# Patient Record
Sex: Female | Born: 1955 | Race: White | Hispanic: No | Marital: Married | Smoking: Former smoker
Health system: Southern US, Community
[De-identification: ages and names within clinical notes are randomized; demographics above are authoritative.]

## PROBLEM LIST (undated history)

## (undated) DIAGNOSIS — I639 Cerebral infarction, unspecified: Secondary | ICD-10-CM

## (undated) DIAGNOSIS — I10 Essential (primary) hypertension: Secondary | ICD-10-CM

## (undated) DIAGNOSIS — M1990 Unspecified osteoarthritis, unspecified site: Secondary | ICD-10-CM

## (undated) DIAGNOSIS — R768 Other specified abnormal immunological findings in serum: Secondary | ICD-10-CM

## (undated) DIAGNOSIS — D735 Infarction of spleen: Secondary | ICD-10-CM

## (undated) DIAGNOSIS — I5032 Chronic diastolic (congestive) heart failure: Secondary | ICD-10-CM

## (undated) DIAGNOSIS — J449 Chronic obstructive pulmonary disease, unspecified: Secondary | ICD-10-CM

## (undated) DIAGNOSIS — Z9889 Other specified postprocedural states: Secondary | ICD-10-CM

## (undated) DIAGNOSIS — D649 Anemia, unspecified: Secondary | ICD-10-CM

## (undated) DIAGNOSIS — J189 Pneumonia, unspecified organism: Secondary | ICD-10-CM

## (undated) DIAGNOSIS — E785 Hyperlipidemia, unspecified: Secondary | ICD-10-CM

## (undated) DIAGNOSIS — E119 Type 2 diabetes mellitus without complications: Secondary | ICD-10-CM

## (undated) DIAGNOSIS — R011 Cardiac murmur, unspecified: Secondary | ICD-10-CM

## (undated) DIAGNOSIS — I330 Acute and subacute infective endocarditis: Secondary | ICD-10-CM

## (undated) DIAGNOSIS — I2729 Other secondary pulmonary hypertension: Secondary | ICD-10-CM

## (undated) DIAGNOSIS — F410 Panic disorder [episodic paroxysmal anxiety] without agoraphobia: Secondary | ICD-10-CM

## (undated) DIAGNOSIS — G43909 Migraine, unspecified, not intractable, without status migrainosus: Secondary | ICD-10-CM

## (undated) DIAGNOSIS — Z8719 Personal history of other diseases of the digestive system: Secondary | ICD-10-CM

## (undated) DIAGNOSIS — I609 Nontraumatic subarachnoid hemorrhage, unspecified: Secondary | ICD-10-CM

---
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
## 1989-07-24 HISTORY — PX: DILATION AND CURETTAGE OF UTERUS: SHX78

## 1999-09-23 ENCOUNTER — Emergency Department (HOSPITAL_COMMUNITY): Admission: EM | Admit: 1999-09-23 | Discharge: 1999-09-23 | Payer: Self-pay | Admitting: Emergency Medicine

## 2000-07-22 ENCOUNTER — Emergency Department (HOSPITAL_COMMUNITY): Admission: EM | Admit: 2000-07-22 | Discharge: 2000-07-22 | Payer: Self-pay | Admitting: Emergency Medicine

## 2004-06-16 ENCOUNTER — Ambulatory Visit (HOSPITAL_COMMUNITY): Admission: RE | Admit: 2004-06-16 | Discharge: 2004-06-16 | Payer: Self-pay | Admitting: *Deleted

## 2004-06-16 ENCOUNTER — Encounter (INDEPENDENT_AMBULATORY_CARE_PROVIDER_SITE_OTHER): Payer: Self-pay | Admitting: Specialist

## 2005-09-24 ENCOUNTER — Ambulatory Visit: Payer: Self-pay | Admitting: Family Medicine

## 2005-09-24 ENCOUNTER — Inpatient Hospital Stay (HOSPITAL_COMMUNITY): Admission: EM | Admit: 2005-09-24 | Discharge: 2005-09-26 | Payer: Self-pay | Admitting: Emergency Medicine

## 2005-09-25 ENCOUNTER — Ambulatory Visit: Payer: Self-pay | Admitting: Internal Medicine

## 2009-11-22 ENCOUNTER — Emergency Department (HOSPITAL_COMMUNITY): Admission: EM | Admit: 2009-11-22 | Discharge: 2009-11-22 | Payer: Self-pay | Admitting: Emergency Medicine

## 2009-11-30 ENCOUNTER — Emergency Department (HOSPITAL_COMMUNITY): Admission: EM | Admit: 2009-11-30 | Discharge: 2009-11-30 | Payer: Self-pay | Admitting: Emergency Medicine

## 2010-04-17 ENCOUNTER — Emergency Department (HOSPITAL_COMMUNITY): Admission: EM | Admit: 2010-04-17 | Discharge: 2010-04-17 | Payer: Self-pay | Admitting: Emergency Medicine

## 2010-06-25 ENCOUNTER — Observation Stay (HOSPITAL_COMMUNITY): Admission: EM | Admit: 2010-06-25 | Discharge: 2010-06-25 | Payer: Self-pay | Admitting: Emergency Medicine

## 2010-08-09 ENCOUNTER — Emergency Department (HOSPITAL_COMMUNITY): Admission: EM | Admit: 2010-08-09 | Discharge: 2010-08-09 | Payer: Self-pay | Admitting: Emergency Medicine

## 2011-02-06 LAB — POCT I-STAT, CHEM 8
BUN: 11 mg/dL (ref 6–23)
Calcium, Ion: 1.2 mmol/L (ref 1.12–1.32)
Chloride: 103 mEq/L (ref 96–112)
Creatinine, Ser: 0.8 mg/dL (ref 0.4–1.2)
Glucose, Bld: 119 mg/dL — ABNORMAL HIGH (ref 70–99)
HCT: 50 % — ABNORMAL HIGH (ref 36.0–46.0)
Hemoglobin: 17 g/dL — ABNORMAL HIGH (ref 12.0–15.0)
Potassium: 3.6 mEq/L (ref 3.5–5.1)
Sodium: 139 mEq/L (ref 135–145)
TCO2: 28 mmol/L (ref 0–100)

## 2011-02-23 LAB — BASIC METABOLIC PANEL
BUN: 9 mg/dL (ref 6–23)
CO2: 28 mEq/L (ref 19–32)
Calcium: 9.5 mg/dL (ref 8.4–10.5)
Chloride: 102 mEq/L (ref 96–112)
Creatinine, Ser: 0.78 mg/dL (ref 0.4–1.2)
GFR calc Af Amer: >60 mL/min (ref >60)
GFR calc non Af Amer: >60 mL/min (ref >60)
Glucose, Bld: 103 mg/dL — ABNORMAL HIGH (ref 70–99)
Potassium: 4.2 mEq/L (ref 3.5–5.1)
Sodium: 137 mEq/L (ref 135–145)

## 2011-02-23 LAB — DIFFERENTIAL
Basophils Absolute: 0 10*3/uL (ref 0.0–0.1)
Basophils Relative: 0 % (ref 0–1)
Eosinophils Absolute: 0.1 10*3/uL (ref 0.0–0.7)
Eosinophils Relative: 1 % (ref 0–5)
Lymphocytes Relative: 8 % — ABNORMAL LOW (ref 12–46)
Lymphs Abs: 0.7 10*3/uL (ref 0.7–4.0)
Monocytes Absolute: 0.4 10*3/uL (ref 0.1–1.0)
Monocytes Relative: 4 % (ref 3–12)
Neutro Abs: 8.6 10*3/uL — ABNORMAL HIGH (ref 1.7–7.7)
Neutrophils Relative %: 87 % — ABNORMAL HIGH (ref 43–77)

## 2011-02-23 LAB — CBC
HCT: 47.4 % — ABNORMAL HIGH (ref 36.0–46.0)
Hemoglobin: 15.7 g/dL — ABNORMAL HIGH (ref 12.0–15.0)
MCHC: 33.1 g/dL (ref 30.0–36.0)
MCV: 84 fL (ref 78.0–100.0)
Platelets: 267 10*3/uL (ref 150–400)
RBC: 5.64 MIL/uL — ABNORMAL HIGH (ref 3.87–5.11)
RDW: 16.5 % — ABNORMAL HIGH (ref 11.5–15.5)
WBC: 9.9 10*3/uL (ref 4.0–10.5)

## 2011-04-10 NOTE — Op Note (Signed)
NAME:  Margaret [name], Margaret [name]                    ACCOUNT NO.:  [id number]   MEDICAL RECORD NO.:  [id number]                   PATIENT TYPE:  AMB   LOCATION:  SDC                                  FACILITY:  [facility]   PHYSICIAN:  Kendall West B. Earlene Plater, M.D.               DATE OF BIRTH:  1955-12-10   DATE OF PROCEDURE:  06/16/2004  DATE OF DISCHARGE:                                 OPERATIVE REPORT   PREOPERATIVE DIAGNOSIS:  Severe dysplasia with positive endocervical  curettage.   POSTOPERATIVE DIAGNOSIS:  Severe dysplasia with positive endocervical  curettage.   OPERATION PERFORMED:  Cold knife conization of the cervix.   SURGEON:  Chester Holstein. Earlene Plater, M.D.   ANESTHESIA:  LMA with 20 mL of 1% lidocaine with epinephrine paracervical  block.   SPECIMENS:  Cervical cone submitted in two pieces anterior portion labeled  12 o'clock, posterior portion labeled 6 o'clock.   ESTIMATED BLOOD LOSS:  50 mL.   COMPLICATIONS:  None.   INDICATIONS FOR PROCEDURE:  Patient with a recent abnormal Pap smear in the  office.  Found to have biopsy proven severe dysplasia and a positive  endocervical curettage.  Presents for cold knife conization given her age  and the positive ECC.   DESCRIPTION OF PROCEDURE:  The patient was taken to the operating room and  general laryngeal mask anesthesia obtained.  She was prepped in standard  fashion and the vagina was only gently prepped to not denude the cervix.  The bladder was emptied with a red rubber catheter.  Speculum inserted and  the paracervical block placed in standard fashion.  Stay sutures placed at 6  and 9 o'clock.  The uterus was sounded and found to be midposition and  normal size.  No gross lesions were noted.  The cervix was stained with  Lugol and a rim of abnormal staining noted at the transition zone.   The abnormally stained portion plus a margin of normal-appearing tissue was  excised with the knife.  There were two large nabothian cysts at the  cervix  which were entered during the cone and this made the cone specimen divide in  two separate pieces.  Each was excised sharply with the scissors and labeled  appropriately.   The cone bed was made hemostatic with ball cautery and a Gelfoam soaked in  Monsel's placed in the cone bed.  The cervix was hemostatic.  The patient  tolerated the procedure well.  There were no complications.  She was taken  to the recovery room, awake, alert and in stable condition.                                              Gerri Spore B. Earlene Plater, M.D.   WBD/MEDQ  D:  06/16/2004  T:  06/16/2004  Job:  [id number]

## 2011-04-10 NOTE — H&P (Signed)
[name], [name]          ACCOUNT NO.:  [id number]   MEDICAL RECORD NO.:  [id number]          PATIENT TYPE:  INP   LOCATION:  3709                         FACILITY:  [facility]   PHYSICIAN:  Asencion Partridge, M.D.     DATE OF BIRTH:  [birth year]   DATE OF ADMISSION:  09/24/2005  DATE OF DISCHARGE:                                HISTORY & PHYSICAL   CHIEF COMPLAINT:  Chest pain, shortness of breath, dyspnea on exertion.   HISTORY OF PRESENT ILLNESS:  The patient is a 55 year old female with the  medical history outlined below with increased shortness of breath over the  past few days. She had a 15-minute episode of chest pain that was  substernal. It radiated to her back and was associated with nausea. This  occurred last p.m. She has had an inability to lie flat at night. This has  been a recent change for her. She has had decreased exercise tolerance. She  went to Urgent Care and diagnosed with a new murmur and she came to [facility] for evaluation and treatment. After receiving Lasix  60 mg in the ED she had significant decrease in her shortness of breath. At  the time I evaluated her she was feeling significantly better.   PAST MEDICAL HISTORY:  1.  Hypertension.  2.  Hyperlipidemia.  3.  Obesity.  4.  Heartburn.  5.  History of anxiety attack.   SOCIAL HISTORY:  Tobacco:  One pack per day x9 years. No alcohol, no drugs.  She works as a Automotive engineer. She lives with her husband and daughter.   FAMILY HISTORY:  Her father died of a CVA. Her mother had a heart attack at  age 57 and died at age 46 of an MI. There is also a family history of  diabetes.   MEDICATIONS:  None.   ALLERGIES:  CODEINE causes nausea and itching.   REVIEW OF SYSTEMS:  No rash, no fevers, no chills, no blood in stool. She  does have heavy periods with cramping with menses. No vomiting. She does  have chest pain and shortness of breath as above. She has had no changes in  urination. She has had also a 2- to 3-year history of lower extremity edema.   PHYSICAL EXAMINATION:  VITAL SIGNS:  Noted at temperature 97.2, heart rate  80, respirations 21, blood pressure 165/94, O2 saturations 98% on 2 L. When  I examined her she was off oxygen.  GENERAL:  No apparent distress.  HEENT:  Normocephalic, atraumatic. Pupils equally round and reactive to  light. Mucous membranes are moist.  NECK:  No lymphadenopathy, no thyromegaly appreciated.  CARDIOVASCULAR:  Regular rate and rhythm. She does have a murmur at the left  sternal border over the mitral and aortic valve areas.  PULMONARY:  She has decreased breath sounds at the bases bilaterally. She  has no wheezes. She has coarse breath sounds.  ABDOMEN:  Soft, nontender, nondistended, positive bowel sounds, no rebound.  EXTREMITIES:  Show 2+ pulses with 1+ pedal edema.  SKIN:  No rash.   LABORATORY VALUES:  Initial cardiac  markers are negative. Hemoglobin is  noted at 9.3 with MCV of 65.3. INR is 1, PTT is 26. D-dimer is 1.24. BNP is  127. An i-STAT creatinine is 0.9. Other laboratory values are pending. EKG  shows normal sinus rhythm with no ST changes. CT shows no PE. She has  bilateral pleural effusions with mild atelectasis and mild pulmonary edema.  Chest x-ray shows borderline cardiomegaly.   ASSESSMENT AND PLAN:  The patient is a 55 year old female with the following  problems:  1.  Chest pain/shortness of breath. This of high concern given her history      for cardiac disease. She is obese, she has hypertension, she has a      positive family history for MI and CVA. She also has hyperlipidemia and      she smokes. Therefore, will admit her and rule out an MI with enzymes      and serial EKGs. Shortness of breath likely due to the pleural effusions      which is likely due to the cardiac issue. I will treat this with Lasix.      Will evaluate her lipids and A1c and start aspirin. Will admit her to a       telemetry bed.  2.  Hypertension. Will start hydrochlorothiazide. Depending on A1c, she may      be a candidate for an ACE. Of note, her creatinine is okay on the i-STAT      listed above.  3.  New murmur. Will check an echocardiogram and follow this. She does not      appear to be actively decompensating. It is stable for this not to be      emergently worked up; this can wait until tomorrow.  4.  Electrolytes. Will follow when available.  5.  Deep venous thrombosis prophylaxis with Lovenox.  6.  Fluids, electrolytes, and nutrition. Begin a diabetic diet.  7.  Tobacco. Will replace her nicotine deficit with patch and encourage      cessation.  8.  Disposition. Pending #1.      Margaret [name], M.D.    ______________________________  Asencion Partridge, M.D.    GSD/MEDQ  D:  09/24/2005  T:  09/25/2005  Job:  [id number]   cc:   Urgent Care at [facility]

## 2011-04-10 NOTE — Discharge Summary (Signed)
NAME:  Margaret [name], Margaret [name]             ACCOUNT NO.:  [facility]   MEDICAL RECORD NO.:  [id number]          PATIENT TYPE:  INP   LOCATION:  3709                         FACILITY:  [facility]   PHYSICIAN:  Asencion Partridge, M.D.     DATE OF BIRTH:  December 28, 1955   DATE OF ADMISSION:  09/24/2005  DATE OF DISCHARGE:  09/26/2005                                 DISCHARGE SUMMARY   PRIMARY CARE PHYSICIAN:  Jonita Albee, M.D.   DISCHARGE DIAGNOSES:  1.  Chest pain. Rule out myocardial infarction.  2.  Hypertension.  3.  Hyperlipidemia.  4.  Tobacco abuse.  5.  Iron-deficiency anemia.   PROCEDURE:  1.  Chest x-ray dated September 24, 2005, showed borderline cardiomegaly with      diffuse interstitial and bibasilar alveolar opacity and small bilateral      pleural effusions. Also shows a fullness in the right hilum. Adenopathy      is not excluded. Recommend follow-up imaging in the future after the      resolution of her acute symptoms.  2.  CT angiogram of the chest, dated September 24, 2005, showed bilateral      pleural effusions with bibasilar atelectasis and probable edema. There      is no evidence for pulmonary emboli. There is borderline mediastinal and      hilar lymph nodes.   LABORATORY DATA:  Notable laboratories are as follows: Hemoglobin of 10.5,  MCV 65.3, and an RDW of 18.4. Reticulocyte percentage of 2.5 with an  absolute reticulocyte count of 122.8. Iron studies are as follows: Total  iron is 17 and low, total iron binding capacity 459, percent saturation is  4% and low. Ferritin is 13.   Fasting lipid panel shows triglycerides of 119, HDL 35, LDL 113.   Hemoglobin A1C is 5.7. BNP on admission was 127.8 and mildly elevated.   Point of care enzymes were negative times in the emergency department and  then regular cardiac enzymes were as follows: Creatine kinase was 163, 126,  132; CK 2.5, 2.7, 1.7; troponin 0.01, 0.02, 0.02 and stable.   The patient had normal liver function tests in  the hospital and a creatinine  of 0.8 at discharge.   D-dimer in the hospital was 1.24 on admission and this was evaluated with a  CT angiogram of the chest which was negative.   HISTORY AND PHYSICAL:  Please see a copy of the dictated history and  physical on the chart for details, but in short the patient is a 55 year old  Caucasian female with a past medical history for hypertension,  hyperlipidemia, obesity, tobacco abuse, and also a family history of a  mother with a heart attack at age 71 who died of a myocardial infarction at  age 91, who presented to the emergency department  with atypical chest pain  and shortness of breath. Workup included CT angiogram to rule out pulmonary  emboli and a chest x-ray which showed bilateral pleural effusions. The  patient was admitted to the hospital to complete rule out for myocardial  infarction and also to pursue a 2-D  echo of the chest to evaluate ejection  fraction given the fact that she had signs of pulmonary edema and bilateral  pleural effusion.   HOSPITAL COURSE:   PROBLEM #1:  Chest pain. Rule out myocardial infarction. The patient was  admitted to the hospital and had serial EKGs and cardiac enzymes q.8h. times  three. Cardiac enzymes were negative times three and EKG showed no acute  changes. An acute myocardial infarction was therefore ruled out. The patient  was pain free throughout the hospitalization and was pain free on the day of  discharge and asking to home. Given the patient's significant risk factors,  it is felt that we would appreciate Dr. Perrin Maltese, the [facility], would schedule the patient for an outpatient cardiac workup  such as a stress Cardiolite. I have advised the patient to take a baby  aspirin every day and to also counsel her as to smoking cessation.   PROBLEM #2:  Pulmonary edema. The patient was diuresed effectively with  Lasix 40 mg daily. This was continued throughout the  hospitalization and the  patient maintained a stable body weight and was asymptomatic on the 40 mg of  Lasix throughout the hospitalization. This will be continued at the time of  discharge. Would appreciate the physicians at urgent care will follow up her  BNP at her follow-up appointment to assess her potassium and also her  creatinine. I would also appreciate if they would determine whether or not  the patient needs to be continued on the Lasix long-term. I would also  appreciate if the physicians at [facility] would also follow up the  results of the 2-D echocardiogram of the heart. The patient on admission was  noted to have a 3/6 systolic ejection murmur heard best over the base. The  patient states she had not been informed of this murmur in the past. An  echocardiogram was obtained to: (1) Evaluate her ejection fraction given her  bilateral pleural effusions and pulmonary edema, and (2) to evaluate the  etiology of this murmur. It is our suspicion that the murmur is likely  related to her anemia as she was found to have a microcytic anemia on  admission. Depending on the echocardiogram the patient may also benefit from  an ACE inhibitor in the future, but will leave that to the discretion of Dr.  Perrin Maltese at his outpatient follow-up.   PROBLEM #3:  Tobacco abuse. The patient was counseled as to tobacco  cessation and will start on a nicotine which she was given for a  prescription for at discharge.   PROBLEM #4:  Iron-deficiency anemia. The patient had a microcytic anemia  with iron studies consistent with iron-deficiency anemia. She was discharged  home on iron sulfate 325 mg one p.o. b.i.d. and instructed to follow up with  her regular doctor.   PROBLEM #5:  Hypertension. The patient was continued on her  hydrochlorothiazide if discharged. Her blood pressures were in the range of  122 to 136 over 84 to 85. May consider adding an ACE inhibitor at the outpatient followup  depending on the results of the 2-D echocardiogram of  the heart.   PROBLEM #6:  Hyperlipidemia. The patient was discharged home on Zocor 20 mg  one p.o. q.h.s. her goal LDL will be less than 130 given her family history  of coronary artery disease and also her multiple risk factors. Currently her  LDL is 113 and at goal. Her triglycerides  are also at goal. Her HDL of 35  could stand improvement. Consider adding niacin to the statin as an  outpatient and will leave that to the discretion of Dr. Perrin Maltese.   DISCHARGE MEDICATIONS:  1.  Aspirin 81 mg p.o. daily.  2.  Hydrochlorothiazide 25 mg one p.o. daily.  3.  Nicotine patch 14 mg apply one patch daily.  4.  Lasix 40 mg one p.o. daily.  5.  Iron sulfate 325 mg one p.o. b.i.d.  6.  Zocor 20 mg one p.o. q.h.s.   FOLLOWUP INSTRUCTIONS:  The patient is instructed to call [facility]  and to arrange a follow-up appointment on Tuesday or Wednesday, November 8th  or 9th to discuss her findings with Dr. Katrinka Blazing. Would appreciate at that time  Dr. Perrin Maltese or Dr. Katrinka Blazing follow up the results of the 2-D echocardiogram,  schedule the patient for an outpatient stress Cardiolite if they deem  appropriate, and also determine whether or not the patient needs to maintain  Lasix long-term.      Broadus John T. Pamalee Leyden, MD    ______________________________  Asencion Partridge, M.D.    WTP/MEDQ  D:  09/26/2005  T:  09/26/2005  Job:  [id number]   cc:   Jonita Albee, M.D.  Fax: [phone]

## 2012-04-20 ENCOUNTER — Encounter (HOSPITAL_COMMUNITY): Payer: Self-pay | Admitting: Emergency Medicine

## 2012-04-20 ENCOUNTER — Emergency Department (HOSPITAL_COMMUNITY): Payer: Self-pay

## 2012-04-20 ENCOUNTER — Inpatient Hospital Stay (HOSPITAL_COMMUNITY)
Admission: EM | Admit: 2012-04-20 | Discharge: 2012-04-22 | DRG: 191 | Disposition: A | Discharge status: Home / Self Care | Payer: Self-pay | Source: Ambulatory Visit | Attending: Internal Medicine | Admitting: Internal Medicine

## 2012-04-20 DIAGNOSIS — F410 Panic disorder [episodic paroxysmal anxiety] without agoraphobia: Secondary | ICD-10-CM

## 2012-04-20 DIAGNOSIS — I10 Essential (primary) hypertension: Secondary | ICD-10-CM

## 2012-04-20 DIAGNOSIS — E119 Type 2 diabetes mellitus without complications: Secondary | ICD-10-CM | POA: Diagnosis present

## 2012-04-20 DIAGNOSIS — E785 Hyperlipidemia, unspecified: Secondary | ICD-10-CM | POA: Diagnosis present

## 2012-04-20 DIAGNOSIS — I509 Heart failure, unspecified: Secondary | ICD-10-CM

## 2012-04-20 DIAGNOSIS — E669 Obesity, unspecified: Secondary | ICD-10-CM | POA: Diagnosis present

## 2012-04-20 DIAGNOSIS — Z6841 Body Mass Index (BMI) 40.0 and over, adult: Secondary | ICD-10-CM

## 2012-04-20 DIAGNOSIS — J441 Chronic obstructive pulmonary disease with (acute) exacerbation: Principal | ICD-10-CM | POA: Diagnosis present

## 2012-04-20 DIAGNOSIS — F17200 Nicotine dependence, unspecified, uncomplicated: Secondary | ICD-10-CM | POA: Diagnosis present

## 2012-04-20 DIAGNOSIS — Z7982 Long term (current) use of aspirin: Secondary | ICD-10-CM

## 2012-04-20 DIAGNOSIS — R0902 Hypoxemia: Secondary | ICD-10-CM

## 2012-04-20 DIAGNOSIS — J438 Other emphysema: Secondary | ICD-10-CM

## 2012-04-20 DIAGNOSIS — Z79899 Other long term (current) drug therapy: Secondary | ICD-10-CM

## 2012-04-20 DIAGNOSIS — R062 Wheezing: Secondary | ICD-10-CM

## 2012-04-20 DIAGNOSIS — J449 Chronic obstructive pulmonary disease, unspecified: Secondary | ICD-10-CM

## 2012-04-20 DIAGNOSIS — R0602 Shortness of breath: Secondary | ICD-10-CM

## 2012-04-20 DIAGNOSIS — R609 Edema, unspecified: Secondary | ICD-10-CM

## 2012-04-20 HISTORY — DX: Hyperlipidemia, unspecified: E78.5

## 2012-04-20 HISTORY — DX: Panic disorder (episodic paroxysmal anxiety): F41.0

## 2012-04-20 HISTORY — DX: Anemia, unspecified: D64.9

## 2012-04-20 HISTORY — DX: Migraine, unspecified, not intractable, without status migrainosus: G43.909

## 2012-04-20 HISTORY — DX: Type 2 diabetes mellitus without complications: E11.9

## 2012-04-20 HISTORY — DX: Cardiac murmur, unspecified: R01.1

## 2012-04-20 HISTORY — DX: Chronic obstructive pulmonary disease, unspecified: J44.9

## 2012-04-20 HISTORY — DX: Personal history of other diseases of the digestive system: Z87.19

## 2012-04-20 HISTORY — DX: Essential (primary) hypertension: I10

## 2012-04-20 LAB — CBC
HCT: 49.6 % — ABNORMAL HIGH (ref 36.0–46.0)
Hemoglobin: 16.7 g/dL — ABNORMAL HIGH (ref 12.0–15.0)
MCH: 31.7 pg (ref 26.0–34.0)
MCHC: 33.7 g/dL (ref 30.0–36.0)
MCV: 94.1 fL (ref 78.0–100.0)
Platelets: 219 10*3/uL (ref 150–400)
RBC: 5.27 MIL/uL — ABNORMAL HIGH (ref 3.87–5.11)
RDW: 14.9 % (ref 11.5–15.5)

## 2012-04-20 LAB — BASIC METABOLIC PANEL
BUN: 12 mg/dL (ref 6–23)
CO2: 26 mEq/L (ref 19–32)
Calcium: 9.3 mg/dL (ref 8.4–10.5)
Chloride: 101 mEq/L (ref 96–112)
Creatinine, Ser: 0.81 mg/dL (ref 0.50–1.10)
GFR calc Af Amer: >90 mL/min (ref >90)
GFR calc non Af Amer: 80 mL/min — ABNORMAL LOW (ref >90)
Glucose, Bld: 127 mg/dL — ABNORMAL HIGH (ref 70–99)
Sodium: 136 mEq/L (ref 135–145)

## 2012-04-20 LAB — DIFFERENTIAL
Basophils Absolute: 0 10*3/uL (ref 0.0–0.1)
Basophils Relative: 0 % (ref 0–1)
Eosinophils Absolute: 0.2 10*3/uL (ref 0.0–0.7)
Eosinophils Relative: 2 % (ref 0–5)
Lymphocytes Relative: 10 % — ABNORMAL LOW (ref 12–46)
Lymphs Abs: 0.9 10*3/uL (ref 0.7–4.0)
Monocytes Absolute: 0.3 10*3/uL (ref 0.1–1.0)
Monocytes Relative: 4 % (ref 3–12)
Neutro Abs: 8 10*3/uL — ABNORMAL HIGH (ref 1.7–7.7)
Neutrophils Relative %: 85 % — ABNORMAL HIGH (ref 43–77)

## 2012-04-20 LAB — PRO B NATRIURETIC PEPTIDE: Pro B Natriuretic peptide (BNP): 416.2 pg/mL — ABNORMAL HIGH (ref 0–125)

## 2012-04-20 MED ORDER — IPRATROPIUM BROMIDE 0.02 % IN SOLN
0.5000 mg | Freq: Once | RESPIRATORY_TRACT | Status: AC
  Administered 2012-04-20: 0.5 mg via RESPIRATORY_TRACT

## 2012-04-20 MED ORDER — ASPIRIN 81 MG PO CHEW
81.0000 mg | CHEWABLE_TABLET | Freq: Every day | ORAL | Status: DC
  Administered 2012-04-20 – 2012-04-22 (×3): 81 mg via ORAL
  Filled 2012-04-20 (×3): qty 1

## 2012-04-20 MED ORDER — IPRATROPIUM BROMIDE 0.02 % IN SOLN
0.5000 mg | Freq: Once | RESPIRATORY_TRACT | Status: AC
  Administered 2012-04-20: 0.5 mg via RESPIRATORY_TRACT
  Filled 2012-04-20: qty 2.5

## 2012-04-20 MED ORDER — FUROSEMIDE 10 MG/ML IJ SOLN
40.0000 mg | Freq: Once | INTRAMUSCULAR | Status: AC
  Administered 2012-04-20: 40 mg via INTRAVENOUS
  Filled 2012-04-20: qty 4

## 2012-04-20 MED ORDER — PREDNISONE 20 MG PO TABS
60.0000 mg | ORAL_TABLET | Freq: Once | ORAL | Status: AC
  Administered 2012-04-20: 60 mg via ORAL
  Filled 2012-04-20: qty 3

## 2012-04-20 MED ORDER — IPRATROPIUM BROMIDE 0.02 % IN SOLN
RESPIRATORY_TRACT | Status: AC
  Administered 2012-04-20: 0.5 mg via RESPIRATORY_TRACT
  Filled 2012-04-20: qty 2.5

## 2012-04-20 MED ORDER — GUAIFENESIN-DM 100-10 MG/5ML PO SYRP
5.0000 mL | ORAL_SOLUTION | ORAL | Status: DC | PRN
  Administered 2012-04-21: 5 mL via ORAL
  Filled 2012-04-20 (×2): qty 5

## 2012-04-20 MED ORDER — ALBUTEROL SULFATE (5 MG/ML) 0.5% IN NEBU
5.0000 mg | INHALATION_SOLUTION | Freq: Once | RESPIRATORY_TRACT | Status: AC
  Administered 2012-04-20: 5 mg via RESPIRATORY_TRACT

## 2012-04-20 MED ORDER — FUROSEMIDE 40 MG PO TABS
40.0000 mg | ORAL_TABLET | Freq: Every day | ORAL | Status: DC
  Administered 2012-04-21 – 2012-04-22 (×2): 40 mg via ORAL
  Filled 2012-04-20 (×3): qty 1

## 2012-04-20 MED ORDER — ALBUTEROL SULFATE (5 MG/ML) 0.5% IN NEBU
10.0000 mg | INHALATION_SOLUTION | Freq: Once | RESPIRATORY_TRACT | Status: AC
  Administered 2012-04-20: 10 mg via RESPIRATORY_TRACT
  Filled 2012-04-20: qty 0.5

## 2012-04-20 MED ORDER — FUROSEMIDE 20 MG PO TABS
20.0000 mg | ORAL_TABLET | Freq: Every day | ORAL | Status: DC
  Administered 2012-04-20 – 2012-04-22 (×3): 20 mg via ORAL
  Filled 2012-04-20 (×3): qty 1

## 2012-04-20 MED ORDER — OMEGA-3-ACID ETHYL ESTERS 1 G PO CAPS
1.0000 g | ORAL_CAPSULE | Freq: Every day | ORAL | Status: DC
  Administered 2012-04-20 – 2012-04-22 (×3): 1 g via ORAL
  Filled 2012-04-20 (×3): qty 1

## 2012-04-20 MED ORDER — ALBUTEROL SULFATE (5 MG/ML) 0.5% IN NEBU
5.0000 mg | INHALATION_SOLUTION | RESPIRATORY_TRACT | Status: DC | PRN
  Administered 2012-04-20: 5 mg via RESPIRATORY_TRACT
  Filled 2012-04-20: qty 1

## 2012-04-20 MED ORDER — OMEGA-3 FATTY ACIDS 1000 MG PO CAPS: 1.0000 g | ORAL_CAPSULE | Freq: Every day | ORAL | Status: DC

## 2012-04-20 MED ORDER — ALBUTEROL SULFATE (5 MG/ML) 0.5% IN NEBU
2.5000 mg | INHALATION_SOLUTION | Freq: Four times a day (QID) | RESPIRATORY_TRACT | Status: DC
  Administered 2012-04-20 – 2012-04-22 (×7): 2.5 mg via RESPIRATORY_TRACT
  Filled 2012-04-20 (×7): qty 0.5

## 2012-04-20 MED ORDER — FUROSEMIDE 20 MG PO TABS: 20.0000 mg | ORAL_TABLET | Freq: Two times a day (BID) | ORAL | Status: DC

## 2012-04-20 MED ORDER — ATENOLOL 50 MG PO TABS
50.0000 mg | ORAL_TABLET | Freq: Every day | ORAL | Status: DC
  Administered 2012-04-20: 50 mg via ORAL
  Filled 2012-04-20 (×2): qty 1

## 2012-04-20 MED ORDER — AZITHROMYCIN 500 MG PO TABS
500.0000 mg | ORAL_TABLET | Freq: Every day | ORAL | Status: DC
  Administered 2012-04-20 – 2012-04-22 (×3): 500 mg via ORAL
  Filled 2012-04-20: qty 2
  Filled 2012-04-20 (×2): qty 1

## 2012-04-20 MED ORDER — TIOTROPIUM BROMIDE MONOHYDRATE 18 MCG IN CAPS
18.0000 ug | ORAL_CAPSULE | Freq: Every day | RESPIRATORY_TRACT | Status: DC
  Administered 2012-04-21 – 2012-04-22 (×2): 18 ug via RESPIRATORY_TRACT
  Filled 2012-04-20: qty 5

## 2012-04-20 MED ORDER — CLONIDINE HCL 0.1 MG PO TABS: 0.1000 mg | ORAL_TABLET | Freq: Every day | ORAL | Status: DC

## 2012-04-20 MED ORDER — ACETAMINOPHEN 325 MG PO TABS: 650.0000 mg | ORAL_TABLET | ORAL | Status: DC | PRN

## 2012-04-20 MED ORDER — ALBUTEROL SULFATE (5 MG/ML) 0.5% IN NEBU
5.0000 mg | INHALATION_SOLUTION | Freq: Once | RESPIRATORY_TRACT | Status: AC
  Administered 2012-04-20: 5 mg via RESPIRATORY_TRACT
  Filled 2012-04-20: qty 1

## 2012-04-20 MED ORDER — ALBUTEROL SULFATE (5 MG/ML) 0.5% IN NEBU
INHALATION_SOLUTION | RESPIRATORY_TRACT | Status: AC
  Administered 2012-04-20: 5 mg via RESPIRATORY_TRACT
  Filled 2012-04-20: qty 1.5

## 2012-04-20 MED ORDER — PREDNISONE 50 MG PO TABS
60.0000 mg | ORAL_TABLET | Freq: Every day | ORAL | Status: DC
  Administered 2012-04-20: 60 mg via ORAL
  Filled 2012-04-20: qty 3
  Filled 2012-04-20 (×2): qty 1

## 2012-04-20 NOTE — ED Notes (Signed)
Pt stated that earlier today she begin having a difficult time breathing, She felt like she could not catch her breath. She stated that she was having midsternal CP and bilateral rib pain during this time. EMS came out the first time and they gave her breathing treatment. She stated that at that time she did not go to the ED. A couple of hours later, she had another attack with the same signs and symptoms. She called EMS  And they brought her to the ED.  No radiation of pain. No n/v. Diaphoresis present. Currently pt states that she feels better. No respiratory distress. No CP or rib pain. Will continue to monitor.

## 2012-04-20 NOTE — ED Notes (Signed)
Respirations even & unlabored, no distress after some rest. Denies SOB. Increase in POX to 95%

## 2012-04-20 NOTE — ED Provider Notes (Signed)
Medical screening examination/treatment/procedure(s) were performed by non-physician practitioner and as supervising physician I was immediately available for consultation/collaboration.  Cheri Guppy, MD 04/20/12 [phone]

## 2012-04-20 NOTE — H&P (Signed)
. History and Physical  Margaret [name] RUE:[id number] DOB: 03-30-1956 DOA: 04/20/2012  Referring physician: PCP: Quentin Mulling, MD, MD   Chief Complaint: SOB  HPI:  56 yr old female was well till /29 afternoon and had window open.  She thinks the pollen caused there to start having cough/sneeze.  Took an allergy medicine and became sob with laboured breathing and felt exhausted and went to Dr. Charlane Ferretti office for evaluation and as he was backed up, she went HOme without being seen and then awoke 21:00 last pmand couldn;t breath.  It was laboured, she was gasping for air and this giot worse and worse and her husband called EMS and she was given O2 and had a panic attack apparently from this and she calmed a little and stopped hyperventilating and felt better.   EMS left and about 3 hours later she had increasing SOB once again and EMS came out-She was given breathing Rx on way to Hospital and also given Oxygen and another breathing Rx and had 2 more here and still SOB with some desats so referred for admit.  Has been told she has asthma-had this as an adult-she has never had Pulmonology eval Has some seasonal allergies   Has rib soreness, no CP.Was assosc with = some heaviness in chest earlier which relieved itself with breathing Rx-no radiation to arm or neck, no h/o HA, Has had some LE swelling which is constant-not mor ethna usual-has gained some water weight , has h/o heart murmu.  No fever or chills that she can defintively mention-feels subjectively warm, +headache today,no blurred vision or double cvision, no dyuria, no dark stool or tyarry stool, no abd pain, no rash, Has a h/o fellow employees who have been sick-no sick children in house hold  Chart Review:  See above  H/o Pulm edema-had an echo 2006 admit but no report in Epic  H/o tobacco abuse  H/o iron def anemia  HLD  HTN stage 2  Review of Systems:  See above  Past Medical History  Diagnosis Date  .  Hypertension   . Diabetes mellitus     History reviewed. No pertinent past surgical history.  Social History:  reports that she has been smoking.  She does not have any smokeless tobacco history on file. She reports that she does not drink alcohol or use illicit drugs.  Allergies  Allergen Reactions  . Codeine Hives    History reviewed. No pertinent family history.   Prior to Admission medications   Medication Sig Start Date End Date Taking? Authorizing Provider  albuterol (PROVENTIL HFA;VENTOLIN HFA) 108 (90 BASE) MCG/ACT inhaler Inhale into the lungs every 6 (six) hours as needed. For wheezing and shortness of breathe   Yes Historical Provider, MD  aspirin 81 MG chewable tablet Chew 81 mg by mouth daily.   Yes Historical Provider, MD  atenolol (TENORMIN) 50 MG tablet Take 50 mg by mouth daily.   Yes Historical Provider, MD  cloNIDine (CATAPRES) 0.1 MG tablet Take 0.1 mg by mouth daily.   Yes Historical Provider, MD  fish oil-omega-3 fatty acids 1000 MG capsule Take 1 g by mouth daily.   Yes Historical Provider, MD  furosemide (LASIX) 40 MG tablet Take 20-40 mg by mouth 2 (two) times daily. Take one tablet in the morning and one-half a tablet in the evening   Yes Historical Provider, MD   Physical Exam: Filed Vitals:   04/20/12 1147 04/20/12 1300 04/20/12 1417 04/20/12 1418  BP:  147/65  Pulse:  78    Temp:      TempSrc:      Resp:  22    SpO2: 96% 96% 92% 83%     General:  Alert pleasant CF obese, no pallor/ict  Eyes: no ict/pallor  ENT: no lymphad, ear TM's neg, no turbinate swelling  Neck: soft, supple, no JVD no bruit  Cardiovascular: s1 s2 no m/r/g-tele NSR  Respiratory: Exp wheezes heard, no TVR/TVF  Abdomen: obwese, nt/nd  Skin: no rash, grade 2 LE edema  Musculoskeletal: rom normal  Psychiatric: euthymic  Neurologic: intact   Labs on Admission:  Basic Metabolic Panel:  Lab 04/20/12 4098  NA 136  K 3.9  CL 101  CO2 26  GLUCOSE 127*  BUN 12   CREATININE 0.81  CALCIUM 9.3  MG --  PHOS --    Liver Function Tests: No results found for this basename: AST:5,ALT:5,ALKPHOS:5,BILITOT:5,PROT:5,ALBUMIN:5 in the last 168 hours No results found for this basename: LIPASE:5,AMYLASE:5 in the last 168 hours No results found for this basename: AMMONIA:5 in the last 168 hours  CBC:  Lab 04/20/12 0344  WBC 9.5  NEUTROABS 8.0*  HGB 16.7*  HCT 49.6*  MCV 94.1  PLT 219    Cardiac Enzymes: No results found for this basename: CKTOTAL:5,CKMB:5,CKMBINDEX:5,TROPONINI:5 in the last 168 hours  Troponin (Point of Care Test) No results found for this basename: TROPIPOC in the last 72 hours  BNP (last 3 results)  Basename 04/20/12 0358  PROBNP 416.2*    CBG: No results found for this basename: GLUCAP:5 in the last 168 hours   Radiological Exams on Admission: Dg Chest Port 1 View  04/20/2012  *RADIOLOGY REPORT*  Clinical Data: Shortness of breath, cough and chest tightness.  PORTABLE CHEST - 1 VIEW  Comparison: 06/24/2010  Findings: Cardiac enlargement with pulmonary vascular congestion and interstitial changes suggesting interstitial edema.  No blunting of costophrenic angles.  No focal consolidation.  No pneumothorax.  IMPRESSION: Cardiac enlargement with pulmonary vascular congestion and interstitial edema.  Original Report Authenticated By: Marlon Pel, M.D.    EKG: Independently reviewed. NSR with no sig change from prior   Active Problems:  * No active hospital problems. *     Assessment/Plan 1. LIkely COPD exacerbation> CHF exacerbation-continue Albuterol, Tiotropium and add Azithromycin 5000 qd x 5 days.  Maiotain sats over 90.  Will need outpatient ECHO to delineate EF-agree CXR suggestive of some mild CHF, but not in acute extrmis and will wean attempt to wean O2 in am 2. DM-diet controlled-expect this to worsen.  Add insulin coverage if CBG >200 3. Htn-Continue atenolol and Clonidine  Code Status: Full Family  Communication: none at bedside Disposition Plan: OBs team 5, Deke Tilghman  Pleas Koch, MD  [facility] Pager [phone]  If 8PM-8AM, please contact floor/night-coverage at [email or web address], password [facility] 04/20/2012, 2:44 PM

## 2012-04-20 NOTE — ED Notes (Signed)
Report given to Monica RN

## 2012-04-20 NOTE — ED Provider Notes (Signed)
Patient with a hx sig for HTN, DM, &COPD was placed in CDU on wheezing protocol by Dr. Bebe Shaggy. Patient care resumed from [facility] .  Patient is here for breathing treatments and has received 2 treatments and PO prednisone. Patient re-evaluated and is resting comfortable, VSS, with no new complaints or concerns at this time. Plan per previous provider is to observe pts breathing and asses O2 sat after a 2-4 hours in the CDU to decide on disposition. On exam: hemodynamically stable, NAD, heart w/ RRR, lungs w expiratory wheezing & crackles bilaterally, pt in mild respiratory distress w open mouth breathing, able to speak full scentences, not at baseline , Chest & abd non-tender, no peripheral edema or calf tenderness. Pt is to get albuterol tx q 2 hours per previous providers orders. Xray reviewed and there is evidence of mild vascular congestion. Discussed giving lasix w Bebe Shaggy who believes SOB to be associated more with COPD/asthma exacerbations.   9:00 AM Pt still mouth breathing after steroids and nebs x2. She states she does not want to be admitted to hospital and asks to re-asses her again in some time.   10:45 AM No improvement of lungs on ausculation. Pt still against admit. Will order hour long treatment and re-asses.   12:00 PM Hour long started.   1:25 PM  Pt sitting eating lunch comfortably. Lungs greatly improved with only mild wheezing after hour long treatment. Pt to be ambulated to check for hypoxia. Goal pending disposition is for patient to keep O2 sat >90 on RA while ambulating   2:14 PM  Pulse ox dropped to low/mid 80s on ambulation. Paged unassigned to admit. Pt states she does not have a PCP. 40 Lasix given  2:38 PM  [facility] to see in CDU and admit   Jaci Carrel, PA-C 04/20/12 1438

## 2012-04-20 NOTE — Progress Notes (Signed)
Pt admitted to unit 5500 after presenting to the ED with shortness of breath and cough.  Pt is alert and oriented x4 and is from home with husband.  Pt oriented to the unit and call bell system.  Fall safety plan explained to pt.  Pt has dyspnea with exertion and is on 1L O2 by nasal canula.  Skin is intact.

## 2012-04-20 NOTE — ED Notes (Signed)
Admitting MD at bedside.

## 2012-04-20 NOTE — ED Provider Notes (Signed)
History     CSN: [id number]  Arrival date & time 04/20/12  0243   First MD Initiated Contact with Patient 04/20/12 0354      Chief Complaint  Patient presents with  . Shortness of Breath     Patient is a 56 y.o. female presenting with shortness of breath. The history is provided by the patient.  Shortness of Breath  The current episode started yesterday. The onset was gradual. The problem occurs frequently. The problem has been gradually worsening. The problem is moderate. The symptoms are relieved by beta-agonist inhalers. The symptoms are aggravated by nothing. Associated symptoms include chest pressure, cough, shortness of breath and wheezing. Pertinent negatives include no fever. Recently, medical care has been given by EMS. Services received include medications given.  Pt reports two separate episodes of SOB tonight She reports initial episode at 10pm, she felt SOB, had cough and wheeze.  EMS responded and provided nebs/oxygen and she felt improved.  She stayed at home, and later in the evening she felt SOB/cough again.  She reports mild chest pressure as well.  She reports similar episodes in the past.   She is a smoker She had experienced cough/wheeze recently and was trying to see her PCP for this She is not on home oxygen She denies h/o intubation  Past Medical History  Diagnosis Date  . Hypertension   . Diabetes mellitus     History reviewed. No pertinent past surgical history.  Family history - positive for asthma   History  Substance Use Topics  . Smoking status: Current Everyday Smoker  . Smokeless tobacco: Not on file  . Alcohol Use: No    OB History    Grav Para Term Preterm Abortions TAB SAB Ect Mult Living                  Review of Systems  Constitutional: Negative for fever.  Respiratory: Positive for cough, shortness of breath and wheezing.   Gastrointestinal: Negative for vomiting.  Psychiatric/Behavioral: The patient is nervous/anxious.   All  other systems reviewed and are negative.    Allergies  Codeine  Home Medications   Current Outpatient Rx  Name Route Sig Dispense Refill  . ALBUTEROL SULFATE HFA 108 (90 BASE) MCG/ACT IN AERS Inhalation Inhale into the lungs every 6 (six) hours as needed. For wheezing and shortness of breathe    . ASPIRIN 81 MG PO CHEW Oral Chew 81 mg by mouth daily.    . ATENOLOL 50 MG PO TABS Oral Take 50 mg by mouth daily.    Marland Kitchen CLONIDINE HCL 0.1 MG PO TABS Oral Take 0.1 mg by mouth daily.    . OMEGA-3 FATTY ACIDS 1000 MG PO CAPS Oral Take 1 g by mouth daily.    . FUROSEMIDE 40 MG PO TABS Oral Take 20-40 mg by mouth 2 (two) times daily. Take one tablet in the morning and one-half a tablet in the evening      BP 134/62  Temp(Src) 98.4 F (36.9 C) (Oral)  Resp 16  SpO2 97% BP 108/54  Pulse 72  Temp(Src) 98.4 F (36.9 C) (Oral)  Resp 26  SpO2 96%   Physical Exam CONSTITUTIONAL: Well developed/well nourished HEAD AND FACE: Normocephalic/atraumatic EYES: EOMI/PERRL ENMT: Mucous membranes moist NECK: supple no meningeal signs SPINE:entire spine nontender CV: S1/S2 noted, no murmurs/rubs/gallops noted LUNGS: coarse wheeze noted bilaterally with mild tachypnea, no rales noted ABDOMEN: soft, nontender, no rebound or guarding, obese GU:no cva tenderness NEURO: Pt  is awake/alert, moves all extremitiesx4 EXTREMITIES: pulses normal, full ROM, symmetric pitting edema noted to bilateral LE (pt reports chronic) SKIN: warm, color normal PSYCH: no abnormalities of mood noted  ED Course  Procedures   Labs Reviewed  CBC - Abnormal; Notable for the following:    RBC 5.27 (*)    Hemoglobin 16.7 (*)    HCT 49.6 (*)    All other components within normal limits  DIFFERENTIAL - Abnormal; Notable for the following:    Neutrophils Relative 85 (*)    Neutro Abs 8.0 (*)    Lymphocytes Relative 10 (*)    All other components within normal limits  BASIC METABOLIC PANEL  PRO B NATRIURETIC PEPTIDE    Dg Chest Port 1 View  04/20/2012  *RADIOLOGY REPORT*  Clinical Data: Shortness of breath, cough and chest tightness.  PORTABLE CHEST - 1 VIEW  Comparison: 06/24/2010  Findings: Cardiac enlargement with pulmonary vascular congestion and interstitial changes suggesting interstitial edema.  No blunting of costophrenic angles.  No focal consolidation.  No pneumothorax.  IMPRESSION: Cardiac enlargement with pulmonary vascular congestion and interstitial edema.  Original Report Authenticated By: Marlon Pel, M.D.  4:49 AM Pt with episodes of cough/wheeze that have responded to nebs Will continue to treat with nebs given recurrence of wheeze 5:39 AM Pt improved but still with coarse wheeze noted bilaterally She is able to speak to me clearly Given that she had two EMS visits while at home and was in distress on initial EMS arrival, will place in observation for continued monitoring/nebs.  She is improved and speaking comfortably but feel she would benefit from observation Also, I doubt this is acute/decompensate CHF at this time after further discussion with patient She reports h/o these symptoms previously She reports chest wall pain mostly from cough I doubt ACS/PE at this time       MDM  Nursing notes reviewed and considered in documentation All labs/vitals reviewed and considered xrays reviewed and considered Previous records reviewed and considered        Date: 04/20/2012  Rate: 70  Rhythm: normal sinus rhythm  QRS Axis: normal  Intervals: normal  ST/T Wave abnormalities: nonspecific ST changes  Conduction Disutrbances:none  Narrative Interpretation:   Old EKG Reviewed: unchanged    Joya Gaskins, MD 04/20/12 [phone]

## 2012-04-20 NOTE — ED Notes (Signed)
Heart Healthy Diet Ordered  

## 2012-04-20 NOTE — ED Notes (Signed)
Assumed care, received pt from major side via w/c after finishing breakfast

## 2012-04-21 DIAGNOSIS — I10 Essential (primary) hypertension: Secondary | ICD-10-CM

## 2012-04-21 DIAGNOSIS — I509 Heart failure, unspecified: Secondary | ICD-10-CM

## 2012-04-21 DIAGNOSIS — J438 Other emphysema: Secondary | ICD-10-CM

## 2012-04-21 LAB — COMPREHENSIVE METABOLIC PANEL
ALT: 21 U/L (ref 0–35)
AST: 19 U/L (ref 0–37)
Alkaline Phosphatase: 97 U/L (ref 39–117)
CO2: 32 mEq/L (ref 19–32)
Chloride: 99 mEq/L (ref 96–112)
Creatinine, Ser: 0.76 mg/dL (ref 0.50–1.10)
GFR calc non Af Amer: >90 mL/min (ref >90)
Sodium: 139 mEq/L (ref 135–145)
Total Bilirubin: 0.4 mg/dL (ref 0.3–1.2)

## 2012-04-21 LAB — CBC
MCH: 31 pg (ref 26.0–34.0)
MCHC: 32.5 g/dL (ref 30.0–36.0)
Platelets: 251 10*3/uL (ref 150–400)
RDW: 15 % (ref 11.5–15.5)

## 2012-04-21 LAB — HEMOGLOBIN A1C: Hgb A1c MFr Bld: 6.8 % — ABNORMAL HIGH (ref <5.7)

## 2012-04-21 MED ORDER — METHYLPREDNISOLONE SODIUM SUCC 125 MG IJ SOLR
80.0000 mg | Freq: Three times a day (TID) | INTRAMUSCULAR | Status: AC
Start: 2012-04-21 — End: 2012-04-21
  Administered 2012-04-21 (×2): 80 mg via INTRAVENOUS
  Filled 2012-04-21 (×2): qty 2

## 2012-04-21 MED ORDER — SODIUM CHLORIDE 0.9 % IV SOLN
INTRAVENOUS | Status: DC
  Administered 2012-04-21 (×2): via INTRAVENOUS

## 2012-04-21 MED ORDER — PREDNISONE 50 MG PO TABS
50.0000 mg | ORAL_TABLET | Freq: Every day | ORAL | Status: DC
  Administered 2012-04-22: 50 mg via ORAL
  Filled 2012-04-21 (×2): qty 1

## 2012-04-21 MED ORDER — ATENOLOL 25 MG PO TABS
25.0000 mg | ORAL_TABLET | Freq: Every day | ORAL | Status: DC
  Administered 2012-04-21 – 2012-04-22 (×2): 25 mg via ORAL
  Filled 2012-04-21 (×2): qty 1

## 2012-04-21 NOTE — Clinical Social Work Psychosocial (Signed)
     Clinical Social Work Department BRIEF PSYCHOSOCIAL ASSESSMENT 04/21/2012  Patient:  Margaret [name], Margaret [name]     Account Number:  [id number]     Admit date:  04/20/2012  Clinical Social Worker:  Jacelyn Grip  Date/Time:  04/21/2012 01:50 PM  Referred by:  RN  Date Referred:  04/21/2012 Referred for  Advanced Directives   Other Referral:   Interview type:  Patient Other interview type:    PSYCHOSOCIAL DATA Living Status:  HUSBAND Admitted from facility:   Level of care:   Primary support name:  Chrissie Noa Umphlett/[phone] Primary support relationship to patient:  SPOUSE Degree of support available:   unknown    CURRENT CONCERNS Current Concerns  Post-Acute Placement   Other Concerns:    SOCIAL WORK ASSESSMENT / PLAN CSW met with pt to discuss Advanced Directives. CSW provided packet and discussed process of completing advanced directives packet. CSW clarified pt questions and encouraged pt to discuss with family. CSW educated pt on how to obtain notary either while here in the hospital or in the community after discharge. CSW to continue to follow to provide assistance with arranging notary if needed.   Assessment/plan status:  Psychosocial Support/Ongoing Assessment of Needs Other assessment/ plan:   Information/referral to community resources:   Biochemist, clinical    PATIENTS/FAMILYS RESPONSE TO PLAN OF CARE: Pt alert and oriented x 4. Pt appreciative of CSW visit and information about Advanced Directives. Pt stated that she would notify this CSW with any questions or for the need of a notary.

## 2012-04-21 NOTE — Progress Notes (Signed)
[facility] PROGRESS NOTE  Margaret [name] ZOX:[id number] DOB: 02/24/1956 DOA: 04/20/2012   Assessment/Plan: Patient Active Hospital Problem List: COPD (chronic obstructive pulmonary disease) (04/20/2012) -change steroids to IV, continue inhalers. I agree with her azithromycin. Change medications to by mouth. -PFTs as an outpatient.  HTN (hypertension) (04/20/2012) -blood pressure control, hold atenolol as she is bradycardic. Resume atenolol at lower dose.  DM (diabetes mellitus) (04/20/2012) - good control on steroids. Continue to monitor -Check a hemoglobin A1c, at home she is on no diabetes medication  Code Status: Full code Family Communication: Spouse [phone] Disposition Plan: To be determined  Lambert Keto, MD  [facility] Pager [phone]  If 7PM-7AM, please contact night-coverage [email or web address] Password [email or web address] 04/21/2012, 10:32 AM   LOS: 1 day   Procedures:  None  Antibiotics:  Azithromycin may 30th 2012  Interim History:   Subjective: She relates her shortness of breath is better. She relates she is able to ambulate now without any difficulties.  Objective: Filed Vitals:   04/20/12 2116 04/21/12 0308 04/21/12 0550 04/21/12 0820  BP: 128/76  143/90   Pulse: 61  52   Temp: 98.5 F (36.9 C)  97.5 F (36.4 C)   TempSrc: Oral  Oral   Resp: 22  23   Height:      Weight:      SpO2: 95% 96% 96% 95%    Intake/Output Summary (Last 24 hours) at 04/21/12 1032 Last data filed at 04/20/12 1935  Gross per 24 hour  Intake    120 ml  Output    600 ml  Net   -480 ml   Weight change:   Exam:  General: Alert, awake, oriented x3, in no acute distress.  HEENT: No bruits, no goiter.  Heart: Regular rate and rhythm, without murmurs, rubs, gallops.  Lungs: Good air movement and expansion, has bilateral wheezing.  Abdomen: Soft, nontender, nondistended, positive bowel sounds.  Neuro: Grossly intact, nonfocal.   Data  Reviewed: Basic Metabolic Panel:  Lab 04/21/12 6578 04/20/12 0344  NA 139 136  K 4.5 3.9  CL 99 101  CO2 32 26  GLUCOSE 128* 127*  BUN 14 12  CREATININE 0.76 0.81  CALCIUM 9.8 9.3  MG -- --  PHOS -- --   Liver Function Tests:  Lab 04/21/12 0545  AST 19  ALT 21  ALKPHOS 97  BILITOT 0.4  PROT 7.1  ALBUMIN 3.4*   No results found for this basename: LIPASE:5,AMYLASE:5 in the last 168 hours No results found for this basename: AMMONIA:5 in the last 168 hours CBC:  Lab 04/21/12 0545 04/20/12 0344  WBC 9.9 9.5  NEUTROABS -- 8.0*  HGB 16.4* 16.7*  HCT 50.4* 49.6*  MCV 95.3 94.1  PLT 251 219   Cardiac Enzymes: No results found for this basename: CKTOTAL:5,CKMB:5,CKMBINDEX:5,TROPONINI:5 in the last 168 hours BNP: No components found with this basename: POCBNP:5 CBG: No results found for this basename: GLUCAP:5 in the last 168 hours  No results found for this or any previous visit (from the past 240 hour(s)).   Studies: Dg Chest Port 1 View  04/20/2012  *RADIOLOGY REPORT*  Clinical Data: Shortness of breath, cough and chest tightness.  PORTABLE CHEST - 1 VIEW  Comparison: 06/24/2010  Findings: Cardiac enlargement with pulmonary vascular congestion and interstitial changes suggesting interstitial edema.  No blunting of costophrenic angles.  No focal consolidation.  No pneumothorax.  IMPRESSION: Cardiac enlargement with pulmonary vascular congestion and interstitial edema.  Original Report Authenticated By: Chrissie Noa  R. STEVENS, M.D.    Scheduled Meds:   . albuterol  10 mg Nebulization Once  . albuterol  2.5 mg Nebulization Q6H  . aspirin  81 mg Oral Daily  . atenolol  50 mg Oral Daily  . azithromycin  500 mg Oral Daily  . furosemide  40 mg Intravenous Once  . furosemide  20 mg Oral QPC supper  . furosemide  40 mg Oral QAC breakfast  . methylPREDNISolone (SOLU-MEDROL) injection  80 mg Intravenous Q8H  . omega-3 acid ethyl esters  1 g Oral Daily  . predniSONE  50 mg Oral  Q breakfast  . tiotropium  18 mcg Inhalation Daily  . DISCONTD: fish oil-omega-3 fatty acids  1 g Oral Daily  . DISCONTD: furosemide  20-40 mg Oral BID  . DISCONTD: predniSONE  60 mg Oral QAC breakfast   Continuous Infusions:   . sodium chloride 75 mL/hr at 04/21/12 0745

## 2012-04-21 NOTE — Progress Notes (Signed)
Clinical Child psychotherapist received referral for [facility]. Clinical Social Worker attempted to assess pt for Advanced Directives x 2, but pt on personal phone call during both attempts. Clinical Social Worker to follow up to assess pt for Advanced Directives.   Jacklynn Lewis, MSW, LCSWA  Clinical Social Work [phone]

## 2012-04-22 DIAGNOSIS — I509 Heart failure, unspecified: Secondary | ICD-10-CM

## 2012-04-22 DIAGNOSIS — J438 Other emphysema: Secondary | ICD-10-CM

## 2012-04-22 DIAGNOSIS — I10 Essential (primary) hypertension: Secondary | ICD-10-CM

## 2012-04-22 MED ORDER — PNEUMOCOCCAL VAC POLYVALENT 25 MCG/0.5ML IJ INJ
0.5000 mL | INJECTION | Freq: Once | INTRAMUSCULAR | Status: AC
  Administered 2012-04-22: 0.5 mL via INTRAMUSCULAR
  Filled 2012-04-22: qty 0.5

## 2012-04-22 MED ORDER — AZITHROMYCIN 500 MG PO TABS: 500.0000 mg | ORAL_TABLET | Freq: Every day | ORAL | Status: AC

## 2012-04-22 MED ORDER — TIOTROPIUM BROMIDE MONOHYDRATE 18 MCG IN CAPS: 18.0000 ug | ORAL_CAPSULE | Freq: Every day | RESPIRATORY_TRACT | Status: DC

## 2012-04-22 MED ORDER — IPRATROPIUM-ALBUTEROL 18-103 MCG/ACT IN AERO: 2.0000 | INHALATION_SPRAY | Freq: Four times a day (QID) | RESPIRATORY_TRACT | Status: DC

## 2012-04-22 MED ORDER — ALBUTEROL SULFATE HFA 108 (90 BASE) MCG/ACT IN AERS: 2.0000 | INHALATION_SPRAY | Freq: Four times a day (QID) | RESPIRATORY_TRACT | Status: DC | PRN

## 2012-04-22 MED ORDER — PREDNISONE 10 MG PO TABS: ORAL_TABLET | ORAL | Status: DC

## 2012-04-22 NOTE — Progress Notes (Signed)
Patient Sao2 at room air or at rest is 88%,while on ambulation 85%,Md notified,home O2 recommended,Supply given to pt. upon discharge.

## 2012-04-22 NOTE — Care Management Note (Signed)
    Page 1 of 1   04/22/2012     3:26:42 PM   CARE MANAGEMENT NOTE 04/22/2012  Patient:  CHASTIN, GARLITZ   Account Number:  [id number]  Date Initiated:  04/22/2012  Documentation initiated by:  Letha Cape  Subjective/Objective Assessment:   dx copd  admit- live with spouse.  pta independent.     Action/Plan:   Anticipated DC Date:  04/22/2012   Anticipated DC Plan:  HOME/SELF CARE      DC Planning Services  CM consult      Choice offered to / List presented to:     DME arranged  [facility]      DME agency  [facility]        Status of service:  Completed, signed off Medicare Important Message given?   (If response is "NO", the following Medicare IM given date fields will be blank) Date Medicare IM given:   Date Additional Medicare IM given:    Discharge Disposition:  HOME/SELF CARE  Per UR Regulation:  Reviewed for med. necessity/level of care/duration of stay  If discussed at Long Length of Stay Meetings, dates discussed:    Comments:  04/22/12 15:24 Letha Cape RN, BSN [phone] patient lives with spouse, pta independent.  Patient will need home oxygen for 2 weeks.  Referral  made to [facility] , [place] notified.  Patient is eligible for medication ast if needed.  Atrovent inh is $52 at [facility] patient states she can afford that, patient still works.  Patient has transportation home as well.

## 2012-04-22 NOTE — Discharge Summary (Addendum)
Physician Discharge Summary  Margaret [name] NWG:[id number] DOB: Oct 18, 1956 DOA: 04/20/2012  PCP: Quentin Mulling, MD, MD  Admit date: 04/20/2012 Discharge date: 04/22/2012  Discharge Diagnoses:  Principal Problem:  *COPD (chronic obstructive pulmonary disease) Active Problems:  HTN (hypertension)  DM (diabetes mellitus)   Discharge Condition: Stable  Disposition:  Follow-up Information    Follow up with HOOPER,JEFFREY C, MD in 2 weeks. [facility] followup)    Contact information:   [address] [place] [address] [phone]          History of present illness:  56 yr old female was well till /29 afternoon and had window open. She thinks the pollen caused there to start having cough/sneeze. Took an allergy medicine and became sob with laboured breathing and felt exhausted and went to Dr. Charlane Ferretti office for evaluation and as he was backed up, she went HOme without being seen and then awoke 21:00 last pmand couldn;t breath. It was laboured, she was gasping for air and this giot worse and worse and her husband called EMS and she was given O2 and had a panic attack apparently from this and she calmed a little and stopped hyperventilating and felt better.  EMS left and about 3 hours later she had increasing SOB once again and EMS came out-She was given breathing Rx on way to Hospital and also given Oxygen and another breathing Rx and had 2 more here and still SOB with some desats so referred for admit.   Hospital Course:   *COPD (chronic obstructive pulmonary disease): She was admitted to the hospital start her on IV Solu-Medrol, antibiotics and inhalers. By the next day she was much improved. She ambulated with out oxygen and she desaturated. So she will go home on 2 L of oxygen for the next 2 weeks and be reevaluated by her primary care Dr. Her hemoglobin is 16. This and she has what seems to be chronic obstructive pulmonary disease. We'll have to be reevaluated  for permanent oxygen use as an outpatient.  Active Problems:  HTN (hypertension): Stable no changes were made  DM (diabetes mellitus)  Discharge Exam: Filed Vitals:   04/22/12 0847  BP: 155/74  Pulse: 62  Temp:   Resp:    Filed Vitals:   04/22/12 0816 04/22/12 0847 04/22/12 1030 04/22/12 1110  BP:  155/74    Pulse:  62    Temp:      TempSrc:      Resp:      Height:      Weight:      SpO2: 96%  88% 85%   General: Alert, awake, oriented x3, in no acute distress.  HEENT: No bruits, no goiter.  Heart: Regular rate and rhythm, without murmurs, rubs, gallops.  Lungs: Good air movement and expansion, has bilateral wheezing.  Abdomen: Soft, nontender, nondistended, positive bowel sounds.  Neuro: Grossly intact, nonfocal.  Discharge Instructions  Discharge Orders    Future Orders Please Complete By Expires   Diet - low sodium heart healthy      Increase activity slowly        Medication List  As of 04/22/2012 12:53 PM   STOP taking these medications         albuterol 108 (90 BASE) MCG/ACT inhaler         TAKE these medications         albuterol-ipratropium 18-103 MCG/ACT inhaler   Commonly known as: COMBIVENT   Inhale 2 puffs into the lungs 4 (  four) times daily.      aspirin 81 MG chewable tablet   Chew 81 mg by mouth daily.      atenolol 50 MG tablet   Commonly known as: TENORMIN   Take 50 mg by mouth daily.      azithromycin 500 MG tablet   Commonly known as: ZITHROMAX   Take 1 tablet (500 mg total) by mouth daily.      cloNIDine 0.1 MG tablet   Commonly known as: CATAPRES   Take 0.1 mg by mouth daily.      fish oil-omega-3 fatty acids 1000 MG capsule   Take 1 g by mouth daily.      furosemide 40 MG tablet   Commonly known as: LASIX   Take 20-40 mg by mouth 2 (two) times daily. Take one tablet in the morning and one-half a tablet in the evening      predniSONE 10 MG tablet   Commonly known as: DELTASONE   Takes 6 tablets for 1 days, then 5 tablets  for 1 days, then 4 tablets for 1 days, then 3 tablets for 1 days, then 2 tabs for 1 days, then 1 tab for 1 days, and then stop.              The results of significant diagnostics from this hospitalization (including imaging, microbiology, ancillary and laboratory) are listed below for reference.    Significant Diagnostic Studies: Dg Chest Port 1 View  04/20/2012  *RADIOLOGY REPORT*  Clinical Data: Shortness of breath, cough and chest tightness.  PORTABLE CHEST - 1 VIEW  Comparison: 06/24/2010  Findings: Cardiac enlargement with pulmonary vascular congestion and interstitial changes suggesting interstitial edema.  No blunting of costophrenic angles.  No focal consolidation.  No pneumothorax.  IMPRESSION: Cardiac enlargement with pulmonary vascular congestion and interstitial edema.  Original Report Authenticated By: Marlon Pel, M.D.    Microbiology: No results found for this or any previous visit (from the past 240 hour(s)).   Labs: Basic Metabolic Panel:  Lab 04/21/12 1610 04/20/12 0344  NA 139 136  K 4.5 3.9  CL 99 101  CO2 32 26  GLUCOSE 128* 127*  BUN 14 12  CREATININE 0.76 0.81  CALCIUM 9.8 9.3  MG -- --  PHOS -- --   Liver Function Tests:  Lab 04/21/12 0545  AST 19  ALT 21  ALKPHOS 97  BILITOT 0.4  PROT 7.1  ALBUMIN 3.4*   No results found for this basename: LIPASE:5,AMYLASE:5 in the last 168 hours No results found for this basename: AMMONIA:5 in the last 168 hours CBC:  Lab 04/21/12 0545 04/20/12 0344  WBC 9.9 9.5  NEUTROABS -- 8.0*  HGB 16.4* 16.7*  HCT 50.4* 49.6*  MCV 95.3 94.1  PLT 251 219   Cardiac Enzymes: No results found for this basename: CKTOTAL:5,CKMB:5,CKMBINDEX:5,TROPONINI:5 in the last 168 hours BNP: No components found with this basename: POCBNP:5 CBG: No results found for this basename: GLUCAP:5 in the last 168 hours  Time coordinating discharge: Greater than 30 minutes  Signed:  Marinda Elk  [facility] 04/22/2012, 12:53 PM

## 2012-04-22 NOTE — Discharge Instructions (Signed)
Margaret [name] was admitted to the Hospital on 04/20/2012 and Discharged on Discharge Date 04/22/2012 and should be excused from work/school   for 4 day  days starting 04/20/2012 , may return to work/school without any restrictions.  Call Margaret Keto MD, [facility] [phone] with questions.  Margaret [name] M.D on 04/22/2012,at 10:14 AM  [facility] Office  [phone]

## 2012-04-22 NOTE — Progress Notes (Signed)
Clinical Social Worker met with pt at bedside to follow up in regard to Advanced Directives packet. Pt stated that she did not have any questions at this time and did not need notary at this time. Pt discussed that she is aware of where to follow up in the community in order to get document notarized if she chooses to do so. Per MD, pt medically stable for discharge today. No further social work needs identified at this time. Clinical Social Worker signing off.  Jacklynn Lewis, MSW, LCSWA  Clinical Social Work [phone]

## 2012-04-22 NOTE — Progress Notes (Signed)
Pt. discharge to floor,verbalized understanding of discharged instruction,medication,restriction,diet and follow up appointment.Baseline Vitals sign stable,Pt comfortable,no sign and symptom of distress. 

## 2012-04-23 DIAGNOSIS — J189 Pneumonia, unspecified organism: Secondary | ICD-10-CM

## 2012-04-23 HISTORY — DX: Pneumonia, unspecified organism: J18.9

## 2012-04-27 ENCOUNTER — Inpatient Hospital Stay (HOSPITAL_COMMUNITY)
Admission: EM | Admit: 2012-04-27 | Discharge: 2012-04-30 | DRG: 193 | Disposition: A | Discharge status: Home / Self Care | Payer: MEDICAID | Source: Ambulatory Visit | Attending: Internal Medicine | Admitting: Internal Medicine

## 2012-04-27 ENCOUNTER — Emergency Department (HOSPITAL_COMMUNITY): Payer: Self-pay

## 2012-04-27 ENCOUNTER — Encounter (HOSPITAL_COMMUNITY): Payer: Self-pay | Admitting: Physical Medicine and Rehabilitation

## 2012-04-27 DIAGNOSIS — I10 Essential (primary) hypertension: Secondary | ICD-10-CM

## 2012-04-27 DIAGNOSIS — J441 Chronic obstructive pulmonary disease with (acute) exacerbation: Secondary | ICD-10-CM | POA: Diagnosis present

## 2012-04-27 DIAGNOSIS — I739 Peripheral vascular disease, unspecified: Secondary | ICD-10-CM | POA: Diagnosis present

## 2012-04-27 DIAGNOSIS — Z79899 Other long term (current) drug therapy: Secondary | ICD-10-CM

## 2012-04-27 DIAGNOSIS — F17200 Nicotine dependence, unspecified, uncomplicated: Secondary | ICD-10-CM

## 2012-04-27 DIAGNOSIS — I5032 Chronic diastolic (congestive) heart failure: Secondary | ICD-10-CM

## 2012-04-27 DIAGNOSIS — Z6841 Body Mass Index (BMI) 40.0 and over, adult: Secondary | ICD-10-CM

## 2012-04-27 DIAGNOSIS — I509 Heart failure, unspecified: Secondary | ICD-10-CM

## 2012-04-27 DIAGNOSIS — J189 Pneumonia, unspecified organism: Secondary | ICD-10-CM

## 2012-04-27 DIAGNOSIS — E669 Obesity, unspecified: Secondary | ICD-10-CM

## 2012-04-27 DIAGNOSIS — R0609 Other forms of dyspnea: Secondary | ICD-10-CM | POA: Diagnosis present

## 2012-04-27 DIAGNOSIS — G4733 Obstructive sleep apnea (adult) (pediatric): Secondary | ICD-10-CM | POA: Diagnosis present

## 2012-04-27 DIAGNOSIS — E119 Type 2 diabetes mellitus without complications: Secondary | ICD-10-CM | POA: Diagnosis present

## 2012-04-27 DIAGNOSIS — E871 Hypo-osmolality and hyponatremia: Secondary | ICD-10-CM | POA: Diagnosis present

## 2012-04-27 DIAGNOSIS — R042 Hemoptysis: Secondary | ICD-10-CM | POA: Diagnosis present

## 2012-04-27 DIAGNOSIS — Z7982 Long term (current) use of aspirin: Secondary | ICD-10-CM

## 2012-04-27 DIAGNOSIS — J449 Chronic obstructive pulmonary disease, unspecified: Secondary | ICD-10-CM | POA: Diagnosis present

## 2012-04-27 DIAGNOSIS — E785 Hyperlipidemia, unspecified: Secondary | ICD-10-CM | POA: Diagnosis present

## 2012-04-27 DIAGNOSIS — J9600 Acute respiratory failure, unspecified whether with hypoxia or hypercapnia: Secondary | ICD-10-CM | POA: Diagnosis present

## 2012-04-27 DIAGNOSIS — R0902 Hypoxemia: Secondary | ICD-10-CM | POA: Diagnosis present

## 2012-04-27 HISTORY — DX: Pneumonia, unspecified organism: J18.9

## 2012-04-27 HISTORY — DX: Chronic diastolic (congestive) heart failure: I50.32

## 2012-04-27 LAB — URINALYSIS, ROUTINE W REFLEX MICROSCOPIC
Glucose, UA: NEGATIVE mg/dL
Protein, ur: NEGATIVE mg/dL
Specific Gravity, Urine: 1.012 (ref 1.005–1.030)
pH: 5.5 (ref 5.0–8.0)

## 2012-04-27 LAB — URINE MICROSCOPIC-ADD ON

## 2012-04-27 LAB — DIFFERENTIAL
Basophils Absolute: 0 10*3/uL (ref 0.0–0.1)
Basophils Relative: 0 % (ref 0–1)
Eosinophils Absolute: 0 10*3/uL (ref 0.0–0.7)
Eosinophils Absolute: 0 10*3/uL (ref 0.0–0.7)
Eosinophils Relative: 0 % (ref 0–5)
Eosinophils Relative: 0 % (ref 0–5)
Lymphocytes Relative: 6 % — ABNORMAL LOW (ref 12–46)
Lymphocytes Relative: 6 % — ABNORMAL LOW (ref 12–46)
Lymphs Abs: 1.1 10*3/uL (ref 0.7–4.0)
Lymphs Abs: 1.3 10*3/uL (ref 0.7–4.0)
Monocytes Absolute: 1.9 10*3/uL — ABNORMAL HIGH (ref 0.1–1.0)
Monocytes Absolute: 2 10*3/uL — ABNORMAL HIGH (ref 0.1–1.0)
Monocytes Relative: 9 % (ref 3–12)
Neutro Abs: 16.8 10*3/uL — ABNORMAL HIGH (ref 1.7–7.7)
Neutrophils Relative %: 85 % — ABNORMAL HIGH (ref 43–77)

## 2012-04-27 LAB — BASIC METABOLIC PANEL
BUN: 9 mg/dL (ref 6–23)
CO2: 28 mEq/L (ref 19–32)
Calcium: 9.7 mg/dL (ref 8.4–10.5)
Chloride: 98 mEq/L (ref 96–112)
Creatinine, Ser: 0.71 mg/dL (ref 0.50–1.10)
GFR calc Af Amer: >90 mL/min (ref >90)
GFR calc non Af Amer: >90 mL/min (ref >90)
Glucose, Bld: 148 mg/dL — ABNORMAL HIGH (ref 70–99)
Potassium: 4.1 mEq/L (ref 3.5–5.1)
Sodium: 134 mEq/L — ABNORMAL LOW (ref 135–145)

## 2012-04-27 LAB — GLUCOSE, CAPILLARY: Glucose-Capillary: 171 mg/dL — ABNORMAL HIGH (ref 70–99)

## 2012-04-27 LAB — CBC
HCT: 49.5 % — ABNORMAL HIGH (ref 36.0–46.0)
HCT: 47.3 % — ABNORMAL HIGH (ref 36.0–46.0)
Hemoglobin: 16.5 g/dL — ABNORMAL HIGH (ref 12.0–15.0)
MCH: 31 pg (ref 26.0–34.0)
MCHC: 33.3 g/dL (ref 30.0–36.0)
MCV: 92.9 fL (ref 78.0–100.0)
Platelets: 292 10*3/uL (ref 150–400)
Platelets: 257 10*3/uL (ref 150–400)
RBC: 5.33 MIL/uL — ABNORMAL HIGH (ref 3.87–5.11)
RBC: 5.04 MIL/uL (ref 3.87–5.11)
RDW: 14.6 % (ref 11.5–15.5)
RDW: 14.7 % (ref 11.5–15.5)
WBC: 19.8 10*3/uL — ABNORMAL HIGH (ref 4.0–10.5)
WBC: 20.7 10*3/uL — ABNORMAL HIGH (ref 4.0–10.5)

## 2012-04-27 LAB — CREATININE, SERUM
GFR calc Af Amer: >90 mL/min (ref >90)
GFR calc non Af Amer: >90 mL/min (ref >90)

## 2012-04-27 LAB — PRO B NATRIURETIC PEPTIDE: Pro B Natriuretic peptide (BNP): 896.1 pg/mL — ABNORMAL HIGH (ref 0–125)

## 2012-04-27 MED ORDER — FUROSEMIDE 10 MG/ML IJ SOLN
40.0000 mg | Freq: Two times a day (BID) | INTRAMUSCULAR | Status: DC
  Administered 2012-04-27 – 2012-04-28 (×2): 40 mg via INTRAVENOUS
  Filled 2012-04-27 (×4): qty 4

## 2012-04-27 MED ORDER — INSULIN ASPART 100 UNIT/ML ~~LOC~~ SOLN
0.0000 [IU] | Freq: Every day | SUBCUTANEOUS | Status: DC
  Administered 2012-04-27: 2 [IU] via SUBCUTANEOUS

## 2012-04-27 MED ORDER — BIOTENE DRY MOUTH MT LIQD
15.0000 mL | Freq: Two times a day (BID) | OROMUCOSAL | Status: DC
  Administered 2012-04-27 – 2012-04-30 (×6): 15 mL via OROMUCOSAL

## 2012-04-27 MED ORDER — INSULIN ASPART 100 UNIT/ML ~~LOC~~ SOLN
0.0000 [IU] | Freq: Three times a day (TID) | SUBCUTANEOUS | Status: DC
  Administered 2012-04-27 – 2012-04-28 (×3): 4 [IU] via SUBCUTANEOUS
  Administered 2012-04-28: 7 [IU] via SUBCUTANEOUS
  Administered 2012-04-29 (×2): 4 [IU] via SUBCUTANEOUS
  Administered 2012-04-29: 7 [IU] via SUBCUTANEOUS
  Administered 2012-04-30: 4 [IU] via SUBCUTANEOUS
  Administered 2012-04-30: 3 [IU] via SUBCUTANEOUS

## 2012-04-27 MED ORDER — METHYLPREDNISOLONE SODIUM SUCC 40 MG IJ SOLR
40.0000 mg | Freq: Four times a day (QID) | INTRAMUSCULAR | Status: DC
  Administered 2012-04-27 – 2012-04-29 (×7): 40 mg via INTRAVENOUS
  Filled 2012-04-27 (×11): qty 1

## 2012-04-27 MED ORDER — LEVOFLOXACIN IN D5W 750 MG/150ML IV SOLN
750.0000 mg | INTRAVENOUS | Status: AC
  Administered 2012-04-28 – 2012-04-29 (×2): 750 mg via INTRAVENOUS
  Filled 2012-04-27 (×3): qty 150

## 2012-04-27 MED ORDER — ALBUTEROL SULFATE (5 MG/ML) 0.5% IN NEBU
2.5000 mg | INHALATION_SOLUTION | RESPIRATORY_TRACT | Status: DC
  Administered 2012-04-27 – 2012-04-29 (×13): 2.5 mg via RESPIRATORY_TRACT
  Filled 2012-04-27 (×13): qty 0.5

## 2012-04-27 MED ORDER — ALBUTEROL SULFATE HFA 108 (90 BASE) MCG/ACT IN AERS
2.0000 | INHALATION_SPRAY | Freq: Four times a day (QID) | RESPIRATORY_TRACT | Status: DC | PRN
  Filled 2012-04-27: qty 6.7

## 2012-04-27 MED ORDER — ASPIRIN 81 MG PO CHEW
81.0000 mg | CHEWABLE_TABLET | Freq: Every day | ORAL | Status: DC
  Administered 2012-04-28 – 2012-04-30 (×3): 81 mg via ORAL
  Filled 2012-04-27 (×3): qty 1

## 2012-04-27 MED ORDER — IPRATROPIUM BROMIDE 0.02 % IN SOLN
0.5000 mg | Freq: Once | RESPIRATORY_TRACT | Status: AC
  Administered 2012-04-27: 0.5 mg via RESPIRATORY_TRACT
  Filled 2012-04-27: qty 2.5

## 2012-04-27 MED ORDER — DEXTROSE 5 % IV SOLN
1.0000 g | Freq: Three times a day (TID) | INTRAVENOUS | Status: DC
  Administered 2012-04-27 – 2012-04-29 (×5): 1 g via INTRAVENOUS
  Filled 2012-04-27 (×11): qty 1

## 2012-04-27 MED ORDER — SODIUM CHLORIDE 0.9 % IV SOLN: 250.0000 mL | INTRAVENOUS | Status: DC | PRN

## 2012-04-27 MED ORDER — VANCOMYCIN HCL 1000 MG IV SOLR
2500.0000 mg | Freq: Once | INTRAVENOUS | Status: DC
  Filled 2012-04-27: qty 2500

## 2012-04-27 MED ORDER — ATENOLOL 50 MG PO TABS
50.0000 mg | ORAL_TABLET | Freq: Every day | ORAL | Status: DC
  Administered 2012-04-28 – 2012-04-30 (×3): 50 mg via ORAL
  Filled 2012-04-27 (×3): qty 1

## 2012-04-27 MED ORDER — CLONIDINE HCL 0.1 MG PO TABS
0.1000 mg | ORAL_TABLET | Freq: Every day | ORAL | Status: DC
  Administered 2012-04-28 – 2012-04-30 (×3): 0.1 mg via ORAL
  Filled 2012-04-27 (×3): qty 1

## 2012-04-27 MED ORDER — DEXTROSE 5 % IV SOLN
1.0000 g | Freq: Once | INTRAVENOUS | Status: DC
  Filled 2012-04-27: qty 1

## 2012-04-27 MED ORDER — IPRATROPIUM BROMIDE 0.02 % IN SOLN
0.5000 mg | RESPIRATORY_TRACT | Status: DC
  Administered 2012-04-27 – 2012-04-29 (×13): 0.5 mg via RESPIRATORY_TRACT
  Filled 2012-04-27 (×13): qty 2.5

## 2012-04-27 MED ORDER — VANCOMYCIN HCL 1000 MG IV SOLR
1250.0000 mg | Freq: Two times a day (BID) | INTRAVENOUS | Status: DC
  Administered 2012-04-27 – 2012-04-28 (×4): 1250 mg via INTRAVENOUS
  Filled 2012-04-27 (×5): qty 1250

## 2012-04-27 MED ORDER — SODIUM CHLORIDE 0.9 % IJ SOLN: 3.0000 mL | INTRAMUSCULAR | Status: DC | PRN

## 2012-04-27 MED ORDER — HEPARIN SODIUM (PORCINE) 5000 UNIT/ML IJ SOLN
5000.0000 [IU] | Freq: Three times a day (TID) | INTRAMUSCULAR | Status: DC
  Administered 2012-04-27 – 2012-04-30 (×9): 5000 [IU] via SUBCUTANEOUS
  Filled 2012-04-27 (×11): qty 1

## 2012-04-27 MED ORDER — SODIUM CHLORIDE 0.9 % IJ SOLN
3.0000 mL | Freq: Two times a day (BID) | INTRAMUSCULAR | Status: DC
  Administered 2012-04-28: 3 mL via INTRAVENOUS

## 2012-04-27 MED ORDER — POTASSIUM CHLORIDE CRYS ER 20 MEQ PO TBCR
30.0000 meq | EXTENDED_RELEASE_TABLET | Freq: Two times a day (BID) | ORAL | Status: DC
  Administered 2012-04-27 – 2012-04-30 (×6): 30 meq via ORAL
  Filled 2012-04-27 (×7): qty 1

## 2012-04-27 MED ORDER — CLONIDINE HCL 0.1 MG PO TABS: 0.1000 mg | ORAL_TABLET | Freq: Two times a day (BID) | ORAL | Status: DC

## 2012-04-27 MED ORDER — LEVOFLOXACIN IN D5W 750 MG/150ML IV SOLN
750.0000 mg | Freq: Once | INTRAVENOUS | Status: AC
  Administered 2012-04-27: 750 mg via INTRAVENOUS
  Filled 2012-04-27: qty 150

## 2012-04-27 MED ORDER — ALBUTEROL SULFATE (5 MG/ML) 0.5% IN NEBU
5.0000 mg | INHALATION_SOLUTION | Freq: Once | RESPIRATORY_TRACT | Status: AC
  Administered 2012-04-27: 5 mg via RESPIRATORY_TRACT
  Filled 2012-04-27: qty 1

## 2012-04-27 MED ORDER — VANCOMYCIN HCL 1000 MG IV SOLR
1250.0000 mg | Freq: Once | INTRAVENOUS | Status: AC
  Administered 2012-04-27: 1250 mg via INTRAVENOUS
  Filled 2012-04-27: qty 1250

## 2012-04-27 NOTE — H&P (Signed)
History and Physical Examination  Date: 04/27/2012  Patient name: Margaret [name] Medical record number: [id number] Date of birth: 04-Mar-1956 Age: 56 y.o. Gender: female PCP: Quentin Mulling, MD, MD  Chief Complaint:  Chief Complaint  Patient presents with  . Shortness of Breath  . Hematemesis     History of Present Illness: Margaret [name] is an 56 y.o. female morbidly obese smoker with COPD who was recently discharged from the hospital with an acute exacerbation of COPD on home oxygen presented to the emergency department today reporting progressive shortness of breath cough and wheezing.  The patient was concerned because she started coughing up small amounts of blood.  The patient reports that she completed azithromycin after being discharge.  She reports that she began to develop progressive shortness of breath and cough.  She reports that only small distances exacerbated the shortness of breath.  The patient denies chills but reports low-grade fever.  Patient denies chest pain.  The patient has no history of coughing up blood in the past.  She does have acid reflux, continues to smoke cigarettes.  She was seen in the emergency department and a chest x-ray was positive for left lower lobe pneumonia.  She was mildly hypoxic and had significant cough and wheezing.  She was given a nebulizer treatment which significantly helped to improve her symptoms.  She was started on a protocol for hospital associated pneumonia.  Hospital admission was requested for further evaluation and management.  Past Medical History Past Medical History  Diagnosis Date  . Hypertension   . Multiple allergies   . Hyperlipidemia   . Peripheral vascular disease   . Heart murmur   . Angina   . COPD (chronic obstructive pulmonary disease) 04/20/12    "just dx'd with this today"  . Asthma   . Bronchitis 04/20/12    "just got over 3rd bout this winter ~ 6-8 wk ago"  . Shortness of breath 04/20/12    "all the  time"  . Type II diabetes mellitus     "borderline"  . Anemia   . H/O hiatal hernia   . Migraines   . Arthritis     "my knees"  . Panic attacks     Past Surgical History Past Surgical History  Procedure Date  . Laceration repair 1966    "right upper arm"  . Dilation and curettage of uterus 1990's  . Finger reattachment 1973    "right ring finger"    Home Meds: Prior to Admission medications   Medication Sig Start Date End Date Taking? Authorizing Provider  albuterol (PROVENTIL HFA;VENTOLIN HFA) 108 (90 BASE) MCG/ACT inhaler Inhale 2 puffs into the lungs every 6 (six) hours as needed. For shortness of breath.   Yes Historical Provider, MD  aspirin 81 MG chewable tablet Chew 81 mg by mouth daily.   Yes Historical Provider, MD  atenolol (TENORMIN) 50 MG tablet Take 50 mg by mouth daily.   Yes Historical Provider, MD  cloNIDine (CATAPRES) 0.1 MG tablet Take 0.1 mg by mouth daily.   Yes Historical Provider, MD  fish oil-omega-3 fatty acids 1000 MG capsule Take 1 g by mouth daily.   Yes Historical Provider, MD  furosemide (LASIX) 40 MG tablet Take 20-40 mg by mouth 2 (two) times daily. Take 1 tablet in the morning and take one-half of a tablet every night at bedtime.   Yes Historical Provider, MD  predniSONE (STERAPRED UNI-PAK) 10 MG tablet Take 10-60 mg by mouth daily. Take  for 6 days. Take 6 tablets on Day 1, take 5 tablets on Day 2, take 4 tablets on Day 3, take 3 tablets on Day 4, take 2 tablets on Day 5, and take 1 tablet on Day 6.   Yes Historical Provider, MD  azithromycin (ZITHROMAX) 500 MG tablet Take 1 tablet (500 mg total) by mouth daily. 04/22/12 04/27/12  Marinda Elk, MD    Allergies: Codeine and Latex  Social History:  History   Social History  . Marital Status: Married    Spouse Name: N/A    Number of Children: N/A  . Years of Education: N/A   Occupational History  . Not on file.   Social History Main Topics  . Smoking status: Current Some Day Smoker --  0.2 packs/day for 40 years    Types: Cigarettes  . Smokeless tobacco: Never Used   Comment: 04/20/12 "a pack of cigarettes will last me a minimum of 2 wk"  . Alcohol Use: No  . Drug Use: No  . Sexually Active: Not Currently   Other Topics Concern  . Not on file   Social History Narrative  . No narrative on file   Family History:  Family History  Problem Relation Age of Onset  . Allergies    . Hypertension      Review of Systems: Pertinent items are noted in HPI. All other systems reviewed and reported as negative.   Physical Exam: Blood pressure 119/63, pulse 82, temperature 99.3 F (37.4 C), temperature source Oral, resp. rate 20, SpO2 95.00%. General appearance: alert, cooperative, appears stated age, no distress and morbidly obese Head: Normocephalic, without obvious abnormality, atraumatic Eyes: negative, conjunctivae/corneas clear. PERRL, EOM's intact. Fundi benign. Nose: Nares normal. Septum midline. Mucosa normal. No drainage or sinus tenderness., no discharge Throat: Dry mucous membranes, poor dentition, gingivitis Neck: no adenopathy, no carotid bruit, no JVD, supple, symmetrical, trachea midline and thyroid not enlarged, symmetric, no tenderness/mass/nodules Lungs: Diminished breath sounds at left base heard posteriorly, otherwise no wheezing heard Heart: regular rate and rhythm, S1, S2 normal, no murmur, click, rub or gallop Abdomen: soft, non-tender; bowel sounds normal; no masses,  no organomegaly and Morbidly obese  Extremities: edema 1+ edema bilateral lower extremities - symmetric, no calf pain or tenderness Skin: Skin color, texture, turgor normal. No rashes or lesions Neurologic: Alert and oriented X 3, normal strength and tone. Normal symmetric reflexes. Normal coordination and gait  Lab  And Imaging results:  Results for orders placed during the hospital encounter of 04/27/12 (from the past 24 hour(s))  CBC     Status: Abnormal   Collection Time    04/27/12  9:37 AM      Component Value Range   WBC 19.8 (*) 4.0 - 10.5 (K/uL)   RBC 5.33 (*) 3.87 - 5.11 (MIL/uL)   Hemoglobin 16.5 (*) 12.0 - 15.0 (g/dL)   HCT 40.9 (*) 81.1 - 46.0 (%)   MCV 92.9  78.0 - 100.0 (fL)   MCH 31.0  26.0 - 34.0 (pg)   MCHC 33.3  30.0 - 36.0 (g/dL)   RDW 91.4  78.2 - 95.6 (%)   Platelets 292  150 - 400 (K/uL)  DIFFERENTIAL     Status: Abnormal   Collection Time   04/27/12  9:37 AM      Component Value Range   Neutrophils Relative 85 (*) 43 - 77 (%)   Neutro Abs 16.8 (*) 1.7 - 7.7 (K/uL)   Lymphocytes Relative 6 (*) 12 -  46 (%)   Lymphs Abs 1.1  0.7 - 4.0 (K/uL)   Monocytes Relative 9  3 - 12 (%)   Monocytes Absolute 1.9 (*) 0.1 - 1.0 (K/uL)   Eosinophils Relative 0  0 - 5 (%)   Eosinophils Absolute 0.0  0.0 - 0.7 (K/uL)   Basophils Relative 0  0 - 1 (%)   Basophils Absolute 0.0  0.0 - 0.1 (K/uL)  BASIC METABOLIC PANEL     Status: Abnormal   Collection Time   04/27/12  9:37 AM      Component Value Range   Sodium 134 (*) 135 - 145 (mEq/L)   Potassium 4.1  3.5 - 5.1 (mEq/L)   Chloride 98  96 - 112 (mEq/L)   CO2 28  19 - 32 (mEq/L)   Glucose, Bld 148 (*) 70 - 99 (mg/dL)   BUN 9  6 - 23 (mg/dL)   Creatinine, Ser 4.09  0.50 - 1.10 (mg/dL)   Calcium 9.7  8.4 - 81.1 (mg/dL)   GFR calc non Af Amer >90  >90 (mL/min)   GFR calc Af Amer >90  >90 (mL/min)     Impression   *Healthcare-associated pneumonia  HTN (hypertension)  DM (diabetes mellitus) type 2   COPD (chronic obstructive pulmonary disease)  Hemoptysis  CHF (congestive heart failure)  Obesity  Hypoxia  Dyspnea on exertion  Smoker  Leukocytosis  Hyponatremia  Plan  Admit to telemetry, cycle cardiac enzymes to rule out myocardial ischemia, supplemental oxygen, treat for HCAP per protocol, nebs every 4 hours, check BNP, echocardiogram, smoking cessation consultation,  Monitor blood glucose, provide supplemental insulin, IV steroids, initially and then switch to po for taper, Pt appears to be  high risk for sleep apnea and may need outpatient sleep study arranged.  Please see orders and follow hospital course.    Standley Dakins MD [facility] [place], [place] [phone] 04/27/2012, 2:59 PM

## 2012-04-27 NOTE — ED Notes (Addendum)
Floor nurse unable to take report at the time. Nurse to call back. 

## 2012-04-27 NOTE — ED Provider Notes (Signed)
History     CSN: [id number]  Arrival date & time 04/27/12  0826   First MD Initiated Contact with Patient 04/27/12 [phone]      Chief Complaint  Patient presents with  . Shortness of Breath  . Hematemesis    (Consider location/radiation/quality/duration/timing/severity/associated sxs/prior treatment) HPI Patient presents emergency department with shortness of breath, cough, and hemoptysis.  Patient, states that she started having these symptoms 3 days ago.  She states that her coughing of blood started last night.  Patient denies fevers, chest pain, abdominal pain, nausea, vomiting, diarrhea, dizziness, syncope, or nasal congestion.  Patient was seen by her primary care doctor on Monday and no other medicines were started.  Patient states that she was recently discharged from the hospital last week. Past Medical History  Diagnosis Date  . Hypertension   . Multiple allergies   . Hyperlipidemia   . Peripheral vascular disease   . Heart murmur   . Angina   . COPD (chronic obstructive pulmonary disease) 04/20/12    "just dx'd with this today"  . Asthma   . Bronchitis 04/20/12    "just got over 3rd bout this winter ~ 6-8 wk ago"  . Shortness of breath 04/20/12    "all the time"  . Type II diabetes mellitus     "borderline"  . Anemia   . H/O hiatal hernia   . Migraines   . Arthritis     "my knees"  . Panic attacks     Past Surgical History  Procedure Date  . Laceration repair 1966    "right upper arm"  . Dilation and curettage of uterus 1990's  . Finger reattachment 1973    "right ring finger"    Family History  Problem Relation Age of Onset  . Allergies    . Hypertension      History  Substance Use Topics  . Smoking status: Current Some Day Smoker -- 0.2 packs/day for 40 years    Types: Cigarettes  . Smokeless tobacco: Never Used   Comment: 04/20/12 "a pack of cigarettes will last me a minimum of 2 wk"  . Alcohol Use: No    OB History    Grav Para Term Preterm  Abortions TAB SAB Ect Mult Living                  Review of Systems All other systems negative except as documented in the HPI. All pertinent positives and negatives as reviewed in the HPI.  Allergies  Codeine and Latex  Home Medications   Current Outpatient Rx  Name Route Sig Dispense Refill  . ALBUTEROL SULFATE HFA 108 (90 BASE) MCG/ACT IN AERS Inhalation Inhale 2 puffs into the lungs every 6 (six) hours as needed. For shortness of breath.    . ASPIRIN 81 MG PO CHEW Oral Chew 81 mg by mouth daily.    . ATENOLOL 50 MG PO TABS Oral Take 50 mg by mouth daily.    Marland Kitchen CLONIDINE HCL 0.1 MG PO TABS Oral Take 0.1 mg by mouth daily.    . OMEGA-3 FATTY ACIDS 1000 MG PO CAPS Oral Take 1 g by mouth daily.    . FUROSEMIDE 40 MG PO TABS Oral Take 20-40 mg by mouth 2 (two) times daily. Take 1 tablet in the morning and take one-half of a tablet every night at bedtime.    Marland Kitchen PREDNISONE (PAK) 10 MG PO TABS Oral Take 10-60 mg by mouth daily. Take for 6 days. Take  6 tablets on Day 1, take 5 tablets on Day 2, take 4 tablets on Day 3, take 3 tablets on Day 4, take 2 tablets on Day 5, and take 1 tablet on Day 6.    . AZITHROMYCIN 500 MG PO TABS Oral Take 1 tablet (500 mg total) by mouth daily. 3 tablet 0    BP 105/48  Pulse 61  Temp(Src) 99.7 F (37.6 C) (Oral)  Resp 18  SpO2 93%  Physical Exam  Nursing note and vitals reviewed. Constitutional: She is oriented to person, place, and time. She appears well-developed and well-nourished. No distress.  HENT:  Head: Normocephalic and atraumatic.  Mouth/Throat: Oropharynx is clear and moist.  Eyes: Pupils are equal, round, and reactive to light.  Cardiovascular: Normal rate and regular rhythm.  Exam reveals no gallop and no friction rub.   No murmur heard. Pulmonary/Chest: She has wheezes. She has no rales.  Neurological: She is alert and oriented to person, place, and time.  Skin: Skin is warm and dry. No rash noted.    ED Course  Procedures  (including critical care time)  Labs Reviewed  CBC - Abnormal; Notable for the following:    WBC 19.8 (*)    RBC 5.33 (*)    Hemoglobin 16.5 (*)    HCT 49.5 (*)    All other components within normal limits  DIFFERENTIAL - Abnormal; Notable for the following:    Neutrophils Relative 85 (*)    Neutro Abs 16.8 (*)    Lymphocytes Relative 6 (*)    Monocytes Absolute 1.9 (*)    All other components within normal limits  BASIC METABOLIC PANEL - Abnormal; Notable for the following:    Sodium 134 (*)    Glucose, Bld 148 (*)    All other components within normal limits  URINALYSIS, ROUTINE W REFLEX MICROSCOPIC   Dg Chest 2 View  04/27/2012  *RADIOLOGY REPORT*  Clinical Data: Cough.  History of asthma and hypertension.  CHEST - 2 VIEW  Comparison: Chest radiographs 09/24/2005 and 04/20/2012.  Chest CT 09/24/2005.  Findings: The heart is enlarged.  There is chronic vascular congestion.  There appears to be increased scarring at the left apex.  In addition, there is new retrocardiac opacity which is best seen on the lateral view overlapping the mid thoracic spine.  No significant pleural effusion is seen.  There are diffuse osteophytes of the thoracic spine.  IMPRESSION:  1.  New patchy left lower lobe air space disease suspicious for pneumonia.  This should be followed radiographic clearing. 2.  Stable cardiomegaly and chronic vascular congestion.  Original Report Authenticated By: Gerrianne Scale, M.D.   The patient will be admitted for HAP. The patient is stable here in the ER as well.      MDM    Date: 04/27/2012  Rate: 61  Rhythm: normal sinus rhythm  QRS Axis: normal  Intervals: normal  ST/T Wave abnormalities: normal  Conduction Disutrbances:none  Narrative Interpretation:   Old EKG Reviewed: unchanged   MDM Reviewed: nursing note, vitals and previous chart Reviewed previous: labs, ECG and x-ray Interpretation: labs, ECG and x-ray Consults: admitting  MD          Carlyle Dolly, PA-C 04/27/12 1336

## 2012-04-27 NOTE — ED Notes (Signed)
Patient undressed and in a gown. Cardiac monitor, pulse oximetry, and blood pressure cuff on. 

## 2012-04-27 NOTE — Progress Notes (Signed)
Patient From home with husband, Full code  Admitted with health care associated PNA. Patient on 2L O2 dependent. Patient is full code and skin intact. Patient oriented to room and is aware she is in camera room

## 2012-04-27 NOTE — ED Notes (Signed)
Pt presents to department for evaluation of SOB and coughing up blood. States recent diagnosis of COPD. States shortness of breath has become progressively worse over the past several days. Wears 2L O2 at home. Chest soreness from coughing. Expiratory wheezing noted upon arrival to exam room. She is alert and oriented x4.

## 2012-04-27 NOTE — ED Notes (Signed)
Pt transported to medical bed. Floor nurse had no further questions.

## 2012-04-27 NOTE — ED Notes (Signed)
Meal tray ordered. Pt and family updated on poc.

## 2012-04-27 NOTE — Progress Notes (Signed)
ANTIBIOTIC CONSULT NOTE - INITIAL  Pharmacy Consult for Vancomycin/Levaquin/Cefepime Indication: pneumonia  Allergies  Allergen Reactions  . Codeine Hives, Itching and Other (See Comments)    "breathing problems"  . Latex Itching    Patient Measurements: Wt = 132 kg  Vital Signs: Temp: 99.7 F (37.6 C) (06/05 0840) Temp src: Oral (06/05 0840) BP: 120/54 mmHg (06/05 0900) Pulse Rate: 57  (06/05 1030)    Labs:  [facility] 04/27/12 0937  WBC 19.8*  HGB 16.5*  PLT 292  LABCREA --  CREATININE 0.71    Medical History: Past Medical History  Diagnosis Date  . Hypertension   . Multiple allergies   . Hyperlipidemia   . Peripheral vascular disease   . Heart murmur   . Angina   . COPD (chronic obstructive pulmonary disease) 04/20/12    "just dx'd with this today"  . Asthma   . Bronchitis 04/20/12    "just got over 3rd bout this winter ~ 6-8 wk ago"  . Shortness of breath 04/20/12    "all the time"  . Type II diabetes mellitus     "borderline"  . Anemia   . H/O hiatal hernia   . Migraines   . Arthritis     "my knees"  . Panic attacks    Assessment: 56 year old presents to the ED with SOB and coughing up blood.  Beginning empiric antibiotics for pneumonia.  Scr stable  Goal of Therapy:  Appropriate Levaquin / Cefepime dosing Vancomycin trough level 15 to 20 mcg / dl  Plan:  1) Vancomycin 0981 mg IV x 1 dose now then Vancomycin 1250 mg IV Q 12 hours 2) Continue Levaquin 750 mg IV Q 24 hours 3) Continue Cefepime 1 Gram IV Q 8 hours 4) Follow plan, Scr, cultures  Thank you. Okey Regal, PharmD Clinical Pharmacist [phone]  04/27/2012,11:00 AM

## 2012-04-28 ENCOUNTER — Inpatient Hospital Stay (HOSPITAL_COMMUNITY): Payer: Self-pay

## 2012-04-28 ENCOUNTER — Encounter (HOSPITAL_COMMUNITY): Payer: Self-pay | Admitting: Radiology

## 2012-04-28 DIAGNOSIS — J189 Pneumonia, unspecified organism: Secondary | ICD-10-CM

## 2012-04-28 DIAGNOSIS — J441 Chronic obstructive pulmonary disease with (acute) exacerbation: Secondary | ICD-10-CM

## 2012-04-28 DIAGNOSIS — F17200 Nicotine dependence, unspecified, uncomplicated: Secondary | ICD-10-CM

## 2012-04-28 DIAGNOSIS — I10 Essential (primary) hypertension: Secondary | ICD-10-CM

## 2012-04-28 LAB — COMPREHENSIVE METABOLIC PANEL
Albumin: 2.7 g/dL — ABNORMAL LOW (ref 3.5–5.2)
Alkaline Phosphatase: 91 U/L (ref 39–117)
BUN: 13 mg/dL (ref 6–23)
CO2: 23 mEq/L (ref 19–32)
Chloride: 98 mEq/L (ref 96–112)
GFR calc Af Amer: >90 mL/min (ref >90)
GFR calc non Af Amer: >90 mL/min (ref >90)
Glucose, Bld: 203 mg/dL — ABNORMAL HIGH (ref 70–99)
Potassium: 3.9 mEq/L (ref 3.5–5.1)
Total Bilirubin: 0.5 mg/dL (ref 0.3–1.2)

## 2012-04-28 LAB — GLUCOSE, CAPILLARY
Glucose-Capillary: 224 mg/dL — ABNORMAL HIGH (ref 70–99)
Glucose-Capillary: 161 mg/dL — ABNORMAL HIGH (ref 70–99)

## 2012-04-28 LAB — STREP PNEUMONIAE URINARY ANTIGEN: Strep Pneumo Urinary Antigen: NEGATIVE

## 2012-04-28 LAB — PRO B NATRIURETIC PEPTIDE: Pro B Natriuretic peptide (BNP): 9.3 pg/mL (ref 0–125)

## 2012-04-28 LAB — HIV ANTIBODY (ROUTINE TESTING W REFLEX): HIV: NONREACTIVE

## 2012-04-28 LAB — LEGIONELLA ANTIGEN, URINE: Legionella Antigen, Urine: NEGATIVE

## 2012-04-28 MED ORDER — SODIUM CHLORIDE 0.9 % IJ SOLN
10.0000 mL | INTRAMUSCULAR | Status: DC | PRN
  Administered 2012-04-29 – 2012-04-30 (×2): 10 mL

## 2012-04-28 MED ORDER — IOHEXOL 350 MG/ML SOLN
100.0000 mL | Freq: Once | INTRAVENOUS | Status: AC | PRN
  Administered 2012-04-28: 100 mL via INTRAVENOUS

## 2012-04-28 NOTE — Evaluation (Signed)
Physical Therapy Evaluation Patient Details Name: Margaret [name] MRN: [id number] DOB: 09-12-1956 Today's Date: 04/28/2012 Time: 8295-6213 PT Time Calculation (min): 22 min  PT Assessment / Plan / Recommendation Clinical Impression  pt adm with progressive SOB due to PNA.  Pt improving, but still dyspneic with sats in the low 90's on RA.  Pt is significantly deconditioned, mildly weak with little tolerance for activity.  Rec HHPT    PT Assessment  Patient needs continued PT services    Follow Up Recommendations  Home health PT    Barriers to Discharge Decreased caregiver support (husband works, but could stay out of work short time to [employer])      lEquipment Recommendations  None recommended by PT    Recommendations for Other Services     Frequency Min 3X/week    Precautions / Restrictions     Pertinent Vitals/Pain 90% at rest on RA/HR 57;  92% with amb. On RA/ HR 75      Mobility  Bed Mobility Bed Mobility: Not assessed Transfers Transfers: Sit to Stand;Stand to Sit Sit to Stand: 5: Supervision Stand to Sit: 5: Supervision Details for Transfer Assistance: no cuing needed, S only for safety Ambulation/Gait Ambulation/Gait Assistance: 5: Supervision Ambulation Distance (Feet): 180 Feet Assistive device: None Ambulation/Gait Assistance Details: generally steady gait, no deviations, but dyspnea 2+/4. AND SATS 94% ra Gait Pattern: Within Functional Limits (excessive lat w/shift) Stairs: No    Exercises     PT Diagnosis: Generalized weakness (decr. activity tolerance)  PT Problem List: Decreased strength;Decreased activity tolerance;Decreased mobility;Cardiopulmonary status limiting activity PT Treatment Interventions: Gait training;Stair training;Functional mobility training;Therapeutic activities;Patient/family education   PT Goals Acute Rehab PT Goals PT Goal Formulation: With patient Time For Goal Achievement: 05/05/12 Potential to Achieve Goals: Good Pt will  go Supine/Side to Sit: Independently PT Goal: Supine/Side to Sit - Progress: Goal set today Pt will Transfer Bed to Chair/Chair to Bed: Independently PT Transfer Goal: Bed to Chair/Chair to Bed - Progress: Goal set today Pt will Ambulate: >150 feet;Independently PT Goal: Ambulate - Progress: Goal set today Pt will Go Up / Down Stairs: 3-5 stairs;with modified independence;with rail(s) PT Goal: Up/Down Stairs - Progress: Goal set today  Visit Information  Last PT Received On: 10/28/12 Assistance Needed: +1    Subjective Data  Subjective: I had no intention of ending up here, but her I am Patient Stated Goal: I and back to work at the [employer]   Prior Functioning  Home Living Lives With: Spouse Available Help at Discharge: Family Type of Home: House Home Access: Stairs to enter Secretary/administrator of Steps: 3 Entrance Stairs-Rails: None Home Layout: One level Bathroom Shower/Tub: Forensic scientist: Standard Prior Function Level of Independence: Independent Able to Take Stairs?: Yes Driving: Yes Vocation: Full time employment Communication Communication: No difficulties    Cognition  Overall Cognitive Status: Appears within functional limits for tasks assessed/performed Arousal/Alertness: Awake/alert Orientation Level: Appears intact for tasks assessed Behavior During Session: [facility] for tasks performed    Extremity/Trunk Assessment Right Upper Extremity Assessment RUE ROM/Strength/Tone: Within functional levels Left Upper Extremity Assessment LUE ROM/Strength/Tone: Within functional levels Right Lower Extremity Assessment RLE ROM/Strength/Tone: Within functional levels Left Lower Extremity Assessment LLE ROM/Strength/Tone: Within functional levels (bil weak hip flexors and general weakness/deconditioning)   Balance Balance Balance Assessed: Yes Static Sitting Balance Static Sitting - Balance Support: No upper extremity supported;Feet  supported Static Sitting - Level of Assistance: 7: Independent Static Standing Balance Static Standing - Balance  Support: No upper extremity supported;During functional activity Static Standing - Level of Assistance: 7: Independent Dynamic Standing Balance Dynamic Standing - Balance Support: During functional activity (with Supervision)  End of Session PT - End of Session Activity Tolerance: Patient limited by fatigue;Patient tolerated treatment well Patient left: in chair;with call bell/phone within reach;with family/visitor present Nurse Communication: Mobility status   Mariyana Fulop, Eliseo Gum 04/28/2012, 4:34 PM  04/28/2012  [place] Bing, PT [phone] (pager)

## 2012-04-28 NOTE — Progress Notes (Signed)
PT Cancellation Note     Treatment cancelled today due to pt in a sterile procedure, will try to get back as able.  04/28/2012  [place]renton Bing, PT [phone] (pager)

## 2012-04-28 NOTE — Progress Notes (Signed)
Peripherally Inserted Central Catheter/Midline Placement  The IV Nurse has discussed with the patient and/or persons authorized to consent for the patient, the purpose of this procedure and the potential benefits and risks involved with this procedure.  The benefits include less needle sticks, lab draws from the catheter and patient may be discharged home with the catheter.  Risks include, but not limited to, infection, bleeding, blood clot (thrombus formation), and puncture of an artery; nerve damage and irregular heat beat.  Alternatives to this procedure were also discussed.  PICC/Midline Placement Documentation        Vevelyn Pat 04/28/2012, 10:32 AM

## 2012-04-28 NOTE — Progress Notes (Signed)
OT Cancellation Note  Treatment cancelled today due to patient receiving procedure or test.  Arwyn Besaw, OTR/L Pager [phone] 04/28/2012, 10:59 AM

## 2012-04-28 NOTE — Care Management Note (Unsigned)
    Page 1 of 1   04/28/2012     3:26:47 PM   CARE MANAGEMENT NOTE 04/28/2012  Patient:  Margaret [name], Margaret [name]   Account Number:  [id number]  Date Initiated:  04/28/2012  Documentation initiated by:  Letha Cape  Subjective/Objective Assessment:   dx pna  admit - lives with spouse, pta independent. Has home oxygen with [facility].     Action/Plan:   Anticipated DC Date:  05/01/2012   Anticipated DC Plan:  HOME/SELF CARE      DC Planning Services  CM consult      Choice offered to / List presented to:             Status of service:  In process, will continue to follow Medicare Important Message given?   (If response is "NO", the following Medicare IM given date fields will be blank) Date Medicare IM given:   Date Additional Medicare IM given:    Discharge Disposition:    Per UR Regulation:  Reviewed for med. necessity/level of care/duration of stay  If discussed at Long Length of Stay Meetings, dates discussed:    Comments:  04/28/12 15:25 Letha Cape RN, BSN [phone] patient lives with spouse, pta independent, has home oxygen with [facility], patient has transportation, patient was able to afford her medications for previous admission, but she is eligible for med ast if needed.  NCM will continue to follow for  d/c needs.

## 2012-04-28 NOTE — Progress Notes (Signed)
Inpatient Diabetes Program Recommendations  AACE/ADA: New Consensus Statement on Inpatient Glycemic Control (2009)  Target Ranges:  Prepandial:   less than 140 mg/dL      Peak postprandial:   less than 180 mg/dL (1-2 hours)      Critically ill patients:  140 - 180 mg/dL   Results for AUDRIELLE, VANKUREN (MRN [id number]) as of 04/28/2012 11:35  Ref. Range 04/27/2012 21:39 04/28/2012 07:58  Glucose-Capillary Latest Range: 70-99 mg/dL 782 (H) 956 (H)    Inpatient Diabetes Program Recommendations Correction (SSI): Consider Q 4 coverage during steroid therapy  Thank you  Piedad Climes [facility] Inpatient Diabetes Coordinator [phone]

## 2012-04-28 NOTE — Progress Notes (Signed)
PATIENT DETAILS Name: Margaret [name] Age: 57 y.o. Sex: female Date of Birth: 1956-01-11 Admit Date: 04/27/2012 ZOX:[id number] C, MD, MD  Subjective: Admitted with a hemoptysis and shortness of breath on 6/5 Today -6/6- now has just rusty colored sputum, SOB significantly better  Objective: Vital signs in last 24 hours: Filed Vitals:   04/28/12 0521 04/28/12 0833 04/28/12 1129 04/28/12 1445  BP: 143/75  149/72 111/65  Pulse: 57  61 50  Temp: 98.2 F (36.8 C)   97.6 F (36.4 C)  TempSrc: Oral   Oral  Resp: 22   20  Height:      Weight:      SpO2: 98% 98%  97%    Weight change:   Body mass index is 49.95 kg/(m^2).  Intake/Output from previous day:  Intake/Output Summary (Last 24 hours) at 04/28/12 1542 Last data filed at 04/28/12 1451  Gross per 24 hour  Intake    960 ml  Output   2550 ml  Net  -1590 ml    PHYSICAL EXAM: Gen Exam: Awake and alert with clear speech.   Neck: Supple, No JVD.   Chest: good air entry bilaterally, bibasilar rales and scattered rhonchi CVS: S1S2 Regular, no murmurs.  Abdomen: soft, BS +, non tender, non distended.  Extremities: no edema, 2 + edema lower extremities, warm to touch. Neurologic: Non Focal.   Skin: No Rash.   Wounds: N/A.    CONSULTS:  None  LAB RESULTS: CBC  Lab 04/27/12 1554 04/27/12 0937  WBC 20.7* 19.8*  HGB 13.4 16.5*  HCT 47.3* 49.5*  PLT 257 292  MCV 93.8 92.9  MCH 26.6 31.0  MCHC 28.3* 33.3  RDW 14.7 14.6  LYMPHSABS 1.3 1.1  MONOABS 2.0* 1.9*  EOSABS 0.0 0.0  BASOSABS 0.0 0.0  BANDABS -- --    Chemistries   Lab 04/28/12 0605 04/27/12 1554 04/27/12 0937  NA 136 -- 134*  K 3.9 -- 4.1  CL 98 -- 98  CO2 23 -- 28  GLUCOSE 203* -- 148*  BUN 13 -- 9  CREATININE 0.68 0.68 0.71  CALCIUM 10.1 -- 9.7  MG -- 1.7 --    CBG:  Lab 04/28/12 1216 04/28/12 0758 04/27/12 2139 04/27/12 1701  GLUCAP 172* 224* 273* 171*    GFR Estimated Creatinine Clearance: 106.1 ml/min (by C-G formula  based on Cr of 0.68).  Coagulation profile No results found for this basename: INR:5,PROTIME:5 in the last 168 hours  Cardiac Enzymes No results found for this basename: CK:3,CKMB:3,TROPONINI:3,MYOGLOBIN:3 in the last 168 hours  No components found with this basename: POCBNP:3 No results found for this basename: DDIMER:2 in the last 72 hours  Basename 04/27/12 1554  HGBA1C 6.7*   No results found for this basename: CHOL:2,HDL:2,LDLCALC:2,TRIG:2,CHOLHDL:2,LDLDIRECT:2 in the last 72 hours  Basename 04/27/12 1554  TSH 2.190  T4TOTAL --  T3FREE --  THYROIDAB --   No results found for this basename: VITAMINB12:2,FOLATE:2,FERRITIN:2,TIBC:2,IRON:2,RETICCTPCT:2 in the last 72 hours No results found for this basename: LIPASE:2,AMYLASE:2 in the last 72 hours  Urine Studies No results found for this basename: UACOL:2,UAPR:2,USPG:2,UPH:2,UTP:2,UGL:2,UKET:2,UBIL:2,UHGB:2,UNIT:2,UROB:2,ULEU:2,UEPI:2,UWBC:2,URBC:2,UBAC:2,CAST:2,CRYS:2,UCOM:2,BILUA:2 in the last 72 hours  MICROBIOLOGY: No results found for this or any previous visit (from the past 240 hour(s)).  RADIOLOGY STUDIES/RESULTS: Dg Chest 2 View  04/28/2012  *RADIOLOGY REPORT*  Clinical Data: Shortness of breath, cough and chest soreness.  CHEST - 2 VIEW  Comparison: 04/27/2012.  Findings: Trachea is midline.  Heart is mildly enlarged.  There is diffuse interstitial prominence and indistinctness,  similar to yesterday.  Consolidation is seen in the left lower lobe.  No pleural fluid.  IMPRESSION:  1.  Persistent left lower lobe air space consolidation, worrisome for pneumonia. Follow-up to clearing is recommended. 2.  Diffuse interstitial prominence and indistinctness is suggestive of edema.  Original Report Authenticated By: Reyes Ivan, M.D.   Dg Chest 2 View  04/27/2012  *RADIOLOGY REPORT*  Clinical Data: Cough.  History of asthma and hypertension.  CHEST - 2 VIEW  Comparison: Chest radiographs 09/24/2005 and 04/20/2012.  Chest CT  09/24/2005.  Findings: The heart is enlarged.  There is chronic vascular congestion.  There appears to be increased scarring at the left apex.  In addition, there is new retrocardiac opacity which is best seen on the lateral view overlapping the mid thoracic spine.  No significant pleural effusion is seen.  There are diffuse osteophytes of the thoracic spine.  IMPRESSION:  1.  New patchy left lower lobe air space disease suspicious for pneumonia.  This should be followed radiographic clearing. 2.  Stable cardiomegaly and chronic vascular congestion.  Original Report Authenticated By: Gerrianne Scale, M.D.   Dg Chest Port 1 View  04/20/2012  *RADIOLOGY REPORT*  Clinical Data: Shortness of breath, cough and chest tightness.  PORTABLE CHEST - 1 VIEW  Comparison: 06/24/2010  Findings: Cardiac enlargement with pulmonary vascular congestion and interstitial changes suggesting interstitial edema.  No blunting of costophrenic angles.  No focal consolidation.  No pneumothorax.  IMPRESSION: Cardiac enlargement with pulmonary vascular congestion and interstitial edema.  Original Report Authenticated By: Marlon Pel, M.D.    MEDICATIONS: Scheduled Meds:   . ipratropium  0.5 mg Nebulization Q4H   And  . albuterol  2.5 mg Nebulization Q4H  . antiseptic oral rinse  15 mL Mouth Rinse BID  . aspirin  81 mg Oral Daily  . atenolol  50 mg Oral Daily  . ceFEPime (MAXIPIME) IV  1 g Intravenous Q8H  . cloNIDine  0.1 mg Oral Daily  . heparin  5,000 Units Subcutaneous Q8H  . insulin aspart  0-20 Units Subcutaneous TID WC  . insulin aspart  0-5 Units Subcutaneous QHS  . levofloxacin (LEVAQUIN) IV  750 mg Intravenous Q24H  . methylPREDNISolone (SOLU-MEDROL) injection  40 mg Intravenous Q6H  . potassium chloride  30 mEq Oral BID  . sodium chloride  3 mL Intravenous Q12H  . vancomycin  1,250 mg Intravenous Q12H  . DISCONTD: furosemide  40 mg Intravenous Q12H   Continuous Infusions:  PRN Meds:.sodium  chloride, albuterol, iohexol, sodium chloride, sodium chloride  Antibiotics: Anti-infectives     Start     Dose/Rate Route Frequency Ordered Stop   04/27/12 2330   vancomycin (VANCOCIN) 1,250 mg in sodium chloride 0.9 % 250 mL IVPB        1,250 mg 166.7 mL/hr over 90 Minutes Intravenous Every 12 hours 04/27/12 1106     04/27/12 1400   ceFEPIme (MAXIPIME) 1 g in dextrose 5 % 50 mL IVPB        1 g 100 mL/hr over 30 Minutes Intravenous 3 times per day 04/27/12 1052 05/05/12 1359   04/27/12 1400   vancomycin (VANCOCIN) 1,250 mg in sodium chloride 0.9 % 250 mL IVPB        1,250 mg 166.7 mL/hr over 90 Minutes Intravenous  Once 04/27/12 1345 04/27/12 1528   04/27/12 1115   levofloxacin (LEVAQUIN) IVPB 750 mg        750 mg 100 mL/hr over 90  Minutes Intravenous  Once 04/27/12 1106 04/27/12 1426   04/27/12 1115   vancomycin (VANCOCIN) 2,500 mg in sodium chloride 0.9 % 500 mL IVPB  Status:  Discontinued        2,500 mg 250 mL/hr over 120 Minutes Intravenous  Once 04/27/12 1106 04/27/12 1344   04/27/12 1115   ceFEPIme (MAXIPIME) 1 g in dextrose 5 % 50 mL IVPB  Status:  Discontinued        1 g 100 mL/hr over 30 Minutes Intravenous  Once 04/27/12 1106 04/27/12 1519   04/27/12 1100   levofloxacin (LEVAQUIN) IVPB 750 mg        750 mg 100 mL/hr over 90 Minutes Intravenous Every 24 hours 04/27/12 1052 04/30/12 1059          Assessment/Plan: Principal Problem:  *Healthcare-associated pneumonia -continue with empiric antibiotics -is afebrile and with no leukocytosis -given hemoptysis-smoking history/recently diagnosed ?COPD-will get a CT chest to better evaluate  Active Problems: Hemoptysis -likely 2/2 to above, now with only "rusty" sputum -given long h/o smoking/persistant SOB/new oxygen requirement( from last week started on home O2)-will get a CT Chest, although unlikely to be PE-will get CT Angio and get that ruled out as well-as it can cause hemoptysis  Acute Hypoxic Resp  Failure -suspect multifactorial etiology-COPD/OSA/PNA -O2 dependent since last admit-which was just last week -still wheezing somewhat-long h/o smoking-so COPD remains in the differential -however does have leg edema-but only minimally elevated BNP-therefore will get a 2 D Echo to evaluate further to see if patient has any element of CHF -Doubt PE-but awaiting a CT Angio of the chest as well. Awaiting 2 D Echo -in interim continue with Antibiotics, Steroids, Scheduled Nebs   HTN (hypertension) -controlled with atenolol, Clonidine   DM (diabetes mellitus) -CBG's moderately well controlled -continue with SSI -last A1C-6.7-not on any meds at home, given obesity-will benefit from Metformin on discharge   COPD (chronic obstructive pulmonary disease) with exacerbation -some improvement per patient -continue with Steroids/nebs/antibiotics-will change from solumedrol to prednisone in am  Suspected OSA -Sleep study as outpatient   Morbid Obesity -have counseled extensively regarding importance of weight loss  Tobacco Abuse -have extensively counseled patient to quit   Disposition: -remain inpatient  DVT Prophylaxis: -Subcutaneous Heparin  Code Status: -Full Code  Jeoffrey Massed, MD  [facility] Pager [phone]  If 7PM-7AM, please contact night-coverage [email or web address] Password [email or web address] 04/28/2012, 3:42 PM   LOS: 1 day

## 2012-04-29 DIAGNOSIS — I509 Heart failure, unspecified: Secondary | ICD-10-CM

## 2012-04-29 DIAGNOSIS — J441 Chronic obstructive pulmonary disease with (acute) exacerbation: Secondary | ICD-10-CM

## 2012-04-29 DIAGNOSIS — I10 Essential (primary) hypertension: Secondary | ICD-10-CM

## 2012-04-29 DIAGNOSIS — F17200 Nicotine dependence, unspecified, uncomplicated: Secondary | ICD-10-CM

## 2012-04-29 DIAGNOSIS — J189 Pneumonia, unspecified organism: Secondary | ICD-10-CM

## 2012-04-29 LAB — CBC
HCT: 47.6 % — ABNORMAL HIGH (ref 36.0–46.0)
MCHC: 32.1 g/dL (ref 30.0–36.0)
MCV: 94.8 fL (ref 78.0–100.0)
Platelets: 314 10*3/uL (ref 150–400)
RDW: 14.6 % (ref 11.5–15.5)
WBC: 18.9 10*3/uL — ABNORMAL HIGH (ref 4.0–10.5)

## 2012-04-29 LAB — BASIC METABOLIC PANEL
BUN: 18 mg/dL (ref 6–23)
Chloride: 100 mEq/L (ref 96–112)
Creatinine, Ser: 0.68 mg/dL (ref 0.50–1.10)
GFR calc Af Amer: >90 mL/min (ref >90)
GFR calc non Af Amer: >90 mL/min (ref >90)

## 2012-04-29 LAB — GLUCOSE, CAPILLARY: Glucose-Capillary: 200 mg/dL — ABNORMAL HIGH (ref 70–99)

## 2012-04-29 MED ORDER — ALBUTEROL SULFATE (5 MG/ML) 0.5% IN NEBU
2.5000 mg | INHALATION_SOLUTION | Freq: Four times a day (QID) | RESPIRATORY_TRACT | Status: DC
  Administered 2012-04-29 – 2012-04-30 (×3): 2.5 mg via RESPIRATORY_TRACT
  Filled 2012-04-29 (×3): qty 0.5

## 2012-04-29 MED ORDER — IPRATROPIUM BROMIDE 0.02 % IN SOLN
0.5000 mg | Freq: Four times a day (QID) | RESPIRATORY_TRACT | Status: DC
  Administered 2012-04-29 – 2012-04-30 (×3): 0.5 mg via RESPIRATORY_TRACT
  Filled 2012-04-29 (×3): qty 2.5

## 2012-04-29 MED ORDER — FUROSEMIDE 20 MG PO TABS: 20.0000 mg | ORAL_TABLET | Freq: Two times a day (BID) | ORAL | Status: DC

## 2012-04-29 MED ORDER — FUROSEMIDE 40 MG PO TABS
40.0000 mg | ORAL_TABLET | Freq: Every day | ORAL | Status: DC
  Administered 2012-04-29 – 2012-04-30 (×2): 40 mg via ORAL
  Filled 2012-04-29 (×2): qty 1

## 2012-04-29 MED ORDER — PREDNISONE 10 MG PO TABS
60.0000 mg | ORAL_TABLET | Freq: Every day | ORAL | Status: DC
  Administered 2012-04-29 – 2012-04-30 (×2): 60 mg via ORAL
  Filled 2012-04-29 (×4): qty 1

## 2012-04-29 NOTE — Evaluation (Signed)
Occupational Therapy Evaluation Patient Details Name: Margaret [name] MRN: [id number] DOB: 07-25-1956 Today's Date: 04/29/2012 Time: 4098-1191 OT Time Calculation (min): 11 min  OT Assessment / Plan / Recommendation Clinical Impression  Pt. presents with PNA and with overall deconditioning. Pt. will benefit from skilled OT to increase functional independence to Mod I level at D/C home.    OT Assessment  Patient needs continued OT Services    Follow Up Recommendations  No OT follow up;Supervision - Intermittent    Barriers to Discharge None    Equipment Recommendations  None recommended by OT       Frequency  Min 2X/week    Precautions / Restrictions Precautions Precautions: Fall Restrictions Weight Bearing Restrictions: No       ADL  Eating/Feeding: Performed;Independent Where Assessed - Eating/Feeding: Edge of bed Grooming: Performed;Wash/dry hands;Wash/dry face;Set up Where Assessed - Grooming: Unsupported sitting Upper Body Bathing: Simulated;Set up Where Assessed - Upper Body Bathing: Unsupported sitting Lower Body Bathing: Simulated;Minimal assistance Where Assessed - Lower Body Bathing: Unsupported sit to stand Upper Body Dressing: Performed;Set up (don gown) Where Assessed - Upper Body Dressing: Unsupported sitting Lower Body Dressing: Simulated;Minimal assistance (assist to reach left sock) Where Assessed - Lower Body Dressing: Unsupported sitting Toilet Transfer: Simulated;Supervision/safety Toilet Transfer Method: Other (comment) (wheelchair ambulation) Toilet Transfer Equipment: Other (comment) (wheelchair) Toileting - Clothing Manipulation and Hygiene: Simulated;Set up Where Assessed - Toileting Clothing Manipulation and Hygiene: Sit to stand from 3-in-1 or toilet Tub/Shower Transfer Method: Not assessed Transfers/Ambulation Related to ADLs: Pt. provided with close supervision ~8'    OT Diagnosis: Generalized weakness  OT Problem List: Decreased  strength;Decreased activity tolerance;Impaired balance (sitting and/or standing);Decreased knowledge of use of DME or AE;Obesity OT Treatment Interventions: Self-care/ADL training;DME and/or AE instruction;Therapeutic activities;Patient/family education;Balance training   OT Goals Acute Rehab OT Goals OT Goal Formulation: With patient Time For Goal Achievement: 05/06/12 Potential to Achieve Goals: Good ADL Goals Pt Will Perform Grooming: with modified independence;Standing at sink ADL Goal: Grooming - Progress: Goal set today Pt Will Perform Lower Body Dressing: with modified independence;Sit to stand from bed ADL Goal: Lower Body Dressing - Progress: Goal set today Pt Will Transfer to Toilet: with modified independence;with DME;Ambulation;3-in-1 ADL Goal: Toilet Transfer - Progress: Goal set today Pt Will Perform Tub/Shower Transfer: Tub transfer;with modified independence;Transfer tub bench;with DME ADL Goal: Tub/Shower Transfer - Progress: Goal set today Additional ADL Goal #1: Pt. will recall and use 3 energy conservation techniques with above ADL tasks ADL Goal: Additional Goal #1 - Progress: Goal set today  Visit Information  Last OT Received On: 04/29/12 Assistance Needed: +1    Subjective Data  Subjective: "I need your help" Patient Stated Goal: "Go home"   Prior Functioning  Home Living Lives With: Spouse Available Help at Discharge: Family Type of Home: House Home Access: Stairs to enter Secretary/administrator of Steps: 3 Entrance Stairs-Rails: None Home Layout: One level Bathroom Shower/Tub: Forensic scientist: Standard Prior Function Level of Independence: Independent Able to Take Stairs?: Yes Driving: Yes Vocation: Full time employment Communication Communication: No difficulties       Extremity/Trunk Assessment Right Upper Extremity Assessment RUE ROM/Strength/Tone: Within functional levels RUE Sensation: WFL - Light Touch RUE  Coordination: WFL - gross/fine motor Left Upper Extremity Assessment LUE ROM/Strength/Tone: Within functional levels LUE Sensation: WFL - Light Touch LUE Coordination: WFL - gross/fine motor   Mobility Bed Mobility Bed Mobility: Supine to Sit Supine to Sit: 6: Modified independent (Device/Increase time);With rails Details for  Bed Mobility Assistance: Increased time Transfers Transfers: Sit to Stand;Stand to Sit Sit to Stand: 5: Supervision Stand to Sit: 5: Supervision Details for Transfer Assistance: no cuing needed, S only for safety         End of Session OT - End of Session Equipment Utilized During Treatment: Gait belt Activity Tolerance: Patient tolerated treatment well;Other (comment) (Transport present to take pt. for test) Patient left: Other (comment) (in wheelchair with transporter) Nurse Communication: Mobility status   Latori Beggs, OTR/L Pager [phone] 04/29/2012, 10:40 AM

## 2012-04-29 NOTE — ED Provider Notes (Signed)
Medical screening examination/treatment/procedure(s) were performed by non-physician practitioner and as supervising physician I was immediately available for consultation/collaboration.   Laray Anger, DO 04/29/12 1253

## 2012-04-29 NOTE — Progress Notes (Signed)
PATIENT DETAILS Name: Margaret [name] Age: 56 y.o. Sex: female Date of Birth: 05/29/1956 Admit Date: 04/27/2012 WUJ:[id number] C, MD, MD POA:   CONSULTS: None  PROCEDURES: none  Interim history:  CT angio done 6/6 confirming LLL pna. No PE. ? Of mild pulmonary edema No events overnight  Subjective: Feeling much better today. No frank blood in sputum. SOB improved. Able to ambulate around room this am without worsening sob.  Objective: Vital signs in last 24 hours: Temp:  [97.6 F (36.4 C)-98.2 F (36.8 C)] 98.2 F (36.8 C) (06/07 0511) Pulse Rate:  [50-52] 52  (06/07 0511) Resp:  [18-20] 20  (06/07 0511) BP: (111-141)/(65-80) 141/80 mmHg (06/07 0511) SpO2:  [93 %-98 %] 96 % (06/07 1215) Weight:  [134.6 kg (296 lb 11.8 oz)] 134.6 kg (296 lb 11.8 oz) (06/07 0511) Weight change: 2.966 kg (6 lb 8.6 oz) Last BM Date: 04/26/12  Intake/Output from previous day:  Intake/Output Summary (Last 24 hours) at 04/29/12 1238 Last data filed at 04/29/12 0800  Gross per 24 hour  Intake   1400 ml  Output   1500 ml  Net   -100 ml     Physical Exam:  Gen:   Awake, alert sitting upright in bed in NAD Cardiovascular:  S1S2 RRR, no m/r/g Respiratory: scattered rhonchi, no wheeze, no increased wob Gastrointestinal: abdomen obese, soft, NT/ND, BS+ Extremities: chronic bilateral LE edema 1+   Lab Results:  Lab 04/29/12 0532 04/27/12 1554 04/27/12 0937  HGB 15.3* 13.4 16.5*  HCT 47.6* 47.3* 49.5*  WBC 18.9* 20.7* 19.8*  PLT 314 257 292     Lab 04/29/12 0532 04/28/12 0605 04/27/12 1554 04/27/12 0937  NA 137 136 -- 134*  K 4.8 3.9 -- --  CL 100 98 -- 98  CO2 28 23 -- 28  GLUCOSE 190* 203* -- 148*  BUN 18 13 -- 9  CREATININE 0.68 0.68 0.68 0.71  CALCIUM 11.0* 10.1 -- 9.7  MG -- -- 1.7 --  PHOS -- -- 2.1* --    Studies/Results: Dg Chest 2 View  04/28/2012  *RADIOLOGY REPORT*  Clinical Data: Shortness of breath, cough and chest soreness.  CHEST - 2 VIEW   Comparison: 04/27/2012.  Findings: Trachea is midline.  Heart is mildly enlarged.  There is diffuse interstitial prominence and indistinctness, similar to yesterday.  Consolidation is seen in the left lower lobe.  No pleural fluid.  IMPRESSION:  1.  Persistent left lower lobe air space consolidation, worrisome for pneumonia. Follow-up to clearing is recommended. 2.  Diffuse interstitial prominence and indistinctness is suggestive of edema.  Original Report Authenticated By: Reyes Ivan, M.D.   Ct Angio Chest W/cm &/or Wo Cm  04/28/2012  *RADIOLOGY REPORT*  Clinical Data: Shortness of breath, cough, pneumonia.  Evaluate for pulmonary embolus.  CT ANGIOGRAPHY CHEST  Technique:  Multidetector CT imaging of the chest using the standard protocol during bolus administration of intravenous contrast. Multiplanar reconstructed images including MIPs were obtained and reviewed to evaluate the vascular anatomy.  Contrast: OMNIPAQUE IOHEXOL 350 MG/ML SOLN  Comparison: Chest radiograph 04/28/2012 and CT chest 09/24/2005.  Findings: No pulmonary embolus.  Mediastinal and hilar lymph nodes may be slightly more prominent than on 09/24/2005.  Index prevascular lymph node measures 11 mm (previously 8 mm).  Pulmonary arteries are enlarged, as is the heart.  No pericardial effusion. No axillary adenopathy.  Airspace consolidation and surrounding ground-glass are seen in the left lower lobe.  Added density throughout both lungs is  due, at least in part, to expiratory phase imaging.  Question mild septal thickening.  Airway is unremarkable.  Incidental imaging of the upper abdomen shows slight nodularity of the medial limb left adrenal gland.  Finding is unchanged from 09/24/2005.  Low attenuation lesion in the left kidney measures 2 cm and is incompletely imaged.  Liver is slightly decreased diffusely in attenuation.  No worrisome lytic or sclerotic lesions. Degenerative changes are seen in the spine.  IMPRESSION:  1.  No  pulmonary embolus. 2.  Left lower lobe pneumonia.  Follow-up to clearing is recommended. 3.  Slight interval increase in prominence of mediastinal and bihilar lymph nodes, which may be reactive in etiology. 4.  Difficult to exclude mild superimposed pulmonary edema. 5.  Hepatic steatosis.  Original Report Authenticated By: Reyes Ivan, M.D.    Medications: Scheduled Meds:   . ipratropium  0.5 mg Nebulization Q4H   And  . albuterol  2.5 mg Nebulization Q4H  . antiseptic oral rinse  15 mL Mouth Rinse BID  . aspirin  81 mg Oral Daily  . atenolol  50 mg Oral Daily  . cloNIDine  0.1 mg Oral Daily  . furosemide  40 mg Oral Daily  . heparin  5,000 Units Subcutaneous Q8H  . insulin aspart  0-20 Units Subcutaneous TID WC  . insulin aspart  0-5 Units Subcutaneous QHS  . levofloxacin (LEVAQUIN) IV  750 mg Intravenous Q24H  . potassium chloride  30 mEq Oral BID  . predniSONE  60 mg Oral Q breakfast  . sodium chloride  3 mL Intravenous Q12H  . DISCONTD: ceFEPime (MAXIPIME) IV  1 g Intravenous Q8H  . DISCONTD: furosemide  40 mg Intravenous Q12H  . DISCONTD: furosemide  20-40 mg Oral BID  . DISCONTD: methylPREDNISolone (SOLU-MEDROL) injection  40 mg Intravenous Q6H  . DISCONTD: vancomycin  1,250 mg Intravenous Q12H   Continuous Infusions:  PRN Meds:.sodium chloride, albuterol, iohexol, sodium chloride, sodium chloride Antibiotics: Anti-infectives     Start     Dose/Rate Route Frequency Ordered Stop   04/27/12 2330   vancomycin (VANCOCIN) 1,250 mg in sodium chloride 0.9 % 250 mL IVPB  Status:  Discontinued        1,250 mg 166.7 mL/hr over 90 Minutes Intravenous Every 12 hours 04/27/12 1106 04/29/12 0741   04/27/12 1400   ceFEPIme (MAXIPIME) 1 g in dextrose 5 % 50 mL IVPB  Status:  Discontinued        1 g 100 mL/hr over 30 Minutes Intravenous 3 times per day 04/27/12 1052 04/29/12 0943   04/27/12 1400   vancomycin (VANCOCIN) 1,250 mg in sodium chloride 0.9 % 250 mL IVPB        1,250  mg 166.7 mL/hr over 90 Minutes Intravenous  Once 04/27/12 1345 04/27/12 1528   04/27/12 1115   levofloxacin (LEVAQUIN) IVPB 750 mg        750 mg 100 mL/hr over 90 Minutes Intravenous  Once 04/27/12 1106 04/27/12 1426   04/27/12 1115   vancomycin (VANCOCIN) 2,500 mg in sodium chloride 0.9 % 500 mL IVPB  Status:  Discontinued        2,500 mg 250 mL/hr over 120 Minutes Intravenous  Once 04/27/12 1106 04/27/12 1344   04/27/12 1115   ceFEPIme (MAXIPIME) 1 g in dextrose 5 % 50 mL IVPB  Status:  Discontinued        1 g 100 mL/hr over 30 Minutes Intravenous  Once 04/27/12 1106 04/27/12 1519   04/27/12  1100   levofloxacin (LEVAQUIN) IVPB 750 mg        750 mg 100 mL/hr over 90 Minutes Intravenous Every 24 hours 04/27/12 1052 04/30/12 1059           Assessment/Plan:  Principal Problem:  *Healthcare-associated pneumonia Active Problems:  HTN (hypertension)  DM (diabetes mellitus)  COPD (chronic obstructive pulmonary disease)  Hemoptysis  CHF (congestive heart failure)  Obesity  Hypoxia  Dyspnea on exertion  Smoker  1. Healthcare-associated pneumonia  -Verified by CT, but with surrounding 'ground-glass' appearance. Follow-up imaging recommended when symptoms clear. -clinically improving with decreasing WBC, afebrile -Will narrow antibiotic coverage to Levaquin only (d/c Vancomycin and Cefepime)  Hemoptysis  -likely 2/2 to above, now with only "rusty" sputum  -no evidence of malignancy or PE on CT angio  Acute Hypoxic Resp Failure  -suspect multifactorial etiology-COPD/OSA/PNA  -O2 dependent since last admit-which was just last week  -wheezing has improved-long h/o smoking-so COPD remains in the differential  -however does have leg edema-but only minimally elevated BNP-2Decho results are pending to rule out any underlying CHF -in interim continue with Antibiotics, Steroids, Scheduled Nebs   HTN (hypertension)  -controlled with atenolol, Clonidine   DM (diabetes mellitus)   -CBG's moderately well controlled on steroids -continue with SSI  -last A1C-6.7 (6/13) not on any meds at home -given obesity-will benefit from Metformin on discharge   COPD (chronic obstructive pulmonary disease) with exacerbation  -some improvement per patient  -continue with nebs/antibiotics -Change to po prednisone today  Suspected OSA  -Sleep study as outpatient   Morbid Obesity  -have counseled extensively regarding importance of weight loss   Tobacco Abuse  -have extensively counseled patient to quit   Disposition:  -remain inpatient   DVT Prophylaxis:  Subcutaneous Heparin   Code Status:  Full Code   Cordelia Pen, NP-C [facility]  pgr [phone]    LOS: 2 days    04/29/2012, 12:38 PM   Attending  I have seen and examined the patient, I agree with the assessment and plan as outlined above  Dr Windell Norfolk

## 2012-04-29 NOTE — Progress Notes (Signed)
  Echocardiogram 2D Echocardiogram has been performed.  Cathie Beams Deneen 04/29/2012, 10:26 AM

## 2012-04-29 NOTE — Progress Notes (Signed)
Occupational Therapy Treatment Patient Details Name: Margaret [name] MRN: [id number] DOB: Mar 22, 1956 Today's Date: 04/29/2012 Time: 4098-1191 OT Time Calculation (min): 19 min  OT Assessment / Plan / Recommendation Comments on Treatment Session Pt. educated on energy conservation techniques to increase activity tolerance with ADLs    Follow Up Recommendations  No OT follow up;Supervision - Intermittent       Equipment Recommendations  None recommended by OT       Frequency Min 2X/week   Plan Discharge plan remains appropriate    Precautions / Restrictions Precautions Precautions: Fall       ADL  Grooming: Performed;Brushing hair;Set up;Supervision/safety Where Assessed - Grooming: Unsupported standing ADL Comments: Pt. educated on energy conservation techniques to use with ADLs to improve activity tolerance. Discussed techniques for completing tub transfer with use of tub seat at home. Pt. completed washing hair standing at sink ~45mins then needed seated rest break due to fatigue.      OT Goals Acute Rehab OT Goals OT Goal Formulation: With patient Time For Goal Achievement: 05/06/12 Potential to Achieve Goals: Good ADL Goals Pt Will Perform Grooming: with modified independence;Standing at sink ADL Goal: Grooming - Progress: Progressing toward goals Pt Will Perform Tub/Shower Transfer: Tub transfer;with modified independence;Transfer tub bench;with DME ADL Goal: Tub/Shower Transfer - Progress: Progressing toward goals Additional ADL Goal #1: Pt. will recall and use 3 energy conservation techniques with above ADL tasks ADL Goal: Additional Goal #1 - Progress: Progressing toward goals  Visit Information  Last OT Received On: 04/29/12 Assistance Needed: +1          Cognition  Overall Cognitive Status: Appears within functional limits for tasks assessed/performed Arousal/Alertness: Awake/alert Orientation Level: Appears intact for tasks assessed Behavior During  Session: [facility] for tasks performed    Mobility Bed Mobility Bed Mobility: Supine to Sit Supine to Sit: 6: Modified independent (Device/Increase time);With rails Details for Bed Mobility Assistance: Increased time Transfers Transfers: Not assessed         End of Session OT - End of Session Equipment Utilized During Treatment: Gait belt Activity Tolerance: Patient tolerated treatment well;Other (comment) Patient left: in bed;with call bell/phone within reach Nurse Communication: Mobility status   Anthem Frazer, OTR/L Pager [phone] 04/29/2012, 3:17 PM

## 2012-04-30 DIAGNOSIS — J441 Chronic obstructive pulmonary disease with (acute) exacerbation: Secondary | ICD-10-CM

## 2012-04-30 DIAGNOSIS — J189 Pneumonia, unspecified organism: Secondary | ICD-10-CM

## 2012-04-30 DIAGNOSIS — F17200 Nicotine dependence, unspecified, uncomplicated: Secondary | ICD-10-CM

## 2012-04-30 DIAGNOSIS — I10 Essential (primary) hypertension: Secondary | ICD-10-CM

## 2012-04-30 LAB — BASIC METABOLIC PANEL
BUN: 22 mg/dL (ref 6–23)
CO2: 29 mEq/L (ref 19–32)
Chloride: 100 mEq/L (ref 96–112)
Creatinine, Ser: 0.78 mg/dL (ref 0.50–1.10)
GFR calc Af Amer: >90 mL/min (ref >90)
Potassium: 4.4 mEq/L (ref 3.5–5.1)

## 2012-04-30 LAB — CBC
HCT: 47.6 % — ABNORMAL HIGH (ref 36.0–46.0)
Hemoglobin: 15.3 g/dL — ABNORMAL HIGH (ref 12.0–15.0)
MCH: 30.5 pg (ref 26.0–34.0)
MCHC: 32.1 g/dL (ref 30.0–36.0)
MCV: 94.8 fL (ref 78.0–100.0)
Platelets: 327 10*3/uL (ref 150–400)
RBC: 5.02 MIL/uL (ref 3.87–5.11)
RDW: 14.5 % (ref 11.5–15.5)
WBC: 19.7 10*3/uL — ABNORMAL HIGH (ref 4.0–10.5)

## 2012-04-30 LAB — GLUCOSE, CAPILLARY
Glucose-Capillary: 118 mg/dL — ABNORMAL HIGH (ref 70–99)
Glucose-Capillary: 174 mg/dL — ABNORMAL HIGH (ref 70–99)
Glucose-Capillary: 139 mg/dL — ABNORMAL HIGH (ref 70–99)

## 2012-04-30 MED ORDER — METFORMIN HCL 500 MG PO TABS: 500.0000 mg | ORAL_TABLET | Freq: Two times a day (BID) | ORAL | Status: DC

## 2012-04-30 MED ORDER — LEVOFLOXACIN 750 MG PO TABS
750.0000 mg | ORAL_TABLET | Freq: Every day | ORAL | Status: DC
  Administered 2012-04-30: 750 mg via ORAL
  Filled 2012-04-30: qty 1

## 2012-04-30 MED ORDER — TIOTROPIUM BROMIDE MONOHYDRATE 18 MCG IN CAPS
18.0000 ug | ORAL_CAPSULE | Freq: Every day | RESPIRATORY_TRACT | Status: DC
  Administered 2012-04-30: 18 ug via RESPIRATORY_TRACT
  Filled 2012-04-30: qty 5

## 2012-04-30 MED ORDER — TIOTROPIUM BROMIDE MONOHYDRATE 18 MCG IN CAPS: 18.0000 ug | ORAL_CAPSULE | Freq: Every day | RESPIRATORY_TRACT | Status: DC

## 2012-04-30 MED ORDER — IPRATROPIUM BROMIDE 0.02 % IN SOLN: 0.5000 mg | RESPIRATORY_TRACT | Status: DC | PRN

## 2012-04-30 MED ORDER — PREDNISONE 10 MG PO TABS: ORAL_TABLET | ORAL | Status: DC

## 2012-04-30 MED ORDER — ALBUTEROL SULFATE HFA 108 (90 BASE) MCG/ACT IN AERS: 2.0000 | INHALATION_SPRAY | RESPIRATORY_TRACT | Status: DC | PRN

## 2012-04-30 MED ORDER — PREDNISONE 20 MG PO TABS
40.0000 mg | ORAL_TABLET | Freq: Every day | ORAL | Status: DC
  Filled 2012-04-30: qty 2

## 2012-04-30 MED ORDER — FLUTICASONE-SALMETEROL 250-50 MCG/DOSE IN AEPB
1.0000 | INHALATION_SPRAY | Freq: Two times a day (BID) | RESPIRATORY_TRACT | Status: DC
  Administered 2012-04-30: 1 via RESPIRATORY_TRACT
  Filled 2012-04-30: qty 14

## 2012-04-30 MED ORDER — ALBUTEROL SULFATE (2.5 MG/3ML) 0.083% IN NEBU: 2.5000 mg | INHALATION_SOLUTION | Freq: Four times a day (QID) | RESPIRATORY_TRACT | Status: DC | PRN

## 2012-04-30 MED ORDER — ALBUTEROL SULFATE (5 MG/ML) 0.5% IN NEBU
2.5000 mg | INHALATION_SOLUTION | Freq: Four times a day (QID) | RESPIRATORY_TRACT | Status: DC
  Administered 2012-04-30: 2.5 mg via RESPIRATORY_TRACT
  Filled 2012-04-30: qty 0.5

## 2012-04-30 MED ORDER — LEVOFLOXACIN 750 MG PO TABS: 750.0000 mg | ORAL_TABLET | Freq: Every day | ORAL | Status: AC

## 2012-04-30 MED ORDER — FLUTICASONE-SALMETEROL 250-50 MCG/DOSE IN AEPB: 1.0000 | INHALATION_SPRAY | Freq: Two times a day (BID) | RESPIRATORY_TRACT | Status: DC

## 2012-04-30 NOTE — Discharge Summary (Signed)
PATIENT DETAILS Name: Margaret [name] Age: 56 y.o. Sex: female Date of Birth: 1956-06-28 MRN: [id number]. Admit Date: 04/27/2012 Admitting Physician: Cleora Fleet, MD JYN:WGNFAO,ZHYQMVH C, MD, MD  PRIMARY DISCHARGE DIAGNOSIS:  Principal Problem:  *Healthcare-associated pneumonia Active Problems:  HTN (hypertension)  DM (diabetes mellitus)  COPD (chronic obstructive pulmonary disease) with exacerbation  Hemoptysis  CHF (congestive heart failure)  Obesity  Hypoxia  Dyspnea on exertion  Smoker      PAST MEDICAL HISTORY: Past Medical History  Diagnosis Date  . Hypertension   . Multiple allergies   . Hyperlipidemia   . Peripheral vascular disease   . Heart murmur   . Angina   . COPD (chronic obstructive pulmonary disease) 04/20/12    "just dx'd with this today"  . Asthma   . Bronchitis 04/20/12    "just got over 3rd bout this winter ~ 6-8 wk ago"  . Type II diabetes mellitus     "borderline"  . Anemia   . H/O hiatal hernia   . Arthritis     "my knees"  . Pneumonia 04/2012    "first time ever"  . Shortness of breath 04/27/12    "all the time"  . Migraines     "last one was maybe 2010 or 2011"  . Panic attacks 04/20/12    "had breathing problem; turned into full fledged panic attack"  . CHF (congestive heart failure)     DISCHARGE MEDICATIONS: Medication List  As of 04/30/2012  2:51 PM   STOP taking these medications         azithromycin 500 MG tablet         TAKE these medications         albuterol 108 (90 BASE) MCG/ACT inhaler   Commonly known as: PROVENTIL HFA;VENTOLIN HFA   Inhale 2 puffs into the lungs every 4 (four) hours as needed for wheezing or shortness of breath. For shortness of breath.      albuterol (2.5 MG/3ML) 0.083% nebulizer solution   Commonly known as: PROVENTIL   Take 3 mLs (2.5 mg total) by nebulization every 6 (six) hours as needed for wheezing.      aspirin 81 MG chewable tablet   Chew 81 mg by mouth daily.      atenolol 50  MG tablet   Commonly known as: TENORMIN   Take 50 mg by mouth daily.      cloNIDine 0.1 MG tablet   Commonly known as: CATAPRES   Take 0.1 mg by mouth daily.      fish oil-omega-3 fatty acids 1000 MG capsule   Take 1 g by mouth daily.      Fluticasone-Salmeterol 250-50 MCG/DOSE Aepb   Commonly known as: ADVAIR   Inhale 1 puff into the lungs 2 (two) times daily.      furosemide 40 MG tablet   Commonly known as: LASIX   Take 20-40 mg by mouth 2 (two) times daily. Take 1 tablet in the morning and take one-half of a tablet every night at bedtime.      levofloxacin 750 MG tablet   Commonly known as: LEVAQUIN   Take 1 tablet (750 mg total) by mouth daily.      metFORMIN 500 MG tablet   Commonly known as: GLUCOPHAGE   Take 1 tablet (500 mg total) by mouth 2 (two) times daily with a meal.      predniSONE 10 MG tablet   Commonly known as: DELTASONE   Take 4 tablets  for 4 days, then  Take 3 tablets for 4 days, then  Take 2 tablets for 4 days, then  Take 1 tablet for 2 days and then discontinue      tiotropium 18 MCG inhalation capsule   Commonly known as: SPIRIVA   Place 1 capsule (18 mcg total) into inhaler and inhale daily.           BRIEF HPI:  See H&P, Labs, Consult and Test reports for all details in brief, Margaret [name] is an 56 y.o. female morbidly obese smoker with COPD who was recently discharged from the hospital with an acute exacerbation of COPD on home oxygen presented to the emergency department today reporting progressive shortness of breath cough and wheezing.The patient was concerned because she started coughing up small amounts of blood. The patient reports that she completed azithromycin after being discharge. She reports that she began to develop progressive shortness of breath and cough. She reports that only small distances exacerbated the shortness of breath.   CONSULTATIONS:   None  PERTINENT RADIOLOGIC STUDIES: Dg Chest 2 View  04/28/2012   *RADIOLOGY REPORT*  Clinical Data: Shortness of breath, cough and chest soreness.  CHEST - 2 VIEW  Comparison: 04/27/2012.  Findings: Trachea is midline.  Heart is mildly enlarged.  There is diffuse interstitial prominence and indistinctness, similar to yesterday.  Consolidation is seen in the left lower lobe.  No pleural fluid.  IMPRESSION:  1.  Persistent left lower lobe air space consolidation, worrisome for pneumonia. Follow-up to clearing is recommended. 2.  Diffuse interstitial prominence and indistinctness is suggestive of edema.  Original Report Authenticated By: Reyes Ivan, M.D.   Dg Chest 2 View  04/27/2012  *RADIOLOGY REPORT*  Clinical Data: Cough.  History of asthma and hypertension.  CHEST - 2 VIEW  Comparison: Chest radiographs 09/24/2005 and 04/20/2012.  Chest CT 09/24/2005.  Findings: The heart is enlarged.  There is chronic vascular congestion.  There appears to be increased scarring at the left apex.  In addition, there is new retrocardiac opacity which is best seen on the lateral view overlapping the mid thoracic spine.  No significant pleural effusion is seen.  There are diffuse osteophytes of the thoracic spine.  IMPRESSION:  1.  New patchy left lower lobe air space disease suspicious for pneumonia.  This should be followed radiographic clearing. 2.  Stable cardiomegaly and chronic vascular congestion.  Original Report Authenticated By: Gerrianne Scale, M.D.   Ct Angio Chest W/cm &/or Wo Cm  04/28/2012  *RADIOLOGY REPORT*  Clinical Data: Shortness of breath, cough, pneumonia.  Evaluate for pulmonary embolus.  CT ANGIOGRAPHY CHEST  Technique:  Multidetector CT imaging of the chest using the standard protocol during bolus administration of intravenous contrast. Multiplanar reconstructed images including MIPs were obtained and reviewed to evaluate the vascular anatomy.  Contrast: OMNIPAQUE IOHEXOL 350 MG/ML SOLN  Comparison: Chest radiograph 04/28/2012 and CT chest 09/24/2005.   Findings: No pulmonary embolus.  Mediastinal and hilar lymph nodes may be slightly more prominent than on 09/24/2005.  Index prevascular lymph node measures 11 mm (previously 8 mm).  Pulmonary arteries are enlarged, as is the heart.  No pericardial effusion. No axillary adenopathy.  Airspace consolidation and surrounding ground-glass are seen in the left lower lobe.  Added density throughout both lungs is due, at least in part, to expiratory phase imaging.  Question mild septal thickening.  Airway is unremarkable.  Incidental imaging of the upper abdomen shows slight nodularity of the  medial limb left adrenal gland.  Finding is unchanged from 09/24/2005.  Low attenuation lesion in the left kidney measures 2 cm and is incompletely imaged.  Liver is slightly decreased diffusely in attenuation.  No worrisome lytic or sclerotic lesions. Degenerative changes are seen in the spine.  IMPRESSION:  1.  No pulmonary embolus. 2.  Left lower lobe pneumonia.  Follow-up to clearing is recommended. 3.  Slight interval increase in prominence of mediastinal and bihilar lymph nodes, which may be reactive in etiology. 4.  Difficult to exclude mild superimposed pulmonary edema. 5.  Hepatic steatosis.  Original Report Authenticated By: Reyes Ivan, M.D.   Dg Chest Port 1 View  04/20/2012  *RADIOLOGY REPORT*  Clinical Data: Shortness of breath, cough and chest tightness.  PORTABLE CHEST - 1 VIEW  Comparison: 06/24/2010  Findings: Cardiac enlargement with pulmonary vascular congestion and interstitial changes suggesting interstitial edema.  No blunting of costophrenic angles.  No focal consolidation.  No pneumothorax.  IMPRESSION: Cardiac enlargement with pulmonary vascular congestion and interstitial edema.  Original Report Authenticated By: Marlon Pel, M.D.    PERTINENT LAB RESULTS: CBC:  Basename 04/30/12 0535 04/29/12 0532  WBC 19.7* 18.9*  HGB 15.3* 15.3*  HCT 47.6* 47.6*  PLT 327 314   CMET CMP       Component Value Date/Time   NA 138 04/30/2012 0535   K 4.4 04/30/2012 0535   CL 100 04/30/2012 0535   CO2 29 04/30/2012 0535   GLUCOSE 154* 04/30/2012 0535   BUN 22 04/30/2012 0535   CREATININE 0.78 04/30/2012 0535   CALCIUM 11.0* 04/30/2012 0535   PROT 6.6 04/28/2012 0605   ALBUMIN 2.7* 04/28/2012 0605   AST 11 04/28/2012 0605   ALT 23 04/28/2012 0605   ALKPHOS 91 04/28/2012 0605   BILITOT 0.5 04/28/2012 0605   GFRNONAA >90 04/30/2012 0535   GFRAA >90 04/30/2012 0535    GFR Estimated Creatinine Clearance: 105.7 ml/min (by C-G formula based on Cr of 0.78). No results found for this basename: LIPASE:2,AMYLASE:2 in the last 72 hours No results found for this basename: CKTOTAL:3,CKMB:3,CKMBINDEX:3,TROPONINI:3 in the last 72 hours No components found with this basename: POCBNP:3 No results found for this basename: DDIMER:2 in the last 72 hours  Basename 04/27/12 1554  HGBA1C 6.7*   No results found for this basename: CHOL:2,HDL:2,LDLCALC:2,TRIG:2,CHOLHDL:2,LDLDIRECT:2 in the last 72 hours  Basename 04/27/12 1554  TSH 2.190  T4TOTAL --  T3FREE --  THYROIDAB --   No results found for this basename: VITAMINB12:2,FOLATE:2,FERRITIN:2,TIBC:2,IRON:2,RETICCTPCT:2 in the last 72 hours Coags: No results found for this basename: PT:2,INR:2 in the last 72 hours Microbiology: Recent Results (from the past 240 hour(s))  CULTURE, BLOOD (ROUTINE X 2)     Status: Normal (Preliminary result)   Collection Time   04/27/12  3:55 PM      Component Value Range Status Comment   Specimen Description BLOOD HAND LEFT   Final    Special Requests BOTTLES DRAWN AEROBIC AND ANAEROBIC 10CC   Final    Culture  Setup Time [id number]   Final    Culture     Final    Value:        BLOOD CULTURE RECEIVED NO GROWTH TO DATE CULTURE WILL BE HELD FOR 5 DAYS BEFORE ISSUING A FINAL NEGATIVE REPORT   Report Status PENDING   Incomplete   CULTURE, BLOOD (ROUTINE X 2)     Status: Normal (Preliminary result)   Collection Time   04/27/12  4:02 PM  Component Value Range Status Comment   Specimen Description BLOOD ARM RIGHT   Final    Special Requests BOTTLES DRAWN AEROBIC AND ANAEROBIC 10CC   Final    Culture  Setup Time [id number]   Final    Culture     Final    Value:        BLOOD CULTURE RECEIVED NO GROWTH TO DATE CULTURE WILL BE HELD FOR 5 DAYS BEFORE ISSUING A FINAL NEGATIVE REPORT   Report Status PENDING   Incomplete      BRIEF HOSPITAL COURSE:  Healthcare-associated pneumonia  -Verified by CT, but with surrounding 'ground-glass' appearance.  Follow-up imaging recommended when symptoms clear-in 6-8 weeks -clinically improving,WBC elevated 2/2 to steroids, afebrile -On admission was empirically covered with Vanco/Cefepime and Levaquin, upon clinical improvement,  antibiotic coverage  Narrowed to Levaquin only-which patient will continue for 4 more days on discharge -She did have minimal hemoptysis on admission, now resolved with rusty colored sputum at times. -CTA chest-negative for PE-will need re-imaging of her chest to document clearance/resolution of PNA-given her long history of smoking. I have instructed the patient to convey this to her PCP, she claims understanding  Hemoptysis  -resolved -likely 2/2 to above, now with only "rusty" sputum  -no evidence of malignancy or PE on CT angio  Acute Hypoxic Resp Failure  -suspect multifactorial etiology-COPD exac/OSA/PNA  -O2 dependent since last admit-which was just last week  -significantly better, no wheezing currently -continue with O2 at discharge  COPD (chronic obstructive pulmonary disease) with exacerbation  -significantly better-lungs clear today -start Spiriva/Advair -as needed albuterol inhaler/nebs -second admission in matter of weeks-have referred patient to [facility]-office will call her with a appointment  HTN (hypertension)  -controlled with atenolol, Clonidine   DM (diabetes mellitus)  -CBG's moderately well controlled on steroids   -continue with SSI  -last A1C-6.7 (6/13) not on any meds at home  -given obesity-will benefit from Metformin on discharge   Suspected OSA  -Sleep study as outpatient-will defer to PCP/Pulmonary  Morbid Obesity  -have counseled extensively regarding importance of weight loss   Tobacco Abuse  -have extensively counseled patient to quit   TODAY-DAY OF DISCHARGE:  Subjective:   Lodema Pilot today has no headache,no chest abdominal pain,no new weakness tingling or numbness, feels much better wants to go home today.   Objective:   Blood pressure 129/89, pulse 83, temperature 98 F (36.7 C), temperature source Oral, resp. rate 18, height 5\' 4"  (1.626 m), weight 131.1 kg (289 lb 0.4 oz), SpO2 98.00%.  Intake/Output Summary (Last 24 hours) at 04/30/12 1451 Last data filed at 04/30/12 1300  Gross per 24 hour  Intake    720 ml  Output   1000 ml  Net   -280 ml    Exam Awake Alert, Oriented *3, No new F.N deficits, Normal affect [place].AT,PERRAL Supple Neck,No JVD, No cervical lymphadenopathy appriciated.  Symmetrical Chest wall movement, Good air movement bilaterally, CTAB RRR,No Gallops,Rubs or new Murmurs, No Parasternal Heave +ve B.Sounds, Abd Soft, Non tender, No organomegaly appriciated, No rebound -guarding or rigidity. No Cyanosis, Clubbing or edema, No new Rash or bruise  DISPOSITION: Home  DISCHARGE INSTRUCTIONS:    Follow-up Information    Follow up with Waymon Budge, MD. (Office will call with appointment)    Contact information:   [address] [employer] [place] [address] [phone]       Follow up with HOOPER,JEFFREY C, MD. Schedule an appointment as  soon as possible for a visit in 1 week.   Contact information:   [address] [place] [address] [phone]         Total Time spent on discharge equals 45 minutes.  SignedJeoffrey Massed 04/30/2012 2:51 PM

## 2012-04-30 NOTE — Progress Notes (Signed)
   CARE MANAGEMENT NOTE 04/30/2012  Patient:  Margaret [name], Margaret [name]   Account Number:  [id number]  Date Initiated:  04/28/2012  Documentation initiated by:  Letha Cape  Subjective/Objective Assessment:   dx pna  admit - lives with spouse, pta independent. Has home oxygen with [facility].     Action/Plan:   Anticipated DC Date:  05/01/2012   Anticipated DC Plan:  HOME/SELF CARE      DC Planning Services  CM consult  Medication Assistance      Choice offered to / List presented to:             Status of service:  Completed, signed off Medicare Important Message given?   (If response is "NO", the following Medicare IM given date fields will be blank) Date Medicare IM given:   Date Additional Medicare IM given:    Discharge Disposition:    Per UR Regulation:  Reviewed for med. necessity/level of care/duration of stay  If discussed at Long Length of Stay Meetings, dates discussed:    Comments:  04/30/2012 1430 Filled meds with [employer] fund. Contacted [facility] for nebulizer machine for home. Explained to pt that [employer] meds assistance fund can be used once per year. Isidoro Donning RN CCM Case Mgmt phone [phone]  04/28/12 15:25 Letha Cape RN, BSN [phone] patient lives with spouse, pta independent, has home oxygen with [facility], patient has transportation, patient was able to afford her medications for previous admission, but she is eligible for med ast if needed.  NCM will continue to follow for  d/c needs.

## 2012-04-30 NOTE — Progress Notes (Signed)
Patient to be discharged home today .PICC to be removed from Left upper arm by IV team. Discharge instructions reviewed with patient. Harless Litten.RN.

## 2012-04-30 NOTE — Plan of Care (Signed)
Problem: Discharge Progression Outcomes Goal: Barriers To Progression Addressed/Resolved Outcome: Not Applicable Date Met:  04/30/12 Patient did not have any barriers Goal: O2 sats at patient's baseline Outcome: Completed/Met Date Met:  04/30/12 Wears O2 at home and saturation baseline is a 93 Goal: Pain controlled with appropriate interventions Outcome: Not Applicable Date Met:  04/30/12 Patient has had no pain

## 2012-04-30 NOTE — Discharge Instructions (Signed)
Please ask you Primary Care Practitioner to repeat CT Scan or Chest Xray in 6-8 weeks time to see if pneumonia has resolved

## 2012-04-30 NOTE — Progress Notes (Signed)
Occupational Therapy Treatment Patient Details Name: Margaret [name] MRN: [id number] DOB: 11-09-1956 Today's Date: 04/30/2012 Time: 4098-1191 OT Time Calculation (min): 13 min  OT Assessment / Plan / Recommendation Comments on Treatment Session Pt. moving well and anticipates D/C possibly home today.    Follow Up Recommendations  No OT follow up;Supervision - Intermittent       Equipment Recommendations  None recommended by OT       Frequency Min 2X/week   Plan Discharge plan remains appropriate    Precautions / Restrictions Precautions Precautions: Fall Restrictions Weight Bearing Restrictions: No       ADL  Grooming: Performed;Modified independent;Wash/dry hands Where Assessed - Grooming: Unsupported standing Toilet Transfer: Performed;Modified independent Toilet Transfer Method: Sit to stand;Other (comment) (ambulation) Toilet Transfer Equipment: Regular height toilet Toileting - Clothing Manipulation and Hygiene: Performed;Independent Where Assessed - Toileting Clothing Manipulation and Hygiene: Sit on 3-in-1 or toilet Tub/Shower Transfer Method: Not assessed Transfers/Ambulation Related to ADLs: Pt. mod I ~15' ADL Comments: Pt. educated on breathing techniques to complete with activity to keep SpO2 up due to decreasing to 90% with mobility to bathroom on RA and after ~30 sec's increased to 95%. Pt. able to recall energy conservation techniques and encouraged pt. to continue mobility and ambulate around the unit with supervision to improve activity tolerance.      OT Goals Acute Rehab OT Goals OT Goal Formulation: With patient Time For Goal Achievement: 05/06/12 Potential to Achieve Goals: Good ADL Goals Pt Will Perform Grooming: with modified independence;Standing at sink ADL Goal: Grooming - Progress: Met Pt Will Transfer to Toilet: with modified independence;with DME;Ambulation;3-in-1 ADL Goal: Toilet Transfer - Progress: Met Additional ADL Goal #1: Pt.  will recall and use 3 energy conservation techniques with above ADL tasks ADL Goal: Additional Goal #1 - Progress: Met  Visit Information  Last OT Received On: 04/30/12 Assistance Needed: +1          Cognition  Overall Cognitive Status: Appears within functional limits for tasks assessed/performed Arousal/Alertness: Awake/alert Orientation Level: Appears intact for tasks assessed Behavior During Session: [facility] for tasks performed    Mobility Bed Mobility Bed Mobility: Supine to Sit Supine to Sit: 6: Modified independent (Device/Increase time);With rails Transfers Transfers: Not assessed Sit to Stand: 6: Modified independent (Device/Increase time) Stand to Sit: 6: Modified independent (Device/Increase time)         End of Session OT - End of Session Equipment Utilized During Treatment: Gait belt Activity Tolerance: Patient tolerated treatment well;Other (comment) Patient left: in chair;with call bell/phone within reach Nurse Communication: Mobility status   Cipriana Biller, OTR/L Pager [phone] 04/30/2012, 1:19 PM

## 2012-05-04 LAB — CULTURE, BLOOD (ROUTINE X 2): Culture  Setup Time: 201306060141

## 2012-05-17 ENCOUNTER — Ambulatory Visit (INDEPENDENT_AMBULATORY_CARE_PROVIDER_SITE_OTHER)
Admission: RE | Admit: 2012-05-17 | Discharge: 2012-05-17 | Disposition: A | Discharge status: Home / Self Care | Payer: Self-pay | Source: Ambulatory Visit | Attending: Internal Medicine | Admitting: Internal Medicine

## 2012-05-17 ENCOUNTER — Encounter: Payer: Self-pay | Admitting: Internal Medicine

## 2012-05-17 ENCOUNTER — Ambulatory Visit (INDEPENDENT_AMBULATORY_CARE_PROVIDER_SITE_OTHER): Payer: Self-pay | Admitting: Internal Medicine

## 2012-05-17 VITALS — BP 148/76 | HR 72 | Ht 65.0 in | Wt 288.4 lb

## 2012-05-17 DIAGNOSIS — R042 Hemoptysis: Secondary | ICD-10-CM

## 2012-05-17 DIAGNOSIS — F17200 Nicotine dependence, unspecified, uncomplicated: Secondary | ICD-10-CM

## 2012-05-17 DIAGNOSIS — J449 Chronic obstructive pulmonary disease, unspecified: Secondary | ICD-10-CM

## 2012-05-17 DIAGNOSIS — J189 Pneumonia, unspecified organism: Secondary | ICD-10-CM

## 2012-05-17 NOTE — Patient Instructions (Addendum)
Order- Schedule PFT and 6 MWT on room air       Dx COPD  Order- CXR   Dx COPD  Order- [facility]- referral to Pulmonary Rehab    Dx COPD   Sample-   Spiriva, 1 daily                  Advair 250 1 puff then rinse mouth, twice daily

## 2012-05-17 NOTE — Progress Notes (Signed)
05/17/12- 66 yoF former smoker seen for a post-hospital visit to establish pulmonary followup. PCP Dr Arrie Aran She was hospitalized twice this summer, most recently June 5 through June 8 at [facility], for healthcare associated pneumonia and COPD with exacerbation including some hemoptysis, congestive heart failure, hypertension, diabetes. She had had several episodes of bronchitis during the past winter. She had smoked for 40 years, up to one and a half packs per day, before tapering down and quitting one month ago. She was discharged on home oxygen 2 L/Advanced. She denies prior history of pneumonia. She was given pneumonia vaccine in hospital and has had flu vaccine. Since discharge she has finished a prednisone taper. She can't afford either Spiriva or Advair but says Dr. Jearl Klinefelter office is working for  [employer]. 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 In the past she hasn't recognized triggers to include lawnmowing, trucks with hay. His close sneezing, cough, watering eyes and chest tightness. She reports history of hypertension and congestive heart failure, angina, irregular heart rhythm but not myocardial infarction.  Prior to Admission medications   Medication Sig Start Date End Date Taking? Authorizing Provider  albuterol (PROVENTIL HFA;VENTOLIN HFA) 108 (90 BASE) MCG/ACT inhaler Inhale 2 puffs into the lungs every 4 (four) hours as needed for wheezing or shortness of breath. For shortness of breath. 04/30/12   Shanker Levora Dredge, MD  albuterol (PROVENTIL) (2.5 MG/3ML) 0.083% nebulizer solution Take 3 mLs (2.5 mg total) by nebulization every 6 (six) hours as needed for wheezing. 04/30/12 04/30/13  Shanker Levora Dredge, MD  aspirin 81 MG chewable tablet Chew 81 mg by mouth daily.    Historical Provider, MD  atenolol (TENORMIN) 50 MG tablet Take 50 mg by mouth daily.    Historical Provider, MD  cloNIDine  (CATAPRES) 0.1 MG tablet Take 0.1 mg by mouth daily.    Historical Provider, MD  fish oil-omega-3 fatty acids 1000 MG capsule Take 1 g by mouth daily.    Historical Provider, MD  Fluticasone-Salmeterol (ADVAIR) 250-50 MCG/DOSE AEPB Inhale 1 puff into the lungs 2 (two) times daily. 04/30/12   Shanker Levora Dredge, MD  furosemide (LASIX) 40 MG tablet Take 20-40 mg by mouth 2 (two) times daily. Take 1 tablet in the morning and take one-half of a tablet every night at bedtime.    Historical Provider, MD  metFORMIN (GLUCOPHAGE) 500 MG tablet Take 1 tablet (500 mg total) by mouth 2 (two) times daily with a meal. 04/30/12 04/30/13  Shanker Levora Dredge, MD  predniSONE (DELTASONE) 10 MG tablet Take 4 tablets for 4 days, then Take 3 tablets for 4 days, then Take 2 tablets for 4 days, then Take 1 tablet for 2 days and then discontinue 04/30/12   Maretta Bees, MD  tiotropium (SPIRIVA) 18 MCG inhalation capsule Place 1 capsule (18 mcg total) into inhaler and inhale daily. 04/30/12 04/30/13  Shanker Levora Dredge, MD   Family History  Problem Relation Age of Onset  . Allergies    . Hypertension    . Coronary artery disease Mother   . Stroke Father   . COPD Father    Past Medical History  Diagnosis Date  . Hypertension   . Multiple allergies   . Hyperlipidemia   . Peripheral vascular disease   . Heart murmur   . Angina   . COPD (chronic obstructive pulmonary disease) 04/20/12    "  just dx'd with this today"  . Asthma   . Bronchitis 04/20/12    "just got over 3rd bout this winter ~ 6-8 wk ago"  . Type II diabetes mellitus     "borderline"  . Anemia   . H/O hiatal hernia   . Arthritis     "my knees"  . Pneumonia 04/2012    "first time ever"  . Shortness of breath 04/27/12    "all the time"  . Migraines     "last one was maybe 2010 or 2011"  . Panic attacks 04/20/12    "had breathing problem; turned into full fledged panic attack"  . CHF (congestive heart failure)    Past Surgical History  Procedure Date  .  Laceration repair 1966    "right upper arm"  . Dilation and curettage of uterus 1990's  . Finger reattachment 1973    "right ring finger"   History   Social History  . Marital Status: Married    Spouse Name: N/A    Number of Children: N/A  . Years of Education: N/A   Occupational History  . Not on file.   Social History Main Topics  . Smoking status: Former Smoker -- 0.2 packs/day for 40 years    Types: Cigarettes    Quit date: 04/20/2012  . Smokeless tobacco: Never Used   Comment: 6/5//13 "a pack of cigarettes would last me 1 - 1 1/2 wk"  . Alcohol Use: No  . Drug Use: No  . Sexually Active: Not Currently   Other Topics Concern  . Not on file   Social History Narrative  . No narrative on file   ROS-see HPI Constitutional:   No-   weight loss, night sweats, fevers, chills, fatigue, lassitude. HEENT:   No-  headaches, difficulty swallowing, tooth/dental problems, sore throat,       No-  sneezing, itching, ear ache, nasal congestion, post nasal drip,  CV:  No-   chest pain, orthopnea, PND, +swelling in lower extremities, No-anasarca,  dizziness, palpitations Resp: +  shortness of breath with exertion or at rest.              No-   productive cough,  N+ non-productive cough,  No- coughing up of blood.              No-   change in color of mucus.  No- wheezing.   Skin: No-   rash or lesions. GI:  No-   heartburn, indigestion, abdominal pain, nausea, vomiting, diarrhea,                 change in bowel habits, loss of appetite GU: No-   dysuria, change in color of urine, no urgency or frequency.  No- flank pain. MS:  No-   joint pain or swelling.  No- decreased range of motion.  No- back pain. Neuro-     nothing unusual Psych:  No- change in mood or affect. No depression or anxiety.  No memory loss.  OBJ- Physical Exam BP 148/76  Pulse 72  Ht 5\' 5"  (1.651 m)  Wt 288 lb 6.4 oz (130.817 kg)  BMI 47.99 kg/m2  SpO2 98%  General- Alert, Oriented, Affect-appropriate,  Distress- none acute, obese, 2 L O2 Skin- rash-none, lesions- none, excoriation- none Lymphadenopathy- none Head- atraumatic            Eyes- Gross vision intact, PERRLA, conjunctivae and secretions clear  Ears- Hearing, canals-normal            Nose- Clear, no-Septal dev, mucus, polyps, erosion, perforation             Throat- Mallampati II , mucosa clear , drainage- none, tonsils- atrophic Neck- flexible , trachea midline, no stridor , thyroid nl, carotid no bruit Chest - symmetrical excursion , unlabored           Heart/CV- RRR , no murmur , no gallop  , no rub, nl s1 s2                           - JVD- none , edema 1-2+, +stasis changes, varices- none           Lung- distant but clear to P&A, wheeze- none, cough- none , dullness-none, rub- none           Chest wall-  Abd-  Br/ Gen/ Rectal- Not done, not indicated Extrem- cyanosis- none, clubbing, none, atrophy- none, strength- nl. Negative Homan's Neuro- grossly intact to observation

## 2012-05-19 NOTE — Progress Notes (Signed)
Quick Note:  LMTCB ______ 

## 2012-05-22 ENCOUNTER — Encounter: Payer: Self-pay | Admitting: Internal Medicine

## 2012-05-22 NOTE — Assessment & Plan Note (Signed)
No recurrence since treatment for pneumonia June, 2013. Plan-chest x-ray

## 2012-05-22 NOTE — Assessment & Plan Note (Signed)
Left lower lobe, hospitalized June 5-8, 2013. Clinically this has resolved. Plan-chest x-ray

## 2012-05-22 NOTE — Assessment & Plan Note (Addendum)
She feels significantly improved since her hospital stay and controlled now using her nebulizer machine and rescue inhaler 2-3 times daily. She feels she cannot afford Advair or Spiriva without assistance which is being applied for through her primary office. Plan-samples of Spiriva and Advair 250. Chest x-ray, pulmonary rehabilitation referral, schedule PFT.

## 2012-05-23 NOTE — Progress Notes (Signed)
Quick Note:  Pt aware of results. ______ 

## 2012-06-15 ENCOUNTER — Emergency Department (HOSPITAL_COMMUNITY): Payer: Self-pay

## 2012-06-15 ENCOUNTER — Inpatient Hospital Stay (HOSPITAL_COMMUNITY)
Admission: EM | Admit: 2012-06-15 | Discharge: 2012-06-20 | DRG: 292 | Disposition: A | Discharge status: Home / Self Care | Payer: MEDICAID | Attending: Interventional Cardiology | Admitting: Interventional Cardiology

## 2012-06-15 ENCOUNTER — Encounter (HOSPITAL_COMMUNITY): Payer: Self-pay

## 2012-06-15 DIAGNOSIS — E785 Hyperlipidemia, unspecified: Secondary | ICD-10-CM | POA: Diagnosis present

## 2012-06-15 DIAGNOSIS — E119 Type 2 diabetes mellitus without complications: Secondary | ICD-10-CM | POA: Diagnosis present

## 2012-06-15 DIAGNOSIS — Z87891 Personal history of nicotine dependence: Secondary | ICD-10-CM

## 2012-06-15 DIAGNOSIS — R0902 Hypoxemia: Secondary | ICD-10-CM

## 2012-06-15 DIAGNOSIS — F17200 Nicotine dependence, unspecified, uncomplicated: Secondary | ICD-10-CM

## 2012-06-15 DIAGNOSIS — M1710 Unilateral primary osteoarthritis, unspecified knee: Secondary | ICD-10-CM | POA: Diagnosis present

## 2012-06-15 DIAGNOSIS — E669 Obesity, unspecified: Secondary | ICD-10-CM

## 2012-06-15 DIAGNOSIS — I739 Peripheral vascular disease, unspecified: Secondary | ICD-10-CM | POA: Diagnosis present

## 2012-06-15 DIAGNOSIS — I509 Heart failure, unspecified: Secondary | ICD-10-CM | POA: Diagnosis present

## 2012-06-15 DIAGNOSIS — J449 Chronic obstructive pulmonary disease, unspecified: Secondary | ICD-10-CM | POA: Diagnosis present

## 2012-06-15 DIAGNOSIS — J40 Bronchitis, not specified as acute or chronic: Secondary | ICD-10-CM | POA: Diagnosis present

## 2012-06-15 DIAGNOSIS — Z79899 Other long term (current) drug therapy: Secondary | ICD-10-CM

## 2012-06-15 DIAGNOSIS — I200 Unstable angina: Secondary | ICD-10-CM

## 2012-06-15 DIAGNOSIS — I10 Essential (primary) hypertension: Secondary | ICD-10-CM | POA: Diagnosis present

## 2012-06-15 DIAGNOSIS — R0602 Shortness of breath: Secondary | ICD-10-CM

## 2012-06-15 DIAGNOSIS — J4489 Other specified chronic obstructive pulmonary disease: Secondary | ICD-10-CM | POA: Diagnosis present

## 2012-06-15 DIAGNOSIS — Z7982 Long term (current) use of aspirin: Secondary | ICD-10-CM

## 2012-06-15 DIAGNOSIS — G4733 Obstructive sleep apnea (adult) (pediatric): Secondary | ICD-10-CM | POA: Diagnosis present

## 2012-06-15 DIAGNOSIS — Z6841 Body Mass Index (BMI) 40.0 and over, adult: Secondary | ICD-10-CM

## 2012-06-15 DIAGNOSIS — I5033 Acute on chronic diastolic (congestive) heart failure: Principal | ICD-10-CM | POA: Diagnosis present

## 2012-06-15 DIAGNOSIS — Z9981 Dependence on supplemental oxygen: Secondary | ICD-10-CM

## 2012-06-15 HISTORY — DX: Unspecified osteoarthritis, unspecified site: M19.90

## 2012-06-15 HISTORY — DX: Morbid (severe) obesity due to excess calories: E66.01

## 2012-06-15 LAB — GLUCOSE, CAPILLARY: Glucose-Capillary: 363 mg/dL — ABNORMAL HIGH (ref 70–99)

## 2012-06-15 LAB — PROTIME-INR: Prothrombin Time: 12.4 seconds (ref 11.6–15.2)

## 2012-06-15 LAB — BASIC METABOLIC PANEL
Calcium: 9.9 mg/dL (ref 8.4–10.5)
GFR calc Af Amer: >90 mL/min (ref >90)
GFR calc non Af Amer: >90 mL/min (ref >90)
Potassium: 3.9 mEq/L (ref 3.5–5.1)
Sodium: 139 mEq/L (ref 135–145)

## 2012-06-15 LAB — CBC
MCH: 30.4 pg (ref 26.0–34.0)
MCHC: 32.2 g/dL (ref 30.0–36.0)
Platelets: 239 10*3/uL (ref 150–400)

## 2012-06-15 LAB — TROPONIN I
Troponin I: 0.87 ng/mL (ref <0.30)
Troponin I: 0.67 ng/mL (ref <0.30)

## 2012-06-15 LAB — PRO B NATRIURETIC PEPTIDE: Pro B Natriuretic peptide (BNP): 280 pg/mL — ABNORMAL HIGH (ref 0–125)

## 2012-06-15 LAB — CK TOTAL AND CKMB (NOT AT ARMC): Total CK: 140 U/L (ref 7–177)

## 2012-06-15 MED ORDER — ALBUTEROL SULFATE (5 MG/ML) 0.5% IN NEBU
2.5000 mg | INHALATION_SOLUTION | Freq: Four times a day (QID) | RESPIRATORY_TRACT | Status: DC
  Administered 2012-06-16 – 2012-06-20 (×17): 2.5 mg via RESPIRATORY_TRACT
  Filled 2012-06-15 (×17): qty 0.5

## 2012-06-15 MED ORDER — FUROSEMIDE 10 MG/ML IJ SOLN
40.0000 mg | Freq: Two times a day (BID) | INTRAMUSCULAR | Status: DC
  Administered 2012-06-16 (×2): 40 mg via INTRAVENOUS
  Filled 2012-06-15 (×5): qty 4

## 2012-06-15 MED ORDER — HEPARIN BOLUS VIA INFUSION
4000.0000 [IU] | Freq: Once | INTRAVENOUS | Status: AC
  Administered 2012-06-15: 4000 [IU] via INTRAVENOUS

## 2012-06-15 MED ORDER — HEPARIN (PORCINE) IN NACL 100-0.45 UNIT/ML-% IJ SOLN
1850.0000 [IU]/h | INTRAMUSCULAR | Status: DC
  Administered 2012-06-15: 1000 [IU]/h via INTRAVENOUS
  Administered 2012-06-16: 1850 [IU]/h via INTRAVENOUS
  Administered 2012-06-16: 1500 [IU]/h via INTRAVENOUS
  Filled 2012-06-15 (×2): qty 250

## 2012-06-15 MED ORDER — IPRATROPIUM BROMIDE 0.02 % IN SOLN
0.5000 mg | Freq: Once | RESPIRATORY_TRACT | Status: AC
  Administered 2012-06-15: 0.5 mg via RESPIRATORY_TRACT
  Filled 2012-06-15: qty 2.5

## 2012-06-15 MED ORDER — NITROGLYCERIN IN D5W 200-5 MCG/ML-% IV SOLN
2.0000 ug/min | Freq: Once | INTRAVENOUS | Status: AC
  Administered 2012-06-15: 5 ug/min via INTRAVENOUS
  Filled 2012-06-15: qty 250

## 2012-06-15 MED ORDER — IPRATROPIUM BROMIDE 0.02 % IN SOLN
0.5000 mg | Freq: Four times a day (QID) | RESPIRATORY_TRACT | Status: DC
  Administered 2012-06-16 – 2012-06-20 (×17): 0.5 mg via RESPIRATORY_TRACT
  Filled 2012-06-15 (×17): qty 2.5

## 2012-06-15 MED ORDER — ACETAMINOPHEN 325 MG PO TABS: 650.0000 mg | ORAL_TABLET | ORAL | Status: DC | PRN

## 2012-06-15 MED ORDER — METHYLPREDNISOLONE SODIUM SUCC 125 MG IJ SOLR
125.0000 mg | Freq: Once | INTRAMUSCULAR | Status: AC
  Administered 2012-06-15: 125 mg via INTRAVENOUS
  Filled 2012-06-15: qty 2

## 2012-06-15 MED ORDER — NITROGLYCERIN 0.4 MG SL SUBL: 0.4000 mg | SUBLINGUAL_TABLET | SUBLINGUAL | Status: DC | PRN

## 2012-06-15 MED ORDER — ONDANSETRON HCL 4 MG/2ML IJ SOLN: 4.0000 mg | Freq: Four times a day (QID) | INTRAMUSCULAR | Status: DC | PRN

## 2012-06-15 MED ORDER — ALBUTEROL SULFATE (5 MG/ML) 0.5% IN NEBU
5.0000 mg | INHALATION_SOLUTION | Freq: Once | RESPIRATORY_TRACT | Status: AC
  Administered 2012-06-15: 5 mg via RESPIRATORY_TRACT
  Filled 2012-06-15: qty 40

## 2012-06-15 MED ORDER — ASPIRIN EC 325 MG PO TBEC
325.0000 mg | DELAYED_RELEASE_TABLET | Freq: Every day | ORAL | Status: DC
  Administered 2012-06-16 – 2012-06-20 (×5): 325 mg via ORAL
  Filled 2012-06-15 (×5): qty 1

## 2012-06-15 MED ORDER — ASPIRIN 81 MG PO CHEW
324.0000 mg | CHEWABLE_TABLET | ORAL | Status: AC
  Administered 2012-06-16: 324 mg via ORAL
  Filled 2012-06-15: qty 4

## 2012-06-15 MED ORDER — ASPIRIN 300 MG RE SUPP
300.0000 mg | RECTAL | Status: AC
  Filled 2012-06-15: qty 1

## 2012-06-15 MED ORDER — FUROSEMIDE 10 MG/ML IJ SOLN
INTRAMUSCULAR | Status: AC
  Filled 2012-06-15: qty 4

## 2012-06-15 MED ORDER — FUROSEMIDE 10 MG/ML IJ SOLN
40.0000 mg | Freq: Once | INTRAMUSCULAR | Status: AC
  Administered 2012-06-15: 40 mg via INTRAVENOUS

## 2012-06-15 MED ORDER — ATENOLOL 50 MG PO TABS
50.0000 mg | ORAL_TABLET | Freq: Every day | ORAL | Status: DC
  Administered 2012-06-16 – 2012-06-17 (×2): 50 mg via ORAL
  Filled 2012-06-15 (×2): qty 1

## 2012-06-15 MED ORDER — INSULIN ASPART 100 UNIT/ML ~~LOC~~ SOLN
0.0000 [IU] | Freq: Three times a day (TID) | SUBCUTANEOUS | Status: DC
  Administered 2012-06-16: 8 [IU] via SUBCUTANEOUS
  Administered 2012-06-16: 3 [IU] via SUBCUTANEOUS
  Administered 2012-06-17 (×2): 2 [IU] via SUBCUTANEOUS
  Administered 2012-06-18: 5 [IU] via SUBCUTANEOUS
  Administered 2012-06-18: 09:00:00 via SUBCUTANEOUS
  Administered 2012-06-19 – 2012-06-20 (×3): 2 [IU] via SUBCUTANEOUS

## 2012-06-15 MED ORDER — ASPIRIN 325 MG PO TABS
325.0000 mg | ORAL_TABLET | Freq: Once | ORAL | Status: AC
  Administered 2012-06-15: 325 mg via ORAL
  Filled 2012-06-15: qty 1

## 2012-06-15 MED ORDER — OMEGA-3-ACID ETHYL ESTERS 1 G PO CAPS
1.0000 g | ORAL_CAPSULE | Freq: Every day | ORAL | Status: DC
  Administered 2012-06-16 – 2012-06-20 (×5): 1 g via ORAL
  Filled 2012-06-15 (×5): qty 1

## 2012-06-15 NOTE — H&P (Signed)
Cardiology H&P  Primary Care Povider: Quentin Mulling, MD Primary Cardiologist: none   HPI: Margaret [name] is a 56 y.o.female with recently diagnosed COPD who presents to the ED with SOB.  She was recently in the hospital and treated for pneumonia in June and recovered without any problem.  She was just seen by Pulmonary and was set up to undergo PFT's.   Today, while at work, she ran out of her oxygen and became severely short of breath while trying to get her other bottle.  She was out for about 15 minutes.  She felt extreme air hunger and became panicked.  EMS was called and she responded well to oxygen and breathing treatments.  She denies having had chest pain.  She does report that she had noticed some increasing SOB over the last few days prior to this as well as increased orthopnea and PND.  She also has unchanged lower ext edema and increased fullness in her abdomen.  She has never been diagnosed with OSA.  She is not currently on prednisone and has not had any fevers, chills, or productive cough.  She had a recent echocardiogram showing normal LV and RV systolic function, mild MR. She did have mild LVH   Past Medical History  Diagnosis Date  . Hypertension   . Multiple allergies   . Hyperlipidemia   . Peripheral vascular disease   . Heart murmur   . Angina   . COPD (chronic obstructive pulmonary disease) 04/20/12  . Asthma   . Bronchitis 04/20/12    "just got over 3rd bout this winter ~ 6-8 wk ago"  . Type II diabetes mellitus     "borderline"  . Anemia   . H/O hiatal hernia   . Arthritis     "my knees"  . Pneumonia 04/2012    "first time ever"  . Shortness of breath 04/27/12    "all the time"  . Migraines     "last one was maybe 2010 or 2011"  . Panic attacks 04/20/12    "had breathing problem; turned into full fledged panic attack"  . CHF (congestive heart failure)     Past Surgical History  Procedure Date  . Laceration repair 1966    "right upper arm"  . Dilation  and curettage of uterus 1990's  . Finger reattachment 1973    "right ring finger"    Family History  Problem Relation Age of Onset  . Allergies    . Hypertension    . Coronary artery disease Mother 62  . Stroke Father   . COPD Father     Social History:  reports that she quit smoking about 8 weeks ago. Her smoking use included Cigarettes. She has a 8 pack-year smoking history. She has never used smokeless tobacco. She reports that she does not drink alcohol or use illicit drugs.  Allergies:  Allergies  Allergen Reactions  . Codeine Hives, Itching and Other (See Comments)    "breathing problems"  . Latex Itching    Current Facility-Administered Medications  Medication Dose Route Frequency Provider Last Rate Last Dose  . acetaminophen (TYLENOL) tablet 650 mg  650 mg Oral Q4H PRN Meridee Score, MD      . ipratropium (ATROVENT) nebulizer solution 0.5 mg  0.5 mg Nebulization QID Meridee Score, MD       And  . albuterol (PROVENTIL) (5 MG/ML) 0.5% nebulizer solution 2.5 mg  2.5 mg Nebulization QID Meridee Score, MD      . albuterol (  PROVENTIL) (5 MG/ML) 0.5% nebulizer solution 5 mg  5 mg Nebulization Once Lyanne Co, MD   5 mg at 06/15/12 1734  . aspirin chewable tablet 324 mg  324 mg Oral NOW Meridee Score, MD       Or  . aspirin suppository 300 mg  300 mg Rectal NOW Meridee Score, MD      . aspirin EC tablet 325 mg  325 mg Oral Daily Meridee Score, MD      . aspirin tablet 325 mg  325 mg Oral Once Lyanne Co, MD   325 mg at 06/15/12 1902  . atenolol (TENORMIN) tablet 50 mg  50 mg Oral Daily Meridee Score, MD      . furosemide (LASIX) injection 40 mg  40 mg Intravenous Once Meridee Score, MD   40 mg at 06/15/12 2224  . furosemide (LASIX) injection 40 mg  40 mg Intravenous BID Meridee Score, MD      . heparin ADULT infusion 100 units/mL (25000 units/250 mL)  1,000 Units/hr Intravenous Continuous Lyanne Co, MD 10 mL/hr at 06/15/12 2036 1,000 Units/hr at 06/15/12  2036  . heparin bolus via infusion 4,000 Units  4,000 Units Intravenous Once Lyanne Co, MD   4,000 Units at 06/15/12 2036  . ipratropium (ATROVENT) nebulizer solution 0.5 mg  0.5 mg Nebulization Once Lyanne Co, MD   0.5 mg at 06/15/12 1734  . methylPREDNISolone sodium succinate (SOLU-MEDROL) 125 mg/2 mL injection 125 mg  125 mg Intravenous Once Lyanne Co, MD   125 mg at 06/15/12 1700  . nitroGLYCERIN (NITROSTAT) SL tablet 0.4 mg  0.4 mg Sublingual Q5 Min x 3 PRN Meridee Score, MD      . nitroGLYCERIN 0.2 mg/mL in dextrose 5 % infusion  2-200 mcg/min Intravenous Once Lyanne Co, MD   5 mcg/min at 06/15/12 1935  . omega-3 acid ethyl esters (LOVAZA) capsule 1 g  1 g Oral Daily Meridee Score, MD      . ondansetron [facility]) injection 4 mg  4 mg Intravenous Q6H PRN Meridee Score, MD        ROS: A full review of systems is obtained and is negative except as noted in the HPI.  Physical Exam: Blood pressure 157/71, pulse 86, temperature 98.8 F (37.1 C), temperature source Oral, resp. rate 17, height 5\' 5"  (1.651 m), weight 136.397 kg (300 lb 11.2 oz), SpO2 96.00%.  GENERAL: mild resp distress, morbidly obese EYES: Extra ocular movements are intact. There is no lid lag. Sclera is anicteric.  ENT: Oropharynx is clear. Dentition is within normal limits.  NECK: Supple. The thyroid is not enlarged.  LYMPH: There are no masses or lymphadenopathy present.  HEART: Regular rate and rhythm with early systolic flow murmur at the RUSB, JVP elevated LUNGS: Clear to auscultation. Mild exp wheezing ABDOMEN: Soft, non-tender, and non-distended . obese  EXTREMITIES: brawny edema bilatearlly PULSES: Carotids were +2 and equal bilaterally with no bruits.  DP/PT pulses were +2 and equal bilaterally.  SKIN: Warm, dry, and intact.  NEUROLOGIC: The patient was oriented to person, place, and time. No overt neurologic deficits were detected.  PSYCH: Normal judgment and insight, mood is appropriate.     Results: Results for orders placed during the hospital encounter of 06/15/12 (from the past 24 hour(s))  CBC     Status: Abnormal   Collection Time   06/15/12  3:52 PM      Component Value Range   WBC 9.8  4.0 - 10.5 K/uL   RBC 4.74  3.87 - 5.11 MIL/uL   Hemoglobin 14.4  12.0 - 15.0 g/dL   HCT 16.1  09.6 - 04.5 %   MCV 94.3  78.0 - 100.0 fL   MCH 30.4  26.0 - 34.0 pg   MCHC 32.2  30.0 - 36.0 g/dL   RDW 40.9 (*) 81.1 - 91.4 %   Platelets 239  150 - 400 K/uL  BASIC METABOLIC PANEL     Status: Abnormal   Collection Time   06/15/12  3:52 PM      Component Value Range   Sodium 139  135 - 145 mEq/L   Potassium 3.9  3.5 - 5.1 mEq/L   Chloride 101  96 - 112 mEq/L   CO2 28  19 - 32 mEq/L   Glucose, Bld 216 (*) 70 - 99 mg/dL   BUN 10  6 - 23 mg/dL   Creatinine, Ser 7.82  0.50 - 1.10 mg/dL   Calcium 9.9  8.4 - 95.6 mg/dL   GFR calc non Af Amer >90  >90 mL/min   GFR calc Af Amer >90  >90 mL/min  PRO B NATRIURETIC PEPTIDE     Status: Abnormal   Collection Time   06/15/12  6:10 PM      Component Value Range   Pro B Natriuretic peptide (BNP) 280.0 (*) 0 - 125 pg/mL  TROPONIN I     Status: Abnormal   Collection Time   06/15/12  6:10 PM      Component Value Range   Troponin I 0.87 (*) <0.30 ng/mL  CK TOTAL AND CKMB     Status: Abnormal   Collection Time   06/15/12  7:55 PM      Component Value Range   Total CK 140  7 - 177 U/L   CK, MB 5.2 (*) 0.3 - 4.0 ng/mL   Relative Index 3.7 (*) 0.0 - 2.5  TROPONIN I     Status: Abnormal   Collection Time   06/15/12  8:27 PM      Component Value Range   Troponin I 0.67 (*) <0.30 ng/mL    EKG: NSR without STT changes CXR: mild pulm edema  Assessment/Plan: 56 yo WF with morbid obesity and COPD here with episode of severe respiratory distress (hypoxia) and mildly elevated cardiac markers.   1. SOB: I suspect the etiology is multifactorial due to lung disease and probably obesity hypoventilation syndrome.  She also appears volume expanded  which is likely contributing.  Her LV systolic function is normal by echo last month.  She is not currently wheezing - needs sleep study ASAP - A/A nebs - diurese with lasix 40mg  IV BID 2. Elevated biomarkers: I suspect this is secondary to her episode of respiratory distress she had earlier rather than an acute coronary event.  She has not had symptoms concerning for ACS; however, she does have risk factors for CAD (HTN, FH, obesity) and will need to be evaluated at some point for obstructive dz. - follow cardiac markers - heparin ggt and ASA 325 daily for now until markers are back - if any significant elevation, will need to r/o CAD during this admission 3. DM:  - hold metformin and start SSI - check Ha1c 4. HTN:  - continue atenolol  Marvelle Caudill 06/15/2012, 11:29 PM

## 2012-06-15 NOTE — ED Notes (Signed)
RT at bedside.

## 2012-06-15 NOTE — ED Notes (Signed)
Lab and xray at bedside.

## 2012-06-15 NOTE — ED Notes (Signed)
MD at bedside. 

## 2012-06-15 NOTE — ED Provider Notes (Addendum)
History     CSN: [id number]  Arrival date & time 06/15/12  1534   First MD Initiated Contact with Patient 06/15/12 1537      Chief Complaint  Patient presents with  . Asthma     HPI Patient reports 3 days of worsening intermittent shortness of breath and exertional shortness of breath.  She's had new orthopnea without PND.  She reports persistent lower extremity edema at baseline.  She has no known history of coronary artery disease.  She has a mother who had her first MI at age 69.  She has diabetes, hypertension, prior significant smoking history.  The patient has never had heart catheterization.  She reports some improvement in her breathing with her albuterol however today she developed severe shortness of breath with tightness in her chest.  There is no radiation of the tightness.  EMS gave her treatments in route with some improvement in her symptoms.  She had pneumonia about 6 weeks ago and finished a complete course of antibiotics.   Past Medical History  Diagnosis Date  . Hypertension   . Multiple allergies   . Hyperlipidemia   . Peripheral vascular disease   . Heart murmur   . Angina   . COPD (chronic obstructive pulmonary disease) 04/20/12    "just dx'd with this today"  . Asthma   . Bronchitis 04/20/12    "just got over 3rd bout this winter ~ 6-8 wk ago"  . Type II diabetes mellitus     "borderline"  . Anemia   . H/O hiatal hernia   . Arthritis     "my knees"  . Pneumonia 04/2012    "first time ever"  . Shortness of breath 04/27/12    "all the time"  . Migraines     "last one was maybe 2010 or 2011"  . Panic attacks 04/20/12    "had breathing problem; turned into full fledged panic attack"  . CHF (congestive heart failure)     Past Surgical History  Procedure Date  . Laceration repair 1966    "right upper arm"  . Dilation and curettage of uterus 1990's  . Finger reattachment 1973    "right ring finger"    Family History  Problem Relation Age of Onset    . Allergies    . Hypertension    . Coronary artery disease Mother   . Stroke Father   . COPD Father     History  Substance Use Topics  . Smoking status: Former Smoker -- 0.2 packs/day for 40 years    Types: Cigarettes    Quit date: 04/20/2012  . Smokeless tobacco: Never Used   Comment: 6/5//13 "a pack of cigarettes would last me 1 - 1 1/2 wk"  . Alcohol Use: No    OB History    Grav Para Term Preterm Abortions TAB SAB Ect Mult Living                  Review of Systems  All other systems reviewed and are negative.    Allergies  Codeine and Latex  Home Medications   Current Outpatient Rx  Name Route Sig Dispense Refill  . ALBUTEROL SULFATE HFA 108 (90 BASE) MCG/ACT IN AERS Inhalation Inhale 2 puffs into the lungs every 4 (four) hours as needed. For shortness of breath.    . ALBUTEROL SULFATE (2.5 MG/3ML) 0.083% IN NEBU Nebulization Take 2.5 mg by nebulization every 6 (six) hours as needed. For shortness of breath    .  ASPIRIN 81 MG PO CHEW Oral Chew 81 mg by mouth daily.    . ATENOLOL 50 MG PO TABS Oral Take 50 mg by mouth daily.    . OMEGA-3 FATTY ACIDS 1000 MG PO CAPS Oral Take 1 g by mouth daily.    . FUROSEMIDE 40 MG PO TABS Oral Take 20-40 mg by mouth 2 (two) times daily. Take 1 tablet in the morning and take one-half of a tablet every night at bedtime.    Marland Kitchen METFORMIN HCL 500 MG PO TABS Oral Take 500 mg by mouth 2 (two) times daily with a meal.      BP 151/82  Pulse 90  Temp 98.2 F (36.8 C) (Oral)  Resp 21  Ht 5' 4.96" (1.65 m)  Wt 288 lb 5.8 oz (130.8 kg)  BMI 48.04 kg/m2  SpO2 98%  Physical Exam  Nursing note and vitals reviewed. Constitutional: She is oriented to person, place, and time. She appears well-developed and well-nourished. No distress.  HENT:  Head: Normocephalic and atraumatic.  Eyes: EOM are normal.  Neck: Normal range of motion.  Cardiovascular: Normal rate, regular rhythm and normal heart sounds.   Pulmonary/Chest: Effort normal  and breath sounds normal.  Abdominal: Soft. She exhibits no distension. There is no tenderness.  Musculoskeletal: Normal range of motion.  Neurological: She is alert and oriented to person, place, and time.  Skin: Skin is warm and dry.  Psychiatric: She has a normal mood and affect. Judgment normal.    ED Course  Procedures (including critical care time)  CRITICAL CARE Performed by: Lyanne Co Total critical care time: 30 Critical care time was exclusive of separately billable procedures and treating other patients. Critical care was necessary to treat or prevent imminent or life-threatening deterioration. Critical care was time spent personally by me on the following activities: development of treatment plan with patient and/or surrogate as well as nursing, discussions with consultants, evaluation of patient's response to treatment, examination of patient, obtaining history from patient or surrogate, ordering and performing treatments and interventions, ordering and review of laboratory studies, ordering and review of radiographic studies, pulse oximetry and re-evaluation of patient's condition.   Labs Reviewed  CBC - Abnormal; Notable for the following:    RDW 16.0 (*)     All other components within normal limits  BASIC METABOLIC PANEL - Abnormal; Notable for the following:    Glucose, Bld 216 (*)     All other components within normal limits  PRO B NATRIURETIC PEPTIDE - Abnormal; Notable for the following:    Pro B Natriuretic peptide (BNP) 280.0 (*)     All other components within normal limits  TROPONIN I - Abnormal; Notable for the following:    Troponin I 0.87 (*)     All other components within normal limits  CK TOTAL AND CKMB - Abnormal; Notable for the following:    CK, MB 5.2 (*)     Relative Index 3.7 (*)     All other components within normal limits  HEPARIN LEVEL (UNFRACTIONATED)  TROPONIN I   Dg Chest Portable 1 View  06/15/2012  *RADIOLOGY REPORT*   Clinical Data: Shortness of breath, history asthma, COPD, smoking  PORTABLE CHEST - 1 VIEW  Comparison: Portable exam 1559 hours compared to 05/17/2012  Findings: Enlargement of cardiac silhouette with pulmonary vascular congestion. Questionable acute versus chronic interstitial infiltrates in the perihilar regions, greater on the right, potentially representing pulmonary edema. Suboptimal visualization of left base due to underpenetration. No  definite pleural effusion or pneumothorax on limited assessment. Endplate spur formation thoracic spine.  IMPRESSION: Enlargement of cardiac silhouette with pulmonary vascular congestion. Questionable perihilar infiltrate/edema.  Original Report Authenticated By: Lollie Marrow, M.D.    I personally reviewed the imaging tests through PACS system  I reviewed available ER/hospitalization records thought the EMR   Date: 06/16/2012  Rate: 80  Rhythm: normal sinus rhythm  QRS Axis: normal  Intervals: normal  ST/T Wave abnormalities: normal  Conduction Disutrbances: none  Narrative Interpretation:   Old EKG Reviewed: No significant changes noted     1. Unstable angina       MDM  The patient has symptoms that sound suggestive of COPD exacerbation however she has new edema on her chest x-ray.  At this time a BNP and troponin were added.  Her troponin is 0.87.  Her EKG demonstrates normal sinus rhythm without changes in her ST segments.  Repeat evaluation and discussion with the patient demonstrate significant risk factors for coronary artery disease including a mom who had her first heart attack at age 22.  Similar symptoms may represent unstable angina given her elevation in her troponin should likely benefit from heart catheterization.  I discussed the case with the on-call cardiologist who will admitthe patient the hospital  [facility] Cards- Dr Theodoro Doing, MD 06/16/12 1610  Lyanne Co, MD 07/04/12 [phone]

## 2012-06-15 NOTE — ED Notes (Signed)
RT called for breathing trt

## 2012-06-15 NOTE — Progress Notes (Signed)
ANTICOAGULATION CONSULT NOTE - Initial Consult  Pharmacy Consult for UFH Indication: r/o NSTEMI  Allergies  Allergen Reactions  . Codeine Hives, Itching and Other (See Comments)    "breathing problems"  . Latex Itching    Patient Measurements: Height: 5' 4.96" (165 cm) Weight: 288 lb 5.8 oz (130.8 kg) IBW/kg (Calculated) : 56.91  Heparin Dosing Weight: 88kg  Vital Signs: Temp: 98.2 F (36.8 C) (07/24 1553) Temp src: Oral (07/24 1553) BP: 115/68 mmHg (07/24 1845) Pulse Rate: 84  (07/24 1845)  Labs:  Basename 06/15/12 1810 06/15/12 1552  HGB -- 14.4  HCT -- 44.7  PLT -- 239  APTT -- --  LABPROT -- --  INR -- --  HEPARINUNFRC -- --  CREATININE -- 0.65  CKTOTAL -- --  CKMB -- --  TROPONINI 0.87* --    Estimated Creatinine Clearance: 107.2 ml/min (by C-G formula based on Cr of 0.65).   Medical History: Past Medical History  Diagnosis Date  . Hypertension   . Multiple allergies   . Hyperlipidemia   . Peripheral vascular disease   . Heart murmur   . Angina   . COPD (chronic obstructive pulmonary disease) 04/20/12    "just dx'd with this today"  . Asthma   . Bronchitis 04/20/12    "just got over 3rd bout this winter ~ 6-8 wk ago"  . Type II diabetes mellitus     "borderline"  . Anemia   . H/O hiatal hernia   . Arthritis     "my knees"  . Pneumonia 04/2012    "first time ever"  . Shortness of breath 04/27/12    "all the time"  . Migraines     "last one was maybe 2010 or 2011"  . Panic attacks 04/20/12    "had breathing problem; turned into full fledged panic attack"  . CHF (congestive heart failure)     Medications:   (Not in a hospital admission)  Assessment: 56 y/o female patient admitted with shortness of breath, found to have positive cardiac enzymes requiring anticoagulation for r/o NSTEMI.  Goal of Therapy:  Heparin level 0.3-0.7 units/ml Monitor platelets by anticoagulation protocol: Yes   Plan:  Heparin 4000 unit IV bolus followed by  infusion at 1000 unit/hr. Check 6 hour heparin level with daily cbc and heparin level.  Verlene Mayer, PharmD, BCPS Pager [phone] 06/15/2012,7:30 PM

## 2012-06-15 NOTE — ED Notes (Signed)
MD at bedside.  Notified MD Caprossi of positive Troponin.

## 2012-06-15 NOTE — ED Notes (Signed)
Assisted pt to and from RR.  Pt very SOB with ambulation.  RT at bedside for breathing trt.

## 2012-06-15 NOTE — ED Notes (Signed)
Pt having asthma attack, was at work, started having wheezing, could not get neb hooked up.  EMS called.  COPD Hx.  EMS gave neb trt (albuterol 5mg ).  Pt feeling anxious, walking making wheezing worse.  Pt had recent dx of PNA, just finished ABX 6 weeks ago.  Pt states feeling same as past PNA

## 2012-06-15 NOTE — ED Notes (Signed)
Lab at bedside

## 2012-06-16 ENCOUNTER — Encounter (HOSPITAL_COMMUNITY): Payer: Self-pay | Admitting: *Deleted

## 2012-06-16 LAB — BASIC METABOLIC PANEL
CO2: 27 mEq/L (ref 19–32)
Calcium: 10.2 mg/dL (ref 8.4–10.5)
Chloride: 99 mEq/L (ref 96–112)
GFR calc Af Amer: >90 mL/min (ref >90)
Sodium: 139 mEq/L (ref 135–145)

## 2012-06-16 LAB — GLUCOSE, CAPILLARY
Glucose-Capillary: 274 mg/dL — ABNORMAL HIGH (ref 70–99)
Glucose-Capillary: 263 mg/dL — ABNORMAL HIGH (ref 70–99)
Glucose-Capillary: 200 mg/dL — ABNORMAL HIGH (ref 70–99)
Glucose-Capillary: 156 mg/dL — ABNORMAL HIGH (ref 70–99)

## 2012-06-16 LAB — CBC
Platelets: 305 10*3/uL (ref 150–400)
RBC: 4.83 MIL/uL (ref 3.87–5.11)
RDW: 15.8 % — ABNORMAL HIGH (ref 11.5–15.5)
WBC: 12.2 10*3/uL — ABNORMAL HIGH (ref 4.0–10.5)

## 2012-06-16 LAB — CARDIAC PANEL(CRET KIN+CKTOT+MB+TROPI)
CK, MB: 6 ng/mL — ABNORMAL HIGH (ref 0.3–4.0)
CK, MB: 6.5 ng/mL (ref 0.3–4.0)
Relative Index: 5.1 — ABNORMAL HIGH (ref 0.0–2.5)
Total CK: 140 U/L (ref 7–177)
Total CK: 128 U/L (ref 7–177)
Troponin I: 0.83 ng/mL (ref <0.30)

## 2012-06-16 LAB — LIPID PANEL
Total CHOL/HDL Ratio: 4.9 RATIO
VLDL: 25 mg/dL (ref 0–40)

## 2012-06-16 LAB — HEPARIN LEVEL (UNFRACTIONATED): Heparin Unfractionated: 0.12 IU/mL — ABNORMAL LOW (ref 0.30–0.70)

## 2012-06-16 MED ORDER — HEPARIN BOLUS VIA INFUSION
3000.0000 [IU] | Freq: Once | INTRAVENOUS | Status: AC
  Administered 2012-06-16: 3000 [IU] via INTRAVENOUS
  Filled 2012-06-16: qty 3000

## 2012-06-16 MED ORDER — HEPARIN (PORCINE) IN NACL 100-0.45 UNIT/ML-% IJ SOLN
2450.0000 [IU]/h | INTRAMUSCULAR | Status: DC
  Administered 2012-06-16 – 2012-06-17 (×2): 2150 [IU]/h via INTRAVENOUS
  Administered 2012-06-17 (×2): 2450 [IU]/h via INTRAVENOUS
  Filled 2012-06-16 (×6): qty 250

## 2012-06-16 NOTE — Progress Notes (Signed)
ANTICOAGULATION CONSULT NOTE - Follow Up Consult  Pharmacy Consult for heparin Indication: chest pain/ACS  Allergies  Allergen Reactions  . Codeine Hives, Itching and Other (See Comments)    "breathing problems"  . Latex Itching    Patient Measurements: Height: 5\' 5"  (165.1 cm) (stated) Weight: 299 lb 1.6 oz (135.671 kg) (c scale) IBW/kg (Calculated) : 57  Heparin Dosing Weight: 90.6 kg  Vital Signs: Temp: 97.2 F (36.2 C) (07/25 1406) Temp src: Oral (07/25 1406) BP: 116/58 mmHg (07/25 1406) Pulse Rate: 58  (07/25 1406)  Labs:  Basename 06/16/12 1950 06/16/12 1230 06/16/12 1051 06/16/12 0614 06/16/12 0257 06/15/12 2322 06/15/12 2320 06/15/12 1552  HGB -- -- -- -- 14.6 -- -- 14.4  HCT -- -- -- -- 45.1 -- -- 44.7  PLT -- -- -- -- 305 -- -- 239  APTT -- -- -- -- -- -- -- --  LABPROT -- -- -- -- -- 12.4 -- --  INR -- -- -- -- -- 0.91 -- --  HEPARINUNFRC 0.12* <0.10* -- -- <0.10* -- -- --  CREATININE -- -- -- -- 0.69 -- -- 0.65  CKTOTAL -- -- 128 136 -- -- 140 --  CKMB -- -- 6.5* 6.6* -- -- 6.0* --  TROPONINI -- -- 0.83* 0.80* -- -- 1.00* --    Estimated Creatinine Clearance: 109.7 ml/min (by C-G formula based on Cr of 0.69).   Medications:  Heparin at 1850 units/hr  Assessment: 56 yo female with ACS is currently on subtherapeutic heparin.  Heparin level was 0.12.    Goal of Therapy:  Heparin level 0.3-0.7 units/ml Monitor platelets by anticoagulation protocol: Yes   Plan:  1) Heparin 3000 units iv bolus x1, then increase drip to 2150 units/hr. 2) Check a 6hr heparin level.  Aella Ronda, [name] 06/16/2012,8:40 PM

## 2012-06-16 NOTE — Progress Notes (Signed)
ANTICOAGULATION CONSULT NOTE - Follow Up Consult  Pharmacy Consult for heparin Indication: chest pain/ACS  Labs:  Basename 06/16/12 0257 06/15/12 2322 06/15/12 2320 06/15/12 2027 06/15/12 1955 06/15/12 1810 06/15/12 1552  HGB 14.6 -- -- -- -- -- 14.4  HCT 45.1 -- -- -- -- -- 44.7  PLT 305 -- -- -- -- -- 239  APTT -- -- -- -- -- -- --  LABPROT -- 12.4 -- -- -- -- --  INR -- 0.91 -- -- -- -- --  HEPARINUNFRC <0.10* -- -- -- -- -- --  CREATININE 0.69 -- -- -- -- -- 0.65  CKTOTAL -- -- 140 -- 140 -- --  CKMB -- -- 6.0* -- 5.2* -- --  TROPONINI -- -- 1.00* 0.67* -- 0.87* --    Assessment: 56yo female undetectable on heparin with initial dosing for possible ACS given elevated troponin though suspected may be due to respiratory distress.  Goal of Therapy:  Heparin level 0.3-0.7 units/ml   Plan:  Will give heparin bolus of 3000 units and increase heparin gtt by 4 units/kg/hr to 1500 units/hr and check level in 6hr.  Colleen Can PharmD BCPS 06/16/2012,6:40 AM

## 2012-06-16 NOTE — Progress Notes (Signed)
   CARE MANAGEMENT NOTE 06/16/2012  Patient:  Margaret [name], Margaret [name]   Account Number:  [id number]  Date Initiated:  06/16/2012  Documentation initiated by:  Tera Mater  Subjective/Objective Assessment:   56yo female admitted with SOB.  Pt. lives at home w/spouse. Has oxygen at home with [facility]     Action/Plan:   Discharge planning and medication assitance   Anticipated DC Date:  06/18/2012   Anticipated DC Plan:  HOME/SELF CARE      DC Planning Services  CM consult  Medication Assistance      Choice offered to / List presented to:             Status of service:  In process, will continue to follow Medicare Important Message given?  NO (If response is "NO", the following Medicare IM given date fields will be blank) Date Medicare IM given:   Date Additional Medicare IM given:    Discharge Disposition:    Per UR Regulation:  Reviewed for med. necessity/level of care/duration of stay  If discussed at Long Length of Stay Meetings, dates discussed:    Comments:  06/15/12 1027 Tera Mater, RN, BSN NCM [phone] Pt. needs assistance with medications.  Last month pt. was admitted with PNA and used the indigent fund for medication assistance.  TC to [place], with Financial assistance, to help pt. obtain Medicaid.  Will speak with pt. regarding medication assistance as well.

## 2012-06-16 NOTE — Progress Notes (Signed)
Pt says she has discussed the heart cath option with her husband.  Pt says she has decided that she wants to have the heart cath done.  Notified Dr. Katrinka Blazing of pt decision.

## 2012-06-16 NOTE — Progress Notes (Signed)
I cosign for Margaret [name]'s assessment, med administration, notes, I/O, and care plan education. 

## 2012-06-16 NOTE — Progress Notes (Signed)
Pt. With panic CKMB lab value of 6.6 this am. Attempt to notify MD made via answering service. Oncoming RN made aware. Will continue to monitor pt. Daniella Dewberry, Cheryll Dessert

## 2012-06-16 NOTE — Progress Notes (Signed)
ANTICOAGULATION CONSULT NOTE - Follow Up Consult  Pharmacy Consult for Heparin Indication: ACS  Allergies  Allergen Reactions  . Codeine Hives, Itching and Other (See Comments)    "breathing problems"  . Latex Itching    Patient Measurements: Height: 5\' 5"  (165.1 cm) (stated) Weight: 299 lb 1.6 oz (135.671 kg) (c scale) IBW/kg (Calculated) : 57  Heparin Dosing Weight: 90.6kg  Vital Signs: Temp: 98.4 F (36.9 C) (07/25 0457) Temp src: Oral (07/25 0457) BP: 138/78 mmHg (07/25 1021) Pulse Rate: 78  (07/25 1021)  Labs:  Basename 06/16/12 1230 06/16/12 1051 06/16/12 0614 06/16/12 0257 06/15/12 2322 06/15/12 2320 06/15/12 1552  HGB -- -- -- 14.6 -- -- 14.4  HCT -- -- -- 45.1 -- -- 44.7  PLT -- -- -- 305 -- -- 239  APTT -- -- -- -- -- -- --  LABPROT -- -- -- -- 12.4 -- --  INR -- -- -- -- 0.91 -- --  HEPARINUNFRC <0.10* -- -- <0.10* -- -- --  CREATININE -- -- -- 0.69 -- -- 0.65  CKTOTAL -- 128 136 -- -- 140 --  CKMB -- 6.5* 6.6* -- -- 6.0* --  TROPONINI -- 0.83* 0.80* -- -- 1.00* --    Estimated Creatinine Clearance: 109.7 ml/min (by C-G formula based on Cr of 0.69).   Medications:  Heparin 1500 units/hr   Assessment: 56yof on heparin for possible ACS with elevated cardiac enzymes. Heparin level (<0.1) remains subtherapeutic despite bolus and rate increase. RN reports no problems with line or infusion.  - H/H and Plts wnl - No significant bleeding reported  Goal of Therapy:  Heparin level 0.3-0.7 units/ml Monitor platelets by anticoagulation protocol: Yes   Plan:  1. Heparin IV bolus 3000 units x 1 2. Increase heparin drip to 1850 units/hr (18.5 ml/hr) 3. Check heparin level 6 hours after rate increase 4. Continue daily heparin levels and CBC  Cleon Dew [phone] 06/16/2012,1:40 PM

## 2012-06-16 NOTE — Progress Notes (Signed)
Patient Name: Margaret [name] Date of Encounter: 06/16/2012    SUBJECTIVE: Her primary physician is Dr. Feliciana Rossetti. She was recently hospitalized with pneumonia, obesity hypoventilation syndrome, and probable emphysema. She was admitted by the fellow last night after suddenly becoming short of breath. This occurred after running out of her chronic oxygen. Upon arrival in the emergency room proved to point-of-care troponin I cardiac markers were elevated. Because of this she was admitted to cardiology. She has not had chest pain or ischemic symptoms. Dyspnea has been the only complaint. The dyspnea improved with bronchodilator therapy. Her EKG is normal to  TELEMETRY:  Normal sinus rhythm. Filed Vitals:   06/15/12 2145 06/15/12 2300 06/16/12 0457 06/16/12 1021  BP: 130/86 157/71 160/72 138/78  Pulse: 90 86 81 78  Temp:  98.8 F (37.1 C) 98.4 F (36.9 C)   TempSrc:  Oral Oral   Resp: 17  21   Height:  5\' 5"  (1.651 m)    Weight:  136.397 kg (300 lb 11.2 oz) 135.671 kg (299 lb 1.6 oz)   SpO2: 96% 96% 96%     Intake/Output Summary (Last 24 hours) at 06/16/12 1114 Last data filed at 06/16/12 0958  Gross per 24 hour  Intake     94 ml  Output   3500 ml  Net  -3406 ml    LABS: Basic Metabolic Panel:  Basename 06/16/12 0257 06/15/12 1552  NA 139 139  K 4.4 3.9  CL 99 101  CO2 27 28  GLUCOSE 348* 216*  BUN 14 10  CREATININE 0.69 0.65  CALCIUM 10.2 9.9  MG -- --  PHOS -- --   CBC:  Basename 06/16/12 0257 06/15/12 1552  WBC 12.2* 9.8  NEUTROABS -- --  HGB 14.6 14.4  HCT 45.1 44.7  MCV 93.4 94.3  PLT 305 239   Cardiac Enzymes:  Basename 06/16/12 0614 06/15/12 2320 06/15/12 2027 06/15/12 1955  CKTOTAL 136 140 -- 140  CKMB 6.6* 6.0* -- 5.2*  CKMBINDEX -- -- -- --  TROPONINI 0.80* 1.00* 0.67* --   BNP (last 3 results)  Basename 06/15/12 1810 04/28/12 0605 04/27/12 1602  PROBNP 280.0* 9.3 896.1*    Fasting Lipid Panel:  Basename 06/16/12 0614  CHOL 228*    HDL 47  LDLCALC 156*  TRIG 127  CHOLHDL 4.9  LDLDIRECT --    Radiology/Studies:   *RADIOLOGY REPORT*  Clinical Data: Shortness of breath, history asthma, COPD, smoking  PORTABLE CHEST - 1 VIEW  Comparison: Portable exam 1559 hours compared to 05/17/2012  Findings:  Enlargement of cardiac silhouette with pulmonary vascular  congestion.  Questionable acute versus chronic interstitial infiltrates in the  perihilar regions, greater on the right, potentially representing  pulmonary edema.  Suboptimal visualization of left base due to underpenetration.  No definite pleural effusion or pneumothorax on limited assessment.  Endplate spur formation thoracic spine.  IMPRESSION:  Enlargement of cardiac silhouette with pulmonary vascular  congestion.  Questionable perihilar infiltrate/edema.  Original Report Authenticated By: Lollie Marrow, M.D.  Physical Exam: Blood pressure 138/78, pulse 78, temperature 98.4 F (36.9 C), temperature source Oral, resp. rate 21, height 5\' 5"  (1.651 m), weight 135.671 kg (299 lb 1.6 oz), SpO2 96.00%. Weight change:    Morbid obesity.  No shortness of breath while sitting in chair at her bedside.  No wheezing, rhonchi, or rales are heard on auscultation of the lungs posteriorly.  Cardiac exam reveals no gallop, rub, click, or murmur.  2+ bilateral lower extremity  edema from ankles to the knees  Neurologically intact  ASSESSMENT:  1. Elevated troponin of no questionable clinical significance and likely related to demand ischemia in the setting of obesity, hypoxia, and pulmonary hypertension.  2. Probable obstructive sleep apnea  3. Hypertension  4. COPD/chronic bronchitis with prior smoking history, recently discontinuing the habit in June 2013.  5. Risk factors for coronary disease including diabetes, obesity, age, family history, and smoking.  Plan:  1. Consider pulmonary consultation.  2. She and I discussed the need for exclusion of  coronary disease. Nuclear scintigraphy will be fraught with poor image quality and coronary angiography (favorable approach) has risk. The patient is concerned about cost. She has no insurance. At this time she like to discuss with her family before making any further commitments. I did reassure her that our main concern is to make sure we have identified her medical problems and providing adequate and appropriate care. therapy.  Selinda Eon 06/16/2012, 11:14 AM

## 2012-06-17 DIAGNOSIS — J449 Chronic obstructive pulmonary disease, unspecified: Secondary | ICD-10-CM

## 2012-06-17 DIAGNOSIS — R0902 Hypoxemia: Secondary | ICD-10-CM

## 2012-06-17 DIAGNOSIS — I509 Heart failure, unspecified: Secondary | ICD-10-CM

## 2012-06-17 DIAGNOSIS — E669 Obesity, unspecified: Secondary | ICD-10-CM

## 2012-06-17 DIAGNOSIS — F17200 Nicotine dependence, unspecified, uncomplicated: Secondary | ICD-10-CM

## 2012-06-17 LAB — GLUCOSE, CAPILLARY: Glucose-Capillary: 141 mg/dL — ABNORMAL HIGH (ref 70–99)

## 2012-06-17 LAB — CBC
MCV: 94.4 fL (ref 78.0–100.0)
Platelets: 277 10*3/uL (ref 150–400)
RBC: 4.62 MIL/uL (ref 3.87–5.11)
RDW: 16.5 % — ABNORMAL HIGH (ref 11.5–15.5)
WBC: 14 10*3/uL — ABNORMAL HIGH (ref 4.0–10.5)

## 2012-06-17 LAB — BASIC METABOLIC PANEL
BUN: 18 mg/dL (ref 6–23)
CO2: 33 mEq/L — ABNORMAL HIGH (ref 19–32)
Chloride: 99 mEq/L (ref 96–112)
Creatinine, Ser: 0.81 mg/dL (ref 0.50–1.10)
Potassium: 3.6 mEq/L (ref 3.5–5.1)

## 2012-06-17 MED ORDER — ATORVASTATIN CALCIUM 20 MG PO TABS
20.0000 mg | ORAL_TABLET | Freq: Every day | ORAL | Status: DC
  Administered 2012-06-17 – 2012-06-19 (×3): 20 mg via ORAL
  Filled 2012-06-17 (×4): qty 1

## 2012-06-17 MED ORDER — HEPARIN (PORCINE) IN NACL 100-0.45 UNIT/ML-% IJ SOLN
2600.0000 [IU]/h | INTRAMUSCULAR | Status: DC
  Administered 2012-06-18 (×3): 2600 [IU]/h via INTRAVENOUS
  Filled 2012-06-17 (×7): qty 250

## 2012-06-17 MED ORDER — BECLOMETHASONE DIPROPIONATE 40 MCG/ACT IN AERS
2.0000 | INHALATION_SPRAY | Freq: Two times a day (BID) | RESPIRATORY_TRACT | Status: DC
  Administered 2012-06-17 – 2012-06-20 (×6): 2 via RESPIRATORY_TRACT
  Filled 2012-06-17: qty 8.7

## 2012-06-17 MED ORDER — FUROSEMIDE 40 MG PO TABS
40.0000 mg | ORAL_TABLET | Freq: Every day | ORAL | Status: DC
  Administered 2012-06-17 – 2012-06-20 (×4): 40 mg via ORAL
  Filled 2012-06-17 (×4): qty 1

## 2012-06-17 MED ORDER — ALBUTEROL SULFATE (5 MG/ML) 0.5% IN NEBU: 2.5000 mg | INHALATION_SOLUTION | RESPIRATORY_TRACT | Status: DC | PRN

## 2012-06-17 MED ORDER — ATENOLOL 25 MG PO TABS
25.0000 mg | ORAL_TABLET | Freq: Every day | ORAL | Status: DC
  Administered 2012-06-18 – 2012-06-20 (×3): 25 mg via ORAL
  Filled 2012-06-17 (×3): qty 1

## 2012-06-17 MED ORDER — SPIRONOLACTONE 25 MG PO TABS
25.0000 mg | ORAL_TABLET | Freq: Every day | ORAL | Status: DC
  Administered 2012-06-17 – 2012-06-20 (×4): 25 mg via ORAL
  Filled 2012-06-17 (×4): qty 1

## 2012-06-17 MED ORDER — LIVING WELL WITH DIABETES BOOK
Freq: Once | Status: AC
  Administered 2012-06-17: 15:00:00
  Filled 2012-06-17: qty 1

## 2012-06-17 NOTE — Progress Notes (Signed)
Dr. Mayford Knife called back . With med changed atenolol 50 mgs to  Atenolol 25 mgs satarting in am

## 2012-06-17 NOTE — Progress Notes (Signed)
Patient Name: Margaret [name] Date of Encounter: 06/17/2012    SUBJECTIVE: The patient has had no chest pain but now complaining of wheezing and dyspnea. No cough chills or wheezes.  TELEMETRY:  NSR: Filed Vitals:   06/16/12 1406 06/16/12 2036 06/16/12 2132 06/17/12 0455  BP: 116/58  135/63 121/58  Pulse: 58  56 66  Temp: 97.2 F (36.2 C)  97.8 F (36.6 C) 97.9 F (36.6 C)  TempSrc: Oral  Oral Oral  Resp: 20  20 18   Height:      Weight:    134.083 kg (295 lb 9.6 oz)  SpO2: 97% 99% 100% 93%    Intake/Output Summary (Last 24 hours) at 06/17/12 0853 Last data filed at 06/17/12 6578  Gross per 24 hour  Intake 1216.91 ml  Output   3950 ml  Net -2733.09 ml   NET since admission = -5400 cc  LABS: Basic Metabolic Panel:  Basename 06/16/12 0257 06/15/12 1552  NA 139 139  K 4.4 3.9  CL 99 101  CO2 27 28  GLUCOSE 348* 216*  BUN 14 10  CREATININE 0.69 0.65  CALCIUM 10.2 9.9  MG -- --  PHOS -- --   CBC:  Basename 06/17/12 0639 06/16/12 0257  WBC 14.0* 12.2*  NEUTROABS -- --  HGB 14.0 14.6  HCT 43.6 45.1  MCV 94.4 93.4  PLT 277 305   Cardiac Enzymes:  Basename 06/16/12 1051 06/16/12 0614 06/15/12 2320  CKTOTAL 128 136 140  CKMB 6.5* 6.6* 6.0*  CKMBINDEX -- -- --  TROPONINI 0.83* 0.80* 1.00*   BNP: BNP    Component Value Date/Time   PROBNP 280.0* 06/15/2012 1810   Hemoglobin A1C:  Basename 06/15/12 2322  HGBA1C 6.8*   Fasting Lipid Panel:  Basename 06/16/12 0614  CHOL 228*  HDL 47  LDLCALC 156*  TRIG 127  CHOLHDL 4.9  LDLDIRECT --   ECHO:  ------------------------------------------------------------ LV EF: 60% - 65%  ------------------------------------------------------------ Indications: Previous study 09/25/2005. CHF - 428.0.  ------------------------------------------------------------ History: PMH: Pneumonia. Obesity. Hypoxia. Dyspnea on exertion. Chronic obstructive pulmonary disease. Risk factors: Current tobacco use.  Hypertension. Diabetes mellitus.  ------------------------------------------------------------ Study Conclusions  - Left ventricle: The cavity size was normal. Wall thickness was increased in a pattern of mild LVH. Systolic function was normal. The estimated ejection fraction was in the range of 60% to 65%. - Mitral valve: Mild regurgitation   Radiology/Studies:  No new  Physical Exam: Blood pressure 121/58, pulse 66, temperature 97.9 F (36.6 C), temperature source Oral, resp. rate 18, height 5\' 5"  (1.651 m), weight 134.083 kg (295 lb 9.6 oz), SpO2 93.00%. Weight change: 3.283 kg (7 lb 3.8 oz)   Rhonchi and wheezes  No gallop, rub or murmur  2 + edema  ASSESSMENT:  1. Mildly elevated markers are multifactorial and not related to ACS. May have underlying CAD but should be evaluated after the pulmonary status is stable, probably as OP with cath or Nuclear(less favored).  2. Respiratory failure with recurrent wheezing this AM despite 5.4 Liter diuresis. Therefore, current wheezing related to intrinsic lung disease and not CHF. I also suspect OSA.  3. Hyperlipidemia  4. Morbid obesity  5. Acute on chronic diastolic heart failure improved with diuresis   Plan:  1. Start statin 2. Pulmonary consult 3. Check d-dimer 4. Will need sleep study 5. Change lasix to oral therapy  Signed, Lesleigh Noe 06/17/2012, 8:53 AM

## 2012-06-17 NOTE — Plan of Care (Signed)
Problem: Food- and Nutrition-Related Knowledge Deficit (NB-1.1) Goal: Nutrition education Formal process to instruct or train a patient/client in a skill or to impart knowledge to help patients/clients voluntarily manage or modify food choices and eating behavior to maintain or improve health.  Outcome: Completed/Met Date Met:  06/17/12  RD consulted for nutrition education regarding diabetes.     Lab Results  Component Value Date    HGBA1C 6.8* 06/15/2012    RD provided "Carbohydrate Counting for People with Diabetes" handout from the Academy of [employer]. Discussed different food groups and their effects on blood sugar, emphasizing carbohydrate-containing foods. Provided list of carbohydrates and recommended serving sizes of common foods.  Discussed importance of controlled and consistent carbohydrate intake throughout the day. Provided examples of ways to balance meals/snacks and encouraged intake of high-fiber, whole grain complex carbohydrates.  Expect good compliance. Pt reports she has been trying to learn more about how to eat but was very confused. Making good changes to diet and wants to continue.   Body mass index is 49.19 kg/(m^2). Pt meets criteria for morbid obesity based on current BMI.  Current diet order is Carb Mod Medium, patient is consuming approximately 100% of meals at this time. Labs and medications reviewed. No further nutrition interventions warranted at this time. If additional nutrition issues arise, please re-consult RD.  Clarene Duke RD, LDN Pager [phone] After Hours pager [phone]

## 2012-06-17 NOTE — Consult Note (Signed)
Name: Margaret [name] MRN: [id number] DOB: 1956-02-01    LOS: 2 Consulting MD Katrinka Blazing Reason for consult: COPD  Cultures:   Antibiotics:  Tests / Events: 6/25: EF 60-65%mild LVH  History of Present Illness:  56 y.o.female with recently diagnosed COPD (F/B CY, last seen 6/25) who presents to the ED with SOB on 7/24. On 7/24 while at work, she ran out of her oxygen and became severely short of breath while trying to get her other bottle. She was out for about 15 minutes. She felt extreme air hunger and became panicked. EMS was called, she responded well to oxygen and breathing treatments. She denied having had chest pain, but did report that she had noticed some increasing SOB over the last few days prior to this event, as well as increased orthopnea and PND. She also reported lower ext edema and increased fullness in her abdomen. Denied  any fevers, chills, or productive cough. She was admitted to cardiologies service, evaluation demonstrated: mildly elevated CEs, and CXR w/ pulmonary edema. She was treated w/ IV lasix and had 5.4 liters of diuresis as of 7/26. As 7/25 she felt like she was heading in right direction, then  In the am reported she was  wheezing more w/ yellow colored sputum production w. Cough. On pulm eval she still feels like her dyspnea is at baseline and she notes substantial  Improvement  after Bronchodilator administration. Because of her on-going  wheeze in spite of what appears to be favorable diuresis Pulmonary was asked to evaluate given concern that her underlying obstructive lung disease may now be her barrier to improvement. Of note she has not been able to take her spiriva and advair due to financial restraints, and that she has been managing on oxygen and scheduled SABA only. Also the PM of 7/25 into the am hours before she developed the progressive wheezing she had been sleeping without her oxygen.    Review of Systems  Constitutional: No weight loss,  gain, night sweats, Fevers, chills, fatigue .  HEENT: No headaches, visual changes, Difficulty swallowing, Tooth/dental problems, or Sore throat,  No sneezing, itching, ear ache, nasal congestion, post nasal drip, no visual complaints CV: No chest pain,+  Orthopnea,+ PND, + swelling in lower extremities, - dizziness,- palpitations,- syncope.  GI No heartburn, indigestion, abdominal pain, nausea, vomiting, diarrhea, change in bowel habits, loss of appetite, bloody stools.  Resp: No cough, No coughing up of blood. Wheezing and cough as per HPI Skin: no rash or itching or icterus GU: no dysuria, change in color of urine, no urgency or frequency. No flank pain, no hematuria  MS: No joint pain or swelling. No decreased range of motion  Psych: No change in mood or affect. No depression or anxiety.  Neuro: no difficulty with speech, weakness, numbness, ataxia     Past Medical History  Diagnosis Date  . Hypertension   . Hyperlipidemia   . COPD (chronic obstructive pulmonary disease) 04/20/12  . Asthma   . Type II diabetes mellitus   . Anemia   . H/O hiatal hernia   . Osteoarthritis   . Pneumonia 04/2012  . Migraines   . Panic attacks 04/20/12  . Morbid obesity    Past Surgical History  Procedure Date  . Laceration repair 1966    "right upper arm"  . Dilation and curettage of uterus 1990's  . Finger reattachment 1973    "right ring finger"   Prior to Admission medications  Medication Sig Start Date End Date Taking? Authorizing Provider  albuterol (PROVENTIL HFA;VENTOLIN HFA) 108 (90 BASE) MCG/ACT inhaler Inhale 2 puffs into the lungs every 4 (four) hours as needed. For shortness of breath. 04/30/12  Yes Shanker Levora Dredge, MD  albuterol (PROVENTIL) (2.5 MG/3ML) 0.083% nebulizer solution Take 2.5 mg by nebulization every 6 (six) hours as needed. For shortness of breath   Yes Historical Provider, MD  aspirin 81 MG chewable tablet Chew 81 mg by mouth daily.   Yes Historical Provider, MD    atenolol (TENORMIN) 50 MG tablet Take 50 mg by mouth daily.   Yes Historical Provider, MD  fish oil-omega-3 fatty acids 1000 MG capsule Take 1 g by mouth daily.   Yes Historical Provider, MD  furosemide (LASIX) 40 MG tablet Take 20-40 mg by mouth 2 (two) times daily. Take 1 tablet in the morning and take one-half of a tablet every night at bedtime.   Yes Historical Provider, MD  metFORMIN (GLUCOPHAGE) 500 MG tablet Take 500 mg by mouth 2 (two) times daily with a meal. 04/30/12 04/30/13 Yes Shanker Levora Dredge, MD   Allergies Allergies  Allergen Reactions  . Codeine Hives, Itching and Other (See Comments)    "breathing problems"  . Latex Itching    Family History Family History  Problem Relation Age of Onset  . Allergies    . Hypertension    . Coronary artery disease Mother 83  . Stroke Father   . COPD Father     Social History  reports that she quit smoking about 8 weeks ago. Her smoking use included Cigarettes. She has a 8 pack-year smoking history. She has never used smokeless tobacco. She reports that she does not drink alcohol or use illicit drugs.   Vital Signs: BP 132/77  Pulse 61  Temp 97.9 F (36.6 C) (Oral)  Resp 18  Ht 5\' 5"  (1.651 m)  Wt 134.083 kg (295 lb 9.6 oz)  BMI 49.19 kg/m2  SpO2 98%       . heparin 2,150 Units/hr (06/17/12 0428)  . DISCONTD: heparin 1,850 Units/hr (06/16/12 1351)     Intake/Output Summary (Last 24 hours) at 06/17/12 1106 Last data filed at 06/17/12 1022  Gross per 24 hour  Intake 1416.91 ml  Output   3200 ml  Net -1783.09 ml    Physical Examination: General:  Obese white female, not currently in acute distress.  Neuro:  Alert and oriented w/out any focal def HEENT:  [place]. No JVD, no upper airway wheeze Cardiovascular:  rrr Lungs:  Clear and decreased in bases Abdomen:  Obese, non-tender Musculoskeletal:  intact Skin:  LE swelling   Ventilator settings:    Labs and Imaging:   Lab 06/17/12 0930 06/16/12 0257 06/15/12 1552   NA 141 139 139  K 3.6 4.4 3.9  CL 99 99 101  CO2 33* 27 28  BUN 18 14 10   CREATININE 0.81 0.69 0.65  GLUCOSE 144* 348* 216*    Lab 06/17/12 0639 06/16/12 0257 06/15/12 1552  HGB 14.0 14.6 14.4  HCT 43.6 45.1 44.7  WBC 14.0* 12.2* 9.8  PLT 277 305 239    Assessment and Plan: Acute on Chronic Multifactorial dyspnea. Agree that this initially was due to decompensated diastolic heart failure w/ volume overload in setting what is probably underlying decompensated OHS/OSA, but complicated by baseline COPD +/- mild exacerbation vs just sub-optimal treatment regimen. She is not currently having any wheeze, but recently had a breathing treatment. Have called [facility]  and verified the generic med list and both ipratropium and albuterol nebululized are on the $4 list. The Cheapest inhaled steroid is QVar at $135 for monthly supply. Have discussed this to pt and she is agreeable to this plan for short time. We will try to support her via our office (she has an appointment on Aug 6th) with samples as well (there are no generic $4 dollar or inexpensive options for Inhaled steroids).  Recommendation  1) change BD regimen to Albuterol and Ipratropium QID w/PRN albuterol 2) add Qvar (be sure to send her home with this from hospital) 3) continue oxygen 24/7 4) we will see her on Aug 6th, she will have PFTs, 6 minute walk test and appointment w/ Dr Maple Hudson.  5) will defer further evaluation of what is likely OSA to Dr Maple Hudson in follow-up appointments.  6) no need for pulse steroid or abx   Abnormal CEs, HL  Plan: Per cards.    BABCOCK,PETE 06/17/2012, 11:06 AM  Patient likely has undertreated obstructive lung disease.  Adjusted bronchodilators as above, will need f/u with Dr. Maple Hudson for sleep study.  No indication of steroids at this point, will need to go home with home O2 if not already set up.  PCCM will see again on Monday.  Patient seen and examined, agree with above note.  I dictated the care and  orders written for this patient under my direction.  Koren Bound, M.D. [phone]

## 2012-06-17 NOTE — Progress Notes (Signed)
Spoke with patient about her diabetes.  This is a fairly new diagnosis.  Sees Dr. Feliciana Rossetti as her PCP.  Has been on Metformin twice a day at home for a few weeks.  HgbA1C is 6.8%.  Does not have insurance, but has been seen by case management in regards to this situation.  Needs a glucose meter, but states cannot afford the strips.  States that this hospitalization has been a wake up call!  Staff nurses to have patient watch DM videos, ordered Living Well with Diabetes booklet, give [place] notes on diabetes to patient, and have a dietician to talk with patient.  Will continue to follow while in hospital.

## 2012-06-17 NOTE — Progress Notes (Signed)
ANTICOAGULATION CONSULT NOTE - Follow Up Consult  Pharmacy Consult for heparin Indication: chest pain/ACS  Allergies  Allergen Reactions  . Codeine Hives, Itching and Other (See Comments)    "breathing problems"  . Latex Itching    Patient Measurements: Height: 5\' 5"  (165.1 cm) (stated) Weight: 295 lb 9.6 oz (134.083 kg) (scale C) IBW/kg (Calculated) : 57  Heparin Dosing Weight: 91 kg  Vital Signs: Temp: 97.9 F (36.6 C) (07/26 1449) Temp src: Oral (07/26 1449) BP: 115/56 mmHg (07/26 1449) Pulse Rate: 50  (07/26 1449)  Labs:  Basename 06/17/12 1834 06/17/12 0930 06/17/12 0639 06/16/12 1950 06/16/12 1051 06/16/12 0614 06/16/12 0257 06/15/12 2322 06/15/12 2320 06/15/12 1552  HGB -- -- 14.0 -- -- -- 14.6 -- -- --  HCT -- -- 43.6 -- -- -- 45.1 -- -- 44.7  PLT -- -- 277 -- -- -- 305 -- -- 239  APTT -- -- -- -- -- -- -- -- -- --  LABPROT -- -- -- -- -- -- -- 12.4 -- --  INR -- -- -- -- -- -- -- 0.91 -- --  HEPARINUNFRC 0.33 -- <0.10* 0.12* -- -- -- -- -- --  CREATININE -- 0.81 -- -- -- -- 0.69 -- -- 0.65  CKTOTAL -- -- -- -- 128 136 -- -- 140 --  CKMB -- -- -- -- 6.5* 6.6* -- -- 6.0* --  TROPONINI -- -- -- -- 0.83* 0.80* -- -- 1.00* --    Estimated Creatinine Clearance: 107.5 ml/min (by C-G formula based on Cr of 0.81).      Assessment: 80 YOF with ACS currently on IV heparin. Heparin level now therapeutic but on the lower side. Will adjust rate slightly.   Goal of Therapy:  Heparin level 0.3-0.7 units/ml Monitor platelets by anticoagulation protocol: Yes    Plan:  - Increase heparin to 2600 units/hr - F/u with AM heparin level

## 2012-06-17 NOTE — Progress Notes (Signed)
1512 placed a call to Dr. Katrinka Blazing . DR. Mayford Knife is covering re: SB

## 2012-06-17 NOTE — Progress Notes (Signed)
1442 cardiac monitor strip showed SB 46 . Pt resting comfortably in chair denied dizziness , cp or shortness of breath . Continue monitoring done

## 2012-06-17 NOTE — Progress Notes (Signed)
ANTICOAGULATION CONSULT NOTE - Follow Up Consult  Pharmacy Consult for heparin Indication: chest pain/ACS  Allergies  Allergen Reactions  . Codeine Hives, Itching and Other (See Comments)    "breathing problems"  . Latex Itching    Patient Measurements: Height: 5\' 5"  (165.1 cm) (stated) Weight: 295 lb 9.6 oz (134.083 kg) (scale C) IBW/kg (Calculated) : 57  Heparin Dosing Weight: 91 kg  Vital Signs: Temp: 97.9 F (36.6 C) (07/26 0455) Temp src: Oral (07/26 0455) BP: 121/58 mmHg (07/26 0455) Pulse Rate: 66  (07/26 0455)  Labs:  Basename 06/17/12 0639 06/16/12 1950 06/16/12 1230 06/16/12 1051 06/16/12 0614 06/16/12 0257 06/15/12 2322 06/15/12 2320 06/15/12 1552  HGB 14.0 -- -- -- -- 14.6 -- -- --  HCT 43.6 -- -- -- -- 45.1 -- -- 44.7  PLT 277 -- -- -- -- 305 -- -- 239  APTT -- -- -- -- -- -- -- -- --  LABPROT -- -- -- -- -- -- 12.4 -- --  INR -- -- -- -- -- -- 0.91 -- --  HEPARINUNFRC <0.10* 0.12* <0.10* -- -- -- -- -- --  CREATININE -- -- -- -- -- 0.69 -- -- 0.65  CKTOTAL -- -- -- 128 136 -- -- 140 --  CKMB -- -- -- 6.5* 6.6* -- -- 6.0* --  TROPONINI -- -- -- 0.83* 0.80* -- -- 1.00* --    Estimated Creatinine Clearance: 108.8 ml/min (by C-G formula based on Cr of 0.69).      Assessment: 50 YOF with ACS currently on IV heparin with subtherapeutic heparin level.  No interruption with infusion and IV site intact per RN.  Patient denied bleeding.  Awaiting cath.   Goal of Therapy:  Heparin level 0.3-0.7 units/ml Monitor platelets by anticoagulation protocol: Yes    Plan:  - Increase heparin gtt to 2450 units/hr - Check 6 hr HL if not in cath - F/U daily      Adelbert Gaspard D. Laney Potash, PharmD, BCPS Pager:  [phone] 06/17/2012, 8:12 AM

## 2012-06-18 ENCOUNTER — Inpatient Hospital Stay (HOSPITAL_COMMUNITY): Payer: Self-pay

## 2012-06-18 LAB — CBC
HCT: 42.4 % (ref 36.0–46.0)
Hemoglobin: 13.9 g/dL (ref 12.0–15.0)
MCH: 31.1 pg (ref 26.0–34.0)
MCHC: 32.8 g/dL (ref 30.0–36.0)
RBC: 4.47 MIL/uL (ref 3.87–5.11)

## 2012-06-18 LAB — BASIC METABOLIC PANEL
CO2: 32 mEq/L (ref 19–32)
Calcium: 9.5 mg/dL (ref 8.4–10.5)
Chloride: 101 mEq/L (ref 96–112)
Glucose, Bld: 137 mg/dL — ABNORMAL HIGH (ref 70–99)
Sodium: 138 mEq/L (ref 135–145)

## 2012-06-18 LAB — GLUCOSE, CAPILLARY

## 2012-06-18 MED ORDER — ALPRAZOLAM 0.25 MG PO TABS
0.2500 mg | ORAL_TABLET | Freq: Once | ORAL | Status: AC
  Administered 2012-06-18: 0.25 mg via ORAL
  Filled 2012-06-18: qty 1

## 2012-06-18 MED ORDER — IOHEXOL 350 MG/ML SOLN
100.0000 mL | Freq: Once | INTRAVENOUS | Status: AC | PRN
  Administered 2012-06-18: 100 mL via INTRAVENOUS

## 2012-06-18 NOTE — Progress Notes (Signed)
SUBJECTIVE:  Still SOB when ambulating but better.  She just had a breathing treatment a little while ago and is currently wheezing.    OBJECTIVE:   Vitals:   Filed Vitals:   06/17/12 2204 06/18/12 0525 06/18/12 0819 06/18/12 0820  BP: 112/55 132/58    Pulse: 57 57    Temp: 98.3 F (36.8 C) 97.6 F (36.4 C)    TempSrc: Oral Oral    Resp: 20 20    Height:      Weight:  135.807 kg (299 lb 6.4 oz)    SpO2: 97% 98% 94% 94%   I&O's:   Intake/Output Summary (Last 24 hours) at 06/18/12 0902 Last data filed at 06/18/12 0825  Gross per 24 hour  Intake   1380 ml  Output   1350 ml  Net     30 ml   TELEMETRY: Reviewed telemetry pt in NSR    PHYSICAL EXAM General: Well developed, well nourished, in no acute distress Lungs:   Scattered wheezes throughout Heart:   HRRR S1 S2 Pulses are 2+ & equal. Abdomen: Bowel sounds are positive, abdomen soft and non-tender without masses Extremities:   No clubbing, cyanosis or edema.  DP +1 Neuro: Alert and oriented X 3. Psych:  Good affect, responds appropriately   LABS: Basic Metabolic Panel:  Basename 06/18/12 0549 06/17/12 0930  NA 138 141  K 4.4 3.6  CL 101 99  CO2 32 33*  GLUCOSE 137* 144*  BUN 20 18  CREATININE 0.76 0.81  CALCIUM 9.5 10.8*  MG -- --  PHOS -- --   CBC:  Basename 06/18/12 0549 06/17/12 0639  WBC 11.9* 14.0*  NEUTROABS -- --  HGB 13.9 14.0  HCT 42.4 43.6  MCV 94.9 94.4  PLT 328 277   Cardiac Enzymes:  Basename 06/16/12 1051 06/16/12 0614 06/15/12 2320  CKTOTAL 128 136 140  CKMB 6.5* 6.6* 6.0*  CKMBINDEX -- -- --  TROPONINI 0.83* 0.80* 1.00*   BNP: No components found with this basename: POCBNP:3 D-Dimer: No results found for this basename: DDIMER:2 in the last 72 hours Hemoglobin A1C:  Basename 06/15/12 2322  HGBA1C 6.8*   Fasting Lipid Panel:  Basename 06/16/12 0614  CHOL 228*  HDL 47  LDLCALC 156*  TRIG 127  CHOLHDL 4.9  LDLDIRECT --   Coag Panel:   Lab Results  Component Value  Date   INR 0.91 06/15/2012    RADIOLOGY: Dg Chest Portable 1 View  06/15/2012  *RADIOLOGY REPORT*  Clinical Data: Shortness of breath, history asthma, COPD, smoking  PORTABLE CHEST - 1 VIEW  Comparison: Portable exam 1559 hours compared to 05/17/2012  Findings: Enlargement of cardiac silhouette with pulmonary vascular congestion. Questionable acute versus chronic interstitial infiltrates in the perihilar regions, greater on the right, potentially representing pulmonary edema. Suboptimal visualization of left base due to underpenetration. No definite pleural effusion or pneumothorax on limited assessment. Endplate spur formation thoracic spine.  IMPRESSION: Enlargement of cardiac silhouette with pulmonary vascular congestion. Questionable perihilar infiltrate/edema.  Original Report Authenticated By: Lollie Marrow, M.D.      ASSESSMENT:  1. Mildly elevated markers are multifactorial and not related to ACS. May have underlying CAD but should be evaluated after the pulmonary status is stable, probably as OP with cath or Nuclear(less favored).  2. Respiratory failure with recurrent wheezing.  Therefore, current wheezing related to intrinsic lung disease and not CHF. Also suspect OSA. She is still wheezing despite a recent nebulizer treatment. 3. Hyperlipidemia  4.  Morbid obesity  5. Acute on chronic diastolic heart failure improved with diuresis   PLAN:   1.  Check D-Dimer - ordered yesterday but not done 2.  Continue Lasix 3.  Outpatient sleep study 4.  Appreciate Pulmonary input - continue Albuterol and Ipratropium/Qvair/O2 5.  Ambulate in hall  Quintella Reichert, MD  06/18/2012  9:02 AM

## 2012-06-18 NOTE — Progress Notes (Signed)
DDimmer Results resulted notified Dr. Mayford Knife, orders received for CT Angio.Osvaldo Human

## 2012-06-18 NOTE — Progress Notes (Signed)
ANTICOAGULATION CONSULT NOTE - Follow Up Consult  Pharmacy Consult for Heparin Indication: chest pain/ACS  Allergies  Allergen Reactions  . Codeine Hives, Itching and Other (See Comments)    "breathing problems"  . Latex Itching    Patient Measurements: Height: 5\' 5"  (165.1 cm) (stated) Weight: 299 lb 6.4 oz (135.807 kg) (scale C) IBW/kg (Calculated) : 57  Heparin Dosing Weight: 91kg  Vital Signs: Temp: 97.6 F (36.4 C) (07/27 0525) Temp src: Oral (07/27 0525) BP: 113/66 mmHg (07/27 0933) Pulse Rate: 62  (07/27 0933)  Labs:  Alvira Philips 06/18/12 0549 06/17/12 1834 06/17/12 0930 06/17/12 0639 06/16/12 1051 06/16/12 0614 06/16/12 0257 06/15/12 2322 06/15/12 2320  HGB 13.9 -- -- 14.0 -- -- -- -- --  HCT 42.4 -- -- 43.6 -- -- 45.1 -- --  PLT 328 -- -- 277 -- -- 305 -- --  APTT -- -- -- -- -- -- -- -- --  LABPROT -- -- -- -- -- -- -- 12.4 --  INR -- -- -- -- -- -- -- 0.91 --  HEPARINUNFRC 0.59 0.33 -- <0.10* -- -- -- -- --  CREATININE 0.76 -- 0.81 -- -- -- 0.69 -- --  CKTOTAL -- -- -- -- 128 136 -- -- 140  CKMB -- -- -- -- 6.5* 6.6* -- -- 6.0*  TROPONINI -- -- -- -- 0.83* 0.80* -- -- 1.00*    Estimated Creatinine Clearance: 109.7 ml/min (by C-G formula based on Cr of 0.76).   Medications:  Heparin 2600 units/hr   Assessment: 56yof on heparin for ACS with positive cardiac enzymes. Heparin level (0.59) is therapeutic x 2. Noted D-dimer and order for CTA - will follow-up results. - H/H and Plts stable - No significant bleeding reported  Goal of Therapy:  Heparin level 0.3-0.7 units/ml Monitor platelets by anticoagulation protocol: Yes   Plan:  1. Continue heparin 2600 units/hr (26 ml/hr) 2. Follow-up AM heparin level and CTA results  Cleon Dew [phone] 06/18/2012,12:00 PM

## 2012-06-19 LAB — CBC
HCT: 42.4 % (ref 36.0–46.0)
Hemoglobin: 13.7 g/dL (ref 12.0–15.0)
MCV: 95.3 fL (ref 78.0–100.0)
RDW: 16.4 % — ABNORMAL HIGH (ref 11.5–15.5)
WBC: 10.4 10*3/uL (ref 4.0–10.5)

## 2012-06-19 LAB — BASIC METABOLIC PANEL
BUN: 12 mg/dL (ref 6–23)
CO2: 27 mEq/L (ref 19–32)
GFR calc non Af Amer: >90 mL/min (ref >90)
Glucose, Bld: 141 mg/dL — ABNORMAL HIGH (ref 70–99)
Potassium: 4.5 mEq/L (ref 3.5–5.1)

## 2012-06-19 LAB — GLUCOSE, CAPILLARY
Glucose-Capillary: 82 mg/dL (ref 70–99)
Glucose-Capillary: 130 mg/dL — ABNORMAL HIGH (ref 70–99)
Glucose-Capillary: 108 mg/dL — ABNORMAL HIGH (ref 70–99)

## 2012-06-19 MED ORDER — HEPARIN (PORCINE) IN NACL 100-0.45 UNIT/ML-% IJ SOLN
2400.0000 [IU]/h | INTRAMUSCULAR | Status: DC
  Administered 2012-06-19: 2400 [IU]/h via INTRAVENOUS
  Filled 2012-06-19 (×2): qty 250

## 2012-06-19 MED ORDER — MOXIFLOXACIN HCL 400 MG PO TABS
400.0000 mg | ORAL_TABLET | Freq: Every day | ORAL | Status: DC
  Administered 2012-06-19: 400 mg via ORAL
  Filled 2012-06-19 (×3): qty 1

## 2012-06-19 MED ORDER — HEPARIN SODIUM (PORCINE) 5000 UNIT/ML IJ SOLN
5000.0000 [IU] | Freq: Three times a day (TID) | INTRAMUSCULAR | Status: DC
  Administered 2012-06-19 – 2012-06-20 (×3): 5000 [IU] via SUBCUTANEOUS
  Filled 2012-06-19 (×6): qty 1

## 2012-06-19 NOTE — Progress Notes (Signed)
SUBJECTIVE:  Complains of cough productive of green sputum  OBJECTIVE:   Vitals:   Filed Vitals:   06/18/12 2159 06/19/12 0651 06/19/12 0756 06/19/12 0759  BP: 114/48 137/69    Pulse: 59 66    Temp: 98.4 F (36.9 C) 98.2 F (36.8 C)    TempSrc: Oral Oral    Resp: 18 18    Height:      Weight:  136.8 kg (301 lb 9.4 oz)    SpO2: 99% 100% 99% 99%   I&O's:   Intake/Output Summary (Last 24 hours) at 06/19/12 1042 Last data filed at 06/19/12 1610  Gross per 24 hour  Intake 1726.8 ml  Output   3450 ml  Net -1723.2 ml   TELEMETRY: Reviewed telemetry pt in NSR     PHYSICAL EXAM General: Well developed, well nourished, in no acute distress Head: Eyes PERRLA, No xanthomas.   Normal cephalic and atramatic  Lungs:   Clear bilaterally to auscultation and percussion. Heart:   HRRR S1 S2 Pulses are 2+ & equal. Abdomen: Bowel sounds are positive, abdomen soft and non-tender without masses  Extremities:   No clubbing, cyanosis or edema.  DP +1 Neuro: Alert and oriented X 3. Psych:  Good affect, responds appropriately   LABS: Basic Metabolic Panel:  Basename 06/19/12 0759 06/18/12 0549  NA 138 138  K 4.5 4.4  CL 102 101  CO2 27 32  GLUCOSE 141* 137*  BUN 12 20  CREATININE 0.67 0.76  CALCIUM 9.6 9.5  MG -- --  PHOS -- --   Liver Function Tests: No results found for this basename: AST:2,ALT:2,ALKPHOS:2,BILITOT:2,PROT:2,ALBUMIN:2 in the last 72 hours No results found for this basename: LIPASE:2,AMYLASE:2 in the last 72 hours CBC:  Basename 06/19/12 0759 06/18/12 0549  WBC 10.4 11.9*  NEUTROABS -- --  HGB 13.7 13.9  HCT 42.4 42.4  MCV 95.3 94.9  PLT 231 328   Cardiac Enzymes:  Basename 06/16/12 1051  CKTOTAL 128  CKMB 6.5*  CKMBINDEX --  TROPONINI 0.83*   BNP: No components found with this basename: POCBNP:3 D-Dimer:  Basename 06/18/12 0924  DDIMER 2.30*   Coag Panel:   Lab Results  Component Value Date   INR 0.91 06/15/2012    RADIOLOGY: Ct Angio  Chest W/cm &/or Wo Cm  06/18/2012  *RADIOLOGY REPORT*  Clinical Data: Shortness of breath and elevated D-dimer.  CT ANGIOGRAPHY CHEST  Technique:  Multidetector CT imaging of the chest using the standard protocol during bolus administration of intravenous contrast. Multiplanar reconstructed images including MIPs were obtained and reviewed to evaluate the vascular anatomy.  Contrast: OMNIPAQUE IOHEXOL 350 MG/ML SOLN  Comparison: Chest CT 04/28/2012.  Findings: The chest wall is unremarkable and stable.  No breast masses, supraclavicular or axillary lymphadenopathy.  The bony thorax is intact.  The heart is borderline enlarged but stable.  No pericardial effusion.  Stable mediastinal and hilar adenopathy since 2006.  The esophagus is grossly normal.  The aorta is normal in caliber.  No dissection.  The pulmonary arteries are enlarged suggesting pulmonary hypertension.  They are well opacified and no filling defects are seen to suggest pulmonary emboli.  Examination of the lung parenchyma demonstrates stable interstitial lung disease cannot exclude superimposed acute alveolitis.  No focal airspace consolidation to suggest pneumonia.  No pleural effusion.  The upper abdomen is unremarkable.  Diffuse fatty infiltration of the liver is again demonstrated along with scattered borderline upper abdominal lymph nodes.  IMPRESSION:  1.  No CT  findings for pulmonary embolism. 2.  Normal thoracic aorta. 3.  Mild pulmonary artery enlargement suggesting pulmonary hypertension. 4.  Stable interstitial lung disease with possible superimposed alveolitis. 5.  Stable mediastinal and hilar lymphadenopathy.  Question history of sarcoidosis.  Original Report Authenticated By: P. Loralie Champagne, M.D.   Dg Chest Portable 1 View  06/15/2012  *RADIOLOGY REPORT*  Clinical Data: Shortness of breath, history asthma, COPD, smoking  PORTABLE CHEST - 1 VIEW  Comparison: Portable exam 1559 hours compared to 05/17/2012  Findings:  Enlargement of cardiac silhouette with pulmonary vascular congestion. Questionable acute versus chronic interstitial infiltrates in the perihilar regions, greater on the right, potentially representing pulmonary edema. Suboptimal visualization of left base due to underpenetration. No definite pleural effusion or pneumothorax on limited assessment. Endplate spur formation thoracic spine.  IMPRESSION: Enlargement of cardiac silhouette with pulmonary vascular congestion. Questionable perihilar infiltrate/edema.  Original Report Authenticated By: Lollie Marrow, M.D.      ASSESSMENT:  1. Mildly elevated markers are multifactorial and not related to ACS. May have underlying CAD but should be evaluated after the pulmonary status is stable, probably as OP with cath or Nuclear(less favored).  2. Respiratory failure with recurrent wheezing. Therefore, current wheezing related to intrinsic lung disease and not CHF. Also suspect OSA. Now coughing up green phlegm.  Chest CT with no PE but did show ? Alveolitis. 3. Hyperlipidemia  4. Morbid obesity  5. Acute on chronic diastolic heart failure improved with diuresis    PLAN:   1.  Will discuss with pulmonary patient's green sputum and findings on chest CT to make sure no other therapy needed at this time. 2.  Continue PO Lasix 3.  D/C IV Heparin gtt   Quintella Reichert, MD  06/19/2012  10:42 AM

## 2012-06-19 NOTE — Progress Notes (Signed)
ANTICOAGULATION CONSULT NOTE - Follow Up Consult  Pharmacy Consult for heparin Indication: chest pain/ACS  Allergies  Allergen Reactions  . Codeine Hives, Itching and Other (See Comments)    "breathing problems"  . Latex Itching    Patient Measurements: Height: 5\' 5"  (165.1 cm) (stated) Weight: 301 lb 9.4 oz (136.8 kg) IBW/kg (Calculated) : 57  Heparin Dosing Weight: 91 kg  Vital Signs: Temp: 98.2 F (36.8 C) (07/28 0651) Temp src: Oral (07/28 0651) BP: 137/69 mmHg (07/28 0651) Pulse Rate: 66  (07/28 0651)  Labs:  Basename 06/19/12 0759 06/18/12 0549 06/17/12 1834 06/17/12 0930 06/17/12 0639 06/16/12 1051  HGB 13.7 13.9 -- -- -- --  HCT 42.4 42.4 -- -- 43.6 --  PLT 231 328 -- -- 277 --  APTT -- -- -- -- -- --  LABPROT -- -- -- -- -- --  INR -- -- -- -- -- --  HEPARINUNFRC 0.91* 0.59 0.33 -- -- --  CREATININE 0.67 0.76 -- 0.81 -- --  CKTOTAL -- -- -- -- -- 128  CKMB -- -- -- -- -- 6.5*  TROPONINI -- -- -- -- -- 0.83*    Estimated Creatinine Clearance: 110.2 ml/min (by C-G formula based on Cr of 0.67).   Medications:  Scheduled:    . ipratropium  0.5 mg Nebulization QID   And  . albuterol  2.5 mg Nebulization QID  . ALPRAZolam  0.25 mg Oral Once  . aspirin EC  325 mg Oral Daily  . atenolol  25 mg Oral Daily  . atorvastatin  20 mg Oral q1800  . beclomethasone  2 puff Inhalation BID  . furosemide  40 mg Oral Daily  . insulin aspart  0-15 Units Subcutaneous TID WC  . omega-3 acid ethyl esters  1 g Oral Daily  . spironolactone  25 mg Oral Daily   Infusions:    . heparin 2,400 Units/hr (06/19/12 0946)  . DISCONTD: heparin 2,600 Units/hr (06/18/12 2313)    Assessment: 56yof on heparin for ACS with positive cardiac enzymes. HL at 07:59 SUPRA-therapeutic at 0.91.  Goal of Therapy:  Heparin level 0.3-0.7 units/ml Monitor platelets by anticoagulation protocol: Yes   Plan:  Decrease heparin gtt to 2400 units/hr F/u 6hr heparin level F/u CBC, s/sx  bleeding   Thank you for the consult.  Tomi Bamberger, PharmD Clinical Pharmacist Pager: [phone] Pharmacy: [phone] 06/19/2012 10:17 AM

## 2012-06-19 NOTE — Progress Notes (Signed)
Name: Margaret [name] MRN: [id number] DOB: June 11, 1956    LOS: 4 Consulting MD Katrinka Blazing Reason for consult: COPD  Cultures:   Antibiotics:  Tests / Events: 6/25: EF 60-65%mild LVH  History of Present Illness:  56 y.o.female with recently diagnosed COPD (F/B CY, last seen 6/25) who presents to the ED with SOB on 7/24. On 7/24 while at work, she ran out of her oxygen and became severely short of breath while trying to get her other bottle. She was out for about 15 minutes. She felt extreme air hunger and became panicked. EMS was called, she responded well to oxygen and breathing treatments. She denied having had chest pain, but did report that she had noticed some increasing SOB over the last few days prior to this event, as well as increased orthopnea and PND. She also reported lower ext edema and increased fullness in her abdomen. Denied  any fevers, chills, or productive cough. She was admitted to cardiologies service, evaluation demonstrated: mildly elevated CEs, and CXR w/ pulmonary edema. She was treated w/ IV lasix and had 5.4 liters of diuresis as of 7/26. As 7/25 she felt like she was heading in right direction, then  In the am reported she was  wheezing more w/ yellow colored sputum production w. Cough. On pulm eval she still feels like her dyspnea is at baseline and she notes substantial  Improvement  after Bronchodilator administration. Because of her on-going  wheeze in spite of what appears to be favorable diuresis Pulmonary was asked to evaluate given concern that her underlying obstructive lung disease may now be her barrier to improvement. Of note she has not been able to take her spiriva and advair due to financial restraints, and that she has been managing on oxygen and scheduled SABA only. Also the PM of 7/25 into the am hours before she developed the progressive wheezing she had been sleeping without her oxygen.  Events-  06/19/12-Called back by Dr Mayford Knife because of  parenchymal changes noted on CT, also new green sputum. Onset today of scant green, non-bloody. No fever. May have had sore throat yesterday. CT reviewed, showing interstitial prominence and adenopathy- possible sarcoid vs scarring.  Review of Systems  Constitutional: No weight loss, gain, night sweats, Fevers, chills, fatigue .  HEENT: No headaches, visual changes, Difficulty swallowing, Tooth/dental problems, or Sore throat,  No sneezing, itching, ear ache, nasal congestion, post nasal drip, no visual complaints CV: No chest pain,+  orthopnea,+ PND, + swelling in lower extremities, - dizziness,- palpitations,- syncope.  GI No heartburn, indigestion, abdominal pain, nausea, vomiting,   Resp: green sputum, mild cough Skin: no rash or itching or icterus GU:  MS: No joint pain or swelling.   Psych: No change in mood or affect. No depression or anxiety.  Neuro: no issues    Past Medical History  Diagnosis Date  . Hypertension   . Hyperlipidemia   . COPD (chronic obstructive pulmonary disease) 04/20/12  . Asthma   . Type II diabetes mellitus   . Anemia   . H/O hiatal hernia   . Osteoarthritis   . Pneumonia 04/2012  . Migraines   . Panic attacks 04/20/12  . Morbid obesity    Past Surgical History  Procedure Date  . Laceration repair 1966    "right upper arm"  . Dilation and curettage of uterus 1990's  . Finger reattachment 1973    "right ring finger"   Prior to Admission medications   Medication  Sig Start Date End Date Taking? Authorizing Provider  albuterol (PROVENTIL HFA;VENTOLIN HFA) 108 (90 BASE) MCG/ACT inhaler Inhale 2 puffs into the lungs every 4 (four) hours as needed. For shortness of breath. 04/30/12  Yes Shanker Levora Dredge, MD  albuterol (PROVENTIL) (2.5 MG/3ML) 0.083% nebulizer solution Take 2.5 mg by nebulization every 6 (six) hours as needed. For shortness of breath   Yes Historical Provider, MD  aspirin 81 MG chewable tablet Chew 81 mg by mouth daily.   Yes Historical  Provider, MD  atenolol (TENORMIN) 50 MG tablet Take 50 mg by mouth daily.   Yes Historical Provider, MD  fish oil-omega-3 fatty acids 1000 MG capsule Take 1 g by mouth daily.   Yes Historical Provider, MD  furosemide (LASIX) 40 MG tablet Take 20-40 mg by mouth 2 (two) times daily. Take 1 tablet in the morning and take one-half of a tablet every night at bedtime.   Yes Historical Provider, MD  metFORMIN (GLUCOPHAGE) 500 MG tablet Take 500 mg by mouth 2 (two) times daily with a meal. 04/30/12 04/30/13 Yes Shanker Levora Dredge, MD   Allergies Allergies  Allergen Reactions  . Codeine Hives, Itching and Other (See Comments)    "breathing problems"  . Latex Itching    Family History Family History  Problem Relation Age of Onset  . Allergies    . Hypertension    . Coronary artery disease Mother 52  . Stroke Father   . COPD Father     Social History  reports that she quit smoking about 1 months ago. Her smoking use included Cigarettes. She has a 8 pack-year smoking history. She has never used smokeless tobacco. She reports that she does not drink alcohol or use illicit drugs.   Vital Signs: BP 137/69  Pulse 66  Temp 98.2 F (36.8 C) (Oral)  Resp 18  Ht 5\' 5"  (1.651 m)  Wt 136.8 kg (301 lb 9.4 oz)  BMI 50.19 kg/m2  SpO2 99%       . DISCONTD: heparin 2,600 Units/hr (06/18/12 2313)  . DISCONTD: heparin Stopped (06/19/12 1100)     Intake/Output Summary (Last 24 hours) at 06/19/12 1237 Last data filed at 06/19/12 1151  Gross per 24 hour  Intake 1506.8 ml  Output   3550 ml  Net -2043.2 ml    Physical Examination: General:  Obese white female, not currently in acute distress. Up in chair, conversational Neuro:  Alert and oriented w/out any focal def HEENT:  [place]. No JVD, no upper airway wheeze Cardiovascular:  rrr Lungs: decreased in bases, unlabored Abdomen:  Obese, non-tender Musculoskeletal:  intact Skin:  LE swelling   Ventilator settings:    Labs and Imaging:   Lab  06/19/12 0759 06/18/12 0549 06/17/12 0930  NA 138 138 141  K 4.5 4.4 3.6  CL 102 101 99  CO2 27 32 33*  BUN 12 20 18   CREATININE 0.67 0.76 0.81  GLUCOSE 141* 137* 144*    Lab 06/19/12 0759 06/18/12 0549 06/17/12 0639  HGB 13.7 13.9 14.0  HCT 42.4 42.4 43.6  WBC 10.4 11.9* 14.0*  PLT 231 328 277    Assessment and Plan: Acute on Chronic Multifactorial dyspnea. Agree that this initially was due to decompensated diastolic heart failure w/ volume overload in setting what is probably underlying decompensated OHS/OSA, but complicated by baseline COPD +/- mild exacerbation vs just sub-optimal treatment regimen. She is not currently having any wheeze, but recently had a breathing treatment. Have called [facility] and  verified the generic med list and both ipratropium and albuterol nebululized are on the $4 list. The Cheapest inhaled steroid is QVar at $135 for monthly supply. Have discussed this to pt and she is agreeable to this plan for short time. We will try to support her via our office (she has an appointment on Aug 6th) with samples as well (there are no generic $4 dollar or inexpensive options for Inhaled steroids).  Recommendation  1) change BD regimen to Albuterol and Ipratropium QID w/PRN albuterol 2) add Qvar (be sure to send her home with this from hospital) 3) continue oxygen 24/7 4) we will see her on Aug 6th, she will have PFTs, 6 minute walk test and appointment w/ Dr Maple Hudson.  5) will defer further evaluation of what is likely OSA to Dr Maple Hudson in follow-up appointments.  6- For Acute bronchitic exacerbation of COPD will add Avelox- Suggest 5 days total. I will see to f/u parenchymal lung disease in office.  Abnormal CEs, HL  Plan: Per cards.    Waymon Budge 06/19/2012, 12:37 PM  Patient likely has undertreated obstructive lung disease.  Adjusted bronchodilators as above, will need f/u with Dr. Maple Hudson for sleep study.  No indication of steroids at this point, will need to go home  with home O2 if not already set up.  PCCM will see again on Monday.  Marland Kitchen  Waymon Budge, M.D. [phone]

## 2012-06-19 NOTE — Progress Notes (Signed)
  ANTICOAGULATION CONSULT NOTE - Follow Up Consult  Pharmacy Consult for Heparin Indication: VTE prophylaxis  Allergies  Allergen Reactions  . Codeine Hives, Itching and Other (See Comments)    "breathing problems"  . Latex Itching    Patient Measurements: Height: 5\' 5"  (165.1 cm) (stated) Weight: 301 lb 9.4 oz (136.8 kg) IBW/kg (Calculated) : 57  Heparin Dosing Weight:   Vital Signs: Temp: 98.2 F (36.8 C) (07/28 0651) Temp src: Oral (07/28 0651) BP: 137/69 mmHg (07/28 0651) Pulse Rate: 66  (07/28 0651)  Labs:  Basename 06/19/12 0759 06/18/12 0549 06/17/12 1834 06/17/12 0930 06/17/12 0639  HGB 13.7 13.9 -- -- --  HCT 42.4 42.4 -- -- 43.6  PLT 231 328 -- -- 277  APTT -- -- -- -- --  LABPROT -- -- -- -- --  INR -- -- -- -- --  HEPARINUNFRC 0.91* 0.59 0.33 -- --  CREATININE 0.67 0.76 -- 0.81 --  CKTOTAL -- -- -- -- --  CKMB -- -- -- -- --  TROPONINI -- -- -- -- --    Estimated Creatinine Clearance: 110.2 ml/min (by C-G formula based on Cr of 0.67).   Medications:  Heparin 2600 units/hr  Assessment: 56yof on heparin drip for positive cardiac enzymes but found not related to ACS. CTA performed 7/27 - no findings for PE. Cardiology has consulted pharmacy to change heparin from treatment to VTE prophylaxis dosing. Heparin drip was discontinued @ 1100. - H/H stable, Plts trending down (near admit level) - No significant bleeding reported   Plan:  1. Heparin 5000 units SQ q8h 2. Discontinue heparin levels 3. Pharmacy will sign off - please reconsult if additional assistance is needed.  Cleon Dew [phone] 06/19/2012,11:49 AM

## 2012-06-20 DIAGNOSIS — I200 Unstable angina: Secondary | ICD-10-CM

## 2012-06-20 LAB — CBC
HCT: 45.2 % (ref 36.0–46.0)
Hemoglobin: 14.1 g/dL (ref 12.0–15.0)
MCH: 30.1 pg (ref 26.0–34.0)
MCHC: 31.2 g/dL (ref 30.0–36.0)
MCV: 96.6 fL (ref 78.0–100.0)
RDW: 16.4 % — ABNORMAL HIGH (ref 11.5–15.5)

## 2012-06-20 LAB — BASIC METABOLIC PANEL
BUN: 11 mg/dL (ref 6–23)
Calcium: 10 mg/dL (ref 8.4–10.5)
Creatinine, Ser: 0.65 mg/dL (ref 0.50–1.10)
GFR calc Af Amer: >90 mL/min (ref >90)
GFR calc non Af Amer: >90 mL/min (ref >90)
Glucose, Bld: 144 mg/dL — ABNORMAL HIGH (ref 70–99)

## 2012-06-20 LAB — GLUCOSE, CAPILLARY: Glucose-Capillary: 135 mg/dL — ABNORMAL HIGH (ref 70–99)

## 2012-06-20 MED ORDER — BECLOMETHASONE DIPROPIONATE 40 MCG/ACT IN AERS: 2.0000 | INHALATION_SPRAY | Freq: Two times a day (BID) | RESPIRATORY_TRACT | Status: DC

## 2012-06-20 MED ORDER — NITROGLYCERIN 0.4 MG SL SUBL: 0.4000 mg | SUBLINGUAL_TABLET | SUBLINGUAL | Status: DC | PRN

## 2012-06-20 MED ORDER — ATORVASTATIN CALCIUM 20 MG PO TABS: 20.0000 mg | ORAL_TABLET | Freq: Every day | ORAL | Status: DC

## 2012-06-20 MED ORDER — MOXIFLOXACIN HCL 400 MG PO TABS: 400.0000 mg | ORAL_TABLET | Freq: Every day | ORAL | Status: DC

## 2012-06-20 MED ORDER — SPIRONOLACTONE 25 MG PO TABS: 25.0000 mg | ORAL_TABLET | Freq: Every day | ORAL | Status: DC

## 2012-06-20 NOTE — Progress Notes (Signed)
Name: Margaret [name] MRN: [id number] DOB: 1956/10/26    LOS: 5 Consulting MD Katrinka Blazing Reason for consult: COPD  Cultures:   Antibiotics:  Tests / Events: 6/25: EF 60-65%mild LVH  History of Present Illness:  56 y.o.female with recently diagnosed COPD (F/B CY, last seen 6/25) who presents to the ED with SOB on 7/24. On 7/24 while at work, she ran out of her oxygen and became severely short of breath while trying to get her other bottle. She was out for about 15 minutes. She felt extreme air hunger and became panicked. EMS was called, she responded well to oxygen and breathing treatments. She denied having had chest pain, but did report that she had noticed some increasing SOB over the last few days prior to this event, as well as increased orthopnea and PND. She also reported lower ext edema and increased fullness in her abdomen. Denied  any fevers, chills, or productive cough. She was admitted to cardiologies service, evaluation demonstrated: mildly elevated CEs, and CXR w/ pulmonary edema. She was treated w/ IV lasix and had 5.4 liters of diuresis as of 7/26. As 7/25 she felt like she was heading in right direction, then  In the am reported she was  wheezing more w/ yellow colored sputum production w. Cough. On pulm eval she still feels like her dyspnea is at baseline and she notes substantial  Improvement  after Bronchodilator administration. Because of her on-going  wheeze in spite of what appears to be favorable diuresis Pulmonary was asked to evaluate given concern that her underlying obstructive lung disease may now be her barrier to improvement. Of note she has not been able to take her spiriva and advair due to financial restraints, and that she has been managing on oxygen and scheduled SABA only. Also the PM of 7/25 into the am hours before she developed the progressive wheezing she had been sleeping without her oxygen.  Events-  06/19/12-Called back by Dr Mayford Knife because of  parenchymal changes noted on CT, also new green sputum. Onset today of scant green, non-bloody. No fever. May have had sore throat yesterday. CT reviewed, showing interstitial prominence and adenopathy- possible sarcoid vs scarring.  Review of Systems  Constitutional: No weight loss, gain, night sweats, Fevers, chills, fatigue .  HEENT: No headaches, visual changes, Difficulty swallowing, Tooth/dental problems, or Sore throat,  No sneezing, itching, ear ache, nasal congestion, post nasal drip, no visual complaints CV: No chest pain,+  orthopnea,+ PND, + swelling in lower extremities, - dizziness,- palpitations,- syncope.  GI No heartburn, indigestion, abdominal pain, nausea, vomiting,   Resp: green sputum, mild cough Skin: no rash or itching or icterus GU:  MS: No joint pain or swelling.   Psych: No change in mood or affect. No depression or anxiety.  Neuro: no issues    Past Medical History  Diagnosis Date  . Hypertension   . Hyperlipidemia   . COPD (chronic obstructive pulmonary disease) 04/20/12  . Asthma   . Type II diabetes mellitus   . Anemia   . H/O hiatal hernia   . Osteoarthritis   . Pneumonia 04/2012  . Migraines   . Panic attacks 04/20/12  . Morbid obesity    Past Surgical History  Procedure Date  . Laceration repair 1966    "right upper arm"  . Dilation and curettage of uterus 1990's  . Finger reattachment 1973    "right ring finger"   Prior to Admission medications   Medication  Sig Start Date End Date Taking? Authorizing Provider  albuterol (PROVENTIL HFA;VENTOLIN HFA) 108 (90 BASE) MCG/ACT inhaler Inhale 2 puffs into the lungs every 4 (four) hours as needed. For shortness of breath. 04/30/12  Yes Shanker Levora Dredge, MD  albuterol (PROVENTIL) (2.5 MG/3ML) 0.083% nebulizer solution Take 2.5 mg by nebulization every 6 (six) hours as needed. For shortness of breath   Yes Historical Provider, MD  aspirin 81 MG chewable tablet Chew 81 mg by mouth daily.   Yes Historical  Provider, MD  atenolol (TENORMIN) 50 MG tablet Take 50 mg by mouth daily.   Yes Historical Provider, MD  fish oil-omega-3 fatty acids 1000 MG capsule Take 1 g by mouth daily.   Yes Historical Provider, MD  furosemide (LASIX) 40 MG tablet Take 20-40 mg by mouth 2 (two) times daily. Take 1 tablet in the morning and take one-half of a tablet every night at bedtime.   Yes Historical Provider, MD  metFORMIN (GLUCOPHAGE) 500 MG tablet Take 500 mg by mouth 2 (two) times daily with a meal. 04/30/12 04/30/13 Yes Shanker Levora Dredge, MD   Allergies Allergies  Allergen Reactions  . Codeine Hives, Itching and Other (See Comments)    "breathing problems"  . Latex Itching    Family History Family History  Problem Relation Age of Onset  . Allergies    . Hypertension    . Coronary artery disease Mother 26  . Stroke Father   . COPD Father     Social History  reports that she quit smoking about 2 months ago. Her smoking use included Cigarettes. She has a 8 pack-year smoking history. She has never used smokeless tobacco. She reports that she does not drink alcohol or use illicit drugs.   Vital Signs: BP 128/68  Pulse 70  Temp 98 F (36.7 C) (Oral)  Resp 24  Ht 5\' 5"  (1.651 m)  Wt 135.7 kg (299 lb 2.6 oz)  BMI 49.78 kg/m2  SpO2 90%       . DISCONTD: heparin Stopped (06/19/12 1100)     Intake/Output Summary (Last 24 hours) at 06/20/12 0959 Last data filed at 06/20/12 0744  Gross per 24 hour  Intake    600 ml  Output   2900 ml  Net  -2300 ml    Physical Examination: General:  Obese white female, not currently in acute distress. Up in chair, conversational Neuro:  Alert and oriented w/out any focal def HEENT:  [place]. No JVD, no upper airway wheeze Cardiovascular:  rrr Lungs: decreased in bases, unlabored Abdomen:  Obese, non-tender Musculoskeletal:  intact Skin:  LE swelling   Ventilator settings:    Labs and Imaging:   Lab 06/20/12 0635 06/19/12 0759 06/18/12 0549  NA 138 138  138  K 4.4 4.5 4.4  CL 101 102 101  CO2 28 27 32  BUN 11 12 20   CREATININE 0.65 0.67 0.76  GLUCOSE 144* 141* 137*    Lab 06/20/12 0635 06/19/12 0759 06/18/12 0549  HGB 14.1 13.7 13.9  HCT 45.2 42.4 42.4  WBC 7.4 10.4 11.9*  PLT 248 231 328    Assessment and Plan: Acute on Chronic Multifactorial dyspnea. Agree that this initially was due to decompensated diastolic heart failure w/ volume overload in setting what is probably underlying decompensated OHS/OSA, but complicated by baseline COPD +/- mild exacerbation vs just sub-optimal treatment regimen. She is not currently having any wheeze, but recently had a breathing treatment. Have called [facility] and verified the generic med  list and both ipratropium and albuterol nebululized are on the $4 list. The Cheapest inhaled steroid is QVar at $135 for monthly supply. Have discussed this to pt and she is agreeable to this plan for short time. We will try to support her via our office (she has an appointment on Aug 6th) with samples as well (there are no generic $4 dollar or inexpensive options for Inhaled steroids).  Recommendation  1) change BD regimen to Albuterol and Ipratropium QID w/PRN albuterol 2) add Qvar (be sure to send her home with this from hospital) 3) continue oxygen 24/7 4) we will see her on Aug 6th, she will have PFTs, 6 minute walk test and appointment w/ Dr Maple Hudson.  5) will defer further evaluation of what is likely OSA to Dr Maple Hudson in follow-up appointments.  6- For Acute bronchitic exacerbation of COPD will add Avelox- Suggest 5 days total. Will see to f/u parenchymal lung disease in office by Dr. Maple Hudson.  Abnormal CEs, HL  Plan: Per cards.   Koren Bound, M.D. [phone]

## 2012-06-20 NOTE — Discharge Summary (Signed)
Patient ID: Margaret [name] MRN: [id number] DOB/AGE: 1956-01-10 56 y.o.  Admit date: 06/15/2012 Discharge date: 06/20/2012  Primary Discharge Diagnosis Diastolic heart failure Secondary Discharge Diagnosis: Bronchitis, morbid obesity, fluid overload; sleep apnea,  hyperlipidemia  Significant Diagnostic Studies: PCXR: vascular congestion  Consults: pulmonary/intensive care  Hospital Course: The patient was admitted with severe shortness of breath.  She was found to be fluid overloaded.  She was treated with diuretics and underwent a significant diuresis.  Her shortness of breath improved but she continued to have wheezing and some shortness of breath.  A pulmonary consult was obtained.  It was thought that she required more outpatient inhalers.  She was started on a steroid inhaler.  Cost was an issue for her to stay on this medicine.  There were no generic steroid inhalers.  Pulmonary felt that they could give her sample inhalers from their office.  She was treated with additional diuretics including spironolactone as well.  She had a mild troponin leak which was thought to be due to hypoxia as she had not used her oxygen at home.  She states that now, she does have oxygen at home.  She uses this based on her activity level.  She feels much better today on the day of discharge.  She was started on Avelox yesterday to treat bronchitis and has felt a significant improvement.  She still has some mild lower extremity swelling.  She has suspected sleep apnea and likely some pulmonary hypertension which will be evaluated as an outpatient.  She was also started on atorvastatin for her hyperlipidemia.  The plan was for her to have an outpatient ischemia evaluation by either nuclear stress test or cardiac catheterization, once she has recovered from a pulmonary standpoint.   Discharge Exam: Blood pressure 128/68, pulse 70, temperature 98 F (36.7 C), temperature source Oral, resp. rate 24, height 5\' 5"   (1.651 m), weight 135.7 kg (299 lb 2.6 oz), SpO2 90.00%.  Oran/AT RRR, S1 S2 Scant wheezing bilaterally Obese 1+ bilateral pitting lower extremity edema Labs:   Lab Results  Component Value Date   WBC 7.4 06/20/2012   HGB 14.1 06/20/2012   HCT 45.2 06/20/2012   MCV 96.6 06/20/2012   PLT 248 06/20/2012    Lab 06/20/12 0635  NA 138  K 4.4  CL 101  CO2 28  BUN 11  CREATININE 0.65  CALCIUM 10.0  PROT --  BILITOT --  ALKPHOS --  ALT --  AST --  GLUCOSE 144*   Lab Results  Component Value Date   CKTOTAL 128 06/16/2012   CKMB 6.5* 06/16/2012   TROPONINI 0.83* 06/16/2012    Lab Results  Component Value Date   CHOL 228* 06/16/2012   Lab Results  Component Value Date   HDL 47 06/16/2012   Lab Results  Component Value Date   LDLCALC 156* 06/16/2012   Lab Results  Component Value Date   TRIG 127 06/16/2012   Lab Results  Component Value Date   CHOLHDL 4.9 06/16/2012   No results found for this basename: LDLDIRECT      Radiology: As above EKG: Normal sinus rhythm, right atrial enlargement, nonspecific ST, T-wave changes  Followup with Dr. Verdis Prime in one week  FOLLOW UP PLANS AND APPOINTMENTS Discharge Orders    Future Appointments: Provider: Department: Dept Phone: Center:   06/28/2012 11:30 AM [facility] 6 Minute Walk [facility] [phone] None   06/28/2012 12:00 PM [facility] Pft Room [facility] [phone] None   06/28/2012  1:30 PM Waymon Budge, MD [facility] [phone] None     Medication List  As of 06/20/2012 11:26 AM   STOP taking these medications         albuterol (2.5 MG/3ML) 0.083% nebulizer solution         TAKE these medications         albuterol 108 (90 BASE) MCG/ACT inhaler   Commonly known as: PROVENTIL HFA;VENTOLIN HFA   Inhale 2 puffs into the lungs every 4 (four) hours as needed. For shortness of breath.      aspirin 81 MG chewable tablet   Chew 81 mg by mouth daily.      atenolol 50 MG tablet   Commonly known  as: TENORMIN   Take 50 mg by mouth daily.      atorvastatin 20 MG tablet   Commonly known as: LIPITOR   Take 1 tablet (20 mg total) by mouth daily at 6 PM.      beclomethasone 40 MCG/ACT inhaler   Commonly known as: QVAR   Inhale 2 puffs into the lungs 2 (two) times daily.      fish oil-omega-3 fatty acids 1000 MG capsule   Take 1 g by mouth daily.      furosemide 40 MG tablet   Commonly known as: LASIX   Take 20-40 mg by mouth 2 (two) times daily. Take 1 tablet in the morning and take one-half of a tablet every night at bedtime.      metFORMIN 500 MG tablet   Commonly known as: GLUCOPHAGE   Take 500 mg by mouth 2 (two) times daily with a meal.      moxifloxacin 400 MG tablet   Commonly known as: AVELOX   Take 1 tablet (400 mg total) by mouth daily at 8 pm.      nitroGLYCERIN 0.4 MG SL tablet   Commonly known as: NITROSTAT   Place 1 tablet (0.4 mg total) under the tongue every 5 (five) minutes x 3 doses as needed for chest pain.      spironolactone 25 MG tablet   Commonly known as: ALDACTONE   Take 1 tablet (25 mg total) by mouth daily.           Follow-up Information    Follow up with Waymon Budge, MD on 06/28/2012. ([facility])    Contact information:   [address] [employer] [place] [address] [phone]          BRING ALL MEDICATIONS WITH YOU TO FOLLOW UP APPOINTMENTS  Time spent with patient to include physician time: 25 minutes Signed: Tanecia Mccay S. 06/20/2012, 11:26 AM

## 2012-06-20 NOTE — Progress Notes (Signed)
Nursing Note: Pt to be discharged per MD's order. IV and telemetry discontinued. Pt has received all discharge information and verbalized understanding. Will escort pt to car safely upon discharge. Jaylie Neaves Scientist, clinical (histocompatibility and immunogenetics).

## 2012-06-20 NOTE — Progress Notes (Signed)
Pt given all discharge instructions including home meds, follow up appointments, and when to call MD. Pt discharge per wheelchair by RN, accompanied by family member.

## 2012-06-21 ENCOUNTER — Telehealth: Payer: Self-pay | Admitting: Internal Medicine

## 2012-06-21 NOTE — Telephone Encounter (Signed)
Pt wanted to clarify that the medication that the pharmacy had was the same on her list. She states on her discharge instruct she is to pick-up moxifloxacin, but the pharmacy has Avelox. I advised this is the same medication. Pt states understanding. Carron Curie, CMA

## 2012-06-28 ENCOUNTER — Other Ambulatory Visit: Payer: Self-pay

## 2012-06-28 ENCOUNTER — Ambulatory Visit (INDEPENDENT_AMBULATORY_CARE_PROVIDER_SITE_OTHER): Payer: Self-pay | Admitting: Internal Medicine

## 2012-06-28 ENCOUNTER — Encounter: Payer: Self-pay | Admitting: Internal Medicine

## 2012-06-28 VITALS — BP 132/60 | HR 70 | Ht 65.0 in | Wt 296.6 lb

## 2012-06-28 DIAGNOSIS — D869 Sarcoidosis, unspecified: Secondary | ICD-10-CM

## 2012-06-28 DIAGNOSIS — J449 Chronic obstructive pulmonary disease, unspecified: Secondary | ICD-10-CM

## 2012-06-28 DIAGNOSIS — F17200 Nicotine dependence, unspecified, uncomplicated: Secondary | ICD-10-CM

## 2012-06-28 LAB — PULMONARY FUNCTION TEST

## 2012-06-28 MED ORDER — ALBUTEROL SULFATE (2.5 MG/3ML) 0.083% IN NEBU: 2.5000 mg | INHALATION_SOLUTION | Freq: Four times a day (QID) | RESPIRATORY_TRACT | Status: DC | PRN

## 2012-06-28 NOTE — Progress Notes (Signed)
05/17/12- 74 yoF former smoker seen for a post-hospital visit to establish pulmonary followup. PCP Dr Arrie Aran She was hospitalized twice this summer, most recently June 5 through June 8 at [facility], for healthcare associated pneumonia and COPD with exacerbation including some hemoptysis, congestive heart failure, hypertension, diabetes. She had had several episodes of bronchitis during the past winter. She had smoked for 40 years, up to one and a half packs per day, before tapering down and quitting one month ago. She was discharged on home oxygen 2 L/Advanced. She denies prior history of pneumonia. She was given pneumonia vaccine in hospital and has had flu vaccine. Since discharge she has finished a prednisone taper. She can't afford either Spiriva or Advair but says Dr. Jearl Klinefelter office is working for  [employer]. 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 In the past she hasn't recognized triggers to include lawnmowing, trucks with hay- sneezing, cough, watering eyes and chest tightness. She reports history of hypertension and congestive heart failure, angina, irregular heart rhythm but not myocardial infarction.  06/28/12- 48 yoF former smoker followed for COPD. PCP Dr Arrie Aran Not using O2 unless needed; review and PFT results with patient. Hosp F/U- 06/12/2012- 06/20/2012/Dr.Varanasi/diastolic heart failure, fluid overload, bronchitis, sleep apnea, hyperlipidemia. She was discharged with the expectation she could get sample inhalers from this office, but those are in very limited supply. Today she says she is "fine", using oxygen for sleep and as needed at 2 L/Advanced. Uses her nebulizer 3 times daily with rare use of her rescue inhaler. Can't afford steroid inhaler or Spiriva. CTchest 06/18/12- IMPRESSION:  1. No CT findings for pulmonary embolism.  2. Normal thoracic aorta.  3. Mild pulmonary  artery enlargement suggesting pulmonary  hypertension.  4. Stable interstitial lung disease with possible superimposed  alveolitis.  5. Stable mediastinal and hilar lymphadenopathy. Question history  of sarcoidosis.  Original Report Authenticated By: P. Loralie Champagne, M.D.  6 Minute Walk Test- 06/28/12- on room air-90%, 89%, 97%, 264 m. Slow pace and labored breathing by end of test. PFT -06/28/2012-mild obstructive airways disease with response to bronchodilator, mild restriction, diffusion moderately reduced. FEV1/FVC 70%, TLC 71%, and DLCO 46%. COPD assessment test (CAT) 10/40  ROS-see HPI Constitutional:   No-   weight loss, night sweats, fevers, chills, fatigue, lassitude. HEENT:   No-  headaches, difficulty swallowing, tooth/dental problems, sore throat,       No-  sneezing, itching, ear ache, nasal congestion, post nasal drip,  CV:  No-   chest pain, orthopnea, PND, +swelling in lower extremities, No-anasarca,  dizziness, palpitations Resp: +  shortness of breath with exertion or at rest.              No-   productive cough,  + non-productive cough,  No- coughing up of blood.              No-   change in color of mucus.  No- wheezing.   Skin: No-   rash or lesions. GI:  No-   heartburn, indigestion, abdominal pain, nausea, vomiting,  GU:  MS:  No-   joint pain or swelling.  . Neuro-     nothing unusual Psych:  No- change in mood or affect. No depression or anxiety.  No memory loss.  OBJ- Physical Exam BP 132/60  Pulse 70  Ht 5\' 5"  (1.651 m)  Wt 296 lb  9.6 oz (134.537 kg)  BMI 49.36 kg/m2  SpO2 93%  General- Alert, Oriented, Affect-appropriate, Distress- none acute, obese, 2 L O2 Skin- rash-none, lesions- none, excoriation- none Lymphadenopathy- none Head- atraumatic            Eyes- Gross vision intact, PERRLA, conjunctivae and secretions clear            Ears- Hearing, canals-normal            Nose- Clear, no-Septal dev, mucus, polyps, erosion, perforation              Throat- Mallampati II , mucosa clear , drainage- none, tonsils- atrophic Neck- flexible , trachea midline, no stridor , thyroid nl, carotid no bruit Chest - symmetrical excursion , unlabored           Heart/CV- RRR , no murmur , no gallop  , no rub, nl s1 s2                           - JVD- none , edema-none, +stasis changes, varices- none           Lung- distant but clear to P&A, wheeze- none, cough- none , dullness-none, rub- none           Chest wall-  Abd-  Br/ Gen/ Rectal- Not done, not indicated Extrem- cyanosis- none, clubbing, none, atrophy- none, strength- nl. Negative Homan's Neuro- grossly intact to observation

## 2012-06-28 NOTE — Patient Instructions (Addendum)
Sample Qvar 40  Order- lab- ACE level  Dx sarcoid(?)

## 2012-06-28 NOTE — Progress Notes (Signed)
PFT done today. 

## 2012-06-29 LAB — ANGIOTENSIN CONVERTING ENZYME: Angiotensin-Converting Enzyme: 40 U/L (ref 8–52)

## 2012-06-30 ENCOUNTER — Telehealth: Payer: Self-pay | Admitting: Internal Medicine

## 2012-06-30 NOTE — Progress Notes (Signed)
Quick Note:  LMTCB ______ 

## 2012-06-30 NOTE — Telephone Encounter (Signed)
Spoke with patient aware of lab results

## 2012-06-30 NOTE — Progress Notes (Signed)
Quick Note:  Pt aware of results. ______ 

## 2012-07-03 ENCOUNTER — Encounter: Payer: Self-pay | Admitting: Internal Medicine

## 2012-07-03 NOTE — Progress Notes (Signed)
Documentation for 6 minute walk test 

## 2012-07-03 NOTE — Assessment & Plan Note (Signed)
PFTs indicate less obstructive airways disease than expected. She does desaturate with exertion and does need her oxygen. Diastolic heart failure, obesity hypoventilation and deconditioning also contribute substantially to her exercise limitation. She has some potential for response to bronchodilator but is significantly limited financially. There was suggestion of interstitial disease on imaging at the hospital. We will check ACE level to exclude sarcoid.

## 2012-07-03 NOTE — Assessment & Plan Note (Signed)
She is encouraged to stay off completely. 

## 2012-07-28 ENCOUNTER — Encounter (HOSPITAL_COMMUNITY)
Admission: RE | Admit: 2012-07-28 | Discharge: 2012-07-28 | Disposition: A | Discharge status: Home / Self Care | Payer: Self-pay | Source: Ambulatory Visit | Attending: Internal Medicine | Admitting: Internal Medicine

## 2012-07-28 DIAGNOSIS — F17200 Nicotine dependence, unspecified, uncomplicated: Secondary | ICD-10-CM | POA: Insufficient documentation

## 2012-07-28 DIAGNOSIS — J4489 Other specified chronic obstructive pulmonary disease: Secondary | ICD-10-CM | POA: Insufficient documentation

## 2012-07-28 DIAGNOSIS — J449 Chronic obstructive pulmonary disease, unspecified: Secondary | ICD-10-CM | POA: Insufficient documentation

## 2012-07-28 DIAGNOSIS — I5033 Acute on chronic diastolic (congestive) heart failure: Secondary | ICD-10-CM | POA: Insufficient documentation

## 2012-07-28 DIAGNOSIS — Z5189 Encounter for other specified aftercare: Secondary | ICD-10-CM | POA: Insufficient documentation

## 2012-07-28 NOTE — Progress Notes (Signed)
First day of exercise in Pulmonary Rehab Maintenance for Margaret [name].  Orientation to equipment use and safety, RPE and Dyspnea scale, hand hygiene and safety in the department.  A 6 min walk test was doe.  Oxygen saturation ranged from 88% on RA to 93%.  We will try exercise with rest breaks and PLB before starting exercise to hopefully maintain sats at a higher level.  She attended class today on PLB and diaphragmatic breathing.  She is very anxious to get started and make lifestyle changes.

## 2012-07-29 ENCOUNTER — Telehealth: Payer: Self-pay | Admitting: Internal Medicine

## 2012-07-29 NOTE — Telephone Encounter (Signed)
Called and spoke with pt and she stated that her advair came in today from patient assistance and she wanted to know if she needs to do the qvar and the advair, or should she finish the qvar and then start the advair.  CY please advise. Thanks  Allergies  Allergen Reactions  . Codeine Hives, Itching and Other (See Comments)    "breathing problems"  . Latex Itching

## 2012-07-29 NOTE — Telephone Encounter (Signed)
lmomtcb  

## 2012-07-29 NOTE — Telephone Encounter (Signed)
Called and spoke with pt and she is aware of CY recs to start on the advair and stop the qvar.  Nothing further is needed.

## 2012-07-29 NOTE — Telephone Encounter (Signed)
Patient returning call--pls call [phone] and ask for Margaret [name] (This is the name she goes by)

## 2012-07-29 NOTE — Telephone Encounter (Signed)
Per CY-just switch now from QVAR to ADVAIR.

## 2012-07-29 NOTE — Telephone Encounter (Signed)
Patient returning call.

## 2012-07-29 NOTE — Telephone Encounter (Signed)
lmomtcb x1--don't see advair on pt med list--was this PA medication? Was given a sampe from her ov 06/28/12 of QVAR

## 2012-08-02 ENCOUNTER — Encounter (HOSPITAL_COMMUNITY)
Admission: RE | Admit: 2012-08-02 | Discharge: 2012-08-02 | Disposition: A | Discharge status: Home / Self Care | Payer: Self-pay | Source: Ambulatory Visit | Attending: Internal Medicine | Admitting: Internal Medicine

## 2012-08-04 ENCOUNTER — Encounter (HOSPITAL_COMMUNITY): Payer: Self-pay

## 2012-08-09 ENCOUNTER — Encounter (HOSPITAL_COMMUNITY)
Admission: RE | Admit: 2012-08-09 | Discharge: 2012-08-09 | Disposition: A | Discharge status: Home / Self Care | Payer: Self-pay | Source: Ambulatory Visit | Attending: Internal Medicine | Admitting: Internal Medicine

## 2012-08-11 ENCOUNTER — Encounter (HOSPITAL_COMMUNITY)
Admission: RE | Admit: 2012-08-11 | Discharge: 2012-08-11 | Disposition: A | Discharge status: Home / Self Care | Payer: Self-pay | Source: Ambulatory Visit | Attending: Internal Medicine | Admitting: Internal Medicine

## 2012-08-16 ENCOUNTER — Encounter (HOSPITAL_COMMUNITY)
Admission: RE | Admit: 2012-08-16 | Discharge: 2012-08-16 | Disposition: A | Discharge status: Home / Self Care | Payer: Self-pay | Source: Ambulatory Visit | Attending: Internal Medicine | Admitting: Internal Medicine

## 2012-08-16 NOTE — Progress Notes (Signed)
Margaret [name] 56 y.o. female             Pulmonary Rehab Nutrition Screen  Please answer the following questions:             YES  NO    Do you live in a nursing home?  X   Do you eat out more than 3 times per week?   X  If yes, how many times per week do you eat out? 3-5  Do you have food allergies?   X If yes, what are you allergic to?  Have you gained or lost more than 10 lbs without trying?               X If yes, how much weight have you  lost or gained?  lbs over  weeks/mo   Do you want to lose weight?    X  If yes, what is a goal weight or amount of weight you would like to lose? 150 lbs  Do you eat alone most of the time?  X    Do you eat less than 2 meals/day?  X If yes, how many meals do you eat?  Do you use canned and convenience food? X    Do you use a salt shaker? X    Do you drink more than 3 alcoholic drinks/day?  X If yes, how many drinks per day?  Are you having trouble with constipation? *  X If yes, what are you doing to help relieve constipation?  Do you have financial difficulties with buying food? * X    Do you usually need help with grocery shopping or with cooking? *  X   Do you have a poor appetite? *                                       X   Do you have trouble chewing/ swallowing? *   X   Do you take vitamin and mineral or herbal supplements? *  X If yes, what kind of supplements do you currently take?    Ht: 65" Ht Readings from Last 1 Encounters:  06/28/12 5\' 5"  (1.651 m)    Wt:   295.7 lb (134.4 kg) Wt Readings from Last 3 Encounters:  06/28/12 296 lb 9.6 oz (134.537 kg)  06/20/12 299 lb 2.6 oz (135.7 kg)  05/17/12 288 lb 6.4 oz (130.817 kg)    BMI: 49.3  56.6%body fat                       Rate Your Plate Score: 44  Diagnosis: COPD Past Medical History  Diagnosis Date  . Hypertension   . Hyperlipidemia   . COPD (chronic obstructive pulmonary disease) 04/20/12  . Asthma   . Type II diabetes mellitus   . Anemia   . H/O hiatal hernia   .  Osteoarthritis   . Pneumonia 04/2012  . Migraines   . Panic attacks 04/20/12  . Morbid obesity      Meds reviewed: Metformin, Fish oil    Wt goal: 272-284 lb ( 123.5-128.9 kg) Current tobacco use? No     Pt quit tobacco use on 04/20/12 Food/Drug Interaction? No Labs:  Lipid Panel     Component Value Date/Time   CHOL 228* 06/16/2012 0614   TRIG 127 06/16/2012 0614   HDL 47 06/16/2012  1610   CHOLHDL 4.9 06/16/2012 0614   VLDL 25 06/16/2012 0614   LDLCALC 156* 06/16/2012 0614    Lab Results  Component Value Date   HGBA1C 6.8* 06/15/2012    Estimated Daily Nutrition Needs for: ? wt loss  1700-2200 Kcal, 110-130 gm protein, sodium less than 1500 mg, Grams of CHO 175-250  Nutrition Risk Level: Moderate

## 2012-08-18 ENCOUNTER — Encounter (HOSPITAL_COMMUNITY)
Admission: RE | Admit: 2012-08-18 | Discharge: 2012-08-18 | Disposition: A | Discharge status: Home / Self Care | Payer: Self-pay | Source: Ambulatory Visit | Attending: Internal Medicine | Admitting: Internal Medicine

## 2012-08-23 ENCOUNTER — Encounter (HOSPITAL_COMMUNITY)
Admission: RE | Admit: 2012-08-23 | Discharge: 2012-08-23 | Disposition: A | Discharge status: Home / Self Care | Payer: Self-pay | Source: Ambulatory Visit | Attending: Internal Medicine | Admitting: Internal Medicine

## 2012-08-23 DIAGNOSIS — Z5189 Encounter for other specified aftercare: Secondary | ICD-10-CM | POA: Insufficient documentation

## 2012-08-23 DIAGNOSIS — J4489 Other specified chronic obstructive pulmonary disease: Secondary | ICD-10-CM | POA: Insufficient documentation

## 2012-08-23 DIAGNOSIS — I5033 Acute on chronic diastolic (congestive) heart failure: Secondary | ICD-10-CM | POA: Insufficient documentation

## 2012-08-23 DIAGNOSIS — F17200 Nicotine dependence, unspecified, uncomplicated: Secondary | ICD-10-CM | POA: Insufficient documentation

## 2012-08-23 DIAGNOSIS — J449 Chronic obstructive pulmonary disease, unspecified: Secondary | ICD-10-CM | POA: Insufficient documentation

## 2012-08-25 ENCOUNTER — Encounter (HOSPITAL_COMMUNITY): Payer: Self-pay

## 2012-08-30 ENCOUNTER — Encounter (HOSPITAL_COMMUNITY)
Admission: RE | Admit: 2012-08-30 | Discharge: 2012-08-30 | Disposition: A | Discharge status: Home / Self Care | Payer: Self-pay | Source: Ambulatory Visit | Attending: Internal Medicine | Admitting: Internal Medicine

## 2012-09-01 ENCOUNTER — Encounter (HOSPITAL_COMMUNITY)
Admission: RE | Admit: 2012-09-01 | Discharge: 2012-09-01 | Disposition: A | Discharge status: Home / Self Care | Payer: Self-pay | Source: Ambulatory Visit | Attending: Internal Medicine | Admitting: Internal Medicine

## 2012-09-06 ENCOUNTER — Encounter (HOSPITAL_COMMUNITY)
Admission: RE | Admit: 2012-09-06 | Discharge: 2012-09-06 | Disposition: A | Discharge status: Home / Self Care | Payer: Self-pay | Source: Ambulatory Visit | Attending: Internal Medicine | Admitting: Internal Medicine

## 2012-09-08 ENCOUNTER — Encounter (HOSPITAL_COMMUNITY)
Admission: RE | Admit: 2012-09-08 | Discharge: 2012-09-08 | Disposition: A | Discharge status: Home / Self Care | Payer: Self-pay | Source: Ambulatory Visit | Attending: Internal Medicine | Admitting: Internal Medicine

## 2012-09-13 ENCOUNTER — Encounter (HOSPITAL_COMMUNITY)
Admission: RE | Admit: 2012-09-13 | Discharge: 2012-09-13 | Disposition: A | Discharge status: Home / Self Care | Payer: Self-pay | Source: Ambulatory Visit | Attending: Internal Medicine | Admitting: Internal Medicine

## 2012-09-13 NOTE — Progress Notes (Signed)
Exit heart rate noted at 50 today.  Patient asymptomatic.  Blood pressure 108/62.  Margaret [name] says felt fatigued over the weekend.  Exit blood pressure 108/62. Patient placed on the Zoll.  ECG tracing showed sinus brady 50-53.Will fax exercise flow sheets to Dr. Michaelle Copas office for review.

## 2012-09-15 ENCOUNTER — Encounter (HOSPITAL_COMMUNITY): Payer: Self-pay

## 2012-09-20 ENCOUNTER — Encounter (HOSPITAL_COMMUNITY)
Admission: RE | Admit: 2012-09-20 | Discharge: 2012-09-20 | Disposition: A | Discharge status: Home / Self Care | Payer: Self-pay | Source: Ambulatory Visit | Attending: Internal Medicine | Admitting: Internal Medicine

## 2012-09-20 NOTE — Progress Notes (Addendum)
Margaret [name] 56 y.o. female  Nutrition Education Consult for DM education. Spoke with pt. Pt recently dx with DM. Last A1c indicates blood glucose well controlled. Per discussion with pt, pt did not know "much about DM." Pt educated re: DM basics (e.g. sources of carbohydrates and how carbs affect CBG's). Pt did not regularly check her CBG's until she started the Pulmonary Maintenance class. Pt has started checking her CBG's "at all different times of the day." normal fasting and post-prandial CBG ranges reviewed. The role Metformin plays in controlling CBG's discussed. Pt's Rate Your Plate results reviewed. Pt educated on ways to gradually change her diet to make healthier choices. Pt expressed understanding of the information reviewed. Continue client-centered nutrition education by RD as part of interdisciplinary care.    Nutrition Diagnosis   Food-and nutrition-related knowledge deficit related to lack of exposure to information as related to diagnosis of: ? COPD ? DM (A1c 6.8)    Obesity related to excessive energy intake as evidenced by a BMI of 49.3  Nutrition RX/ Estimated Daily Nutrition Needs for: wt loss  1700-2200 Kcal, 110-120 gm protein, <1500 mg sodium, 250 gm CHO   Nutrition Intervention   Pt's individual nutrition plan reviewed with pt.   Benefits of adopting healthier eating habits discussed when pt Rate Your Plate reviewed.   Pt to attend the  ? Diabetes Blitz class   Pt given handouts for: ? DM ? 5-day, 1800 kcal menu ideas   Continue client-centered nutrition education by RD, as part of interdisciplinary care. Goal(s)   Pt able to name foods that affect blood glucose    Pt to identify food quantities necessary to achieve: ? wt loss to a goal wt of 272-290 lb (123.6-131.8 kg) at graduation from cardiac rehab.    Pt to describe the benefit of including fruits, vegetables, whole grains, and low-fat dairy products in a healthy meal plan. Monitor and Evaluate progress  toward nutrition goal with team. Nutrition Risk: Moderate

## 2012-09-22 ENCOUNTER — Encounter (HOSPITAL_COMMUNITY): Payer: Self-pay

## 2012-09-27 ENCOUNTER — Encounter (HOSPITAL_COMMUNITY): Payer: Self-pay

## 2012-09-27 DIAGNOSIS — Z5189 Encounter for other specified aftercare: Secondary | ICD-10-CM | POA: Insufficient documentation

## 2012-09-27 DIAGNOSIS — I5033 Acute on chronic diastolic (congestive) heart failure: Secondary | ICD-10-CM | POA: Insufficient documentation

## 2012-09-27 DIAGNOSIS — J4489 Other specified chronic obstructive pulmonary disease: Secondary | ICD-10-CM | POA: Insufficient documentation

## 2012-09-27 DIAGNOSIS — J449 Chronic obstructive pulmonary disease, unspecified: Secondary | ICD-10-CM | POA: Insufficient documentation

## 2012-09-27 DIAGNOSIS — F17200 Nicotine dependence, unspecified, uncomplicated: Secondary | ICD-10-CM | POA: Insufficient documentation

## 2012-09-29 ENCOUNTER — Encounter (HOSPITAL_COMMUNITY): Payer: Self-pay

## 2012-10-04 ENCOUNTER — Encounter (HOSPITAL_COMMUNITY): Payer: Self-pay

## 2012-10-06 ENCOUNTER — Encounter (HOSPITAL_COMMUNITY): Payer: Self-pay

## 2012-10-07 ENCOUNTER — Ambulatory Visit: Payer: Self-pay | Admitting: Internal Medicine

## 2012-10-11 ENCOUNTER — Encounter (HOSPITAL_COMMUNITY): Payer: Self-pay

## 2012-10-13 ENCOUNTER — Encounter (HOSPITAL_COMMUNITY): Admission: RE | Admit: 2012-10-13 | Payer: Self-pay | Source: Ambulatory Visit

## 2012-10-16 IMAGING — CR DG CHEST 2V
2 series · 2 of 2 positions shown · non-contrast
Comparison: CT of 04/28/2012.  Plain film of 04/28/2012

CLINICAL DATA: Shortness of breath and hypertension.  Ex-smoker.
COPD.

CHEST - 2 VIEW

[view not recorded (1 of 2)]
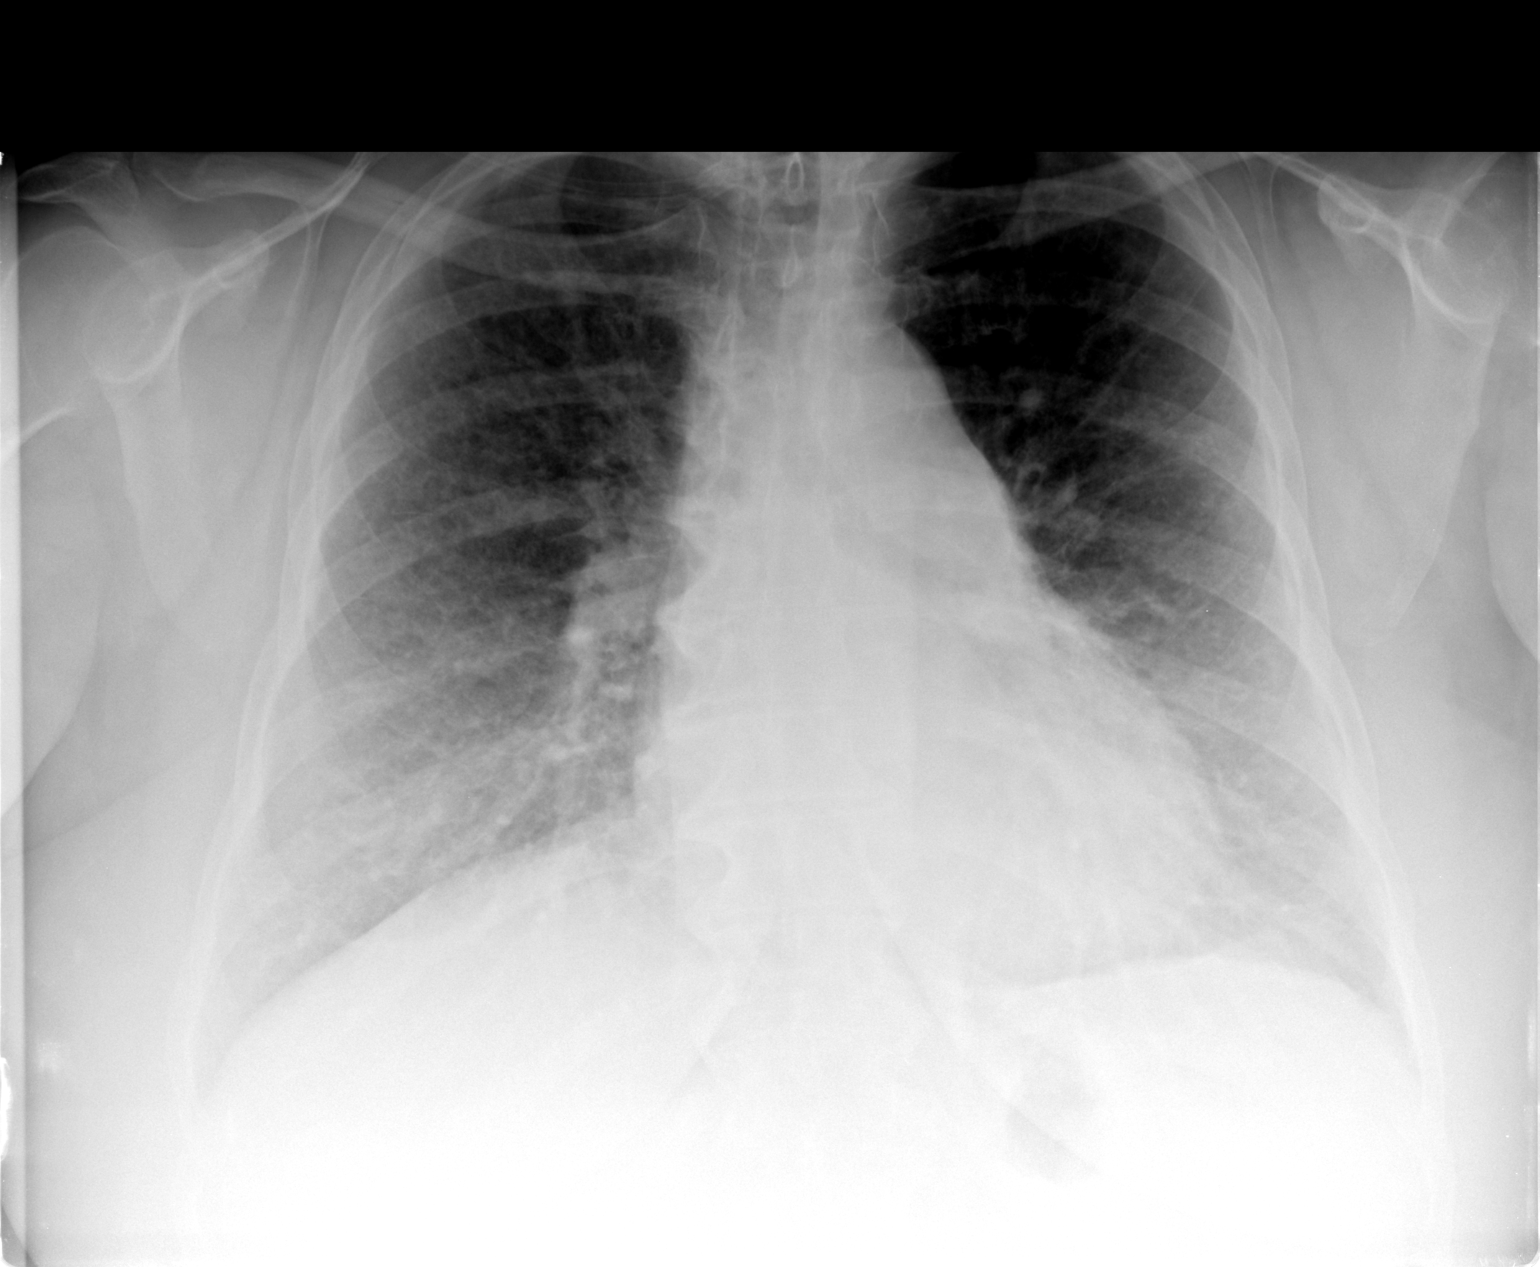

[view not recorded (2 of 2)]
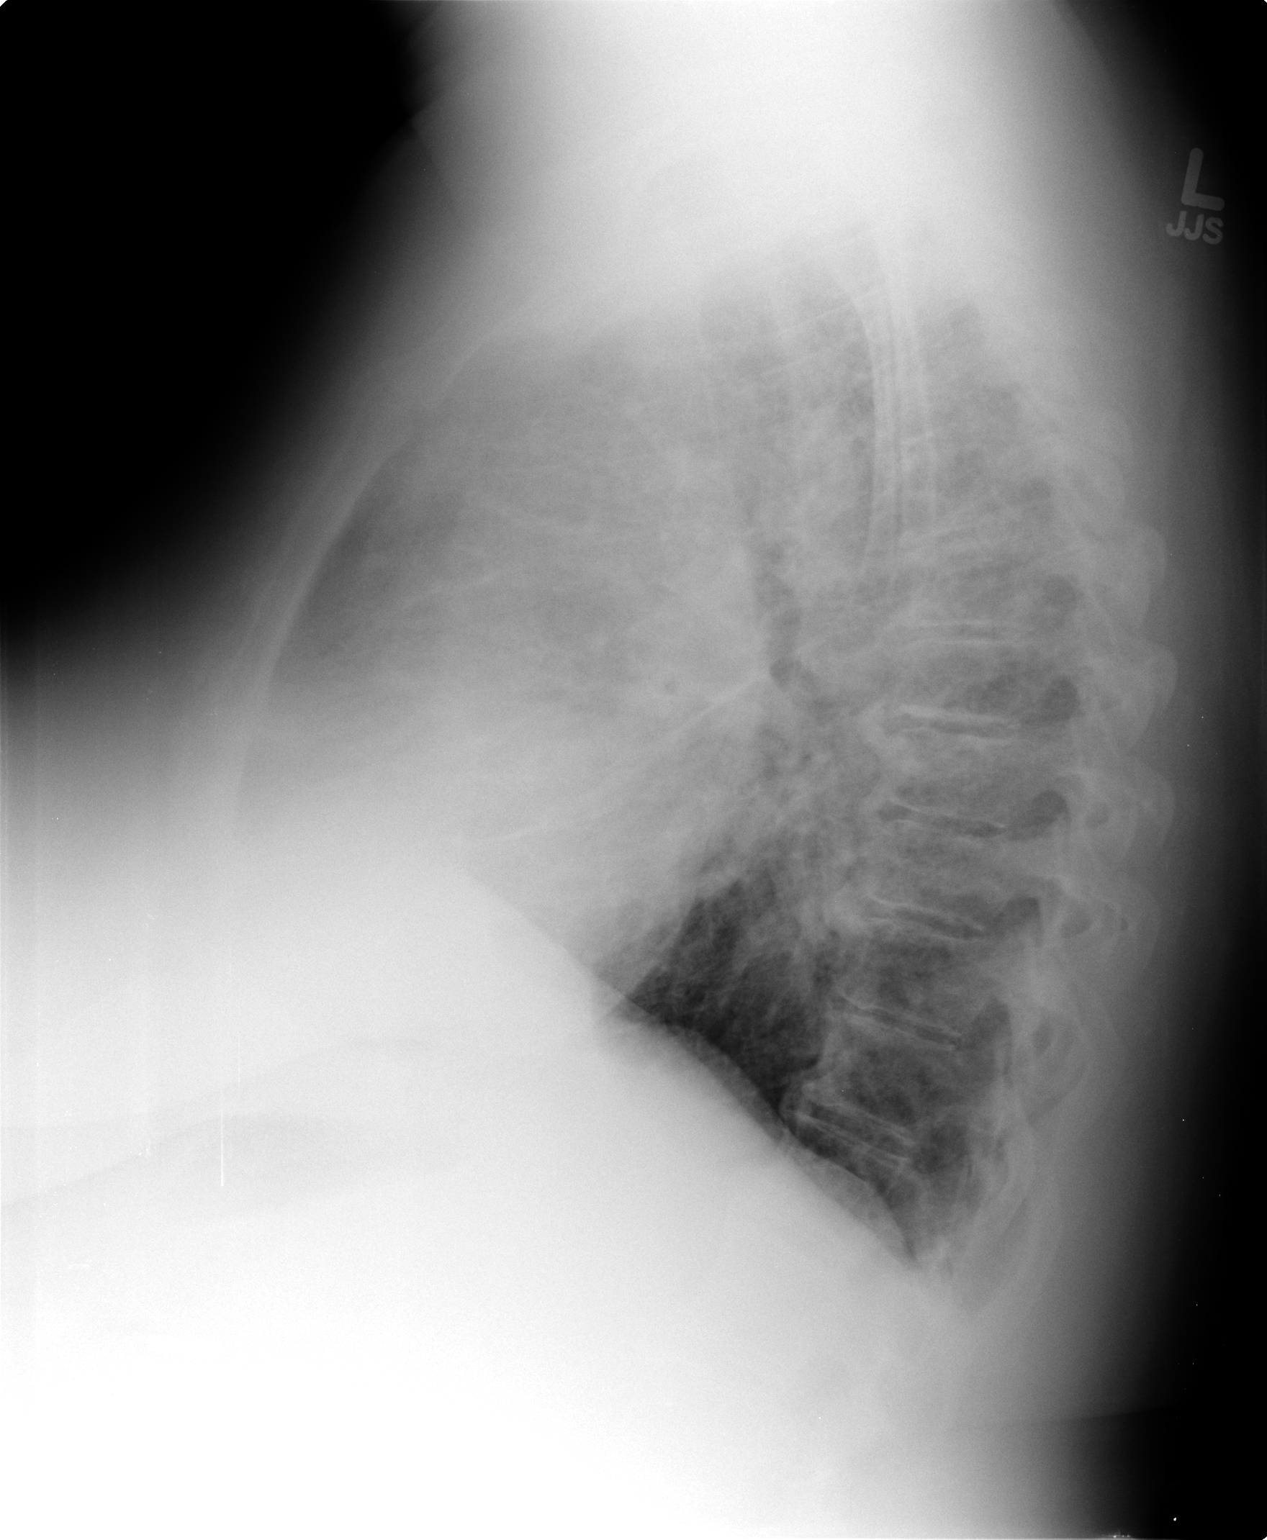

[2 of 2 positions shown; findings below may reference images not displayed]

FINDINGS: Midline trachea.  Mild cardiomegaly. Mediastinal contours
otherwise within normal limits.  No pleural effusion or
pneumothorax.  Super segment left lower lobe airspace disease is
persistent and slightly less confluent on the lateral view.
Similar pulmonary interstitial prominence and indistinctness.
Right lung remains clear.
IMPRESSION: 1.  Persistent but slightly improved left lower lobe airspace
disease; likely pneumonia.
2.  Interstitial prominence suggests pulmonary venous congestion.

## 2012-10-18 ENCOUNTER — Encounter (HOSPITAL_COMMUNITY)
Admission: RE | Admit: 2012-10-18 | Discharge: 2012-10-18 | Disposition: A | Discharge status: Home / Self Care | Payer: Self-pay | Source: Ambulatory Visit | Attending: Internal Medicine | Admitting: Internal Medicine

## 2012-10-18 NOTE — Progress Notes (Signed)
Margaret [name] came in today to inform staff she will be discontinuing Pulmonary rehab due to financial issues.  She has been having difficulty with her knees as well.  We reviewed home exercise and gave encouragement and support.  She did well with exercise and was excited about feeling better and doing something positive about her health. We will be happy to readmit when she is ready to return.   Cathie Olden RN

## 2012-10-20 ENCOUNTER — Encounter (HOSPITAL_COMMUNITY): Payer: Self-pay

## 2012-10-25 ENCOUNTER — Encounter (HOSPITAL_COMMUNITY): Payer: Self-pay | Attending: Internal Medicine

## 2012-10-25 DIAGNOSIS — I5033 Acute on chronic diastolic (congestive) heart failure: Secondary | ICD-10-CM | POA: Insufficient documentation

## 2012-10-25 DIAGNOSIS — J4489 Other specified chronic obstructive pulmonary disease: Secondary | ICD-10-CM | POA: Insufficient documentation

## 2012-10-25 DIAGNOSIS — Z5189 Encounter for other specified aftercare: Secondary | ICD-10-CM | POA: Insufficient documentation

## 2012-10-25 DIAGNOSIS — F17200 Nicotine dependence, unspecified, uncomplicated: Secondary | ICD-10-CM | POA: Insufficient documentation

## 2012-10-25 DIAGNOSIS — J449 Chronic obstructive pulmonary disease, unspecified: Secondary | ICD-10-CM | POA: Insufficient documentation

## 2012-10-27 ENCOUNTER — Encounter (HOSPITAL_COMMUNITY): Payer: Self-pay

## 2012-11-01 ENCOUNTER — Encounter (HOSPITAL_COMMUNITY): Payer: Self-pay

## 2012-11-03 ENCOUNTER — Encounter (HOSPITAL_COMMUNITY): Payer: Self-pay

## 2012-11-08 ENCOUNTER — Encounter (HOSPITAL_COMMUNITY): Payer: Self-pay

## 2012-11-10 ENCOUNTER — Encounter (HOSPITAL_COMMUNITY): Payer: Self-pay

## 2012-11-15 ENCOUNTER — Encounter (HOSPITAL_COMMUNITY): Payer: Self-pay

## 2012-11-17 ENCOUNTER — Encounter (HOSPITAL_COMMUNITY): Payer: Self-pay

## 2012-11-22 ENCOUNTER — Encounter (HOSPITAL_COMMUNITY): Payer: Self-pay

## 2012-11-24 ENCOUNTER — Encounter (HOSPITAL_COMMUNITY): Payer: Self-pay

## 2012-11-29 ENCOUNTER — Encounter (HOSPITAL_COMMUNITY): Payer: Self-pay

## 2012-12-01 ENCOUNTER — Encounter (HOSPITAL_COMMUNITY): Payer: Self-pay

## 2012-12-06 ENCOUNTER — Encounter (HOSPITAL_COMMUNITY): Payer: Self-pay

## 2012-12-08 ENCOUNTER — Encounter (HOSPITAL_COMMUNITY): Payer: Self-pay

## 2012-12-13 ENCOUNTER — Encounter (HOSPITAL_COMMUNITY): Payer: Self-pay

## 2012-12-15 ENCOUNTER — Encounter (HOSPITAL_COMMUNITY): Payer: Self-pay

## 2012-12-20 ENCOUNTER — Encounter (HOSPITAL_COMMUNITY): Payer: Self-pay

## 2012-12-22 ENCOUNTER — Encounter (HOSPITAL_COMMUNITY): Payer: Self-pay

## 2012-12-27 ENCOUNTER — Encounter (HOSPITAL_COMMUNITY): Payer: Self-pay

## 2012-12-29 ENCOUNTER — Encounter (HOSPITAL_COMMUNITY): Payer: Self-pay

## 2013-01-03 ENCOUNTER — Encounter (HOSPITAL_COMMUNITY): Payer: Self-pay

## 2013-01-05 ENCOUNTER — Encounter (HOSPITAL_COMMUNITY): Payer: Self-pay

## 2013-01-10 ENCOUNTER — Encounter (HOSPITAL_COMMUNITY): Payer: Self-pay

## 2013-01-12 ENCOUNTER — Encounter (HOSPITAL_COMMUNITY): Payer: Self-pay

## 2013-01-17 ENCOUNTER — Encounter (HOSPITAL_COMMUNITY): Payer: Self-pay

## 2013-01-19 ENCOUNTER — Encounter (HOSPITAL_COMMUNITY): Payer: Self-pay

## 2013-01-24 ENCOUNTER — Encounter (HOSPITAL_COMMUNITY): Payer: Self-pay

## 2013-01-26 ENCOUNTER — Encounter (HOSPITAL_COMMUNITY): Payer: Self-pay

## 2013-01-31 ENCOUNTER — Encounter (HOSPITAL_COMMUNITY): Payer: Self-pay

## 2013-02-02 ENCOUNTER — Encounter (HOSPITAL_COMMUNITY): Payer: Self-pay

## 2013-02-07 ENCOUNTER — Encounter (HOSPITAL_COMMUNITY): Payer: Self-pay

## 2013-02-09 ENCOUNTER — Encounter (HOSPITAL_COMMUNITY): Payer: Self-pay

## 2013-02-14 ENCOUNTER — Encounter (HOSPITAL_COMMUNITY): Payer: Self-pay

## 2013-02-16 ENCOUNTER — Encounter (HOSPITAL_COMMUNITY): Payer: Self-pay

## 2013-02-21 ENCOUNTER — Encounter (HOSPITAL_COMMUNITY): Payer: Self-pay

## 2013-06-16 ENCOUNTER — Other Ambulatory Visit: Payer: Self-pay | Admitting: Internal Medicine

## 2013-06-16 DIAGNOSIS — R0602 Shortness of breath: Secondary | ICD-10-CM

## 2013-06-19 NOTE — Telephone Encounter (Signed)
Medication refill

## 2013-10-09 ENCOUNTER — Emergency Department (HOSPITAL_COMMUNITY): Payer: Self-pay

## 2013-10-09 ENCOUNTER — Emergency Department (HOSPITAL_COMMUNITY)
Admission: EM | Admit: 2013-10-09 | Discharge: 2013-10-09 | Disposition: A | Discharge status: Home / Self Care | Payer: Self-pay | Attending: Emergency Medicine | Admitting: Emergency Medicine

## 2013-10-09 ENCOUNTER — Encounter (HOSPITAL_COMMUNITY): Payer: Self-pay | Admitting: Emergency Medicine

## 2013-10-09 DIAGNOSIS — Z8659 Personal history of other mental and behavioral disorders: Secondary | ICD-10-CM | POA: Insufficient documentation

## 2013-10-09 DIAGNOSIS — Z862 Personal history of diseases of the blood and blood-forming organs and certain disorders involving the immune mechanism: Secondary | ICD-10-CM | POA: Insufficient documentation

## 2013-10-09 DIAGNOSIS — Z8701 Personal history of pneumonia (recurrent): Secondary | ICD-10-CM | POA: Insufficient documentation

## 2013-10-09 DIAGNOSIS — E119 Type 2 diabetes mellitus without complications: Secondary | ICD-10-CM | POA: Insufficient documentation

## 2013-10-09 DIAGNOSIS — M1990 Unspecified osteoarthritis, unspecified site: Secondary | ICD-10-CM | POA: Insufficient documentation

## 2013-10-09 DIAGNOSIS — G43909 Migraine, unspecified, not intractable, without status migrainosus: Secondary | ICD-10-CM | POA: Insufficient documentation

## 2013-10-09 DIAGNOSIS — J441 Chronic obstructive pulmonary disease with (acute) exacerbation: Secondary | ICD-10-CM | POA: Insufficient documentation

## 2013-10-09 DIAGNOSIS — Z7982 Long term (current) use of aspirin: Secondary | ICD-10-CM | POA: Insufficient documentation

## 2013-10-09 DIAGNOSIS — Z87891 Personal history of nicotine dependence: Secondary | ICD-10-CM | POA: Insufficient documentation

## 2013-10-09 DIAGNOSIS — Z91040 Latex allergy status: Secondary | ICD-10-CM | POA: Insufficient documentation

## 2013-10-09 DIAGNOSIS — E785 Hyperlipidemia, unspecified: Secondary | ICD-10-CM | POA: Insufficient documentation

## 2013-10-09 DIAGNOSIS — Z79899 Other long term (current) drug therapy: Secondary | ICD-10-CM | POA: Insufficient documentation

## 2013-10-09 DIAGNOSIS — Z8719 Personal history of other diseases of the digestive system: Secondary | ICD-10-CM | POA: Insufficient documentation

## 2013-10-09 DIAGNOSIS — R51 Headache: Secondary | ICD-10-CM | POA: Insufficient documentation

## 2013-10-09 DIAGNOSIS — Z794 Long term (current) use of insulin: Secondary | ICD-10-CM | POA: Insufficient documentation

## 2013-10-09 DIAGNOSIS — I10 Essential (primary) hypertension: Secondary | ICD-10-CM | POA: Insufficient documentation

## 2013-10-09 LAB — BASIC METABOLIC PANEL
Calcium: 10 mg/dL (ref 8.4–10.5)
GFR calc Af Amer: >90 mL/min (ref >90)
GFR calc non Af Amer: >90 mL/min (ref >90)
Potassium: 4.1 mEq/L (ref 3.5–5.1)
Sodium: 136 mEq/L (ref 135–145)

## 2013-10-09 LAB — TROPONIN I: Troponin I: <0.3 ng/mL (ref <0.30)

## 2013-10-09 LAB — CBC
Hemoglobin: 14.5 g/dL (ref 12.0–15.0)
MCH: 28.5 pg (ref 26.0–34.0)
Platelets: 266 10*3/uL (ref 150–400)
RBC: 5.09 MIL/uL (ref 3.87–5.11)
WBC: 8.6 10*3/uL (ref 4.0–10.5)

## 2013-10-09 LAB — PRO B NATRIURETIC PEPTIDE: Pro B Natriuretic peptide (BNP): 330.5 pg/mL — ABNORMAL HIGH (ref 0–125)

## 2013-10-09 LAB — GLUCOSE, CAPILLARY: Glucose-Capillary: 146 mg/dL — ABNORMAL HIGH (ref 70–99)

## 2013-10-09 MED ORDER — DIPHENHYDRAMINE HCL 50 MG/ML IJ SOLN
25.0000 mg | Freq: Once | INTRAMUSCULAR | Status: AC
  Administered 2013-10-09: 25 mg via INTRAVENOUS
  Filled 2013-10-09: qty 1

## 2013-10-09 MED ORDER — METOCLOPRAMIDE HCL 5 MG/ML IJ SOLN
10.0000 mg | Freq: Once | INTRAMUSCULAR | Status: AC
  Administered 2013-10-09: 10 mg via INTRAVENOUS
  Filled 2013-10-09: qty 2

## 2013-10-09 MED ORDER — FUROSEMIDE 10 MG/ML IJ SOLN
40.0000 mg | Freq: Once | INTRAMUSCULAR | Status: AC
  Administered 2013-10-09: 40 mg via INTRAVENOUS
  Filled 2013-10-09: qty 4

## 2013-10-09 MED ORDER — IPRATROPIUM BROMIDE 0.02 % IN SOLN
0.5000 mg | Freq: Once | RESPIRATORY_TRACT | Status: AC
  Administered 2013-10-09: 0.5 mg via RESPIRATORY_TRACT
  Filled 2013-10-09: qty 2.5

## 2013-10-09 MED ORDER — HYDROCODONE-ACETAMINOPHEN 5-325 MG PO TABS: 1.0000 | ORAL_TABLET | Freq: Four times a day (QID) | ORAL | Status: DC | PRN

## 2013-10-09 MED ORDER — KETOROLAC TROMETHAMINE 30 MG/ML IJ SOLN
30.0000 mg | Freq: Once | INTRAMUSCULAR | Status: AC
  Administered 2013-10-09: 30 mg via INTRAVENOUS
  Filled 2013-10-09: qty 1

## 2013-10-09 MED ORDER — ALBUTEROL SULFATE (5 MG/ML) 0.5% IN NEBU
5.0000 mg | INHALATION_SOLUTION | Freq: Once | RESPIRATORY_TRACT | Status: AC
  Administered 2013-10-09: 5 mg via RESPIRATORY_TRACT
  Filled 2013-10-09: qty 1

## 2013-10-09 NOTE — ED Notes (Signed)
Pt is here with migraine, cold, nauseated, and hard to breath.  Pt has diabetes and has not checked sugar this am.

## 2013-10-09 NOTE — ED Notes (Signed)
Patient transported to CT 

## 2013-10-09 NOTE — ED Provider Notes (Signed)
CSN: [id number]     Arrival date & time 10/09/13  1042 History   First MD Initiated Contact with Patient 10/09/13 1127     Chief Complaint  Patient presents with  . Shortness of Breath  . Headache   (Consider location/radiation/quality/duration/timing/severity/associated sxs/prior Treatment) Patient is a 57 y.o. female presenting with headaches. The history is provided by the patient (pt complains of  a headache).  Headache Pain location:  Generalized Quality:  Dull Radiates to:  Does not radiate Severity currently:  6/10 Severity at highest:  9/10 Onset quality:  Gradual Timing:  Constant Progression:  Unchanged Associated symptoms: no abdominal pain, no back pain, no congestion, no cough, no diarrhea, no fatigue, no seizures and no sinus pressure     Past Medical History  Diagnosis Date  . Hypertension   . Hyperlipidemia   . COPD (chronic obstructive pulmonary disease) 04/20/12  . Asthma   . Type II diabetes mellitus   . Anemia   . H/O hiatal hernia   . Osteoarthritis   . Pneumonia 04/2012  . Migraines   . Panic attacks 04/20/12  . Morbid obesity    Past Surgical History  Procedure Laterality Date  . Laceration repair  1966    "right upper arm"  . Dilation and curettage of uterus  1990's  . Finger reattachment  1973    "right ring finger"   Family History  Problem Relation Age of Onset  . Allergies    . Hypertension    . Coronary artery disease Mother 16  . Stroke Father   . COPD Father    History  Substance Use Topics  . Smoking status: Former Smoker -- 0.20 packs/day for 40 years    Types: Cigarettes    Quit date: 04/20/2012  . Smokeless tobacco: Never Used     Comment: 6/5//13 "a pack of cigarettes would last me 1 - 1 1/2 wk"  . Alcohol Use: No   OB History   Grav Para Term Preterm Abortions TAB SAB Ect Mult Living                 Review of Systems  Constitutional: Negative for appetite change and fatigue.  HENT: Negative for congestion, ear  discharge and sinus pressure.   Eyes: Negative for discharge.  Respiratory: Negative for cough.   Cardiovascular: Negative for chest pain.  Gastrointestinal: Negative for abdominal pain and diarrhea.  Genitourinary: Negative for frequency and hematuria.  Musculoskeletal: Negative for back pain.  Skin: Negative for rash.  Neurological: Positive for headaches. Negative for seizures.  Psychiatric/Behavioral: Negative for hallucinations.    Allergies  Codeine and Latex  Home Medications   Current Outpatient Rx  Name  Route  Sig  Dispense  Refill  . albuterol (PROVENTIL HFA;VENTOLIN HFA) 108 (90 BASE) MCG/ACT inhaler   Inhalation   Inhale 2 puffs into the lungs every 4 (four) hours as needed. For shortness of breath.         Marland Kitchen albuterol (PROVENTIL) (2.5 MG/3ML) 0.083% nebulizer solution   Nebulization   Take 2.5 mg by nebulization every 6 (six) hours as needed for wheezing or shortness of breath.         Marland Kitchen aspirin 81 MG chewable tablet   Oral   Chew 81 mg by mouth daily.         Marland Kitchen atorvastatin (LIPITOR) 20 MG tablet   Oral   Take 20 mg by mouth at bedtime.         Marland Kitchen  Fluticasone-Salmeterol (ADVAIR) 250-50 MCG/DOSE AEPB   Inhalation   Inhale 1 puff into the lungs 2 (two) times daily.         . furosemide (LASIX) 40 MG tablet   Oral   Take 40 mg by mouth daily.          Marland Kitchen ibuprofen (ADVIL,MOTRIN) 200 MG tablet   Oral   Take 600 mg by mouth every 6 (six) hours as needed for headache or mild pain.         Marland Kitchen insulin NPH-regular (NOVOLIN 70/30) (70-30) 100 UNIT/ML injection   Subcutaneous   Inject 6-16 Units into the skin 2 (two) times daily with a meal.         . loperamide (IMODIUM) 2 MG capsule   Oral   Take 2 mg by mouth daily as needed for diarrhea or loose stools.         . metFORMIN (GLUCOPHAGE) 1000 MG tablet   Oral   Take 1,000 mg by mouth 2 (two) times daily with a meal.         . spironolactone (ALDACTONE) 25 MG tablet   Oral   Take 25  mg by mouth daily.         Marland Kitchen atenolol (TENORMIN) 50 MG tablet   Oral   Take 50 mg by mouth daily.         Marland Kitchen HYDROcodone-acetaminophen (NORCO/VICODIN) 5-325 MG per tablet   Oral   Take 1 tablet by mouth every 6 (six) hours as needed for moderate pain.   12 tablet   0   . EXPIRED: nitroGLYCERIN (NITROSTAT) 0.4 MG SL tablet   Sublingual   Place 1 tablet (0.4 mg total) under the tongue every 5 (five) minutes x 3 doses as needed for chest pain.   25 tablet   6    BP 142/54  Pulse 64  Temp(Src) 98.3 F (36.8 C) (Oral)  Resp 22  Ht 5\' 6"  (1.676 m)  Wt 293 lb 1.6 oz (132.949 kg)  BMI 47.33 kg/m2  SpO2 97% Physical Exam  Constitutional: She is oriented to person, place, and time. She appears well-developed.  HENT:  Head: Normocephalic.  Eyes: Conjunctivae and EOM are normal. No scleral icterus.  Neck: Neck supple. No thyromegaly present.  Cardiovascular: Normal rate and regular rhythm.  Exam reveals no gallop and no friction rub.   No murmur heard. Pulmonary/Chest: No stridor. She has no wheezes. She has no rales. She exhibits no tenderness.  Abdominal: She exhibits no distension. There is no tenderness. There is no rebound.  Musculoskeletal: Normal range of motion. She exhibits no edema.  Lymphadenopathy:    She has no cervical adenopathy.  Neurological: She is oriented to person, place, and time. She exhibits normal muscle tone. Coordination normal.  Skin: No rash noted. No erythema.  Psychiatric: She has a normal mood and affect. Her behavior is normal.    ED Course  Procedures (including critical care time) Labs Review Labs Reviewed  BASIC METABOLIC PANEL - Abnormal; Notable for the following:    Glucose, Bld 175 (*)    All other components within normal limits  GLUCOSE, CAPILLARY - Abnormal; Notable for the following:    Glucose-Capillary 146 (*)    All other components within normal limits  PRO B NATRIURETIC PEPTIDE - Abnormal; Notable for the following:     Pro B Natriuretic peptide (BNP) 330.5 (*)    All other components within normal limits  CBC  TROPONIN I  Imaging Review Ct Head Wo Contrast  10/09/2013   CLINICAL DATA:  Shortness of breath, headache.  EXAM: CT HEAD WITHOUT CONTRAST  TECHNIQUE: Contiguous axial images were obtained from the base of the skull through the vertex without intravenous contrast.  COMPARISON:  04/17/2010.  FINDINGS: No evidence of an acute infarct, acute hemorrhage, mass lesion, mass effect or hydrocephalus. There may be a remote lacunar infarct in the left basal ganglia. No air-fluid levels in the paranasal sinuses or mastoid air cells.  IMPRESSION: No acute intracranial abnormality.   Electronically Signed   By: Leanna Battles M.D.   On: 10/09/2013 12:50   Dg Chest Port 1 View  10/09/2013   CLINICAL DATA:  Shortness of breath, chest pain and nausea.  EXAM: PORTABLE CHEST - 1 VIEW  COMPARISON:  CT CHEST 06/18/2012 and chest radiograph 06/15/2012.  FINDINGS: Trachea is midline. Heart is enlarged. Mild diffuse interstitial prominence and indistinctness. No definite pleural fluid.  IMPRESSION: Favor mild pulmonary edema.   Electronically Signed   By: Leanna Battles M.D.   On: 10/09/2013 11:53    EKG Interpretation    Date/Time:  Monday October 09 2013 10:59:57 EST Ventricular Rate:  61 PR Interval:  144 QRS Duration: 86 QT Interval:  438 QTC Calculation: 440 R Axis:   59 Text Interpretation:  Normal sinus rhythm Normal ECG            MDM  Pt with copd.  Improved with tx.  Headache inproved with tx 1. Headache        Benny Lennert, MD 10/09/13 [phone]

## 2013-10-09 NOTE — ED Notes (Signed)
Pt c/o migraine headache since yesterday with shortness of breath and nausea. Pt sensitive to light. Pt moaning in pain on arrival. Hx of migraines, but never this bad.

## 2016-01-14 ENCOUNTER — Inpatient Hospital Stay (HOSPITAL_COMMUNITY): Payer: Medicaid Other

## 2016-01-14 ENCOUNTER — Emergency Department (HOSPITAL_COMMUNITY): Payer: Medicaid Other

## 2016-01-14 ENCOUNTER — Encounter (HOSPITAL_COMMUNITY): Payer: Self-pay | Admitting: Emergency Medicine

## 2016-01-14 ENCOUNTER — Inpatient Hospital Stay (HOSPITAL_COMMUNITY)
Admission: EM | Admit: 2016-01-14 | Discharge: 2016-01-16 | DRG: 815 | Disposition: A | Discharge status: Home / Self Care | Payer: Medicaid Other | Attending: Internal Medicine | Admitting: Internal Medicine

## 2016-01-14 ENCOUNTER — Inpatient Hospital Stay (HOSPITAL_COMMUNITY)
Admit: 2016-01-14 | Discharge: 2016-01-14 | Disposition: A | Discharge status: Home / Self Care | Payer: Medicaid Other | Attending: Family Medicine | Admitting: Family Medicine

## 2016-01-14 DIAGNOSIS — K7460 Unspecified cirrhosis of liver: Secondary | ICD-10-CM | POA: Diagnosis present

## 2016-01-14 DIAGNOSIS — IMO0001 Reserved for inherently not codable concepts without codable children: Secondary | ICD-10-CM

## 2016-01-14 DIAGNOSIS — E109 Type 1 diabetes mellitus without complications: Secondary | ICD-10-CM | POA: Diagnosis present

## 2016-01-14 DIAGNOSIS — R1012 Left upper quadrant pain: Secondary | ICD-10-CM | POA: Diagnosis present

## 2016-01-14 DIAGNOSIS — Z794 Long term (current) use of insulin: Secondary | ICD-10-CM

## 2016-01-14 DIAGNOSIS — K760 Fatty (change of) liver, not elsewhere classified: Secondary | ICD-10-CM | POA: Diagnosis present

## 2016-01-14 DIAGNOSIS — I5032 Chronic diastolic (congestive) heart failure: Secondary | ICD-10-CM | POA: Diagnosis present

## 2016-01-14 DIAGNOSIS — Z7982 Long term (current) use of aspirin: Secondary | ICD-10-CM | POA: Diagnosis not present

## 2016-01-14 DIAGNOSIS — I110 Hypertensive heart disease with heart failure: Secondary | ICD-10-CM | POA: Diagnosis present

## 2016-01-14 DIAGNOSIS — R0600 Dyspnea, unspecified: Secondary | ICD-10-CM

## 2016-01-14 DIAGNOSIS — Z6841 Body Mass Index (BMI) 40.0 and over, adult: Secondary | ICD-10-CM

## 2016-01-14 DIAGNOSIS — D735 Infarction of spleen: Secondary | ICD-10-CM

## 2016-01-14 DIAGNOSIS — E119 Type 2 diabetes mellitus without complications: Secondary | ICD-10-CM

## 2016-01-14 DIAGNOSIS — E785 Hyperlipidemia, unspecified: Secondary | ICD-10-CM | POA: Diagnosis present

## 2016-01-14 DIAGNOSIS — M1990 Unspecified osteoarthritis, unspecified site: Secondary | ICD-10-CM | POA: Diagnosis present

## 2016-01-14 DIAGNOSIS — R112 Nausea with vomiting, unspecified: Secondary | ICD-10-CM

## 2016-01-14 DIAGNOSIS — R1010 Upper abdominal pain, unspecified: Secondary | ICD-10-CM | POA: Insufficient documentation

## 2016-01-14 DIAGNOSIS — J449 Chronic obstructive pulmonary disease, unspecified: Secondary | ICD-10-CM | POA: Diagnosis present

## 2016-01-14 DIAGNOSIS — I10 Essential (primary) hypertension: Secondary | ICD-10-CM

## 2016-01-14 DIAGNOSIS — J45909 Unspecified asthma, uncomplicated: Secondary | ICD-10-CM | POA: Diagnosis present

## 2016-01-14 DIAGNOSIS — M7989 Other specified soft tissue disorders: Secondary | ICD-10-CM

## 2016-01-14 DIAGNOSIS — Z87891 Personal history of nicotine dependence: Secondary | ICD-10-CM

## 2016-01-14 DIAGNOSIS — Z23 Encounter for immunization: Secondary | ICD-10-CM

## 2016-01-14 DIAGNOSIS — I771 Stricture of artery: Secondary | ICD-10-CM

## 2016-01-14 HISTORY — DX: Infarction of spleen: D73.5

## 2016-01-14 LAB — COMPREHENSIVE METABOLIC PANEL
ALT: 16 U/L (ref 14–54)
ANION GAP: 13 (ref 5–15)
AST: 17 U/L (ref 15–41)
Albumin: 3.3 g/dL — ABNORMAL LOW (ref 3.5–5.0)
Alkaline Phosphatase: 78 U/L (ref 38–126)
BUN: 18 mg/dL (ref 6–20)
CHLORIDE: 97 mmol/L — AB (ref 101–111)
CO2: 22 mmol/L (ref 22–32)
Calcium: 10.6 mg/dL — ABNORMAL HIGH (ref 8.9–10.3)
Creatinine, Ser: 0.94 mg/dL (ref 0.44–1.00)
GFR calc non Af Amer: >60 mL/min (ref >60)
Glucose, Bld: 269 mg/dL — ABNORMAL HIGH (ref 65–99)
Potassium: 4.5 mmol/L (ref 3.5–5.1)
SODIUM: 132 mmol/L — AB (ref 135–145)
Total Bilirubin: 0.5 mg/dL (ref 0.3–1.2)
Total Protein: 7.3 g/dL (ref 6.5–8.1)

## 2016-01-14 LAB — URINE MICROSCOPIC-ADD ON

## 2016-01-14 LAB — DIFFERENTIAL
Basophils Absolute: 0 10*3/uL (ref 0.0–0.1)
Basophils Relative: 0 %
EOS PCT: 0 %
Eosinophils Absolute: 0 10*3/uL (ref 0.0–0.7)
LYMPHS ABS: 1 10*3/uL (ref 0.7–4.0)
Lymphocytes Relative: 6 %
MONO ABS: 0.8 10*3/uL (ref 0.1–1.0)
Monocytes Relative: 4 %
NEUTROS PCT: 90 %
Neutro Abs: 15.5 10*3/uL — ABNORMAL HIGH (ref 1.7–7.7)

## 2016-01-14 LAB — CBC
HCT: 40.5 % (ref 36.0–46.0)
HEMOGLOBIN: 13.1 g/dL (ref 12.0–15.0)
MCH: 28.2 pg (ref 26.0–34.0)
MCHC: 32.3 g/dL (ref 30.0–36.0)
MCV: 87.1 fL (ref 78.0–100.0)
Platelets: 288 10*3/uL (ref 150–400)
RBC: 4.65 MIL/uL (ref 3.87–5.11)
RDW: 15.2 % (ref 11.5–15.5)
WBC: 17.5 10*3/uL — ABNORMAL HIGH (ref 4.0–10.5)

## 2016-01-14 LAB — URINALYSIS, ROUTINE W REFLEX MICROSCOPIC
Bilirubin Urine: NEGATIVE
GLUCOSE, UA: 250 mg/dL — AB
Ketones, ur: NEGATIVE mg/dL
Leukocytes, UA: NEGATIVE
Nitrite: NEGATIVE
Protein, ur: NEGATIVE mg/dL
pH: 5.5 (ref 5.0–8.0)

## 2016-01-14 LAB — LACTIC ACID, PLASMA
Lactic Acid, Venous: 1 mmol/L (ref 0.5–2.0)
Lactic Acid, Venous: 1.5 mmol/L (ref 0.5–2.0)

## 2016-01-14 LAB — CBG MONITORING, ED
GLUCOSE-CAPILLARY: 254 mg/dL — AB (ref 65–99)
GLUCOSE-CAPILLARY: 251 mg/dL — AB (ref 65–99)

## 2016-01-14 LAB — GLUCOSE, CAPILLARY
Glucose-Capillary: 210 mg/dL — ABNORMAL HIGH (ref 65–99)
Glucose-Capillary: 131 mg/dL — ABNORMAL HIGH (ref 65–99)

## 2016-01-14 LAB — LIPASE, BLOOD: LIPASE: 25 U/L (ref 11–51)

## 2016-01-14 LAB — HEPARIN LEVEL (UNFRACTIONATED): HEPARIN UNFRACTIONATED: 0.23 [IU]/mL — AB (ref 0.30–0.70)

## 2016-01-14 LAB — ANTITHROMBIN III: ANTITHROMB III FUNC: 79 % (ref 75–120)

## 2016-01-14 MED ORDER — ONDANSETRON HCL 4 MG/2ML IJ SOLN
4.0000 mg | Freq: Once | INTRAMUSCULAR | Status: AC
  Administered 2016-01-14: 4 mg via INTRAVENOUS
  Filled 2016-01-14: qty 2

## 2016-01-14 MED ORDER — SPIRONOLACTONE 25 MG PO TABS
50.0000 mg | ORAL_TABLET | Freq: Every day | ORAL | Status: DC
  Administered 2016-01-14 – 2016-01-16 (×3): 50 mg via ORAL
  Filled 2016-01-14: qty 1
  Filled 2016-01-14 (×4): qty 2

## 2016-01-14 MED ORDER — MORPHINE SULFATE (PF) 2 MG/ML IV SOLN
2.0000 mg | INTRAVENOUS | Status: DC | PRN
  Administered 2016-01-14 – 2016-01-15 (×2): 2 mg via INTRAVENOUS
  Filled 2016-01-14 (×2): qty 1

## 2016-01-14 MED ORDER — ACETAMINOPHEN 650 MG RE SUPP
650.0000 mg | Freq: Four times a day (QID) | RECTAL | Status: DC | PRN
Start: 2016-01-14 — End: 2016-01-16

## 2016-01-14 MED ORDER — HYDROMORPHONE HCL 1 MG/ML IJ SOLN
1.0000 mg | Freq: Once | INTRAMUSCULAR | Status: AC
  Administered 2016-01-14: 1 mg via INTRAVENOUS
  Filled 2016-01-14: qty 1

## 2016-01-14 MED ORDER — IOHEXOL 300 MG/ML  SOLN
100.0000 mL | Freq: Once | INTRAMUSCULAR | Status: AC | PRN
  Administered 2016-01-14: 100 mL via INTRAVENOUS

## 2016-01-14 MED ORDER — FUROSEMIDE 40 MG PO TABS
40.0000 mg | ORAL_TABLET | Freq: Every day | ORAL | Status: DC
  Administered 2016-01-14 – 2016-01-16 (×3): 40 mg via ORAL
  Filled 2016-01-14 (×2): qty 1
  Filled 2016-01-14: qty 2

## 2016-01-14 MED ORDER — PRAVASTATIN SODIUM 20 MG PO TABS
20.0000 mg | ORAL_TABLET | Freq: Every day | ORAL | Status: DC
  Administered 2016-01-14 – 2016-01-15 (×2): 20 mg via ORAL
  Filled 2016-01-14 (×2): qty 1

## 2016-01-14 MED ORDER — SODIUM CHLORIDE 0.9 % IV SOLN
1000.0000 mL | INTRAVENOUS | Status: DC
  Administered 2016-01-14: 1000 mL via INTRAVENOUS

## 2016-01-14 MED ORDER — ONDANSETRON HCL 4 MG/2ML IJ SOLN
4.0000 mg | Freq: Four times a day (QID) | INTRAMUSCULAR | Status: DC | PRN
  Filled 2016-01-14: qty 2

## 2016-01-14 MED ORDER — SODIUM CHLORIDE 0.9 % IV SOLN
INTRAVENOUS | Status: DC
  Administered 2016-01-14 – 2016-01-15 (×4): via INTRAVENOUS

## 2016-01-14 MED ORDER — SODIUM CHLORIDE 0.9% FLUSH
3.0000 mL | Freq: Two times a day (BID) | INTRAVENOUS | Status: DC
  Administered 2016-01-15 – 2016-01-16 (×2): 3 mL via INTRAVENOUS

## 2016-01-14 MED ORDER — HEPARIN BOLUS VIA INFUSION
5000.0000 [IU] | Freq: Once | INTRAVENOUS | Status: AC
  Administered 2016-01-14: 5000 [IU] via INTRAVENOUS
  Filled 2016-01-14: qty 5000

## 2016-01-14 MED ORDER — ATENOLOL 25 MG PO TABS
25.0000 mg | ORAL_TABLET | Freq: Two times a day (BID) | ORAL | Status: DC
  Administered 2016-01-14 – 2016-01-16 (×5): 25 mg via ORAL
  Filled 2016-01-14 (×7): qty 1

## 2016-01-14 MED ORDER — OXYCODONE HCL 5 MG PO TABS
5.0000 mg | ORAL_TABLET | ORAL | Status: DC | PRN
  Administered 2016-01-14 – 2016-01-16 (×4): 5 mg via ORAL
  Filled 2016-01-14 (×4): qty 1

## 2016-01-14 MED ORDER — ALBUTEROL SULFATE (2.5 MG/3ML) 0.083% IN NEBU: 2.5000 mg | INHALATION_SOLUTION | RESPIRATORY_TRACT | Status: DC | PRN

## 2016-01-14 MED ORDER — INSULIN ASPART 100 UNIT/ML ~~LOC~~ SOLN
0.0000 [IU] | Freq: Three times a day (TID) | SUBCUTANEOUS | Status: DC
  Administered 2016-01-14 – 2016-01-15 (×4): 7 [IU] via SUBCUTANEOUS
  Administered 2016-01-15 – 2016-01-16 (×3): 4 [IU] via SUBCUTANEOUS
  Filled 2016-01-14: qty 1

## 2016-01-14 MED ORDER — ACETAMINOPHEN 325 MG PO TABS: 650.0000 mg | ORAL_TABLET | Freq: Four times a day (QID) | ORAL | Status: DC | PRN

## 2016-01-14 MED ORDER — ASPIRIN 81 MG PO CHEW
81.0000 mg | CHEWABLE_TABLET | Freq: Every day | ORAL | Status: DC
  Administered 2016-01-14 – 2016-01-16 (×3): 81 mg via ORAL
  Filled 2016-01-14 (×3): qty 1

## 2016-01-14 MED ORDER — CIPROFLOXACIN IN D5W 400 MG/200ML IV SOLN
400.0000 mg | Freq: Two times a day (BID) | INTRAVENOUS | Status: DC
  Administered 2016-01-14 – 2016-01-16 (×4): 400 mg via INTRAVENOUS
  Filled 2016-01-14 (×4): qty 200

## 2016-01-14 MED ORDER — ONDANSETRON HCL 4 MG PO TABS: 4.0000 mg | ORAL_TABLET | Freq: Four times a day (QID) | ORAL | Status: DC | PRN

## 2016-01-14 MED ORDER — SODIUM CHLORIDE 0.9 % IV SOLN
1000.0000 mL | Freq: Once | INTRAVENOUS | Status: AC
  Administered 2016-01-14: 1000 mL via INTRAVENOUS

## 2016-01-14 MED ORDER — POTASSIUM 99 MG PO TABS: 99.0000 mg | ORAL_TABLET | ORAL | Status: DC

## 2016-01-14 MED ORDER — METRONIDAZOLE IN NACL 5-0.79 MG/ML-% IV SOLN
500.0000 mg | Freq: Three times a day (TID) | INTRAVENOUS | Status: DC
  Administered 2016-01-14 – 2016-01-16 (×6): 500 mg via INTRAVENOUS
  Filled 2016-01-14 (×9): qty 100

## 2016-01-14 MED ORDER — PANTOPRAZOLE SODIUM 40 MG IV SOLR
40.0000 mg | Freq: Once | INTRAVENOUS | Status: AC
  Administered 2016-01-14: 40 mg via INTRAVENOUS
  Filled 2016-01-14: qty 40

## 2016-01-14 MED ORDER — INSULIN ASPART 100 UNIT/ML ~~LOC~~ SOLN: 0.0000 [IU] | Freq: Every day | SUBCUTANEOUS | Status: DC

## 2016-01-14 MED ORDER — IOHEXOL 350 MG/ML SOLN: 100.0000 mL | Freq: Once | INTRAVENOUS | Status: DC | PRN

## 2016-01-14 MED ORDER — HEPARIN (PORCINE) IN NACL 100-0.45 UNIT/ML-% IJ SOLN
2150.0000 [IU]/h | INTRAMUSCULAR | Status: DC
  Administered 2016-01-14: 1800 [IU]/h via INTRAVENOUS
  Administered 2016-01-14: 1950 [IU]/h via INTRAVENOUS
  Administered 2016-01-15: 2150 [IU]/h via INTRAVENOUS
  Filled 2016-01-14 (×3): qty 250

## 2016-01-14 MED ORDER — INSULIN ASPART PROT & ASPART (70-30 MIX) 100 UNIT/ML ~~LOC~~ SUSP
5.0000 [IU] | Freq: Two times a day (BID) | SUBCUTANEOUS | Status: DC
  Administered 2016-01-14 – 2016-01-16 (×4): 5 [IU] via SUBCUTANEOUS
  Filled 2016-01-14: qty 10

## 2016-01-14 NOTE — ED Notes (Signed)
Pt transporting to CT  

## 2016-01-14 NOTE — Consult Note (Signed)
[facility] NOTE   MRN : [id number]  Reason for Consult: splenic artery stnosis,  Referring Physician: ED  History of Present Illness: 14 ny/o female with < 12 hour history of N/V left UQ pain that radiates to the low back.   The pain has gotten better with pain medication, but she is still nauseous and vomiting.   She reports no history of DVT or emboli.  Past medical history includes hypertention managed with Atenolol, DM type I managed with insulin, and hyperlipidemia managed with Lipitor.     Current Facility-Administered Medications  Medication Dose Route Frequency Provider Last Rate Last Dose  . 0.9 %  sodium chloride infusion  1,000 mL Intravenous Continuous Delora Fuel, MD 0000000 mL/hr at 01/14/16 0730 1,000 mL at 01/14/16 0730   Current Outpatient Prescriptions  Medication Sig Dispense Refill  . albuterol (PROVENTIL HFA;VENTOLIN HFA) 108 (90 BASE) MCG/ACT inhaler Inhale 2 puffs into the lungs every 4 (four) hours as needed. For shortness of breath.    Marland Kitchen albuterol (PROVENTIL) (2.5 MG/3ML) 0.083% nebulizer solution Take 2.5 mg by nebulization at bedtime.     . Alogliptin-Metformin HCl 12.03-999 MG TABS Take 1 tablet by mouth 2 (two) times daily.    Marland Kitchen aspirin 81 MG chewable tablet Chew 81 mg by mouth daily.    Marland Kitchen atenolol (TENORMIN) 50 MG tablet Take 25 mg by mouth 2 (two) times daily.     . cholecalciferol (VITAMIN D) 1000 units tablet Take 1,000 Units by mouth 2 (two) times daily.    . furosemide (LASIX) 40 MG tablet Take 40 mg by mouth daily.     Marland Kitchen ibuprofen (ADVIL,MOTRIN) 200 MG tablet Take 600 mg by mouth every 6 (six) hours as needed for headache or mild pain.    Marland Kitchen insulin NPH-regular (NOVOLIN 70/30) (70-30) 100 UNIT/ML injection Inject 6-16 Units into the skin 2 (two) times daily with a meal.    . loperamide (IMODIUM) 2 MG capsule Take 2 mg by mouth daily as needed for diarrhea or loose stools.    . lovastatin (MEVACOR) 20 MG tablet Take 10  mg by mouth at bedtime.    . naproxen sodium (ANAPROX) 220 MG tablet Take 220 mg by mouth 2 (two) times daily as needed (pain).    . nitroGLYCERIN (NITROSTAT) 0.4 MG SL tablet Place 1 tablet (0.4 mg total) under the tongue every 5 (five) minutes x 3 doses as needed for chest pain. 25 tablet 6  . Potassium 99 MG TABS Take 99 mg by mouth every morning.    Marland Kitchen spironolactone (ALDACTONE) 25 MG tablet Take 50 mg by mouth daily.       Pt meds include: Statin :Yes Betablocker: Yes ASA: Yes Other anticoagulants/antiplatelets: none  Past Medical History  Diagnosis Date  . Hypertension   . Hyperlipidemia   . COPD (chronic obstructive pulmonary disease) ([place]) 04/20/12  . Asthma   . Type II diabetes mellitus ([place])   . Anemia   . H/O hiatal hernia   . Osteoarthritis   . Pneumonia 04/2012  . Migraines   . Panic attacks 04/20/12  . Morbid obesity [facility])     Past Surgical History  Procedure Laterality Date  . Laceration repair  1966    "right upper arm"  . Dilation and curettage of uterus  1990's  . Finger reattachment  1973    "right ring finger"    Social History Social History  Substance Use Topics  . Smoking status: Former  Smoker -- 0.20 packs/day for 40 years    Types: Cigarettes    Quit date: 04/20/2012  . Smokeless tobacco: Never Used     Comment: 6/5//13 "a pack of cigarettes would last me 1 - 1 1/2 wk"  . Alcohol Use: No    Family History Family History  Problem Relation Age of Onset  . Allergies    . Hypertension    . Coronary artery disease Mother 2  . Stroke Father   . COPD Father     Allergies  Allergen Reactions  . Codeine Hives, Itching and Other (See Comments)    "breathing problems"  . Latex Itching     REVIEW OF SYSTEMS  General: [ ]  Weight loss, [ ]  Fever, [ ]  chills Neurologic: [ ]  Dizziness, [ ]  Blackouts, [ ]  Seizure [ ]  Stroke, [ ]  "Mini stroke", [ ]  Slurred speech, [ ]  Temporary blindness; [ ]  weakness in arms or legs, [ ]  Hoarseness [ ]   Dysphagia Cardiac: [ ]  Chest pain/pressure, [ ]  Shortness of breath at rest [x ] Shortness of breath with exertion, [ ]  Atrial fibrillation or irregular heartbeat  Vascular: [ ]  Pain in legs with walking, [ ]  Pain in legs at rest, [ ]  Pain in legs at night,  [ ]  Non-healing ulcer, [ ]  Blood clot in vein/DVT,   Pulmonary: [ ]  Home oxygen, [ ]  Productive cough, [ ]  Coughing up blood, [ x] Asthma,  [ ]  Wheezing [x ] COPD Musculoskeletal:  [ ]  Arthritis, [ ]  Low back pain, [ ]  Joint pain Hematologic: [ ]  Easy Bruising, [ ]  Anemia; [ ]  Hepatitis Gastrointestinal: [ ]  Blood in stool, [ ]  Gastroesophageal Reflux/heartburn, [x]  N/V Urinary: [ ]  chronic Kidney disease, [ ]  on HD - [ ]  MWF or [ ]  TTHS, [ ]  Burning with urination, [ ]  Difficulty urinating Skin: [ ]  Rashes, [ ]  Wounds Psychological: [ ]  Anxiety, [ ]  Depression  Physical Examination Filed Vitals:   01/14/16 0525 01/14/16 0526 01/14/16 0830 01/14/16 0845  BP: 153/91  159/75 157/75  Pulse: 78  78 70  Temp:      TempSrc:      Resp:      Height:      Weight:      SpO2: 97% 97% 98% 99%   Body mass index is 48.26 kg/(m^2).  General:  WDWN in NAD, obese Gait: Normal HENT: WNL Pulmonary: normal non-labored breathing , without Rales, rhonchi,  wheezing Cardiac: RRR, without  Murmurs, rubs or gallops; No carotid bruits Abdomen: soft, slight LUQ tenderness, no masses Skin: no rashes, ulcers noted;  no Gangrene , no cellulitis; no open wounds;   Vascular Exam/Pulses:Palpable radial and DP pulses bilaterally.  Brawny left ankle skin changes.   Musculoskeletal: no muscle wasting or atrophy; no edema  Neurologic: A&O X 3; Appropriate Affect ;  SENSATION: normal; MOTOR FUNCTION: 5/5 Symmetric Speech is fluent/normal   Significant Diagnostic Studies: CBC Lab Results  Component Value Date   WBC 17.5* 01/14/2016   HGB 13.1 01/14/2016   HCT 40.5 01/14/2016   MCV 87.1 01/14/2016   PLT 288 01/14/2016    BMET    Component  Value Date/Time   NA 132* 01/14/2016 0505   K 4.5 01/14/2016 0505   CL 97* 01/14/2016 0505   CO2 22 01/14/2016 0505   GLUCOSE 269* 01/14/2016 0505   BUN 18 01/14/2016 0505   CREATININE 0.94 01/14/2016 0505   CALCIUM 10.6* 01/14/2016 0505   GFRNONAA >  60 01/14/2016 0505   GFRAA >60 01/14/2016 0505   Estimated Creatinine Clearance: 88.3 mL/min (by C-G formula based on Cr of 0.94).  COAG Lab Results  Component Value Date   INR 0.91 06/15/2012     Non-Invasive Vascular Imaging:  CT abd/pelvis IMPRESSION: 1. 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 While there is underlying atherosclerotic vascular disease, there is no irregular atherosclerotic plaque or wall adherent mural thrombus to suggesting an embolic source.       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  Pain is managed with medication, N/V with Zofran.  Dr. Trula Slade has agreed with starting heparin.     Pending further work up for source of the thrombus.   Laurence Slate [facility] 01/14/2016 10:25 AM   I agree with the above.  I have seen and examined the patient.  She has left sided abdominal pain secondary to splenic infarct.  Etiology of infarct is unknown.  CTA of the chest abdomen and pelvis were unremarkable for source.  Consider cardiac etiology. Heparin therapy has been initiated.  She may benefit from vaccines given 1/2 of her spleen has infarcted.  Continue work up for source.  Annamarie Major

## 2016-01-14 NOTE — ED Notes (Signed)
Patient with abdominal pain with nausea and vomiting that started last night at 11pm.  Patient states she has been taking tums last night with some relief, but now she has no relief.  Patient is pale and having hot flashes.

## 2016-01-14 NOTE — Progress Notes (Signed)
Per Dr. Earleen Newport ok to inject 137mL omni 350 for CTA exam. Patient had 126mL omni 300 earlier in the day for previous CT. Dr. Marily Memos will have patient hydrated after scan.

## 2016-01-14 NOTE — Progress Notes (Signed)
Pharmacy Antibiotic Note  Margaret [name] is a 60 y.o. female admitted on 01/14/2016 with intra-abdominal infxn.  Pharmacy has been consulted for cipro dosing. PT is afebrile but WBC is elevated at 17.5. Scr is WNL.   Plan: - Cipro 400mg  IV Q12H - F/u renal fxn, C&S, clinical status   Height: 5\' 5"  (165.1 cm) Weight: 290 lb (131.543 kg) IBW/kg (Calculated) : 57  Temp (24hrs), Avg:97.8 F (36.6 C), Min:97.8 F (36.6 C), Max:97.8 F (36.6 C)   Recent Labs Lab 01/14/16 0505  WBC 17.5*  CREATININE 0.94    Estimated Creatinine Clearance: 88.3 mL/min (by C-G formula based on Cr of 0.94).    Allergies  Allergen Reactions  . Codeine Hives, Itching and Other (See Comments)    "breathing problems"  . Latex Itching    Antimicrobials this admission: Cipro 2/21>> Flagyl 2/21>>  Dose adjustments this admission: N/A  Microbiology results: Pending  Thank you for allowing pharmacy to be a part of this patient's care.  Jaidan Stachnik, Rande Lawman 01/14/2016 11:47 AM

## 2016-01-14 NOTE — H&P (Signed)
[facility] History and Physical  Margaret [name] JXB:[id number] DOB: [birth year] DOA: 01/14/2016  Referring physician: Dr Dalene Seltzer -  [facility] PCP: Dr Clemmie Krill  - unknown practice  Chief Complaint: NV abd pain  HPI: Margaret [name] is a 60 y.o. female  Left upper quadrant abdominal pain. Started approximately 12 hours ago. Sudden onset. Constant with waxing and waning nature. Associated with nausea and vomiting. Worse and patient tries to eat. Improves after emesis. Patient never had these symptoms before. No change with body movement or bowel movement. Denies any dysuria frequency, fever, neck stiffness, rash. No recent antibiotics.  Patient does not drink alcohol at all.    Review of Systems:  Constitutional:  No weight loss, night sweats, Fevers, chills, fatigue.  HEENT:  No headaches, Difficulty swallowing,Tooth/dental problems,Sore throat, Cardio-vascular:  No chest pain, Orthopnea, PND, swelling in lower extremities, anasarca, dizziness, palpitations  GI: {Per HPi Resp:   No shortness of breath with exertion or at rest. No excess mucus, no productive cough, No non-productive cough, No coughing up of blood.No change in color of mucus.No wheezing.No chest wall deformity  Skin:  no rash or lesions.  GU:  no dysuria, change in color of urine, no urgency or frequency. No flank pain.  Musculoskeletal:   No joint pain or swelling. No decreased range of motion. No back pain.  Psych:  No change in mood or affect. No depression or anxiety. No memory loss.  Neuro:  No change in sensation, unilateral strength, or cognitive abilities  All other systems were reviewed and are negative.  Past Medical History  Diagnosis Date  . Hypertension   . Hyperlipidemia   . COPD (chronic obstructive pulmonary disease) (HCC) 04/20/12  . Asthma   . Type II diabetes mellitus (HCC)   . Anemia   . H/O hiatal hernia   . Osteoarthritis   . Pneumonia 04/2012  . Migraines   .  Panic attacks 04/20/12  . Morbid obesity [facility])    Past Surgical History  Procedure Laterality Date  . Laceration repair  1966    "right upper arm"  . Dilation and curettage of uterus  1990's  . Finger reattachment  1973    "right ring finger"   Social History:  reports that she quit smoking about 3 years ago. Her smoking use included Cigarettes. She has a 8 pack-year smoking history. She has never used smokeless tobacco. She reports that she does not drink alcohol or use illicit drugs.  Allergies  Allergen Reactions  . Codeine Hives, Itching and Other (See Comments)    "breathing problems"  . Latex Itching    Family History  Problem Relation Age of Onset  . Allergies    . Hypertension    . Coronary artery disease Mother 38  . Stroke Father   . COPD Father      Prior to Admission medications   Medication Sig Start Date End Date Taking? Authorizing Provider  albuterol (PROVENTIL HFA;VENTOLIN HFA) 108 (90 BASE) MCG/ACT inhaler Inhale 2 puffs into the lungs every 4 (four) hours as needed. For shortness of breath. 04/30/12  Yes Shanker Levora Dredge, MD  albuterol (PROVENTIL) (2.5 MG/3ML) 0.083% nebulizer solution Take 2.5 mg by nebulization at bedtime.    Yes Historical Provider, MD  Alogliptin-Metformin HCl 12.03-999 MG TABS Take 1 tablet by mouth 2 (two) times daily.   Yes Historical Provider, MD  aspirin 81 MG chewable tablet Chew 81 mg by mouth daily.   Yes Historical Provider, MD  atenolol (TENORMIN) 50 MG tablet Take 25 mg by mouth 2 (two) times daily.    Yes Historical Provider, MD  cholecalciferol (VITAMIN D) 1000 units tablet Take 1,000 Units by mouth 2 (two) times daily.   Yes Historical Provider, MD  furosemide (LASIX) 40 MG tablet Take 40 mg by mouth daily.    Yes Historical Provider, MD  ibuprofen (ADVIL,MOTRIN) 200 MG tablet Take 600 mg by mouth every 6 (six) hours as needed for headache or mild pain.   Yes Historical Provider, MD  insulin NPH-regular (NOVOLIN 70/30) (70-30)  100 UNIT/ML injection Inject 6-16 Units into the skin 2 (two) times daily with a meal.   Yes Historical Provider, MD  loperamide (IMODIUM) 2 MG capsule Take 2 mg by mouth daily as needed for diarrhea or loose stools.   Yes Historical Provider, MD  lovastatin (MEVACOR) 20 MG tablet Take 10 mg by mouth at bedtime.   Yes Historical Provider, MD  naproxen sodium (ANAPROX) 220 MG tablet Take 220 mg by mouth 2 (two) times daily as needed (pain).   Yes Historical Provider, MD  nitroGLYCERIN (NITROSTAT) 0.4 MG SL tablet Place 1 tablet (0.4 mg total) under the tongue every 5 (five) minutes x 3 doses as needed for chest pain. 06/20/12 01/14/16 Yes Corky Crafts, MD  Potassium 99 MG TABS Take 99 mg by mouth every morning.   Yes Historical Provider, MD  spironolactone (ALDACTONE) 25 MG tablet Take 50 mg by mouth daily.    Yes Historical Provider, MD   Physical Exam: Filed Vitals:   01/14/16 0915 01/14/16 0930 01/14/16 0945 01/14/16 1030  BP: 137/57 153/66 148/69 152/70  Pulse: 74 82 70 75  Temp:      TempSrc:      Resp:    20  Height:      Weight:      SpO2: 99% 90% 96% 94%    Wt Readings from Last 3 Encounters:  01/14/16 131.543 kg (290 lb)  10/09/13 132.949 kg (293 lb 1.6 oz)  06/28/12 134.537 kg (296 lb 9.6 oz)    General:  Appears calm and comfortable Eyes:  PERRL, EOMI, normal lids, iris ENT:  grossly normal hearing, lips & tongue Neck:  no LAD, masses or thyromegaly Cardiovascular:  RRR, III-IV/VI holosystolic murmur. Trace LE edema.  Respiratory:  CTA bilaterally, no w/r/r. Normal respiratory effort. Abdomen:  soft, ntnd, hypoactive BS Skin:  no rash or induration seen on limited exam Musculoskeletal:  grossly normal tone BUE/BLE Psychiatric:  grossly normal mood and affect, speech fluent and appropriate Neurologic:  CN 2-12 grossly intact, moves all extremities in coordinated fashion.          Labs on Admission:  Basic Metabolic Panel:  Recent Labs Lab 01/14/16 0505  NA  132*  K 4.5  CL 97*  CO2 22  GLUCOSE 269*  BUN 18  CREATININE 0.94  CALCIUM 10.6*   Liver Function Tests:  Recent Labs Lab 01/14/16 0505  AST 17  ALT 16  ALKPHOS 78  BILITOT 0.5  PROT 7.3  ALBUMIN 3.3*    Recent Labs Lab 01/14/16 0505  LIPASE 25   No results for input(s): AMMONIA in the last 168 hours. CBC:  Recent Labs Lab 01/14/16 0505  WBC 17.5*  NEUTROABS 15.5*  HGB 13.1  HCT 40.5  MCV 87.1  PLT 288   Cardiac Enzymes: No results for input(s): CKTOTAL, CKMB, CKMBINDEX, TROPONINI in the last 168 hours.  BNP (last 3 results) No results for input(s):  BNP in the last 8760 hours.  ProBNP (last 3 results) No results for input(s): PROBNP in the last 8760 hours.   CREATININE: 0.94 (01/14/16 0505) Estimated creatinine clearance - 88.3 mL/min  CBG: No results for input(s): GLUCAP in the last 168 hours.  Radiological Exams on Admission: Ct Abdomen Pelvis W Contrast  01/14/2016  CLINICAL DATA:  60 year old female with left-sided abdominal pain, nausea, vomiting and constipation EXAM: CT ABDOMEN AND PELVIS WITH CONTRAST TECHNIQUE: Multidetector CT imaging of the abdomen and pelvis was performed using the standard protocol following bolus administration of intravenous contrast. CONTRAST:  OMNIPAQUE IOHEXOL 300 MG/ML  SOLN COMPARISON:  Prior CT scan of the chest including the lung bases 06/18/2012 FINDINGS: Lower Chest: Respiratory motion limits evaluation for small pulmonary nodules. Mild mosaic attenuation in the lower lobes may represent areas of subsegmental atelectasis or air trapping. Cardiomegaly. Suggestion of contrast material within the inter atrial septum. Evaluation is limited by non gated technique but there could be a patent foramen ovale. Trace calcification of the mitral valve annulus. No evidence of pericardial effusion. Unremarkable visualized esophagus. Abdomen: Unremarkable CT appearance of the stomach, duodenum, adrenal glands and pancreas.  There is significantly decreased attenuation in the upper pole of the spleen with a sharp geographic cut off in the mid aspect of the spleen. Overall, the upper 50% of the splenic parenchyma is involved. These imaging findings are incompletely imaged but appear to persist on delayed imaging. This is concerning for splenic infarct. Mildly abnormal hepatic contour with blunting of the tip of the lateral segment. Additionally, the liver is diffusely low in attenuation consistent with hepatic steatosis. No discrete lesion is identified. The portal veins remain patent. Mildly prominent upper abdominal (gastrohepatic ligament, hepatic duodenum ligament) lymph nodes measuring up to 11 mm (image 26 series 2). Portacaval lymph node measures up to 17 mm in short axis on image 36 of series 2. Unremarkable appearance of the bilateral kidneys. No focal solid lesion, hydronephrosis or nephrolithiasis. Whole 0.9 cm simple cyst in the interpolar left kidney. 1.8 cm simple cyst in the lower pole of the left kidney. There are a few scattered colonic diverticula but no evidence of active diverticulitis. The terminal ileum is unremarkable. The appendix is not visualized and is presumed surgically absent. There is no inflammatory change in the right lower quadrant. No focal bowel wall thickening or evidence of obstruction. No free fluid. Pelvis: 1.8 cm low-attenuation cystic lesion in the left ovary. The uterus is unremarkable. The right ovary is unremarkable. Normal appearance of the bladder. No free fluid or suspicious adenopathy. Musculoskeletal: Omental fat containing periumbilical hernia. No acute fracture or aggressive appearing lytic or blastic osseous lesion. Chronic L5 pars defects bilaterally. Grade 1 anterolisthesis of L5 on S1 measuring approximately 4 mm. Vascular: Atherosclerotic vascular disease without significant stenosis or aneurysmal dilatation. IMPRESSION: 1. 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 While  there is underlying atherosclerotic vascular disease, there is no irregular atherosclerotic plaque or wall adherent mural thrombus to suggesting an embolic source. Does the patient have a history of atrial fibrillation? Additionally, there is a suggestion of a possible ASD or patent foramen ovale although this could easily be artifactual given the non cardiac gated nature of the study. Regardless, further evaluation with an echocardiographic bubble study to assess for right to left shunting is recommended. 2. Cardiomegaly. 3. Advanced hepatic steatosis with early morphologic cirrhotic change. Recommend correlation with serum LFTs. If there is evidence of transaminitis, recommend non emergent outpatient  referral to hepatology/gastroenterology. 4. Prominent upper abdominal lymph nodes are favored to be reactive and related to the underlying liver disease. 5. Incidentally noted 18 mm simple appearing cystic lesion in the left ovary. This is almost certainly benign, but follow up ultrasound is recommended in 1 year according to the Society of Radiologists in Ultrasound2010 Consensus Conference Statement (D Lenis Noon et al. Management of Asymptomatic Ovarian and Other Adnexal Cysts Imaged at [place]: Society of Radiologists in Ultrasound Consensus Conference Statement 2010. Radiology 256 (Sept 2010): 943-954.). 6. Omental fat containing periumbilical hernia. 7. Chronic bilateral L5 pars defects with grade 1 anterolisthesis of L5 on S1. These results were called by telephone at the time of interpretation on 01/14/2016 at 8:42 am to Dr. Dalene Seltzer, who verbally acknowledged these results. Electronically Signed   By: Malachy Moan M.D.   On: 01/14/2016 08:43      Assessment/Plan Active Problems:   COPD (chronic obstructive pulmonary disease) (HCC)   Splenic infarction   Diabetes mellitus with complication (HCC)   Essential hypertension   Hepatic cirrhosis (HCC)   Nausea & vomiting  N/V/abd pain: Likely secondary to  acute splenic infarction. As noted on CT this encompasses approximately 50% of the spleen, upper half. No direct evidence of clot though some atherosclerotic plaques noted. CT scan also notes possible PFO versus ASD. No history of clotting. WBC 17.5 w/ L shift.  - Telemetry - Heparin drip - CTA chest abdomen pelvis - Echo complete with bubble study - Lower extremity Dopplers - Hypercoagulable workup prior to starting heparin if possible - f/u Vascular recommendations - Cipro/Flagyl - zofran, IVF - Lactic acid  Hepatic cirrhosis: CT showing advanced Hepatic Steatosis and early Cirrhosis. Pt denies any ETOH use or drug use. Suspect obesity/HLD/hyperglycemia related. Pt is on spironolactone and Lasix which she states is purely for swelling in her legs. Denies ever being told that she has any liver disease. - continue spironolactone and lasix and atenolol - Hepatitis panel - +/- GI consult  DM: Glucose 269 on admission - Hold orals - A1c - continue 70/30 - SSI  HTN: - continue atenolol  COPD: no home O2. At baseline. Former smoker - continue albuterol PRN     Code Status: FULL  DVT Prophylaxis: Heparin drip Family Communication: Husband - Chrissie Noa Disposition Plan: Pending Improvement    Cybele Maule Shela Commons, MD Family Medicine [facility] [email or web address] Password [email or web address]

## 2016-01-14 NOTE — Progress Notes (Signed)
ANTICOAGULATION CONSULT NOTE - f/u Consult  Pharmacy Consult for heparin Indication: splenic artery infarct  Allergies  Allergen Reactions  . Codeine Hives, Itching and Other (See Comments)    "breathing problems"  . Latex Itching    Patient Measurements: Height: 5\' 5"  (165.1 cm) Weight: 285 lb 15 oz (129.7 kg) IBW/kg (Calculated) : 57 Heparin Dosing Weight: 89.3kg  Vital Signs: Temp: 98 F (36.7 C) (02/21 2003) Temp Source: Oral (02/21 2003) BP: 117/64 mmHg (02/21 2003) Pulse Rate: 58 (02/21 2003)  Labs:  Recent Labs  01/14/16 0505 01/14/16 1838  HGB 13.1  --   HCT 40.5  --   PLT 288  --   HEPARINUNFRC  --  0.23*  CREATININE 0.94  --     Estimated Creatinine Clearance: 87.6 mL/min (by C-G formula based on Cr of 0.94).   Medical History: Past Medical History  Diagnosis Date  . Hypertension   . Hyperlipidemia   . COPD (chronic obstructive pulmonary disease) ([place]) 04/20/12  . Asthma   . Type II diabetes mellitus ([place])   . Anemia   . H/O hiatal hernia   . Osteoarthritis   . Pneumonia 04/2012  . Migraines   . Panic attacks 04/20/12  . Morbid obesity ([place])   . Heart murmur     Medications:  Infusions:  . sodium chloride Stopped (01/14/16 1232)  . sodium chloride 75 mL/hr at 01/14/16 1600  . heparin 1,800 Units/hr (01/14/16 1157)    Assessment: 59 yof presented to the ED with nausea, vomiting and abdominal pain. 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 Hypercoagulable panel to be drawn prior to starting heparin.  Baseline CBC is WNL and she is not on anticoagulation PTA. In the past she has required high dose heparin.   Heparin level 0.23 slightly < goal.  Goal of Therapy:  Heparin level 0.3-0.7 units/ml Monitor platelets by anticoagulation protocol: Yes   Plan:  Increase IV heparin to 1950 units/hr Heparin level and CBC daily   Bradley Handyside S. Alford Highland, PharmD, BCPS Clinical Staff Pharmacist Pager  [phone]  Eilene Ghazi Stillinger 01/14/2016,8:55 PM

## 2016-01-14 NOTE — Progress Notes (Signed)
Preliminary results by tech - Venous Duplex Lower Ext Competed. Negative for deep and superficial vein thrombosis in both legs.  Robinette Esters, BS, RDMS, RVT  

## 2016-01-14 NOTE — ED Notes (Signed)
Patient transported to CT 

## 2016-01-14 NOTE — Progress Notes (Signed)
ANTICOAGULATION CONSULT NOTE - Initial Consult  Pharmacy Consult for heparin Indication: splenic artery infarct  Allergies  Allergen Reactions  . Codeine Hives, Itching and Other (See Comments)    "breathing problems"  . Latex Itching    Patient Measurements: Height: 5\' 5"  (165.1 cm) Weight: 290 lb (131.543 kg) IBW/kg (Calculated) : 57 Heparin Dosing Weight: 89.3kg  Vital Signs: Temp: 97.8 F (36.6 C) (02/21 0457) Temp Source: Oral (02/21 0457) BP: 152/70 mmHg (02/21 1030) Pulse Rate: 75 (02/21 1030)  Labs:  Recent Labs  01/14/16 0505  HGB 13.1  HCT 40.5  PLT 288  CREATININE 0.94    Estimated Creatinine Clearance: 88.3 mL/min (by C-G formula based on Cr of 0.94).   Medical History: Past Medical History  Diagnosis Date  . Hypertension   . Hyperlipidemia   . COPD (chronic obstructive pulmonary disease) ([place]) 04/20/12  . Asthma   . Type II diabetes mellitus ([place])   . Anemia   . H/O hiatal hernia   . Osteoarthritis   . Pneumonia 04/2012  . Migraines   . Panic attacks 04/20/12  . Morbid obesity ([place])     Medications:  Infusions:  . sodium chloride 1,000 mL (01/14/16 0730)  . sodium chloride    . ciprofloxacin    . heparin    . metronidazole      Assessment: 5 yof presented to the ED with nausea, vomiting and abdominal pain. 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 Hypercoagulable panel to be drawn prior to starting heparin.  Baseline CBC is WNL and she is not on anticoagulation PTA. In the past she has required high dose heparin.   Goal of Therapy:  Heparin level 0.3-0.7 units/ml Monitor platelets by anticoagulation protocol: Yes   Plan:  - Heparin bolus 5000 units IV x 1 - Heparin gtt 1800 units/hr - Check a 6 hour heparin level - Daily heparin level and CBC  Davieon Stockham, Rande Lawman 01/14/2016,11:49 AM

## 2016-01-14 NOTE — Progress Notes (Signed)
  Echocardiogram 2D Echocardiogram with Bubble Study has been performed.  Darlina Sicilian M 01/14/2016, 12:38 PM

## 2016-01-14 NOTE — ED Provider Notes (Signed)
CSN: [id number]     Arrival date & time 01/14/16  0447 History   First MD Initiated Contact with Patient 01/14/16 0515     Chief Complaint  Patient presents with  . Abdominal Pain     (Consider location/radiation/quality/duration/timing/severity/associated sxs/prior Treatment) Patient is a 60 y.o. female presenting with abdominal pain. The history is provided by the patient.  Abdominal Pain She had onset about midnight of nausea and vomiting. This was followed by left mid and upper abdominal pain with radiation of the back. Pain is severe and she rates it at 10/10. She has continued to have nausea and vomiting. Pain does subside briefly after vomiting. She thought she had to have a bowel movement and she did force a small bowel movement with some slight relief of pain. However, pain recurred quickly. She's never had pain like this before. It does not seem to matter what position she is in.  Past Medical History  Diagnosis Date  . Hypertension   . Hyperlipidemia   . COPD (chronic obstructive pulmonary disease) ([place]) 04/20/12  . Asthma   . Type II diabetes mellitus ([place])   . Anemia   . H/O hiatal hernia   . Osteoarthritis   . Pneumonia 04/2012  . Migraines   . Panic attacks 04/20/12  . Morbid obesity [facility])    Past Surgical History  Procedure Laterality Date  . Laceration repair  1966    "right upper arm"  . Dilation and curettage of uterus  1990's  . Finger reattachment  1973    "right ring finger"   Family History  Problem Relation Age of Onset  . Allergies    . Hypertension    . Coronary artery disease Mother 108  . Stroke Father   . COPD Father    Social History  Substance Use Topics  . Smoking status: Former Smoker -- 0.20 packs/day for 40 years    Types: Cigarettes    Quit date: 04/20/2012  . Smokeless tobacco: Never Used     Comment: 6/5//13 "a pack of cigarettes would last me 1 - 1 1/2 wk"  . Alcohol Use: No   OB History    No data available     Review of  Systems  Gastrointestinal: Positive for abdominal pain.  All other systems reviewed and are negative.     Allergies  Codeine and Latex  Home Medications   Prior to Admission medications   Medication Sig Start Date End Date Taking? Authorizing Provider  albuterol (PROVENTIL HFA;VENTOLIN HFA) 108 (90 BASE) MCG/ACT inhaler Inhale 2 puffs into the lungs every 4 (four) hours as needed. For shortness of breath. 04/30/12   Shanker Kristeen Mans, MD  albuterol (PROVENTIL) (2.5 MG/3ML) 0.083% nebulizer solution Take 2.5 mg by nebulization every 6 (six) hours as needed for wheezing or shortness of breath.    Historical Provider, MD  aspirin 81 MG chewable tablet Chew 81 mg by mouth daily.    Historical Provider, MD  atenolol (TENORMIN) 50 MG tablet Take 50 mg by mouth daily.    Historical Provider, MD  atorvastatin (LIPITOR) 20 MG tablet Take 20 mg by mouth at bedtime.    Historical Provider, MD  Fluticasone-Salmeterol (ADVAIR) 250-50 MCG/DOSE AEPB Inhale 1 puff into the lungs 2 (two) times daily.    Historical Provider, MD  furosemide (LASIX) 40 MG tablet Take 40 mg by mouth daily.     Historical Provider, MD  HYDROcodone-acetaminophen (NORCO/VICODIN) 5-325 MG per tablet Take 1 tablet by mouth every  6 (six) hours as needed for moderate pain. 10/09/13   Milton Ferguson, MD  ibuprofen (ADVIL,MOTRIN) 200 MG tablet Take 600 mg by mouth every 6 (six) hours as needed for headache or mild pain.    Historical Provider, MD  insulin NPH-regular (NOVOLIN 70/30) (70-30) 100 UNIT/ML injection Inject 6-16 Units into the skin 2 (two) times daily with a meal.    Historical Provider, MD  loperamide (IMODIUM) 2 MG capsule Take 2 mg by mouth daily as needed for diarrhea or loose stools.    Historical Provider, MD  metFORMIN (GLUCOPHAGE) 1000 MG tablet Take 1,000 mg by mouth 2 (two) times daily with a meal.    Historical Provider, MD  nitroGLYCERIN (NITROSTAT) 0.4 MG SL tablet Place 1 tablet (0.4 mg total) under the tongue  every 5 (five) minutes x 3 doses as needed for chest pain. 06/20/12 06/20/13  Jettie Booze, MD  spironolactone (ALDACTONE) 25 MG tablet Take 25 mg by mouth daily.    Historical Provider, MD   BP 133/105 mmHg  Pulse 82  Temp(Src) 97.8 F (36.6 C) (Oral)  Resp 17  Ht 5\' 5"  (1.651 m)  Wt 290 lb (131.543 kg)  BMI 48.26 kg/m2  SpO2 92% Physical Exam  Nursing note and vitals reviewed.  Obese 60 year old female, in obvious pain, but in no acute distress. Vital signs are significant for hypertension. Oxygen saturation is 92%, which is normal. Head is normocephalic and atraumatic. PERRLA, EOMI. Oropharynx is clear. Neck is nontender and supple without adenopathy or JVD. Back is nontender and there is no CVA tenderness. Lungs are clear without rales, wheezes, or rhonchi. Chest is nontender. Heart has regular rate and rhythm without murmur. Abdomen is soft, flat, with mild right upper quadrant and epigastric tenderness and moderate to severe left upper quadrant tenderness. There is no rebound or guarding. There are no masses or hepatosplenomegaly and peristalsis is hypoactive. Extremities have 1+ edema with moderate venous stasis changes, full range of motion is present. Skin is warm and dry without rash. Neurologic: Mental status is normal, cranial nerves are intact, there are no motor or sensory deficits.  ED Course  Procedures (including critical care time) Labs Review Results for orders placed or performed during the hospital encounter of 01/14/16  Lipase, blood  Result Value Ref Range   Lipase 25 11 - 51 U/L  Comprehensive metabolic panel  Result Value Ref Range   Sodium 132 (L) 135 - 145 mmol/L   Potassium 4.5 3.5 - 5.1 mmol/L   Chloride 97 (L) 101 - 111 mmol/L   CO2 22 22 - 32 mmol/L   Glucose, Bld 269 (H) 65 - 99 mg/dL   BUN 18 6 - 20 mg/dL   Creatinine, Ser 0.94 0.44 - 1.00 mg/dL   Calcium 10.6 (H) 8.9 - 10.3 mg/dL   Total Protein 7.3 6.5 - 8.1 g/dL   Albumin 3.3 (L)  3.5 - 5.0 g/dL   AST 17 15 - 41 U/L   ALT 16 14 - 54 U/L   Alkaline Phosphatase 78 38 - 126 U/L   Total Bilirubin 0.5 0.3 - 1.2 mg/dL   GFR calc non Af Amer >60 >60 mL/min   GFR calc Af Amer >60 >60 mL/min   Anion gap 13 5 - 15  CBC  Result Value Ref Range   WBC 17.5 (H) 4.0 - 10.5 K/uL   RBC 4.65 3.87 - 5.11 MIL/uL   Hemoglobin 13.1 12.0 - 15.0 g/dL   HCT 40.5  36.0 - 46.0 %   MCV 87.1 78.0 - 100.0 fL   MCH 28.2 26.0 - 34.0 pg   MCHC 32.3 30.0 - 36.0 g/dL   RDW 15.2 11.5 - 15.5 %   Platelets 288 150 - 400 K/uL  Differential  Result Value Ref Range   Neutrophils Relative % 90 %   Neutro Abs 15.5 (H) 1.7 - 7.7 K/uL   Lymphocytes Relative 6 %   Lymphs Abs 1.0 0.7 - 4.0 K/uL   Monocytes Relative 4 %   Monocytes Absolute 0.8 0.1 - 1.0 K/uL   Eosinophils Relative 0 %   Eosinophils Absolute 0.0 0.0 - 0.7 K/uL   Basophils Relative 0 %   Basophils Absolute 0.0 0.0 - 0.1 K/uL   I have personally reviewed and evaluated these images and lab results as part of my medical decision-making.    MDM   Final diagnoses:  Upper abdominal pain    Abdominal pain of uncertain cause. Epigastric tenderness does take me wonder about possible peptic ulcer disease or GERD. Consider diverticulitis. She will be sent for CT of abdomen and pelvis. Old records are reviewed and she has no relevant past visits.  Laboratory workup shows leukocytosis with left shift, normal liver enzymes and lipase. CT is still pending. Case is signed out to Dr. Billy Fischer.  Delora Fuel, [place] [id number]

## 2016-01-15 DIAGNOSIS — K7460 Unspecified cirrhosis of liver: Secondary | ICD-10-CM

## 2016-01-15 DIAGNOSIS — D735 Infarction of spleen: Principal | ICD-10-CM

## 2016-01-15 DIAGNOSIS — J42 Unspecified chronic bronchitis: Secondary | ICD-10-CM

## 2016-01-15 DIAGNOSIS — I10 Essential (primary) hypertension: Secondary | ICD-10-CM

## 2016-01-15 DIAGNOSIS — R112 Nausea with vomiting, unspecified: Secondary | ICD-10-CM

## 2016-01-15 DIAGNOSIS — E118 Type 2 diabetes mellitus with unspecified complications: Secondary | ICD-10-CM

## 2016-01-15 LAB — GLUCOSE, CAPILLARY
GLUCOSE-CAPILLARY: 128 mg/dL — AB (ref 65–99)
Glucose-Capillary: 239 mg/dL — ABNORMAL HIGH (ref 65–99)
Glucose-Capillary: 184 mg/dL — ABNORMAL HIGH (ref 65–99)
Glucose-Capillary: 208 mg/dL — ABNORMAL HIGH (ref 65–99)

## 2016-01-15 LAB — CBC
HCT: 39.1 % (ref 36.0–46.0)
Hemoglobin: 12.1 g/dL (ref 12.0–15.0)
MCH: 28.5 pg (ref 26.0–34.0)
MCHC: 30.9 g/dL (ref 30.0–36.0)
MCV: 92 fL (ref 78.0–100.0)
PLATELETS: 264 10*3/uL (ref 150–400)
RBC: 4.25 MIL/uL (ref 3.87–5.11)
RDW: 15.6 % — AB (ref 11.5–15.5)
WBC: 16.8 10*3/uL — AB (ref 4.0–10.5)

## 2016-01-15 LAB — COMPREHENSIVE METABOLIC PANEL
ALT: 15 U/L (ref 14–54)
AST: 16 U/L (ref 15–41)
Albumin: 2.7 g/dL — ABNORMAL LOW (ref 3.5–5.0)
Alkaline Phosphatase: 78 U/L (ref 38–126)
Anion gap: 6 (ref 5–15)
BUN: 10 mg/dL (ref 6–20)
CHLORIDE: 101 mmol/L (ref 101–111)
CO2: 27 mmol/L (ref 22–32)
CREATININE: 0.88 mg/dL (ref 0.44–1.00)
Calcium: 9.3 mg/dL (ref 8.9–10.3)
Glucose, Bld: 209 mg/dL — ABNORMAL HIGH (ref 65–99)
Potassium: 5 mmol/L (ref 3.5–5.1)
Sodium: 134 mmol/L — ABNORMAL LOW (ref 135–145)
Total Bilirubin: 0.6 mg/dL (ref 0.3–1.2)
Total Protein: 6.5 g/dL (ref 6.5–8.1)

## 2016-01-15 LAB — HEMOGLOBIN A1C
HEMOGLOBIN A1C: 9.1 % — AB (ref 4.8–5.6)
Mean Plasma Glucose: 214 mg/dL

## 2016-01-15 LAB — HEPARIN LEVEL (UNFRACTIONATED): HEPARIN UNFRACTIONATED: 0.21 [IU]/mL — AB (ref 0.30–0.70)

## 2016-01-15 LAB — HEPATITIS PANEL, ACUTE
HCV Ab: <0.1 s/co ratio (ref 0.0–0.9)
HEP A IGM: NEGATIVE
HEP B C IGM: NEGATIVE
Hepatitis B Surface Ag: NEGATIVE

## 2016-01-15 MED ORDER — HAEMOPHILUS B POLYSAC CONJ VAC IM SOLR
0.5000 mL | Freq: Once | INTRAMUSCULAR | Status: AC
  Administered 2016-01-15: 0.5 mL via INTRAMUSCULAR
  Filled 2016-01-15: qty 0.5

## 2016-01-15 MED ORDER — MENINGOCOCCAL VAC A,C,Y,W-135 ~~LOC~~ INJ: 0.5000 mL | INJECTION | Freq: Once | SUBCUTANEOUS | Status: DC

## 2016-01-15 MED ORDER — MENINGOCOCCAL VAC A,C,Y,W-135 ~~LOC~~ INJ
0.5000 mL | INJECTION | Freq: Once | SUBCUTANEOUS | Status: AC
  Administered 2016-01-15: 0.5 mL via SUBCUTANEOUS
  Filled 2016-01-15: qty 0.5

## 2016-01-15 MED ORDER — PNEUMOCOCCAL 13-VAL CONJ VACC IM SUSP
0.5000 mL | Freq: Once | INTRAMUSCULAR | Status: AC
  Administered 2016-01-15: 0.5 mL via INTRAMUSCULAR
  Filled 2016-01-15: qty 0.5

## 2016-01-15 MED ORDER — DOCUSATE SODIUM 100 MG PO CAPS
100.0000 mg | ORAL_CAPSULE | Freq: Two times a day (BID) | ORAL | Status: DC
  Administered 2016-01-15 – 2016-01-16 (×3): 100 mg via ORAL
  Filled 2016-01-15 (×3): qty 1

## 2016-01-15 MED ORDER — RIVAROXABAN 15 MG PO TABS
15.0000 mg | ORAL_TABLET | Freq: Two times a day (BID) | ORAL | Status: DC
  Administered 2016-01-15 – 2016-01-16 (×2): 15 mg via ORAL
  Filled 2016-01-15 (×2): qty 1

## 2016-01-15 NOTE — Progress Notes (Signed)
Patient states she feels like she can eat food and is requesting graham crackers. Notified on-call hospitalist if we can advance diet to carb/Modified. Awaiting orders and will continue to monitor patient to end of shift.

## 2016-01-15 NOTE — Progress Notes (Signed)
ANTICOAGULATION CONSULT NOTE - f/u Consult  Pharmacy Consult for heparin Indication: splenic artery infarct  Allergies  Allergen Reactions  . Codeine Hives, Itching and Other (See Comments)    "breathing problems"  . Latex Itching    Patient Measurements: Height: 5\' 5"  (165.1 cm) Weight: 288 lb (130.636 kg) IBW/kg (Calculated) : 57 Heparin Dosing Weight: 89.3kg  Vital Signs: Temp: 99.2 F (37.3 C) (02/22 0650) Temp Source: Oral (02/22 0650) BP: 125/54 mmHg (02/22 0650) Pulse Rate: 83 (02/22 0650)  Labs:  Recent Labs  01/14/16 0505 01/14/16 1838 01/15/16 0543  HGB 13.1  --  12.1  HCT 40.5  --  39.1  PLT 288  --  264  HEPARINUNFRC  --  0.23* 0.21*  CREATININE 0.94  --   --     Estimated Creatinine Clearance: 87.9 mL/min (by C-G formula based on Cr of 0.94).   Medical History: Past Medical History  Diagnosis Date  . Hypertension   . Hyperlipidemia   . COPD (chronic obstructive pulmonary disease) ([place]) 04/20/12  . Asthma   . Type II diabetes mellitus ([place])   . Anemia   . H/O hiatal hernia   . Osteoarthritis   . Pneumonia 04/2012  . Migraines   . Panic attacks 04/20/12  . Morbid obesity ([place])   . Heart murmur     Medications:  Infusions:  . sodium chloride Stopped (01/14/16 1232)  . sodium chloride 75 mL/hr at 01/15/16 0316  . heparin 1,950 Units/hr (01/14/16 2329)    Assessment: 66 yof presented to the ED with nausea, vomiting and abdominal pain. 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 Hypercoagulable panel to be drawn prior to starting heparin.  Baseline CBC is WNL and she is not on anticoagulation PTA. In the past she has required high dose heparin.   AM heparin level remains subtherapeutic at 0.21 on heparin 1950 units/hr. Nurse reports no issues with infusion or bleeding.  Goal of Therapy:  Heparin level 0.3-0.7 units/ml Monitor platelets by anticoagulation protocol: Yes   Plan:  Increase heparin to 2150  units/hr 6h HL Daily HL Monitor s/sx of bleeding  Andrey Cota. Diona Foley, PharmD, BCPS Clinical Pharmacist Pager [phone] 01/15/2016,6:55 AM

## 2016-01-15 NOTE — Progress Notes (Addendum)
[facility]                                                                              Patient Demographics  Margaret [name], is a 60 y.o. female, DOB - 1955-12-10, ION:[id number]  Admit date - 01/14/2016   Admitting Physician Ozella Rocks, MD  Outpatient Primary MD for the patient is Quentin Mulling, MD  LOS - 1   Chief Complaint  Patient presents with  . Abdominal Pain      HPI on 01/14/2016 by Dr. Shelly Flatten Margaret [name] is a 60 y.o. female  Left upper quadrant abdominal pain. Started approximately 12 hours ago. Sudden onset. Constant with waxing and waning nature. Associated with nausea and vomiting. Worse and patient tries to eat. Improves after emesis. Patient never had these symptoms before. No change with body movement or bowel movement. Denies any dysuria frequency, fever, neck stiffness, rash. No recent antibiotics. Patient does not drink alcohol at all.  Assessment & Plan   Nausea, vomiting, abdominal pain likely secondary to acute splenic infarction -Noted on CT, encompasses approximately 50% of the spleen, upper half. No direct evidence of clot though some atherosclerotic plaques noted on the CT scan. -Patient denies a history of clotting -Lower extremity Doppler negative for DVT -Echocardiogram: EF of 60-65%, grade 2 diastolic dysfunction, no cardiac source of emboli was identified, no ASD or PFO -Vascular surgery consulted and appreciate, recommended cardiac workup -Currently on heparin drip -Continue Cipro/Flagyl -Will order TEE to further evaluate for clot -Hypercoagulable workup pending  Hepatic cirrhosis -CT abdomen showing advanced hepatic steatosis and early cirrhosis -Patient denied alcohol use -Possibly secondary to obesity, hyperlipidemia, hyperglycemia -Continue his prolactin, Lasix, atenolol -Hepatitis panel unremarkable  Diabetes mellitus, type II -Hemoglobin A1c 9.1 -Continue 7030, insulin sliding scale, CBG  monitoring  Essential hypertension -Continue atenolol  COPD -Currently at baseline, former smoker -Continue albuterol as needed  Chronic diastolic heart failure -Currently compensated -Echo as above -Continue diuretics -Monitor daily weights, intake and output  Code Status: Full  Family Communication: None at bedside  Disposition Plan: Admitted  Time Spent in minutes   30 minutes  Procedures  Echocardiogram with bubble study LE doppler  Consults   Vascular surgery Cardiology  DVT Prophylaxis  heparin  Lab Results  Component Value Date   PLT 264 01/15/2016    Medications  Scheduled Meds: . aspirin  81 mg Oral Daily  . atenolol  25 mg Oral BID  . ciprofloxacin  400 mg Intravenous Q12H  . furosemide  40 mg Oral Daily  . insulin aspart  0-20 Units Subcutaneous TID WC  . insulin aspart  0-5 Units Subcutaneous QHS  . insulin aspart protamine- aspart  5 Units Subcutaneous BID WC  . metronidazole  500 mg Intravenous Q8H  . pravastatin  20 mg Oral q1800  . sodium chloride flush  3 mL Intravenous Q12H  . spironolactone  50 mg Oral Daily   Continuous Infusions: . sodium chloride Stopped (01/14/16 1232)  . sodium chloride 75 mL/hr at 01/15/16 0316  . heparin 2,150 Units/hr (01/15/16 1210)   PRN Meds:.acetaminophen **OR** acetaminophen, albuterol, iohexol, morphine injection, ondansetron **OR** ondansetron (ZOFRAN) IV, oxyCODONE  Antibiotics  Anti-infectives    Start     Dose/Rate Route Frequency Ordered Stop   01/14/16 1130  ciprofloxacin (CIPRO) IVPB 400 mg     400 mg 200 mL/hr over 60 Minutes Intravenous Every 12 hours 01/14/16 1113     01/14/16 1115  metroNIDAZOLE (FLAGYL) IVPB 500 mg     500 mg 100 mL/hr over 60 Minutes Intravenous Every 8 hours 01/14/16 1111        Subjective:   Margaret [name] seen and examined today.  Patient complained of constipation. States her last bowel movement was yesterday at 4 AM. Denies any current nausea or  vomiting or abdominal pain. Denies any shortness of breath, chest pain.  Objective:   Filed Vitals:   01/15/16 0021 01/15/16 0644 01/15/16 0650 01/15/16 0900  BP: 114/45  125/54 112/51  Pulse: 74  83 69  Temp: 98 F (36.7 C)  99.2 F (37.3 C) 99.4 F (37.4 C)  TempSrc: Oral  Oral Oral  Resp: 20  20 20   Height:      Weight:  130.636 kg (288 lb)    SpO2: 98%  93% 95%    Wt Readings from Last 3 Encounters:  01/15/16 130.636 kg (288 lb)  10/09/13 132.949 kg (293 lb 1.6 oz)  06/28/12 134.537 kg (296 lb 9.6 oz)     Intake/Output Summary (Last 24 hours) at 01/15/16 1229 Last data filed at 01/15/16 1000  Gross per 24 hour  Intake 3202.92 ml  Output   3000 ml  Net 202.92 ml    Exam  General: Well developed, well nourished, NAD, appears stated age  HEENT: NCAT,mucous membranes moist.   Cardiovascular: S1 S2 auscultated, 2/6 SEM, RRR  Respiratory: Clear to auscultation bilaterally with equal chest rise  Abdomen: Soft, obese, nontender, nondistended, + bowel sounds  Extremities: warm dry without cyanosis clubbing or edema  Neuro: AAOx3, nonfocal  Skin: Without rashes exudates or nodules  Psych: Normal affect and demeanor with intact judgement and insight  Data Review   Micro Results No results found for this or any previous visit (from the past 240 hour(s)).  Radiology Reports Ct Abdomen Pelvis W Contrast  01/14/2016  CLINICAL DATA:  60 year old female with left-sided abdominal pain, nausea, vomiting and constipation EXAM: CT ABDOMEN AND PELVIS WITH CONTRAST TECHNIQUE: Multidetector CT imaging of the abdomen and pelvis was performed using the standard protocol following bolus administration of intravenous contrast. CONTRAST:  OMNIPAQUE IOHEXOL 300 MG/ML  SOLN COMPARISON:  Prior CT scan of the chest including the lung bases 06/18/2012 FINDINGS: Lower Chest: Respiratory motion limits evaluation for small pulmonary nodules. Mild mosaic attenuation in the lower  lobes may represent areas of subsegmental atelectasis or air trapping. Cardiomegaly. Suggestion of contrast material within the inter atrial septum. Evaluation is limited by non gated technique but there could be a patent foramen ovale. Trace calcification of the mitral valve annulus. No evidence of pericardial effusion. Unremarkable visualized esophagus. Abdomen: Unremarkable CT appearance of the stomach, duodenum, adrenal glands and pancreas. There is significantly decreased attenuation in the upper pole of the spleen with a sharp geographic cut off in the mid aspect of the spleen. Overall, the upper 50% of the splenic parenchyma is involved. These imaging findings are incompletely imaged but appear to persist on delayed imaging. This is concerning for splenic infarct. Mildly abnormal hepatic contour with blunting of the tip of the lateral segment. Additionally, the liver is diffusely low in attenuation consistent with hepatic steatosis. No discrete lesion is identified. The  portal veins remain patent. Mildly prominent upper abdominal (gastrohepatic ligament, hepatic duodenum ligament) lymph nodes measuring up to 11 mm (image 26 series 2). Portacaval lymph node measures up to 17 mm in short axis on image 36 of series 2. Unremarkable appearance of the bilateral kidneys. No focal solid lesion, hydronephrosis or nephrolithiasis. Whole 0.9 cm simple cyst in the interpolar left kidney. 1.8 cm simple cyst in the lower pole of the left kidney. There are a few scattered colonic diverticula but no evidence of active diverticulitis. The terminal ileum is unremarkable. The appendix is not visualized and is presumed surgically absent. There is no inflammatory change in the right lower quadrant. No focal bowel wall thickening or evidence of obstruction. No free fluid. Pelvis: 1.8 cm low-attenuation cystic lesion in the left ovary. The uterus is unremarkable. The right ovary is unremarkable. Normal appearance of the bladder. No  free fluid or suspicious adenopathy. Musculoskeletal: Omental fat containing periumbilical hernia. No acute fracture or aggressive appearing lytic or blastic osseous lesion. Chronic L5 pars defects bilaterally. Grade 1 anterolisthesis of L5 on S1 measuring approximately 4 mm. Vascular: Atherosclerotic vascular disease without significant stenosis or aneurysmal dilatation. IMPRESSION: 1. 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 While there is underlying atherosclerotic vascular disease, there is no irregular atherosclerotic plaque or wall adherent mural thrombus to suggesting an embolic source. Does the patient have a history of atrial fibrillation? Additionally, there is a suggestion of a possible ASD or patent foramen ovale although this could easily be artifactual given the non cardiac gated nature of the study. Regardless, further evaluation with an echocardiographic bubble study to assess for right to left shunting is recommended. 2. Cardiomegaly. 3. Advanced hepatic steatosis with early morphologic cirrhotic change. Recommend correlation with serum LFTs. If there is evidence of transaminitis, recommend non emergent outpatient referral to hepatology/gastroenterology. 4. Prominent upper abdominal lymph nodes are favored to be reactive and related to the underlying liver disease. 5. Incidentally noted 18 mm simple appearing cystic lesion in the left ovary. This is almost certainly benign, but follow up ultrasound is recommended in 1 year according to the Society of Radiologists in Ultrasound2010 Consensus Conference Statement (D Lenis Noon et al. Management of Asymptomatic Ovarian and Other Adnexal Cysts Imaged at [place]: Society of Radiologists in Ultrasound Consensus Conference Statement 2010. Radiology 256 (Sept 2010): 943-954.). 6. Omental fat containing periumbilical hernia. 7. Chronic bilateral L5 pars defects with grade 1 anterolisthesis of L5 on S1. These results were called by telephone at the time of  interpretation on 01/14/2016 at 8:42 am to Dr. Dalene Seltzer, who verbally acknowledged these results. Electronically Signed   By: Malachy Moan M.D.   On: 01/14/2016 08:43   Ct Angio Chest Aorta W/cm &/or Wo/cm  01/14/2016  CLINICAL DATA:  60 year old female with lower extremity swelling and shortness of breath. Found have acute splenic infarct on CT Abdomen and Pelvis 0811 hours today. Initial encounter. EXAM: CT ANGIOGRAPHY CHEST, ABDOMEN AND PELVIS TECHNIQUE: Multidetector CT imaging through the chest, abdomen and pelvis was performed using the standard protocol during bolus administration of intravenous contrast. Multiplanar reconstructed images and MIPs were obtained and reviewed to evaluate the vascular anatomy. CONTRAST:  100 mL Omnipaque 350 COMPARISON:  Contrast-enhanced CT Abdomen and Pelvis 811 hours today. Chest CTA 06/18/2012. FINDINGS: CTA CHEST FINDINGS Large body habitus. Lower lung volumes compared to 2013 with widespread pulmonary septal thickening and ground-glass opacity. No pleural effusion. No consolidation. Central airways are patent aside from atelectatic changes. Negative thoracic inlet.  No axillary lymphadenopathy. Chronic mild to moderate mediastinal and hilar lymphadenopathy has not significantly changed since 2013. No pleural or pericardial effusion. Osteopenia and bulky degenerative osteophytosis in the thoracic spine. New since 2013 is erosion with irregular margins of the T8-T9 in plates (sagittal image 96) there appears to be chronic interbody fusion at this level via bulky right antral lateral endplate osteophytes. There is no retropulsion of bone or abnormal paraspinal soft tissue here. No other acute or suspicious osseous lesion in the chest. VASCULAR FINDINGS: Central pulmonary artery enlargement (up to 42 mm diameter main pulmonary artery versus adjacent ascending aorta measuring 33 mm diameter) is stable since 2013. No central or hilar pulmonary artery filling defect.  Thoracic aorta is remarkable for scattered soft and calcified atherosclerotic plaque, most apparent in the arch (series 8, image 109) which is mildly progressed since 2013. Great vessel origins are patent. Calcified coronary artery atherosclerosis is evident. No thoracic aortic dissection or aneurysm. Review of the MIP images confirms the above findings. CTA ABDOMEN AND PELVIS FINDINGS Chronic L5 pars fractures with grade 1 spondylolisthesis. Osteopenia. No acute osseous abnormality identified. Large volume of excreted IV contrast in the urinary bladder which results in streak artifact both through the pelvis. No pelvic free fluid is evident. Visualized pelvic and abdominal viscera appear stable since this morning. Hypo enhancement of the superior half of the spleen re- demonstrated. No abdominal free fluid. Mild upper abdominal lymphadenopathy again noted. VASCULAR FINDINGS: Intermittent soft and calcified atherosclerosis of the abdominal aorta. Major abdominal aortic branches remain patent including the celiac artery and main splenic artery. Major SMA branches appear patent. There does appear to be atherosclerotic stenosis at the IMA origin which remains patent. Calcified plaque continues into the bilateral iliac arteries. The visualized bilateral femoral artery is are patent with mild calcified plaque. No arterial dissection or aneurysm identified. Review of the MIP images confirms the above findings. IMPRESSION: 1. Intermittent soft and calcified aortic atherosclerosis in the chest and abdomen. Major aortic branches including the celiac and main splenic artery remain patent. There does appear to be atherosclerotic stenosis at the IMA origin. 2. Chronic pulmonary artery hypertension, with stable main pulmonary artery enlargement since 2013. 3. Pulmonary ground-glass opacity and septal thickening is nonspecific with top differential considerations of pulmonary edema and atypical infection. No pleural effusion. 4.  Stable visualized abdominal viscera since CT Abdomen and Pelvis this morning, splenic infarct re- demonstrated. 5. New T8-T9 vertebral endplate erosions since 2013. This is nonspecific but benign or degenerative etiology is favored over infectious or malignant process. Thoracic spine MRI (without and with contrast preferred) may characterize further. 6. Chronic nonspecific mediastinal lymphadenopathy, stable since 2013. Electronically Signed   By: Odessa Fleming M.D.   On: 01/14/2016 13:47   Ct Angio Abd/pel W/ And/or W/o  01/14/2016  CLINICAL DATA:  60 year old female with lower extremity swelling and shortness of breath. Found have acute splenic infarct on CT Abdomen and Pelvis 0811 hours today. Initial encounter. EXAM: CT ANGIOGRAPHY CHEST, ABDOMEN AND PELVIS TECHNIQUE: Multidetector CT imaging through the chest, abdomen and pelvis was performed using the standard protocol during bolus administration of intravenous contrast. Multiplanar reconstructed images and MIPs were obtained and reviewed to evaluate the vascular anatomy. CONTRAST:  100 mL Omnipaque 350 COMPARISON:  Contrast-enhanced CT Abdomen and Pelvis 811 hours today. Chest CTA 06/18/2012. FINDINGS: CTA CHEST FINDINGS Large body habitus. Lower lung volumes compared to 2013 with widespread pulmonary septal thickening and ground-glass opacity. No pleural effusion. No consolidation. Central airways  are patent aside from atelectatic changes. Negative thoracic inlet. No axillary lymphadenopathy. Chronic mild to moderate mediastinal and hilar lymphadenopathy has not significantly changed since 2013. No pleural or pericardial effusion. Osteopenia and bulky degenerative osteophytosis in the thoracic spine. New since 2013 is erosion with irregular margins of the T8-T9 in plates (sagittal image 96) there appears to be chronic interbody fusion at this level via bulky right antral lateral endplate osteophytes. There is no retropulsion of bone or abnormal paraspinal soft  tissue here. No other acute or suspicious osseous lesion in the chest. VASCULAR FINDINGS: Central pulmonary artery enlargement (up to 42 mm diameter main pulmonary artery versus adjacent ascending aorta measuring 33 mm diameter) is stable since 2013. No central or hilar pulmonary artery filling defect. Thoracic aorta is remarkable for scattered soft and calcified atherosclerotic plaque, most apparent in the arch (series 8, image 109) which is mildly progressed since 2013. Great vessel origins are patent. Calcified coronary artery atherosclerosis is evident. No thoracic aortic dissection or aneurysm. Review of the MIP images confirms the above findings. CTA ABDOMEN AND PELVIS FINDINGS Chronic L5 pars fractures with grade 1 spondylolisthesis. Osteopenia. No acute osseous abnormality identified. Large volume of excreted IV contrast in the urinary bladder which results in streak artifact both through the pelvis. No pelvic free fluid is evident. Visualized pelvic and abdominal viscera appear stable since this morning. Hypo enhancement of the superior half of the spleen re- demonstrated. No abdominal free fluid. Mild upper abdominal lymphadenopathy again noted. VASCULAR FINDINGS: Intermittent soft and calcified atherosclerosis of the abdominal aorta. Major abdominal aortic branches remain patent including the celiac artery and main splenic artery. Major SMA branches appear patent. There does appear to be atherosclerotic stenosis at the IMA origin which remains patent. Calcified plaque continues into the bilateral iliac arteries. The visualized bilateral femoral artery is are patent with mild calcified plaque. No arterial dissection or aneurysm identified. Review of the MIP images confirms the above findings. IMPRESSION: 1. Intermittent soft and calcified aortic atherosclerosis in the chest and abdomen. Major aortic branches including the celiac and main splenic artery remain patent. There does appear to be atherosclerotic  stenosis at the IMA origin. 2. Chronic pulmonary artery hypertension, with stable main pulmonary artery enlargement since 2013. 3. Pulmonary ground-glass opacity and septal thickening is nonspecific with top differential considerations of pulmonary edema and atypical infection. No pleural effusion. 4. Stable visualized abdominal viscera since CT Abdomen and Pelvis this morning, splenic infarct re- demonstrated. 5. New T8-T9 vertebral endplate erosions since 2013. This is nonspecific but benign or degenerative etiology is favored over infectious or malignant process. Thoracic spine MRI (without and with contrast preferred) may characterize further. 6. Chronic nonspecific mediastinal lymphadenopathy, stable since 2013. Electronically Signed   By: Odessa Fleming M.D.   On: 01/14/2016 13:47    CBC  Recent Labs Lab 01/14/16 0505 01/15/16 0543  WBC 17.5* 16.8*  HGB 13.1 12.1  HCT 40.5 39.1  PLT 288 264  MCV 87.1 92.0  MCH 28.2 28.5  MCHC 32.3 30.9  RDW 15.2 15.6*  LYMPHSABS 1.0  --   MONOABS 0.8  --   EOSABS 0.0  --   BASOSABS 0.0  --     Chemistries   Recent Labs Lab 01/14/16 0505 01/15/16 0543  NA 132* 134*  K 4.5 5.0  CL 97* 101  CO2 22 27  GLUCOSE 269* 209*  BUN 18 10  CREATININE 0.94 0.88  CALCIUM 10.6* 9.3  AST 17 16  ALT 16  15  ALKPHOS 78 78  BILITOT 0.5 0.6   ------------------------------------------------------------------------------------------------------------------ estimated creatinine clearance is 93.9 mL/min (by C-G formula based on Cr of 0.88). ------------------------------------------------------------------------------------------------------------------  Recent Labs  01/14/16 1143  HGBA1C 9.1*   ------------------------------------------------------------------------------------------------------------------ No results for input(s): CHOL, HDL, LDLCALC, TRIG, CHOLHDL, LDLDIRECT in the last 72  hours. ------------------------------------------------------------------------------------------------------------------ No results for input(s): TSH, T4TOTAL, T3FREE, THYROIDAB in the last 72 hours.  Invalid input(s): FREET3 ------------------------------------------------------------------------------------------------------------------ No results for input(s): VITAMINB12, FOLATE, FERRITIN, TIBC, IRON, RETICCTPCT in the last 72 hours.  Coagulation profile No results for input(s): INR, PROTIME in the last 168 hours.  No results for input(s): DDIMER in the last 72 hours.  Cardiac Enzymes No results for input(s): CKMB, TROPONINI, MYOGLOBIN in the last 168 hours.  Invalid input(s): CK ------------------------------------------------------------------------------------------------------------------ Invalid input(s): POCBNP    Ediberto Sens D.O. on 01/15/2016 at 12:29 PM  Between 7am to 7pm - Pager - [phone]  After 7pm go to [email or web address] - password [email or web address]  And look for the night coverage person covering for me after hours  [facility] Office  [phone]

## 2016-01-16 ENCOUNTER — Telehealth: Payer: Self-pay | Admitting: Hematology and Oncology

## 2016-01-16 LAB — BASIC METABOLIC PANEL
ANION GAP: 6 (ref 5–15)
BUN: 8 mg/dL (ref 6–20)
CALCIUM: 9.1 mg/dL (ref 8.9–10.3)
CO2: 28 mmol/L (ref 22–32)
Chloride: 99 mmol/L — ABNORMAL LOW (ref 101–111)
Creatinine, Ser: 0.73 mg/dL (ref 0.44–1.00)
Glucose, Bld: 178 mg/dL — ABNORMAL HIGH (ref 65–99)
Potassium: 4.8 mmol/L (ref 3.5–5.1)
Sodium: 133 mmol/L — ABNORMAL LOW (ref 135–145)

## 2016-01-16 LAB — PROTEIN S ACTIVITY: Protein S Activity: 83 % (ref 63–140)

## 2016-01-16 LAB — GLUCOSE, CAPILLARY
Glucose-Capillary: 178 mg/dL — ABNORMAL HIGH (ref 65–99)
Glucose-Capillary: 188 mg/dL — ABNORMAL HIGH (ref 65–99)

## 2016-01-16 LAB — CBC
HEMATOCRIT: 36.8 % (ref 36.0–46.0)
Hemoglobin: 11.7 g/dL — ABNORMAL LOW (ref 12.0–15.0)
MCH: 29.2 pg (ref 26.0–34.0)
MCHC: 31.8 g/dL (ref 30.0–36.0)
MCV: 91.8 fL (ref 78.0–100.0)
Platelets: 271 10*3/uL (ref 150–400)
RBC: 4.01 MIL/uL (ref 3.87–5.11)
RDW: 15.4 % (ref 11.5–15.5)
WBC: 16.6 10*3/uL — AB (ref 4.0–10.5)

## 2016-01-16 LAB — FACTOR 2 ASSAY: Factor II Activity: 113 % (ref 50–154)

## 2016-01-16 LAB — PROTEIN S, TOTAL: PROTEIN S AG TOTAL: 147 % (ref 60–150)

## 2016-01-16 LAB — PROTEIN C ACTIVITY: Protein C Activity: 86 % (ref 73–180)

## 2016-01-16 LAB — PROTEIN C, TOTAL: Protein C, Total: 66 % (ref 60–150)

## 2016-01-16 MED ORDER — METRONIDAZOLE 500 MG PO TABS
500.0000 mg | ORAL_TABLET | Freq: Three times a day (TID) | ORAL | Status: DC
  Administered 2016-01-16: 500 mg via ORAL
  Filled 2016-01-16: qty 1

## 2016-01-16 MED ORDER — METRONIDAZOLE 500 MG PO TABS: 500.0000 mg | ORAL_TABLET | Freq: Three times a day (TID) | ORAL | Status: DC

## 2016-01-16 MED ORDER — CIPROFLOXACIN HCL 500 MG PO TABS
500.0000 mg | ORAL_TABLET | Freq: Two times a day (BID) | ORAL | Status: DC
  Administered 2016-01-16: 500 mg via ORAL
  Filled 2016-01-16: qty 1

## 2016-01-16 MED ORDER — CIPROFLOXACIN HCL 500 MG PO TABS: 500.0000 mg | ORAL_TABLET | Freq: Two times a day (BID) | ORAL | Status: DC

## 2016-01-16 MED ORDER — RIVAROXABAN (XARELTO) VTE STARTER PACK (15 & 20 MG): ORAL_TABLET | ORAL | Status: DC

## 2016-01-16 NOTE — Progress Notes (Signed)
Orders given to allow patient to have graham crackers and patient tolerated it well without any nausea or vomiting. Will notify the doctor to see if they would like to advance diet.

## 2016-01-16 NOTE — Progress Notes (Signed)
Cardiac monitor discontinued. Call received from Dr. Ree Kida who spoke with Cardiology in regards to outpatient TEE. Cardiology to call patient with TEE information and when procedure will be scheduled. Home discharge instructions discussed with patient. Discussed diet, activity, follow up appts and medications. Prescription for Xarelto given to patient. Xarelto 30 day card previously given to patient by case manager. Prescription for flagyl and Cipro previously sent in to pharmacy preference by MD. Patient able to use teachback in regards to discharge instructions.

## 2016-01-16 NOTE — Progress Notes (Signed)
Utilization review completed. Steven Basso, RN, BSN. 

## 2016-01-16 NOTE — Discharge Instructions (Signed)
Splenic Injury A splenic injury is an injury of the spleen. The spleen is an organ located in the upper left area of your abdomen, just under your ribs. Your spleen filters and cleans your blood. It also stores blood cells and destroys cells that are worn out. Your spleen is also important for fighting disease.  Splenic injuries can vary. In some cases, the spleen may only be bruised with some bleeding inside the covering and around the spleen. Splenic injuries may also cause a deep tear or cut into the spleen (lacerated spleen). Some splenic injuries can cause the spleen to break open (rupture). CAUSES  Splenic injuries can be caused by a direct blow (blunt trauma) from:  Car accidents.  Contact sports.  Falls. Gunshot wounds or knife wounds (penetrating injuries) can also cause a splenic injury. RISK FACTORS You may be at greater risk for a splenic injury if you have a disease that can cause the spleen to become enlarged. These include:  Alcoholic liver disease.  Viral infections, especially mononucleosis. SIGNS AND SYMPTOMS  A minor splenic injury often causes no symptoms or only minor abdominal pain. If the injury causes severe bleeding, your blood pressure may rapidly decrease. This may cause:  Dizziness or light-headedness.  Rapid heart rate.  Difficulty breathing.  Fainting.  Sweating with clammy skin. Other signs and symptoms of a splenic injury can include:  Very bad abdominal pain.  Pain in the left shoulder.  Pain when the abdomen is pressed (tenderness).  Nausea.  Swelling or bruising of the abdomen. DIAGNOSIS  Your health care provider may suspect a splenic injury based on your signs and symptoms, especially if you were recently in an accident or you recently got hurt. Your health care provider will do a physical exam. Imaging tests may be done to confirm the diagnosis. These may include:  Ultrasound.  CT scan. You may have frequent blood tests for a few  days after the injury to monitor your condition.  TREATMENT  Treatment depends on the type of splenic injury you have and how bad it is. Your health care provider will develop a treatment plan specific to your needs.  Less severe injuries may be treated with:  Observation.  Interventional radiology. This involves using flexible tubes (catheters) to stop the bleeding from inside the blood vessel.   More severe injuries may require hospitalization in the intensive care unit (ICU). While you are in the ICU:   Your fluid and blood levels will be monitored closely.  You will get fluids through an IV tube as needed.   You may need follow-up scans to check whether your spleen is able to heal itself. If the injury is getting worse, you may need surgery.  You may receive donated blood (transfusion).  You may have a long needle inserted into your abdomen to remove any blood that has collected inside the spleen (hematoma).  Surgery. If your blood pressure is too low, you may need emergency surgery. This may include:  Repairing a laceration.  Removing part of the spleen.  Removing the entire spleen (splenectomy). HOME CARE INSTRUCTIONS  Take medicines only as directed by your health care provider.  Rest at home.  Do not participate in any strenuous activity until your health care provider says it is safe to do so.  Do not lift anything that is heavier than 10 lb (4.5 kg).  Do not participate in contact sports until your health care provider says it is safe to do so. SEEK  MEDICAL CARE IF:  You have a fever.  You have new or increasing pain in your abdomen or in your left shoulder. SEEK IMMEDIATE MEDICAL CARE IF:  You have signs or symptoms of internal bleeding. Watch for:  Sweating.  Dizziness.  Weakness.  Cold and clammy skin.  Fainting.  You have chest pain or difficulty breathing.   This information is not intended to replace advice given to you by your health  care provider. Make sure you discuss any questions you have with your health care provider.   Document Released: 08/31/2006 Document Revised: 11/30/2014 Document Reviewed: 07/25/2014 Elsevier Interactive Patient Education 2016 [place] on my medicine - XARELTO (rivaroxaban)  This medication education was reviewed with me or my healthcare representative as part of my discharge preparation.  The pharmacist that spoke with me during my hospital stay was:  Tad Moore, [place]? 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  What do you need to know about Xarelto? The starting dose is one 15 mg tablet taken TWICE daily with food for the FIRST 21 DAYS then the dose is changed to one 20 mg tablet taken ONCE A DAY with your evening meal.  DO NOT stop taking Xarelto without talking to the health care provider who prescribed the medication.  Refill your prescription for 20 mg tablets before you run out.  After discharge, you should have regular check-up appointments with your healthcare provider that is prescribing your Xarelto.  In the future your dose may need to be changed if your kidney function changes by a significant amount.  What do you do if you miss a dose? If you are taking Xarelto TWICE DAILY and you miss a dose, take it as soon as you remember. You may take two 15 mg tablets (total 30 mg) at the same time then resume your regularly scheduled 15 mg twice daily the next day.  If you are taking Xarelto ONCE DAILY and you miss a dose, take it as soon as you remember on the same day then continue your regularly scheduled once daily regimen the next day. Do not take two doses of Xarelto at the same time.   Important Safety Information Xarelto is a blood thinner medicine that  can cause bleeding. You should call your healthcare provider right away if you experience any of the following: ? Bleeding from an injury or your nose that does not stop. ? Unusual colored urine (red or dark brown) or unusual colored stools (red or black). ? Unusual bruising for unknown reasons. ? A serious fall or if you hit your head (even if there is no bleeding).  Some medicines may interact with Xarelto and might increase your risk of bleeding while on Xarelto. To help avoid this, consult your healthcare provider or pharmacist prior to using any new prescription or non-prescription medications, including herbals, vitamins, non-steroidal anti-inflammatory drugs (NSAIDs) and supplements.  This website has more information on Xarelto: [email or web address]

## 2016-01-16 NOTE — Progress Notes (Signed)
Patient is going home on Xarelto; coupon card given to patient with explanation of usage. Card is activated, patient is to take coupon card along with prescription to her pharmacy/ [facility] for a free 30 day supply of medication. CM instructed patient to contact Xarelto medication assistance program for ongoing assistance, patient acknowledged understanding. Mindi Slicker [facility] [phone]

## 2016-01-16 NOTE — Telephone Encounter (Signed)
Pt aware of np appt on 01/23/16@12 :30

## 2016-01-16 NOTE — Discharge Summary (Signed)
Physician Discharge Summary  Margaret [name] [id number] DOB: [birth year] DOA: 01/14/2016  PCP: Margaret Czech, MD  Admit date: 01/14/2016 Discharge date: 01/16/2016  Time spent: 45 minutes  Recommendations for Outpatient Follow-up:  Patient will be discharged to home.  Patient will need to follow up with primary care provider within one week of discharge.  Follow up with Dr. Heath [name], 01/23/16 at 12:30pm.  Cardiology will contact you with an appointment for outpatient TEE. Patient should continue medications as prescribed.  Patient should follow a heart healthy diet.   Discharge Diagnoses:  Nausea, vomiting, abdominal pain likely secondary to acute splenic infarction Hepatic cirrhosis Diabetes mellitus, type II Essential hypertension COPD Chronic diastolic heart failure  Discharge Condition: Stable  Diet recommendation: heart healthy  Filed Weights   01/14/16 1415 01/15/16 0644 01/16/16 0414  Weight: 129.7 kg (285 lb 15 oz) 130.636 kg (288 lb) 132.042 kg (291 lb 1.6 oz)    History of present illness:  on 01/14/2016 by Margaret [name] is a 60 y.o. female  Left upper quadrant abdominal pain. Started approximately 12 hours ago. Sudden onset. Constant with waxing and waning nature. Associated with nausea and vomiting. Worse and patient tries to eat. Improves after emesis. Patient never had these symptoms before. No change with body movement or bowel movement. Denies any dysuria frequency, fever, neck stiffness, rash. No recent antibiotics. Patient does not drink alcohol at all.  Hospital Course:  Nausea, vomiting, abdominal pain likely secondary to acute splenic infarction -Noted on CT, encompasses approximately 50% of the spleen, upper half. No direct evidence of clot though some atherosclerotic plaques noted on the CT scan. -Patient denies a history of clotting -Lower extremity Doppler negative for DVT -Echocardiogram: EF of 60-65%, grade 2  diastolic dysfunction, no cardiac source of emboli was identified, no ASD or PFO -Vascular surgery consulted and appreciate, recommended cardiac workup -Was initially placed on heparin drip, transitioned to Xarelto -Continue Cipro/Flagyl -Patient will need TEE as an outpatient to rule out any cardiac source for emboli -Hypercoagulable workup pending -Patient was given HIB, meningicoccal, pneumococcal vaccines   -Spoke with Dr. Alvy Bimler, will see the patient as an outpatient  Hepatic cirrhosis -CT abdomen showing advanced hepatic steatosis and early cirrhosis -Patient denied alcohol use -Possibly secondary to obesity, hyperlipidemia, hyperglycemia -Continue his prolactin, Lasix, atenolol -Hepatitis panel unremarkable  Diabetes mellitus, type II -Hemoglobin A1c 9.1 -Continue home regimen at discharge -Patient will need follow-up with her primary care physician for better diabetes control and management.  Essential hypertension -Continue atenolol  COPD -Currently at baseline, former smoker -Continue albuterol as needed  Chronic diastolic heart failure -Currently compensated -Echo as above -Continue diuretics -Monitor daily weights, intake and output  Procedures  Echocardiogram with bubble study LE doppler  Consults  Vascular surgery Cardiology  Discharge Exam: Filed Vitals:   01/16/16 0742 01/16/16 0927  BP: 122/68 121/48  Pulse: 66 64  Temp: 99.3 F (37.4 C)   Resp: 22    Exam  General: Well developed, well nourished, NAD  HEENT: NCAT,mucous membranes moist.   Cardiovascular: S1 S2 auscultated, 2/6 SEM, RRR  Respiratory: Clear to auscultation bilaterally   Abdomen: Soft, obese, nontender, nondistended, + bowel sounds  Extremities: warm dry without cyanosis clubbing or edema  Neuro: AAOx3, nonfocal  Psych: Normal affect and demeanor   Discharge Instructions      Discharge Instructions    Discharge instructions    Complete by:  As directed    Patient will be discharged  to home.  Patient will need to follow up with primary care provider within one week of discharge.  Follow up with Dr. Heath [name], office will contact you for an appointment.  Patient should continue medications as prescribed.  Patient should follow a heart healthy diet.            Medication List    TAKE these medications        albuterol 108 (90 Base) MCG/ACT inhaler  Commonly known as:  PROVENTIL HFA;VENTOLIN HFA  Inhale 2 puffs into the lungs every 4 (four) hours as needed. For shortness of breath.     albuterol (2.5 MG/3ML) 0.083% nebulizer solution  Commonly known as:  PROVENTIL  Take 2.5 mg by nebulization at bedtime.     Alogliptin-Metformin HCl 12.03-999 MG Tabs  Take 1 tablet by mouth 2 (two) times daily.     aspirin 81 MG chewable tablet  Chew 81 mg by mouth daily.     atenolol 50 MG tablet  Commonly known as:  TENORMIN  Take 25 mg by mouth 2 (two) times daily.     cholecalciferol 1000 units tablet  Commonly known as:  VITAMIN D  Take 1,000 Units by mouth 2 (two) times daily.     ciprofloxacin 500 MG tablet  Commonly known as:  CIPRO  Take 1 tablet (500 mg total) by mouth 2 (two) times daily.     furosemide 40 MG tablet  Commonly known as:  LASIX  Take 40 mg by mouth daily.     ibuprofen 200 MG tablet  Commonly known as:  ADVIL,MOTRIN  Take 600 mg by mouth every 6 (six) hours as needed for headache or mild pain.     insulin NPH-regular Human (70-30) 100 UNIT/ML injection  Commonly known as:  NOVOLIN 70/30  Inject 6-16 Units into the skin 2 (two) times daily with a meal.     loperamide 2 MG capsule  Commonly known as:  IMODIUM  Take 2 mg by mouth daily as needed for diarrhea or loose stools.     lovastatin 20 MG tablet  Commonly known as:  MEVACOR  Take 10 mg by mouth at bedtime.     metroNIDAZOLE 500 MG tablet  Commonly known as:  FLAGYL  Take 1 tablet (500 mg total) by mouth every 8 (eight) hours.     naproxen sodium  220 MG tablet  Commonly known as:  ANAPROX  Take 220 mg by mouth 2 (two) times daily as needed (pain).     nitroGLYCERIN 0.4 MG SL tablet  Commonly known as:  NITROSTAT  Place 1 tablet (0.4 mg total) under the tongue every 5 (five) minutes x 3 doses as needed for chest pain.     Potassium 99 MG Tabs  Take 99 mg by mouth every morning.     Rivaroxaban 15 & 20 MG Tbpk  Commonly known as:  XARELTO STARTER PACK  Take as directed on package: Start with one 15mg  tablet by mouth twice a day with food. On Day 22, switch to one 20mg  tablet once a day with food.     spironolactone 25 MG tablet  Commonly known as:  ALDACTONE  Take 50 mg by mouth daily.       Allergies  Allergen Reactions  . Codeine Hives, Itching and Other (See Comments)    "breathing problems"  . Latex Itching   Follow-up Information    Follow up with HOOPER,JEFFREY C, MD. Schedule an appointment as soon as possible  for a visit in 1 week.   Specialty:  Geriatric Medicine   Why:  Hospital follow up   Contact information:   [address] [place] [address] [phone]        The results of significant diagnostics from this hospitalization (including imaging, microbiology, ancillary and laboratory) are listed below for reference.    Significant Diagnostic Studies: Ct Abdomen Pelvis W Contrast  01/14/2016  CLINICAL DATA:  60 year old female with left-sided abdominal pain, nausea, vomiting and constipation EXAM: CT ABDOMEN AND PELVIS WITH CONTRAST TECHNIQUE: Multidetector CT imaging of the abdomen and pelvis was performed using the standard protocol following bolus administration of intravenous contrast. CONTRAST:  113mL OMNIPAQUE IOHEXOL 300 MG/ML  SOLN COMPARISON:  Prior CT scan of the chest including the lung bases 06/18/2012 FINDINGS: Lower Chest: Respiratory motion limits evaluation for small pulmonary nodules. Mild mosaic attenuation in the lower lobes may represent areas of subsegmental atelectasis or air  trapping. Cardiomegaly. Suggestion of contrast material within the inter atrial septum. Evaluation is limited by non gated technique but there could be a patent foramen ovale. Trace calcification of the mitral valve annulus. No evidence of pericardial effusion. Unremarkable visualized esophagus. Abdomen: Unremarkable CT appearance of the stomach, duodenum, adrenal glands and pancreas. There is significantly decreased attenuation in the upper pole of the spleen with a sharp geographic cut off in the mid aspect of the spleen. Overall, the upper 50% of the splenic parenchyma is involved. These imaging findings are incompletely imaged but appear to persist on delayed imaging. This is concerning for splenic infarct. Mildly abnormal hepatic contour with blunting of the tip of the lateral segment. Additionally, the liver is diffusely low in attenuation consistent with hepatic steatosis. No discrete lesion is identified. The portal veins remain patent. Mildly prominent upper abdominal (gastrohepatic ligament, hepatic duodenum ligament) lymph nodes measuring up to 11 mm (image 26 series 2). Portacaval lymph node measures up to 17 mm in short axis on image 36 of series 2. Unremarkable appearance of the bilateral kidneys. No focal solid lesion, hydronephrosis or nephrolithiasis. Whole 0.9 cm simple cyst in the interpolar left kidney. 1.8 cm simple cyst in the lower pole of the left kidney. There are a few scattered colonic diverticula but no evidence of active diverticulitis. The terminal ileum is unremarkable. The appendix is not visualized and is presumed surgically absent. There is no inflammatory change in the right lower quadrant. No focal bowel wall thickening or evidence of obstruction. No free fluid. Pelvis: 1.8 cm low-attenuation cystic lesion in the left ovary. The uterus is unremarkable. The right ovary is unremarkable. Normal appearance of the bladder. No free fluid or suspicious adenopathy. Musculoskeletal:  Omental fat containing periumbilical hernia. No acute fracture or aggressive appearing lytic or blastic osseous lesion. Chronic L5 pars defects bilaterally. Grade 1 anterolisthesis of L5 on S1 measuring approximately 4 mm. Vascular: Atherosclerotic vascular disease without significant stenosis or aneurysmal dilatation. IMPRESSION: 1. 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 While there is underlying atherosclerotic vascular disease, there is no irregular atherosclerotic plaque or wall adherent mural thrombus to suggesting an embolic source. Does the patient have a history of atrial fibrillation? Additionally, there is a suggestion of a possible ASD or patent foramen ovale although this could easily be artifactual given the non cardiac gated nature of the study. Regardless, further evaluation with an echocardiographic bubble study to assess for right to left shunting is recommended. 2. Cardiomegaly. 3. Advanced hepatic steatosis with early morphologic cirrhotic change.  Recommend correlation with serum LFTs. If there is evidence of transaminitis, recommend non emergent outpatient referral to hepatology/gastroenterology. 4. Prominent upper abdominal lymph nodes are favored to be reactive and related to the underlying liver disease. 5. Incidentally noted 18 mm simple appearing cystic lesion in the left ovary. This is almost certainly benign, but follow up ultrasound is recommended in 1 year according to the Society of Radiologists in [place] Statement (D Clovis Riley et al. Management of Asymptomatic Ovarian and Other Adnexal Cysts Imaged at [place]: Society of Radiologists in [place] Statement 2010. Radiology 256 (Sept 2010): 943-954.). 6. Omental fat containing periumbilical hernia. 7. Chronic bilateral L5 pars defects with grade 1 anterolisthesis of L5 on S1. These results were called by telephone at the time of interpretation on 01/14/2016 at 8:42 am to Dr. Billy Fischer,  who verbally acknowledged these results. Electronically Signed   By: Jacqulynn Cadet M.D.   On: 01/14/2016 08:43   Ct Angio Chest Aorta W/cm &/or Wo/cm  01/14/2016  CLINICAL DATA:  60 year old female with lower extremity swelling and shortness of breath. Found have acute splenic infarct on CT Abdomen and Pelvis 0811 hours today. Initial encounter. EXAM: CT ANGIOGRAPHY CHEST, ABDOMEN AND PELVIS TECHNIQUE: Multidetector CT imaging through the chest, abdomen and pelvis was performed using the standard protocol during bolus administration of intravenous contrast. Multiplanar reconstructed images and MIPs were obtained and reviewed to evaluate the vascular anatomy. CONTRAST:  100 mL Omnipaque 350 COMPARISON:  Contrast-enhanced CT Abdomen and Pelvis 811 hours today. Chest CTA 06/18/2012. FINDINGS: CTA CHEST FINDINGS Large body habitus. Lower lung volumes compared to 2013 with widespread pulmonary septal thickening and ground-glass opacity. No pleural effusion. No consolidation. Central airways are patent aside from atelectatic changes. Negative thoracic inlet. No axillary lymphadenopathy. Chronic mild to moderate mediastinal and hilar lymphadenopathy has not significantly changed since 2013. No pleural or pericardial effusion. Osteopenia and bulky degenerative osteophytosis in the thoracic spine. New since 2013 is erosion with irregular margins of the T8-T9 in plates (sagittal image 96) there appears to be chronic interbody fusion at this level via bulky right antral lateral endplate osteophytes. There is no retropulsion of bone or abnormal paraspinal soft tissue here. No other acute or suspicious osseous lesion in the chest. VASCULAR FINDINGS: Central pulmonary artery enlargement (up to 42 mm diameter main pulmonary artery versus adjacent ascending aorta measuring 33 mm diameter) is stable since 2013. No central or hilar pulmonary artery filling defect. Thoracic aorta is remarkable for scattered soft and calcified  atherosclerotic plaque, most apparent in the arch (series 8, image 109) which is mildly progressed since 2013. Great vessel origins are patent. Calcified coronary artery atherosclerosis is evident. No thoracic aortic dissection or aneurysm. Review of the MIP images confirms the above findings. CTA ABDOMEN AND PELVIS FINDINGS Chronic L5 pars fractures with grade 1 spondylolisthesis. Osteopenia. No acute osseous abnormality identified. Large volume of excreted IV contrast in the urinary bladder which results in streak artifact both through the pelvis. No pelvic free fluid is evident. Visualized pelvic and abdominal viscera appear stable since this morning. Hypo enhancement of the superior half of the spleen re- demonstrated. No abdominal free fluid. Mild upper abdominal lymphadenopathy again noted. VASCULAR FINDINGS: Intermittent soft and calcified atherosclerosis of the abdominal aorta. Major abdominal aortic branches remain patent including the celiac artery and main splenic artery. Major SMA branches appear patent. There does appear to be atherosclerotic stenosis at the IMA origin which remains patent. Calcified plaque continues into the bilateral iliac  arteries. The visualized bilateral femoral artery is are patent with mild calcified plaque. No arterial dissection or aneurysm identified. Review of the MIP images confirms the above findings. IMPRESSION: 1. Intermittent soft and calcified aortic atherosclerosis in the chest and abdomen. Major aortic branches including the celiac and main splenic artery remain patent. There does appear to be atherosclerotic stenosis at the IMA origin. 2. Chronic pulmonary artery hypertension, with stable main pulmonary artery enlargement since 2013. 3. Pulmonary ground-glass opacity and septal thickening is nonspecific with top differential considerations of pulmonary edema and atypical infection. No pleural effusion. 4. Stable visualized abdominal viscera since CT Abdomen and  Pelvis this morning, splenic infarct re- demonstrated. 5. New T8-T9 vertebral endplate erosions since 2013. This is nonspecific but benign or degenerative etiology is favored over infectious or malignant process. Thoracic spine MRI (without and with contrast preferred) may characterize further. 6. Chronic nonspecific mediastinal lymphadenopathy, stable since 2013. Electronically Signed   By: Genevie Ann M.D.   On: 01/14/2016 13:47   Ct Angio Abd/pel W/ And/or W/o  01/14/2016  CLINICAL DATA:  60 year old female with lower extremity swelling and shortness of breath. Found have acute splenic infarct on CT Abdomen and Pelvis 0811 hours today. Initial encounter. EXAM: CT ANGIOGRAPHY CHEST, ABDOMEN AND PELVIS TECHNIQUE: Multidetector CT imaging through the chest, abdomen and pelvis was performed using the standard protocol during bolus administration of intravenous contrast. Multiplanar reconstructed images and MIPs were obtained and reviewed to evaluate the vascular anatomy. CONTRAST:  100 mL Omnipaque 350 COMPARISON:  Contrast-enhanced CT Abdomen and Pelvis 811 hours today. Chest CTA 06/18/2012. FINDINGS: CTA CHEST FINDINGS Large body habitus. Lower lung volumes compared to 2013 with widespread pulmonary septal thickening and ground-glass opacity. No pleural effusion. No consolidation. Central airways are patent aside from atelectatic changes. Negative thoracic inlet. No axillary lymphadenopathy. Chronic mild to moderate mediastinal and hilar lymphadenopathy has not significantly changed since 2013. No pleural or pericardial effusion. Osteopenia and bulky degenerative osteophytosis in the thoracic spine. New since 2013 is erosion with irregular margins of the T8-T9 in plates (sagittal image 96) there appears to be chronic interbody fusion at this level via bulky right antral lateral endplate osteophytes. There is no retropulsion of bone or abnormal paraspinal soft tissue here. No other acute or suspicious osseous lesion  in the chest. VASCULAR FINDINGS: Central pulmonary artery enlargement (up to 42 mm diameter main pulmonary artery versus adjacent ascending aorta measuring 33 mm diameter) is stable since 2013. No central or hilar pulmonary artery filling defect. Thoracic aorta is remarkable for scattered soft and calcified atherosclerotic plaque, most apparent in the arch (series 8, image 109) which is mildly progressed since 2013. Great vessel origins are patent. Calcified coronary artery atherosclerosis is evident. No thoracic aortic dissection or aneurysm. Review of the MIP images confirms the above findings. CTA ABDOMEN AND PELVIS FINDINGS Chronic L5 pars fractures with grade 1 spondylolisthesis. Osteopenia. No acute osseous abnormality identified. Large volume of excreted IV contrast in the urinary bladder which results in streak artifact both through the pelvis. No pelvic free fluid is evident. Visualized pelvic and abdominal viscera appear stable since this morning. Hypo enhancement of the superior half of the spleen re- demonstrated. No abdominal free fluid. Mild upper abdominal lymphadenopathy again noted. VASCULAR FINDINGS: Intermittent soft and calcified atherosclerosis of the abdominal aorta. Major abdominal aortic branches remain patent including the celiac artery and main splenic artery. Major SMA branches appear patent. There does appear to be atherosclerotic stenosis at the IMA origin which  remains patent. Calcified plaque continues into the bilateral iliac arteries. The visualized bilateral femoral artery is are patent with mild calcified plaque. No arterial dissection or aneurysm identified. Review of the MIP images confirms the above findings. IMPRESSION: 1. Intermittent soft and calcified aortic atherosclerosis in the chest and abdomen. Major aortic branches including the celiac and main splenic artery remain patent. There does appear to be atherosclerotic stenosis at the IMA origin. 2. Chronic pulmonary artery  hypertension, with stable main pulmonary artery enlargement since 2013. 3. Pulmonary ground-glass opacity and septal thickening is nonspecific with top differential considerations of pulmonary edema and atypical infection. No pleural effusion. 4. Stable visualized abdominal viscera since CT Abdomen and Pelvis this morning, splenic infarct re- demonstrated. 5. New T8-T9 vertebral endplate erosions since 2013. This is nonspecific but benign or degenerative etiology is favored over infectious or malignant process. Thoracic spine MRI (without and with contrast preferred) may characterize further. 6. Chronic nonspecific mediastinal lymphadenopathy, stable since 2013. Electronically Signed   By: Genevie Ann M.D.   On: 01/14/2016 13:47    Microbiology: No results found for this or any previous visit (from the past 240 hour(s)).   Labs: Basic Metabolic Panel:  Recent Labs Lab 01/14/16 0505 01/15/16 0543 01/16/16 0608  NA 132* 134* 133*  K 4.5 5.0 4.8  CL 97* 101 99*  CO2 22 27 28   GLUCOSE 269* 209* 178*  BUN 18 10 8   CREATININE 0.94 0.88 0.73  CALCIUM 10.6* 9.3 9.1   Liver Function Tests:  Recent Labs Lab 01/14/16 0505 01/15/16 0543  AST 17 16  ALT 16 15  ALKPHOS 78 78  BILITOT 0.5 0.6  PROT 7.3 6.5  ALBUMIN 3.3* 2.7*    Recent Labs Lab 01/14/16 0505  LIPASE 25   No results for input(s): AMMONIA in the last 168 hours. CBC:  Recent Labs Lab 01/14/16 0505 01/15/16 0543 01/16/16 0608  WBC 17.5* 16.8* 16.6*  NEUTROABS 15.5*  --   --   HGB 13.1 12.1 11.7*  HCT 40.5 39.1 36.8  MCV 87.1 92.0 91.8  PLT 288 264 271   Cardiac Enzymes: No results for input(s): CKTOTAL, CKMB, CKMBINDEX, TROPONINI in the last 168 hours. BNP: BNP (last 3 results) No results for input(s): BNP in the last 8760 hours.  ProBNP (last 3 results) No results for input(s): PROBNP in the last 8760 hours.  CBG:  Recent Labs Lab 01/15/16 0642 01/15/16 1136 01/15/16 1705 01/15/16 2044 01/16/16 0557   GLUCAP 239* 184* 208* 128* 178*       Signed:  Cristal Ford  [facility] 01/16/2016, 11:05 AM

## 2016-01-20 LAB — FACTOR 5 LEIDEN

## 2016-01-23 ENCOUNTER — Ambulatory Visit (HOSPITAL_BASED_OUTPATIENT_CLINIC_OR_DEPARTMENT_OTHER): Payer: Self-pay | Admitting: Hematology and Oncology

## 2016-01-23 ENCOUNTER — Encounter: Payer: Self-pay | Admitting: Hematology and Oncology

## 2016-01-23 ENCOUNTER — Telehealth: Payer: Self-pay | Admitting: Hematology and Oncology

## 2016-01-23 VITALS — BP 121/62 | HR 56 | Temp 98.0°F | Resp 20 | Wt 280.9 lb

## 2016-01-23 DIAGNOSIS — E119 Type 2 diabetes mellitus without complications: Secondary | ICD-10-CM

## 2016-01-23 DIAGNOSIS — I5032 Chronic diastolic (congestive) heart failure: Secondary | ICD-10-CM

## 2016-01-23 DIAGNOSIS — R918 Other nonspecific abnormal finding of lung field: Secondary | ICD-10-CM

## 2016-01-23 DIAGNOSIS — R609 Edema, unspecified: Secondary | ICD-10-CM

## 2016-01-23 DIAGNOSIS — K7469 Other cirrhosis of liver: Secondary | ICD-10-CM

## 2016-01-23 DIAGNOSIS — R0609 Other forms of dyspnea: Secondary | ICD-10-CM

## 2016-01-23 DIAGNOSIS — D735 Infarction of spleen: Secondary | ICD-10-CM

## 2016-01-23 DIAGNOSIS — I2729 Other secondary pulmonary hypertension: Secondary | ICD-10-CM | POA: Insufficient documentation

## 2016-01-23 DIAGNOSIS — Z794 Long term (current) use of insulin: Secondary | ICD-10-CM

## 2016-01-23 DIAGNOSIS — I272 Other secondary pulmonary hypertension: Secondary | ICD-10-CM

## 2016-01-23 DIAGNOSIS — IMO0001 Reserved for inherently not codable concepts without codable children: Secondary | ICD-10-CM

## 2016-01-23 DIAGNOSIS — K7460 Unspecified cirrhosis of liver: Secondary | ICD-10-CM

## 2016-01-23 DIAGNOSIS — Z87891 Personal history of nicotine dependence: Secondary | ICD-10-CM

## 2016-01-23 HISTORY — DX: Other secondary pulmonary hypertension: I27.29

## 2016-01-23 NOTE — Assessment & Plan Note (Signed)
After reviewing her case extensively, I felt that the most likely cause of splenic infarct could be related to splenic congestion from liver cirrhosis with possibility of component of peripheral vascular disease due to poorly controlled diabetes and mild congestive heart failure. I'm also concerned that she may have chronic infarcts causing pulmonary hypertension. Bilateral lower venous Doppler ultrasound performed recently showed no evidence of DVT. She has hypercoagulable panel performed in the hospital with negative results. I did not see lupus anticoagulant testing performed but I would not pursue this as this would not change medical decision Overall, I think she needs to remain on chronic anticoagulation therapy for minimum of 6 months. She is taking aspirin to prevent risk of heart disease and stroke and that could predispose her to increased risk of bleeding. She had mild abnormal-looking stool which I suspect could be due to antibiotic therapy. I told her to discontinue antibiotic therapy and naproxen. However, if she still felt she may have passage of dark stool, then I recommend she discontinue aspirin but to remain on Xarelto. She needs to inform me if she see evidence of bleeding.

## 2016-01-23 NOTE — Assessment & Plan Note (Signed)
Recent CT scan shows significant fatty liver disease with possible early changes of cirrhosis. The patient is morbidly obese. I recommend dietary modification and weight loss

## 2016-01-23 NOTE — Assessment & Plan Note (Signed)
Her BMI is over 40. She has evidence of fatty liver disease with early cirrhosis and poorly controlled diabetes. I recommend dietary modification and close follow-up for diabetes management with her primary care doctor

## 2016-01-23 NOTE — Assessment & Plan Note (Signed)
She has poorly controlled diabetes with hemoglobin A1c greater than 9%. Her BMI is high. I recommend close follow-up with primary care doctor for medical management

## 2016-01-23 NOTE — Assessment & Plan Note (Signed)
She has evidence of pulmonary hypertension seen on recent CT angiogram, shortness of breath and evidence of bilateral lower extremity edema. Echocardiogram show evidence of diastolic dysfunction. Apparently, she has been referred to see a cardiologist. I would defer to them for further management.

## 2016-01-23 NOTE — Assessment & Plan Note (Signed)
She has evidence of pulmonary infiltrates which look a little worse compared to her prior CT scan from 2013. She has evidence of shortness of breath on minimal exertion and pulmonary hypertension on recent CT angiogram. I recommend she resume follow-up with her pulmonologist for further evaluation and management. The patient denies recent smoking

## 2016-01-23 NOTE — Assessment & Plan Note (Signed)
CT scan angiogram show evidence of pulmonary hypertension. Chronic thromboembolic disease cannot be ruled out. I recommend she continue on anticoagulation therapy for now I recommend she follows closely with cardiologist

## 2016-01-23 NOTE — Telephone Encounter (Signed)
per pof to sch pt appt-gave pt copy of avs °

## 2016-01-23 NOTE — Progress Notes (Signed)
[place] NOTE  Patient Care Team: Harvie Junior, MD as PCP - General (Family Medicine) Deneise Lever, MD as Consulting Physician (Pulmonary Disease)  CHIEF COMPLAINTS/PURPOSE OF CONSULTATION:  Recent splenic infarct  HISTORY OF PRESENTING ILLNESS:  Margaret [name] 60 y.o. female is here because of recent diagnosis of splenic infarct. She was recently hospitalized for this and is referred here for further evaluation and management. Prior to the incident, she denies lower extremity warmth or tenderness. She have chronic bilateral lower extremity edema with erythema.  She denies recent chest pain on exertion, shortness of breath on minimal exertion, pre-syncopal episodes, hemoptysis, or palpitation. She denies recent history of trauma, long distance travel, dehydration, recent surgery, smoking or prolonged immobilization. She had no prior history or diagnosis of cancer. Her age appropriate screening programs are up-to-date. She had prior surgeries before and never had perioperative thromboembolic events. The patient had been exposed to birth control pills and never had thrombotic events. The patient had been pregnant before and denies history of peripartum thromboembolic event or history of recurrent miscarriages. Her maternal grandmother died of blood clot  While hospitalized, she had hypercoagulable panel performed which came back negative for protein C and protein S deficiency, factor V Leiden mutation, anti-thrombin 3 deficiency or prothrombin gene mutation Echocardiogram on 02/21/2017showed preserved ejection fraction but with evidence of diastolic dysfunction CT scan of the chest, abdomen and pelvis and angiogram studies show evidence of splenic infarct. In addition, there were evidence of lower lung volumes with widespread pulmonary septal thickening and groundglass opacity. There were also evidence of central pulmonary artery enlargement and evidence of  calcified atherosclerosis in the abdominal aorta and presence of stenosis at the IMA origin. CT abdomen also showed evidence of diffuse hepatic steatosis with early morphological cirrhotic change  She complained of altered taste sensation with metallic taste in her mouth and passage of loose stool that appears somewhat dark. She has occasional blood when she blows her nose but never has spontaneous epistaxis. The patient denies any recent signs or symptoms of bleeding such as spontaneous epistaxis, hematuria or hematochezia. She has chronic bilateral lower extremity edema and shortness of breath on minimal exertion.  MEDICAL HISTORY:  Past Medical History  Diagnosis Date  . Hypertension   . Hyperlipidemia   . COPD (chronic obstructive pulmonary disease) ([place]) 04/20/12  . Asthma   . Type II diabetes mellitus ([place])   . Anemia   . H/O hiatal hernia   . Osteoarthritis   . Pneumonia 04/2012  . Migraines   . Panic attacks 04/20/12  . Morbid obesity ([place])   . Heart murmur     SURGICAL HISTORY: Past Surgical History  Procedure Laterality Date  . Laceration repair  1966    "right upper arm"  . Dilation and curettage of uterus  1990's  . Finger reattachment  1973    "right ring finger"    SOCIAL HISTORY: Social History   Social History  . Marital Status: Married    Spouse Name: N/A  . Number of Children: N/A  . Years of Education: N/A   Occupational History  . Not on file.   Social History Main Topics  . Smoking status: Former Smoker -- 0.20 packs/day for 40 years    Types: Cigarettes    Quit date: 04/20/2012  . Smokeless tobacco: Never Used     Comment: 6/5//13 "a pack of cigarettes would last me 1 - 1 1/2 wk"  . Alcohol Use:  No  . Drug Use: No  . Sexual Activity: Not Currently     Comment: married, 2 children, work with computers   Other Topics Concern  . Not on file   Social History Narrative    FAMILY HISTORY: Family History  Problem Relation Age of Onset  .  Allergies    . Hypertension    . Coronary artery disease Mother 81  . Stroke Father   . COPD Father   . Clotting disorder Maternal Grandmother     died from blood clot    ALLERGIES:  is allergic to codeine and latex.  MEDICATIONS:  Current Outpatient Prescriptions  Medication Sig Dispense Refill  . albuterol (PROVENTIL HFA;VENTOLIN HFA) 108 (90 BASE) MCG/ACT inhaler Inhale 2 puffs into the lungs every 4 (four) hours as needed. For shortness of breath.    Marland Kitchen albuterol (PROVENTIL) (2.5 MG/3ML) 0.083% nebulizer solution Take 2.5 mg by nebulization at bedtime.     . Alogliptin-Metformin HCl 12.03-999 MG TABS Take 1 tablet by mouth 2 (two) times daily.    Marland Kitchen aspirin 81 MG chewable tablet Chew 81 mg by mouth daily.    Marland Kitchen atenolol (TENORMIN) 50 MG tablet Take 25 mg by mouth 2 (two) times daily.     . cholecalciferol (VITAMIN D) 1000 units tablet Take 1,000 Units by mouth 2 (two) times daily.    . ciprofloxacin (CIPRO) 500 MG tablet Take 1 tablet (500 mg total) by mouth 2 (two) times daily. 10 tablet 0  . furosemide (LASIX) 40 MG tablet Take 40 mg by mouth daily.     Marland Kitchen ibuprofen (ADVIL,MOTRIN) 200 MG tablet Take 600 mg by mouth every 6 (six) hours as needed for headache or mild pain.    Marland Kitchen insulin NPH-regular (NOVOLIN 70/30) (70-30) 100 UNIT/ML injection Inject 6-16 Units into the skin 2 (two) times daily with a meal.    . loperamide (IMODIUM) 2 MG capsule Take 2 mg by mouth daily as needed for diarrhea or loose stools.    . lovastatin (MEVACOR) 20 MG tablet Take 10 mg by mouth at bedtime.    . metroNIDAZOLE (FLAGYL) 500 MG tablet Take 1 tablet (500 mg total) by mouth every 8 (eight) hours. 15 tablet 0  . naproxen sodium (ANAPROX) 220 MG tablet Take 220 mg by mouth 2 (two) times daily as needed (pain).    . Potassium 99 MG TABS Take 99 mg by mouth every morning.    . Rivaroxaban (XARELTO STARTER PACK) 15 & 20 MG TBPK Take as directed on package: Start with one 15mg  tablet by mouth twice a day with  food. On Day 22, switch to one 20mg  tablet once a day with food. 51 each 0  . spironolactone (ALDACTONE) 25 MG tablet Take 50 mg by mouth daily.     . nitroGLYCERIN (NITROSTAT) 0.4 MG SL tablet Place 1 tablet (0.4 mg total) under the tongue every 5 (five) minutes x 3 doses as needed for chest pain. 25 tablet 6   No current facility-administered medications for this visit.    REVIEW OF SYSTEMS:   Constitutional: Denies fevers, chills or abnormal night sweats Eyes: Denies blurriness of vision, double vision or watery eyes Ears, nose, mouth, throat, and face: Denies mucositis or sore throat Gastrointestinal:  Denies nausea, heartburn or change in bowel habits Lymphatics: Denies new lymphadenopathy or easy bruising Neurological:Denies numbness, tingling or new weaknesses Behavioral/Psych: Mood is stable, no new changes  All other systems were reviewed with the patient and are negative.  PHYSICAL EXAMINATION: ECOG PERFORMANCE STATUS: 2 - Symptomatic, <50% confined to bed  Filed Vitals:   01/23/16 1240  BP: 121/62  Pulse: 56  Temp: 98 F (36.7 C)  Resp: 20   Filed Weights   01/23/16 1240  Weight: 280 lb 14.4 oz (127.415 kg)    GENERAL:alert, no distress and comfortable. She is morbidly obese SKIN: skin color, texture, turgor are normal, no rashes or significant lesions. She have chronic venous stasis changes EYES: normal, conjunctiva are pink and non-injected, sclera clear OROPHARYNX:no exudate, no erythema and lips, buccal mucosa, and tongue normal  NECK: supple, thyroid normal size, non-tender, without nodularity LYMPH:  no palpable lymphadenopathy in the cervical, axillary or inguinal LUNGS: She has increased breathing effort with mild bibasilar crackles HEART: regular rate & rhythm and no murmurs moderate bilateral lower extremity edema ABDOMEN:abdomen soft, non-tender and normal bowel sounds Musculoskeletal:no cyanosis of digits and no clubbing  PSYCH: alert & oriented x 3  with fluent speech NEURO: no focal motor/sensory deficits  LABORATORY DATA:  I have reviewed the data as listed CBC    Component Value Date/Time   WBC 16.6* 01/16/2016 0608   RBC 4.01 01/16/2016 0608   HGB 11.7* 01/16/2016 0608   HCT 36.8 01/16/2016 0608   PLT 271 01/16/2016 0608   MCV 91.8 01/16/2016 0608   MCH 29.2 01/16/2016 0608   MCHC 31.8 01/16/2016 0608   RDW 15.4 01/16/2016 0608   LYMPHSABS 1.0 01/14/2016 0505   MONOABS 0.8 01/14/2016 0505   EOSABS 0.0 01/14/2016 0505   BASOSABS 0.0 01/14/2016 0505    I review her recent hyper coag panel  RADIOGRAPHIC STUDIES: I have personally reviewed the radiological images as listed and agreed with the findings in the report. Ct Abdomen Pelvis W Contrast  01/14/2016  CLINICAL DATA:  60 year old female with left-sided abdominal pain, nausea, vomiting and constipation EXAM: CT ABDOMEN AND PELVIS WITH CONTRAST TECHNIQUE: Multidetector CT imaging of the abdomen and pelvis was performed using the standard protocol following bolus administration of intravenous contrast. CONTRAST:  124mL OMNIPAQUE IOHEXOL 300 MG/ML  SOLN COMPARISON:  Prior CT scan of the chest including the lung bases 06/18/2012 FINDINGS: Lower Chest: Respiratory motion limits evaluation for small pulmonary nodules. Mild mosaic attenuation in the lower lobes may represent areas of subsegmental atelectasis or air trapping. Cardiomegaly. Suggestion of contrast material within the inter atrial septum. Evaluation is limited by non gated technique but there could be a patent foramen ovale. Trace calcification of the mitral valve annulus. No evidence of pericardial effusion. Unremarkable visualized esophagus. Abdomen: Unremarkable CT appearance of the stomach, duodenum, adrenal glands and pancreas. There is significantly decreased attenuation in the upper pole of the spleen with a sharp geographic cut off in the mid aspect of the spleen. Overall, the upper 50% of the splenic parenchyma is  involved. These imaging findings are incompletely imaged but appear to persist on delayed imaging. This is concerning for splenic infarct. Mildly abnormal hepatic contour with blunting of the tip of the lateral segment. Additionally, the liver is diffusely low in attenuation consistent with hepatic steatosis. No discrete lesion is identified. The portal veins remain patent. Mildly prominent upper abdominal (gastrohepatic ligament, hepatic duodenum ligament) lymph nodes measuring up to 11 mm (image 26 series 2). Portacaval lymph node measures up to 17 mm in short axis on image 36 of series 2. Unremarkable appearance of the bilateral kidneys. No focal solid lesion, hydronephrosis or nephrolithiasis. Whole 0.9 cm simple cyst in the interpolar left kidney. 1.8  cm simple cyst in the lower pole of the left kidney. There are a few scattered colonic diverticula but no evidence of active diverticulitis. The terminal ileum is unremarkable. The appendix is not visualized and is presumed surgically absent. There is no inflammatory change in the right lower quadrant. No focal bowel wall thickening or evidence of obstruction. No free fluid. Pelvis: 1.8 cm low-attenuation cystic lesion in the left ovary. The uterus is unremarkable. The right ovary is unremarkable. Normal appearance of the bladder. No free fluid or suspicious adenopathy. Musculoskeletal: Omental fat containing periumbilical hernia. No acute fracture or aggressive appearing lytic or blastic osseous lesion. Chronic L5 pars defects bilaterally. Grade 1 anterolisthesis of L5 on S1 measuring approximately 4 mm. Vascular: Atherosclerotic vascular disease without significant stenosis or aneurysmal dilatation. IMPRESSION: 1. 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 While there is underlying atherosclerotic vascular disease, there is no irregular atherosclerotic plaque or wall adherent mural thrombus to suggesting an embolic source. Does the patient have a history  of atrial fibrillation? Additionally, there is a suggestion of a possible ASD or patent foramen ovale although this could easily be artifactual given the non cardiac gated nature of the study. Regardless, further evaluation with an echocardiographic bubble study to assess for right to left shunting is recommended. 2. Cardiomegaly. 3. Advanced hepatic steatosis with early morphologic cirrhotic change. Recommend correlation with serum LFTs. If there is evidence of transaminitis, recommend non emergent outpatient referral to hepatology/gastroenterology. 4. Prominent upper abdominal lymph nodes are favored to be reactive and related to the underlying liver disease. 5. Incidentally noted 18 mm simple appearing cystic lesion in the left ovary. This is almost certainly benign, but follow up ultrasound is recommended in 1 year according to the Society of Radiologists in [place] Statement (D Clovis Riley et al. Management of Asymptomatic Ovarian and Other Adnexal Cysts Imaged at [place]: Society of Radiologists in [place] Statement 2010. Radiology 256 (Sept 2010): 943-954.). 6. Omental fat containing periumbilical hernia. 7. Chronic bilateral L5 pars defects with grade 1 anterolisthesis of L5 on S1. These results were called by telephone at the time of interpretation on 01/14/2016 at 8:42 am to Dr. Billy Fischer, who verbally acknowledged these results. Electronically Signed   By: Jacqulynn Cadet M.D.   On: 01/14/2016 08:43   Ct Angio Chest Aorta W/cm &/or Wo/cm  01/14/2016  CLINICAL DATA:  60 year old female with lower extremity swelling and shortness of breath. Found have acute splenic infarct on CT Abdomen and Pelvis 0811 hours today. Initial encounter. EXAM: CT ANGIOGRAPHY CHEST, ABDOMEN AND PELVIS TECHNIQUE: Multidetector CT imaging through the chest, abdomen and pelvis was performed using the standard protocol during bolus administration of intravenous contrast. Multiplanar  reconstructed images and MIPs were obtained and reviewed to evaluate the vascular anatomy. CONTRAST:  100 mL Omnipaque 350 COMPARISON:  Contrast-enhanced CT Abdomen and Pelvis 811 hours today. Chest CTA 06/18/2012. FINDINGS: CTA CHEST FINDINGS Large body habitus. Lower lung volumes compared to 2013 with widespread pulmonary septal thickening and ground-glass opacity. No pleural effusion. No consolidation. Central airways are patent aside from atelectatic changes. Negative thoracic inlet. No axillary lymphadenopathy. Chronic mild to moderate mediastinal and hilar lymphadenopathy has not significantly changed since 2013. No pleural or pericardial effusion. Osteopenia and bulky degenerative osteophytosis in the thoracic spine. New since 2013 is erosion with irregular margins of the T8-T9 in plates (sagittal image 96) there appears to be chronic interbody fusion at this level via bulky right antral lateral endplate osteophytes. There  is no retropulsion of bone or abnormal paraspinal soft tissue here. No other acute or suspicious osseous lesion in the chest. VASCULAR FINDINGS: Central pulmonary artery enlargement (up to 42 mm diameter main pulmonary artery versus adjacent ascending aorta measuring 33 mm diameter) is stable since 2013. No central or hilar pulmonary artery filling defect. Thoracic aorta is remarkable for scattered soft and calcified atherosclerotic plaque, most apparent in the arch (series 8, image 109) which is mildly progressed since 2013. Great vessel origins are patent. Calcified coronary artery atherosclerosis is evident. No thoracic aortic dissection or aneurysm. Review of the MIP images confirms the above findings. CTA ABDOMEN AND PELVIS FINDINGS Chronic L5 pars fractures with grade 1 spondylolisthesis. Osteopenia. No acute osseous abnormality identified. Large volume of excreted IV contrast in the urinary bladder which results in streak artifact both through the pelvis. No pelvic free fluid is  evident. Visualized pelvic and abdominal viscera appear stable since this morning. Hypo enhancement of the superior half of the spleen re- demonstrated. No abdominal free fluid. Mild upper abdominal lymphadenopathy again noted. VASCULAR FINDINGS: Intermittent soft and calcified atherosclerosis of the abdominal aorta. Major abdominal aortic branches remain patent including the celiac artery and main splenic artery. Major SMA branches appear patent. There does appear to be atherosclerotic stenosis at the IMA origin which remains patent. Calcified plaque continues into the bilateral iliac arteries. The visualized bilateral femoral artery is are patent with mild calcified plaque. No arterial dissection or aneurysm identified. Review of the MIP images confirms the above findings. IMPRESSION: 1. Intermittent soft and calcified aortic atherosclerosis in the chest and abdomen. Major aortic branches including the celiac and main splenic artery remain patent. There does appear to be atherosclerotic stenosis at the IMA origin. 2. Chronic pulmonary artery hypertension, with stable main pulmonary artery enlargement since 2013. 3. Pulmonary ground-glass opacity and septal thickening is nonspecific with top differential considerations of pulmonary edema and atypical infection. No pleural effusion. 4. Stable visualized abdominal viscera since CT Abdomen and Pelvis this morning, splenic infarct re- demonstrated. 5. New T8-T9 vertebral endplate erosions since 2013. This is nonspecific but benign or degenerative etiology is favored over infectious or malignant process. Thoracic spine MRI (without and with contrast preferred) may characterize further. 6. Chronic nonspecific mediastinal lymphadenopathy, stable since 2013. Electronically Signed   By: Genevie Ann M.D.   On: 01/14/2016 13:47   Ct Angio Abd/pel W/ And/or W/o  01/14/2016  CLINICAL DATA:  60 year old female with lower extremity swelling and shortness of breath. Found have  acute splenic infarct on CT Abdomen and Pelvis 0811 hours today. Initial encounter. EXAM: CT ANGIOGRAPHY CHEST, ABDOMEN AND PELVIS TECHNIQUE: Multidetector CT imaging through the chest, abdomen and pelvis was performed using the standard protocol during bolus administration of intravenous contrast. Multiplanar reconstructed images and MIPs were obtained and reviewed to evaluate the vascular anatomy. CONTRAST:  100 mL Omnipaque 350 COMPARISON:  Contrast-enhanced CT Abdomen and Pelvis 811 hours today. Chest CTA 06/18/2012. FINDINGS: CTA CHEST FINDINGS Large body habitus. Lower lung volumes compared to 2013 with widespread pulmonary septal thickening and ground-glass opacity. No pleural effusion. No consolidation. Central airways are patent aside from atelectatic changes. Negative thoracic inlet. No axillary lymphadenopathy. Chronic mild to moderate mediastinal and hilar lymphadenopathy has not significantly changed since 2013. No pleural or pericardial effusion. Osteopenia and bulky degenerative osteophytosis in the thoracic spine. New since 2013 is erosion with irregular margins of the T8-T9 in plates (sagittal image 96) there appears to be chronic interbody fusion at this  level via bulky right antral lateral endplate osteophytes. There is no retropulsion of bone or abnormal paraspinal soft tissue here. No other acute or suspicious osseous lesion in the chest. VASCULAR FINDINGS: Central pulmonary artery enlargement (up to 42 mm diameter main pulmonary artery versus adjacent ascending aorta measuring 33 mm diameter) is stable since 2013. No central or hilar pulmonary artery filling defect. Thoracic aorta is remarkable for scattered soft and calcified atherosclerotic plaque, most apparent in the arch (series 8, image 109) which is mildly progressed since 2013. Great vessel origins are patent. Calcified coronary artery atherosclerosis is evident. No thoracic aortic dissection or aneurysm. Review of the MIP images  confirms the above findings. CTA ABDOMEN AND PELVIS FINDINGS Chronic L5 pars fractures with grade 1 spondylolisthesis. Osteopenia. No acute osseous abnormality identified. Large volume of excreted IV contrast in the urinary bladder which results in streak artifact both through the pelvis. No pelvic free fluid is evident. Visualized pelvic and abdominal viscera appear stable since this morning. Hypo enhancement of the superior half of the spleen re- demonstrated. No abdominal free fluid. Mild upper abdominal lymphadenopathy again noted. VASCULAR FINDINGS: Intermittent soft and calcified atherosclerosis of the abdominal aorta. Major abdominal aortic branches remain patent including the celiac artery and main splenic artery. Major SMA branches appear patent. There does appear to be atherosclerotic stenosis at the IMA origin which remains patent. Calcified plaque continues into the bilateral iliac arteries. The visualized bilateral femoral artery is are patent with mild calcified plaque. No arterial dissection or aneurysm identified. Review of the MIP images confirms the above findings. IMPRESSION: 1. Intermittent soft and calcified aortic atherosclerosis in the chest and abdomen. Major aortic branches including the celiac and main splenic artery remain patent. There does appear to be atherosclerotic stenosis at the IMA origin. 2. Chronic pulmonary artery hypertension, with stable main pulmonary artery enlargement since 2013. 3. Pulmonary ground-glass opacity and septal thickening is nonspecific with top differential considerations of pulmonary edema and atypical infection. No pleural effusion. 4. Stable visualized abdominal viscera since CT Abdomen and Pelvis this morning, splenic infarct re- demonstrated. 5. New T8-T9 vertebral endplate erosions since 2013. This is nonspecific but benign or degenerative etiology is favored over infectious or malignant process. Thoracic spine MRI (without and with contrast preferred)  may characterize further. 6. Chronic nonspecific mediastinal lymphadenopathy, stable since 2013. Electronically Signed   By: Genevie Ann M.D.   On: 01/14/2016 13:47    ASSESSMENT & PLAN:  Splenic infarct After reviewing her case extensively, I felt that the most likely cause of splenic infarct could be related to splenic congestion from liver cirrhosis with possibility of component of peripheral vascular disease due to poorly controlled diabetes and mild congestive heart failure. I'm also concerned that she may have chronic infarcts causing pulmonary hypertension. Bilateral lower venous Doppler ultrasound performed recently showed no evidence of DVT. She has hypercoagulable panel performed in the hospital with negative results. I did not see lupus anticoagulant testing performed but I would not pursue this as this would not change medical decision Overall, I think she needs to remain on chronic anticoagulation therapy for minimum of 6 months. She is taking aspirin to prevent risk of heart disease and stroke and that could predispose her to increased risk of bleeding. She had mild abnormal-looking stool which I suspect could be due to antibiotic therapy. I told her to discontinue antibiotic therapy and naproxen. However, if she still felt she may have passage of dark stool, then I recommend she discontinue  aspirin but to remain on Xarelto. She needs to inform me if she see evidence of bleeding.  Chronic diastolic CHF (congestive heart failure) (HCC) She has evidence of pulmonary hypertension seen on recent CT angiogram, shortness of breath and evidence of bilateral lower extremity edema. Echocardiogram show evidence of diastolic dysfunction. Apparently, she has been referred to see a cardiologist. I would defer to them for further management.  Morbid obesity ([place]) Her BMI is over 40. She has evidence of fatty liver disease with early cirrhosis and poorly controlled diabetes. I recommend dietary  modification and close follow-up for diabetes management with her primary care doctor  Insulin dependent diabetes mellitus ([place]) She has poorly controlled diabetes with hemoglobin A1c greater than 9%. Her BMI is high. I recommend close follow-up with primary care doctor for medical management  Hepatic cirrhosis ([place]) Recent CT scan shows significant fatty liver disease with possible early changes of cirrhosis. The patient is morbidly obese. I recommend dietary modification and weight loss  Pulmonary hypertension assoc with unclear multi-factorial mechanisms ([place]) CT scan angiogram show evidence of pulmonary hypertension. Chronic thromboembolic disease cannot be ruled out. I recommend she continue on anticoagulation therapy for now I recommend she follows closely with cardiologist  Pulmonary infiltrates She has evidence of pulmonary infiltrates which look a little worse compared to her prior CT scan from 2013. She has evidence of shortness of breath on minimal exertion and pulmonary hypertension on recent CT angiogram. I recommend she resume follow-up with her pulmonologist for further evaluation and management. The patient denies recent smoking     Orders Placed This Encounter  Procedures  . CBC with Differential/Platelet    Standing Status: Future     Number of Occurrences:      Standing Expiration Date: 02/26/2017  . Comprehensive metabolic panel    Standing Status: Future     Number of Occurrences:      Standing Expiration Date: 02/26/2017    All questions were answered. The patient knows to call the clinic with any problems, questions or concerns. I spent 40 minutes counseling the patient face to face. The total time spent in the appointment was 60 minutes and more than 50% was on counseling.     [facility], [place], MD 01/23/2016 3:23 PM

## 2016-01-31 ENCOUNTER — Encounter (HOSPITAL_COMMUNITY): Payer: Self-pay | Admitting: Vascular Surgery

## 2016-01-31 DIAGNOSIS — I5033 Acute on chronic diastolic (congestive) heart failure: Secondary | ICD-10-CM | POA: Diagnosis not present

## 2016-01-31 DIAGNOSIS — Z823 Family history of stroke: Secondary | ICD-10-CM

## 2016-01-31 DIAGNOSIS — R197 Diarrhea, unspecified: Secondary | ICD-10-CM | POA: Diagnosis present

## 2016-01-31 DIAGNOSIS — Z91040 Latex allergy status: Secondary | ICD-10-CM

## 2016-01-31 DIAGNOSIS — Z7982 Long term (current) use of aspirin: Secondary | ICD-10-CM

## 2016-01-31 DIAGNOSIS — R414 Neurologic neglect syndrome: Secondary | ICD-10-CM | POA: Diagnosis not present

## 2016-01-31 DIAGNOSIS — R29719 NIHSS score 19: Secondary | ICD-10-CM | POA: Diagnosis not present

## 2016-01-31 DIAGNOSIS — Z794 Long term (current) use of insulin: Secondary | ICD-10-CM

## 2016-01-31 DIAGNOSIS — T424X5A Adverse effect of benzodiazepines, initial encounter: Secondary | ICD-10-CM | POA: Diagnosis present

## 2016-01-31 DIAGNOSIS — Z885 Allergy status to narcotic agent status: Secondary | ICD-10-CM

## 2016-01-31 DIAGNOSIS — G0490 Encephalitis and encephalomyelitis, unspecified: Secondary | ICD-10-CM | POA: Diagnosis present

## 2016-01-31 DIAGNOSIS — R1310 Dysphagia, unspecified: Secondary | ICD-10-CM | POA: Diagnosis not present

## 2016-01-31 DIAGNOSIS — K7460 Unspecified cirrhosis of liver: Secondary | ICD-10-CM | POA: Diagnosis present

## 2016-01-31 DIAGNOSIS — J9601 Acute respiratory failure with hypoxia: Secondary | ICD-10-CM | POA: Diagnosis not present

## 2016-01-31 DIAGNOSIS — E785 Hyperlipidemia, unspecified: Secondary | ICD-10-CM | POA: Diagnosis present

## 2016-01-31 DIAGNOSIS — D62 Acute posthemorrhagic anemia: Secondary | ICD-10-CM | POA: Diagnosis not present

## 2016-01-31 DIAGNOSIS — I97821 Postprocedural cerebrovascular infarction during other surgery: Secondary | ICD-10-CM | POA: Diagnosis not present

## 2016-01-31 DIAGNOSIS — R471 Dysarthria and anarthria: Secondary | ICD-10-CM | POA: Diagnosis not present

## 2016-01-31 DIAGNOSIS — I330 Acute and subacute infective endocarditis: Principal | ICD-10-CM | POA: Diagnosis present

## 2016-01-31 DIAGNOSIS — H547 Unspecified visual loss: Secondary | ICD-10-CM | POA: Diagnosis present

## 2016-01-31 DIAGNOSIS — T433X5A Adverse effect of phenothiazine antipsychotics and neuroleptics, initial encounter: Secondary | ICD-10-CM | POA: Diagnosis present

## 2016-01-31 DIAGNOSIS — M1990 Unspecified osteoarthritis, unspecified site: Secondary | ICD-10-CM | POA: Diagnosis present

## 2016-01-31 DIAGNOSIS — E1165 Type 2 diabetes mellitus with hyperglycemia: Secondary | ICD-10-CM | POA: Diagnosis not present

## 2016-01-31 DIAGNOSIS — Z7984 Long term (current) use of oral hypoglycemic drugs: Secondary | ICD-10-CM

## 2016-01-31 DIAGNOSIS — K7581 Nonalcoholic steatohepatitis (NASH): Secondary | ICD-10-CM | POA: Diagnosis present

## 2016-01-31 DIAGNOSIS — Z825 Family history of asthma and other chronic lower respiratory diseases: Secondary | ICD-10-CM

## 2016-01-31 DIAGNOSIS — I272 Other secondary pulmonary hypertension: Secondary | ICD-10-CM | POA: Diagnosis present

## 2016-01-31 DIAGNOSIS — G8194 Hemiplegia, unspecified affecting left nondominant side: Secondary | ICD-10-CM | POA: Diagnosis present

## 2016-01-31 DIAGNOSIS — D649 Anemia, unspecified: Secondary | ICD-10-CM | POA: Diagnosis present

## 2016-01-31 DIAGNOSIS — I6521 Occlusion and stenosis of right carotid artery: Secondary | ICD-10-CM | POA: Diagnosis not present

## 2016-01-31 DIAGNOSIS — B954 Other streptococcus as the cause of diseases classified elsewhere: Secondary | ICD-10-CM | POA: Diagnosis present

## 2016-01-31 DIAGNOSIS — D735 Infarction of spleen: Secondary | ICD-10-CM | POA: Diagnosis not present

## 2016-01-31 DIAGNOSIS — K0530 Chronic periodontitis, unspecified: Secondary | ICD-10-CM | POA: Diagnosis present

## 2016-01-31 DIAGNOSIS — R4182 Altered mental status, unspecified: Secondary | ICD-10-CM | POA: Diagnosis present

## 2016-01-31 DIAGNOSIS — J45909 Unspecified asthma, uncomplicated: Secondary | ICD-10-CM | POA: Diagnosis present

## 2016-01-31 DIAGNOSIS — F410 Panic disorder [episodic paroxysmal anxiety] without agoraphobia: Secondary | ICD-10-CM | POA: Diagnosis present

## 2016-01-31 DIAGNOSIS — K449 Diaphragmatic hernia without obstruction or gangrene: Secondary | ICD-10-CM | POA: Diagnosis present

## 2016-01-31 DIAGNOSIS — E119 Type 2 diabetes mellitus without complications: Secondary | ICD-10-CM | POA: Diagnosis present

## 2016-01-31 DIAGNOSIS — Z79899 Other long term (current) drug therapy: Secondary | ICD-10-CM

## 2016-01-31 DIAGNOSIS — E871 Hypo-osmolality and hyponatremia: Secondary | ICD-10-CM | POA: Diagnosis present

## 2016-01-31 DIAGNOSIS — Z7901 Long term (current) use of anticoagulants: Secondary | ICD-10-CM

## 2016-01-31 DIAGNOSIS — I63411 Cerebral infarction due to embolism of right middle cerebral artery: Secondary | ICD-10-CM | POA: Diagnosis not present

## 2016-01-31 DIAGNOSIS — I10 Essential (primary) hypertension: Secondary | ICD-10-CM | POA: Diagnosis present

## 2016-01-31 DIAGNOSIS — Z8249 Family history of ischemic heart disease and other diseases of the circulatory system: Secondary | ICD-10-CM

## 2016-01-31 DIAGNOSIS — Z6841 Body Mass Index (BMI) 40.0 and over, adult: Secondary | ICD-10-CM

## 2016-01-31 DIAGNOSIS — Y92230 Patient room in hospital as the place of occurrence of the external cause: Secondary | ICD-10-CM | POA: Diagnosis not present

## 2016-01-31 DIAGNOSIS — I609 Nontraumatic subarachnoid hemorrhage, unspecified: Secondary | ICD-10-CM | POA: Diagnosis present

## 2016-01-31 DIAGNOSIS — J449 Chronic obstructive pulmonary disease, unspecified: Secondary | ICD-10-CM | POA: Diagnosis present

## 2016-01-31 DIAGNOSIS — Z87891 Personal history of nicotine dependence: Secondary | ICD-10-CM

## 2016-01-31 DIAGNOSIS — I739 Peripheral vascular disease, unspecified: Secondary | ICD-10-CM | POA: Diagnosis present

## 2016-01-31 DIAGNOSIS — W010XXA Fall on same level from slipping, tripping and stumbling without subsequent striking against object, initial encounter: Secondary | ICD-10-CM | POA: Diagnosis not present

## 2016-01-31 DIAGNOSIS — I76 Septic arterial embolism: Secondary | ICD-10-CM | POA: Diagnosis present

## 2016-01-31 LAB — I-STAT CHEM 8, ED
BUN: 15 mg/dL (ref 6–20)
CHLORIDE: 94 mmol/L — AB (ref 101–111)
Calcium, Ion: 1.28 mmol/L — ABNORMAL HIGH (ref 1.12–1.23)
Creatinine, Ser: 1.1 mg/dL — ABNORMAL HIGH (ref 0.44–1.00)
GLUCOSE: 211 mg/dL — AB (ref 65–99)
HEMATOCRIT: 43 % (ref 36.0–46.0)
HEMOGLOBIN: 14.6 g/dL (ref 12.0–15.0)
Potassium: 4.6 mmol/L (ref 3.5–5.1)
SODIUM: 132 mmol/L — AB (ref 135–145)
TCO2: 25 mmol/L (ref 0–100)

## 2016-01-31 LAB — DIFFERENTIAL
BASOS PCT: 0 %
Basophils Absolute: 0 10*3/uL (ref 0.0–0.1)
EOS ABS: 0.1 10*3/uL (ref 0.0–0.7)
EOS PCT: 1 %
Lymphocytes Relative: 10 %
Lymphs Abs: 1.7 10*3/uL (ref 0.7–4.0)
MONO ABS: 0.9 10*3/uL (ref 0.1–1.0)
MONOS PCT: 5 %
NEUTROS ABS: 15.2 10*3/uL — AB (ref 1.7–7.7)
Neutrophils Relative %: 84 %

## 2016-01-31 LAB — COMPREHENSIVE METABOLIC PANEL
ALT: 12 U/L — ABNORMAL LOW (ref 14–54)
ANION GAP: 11 (ref 5–15)
AST: 18 U/L (ref 15–41)
Albumin: 3.1 g/dL — ABNORMAL LOW (ref 3.5–5.0)
Alkaline Phosphatase: 75 U/L (ref 38–126)
BUN: 13 mg/dL (ref 6–20)
CHLORIDE: 94 mmol/L — AB (ref 101–111)
CO2: 26 mmol/L (ref 22–32)
CREATININE: 1.13 mg/dL — AB (ref 0.44–1.00)
Calcium: 10.2 mg/dL (ref 8.9–10.3)
GFR, EST NON AFRICAN AMERICAN: 52 mL/min — AB (ref >60)
Glucose, Bld: 215 mg/dL — ABNORMAL HIGH (ref 65–99)
POTASSIUM: 4.9 mmol/L (ref 3.5–5.1)
SODIUM: 131 mmol/L — AB (ref 135–145)
Total Bilirubin: 0.5 mg/dL (ref 0.3–1.2)
Total Protein: 7.2 g/dL (ref 6.5–8.1)

## 2016-01-31 LAB — APTT: aPTT: 36 seconds (ref 24–37)

## 2016-01-31 LAB — CBC
HEMATOCRIT: 40.9 % (ref 36.0–46.0)
Hemoglobin: 13 g/dL (ref 12.0–15.0)
MCH: 27.9 pg (ref 26.0–34.0)
MCHC: 31.8 g/dL (ref 30.0–36.0)
MCV: 87.8 fL (ref 78.0–100.0)
PLATELETS: 478 10*3/uL — AB (ref 150–400)
RBC: 4.66 MIL/uL (ref 3.87–5.11)
RDW: 15 % (ref 11.5–15.5)
WBC: 17.9 10*3/uL — AB (ref 4.0–10.5)

## 2016-01-31 LAB — I-STAT TROPONIN, ED: TROPONIN I, POC: 0.01 ng/mL (ref 0.00–0.08)

## 2016-01-31 LAB — PROTIME-INR
INR: 2.16 — ABNORMAL HIGH (ref 0.00–1.49)
PROTHROMBIN TIME: 23.9 s — AB (ref 11.6–15.2)

## 2016-01-31 MED ORDER — ONDANSETRON 4 MG PO TBDP
4.0000 mg | ORAL_TABLET | Freq: Once | ORAL | Status: AC | PRN
  Administered 2016-01-31: 4 mg via ORAL

## 2016-01-31 MED ORDER — ONDANSETRON 4 MG PO TBDP
ORAL_TABLET | ORAL | Status: AC
  Filled 2016-01-31: qty 1

## 2016-01-31 NOTE — ED Notes (Signed)
Pt reports to the ED for eval of blurred vision and she states there is a spot in her vision. LSN last night. Reports she has a HA and dizziness with standing as well. She also reports some associated nausea but denies any V/D. Pt A&Ox4, resp e/u, and skin warm and dry.

## 2016-02-01 ENCOUNTER — Emergency Department (HOSPITAL_COMMUNITY): Payer: Medicaid Other

## 2016-02-01 ENCOUNTER — Inpatient Hospital Stay (HOSPITAL_COMMUNITY): Payer: Medicaid Other

## 2016-02-01 ENCOUNTER — Encounter (HOSPITAL_COMMUNITY): Payer: Self-pay | Admitting: Internal Medicine

## 2016-02-01 ENCOUNTER — Inpatient Hospital Stay (HOSPITAL_COMMUNITY)
Admission: EM | Admit: 2016-02-01 | Discharge: 2016-02-14 | DRG: 252 | Disposition: A | Discharge status: Home Health | Payer: Medicaid Other | Attending: Internal Medicine | Admitting: Internal Medicine

## 2016-02-01 DIAGNOSIS — G8194 Hemiplegia, unspecified affecting left nondominant side: Secondary | ICD-10-CM | POA: Diagnosis present

## 2016-02-01 DIAGNOSIS — R414 Neurologic neglect syndrome: Secondary | ICD-10-CM | POA: Diagnosis not present

## 2016-02-01 DIAGNOSIS — W19XXXA Unspecified fall, initial encounter: Secondary | ICD-10-CM

## 2016-02-01 DIAGNOSIS — J449 Chronic obstructive pulmonary disease, unspecified: Secondary | ICD-10-CM | POA: Diagnosis present

## 2016-02-01 DIAGNOSIS — I609 Nontraumatic subarachnoid hemorrhage, unspecified: Secondary | ICD-10-CM | POA: Diagnosis present

## 2016-02-01 DIAGNOSIS — I058 Other rheumatic mitral valve diseases: Secondary | ICD-10-CM | POA: Diagnosis not present

## 2016-02-01 DIAGNOSIS — I6340 Cerebral infarction due to embolism of unspecified cerebral artery: Secondary | ICD-10-CM | POA: Diagnosis present

## 2016-02-01 DIAGNOSIS — I10 Essential (primary) hypertension: Secondary | ICD-10-CM | POA: Diagnosis present

## 2016-02-01 DIAGNOSIS — B954 Other streptococcus as the cause of diseases classified elsewhere: Secondary | ICD-10-CM | POA: Diagnosis present

## 2016-02-01 DIAGNOSIS — R51 Headache: Secondary | ICD-10-CM | POA: Diagnosis present

## 2016-02-01 DIAGNOSIS — I272 Other secondary pulmonary hypertension: Secondary | ICD-10-CM | POA: Diagnosis present

## 2016-02-01 DIAGNOSIS — Y92230 Patient room in hospital as the place of occurrence of the external cause: Secondary | ICD-10-CM | POA: Diagnosis not present

## 2016-02-01 DIAGNOSIS — R471 Dysarthria and anarthria: Secondary | ICD-10-CM | POA: Diagnosis not present

## 2016-02-01 DIAGNOSIS — J9601 Acute respiratory failure with hypoxia: Secondary | ICD-10-CM

## 2016-02-01 DIAGNOSIS — F410 Panic disorder [episodic paroxysmal anxiety] without agoraphobia: Secondary | ICD-10-CM | POA: Diagnosis present

## 2016-02-01 DIAGNOSIS — H547 Unspecified visual loss: Secondary | ICD-10-CM | POA: Diagnosis present

## 2016-02-01 DIAGNOSIS — I63411 Cerebral infarction due to embolism of right middle cerebral artery: Secondary | ICD-10-CM | POA: Diagnosis not present

## 2016-02-01 DIAGNOSIS — K0530 Chronic periodontitis, unspecified: Secondary | ICD-10-CM | POA: Diagnosis present

## 2016-02-01 DIAGNOSIS — M1990 Unspecified osteoarthritis, unspecified site: Secondary | ICD-10-CM | POA: Diagnosis present

## 2016-02-01 DIAGNOSIS — E119 Type 2 diabetes mellitus without complications: Secondary | ICD-10-CM | POA: Diagnosis present

## 2016-02-01 DIAGNOSIS — R197 Diarrhea, unspecified: Secondary | ICD-10-CM

## 2016-02-01 DIAGNOSIS — W010XXA Fall on same level from slipping, tripping and stumbling without subsequent striking against object, initial encounter: Secondary | ICD-10-CM | POA: Diagnosis not present

## 2016-02-01 DIAGNOSIS — J9600 Acute respiratory failure, unspecified whether with hypoxia or hypercapnia: Secondary | ICD-10-CM

## 2016-02-01 DIAGNOSIS — K7581 Nonalcoholic steatohepatitis (NASH): Secondary | ICD-10-CM | POA: Diagnosis present

## 2016-02-01 DIAGNOSIS — D735 Infarction of spleen: Secondary | ICD-10-CM | POA: Diagnosis not present

## 2016-02-01 DIAGNOSIS — A491 Streptococcal infection, unspecified site: Secondary | ICD-10-CM | POA: Diagnosis present

## 2016-02-01 DIAGNOSIS — I059 Rheumatic mitral valve disease, unspecified: Secondary | ICD-10-CM | POA: Diagnosis present

## 2016-02-01 DIAGNOSIS — R7881 Bacteremia: Secondary | ICD-10-CM | POA: Diagnosis present

## 2016-02-01 DIAGNOSIS — K7460 Unspecified cirrhosis of liver: Secondary | ICD-10-CM | POA: Diagnosis present

## 2016-02-01 DIAGNOSIS — D62 Acute posthemorrhagic anemia: Secondary | ICD-10-CM | POA: Insufficient documentation

## 2016-02-01 DIAGNOSIS — I2729 Other secondary pulmonary hypertension: Secondary | ICD-10-CM | POA: Diagnosis present

## 2016-02-01 DIAGNOSIS — R930 Abnormal findings on diagnostic imaging of skull and head, not elsewhere classified: Secondary | ICD-10-CM

## 2016-02-01 DIAGNOSIS — R0602 Shortness of breath: Secondary | ICD-10-CM

## 2016-02-01 DIAGNOSIS — I739 Peripheral vascular disease, unspecified: Secondary | ICD-10-CM | POA: Diagnosis present

## 2016-02-01 DIAGNOSIS — R52 Pain, unspecified: Secondary | ICD-10-CM

## 2016-02-01 DIAGNOSIS — I63319 Cerebral infarction due to thrombosis of unspecified middle cerebral artery: Secondary | ICD-10-CM

## 2016-02-01 DIAGNOSIS — Z91040 Latex allergy status: Secondary | ICD-10-CM | POA: Diagnosis not present

## 2016-02-01 DIAGNOSIS — Z794 Long term (current) use of insulin: Secondary | ICD-10-CM | POA: Diagnosis not present

## 2016-02-01 DIAGNOSIS — Z823 Family history of stroke: Secondary | ICD-10-CM | POA: Diagnosis not present

## 2016-02-01 DIAGNOSIS — I97821 Postprocedural cerebrovascular infarction during other surgery: Secondary | ICD-10-CM | POA: Diagnosis not present

## 2016-02-01 DIAGNOSIS — R29719 NIHSS score 19: Secondary | ICD-10-CM | POA: Diagnosis not present

## 2016-02-01 DIAGNOSIS — T424X5A Adverse effect of benzodiazepines, initial encounter: Secondary | ICD-10-CM | POA: Diagnosis present

## 2016-02-01 DIAGNOSIS — Z6841 Body Mass Index (BMI) 40.0 and over, adult: Secondary | ICD-10-CM | POA: Diagnosis not present

## 2016-02-01 DIAGNOSIS — I76 Septic arterial embolism: Secondary | ICD-10-CM | POA: Diagnosis present

## 2016-02-01 DIAGNOSIS — R1013 Epigastric pain: Secondary | ICD-10-CM

## 2016-02-01 DIAGNOSIS — Z885 Allergy status to narcotic agent status: Secondary | ICD-10-CM | POA: Diagnosis not present

## 2016-02-01 DIAGNOSIS — G0490 Encephalitis and encephalomyelitis, unspecified: Secondary | ICD-10-CM | POA: Diagnosis present

## 2016-02-01 DIAGNOSIS — R1310 Dysphagia, unspecified: Secondary | ICD-10-CM | POA: Diagnosis not present

## 2016-02-01 DIAGNOSIS — I5033 Acute on chronic diastolic (congestive) heart failure: Secondary | ICD-10-CM | POA: Diagnosis not present

## 2016-02-01 DIAGNOSIS — I6521 Occlusion and stenosis of right carotid artery: Secondary | ICD-10-CM | POA: Diagnosis not present

## 2016-02-01 DIAGNOSIS — Z7901 Long term (current) use of anticoagulants: Secondary | ICD-10-CM | POA: Diagnosis not present

## 2016-02-01 DIAGNOSIS — Z825 Family history of asthma and other chronic lower respiratory diseases: Secondary | ICD-10-CM | POA: Diagnosis not present

## 2016-02-01 DIAGNOSIS — D649 Anemia, unspecified: Secondary | ICD-10-CM | POA: Diagnosis present

## 2016-02-01 DIAGNOSIS — IMO0001 Reserved for inherently not codable concepts without codable children: Secondary | ICD-10-CM

## 2016-02-01 DIAGNOSIS — Z87891 Personal history of nicotine dependence: Secondary | ICD-10-CM | POA: Diagnosis not present

## 2016-02-01 DIAGNOSIS — I63419 Cerebral infarction due to embolism of unspecified middle cerebral artery: Secondary | ICD-10-CM

## 2016-02-01 DIAGNOSIS — H539 Unspecified visual disturbance: Secondary | ICD-10-CM

## 2016-02-01 DIAGNOSIS — Z7984 Long term (current) use of oral hypoglycemic drugs: Secondary | ICD-10-CM | POA: Diagnosis not present

## 2016-02-01 DIAGNOSIS — R10816 Epigastric abdominal tenderness: Secondary | ICD-10-CM

## 2016-02-01 DIAGNOSIS — E871 Hypo-osmolality and hyponatremia: Secondary | ICD-10-CM | POA: Diagnosis present

## 2016-02-01 DIAGNOSIS — R0682 Tachypnea, not elsewhere classified: Secondary | ICD-10-CM | POA: Insufficient documentation

## 2016-02-01 DIAGNOSIS — Z792 Long term (current) use of antibiotics: Secondary | ICD-10-CM

## 2016-02-01 DIAGNOSIS — Z7982 Long term (current) use of aspirin: Secondary | ICD-10-CM | POA: Diagnosis not present

## 2016-02-01 DIAGNOSIS — E785 Hyperlipidemia, unspecified: Secondary | ICD-10-CM | POA: Diagnosis present

## 2016-02-01 DIAGNOSIS — I639 Cerebral infarction, unspecified: Secondary | ICD-10-CM

## 2016-02-01 DIAGNOSIS — J45909 Unspecified asthma, uncomplicated: Secondary | ICD-10-CM | POA: Diagnosis present

## 2016-02-01 DIAGNOSIS — R768 Other specified abnormal immunological findings in serum: Secondary | ICD-10-CM | POA: Diagnosis present

## 2016-02-01 DIAGNOSIS — I5032 Chronic diastolic (congestive) heart failure: Secondary | ICD-10-CM | POA: Diagnosis present

## 2016-02-01 DIAGNOSIS — R4781 Slurred speech: Secondary | ICD-10-CM

## 2016-02-01 DIAGNOSIS — Z8249 Family history of ischemic heart disease and other diseases of the circulatory system: Secondary | ICD-10-CM | POA: Diagnosis not present

## 2016-02-01 DIAGNOSIS — Z79899 Other long term (current) drug therapy: Secondary | ICD-10-CM | POA: Diagnosis not present

## 2016-02-01 DIAGNOSIS — I330 Acute and subacute infective endocarditis: Secondary | ICD-10-CM | POA: Diagnosis present

## 2016-02-01 DIAGNOSIS — K449 Diaphragmatic hernia without obstruction or gangrene: Secondary | ICD-10-CM | POA: Diagnosis present

## 2016-02-01 DIAGNOSIS — R4182 Altered mental status, unspecified: Secondary | ICD-10-CM | POA: Diagnosis present

## 2016-02-01 DIAGNOSIS — K7469 Other cirrhosis of liver: Secondary | ICD-10-CM

## 2016-02-01 DIAGNOSIS — T433X5A Adverse effect of phenothiazine antipsychotics and neuroleptics, initial encounter: Secondary | ICD-10-CM | POA: Diagnosis present

## 2016-02-01 DIAGNOSIS — Q251 Coarctation of aorta: Secondary | ICD-10-CM

## 2016-02-01 DIAGNOSIS — I509 Heart failure, unspecified: Secondary | ICD-10-CM

## 2016-02-01 DIAGNOSIS — K089 Disorder of teeth and supporting structures, unspecified: Secondary | ICD-10-CM

## 2016-02-01 DIAGNOSIS — E1165 Type 2 diabetes mellitus with hyperglycemia: Secondary | ICD-10-CM | POA: Diagnosis not present

## 2016-02-01 DIAGNOSIS — D72829 Elevated white blood cell count, unspecified: Secondary | ICD-10-CM | POA: Diagnosis present

## 2016-02-01 HISTORY — DX: Chronic diastolic (congestive) heart failure: I50.32

## 2016-02-01 HISTORY — DX: Infarction of spleen: D73.5

## 2016-02-01 HISTORY — DX: Nontraumatic subarachnoid hemorrhage, unspecified: I60.9

## 2016-02-01 HISTORY — DX: Other secondary pulmonary hypertension: I27.29

## 2016-02-01 HISTORY — DX: Acute and subacute infective endocarditis: I33.0

## 2016-02-01 HISTORY — DX: Other specified abnormal immunological findings in serum: R76.8

## 2016-02-01 HISTORY — DX: Cerebral infarction, unspecified: I63.9

## 2016-02-01 LAB — URINALYSIS, ROUTINE W REFLEX MICROSCOPIC
BILIRUBIN URINE: NEGATIVE
GLUCOSE, UA: NEGATIVE mg/dL
Ketones, ur: NEGATIVE mg/dL
Leukocytes, UA: NEGATIVE
Nitrite: NEGATIVE
PH: 5 (ref 5.0–8.0)
Protein, ur: NEGATIVE mg/dL
SPECIFIC GRAVITY, URINE: 1.014 (ref 1.005–1.030)

## 2016-02-01 LAB — TROPONIN I
TROPONIN I: 0.04 ng/mL — AB (ref <0.031)
TROPONIN I: 0.05 ng/mL — AB (ref <0.031)
Troponin I: 0.03 ng/mL (ref <0.031)

## 2016-02-01 LAB — GLUCOSE, CAPILLARY
Glucose-Capillary: 209 mg/dL — ABNORMAL HIGH (ref 65–99)
Glucose-Capillary: 274 mg/dL — ABNORMAL HIGH (ref 65–99)
Glucose-Capillary: 265 mg/dL — ABNORMAL HIGH (ref 65–99)

## 2016-02-01 LAB — URINE MICROSCOPIC-ADD ON

## 2016-02-01 LAB — INFLUENZA PANEL BY PCR (TYPE A & B)
H1N1 flu by pcr: NOT DETECTED
INFLAPCR: NEGATIVE
Influenza B By PCR: NEGATIVE

## 2016-02-01 LAB — OSMOLALITY, URINE: OSMOLALITY UR: 251 mosm/kg — AB (ref 300–900)

## 2016-02-01 LAB — CBG MONITORING, ED: GLUCOSE-CAPILLARY: 124 mg/dL — AB (ref 65–99)

## 2016-02-01 LAB — SODIUM, URINE, RANDOM: Sodium, Ur: 14 mmol/L

## 2016-02-01 LAB — MRSA PCR SCREENING: MRSA by PCR: NEGATIVE

## 2016-02-01 LAB — CREATININE, URINE, RANDOM: CREATININE, URINE: 52 mg/dL

## 2016-02-01 MED ORDER — LORAZEPAM 2 MG/ML IJ SOLN
1.0000 mg | Freq: Once | INTRAMUSCULAR | Status: AC
Start: 2016-02-01 — End: 2016-02-01
  Administered 2016-02-01: 1 mg via INTRAVENOUS
  Filled 2016-02-01: qty 1

## 2016-02-01 MED ORDER — ACETAMINOPHEN 650 MG RE SUPP: 650.0000 mg | RECTAL | Status: DC | PRN

## 2016-02-01 MED ORDER — ATENOLOL 25 MG PO TABS
25.0000 mg | ORAL_TABLET | Freq: Two times a day (BID) | ORAL | Status: DC
  Filled 2016-02-01: qty 1

## 2016-02-01 MED ORDER — ACETAMINOPHEN 325 MG PO TABS
650.0000 mg | ORAL_TABLET | ORAL | Status: DC | PRN
  Administered 2016-02-02 – 2016-02-03 (×5): 650 mg via ORAL
  Filled 2016-02-01 (×5): qty 2

## 2016-02-01 MED ORDER — ATENOLOL 25 MG PO TABS
12.5000 mg | ORAL_TABLET | Freq: Two times a day (BID) | ORAL | Status: DC
  Administered 2016-02-01 – 2016-02-02 (×3): 12.5 mg via ORAL
  Filled 2016-02-01 (×2): qty 1

## 2016-02-01 MED ORDER — PRAVASTATIN SODIUM 20 MG PO TABS
20.0000 mg | ORAL_TABLET | Freq: Every day | ORAL | Status: DC
  Administered 2016-02-01 – 2016-02-13 (×11): 20 mg via ORAL
  Filled 2016-02-01 (×11): qty 1

## 2016-02-01 MED ORDER — IOHEXOL 350 MG/ML SOLN
80.0000 mL | Freq: Once | INTRAVENOUS | Status: AC | PRN
  Administered 2016-02-01: 80 mL via INTRAVENOUS

## 2016-02-01 MED ORDER — LORAZEPAM 2 MG/ML IJ SOLN
1.0000 mg | Freq: Once | INTRAMUSCULAR | Status: AC
  Administered 2016-02-01: 1 mg via INTRAVENOUS
  Filled 2016-02-01: qty 1

## 2016-02-01 MED ORDER — ALBUTEROL SULFATE (2.5 MG/3ML) 0.083% IN NEBU
2.5000 mg | INHALATION_SOLUTION | Freq: Every day | RESPIRATORY_TRACT | Status: DC
  Administered 2016-02-01 – 2016-02-13 (×12): 2.5 mg via RESPIRATORY_TRACT
  Filled 2016-02-01 (×12): qty 3

## 2016-02-01 MED ORDER — ACETAMINOPHEN 500 MG PO TABS
1000.0000 mg | ORAL_TABLET | Freq: Once | ORAL | Status: AC
  Administered 2016-02-01: 1000 mg via ORAL
  Filled 2016-02-01: qty 2

## 2016-02-01 MED ORDER — PREDNISONE 50 MG PO TABS
50.0000 mg | ORAL_TABLET | Freq: Four times a day (QID) | ORAL | Status: AC
Start: 2016-02-01 — End: 2016-02-01
  Administered 2016-02-01 (×3): 50 mg via ORAL
  Filled 2016-02-01: qty 1
  Filled 2016-02-01: qty 3
  Filled 2016-02-01: qty 1

## 2016-02-01 MED ORDER — SODIUM CHLORIDE 0.9 % IV SOLN
INTRAVENOUS | Status: DC
  Administered 2016-02-01 – 2016-02-02 (×2): via INTRAVENOUS
  Administered 2016-02-03: 1000 mL via INTRAVENOUS

## 2016-02-01 MED ORDER — INSULIN ASPART 100 UNIT/ML ~~LOC~~ SOLN
0.0000 [IU] | Freq: Three times a day (TID) | SUBCUTANEOUS | Status: DC
  Administered 2016-02-01 – 2016-02-02 (×2): 5 [IU] via SUBCUTANEOUS
  Administered 2016-02-02: 3 [IU] via SUBCUTANEOUS
  Administered 2016-02-02: 5 [IU] via SUBCUTANEOUS
  Administered 2016-02-03: 1 [IU] via SUBCUTANEOUS
  Administered 2016-02-03: 2 [IU] via SUBCUTANEOUS
  Administered 2016-02-03: 1 [IU] via SUBCUTANEOUS
  Administered 2016-02-04: 2 [IU] via SUBCUTANEOUS
  Administered 2016-02-04: 1 [IU] via SUBCUTANEOUS
  Administered 2016-02-04: 2 [IU] via SUBCUTANEOUS
  Administered 2016-02-05: 3 [IU] via SUBCUTANEOUS
  Administered 2016-02-05: 1 [IU] via SUBCUTANEOUS
  Administered 2016-02-06: 2 [IU] via SUBCUTANEOUS
  Administered 2016-02-06: 3 [IU] via SUBCUTANEOUS
  Administered 2016-02-07: 1 [IU] via SUBCUTANEOUS
  Administered 2016-02-07: 2 [IU] via SUBCUTANEOUS
  Administered 2016-02-07: 1 [IU] via SUBCUTANEOUS
  Administered 2016-02-08: 2 [IU] via SUBCUTANEOUS
  Administered 2016-02-08 – 2016-02-11 (×8): 1 [IU] via SUBCUTANEOUS
  Administered 2016-02-11: 2 [IU] via SUBCUTANEOUS
  Administered 2016-02-11: 1 [IU] via SUBCUTANEOUS
  Administered 2016-02-12: 2 [IU] via SUBCUTANEOUS
  Administered 2016-02-12 – 2016-02-13 (×3): 1 [IU] via SUBCUTANEOUS
  Administered 2016-02-13: 3 [IU] via SUBCUTANEOUS
  Administered 2016-02-14: 1 [IU] via SUBCUTANEOUS

## 2016-02-01 MED ORDER — PROCHLORPERAZINE EDISYLATE 5 MG/ML IJ SOLN
10.0000 mg | Freq: Once | INTRAMUSCULAR | Status: AC
  Administered 2016-02-01: 10 mg via INTRAVENOUS
  Filled 2016-02-01: qty 2

## 2016-02-01 MED ORDER — CETYLPYRIDINIUM CHLORIDE 0.05 % MT LIQD
7.0000 mL | Freq: Two times a day (BID) | OROMUCOSAL | Status: DC
  Administered 2016-02-01 – 2016-02-07 (×9): 7 mL via OROMUCOSAL

## 2016-02-01 MED ORDER — INSULIN ASPART 100 UNIT/ML ~~LOC~~ SOLN
0.0000 [IU] | SUBCUTANEOUS | Status: DC
  Administered 2016-02-01: 3 [IU] via SUBCUTANEOUS
  Filled 2016-02-01: qty 1

## 2016-02-01 MED ORDER — INSULIN ASPART 100 UNIT/ML ~~LOC~~ SOLN
5.0000 [IU] | Freq: Once | SUBCUTANEOUS | Status: AC
  Administered 2016-02-01: 5 [IU] via SUBCUTANEOUS

## 2016-02-01 MED ORDER — INSULIN ASPART 100 UNIT/ML ~~LOC~~ SOLN
0.0000 [IU] | SUBCUTANEOUS | Status: DC
  Administered 2016-02-01: 1 [IU] via SUBCUTANEOUS

## 2016-02-01 MED ORDER — INSULIN GLARGINE 100 UNIT/ML ~~LOC~~ SOLN
5.0000 [IU] | Freq: Every day | SUBCUTANEOUS | Status: DC
  Administered 2016-02-01 – 2016-02-05 (×5): 5 [IU] via SUBCUTANEOUS
  Filled 2016-02-01 (×8): qty 0.05

## 2016-02-01 MED ORDER — ONDANSETRON HCL 4 MG/2ML IJ SOLN
4.0000 mg | Freq: Once | INTRAMUSCULAR | Status: AC
  Administered 2016-02-01: 4 mg via INTRAVENOUS
  Filled 2016-02-01: qty 2

## 2016-02-01 MED ORDER — STROKE: EARLY STAGES OF RECOVERY BOOK
Freq: Once | Status: AC
  Administered 2016-02-01: 10:00:00
  Filled 2016-02-01: qty 1

## 2016-02-01 MED ORDER — SPIRONOLACTONE 25 MG PO TABS
50.0000 mg | ORAL_TABLET | Freq: Every day | ORAL | Status: DC
  Administered 2016-02-01 – 2016-02-04 (×4): 50 mg via ORAL
  Filled 2016-02-01: qty 2
  Filled 2016-02-01: qty 1
  Filled 2016-02-01: qty 2
  Filled 2016-02-01 (×2): qty 1

## 2016-02-01 MED ORDER — SODIUM CHLORIDE 0.9 % IV BOLUS (SEPSIS)
1000.0000 mL | Freq: Once | INTRAVENOUS | Status: AC
  Administered 2016-02-01: 1000 mL via INTRAVENOUS

## 2016-02-01 MED ORDER — DIPHENHYDRAMINE HCL 25 MG PO CAPS
50.0000 mg | ORAL_CAPSULE | Freq: Once | ORAL | Status: AC
  Administered 2016-02-01: 50 mg via ORAL
  Filled 2016-02-01: qty 2

## 2016-02-01 NOTE — ED Provider Notes (Signed)
CSN: [id number]     Arrival date & time 01/31/16  2156 History  By signing my name below, I, Rowan Blase, attest that this documentation has been prepared under the direction and in the presence of Noemi Chapel, MD . Electronically Signed: Rowan Blase, Scribe. 02/01/2016. 3:04 AM.   Chief Complaint  Patient presents with  . Blurred Vision   The history is provided by the patient. No language interpreter was used.   HPI Comments:  Margaret [name] is a 60 y.o. female with PMHx of HTN, HLD, migraines, and DM who presents to the Emergency Department complaining of sudden onset spotty vision in right eye onset yesterday. Pt reports one episode of "room spinning" dizziness onset 1 week ago lasting 1-2 minutes, better with sitting down; she reports the same dizziness onset at waking yesterday. Pt reports associated low-grade headache since discharge, worsening yesterday; intermittent nausea, worse after episodes of dizziness; cough productive of "foamy" phlegm; and dark, runny diarrhea 2-3 times per day since discharge from hospital. She notes she has been unable to work for more than two days at a time without feeling "yukcy"; she states everything she drinks tastes like iron.  Pt had a splenic infarction 3 weeks ago and was started on Xarelto for a possible clot. Echo on 2-21 was unremarkable. She states she has a mass on her left lung that she is having evaluated soon. Pt had flu shot and other viral vaccinations. Pt denies stiffness in legs, arms, or neck, abdominal pain, chest pain, or dysuria.  Past Medical History  Diagnosis Date  . Hypertension   . Hyperlipidemia   . COPD (chronic obstructive pulmonary disease) ([place]) 04/20/12  . Asthma   . Type II diabetes mellitus ([place])   . Anemia   . H/O hiatal hernia   . Osteoarthritis   . Pneumonia 04/2012  . Migraines   . Panic attacks 04/20/12  . Morbid obesity ([place])   . Heart murmur    Past Surgical History  Procedure Laterality  Date  . Laceration repair  1966    "right upper arm"  . Dilation and curettage of uterus  1990's  . Finger reattachment  1973    "right ring finger"   Family History  Problem Relation Age of Onset  . Allergies    . Hypertension    . Coronary artery disease Mother 68  . Stroke Father   . COPD Father   . Clotting disorder Maternal Grandmother     died from blood clot   Social History  Substance Use Topics  . Smoking status: Former Smoker -- 0.20 packs/day for 40 years    Types: Cigarettes    Quit date: 04/20/2012  . Smokeless tobacco: Never Used     Comment: 6/5//13 "a pack of cigarettes would last me 1 - 1 1/2 wk"  . Alcohol Use: No   OB History    No data available     Review of Systems  Constitutional: Positive for chills.  Eyes: Positive for visual disturbance (blurred vision).  Respiratory: Positive for cough.   Cardiovascular: Negative for chest pain.  Gastrointestinal: Positive for nausea and diarrhea. Negative for abdominal pain.  Genitourinary: Negative for dysuria.  Musculoskeletal: Negative for neck stiffness.  Neurological: Positive for dizziness, light-headedness and headaches.  All other systems reviewed and are negative.  Allergies  Codeine and Latex  Home Medications   Prior to Admission medications   Medication Sig Start Date End Date Taking? Authorizing Provider  albuterol (PROVENTIL  HFA;VENTOLIN HFA) 108 (90 BASE) MCG/ACT inhaler Inhale 2 puffs into the lungs every 4 (four) hours as needed. For shortness of breath. 04/30/12  Yes Shanker Kristeen Mans, MD  albuterol (PROVENTIL) (2.5 MG/3ML) 0.083% nebulizer solution Take 2.5 mg by nebulization at bedtime.    Yes Historical Provider, MD  Alogliptin-Metformin HCl 12.03-999 MG TABS Take 1 tablet by mouth 2 (two) times daily.   Yes Historical Provider, MD  aspirin 81 MG chewable tablet Chew 81 mg by mouth daily.   Yes Historical Provider, MD  atenolol (TENORMIN) 50 MG tablet Take 25 mg by mouth 2 (two) times  daily.    Yes Historical Provider, MD  cholecalciferol (VITAMIN D) 1000 units tablet Take 1,000 Units by mouth 2 (two) times daily.   Yes Historical Provider, MD  furosemide (LASIX) 40 MG tablet Take 40 mg by mouth daily.    Yes Historical Provider, MD  ibuprofen (ADVIL,MOTRIN) 200 MG tablet Take 600 mg by mouth every 6 (six) hours as needed for headache or mild pain.   Yes Historical Provider, MD  insulin NPH-regular (NOVOLIN 70/30) (70-30) 100 UNIT/ML injection Inject 6-16 Units into the skin 2 (two) times daily with a meal.   Yes Historical Provider, MD  loperamide (IMODIUM) 2 MG capsule Take 2 mg by mouth daily as needed for diarrhea or loose stools.   Yes Historical Provider, MD  lovastatin (MEVACOR) 20 MG tablet Take 10 mg by mouth at bedtime.   Yes Historical Provider, MD  naproxen sodium (ANAPROX) 220 MG tablet Take 220 mg by mouth 2 (two) times daily as needed (pain).   Yes Historical Provider, MD  nitroGLYCERIN (NITROSTAT) 0.4 MG SL tablet Place 1 tablet (0.4 mg total) under the tongue every 5 (five) minutes x 3 doses as needed for chest pain. 06/20/12 02/01/16 Yes Jettie Booze, MD  oxyCODONE-acetaminophen (PERCOCET) 10-325 MG tablet Take 1 tablet by mouth 2 (two) times daily as needed for pain.   Yes Historical Provider, MD  Potassium 99 MG TABS Take 99 mg by mouth every morning.   Yes Historical Provider, MD  Rivaroxaban (XARELTO STARTER PACK) 15 & 20 MG TBPK Take as directed on package: Start with one 15mg  tablet by mouth twice a day with food. On Day 22, switch to one 20mg  tablet once a day with food. 01/16/16  Yes Maryann Mikhail, DO  spironolactone (ALDACTONE) 25 MG tablet Take 50 mg by mouth daily.    Yes Historical Provider, MD  ciprofloxacin (CIPRO) 500 MG tablet Take 1 tablet (500 mg total) by mouth 2 (two) times daily. Patient not taking: Reported on 02/01/2016 01/16/16   Velta Addison Mikhail, DO  metroNIDAZOLE (FLAGYL) 500 MG tablet Take 1 tablet (500 mg total) by mouth every 8  (eight) hours. Patient not taking: Reported on 02/01/2016 01/16/16   Maryann Mikhail, DO   BP 101/52 mmHg  Pulse 80  Temp(Src) 100.7 F (38.2 C) (Oral)  Resp 13  SpO2 87% Physical Exam  Constitutional: She appears well-developed and well-nourished. No distress.  HENT:  Head: Normocephalic and atraumatic.  Mouth/Throat: Oropharynx is clear and moist. No oropharyngeal exudate.  Eyes: Conjunctivae are normal. Pupils are equal, round, and reactive to light. Right eye exhibits no discharge. Left eye exhibits no discharge. No scleral icterus.  Small window mid vision of central vision loss in right eye, not peripheral; Puplilary exam is normal; no papal edema BL  Neck: Normal range of motion. Neck supple. No JVD present. No thyromegaly present.  Very supple; no tenderness;  no stiffness; no LAD  Cardiovascular: Normal rate, regular rhythm and intact distal pulses.  Exam reveals no gallop and no friction rub.   Murmur heard. Soft systolic heart murmur  Pulmonary/Chest: Effort normal and breath sounds normal. No respiratory distress. She has no wheezes. She has no rales.  Abdominal: Soft. Bowel sounds are normal. She exhibits no distension and no mass. There is no tenderness.  Musculoskeletal: Normal range of motion. She exhibits no edema or tenderness.  Lymphadenopathy:    She has no cervical adenopathy.  Neurological: She is alert. Coordination normal.  Normal strength and sensation, normal cranial nerves III through XII, normal coordination by finger-nose-finger Right eye central vision loss; clear peripheral vision; left eye normal vision; pupils normal  Skin: Skin is warm and dry. No rash noted. No erythema.  Psychiatric: She has a normal mood and affect. Her behavior is normal.  Nursing note and vitals reviewed.  ED Course  Procedures  DIAGNOSTIC STUDIES:  Oxygen Saturation is 100% on RA, normal by my interpretation.    COORDINATION OF CARE:  12:55 AM Will order chest x-ray and  head CT. Discussed treatment plan with pt at bedside and pt agreed to plan.  3:10 AM Consult to pharmacy.  3:20 AM Dr. Roselee Culver called back. Recommends against reversal of Xarelto and agrees with pursing LR.   3:50 AM Dr. Darius Bump will provide consult. Recommended hospitalist admit.  Labs Review Labs Reviewed  PROTIME-INR - Abnormal; Notable for the following:    Prothrombin Time 23.9 (*)    INR 2.16 (*)    All other components within normal limits  CBC - Abnormal; Notable for the following:    WBC 17.9 (*)    Platelets 478 (*)    All other components within normal limits  DIFFERENTIAL - Abnormal; Notable for the following:    Neutro Abs 15.2 (*)    All other components within normal limits  COMPREHENSIVE METABOLIC PANEL - Abnormal; Notable for the following:    Sodium 131 (*)    Chloride 94 (*)    Glucose, Bld 215 (*)    Creatinine, Ser 1.13 (*)    Albumin 3.1 (*)    ALT 12 (*)    GFR calc non Af Amer 52 (*)    All other components within normal limits  URINALYSIS, ROUTINE W REFLEX MICROSCOPIC (NOT AT [facility]) - Abnormal; Notable for the following:    Hgb urine dipstick LARGE (*)    All other components within normal limits  URINE MICROSCOPIC-ADD ON - Abnormal; Notable for the following:    Squamous Epithelial / LPF 6-30 (*)    Bacteria, UA FEW (*)    All other components within normal limits  I-STAT CHEM 8, ED - Abnormal; Notable for the following:    Sodium 132 (*)    Chloride 94 (*)    Creatinine, Ser 1.10 (*)    Glucose, Bld 211 (*)    Calcium, Ion 1.28 (*)    All other components within normal limits  APTT  INFLUENZA PANEL BY PCR (TYPE A & B, H1N1)  I-STAT TROPOININ, ED    Imaging Review Dg Chest 2 View  02/01/2016  CLINICAL DATA:  Cough EXAM: CHEST  2 VIEW COMPARISON:  01/14/2016 FINDINGS: Chronic cardiomegaly with pulmonary venous congestion. Negative aortic contours. There is no edema, consolidation, effusion, or pneumothorax. IMPRESSION: Cardiomegaly and  pulmonary venous congestion. Electronically Signed   By: Monte Fantasia M.D.   On: 02/01/2016 02:49   Ct Head Wo  Contrast  02/01/2016  CLINICAL DATA:  Headache and right eye blurred vision since beginning Xarelto. EXAM: CT HEAD WITHOUT CONTRAST TECHNIQUE: Contiguous axial images were obtained from the base of the skull through the vertex without intravenous contrast. COMPARISON:  10/09/2013 FINDINGS: Skull and Sinuses:Negative for fracture or destructive process. The visualized mastoids, middle ears, and imaged paranasal sinuses are clear. Visualized orbits: Negative. Brain: There is unexpected subarachnoid high-density effacement in the left parietal occipital region, newly seen. This is likely fading subarachnoid hemorrhage. Reversible cerebral vasoconstrictive syndrome, posterior reversible encephalopathy syndrome (which would have to be occult by CT), or anti coagulation complication are primary considerations. No cortical or subcortical low-density. No evidence of acute infarct. No hydrocephalus or shift. These results were called by telephone at the time of interpretation on 02/01/2016 at 2:44 am to Dr. Noemi Chapel , who verbally acknowledged these results. IMPRESSION: Localized subarachnoid hemorrhage/debris in the left parietal occipital region as discussed above. MRI/MRA recommended. Electronically Signed   By: Monte Fantasia M.D.   On: 02/01/2016 02:48   I have personally reviewed and evaluated these images and lab results as part of my medical decision-making.   EKG Interpretation   Date/Time:  Friday January 31 2016 22:23:04 EST Ventricular Rate:  65 PR Interval:  154 QRS Duration: 86 QT Interval:  420 QTC Calculation: 436 R Axis:   64 Text Interpretation:  Normal sinus rhythm Normal ECG since last tracing no  significant change Confirmed by Yogi Arther  MD, Brittnae Aschenbrenner ([id number]) on 02/01/2016  12:29:42 AM      MDM   Final diagnoses:  SAH (subarachnoid hemorrhage) ([place])    I personally  performed the services described in this documentation, which was scribed in my presence. The recorded information has been reviewed and is accurate.   CRITICAL CARE Performed by: Johnna Acosta Total critical care time: 35 minutes Critical care time was exclusive of separately billable procedures and treating other patients. Critical care was necessary to treat or prevent imminent or life-threatening deterioration. Critical care was time spent personally by me on the following activities: development of treatment plan with patient and/or surrogate as well as nursing, discussions with consultants, evaluation of patient's response to treatment, examination of patient, obtaining history from patient or surrogate, ordering and performing treatments and interventions, ordering and review of laboratory studies, ordering and review of radiographic studies, pulse oximetry and re-evaluation of patient's condition.   Care was discussed with multiple providers including the neurosurgeon, the neuro hospitalist as well as the hospitalist, the hospitalist will admit, Dr. Leonel Ramsay will provide consultation, the patient will be admitted to the hospital.  MRI pending at time of admission.   Noemi Chapel, MD 02/01/16 [phone]

## 2016-02-01 NOTE — Progress Notes (Signed)
STROKE TEAM PROGRESS NOTE   HISTORY OF PRESENT ILLNESS Margaret [name] is a 60 y.o. female who presents with headache/vision changes that started today. She had headaches starting Thursday which worsened over the course of the day. Today, she had sudden onset of vision loss with headache and lightheadedness. She has also had some nausea and vomiting.  Of note, she was just discharged from the hospital after an acute splenic infarction without clear etiology or source of embolus. She was on aspirin and Xarelto, aspirin was discontinued at her March 2 clinic visit. At that time, Dr. Alvy Bimler felt that it was most likely related to splenic congestion.   Here, she has low-grade fevers.  LKW: Thursday, 03/09 tpa given?: no, out of window, anticoagulated.    SUBJECTIVE (INTERVAL HISTORY) Her RN is at the bedside.  Overall she feels her condition is gradually improving. She stated that she has been taking Xarelto after discharge. One week ago, when she was standing in the kitchen, suddenly she had vertigo, loss of balance, has to be assisted to lie in bed, vertigo gradually resolved. Yesterday, she sat up in the bed after waking up, suddenly started to have room spinning lasting 2-3 minutes, at the same time she had right eye central gray vision and left eye central vision spinning light, lasted until now. She denies any double vision, feels nauseous but no vomiting. Had mild headache since discharge, no change of headache yesterday. She does have migraine history but she has not had typical migraine for the last 3 to 4 years.  In February 2017 patient was admitted for acute splenic infarction at that time LE venous Doppler showed no evidence of DVT. 2-D echo showed EF of 60-65% and no cardiac source of emboli. Patient was started on heparin drip and transitioned to xarelto with plan to obtain outpatient TEE. 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 The plan was for her to remain on anticoagulation for next 6 months.   On admission yesterday, febrile 100.7 and WBC was noted to be 17.9. CXR cardiomegaly and pulmonary venous congestion. CT head showed left parieto-occipital small SAH, MRI confirmed the [facility] but also showed at least 4 punctate acute infarcts at watershed area including left MCA/PCA, bilaterally MCA/ACA.   OBJECTIVE Temp:  [97.9 F (36.6 C)-100.7 F (38.2 C)] 97.9 F (36.6 C) (03/11 1600) Pulse Rate:  [58-80] 68 (03/11 1600) Cardiac Rhythm:  [-] Normal sinus rhythm (03/11 1225) Resp:  [13-29] 17 (03/11 1600) BP: (101-123)/(47-89) 117/89 mmHg (03/11 1600) SpO2:  [87 %-100 %] 95 % (03/11 1600) Weight:  [276 lb 7.3 oz (125.4 kg)] 276 lb 7.3 oz (125.4 kg) (03/11 1200)  CBC:   Recent Labs Lab 01/31/16 2232 01/31/16 2233  WBC 17.9*  --   NEUTROABS 15.2*  --   HGB 13.0 14.6  HCT 40.9 43.0  MCV 87.8  --   PLT 478*  --     Basic Metabolic Panel:   Recent Labs Lab 01/31/16 2232 01/31/16 2233  NA 131* 132*  K 4.9 4.6  CL 94* 94*  CO2 26  --   GLUCOSE 215* 211*  BUN 13 15  CREATININE 1.13* 1.10*  CALCIUM 10.2  --     Lipid Panel:     Component Value Date/Time   CHOL 228* 06/16/2012 0614   TRIG 127 06/16/2012 [id number]  HDL 47 06/16/2012 0614   CHOLHDL 4.9 06/16/2012 0614   VLDL 25 06/16/2012 0614   LDLCALC 156* 06/16/2012 0614   HgbA1c:  Lab Results  Component Value Date   HGBA1C 9.1* 01/14/2016   Urine Drug Screen: No results found for: LABOPIA, COCAINSCRNUR, LABBENZ, AMPHETMU, THCU, LABBARB    IMAGING I have personally reviewed the radiological images below and agree with the radiology interpretations.  Dg Chest 2 View 02/01/2016    Cardiomegaly and pulmonary venous congestion.   Ct Head Wo Contrast 02/01/2016   Localized subarachnoid hemorrhage/debris in the left parietal occipital region as  discussed above. MRI/MRA recommended.   Mri and Mra Head Wo Contrast 02/01/2016   1. Small volume subarachnoid hemorrhage/debris within the left parieto-occipital region, corresponding to abnormality seen on prior CT. Minimal gyral swelling within this region without significant mass effect or other abnormality.  2. Few sub cm cortical/subcortical ischemic infarcts within the bilateral parietal lobes as above.  3. Negative intracranial MRA.   CTA of head and neck 02/01/2016 1. Mild intracranial and extracranial atherosclerosis without significant stenosis. 2. Patent dural venous sinuses. No vascular malformation identified.  LE venous doppler - pending  EEG - pending   PHYSICAL EXAM  Temp:  [97.9 F (36.6 C)-100.7 F (38.2 C)] 97.9 F (36.6 C) (03/11 1600) Pulse Rate:  [58-80] 68 (03/11 1600) Resp:  [13-29] 17 (03/11 1600) BP: (101-123)/(47-89) 117/89 mmHg (03/11 1600) SpO2:  [87 %-100 %] 95 % (03/11 1600) Weight:  [276 lb 7.3 oz (125.4 kg)] 276 lb 7.3 oz (125.4 kg) (03/11 1200)  General - morbid obesity, well developed, in no apparent distress.  Ophthalmologic - Fundi not visualized due to small pupils.  Cardiovascular - Regular rate and rhythm.  Mental Status -  Level of arousal and orientation to time, place, and person were intact. Language including expression, naming, repetition, comprehension was assessed and found intact. Fund of Knowledge was assessed and was intact.  Cranial Nerves II - XII - II - Visual field intact OU. III, IV, VI - Extraocular movements intact. V - Facial sensation intact bilaterally. VII - Facial movement intact bilaterally. VIII - Hearing & vestibular intact bilaterally. X - Palate elevates symmetrically. XI - Chin turning & shoulder shrug intact bilaterally. XII - Tongue protrusion intact.  Motor Strength - [name] strength was normal in all extremities and pronator drift was absent.  Bulk was normal and fasciculations were  absent.   Motor Tone - Muscle tone was assessed at the neck and appendages and was normal.  Reflexes - [name] reflexes were 1+ in all extremities and she had no pathological reflexes.  Sensory - Light touch, temperature/pinprick were assessed and were symmetrical.    Coordination - The patient had normal movements in the hands and feet with no ataxia or dysmetria.  Tremor was absent.  Gait and Station - deferred due to safety concerns.   ASSESSMENT/PLAN Margaret [name] is a 60 y.o. female with history of hypertension, hyperlipidemia, COPD, asthma, diabetes mellitus, migraines, panic attacks, and morbid obesity, presenting with mild headache, visual changes, low-grade fevers, and nausea. She did not receive IV t-PA due to late presentation and anticoagulation.  Small SAH within the left parieto-occipital region, mainly related to Xarelto use. Ruled out cerebral venous thrombosis by CTV.  Resultant  Seems asymptomatic  MRI - left parieto-occipital small SAH, punctate infarcts at the watershed area (left MCA/PCA, b/l MCA/ACA)  MRA - negative  CTA head and neck - no cerebral venous  thrombosis, AVM, or aneurysm.  2D Echo - EF 60-65% in 12/2015  LE venous doppler - pending  EEG - pending  LDL - pending  HgbA1c pending  VTE prophylaxis - SCDs Diet Carb Modified Fluid consistency:: Thin; Room service appropriate?: Yes  Xarelto (rivaroxaban) daily prior to admission, now on No antithrombotic secondary to [facility].  Patient counseled to be compliant with her antithrombotic medications  Ongoing aggressive stroke risk factor management  Therapy recommendations: Pending  Disposition: Pending  Stroke: At least 4 punctate infarct at the watershed area including left MCA/PCA, bilateral MCA/ACA, etiology unclear. Could be due to hypotension vs. Endocarditis (low grade fever, recent spleen infarct, elevated WBC) vs. afib (was told to have irregular heart beat 20 years ago but  not sure if afib) vs. Hypercoagulable state  Noted that there is plan for outpt TEE  Agree with TEE if this is still the plan at this time  May consider loop recorder, not able to do 30 day cardiac monitoring as pt has no insurance   Hypercoagulable panel sent  Avoid low BP  ? Migraine visual aura  Patient visual changes cannot be explained by [facility]  More consistent with migraine visual aura, however no significant migraine this time  Patient does have history of migraine  ? 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   On Xarelto for 6 months, but on hold now due to [facility]  Discuss with primary team to find out the plan for anticoagulation long term  Hypertension  Stable mildly low blood pressures.  Avoid hypotension  Hyperlipidemia  Home meds:  Lovastatin 20 mg daily resumed in hospital  LDL pending, goal < 70  Continue statin at discharge  Diabetes  HgbA1c pending, goal < 7.0  Uncontrolled  On lantus  SSI  Other Stroke Risk Factors  Advanced age  Cigarette smoker, quit smoking 3 years ago.  Morbid obesity, Body mass index is 46 kg/(m^2).   Hx stroke/TIA  Family hx stroke (father)  Migraines  PVD - aortic athrosclerosis  Other Active Problems  Leukocytosis  Elevated creatinine 1.10  Mild hyponatremia  Pulmonary HTN  Hilar lymphoadenopathy   Hospital day # 0  Rosalin Hawking, MD PhD Stroke Neurology 02/01/2016 4:56 PM     To contact Stroke Continuity provider, please refer to [email or web address] After hours, contact General Neurology

## 2016-02-01 NOTE — ED Notes (Signed)
Contacted pharmacy regarding atenolol dosage. Pt. sts she only takes 12.5 mg atenolol in morning and at night. 25 mg ordered for twice a day. Pharmacy sts they will change the order. Medication held until then.

## 2016-02-01 NOTE — ED Notes (Signed)
Carb modified tray ordered for patient at this time.

## 2016-02-01 NOTE — Plan of Care (Signed)
Problem: Self-Care: Goal: Ability to participate in self-care as condition permits will improve Outcome: Progressing Patient able to participate with ADLs such as toileting with minimal assistance.

## 2016-02-01 NOTE — ED Notes (Signed)
Patient transported to MRI 

## 2016-02-01 NOTE — Progress Notes (Signed)
Patient seen and evaluated earlier this am by my associate. Please refer to H and P for details regarding assessment and plan.  Pt found to have small volume subarachnoid hemorrhage within the left parieto-occipital region.   Neurology on board and patient currently undergoing work up. Anticoagulants held  Gen: pt in nad, alert and awake CV: s1 and s2 wnl, no rubs Pulm: No increased wob, no wheezes, equal chest rise  Will reassess next am.  Velvet Bathe

## 2016-02-01 NOTE — Consult Note (Addendum)
Neurology Consultation Reason for Consult: abnormal CT  Referring Physician: Sabra Heck, B  CC: Vision changes.   History is obtained from: husband. History is limited as patient received ativan for MRI.   HPI: Margaret [name] is a 60 y.o. female who presents with headache/vision changes that started today. She had headaches starting Thursday which worsened over the course of the day. Today, she had sudden onset of vision loss with headache and lightheadedness. She has also had some nausea and vomiting.  Of note, she was just discharged from the hospital after an acute splenic infarction without clear etiology or source of embolus. She was on aspirin and Xarelto, aspirin was discontinued at her March 2 clinic visit. At that time, Dr. Alvy Bimler felt that it was most likely related to splenic congestion.   Here, she has low-grade fevers.  LKW: Thursday, 03/09 tpa given?: no, out of window, anticoagulated.     ROS: Unable to obtain due to altered mental status.   Past Medical History  Diagnosis Date  . Hypertension   . Hyperlipidemia   . COPD (chronic obstructive pulmonary disease) ([place]) 04/20/12  . Asthma   . Type II diabetes mellitus ([place])   . Anemia   . H/O hiatal hernia   . Osteoarthritis   . Pneumonia 04/2012  . Migraines   . Panic attacks 04/20/12  . Morbid obesity ([place])   . Heart murmur      Family History  Problem Relation Age of Onset  . Allergies    . Hypertension    . Coronary artery disease Mother 92  . Stroke Father   . COPD Father   . Clotting disorder Maternal Grandmother     died from blood clot     Social History:  reports that she quit smoking about 3 years ago. Her smoking use included Cigarettes. She has a 8 pack-year smoking history. She has never used smokeless tobacco. She reports that she does not drink alcohol or use illicit drugs.   Exam: Current vital signs: BP 101/52 mmHg  Pulse 80  Temp(Src) 100.7 F (38.2 C) (Oral)  Resp 13  SpO2  87% Vital signs in last 24 hours: Temp:  [100.6 F (38.1 C)-100.7 F (38.2 C)] 100.7 F (38.2 C) (03/11 0018) Pulse Rate:  [66-80] 80 (03/11 0300) Resp:  [13-18] 13 (03/11 0130) BP: (101-121)/(52-63) 101/52 mmHg (03/11 0300) SpO2:  [87 %-100 %] 87 % (03/11 0300)   Physical Exam  Constitutional: Appears well-developed and well-nourished.  Psych: Appears quite sedated Eyes: No scleral injection HENT: No OP obstrucion  Head: Normocephalic.  Cardiovascular: Normal rate and regular rhythm.  Respiratory: Effort normal  GI: Soft.  No distension. There is no tenderness.  Skin: WDI   Please note that the exam is markedly limited by Ativan Neuro: Mental Status: Patient is  obtunded, does not remain awake for any significant length time but I am able to get her to open her eyes and follow commands in all 4 extremities. Cranial Nerves: II:  she does count fingers in the right field at least once, but her answers are inconsistent and it is difficult to get her to reliably answer questions regarding visual fields.  Pupils are equal, round, and reactive to light.   III,IV, VI: EOMI without ptosis or diploplia.  V:  endorses sensation bilaterally  VII: Facial movement is symmetric.  VIII, X, XI, XII: Unable to assess secondary to patient's altered mental status.  Motor: She moves all extremities to command.  Sensory:  she endorses sensation bilaterally, it is unclear to me if she has asymmetric sensation or not. Cerebellar: she does not have any clear ataxia, but does not cooperate with formal finger-nose-finger    I have reviewed labs in epic and the results pertinent to this consultation are:  elevated INR   I have reviewed the images obtained: CT head- edema with hypodensity seen in the left parietal region. I do wonder if this represents infarct with petechial hemorrhage.   Impression:60 year old female with recent splenic infarction now presenting with abnormal head CT. Management  of her subarachnoid hemorrhage was discussed with neurosurgery, who recommended conservative management with medical admission. I wonder if it is actually subarachnoid versus petechial hemorrhage associated with subacute infarct. If this does represent subarachnoid hemorrhage, it appears that it is a very small  Amount.  If the MRI is concerning for possible cerebritis, then I would start empiric coverage with antibiotics. I feel that this would have less chance of harm than doing the LP with Xarelto onboard.   Her obtundation appears to be iatrogenic from Compazine and Ativan given the husband's history that she was not obtunded prior to this.  Recommendations: 1) MRI brain/MRA head 2) further recommendations pending above imaging    Roland Rack, MD [facility] [phone]  If 7pm- 7am, please page neurology on call as listed in [place].

## 2016-02-01 NOTE — ED Notes (Signed)
Attempted report 

## 2016-02-01 NOTE — H&P (Signed)
PCP:  Harvie Junior, MD [place] Referring provider Sabra Heck   Chief Complaint:  Headache  HPI: Margaret [name] is a 60 y.o. female   has a past medical history of Hypertension; Hyperlipidemia; COPD (chronic obstructive pulmonary disease) ([place]) (04/20/12); Asthma; Type II diabetes mellitus ([place]); Anemia; H/O hiatal hernia; Osteoarthritis; Pneumonia (04/2012); Migraines; Panic attacks (04/20/12); Morbid obesity ([place]); and Heart murmur.   Presented with blurred vision as well as central visual loss associated with headache and lightheadedness. She have had mild headaches before but today had a severe headache.  Lightheadedness is worse standing up. This was associated some nausea but no vomiting she has been having some diarrhea ever since her discharge. She had an episode of room spinning sensation which happened about a week ago was very brief but  she had a repeat episode again yesterday 3/10.    she reports some chills and then diaphoresis as per family.   IN ER: Febrile 100.7 CT scan of the head was done which showed localized subarachnoid hemorrhage and left parieto-occipital region. WBC was  noted to be 17.9 chest x-ray showed cardiomegaly and pulmonary venous congestion. Sodium 131 which is close to patient's baseline at the time of discharge patient sodium as well as 133. She has given Compazine and Zofran for nausea as well as 1 L normal saline bolus Neurology has been consulted  Regarding pertinent past history: In February 2017 patient was admitted for acute splenic infarction at that time lower extremity Doppler showed no evidence of DVT echocardiogram showed EF of 60-65 percent and grade 2 diastolic dysfunction and no cardiac source of emboli. Patient was started on heparin drip and transitioned to xarelto with plan to obtain outpatient TEE. 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 Hepatitis panel was unremarkable. Patient has history of diabetes mellitus last hemoglobin A1c was 9.1   Hospitalist was called for admission for subarachnoid bleeding with possible CVA  Review of Systems:    Pertinent positives include:  Headaches, chills, fatigue, vertigo, nausea, vomiting, diarrhea,   Constitutional:  No weight loss, night sweats, Fevers,  weight loss  HEENT:  No Difficulty swallowing,Tooth/dental problems,Sore throat,  No sneezing, itching, ear ache, nasal congestion, post nasal drip,  Cardio-vascular:  No chest pain, Orthopnea, PND, anasarca, dizziness, palpitations.no Bilateral lower extremity swelling  GI:  No heartburn, indigestion, abdominal pain, change in bowel habits, loss of appetite, melena, blood in stool, hematemesis Resp:  no shortness of breath at rest. No dyspnea on exertion, No excess mucus, no productive cough, No non-productive cough, No coughing up of blood.No change in color of mucus.No wheezing. Skin:  no rash or lesions. No jaundice GU:  no dysuria, change in color of urine, no urgency or frequency. No straining to urinate.  No flank pain.  Musculoskeletal:  No joint pain or no joint swelling. No decreased range of motion. No back pain.  Psych:  No change in mood or affect. No depression or anxiety. No memory loss.  Neuro: no localizing neurological complaints, no tingling, no weakness, no double vision, no gait abnormality, no slurred speech, no confusion  Otherwise ROS are negative except for above, 10 systems were reviewed  Past Medical History: Past Medical History  Diagnosis Date  . Hypertension   .  Hyperlipidemia   . COPD (chronic obstructive pulmonary disease) ([place]) 04/20/12  . Asthma   . Type II diabetes mellitus ([place])   . Anemia   . H/O hiatal hernia   . Osteoarthritis   . Pneumonia 04/2012  . Migraines   . Panic attacks 04/20/12  . Morbid obesity ([place])   . Heart murmur    Past Surgical History  Procedure Laterality Date  . Laceration repair  1966    "right upper arm"  . Dilation and curettage of uterus  1990's  . Finger reattachment  1973    "right ring finger"     Medications: Prior to Admission medications   Medication Sig Start Date End Date Taking? Authorizing Provider  albuterol (PROVENTIL HFA;VENTOLIN HFA) 108 (90 BASE) MCG/ACT inhaler Inhale 2 puffs into the lungs every 4 (four) hours as needed. For shortness of breath. 04/30/12  Yes Shanker Kristeen Mans, MD  albuterol (PROVENTIL) (2.5 MG/3ML) 0.083% nebulizer solution Take 2.5 mg by nebulization at bedtime.    Yes Historical Provider, MD  Alogliptin-Metformin HCl 12.03-999 MG TABS Take 1 tablet by mouth 2 (two) times daily.   Yes Historical Provider, MD  aspirin 81 MG chewable tablet Chew 81 mg by mouth daily.   Yes Historical Provider, MD  atenolol (TENORMIN) 50 MG tablet Take 25 mg by mouth 2 (two) times daily.    Yes Historical Provider, MD  cholecalciferol (VITAMIN D) 1000 units tablet Take 1,000 Units by mouth 2 (two) times daily.   Yes Historical Provider, MD  furosemide (LASIX) 40 MG tablet Take 40 mg by mouth daily.    Yes Historical Provider, MD  ibuprofen (ADVIL,MOTRIN) 200 MG tablet Take 600 mg by mouth every 6 (six) hours as needed for headache or mild pain.   Yes Historical Provider, MD  insulin NPH-regular (NOVOLIN 70/30) (70-30) 100 UNIT/ML injection Inject 6-16 Units into the skin 2 (two) times daily with a meal.   Yes Historical Provider, MD  loperamide (IMODIUM) 2 MG capsule Take 2 mg by mouth daily as needed for diarrhea or loose stools.   Yes Historical Provider, MD  lovastatin (MEVACOR) 20 MG tablet  Take 10 mg by mouth at bedtime.   Yes Historical Provider, MD  naproxen sodium (ANAPROX) 220 MG tablet Take 220 mg by mouth 2 (two) times daily as needed (pain).   Yes Historical Provider, MD  nitroGLYCERIN (NITROSTAT) 0.4 MG SL tablet Place 1 tablet (0.4 mg total) under the tongue every 5 (five) minutes x 3 doses as needed for chest pain. 06/20/12 02/01/16 Yes Jettie Booze, MD  oxyCODONE-acetaminophen (PERCOCET) 10-325 MG tablet Take 1 tablet by mouth 2 (two) times daily as needed for pain.   Yes Historical Provider, MD  Potassium 99 MG TABS Take 99 mg by mouth every morning.   Yes Historical Provider, MD  Rivaroxaban (XARELTO STARTER PACK) 15 & 20 MG TBPK Take as directed on package: Start with one 15mg  tablet by mouth twice a day with food. On Day 22, switch to one 20mg  tablet once a day with food. 01/16/16  Yes Maryann Mikhail, DO  spironolactone (ALDACTONE) 25 MG tablet Take 50 mg by mouth daily.    Yes Historical Provider, MD  ciprofloxacin (CIPRO) 500 MG tablet Take 1 tablet (500 mg total) by mouth 2 (two) times daily. Patient not taking: Reported on  02/01/2016 01/16/16   Maryann Mikhail, DO  metroNIDAZOLE (FLAGYL) 500 MG tablet Take 1 tablet (500 mg total) by mouth every 8 (eight) hours. Patient not taking: Reported on 02/01/2016 01/16/16   Cristal Ford, DO    Allergies:   Allergies  Allergen Reactions  . Codeine Hives, Itching and Other (See Comments)    "breathing problems"  . Latex Itching    Social History:  Ambulatory independently Lives at home  With family    reports that she quit smoking about 3 years ago. Her smoking use included Cigarettes. She has a 8 pack-year smoking history. She has never used smokeless tobacco. She reports that she does not drink alcohol or use illicit drugs.    Family History: family history includes COPD in her father; Clotting disorder in her maternal grandmother; Coronary artery disease (age of onset: 25) in her mother; Stroke in her  father.    Physical Exam: Patient Vitals for the past 24 hrs:  BP Temp Temp src Pulse Resp SpO2  02/01/16 0300 (!) 101/52 mmHg - - 80 - (!) 87 %  02/01/16 0130 121/63 mmHg - - 67 13 93 %  02/01/16 0018 (!) 106/53 mmHg 100.7 F (38.2 C) Oral 73 18 100 %  01/31/16 2215 119/63 mmHg 100.6 F (38.1 C) Oral 66 18 95 %    1. General:  in No Acute distress 2. Psychological: somnolent  Oriented to self 3. Head/ENT:   Moist Mucous Membranes                          Head Non traumatic, neck supple                          Normal Dentition 4. SKIN: normal Skin turgor,  Skin clean Dry and intact no rash 5. Heart: Regular rate and rhythm mild systolic Murmur, no Rub or gallop 6. Lungs: no wheezes or crackles  distant 7. Abdomen: Soft, non-tender, Non distended obese 8. Lower extremities: no clubbing, cyanosis, or edema 9. Neurologically strength 5 out of 5 in all 4 extremities cranial nerves II through XII intact 10. MSK: Normal range of motion  body mass index is unknown because there is no weight on file.   Labs on Admission:   Results for orders placed or performed during the hospital encounter of 02/01/16 (from the past 24 hour(s))  I-stat troponin, ED (not at [facility], [facility])     Status: None   Collection Time: 01/31/16 10:31 PM  Result Value Ref Range   Troponin i, poc 0.01 0.00 - 0.08 ng/mL   Comment 3          Protime-INR     Status: Abnormal   Collection Time: 01/31/16 10:32 PM  Result Value Ref Range   Prothrombin Time 23.9 (H) 11.6 - 15.2 seconds   INR 2.16 (H) 0.00 - 1.49  APTT     Status: None   Collection Time: 01/31/16 10:32 PM  Result Value Ref Range   aPTT 36 24 - 37 seconds  CBC     Status: Abnormal   Collection Time: 01/31/16 10:32 PM  Result Value Ref Range   WBC 17.9 (H) 4.0 - 10.5 K/uL   RBC 4.66 3.87 - 5.11 MIL/uL   Hemoglobin 13.0 12.0 - 15.0 g/dL   HCT 40.9 36.0 - 46.0 %   MCV 87.8 78.0 - 100.0 fL   MCH 27.9 26.0 - 34.0 pg  MCHC 31.8 30.0 - 36.0 g/dL    RDW 15.0 11.5 - 15.5 %   Platelets 478 (H) 150 - 400 K/uL  Differential     Status: Abnormal   Collection Time: 01/31/16 10:32 PM  Result Value Ref Range   Neutrophils Relative % 84 %   Neutro Abs 15.2 (H) 1.7 - 7.7 K/uL   Lymphocytes Relative 10 %   Lymphs Abs 1.7 0.7 - 4.0 K/uL   Monocytes Relative 5 %   Monocytes Absolute 0.9 0.1 - 1.0 K/uL   Eosinophils Relative 1 %   Eosinophils Absolute 0.1 0.0 - 0.7 K/uL   Basophils Relative 0 %   Basophils Absolute 0.0 0.0 - 0.1 K/uL  Comprehensive metabolic panel     Status: Abnormal   Collection Time: 01/31/16 10:32 PM  Result Value Ref Range   Sodium 131 (L) 135 - 145 mmol/L   Potassium 4.9 3.5 - 5.1 mmol/L   Chloride 94 (L) 101 - 111 mmol/L   CO2 26 22 - 32 mmol/L   Glucose, Bld 215 (H) 65 - 99 mg/dL   BUN 13 6 - 20 mg/dL   Creatinine, Ser 1.13 (H) 0.44 - 1.00 mg/dL   Calcium 10.2 8.9 - 10.3 mg/dL   Total Protein 7.2 6.5 - 8.1 g/dL   Albumin 3.1 (L) 3.5 - 5.0 g/dL   AST 18 15 - 41 U/L   ALT 12 (L) 14 - 54 U/L   Alkaline Phosphatase 75 38 - 126 U/L   Total Bilirubin 0.5 0.3 - 1.2 mg/dL   GFR calc non Af Amer 52 (L) >60 mL/min   GFR calc Af Amer >60 >60 mL/min   Anion gap 11 5 - 15  I-Stat Chem 8, ED  (not at [facility], [facility])     Status: Abnormal   Collection Time: 01/31/16 10:33 PM  Result Value Ref Range   Sodium 132 (L) 135 - 145 mmol/L   Potassium 4.6 3.5 - 5.1 mmol/L   Chloride 94 (L) 101 - 111 mmol/L   BUN 15 6 - 20 mg/dL   Creatinine, Ser 1.10 (H) 0.44 - 1.00 mg/dL   Glucose, Bld 211 (H) 65 - 99 mg/dL   Calcium, Ion 1.28 (H) 1.12 - 1.23 mmol/L   TCO2 25 0 - 100 mmol/L   Hemoglobin 14.6 12.0 - 15.0 g/dL   HCT 43.0 36.0 - 46.0 %  Urinalysis, Routine w reflex microscopic (not at [facility])     Status: Abnormal   Collection Time: 02/01/16  1:20 AM  Result Value Ref Range   Color, Urine YELLOW YELLOW   APPearance CLEAR CLEAR   Specific Gravity, Urine 1.014 1.005 - 1.030   pH 5.0 5.0 - 8.0   Glucose, UA NEGATIVE NEGATIVE mg/dL     Hgb urine dipstick LARGE (A) NEGATIVE   Bilirubin Urine NEGATIVE NEGATIVE   Ketones, ur NEGATIVE NEGATIVE mg/dL   Protein, ur NEGATIVE NEGATIVE mg/dL   Nitrite NEGATIVE NEGATIVE   Leukocytes, UA NEGATIVE NEGATIVE  Urine microscopic-add on     Status: Abnormal   Collection Time: 02/01/16  1:20 AM  Result Value Ref Range   Squamous Epithelial / LPF 6-30 (A) NONE SEEN   WBC, UA 0-5 0 - 5 WBC/hpf   RBC / HPF 6-30 0 - 5 RBC/hpf   Bacteria, UA FEW (A) NONE SEEN   Urine-Other MUCOUS PRESENT     UA  no evidence of UTI  Lab Results  Component Value Date   HGBA1C 9.1* 01/14/2016  Estimated Creatinine Clearance: 74.1 mL/min (by C-G formula based on Cr of 1.1).  BNP (last 3 results) No results for input(s): PROBNP in the last 8760 hours.  Other results:  I have pearsonaly reviewed this: ECG REPORT  Rate: 65  Rhythm: Normal sinus rhythm ST&T Change: No ischemic changes QTC 436  There were no vitals filed for this visit.   Cultures:    Component Value Date/Time   SDES URINE, RANDOM 04/27/2012 2313   SPECREQUEST NONE 04/27/2012 2313   CULT NO GROWTH 5 DAYS 04/27/2012 1602   REPTSTATUS 04/28/2012 FINAL 04/27/2012 2313     Radiological Exams on Admission: Dg Chest 2 View  02/01/2016  CLINICAL DATA:  Cough EXAM: CHEST  2 VIEW COMPARISON:  01/14/2016 FINDINGS: Chronic cardiomegaly with pulmonary venous congestion. Negative aortic contours. There is no edema, consolidation, effusion, or pneumothorax. IMPRESSION: Cardiomegaly and pulmonary venous congestion. Electronically Signed   By: Monte Fantasia M.D.   On: 02/01/2016 02:49   Ct Head Wo Contrast  02/01/2016  CLINICAL DATA:  Headache and right eye blurred vision since beginning Xarelto. EXAM: CT HEAD WITHOUT CONTRAST TECHNIQUE: Contiguous axial images were obtained from the base of the skull through the vertex without intravenous contrast. COMPARISON:  10/09/2013 FINDINGS: Skull and Sinuses:Negative for fracture or  destructive process. The visualized mastoids, middle ears, and imaged paranasal sinuses are clear. Visualized orbits: Negative. Brain: There is unexpected subarachnoid high-density effacement in the left parietal occipital region, newly seen. This is likely fading subarachnoid hemorrhage. Reversible cerebral vasoconstrictive syndrome, posterior reversible encephalopathy syndrome (which would have to be occult by CT), or anti coagulation complication are primary considerations. No cortical or subcortical low-density. No evidence of acute infarct. No hydrocephalus or shift. These results were called by telephone at the time of interpretation on 02/01/2016 at 2:44 am to Dr. Noemi Chapel , who verbally acknowledged these results. IMPRESSION: Localized subarachnoid hemorrhage/debris in the left parietal occipital region as discussed above. MRI/MRA recommended. Electronically Signed   By: Monte Fantasia M.D.   On: 02/01/2016 02:48    Chart has been reviewed  Family   at  Bedside  plan of care was discussed with  Husband Geraldene Holk [phone]  Assessment/Plan  60 year old female with history of morbid obesity team hypertension poorly controlled diabetes and COPD the past of heart failure was recently diagnosed with hepatic cirrhosis thought to be in no alcoholic, and splenic infarct started on anticoagulation presents with central loss of vision and headache and vertigo with CT scan of the head showing small subarachnoid bleed per discussion with neurology worrisome hemorrhagic conversion of CVA  Present on Admission:  Subarachnoid bleed if possible CVA appreciate neurology consult  - will admit based on TIA/CVA protocol, await results of MRA/MRI,   Echo has been done recently will need cardiology consult for TEE given mild fever, obtain cardiac enzymes,  ECG,   Lipid panel, TSH. Order PT/OT evaluation. For now hold off on aspirin given subarachnoid bleed.  Hold all anticoagulation, If evidence of  significant had bleed will need reversal of anticoagulation.  Neurology consulted defer to than regarding subarachnoid hemorrhage.       Marland Kitchen Splenic infarct - review of records showing hematology felt that venous thrombosis was not contributing factor. We'll hold off on anticoagulation for now given intracranial bleeding, will need to evaluate for source of emboli, Cardiology consult for TEE . Pulmonary hypertension assoc with unclear multi-factorial mechanisms ([place]) . Morbid obesity ([place]) chronic continue to monitor  . HTN (hypertension)  chronic, permissive hypertension . Hepatic cirrhosis (HCC) newly diagnosed will need to  follow-up with GI at the time of discharge  . COPD (chronic obstructive pulmonary disease) (HCC) chronic stable continue home medications  . Chronic diastolic CHF (congestive heart failure) (H[facility]) stable currently does not appear to be fluid overloaded  . Leukocytosis in a setting of recent diarrhea we'll check for C. difficile. Low grade fever, obtain blood cultures will need TEE. For now hold off on antibiotics given Diarrhea will eval for c.diff. Obtain blood cultures.  . Diarrhea - recently admitted possible antibiotic exposure we'll check for C. difficile as well as stool PCR  . Hyponatremia -  this is chronic likely secondary to history of CHF as well as hepatic dysfunction. Obtain urine electrolytes   Prophylaxis: SCD   CODE STATUS:  FULL CODE as per family  Disposition: To home once workup is complete and patient is stable  Other plan as per orders.  I have spent a total of 12min on this admission  extra time was spent to discuss case Dr. Kandis Ban 02/01/2016, 6:12 AM    [facility]  Pager [phone]   after 2 AM please page floor coverage PA If 7AM-7PM, please contact the day team taking care of the patient  [email or web address]  Password [email or web address]

## 2016-02-02 ENCOUNTER — Inpatient Hospital Stay (HOSPITAL_COMMUNITY): Payer: Medicaid Other

## 2016-02-02 DIAGNOSIS — I639 Cerebral infarction, unspecified: Secondary | ICD-10-CM

## 2016-02-02 LAB — GLUCOSE, CAPILLARY
GLUCOSE-CAPILLARY: 281 mg/dL — AB (ref 65–99)
GLUCOSE-CAPILLARY: 242 mg/dL — AB (ref 65–99)
GLUCOSE-CAPILLARY: 192 mg/dL — AB (ref 65–99)
Glucose-Capillary: 255 mg/dL — ABNORMAL HIGH (ref 65–99)

## 2016-02-02 LAB — LIPID PANEL
CHOL/HDL RATIO: 7.4 ratio
Cholesterol: 155 mg/dL (ref 0–200)
HDL: 21 mg/dL — ABNORMAL LOW (ref >40)
LDL CALC: 107 mg/dL — AB (ref 0–99)
Triglycerides: 137 mg/dL (ref <150)
VLDL: 27 mg/dL (ref 0–40)

## 2016-02-02 MED ORDER — DEXTROSE 5 % IV SOLN
1.0000 g | INTRAVENOUS | Status: DC
  Administered 2016-02-02 – 2016-02-03 (×2): 1 g via INTRAVENOUS
  Filled 2016-02-02 (×2): qty 10

## 2016-02-02 NOTE — Progress Notes (Signed)
CBG 265.  MD notified, patient was already given 5 units of Lantus.  MD ordered 5 units of Novolog. Will continue to monitor patient.

## 2016-02-02 NOTE — Evaluation (Signed)
Physical Therapy Evaluation Patient Details Name: Margaret [name] MRN: [id number] DOB: 1956-08-31 Today's Date: 02/02/2016   History of Present Illness  Pt adm with headache and vision changes. 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 Pt needs skilled PT to maximize independence and safety to allow discharge to home. Visual changes are pt's biggest complaint. Gait tentative due to the decr vision. Pt denied any dizziness or spinning with mobility and head turns. Will follow acutely but doubt will need PT after DC.     Follow Up Recommendations No PT follow up    Equipment Recommendations  None recommended by PT    Recommendations for Other Services       Precautions / Restrictions Precautions Precautions: None Restrictions Weight Bearing Restrictions: No      Mobility  Bed Mobility               General bed mobility comments: Pt up in bathroom  Transfers Overall transfer level: Needs assistance Equipment used: None Transfers: Sit to/from Stand Sit to Stand: Supervision         General transfer comment: for safety   Ambulation/Gait Ambulation/Gait assistance: Min guard Ambulation Distance (Feet): 150 Feet Assistive device: None Gait Pattern/deviations: Step-through pattern;Decreased stride length Gait velocity: decr Gait velocity interpretation: Below normal speed for age/gender General Gait Details: Gait tentative primarily due to decr vision. SpO2 - 85% on RA. Pt denied any type of dizziness or spinning with any mobility including head turns with amb.  Stairs            Wheelchair Mobility    Modified Rankin (Stroke Patients Only) Modified Rankin (Stroke Patients Only) Pre-Morbid Rankin  Score: No symptoms Modified Rankin: Moderately severe disability     Balance Overall balance assessment: Needs assistance Sitting-balance support: No upper extremity supported;Feet supported Sitting balance-Leahy Scale: Normal     Standing balance support: No upper extremity supported;During functional activity Standing balance-Leahy Scale: Good                               Pertinent Vitals/Pain Pain Assessment: 0-10 Pain Score: 6  Pain Location: headache Pain Descriptors / Indicators: Aching Pain Intervention(s): Limited activity within patient's tolerance;Premedicated before session    Home Living Family/patient expects to be discharged to:: Private residence Living Arrangements: Spouse/significant other Available Help at Discharge: Family;Available PRN/intermittently Type of Home: House Home Access: Stairs to enter Entrance Stairs-Rails: Right Entrance Stairs-Number of Steps: 3 Home Layout: One level        Prior Function Level of Independence: Independent               Hand Dominance        Extremity/Trunk Assessment   Upper Extremity Assessment: Defer to OT evaluation           Lower Extremity Assessment: Overall WFL for tasks assessed         Communication   Communication: No difficulties  Cognition Arousal/Alertness: Awake/alert Behavior During Therapy: WFL for tasks assessed/performed Overall Cognitive Status: Within Functional Limits for tasks assessed                      General Comments      Exercises  Assessment/Plan    PT Assessment Patient needs continued PT services  PT Diagnosis Difficulty walking   PT Problem List Decreased balance;Decreased mobility;Cardiopulmonary status limiting activity  PT Treatment Interventions Gait training;Stair training;Functional mobility training;Balance training;Patient/family education   PT Goals (Current goals can be found in the Care Plan section) Acute  Rehab PT Goals Patient Stated Goal: return home PT Goal Formulation: With patient Time For Goal Achievement: 02/09/16 Potential to Achieve Goals: Good    Frequency Min 3X/week   Barriers to discharge        Co-evaluation               End of Session   Activity Tolerance: Patient limited by fatigue Patient left: in chair;with call bell/phone within reach Nurse Communication: Mobility status         Time: 2956-2130 PT Time Calculation (min) (ACUTE ONLY): 19 min   Charges:   PT Evaluation $PT Eval Moderate Complexity: 1 Procedure     PT G Codes:        Neill Jurewicz 02-18-2016, 11:44 AM Skip Mayer PT [phone]

## 2016-02-02 NOTE — Progress Notes (Signed)
[facility] PROGRESS NOTE  Margaret [name] [id number] DOB: 1956/02/19 DOA: 02/01/2016 PCP: Harvie Junior, MD  Assessment/Plan:   Cir[facility] Of Care (subarachnoid hemorrhage) [facility]) - Neurology consulted and currently managing. Currently off anticoagulants - Currently undergoing workup - Placed order for TEE (cardiology will need to be contacted next am)  Active Problems:   HTN (hypertension) - currently on atenolol, will hold given bradycardia and soft blood pressures. We are currently trying to avoid hypotension    COPD (chronic obstructive pulmonary disease) (HCC) - Stable currently    Chronic diastolic CHF (congestive heart failure) ([place]) - compensated currently. Holding B blocker due to soft BP's and bradycardia    Insulin dependent diabetes mellitus (HCC) - Currently on lantus - continue carb modified diet - continue SSI    Essential hypertension - holding BP meds due to softer BP and recent CVA  Bacteremia - Place on Rocephin, awaiting final blood culture results.  - Will assess with TEE    Hepatic cirrhosis ([place])   Splenic infarct   Pulmonary hypertension assoc with unclear multi-factorial mechanisms (HCC)   Leukocytosis   Diarrhea - resolved - will d/c enteric precautions    Hyponatremia - stable  Code Status: full Family Communication: No family at bedside Disposition Plan: pending further work up   Consultants:  neurology  Procedures:  None  Antibiotics:  Rocephin  HPI/Subjective: Patient has a complaints still having vision problems right eye  Objective: Filed Vitals:   02/02/16 0923 02/02/16 1120  BP: 120/65 116/58  Pulse: 69 50  Temp:  97.5 F (36.4 C)  Resp: 23 24    Intake/Output Summary (Last 24 hours) at 02/02/16 1218 Last data filed at 02/02/16 1123  Gross per 24 hour  Intake   2540 ml  Output   2550 ml  Net    -10 ml   Filed Weights   02/01/16 1200  Weight: 125.4 kg (276 lb 7.3 oz)    Exam:   General:   Patient is alert and awake, in NAD  Cardiovascular: RRR, no rubs  Respiratory: cta bl, no wheezes  Abdomen: soft, NT, obese  Musculoskeletal: no cyanosis   Data Reviewed: Basic Metabolic Panel:  Recent Labs Lab 01/31/16 2232 01/31/16 2233  NA 131* 132*  K 4.9 4.6  CL 94* 94*  CO2 26  --   GLUCOSE 215* 211*  BUN 13 15  CREATININE 1.13* 1.10*  CALCIUM 10.2  --    Liver Function Tests:  Recent Labs Lab 01/31/16 2232  AST 18  ALT 12*  ALKPHOS 75  BILITOT 0.5  PROT 7.2  ALBUMIN 3.1*   No results for input(s): LIPASE, AMYLASE in the last 168 hours. No results for input(s): AMMONIA in the last 168 hours. CBC:  Recent Labs Lab 01/31/16 2232 01/31/16 2233  WBC 17.9*  --   NEUTROABS 15.2*  --   HGB 13.0 14.6  HCT 40.9 43.0  MCV 87.8  --   PLT 478*  --    Cardiac Enzymes:  Recent Labs Lab 02/01/16 0745 02/01/16 1442 02/01/16 1943  TROPONINI 0.04* 0.03 0.05*   BNP (last 3 results) No results for input(s): BNP in the last 8760 hours.  ProBNP (last 3 results) No results for input(s): PROBNP in the last 8760 hours.  CBG:  Recent Labs Lab 02/01/16 1252 02/01/16 1745 02/01/16 2206 02/02/16 0804 02/02/16 1205  GLUCAP 209* 274* 265* 255* 281*    Recent Results (from the past 240 hour(s))  Culture, blood (Routine X 2)  w Reflex to ID Panel     Status: None (Preliminary result)   Collection Time: 02/01/16  7:45 AM  Result Value Ref Range Status   Specimen Description BLOOD RIGHT ARM  Final   Special Requests BOTTLES DRAWN AEROBIC AND ANAEROBIC 10CC  Final   Culture  Setup Time NO ORGANISMS SEEN  Final   Culture   Final    CULTURE REINCUBATED FOR BETTER GROWTH CRITICAL RESULT CALLED TO, READ BACK BY AND VERIFIED WITH: S SOFA 02/02/16 @ 1014 M VESTAL    Report Status PENDING  Incomplete  Culture, blood (Routine X 2) w Reflex to ID Panel     Status: None (Preliminary result)   Collection Time: 02/01/16  8:00 AM  Result Value Ref Range Status    Specimen Description BLOOD LEFT HAND  Final   Special Requests BOTTLES DRAWN AEROBIC AND ANAEROBIC 10CC  Final   Culture  Setup Time NO ORGANISMS SEEN  Final   Culture   Final    CULTURE REINCUBATED FOR BETTER GROWTH CRITICAL RESULT CALLED TO, READ BACK BY AND VERIFIED WITH: S SOFA 02/02/16 @ 1014 M VESTAL    Report Status PENDING  Incomplete  MRSA PCR Screening     Status: None   Collection Time: 02/01/16 12:54 PM  Result Value Ref Range Status   MRSA by PCR NEGATIVE NEGATIVE Final    Comment:        The GeneXpert MRSA Assay (FDA approved for NASAL specimens only), is one component of a comprehensive MRSA colonization surveillance program. It is not intended to diagnose MRSA infection nor to guide or monitor treatment for MRSA infections.      Studies: Ct Angio Head W/cm &/or Wo Cm  02/01/2016  CLINICAL DATA:  Subarachnoid hemorrhage. Central vision loss associated with headache and lightheadedness. EXAM: CT ANGIOGRAPHY HEAD AND NECK TECHNIQUE: Multidetector CT imaging of the head and neck was performed using the standard protocol during bolus administration of intravenous contrast. Multiplanar CT image reconstructions and MIPs were obtained to evaluate the vascular anatomy. Carotid stenosis measurements (when applicable) are obtained utilizing NASCET criteria, using the distal internal carotid diameter as the denominator. CONTRAST:  19mL OMNIPAQUE IOHEXOL 350 MG/ML SOLN COMPARISON:  Head CT and head MRI/ MRA 02/01/2016 FINDINGS: CTA NECK Aortic arch: 3 vessel aortic arch with mild-to-moderate calcified plaque. No significant brachiocephalic or subclavian artery stenosis. Right carotid system: Mild mixed calcified and noncalcified plaque at the carotid bifurcation and in the proximal ICA resulting in mild luminal irregularity but no significant stenosis. Left carotid system: Mild atheromatous change throughout the mid and distal common carotid artery without significant stenosis. Mild  calcified and noncalcified plaque in the proximal ICA with luminal irregularity but no stenosis. Vertebral arteries: The vertebral arteries are patent with the left being mildly to moderately dominant. Left vertebral artery is widely patent. The right vertebral artery origin is suboptimally evaluated due to motion artifact. There is no evidence of flow limiting stenosis, although mild origin stenosis is not excluded. Skeleton: Mild disc and facet degeneration in the lower cervical spine. Other neck: Partially visualized enlargement of the main pulmonary artery which may reflect underlying pulmonary arterial hypertension. Limited evaluation of the lung apices due to moderate motion artifact. Ground-glass attenuation may be largely due to motion, however underlying edema or infection is questioned; correlate with chest radiography. CTA HEAD Anterior circulation: Internal carotid arteries are patent from skullbase to carotid termini. There is mild carotid siphon calcified plaque bilaterally without stenosis. ACAs and MCAs  are patent without evidence of major branch occlusion or significant stenosis. No intracranial aneurysm is identified. No vascular malformation is identified, including in the left parietal region at the site of previously demonstrated subarachnoid hemorrhage. Posterior circulation: The intracranial vertebral arteries are patent to the basilar with the left being dominant. PICA and SCA origins are patent. Basilar artery is patent without stenosis. There is a small posterior communicating artery on the left. PCAs are patent with slight irregularity diffusely, which may be related to motion, without evidence of significant proximal stenosis. Venous sinuses: Patent. Anatomic variants: None. Delayed phase: Small volume left parietal subarachnoid hemorrhage as previously demonstrated. No abnormal enhancement identified. IMPRESSION: 1. Mild intracranial and extracranial atherosclerosis without significant  stenosis. 2. Patent dural venous sinuses. No vascular malformation identified. Electronically Signed   By: Logan Bores M.D.   On: 02/01/2016 09:37   Dg Chest 2 View  02/01/2016  CLINICAL DATA:  Cough EXAM: CHEST  2 VIEW COMPARISON:  01/14/2016 FINDINGS: Chronic cardiomegaly with pulmonary venous congestion. Negative aortic contours. There is no edema, consolidation, effusion, or pneumothorax. IMPRESSION: Cardiomegaly and pulmonary venous congestion. Electronically Signed   By: Monte Fantasia M.D.   On: 02/01/2016 02:49   Ct Head Wo Contrast  02/01/2016  CLINICAL DATA:  Headache and right eye blurred vision since beginning Xarelto. EXAM: CT HEAD WITHOUT CONTRAST TECHNIQUE: Contiguous axial images were obtained from the base of the skull through the vertex without intravenous contrast. COMPARISON:  10/09/2013 FINDINGS: Skull and Sinuses:Negative for fracture or destructive process. The visualized mastoids, middle ears, and imaged paranasal sinuses are clear. Visualized orbits: Negative. Brain: There is unexpected subarachnoid high-density effacement in the left parietal occipital region, newly seen. This is likely fading subarachnoid hemorrhage. Reversible cerebral vasoconstrictive syndrome, posterior reversible encephalopathy syndrome (which would have to be occult by CT), or anti coagulation complication are primary considerations. No cortical or subcortical low-density. No evidence of acute infarct. No hydrocephalus or shift. These results were called by telephone at the time of interpretation on 02/01/2016 at 2:44 am to Dr. Noemi Chapel , who verbally acknowledged these results. IMPRESSION: Localized subarachnoid hemorrhage/debris in the left parietal occipital region as discussed above. MRI/MRA recommended. Electronically Signed   By: Monte Fantasia M.D.   On: 02/01/2016 02:48   Ct Angio Neck W/cm &/or Wo/cm  02/01/2016  CLINICAL DATA:  Subarachnoid hemorrhage. Central vision loss associated with  headache and lightheadedness. EXAM: CT ANGIOGRAPHY HEAD AND NECK TECHNIQUE: Multidetector CT imaging of the head and neck was performed using the standard protocol during bolus administration of intravenous contrast. Multiplanar CT image reconstructions and MIPs were obtained to evaluate the vascular anatomy. Carotid stenosis measurements (when applicable) are obtained utilizing NASCET criteria, using the distal internal carotid diameter as the denominator. CONTRAST:  39mL OMNIPAQUE IOHEXOL 350 MG/ML SOLN COMPARISON:  Head CT and head MRI/ MRA 02/01/2016 FINDINGS: CTA NECK Aortic arch: 3 vessel aortic arch with mild-to-moderate calcified plaque. No significant brachiocephalic or subclavian artery stenosis. Right carotid system: Mild mixed calcified and noncalcified plaque at the carotid bifurcation and in the proximal ICA resulting in mild luminal irregularity but no significant stenosis. Left carotid system: Mild atheromatous change throughout the mid and distal common carotid artery without significant stenosis. Mild calcified and noncalcified plaque in the proximal ICA with luminal irregularity but no stenosis. Vertebral arteries: The vertebral arteries are patent with the left being mildly to moderately dominant. Left vertebral artery is widely patent. The right vertebral artery origin is suboptimally evaluated due to  motion artifact. There is no evidence of flow limiting stenosis, although mild origin stenosis is not excluded. Skeleton: Mild disc and facet degeneration in the lower cervical spine. Other neck: Partially visualized enlargement of the main pulmonary artery which may reflect underlying pulmonary arterial hypertension. Limited evaluation of the lung apices due to moderate motion artifact. Ground-glass attenuation may be largely due to motion, however underlying edema or infection is questioned; correlate with chest radiography. CTA HEAD Anterior circulation: Internal carotid arteries are patent from  skullbase to carotid termini. There is mild carotid siphon calcified plaque bilaterally without stenosis. ACAs and MCAs are patent without evidence of major branch occlusion or significant stenosis. No intracranial aneurysm is identified. No vascular malformation is identified, including in the left parietal region at the site of previously demonstrated subarachnoid hemorrhage. Posterior circulation: The intracranial vertebral arteries are patent to the basilar with the left being dominant. PICA and SCA origins are patent. Basilar artery is patent without stenosis. There is a small posterior communicating artery on the left. PCAs are patent with slight irregularity diffusely, which may be related to motion, without evidence of significant proximal stenosis. Venous sinuses: Patent. Anatomic variants: None. Delayed phase: Small volume left parietal subarachnoid hemorrhage as previously demonstrated. No abnormal enhancement identified. IMPRESSION: 1. Mild intracranial and extracranial atherosclerosis without significant stenosis. 2. Patent dural venous sinuses. No vascular malformation identified. Electronically Signed   By: Logan Bores M.D.   On: 02/01/2016 09:37   Mr Angiogram Head Wo Contrast  02/01/2016  CLINICAL DATA:  Initial evaluation for acute visual changes. EXAM: MRI HEAD WITHOUT CONTRAST MRA HEAD WITHOUT CONTRAST TECHNIQUE: Multiplanar, multiecho pulse sequences of the brain and surrounding structures were obtained without intravenous contrast. Angiographic images of the head were obtained using MRA technique without contrast. COMPARISON:  Prior CT from earlier the same day. FINDINGS: MRI HEAD FINDINGS Study is degraded by motion artifact. Mild diffuse prominence of the CSF containing spaces is compatible with generalized cerebral atrophy. Mild scattered T2/FLAIR hyperintensity within the periventricular and deep white matter present, most consistent with chronic small vessel ischemic disease, mild for  age. There is subtle susceptibility artifact with hyperintense FLAIR signal intensity within the cortical sulci of the left parieto-occipital region, corresponding with abnormality seen on prior CT. Finding concerning for small volume subarachnoid hemorrhage/debris. Mild gyral swelling within this region without significant mass effect. No evidence for posterior reversible encephalopathy syndrome. No other hemorrhage seen elsewhere within the brain. Scattered small subcentimeter foci of restricted diffusion seen within the subcortical white matter and cortical gray matter of the bilateral parietal lobes (series 5, image 29, 32) additional small punctate focus of restricted diffusion more inferiorly at the level of the subarachnoid hemorrhage. Major intracranial vascular flow voids are maintained. No mass lesion, midline shift, or mass effect. No hydrocephalus. No extra-axial fluid collection. Craniocervical junction normal. Visualized upper cervical spine within normal limits. Pituitary gland normal. No acute abnormality about the orbits. Paranasal sinuses and mastoids are clear. Inner ear structures within normal limits. Bone marrow signal intensity normal. Scalp soft tissues unremarkable. MRA HEAD FINDINGS ANTERIOR CIRCULATION: Visualized distal cervical segments of the internal carotid arteries are patent with antegrade flow. Petrous, cavernous, and supraclinoid ICA is widely patent. A1 segments, anterior communicating artery, and anterior cerebral arteries well opacified. M1 segments widely patent without stenosis or occlusion. MCA branches normal. Distal MCA branches well opacified. POSTERIOR CIRCULATION: Vertebral arteries patent to the vertebrobasilar junction. Left vertebral artery dominant. Posterior inferior cerebral arteries patent. Basilar artery well opacified  to its distal aspect. Superior cerebellar and posterior cerebral arteries well opacified bilaterally. Small left posterior communicating artery  noted. No aneurysm. No obvious high-grade stenosis with the intracranial circulation on this motion degraded study. IMPRESSION: MRI HEAD IMPRESSION: 1. Small volume subarachnoid hemorrhage/debris within the left parieto-occipital region, corresponding to abnormality seen on prior CT. Minimal gyral swelling within this region without significant mass effect or other abnormality. 2. Few sub cm cortical/subcortical ischemic infarcts within the bilateral parietal lobes as above. MRA HEAD IMPRESSION: Negative intracranial MRA. Electronically Signed   By: Jeannine Boga M.D.   On: 02/01/2016 06:47   Mr Brain Wo Contrast  02/01/2016  CLINICAL DATA:  Initial evaluation for acute visual changes. EXAM: MRI HEAD WITHOUT CONTRAST MRA HEAD WITHOUT CONTRAST TECHNIQUE: Multiplanar, multiecho pulse sequences of the brain and surrounding structures were obtained without intravenous contrast. Angiographic images of the head were obtained using MRA technique without contrast. COMPARISON:  Prior CT from earlier the same day. FINDINGS: MRI HEAD FINDINGS Study is degraded by motion artifact. Mild diffuse prominence of the CSF containing spaces is compatible with generalized cerebral atrophy. Mild scattered T2/FLAIR hyperintensity within the periventricular and deep white matter present, most consistent with chronic small vessel ischemic disease, mild for age. There is subtle susceptibility artifact with hyperintense FLAIR signal intensity within the cortical sulci of the left parieto-occipital region, corresponding with abnormality seen on prior CT. Finding concerning for small volume subarachnoid hemorrhage/debris. Mild gyral swelling within this region without significant mass effect. No evidence for posterior reversible encephalopathy syndrome. No other hemorrhage seen elsewhere within the brain. Scattered small subcentimeter foci of restricted diffusion seen within the subcortical white matter and cortical gray matter of the  bilateral parietal lobes (series 5, image 29, 32) additional small punctate focus of restricted diffusion more inferiorly at the level of the subarachnoid hemorrhage. Major intracranial vascular flow voids are maintained. No mass lesion, midline shift, or mass effect. No hydrocephalus. No extra-axial fluid collection. Craniocervical junction normal. Visualized upper cervical spine within normal limits. Pituitary gland normal. No acute abnormality about the orbits. Paranasal sinuses and mastoids are clear. Inner ear structures within normal limits. Bone marrow signal intensity normal. Scalp soft tissues unremarkable. MRA HEAD FINDINGS ANTERIOR CIRCULATION: Visualized distal cervical segments of the internal carotid arteries are patent with antegrade flow. Petrous, cavernous, and supraclinoid ICA is widely patent. A1 segments, anterior communicating artery, and anterior cerebral arteries well opacified. M1 segments widely patent without stenosis or occlusion. MCA branches normal. Distal MCA branches well opacified. POSTERIOR CIRCULATION: Vertebral arteries patent to the vertebrobasilar junction. Left vertebral artery dominant. Posterior inferior cerebral arteries patent. Basilar artery well opacified to its distal aspect. Superior cerebellar and posterior cerebral arteries well opacified bilaterally. Small left posterior communicating artery noted. No aneurysm. No obvious high-grade stenosis with the intracranial circulation on this motion degraded study. IMPRESSION: MRI HEAD IMPRESSION: 1. Small volume subarachnoid hemorrhage/debris within the left parieto-occipital region, corresponding to abnormality seen on prior CT. Minimal gyral swelling within this region without significant mass effect or other abnormality. 2. Few sub cm cortical/subcortical ischemic infarcts within the bilateral parietal lobes as above. MRA HEAD IMPRESSION: Negative intracranial MRA. Electronically Signed   By: Jeannine Boga M.D.   On:  02/01/2016 06:47    Scheduled Meds: . albuterol  2.5 mg Nebulization QHS  . antiseptic oral rinse  7 mL Mouth Rinse BID  . atenolol  12.5 mg Oral BID  . cefTRIAXone (ROCEPHIN)  IV  1 g Intravenous Q24H  .  insulin aspart  0-9 Units Subcutaneous TID WC  . insulin glargine  5 Units Subcutaneous QHS  . pravastatin  20 mg Oral q1800  . spironolactone  50 mg Oral Daily   Continuous Infusions: . sodium chloride 50 mL/hr at 02/02/16 0700    Time spent: > 35 minutes  Velvet Bathe  [facility] Pager [phone] If 7PM-7AM, please contact night-coverage at [email or web address], password [facility] 02/02/2016, 12:18 PM  LOS: 1 day

## 2016-02-02 NOTE — Progress Notes (Addendum)
CRITICAL VALUE ALERT  Critical value received:  Positive blood cultures both bottles: Gram positive Coco Bacilli  Date of notification:  03/12/20167  Time of notification:  [id number]  Critical value read back:Yes.    Nurse who received alert:  Allegra Lai  MD notified (1st page):  Dr. Wendee Beavers  Time of first page:  1018  Responding MD:  Dr. Wendee Beavers  Time MD responded:  1020

## 2016-02-02 NOTE — Progress Notes (Signed)
STROKE TEAM PROGRESS NOTE   SUBJECTIVE (INTERVAL HISTORY) Pt no complains over night, no acute issue. Left eye spinning light went away, still has some gray area in central vision at right. There are floaters both eyes. No HA.    OBJECTIVE Temp:  [97.4 F (36.3 C)-98.3 F (36.8 C)] 97.5 F (36.4 C) (03/12 1120) Pulse Rate:  [50-71] 50 (03/12 1120) Cardiac Rhythm:  [-] Normal sinus rhythm (03/12 0746) Resp:  [15-29] 24 (03/12 1120) BP: (97-122)/(49-89) 116/58 mmHg (03/12 1120) SpO2:  [92 %-100 %] 98 % (03/12 1120)  CBC:   Recent Labs Lab 01/31/16 2232 01/31/16 2233  WBC 17.9*  --   NEUTROABS 15.2*  --   HGB 13.0 14.6  HCT 40.9 43.0  MCV 87.8  --   PLT 478*  --     Basic Metabolic Panel:   Recent Labs Lab 01/31/16 2232 01/31/16 2233  NA 131* 132*  K 4.9 4.6  CL 94* 94*  CO2 26  --   GLUCOSE 215* 211*  BUN 13 15  CREATININE 1.13* 1.10*  CALCIUM 10.2  --     Lipid Panel:     Component Value Date/Time   CHOL 228* 06/16/2012 0614   TRIG 127 06/16/2012 0614   HDL 47 06/16/2012 0614   CHOLHDL 4.9 06/16/2012 0614   VLDL 25 06/16/2012 0614   LDLCALC 156* 06/16/2012 0614   HgbA1c:  Lab Results  Component Value Date   HGBA1C 9.1* 01/14/2016   Urine Drug Screen: No results found for: LABOPIA, COCAINSCRNUR, LABBENZ, AMPHETMU, THCU, LABBARB    IMAGING I have personally reviewed the radiological images below and agree with the radiology interpretations.  Dg Chest 2 View 02/01/2016    Cardiomegaly and pulmonary venous congestion.   Ct Head Wo Contrast 02/01/2016   Localized subarachnoid hemorrhage/debris in the left parietal occipital region as discussed above. MRI/MRA recommended.   Mri and Mra Head Wo Contrast 02/01/2016   1. Small volume subarachnoid hemorrhage/debris within the left parieto-occipital region, corresponding to abnormality seen on prior CT. Minimal gyral swelling within this region without significant mass effect or other abnormality.  2.  Few sub cm cortical/subcortical ischemic infarcts within the bilateral parietal lobes as above.  3. Negative intracranial MRA.   CTA of head and neck 02/01/2016 1. Mild intracranial and extracranial atherosclerosis without significant stenosis. 2. Patent dural venous sinuses. No vascular malformation identified.  LE venous doppler - pending  EEG - pending   PHYSICAL EXAM  Temp:  [97.4 F (36.3 C)-98.3 F (36.8 C)] 97.5 F (36.4 C) (03/12 1120) Pulse Rate:  [50-71] 50 (03/12 1120) Resp:  [15-29] 24 (03/12 1120) BP: (97-122)/(49-89) 116/58 mmHg (03/12 1120) SpO2:  [92 %-100 %] 98 % (03/12 1120)  General - morbid obesity, well developed, in no apparent distress.  Ophthalmologic - Fundi not visualized due to small pupils.  Cardiovascular - Regular rate and rhythm.  Mental Status -  Level of arousal and orientation to time, place, and person were intact. Language including expression, naming, repetition, comprehension was assessed and found intact. Fund of Knowledge was assessed and was intact.  Cranial Nerves II - XII - II - Visual field intact OU. III, IV, VI - Extraocular movements intact. V - Facial sensation intact bilaterally. VII - Facial movement intact bilaterally. VIII - Hearing & vestibular intact bilaterally. X - Palate elevates symmetrically. XI - Chin turning & shoulder shrug intact bilaterally. XII - Tongue protrusion intact.  Motor Strength - [name] strength was normal  in all extremities and pronator drift was absent.  Bulk was normal and fasciculations were absent.   Motor Tone - Muscle tone was assessed at the neck and appendages and was normal.  Reflexes - [name] reflexes were 1+ in all extremities and she had no pathological reflexes.  Sensory - Light touch, temperature/pinprick were assessed and were symmetrical.    Coordination - The patient had normal movements in the hands and feet with no ataxia or dysmetria.  Tremor was  absent.  Gait and Station - deferred due to safety concerns.   ASSESSMENT/PLAN [name] is a 60 y.o. female with history of hypertension, hyperlipidemia, COPD, asthma, diabetes mellitus, migraines, panic attacks, and morbid obesity, presenting with mild headache, visual changes, low-grade fevers, and nausea. She did not receive IV t-PA due to late presentation and anticoagulation.  Small SAH within the left parieto-occipital region, may related to Xarelto use. Ruled out cerebral venous thrombosis by CTV.  Resultant  Seems asymptomatic  MRI - left parieto-occipital small SAH, punctate infarcts at the watershed area (left MCA/PCA, b/l MCA/ACA)  MRA - negative  CTA head and neck - no cerebral venous thrombosis, AVM, or aneurysm.  2D Echo - EF 60-65% in 12/2015  LE venous doppler - pending  EEG - pending  LDL - pending  HgbA1c pending  Hypercoagulable work up - pending  VTE prophylaxis - SCDs Diet Carb Modified Fluid consistency:: Thin; Room service appropriate?: Yes Diet NPO time specified  Xarelto (rivaroxaban) daily prior to admission, now on No antithrombotic secondary to [facility].  Patient counseled to be compliant with her antithrombotic medications  Ongoing aggressive stroke risk factor management  Therapy recommendations: Pending  Disposition: Pending  Stroke: At least 4 punctate infarct at the watershed area including left MCA/PCA, bilateral MCA/ACA, etiology unclear. Could be due to hypotension vs. Endocarditis (low grade fever, recent spleen infarct, elevated WBC) vs. afib (was told to have irregular heart beat 20 years ago but not sure if afib) vs. Hypercoagulable state  Noted that there was plan for outpt TEE at last discharge  Agree with TEE if this is still the plan at this time  May consider loop recorder, not able to do 30 day cardiac monitoring as pt has no insurance   Hypercoagulable panel - pending  Avoid low BP  ? Migraine visual  aura  Patient visual changes cannot be explained by [facility]  More consistent with migraine visual aura, however no significant migraine this time  Patient does have history of migraine  ? BPPV  Vertigo on position changes in bed on waking up  Short lasting  Consistent with BPPV  PT for maneuver training.  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   On Xarelto for 6 months, but on hold now due to [facility]  Discuss with primary team to find out the plan for anticoagulation long term  Hypertension  Stable mildly low blood pressures.  Avoid hypotension  Hyperlipidemia  Home meds:  Lovastatin 20 mg daily resumed in hospital  LDL pending, goal < 70  Continue statin at discharge  Diabetes  HgbA1c pending, goal < 7.0  Uncontrolled  On lantus  SSI  Other Stroke Risk Factors  Advanced age  Cigarette smoker, quit smoking 3 years ago.  Morbid obesity, Body mass index is 46 kg/(m^2).   Hx stroke/TIA  Family hx stroke (father)  Migraines  PVD -  aortic athrosclerosis  Other Active Problems  Leukocytosis  Elevated creatinine 1.10  Mild hyponatremia  Pulmonary HTN  Hilar lymphoadenopathy   Hospital day # 1  Rosalin Hawking, MD PhD Stroke Neurology 02/02/2016 12:48 PM     To contact Stroke Continuity provider, please refer to [email or web address] After hours, contact General Neurology

## 2016-02-02 NOTE — Progress Notes (Signed)
Pharmacy Antibiotic Note  Margaret [name] is a 60 y.o. female admitted on 02/01/2016 with bacteremia.  Pharmacy has been consulted for ceftriaxone dosing.  Last WBC 17.9, afebrile. Cr 1.1 with CrCl ~70 ml/min.  Plan: Ceftriaxone 1g q24h F/u blood cx, s/sx clinical improvement, and duration of therapy    Height: 5\' 5"  (165.1 cm) Weight: 276 lb 7.3 oz (125.4 kg) IBW/kg (Calculated) : 57  Temp (24hrs), Avg:97.8 F (36.6 C), Min:97.4 F (36.3 C), Max:98.3 F (36.8 C)   Recent Labs Lab 01/31/16 2232 01/31/16 2233  WBC 17.9*  --   CREATININE 1.13* 1.10*    Estimated Creatinine Clearance: 73.4 mL/min (by C-G formula based on Cr of 1.1).    Allergies  Allergen Reactions  . Codeine Hives, Itching and Other (See Comments)    "breathing problems"  . Latex Itching    Antimicrobials this admission: 3/12 ceftriaxone >>    Dose adjustments this admission: none  Microbiology results: 3/11 BCx: gram pos cocco bacilli 3/11 MRSA PCR: neg  Thank you for allowing pharmacy to be a part of this patient's care.  Wynelle Fanny 02/02/2016 10:52 AM

## 2016-02-02 NOTE — Progress Notes (Signed)
VASCULAR LAB PRELIMINARY  PRELIMINARY  PRELIMINARY  PRELIMINARY  Bilateral lower extremity venous duplex  completed.    Preliminary report:  Bilateral:  No evidence of DVT, superficial thrombosis, or Baker's Cyst.    Meir Elwood, RVT 02/02/2016, 12:59 PM

## 2016-02-03 ENCOUNTER — Ambulatory Visit (HOSPITAL_COMMUNITY): Payer: Self-pay

## 2016-02-03 LAB — FANA STAINING PATTERNS

## 2016-02-03 LAB — BASIC METABOLIC PANEL
Anion gap: 10 (ref 5–15)
BUN: 20 mg/dL (ref 6–20)
CHLORIDE: 98 mmol/L — AB (ref 101–111)
CO2: 26 mmol/L (ref 22–32)
CREATININE: 0.92 mg/dL (ref 0.44–1.00)
Calcium: 11 mg/dL — ABNORMAL HIGH (ref 8.9–10.3)
GFR calc Af Amer: >60 mL/min (ref >60)
GFR calc non Af Amer: >60 mL/min (ref >60)
GLUCOSE: 198 mg/dL — AB (ref 65–99)
POTASSIUM: 4.8 mmol/L (ref 3.5–5.1)
Sodium: 134 mmol/L — ABNORMAL LOW (ref 135–145)

## 2016-02-03 LAB — MPO/PR-3 (ANCA) ANTIBODIES: ANCA Proteinase 3: <3.5 U/mL (ref 0.0–3.5)

## 2016-02-03 LAB — ANCA TITERS
C-ANCA: <1:20 {titer}
P-ANCA: <1:20 {titer}

## 2016-02-03 LAB — GLUCOSE, CAPILLARY
GLUCOSE-CAPILLARY: 132 mg/dL — AB (ref 65–99)
Glucose-Capillary: 144 mg/dL — ABNORMAL HIGH (ref 65–99)
Glucose-Capillary: 159 mg/dL — ABNORMAL HIGH (ref 65–99)
Glucose-Capillary: 121 mg/dL — ABNORMAL HIGH (ref 65–99)

## 2016-02-03 LAB — SJOGRENS SYNDROME-B EXTRACTABLE NUCLEAR ANTIBODY: SSB (La) (ENA) Antibody, IgG: <0.2 AI (ref 0.0–0.9)

## 2016-02-03 LAB — CBC
HEMATOCRIT: 41.1 % (ref 36.0–46.0)
HEMOGLOBIN: 12.3 g/dL (ref 12.0–15.0)
MCH: 26.8 pg (ref 26.0–34.0)
MCHC: 29.9 g/dL — AB (ref 30.0–36.0)
MCV: 89.5 fL (ref 78.0–100.0)
Platelets: 474 10*3/uL — ABNORMAL HIGH (ref 150–400)
RBC: 4.59 MIL/uL (ref 3.87–5.11)
RDW: 15.1 % (ref 11.5–15.5)
WBC: 15.5 10*3/uL — ABNORMAL HIGH (ref 4.0–10.5)

## 2016-02-03 LAB — SJOGRENS SYNDROME-A EXTRACTABLE NUCLEAR ANTIBODY

## 2016-02-03 LAB — HEMOGLOBIN A1C
HEMOGLOBIN A1C: 8.4 % — AB (ref 4.8–5.6)
Hgb A1c MFr Bld: 8.1 % — ABNORMAL HIGH (ref 4.8–5.6)
Mean Plasma Glucose: 186 mg/dL
Mean Plasma Glucose: 194 mg/dL

## 2016-02-03 LAB — ANTI-DNA ANTIBODY, DOUBLE-STRANDED: DS DNA AB: 1 [IU]/mL (ref 0–9)

## 2016-02-03 LAB — ANTINUCLEAR ANTIBODIES, IFA: ANA Ab, IFA: POSITIVE — AB

## 2016-02-03 LAB — RHEUMATOID FACTOR: RHEUMATOID FACTOR: 19.8 [IU]/mL — AB (ref 0.0–13.9)

## 2016-02-03 LAB — HOMOCYSTEINE: HOMOCYSTEINE-NORM: 9.3 umol/L (ref 0.0–15.0)

## 2016-02-03 MED ORDER — DEXTROSE 5 % IV SOLN
2.0000 g | INTRAVENOUS | Status: DC
  Administered 2016-02-04 – 2016-02-06 (×3): 2 g via INTRAVENOUS
  Filled 2016-02-03 (×3): qty 2

## 2016-02-03 MED ORDER — STROKE: EARLY STAGES OF RECOVERY BOOK
Freq: Once | Status: AC
  Administered 2016-02-03: 1
  Filled 2016-02-03: qty 1

## 2016-02-03 MED ORDER — TRAMADOL HCL 50 MG PO TABS
50.0000 mg | ORAL_TABLET | Freq: Four times a day (QID) | ORAL | Status: DC | PRN
  Administered 2016-02-03 – 2016-02-05 (×4): 50 mg via ORAL
  Filled 2016-02-03 (×4): qty 1

## 2016-02-03 MED ORDER — HYDROXYZINE HCL 25 MG PO TABS
25.0000 mg | ORAL_TABLET | Freq: Three times a day (TID) | ORAL | Status: DC | PRN
  Administered 2016-02-03 – 2016-02-13 (×6): 25 mg via ORAL
  Filled 2016-02-03 (×6): qty 1

## 2016-02-03 MED ORDER — DEXTROSE 5 % IV SOLN
1.0000 g | INTRAVENOUS | Status: AC
  Administered 2016-02-03: 1 g via INTRAVENOUS
  Filled 2016-02-03: qty 10

## 2016-02-03 NOTE — Progress Notes (Signed)
[facility] PROGRESS NOTE  SHEVAWN [name] [id number] DOB: 1956/01/10 DOA: 02/01/2016 PCP: Harvie Junior, MD  Brief Narrative - patient is a 60 year old who had complaint of vision deficits and on further evaluation was noted to have subarachnoid hemorrhage. Neurosurgery recommended medical management and neurology subsequently consulted and currently undergoing and managing workup further recommendations. Patient was found to have bacteremia and is currently awaiting TEE  Assessment/Plan:   SAH (subarachnoid hemorrhage) [facility]) - Neurology consulted and currently managing. Currently off anticoagulants - Currently undergoing workup - Placed order for TEE (cardiology will need to be contacted next am)  Active Problems:   HTN (hypertension) - currently on atenolol, will hold given bradycardia and soft blood pressures. We are currently trying to avoid hypotension    COPD (chronic obstructive pulmonary disease) (HCC) - Stable currently    Chronic diastolic CHF (congestive heart failure) ([place]) - compensated currently. Continue holding B blocker due to soft BP's and bradycardia    Insulin dependent diabetes mellitus (HCC) - Currently on lantus - continue carb modified diet - continue SSI    Essential hypertension - holding BP meds due to softer BP and recent CVA  Bacteremia - Place on Rocephin, awaiting final blood culture results.  - Will assess with TEE (Scheduled for Wednesday 3/15) - WBC trending down.    Hepatic cirrhosis ([place])   Splenic infarct   Pulmonary hypertension assoc with unclear multi-factorial mechanisms ([place])   Leukocytosis   Diarrhea - resolved - will d/c enteric precautions    Hyponatremia - stable  Code Status: full Family Communication: No family at bedside Disposition Plan: pending further work up   Consultants:  neurology  Procedures:  None  Antibiotics:  Rocephin  HPI/Subjective: Patient is worried about vision  deficits in right eye but otherwise reports no new complaint  Objective: Filed Vitals:   02/03/16 0030 02/03/16 0414  BP: 128/58 123/57  Pulse: 69 72  Temp: 97.7 F (36.5 C) 97.6 F (36.4 C)  Resp: 19 18    Intake/Output Summary (Last 24 hours) at 02/03/16 1339 Last data filed at 02/03/16 1240  Gross per 24 hour  Intake 2007.5 ml  Output   2000 ml  Net    7.5 ml   Filed Weights   02/01/16 1200 02/02/16 1904  Weight: 125.4 kg (276 lb 7.3 oz) 124.013 kg (273 lb 6.4 oz)    Exam:   General:  Patient is alert and awake, in NAD  Cardiovascular: RRR, no rubs  Respiratory: cta bl, no wheezes  Abdomen: soft, NT, obese  Musculoskeletal: no cyanosis   Data Reviewed: Basic Metabolic Panel:  Recent Labs Lab 01/31/16 2232 01/31/16 2233 02/03/16 0032  NA 131* 132* 134*  K 4.9 4.6 4.8  CL 94* 94* 98*  CO2 26  --  26  GLUCOSE 215* 211* 198*  BUN 13 15 20   CREATININE 1.13* 1.10* 0.92  CALCIUM 10.2  --  11.0*   Liver Function Tests:  Recent Labs Lab 01/31/16 2232  AST 18  ALT 12*  ALKPHOS 75  BILITOT 0.5  PROT 7.2  ALBUMIN 3.1*   No results for input(s): LIPASE, AMYLASE in the last 168 hours. No results for input(s): AMMONIA in the last 168 hours. CBC:  Recent Labs Lab 01/31/16 2232 01/31/16 2233 02/03/16 1110  WBC 17.9*  --  15.5*  NEUTROABS 15.2*  --   --   HGB 13.0 14.6 12.3  HCT 40.9 43.0 41.1  MCV 87.8  --  89.5  PLT  478*  --  474*   Cardiac Enzymes:  Recent Labs Lab 02/01/16 0745 02/01/16 1442 02/01/16 1943  TROPONINI 0.04* 0.03 0.05*   BNP (last 3 results) No results for input(s): BNP in the last 8760 hours.  ProBNP (last 3 results) No results for input(s): PROBNP in the last 8760 hours.  CBG:  Recent Labs Lab 02/02/16 1205 02/02/16 1735 02/02/16 2314 02/03/16 0808 02/03/16 1144  GLUCAP 281* 242* 192* 132* 144*    Recent Results (from the past 240 hour(s))  Culture, blood (Routine X 2) w Reflex to ID Panel     Status:  None (Preliminary result)   Collection Time: 02/01/16  7:45 AM  Result Value Ref Range Status   Specimen Description BLOOD RIGHT ARM  Final   Special Requests BOTTLES DRAWN AEROBIC AND ANAEROBIC 10CC  Final   Culture  Setup Time   Final    GRAM POSITIVE COCCOBACILLUS IN BOTH AEROBIC AND ANAEROBIC BOTTLES CRITICAL RESULT CALLED TO, READ BACK BY AND VERIFIED WITH: C SOSA 02/02/16 @ 20 M VESTAL    Culture GRAM POSITIVE COCCOBACILLUS  Final   Report Status PENDING  Incomplete  Culture, blood (Routine X 2) w Reflex to ID Panel     Status: None (Preliminary result)   Collection Time: 02/01/16  8:00 AM  Result Value Ref Range Status   Specimen Description BLOOD LEFT HAND  Final   Special Requests BOTTLES DRAWN AEROBIC AND ANAEROBIC 10CC  Final   Culture  Setup Time   Final    GRAM POSITIVE COCCOBACILLUS IN BOTH AEROBIC AND ANAEROBIC BOTTLES CRITICAL RESULT CALLED TO, READ BACK BY AND VERIFIED WITH: C SOSA 02/02/16 @ 72 M VESTAL    Culture   Final    GRAM POSITIVE COCCOBACILLUS IDENTIFICATION AND SUSCEPTIBILITIES TO FOLLOW    Report Status PENDING  Incomplete  MRSA PCR Screening     Status: None   Collection Time: 02/01/16 12:54 PM  Result Value Ref Range Status   MRSA by PCR NEGATIVE NEGATIVE Final    Comment:        The GeneXpert MRSA Assay (FDA approved for NASAL specimens only), is one component of a comprehensive MRSA colonization surveillance program. It is not intended to diagnose MRSA infection nor to guide or monitor treatment for MRSA infections.      Studies: No results found.  Scheduled Meds: . albuterol  2.5 mg Nebulization QHS  . antiseptic oral rinse  7 mL Mouth Rinse BID  . [START ON 02/04/2016] cefTRIAXone (ROCEPHIN)  IV  2 g Intravenous Q24H  . insulin aspart  0-9 Units Subcutaneous TID WC  . insulin glargine  5 Units Subcutaneous QHS  . pravastatin  20 mg Oral q1800  . spironolactone  50 mg Oral Daily   Continuous Infusions: . sodium chloride 50  mL/hr at 02/02/16 2041    Time spent: > 35 minutes  Velvet Bathe  [facility] Pager [phone] If 7PM-7AM, please contact night-coverage at [email or web address], password [facility] 02/03/2016, 1:39 PM  LOS: 2 days

## 2016-02-03 NOTE — Evaluation (Signed)
Speech Language Pathology Evaluation Patient Details Name: Margaret [name] MRN: [id number] DOB: Jul 18, 1956 Today's Date: 02/03/2016 Time: 1450-1520 SLP Time Calculation (min) (ACUTE ONLY): 30 min  Problem List:  Patient Active Problem List   Diagnosis Date Noted  . Subarachnoid hemorrhage ([place]) 02/01/2016  . Leukocytosis 02/01/2016  . Diarrhea 02/01/2016  . Hyponatremia 02/01/2016  . Subarachnoid bleed ([place]) 02/01/2016  . SAH (subarachnoid hemorrhage) ([place])   . Stroke ([place])   . Visual changes   . Pulmonary infiltrates 01/23/2016  . Pulmonary hypertension assoc with unclear multi-factorial mechanisms ([place]) 01/23/2016  . 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 Insulin dependent diabetes mellitus ([place]) 01/14/2016  . Essential hypertension 01/14/2016  . Hepatic cirrhosis ([place]) 01/14/2016  . Nausea & vomiting 01/14/2016  . Splenic infarct 01/14/2016  . Upper abdominal pain   . COPD (chronic obstructive pulmonary disease) ([place]) 04/27/2012  . Healthcare-associated pneumonia 04/27/2012  . Hemoptysis 04/27/2012  . Chronic diastolic CHF (congestive heart failure) ([place]) 04/27/2012  . Morbid obesity ([place]) 04/27/2012  . Hypoxia 04/27/2012  . Dyspnea on exertion 04/27/2012  . Smoker 04/27/2012  . HTN (hypertension) 04/20/2012  . DM (diabetes mellitus) ([place]) 04/20/2012   Past Medical History:  Past Medical History  Diagnosis Date  . Hypertension   . Hyperlipidemia   . COPD (chronic obstructive pulmonary disease) ([place]) 04/20/12  . Asthma   . Type II diabetes mellitus ([place])   . Anemia   . H/O hiatal hernia   . Osteoarthritis   . Pneumonia 04/2012  . Migraines   . Panic attacks 04/20/12  . Morbid obesity ([place])   . Heart murmur    Past Surgical History:  Past Surgical History  Procedure Laterality Date  . Laceration repair  1966    "right upper arm"  . Dilation and curettage of uterus  1990's  . Finger reattachment  1973    "right ring finger"   HPI:  Margaret [name] is a  60 y.o. female has a past medical history of Hypertension; Hyperlipidemia; COPD (chronic obstructive pulmonary disease) ([place]) (04/20/12); Asthma; Type II diabetes mellitus ([place]); Anemia; H/O hiatal hernia; Osteoarthritis; Pneumonia (04/2012); Migraines; Panic attacks (04/20/12); Morbid obesity ([place]); and Heart murmur. Presented with blurred vision as well as central visual loss associated with headache and lightheadedness. She have had mild headaches before but today had a severe headache. Lightheadedness is worse standing up. This was associated some nausea but no vomiting she has been having some diarrhea ever since her discharge.   Assessment / Plan / Recommendation Clinical Impression  Cognitive/linguistic evaluation was completed.  The patient scored a 28/30 on the Mini Mental State Exam indicating functional skills.  [name] problem solving skills, insight/judgment and motor speech were functional.  Acute ST follow up is not indicated.      SLP Assessment  Patient does not need any further Speech Lanaguage Pathology Services    Follow Up Recommendations  None          SLP Evaluation Prior Functioning  Cognitive/Linguistic Baseline: Within functional limits Type of Home: House  Lives With: Spouse Available Help at Discharge: Family;Available PRN/intermittently Vocation: Full time employment   Cognition  Overall Cognitive Status: Within Functional Limits for tasks assessed Arousal/Alertness: Awake/alert Orientation Level: Oriented X4 Attention: Focused Focused Attention: Appears intact Memory: Appears intact Awareness: Appears intact Problem Solving: Appears intact Executive Function: Reasoning Reasoning: Appears intact Safety/Judgment: Appears intact    Comprehension  Auditory Comprehension Overall Auditory Comprehension: Appears within functional limits for tasks assessed Yes/No Questions: Within  Functional Limits Commands: Within Functional Limits Conversation:  Complex Reading Comprehension Reading Status: Within funtional limits    Expression Expression Primary Mode of Expression: Verbal Verbal Expression Overall Verbal Expression: Appears within functional limits for tasks assessed Initiation: No impairment Automatic Speech: Name;Social Response Level of Generative/Spontaneous Verbalization: Conversation Repetition: No impairment Naming: No impairment Pragmatics: No impairment Non-Verbal Means of Communication: Not applicable Written Expression Dominant Hand: Right Written Expression: Within Functional Limits   Oral / Motor  Oral Motor/Sensory Function Overall Oral Motor/Sensory Function: Within functional limits Motor Speech Overall Motor Speech: Appears within functional limits for tasks assessed Respiration: Within functional limits Phonation: Normal Resonance: Within functional limits Articulation: Within functional limitis Intelligibility: Intelligible Motor Planning: Witnin functional limits Motor Speech Errors: Not applicable Interfering Components: Premorbid status   GO          Functional Assessment Tool Used: ASHA NOMS and clinical judgment.   Functional Limitations: Attention Attention Current Status [id number]): 0 percent impaired, limited or restricted Attention Goal Status [id number]): 0 percent impaired, limited or restricted Attention Discharge Status [id number]): 0 percent impaired, limited or restricted         Shelly Flatten, MA, [place] [phone] Lamar Sprinkles 02/03/2016, 3:29 PM

## 2016-02-03 NOTE — Progress Notes (Signed)
Increased ceftriaxone to 2 g IV q24h for bacteremia. Speciation still pending.  [place], Pharm.D., BCPS Clinical Pharmacist Pager: [phone] 02/03/2016 12:02 PM

## 2016-02-03 NOTE — Progress Notes (Signed)
Occupational Therapy Evaluation Patient Details Name: Margaret [name] MRN: [id number] DOB: Apr 22, 1956 Today's Date: 02/03/2016    History of Present Illness Pt adm with headache and vision changes. 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 Pt presents with visual deficits impairing her ability to see, complaining of vision being "distorted and blurry" especially in her R central field. Pt apparently R eye dominant.  Began education on compensatory techniques and modification of environment. Recommend pt follow up with the low vision clinic at the neuro outpt center, in addition to having a vision exam completed. Will follow acutely to facilitate safe D/C home and address established goals. Pt very appreciative.     Follow Up Recommendations  Outpatient OT;Supervision - Intermittent;Other (comment) (low vision clinc at neuro outpt); eye doctor    Equipment Recommendations  None recommended by OT    Recommendations for Other Services Other (comment)     Precautions / Restrictions Precautions Precautions: None      Mobility Bed Mobility Overal bed mobility: Independent                Transfers Overall transfer level: Needs assistance     Sit to Stand: Supervision         General transfer comment: for safety     Balance     Sitting balance-Leahy Scale: Normal                                      ADL Overall ADL's : Needs assistance/impaired                                     Functional mobility during ADLs: Supervision/safety (supervision. ) General ADL Comments: Able to complete basic ADL tasks. Deficits seen with IADL tasks, i.e. writing,reaading, driving, cooking, working.     Vision Vision  Assessment?: Yes Eye Alignment: Within Functional Limits Ocular Range of Motion: Within Functional Limits Alignment/Gaze Preference: Within Defined Limits Tracking/Visual Pursuits: Decreased smoothness of horizontal tracking;Decreased smoothness of vertical tracking Saccades: Additional eye shifts occurred during testing Convergence: Within functional limits Visual Fields: Impaired-to be further tested in functional context   Perception Perception Perception Tested?: Yes Comments: Pt complains of spatial deficits   Praxis Praxis Praxis tested?: Within functional limits    Pertinent Vitals/Pain Pain Assessment: Faces Faces Pain Scale: Hurts a little bit Pain Location: headache Pain Descriptors / Indicators: Aching Pain Intervention(s): Limited activity within patient's tolerance     Hand Dominance Right   Extremity/Trunk Assessment Upper Extremity Assessment Upper Extremity Assessment: RUE deficits/detail   Lower Extremity Assessment Lower Extremity Assessment: Overall WFL for tasks assessed       Communication Communication Communication: No difficulties   Cognition Arousal/Alertness: Awake/alert Behavior During Therapy: WFL for tasks assessed/performed Overall Cognitive Status: Within Functional Limits for tasks assessed                     General Comments       Exercises       Shoulder Instructions      Home Living Family/patient expects to be discharged to:: Private residence Living Arrangements: Spouse/significant other  Available Help at Discharge: Family;Available PRN/intermittently Type of Home: House Home Access: Stairs to enter [employer] of Steps: 3 Entrance Stairs-Rails: Right Home Layout: One level     Bathroom Shower/Tub: Chief Strategy Officer: Standard                Prior Functioning/Environment Level of Independence: Independent             OT Diagnosis: Disturbance of vision   OT Problem  List: Impaired vision/perception;Decreased safety awareness;Obesity;Pain   OT Treatment/Interventions: Self-care/ADL training;DME and/or AE instruction;Therapeutic activities;Visual/perceptual remediation/compensation;Patient/family education    OT Goals(Current goals can be found in the care plan section) Acute Rehab OT Goals Patient Stated Goal: return to work  OT Goal Formulation: With patient Time For Goal Achievement: 02/17/16 Potential to Achieve Goals: Good ADL Goals Additional ADL Goal #1: Pt will verbalize understanding of need for sufficient lighting to assist with low vision Additional ADL Goal #2: Pt will verbalie use of contrast to compensate for visual loss Additional ADL Goal #3: Pt will demonstrate organized scanning techniques with S -[id number] L to R, top to bottom in pre reading  tasks to compensate for visual loss.  OT Frequency: Min 3X/week   Barriers to D/C:            Co-evaluation              End of Session Nurse Communication: Mobility status  Activity Tolerance: Patient tolerated treatment well Patient left: in bed;with call bell/phone within reach;with family/visitor present   Time: 1145-1230 OT Time Calculation (min): 45 min Charges:  OT General Charges $OT Visit: 1 Procedure OT Evaluation $OT Eval Moderate Complexity: 1 Procedure OT Treatments $Therapeutic Activity: 23-37 mins G-Codes:    Nabiha Planck,HILLARY 02-16-2016, 12:51 PM   [facility], OTR/L  [phone] February 16, 2016

## 2016-02-03 NOTE — Progress Notes (Signed)
Physical Therapy Treatment Patient Details Name: Margaret [name] MRN: [id number] DOB: 07-25-1956 Today's Date: 02/03/2016    History of Present Illness Pt adm with headache and vision changes. MRI showed left parieto-occipital small SAH and at least 4 punctate acute infarcts at watershed area including left MCA/PCA, bilaterally MCA/ACA    PT Comments    Pt performed increased gait distance with SPO2 85%-100%, dropped to 85% during seated rest break.  Pt required cues for pacing, education on stair training and safety to improve balance.    Follow Up Recommendations  No PT follow up     Equipment Recommendations  None recommended by PT    Recommendations for Other Services       Precautions / Restrictions Precautions Precautions: None Restrictions Weight Bearing Restrictions: No    Mobility  Bed Mobility Overal bed mobility: Independent                Transfers Overall transfer level: Needs assistance Equipment used: None Transfers: Sit to/from Stand Sit to Stand: Supervision         General transfer comment: demonstrated safe technique with increased time from multiple surfaces.    Ambulation/Gait Ambulation/Gait assistance: Min guard Ambulation Distance (Feet): 120 Feet (x2, 366ft on 3rd trial.  ) Assistive device: None Gait Pattern/deviations: Step-through pattern;Wide base of support;Decreased stride length Gait velocity: decr   General Gait Details: Pt reports fatigue requiring cues for reciprocal armswing.     Stairs Stairs: Yes Stairs assistance: Min assist Stair Management: No rails Number of Stairs: 9 (pt performed x3 stairs x 3 trials.  Pt performed with husband to improve technqiue for safe d/c home.  ) General stair comments: Pt performed with RHHA and non reciprocal pattern,  husband required cues for guarding technique to improve safety.    Wheelchair Mobility    Modified Rankin (Stroke Patients Only)       Balance             Standing balance-Leahy Scale: Good                      Cognition Arousal/Alertness: Awake/alert Behavior During Therapy: WFL for tasks assessed/performed Overall Cognitive Status: Within Functional Limits for tasks assessed                      Exercises      General Comments        Pertinent Vitals/Pain Pain Assessment: No/denies pain Pain Score: 4  Pain Location: Head Pain Intervention(s): Other (comment) (Pt declining pain medication.  )    Home Living     Available Help at Discharge: Family;Available PRN/intermittently Type of Home: House              Prior Function            PT Goals (current goals can now be found in the care plan section) Acute Rehab PT Goals Patient Stated Goal: return to work  Potential to Achieve Goals: Good Progress towards PT goals: Progressing toward goals    Frequency  Min 3X/week    PT Plan      Co-evaluation             End of Session Equipment Utilized During Treatment: Gait belt Activity Tolerance: Patient limited by fatigue Patient left: in bed;with call bell/phone within reach;with family/visitor present     Time: 1600-1636 PT Time Calculation (min) (ACUTE ONLY): 36 min  Charges:  $Gait Training: 23-37 mins  G Codes:      [name] 02/03/2016, 4:50 PM  [name], PTA pager [phone]

## 2016-02-03 NOTE — Progress Notes (Signed)
STROKE TEAM PROGRESS NOTE   SUBJECTIVE (INTERVAL HISTORY) Pt no complains over night, no acute issue. OT in room for evaluation. Left eye spinning light went away, still has some gray area in central vision at right. There are floaters both eyes. Complain of HA at night but not sustained. TEE not done yet.     OBJECTIVE Temp:  [97.6 F (36.4 C)-99.1 F (37.3 C)] 98.3 F (36.8 C) (03/13 1435) Pulse Rate:  [61-74] 70 (03/13 1435) Cardiac Rhythm:  [-] Normal sinus rhythm (03/13 0733) Resp:  [13-28] 18 (03/13 1435) BP: (106-128)/(49-66) 126/55 mmHg (03/13 1435) SpO2:  [91 %-96 %] 93 % (03/13 1435) Weight:  [273 lb 6.4 oz (124.013 kg)] 273 lb 6.4 oz (124.013 kg) (03/12 1904)  CBC:   Recent Labs Lab 01/31/16 2232 01/31/16 2233 02/03/16 1110  WBC 17.9*  --  15.5*  NEUTROABS 15.2*  --   --   HGB 13.0 14.6 12.3  HCT 40.9 43.0 41.1  MCV 87.8  --  89.5  PLT 478*  --  474*    Basic Metabolic Panel:   Recent Labs Lab 01/31/16 2232 01/31/16 2233 02/03/16 0032  NA 131* 132* 134*  K 4.9 4.6 4.8  CL 94* 94* 98*  CO2 26  --  26  GLUCOSE 215* 211* 198*  BUN 13 15 20   CREATININE 1.13* 1.10* 0.92  CALCIUM 10.2  --  11.0*    Lipid Panel:     Component Value Date/Time   CHOL 155 02/02/2016 1510   TRIG 137 02/02/2016 1510   HDL 21* 02/02/2016 1510   CHOLHDL 7.4 02/02/2016 1510   VLDL 27 02/02/2016 1510   LDLCALC 107* 02/02/2016 1510   HgbA1c:  Lab Results  Component Value Date   HGBA1C 8.4* 02/02/2016   Urine Drug Screen: No results found for: LABOPIA, COCAINSCRNUR, LABBENZ, AMPHETMU, THCU, LABBARB    IMAGING I have personally reviewed the radiological images below and agree with the radiology interpretations.  Dg Chest 2 View 02/01/2016    Cardiomegaly and pulmonary venous congestion.   Ct Head Wo Contrast 02/01/2016   Localized subarachnoid hemorrhage/debris in the left parietal occipital region as discussed above. MRI/MRA recommended.   Mri and Mra Head Wo  Contrast 02/01/2016   1. Small volume subarachnoid hemorrhage/debris within the left parieto-occipital region, corresponding to abnormality seen on prior CT. Minimal gyral swelling within this region without significant mass effect or other abnormality.  2. Few sub cm cortical/subcortical ischemic infarcts within the bilateral parietal lobes as above.  3. Negative intracranial MRA.   CTA of head and neck 02/01/2016 1. Mild intracranial and extracranial atherosclerosis without significant stenosis. 2. Patent dural venous sinuses. No vascular malformation identified.  LE venous doppler - Bilateral: No evidence of DVT, superficial thrombosis, or Baker's Cyst.  EEG - pending  TEE - pending   PHYSICAL EXAM  Temp:  [97.6 F (36.4 C)-99.1 F (37.3 C)] 98.3 F (36.8 C) (03/13 1435) Pulse Rate:  [61-74] 70 (03/13 1435) Resp:  [13-28] 18 (03/13 1435) BP: (106-128)/(49-66) 126/55 mmHg (03/13 1435) SpO2:  [91 %-96 %] 93 % (03/13 1435) Weight:  [273 lb 6.4 oz (124.013 kg)] 273 lb 6.4 oz (124.013 kg) (03/12 1904)  General - morbid obesity, well developed, in no apparent distress.  Ophthalmologic - Fundi not visualized due to small pupils.  Cardiovascular - Regular rate and rhythm.  Mental Status -  Level of arousal and orientation to time, place, and person were intact. Language including expression, naming, repetition,  comprehension was assessed and found intact. Fund of Knowledge was assessed and was intact.  Cranial Nerves II - XII - II - Visual field intact OU. III, IV, VI - Extraocular movements intact. V - Facial sensation intact bilaterally. VII - Facial movement intact bilaterally. VIII - Hearing & vestibular intact bilaterally. X - Palate elevates symmetrically. XI - Chin turning & shoulder shrug intact bilaterally. XII - Tongue protrusion intact.  Motor Strength - [name] strength was normal in all extremities and pronator drift was absent.  Bulk was normal and  fasciculations were absent.   Motor Tone - Muscle tone was assessed at the neck and appendages and was normal.  Reflexes - [name] reflexes were 1+ in all extremities and she had no pathological reflexes.  Sensory - Light touch, temperature/pinprick were assessed and were symmetrical.    Coordination - The patient had normal movements in the hands and feet with no ataxia or dysmetria.  Tremor was absent.  Gait and Station - deferred due to safety concerns.   ASSESSMENT/PLAN [name] is a 60 y.o. female with history of hypertension, hyperlipidemia, COPD, asthma, diabetes mellitus, migraines, panic attacks, and morbid obesity, presenting with mild headache, visual changes, low-grade fevers, and nausea. She did not receive IV t-PA due to late presentation and anticoagulation.  Small SAH within the left parieto-occipital region, may related to Xarelto use. Ruled out cerebral venous thrombosis by CTV.  Resultant  Seems asymptomatic  MRI - left parieto-occipital small SAH, punctate infarcts at the watershed area (left MCA/PCA, b/l MCA/ACA)  MRA - negative  CTA head and neck - no cerebral venous thrombosis, AVM, or aneurysm.  2D Echo - EF 60-65% in 12/2015  LE venous doppler - negative for DVT  EEG - pending  TEE - pending  LDL - 107  HgbA1c 8.4  Hypercoagulable work up - pending  VTE prophylaxis - SCDs Diet Carb Modified Fluid consistency:: Thin; Room service appropriate?: Yes  Xarelto (rivaroxaban) daily prior to admission, now on No antithrombotic secondary to [facility].  Patient counseled to be compliant with her antithrombotic medications  Ongoing aggressive stroke risk factor management  Stroke: At least 4 punctate infarct at the watershed area including left MCA/PCA, bilateral MCA/ACA, etiology unclear. Could be due to hypotension vs. Endocarditis (low grade fever, recent spleen infarct, elevated WBC) vs. afib (was told to have irregular heart beat 20  years ago but not sure if afib) vs. Hypercoagulable state  Noted that there was plan for outpt TEE at last discharge  TEE pending  May consider loop recorder, not able to do 30 day cardiac monitoring as pt has no insurance   Hypercoagulable panel - pending  Avoid low BP  ? Migraine visual aura  Patient visual changes cannot be explained by [facility]  More consistent with migraine visual aura, however no significant migraine this time  Patient does have history of migraine  ? BPPV  Vertigo on position changes in bed on waking up  Short lasting  Consistent with BPPV  PT for maneuver training.  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   On Xarelto for 6 months, but on hold now due to [facility]  Discuss with primary team to find out the plan for anticoagulation long term  Hypertension  Stable mildly low blood pressures.  Avoid hypotension  Hyperlipidemia  Home meds:  Lovastatin 20 mg daily resumed in  hospital  LDL 107, goal < 70  On pravastatin 20mg  now  Continue statin at discharge  Diabetes  HgbA1c 8.4, goal < 7.0  Uncontrolled  On lantus  SSI  Other Stroke Risk Factors  Advanced age  Cigarette smoker, quit smoking 3 years ago.  Morbid obesity, Body mass index is 45.5 kg/(m^2).   Hx stroke/TIA  Family hx stroke (father)  Migraines  PVD - aortic athrosclerosis  Other Active Problems  Leukocytosis  Elevated creatinine 1.10  Mild hyponatremia  Pulmonary HTN  Hilar lymphoadenopathy   Hospital day # 2  Rosalin Hawking, MD PhD Stroke Neurology 02/03/2016 2:36 PM     To contact Stroke Continuity provider, please refer to [email or web address] After hours, contact General Neurology

## 2016-02-04 ENCOUNTER — Inpatient Hospital Stay (HOSPITAL_COMMUNITY): Payer: Medicaid Other

## 2016-02-04 DIAGNOSIS — A491 Streptococcal infection, unspecified site: Secondary | ICD-10-CM | POA: Diagnosis present

## 2016-02-04 DIAGNOSIS — I6340 Cerebral infarction due to embolism of unspecified cerebral artery: Secondary | ICD-10-CM

## 2016-02-04 DIAGNOSIS — R4182 Altered mental status, unspecified: Secondary | ICD-10-CM

## 2016-02-04 DIAGNOSIS — B954 Other streptococcus as the cause of diseases classified elsewhere: Secondary | ICD-10-CM

## 2016-02-04 DIAGNOSIS — R7881 Bacteremia: Secondary | ICD-10-CM

## 2016-02-04 LAB — GLUCOSE, CAPILLARY
GLUCOSE-CAPILLARY: 147 mg/dL — AB (ref 65–99)
GLUCOSE-CAPILLARY: 160 mg/dL — AB (ref 65–99)
GLUCOSE-CAPILLARY: 171 mg/dL — AB (ref 65–99)
Glucose-Capillary: 168 mg/dL — ABNORMAL HIGH (ref 65–99)

## 2016-02-04 LAB — LUPUS ANTICOAGULANT PANEL
DRVVT: 57.5 s — AB (ref 0.0–44.0)
PTT Lupus Anticoagulant: 38.8 s (ref 0.0–43.6)

## 2016-02-04 LAB — BETA-2-GLYCOPROTEIN I ABS, IGG/M/A
Beta-2 Glyco I IgG: <9 GPI IgG units (ref 0–20)
Beta-2-Glycoprotein I IgA: <9 GPI IgA units (ref 0–25)
Beta-2-Glycoprotein I IgM: <9 GPI IgM units (ref 0–32)

## 2016-02-04 LAB — CULTURE, BLOOD (ROUTINE X 2)

## 2016-02-04 LAB — CARDIOLIPIN ANTIBODIES, IGG, IGM, IGA
Anticardiolipin IgA: <9 APL U/mL (ref 0–11)
Anticardiolipin IgG: 9 GPL U/mL (ref 0–14)
Anticardiolipin IgM: 9 MPL U/mL (ref 0–12)

## 2016-02-04 LAB — DRVVT CONFIRM: dRVVT Confirm: 1.2 ratio (ref 0.8–1.2)

## 2016-02-04 LAB — DRVVT MIX: DRVVT MIX: 47.3 s — AB (ref 0.0–44.0)

## 2016-02-04 MED ORDER — HYDROCODONE-ACETAMINOPHEN 5-325 MG PO TABS
1.0000 | ORAL_TABLET | ORAL | Status: DC | PRN
  Administered 2016-02-04 – 2016-02-14 (×21): 1 via ORAL
  Filled 2016-02-04 (×23): qty 1

## 2016-02-04 NOTE — Progress Notes (Signed)
EEG completed, results pending. 

## 2016-02-04 NOTE — Progress Notes (Addendum)
[facility] PROGRESS NOTE  [name] [id number] DOB: Oct 29, 1956 DOA: 02/01/2016 PCP: Margaret Lesch, MD Margaret [name] is a 60 y.o. female with Obesity, COPD, DM, HTN, admitted with punctate CVAs and SAH, she was recently admitted to [facility] with Splenic infarct and discharged home on xarelto, normal TEE then. Now with SAH and infarcts, Neuro consulting, Xarelto stopped Blood Cx now with Strep viridans, suspect IE, TEE tomorrow , ID consulted  Assessment/Plan: CVA -MRI w/small SAH, punctate infarcts at the watershed area -xarelto stopped -MRA - negative -CTA head and neck - no cerebral venous thrombosis, AVM, or aneurysm. 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 -check Hepatitis C Ab  DM -hbaic 8.4 -continue lantus, SSI  HTN -stable  Dyslipidemia -on pravastatin  Diarrhea -resolved  Morbid obesity  DVT proph: SCDs  Code Status: Full COde Family Communication: none at bedside Disposition Plan: home pending workup   Consultants: Neuro ID  HPI/Subjective: Aches all over, pain in toes  Objective: Filed Vitals:   02/04/16 0350 02/04/16 0946  BP: 143/67 126/63  Pulse: 81 82  Temp: 98 F (36.7 C) 98.1 F (36.7 C)  Resp: 19 18    Intake/Output Summary (Last 24 hours) at 02/04/16 1130 Last data filed at 02/04/16 0939  Gross per 24 hour  Intake 1330.8 ml  Output   2950 ml  Net -1619.2 ml   Filed Weights   02/01/16 1200 02/02/16 1904  Weight: 125.4 kg (276  lb 7.3 oz) 124.013 kg (273 lb 6.4 oz)    Exam:   General:AAOx3, morbidly obese, no distress but uncomfortable  Cardiovascular: S1S2/RRR, systolic murmur  Respiratory: diminished BS at bases  Abdomen: soft, Nt, BS present  Musculoskeletal: no edema, tenderness around both toes  Data Reviewed: Basic Metabolic Panel:  Recent Labs Lab 01/31/16 2232 01/31/16 2233 02/03/16 0032  NA 131* 132* 134*  K 4.9 4.6 4.8  CL 94* 94* 98*  CO2 26  --  26  GLUCOSE 215* 211* 198*  BUN 13 15 20   CREATININE 1.13* 1.10* 0.92  CALCIUM 10.2  --  11.0*   Liver Function Tests:  Recent Labs Lab 01/31/16 2232  AST 18  ALT 12*  ALKPHOS 75  BILITOT 0.5  PROT 7.2  ALBUMIN 3.1*   No results for input(s): LIPASE, AMYLASE in the last 168 hours. No results for input(s): AMMONIA in the last 168 hours. CBC:  Recent Labs Lab 01/31/16 2232 01/31/16 2233 02/03/16 1110  WBC 17.9*  --  15.5*  NEUTROABS 15.2*  --   --   HGB 13.0 14.6 12.3  HCT 40.9 43.0 41.1  MCV 87.8  --  89.5  PLT 478*  --  474*   Cardiac Enzymes:  Recent Labs Lab 02/01/16 0745 02/01/16 1442 02/01/16 1943  TROPONINI 0.04* 0.03 0.05*   BNP (last 3 results) No results for input(s): BNP in the last 8760 hours.  ProBNP (last 3 results) No results for input(s): PROBNP in the last 8760 hours.  CBG:  Recent Labs Lab  02/03/16 0808 02/03/16 1144 02/03/16 1717 02/03/16 2108 02/04/16 0803  GLUCAP 132* 144* 159* 121* 147*    Recent Results (from the past 240 hour(s))  Culture, blood (Routine X 2) w Reflex to ID Panel     Status: None   Collection Time: 02/01/16  7:45 AM  Result Value Ref Range Status   Specimen Description BLOOD RIGHT ARM  Final   Special Requests BOTTLES DRAWN AEROBIC AND ANAEROBIC 10CC  Final   Culture  Setup Time   Final    GRAM POSITIVE COCCOBACILLUS IN BOTH AEROBIC AND ANAEROBIC BOTTLES CRITICAL RESULT CALLED TO, READ BACK BY AND VERIFIED WITH: C SOSA 02/02/16 @ 1014 M VESTAL     Culture   Final    VIRIDANS STREPTOCOCCUS SUSCEPTIBILITIES PERFORMED ON PREVIOUS CULTURE WITHIN THE LAST 5 DAYS.    Report Status 02/04/2016 FINAL  Final  Culture, blood (Routine X 2) w Reflex to ID Panel     Status: None   Collection Time: 02/01/16  8:00 AM  Result Value Ref Range Status   Specimen Description BLOOD LEFT HAND  Final   Special Requests BOTTLES DRAWN AEROBIC AND ANAEROBIC 10CC  Final   Culture  Setup Time   Final    GRAM POSITIVE COCCOBACILLUS IN BOTH AEROBIC AND ANAEROBIC BOTTLES CRITICAL RESULT CALLED TO, READ BACK BY AND VERIFIED WITH: C SOSA 02/02/16 @ 1014 M VESTAL    Culture VIRIDANS STREPTOCOCCUS  Final   Report Status 02/04/2016 FINAL  Final   Organism ID, Bacteria VIRIDANS STREPTOCOCCUS  Final      Susceptibility   Viridans streptococcus - MIC*    PENICILLIN <=0.06 SENSITIVE Sensitive     CEFTRIAXONE <=0.12 SENSITIVE Sensitive     ERYTHROMYCIN <=0.12 SENSITIVE Sensitive     LEVOFLOXACIN 0.5 SENSITIVE Sensitive     VANCOMYCIN 0.5 SENSITIVE Sensitive     * VIRIDANS STREPTOCOCCUS  MRSA PCR Screening     Status: None   Collection Time: 02/01/16 12:54 PM  Result Value Ref Range Status   MRSA by PCR NEGATIVE NEGATIVE Final    Comment:        The GeneXpert MRSA Assay (FDA approved for NASAL specimens only), is one component of a comprehensive MRSA colonization surveillance program. It is not intended to diagnose MRSA infection nor to guide or monitor treatment for MRSA infections.      Studies: No results found.  Scheduled Meds: . albuterol  2.5 mg Nebulization QHS  . antiseptic oral rinse  7 mL Mouth Rinse BID  . cefTRIAXone (ROCEPHIN)  IV  2 g Intravenous Q24H  . insulin aspart  0-9 Units Subcutaneous TID WC  . insulin glargine  5 Units Subcutaneous QHS  . pravastatin  20 mg Oral q1800  . spironolactone  50 mg Oral Daily   Continuous Infusions: . sodium chloride 1,000 mL (02/03/16 1716)   Antibiotics Given (last 72 hours)    Date/Time  Action Medication Dose Rate   02/02/16 1123 Given   cefTRIAXone (ROCEPHIN) 1 g in dextrose 5 % 50 mL IVPB 1 g 100 mL/hr   02/03/16 1123 Given   cefTRIAXone (ROCEPHIN) 1 g in dextrose 5 % 50 mL IVPB 1 g 100 mL/hr   02/03/16 1242 Given   cefTRIAXone (ROCEPHIN) 1 g in dextrose 5 % 50 mL IVPB 1 g 100 mL/hr   02/04/16 1050 Given   cefTRIAXone (ROCEPHIN) 2 g in dextrose 5 % 50 mL IVPB 2 g 100 mL/hr      Active Problems:  HTN (hypertension)   COPD (chronic obstructive pulmonary disease) (HCC)   Chronic diastolic CHF (congestive heart failure) (HCC)   Morbid obesity (HCC)   Insulin dependent diabetes mellitus (HCC)   Essential hypertension   Hepatic cirrhosis (HCC)   Splenic infarct   Pulmonary hypertension assoc with unclear multi-factorial mechanisms (HCC)   Subarachnoid hemorrhage (HCC)   Leukocytosis   Diarrhea   Hyponatremia   Subarachnoid bleed (HCC)   SAH (subarachnoid hemorrhage) (HCC)   Stroke (HCC)   Visual changes    Time spent:    [facility]  [facility] Pager [phone]. If 7PM-7AM, please contact night-coverage at [email or web address], password [facility] 02/04/2016, 11:30 AM  LOS: 3 days

## 2016-02-04 NOTE — Progress Notes (Signed)
Occupational Therapy Treatment Patient Details Name: Margaret [name] MRN: [id number] DOB: September 13, 1956 Today's Date: 02/04/2016    History of present illness Pt adm with headache and vision changes. MRI showed left parieto-occipital small SAH and at least 4 punctate acute infarcts at watershed area including left MCA/PCA, bilaterally MCA/ACA   OT comments  Began education regarding environmental modifications to help compensate for vision loss. Also began working on visual scanning activities and appropriate search patterns. Pt given folder with established home program. Continue to recommend pt follow up with an eye doctor and follow up with OT at the low vision clinic at the [facility] outpt center. Will continue to follow.   Follow Up Recommendations  Outpatient OT;Supervision - Intermittent;Other (comment)    Equipment Recommendations  None recommended by OT    Recommendations for Other Services Other (comment)    Precautions / Restrictions Precautions Precautions: None Restrictions Weight Bearing Restrictions: No                 Vision                 Additional Comments: Pt states she feels her vision has improved slightly but that she continues to have distorted vision and "spots that she can not see"  Educated pt on visual scanning and improving scanning patterns. Educated on importance of appropriate lighting, use of contrast and decluttering/organizing environment to reduce visual strain and improve participation with activities. Discussed importance of using task lighting in kitchen and how this could be implemented with reading recipes. Also discussed importance of organizing a "medicine station" and making sure she had adequate lighting. Discussed asking for large print labels at the pharmacy. Pt verbalized understanding.  Pt states that she tried to do some of the "work" yesterday and how frustrating it was regarding her vision loss. Pt gave ideas to help  compensate during these activities, including use of lighting and guides to help structure information.               Cognition   Behavior During Therapy: WFL for tasks assessed/performed Overall Cognitive Status: Within Functional Limits for tasks assessed                                               General Comments      Pertinent Vitals/ Pain       Pain Assessment: No/denies pain Pain Score: 6  Pain Location: R great toe during movement and weight bearing.   Pain Descriptors / Indicators: Throbbing;Sore;Cramping Pain Intervention(s): Monitored during session;Repositioned;Relaxation  Home Living                                          Prior Functioning/Environment              Frequency Min 3X/week     Progress Toward Goals  OT Goals(current goals can now be found in the care plan section)  Progress towards OT goals: Progressing toward goals  Acute Rehab OT Goals Patient Stated Goal: return to work  OT Goal Formulation: With patient Time For Goal Achievement: 02/17/16 Potential to Achieve Goals: Good ADL Goals Additional ADL Goal #1: Pt will verbalize understanding of need for sufficient lighting to assist with low vision Additional ADL Goal #2:  Pt will verbalie use of contrast to compensate for visual loss Additional ADL Goal #3: Pt will demonstrate organized scanning techniques with S -fr20m L to R, top to bottom in pre reading  tasks to compensate for visual loss.  Plan Discharge plan remains appropriate    Co-evaluation                 End of Session     Activity Tolerance Patient tolerated treatment well   Patient Left in bed;with call bell/phone within reach   Nurse Communication Mobility status        Time: 1710-1736 OT Time Calculation (min): 26 min  Charges: OT General Charges $OT Visit: 1 Procedure OT Treatments $Therapeutic Activity: 23-37 mins  Namish Krise,HILLARY 02/04/2016, 5:40  PM   [facility], OTR/L  [phone] 02/04/2016

## 2016-02-04 NOTE — Progress Notes (Signed)
Physical Therapy Treatment Patient Details Name: Margaret [name] MRN: [id number] DOB: October 01, 1956 Today's Date: 02/04/2016    History of Present Illness Pt adm with headache and vision changes. MRI showed left parieto-occipital small SAH and at least 4 punctate acute infarcts at watershed area including left MCA/PCA, bilaterally MCA/ACA    PT Comments    Pt performed additonal stair training to improve safety for safe entry in home at d/c.  Pt reports fatigue and increased pain in R great toe.  Pt encouraged to ambulate this evening with her shoes to see if pain improves.    Follow Up Recommendations  No PT follow up     Equipment Recommendations  None recommended by PT    Recommendations for Other Services       Precautions / Restrictions Precautions Precautions: None Restrictions Weight Bearing Restrictions: No    Mobility  Bed Mobility Overal bed mobility: Independent             General bed mobility comments: Pt sitting edge of bed.    Transfers Overall transfer level: Modified independent Equipment used: None Transfers: Sit to/from Stand Sit to Stand: Modified independent (Device/Increase time)         General transfer comment: pt consistent with safe technique.    Ambulation/Gait Ambulation/Gait assistance: Supervision Ambulation Distance (Feet): 110 Feet (+60 ft + 110 ft.  ) Assistive device: None   Gait velocity: decr   General Gait Details: Pt reports fatigue requiring cues for reciprocal armswing.     Stairs Stairs: Yes Stairs assistance: Min guard Stair Management: No rails (Pt used HHA on L ascending and R descending from spouse.  ) Number of Stairs: 6 General stair comments: Pt performed x 2 trials with cues from PTA to spouse for guarding techniques.  Pt performed with min guard assist.    Wheelchair Mobility    Modified Rankin (Stroke Patients Only)       Balance Overall balance assessment: Needs assistance   Sitting  balance-Leahy Scale: Normal       Standing balance-Leahy Scale: Good                      Cognition Arousal/Alertness: Awake/alert Behavior During Therapy: WFL for tasks assessed/performed Overall Cognitive Status: Within Functional Limits for tasks assessed                      Exercises General Exercises - Lower Extremity Hip ABduction/ADduction: AROM;Right;10 reps;Standing (reports intolerable pain with standing on RLE to perform L side therefore therapeutic exercise d/c.  ) Hip Flexion/Marching: AROM;Both;10 reps;Standing Heel Raises: AROM;Standing;Both;10 reps Mini-Sqauts: AROM;Standing;Both;10 reps    General Comments        Pertinent Vitals/Pain Pain Assessment: 0-10 Pain Score: 6  Pain Location: R great toe during movement and weight bearing.   Pain Descriptors / Indicators: Throbbing;Sore;Cramping Pain Intervention(s): Monitored during session;Repositioned;Relaxation    Home Living                      Prior Function            PT Goals (current goals can now be found in the care plan section) Acute Rehab PT Goals Patient Stated Goal: return to work  Potential to Achieve Goals: Good Progress towards PT goals: Progressing toward goals    Frequency  Min 3X/week    PT Plan      Co-evaluation  End of Session Equipment Utilized During Treatment: Gait belt Activity Tolerance: Patient limited by fatigue Patient left: in bed;with call bell/phone within reach;with family/visitor present     Time: 1540-1611 PT Time Calculation (min) (ACUTE ONLY): 31 min  Charges:  $Gait Training: 8-22 mins $Therapeutic Exercise: 8-22 mins                    G Codes:      Margaret [name] 02-07-2016, 4:20 PM  Margaret [name], PTA pager [phone]

## 2016-02-04 NOTE — Progress Notes (Addendum)
STROKE TEAM PROGRESS NOTE   SUBJECTIVE (INTERVAL HISTORY) Pt no complains over night, no acute issue. Afebrile. Blood culture showed strep Viridans x 2. On rocephin. Repeat blood culture today. TEE tomorrow.     OBJECTIVE Temp:  [98 F (36.7 C)-98.7 F (37.1 C)] 98.1 F (36.7 C) (03/14 0946) Pulse Rate:  [70-85] 82 (03/14 0946) Cardiac Rhythm:  [-] Normal sinus rhythm (03/14 0734) Resp:  [18-20] 18 (03/14 0946) BP: (116-145)/(55-94) 126/63 mmHg (03/14 0946) SpO2:  [85 %-97 %] 97 % (03/14 0946)  CBC:   Recent Labs Lab 01/31/16 2232 01/31/16 2233 02/03/16 1110  WBC 17.9*  --  15.5*  NEUTROABS 15.2*  --   --   HGB 13.0 14.6 12.3  HCT 40.9 43.0 41.1  MCV 87.8  --  89.5  PLT 478*  --  474*    Basic Metabolic Panel:   Recent Labs Lab 01/31/16 2232 01/31/16 2233 02/03/16 0032  NA 131* 132* 134*  K 4.9 4.6 4.8  CL 94* 94* 98*  CO2 26  --  26  GLUCOSE 215* 211* 198*  BUN 13 15 20   CREATININE 1.13* 1.10* 0.92  CALCIUM 10.2  --  11.0*    Lipid Panel:     Component Value Date/Time   CHOL 155 02/02/2016 1510   TRIG 137 02/02/2016 1510   HDL 21* 02/02/2016 1510   CHOLHDL 7.4 02/02/2016 1510   VLDL 27 02/02/2016 1510   LDLCALC 107* 02/02/2016 1510   HgbA1c:  Lab Results  Component Value Date   HGBA1C 8.4* 02/02/2016   Urine Drug Screen: No results found for: LABOPIA, COCAINSCRNUR, LABBENZ, AMPHETMU, THCU, LABBARB    IMAGING I have personally reviewed the radiological images below and agree with the radiology interpretations.  Dg Chest 2 View 02/01/2016    Cardiomegaly and pulmonary venous congestion.   Ct Head Wo Contrast 02/01/2016   Localized subarachnoid hemorrhage/debris in the left parietal occipital region as discussed above. MRI/MRA recommended.   Mri and Mra Head Wo Contrast 02/01/2016   1. Small volume subarachnoid hemorrhage/debris within the left parieto-occipital region, corresponding to abnormality seen on prior CT. Minimal gyral swelling  within this region without significant mass effect or other abnormality.  2. Few sub cm cortical/subcortical ischemic infarcts within the bilateral parietal lobes as above.  3. Negative intracranial MRA.   CTA of head and neck 02/01/2016 1. Mild intracranial and extracranial atherosclerosis without significant stenosis. 2. Patent dural venous sinuses. No vascular malformation identified.  LE venous doppler - Bilateral: No evidence of DVT, superficial thrombosis, or Baker's Cyst.  EEG - pending  TEE - pending   PHYSICAL EXAM  Temp:  [98 F (36.7 C)-98.7 F (37.1 C)] 98.1 F (36.7 C) (03/14 0946) Pulse Rate:  [70-85] 82 (03/14 0946) Resp:  [18-20] 18 (03/14 0946) BP: (116-145)/(55-94) 126/63 mmHg (03/14 0946) SpO2:  [85 %-97 %] 97 % (03/14 0946)  General - morbid obesity, well developed, in no apparent distress.  Ophthalmologic - Fundi not visualized due to small pupils.  Cardiovascular - Regular rate and rhythm.  Mental Status -  Level of arousal and orientation to time, place, and person were intact. Language including expression, naming, repetition, comprehension was assessed and found intact. Fund of Knowledge was assessed and was intact.  Cranial Nerves II - XII - II - Visual field intact OU. III, IV, VI - Extraocular movements intact. V - Facial sensation intact bilaterally. VII - Facial movement intact bilaterally. VIII - Hearing & vestibular intact bilaterally. X -  Palate elevates symmetrically. XI - Chin turning & shoulder shrug intact bilaterally. XII - Tongue protrusion intact.  Motor Strength - [name] strength was normal in all extremities and pronator drift was absent.  Bulk was normal and fasciculations were absent.   Motor Tone - Muscle tone was assessed at the neck and appendages and was normal.  Reflexes - [name] reflexes were 1+ in all extremities and she had no pathological reflexes.  Sensory - Light touch, temperature/pinprick were  assessed and were symmetrical.    Coordination - The patient had normal movements in the hands and feet with no ataxia or dysmetria.  Tremor was absent.  Gait and Station - deferred due to safety concerns.   ASSESSMENT/PLAN [name] is a 60 y.o. female with history of hypertension, hyperlipidemia, COPD, asthma, diabetes mellitus, migraines, panic attacks, and morbid obesity, presenting with mild headache, visual changes, low-grade fevers, and nausea. She did not receive IV t-PA due to late presentation and anticoagulation.  Small SAH within the left parieto-occipital region, may related to Xarelto use. CTV has ruled out CVT.   Resultant  Seems asymptomatic  MRI - left parieto-occipital small SAH, punctate infarcts at the watershed area (left MCA/PCA, b/l MCA/ACA)  MRA - negative  CTA head and neck - no cerebral venous thrombosis, AVM, or aneurysm.  2D Echo - EF 60-65% in 12/2015  LE venous doppler - negative for DVT  EEG - pending  TEE - pending  LDL - 107  HgbA1c 8.4  Hypercoagulable work up - pending  VTE prophylaxis - SCDs Diet Carb Modified Fluid consistency:: Thin; Room service appropriate?: Yes Diet NPO time specified  Xarelto (rivaroxaban) daily prior to admission, now on No antithrombotic secondary to [facility].  Patient counseled to be compliant with her antithrombotic medications  Ongoing aggressive stroke risk factor management  Stroke: At least 4 punctate infarct at the watershed area including left MCA/PCA, bilateral MCA/ACA, etiology unclear. Could be due to hypotension vs. Endocarditis (low grade fever, recent spleen infarct, elevated WBC) vs. afib (was told to have irregular heart beat 20 years ago but not sure if afib) vs. Hypercoagulable state. However, pt does have positive strep vividan bacteremia, splenic infarction as well as embolic stroke, endocarditis is the main concern.  Noted that there was plan for outpt TEE at last discharge  TEE  pending  CTA head and neck showed no mycotic aneurysm  Blood culture 2/2 strep vividan positive  Hypercoagulable panel - pending  Avoid low BP  Bacteremia with ? 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   On Xarelto for 6 months, but on hold now due to [facility]  Hypertension  Stable mildly low blood pressures  Avoid hypotension  Hyperlipidemia  Home meds:  Lovastatin 20 mg daily resumed in hospital  LDL 107, goal < 70  On pravastatin 20mg  now  Continue statin at discharge  Diabetes  HgbA1c 8.4, goal < 7.0  Uncontrolled  On lantus  SSI  Other Stroke Risk Factors  Advanced age  Cigarette smoker, quit smoking 3 years ago.  Morbid obesity, Body mass index is 45.5 kg/(m^2).   Hx stroke/TIA  Family hx stroke (father)  Migraines  PVD - aortic athrosclerosis  Other  Active Problems  Leukocytosis  Elevated creatinine 1.10  Mild hyponatremia  Pulmonary HTN  Hilar lymphoadenopathy   Hospital day # 3  Rosalin Hawking, MD PhD Stroke Neurology 02/04/2016 12:28 PM     To contact Stroke Continuity provider, please refer to [email or web address] After hours, contact General Neurology

## 2016-02-04 NOTE — Progress Notes (Signed)
    [facility] has been requested to perform a transesophageal echocardiogram on 02/04/2016 for SBE.  After careful review of history and examination, the risks and benefits of transesophageal echocardiogram have been explained including risks of esophageal damage, perforation (1:10,000 risk), bleeding, pharyngeal hematoma as well as other potential complications associated with conscious sedation including aspiration, arrhythmia, respiratory failure and death. Alternatives to treatment were discussed, questions were answered. Patient is willing to proceed.    60 yo female with strep viridan bacteremia had splenic infarct and embolic stroke. Suspicion for SBE is high. Vitals stable, SBP 120-140s. On IV abx. Hgb stable. Platelet 470. No obvious contraindication to TEE. Benefit and risk explained to both patient and husband who displayed clear understanding and agree to proceed.    Almyra Deforest, [place] 02/04/2016 5:58 PM

## 2016-02-04 NOTE — Consult Note (Signed)
[facility]    Date of Admission:  02/01/2016   Total days of antibiotics 3        Ceftriaxone 3/12>>              Reason for Consult: Strep viridans bacteremia    Referring Physician: Dr. Domenic Polite Primary Care Physician: Dr. York Ram  Principal Problem:   Bacteremia Active Problems:   Streptococcus viridans infection   HTN (hypertension)   COPD (chronic obstructive pulmonary disease) (HCC)   Chronic diastolic CHF (congestive heart failure) ([place])   Morbid obesity ([place])   Insulin dependent diabetes mellitus ([place])   Essential hypertension   Hepatic cirrhosis ([place])   Splenic infarct   Pulmonary hypertension assoc with unclear multi-factorial mechanisms ([place])   Subarachnoid hemorrhage (HCC)   Leukocytosis   Diarrhea   Hyponatremia   Subarachnoid bleed (HCC)   Stroke ([place])   Visual changes   Cerebrovascular accident (CVA) due to embolism of cerebral artery ([place])   . albuterol  2.5 mg Nebulization QHS  . antiseptic oral rinse  7 mL Mouth Rinse BID  . cefTRIAXone (ROCEPHIN)  IV  2 g Intravenous Q24H  . insulin aspart  0-9 Units Subcutaneous TID WC  . insulin glargine  5 Units Subcutaneous QHS  . pravastatin  20 mg Oral q1800    Recommendations: 1. Follow up TEE 2. Follow up repeat blood cultures 3. Continue ceftriaxone 2gm IV q24h   Assessment: Margaret [name] is a 60 year old female who has Strep viridans bacteremia with recent splenic infarction (February 2017) and evidence of embolic stroke seen on imaging that likely represent septic emboli.  She is also presenting with 2-3 months of increased malaise and fatigue, chills, and subjective fevers at home.  Per chart review, she weighed 131kg on 01/14/16 and weighs 124kg today. She had an elevated WBC at admission with fever to 100.7.  She has been started on ceftriaxone for bacteremia and presumed endocarditis with plan to have TEE tomorrow.  Repeat blood cultures have been  obtained.  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   HPI: Margaret [name] is a 60 y.o. female with history of HTN, HLD, COPD, asthma, diabetes mellitus, migraine headaches, and morbid obesity who presented on 02/01/16 with headache, vision changes, low-grade fevers, and nausea.  She had an MRI that showed at least 4 punctate infarcts at the watershed area including left MCA/PCA, bilateral MCA/ACA.  Two sets of blood cultures obtained at admission have grown Strep viridans.  Patient states that she has felt fatigued and weak for approximately 2-3 months.  She also reports having felt subjectively febrile with chills at intermittent times during this time.  She reports increasingly worsening shortness of breath and cough with exertion that she thought may be due to her underlying lung disease.  She was recently hospitalized in February 2017 after presenting with abdominal pain and found to have an acute splenic infarct without clear etiology or source of embolus based on TTE and lower extremity dopplers.  She was scheduled to have an outpatient TEE and discharged on rivaroxaban.  She was seen by hematology/oncology on 01/23/2016 who felt that splenic infarct was most likely secondary to splenic congestion from liver cirrhosis combined with peripheral vascular disease.  She was to continue anticoagulation for 6 months.  At that time of discharge, she felt  better in that her abdominal pain was improved but she continued to feel very rundown and tired.  Today, she reports feeling better than when admitted on 02/01/16.  She is currently without fever or chills, no abdominal pain, no headache, no chest pain, no SOB or cough.  She still feels fatigued but is getting better.  Her biggest complaint currently is pain in her feet.  She has no history of IV drug use.  She reports no history of prosthetic  heart valve, prosthetic joints, or other removable device.  She has upper teeth dentures that were placed 2-3 years ago due to poor dentition.  She has not had any tooth or mouth infection recently.   Review of Systems: Review of Systems  Constitutional: Positive for chills, malaise/fatigue and diaphoresis. Negative for fever.  Respiratory: Positive for shortness of breath. Negative for cough, sputum production and wheezing.   Cardiovascular: Negative for chest pain and orthopnea.  Gastrointestinal: Positive for nausea. Negative for vomiting, abdominal pain and diarrhea.  Musculoskeletal: Negative for back pain and joint pain.  Skin: Negative for rash.  Neurological: Positive for headaches.    Past Medical History  Diagnosis Date  . Hypertension   . Hyperlipidemia   . COPD (chronic obstructive pulmonary disease) ([place]) 04/20/12  . Asthma   . Type II diabetes mellitus ([place])   . Anemia   . H/O hiatal hernia   . Osteoarthritis   . Pneumonia 04/2012  . Migraines   . Panic attacks 04/20/12  . Morbid obesity ([place])   . Heart murmur     Social History  Substance Use Topics  . Smoking status: Former Smoker -- 0.20 packs/day for 40 years    Types: Cigarettes    Quit date: 04/20/2012  . Smokeless tobacco: Never Used     Comment: 6/5//13 "a pack of cigarettes would last me 1 - 1 1/2 wk"  . Alcohol Use: No    Family History  Problem Relation Age of Onset  . Allergies    . Hypertension    . Coronary artery disease Mother 71  . Stroke Father   . COPD Father   . Clotting disorder Maternal Grandmother     died from blood clot   Allergies  Allergen Reactions  . Codeine Hives, Itching and Other (See Comments)    "breathing problems"  . Latex Itching    OBJECTIVE: Blood pressure 143/67, pulse 82, temperature 97.7 F (36.5 C), temperature source Oral, resp. rate 18, height 5\' 5"  (1.651 m), weight 273 lb 6.4 oz (124.013 kg), SpO2 91 %.  Physical Exam  Constitutional: She is  oriented to person, place, and time and well-developed, well-nourished, and in no distress.  HENT:  Head: Normocephalic and atraumatic.  Mouth/Throat: No oropharyngeal exudate.  Dentures.  Eyes: Conjunctivae and EOM are normal.  No conjunctival hemorrhages  Neck: Normal range of motion. Neck supple.  Cardiovascular: Normal rate and regular rhythm.   Murmur (3/6 systolic murmur) heard. Pulmonary/Chest: Effort normal and breath sounds normal. She has no wheezes.  Abdominal: Soft. Bowel sounds are normal. There is no tenderness.  Musculoskeletal: She exhibits no edema.  Lymphadenopathy:    She has no cervical adenopathy.  Neurological: She is alert and oriented to person, place, and time. No cranial nerve deficit.  Skin: Skin is warm and dry.  No evidence of stigmata of IE such as splinter hemorrhage, Janeway lesion    Lab Results Lab Results  Component Value Date   WBC 15.5* 02/03/2016  HGB 12.3 02/03/2016   HCT 41.1 02/03/2016   MCV 89.5 02/03/2016   PLT 474* 02/03/2016    Lab Results  Component Value Date   CREATININE 0.92 02/03/2016   BUN 20 02/03/2016   NA 134* 02/03/2016   K 4.8 02/03/2016   CL 98* 02/03/2016   CO2 26 02/03/2016    Lab Results  Component Value Date   ALT 12* 01/31/2016   AST 18 01/31/2016   ALKPHOS 75 01/31/2016   BILITOT 0.5 01/31/2016     Microbiology: Recent Results (from the past 240 hour(s))  Culture, blood (Routine X 2) w Reflex to ID Panel     Status: None   Collection Time: 02/01/16  7:45 AM  Result Value Ref Range Status   Specimen Description BLOOD RIGHT ARM  Final   Special Requests BOTTLES DRAWN AEROBIC AND ANAEROBIC 10CC  Final   Culture  Setup Time   Final    GRAM POSITIVE COCCOBACILLUS IN BOTH AEROBIC AND ANAEROBIC BOTTLES CRITICAL RESULT CALLED TO, READ BACK BY AND VERIFIED WITH: C SOSA 02/02/16 @ 1014 M VESTAL    Culture   Final    VIRIDANS STREPTOCOCCUS SUSCEPTIBILITIES PERFORMED ON PREVIOUS CULTURE WITHIN THE LAST 5  DAYS.    Report Status 02/04/2016 FINAL  Final  Culture, blood (Routine X 2) w Reflex to ID Panel     Status: None   Collection Time: 02/01/16  8:00 AM  Result Value Ref Range Status   Specimen Description BLOOD LEFT HAND  Final   Special Requests BOTTLES DRAWN AEROBIC AND ANAEROBIC 10CC  Final   Culture  Setup Time   Final    GRAM POSITIVE COCCOBACILLUS IN BOTH AEROBIC AND ANAEROBIC BOTTLES CRITICAL RESULT CALLED TO, READ BACK BY AND VERIFIED WITH: C SOSA 02/02/16 @ 1014 M VESTAL    Culture VIRIDANS STREPTOCOCCUS  Final   Report Status 02/04/2016 FINAL  Final   Organism ID, Bacteria VIRIDANS STREPTOCOCCUS  Final      Susceptibility   Viridans streptococcus - MIC*    PENICILLIN <=0.06 SENSITIVE Sensitive     CEFTRIAXONE <=0.12 SENSITIVE Sensitive     ERYTHROMYCIN <=0.12 SENSITIVE Sensitive     LEVOFLOXACIN 0.5 SENSITIVE Sensitive     VANCOMYCIN 0.5 SENSITIVE Sensitive     * VIRIDANS STREPTOCOCCUS  MRSA PCR Screening     Status: None   Collection Time: 02/01/16 12:54 PM  Result Value Ref Range Status   MRSA by PCR NEGATIVE NEGATIVE Final    Comment:        The GeneXpert MRSA Assay (FDA approved for NASAL specimens only), is one component of a comprehensive MRSA colonization surveillance program. It is not intended to diagnose MRSA infection nor to guide or monitor treatment for MRSA infections.     Jule Ser, DO  02/04/2016, 2:36 PM   Addendum: I have seen and examined Margaret [name] and discussed her care with Dr. Juleen China. She probably has subacute bacterial endocarditis with viridans Streptococcus complicated by multiple systemic embolic events. I agree with continuing ceftriaxone pending repeat blood cultures and TEE.  Michel Bickers, MD [facility] [phone] pager   [phone] cell 02/04/2016, 4:00 PM

## 2016-02-04 NOTE — Procedures (Signed)
History: Margaret [name] is an 60 y.o. female patient with altered mental status. Routine inpatient EEG was performed for further evaluation.   Patient Active Problem List   Diagnosis Date Noted  . Streptococcus viridans infection 02/04/2016  . Bacteremia   . Cerebrovascular accident (CVA) due to embolism of cerebral artery ([place])   . Subarachnoid hemorrhage ([place]) 02/01/2016  . Leukocytosis 02/01/2016  . Diarrhea 02/01/2016  . Hyponatremia 02/01/2016  . Subarachnoid bleed ([place]) 02/01/2016  . Stroke ([place])   . Visual changes   . Pulmonary infiltrates 01/23/2016  . Pulmonary hypertension assoc with unclear multi-factorial mechanisms ([place]) 01/23/2016  . 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 Insulin dependent diabetes mellitus ([place]) 01/14/2016  . Essential hypertension 01/14/2016  . Hepatic cirrhosis ([place]) 01/14/2016  . Nausea & vomiting 01/14/2016  . Splenic infarct 01/14/2016  . Upper abdominal pain   . COPD (chronic obstructive pulmonary disease) ([place]) 04/27/2012  . Healthcare-associated pneumonia 04/27/2012  . Hemoptysis 04/27/2012  . Chronic diastolic CHF (congestive heart failure) ([place]) 04/27/2012  . Morbid obesity ([place]) 04/27/2012  . Hypoxia 04/27/2012  . Dyspnea on exertion 04/27/2012  . Smoker 04/27/2012  . HTN (hypertension) 04/20/2012  . DM (diabetes mellitus) ([place]) 04/20/2012     Current facility-administered medications:  .  acetaminophen (TYLENOL) tablet 650 mg, 650 mg, Oral, Q4H PRN, 650 mg at 02/03/16 2159 **OR** acetaminophen (TYLENOL) suppository 650 mg, 650 mg, Rectal, Q4H PRN, Toy Baker, MD .  albuterol (PROVENTIL) (2.5 MG/3ML) 0.083% nebulizer solution 2.5 mg, 2.5 mg, Nebulization, QHS, Toy Baker, MD, 2.5 mg at 02/04/16 2138 .  antiseptic oral rinse (CPC / CETYLPYRIDINIUM CHLORIDE 0.05%) solution 7 mL, 7 mL, Mouth Rinse, BID, Toy Baker, MD, 7 mL at 02/04/16 0938 .  cefTRIAXone (ROCEPHIN) 2 g in dextrose 5 % 50 mL IVPB, 2 g,  Intravenous, Q24H, Donalynn Furlong [place], RPH, 2 g at 02/04/16 1050 .  HYDROcodone-acetaminophen (NORCO/VICODIN) 5-325 MG per tablet 1 tablet, 1 tablet, Oral, Q4H PRN, Domenic Polite, MD, 1 tablet at 02/04/16 2204 .  hydrOXYzine (ATARAX/VISTARIL) tablet 25 mg, 25 mg, Oral, TID PRN, Velvet Bathe, MD, 25 mg at 02/03/16 1239 .  insulin aspart (novoLOG) injection 0-9 Units, 0-9 Units, Subcutaneous, TID WC, Velvet Bathe, MD, 2 Units at 02/04/16 1845 .  insulin glargine (LANTUS) injection 5 Units, 5 Units, Subcutaneous, QHS, Toy Baker, MD, 5 Units at 02/04/16 2150 .  pravastatin (PRAVACHOL) tablet 20 mg, 20 mg, Oral, q1800, Toy Baker, MD, 20 mg at 02/04/16 1739 .  traMADol (ULTRAM) tablet 50 mg, 50 mg, Oral, Q6H PRN, Velvet Bathe, MD, 50 mg at 02/04/16 0801   Introduction:  This is a 19 channel routine scalp EEG performed at the bedside with bipolar and monopolar montages arranged in accordance to the international 10/20 system of electrode placement. One channel was dedicated to EKG recording.   Findings:  The best background rhythm was within normal limits of about 8.5-9 Hz alpha . No definite evidence of abnormal epileptiform discharges or electrographic seizures were noted during this recording.   Impression:  Unremarkable awake and drowsy routine inpatient EEG. Clinical correlation is recommended .

## 2016-02-05 ENCOUNTER — Inpatient Hospital Stay (HOSPITAL_COMMUNITY): Payer: Medicaid Other

## 2016-02-05 ENCOUNTER — Encounter (HOSPITAL_COMMUNITY): Payer: Self-pay

## 2016-02-05 ENCOUNTER — Encounter (HOSPITAL_COMMUNITY)
Admission: EM | Disposition: A | Discharge status: Home Health | Payer: Self-pay | Source: Home / Self Care | Attending: Pulmonary Disease

## 2016-02-05 DIAGNOSIS — I059 Rheumatic mitral valve disease, unspecified: Secondary | ICD-10-CM | POA: Diagnosis present

## 2016-02-05 DIAGNOSIS — R10816 Epigastric abdominal tenderness: Secondary | ICD-10-CM | POA: Diagnosis present

## 2016-02-05 DIAGNOSIS — D735 Infarction of spleen: Secondary | ICD-10-CM

## 2016-02-05 DIAGNOSIS — I340 Nonrheumatic mitral (valve) insufficiency: Secondary | ICD-10-CM

## 2016-02-05 DIAGNOSIS — R768 Other specified abnormal immunological findings in serum: Secondary | ICD-10-CM

## 2016-02-05 DIAGNOSIS — I058 Other rheumatic mitral valve diseases: Secondary | ICD-10-CM | POA: Diagnosis present

## 2016-02-05 HISTORY — PX: TEE WITHOUT CARDIOVERSION: SHX5443

## 2016-02-05 LAB — CBC WITH DIFFERENTIAL/PLATELET
BASOS ABS: 0 10*3/uL (ref 0.0–0.1)
BASOS PCT: 0 %
Eosinophils Absolute: 0.2 10*3/uL (ref 0.0–0.7)
Eosinophils Relative: 2 %
HEMATOCRIT: 41.5 % (ref 36.0–46.0)
HEMOGLOBIN: 12.9 g/dL (ref 12.0–15.0)
Lymphocytes Relative: 16 %
Lymphs Abs: 2.3 10*3/uL (ref 0.7–4.0)
MCH: 27.4 pg (ref 26.0–34.0)
MCHC: 31.1 g/dL (ref 30.0–36.0)
MCV: 88.3 fL (ref 78.0–100.0)
MONOS PCT: 6 %
Monocytes Absolute: 0.8 10*3/uL (ref 0.1–1.0)
NEUTROS ABS: 10.9 10*3/uL — AB (ref 1.7–7.7)
NEUTROS PCT: 76 %
Platelets: 398 10*3/uL (ref 150–400)
RBC: 4.7 MIL/uL (ref 3.87–5.11)
RDW: 15.1 % (ref 11.5–15.5)
WBC: 14.2 10*3/uL — ABNORMAL HIGH (ref 4.0–10.5)

## 2016-02-05 LAB — COMPREHENSIVE METABOLIC PANEL
ALBUMIN: 2.8 g/dL — AB (ref 3.5–5.0)
ALK PHOS: 76 U/L (ref 38–126)
ALT: 14 U/L (ref 14–54)
ANION GAP: 10 (ref 5–15)
AST: 15 U/L (ref 15–41)
BUN: 10 mg/dL (ref 6–20)
CALCIUM: 10.2 mg/dL (ref 8.9–10.3)
CO2: 25 mmol/L (ref 22–32)
Chloride: 99 mmol/L — ABNORMAL LOW (ref 101–111)
Creatinine, Ser: 0.91 mg/dL (ref 0.44–1.00)
GFR calc Af Amer: >60 mL/min (ref >60)
GFR calc non Af Amer: >60 mL/min (ref >60)
GLUCOSE: 204 mg/dL — AB (ref 65–99)
POTASSIUM: 4.3 mmol/L (ref 3.5–5.1)
SODIUM: 134 mmol/L — AB (ref 135–145)
TOTAL PROTEIN: 6.9 g/dL (ref 6.5–8.1)
Total Bilirubin: 0.7 mg/dL (ref 0.3–1.2)

## 2016-02-05 LAB — GLUCOSE, CAPILLARY
GLUCOSE-CAPILLARY: 172 mg/dL — AB (ref 65–99)
GLUCOSE-CAPILLARY: 159 mg/dL — AB (ref 65–99)
GLUCOSE-CAPILLARY: 145 mg/dL — AB (ref 65–99)
Glucose-Capillary: 201 mg/dL — ABNORMAL HIGH (ref 65–99)
Glucose-Capillary: 147 mg/dL — ABNORMAL HIGH (ref 65–99)

## 2016-02-05 LAB — TROPONIN I
TROPONIN I: 0.04 ng/mL — AB (ref <0.031)
TROPONIN I: 0.05 ng/mL — AB (ref <0.031)
Troponin I: 0.04 ng/mL — ABNORMAL HIGH (ref <0.031)

## 2016-02-05 LAB — HEPATITIS C ANTIBODY

## 2016-02-05 LAB — LIPASE, BLOOD: Lipase: 76 U/L — ABNORMAL HIGH (ref 11–51)

## 2016-02-05 SURGERY — ECHOCARDIOGRAM, TRANSESOPHAGEAL
Anesthesia: Moderate Sedation

## 2016-02-05 MED ORDER — MORPHINE SULFATE (PF) 2 MG/ML IV SOLN
2.0000 mg | INTRAVENOUS | Status: AC | PRN
  Administered 2016-02-05 (×3): 2 mg via INTRAVENOUS
  Filled 2016-02-05 (×2): qty 1

## 2016-02-05 MED ORDER — MORPHINE SULFATE (PF) 2 MG/ML IV SOLN
2.0000 mg | Freq: Once | INTRAVENOUS | Status: AC
  Administered 2016-02-05: 2 mg via INTRAVENOUS
  Filled 2016-02-05: qty 1

## 2016-02-05 MED ORDER — MIDAZOLAM HCL 10 MG/2ML IJ SOLN
INTRAMUSCULAR | Status: DC | PRN
  Administered 2016-02-05: 2 mg via INTRAVENOUS

## 2016-02-05 MED ORDER — SODIUM CHLORIDE 0.9% FLUSH: 3.0000 mL | INTRAVENOUS | Status: DC | PRN

## 2016-02-05 MED ORDER — IOHEXOL 300 MG/ML  SOLN
100.0000 mL | Freq: Once | INTRAMUSCULAR | Status: AC | PRN
  Administered 2016-02-05: 100 mL via INTRAVENOUS

## 2016-02-05 MED ORDER — FENTANYL CITRATE (PF) 100 MCG/2ML IJ SOLN
INTRAMUSCULAR | Status: AC
  Filled 2016-02-05: qty 2

## 2016-02-05 MED ORDER — MIDAZOLAM HCL 5 MG/ML IJ SOLN
INTRAMUSCULAR | Status: AC
  Filled 2016-02-05: qty 2

## 2016-02-05 MED ORDER — FENTANYL CITRATE (PF) 100 MCG/2ML IJ SOLN
INTRAMUSCULAR | Status: DC | PRN
  Administered 2016-02-05: 25 ug via INTRAVENOUS

## 2016-02-05 MED ORDER — BUTAMBEN-TETRACAINE-BENZOCAINE 2-2-14 % EX AERO
INHALATION_SPRAY | CUTANEOUS | Status: DC | PRN
  Administered 2016-02-05: 2 via TOPICAL

## 2016-02-05 MED ORDER — SODIUM CHLORIDE 0.9 % IV SOLN: INTRAVENOUS | Status: DC

## 2016-02-05 MED ORDER — MORPHINE SULFATE (PF) 2 MG/ML IV SOLN
INTRAVENOUS | Status: AC
  Filled 2016-02-05: qty 1

## 2016-02-05 MED ORDER — SODIUM CHLORIDE 0.9% FLUSH
3.0000 mL | Freq: Two times a day (BID) | INTRAVENOUS | Status: DC
  Administered 2016-02-05 – 2016-02-13 (×8): 3 mL via INTRAVENOUS

## 2016-02-05 MED ORDER — ONDANSETRON HCL 4 MG/2ML IJ SOLN
4.0000 mg | Freq: Four times a day (QID) | INTRAMUSCULAR | Status: DC | PRN
  Administered 2016-02-05 (×2): 4 mg via INTRAVENOUS
  Filled 2016-02-05 (×2): qty 2

## 2016-02-05 NOTE — Progress Notes (Addendum)
RN, Larwance Rote, paged because pt awoke with 10/10 epigastric pain. Pt states it "feels just like it did when I had the splenic infarct". Morphine given and NP to bedside. S: Epigastric pain awakened her at 8/10, quickly rising to a 10/10. Easing off to about a 8/10 now. Feels like stabbing pain. + nausea. No vomiting. No chest pain or SOB. No jaw or arm pain. One loose stool today, but no diarrhea. No melana or BRBPR. No hx pancreatitis.  O: Morbidly obese WF in moderate distress, holding upper abdomen and grimacing in pain. Not toxic appearing. Alert and oriented. Abd-obese, slightly taunt, hypoactive BS, tender to palpation in epigastric area. Mildly tender LUQ. No tenderness elsewhere. Skin dry.  A/P: 1. Acute abdominal/epigastric pain-check lipase, CMP, CBC with diff stat. Morphine 4mg  total and prn. If not better, consider CT abd. 12 lead EKG, cycle troponins.  2. Hx splenic infarct-was on anticoagulation but on hold secondary to [facility]. Old CT showed splenic congestion associated with liver cirrhosis. Check LFTs.  3. Bacteremia-ID following. For TEE in am. NPO now.  Clance Boll, NP [facility] Update: Pain has eased off slightly but still acutely significant. Lipase slightly elevated at 76. LFTs normal. CBC with decreasing WBCC. EKG without acute changes. 1st trop .04. Check CT abd.  KJKG, NP [facility]

## 2016-02-05 NOTE — H&P (View-Only) (Signed)
[facility] PROGRESS NOTE  [name] NWG:[id number] DOB: 05/18/1956 DOA: 02/01/2016 PCP: Jearld Lesch, MD   Subjective: Had 10/10 LUQ abdominal pain earlier today, repeated abdominal CT showed new splenic infarct.  HPI: Margaret [name] is a 60 y.o. female with Obesity, COPD, DM, HTN, admitted with punctate CVAs and SAH, she was recently admitted to [facility] with Splenic infarct and discharged home on xarelto, normal TEE then. Now with SAH and infarcts, Neuro consulting, Xarelto stopped Blood Cx now with Strep viridans, suspect IE, TEE tomorrow , ID consulted  Assessment/Plan:  CVA -MRI w/small SAH, punctate infarcts at the watershed area -Xarelto stopped -MRA - negative -CTA head and neck - no cerebral venous thrombosis, AVM, or aneurysm. 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  DM -hbaic 8.4 -continue lantus, SSI  HTN -stable  Dyslipidemia -On pravastatin  Diarrhea -Resolved  Morbid obesity  DVT proph: SCDs  Code Status: Full COde Family Communication: none at bedside Disposition Plan: home pending workup   Consultants: Neuro ID  Objective: Filed Vitals:   02/05/16 0516 02/05/16 1222  BP: 118/60 151/68  Pulse: 86 88  Temp: 98.2 F (36.8 C)   Resp: 18 12    Intake/Output Summary (Last 24 hours) at 02/05/16 1338 Last data filed at 02/05/16 0915  Gross per  24 hour  Intake    540 ml  Output   1700 ml  Net  -1160 ml   Filed Weights   02/01/16 1200 02/02/16 1904  Weight: 125.4 kg (276 lb 7.3 oz) 124.013 kg (273 lb 6.4 oz)    Exam:   General:AAOx3, morbidly obese, no distress but uncomfortable  Cardiovascular: S1S2/RRR, systolic murmur  Respiratory: diminished BS at bases  Abdomen: soft, Nt, BS present  Musculoskeletal: no edema, tenderness around both toes  Data Reviewed: Basic Metabolic Panel:  Recent Labs Lab 01/31/16 2232 01/31/16 2233 02/03/16 0032 02/05/16 0322  NA 131* 132* 134* 134*  K 4.9 4.6 4.8 4.3  CL 94* 94* 98* 99*  CO2 26  --  26 25  GLUCOSE 215* 211* 198* 204*  BUN 13 15 20 10   CREATININE 1.13* 1.10* 0.92 0.91  CALCIUM 10.2  --  11.0* 10.2   Liver Function Tests:  Recent Labs Lab 01/31/16 2232 02/05/16 0322  AST 18 15  ALT 12* 14  ALKPHOS 75 76  BILITOT 0.5 0.7  PROT 7.2 6.9  ALBUMIN 3.1* 2.8*    Recent Labs Lab 02/05/16 0322  LIPASE 76*   No results for input(s): AMMONIA in the last 168 hours. CBC:  Recent Labs Lab 01/31/16 2232 01/31/16 2233 02/03/16 1110 02/05/16 0322  WBC 17.9*  --  15.5* 14.2*  NEUTROABS 15.2*  --   --  10.9*  HGB 13.0 14.6 12.3 12.9  HCT 40.9 43.0 41.1 41.5  MCV 87.8  --  89.5 88.3  PLT 478*  --  474* 398   Cardiac Enzymes:  Recent  Labs Lab 02/01/16 0745 02/01/16 1442 02/01/16 1943 02/05/16 0322 02/05/16 0802  TROPONINI 0.04* 0.03 0.05* 0.04* 0.04*   BNP (last 3 results) No results for input(s): BNP in the last 8760 hours.  ProBNP (last 3 results) No results for input(s): PROBNP in the last 8760 hours.  CBG:  Recent Labs Lab 02/04/16 1237 02/04/16 1758 02/04/16 2132 02/05/16 0750 02/05/16 1325  GLUCAP 160* 171* 168* 201* 172*    Recent Results (from the past 240 hour(s))  Culture, blood (Routine X 2) w Reflex to ID Panel     Status: None   Collection Time: 02/01/16  7:45 AM  Result Value Ref Range Status   Specimen Description  BLOOD RIGHT ARM  Final   Special Requests BOTTLES DRAWN AEROBIC AND ANAEROBIC 10CC  Final   Culture  Setup Time   Final    GRAM POSITIVE COCCOBACILLUS IN BOTH AEROBIC AND ANAEROBIC BOTTLES CRITICAL RESULT CALLED TO, READ BACK BY AND VERIFIED WITH: C SOSA 02/02/16 @ 1014 M VESTAL    Culture   Final    VIRIDANS STREPTOCOCCUS SUSCEPTIBILITIES PERFORMED ON PREVIOUS CULTURE WITHIN THE LAST 5 DAYS.    Report Status 02/04/2016 FINAL  Final  Culture, blood (Routine X 2) w Reflex to ID Panel     Status: None   Collection Time: 02/01/16  8:00 AM  Result Value Ref Range Status   Specimen Description BLOOD LEFT HAND  Final   Special Requests BOTTLES DRAWN AEROBIC AND ANAEROBIC 10CC  Final   Culture  Setup Time   Final    GRAM POSITIVE COCCOBACILLUS IN BOTH AEROBIC AND ANAEROBIC BOTTLES CRITICAL RESULT CALLED TO, READ BACK BY AND VERIFIED WITH: C SOSA 02/02/16 @ 1014 M VESTAL    Culture VIRIDANS STREPTOCOCCUS  Final   Report Status 02/04/2016 FINAL  Final   Organism ID, Bacteria VIRIDANS STREPTOCOCCUS  Final      Susceptibility   Viridans streptococcus - MIC*    PENICILLIN <=0.06 SENSITIVE Sensitive     CEFTRIAXONE <=0.12 SENSITIVE Sensitive     ERYTHROMYCIN <=0.12 SENSITIVE Sensitive     LEVOFLOXACIN 0.5 SENSITIVE Sensitive     VANCOMYCIN 0.5 SENSITIVE Sensitive     * VIRIDANS STREPTOCOCCUS  MRSA PCR Screening     Status: None   Collection Time: 02/01/16 12:54 PM  Result Value Ref Range Status   MRSA by PCR NEGATIVE NEGATIVE Final    Comment:        The GeneXpert MRSA Assay (FDA approved for NASAL specimens only), is one component of a comprehensive MRSA colonization surveillance program. It is not intended to diagnose MRSA infection nor to guide or monitor treatment for MRSA infections.   Culture, blood (routine x 2)     Status: None (Preliminary result)   Collection Time: 02/04/16 12:36 PM  Result Value Ref Range Status   Specimen Description BLOOD  Final   Special Requests IN  PEDIATRIC BOTTLE 3CC  Final   Culture NO GROWTH < 24 HOURS  Final   Report Status PENDING  Incomplete  Culture, blood (routine x 2)     Status: None (Preliminary result)   Collection Time: 02/04/16 12:48 PM  Result Value Ref Range Status   Specimen Description BLOOD  Final   Special Requests IN PEDIATRIC BOTTLE 3CC  Final   Culture NO GROWTH < 24 HOURS  Final   Report Status PENDING  Incomplete     Studies: Ct Abdomen W Contrast  02/05/2016  CLINICAL DATA:  60 year old female  inpatient with epigastric/left abdominal pain. Splenic infarction on recent CT. EXAM: CT ABDOMEN WITH CONTRAST TECHNIQUE: Multidetector CT imaging of the abdomen was performed using the standard protocol following bolus administration of intravenous contrast. CONTRAST:  100 cc Omnipaque 300 IV. COMPARISON:  01/14/2016 CT abdomen/ pelvis. FINDINGS: Lower chest: No significant pulmonary nodules or acute consolidative airspace disease. There is stable mosaic attenuation at the lung bases. Stable trace layering right pleural effusion. Hepatobiliary: Diffuse hepatic steatosis. No liver mass. No definite liver surface irregularity. Normal gallbladder with no radiopaque cholelithiasis. No biliary ductal dilatation. Pancreas: Normal, with no mass or duct dilation. Spleen: There is new mild splenomegaly (craniocaudal splenic length 14.9 cm, increased from 12.6 cm on 02/29/2017). There is been continued evolution of the previously described splenic infarct involving the upper 50% of the spleen, which is now all fluid density and distended. There is a new large splenic infarct in the lower 50% of the spleen. Only approximately 25% of the splenic parenchyma remains perfused. Splenic artery and celiac trunk appear patent. Adrenals/Urinary Tract: Normal adrenals. Simple 2.1 cm anterior upper and 1.5 cm lower left renal cysts. No hydronephrosis. No new renal lesions. Stomach/Bowel: Grossly normal stomach. Visualized small and large bowel is  normal caliber, with no bowel wall thickening. Vascular/Lymphatic: Atherosclerotic nonaneurysmal abdominal aorta. Patent portal, splenic, hepatic and renal veins. No pathologically enlarged lymph nodes in the abdomen. Other: No pneumoperitoneum, ascites or focal fluid collection. Musculoskeletal: Re- demonstrated are endplate erosions at T8-9 (series 6/ image 125), not appreciably changed since 01/14/2016, although new since 06/18/2012. Moderate degenerative changes in the visualized thoracolumbar spine. IMPRESSION: 1. 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 Evolution of the previously described splenic infarct involving the upper 50% of the spleen. Spleen is now mildly enlarged. Cardiac embolic source remains the most likely etiology. Splenic artery and vein appear patent. 2. Stable endplate erosions at T8-9, new since 2013, nonspecific. Consider further evaluation with thoracic spine MRI. 3. Diffuse hepatic steatosis. 4. Mosaic attenuation at the lung bases, favor mosaic perfusion due to chronic pulmonary arterial hypertension. Electronically Signed   By: Delbert Phenix M.D.   On: 02/05/2016 08:16    Scheduled Meds: . [MAR Hold] albuterol  2.5 mg Nebulization QHS  . [MAR Hold] antiseptic oral rinse  7 mL Mouth Rinse BID  . [MAR Hold] cefTRIAXone (ROCEPHIN)  IV  2 g Intravenous Q24H  . [MAR Hold] insulin aspart  0-9 Units Subcutaneous TID WC  . [MAR Hold] insulin glargine  5 Units Subcutaneous QHS  . [MAR Hold] pravastatin  20 mg Oral q1800   Continuous Infusions: . sodium chloride     Antibiotics Given (last 72 hours)    Date/Time Action Medication Dose Rate   02/03/16 1123 Given   cefTRIAXone (ROCEPHIN) 1 g in dextrose 5 % 50 mL IVPB 1 g 100 mL/hr   02/03/16 1242 Given   cefTRIAXone (ROCEPHIN) 1 g in dextrose 5 % 50 mL IVPB 1 g 100 mL/hr   02/04/16 1050 Given   [MAR Hold] cefTRIAXone (ROCEPHIN) 2 g in dextrose 5 % 50 mL IVPB (MAR Hold since 02/05/16 1221)  2 g 100 mL/hr      Principal Problem:   Bacteremia Active Problems:   HTN (hypertension)   COPD (chronic obstructive pulmonary disease) (HCC)   Chronic diastolic CHF (congestive heart failure) (HCC)   Morbid obesity (HCC)   Insulin dependent diabetes mellitus (HCC)   Essential hypertension   Hepatic cirrhosis (HCC)  Splenic infarct   Pulmonary hypertension assoc with unclear multi-factorial mechanisms (HCC)   Subarachnoid hemorrhage (HCC)   Leukocytosis   Diarrhea   Hyponatremia   Subarachnoid bleed (HCC)   Stroke (HCC)   Visual changes   Cerebrovascular accident (CVA) due to embolism of cerebral artery (HCC)   Streptococcus viridans infection    Time spent:    [facility]  [facility] Pager [phone]. If 7PM-7AM, please contact night-coverage at [email or web address], password [facility] 02/05/2016, 1:38 PM  LOS: 4 days

## 2016-02-05 NOTE — Progress Notes (Signed)
OT Cancellation Note  Patient Details Name: LYNA ODEA MRN: [id number] DOB: 08/21/1956   Cancelled Treatment:    Reason Eval/Treat Not Completed: Patient at procedure or test/ unavailable (Pt at TEE). Attempted to see while pt at TEE. Will follow up with pt tomorrow.  [place]upe, OTR/L  [id number] 02/05/2016 02/05/2016, 3:24 PM

## 2016-02-05 NOTE — Progress Notes (Signed)
Pt woke with some epigastric pain 10/10 and nauseated. Paged Dr. Baltazar Najjar. No new orders at this time. Will continue to monitor

## 2016-02-05 NOTE — Progress Notes (Addendum)
Spoke with Dr.Kirby ab=nd explained to her that ptt states that it feels exactly like it did when she had the splenic infarct. Pt awoke from sleep experiencing this pain. She is moaning, rocking, and grimacing. Dr.Kirby states that she will be up shortly to assess the pt. Will continue to monitor  0240Baltazar Najjar, NP, gave a verbal order to administer 2 more mg of morphine  IV to make 4 mg total. Will implement and continue to monitor

## 2016-02-05 NOTE — Interval H&P Note (Signed)
History and Physical Interval Note:  02/05/2016 1:45 PM  [name]  has presented today for surgery, with the diagnosis of STROKE  The various methods of treatment have been discussed with the patient and family. After consideration of risks, benefits and other options for treatment, the patient has consented to  Procedure(s): TRANSESOPHAGEAL ECHOCARDIOGRAM (TEE) (N/A) as a surgical intervention .  [name] history has been reviewed, patient examined, no change in status, stable for surgery.  I have reviewed the patient's chart and labs.  Questions were answered to the patient's satisfaction.     Sharlie Shreffler [employer]

## 2016-02-05 NOTE — Progress Notes (Signed)
[place] [facility]    Date of Admission:  02/01/2016   Total days of antibiotics 3 Ceftriaxone 3/12>>  Principal Problem:   Bacteremia Active Problems:   Streptococcus viridans infection   HTN (hypertension)   COPD (chronic obstructive pulmonary disease) (HCC)   Chronic diastolic CHF (congestive heart failure) (HCC)   Morbid obesity (HCC)   Insulin dependent diabetes mellitus ([place])   Essential hypertension   Hepatic cirrhosis (HCC)   Splenic infarct   Pulmonary hypertension assoc with unclear multi-factorial mechanisms (HCC)   Subarachnoid hemorrhage (HCC)   Leukocytosis   Diarrhea   Hyponatremia   Subarachnoid bleed (HCC)   Stroke (HCC)   Visual changes   Cerebrovascular accident (CVA) due to embolism of cerebral artery ([place])   . albuterol  2.5 mg Nebulization QHS  . antiseptic oral rinse  7 mL Mouth Rinse BID  . cefTRIAXone (ROCEPHIN)  IV  2 g Intravenous Q24H  . insulin aspart  0-9 Units Subcutaneous TID WC  . insulin glargine  5 Units Subcutaneous QHS  . pravastatin  20 mg Oral q1800    SUBJECTIVE: Margaret [name] is lying in bed on her left side.  She states she is still having abdominal pain that began last night but it has eased off some with pain medications.  States the pain was similar to what she experienced last month when hospitalized for splenic infarction.  She states she is trying to stay awake but pain medicines are making her sleepy.  She has her oxygen on because she felt a little short of breath last night with the pain.  Review of Systems: Review of Systems  Constitutional: Negative for fever and chills.  Respiratory: Positive for shortness of breath. Negative for cough.   Cardiovascular: Negative for chest pain.  Gastrointestinal: Positive for nausea and abdominal pain. Negative for vomiting, diarrhea and constipation.    Musculoskeletal: Negative for back pain.    Past Medical History  Diagnosis Date  . Hypertension   . Hyperlipidemia   . COPD (chronic obstructive pulmonary disease) ([place]) 04/20/12  . Asthma   . Type II diabetes mellitus ([place])   . Anemia   . H/O hiatal hernia   . Osteoarthritis   . Pneumonia 04/2012  . Migraines   . Panic attacks 04/20/12  . Morbid obesity ([place])   . Heart murmur     Social History  Substance Use Topics  . Smoking status: Former Smoker -- 0.20 packs/day for 40 years    Types: Cigarettes    Quit date: 04/20/2012  . Smokeless tobacco: Never Used     Comment: 6/5//13 "a pack of cigarettes would last me 1 - 1 1/2 wk"  . Alcohol Use: No    Family History  Problem Relation Age of Onset  . Allergies    . Hypertension    . Coronary artery disease Mother 58  . Stroke Father   . COPD Father   . Clotting disorder Maternal Grandmother     died from blood clot   Allergies  Allergen Reactions  . Codeine Hives, Itching and Other (See Comments)    "breathing problems"  . Latex Itching    OBJECTIVE: Filed Vitals:   02/04/16 2140 02/05/16 0000 02/05/16 0306 02/05/16 0516  BP:  138/65 160/75 118/60  Pulse:  90 83 86  Temp:  99.1 F (37.3 C) 98.2 F (36.8 C) 98.2 F (36.8 C)  TempSrc:  Oral Oral Oral  Resp:  19 21 18   Height:      Weight:      SpO2: 92% 94% 90% 94%   Body mass index is 45.5 kg/(m^2).  Physical Exam  Constitutional: She is oriented to person, place, and time.  Obese female, lying in bed on left side, somnolent  HENT:  Head: Normocephalic and atraumatic.  Dentures in place.  Eyes: Conjunctivae and EOM are normal.  Neck: Normal range of motion.  Cardiovascular: Normal rate and regular rhythm.   Murmur heard. 3/6 systolic murmur unchanged  Pulmonary/Chest: Effort normal and breath sounds normal.  Receiving supplemental O2 via nasal cannula  Abdominal: Soft. Bowel sounds are normal. There is tenderness (LUQ ). There is no rebound and  no guarding.  Musculoskeletal: She exhibits no edema.  Neurological: She is alert and oriented to person, place, and time.  Skin: Skin is warm and dry.    Lab Results Lab Results  Component Value Date   WBC 14.2* 02/05/2016   HGB 12.9 02/05/2016   HCT 41.5 02/05/2016   MCV 88.3 02/05/2016   PLT 398 02/05/2016    Lab Results  Component Value Date   CREATININE 0.91 02/05/2016   BUN 10 02/05/2016   NA 134* 02/05/2016   K 4.3 02/05/2016   CL 99* 02/05/2016   CO2 25 02/05/2016    Lab Results  Component Value Date   ALT 14 02/05/2016   AST 15 02/05/2016   ALKPHOS 76 02/05/2016   BILITOT 0.7 02/05/2016     Microbiology: Recent Results (from the past 240 hour(s))  Culture, blood (Routine X 2) w Reflex to ID Panel     Status: None   Collection Time: 02/01/16  7:45 AM  Result Value Ref Range Status   Specimen Description BLOOD RIGHT ARM  Final   Special Requests BOTTLES DRAWN AEROBIC AND ANAEROBIC 10CC  Final   Culture  Setup Time   Final    GRAM POSITIVE COCCOBACILLUS IN BOTH AEROBIC AND ANAEROBIC BOTTLES CRITICAL RESULT CALLED TO, READ BACK BY AND VERIFIED WITH: C SOSA 02/02/16 @ 1014 M VESTAL    Culture   Final    VIRIDANS STREPTOCOCCUS SUSCEPTIBILITIES PERFORMED ON PREVIOUS CULTURE WITHIN THE LAST 5 DAYS.    Report Status 02/04/2016 FINAL  Final  Culture, blood (Routine X 2) w Reflex to ID Panel     Status: None   Collection Time: 02/01/16  8:00 AM  Result Value Ref Range Status   Specimen Description BLOOD LEFT HAND  Final   Special Requests BOTTLES DRAWN AEROBIC AND ANAEROBIC 10CC  Final   Culture  Setup Time   Final    GRAM POSITIVE COCCOBACILLUS IN BOTH AEROBIC AND ANAEROBIC BOTTLES CRITICAL RESULT CALLED TO, READ BACK BY AND VERIFIED WITH: C SOSA 02/02/16 @ 1014 M VESTAL    Culture VIRIDANS STREPTOCOCCUS  Final   Report Status 02/04/2016 FINAL  Final   Organism ID, Bacteria VIRIDANS STREPTOCOCCUS  Final      Susceptibility   Viridans streptococcus - MIC*     PENICILLIN <=0.06 SENSITIVE Sensitive     CEFTRIAXONE <=0.12 SENSITIVE Sensitive     ERYTHROMYCIN <=0.12 SENSITIVE Sensitive     LEVOFLOXACIN 0.5 SENSITIVE Sensitive     VANCOMYCIN 0.5 SENSITIVE Sensitive     * VIRIDANS STREPTOCOCCUS  MRSA PCR Screening     Status: None   Collection Time: 02/01/16 12:54 PM  Result Value Ref Range Status   MRSA by PCR NEGATIVE NEGATIVE Final    Comment:  The GeneXpert MRSA Assay (FDA approved for NASAL specimens only), is one component of a comprehensive MRSA colonization surveillance program. It is not intended to diagnose MRSA infection nor to guide or monitor treatment for MRSA infections.      ASSESSMENT: 60 year old female with Strep viridans bacteremia and recent splenic infarction (February 2017) and evidence of embolic stroke seen on imaging that likely represent septic emboli. She is also presenting with 2-3 months of increased malaise and fatigue, chills, and subjective fevers at home. 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  She had acute onset abdominal pain last night with CT scan showing a new large splenic infarct in the lower 50% of the spleen that was not present on 01/14/16.  The splenic artery and vein appeared patent.  She is scheduled to go for TEE today.  PLAN: 1. TEE today 2. Follow repeat blood cultures 3. Continue ceftriaxone pending repeat cultures for likely minimum 6 week course  Jule Ser, DO   02/05/2016, 10:26 AM   Addendum: I have seen and examined Margaret [name] and agree with Dr. Alcario Drought assessment and plan. Margaret [name] is more uncomfortable today after probably having another embolic event to her spleen. She has subacute infection with viridans streptococcal bacteremia and almost certainly has endocarditis. TEE is planned for today. Repeat blood cultures are  negative at 24 hours. We will continue ceftriaxone.  Michel Bickers, MD [facility] [phone] pager   [phone] cell 02/05/2016, 1:41 PM

## 2016-02-05 NOTE — CV Procedure (Signed)
Procedure: TEE  Indication: Endocarditis  Sedation: Versed 2 mg IV, Fentanyl 25 mcg IV  Findings: Please see echo section for full report.  Normal LV size with EF 60-65%.  Normal wall motion.  Normal RV size and systolic function.  Trivial TR, no TV vegetation.  No PV vegetation.  Trileaflet aortic valve with no stenosis or regurgitation, no vegetation.  There was a mobile vegetation on the posterior leaflet of the mitral valve measuring about 2 cm in greatest dimension.  The posterior and anterior leaflets of the MV did not coapt properly and there was very eccentric, anteriorly-directed mitral regurgitation.  I suspect severe MR.  Jet was too eccentric for PISA calculations. There was systolic flattening but not flow reversal in the pulmonary vein doppler pattern.  The left atrium was mildly dilated, no LA appendage thrombus.  Normal right atrium.  No evidence for PFO or ASD.  Normal caliber aorta with mild plaque in the descending thoracic aorta.   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   Loralie Champagne 02/05/2016 2:06 PM

## 2016-02-05 NOTE — Progress Notes (Signed)
[facility] PROGRESS NOTE  Margaret [name] NWG:[id number] DOB: 05/18/1956 DOA: 02/01/2016 PCP: Jearld Lesch, MD   Subjective: Had 10/10 LUQ abdominal pain earlier today, repeated abdominal CT showed new splenic infarct.  HPI: Margaret [name] is a 60 y.o. female with Obesity, COPD, DM, HTN, admitted with punctate CVAs and SAH, she was recently admitted to [facility] with Splenic infarct and discharged home on xarelto, normal TEE then. Now with SAH and infarcts, Neuro consulting, Xarelto stopped Blood Cx now with Strep viridans, suspect IE, TEE tomorrow , ID consulted  Assessment/Plan:  CVA -MRI w/small SAH, punctate infarcts at the watershed area -Xarelto stopped -MRA - negative -CTA head and neck - no cerebral venous thrombosis, AVM, or aneurysm. 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  DM -hbaic 8.4 -continue lantus, SSI  HTN -stable  Dyslipidemia -On pravastatin  Diarrhea -Resolved  Morbid obesity  DVT proph: SCDs  Code Status: Full COde Family Communication: none at bedside Disposition Plan: home pending workup   Consultants: Neuro ID  Objective: Filed Vitals:   02/05/16 0516 02/05/16 1222  BP: 118/60 151/68  Pulse: 86 88  Temp: 98.2 F (36.8 C)   Resp: 18 12    Intake/Output Summary (Last 24 hours) at 02/05/16 1338 Last data filed at 02/05/16 0915  Gross per  24 hour  Intake    540 ml  Output   1700 ml  Net  -1160 ml   Filed Weights   02/01/16 1200 02/02/16 1904  Weight: 125.4 kg (276 lb 7.3 oz) 124.013 kg (273 lb 6.4 oz)    Exam:   General:AAOx3, morbidly obese, no distress but uncomfortable  Cardiovascular: S1S2/RRR, systolic murmur  Respiratory: diminished BS at bases  Abdomen: soft, Nt, BS present  Musculoskeletal: no edema, tenderness around both toes  Data Reviewed: Basic Metabolic Panel:  Recent Labs Lab 01/31/16 2232 01/31/16 2233 02/03/16 0032 02/05/16 0322  NA 131* 132* 134* 134*  K 4.9 4.6 4.8 4.3  CL 94* 94* 98* 99*  CO2 26  --  26 25  GLUCOSE 215* 211* 198* 204*  BUN 13 15 20 10   CREATININE 1.13* 1.10* 0.92 0.91  CALCIUM 10.2  --  11.0* 10.2   Liver Function Tests:  Recent Labs Lab 01/31/16 2232 02/05/16 0322  AST 18 15  ALT 12* 14  ALKPHOS 75 76  BILITOT 0.5 0.7  PROT 7.2 6.9  ALBUMIN 3.1* 2.8*    Recent Labs Lab 02/05/16 0322  LIPASE 76*   No results for input(s): AMMONIA in the last 168 hours. CBC:  Recent Labs Lab 01/31/16 2232 01/31/16 2233 02/03/16 1110 02/05/16 0322  WBC 17.9*  --  15.5* 14.2*  NEUTROABS 15.2*  --   --  10.9*  HGB 13.0 14.6 12.3 12.9  HCT 40.9 43.0 41.1 41.5  MCV 87.8  --  89.5 88.3  PLT 478*  --  474* 398   Cardiac Enzymes:  Recent  Labs Lab 02/01/16 0745 02/01/16 1442 02/01/16 1943 02/05/16 0322 02/05/16 0802  TROPONINI 0.04* 0.03 0.05* 0.04* 0.04*   BNP (last 3 results) No results for input(s): BNP in the last 8760 hours.  ProBNP (last 3 results) No results for input(s): PROBNP in the last 8760 hours.  CBG:  Recent Labs Lab 02/04/16 1237 02/04/16 1758 02/04/16 2132 02/05/16 0750 02/05/16 1325  GLUCAP 160* 171* 168* 201* 172*    Recent Results (from the past 240 hour(s))  Culture, blood (Routine X 2) w Reflex to ID Panel     Status: None   Collection Time: 02/01/16  7:45 AM  Result Value Ref Range Status   Specimen Description  BLOOD RIGHT ARM  Final   Special Requests BOTTLES DRAWN AEROBIC AND ANAEROBIC 10CC  Final   Culture  Setup Time   Final    GRAM POSITIVE COCCOBACILLUS IN BOTH AEROBIC AND ANAEROBIC BOTTLES CRITICAL RESULT CALLED TO, READ BACK BY AND VERIFIED WITH: C SOSA 02/02/16 @ 1014 M VESTAL    Culture   Final    VIRIDANS STREPTOCOCCUS SUSCEPTIBILITIES PERFORMED ON PREVIOUS CULTURE WITHIN THE LAST 5 DAYS.    Report Status 02/04/2016 FINAL  Final  Culture, blood (Routine X 2) w Reflex to ID Panel     Status: None   Collection Time: 02/01/16  8:00 AM  Result Value Ref Range Status   Specimen Description BLOOD LEFT HAND  Final   Special Requests BOTTLES DRAWN AEROBIC AND ANAEROBIC 10CC  Final   Culture  Setup Time   Final    GRAM POSITIVE COCCOBACILLUS IN BOTH AEROBIC AND ANAEROBIC BOTTLES CRITICAL RESULT CALLED TO, READ BACK BY AND VERIFIED WITH: C SOSA 02/02/16 @ 1014 M VESTAL    Culture VIRIDANS STREPTOCOCCUS  Final   Report Status 02/04/2016 FINAL  Final   Organism ID, Bacteria VIRIDANS STREPTOCOCCUS  Final      Susceptibility   Viridans streptococcus - MIC*    PENICILLIN <=0.06 SENSITIVE Sensitive     CEFTRIAXONE <=0.12 SENSITIVE Sensitive     ERYTHROMYCIN <=0.12 SENSITIVE Sensitive     LEVOFLOXACIN 0.5 SENSITIVE Sensitive     VANCOMYCIN 0.5 SENSITIVE Sensitive     * VIRIDANS STREPTOCOCCUS  MRSA PCR Screening     Status: None   Collection Time: 02/01/16 12:54 PM  Result Value Ref Range Status   MRSA by PCR NEGATIVE NEGATIVE Final    Comment:        The GeneXpert MRSA Assay (FDA approved for NASAL specimens only), is one component of a comprehensive MRSA colonization surveillance program. It is not intended to diagnose MRSA infection nor to guide or monitor treatment for MRSA infections.   Culture, blood (routine x 2)     Status: None (Preliminary result)   Collection Time: 02/04/16 12:36 PM  Result Value Ref Range Status   Specimen Description BLOOD  Final   Special Requests IN  PEDIATRIC BOTTLE 3CC  Final   Culture NO GROWTH < 24 HOURS  Final   Report Status PENDING  Incomplete  Culture, blood (routine x 2)     Status: None (Preliminary result)   Collection Time: 02/04/16 12:48 PM  Result Value Ref Range Status   Specimen Description BLOOD  Final   Special Requests IN PEDIATRIC BOTTLE 3CC  Final   Culture NO GROWTH < 24 HOURS  Final   Report Status PENDING  Incomplete     Studies: Ct Abdomen W Contrast  02/05/2016  CLINICAL DATA:  60 year old female  inpatient with epigastric/left abdominal pain. Splenic infarction on recent CT. EXAM: CT ABDOMEN WITH CONTRAST TECHNIQUE: Multidetector CT imaging of the abdomen was performed using the standard protocol following bolus administration of intravenous contrast. CONTRAST:  100 cc Omnipaque 300 IV. COMPARISON:  01/14/2016 CT abdomen/ pelvis. FINDINGS: Lower chest: No significant pulmonary nodules or acute consolidative airspace disease. There is stable mosaic attenuation at the lung bases. Stable trace layering right pleural effusion. Hepatobiliary: Diffuse hepatic steatosis. No liver mass. No definite liver surface irregularity. Normal gallbladder with no radiopaque cholelithiasis. No biliary ductal dilatation. Pancreas: Normal, with no mass or duct dilation. Spleen: There is new mild splenomegaly (craniocaudal splenic length 14.9 cm, increased from 12.6 cm on 02/29/2017). There is been continued evolution of the previously described splenic infarct involving the upper 50% of the spleen, which is now all fluid density and distended. There is a new large splenic infarct in the lower 50% of the spleen. Only approximately 25% of the splenic parenchyma remains perfused. Splenic artery and celiac trunk appear patent. Adrenals/Urinary Tract: Normal adrenals. Simple 2.1 cm anterior upper and 1.5 cm lower left renal cysts. No hydronephrosis. No new renal lesions. Stomach/Bowel: Grossly normal stomach. Visualized small and large bowel is  normal caliber, with no bowel wall thickening. Vascular/Lymphatic: Atherosclerotic nonaneurysmal abdominal aorta. Patent portal, splenic, hepatic and renal veins. No pathologically enlarged lymph nodes in the abdomen. Other: No pneumoperitoneum, ascites or focal fluid collection. Musculoskeletal: Re- demonstrated are endplate erosions at T8-9 (series 6/ image 125), not appreciably changed since 01/14/2016, although new since 06/18/2012. Moderate degenerative changes in the visualized thoracolumbar spine. IMPRESSION: 1. 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 Evolution of the previously described splenic infarct involving the upper 50% of the spleen. Spleen is now mildly enlarged. Cardiac embolic source remains the most likely etiology. Splenic artery and vein appear patent. 2. Stable endplate erosions at T8-9, new since 2013, nonspecific. Consider further evaluation with thoracic spine MRI. 3. Diffuse hepatic steatosis. 4. Mosaic attenuation at the lung bases, favor mosaic perfusion due to chronic pulmonary arterial hypertension. Electronically Signed   By: Delbert Phenix M.D.   On: 02/05/2016 08:16    Scheduled Meds: . [MAR Hold] albuterol  2.5 mg Nebulization QHS  . [MAR Hold] antiseptic oral rinse  7 mL Mouth Rinse BID  . [MAR Hold] cefTRIAXone (ROCEPHIN)  IV  2 g Intravenous Q24H  . [MAR Hold] insulin aspart  0-9 Units Subcutaneous TID WC  . [MAR Hold] insulin glargine  5 Units Subcutaneous QHS  . [MAR Hold] pravastatin  20 mg Oral q1800   Continuous Infusions: . sodium chloride     Antibiotics Given (last 72 hours)    Date/Time Action Medication Dose Rate   02/03/16 1123 Given   cefTRIAXone (ROCEPHIN) 1 g in dextrose 5 % 50 mL IVPB 1 g 100 mL/hr   02/03/16 1242 Given   cefTRIAXone (ROCEPHIN) 1 g in dextrose 5 % 50 mL IVPB 1 g 100 mL/hr   02/04/16 1050 Given   [MAR Hold] cefTRIAXone (ROCEPHIN) 2 g in dextrose 5 % 50 mL IVPB (MAR Hold since 02/05/16 1221)  2 g 100 mL/hr      Principal Problem:   Bacteremia Active Problems:   HTN (hypertension)   COPD (chronic obstructive pulmonary disease) (HCC)   Chronic diastolic CHF (congestive heart failure) (HCC)   Morbid obesity (HCC)   Insulin dependent diabetes mellitus (HCC)   Essential hypertension   Hepatic cirrhosis (HCC)  Splenic infarct   Pulmonary hypertension assoc with unclear multi-factorial mechanisms (HCC)   Subarachnoid hemorrhage (HCC)   Leukocytosis   Diarrhea   Hyponatremia   Subarachnoid bleed (HCC)   Stroke (HCC)   Visual changes   Cerebrovascular accident (CVA) due to embolism of cerebral artery (HCC)   Streptococcus viridans infection    Time spent:    [facility]  [facility] Pager [phone]. If 7PM-7AM, please contact night-coverage at [email or web address], password [facility] 02/05/2016, 1:38 PM  LOS: 4 days

## 2016-02-05 NOTE — Progress Notes (Signed)
STROKE TEAM PROGRESS NOTE   SUBJECTIVE (INTERVAL HISTORY) Pt husband at bedside. 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 She is on rocephin and ID is on board. no complains over night, no acute issue. Afebrile. Repeat blood culture pending.   OBJECTIVE Temp:  [98 F (36.7 C)-99.1 F (37.3 C)] 98.4 F (36.9 C) (03/15 1544) Pulse Rate:  [83-91] 83 (03/15 1544) Cardiac Rhythm:  [-] Normal sinus rhythm (03/14 2140) Resp:  [12-27] 19 (03/15 1544) BP: (98-160)/(48-76) 121/75 mmHg (03/15 1544) SpO2:  [90 %-98 %] 95 % (03/15 1544)  CBC:   Recent Labs Lab 01/31/16 2232  02/03/16 1110 02/05/16 0322  WBC 17.9*  --  15.5* 14.2*  NEUTROABS 15.2*  --   --  10.9*  HGB 13.0  < > 12.3 12.9  HCT 40.9  < > 41.1 41.5  MCV 87.8  --  89.5 88.3  PLT 478*  --  474* 398  < > = values in this interval not displayed.  Basic Metabolic Panel:   Recent Labs Lab 02/03/16 0032 02/05/16 0322  NA 134* 134*  K 4.8 4.3  CL 98* 99*  CO2 26 25  GLUCOSE 198* 204*  BUN 20 10  CREATININE 0.92 0.91  CALCIUM 11.0* 10.2    Lipid Panel:     Component Value Date/Time   CHOL 155 02/02/2016 1510   TRIG 137 02/02/2016 1510   HDL 21* 02/02/2016 1510   CHOLHDL 7.4 02/02/2016 1510   VLDL 27 02/02/2016 1510   LDLCALC 107* 02/02/2016 1510   HgbA1c:  Lab Results  Component Value Date   HGBA1C 8.4* 02/02/2016   Urine Drug Screen: No results found for: LABOPIA, COCAINSCRNUR, LABBENZ, AMPHETMU, THCU, LABBARB    IMAGING I have personally reviewed the radiological images below and agree with the radiology interpretations.  Dg Chest 2 View 02/01/2016    Cardiomegaly and pulmonary venous congestion.   Ct Head Wo Contrast 02/01/2016   Localized subarachnoid hemorrhage/debris in the left parietal occipital region as discussed above. MRI/MRA recommended.   Mri and Mra Head Wo Contrast 02/01/2016   1. Small volume subarachnoid hemorrhage/debris  within the left parieto-occipital region, corresponding to abnormality seen on prior CT. Minimal gyral swelling within this region without significant mass effect or other abnormality.  2. Few sub cm cortical/subcortical ischemic infarcts within the bilateral parietal lobes as above.  3. Negative intracranial MRA.   CTA of head and neck 02/01/2016 1. Mild intracranial and extracranial atherosclerosis without significant stenosis. 2. Patent dural venous sinuses. No vascular malformation identified.  LE venous doppler - Bilateral: No evidence of DVT, superficial thrombosis, or Baker's Cyst.  EEG - Unremarkable awake and drowsy routine inpatient EEG. Clinical correlation is recommended .   TEE - Normal LV size with EF 60-65%. Normal wall motion. Normal RV size and systolic function. Trivial TR, no TV vegetation. No PV vegetation. Trileaflet aortic valve with no stenosis or regurgitation, no vegetation. There was a mobile vegetation on the posterior leaflet of the mitral valve measuring about 2 cm in greatest dimension. The posterior and anterior leaflets of the MV did not coapt properly and there was very eccentric, anteriorly-directed mitral regurgitation. I suspect severe MR. Jet was too eccentric for PISA calculations. There was systolic flattening but not flow reversal in the pulmonary vein doppler pattern. The left atrium was mildly dilated, no LA appendage thrombus. Normal right atrium. No evidence for PFO or ASD. Normal caliber aorta  with mild plaque in the descending thoracic aorta.  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   PHYSICAL EXAM  Temp:  [98 F (36.7 C)-99.1 F (37.3 C)] 98.4 F (36.9 C) (03/15 1544) Pulse Rate:  [83-91] 83 (03/15 1544) Resp:  [12-27] 19 (03/15 1544) BP: (98-160)/(48-76) 121/75 mmHg (03/15 1544) SpO2:  [90 %-98 %] 95 % (03/15 1544)  General - morbid obesity, well  developed, in no apparent distress.  Ophthalmologic - Fundi not visualized due to small pupils.  Cardiovascular - Regular rate and rhythm.  Mental Status -  Level of arousal and orientation to time, place, and person were intact. Language including expression, naming, repetition, comprehension was assessed and found intact. Fund of Knowledge was assessed and was intact.  Cranial Nerves II - XII - II - Visual field intact OU. III, IV, VI - Extraocular movements intact. V - Facial sensation intact bilaterally. VII - Facial movement intact bilaterally. VIII - Hearing & vestibular intact bilaterally. X - Palate elevates symmetrically. XI - Chin turning & shoulder shrug intact bilaterally. XII - Tongue protrusion intact.  Motor Strength - [name] strength was normal in all extremities and pronator drift was absent.  Bulk was normal and fasciculations were absent.   Motor Tone - Muscle tone was assessed at the neck and appendages and was normal.  Reflexes - [name] reflexes were 1+ in all extremities and she had no pathological reflexes.  Sensory - Light touch, temperature/pinprick were assessed and were symmetrical.    Coordination - The patient had normal movements in the hands and feet with no ataxia or dysmetria.  Tremor was absent.  Gait and Station - deferred due to safety concerns.   ASSESSMENT/PLAN Margaret [name] is a 60 y.o. female with history of hypertension, hyperlipidemia, COPD, asthma, diabetes mellitus, migraines, panic attacks, and morbid obesity, presenting with mild headache, visual changes, low-grade fevers, and nausea. She did not receive IV t-PA due to late presentation and anticoagulation.  Small SAH within the left parieto-occipital region, and 4 punctate infarcts at the watershed area including left MCA/PCA, bilateral MCA/ACA, likely due to endocarditis. No mycotic aneurysm found on CTA. CTV has ruled out CVT.   Resultant  asymptomatic  MRI - left parieto-occipital small SAH, punctate infarcts at the watershed area (left MCA/PCA, b/l MCA/ACA)  MRA - negative  CTA head and neck - no cerebral venous thrombosis, AVM, or aneurysm.  2D Echo - EF 60-65% in 12/2015  LE venous doppler - negative for DVT  EEG - negative   TEE - MV endocarditis and MV regurgitation  LDL - 107  HgbA1c 8.4  Hypercoagulable work up positive lupus anticoagulant likely due to Xarelto use PTA. However, ANA and RF positive, need repeat as outpt  VTE prophylaxis - SCDs Diet Carb Modified Fluid consistency:: Thin; Room service appropriate?: Yes  Xarelto (rivaroxaban) daily prior to admission, now on No antithrombotic secondary to [facility]. Due to endocarditis, no antiplatelet or anticoagulation indicated at this time.  Patient counseled to be compliant with her antithrombotic medications  Ongoing aggressive stroke risk factor management  MV endocarditis with septic emboli - explains spleen infarct, SAH and embolic strokes  TEE showed MV endocarditis and MV regurgitation  CTA head and neck showed no mycotic aneurysm  Blood culture 2/2 strep vividan positive  ID on bard   Elevated WBC  On Rocephin  Repeat BCx pending  May consider cardiology consult to see if surgical intervention needed.  Hypertension  Stable mildly low blood pressures  Avoid hypotension  Hyperlipidemia  Home meds:  Lovastatin 20 mg daily resumed in hospital  LDL 107, goal < 70  On pravastatin 20mg  now  Continue statin at discharge  Diabetes  HgbA1c 8.4, goal < 7.0  Uncontrolled  On lantus  SSI  Other Stroke Risk Factors  Advanced age  Cigarette smoker, quit smoking 3 years ago.  Morbid obesity, Body mass index is 45.5 kg/(m^2).   Hx stroke/TIA  Family hx stroke (father)  Migraines  PVD - aortic athrosclerosis  Other Active Problems  Leukocytosis  Elevated creatinine 1.10  Mild hyponatremia  Pulmonary  HTN  Hilar lymphoadenopathy  Positive ANA and RF, need to repeat as outpt. Rheumatology referral if needed.   Hospital day # 4   Neurology will sign off. Please call with questions. Pt will follow up with Dr. Erlinda Hong at [facility] in about 2 months. Thanks for the consult.   Rosalin Hawking, MD PhD Stroke Neurology 02/05/2016 5:25 PM     To contact Stroke Continuity provider, please refer to [email or web address] After hours, contact General Neurology

## 2016-02-05 NOTE — Progress Notes (Signed)
  Echocardiogram Echocardiogram Transesophageal has been performed.  Jennette Dubin 02/05/2016, 2:37 PM

## 2016-02-06 ENCOUNTER — Inpatient Hospital Stay (HOSPITAL_COMMUNITY): Payer: Medicaid Other

## 2016-02-06 ENCOUNTER — Inpatient Hospital Stay (HOSPITAL_COMMUNITY): Payer: Medicaid Other | Admitting: Anesthesiology

## 2016-02-06 ENCOUNTER — Encounter (HOSPITAL_COMMUNITY)
Admission: EM | Disposition: A | Discharge status: Home Health | Payer: Self-pay | Source: Home / Self Care | Attending: Pulmonary Disease

## 2016-02-06 ENCOUNTER — Encounter (HOSPITAL_COMMUNITY): Payer: Self-pay | Admitting: Thoracic Surgery (Cardiothoracic Vascular Surgery)

## 2016-02-06 DIAGNOSIS — J9601 Acute respiratory failure with hypoxia: Secondary | ICD-10-CM

## 2016-02-06 DIAGNOSIS — I39 Endocarditis and heart valve disorders in diseases classified elsewhere: Secondary | ICD-10-CM

## 2016-02-06 DIAGNOSIS — I340 Nonrheumatic mitral (valve) insufficiency: Secondary | ICD-10-CM

## 2016-02-06 DIAGNOSIS — I5032 Chronic diastolic (congestive) heart failure: Secondary | ICD-10-CM

## 2016-02-06 DIAGNOSIS — I10 Essential (primary) hypertension: Secondary | ICD-10-CM

## 2016-02-06 DIAGNOSIS — I330 Acute and subacute infective endocarditis: Principal | ICD-10-CM

## 2016-02-06 DIAGNOSIS — I748 Embolism and thrombosis of other arteries: Secondary | ICD-10-CM

## 2016-02-06 DIAGNOSIS — I639 Cerebral infarction, unspecified: Secondary | ICD-10-CM

## 2016-02-06 HISTORY — PX: RADIOLOGY WITH ANESTHESIA: SHX6223

## 2016-02-06 LAB — BLOOD GAS, ARTERIAL
Acid-Base Excess: 4.3 mmol/L — ABNORMAL HIGH (ref 0.0–2.0)
Bicarbonate: 28.4 mEq/L — ABNORMAL HIGH (ref 20.0–24.0)
Drawn by: 331471
O2 Content: 3 L/min
O2 SAT: 96.6 %
PCO2 ART: 43.6 mmHg (ref 35.0–45.0)
PH ART: 7.43 (ref 7.350–7.450)
PO2 ART: 84.5 mmHg (ref 80.0–100.0)
Patient temperature: 98.6
TCO2: 29.8 mmol/L (ref 0–100)

## 2016-02-06 LAB — AMMONIA: Ammonia: 29 umol/L (ref 9–35)

## 2016-02-06 LAB — GLUCOSE, CAPILLARY
GLUCOSE-CAPILLARY: 177 mg/dL — AB (ref 65–99)
GLUCOSE-CAPILLARY: 216 mg/dL — AB (ref 65–99)
Glucose-Capillary: 183 mg/dL — ABNORMAL HIGH (ref 65–99)

## 2016-02-06 SURGERY — RADIOLOGY WITH ANESTHESIA
Anesthesia: General

## 2016-02-06 MED ORDER — FUROSEMIDE 10 MG/ML IJ SOLN: 40.0000 mg | Freq: Once | INTRAMUSCULAR | Status: AC

## 2016-02-06 MED ORDER — LORAZEPAM BOLUS VIA INFUSION: 1.0000 mg | Freq: Once | INTRAVENOUS | Status: DC

## 2016-02-06 MED ORDER — LABETALOL HCL 5 MG/ML IV SOLN
INTRAVENOUS | Status: AC
  Filled 2016-02-06: qty 4

## 2016-02-06 MED ORDER — FUROSEMIDE 10 MG/ML IJ SOLN
40.0000 mg | Freq: Two times a day (BID) | INTRAMUSCULAR | Status: DC
  Administered 2016-02-07 – 2016-02-10 (×7): 40 mg via INTRAVENOUS
  Filled 2016-02-06 (×7): qty 4

## 2016-02-06 MED ORDER — ROCURONIUM BROMIDE 100 MG/10ML IV SOLN
INTRAVENOUS | Status: DC | PRN
  Administered 2016-02-06 (×3): 50 mg via INTRAVENOUS

## 2016-02-06 MED ORDER — PROPOFOL 10 MG/ML IV BOLUS
INTRAVENOUS | Status: DC | PRN
  Administered 2016-02-06: 130 mg via INTRAVENOUS

## 2016-02-06 MED ORDER — ANTISEPTIC ORAL RINSE SOLUTION (CORINZ)
7.0000 mL | Freq: Four times a day (QID) | OROMUCOSAL | Status: DC
  Administered 2016-02-06: 7 mL via OROMUCOSAL

## 2016-02-06 MED ORDER — PROPOFOL 1000 MG/100ML IV EMUL
0.0000 ug/kg/min | INTRAVENOUS | Status: DC
  Administered 2016-02-06: 20 ug/kg/min via INTRAVENOUS
  Administered 2016-02-07 (×2): 30 ug/kg/min via INTRAVENOUS
  Administered 2016-02-07: 20 ug/kg/min via INTRAVENOUS
  Administered 2016-02-07: 15 ug/kg/min via INTRAVENOUS
  Filled 2016-02-06 (×6): qty 100

## 2016-02-06 MED ORDER — FENTANYL CITRATE (PF) 100 MCG/2ML IJ SOLN
100.0000 ug | INTRAMUSCULAR | Status: DC | PRN
  Administered 2016-02-07 – 2016-02-09 (×6): 100 ug via INTRAVENOUS
  Filled 2016-02-06 (×6): qty 2

## 2016-02-06 MED ORDER — FENTANYL CITRATE (PF) 100 MCG/2ML IJ SOLN
INTRAMUSCULAR | Status: DC | PRN
  Administered 2016-02-06 (×2): 100 ug via INTRAVENOUS
  Administered 2016-02-06 (×2): 150 ug via INTRAVENOUS

## 2016-02-06 MED ORDER — PHENYLEPHRINE HCL 10 MG/ML IJ SOLN
20.0000 mg | INTRAVENOUS | Status: DC | PRN
  Administered 2016-02-06: 10 ug/min via INTRAVENOUS

## 2016-02-06 MED ORDER — AMPICILLIN SODIUM 2 G IJ SOLR
2.0000 g | INTRAMUSCULAR | Status: DC
  Administered 2016-02-07 – 2016-02-10 (×21): 2 g via INTRAVENOUS
  Filled 2016-02-06 (×29): qty 2000

## 2016-02-06 MED ORDER — PROPOFOL 1000 MG/100ML IV EMUL
INTRAVENOUS | Status: AC
  Filled 2016-02-06: qty 100

## 2016-02-06 MED ORDER — LABETALOL HCL 5 MG/ML IV SOLN: 10.0000 mg | INTRAVENOUS | Status: DC | PRN

## 2016-02-06 MED ORDER — EPHEDRINE SULFATE 50 MG/ML IJ SOLN
INTRAMUSCULAR | Status: DC | PRN
  Administered 2016-02-06: 20 mg via INTRAVENOUS
  Administered 2016-02-06: 10 mg via INTRAVENOUS

## 2016-02-06 MED ORDER — CHLORHEXIDINE GLUCONATE 0.12% ORAL RINSE (MEDLINE KIT)
15.0000 mL | Freq: Two times a day (BID) | OROMUCOSAL | Status: DC
  Administered 2016-02-06: 15 mL via OROMUCOSAL

## 2016-02-06 MED ORDER — ALBUTEROL SULFATE (2.5 MG/3ML) 0.083% IN NEBU: 2.5000 mg | INHALATION_SOLUTION | RESPIRATORY_TRACT | Status: DC | PRN

## 2016-02-06 MED ORDER — LORAZEPAM 2 MG/ML IJ SOLN
1.0000 mg | Freq: Once | INTRAMUSCULAR | Status: AC
  Administered 2016-02-06: 1 mg via INTRAVENOUS

## 2016-02-06 MED ORDER — NITROGLYCERIN 1 MG/10 ML FOR IR/CATH LAB
INTRA_ARTERIAL | Status: AC
  Administered 2016-02-06 (×2): 25 ug
  Filled 2016-02-06: qty 10

## 2016-02-06 MED ORDER — FAMOTIDINE IN NACL 20-0.9 MG/50ML-% IV SOLN
20.0000 mg | Freq: Two times a day (BID) | INTRAVENOUS | Status: DC
  Administered 2016-02-07 – 2016-02-10 (×9): 20 mg via INTRAVENOUS
  Filled 2016-02-06 (×11): qty 50

## 2016-02-06 MED ORDER — SUCCINYLCHOLINE CHLORIDE 20 MG/ML IJ SOLN
INTRAMUSCULAR | Status: DC | PRN
  Administered 2016-02-06: 120 mg via INTRAVENOUS

## 2016-02-06 MED ORDER — LIDOCAINE HCL (CARDIAC) 20 MG/ML IV SOLN
INTRAVENOUS | Status: DC | PRN
  Administered 2016-02-06: 100 mg via INTRAVENOUS

## 2016-02-06 MED ORDER — FENTANYL CITRATE (PF) 100 MCG/2ML IJ SOLN
100.0000 ug | INTRAMUSCULAR | Status: DC | PRN
  Administered 2016-02-06: 100 ug via INTRAVENOUS

## 2016-02-06 MED ORDER — SODIUM CHLORIDE 0.9 % IV SOLN
INTRAVENOUS | Status: DC | PRN
  Administered 2016-02-06 (×2): via INTRAVENOUS

## 2016-02-06 MED ORDER — LORAZEPAM 2 MG/ML IJ SOLN: 1.0000 mg | Freq: Once | INTRAMUSCULAR | Status: AC

## 2016-02-06 MED ORDER — LABETALOL HCL 5 MG/ML IV SOLN: 20.0000 mg | INTRAVENOUS | Status: DC | PRN

## 2016-02-06 MED ORDER — EPTIFIBATIDE 20 MG/10ML IV SOLN
INTRAVENOUS | Status: AC
  Administered 2016-02-06: 1 mg
  Administered 2016-02-06: 2.5 mg
  Administered 2016-02-06: 2 mg
  Filled 2016-02-06: qty 10

## 2016-02-06 MED ORDER — LORAZEPAM 2 MG/ML IJ SOLN
INTRAMUSCULAR | Status: AC
Start: 2016-02-06 — End: 2016-02-06
  Filled 2016-02-06: qty 1

## 2016-02-06 MED ORDER — LORAZEPAM 2 MG/ML IJ SOLN
INTRAMUSCULAR | Status: AC
  Filled 2016-02-06: qty 1

## 2016-02-06 MED ORDER — SODIUM CHLORIDE 0.9 % IV SOLN
INTRAVENOUS | Status: DC | PRN
  Administered 2016-02-06: 18:00:00 via INTRAVENOUS

## 2016-02-06 MED ORDER — FUROSEMIDE 10 MG/ML IJ SOLN
INTRAMUSCULAR | Status: AC
  Administered 2016-02-06: 12:00:00
  Filled 2016-02-06: qty 4

## 2016-02-06 NOTE — Significant Event (Signed)
Rapid Response Event Note  Overview:  Called to see patient with change in neuro status Time Called: 1556 Arrival Time: 1603 Event Type: Neurologic  Initial Focused Assessment:  On arrival patient supine in bed - skin warm and dry - arouses to name but drowsy - speech slurred - left facial droop with tongue deviation to left - left hemianopia - no blink to threat - right gaze preference but will cross to left when name called - left hemiparesis - left neglect - feels pain on left - will lift left arm with right - no aphasia - will follow simple commands - NIHHS 19 - pupils 4 mm ERL - BP 108/80 HR 91 SR - RR 20 - O2 sats 98% on 3 liter nasal cannula.  CBG 183. Patient LSW around 1410 after CT scan by Dr. Hartford Poli.  Was sleeping after that and husband noticed neuro change when she awakened and called the RN Jocelyn Lamer.  Ammonia this afternoon 28.  Patient with recent splenic infarct from vegetation on MV - admitted here with septic emoboli stroke and SAH on 3/11 - presumably from endocarditis.     Interventions:  HOB down 20 degrees - watch airway - NS bolus 250cc IV started for BP - stat call to Dr. Hartford Poli with report of above findings.  On call back Dr. Hartford Poli reports to charge RN Deloria Lair he will notify neuro team who signed off yesterday.  Patient becoming more lethargic - BP 128/70 HR 93 RR 20 O2 sats 100%.  Speech becoming more slurred - patient more sleepy.  Repeat call to Dr. Hartford Poli with update - requesting to transfer patient to ICU and for stat CT scan.  Orders for transfer but deferred test request to neuro.  Dr. Erlinda Hong to bedside - patient becoming more agitated with repeated exams - NIHHS 19 with MD exam.  BP 133/60 HR 93 O2s sat 98 with RR 22.  Stat call for CT - unable to secure IV site for CTA or angio - to CT scan stat - Dr. Erlinda Hong speaking with IR for possible intervention.  CT scan without incident - patient more sleepy - Dr. Estanislado Pandy here -speaking with husband = to IR suite - placed on table - handoff  to [facility].  Remained with husband for support.  Jocelyn Lamer RN on 5W calling report to [employer].     Event Summary: Name of Physician Notified: Dr. Hartford Poli at  (pta RRT)  Name of Consulting Physician Notified: Dr. Erlinda Hong at  (by Dr. Hartford Poli)  Outcome:  (65m)  Event End Time: 1800  Quin Hoop

## 2016-02-06 NOTE — Consult Note (Signed)
[address]       [place] [address]             [phone]          CARDIOTHORACIC SURGERY CONSULTATION REPORT  PCP is Harvie Junior, MD Referring Provider is Ena Dawley, MD  Reason for consultation:  Bacterial Endocarditis   HPI:  Patient is a 60 yo obese female with history of hypertension, chronic diastolic CHF, COPD, type II diabetes mellitus, and asthma who was hospitalized from 01/14/2016 through 01/16/2016 for acute abdominal pain and was found to have a large splenic infarct on CT scan.  She did not have history of fevers but WBC was elevated to 17,500 at the time.  Neither sedimentation rate nor blood cultures were not obtained.  The patient was noted to have a "III-IV/VI holosystolic murmur" on physical exam at the time of her admission.  An echocardiogram with bubble study was performed and revealed "mild mitral regurgitation" but no obvious source for embolization.   TEE was not performed.  CT angiogram revealed findings c/w chronic pulmonary hypertension and pulmonary edema.  Vascular Surgery was consulted and a search for cardiac source was recommended.  The patient was treated with Ciprofloxacin and Flagyl for unclear reasons. Elevated liver enzymes were attributed to presumed cirrhosis although the patient has no history of significant alcohol consumption.  NASH was presumed to be the cause.  No clear source for embolization was ever discovered.  The patient was discharged from the hospital on Xarelto.  The patient was never seen in consultation by Cardiology but was referred for outpatient TEE.  She was seen by hematology/oncology on 01/23/2016 who felt that splenic infarct was most likely secondary to splenic congestion from liver cirrhosis combined with peripheral vascular disease.   The patient was readmitted to the hospital 02/01/2016 with low grade fevers, headache, vision changes, nausea and diarrhea.  A CT scan of the head revealed a  subarachnoid bleed.  MRI confirmed the presence of a "small volume" subarachnoid bleed and revealed at least 4 punctate infarcts consistent with embolic events.  Blood cultures grew Streptococcus viridans.  A TEE was performed and revealed findings consistent with bacterial endocarditis including a 2 cm vegetation on the mitral valve and severe mitral regurgitation.  Cardiothoracic surgical consultation was requested.  Upon my arrival to patient's room the rapid response team was at bedside.  Patient had apparently developed acute onset decreased level of consciousness with left sided hemiparesis and facial droop.  Patient somewhat lethargic but following commands with right arm and leg.  Family not present at bedside currently.    Past Medical History  Diagnosis Date  . Hypertension   . Hyperlipidemia   . COPD (chronic obstructive pulmonary disease) ([facility]) 04/20/12  . Asthma   . Type II diabetes mellitus ([facility])   . Anemia   . H/O hiatal hernia   . Osteoarthritis   . Pneumonia 04/2012  . Migraines   . Panic attacks 04/20/12  . Morbid obesity ([facility])   . Heart murmur     Past Surgical History  Procedure Laterality Date  . Laceration repair  1966    "right upper arm"  . Dilation and curettage of uterus  1990's  . Finger reattachment  1973    "right ring finger"    Family History  Problem Relation Age of Onset  . Allergies    . Hypertension    . Coronary artery disease Mother 15  .  Stroke Father   . COPD Father   . Clotting disorder Maternal Grandmother     died from blood clot    Social History   Social History  . Marital Status: Married    Spouse Name: N/A  . Number of Children: N/A  . Years of Education: N/A   Occupational History  . Not on file.   Social History Main Topics  . Smoking status: Former Smoker -- 0.20 packs/day for 40 years    Types: Cigarettes    Quit date: 04/20/2012  . Smokeless tobacco: Never Used     Comment: 6/5//13 "a pack of cigarettes would  last me 1 - 1 1/2 wk"  . Alcohol Use: No  . Drug Use: No  . Sexual Activity: Not Currently     Comment: married, 2 children, work with computers   Other Topics Concern  . Not on file   Social History Narrative    Prior to Admission medications   Medication Sig Start Date End Date Taking? Authorizing Provider  albuterol (PROVENTIL HFA;VENTOLIN HFA) 108 (90 BASE) MCG/ACT inhaler Inhale 2 puffs into the lungs every 4 (four) hours as needed. For shortness of breath. 04/30/12  Yes Shanker Kristeen Mans, MD  albuterol (PROVENTIL) (2.5 MG/3ML) 0.083% nebulizer solution Take 2.5 mg by nebulization at bedtime.    Yes Historical Provider, MD  Alogliptin-Metformin HCl 12.03-999 MG TABS Take 1 tablet by mouth 2 (two) times daily.   Yes Historical Provider, MD  aspirin 81 MG chewable tablet Chew 81 mg by mouth daily.   Yes Historical Provider, MD  atenolol (TENORMIN) 50 MG tablet Take 12.5 mg by mouth 2 (two) times daily.    Yes Historical Provider, MD  cholecalciferol (VITAMIN D) 1000 units tablet Take 1,000 Units by mouth 2 (two) times daily.   Yes Historical Provider, MD  furosemide (LASIX) 40 MG tablet Take 40 mg by mouth daily.    Yes Historical Provider, MD  ibuprofen (ADVIL,MOTRIN) 200 MG tablet Take 600 mg by mouth every 6 (six) hours as needed for headache or mild pain.   Yes Historical Provider, MD  insulin NPH-regular (NOVOLIN 70/30) (70-30) 100 UNIT/ML injection Inject 6-16 Units into the skin 2 (two) times daily with a meal.   Yes Historical Provider, MD  loperamide (IMODIUM) 2 MG capsule Take 2 mg by mouth daily as needed for diarrhea or loose stools.   Yes Historical Provider, MD  lovastatin (MEVACOR) 20 MG tablet Take 10 mg by mouth at bedtime.   Yes Historical Provider, MD  naproxen sodium (ANAPROX) 220 MG tablet Take 220 mg by mouth 2 (two) times daily as needed (pain).   Yes Historical Provider, MD  nitroGLYCERIN (NITROSTAT) 0.4 MG SL tablet Place 1 tablet (0.4 mg total) under the tongue  every 5 (five) minutes x 3 doses as needed for chest pain. 06/20/12 02/01/16 Yes Jettie Booze, MD  oxyCODONE-acetaminophen (PERCOCET) 10-325 MG tablet Take 1 tablet by mouth 2 (two) times daily as needed for pain.   Yes Historical Provider, MD  Potassium 99 MG TABS Take 99 mg by mouth every morning.   Yes Historical Provider, MD  Rivaroxaban (XARELTO STARTER PACK) 15 & 20 MG TBPK Take as directed on package: Start with one 15mg  tablet by mouth twice a day with food. On Day 22, switch to one 20mg  tablet once a day with food. 01/16/16  Yes Maryann Mikhail, DO  spironolactone (ALDACTONE) 25 MG tablet Take 50 mg by mouth daily.    Yes  Historical Provider, MD  ciprofloxacin (CIPRO) 500 MG tablet Take 1 tablet (500 mg total) by mouth 2 (two) times daily. Patient not taking: Reported on 02/01/2016 01/16/16   Velta Addison Mikhail, DO  metroNIDAZOLE (FLAGYL) 500 MG tablet Take 1 tablet (500 mg total) by mouth every 8 (eight) hours. Patient not taking: Reported on 02/01/2016 01/16/16   Cristal Ford, DO    Current Facility-Administered Medications  Medication Dose Route Frequency Provider Last Rate Last Dose  . albuterol (PROVENTIL) (2.5 MG/3ML) 0.083% nebulizer solution 2.5 mg  2.5 mg Nebulization QHS Toy Baker, MD   2.5 mg at 02/05/16 2142  . ampicillin (OMNIPEN) 2 g in sodium chloride 0.9 % 50 mL IVPB  2 g Intravenous 6 times per day Lauren D Bajbus, RPH      . antiseptic oral rinse (CPC / CETYLPYRIDINIUM CHLORIDE 0.05%) solution 7 mL  7 mL Mouth Rinse BID Toy Baker, MD   7 mL at 02/06/16 0853  . furosemide (LASIX) injection 40 mg  40 mg Intravenous BID Verlee Monte, MD      . HYDROcodone-acetaminophen (NORCO/VICODIN) 5-325 MG per tablet 1 tablet  1 tablet Oral Q4H PRN Domenic Polite, MD   1 tablet at 02/06/16 0151  . hydrOXYzine (ATARAX/VISTARIL) tablet 25 mg  25 mg Oral TID PRN Velvet Bathe, MD   25 mg at 02/03/16 1239  . insulin aspart (novoLOG) injection 0-9 Units  0-9 Units  Subcutaneous TID WC Velvet Bathe, MD   3 Units at 02/06/16 1301  . insulin glargine (LANTUS) injection 5 Units  5 Units Subcutaneous QHS Toy Baker, MD   5 Units at 02/05/16 2150  . LORazepam (ATIVAN) 2 MG/ML injection           . LORazepam (ATIVAN) injection 1 mg  1 mg Intravenous Once Verlee Monte, MD      . ondansetron (ZOFRAN) injection 4 mg  4 mg Intravenous Q6H PRN Gardiner Barefoot, NP   4 mg at 02/05/16 0820  . pravastatin (PRAVACHOL) tablet 20 mg  20 mg Oral q1800 Toy Baker, MD   20 mg at 02/05/16 1758  . sodium chloride flush (NS) 0.9 % injection 3 mL  3 mL Intravenous Q12H Verlee Monte, MD   3 mL at 02/06/16 0852  . sodium chloride flush (NS) 0.9 % injection 3 mL  3 mL Intravenous PRN Verlee Monte, MD      . traMADol (ULTRAM) tablet 50 mg  50 mg Oral Q6H PRN Velvet Bathe, MD   50 mg at 02/05/16 2052    Allergies  Allergen Reactions  . Codeine Hives, Itching and Other (See Comments)    "breathing problems"  . Latex Itching      Review of Systems:  Not able to complete at present     Physical Exam:   BP 108/80 mmHg  Pulse 91  Temp(Src) 98.6 F (37 C) (Oral)  Resp 20  Ht 5\' 5"  (1.651 m)  Wt 273 lb 6.4 oz (124.013 kg)  BMI 45.50 kg/m2  SpO2 98%  General:  Obese, ill-appearing, breathing comfortably  HEENT:  Unremarkable   Neck:   no JVD, no bruits, no adenopathy   Chest:   Scattered rhonchi, breath sounds symmetrical  CV:   RRR, III/VI holosystolic murmur   Abdomen:  soft, non-tender, no masses   Extremities:  warm, well-perfused, pulses diminished, no lower extremity edema  Rectal/GU  Deferred  Neuro:   Grossly non-focal and symmetrical throughout  Skin:   Clean and dry, no rashes,  no breakdown  Diagnostic Tests:  CT ANGIOGRAPHY CHEST, ABDOMEN AND PELVIS  TECHNIQUE: Multidetector CT imaging through the chest, abdomen and pelvis was performed using the standard protocol during bolus administration of intravenous contrast. Multiplanar  reconstructed images and MIPs were obtained and reviewed to evaluate the vascular anatomy.  CONTRAST: 100 mL Omnipaque 350  COMPARISON: Contrast-enhanced CT Abdomen and Pelvis 811 hours today. Chest CTA 06/18/2012.  FINDINGS: CTA CHEST FINDINGS  Large body habitus. Lower lung volumes compared to 2013 with widespread pulmonary septal thickening and ground-glass opacity. No pleural effusion. No consolidation. Central airways are patent aside from atelectatic changes.  Negative thoracic inlet. No axillary lymphadenopathy. Chronic mild to moderate mediastinal and hilar lymphadenopathy has not significantly changed since 2013. No pleural or pericardial effusion.  Osteopenia and bulky degenerative osteophytosis in the thoracic spine. New since 2013 is erosion with irregular margins of the T8-T9 in plates (sagittal image 96) there appears to be chronic interbody fusion at this level via bulky right antral lateral endplate osteophytes. There is no retropulsion of bone or abnormal paraspinal soft tissue here. No other acute or suspicious osseous lesion in the chest.  VASCULAR FINDINGS:  Central pulmonary artery enlargement (up to 42 mm diameter main pulmonary artery versus adjacent ascending aorta measuring 33 mm diameter) is stable since 2013. No central or hilar pulmonary artery filling defect. Thoracic aorta is remarkable for scattered soft and calcified atherosclerotic plaque, most apparent in the arch (series 8, image 109) which is mildly progressed since 2013. Great vessel origins are patent. Calcified coronary artery atherosclerosis is evident. No thoracic aortic dissection or aneurysm.  Review of the MIP images confirms the above findings.  CTA ABDOMEN AND PELVIS FINDINGS  Chronic L5 pars fractures with grade 1 spondylolisthesis. Osteopenia. No acute osseous abnormality identified.  Large volume of excreted IV contrast in the urinary bladder which results  in streak artifact both through the pelvis. No pelvic free fluid is evident. Visualized pelvic and abdominal viscera appear stable since this morning. Hypo enhancement of the superior half of the spleen re- demonstrated. No abdominal free fluid. Mild upper abdominal lymphadenopathy again noted.  VASCULAR FINDINGS:  Intermittent soft and calcified atherosclerosis of the abdominal aorta. Major abdominal aortic branches remain patent including the celiac artery and main splenic artery. Major SMA branches appear patent. There does appear to be atherosclerotic stenosis at the IMA origin which remains patent. Calcified plaque continues into the bilateral iliac arteries. The visualized bilateral femoral artery is are patent with mild calcified plaque. No arterial dissection or aneurysm identified.  Review of the MIP images confirms the above findings.  IMPRESSION: 1. Intermittent soft and calcified aortic atherosclerosis in the chest and abdomen. Major aortic branches including the celiac and main splenic artery remain patent. There does appear to be atherosclerotic stenosis at the IMA origin. 2. Chronic pulmonary artery hypertension, with stable main pulmonary artery enlargement since 2013. 3. Pulmonary ground-glass opacity and septal thickening is nonspecific with top differential considerations of pulmonary edema and atypical infection. No pleural effusion. 4. Stable visualized abdominal viscera since CT Abdomen and Pelvis this morning, splenic infarct re- demonstrated. 5. New T8-T9 vertebral endplate erosions since 2013. This is nonspecific but benign or degenerative etiology is favored over infectious or malignant process. Thoracic spine MRI (without and with contrast preferred) may characterize further. 6. Chronic nonspecific mediastinal lymphadenopathy, stable since 2013.   Electronically Signed  By: Genevie Ann M.D.  On: 01/14/2016 13:47   Transthoracic  Echocardiography  (Report amended )  Patient:  Margaret [name], Margaret [name] MR #: [id number] Study Date: 01/14/2016 Gender: F Age: 41 Height: 165.1 cm Weight: 131.5 kg BSA: 2.53 m^2 Pt. Status: Room:  PERFORMING [facility], Inpatient SONOGRAPHER Darlina Sicilian, RDCS  cc:  ------------------------------------------------------------------- LV EF: 60% - 65%  ------------------------------------------------------------------- Indications: Dyspnea 786.09.  ------------------------------------------------------------------- History: PMH: Splenic Infarct. Chronic obstructive pulmonary disease. Risk factors: Hypertension. Diabetes mellitus. Dyslipidemia.  ------------------------------------------------------------------- Study Conclusions  - Left ventricle: The cavity size was normal. There was mild  concentric hypertrophy. Systolic function was normal. The  estimated ejection fraction was in the range of 60% to 65%. Wall  motion was normal; there were no regional wall motion  abnormalities. Features are consistent with a pseudonormal left  ventricular filling pattern, with concomitant abnormal relaxation  and increased filling pressure (grade 2 diastolic dysfunction).  Doppler parameters are consistent with high ventricular filling  pressure. - Mitral valve: Calcified annulus. There was mild regurgitation. - Atrial septum: No defect or patent foramen ovale was identified. - Pulmonic valve: There was trivial regurgitation.  Impressions:  - No cardiac source of emboli was indentified.  Recommendations: Consider transesophageal echocardiography if clinically indicated in order to exclude intracardiac thrombus.  Transthoracic echocardiography. M-mode, complete 2D, spectral Doppler, and color Doppler. Birthdate: Patient birthdate: August 28, 1956. Age: Patient is 60 yr old. Sex: Gender: female. BMI:  48.2 kg/m^2. Blood pressure: 133/105 Patient status: Inpatient. Study date: Study date: 01/14/2016. Study time: 11:53 AM. Location: Emergency department.  -------------------------------------------------------------------  ------------------------------------------------------------------- Left ventricle: The cavity size was normal. There was mild concentric hypertrophy. Systolic function was normal. The estimated ejection fraction was in the range of 60% to 65%. Wall motion was normal; there were no regional wall motion abnormalities. Features are consistent with a pseudonormal left ventricular filling pattern, with concomitant abnormal relaxation and increased filling pressure (grade 2 diastolic dysfunction). Doppler parameters are consistent with high ventricular filling pressure.  ------------------------------------------------------------------- Aortic valve: Trileaflet; normal thickness leaflets. Mobility was not restricted. Doppler: Transvalvular velocity was within the normal range. There was no stenosis. There was no regurgitation.  ------------------------------------------------------------------- Aorta: Aortic root: The aortic root was normal in size.  ------------------------------------------------------------------- Mitral valve: Calcified annulus. Mobility was not restricted. Doppler: Transvalvular velocity was within the normal range. There was no evidence for stenosis. There was mild regurgitation. Peak gradient (D): 8 mm Hg.  ------------------------------------------------------------------- Left atrium: The atrium was normal in size.  ------------------------------------------------------------------- Atrial septum: No defect or patent foramen ovale was identified.  ------------------------------------------------------------------- Right ventricle: The cavity size was normal. Wall thickness was normal. Systolic function  was normal.  ------------------------------------------------------------------- Pulmonic valve: Structurally normal valve. Cusp separation was normal. Doppler: Transvalvular velocity was within the normal range. There was no evidence for stenosis. There was trivial regurgitation.  ------------------------------------------------------------------- Tricuspid valve: Structurally normal valve. Doppler: Transvalvular velocity was within the normal range. There was trivial regurgitation.  ------------------------------------------------------------------- Pulmonary artery: The main pulmonary artery was normal-sized. Systolic pressure could not be accurately estimated.  ------------------------------------------------------------------- Right atrium: The atrium was normal in size.  ------------------------------------------------------------------- Pericardium: There was no pericardial effusion.  ------------------------------------------------------------------- Systemic veins: Inferior vena cava: The vessel was dilated. The respirophasic diameter changes were blunted (< 50%), consistent with elevated central venous pressure.  ------------------------------------------------------------------- Measurements  Left ventricle Value Reference LV ID, ED, PLAX chordal 45.1 mm 43 - 52 LV ID, ES, PLAX chordal 34.7 mm 23 - 38 LV fx shortening, PLAX chordal (L) 23 % >=29 LV PW thickness, ED 11.5 mm --------- IVS/LV PW ratio, ED 1.12 <=1.3 LV e&', lateral 7.29 cm/s --------- LV E/e&', lateral  19.62 --------- LV e&', medial 7.62 cm/s --------- LV E/e&', medial 18.77 --------- LV e&',  average 7.46 cm/s --------- LV E/e&', average 19.18 ---------  Ventricular septum Value Reference IVS thickness, ED 12.9 mm ---------  LVOT Value Reference LVOT ID, S 23 mm --------- LVOT area 4.15 cm^2 ---------  Aorta Value Reference Aortic root ID, ED 37 mm ---------  Left atrium Value Reference LA ID, A-P, ES 46 mm --------- LA ID/bsa, A-P 1.82 cm/m^2 <=2.2 LA volume, S 77.6 ml --------- LA volume/bsa, S 30.6 ml/m^2 --------- LA volume, ES, 1-p A4C 67.5 ml --------- LA volume/bsa, ES, 1-p A4C 26.6 ml/m^2 --------- LA volume, ES, 1-p A2C 86.7 ml --------- LA volume/bsa, ES, 1-p A2C 34.2 ml/m^2 ---------  Mitral valve Value Reference Mitral E-wave peak velocity 143 cm/s --------- Mitral A-wave peak velocity 99 cm/s --------- Mitral deceleration time 183 ms 150 - 230 Mitral peak gradient, D 8 mm Hg --------- Mitral E/A ratio, peak 1.4 ---------  Right ventricle Value Reference TAPSE 20.7 mm --------- RV s&', lateral, S 16.2 cm/s ---------  Legend: (L) and (H) mark values outside specified reference  range.  ------------------------------------------------------------------- Michaelle Birks, MD 2017-02-21T12:57:17    CT HEAD WITHOUT CONTRAST  TECHNIQUE: Contiguous axial images were obtained from the base of the skull through the vertex without intravenous contrast.  COMPARISON: 10/09/2013  FINDINGS: Skull and Sinuses:Negative for fracture or destructive process. The visualized mastoids, middle ears, and imaged paranasal sinuses are clear.  Visualized orbits: Negative.  Brain: There is unexpected subarachnoid high-density effacement in the left parietal occipital region, newly seen. This is likely fading subarachnoid hemorrhage. Reversible cerebral vasoconstrictive syndrome, posterior reversible encephalopathy syndrome (which would have to be occult by CT), or anti coagulation complication are primary considerations.  No cortical or subcortical low-density. No evidence of acute infarct. No hydrocephalus or shift.  These results were called by telephone at the time of interpretation on 02/01/2016 at 2:44 am to Dr. Noemi Chapel , who verbally acknowledged these results.  IMPRESSION: Localized subarachnoid hemorrhage/debris in the left parietal occipital region as discussed above. MRI/MRA recommended.   Electronically Signed  By: Monte Fantasia M.D.  On: 02/01/2016 02:48       MRI HEAD WITHOUT CONTRAST  MRA HEAD WITHOUT CONTRAST  TECHNIQUE: Multiplanar, multiecho pulse sequences of the brain and surrounding structures were obtained without intravenous contrast. Angiographic images of the head were obtained using MRA technique without contrast.  COMPARISON: Prior CT from earlier the same day.  FINDINGS: MRI HEAD FINDINGS  Study is degraded by motion artifact.  Mild diffuse prominence of the CSF containing spaces is compatible with generalized cerebral atrophy. Mild scattered T2/FLAIR hyperintensity within the  periventricular and deep white matter present, most consistent with chronic small vessel ischemic disease, mild for age.  There is subtle susceptibility artifact with hyperintense FLAIR signal intensity within the cortical sulci of the left parieto-occipital region, corresponding with abnormality seen on prior CT. Finding concerning for small volume subarachnoid hemorrhage/debris. Mild gyral swelling within this region without significant mass effect. No evidence for posterior reversible encephalopathy syndrome. No other hemorrhage seen elsewhere within the brain.  Scattered small subcentimeter foci of restricted diffusion seen within the subcortical white matter and cortical gray matter of the bilateral parietal lobes (series 5, image 29, 32) additional small punctate focus of restricted diffusion more inferiorly at the level of the subarachnoid hemorrhage. Major intracranial vascular flow voids are maintained.  No mass lesion, midline shift, or mass effect. No hydrocephalus. No extra-axial fluid collection.  Craniocervical junction normal. Visualized upper cervical spine within normal limits. Pituitary gland normal. No acute abnormality about the orbits.  Paranasal sinuses and mastoids are clear. Inner ear structures within normal limits.  Bone marrow signal intensity normal. Scalp soft tissues unremarkable.  MRA HEAD FINDINGS  ANTERIOR CIRCULATION:  Visualized distal cervical segments of the internal carotid arteries are patent with antegrade flow. Petrous, cavernous, and supraclinoid ICA is widely patent. A1 segments, anterior communicating artery, and anterior cerebral arteries well opacified. M1 segments widely patent without stenosis or occlusion. MCA branches normal. Distal MCA branches well opacified.  POSTERIOR CIRCULATION:  Vertebral arteries patent to the vertebrobasilar junction. Left vertebral artery dominant. Posterior inferior cerebral  arteries patent. Basilar artery well opacified to its distal aspect. Superior cerebellar and posterior cerebral arteries well opacified bilaterally. Small left posterior communicating artery noted.  No aneurysm. No obvious high-grade stenosis with the intracranial circulation on this motion degraded study.  IMPRESSION: MRI HEAD IMPRESSION:  1. Small volume subarachnoid hemorrhage/debris within the left parieto-occipital region, corresponding to abnormality seen on prior CT. Minimal gyral swelling within this region without significant mass effect or other abnormality. 2. Few sub cm cortical/subcortical ischemic infarcts within the bilateral parietal lobes as above.  MRA HEAD IMPRESSION:  Negative intracranial MRA.   Electronically Signed  By: Jeannine Boga M.D.  On: 02/01/2016 06:47    Procedure: TEE  Indication: Endocarditis  Sedation: Versed 2 mg IV, Fentanyl 25 mcg IV  Findings: Please see echo section for full report. Normal LV size with EF 60-65%. Normal wall motion. Normal RV size and systolic function. Trivial TR, no TV vegetation. No PV vegetation. Trileaflet aortic valve with no stenosis or regurgitation, no vegetation. There was a mobile vegetation on the posterior leaflet of the mitral valve measuring about 2 cm in greatest dimension. The posterior and anterior leaflets of the MV did not coapt properly and there was very eccentric, anteriorly-directed mitral regurgitation. I suspect severe MR. Jet was too eccentric for PISA calculations. There was systolic flattening but not flow reversal in the pulmonary vein doppler pattern. The left atrium was mildly dilated, no LA appendage thrombus. Normal right atrium. No evidence for PFO or ASD. Normal caliber aorta with mild plaque in the descending thoracic aorta.   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   Loralie Champagne 02/05/2016 2:06 PM     CT ABDOMEN WITH CONTRAST  TECHNIQUE: Multidetector CT imaging of the abdomen was performed using the standard protocol following bolus administration of intravenous contrast.  CONTRAST: 100 cc Omnipaque 300 IV.  COMPARISON: 01/14/2016 CT abdomen/ pelvis.  FINDINGS: Lower chest: No significant pulmonary nodules or acute consolidative airspace disease. There is stable mosaic attenuation at the lung bases. Stable trace layering right pleural effusion.  Hepatobiliary: Diffuse hepatic steatosis. No liver mass. No definite liver surface irregularity. Normal gallbladder with no radiopaque cholelithiasis. No biliary ductal dilatation.  Pancreas: Normal, with no mass or duct dilation.  Spleen: There is new mild splenomegaly (craniocaudal splenic length 14.9 cm, increased from 12.6 cm on 02/29/2017). There is been continued evolution of the previously described splenic infarct involving the upper 50% of the spleen, which is now all fluid density and distended. There is a new large splenic infarct in the lower 50% of the spleen. Only approximately 25% of the splenic parenchyma remains perfused. Splenic artery and celiac trunk appear patent.  Adrenals/Urinary Tract: Normal adrenals. Simple 2.1 cm anterior upper and 1.5 cm lower left renal cysts. No  hydronephrosis. No new renal lesions.  Stomach/Bowel: Grossly normal stomach. Visualized small and large bowel is normal caliber, with no bowel wall thickening.  Vascular/Lymphatic: Atherosclerotic nonaneurysmal abdominal aorta. Patent portal, splenic, hepatic and renal veins. No pathologically enlarged lymph nodes in the abdomen.  Other: No pneumoperitoneum, ascites or focal fluid collection.  Musculoskeletal: Re- demonstrated are endplate erosions at 579FGE (series 6/ image 125), not appreciably changed since 01/14/2016, although new since 06/18/2012. Moderate degenerative changes in  the visualized thoracolumbar spine.  IMPRESSION: 1. 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 Evolution of the previously described splenic infarct involving the upper 50% of the spleen. Spleen is now mildly enlarged. Cardiac embolic source remains the most likely etiology. Splenic artery and vein appear patent. 2. Stable endplate erosions at 579FGE, new since 2013, nonspecific. Consider further evaluation with thoracic spine MRI. 3. Diffuse hepatic steatosis. 4. Mosaic attenuation at the lung bases, favor mosaic perfusion due to chronic pulmonary arterial hypertension.   Electronically Signed  By: Ilona Sorrel M.D.  On: 02/05/2016 08:16   CT HEAD WITHOUT CONTRAST  TECHNIQUE: Contiguous axial images were obtained from the base of the skull through the vertex without intravenous contrast.  COMPARISON: 02/01/2016  FINDINGS: Bony calvarium is intact. No findings to suggest acute hemorrhage, acute infarction or space-occupying mass lesion is seen. Some gyral swelling is noted similar to that seen on the prior exam in the left posterior parietal region. This is stable from the prior exam. No acute abnormality is noted.  IMPRESSION: No significant change from the prior exam. No acute hemorrhage is noted.   Electronically Signed  By: Inez Catalina M.D.  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  Blood cultures have grown Streptococcus viridans.  I have personally reviewed the patient's previous transthoracic echocardiogram and the TEE performed yesterday.  She has clear evidence of bacterial endocarditis with a relatively large vegetation adherent to the anterior leaflet of the mitral valve and severe mitral regurgitation.  There is no sign of extension into the mitral annulus, abscess formation, or involvement of other valves.   There  is significant calcification in the posterior mitral annulus which is likely chronic.  LV function remains normal.  She has not developed worsening CHF or AV block.  Unfortunately the patient appears to be having acute neurologic decompensation suggestive of another major embolic stroke.  Whether or not she will recover remains to be seen.  If she does she will eventually need mitral valve repair/replacement, but at present her prognosis is guarded at best.   Plan:  We will continue to follow.  [name] family is not currently at the bedside for consultation.   I spent in excess of 120 minutes during the conduct of this hospital consultation and >50% of this time involved direct face-to-face encounter for counseling and/or coordination of the patient's care.   Valentina Gu. Roxy Manns, MD 02/06/2016 4:11 PM

## 2016-02-06 NOTE — Progress Notes (Addendum)
[facility] PROGRESS NOTE  Margaret [name] NFA:[id number] DOB: 12/02/1955 DOA: 02/01/2016 PCP: Jearld Lesch, MD   Subjective: Patient fell earlier today after she slipped in her urine. Transient slurred speech and hypoxia. Has cirrhosis check ammonia. Obtain MRI of the brain, rule out new strokes or regression of the [facility]. Obtain chest x-ray and give Lasix.  HPI: Margaret [name] is a 60 y.o. female with Obesity, COPD, DM, HTN, admitted with punctate CVAs and SAH, she was recently admitted to [facility] with Splenic infarct and discharged home on xarelto, normal TEE then. Now with SAH and infarcts, Neuro consulting, Xarelto stopped Blood Cx now with Strep viridans, suspect IE, TEE tomorrow , ID consulted  Assessment/Plan:  CVA -MRI w/small SAH, punctate infarcts at the watershed area -Xarelto stopped -MRA - negative -CTA head and neck - no cerebral venous thrombosis, AVM, or aneurysm. -2D Echo - EF 60-65% in 12/2015 -LDL - 107, HgbA1c 8.4  Infective endocarditis Streptococcus viridans bacteremia with TEE findings of infective endocarditis. Cardiology to evaluate, likely will need CT surgery evaluation for mitral regurgitation.  Strep Viridans bacteremia -In the setting of CVA and splenic infarct, showed endocarditis. 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  Acute respiratory failure with hypoxia Appears to be secondary to acute on chronic diastolic CHF, this is because of discontinuing Lasix and started on IV fluids. IV fluids pain off since the day before yesterday. Start on IV Lasix, obtain chest x-ray.  Acute on chronic diastolic CHF -Now decompensated, was on IV fluids and Lasix/Aldactone on hold. -Patient developed hypoxia, restarted on Lasix. 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  DM -A1c 8.4 -continue lantus, SSI  HTN -stable  Dyslipidemia -On pravastatin  Diarrhea -Resolved  Morbid obesity  DVT proph: SCDs  Code Status: Full COde Family Communication: none at bedside Disposition Plan: home pending workup   Consultants: Neuro ID  Objective: Filed Vitals:   02/06/16 1036 02/06/16 1039  BP: 175/150 109/45  Pulse: 111 111  Temp: 98.1 F (36.7 C) 98.3 F (36.8 C)  Resp: 20 20    Intake/Output Summary (Last 24 hours) at 02/06/16 1213 Last data filed at 02/05/16 1800  Gross per 24 hour  Intake    100 ml  Output    350 ml  Net   -250 ml   Filed Weights   02/01/16 1200 02/02/16 1904  Weight: 125.4 kg (276 lb 7.3 oz) 124.013 kg (273 lb 6.4 oz)    Exam:   General:AAOx3, morbidly obese, no distress but uncomfortable  Cardiovascular: S1S2/RRR, systolic murmur  Respiratory: diminished BS at bases  Abdomen: soft, Nt, BS present  Musculoskeletal: no edema, tenderness around both toes  Data Reviewed: Basic Metabolic Panel:  Recent Labs Lab 01/31/16 2232 01/31/16 2233 02/03/16 0032 02/05/16 0322  NA 131* 132* 134* 134*  K 4.9 4.6 4.8 4.3  CL 94* 94* 98* 99*  CO2 26  --  26 25  GLUCOSE 215* 211* 198* 204*  BUN 13 15 20 10   CREATININE 1.13* 1.10* 0.92 0.91  CALCIUM 10.2  --  11.0* 10.2   Liver Function Tests:  Recent Labs Lab 01/31/16 2232 02/05/16 0322  AST 18 15  ALT 12* 14  ALKPHOS 75 76  BILITOT 0.5 0.7  PROT 7.2 6.9  ALBUMIN 3.1* 2.8*    Recent Labs Lab 02/05/16 0322  LIPASE 76*   No results for input(s): AMMONIA in the last 168 hours. CBC:  Recent Labs Lab 01/31/16 2232 01/31/16 2233 02/03/16 1110 02/05/16 0322  WBC 17.9*  --  15.5* 14.2*  NEUTROABS 15.2*  --   --  10.9*  HGB 13.0 14.6 12.3 12.9  HCT 40.9 43.0 41.1 41.5  MCV 87.8  --  89.5 88.3  PLT 478*  --  474* 398   Cardiac Enzymes:  Recent Labs Lab 02/01/16 1442  02/01/16 1943 02/05/16 0322 02/05/16 0802 02/05/16 1448  TROPONINI 0.03 0.05* 0.04* 0.04* 0.05*   BNP (last 3 results) No results for input(s): BNP in the last 8760 hours.  ProBNP (last 3 results) No results for input(s): PROBNP in the last 8760 hours.  CBG:  Recent Labs Lab 02/05/16 1325 02/05/16 1450 02/05/16 1658 02/05/16 2138 02/06/16 0753  GLUCAP 172* 159* 145* 147* 177*    Recent Results (from the past 240 hour(s))  Culture, blood (Routine X 2) w Reflex to ID Panel     Status: None   Collection Time: 02/01/16  7:45 AM  Result Value Ref Range Status   Specimen Description BLOOD RIGHT ARM  Final   Special Requests BOTTLES DRAWN AEROBIC AND ANAEROBIC 10CC  Final   Culture  Setup Time   Final    GRAM POSITIVE COCCOBACILLUS IN BOTH AEROBIC AND ANAEROBIC BOTTLES CRITICAL RESULT CALLED TO, READ BACK BY AND VERIFIED WITH: C SOSA 02/02/16 @ 1014 M VESTAL    Culture   Final    VIRIDANS STREPTOCOCCUS SUSCEPTIBILITIES PERFORMED ON PREVIOUS CULTURE WITHIN THE LAST 5 DAYS.    Report Status 02/04/2016 FINAL  Final  Culture, blood (Routine X 2) w Reflex to ID Panel     Status: None   Collection Time: 02/01/16  8:00 AM  Result Value Ref Range Status   Specimen Description BLOOD LEFT HAND  Final   Special Requests BOTTLES DRAWN AEROBIC AND ANAEROBIC 10CC  Final   Culture  Setup Time   Final    GRAM POSITIVE COCCOBACILLUS IN BOTH AEROBIC AND ANAEROBIC BOTTLES CRITICAL RESULT CALLED TO, READ BACK BY AND VERIFIED WITH: C SOSA 02/02/16 @ 1014 M VESTAL    Culture VIRIDANS STREPTOCOCCUS  Final   Report Status 02/04/2016 FINAL  Final   Organism ID, Bacteria VIRIDANS STREPTOCOCCUS  Final      Susceptibility   Viridans streptococcus - MIC*    PENICILLIN <=0.06 SENSITIVE Sensitive     CEFTRIAXONE <=0.12 SENSITIVE Sensitive     ERYTHROMYCIN <=0.12 SENSITIVE Sensitive     LEVOFLOXACIN 0.5 SENSITIVE Sensitive     VANCOMYCIN 0.5 SENSITIVE Sensitive     * VIRIDANS STREPTOCOCCUS  MRSA  PCR Screening     Status: None   Collection Time: 02/01/16 12:54 PM  Result Value Ref Range Status   MRSA by PCR NEGATIVE NEGATIVE Final    Comment:        The GeneXpert MRSA Assay (FDA approved for NASAL specimens only), is one component of a comprehensive MRSA colonization surveillance program. It is not intended to diagnose MRSA infection nor to guide or monitor treatment for MRSA infections.   Culture, blood (routine x 2)     Status: None (Preliminary result)   Collection Time: 02/04/16 12:36 PM  Result Value Ref Range Status   Specimen Description BLOOD  Final   Special Requests IN PEDIATRIC BOTTLE 3CC  Final   Culture NO GROWTH 2  DAYS  Final   Report Status PENDING  Incomplete  Culture, blood (routine x 2)     Status: None (Preliminary result)   Collection Time: 02/04/16 12:48 PM  Result Value Ref Range Status   Specimen Description BLOOD  Final   Special Requests IN PEDIATRIC BOTTLE 3CC  Final   Culture NO GROWTH 2 DAYS  Final   Report Status PENDING  Incomplete     Studies: Ct Abdomen W Contrast  02/05/2016  CLINICAL DATA:  60 year old female inpatient with epigastric/left abdominal pain. Splenic infarction on recent CT. EXAM: CT ABDOMEN WITH CONTRAST TECHNIQUE: Multidetector CT imaging of the abdomen was performed using the standard protocol following bolus administration of intravenous contrast. CONTRAST:  100 cc Omnipaque 300 IV. COMPARISON:  01/14/2016 CT abdomen/ pelvis. FINDINGS: Lower chest: No significant pulmonary nodules or acute consolidative airspace disease. There is stable mosaic attenuation at the lung bases. Stable trace layering right pleural effusion. Hepatobiliary: Diffuse hepatic steatosis. No liver mass. No definite liver surface irregularity. Normal gallbladder with no radiopaque cholelithiasis. No biliary ductal dilatation. Pancreas: Normal, with no mass or duct dilation. Spleen: There is new mild splenomegaly (craniocaudal splenic length 14.9 cm,  increased from 12.6 cm on 02/29/2017). There is been continued evolution of the previously described splenic infarct involving the upper 50% of the spleen, which is now all fluid density and distended. There is a new large splenic infarct in the lower 50% of the spleen. Only approximately 25% of the splenic parenchyma remains perfused. Splenic artery and celiac trunk appear patent. Adrenals/Urinary Tract: Normal adrenals. Simple 2.1 cm anterior upper and 1.5 cm lower left renal cysts. No hydronephrosis. No new renal lesions. Stomach/Bowel: Grossly normal stomach. Visualized small and large bowel is normal caliber, with no bowel wall thickening. Vascular/Lymphatic: Atherosclerotic nonaneurysmal abdominal aorta. Patent portal, splenic, hepatic and renal veins. No pathologically enlarged lymph nodes in the abdomen. Other: No pneumoperitoneum, ascites or focal fluid collection. Musculoskeletal: Re- demonstrated are endplate erosions at T8-9 (series 6/ image 125), not appreciably changed since 01/14/2016, although new since 06/18/2012. Moderate degenerative changes in the visualized thoracolumbar spine. IMPRESSION: 1. 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 Evolution of the previously described splenic infarct involving the upper 50% of the spleen. Spleen is now mildly enlarged. Cardiac embolic source remains the most likely etiology. Splenic artery and vein appear patent. 2. Stable endplate erosions at T8-9, new since 2013, nonspecific. Consider further evaluation with thoracic spine MRI. 3. Diffuse hepatic steatosis. 4. Mosaic attenuation at the lung bases, favor mosaic perfusion due to chronic pulmonary arterial hypertension. Electronically Signed   By: Delbert Phenix M.D.   On: 02/05/2016 08:16    Scheduled Meds: . albuterol  2.5 mg Nebulization QHS  . ampicillin (OMNIPEN) IV  2 g Intravenous 6 times per day  . antiseptic oral rinse  7 mL Mouth Rinse BID  . insulin aspart   0-9 Units Subcutaneous TID WC  . insulin glargine  5 Units Subcutaneous QHS  . pravastatin  20 mg Oral q1800  . sodium chloride flush  3 mL Intravenous Q12H   Continuous Infusions:   Antibiotics Given (last 72 hours)    Date/Time Action Medication Dose Rate   02/03/16 1242 Given   cefTRIAXone (ROCEPHIN) 1 g in dextrose 5 % 50 mL IVPB 1 g 100 mL/hr   02/04/16 1050 Given   cefTRIAXone (ROCEPHIN) 2 g in dextrose 5 % 50 mL IVPB 2 g 100 mL/hr  02/05/16 1100 Given   cefTRIAXone (ROCEPHIN) 2 g in dextrose 5 % 50 mL IVPB 2 g 100 mL/hr   02/06/16 1038 Given   cefTRIAXone (ROCEPHIN) 2 g in dextrose 5 % 50 mL IVPB 2 g 100 mL/hr      Principal Problem:   Bacteremia Active Problems:   HTN (hypertension)   COPD (chronic obstructive pulmonary disease) (HCC)   Chronic diastolic CHF (congestive heart failure) (HCC)   Morbid obesity (HCC)   Insulin dependent diabetes mellitus (HCC)   Essential hypertension   Hepatic cirrhosis (HCC)   Splenic infarct   Pulmonary hypertension assoc with unclear multi-factorial mechanisms (HCC)   Subarachnoid hemorrhage (HCC)   Leukocytosis   Diarrhea   Hyponatremia   Subarachnoid bleed (HCC)   Stroke (HCC)   Visual changes   Cerebrovascular accident (CVA) due to embolism of cerebral artery (HCC)   Streptococcus viridans infection   Epigastric abdominal tenderness on direct palpation   Bacterial endocarditis   Positive ANA (antinuclear antibody)    Time spent:    [facility]  [facility] Pager [phone]. If 7PM-7AM, please contact night-coverage at [email or web address], password [facility] 02/06/2016, 12:13 PM  LOS: 5 days

## 2016-02-06 NOTE — Anesthesia Procedure Notes (Addendum)
Procedure Name: Intubation Date/Time: 02/06/2016 6:34 PM Performed by: Layla Maw Pre-anesthesia Checklist: Patient identified, Patient being monitored, Timeout performed, Emergency Drugs available and Suction available Patient Re-evaluated:Patient Re-evaluated prior to inductionOxygen Delivery Method: Circle System Utilized Preoxygenation: Pre-oxygenation with 100% oxygen Intubation Type: IV induction, Rapid sequence and Cricoid Pressure applied Laryngoscope Size: 3 and Mac Grade View: Grade II Tube type: Oral Tube size: 7.5 mm Number of attempts: 1 Airway Equipment and Method: Stylet Placement Confirmation: ETT inserted through vocal cords under direct vision,  positive ETCO2 and breath sounds checked- equal and bilateral Secured at: 21 cm Tube secured with: Tape Dental Injury: Teeth and Oropharynx as per pre-operative assessment

## 2016-02-06 NOTE — Transfer of Care (Signed)
Immediate Anesthesia Transfer of Care Note  Patient: Margaret [name]  Procedure(s) Performed: Procedure(s): RADIOLOGY WITH ANESTHESIA (N/A)  Patient Location: ICU  Anesthesia Type:General  Level of Consciousness: sedated and Patient remains intubated per anesthesia plan  Airway & Oxygen Therapy: Patient remains intubated per anesthesia plan and Patient placed on Ventilator (see vital sign flow sheet for setting)  Post-op Assessment: Report given to RN and Post -op Vital signs reviewed and stable  Post vital signs: Reviewed and stable  Last Vitals:  Filed Vitals:   02/06/16 1659 02/06/16 2200  BP: 133/60 141/111  Pulse: 93 90  Temp:  36.4 C  Resp:  17    Complications: No apparent anesthesia complications

## 2016-02-06 NOTE — Progress Notes (Addendum)
Was called to pt room at  16:00 by husband stated that pt was having a panic attack. Entered room and patient was moaning and groaning, was drooling from the left side of her mouth with left sided droop of mouth and slurring words. Rapid response was called and Dr. Hartford Poli paged. He paged neuro. VS normal. CBG 183. Pt not able to lift left arm or wiggle toes on left foot. Bolus started O2 98% on 3L. Pt slurring out pain on left side at axillary. Pt to be transferred

## 2016-02-06 NOTE — Consult Note (Signed)
CARDIOLOGY CONSULT NOTE   Patient ID: Margaret [name] MRN: [id number] DOB/AGE: Feb 03, 1956 60 y.o.  Admit date: 02/01/2016  Primary Physician   Harvie Junior, MD Primary Cardiologist   New Reason for Consultation  Endocarditis with MR  Requesting Physician  Dr. Hartford Poli  HPI: Margaret [name] is a 60 y.o. female with a history of HTN, HL, COPD, DM, anemia and recent admission due to acute splenic infraction who was presented to [facility] Ed 02/01/16 with acute onset of blood vision with headache, diarrhea and lightheadedness. Workup showed SAH.   Recently admitted 01/14/16-01/16/16 for an acute splenic infarction without clear etiology or source of embolus. At that time lower extremity Doppler showed no evidence of DVT.  Echocardiogram showed EF of 60-65 percent and grade 2 diastolic dysfunction and no cardiac source of emboli. Patient was started on heparin drip and transitioned to xarelto with plan to obtain outpatient TEE. She was discharged on aspirin and Xarelto.   March 2,2017 -  patient was seen by hematology oncology who felt the splenic infarct most likely was secondary to splenic congestion from liver cirrhosis in combination of peripheral vascular disease. The plan was for her to remain on anticoagulation for next 6 months.  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   Presented to ED 02/01/16  with vision changes. Work up reveled small [place] within the left parieto-occipital region, and 4 punctate infarcts at the watershed area including left MCA/PCA, bilateral MCA/ACA. Blood culture grown as strep viridans and evaluated by ID who started on Abx. Subsequently TEE 02/05/16 showed Mitral valve endocarditis involving posterior leaflet with severe, anterioly-directed mitral regurgitation. Repeat blood cultures negative to date. Patient fell earlier today 02/06/16 after she slipped in her urine. Transient  slurred speech and hypoxia. Repeart CT of head this morning showed no acute abnormality.   She is very lethargic during interview. Answers question by yes and no then goes back to sleep. Most of hx obtained from chart. Currently denies chest pain and sob. She has no history of IV drug use.She reports no history of prosthetic heart valve, prosthetic joints, or other removable device.  EKG on admission showed sinus rhythm without acute changes.   Past Medical History  Diagnosis Date  . Hypertension   . Hyperlipidemia   . COPD (chronic obstructive pulmonary disease) ([place]) 04/20/12  . Asthma   . Type II diabetes mellitus ([place])   . Anemia   . H/O hiatal hernia   . Osteoarthritis   . Pneumonia 04/2012  . Migraines   . Panic attacks 04/20/12  . Morbid obesity ([place])   . Heart murmur      Past Surgical History  Procedure Laterality Date  . Laceration repair  1966    "right upper arm"  . Dilation and curettage of uterus  1990's  . Finger reattachment  1973    "right ring finger"    Allergies  Allergen Reactions  . Codeine Hives, Itching and Other (See Comments)    "breathing problems"  . Latex Itching    I have reviewed the patient's current medications . albuterol  2.5 mg Nebulization QHS  . ampicillin (OMNIPEN) IV  2 g Intravenous 6 times per day  . antiseptic oral rinse  7 mL Mouth Rinse BID  . insulin aspart  0-9 Units Subcutaneous TID WC  . insulin glargine  5 Units Subcutaneous QHS  . LORazepam      .  LORazepam  1 mg Intravenous Once  . pravastatin  20 mg Oral q1800  . sodium chloride flush  3 mL Intravenous Q12H     HYDROcodone-acetaminophen, hydrOXYzine, ondansetron, sodium chloride flush, traMADol  Prior to Admission medications   Medication Sig Start Date End Date Taking? Authorizing Provider  albuterol (PROVENTIL HFA;VENTOLIN HFA) 108 (90 BASE) MCG/ACT inhaler Inhale 2 puffs into the lungs every 4 (four) hours as needed. For shortness of breath. 04/30/12  Yes  Shanker Kristeen Mans, MD  albuterol (PROVENTIL) (2.5 MG/3ML) 0.083% nebulizer solution Take 2.5 mg by nebulization at bedtime.    Yes Historical Provider, MD  Alogliptin-Metformin HCl 12.03-999 MG TABS Take 1 tablet by mouth 2 (two) times daily.   Yes Historical Provider, MD  aspirin 81 MG chewable tablet Chew 81 mg by mouth daily.   Yes Historical Provider, MD  atenolol (TENORMIN) 50 MG tablet Take 12.5 mg by mouth 2 (two) times daily.    Yes Historical Provider, MD  cholecalciferol (VITAMIN D) 1000 units tablet Take 1,000 Units by mouth 2 (two) times daily.   Yes Historical Provider, MD  furosemide (LASIX) 40 MG tablet Take 40 mg by mouth daily.    Yes Historical Provider, MD  ibuprofen (ADVIL,MOTRIN) 200 MG tablet Take 600 mg by mouth every 6 (six) hours as needed for headache or mild pain.   Yes Historical Provider, MD  insulin NPH-regular (NOVOLIN 70/30) (70-30) 100 UNIT/ML injection Inject 6-16 Units into the skin 2 (two) times daily with a meal.   Yes Historical Provider, MD  loperamide (IMODIUM) 2 MG capsule Take 2 mg by mouth daily as needed for diarrhea or loose stools.   Yes Historical Provider, MD  lovastatin (MEVACOR) 20 MG tablet Take 10 mg by mouth at bedtime.   Yes Historical Provider, MD  naproxen sodium (ANAPROX) 220 MG tablet Take 220 mg by mouth 2 (two) times daily as needed (pain).   Yes Historical Provider, MD  nitroGLYCERIN (NITROSTAT) 0.4 MG SL tablet Place 1 tablet (0.4 mg total) under the tongue every 5 (five) minutes x 3 doses as needed for chest pain. 06/20/12 02/01/16 Yes Jettie Booze, MD  oxyCODONE-acetaminophen (PERCOCET) 10-325 MG tablet Take 1 tablet by mouth 2 (two) times daily as needed for pain.   Yes Historical Provider, MD  Potassium 99 MG TABS Take 99 mg by mouth every morning.   Yes Historical Provider, MD  Rivaroxaban (XARELTO STARTER PACK) 15 & 20 MG TBPK Take as directed on package: Start with one 15mg  tablet by mouth twice a day with food. On Day 22,  switch to one 20mg  tablet once a day with food. 01/16/16  Yes Maryann Mikhail, DO  spironolactone (ALDACTONE) 25 MG tablet Take 50 mg by mouth daily.    Yes Historical Provider, MD  ciprofloxacin (CIPRO) 500 MG tablet Take 1 tablet (500 mg total) by mouth 2 (two) times daily. Patient not taking: Reported on 02/01/2016 01/16/16   Velta Addison Mikhail, DO  metroNIDAZOLE (FLAGYL) 500 MG tablet Take 1 tablet (500 mg total) by mouth every 8 (eight) hours. Patient not taking: Reported on 02/01/2016 01/16/16   Cristal Ford, DO     Social History   Social History  . Marital Status: Married    Spouse Name: N/A  . Number of Children: N/A  . Years of Education: N/A   Occupational History  . Not on file.   Social History Main Topics  . Smoking status: Former Smoker -- 0.20 packs/day for 40 years  Types: Cigarettes    Quit date: 04/20/2012  . Smokeless tobacco: Never Used     Comment: 6/5//13 "a pack of cigarettes would last me 1 - 1 1/2 wk"  . Alcohol Use: No  . Drug Use: No  . Sexual Activity: Not Currently     Comment: married, 2 children, work with computers   Other Topics Concern  . Not on file   Social History Narrative    Family Status  Relation Status Death Age  . Mother Deceased   . Father Deceased   . Maternal Grandmother Deceased    Family History  Problem Relation Age of Onset  . Allergies    . Hypertension    . Coronary artery disease Mother 81  . Stroke Father   . COPD Father   . Clotting disorder Maternal Grandmother     died from blood clot       ROS:  Full 14 point review of systems complete and found to be negative unless listed above.  Physical Exam: Blood pressure 109/45, pulse 111, temperature 98.3 F (36.8 C), temperature source Oral, resp. rate 20, height 5\' 5"  (1.651 m), weight 273 lb 6.4 oz (124.013 kg), SpO2 82 %.  General: Obese  female in no acute distress. Lethargic and answers question by yes and no.  Head: Eyes PERRLA, No xanthomas.  Normocephalic and atraumatic, oropharynx without edema or exudate.  Lungs: Resp regular and unlabored, CTA anteriority.  Heart: RRR no s3, s4. Systolic murmurs.   Neck: No carotid bruits. No lymphadenopathy.  No JVD. Abdomen: Bowel sounds present, abdomen soft and non-tender without masses or hernias noted. Msk:  No spine or cva tenderness. No weakness, no joint deformities or effusions. Extremities: No clubbing, cyanosis or edema. DP/PT/Radials 2+ and equal bilaterally. Neuro: Lethargic. No focal deficits noted. Psych:  lethargic Skin: No rashes or lesions noted.  Labs:   Lab Results  Component Value Date   WBC 14.2* 02/05/2016   HGB 12.9 02/05/2016   HCT 41.5 02/05/2016   MCV 88.3 02/05/2016   PLT 398 02/05/2016   No results for input(s): INR in the last 72 hours.  Recent Labs Lab 02/05/16 0322  NA 134*  K 4.3  CL 99*  CO2 25  BUN 10  CREATININE 0.91  CALCIUM 10.2  PROT 6.9  BILITOT 0.7  ALKPHOS 76  ALT 14  AST 15  GLUCOSE 204*  ALBUMIN 2.8*   MAGNESIUM  Date Value Ref Range Status  04/27/2012 1.7 1.5 - 2.5 mg/dL Final    Recent Labs  02/05/16 0322 02/05/16 0802 02/05/16 1448  TROPONINI 0.04* 0.04* 0.05*   No results for input(s): TROPIPOC in the last 72 hours. PRO B NATRIURETIC PEPTIDE (BNP)  Date/Time Value Ref Range Status  10/09/2013 12:13 PM 330.5* 0 - 125 pg/mL Final  06/15/2012 06:10 PM 280.0* 0 - 125 pg/mL Final   Lab Results  Component Value Date   CHOL 155 02/02/2016   HDL 21* 02/02/2016   LDLCALC 107* 02/02/2016   TRIG 137 02/02/2016   Lab Results  Component Value Date   DDIMER 2.30* 06/18/2012   LIPASE  Date/Time Value Ref Range Status  02/05/2016 03:22 AM 76* 11 - 51 U/L Final   TSH  Date/Time Value Ref Range Status  04/27/2012 03:54 PM 2.190 0.350 - 4.500 uIU/mL Final   No results found for: VITAMINB12, FOLATE, FERRITIN, TIBC, IRON, RETICCTPCT  Echo: 01/14/16 LV EF: 60% -  65%  ------------------------------------------------------------------- Indications: Dyspnea 786.09.  ------------------------------------------------------------------- History:  PMH: Splenic Infarct. Chronic obstructive pulmonary disease. Risk factors: Hypertension. Diabetes mellitus. Dyslipidemia.  ------------------------------------------------------------------- Study Conclusions  - Left ventricle: The cavity size was normal. There was mild  concentric hypertrophy. Systolic function was normal. The  estimated ejection fraction was in the range of 60% to 65%. Wall  motion was normal; there were no regional wall motion  abnormalities. Features are consistent with a pseudonormal left  ventricular filling pattern, with concomitant abnormal relaxation  and increased filling pressure (grade 2 diastolic dysfunction).  Doppler parameters are consistent with high ventricular filling  pressure. - Mitral valve: Calcified annulus. There was mild regurgitation. - Atrial septum: No defect or patent foramen ovale was identified. - Pulmonic valve: There was trivial regurgitation.  Impressions:  - No cardiac source of emboli was indentified.  Procedure: TEE 02/05/16  Indication: Endocarditis  Sedation: Versed 2 mg IV, Fentanyl 25 mcg IV  Findings: Please see echo section for full report. Normal LV size with EF 60-65%. Normal wall motion. Normal RV size and systolic function. Trivial TR, no TV vegetation. No PV vegetation. Trileaflet aortic valve with no stenosis or regurgitation, no vegetation. There was a mobile vegetation on the posterior leaflet of the mitral valve measuring about 2 cm in greatest dimension. The posterior and anterior leaflets of the MV did not coapt properly and there was very eccentric, anteriorly-directed mitral regurgitation. I suspect severe MR. Jet was too eccentric for PISA calculations. There was systolic flattening but not flow  reversal in the pulmonary vein doppler pattern. The left atrium was mildly dilated, no LA appendage thrombus. Normal right atrium. No evidence for PFO or ASD. Normal caliber aorta with mild plaque in the descending thoracic aorta.   Impression: Mitral valve endocarditis involving posterior leaflet with severe, anterioly-directed mitral regurgitation  ECG:  Vent. rate 66 BPM PR interval 174 ms QRS duration 96 ms QT/QTc 412/431 ms P-R-T axes 56 60 61  Radiology:  Ct Head Wo Contrast  02/06/2016  CLINICAL DATA:  Slip and fall today with slurred speech and hypoxia EXAM: CT HEAD WITHOUT CONTRAST TECHNIQUE: Contiguous axial images were obtained from the base of the skull through the vertex without intravenous contrast. COMPARISON:  02/01/2016 FINDINGS: Bony calvarium is intact. No findings to suggest acute hemorrhage, acute infarction or space-occupying mass lesion is seen. Some gyral swelling is noted similar to that seen on the prior exam in the left posterior parietal region. This is stable from the prior exam. No acute abnormality is noted. IMPRESSION: No significant change from the prior exam. No acute hemorrhage is noted. Electronically Signed   By: Inez Catalina M.D.   On: 02/06/2016 14:09   Ct Abdomen W Contrast  02/05/2016  CLINICAL DATA:  60 year old female inpatient with epigastric/left abdominal pain. Splenic infarction on recent CT. EXAM: CT ABDOMEN WITH CONTRAST TECHNIQUE: Multidetector CT imaging of the abdomen was performed using the standard protocol following bolus administration of intravenous contrast. CONTRAST:  100 cc Omnipaque 300 IV. COMPARISON:  01/14/2016 CT abdomen/ pelvis. FINDINGS: Lower chest: No significant pulmonary nodules or acute consolidative airspace disease. There is stable mosaic attenuation at the lung bases. Stable trace layering right pleural effusion. Hepatobiliary: Diffuse hepatic steatosis. No liver mass. No definite liver surface irregularity. Normal  gallbladder with no radiopaque cholelithiasis. No biliary ductal dilatation. Pancreas: Normal, with no mass or duct dilation. Spleen: There is new mild splenomegaly (craniocaudal splenic length 14.9 cm, increased from 12.6 cm on 02/29/2017). There is been continued evolution of the previously described splenic  infarct involving the upper 50% of the spleen, which is now all fluid density and distended. There is a new large splenic infarct in the lower 50% of the spleen. Only approximately 25% of the splenic parenchyma remains perfused. Splenic artery and celiac trunk appear patent. Adrenals/Urinary Tract: Normal adrenals. Simple 2.1 cm anterior upper and 1.5 cm lower left renal cysts. No hydronephrosis. No new renal lesions. Stomach/Bowel: Grossly normal stomach. Visualized small and large bowel is normal caliber, with no bowel wall thickening. Vascular/Lymphatic: Atherosclerotic nonaneurysmal abdominal aorta. Patent portal, splenic, hepatic and renal veins. No pathologically enlarged lymph nodes in the abdomen. Other: No pneumoperitoneum, ascites or focal fluid collection. Musculoskeletal: Re- demonstrated are endplate erosions at 579FGE (series 6/ image 125), not appreciably changed since 01/14/2016, although new since 06/18/2012. Moderate degenerative changes in the visualized thoracolumbar spine. IMPRESSION: 1. 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 Evolution of the previously described splenic infarct involving the upper 50% of the spleen. Spleen is now mildly enlarged. Cardiac embolic source remains the most likely etiology. Splenic artery and vein appear patent. 2. Stable endplate erosions at 579FGE, new since 2013, nonspecific. Consider further evaluation with thoracic spine MRI. 3. Diffuse hepatic steatosis. 4. Mosaic attenuation at the lung bases, favor mosaic perfusion due to chronic pulmonary arterial hypertension. Electronically Signed   By: Ilona Sorrel M.D.   On:  02/05/2016 08:16    ASSESSMENT AND PLAN:      KATAVIA FIKE is a 60 y.o. female with a history of HTN, HL, COPD, DM, anemia and recent admission due to acute splenic infraction who was presented to [facility] 02/01/16 with acute onset of blood vision with headache, diarrhea and lightheadedness. Work up reveled small [place] within the left parieto-occipital region, and 4 punctate infarcts at the watershed area including left MCA/PCA, bilateral MCA/ACA. Streptococcus viridans bacteremia with TEE findings of infective endocarditis.   Principal Problem:   Bacteremia Active Problems:   HTN (hypertension)   COPD (chronic obstructive pulmonary disease) (HCC)   Chronic diastolic CHF (congestive heart failure) (HCC)   Morbid obesity (HCC)   Insulin dependent diabetes mellitus ([place])   Essential hypertension   Hepatic cirrhosis (HCC)   Splenic infarct   Pulmonary hypertension assoc with unclear multi-factorial mechanisms (HCC)   Subarachnoid hemorrhage (HCC)   Leukocytosis   Diarrhea   Hyponatremia   Subarachnoid bleed (HCC)   Stroke (HCC)   Visual changes   Cerebrovascular accident (CVA) due to embolism of cerebral artery (HCC)   Streptococcus viridans infection   Epigastric abdominal tenderness on direct palpation   Bacterial endocarditis   Positive ANA (antinuclear antibody)  Plan: Strep viridans native mitral valve endocarditis leading to likely splenic & cerebral infarct. DC xarelto due to [facility]. Avoid antiplatelet or anticoagulation. MD to see. Likely CT surgery consult. Abx per ID.   SignedLeanor Kail, PA 02/06/2016, 2:19 PM Pager [phone]  Co-Sign MD  The patient was seen, examined and discussed with Bhagat,Bhavinkumar PA-C and I agree with the above.   60 year old female with a history of HTN, HL, COPD, DM, anemia and recent admission due to acute splenic infraction who was presented to [facility] 02/01/16 with acute onset of blood vision with headache, diarrhea and  lightheadedness. Work up reveled small [place] within the left parieto-occipital region, and 4 punctate infarcts at the watershed area including left MCA/PCA, bilateral MCA/ACA. Streptococcus viridans bacteremia with TEE findings of infective endocarditis. TEE showed preserved left ventricular ejection fraction 60-65%.  There is a large mobile education attached to the posterior leaflet of the mitral valve measuring 2 cm creating non-coaptation of the mitral valve leaflets resulting in eccentric anteriorly directed mitral regurgitation most probably with severe MR. Considering the patient has a large vegetation causing multiple embolic events including stroke and splenic infarcts she should most probably undergo mitral valve surgery. While examining the patient she is very somnolent, patient's husband explaining that she has received several narcotics for splenic pain and also for claustrophobia in the MRI machine. CT head earlier today showed no significant change from prior exam. However patient found to have hemiparesis, rapid response was called, her further management depends on neurologic recovery. Dr Roxy Manns saw her as a consult.  Margaret [name] 02/06/2016

## 2016-02-06 NOTE — Anesthesia Preprocedure Evaluation (Signed)
Anesthesia Evaluation  Patient identified by MRN, date of birth, ID band Patient awake    Reviewed: Allergy & Precautions, NPO status , Patient's Chart, lab work & pertinent test results  Airway Mallampati: II  TM Distance: >3 FB Neck ROM: Full    Dental   Pulmonary shortness of breath, asthma , pneumonia, COPD, former smoker,    breath sounds clear to auscultation       Cardiovascular hypertension, + Peripheral Vascular Disease and +CHF   Rhythm:Regular Rate:Normal     Neuro/Psych    GI/Hepatic Neg liver ROS, hiatal hernia,   Endo/Other  diabetes  Renal/GU negative Renal ROS     Musculoskeletal  (+) Arthritis ,   Abdominal   Peds  Hematology   Anesthesia Other Findings   Reproductive/Obstetrics                             Anesthesia Physical Anesthesia Plan  ASA: III  Anesthesia Plan: General   Post-op Pain Management:    Induction: Intravenous, Rapid sequence and Cricoid pressure planned  Airway Management Planned: Oral ETT  Additional Equipment:   Intra-op Plan:   Post-operative Plan: Post-operative intubation/ventilation  Informed Consent: I have reviewed the patients History and Physical, chart, labs and discussed the procedure including the risks, benefits and alternatives for the proposed anesthesia with the patient or authorized representative who has indicated his/her understanding and acceptance.   Dental advisory given  Plan Discussed with: CRNA and Anesthesiologist  Anesthesia Plan Comments:         Anesthesia Quick Evaluation

## 2016-02-06 NOTE — Progress Notes (Signed)
Called at 1100 to assist with patient with fall and decreased LOC.  On arrival patient supine in bed= warm and dry - pale.  Opens eyes to loud calling of name - will answer some questions appropriately - follows simple commands - pupils [id number] - moves all extremities - states she has a headache and is sleepy.  Speech clear. No signs of trauma noted to head or face - patient states she did not hit head.   CBG 216.Marland Kitchen BP 109/45 HR 111 ST - resps 16-20 O2 sats 82% - denies CP or SOB - states "i just want to sleep".  Placed on 4 liters nasal cannula - sats increasing.  Dr. Hartford Poli to room.  Patient more alert - reciting entire event of the morning to doctor - speech clear - follows commands - eyes open.  Handoff the [employer] - call as needed.

## 2016-02-06 NOTE — Progress Notes (Signed)
STROKE TEAM PROGRESS NOTE   SUBJECTIVE (INTERVAL HISTORY) Called by Dr. Hartford Poli that pt had fall earlier today at 11am. No focal neuro deficit at that time. Attempted for MRI not able but received ativan 2mg . Had plain CT showed no acute abnormality. Dr. Hartford Poli examined her when she came back from CT at 2:10pm and she was neuro intact. Since then, she was drowsy due to ativan. However, husband found her to have left facial droop, slurry speech, left hemiplegia, right gaze and left neglect at about 4pm. Rapid response called, NIHSS = 19. Not able to established IV assess due to agitation and noncooperation. Had again CT stat head showed no ICH or SAH. Emergent IR call to Dr. Estanislado Pandy. Pt went for cerebral angio diagnostic.  OBJECTIVE Temp:  [98.1 F (36.7 C)-99.7 F (37.6 C)] 98.6 F (37 C) (03/16 1559) Pulse Rate:  [90-135] 93 (03/16 1659) Cardiac Rhythm:  [-]  Resp:  [16-20] 20 (03/16 1559) BP: (97-175)/(45-150) 133/60 mmHg (03/16 1659) SpO2:  [80 %-100 %] 98 % (03/16 1659)  CBC:   Recent Labs Lab 01/31/16 2232  02/03/16 1110 02/05/16 0322  WBC 17.9*  --  15.5* 14.2*  NEUTROABS 15.2*  --   --  10.9*  HGB 13.0  < > 12.3 12.9  HCT 40.9  < > 41.1 41.5  MCV 87.8  --  89.5 88.3  PLT 478*  --  474* 398  < > = values in this interval not displayed.  Basic Metabolic Panel:   Recent Labs Lab 02/03/16 0032 02/05/16 0322  NA 134* 134*  K 4.8 4.3  CL 98* 99*  CO2 26 25  GLUCOSE 198* 204*  BUN 20 10  CREATININE 0.92 0.91  CALCIUM 11.0* 10.2    Lipid Panel:     Component Value Date/Time   CHOL 155 02/02/2016 1510   TRIG 137 02/02/2016 1510   HDL 21* 02/02/2016 1510   CHOLHDL 7.4 02/02/2016 1510   VLDL 27 02/02/2016 1510   LDLCALC 107* 02/02/2016 1510   HgbA1c:  Lab Results  Component Value Date   HGBA1C 8.4* 02/02/2016   Urine Drug Screen: No results found for: LABOPIA, COCAINSCRNUR, LABBENZ, AMPHETMU, THCU, LABBARB    IMAGING I have personally reviewed the  radiological images below and agree with the radiology interpretations.  Dg Chest 2 View 02/01/2016    Cardiomegaly and pulmonary venous congestion.   Ct Head Wo Contrast 02/01/2016   Localized subarachnoid hemorrhage/debris in the left parietal occipital region as discussed above. MRI/MRA recommended.   Mri and Mra Head Wo Contrast 02/01/2016   1. Small volume subarachnoid hemorrhage/debris within the left parieto-occipital region, corresponding to abnormality seen on prior CT. Minimal gyral swelling within this region without significant mass effect or other abnormality.  2. Few sub cm cortical/subcortical ischemic infarcts within the bilateral parietal lobes as above.  3. Negative intracranial MRA.   CTA of head and neck 02/01/2016 1. Mild intracranial and extracranial atherosclerosis without significant stenosis. 2. Patent dural venous sinuses. No vascular malformation identified.  LE venous doppler - Bilateral: No evidence of DVT, superficial thrombosis, or Baker's Cyst.  EEG - Unremarkable awake and drowsy routine inpatient EEG. Clinical correlation is recommended .   TEE - Normal LV size with EF 60-65%. Normal wall motion. Normal RV size and systolic function. Trivial TR, no TV vegetation. No PV vegetation. Trileaflet aortic valve with no stenosis or regurgitation, no vegetation. There was a mobile vegetation on the posterior leaflet of the mitral valve measuring  about 2 cm in greatest dimension. The posterior and anterior leaflets of the MV did not coapt properly and there was very eccentric, anteriorly-directed mitral regurgitation. I suspect severe MR. Jet was too eccentric for PISA calculations. There was systolic flattening but not flow reversal in the pulmonary vein doppler pattern. The left atrium was mildly dilated, no LA appendage thrombus. Normal right atrium. No evidence for PFO or ASD. Normal caliber aorta with mild plaque in the descending thoracic aorta.   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  Ct Head Wo Contrast   IMPRESSION: No significant change from the prior exam. No acute hemorrhage is noted.    PHYSICAL EXAM  Temp:  [98.1 F (36.7 C)-99.7 F (37.6 C)] 98.6 F (37 C) (03/16 1559) Pulse Rate:  [90-135] 93 (03/16 1659) Resp:  [16-20] 20 (03/16 1559) BP: (97-175)/(45-150) 133/60 mmHg (03/16 1659) SpO2:  [80 %-100 %] 98 % (03/16 1659)  General - morbid obesity, well developed, agitated and not cooperative on exam.  Ophthalmologic - Fundi not visualized due to noncooperation  Cardiovascular - Regular rate and rhythm.  Neuro - agitated and not cooperative. Sleepy and lethargic, following some simple commands, language output intact with moderate dysarthria, able to name and repeat. PERRL, right gaze preference, barely cross midline. Left neglect, not blinking to visual threat on the left, left facial droop, left hemiplegia, sensation test not cooperative. Coordination not cooperative.   NIH Stroke Scale  Level Of Consciousness 0=Alert; keenly responsive 1=Not alert, but arousable by minor stimulation 2=Not alert, requires repeated stimulation 3=Responds only with reflex movements 2  LOC Questions to Month and Age 32=Answers both questions correctly 1=Answers one question correctly 2=Answers neither question correctly 1  LOC Commands      -Open/Close eyes     -Open/close grip 0=Performs both tasks correctly 1=Performs one task correctly 2=Performs neighter task correctly 1  Best Gaze 0=Normal 1=Partial gaze palsy 2=Forced deviation, or total gaze paresis 1  Visual 0=No visual loss 1=Partial hemianopia 2=Complete hemianopia 3=Bilateral hemianopia (blind including cortical blindness) 1  Facial Palsy 0=Normal symmetrical movement 1=Minor paralysis (asymmetry) 2=Partial paralysis (lower face) 3=Complete paralysis (upper and lower face) 2   Motor  0=No drift, limb holds posture for full 10 seconds 1=Drift, limb holds posture, no drift to bed 2=Some antigravity effort, cannot maintain posture, drifts to bed 3=No effort against gravity, limb falls 4=No movement Right Arm 0     Leg 0    Left Arm 4     Leg 3  Limb Ataxia 0=Absent 1=Present in one limb 2=Present in two limbs 0  Sensory 0=Normal 1=Mild to moderate sensory loss 2=Severe to total sensory loss 1  Best Language 0=No aphasia, normal 1=Mild to moderate aphasia 2=Mute, global aphasia 3=Mute, global aphasia 0  Dysarthria 0=Normal 1=Mild to moderate 2=Severe, unintelligible or mute/anarthric 1  Extinction/Neglect 0=No abnormality 1=Extinction to bilateral simultaneous stimulation 2=Profound neglect 2  Total   19    ASSESSMENT/PLAN Margaret [name] is a 60 y.o. female with history of hypertension, hyperlipidemia, COPD, asthma, diabetes mellitus, migraines, panic attacks, and morbid obesity, presenting with mild headache, visual changes, low-grade fevers, and nausea. She did not receive IV t-PA due to late presentation and anticoagulation.  Acute neuro changes - concerning for right large cortical infarct  NIHSS = 19  CT head stat no bleeding  Concerning for right MCA occlusion  IR called to Dr. Estanislado Pandy  Cerebral angio pending  Small SAH within the left  parieto-occipital region, and 4 punctate infarcts at the watershed area including left MCA/PCA, bilateral MCA/ACA, likely due to endocarditis. No mycotic aneurysm found on CTA. CTV has ruled out CVT.   Resultant asymptomatic  MRI - left parieto-occipital small SAH, punctate infarcts at the watershed area (left MCA/PCA, b/l MCA/ACA)  MRA - negative  CTA head and neck - no cerebral venous thrombosis, AVM, or aneurysm.  2D Echo - EF 60-65% in 12/2015  LE venous doppler - negative for DVT  EEG - negative   TEE - MV endocarditis and MV regurgitation  LDL - 107  HgbA1c  8.4  Hypercoagulable work up positive lupus anticoagulant likely due to Xarelto use PTA. However, ANA and RF positive, need repeat as outpt  VTE prophylaxis - SCDs Diet Carb Modified Fluid consistency:: Thin; Room service appropriate?: Yes  Xarelto (rivaroxaban) daily prior to admission, now on No antithrombotic secondary to [facility]. Due to endocarditis, no antiplatelet or anticoagulation indicated at this time.  Patient counseled to be compliant with her antithrombotic medications  Ongoing aggressive stroke risk factor management  MV endocarditis with septic emboli - explains spleen infarct, SAH and embolic strokes  TEE showed MV endocarditis and MV regurgitation  CTA head and neck showed no mycotic aneurysm  Blood culture 2/2 strep vividan positive  ID on bard   Elevated WBC  On Rocephin  Repeat BCx pending  CTS consult pending to evaluate if surgical intervention needed.  Hypertension  Stable mildly low blood pressures  Avoid hypotension  Hyperlipidemia  Home meds:  Lovastatin 20 mg daily resumed in hospital  LDL 107, goal < 70  On pravastatin 20mg  now  Continue statin at discharge  Diabetes  HgbA1c 8.4, goal < 7.0  Uncontrolled  On lantus  SSI  Other Stroke Risk Factors  Advanced age  Cigarette smoker, quit smoking 3 years ago.  Morbid obesity, Body mass index is 45.5 kg/(m^2).   Hx stroke/TIA  Family hx stroke (father)  Migraines  PVD - aortic athrosclerosis  Other Active Problems  Leukocytosis  Elevated creatinine 1.10  Mild hyponatremia  Pulmonary HTN  Hilar lymphoadenopathy  Positive ANA and RF, need to repeat as outpt. Rheumatology referral if needed.   Hospital day # 5    Rosalin Hawking, MD PhD Stroke Neurology 02/06/2016 6:08 PM     To contact Stroke Continuity provider, please refer to [email or web address] After hours, contact General Neurology

## 2016-02-06 NOTE — Procedures (Signed)
S/P  Complete revascularization of occluded  RT ICA terminus and RT MCA prox with x2 passes with  Solitaire FR 77mm x 25mm retrieval device and x1 pass with solitaire FR 26mmx 30 mm retrieval device and 5.5 mg of superselective intraarterial integrelin achieving a TICI 3 revascularization

## 2016-02-06 NOTE — Anesthesia Postprocedure Evaluation (Signed)
Anesthesia Post Note  Patient: Margaret [name]  Procedure(s) Performed: Procedure(s) (LRB): RADIOLOGY WITH ANESTHESIA (N/A)  Patient location during evaluation: ICU Anesthesia Type: General Level of consciousness: patient remains intubated per anesthesia plan Pain management: pain level controlled Vital Signs Assessment: post-procedure vital signs reviewed and stable Respiratory status: patient remains intubated per anesthesia plan Cardiovascular status: stable Anesthetic complications: no    Last Vitals:  Filed Vitals:   02/06/16 1659 02/06/16 2200  BP: 133/60 141/111  Pulse: 93 90  Temp:  36.4 C  Resp:  17    Last Pain:  Filed Vitals:   02/06/16 2208  PainSc: 0-No pain                 EDWARDS,Reiko Vinje

## 2016-02-06 NOTE — Progress Notes (Signed)
OT Cancellation Note  Patient Details Name: Margaret [name] MRN: [id number] DOB: May 29, 1956   Cancelled Treatment:    Reason Eval/Treat Not Completed: Patient at procedure or test/ unavailable (at CT). Will follow up tomorrow.  [place], OTR/L  [phone] 02/06/2016 02/06/2016, 2:28 PM

## 2016-02-06 NOTE — Significant Event (Signed)
Patient fell earlier, I came in and personally evaluated the patient. She is awake, alert and oriented 4. Patient reported that she urinated while she was trying to get up, she slipped on the wet floor and fell on her bottom. She is also hypoxic, her chest x-ray showing CHF started on Lasix. I have asked for MRI, it was not done because patient couldn't stay still even after receiving 2 mg of Ativan. MRI canceled, CT scan was done which showed no changes since last study. After I evaluated her and the CT scan showed no changes rapid response call me that she had more neurological changes. Patient developed dense left-sided hemiparesis and altered mental status, slurred speech. Patient will be transferred to stepdown. I contacted neuro, I spoke with Dr. Erlinda Hong  they will check on her. I will transferred to stepdown, very low threshold to ICU.  Birdie Hopes Pager: [phone] 02/06/2016, 4:52 PM'

## 2016-02-06 NOTE — Consult Note (Signed)
CARDIOTHORACIC SURGERY BRIEF CONSULT NOTE   Patient seen and examined, TEE reviewed.  At present rapid response team at bedside because of acute ongoing neurologic event with new onset dense left sided hemiparesis and altered mental status.  Full consult note to follow.  Prognosis and plan will obviously be dramatically affected by the patient's neurologic condition.  Rexene Alberts, MD 02/06/2016 4:25 PM

## 2016-02-06 NOTE — Progress Notes (Signed)
Pt transferred to Neuro ICU. Report given to nurse on the unit.

## 2016-02-06 NOTE — H&P (Signed)
PULMONARY / CRITICAL CARE MEDICINE   Name: Margaret [name] MRN: [id number] DOB: Dec 26, 1955    ADMISSION DATE:  02/01/2016 CONSULTATION DATE:  3/16  REFERRING MD:  TRH  CHIEF COMPLAINT:  CVA s/p neuro IR  HISTORY OF PRESENT ILLNESS:  60 year old female with past medical history as below, which includes hypertension, hyperlipidemia, COPD, diabetes mellitus, and CHF. She recently admitted in February of this year for acute splenic infarction without clear etiology, however at that time she was also diagnosed with hepatic cirrhosis and he was initially thought this was the cause of the infarct as echocardiogram the time showed no cardiac source of emboli. She was started on anticoagulation and was slated to obtain outpatient TEE.  She returned to the hospital on 3/11 with fever, headache and a small subarachnoid bleed as well as several small "punctate infarcts" consistent with embolic events.  Blood cultures were positive for strep viridans at that point.  TEE showed a 2cm vegetation on the mitral valve with severe mitral regurgitation.  Cardiothoracic surgery was consulted on 3/16.    On 3/16 she had a mechanical fall and had a CT head that was read as normal around 1400.  Apparently she was given ativan.  After return from CT imaging she was noted to have left facial droop, slurred speech around 4PM.  Stroke was called, CT head repeated, then IR was consulted for possible cerebral angiogram.  She underwent an angiogram showing a R MCA embolism and occluded right internal carotid artery embolism.  These were retrieved and then she underwent selective intra-arterial integrilin infusion.    She returned from the procedure intubated and hypertensive  PAST MEDICAL HISTORY :  She  has a past medical history of Hypertension; Hyperlipidemia; COPD (chronic obstructive pulmonary disease) ([place]) (04/20/12); Asthma; Type II diabetes mellitus ([place]); Anemia; H/O hiatal hernia; Osteoarthritis; Pneumonia  (04/2012); Migraines; Panic attacks (04/20/12); Morbid obesity ([place]); Heart murmur; Bacterial endocarditis; Chronic diastolic CHF (congestive heart failure) ([place]) (04/27/2012); Pulmonary hypertension assoc with unclear multi-factorial mechanisms ([place]) (01/23/2016); Positive ANA (antinuclear antibody); Splenic infarction (01/14/2016); Stroke [facility]); and Subarachnoid bleed ([place]) (02/01/2016).  PAST SURGICAL HISTORY: She  has past surgical history that includes Laceration repair (1966); Dilation and curettage of uterus (1990's); finger reattachment (1973); and TEE without cardioversion (N/A, 02/05/2016).  Allergies  Allergen Reactions  . Codeine Hives, Itching and Other (See Comments)    "breathing problems"  . Latex Itching    No current facility-administered medications on file prior to encounter.   Current Outpatient Prescriptions on File Prior to Encounter  Medication Sig  . albuterol (PROVENTIL HFA;VENTOLIN HFA) 108 (90 BASE) MCG/ACT inhaler Inhale 2 puffs into the lungs every 4 (four) hours as needed. For shortness of breath.  Marland Kitchen albuterol (PROVENTIL) (2.5 MG/3ML) 0.083% nebulizer solution Take 2.5 mg by nebulization at bedtime.   . Alogliptin-Metformin HCl 12.03-999 MG TABS Take 1 tablet by mouth 2 (two) times daily.  Marland Kitchen aspirin 81 MG chewable tablet Chew 81 mg by mouth daily.  Marland Kitchen atenolol (TENORMIN) 50 MG tablet Take 12.5 mg by mouth 2 (two) times daily.   . cholecalciferol (VITAMIN D) 1000 units tablet Take 1,000 Units by mouth 2 (two) times daily.  . furosemide (LASIX) 40 MG tablet Take 40 mg by mouth daily.   Marland Kitchen ibuprofen (ADVIL,MOTRIN) 200 MG tablet Take 600 mg by mouth every 6 (six) hours as needed for headache or mild pain.  Marland Kitchen insulin NPH-regular (NOVOLIN 70/30) (70-30) 100 UNIT/ML injection Inject 6-16 Units into the skin 2 (  two) times daily with a meal.  . loperamide (IMODIUM) 2 MG capsule Take 2 mg by mouth daily as needed for diarrhea or loose stools.  . lovastatin (MEVACOR) 20 MG tablet  Take 10 mg by mouth at bedtime.  . naproxen sodium (ANAPROX) 220 MG tablet Take 220 mg by mouth 2 (two) times daily as needed (pain).  . nitroGLYCERIN (NITROSTAT) 0.4 MG SL tablet Place 1 tablet (0.4 mg total) under the tongue every 5 (five) minutes x 3 doses as needed for chest pain.  Marland Kitchen Potassium 99 MG TABS Take 99 mg by mouth every morning.  . Rivaroxaban (XARELTO STARTER PACK) 15 & 20 MG TBPK Take as directed on package: Start with one 15mg  tablet by mouth twice a day with food. On Day 22, switch to one 20mg  tablet once a day with food.  Marland Kitchen spironolactone (ALDACTONE) 25 MG tablet Take 50 mg by mouth daily.   . ciprofloxacin (CIPRO) 500 MG tablet Take 1 tablet (500 mg total) by mouth 2 (two) times daily. (Patient not taking: Reported on 02/01/2016)  . metroNIDAZOLE (FLAGYL) 500 MG tablet Take 1 tablet (500 mg total) by mouth every 8 (eight) hours. (Patient not taking: Reported on 02/01/2016)    FAMILY HISTORY/SOCIAL HISTORY/REVIEW OF SYSTEMS:   Cannot obtain due to intubation  SUBJECTIVE:  As above  VITAL SIGNS: BP 133/60 mmHg  Pulse 93  Temp(Src) 98.6 F (37 C) (Oral)  Resp 20  Ht 5\' 5"  (1.651 m)  Wt 124.013 kg (273 lb 6.4 oz)  BMI 45.50 kg/m2  SpO2 98%  HEMODYNAMICS:    VENTILATOR SETTINGS:    INTAKE / OUTPUT: I/O last 3 completed shifts: In: 63 [P.O.:120; I.V.:1000; IV Piggyback:150] Out: 1000 [Urine:1000]  PHYSICAL EXAMINATION: Gen. Obese, intubated, in no distress, ENT -  no post nasal drip, Oral ET tube Neck: No JVD, no thyromegaly, no carotid bruits Lungs: no use of accessory muscles, no dullness to percussion, decreased without rales or rhonchi  Cardiovascular: Rhythm regular, heart sounds  normal, no murmurs, 1+ peripheral edema Abdomen: soft and non-tender, no hepatosplenomegaly, BS normal. Musculoskeletal: No deformities, no cyanosis or clubbing Neuro:  Sedated, unable to examine for focality Skin:  Warm, no lesions/ rash   LABS:  BMET  Recent  Labs Lab 01/31/16 2232 01/31/16 2233 02/03/16 0032 02/05/16 0322  NA 131* 132* 134* 134*  K 4.9 4.6 4.8 4.3  CL 94* 94* 98* 99*  CO2 26  --  26 25  BUN 13 15 20 10   CREATININE 1.13* 1.10* 0.92 0.91  GLUCOSE 215* 211* 198* 204*    Electrolytes  Recent Labs Lab 01/31/16 2232 02/03/16 0032 02/05/16 0322  CALCIUM 10.2 11.0* 10.2    CBC  Recent Labs Lab 01/31/16 2232 01/31/16 2233 02/03/16 1110 02/05/16 0322  WBC 17.9*  --  15.5* 14.2*  HGB 13.0 14.6 12.3 12.9  HCT 40.9 43.0 41.1 41.5  PLT 478*  --  474* 398    Coag's  Recent Labs Lab 01/31/16 2232  APTT 36  INR 2.16*    Sepsis Markers No results for input(s): LATICACIDVEN, PROCALCITON, O2SATVEN in the last 168 hours.  ABG  Recent Labs Lab 02/06/16 1700  PHART 7.430  PCO2ART 43.6  PO2ART 84.5    Liver Enzymes  Recent Labs Lab 01/31/16 2232 02/05/16 0322  AST 18 15  ALT 12* 14  ALKPHOS 75 76  BILITOT 0.5 0.7  ALBUMIN 3.1* 2.8*    Cardiac Enzymes  Recent Labs Lab 02/05/16 0322 02/05/16 0802  02/05/16 1448  TROPONINI 0.04* 0.04* 0.05*    Glucose  Recent Labs Lab 02/05/16 1450 02/05/16 1658 02/05/16 2138 02/06/16 0753 02/06/16 1110 02/06/16 1600  GLUCAP 159* 145* 147* 177* 216* 183*    Imaging Ct Head Wo Contrast  02/06/2016  CLINICAL DATA:  Stroke.  Left-sided weakness.  Endocarditis. EXAM: CT HEAD WITHOUT CONTRAST TECHNIQUE: Contiguous axial images were obtained from the base of the skull through the vertex without intravenous contrast. COMPARISON:  Head CT 02/06/2016 at 1400 hours. Head MRI and CT 02/01/2016. FINDINGS: There is a subcentimeter focus of hyperattenuation associated with a right parietal sulcus (series 2, image 27) which may reflect trace subarachnoid hemorrhage/ proteinaceous debris. This is unchanged from today's earlier CT but is new from 02/01/2016. Left parietal subarachnoid hemorrhage/debris on the 02/01/2016 CT is much less conspicuous now. There is no  evidence of acute large territory infarct, mass, midline shift, or extra-axial fluid collection. The punctate acute infarcts on the recent prior MRI are not well seen by CT. There is mild generalized cerebral atrophy. Orbits are unremarkable. Minimal paranasal sinus mucosal thickening is noted. The mastoid air cells are clear. No acute osseous abnormality is identified. Calcified atherosclerosis is noted at the skull base. IMPRESSION: 1. Decreased conspicuity of left parieto-occipital subarachnoid hemorrhage/debris compared to the studies from 02/01/2016. 2. Small focus of subarachnoid hemorrhage/debris in a right parietal sulcus, unchanged from today's earlier study but new from 02/01/2016. This may reflect a new focus of hemorrhage or redistribution of the pre-existing blood products. Electronically Signed   By: Logan Bores M.D.   On: 02/06/2016 18:26   Ct Head Wo Contrast  02/06/2016  CLINICAL DATA:  Slip and fall today with slurred speech and hypoxia EXAM: CT HEAD WITHOUT CONTRAST TECHNIQUE: Contiguous axial images were obtained from the base of the skull through the vertex without intravenous contrast. COMPARISON:  02/01/2016 FINDINGS: Bony calvarium is intact. No findings to suggest acute hemorrhage, acute infarction or space-occupying mass lesion is seen. Some gyral swelling is noted similar to that seen on the prior exam in the left posterior parietal region. This is stable from the prior exam. No acute abnormality is noted. IMPRESSION: No significant change from the prior exam. No acute hemorrhage is noted. Electronically Signed   By: Inez Catalina M.D.   On: 02/06/2016 14:09   Dg Chest Port 1 View  02/06/2016  CLINICAL DATA:  Shortness of breath today. EXAM: PORTABLE CHEST 1 VIEW COMPARISON:  02/01/2016 FINDINGS: The heart is enlarged but stable. There is tortuosity, ectasia and calcification of the thoracic aorta. There is vascular congestion and interstitial pulmonary edema. No definite pleural  effusions. The bony thorax is intact. IMPRESSION: Mild CHF. Electronically Signed   By: Marijo Sanes M.D.   On: 02/06/2016 15:19     STUDIES:  3/11 CT head > localized subarachnoid hemorrhage/debris in left parietal occipital region 3/11 CT angiogram> mild intracranial and extracranial atherosclerosis without stenosis, patent dural venous sinuses, no vascular malformation 3/11 MRI/MRA brain > small volume subarachnoid hemorrhage/debri left parieto-occipital region, few sub cm cortical subcortical ischemic infarcts in bilateral parietal lobes, negative MRA 3/14 EEG > no evidence of epileptiform discharges 3/15 TEE> 2 cm mitral valve vegetation with regurgitation 3/15 CT ab/pelvis> new splenic infarct, larger than before, enlarged spleen, diffuse hepatic steatosis, mosaic attenuation of lung bases, consider pulmonary arterial hypertension 3/16 CT head > No significant change from prior 3/16 CT head> decreased conspicuity of left parieto-occipital subarachnoid hemorrhage; ? 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  Also found to have hepatic steatosis.  On 3/16 she underwent emergent cerebral angiogram and clot retrieval from the R ICA artery and R MCA artery, returned from IR on the vent  ASSESSMENT / PLAN:  NEUROLOGIC A:   Acute ischemic stroke : R MCA and R ICA occlusion from emboli Subarachnoid hemorrhage on admission P:    RASS goal: -1 Propofol gtt Prn fentanyl Post op stroke orderset per stroke team  PULMONARY A: Acute respiratory failure on vent P:   Full vent support VAP bundle ABG now CXR now SBT/WUA daily  CARDIOVASCULAR A:  Acute ischemic stroke Mildly elevated troponin in setting of stroke Mitral valve regurgitation and endocarditis P:  BP goal per neurology SBP 120-140 Prn labetalol for hypertension 12 lead Valve repair per TCTS  RENAL A: Mild hyponatremia P:   Monitor sodium Monitor BMET and UOP Replace electrolytes as needed   GASTROINTESTINAL A:   No acute issues P:   Pepcid for stress ulcer prophylaxis NPO  HEMATOLOGIC A:   No acute issues P:  Monitor for bleeding SCD given subarachnoid hemorrhage and new ischemic stroke  INFECTIOUS A:   Spontaneous bacterial endocarditis with strep viridans P:   Continue ampicillin F/u ID recs  ENDOCRINE A:   Hyperglycemia P:   Target glucose 140-180 with acute ischemic stroke   FAMILY  - Updates: Husband at bedside  - Inter-disciplinary family meet or Palliative Care meeting due by:  day 7   Summary - appears to be 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 We will assess neurological deficits in a.m. and I will decide whether she is a candidate for eventual mitral valve replacement.  The patient is critically ill with multiple organ systems failure and requires high complexity decision making for assessment and support, frequent evaluation and titration of therapies, application of advanced monitoring technologies and extensive interpretation of multiple databases. Critical Care Time devoted to patient care services described in this note independent of APP time is 50 minutes.   Kara Mead MD. Shade Flood. [facility] Pager [phone] If no response call [phone]    02/06/2016, 9:19 PM

## 2016-02-06 NOTE — Progress Notes (Signed)
Pharmacy Antibiotic Note  Margaret [name] is a 60 y.o. female admitted on 02/01/2016 with bacteremia, now found to have endocarditis.  Pharmacy has been consulted for ampicillin dosing.  Plan: BCID results were discussed with Dr. Linus Salmons and the patient's antibiotics will be changed to ampicillin 2g IV q4h.   Height: 5\' 5"  (165.1 cm) Weight: 273 lb 6.4 oz (124.013 kg) IBW/kg (Calculated) : 57  Temp (24hrs), Avg:98.5 F (36.9 C), Min:98 F (36.7 C), Max:99.7 F (37.6 C)   Recent Labs Lab 01/31/16 2232 01/31/16 2233 02/03/16 0032 02/03/16 1110 02/05/16 0322  WBC 17.9*  --   --  15.5* 14.2*  CREATININE 1.13* 1.10* 0.92  --  0.91    Estimated Creatinine Clearance: 88.1 mL/min (by C-G formula based on Cr of 0.91).    Allergies  Allergen Reactions  . Codeine Hives, Itching and Other (See Comments)    "breathing problems"  . Latex Itching    Antimicrobials this admission: ceftriaxone 3/12 >> 3/16 ampicillin 3/16 >>   Dose adjustments this admission: n/a  Microbiology results: 3/11 BCx: 2/2 viridans strep, pan sens 3/11 flu panel: neg 3/11 MRSA PCR: neg 3/14 BCx: ngtd  Thank you for allowing pharmacy to be a part of this patient's care.  Krissa Utke D. Daran Favaro, PharmD, BCPS Clinical Pharmacist Pager: [phone] 02/06/2016 11:48 AM

## 2016-02-06 NOTE — Care Management Note (Signed)
Case Management Note  Patient Details  Name: JOLEENE PESOLA MRN: [id number] Date of Birth: [birth year]  Subjective/Objective:                 Admitted with splenic infarct/ bacteremia. From home with husband.Indendent with ADL's  PTA. Pt with no PCP or insurance.    Action/Plan: Plan to d/c home with home health services when medically stable.CM to f/u with disposition needs. CM scheduled post hospital follow up appointment with [facility] on 02/11/2016 @ 3pm to help establish PCP and medication assistance @ d/c.  Expected Discharge Date:                  Expected Discharge Plan:  [place]  In-House Referral:     Discharge planning Services  CM Consult  Post Acute Care Choice:    Choice offered to:  Patient  DME Arranged:  IV pump/equipment DME Agency:  [place]:  RN [facility] Agency:  [place]  Status of Service:  In process, will continue to follow  Medicare Important Message Given:    Date Medicare IM Given:    Medicare IM give by:    Date Additional Medicare IM Given:    Additional Medicare Important Message give by:     If discussed at [place] of Stay Meetings, dates discussed:    Additional Comments:  Sharin Mons, [place] [phone] 02/06/2016, 2:56 PM

## 2016-02-06 NOTE — Progress Notes (Signed)
Called by Dr. Hartford Poli that pt had fall earlier today at 11am. No focal neuro deficit at that time. Attempted for MRI not able but received ativan 2mg . Had plain CT showed no acute abnormality. Dr. Hartford Poli examined her when she came back from CT at 2:10pm and she was neuro intact. Since then, she was drowsy due to ativan. However, husband found her to have left facial droop, slurry speech, left hemiplegia, right gaze and left neglect at about 4pm. Rapid response called, NIHSS = 19. Not able to established IV assess due to agitation and noncooperation. Had again CT stat head showed no ICH or SAH. Emergent IR call to Dr. Estanislado Pandy. Pt went for cerebral angio diagnostic.   Rosalin Hawking, MD PhD Stroke Neurology 02/06/2016 6:07 PM

## 2016-02-06 NOTE — Progress Notes (Addendum)
Was the fall witnessed: No   Patient condition before and after the fall: Alert and oriented x 4 sitting up on side of bed eating breakfast   Patient's reaction to the fall: She kept saying it was her fault and that she should not have reached across the table because it rolled and caused her to loose grip and slip out of chair  Name of the doctor that was notified including date and time: Dr. Hartford Poli  Any interventions and vital signs: Yes assessment done pt stated she did not hit her head and that she just slipped down into the floor on her bottom. Neuro checks done and pt was able to grip and answer questions. She was able to tell me how many fingers I had up and was able to move arms and legs, wiggle toes. Pupils responsive.  Vital signs done according to fall protocol.

## 2016-02-06 NOTE — Progress Notes (Addendum)
[facility] for Infectious Disease   Reason for visit: Follow up on Strep viridans endocarditis  Interval History: no fever, no new complaints.  TEE with vegetation.  Repeat blood cultures negative to date.    Physical Exam: Constitutional:  Filed Vitals:   02/06/16 1036 02/06/16 1039  BP: 175/150 109/45  Pulse: 111 111  Temp: 98.1 F (36.7 C) 98.3 F (36.8 C)  Resp: 20 20   patient appears in NAD, sleeping Respiratory: Normal respiratory effort; CTA B Cardiovascular: RRR  Review of Systems: Constitutional: negative for fevers and chills Gastrointestinal: negative for diarrhea  Lab Results  Component Value Date   WBC 14.2* 02/05/2016   HGB 12.9 02/05/2016   HCT 41.5 02/05/2016   MCV 88.3 02/05/2016   PLT 398 02/05/2016    Lab Results  Component Value Date   CREATININE 0.91 02/05/2016   BUN 10 02/05/2016   NA 134* 02/05/2016   K 4.3 02/05/2016   CL 99* 02/05/2016   CO2 25 02/05/2016    Lab Results  Component Value Date   ALT 14 02/05/2016   AST 15 02/05/2016   ALKPHOS 76 02/05/2016     Microbiology: Recent Results (from the past 240 hour(s))  Culture, blood (Routine X 2) w Reflex to ID Panel     Status: None   Collection Time: 02/01/16  7:45 AM  Result Value Ref Range Status   Specimen Description BLOOD RIGHT ARM  Final   Special Requests BOTTLES DRAWN AEROBIC AND ANAEROBIC 10CC  Final   Culture  Setup Time   Final    GRAM POSITIVE COCCOBACILLUS IN BOTH AEROBIC AND ANAEROBIC BOTTLES CRITICAL RESULT CALLED TO, READ BACK BY AND VERIFIED WITH: C SOSA 02/02/16 @ 1014 M VESTAL    Culture   Final    VIRIDANS STREPTOCOCCUS SUSCEPTIBILITIES PERFORMED ON PREVIOUS CULTURE WITHIN THE LAST 5 DAYS.    Report Status 02/04/2016 FINAL  Final  Culture, blood (Routine X 2) w Reflex to ID Panel     Status: None   Collection Time: 02/01/16  8:00 AM  Result Value Ref Range Status   Specimen Description BLOOD LEFT HAND  Final   Special Requests BOTTLES DRAWN  AEROBIC AND ANAEROBIC 10CC  Final   Culture  Setup Time   Final    GRAM POSITIVE COCCOBACILLUS IN BOTH AEROBIC AND ANAEROBIC BOTTLES CRITICAL RESULT CALLED TO, READ BACK BY AND VERIFIED WITH: C SOSA 02/02/16 @ 1014 M VESTAL    Culture VIRIDANS STREPTOCOCCUS  Final   Report Status 02/04/2016 FINAL  Final   Organism ID, Bacteria VIRIDANS STREPTOCOCCUS  Final      Susceptibility   Viridans streptococcus - MIC*    PENICILLIN <=0.06 SENSITIVE Sensitive     CEFTRIAXONE <=0.12 SENSITIVE Sensitive     ERYTHROMYCIN <=0.12 SENSITIVE Sensitive     LEVOFLOXACIN 0.5 SENSITIVE Sensitive     VANCOMYCIN 0.5 SENSITIVE Sensitive     * VIRIDANS STREPTOCOCCUS  MRSA PCR Screening     Status: None   Collection Time: 02/01/16 12:54 PM  Result Value Ref Range Status   MRSA by PCR NEGATIVE NEGATIVE Final    Comment:        The GeneXpert MRSA Assay (FDA approved for NASAL specimens only), is one component of a comprehensive MRSA colonization surveillance program. It is not intended to diagnose MRSA infection nor to guide or monitor treatment for MRSA infections.   Culture, blood (routine x 2)     Status: None (Preliminary result)  Collection Time: 02/04/16 12:36 PM  Result Value Ref Range Status   Specimen Description BLOOD  Final   Special Requests IN PEDIATRIC BOTTLE 3CC  Final   Culture NO GROWTH 2 DAYS  Final   Report Status PENDING  Incomplete  Culture, blood (routine x 2)     Status: None (Preliminary result)   Collection Time: 02/04/16 12:48 PM  Result Value Ref Range Status   Specimen Description BLOOD  Final   Special Requests IN PEDIATRIC BOTTLE 3CC  Final   Culture NO GROWTH 2 DAYS  Final   Report Status PENDING  Incomplete    Impression/Plan:  1. Strep viridans native mitral valve endocarditis - 2 cm, + mitral regurgitation.  Will need to be seen by cardiac surgery. Antibiotics narrowed to ampicillin 2. Splenic infarct - most consistent with emboli from Strep.  Also cerebral  infarcts, likely also Strep.

## 2016-02-06 NOTE — Clinical Documentation Improvement (Addendum)
Hospitalist, Neurology, Infectious Disease and/or other providers  (Query response must be documented in the current medical record, not on the CDI BPA form. Thank you.)  "Bacteremia" is documented in the current medical record as of 02/02/16.   Please document if a condition below provides greater specificity regarding the patient's Bacteremia:  - Sepsis, present or evolving at the time of admission  - Other condition  - Unable to clinically determine  Clinical Information/Indicators: WBC on admission - 17.9 Temp in ED 100.6 Blood Cxs results x 2  GRAM POSITIVE COCCOBACILLUS  \.in30\IN BOTH AEROBIC AND ANAEROBIC BOTTLES  \.in30\CRITICAL RESULT CALLED TO, READ BACK BY AND   \.in30\VERIFIED WITH: C SOSA 02/02/16 @ [place]   Please exercise your independent, professional judgment when responding. A specific answer is not anticipated or expected.   Thank You, Erling Conte  RN BSN CCDS [phone] Health Information Management [place]

## 2016-02-06 NOTE — Progress Notes (Signed)
PT Cancellation Note  Patient Details Name: Margaret [name] MRN: [id number] DOB: [birth year]   Cancelled Treatment:    Reason Eval/Treat Not Completed: Fatigue/lethargy limiting ability to participate; patient asleep following sedation to undergo MRI.  Spouse reports fell this morning and too fatigued for PT this PM.  Will attempt to see pt tomorrow.   Reginia Naas 02/06/2016, 2:45 PM Magda Kiel, [place] 02/06/2016

## 2016-02-07 ENCOUNTER — Inpatient Hospital Stay (HOSPITAL_COMMUNITY): Payer: Medicaid Other

## 2016-02-07 ENCOUNTER — Encounter (HOSPITAL_COMMUNITY): Payer: Self-pay | Admitting: Interventional Radiology

## 2016-02-07 DIAGNOSIS — I6521 Occlusion and stenosis of right carotid artery: Secondary | ICD-10-CM

## 2016-02-07 DIAGNOSIS — I609 Nontraumatic subarachnoid hemorrhage, unspecified: Secondary | ICD-10-CM

## 2016-02-07 DIAGNOSIS — I6601 Occlusion and stenosis of right middle cerebral artery: Secondary | ICD-10-CM

## 2016-02-07 DIAGNOSIS — Z9911 Dependence on respirator [ventilator] status: Secondary | ICD-10-CM

## 2016-02-07 LAB — CBC
HEMATOCRIT: 34.9 % — AB (ref 36.0–46.0)
Hemoglobin: 10.4 g/dL — ABNORMAL LOW (ref 12.0–15.0)
MCH: 26.6 pg (ref 26.0–34.0)
MCHC: 29.8 g/dL — AB (ref 30.0–36.0)
MCV: 89.3 fL (ref 78.0–100.0)
PLATELETS: 437 10*3/uL — AB (ref 150–400)
RBC: 3.91 MIL/uL (ref 3.87–5.11)
RDW: 15.5 % (ref 11.5–15.5)
WBC: 24.5 10*3/uL — ABNORMAL HIGH (ref 4.0–10.5)

## 2016-02-07 LAB — GLUCOSE, CAPILLARY
GLUCOSE-CAPILLARY: 150 mg/dL — AB (ref 65–99)
GLUCOSE-CAPILLARY: 169 mg/dL — AB (ref 65–99)
Glucose-Capillary: 131 mg/dL — ABNORMAL HIGH (ref 65–99)
Glucose-Capillary: 139 mg/dL — ABNORMAL HIGH (ref 65–99)

## 2016-02-07 LAB — POCT I-STAT 3, ART BLOOD GAS (G3+)
Acid-Base Excess: 2 mmol/L (ref 0.0–2.0)
BICARBONATE: 30 meq/L — AB (ref 20.0–24.0)
O2 Saturation: 100 %
PO2 ART: 379 mmHg — AB (ref 80.0–100.0)
TCO2: 32 mmol/L (ref 0–100)
pCO2 arterial: 59.3 mmHg (ref 35.0–45.0)
pH, Arterial: 7.31 — ABNORMAL LOW (ref 7.350–7.450)

## 2016-02-07 LAB — COMPREHENSIVE METABOLIC PANEL
ALBUMIN: 2.3 g/dL — AB (ref 3.5–5.0)
ALT: 10 U/L — ABNORMAL LOW (ref 14–54)
ANION GAP: 8 (ref 5–15)
AST: 11 U/L — ABNORMAL LOW (ref 15–41)
Alkaline Phosphatase: 67 U/L (ref 38–126)
BILIRUBIN TOTAL: 0.2 mg/dL — AB (ref 0.3–1.2)
BUN: 6 mg/dL (ref 6–20)
CHLORIDE: 101 mmol/L (ref 101–111)
CO2: 27 mmol/L (ref 22–32)
Calcium: 10 mg/dL (ref 8.9–10.3)
Creatinine, Ser: 0.84 mg/dL (ref 0.44–1.00)
GFR calc Af Amer: >60 mL/min (ref >60)
GFR calc non Af Amer: >60 mL/min (ref >60)
GLUCOSE: 180 mg/dL — AB (ref 65–99)
Potassium: 4.3 mmol/L (ref 3.5–5.1)
SODIUM: 136 mmol/L (ref 135–145)
TOTAL PROTEIN: 5.9 g/dL — AB (ref 6.5–8.1)

## 2016-02-07 LAB — PREALBUMIN: PREALBUMIN: 7.4 mg/dL — AB (ref 18–38)

## 2016-02-07 LAB — PROTIME-INR
INR: 1.32 (ref 0.00–1.49)
Prothrombin Time: 16.5 seconds — ABNORMAL HIGH (ref 11.6–15.2)

## 2016-02-07 LAB — APTT: APTT: 27 s (ref 24–37)

## 2016-02-07 MED ORDER — CHLORHEXIDINE GLUCONATE 0.12% ORAL RINSE (MEDLINE KIT)
15.0000 mL | Freq: Two times a day (BID) | OROMUCOSAL | Status: DC
  Administered 2016-02-07 – 2016-02-08 (×3): 15 mL via OROMUCOSAL

## 2016-02-07 MED ORDER — ACETAMINOPHEN 650 MG RE SUPP: 650.0000 mg | Freq: Four times a day (QID) | RECTAL | Status: DC | PRN

## 2016-02-07 MED ORDER — NICARDIPINE HCL IN NACL 20-0.86 MG/200ML-% IV SOLN: 5.0000 mg/h | INTRAVENOUS | Status: DC

## 2016-02-07 MED ORDER — SODIUM CHLORIDE 0.9 % IV SOLN
INTRAVENOUS | Status: DC
  Administered 2016-02-07 – 2016-02-09 (×3): via INTRAVENOUS

## 2016-02-07 MED ORDER — CHLORHEXIDINE GLUCONATE 0.12 % MT SOLN
OROMUCOSAL | Status: AC
  Filled 2016-02-07: qty 15

## 2016-02-07 MED ORDER — ACETAMINOPHEN 500 MG PO TABS
1000.0000 mg | ORAL_TABLET | Freq: Four times a day (QID) | ORAL | Status: DC | PRN
  Administered 2016-02-09 (×2): 1000 mg via ORAL
  Filled 2016-02-07 (×2): qty 2

## 2016-02-07 MED ORDER — ANTISEPTIC ORAL RINSE SOLUTION (CORINZ)
7.0000 mL | OROMUCOSAL | Status: DC
  Administered 2016-02-07 – 2016-02-08 (×12): 7 mL via OROMUCOSAL

## 2016-02-07 MED ORDER — ONDANSETRON HCL 4 MG/2ML IJ SOLN: 4.0000 mg | Freq: Four times a day (QID) | INTRAMUSCULAR | Status: DC | PRN

## 2016-02-07 NOTE — Progress Notes (Signed)
Referring Physician(s): Stroke  Supervising Physician: Dr Luanne Bras  Chief Complaint:   CEREBRAL ANGIOGRAM [id number] (Custom)]      Expand All Collapse All   S/P Complete revascularization of occluded RT ICA terminus and RT MCA prox with x2 passes with Solitaire FR 51mm x 4mm retrieval device and x1 pass with solitaire FR 13mmx 30 mm retrieval device and 5.5 mg of superselective intraarterial integrelin achieving a TICI 3 revascularization      Subjective:  On vent Moving Rt side to command No other response  Allergies: Codeine and Latex  Medications: Prior to Admission medications   Medication Sig Start Date End Date Taking? Authorizing Provider  albuterol (PROVENTIL HFA;VENTOLIN HFA) 108 (90 BASE) MCG/ACT inhaler Inhale 2 puffs into the lungs every 4 (four) hours as needed. For shortness of breath. 04/30/12  Yes Shanker Kristeen Mans, MD  albuterol (PROVENTIL) (2.5 MG/3ML) 0.083% nebulizer solution Take 2.5 mg by nebulization at bedtime.    Yes Historical Provider, MD  Alogliptin-Metformin HCl 12.03-999 MG TABS Take 1 tablet by mouth 2 (two) times daily.   Yes Historical Provider, MD  aspirin 81 MG chewable tablet Chew 81 mg by mouth daily.   Yes Historical Provider, MD  atenolol (TENORMIN) 50 MG tablet Take 12.5 mg by mouth 2 (two) times daily.    Yes Historical Provider, MD  cholecalciferol (VITAMIN D) 1000 units tablet Take 1,000 Units by mouth 2 (two) times daily.   Yes Historical Provider, MD  furosemide (LASIX) 40 MG tablet Take 40 mg by mouth daily.    Yes Historical Provider, MD  ibuprofen (ADVIL,MOTRIN) 200 MG tablet Take 600 mg by mouth every 6 (six) hours as needed for headache or mild pain.   Yes Historical Provider, MD  insulin NPH-regular (NOVOLIN 70/30) (70-30) 100 UNIT/ML injection Inject 6-16 Units into the skin 2 (two) times daily with a meal.   Yes Historical Provider, MD  loperamide (IMODIUM) 2 MG capsule Take 2 mg by mouth daily as needed for  diarrhea or loose stools.   Yes Historical Provider, MD  lovastatin (MEVACOR) 20 MG tablet Take 10 mg by mouth at bedtime.   Yes Historical Provider, MD  naproxen sodium (ANAPROX) 220 MG tablet Take 220 mg by mouth 2 (two) times daily as needed (pain).   Yes Historical Provider, MD  nitroGLYCERIN (NITROSTAT) 0.4 MG SL tablet Place 1 tablet (0.4 mg total) under the tongue every 5 (five) minutes x 3 doses as needed for chest pain. 06/20/12 02/01/16 Yes Jettie Booze, MD  oxyCODONE-acetaminophen (PERCOCET) 10-325 MG tablet Take 1 tablet by mouth 2 (two) times daily as needed for pain.   Yes Historical Provider, MD  Potassium 99 MG TABS Take 99 mg by mouth every morning.   Yes Historical Provider, MD  Rivaroxaban (XARELTO STARTER PACK) 15 & 20 MG TBPK Take as directed on package: Start with one 15mg  tablet by mouth twice a day with food. On Day 22, switch to one 20mg  tablet once a day with food. 01/16/16  Yes Maryann Mikhail, DO  spironolactone (ALDACTONE) 25 MG tablet Take 50 mg by mouth daily.    Yes Historical Provider, MD  ciprofloxacin (CIPRO) 500 MG tablet Take 1 tablet (500 mg total) by mouth 2 (two) times daily. Patient not taking: Reported on 02/01/2016 01/16/16   Velta Addison Mikhail, DO  metroNIDAZOLE (FLAGYL) 500 MG tablet Take 1 tablet (500 mg total) by mouth every 8 (eight) hours. Patient not taking: Reported on 02/01/2016 01/16/16   [facility]  Mikhail, DO     Vital Signs: BP 120/61 mmHg  Pulse 82  Temp(Src) 98.6 F (37 C) (Oral)  Resp 21  Ht 5\' 5"  (1.651 m)  Wt 271 lb 9.7 oz (123.2 kg)  BMI 45.20 kg/m2  SpO2 97%  Physical Exam  Neurological:  Does follow commands to move Rt hand/ and Rt leg/foot No movement on Left  Cannot follow command to open eyes  On vent  Skin: Skin is warm and dry.  B groin sheaths intact No bleeding No hematoma Clean and dry  B feet 1+ pulses    Nursing note and vitals reviewed.   Imaging: Ct Head Wo Contrast  02/07/2016  CLINICAL DATA:   Follow-up exam for acute stroke, status post catheter directed revascularization. EXAM: CT HEAD WITHOUT CONTRAST TECHNIQUE: Contiguous axial images were obtained from the base of the skull through the vertex without intravenous contrast. COMPARISON:  Prior CT from 02/06/2016 as well as multiple previous studies. FINDINGS: Previously identified small volume acute subarachnoid hemorrhage within the right parieto-occipital region is slightly less conspicuous on today's study. Additionally, known subarachnoid hemorrhage/ debris within the left occipital region not well seen. No new intracranial hemorrhage. No definite evolving large vessel territory infarct identified. No mass lesion or midline shift. No mass effect. No hydrocephalus. No extra-axial fluid collection. Scalp soft tissues within normal limits. No acute abnormality about the orbits. Scattered fluid levels with opacity within the sphenoid ethmoidal sinuses. Patient appears to be intubated. No mastoid effusion. Calvarium intact. IMPRESSION: 1. Interval decrease in conspicuity of small volume right parietal and left parieto-occipital hemorrhage. No new intracranial hemorrhage identified. 2. No definite acute or evolving infarct identified by CT. Electronically Signed   By: Jeannine Boga M.D.   On: 02/07/2016 03:20   Ct Head Wo Contrast  02/06/2016  CLINICAL DATA:  She stroke. Left facial droop, left hemiplegia, slurred speech, left neglect, and right gaze. Status post cerebral angiography and revascularization of occluded right ICA terminus and right MCA. EXAM: CT HEAD WITHOUT CONTRAST TECHNIQUE: Contiguous axial images were obtained from the base of the skull through the vertex without intravenous contrast. COMPARISON:  Head CT 1743 hours today FINDINGS: Small volume right parietal subarachnoid hemorrhage is unchanged, as is small volume residual left parieto-occipital subarachnoid hemorrhage. No definite edema suggestive of acute infarct is  identified. No mass, midline shift, or extra-axial fluid collection is seen. Orbits are unremarkable. An endotracheal tube is partially visualized, with secretions noted in the posterior nasal cavity and pharynx. Mild mucosal thickening/small volume secretions are also noted in the right sphenoid sinus and right frontal sinus. Mastoid air cells are clear. IMPRESSION: 1. Unchanged small volume right parietal and left parieto-occipital subarachnoid hemorrhage. 2. No acute infarct apparent by CT. Electronically Signed   By: Logan Bores M.D.   On: 02/06/2016 21:59   Ct Head Wo Contrast  02/06/2016  CLINICAL DATA:  Stroke.  Left-sided weakness.  Endocarditis. EXAM: CT HEAD WITHOUT CONTRAST TECHNIQUE: Contiguous axial images were obtained from the base of the skull through the vertex without intravenous contrast. COMPARISON:  Head CT 02/06/2016 at 1400 hours. Head MRI and CT 02/01/2016. FINDINGS: There is a subcentimeter focus of hyperattenuation associated with a right parietal sulcus (series 2, image 27) which may reflect trace subarachnoid hemorrhage/ proteinaceous debris. This is unchanged from today's earlier CT but is new from 02/01/2016. Left parietal subarachnoid hemorrhage/debris on the 02/01/2016 CT is much less conspicuous now. There is no evidence of acute large territory infarct, mass, midline  shift, or extra-axial fluid collection. The punctate acute infarcts on the recent prior MRI are not well seen by CT. There is mild generalized cerebral atrophy. Orbits are unremarkable. Minimal paranasal sinus mucosal thickening is noted. The mastoid air cells are clear. No acute osseous abnormality is identified. Calcified atherosclerosis is noted at the skull base. IMPRESSION: 1. Decreased conspicuity of left parieto-occipital subarachnoid hemorrhage/debris compared to the studies from 02/01/2016. 2. Small focus of subarachnoid hemorrhage/debris in a right parietal sulcus, unchanged from today's earlier study but  new from 02/01/2016. This may reflect a new focus of hemorrhage or redistribution of the pre-existing blood products. Electronically Signed   By: Logan Bores M.D.   On: 02/06/2016 18:26   Ct Head Wo Contrast  02/06/2016  CLINICAL DATA:  Slip and fall today with slurred speech and hypoxia EXAM: CT HEAD WITHOUT CONTRAST TECHNIQUE: Contiguous axial images were obtained from the base of the skull through the vertex without intravenous contrast. COMPARISON:  02/01/2016 FINDINGS: Bony calvarium is intact. No findings to suggest acute hemorrhage, acute infarction or space-occupying mass lesion is seen. Some gyral swelling is noted similar to that seen on the prior exam in the left posterior parietal region. This is stable from the prior exam. No acute abnormality is noted. IMPRESSION: No significant change from the prior exam. No acute hemorrhage is noted. Electronically Signed   By: Inez Catalina M.D.   On: 02/06/2016 14:09   Ct Abdomen W Contrast  02/05/2016  CLINICAL DATA:  60 year old female inpatient with epigastric/left abdominal pain. Splenic infarction on recent CT. EXAM: CT ABDOMEN WITH CONTRAST TECHNIQUE: Multidetector CT imaging of the abdomen was performed using the standard protocol following bolus administration of intravenous contrast. CONTRAST:  100 cc Omnipaque 300 IV. COMPARISON:  01/14/2016 CT abdomen/ pelvis. FINDINGS: Lower chest: No significant pulmonary nodules or acute consolidative airspace disease. There is stable mosaic attenuation at the lung bases. Stable trace layering right pleural effusion. Hepatobiliary: Diffuse hepatic steatosis. No liver mass. No definite liver surface irregularity. Normal gallbladder with no radiopaque cholelithiasis. No biliary ductal dilatation. Pancreas: Normal, with no mass or duct dilation. Spleen: There is new mild splenomegaly (craniocaudal splenic length 14.9 cm, increased from 12.6 cm on 02/29/2017). There is been continued evolution of the previously  described splenic infarct involving the upper 50% of the spleen, which is now all fluid density and distended. There is a new large splenic infarct in the lower 50% of the spleen. Only approximately 25% of the splenic parenchyma remains perfused. Splenic artery and celiac trunk appear patent. Adrenals/Urinary Tract: Normal adrenals. Simple 2.1 cm anterior upper and 1.5 cm lower left renal cysts. No hydronephrosis. No new renal lesions. Stomach/Bowel: Grossly normal stomach. Visualized small and large bowel is normal caliber, with no bowel wall thickening. Vascular/Lymphatic: Atherosclerotic nonaneurysmal abdominal aorta. Patent portal, splenic, hepatic and renal veins. No pathologically enlarged lymph nodes in the abdomen. Other: No pneumoperitoneum, ascites or focal fluid collection. Musculoskeletal: Re- demonstrated are endplate erosions at 579FGE (series 6/ image 125), not appreciably changed since 01/14/2016, although new since 06/18/2012. Moderate degenerative changes in the visualized thoracolumbar spine. IMPRESSION: 1. 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 Evolution of the previously described splenic infarct involving the upper 50% of the spleen. Spleen is now mildly enlarged. Cardiac embolic source remains the most likely etiology. Splenic artery and vein appear patent. 2. Stable endplate erosions at 579FGE, new since 2013, nonspecific. Consider further evaluation with thoracic spine  MRI. 3. Diffuse hepatic steatosis. 4. Mosaic attenuation at the lung bases, favor mosaic perfusion due to chronic pulmonary arterial hypertension. Electronically Signed   By: Ilona Sorrel M.D.   On: 02/05/2016 08:16   Portable Chest Xray  02/06/2016  CLINICAL DATA:  Acute respiratory failure. EXAM: PORTABLE CHEST 1 VIEW COMPARISON:  Earlier the same day FINDINGS: New endotracheal tube is in good position, tip between the clavicular heads and carina. Diffuse interstitial coarsening with  small left effusion. Chronic cardiomegaly. IMPRESSION: 1. New endotracheal tube in good position. 2. CHF pattern with modest progression from earlier today. Small left effusion. Electronically Signed   By: Monte Fantasia M.D.   On: 02/06/2016 22:38   Dg Chest Port 1 View  02/06/2016  CLINICAL DATA:  Shortness of breath today. EXAM: PORTABLE CHEST 1 VIEW COMPARISON:  02/01/2016 FINDINGS: The heart is enlarged but stable. There is tortuosity, ectasia and calcification of the thoracic aorta. There is vascular congestion and interstitial pulmonary edema. No definite pleural effusions. The bony thorax is intact. IMPRESSION: Mild CHF. Electronically Signed   By: Marijo Sanes M.D.   On: 02/06/2016 15:19    Labs:  CBC:  Recent Labs  01/31/16 2232 01/31/16 2233 02/03/16 1110 02/05/16 0322 02/07/16 0545  WBC 17.9*  --  15.5* 14.2* 24.5*  HGB 13.0 14.6 12.3 12.9 10.4*  HCT 40.9 43.0 41.1 41.5 34.9*  PLT 478*  --  474* 398 437*    COAGS:  Recent Labs  01/31/16 2232 02/07/16 0545  INR 2.16* 1.32  APTT 36 27    BMP:  Recent Labs  01/31/16 2232 01/31/16 2233 02/03/16 0032 02/05/16 0322 02/07/16 0545  NA 131* 132* 134* 134* 136  K 4.9 4.6 4.8 4.3 4.3  CL 94* 94* 98* 99* 101  CO2 26  --  26 25 27   GLUCOSE 215* 211* 198* 204* 180*  BUN 13 15 20 10 6   CALCIUM 10.2  --  11.0* 10.2 10.0  CREATININE 1.13* 1.10* 0.92 0.91 0.84  GFRNONAA 52*  --  >60 >60 >60  GFRAA >60  --  >60 >60 >60    LIVER FUNCTION TESTS:  Recent Labs  01/15/16 0543 01/31/16 2232 02/05/16 0322 02/07/16 0545  BILITOT 0.6 0.5 0.7 0.2*  AST 16 18 15  11*  ALT 15 12* 14 10*  ALKPHOS 78 75 76 67  PROT 6.5 7.2 6.9 5.9*  ALBUMIN 2.7* 3.1* 2.8* 2.3*    Assessment and Plan:  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

## 2016-02-07 NOTE — Progress Notes (Signed)
PULMONARY / CRITICAL CARE MEDICINE   Name: Margaret [name] MRN: [id number] DOB: 1956-11-04    ADMISSION DATE:  02/01/2016 CONSULTATION DATE:  3/16  REFERRING MD:  TRH  CHIEF COMPLAINT:  CVA s/p neuro IR  HISTORY OF PRESENT ILLNESS:  60 year old female with past medical history as below, which includes hypertension, hyperlipidemia, COPD, diabetes mellitus, and CHF. She recently admitted in February of this year for acute splenic infarction without clear etiology, however at that time she was also diagnosed with hepatic cirrhosis and he was initially thought this was the cause of the infarct as echocardiogram the time showed no cardiac source of emboli. She was started on anticoagulation and was slated to obtain outpatient TEE.  She returned to the hospital on 3/11 with fever, headache and a small subarachnoid bleed as well as several small "punctate infarcts" consistent with embolic events.  Blood cultures were positive for strep viridans at that point.  TEE showed a 2cm vegetation on the mitral valve with severe mitral regurgitation.  Cardiothoracic surgery was consulted on 3/16.    On 3/16 she had a mechanical fall and had a CT head that was read as normal around 1400.  Apparently she was given ativan.  After return from CT imaging she was noted to have left facial droop, slurred speech around 4PM.  Stroke was called, CT head repeated, then IR was consulted for possible cerebral angiogram.  She underwent an angiogram showing a R MCA embolism and occluded right internal carotid artery embolism.  These were retrieved and then she underwent selective intra-arterial integrilin infusion.    She returned from the procedure intubated and hypertensive  SUBJECTIVE:  As above  VITAL SIGNS: BP 120/74 mmHg  Pulse 85  Temp(Src) 98.3 F (36.8 C) (Oral)  Resp 21  Ht 5\' 5"  (1.651 m)  Wt 123.2 kg (271 lb 9.7 oz)  BMI 45.20 kg/m2  SpO2 100%  HEMODYNAMICS:    VENTILATOR SETTINGS: Vent Mode:   [-] PRVC FiO2 (%):  [50 %-100 %] 50 % Set Rate:  [14 bmp] 14 bmp Vt Set:  [460 mL] 460 mL PEEP:  [5 cmH20] 5 cmH20 Plateau Pressure:  [23 cmH20-27 cmH20] 23 cmH20  INTAKE / OUTPUT: I/O last 3 completed shifts: In: 3732.1 [P.O.:120; I.V.:3412.1; IV Piggyback:200] Out: 2200 [Urine:2100; Blood:100]  PHYSICAL EXAMINATION: Gen. Obese, intubated, in no distress, ENT -  no post nasal drip, Oral ET tube Neck: No JVD, no thyromegaly, no carotid bruits Lungs: no use of accessory muscles, no dullness to percussion, decreased without rales or rhonchi  Cardiovascular: Rhythm regular, heart sounds  normal, no murmurs, 1+ peripheral edema Abdomen: soft and non-tender, no hepatosplenomegaly, BS normal. Musculoskeletal: No deformities, no cyanosis or clubbing Neuro:  Sedated, unable to examine for focality Skin:  Warm, no lesions/ rash  LABS:  BMET  Recent Labs Lab 02/03/16 0032 02/05/16 0322 02/07/16 0545  NA 134* 134* 136  K 4.8 4.3 4.3  CL 98* 99* 101  CO2 26 25 27   BUN 20 10 6   CREATININE 0.92 0.91 0.84  GLUCOSE 198* 204* 180*    Electrolytes  Recent Labs Lab 02/03/16 0032 02/05/16 0322 02/07/16 0545  CALCIUM 11.0* 10.2 10.0    CBC  Recent Labs Lab 02/03/16 1110 02/05/16 0322 02/07/16 0545  WBC 15.5* 14.2* 24.5*  HGB 12.3 12.9 10.4*  HCT 41.1 41.5 34.9*  PLT 474* 398 437*    Coag's  Recent Labs Lab 01/31/16 2232 02/07/16 0545  APTT 36 27  INR 2.16*  1.32    Sepsis Markers No results for input(s): LATICACIDVEN, PROCALCITON, O2SATVEN in the last 168 hours.  ABG  Recent Labs Lab 02/06/16 1700 02/07/16 0003  PHART 7.430 7.310*  PCO2ART 43.6 59.3*  PO2ART 84.5 379.0*    Liver Enzymes  Recent Labs Lab 01/31/16 2232 02/05/16 0322 02/07/16 0545  AST 18 15 11*  ALT 12* 14 10*  ALKPHOS 75 76 67  BILITOT 0.5 0.7 0.2*  ALBUMIN 3.1* 2.8* 2.3*    Cardiac Enzymes  Recent Labs Lab 02/05/16 0322 02/05/16 0802 02/05/16 1448  TROPONINI 0.04*  0.04* 0.05*    Glucose  Recent Labs Lab 02/05/16 1658 02/05/16 2138 02/06/16 0753 02/06/16 1110 02/06/16 1600 02/07/16 0739  GLUCAP 145* 147* 177* 216* 183* 150*    Imaging Ct Head Wo Contrast  02/07/2016  CLINICAL DATA:  Follow-up exam for acute stroke, status post catheter directed revascularization. EXAM: CT HEAD WITHOUT CONTRAST TECHNIQUE: Contiguous axial images were obtained from the base of the skull through the vertex without intravenous contrast. COMPARISON:  Prior CT from 02/06/2016 as well as multiple previous studies. FINDINGS: Previously identified small volume acute subarachnoid hemorrhage within the right parieto-occipital region is slightly less conspicuous on today's study. Additionally, known subarachnoid hemorrhage/ debris within the left occipital region not well seen. No new intracranial hemorrhage. No definite evolving large vessel territory infarct identified. No mass lesion or midline shift. No mass effect. No hydrocephalus. No extra-axial fluid collection. Scalp soft tissues within normal limits. No acute abnormality about the orbits. Scattered fluid levels with opacity within the sphenoid ethmoidal sinuses. Patient appears to be intubated. No mastoid effusion. Calvarium intact. IMPRESSION: 1. Interval decrease in conspicuity of small volume right parietal and left parieto-occipital hemorrhage. No new intracranial hemorrhage identified. 2. No definite acute or evolving infarct identified by CT. Electronically Signed   By: Jeannine Boga M.D.   On: 02/07/2016 03:20   Ct Head Wo Contrast  02/06/2016  CLINICAL DATA:  She stroke. Left facial droop, left hemiplegia, slurred speech, left neglect, and right gaze. Status post cerebral angiography and revascularization of occluded right ICA terminus and right MCA. EXAM: CT HEAD WITHOUT CONTRAST TECHNIQUE: Contiguous axial images were obtained from the base of the skull through the vertex without intravenous contrast.  COMPARISON:  Head CT 1743 hours today FINDINGS: Small volume right parietal subarachnoid hemorrhage is unchanged, as is small volume residual left parieto-occipital subarachnoid hemorrhage. No definite edema suggestive of acute infarct is identified. No mass, midline shift, or extra-axial fluid collection is seen. Orbits are unremarkable. An endotracheal tube is partially visualized, with secretions noted in the posterior nasal cavity and pharynx. Mild mucosal thickening/small volume secretions are also noted in the right sphenoid sinus and right frontal sinus. Mastoid air cells are clear. IMPRESSION: 1. Unchanged small volume right parietal and left parieto-occipital subarachnoid hemorrhage. 2. No acute infarct apparent by CT. Electronically Signed   By: Logan Bores M.D.   On: 02/06/2016 21:59   Ct Head Wo Contrast  02/06/2016  CLINICAL DATA:  Stroke.  Left-sided weakness.  Endocarditis. EXAM: CT HEAD WITHOUT CONTRAST TECHNIQUE: Contiguous axial images were obtained from the base of the skull through the vertex without intravenous contrast. COMPARISON:  Head CT 02/06/2016 at 1400 hours. Head MRI and CT 02/01/2016. FINDINGS: There is a subcentimeter focus of hyperattenuation associated with a right parietal sulcus (series 2, image 27) which may reflect trace subarachnoid hemorrhage/ proteinaceous debris. This is unchanged from today's earlier CT but is new from 02/01/2016. Left parietal  subarachnoid hemorrhage/debris on the 02/01/2016 CT is much less conspicuous now. There is no evidence of acute large territory infarct, mass, midline shift, or extra-axial fluid collection. The punctate acute infarcts on the recent prior MRI are not well seen by CT. There is mild generalized cerebral atrophy. Orbits are unremarkable. Minimal paranasal sinus mucosal thickening is noted. The mastoid air cells are clear. No acute osseous abnormality is identified. Calcified atherosclerosis is noted at the skull base. IMPRESSION: 1.  Decreased conspicuity of left parieto-occipital subarachnoid hemorrhage/debris compared to the studies from 02/01/2016. 2. Small focus of subarachnoid hemorrhage/debris in a right parietal sulcus, unchanged from today's earlier study but new from 02/01/2016. This may reflect a new focus of hemorrhage or redistribution of the pre-existing blood products. Electronically Signed   By: Logan Bores M.D.   On: 02/06/2016 18:26   Ct Head Wo Contrast  02/06/2016  CLINICAL DATA:  Slip and fall today with slurred speech and hypoxia EXAM: CT HEAD WITHOUT CONTRAST TECHNIQUE: Contiguous axial images were obtained from the base of the skull through the vertex without intravenous contrast. COMPARISON:  02/01/2016 FINDINGS: Bony calvarium is intact. No findings to suggest acute hemorrhage, acute infarction or space-occupying mass lesion is seen. Some gyral swelling is noted similar to that seen on the prior exam in the left posterior parietal region. This is stable from the prior exam. No acute abnormality is noted. IMPRESSION: No significant change from the prior exam. No acute hemorrhage is noted. Electronically Signed   By: Inez Catalina M.D.   On: 02/06/2016 14:09   Portable Chest Xray  02/06/2016  CLINICAL DATA:  Acute respiratory failure. EXAM: PORTABLE CHEST 1 VIEW COMPARISON:  Earlier the same day FINDINGS: New endotracheal tube is in good position, tip between the clavicular heads and carina. Diffuse interstitial coarsening with small left effusion. Chronic cardiomegaly. IMPRESSION: 1. New endotracheal tube in good position. 2. CHF pattern with modest progression from earlier today. Small left effusion. Electronically Signed   By: Monte Fantasia M.D.   On: 02/06/2016 22:38   Dg Chest Port 1 View  02/06/2016  CLINICAL DATA:  Shortness of breath today. EXAM: PORTABLE CHEST 1 VIEW COMPARISON:  02/01/2016 FINDINGS: The heart is enlarged but stable. There is tortuosity, ectasia and calcification of the thoracic aorta.  There is vascular congestion and interstitial pulmonary edema. No definite pleural effusions. The bony thorax is intact. IMPRESSION: Mild CHF. Electronically Signed   By: Marijo Sanes M.D.   On: 02/06/2016 15:19     STUDIES:  3/11 CT head > localized subarachnoid hemorrhage/debris in left parietal occipital region 3/11 CT angiogram> mild intracranial and extracranial atherosclerosis without stenosis, patent dural venous sinuses, no vascular malformation 3/11 MRI/MRA brain > small volume subarachnoid hemorrhage/debri left parieto-occipital region, few sub cm cortical subcortical ischemic infarcts in bilateral parietal lobes, negative MRA 3/14 EEG > no evidence of epileptiform discharges 3/15 TEE> 2 cm mitral valve vegetation with regurgitation 3/15 CT ab/pelvis> new splenic infarct, larger than before, enlarged spleen, diffuse hepatic steatosis, mosaic attenuation of lung bases, consider pulmonary arterial hypertension 3/16 CT head > No significant change from prior 3/16 CT head> decreased conspicuity of left parieto-occipital subarachnoid hemorrhage; ? 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  Also found to have hepatic steatosis.  On 3/16 she underwent emergent cerebral angiogram and clot retrieval from the R ICA  artery and R MCA artery, returned from IR on the vent  ASSESSMENT / PLAN:  NEUROLOGIC A:   Acute ischemic stroke : R MCA and R ICA occlusion from emboli Subarachnoid hemorrhage on admission, ? From punctate emboli P:   RASS goal: -1 Propofol gtt Prn fentanyl Post op stroke orderset per stroke team Note Head ct from this am, will go for MRI Brain am 3/17  PULMONARY A: Acute respiratory failure on vent P:   Full vent support VAP bundle Follow CXR SBT/WUA daily, no plan extubate until sheath out and MRI brain done.   CARDIOVASCULAR A:  Acute R MCA ischemic stroke Mildly elevated troponin in setting of stroke Mitral valve regurgitation and endocarditis P:  BP goal per neurology SBP 120-140 Prn labetalol for hypertension Valve repair under evaluation per TCTS. The fallout from acute CVA will affect plan, timing, etc.   RENAL A: Mild hyponatremia, improving P:   Monitor BMET and UOP Replace electrolytes as needed   GASTROINTESTINAL A:   No acute issues P:   Pepcid for stress ulcer prophylaxis NPO  HEMATOLOGIC A:   No acute issues P:  Monitor for bleeding SCD given subarachnoid hemorrhage and new ischemic stroke  INFECTIOUS A:   Spontaneous bacterial endocarditis with strep viridans P:   Continue ampicillin F/u ID recs  ENDOCRINE A:   Hyperglycemia P:   Target glucose 140-180 with acute ischemic stroke   FAMILY  - Updates: Husband at bedside 3/16 pm  - Inter-disciplinary family meet or Palliative Care meeting due by:  3/23   Summary - appears to be 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 We will assess neurological deficits and decide whether she is a candidate for eventual mitral valve replacement.  Independent CC time 33 minutes   Baltazar Apo, MD, PhD 02/07/2016, 9:51 AM [facility] [phone] or if no answer [phone]

## 2016-02-07 NOTE — Progress Notes (Signed)
OT Cancellation Note  Patient Details Name: Margaret [name] MRN: [id number] DOB: 1955/12/14   Cancelled Treatment:    Reason Eval/Treat Not Completed: Medical issues which prohibited therapy. Decline in status 3/16 developing L hemiparesis/L neglect and R gaze. Underwent an angiogram showing a R MCA embolism and occluded right internal carotid artery embolism. IR for retrieval and selective intra-arterial integrilin infusion. Currently on bedrest. Please update activity orders when pt is appropriate to resume therapy. Thanks  [place], OTR/L  [phone] 02/07/2016 02/07/2016, 7:34 AM

## 2016-02-07 NOTE — Progress Notes (Signed)
[facility]   Reason for visit: Follow up on Strep viridans endocarditis  Interval History: no fever, no new complaints.  TEE with vegetation.  Repeat blood cultures negative to date.   Events noted from yesterday, R MCA embolism and occluded right internal carotid artery.  Has been seen by Dr. Roxy Manns.    Physical Exam: Constitutional:  Filed Vitals:   02/07/16 1300 02/07/16 1400  BP: 122/66 120/61  Pulse: 78 82  Temp:    Resp: 21 21  intubated, sedated Respiratory: CTA B, anterior exam, on vent Cardiovascular: RRR GI: obese, nt  Review of Systems: Constitutional: negative for fevers and chills Gastrointestinal: negative for diarrhea  Lab Results  Component Value Date   WBC 24.5* 02/07/2016   HGB 10.4* 02/07/2016   HCT 34.9* 02/07/2016   MCV 89.3 02/07/2016   PLT 437* 02/07/2016    Lab Results  Component Value Date   CREATININE 0.84 02/07/2016   BUN 6 02/07/2016   NA 136 02/07/2016   K 4.3 02/07/2016   CL 101 02/07/2016   CO2 27 02/07/2016    Lab Results  Component Value Date   ALT 10* 02/07/2016   AST 11* 02/07/2016   ALKPHOS 67 02/07/2016     Microbiology: Recent Results (from the past 240 hour(s))  Culture, blood (Routine X 2) w Reflex to ID Panel     Status: None   Collection Time: 02/01/16  7:45 AM  Result Value Ref Range Status   Specimen Description BLOOD RIGHT ARM  Final   Special Requests BOTTLES DRAWN AEROBIC AND ANAEROBIC 10CC  Final   Culture  Setup Time   Final    GRAM POSITIVE COCCOBACILLUS IN BOTH AEROBIC AND ANAEROBIC BOTTLES CRITICAL RESULT CALLED TO, READ BACK BY AND VERIFIED WITH: C SOSA 02/02/16 @ 1014 M VESTAL    Culture   Final    VIRIDANS STREPTOCOCCUS SUSCEPTIBILITIES PERFORMED ON PREVIOUS CULTURE WITHIN THE LAST 5 DAYS.    Report Status 02/04/2016 FINAL  Final  Culture, blood (Routine X 2) w Reflex to ID Panel     Status: None   Collection Time: 02/01/16  8:00 AM  Result Value Ref Range Status   Specimen Description BLOOD LEFT HAND  Final   Special Requests BOTTLES DRAWN AEROBIC AND ANAEROBIC 10CC  Final   Culture  Setup Time   Final    GRAM POSITIVE COCCOBACILLUS IN BOTH AEROBIC AND ANAEROBIC BOTTLES CRITICAL RESULT CALLED TO, READ BACK BY AND VERIFIED WITH: C SOSA 02/02/16 @ 1014 M VESTAL    Culture VIRIDANS STREPTOCOCCUS  Final   Report Status 02/04/2016 FINAL  Final   Organism ID, Bacteria VIRIDANS STREPTOCOCCUS  Final      Susceptibility   Viridans streptococcus - MIC*    PENICILLIN <=0.06 SENSITIVE Sensitive     CEFTRIAXONE <=0.12 SENSITIVE Sensitive     ERYTHROMYCIN <=0.12 SENSITIVE Sensitive     LEVOFLOXACIN 0.5 SENSITIVE Sensitive     VANCOMYCIN 0.5 SENSITIVE Sensitive     * VIRIDANS STREPTOCOCCUS  MRSA PCR Screening     Status: None   Collection Time: 02/01/16 12:54 PM  Result Value Ref Range Status   MRSA by PCR NEGATIVE NEGATIVE Final    Comment:        The GeneXpert MRSA Assay (FDA approved for NASAL specimens only), is one component of a comprehensive MRSA colonization surveillance program. It is not intended to diagnose MRSA infection nor to guide or monitor treatment for MRSA infections.  Culture, blood (routine x 2)     Status: None (Preliminary result)   Collection Time: 02/04/16 12:36 PM  Result Value Ref Range Status   Specimen Description BLOOD  Final   Special Requests IN PEDIATRIC BOTTLE 3CC  Final   Culture NO GROWTH 3 DAYS  Final   Report Status PENDING  Incomplete  Culture, blood (routine x 2)     Status: None (Preliminary result)   Collection Time: 02/04/16 12:48 PM  Result Value Ref Range Status   Specimen Description BLOOD  Final   Special Requests IN PEDIATRIC BOTTLE 3CC  Final   Culture NO GROWTH 3 DAYS  Final   Report Status PENDING  Incomplete    Impression/Plan:  1. Strep viridans native mitral valve endocarditis - 2 cm, + mitral regurgitation. On ampicillin 2. Splenic infarct - most consistent with emboli from Strep.  Also  cerebral infarcts, likely also Strep.  3. Cerebral/arterial emboli - now in ICU, intubated.  Dr. Baxter Flattery will follow over the weekend

## 2016-02-07 NOTE — Progress Notes (Signed)
Patient noted to have  seizure like activity in right arm. Black Box notified. Propofol increased as long as patients BP tolerates.

## 2016-02-07 NOTE — Progress Notes (Signed)
Initial Nutrition Assessment  DOCUMENTATION CODES:   Obesity unspecified  INTERVENTION:  - Recommend initiating Vital High Protein @ 25 ml/h increasing by 10 ml every 4 hours until goal rate of 55 ml/hr is met.  -Recommend 30 ml Prostat, BID  -Propofol @ 11.1, 293 kcal   Provides 1813 kcal, 145 grams of protein, 1103 ml of water.  NUTRITION DIAGNOSIS:   Inadequate oral intake related to inability to eat as evidenced by NPO status.  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 Also found to have hepatic steatosis. On 3/16 she underwent emergent cerebral angiogram and clot retrieval from the R ICA artery and R MCA artery, returned from IR on the vent  3/16-Intubated on ventilator support, NPO   MV:9.7 L/min Temp: 36.8C  Propofol: 11.1 ml/h  (293 kcal)   Spoke with pt's husband at bedside. Pt's husband was very concerned about lack of nutrition, he reports she has not had anything in the past 4 days. He states he would bring in pt's favorite foods, such as bagels, and pt refused to eat them. Husband states that she usually has a great appetite, the shift in her appetite is concerning him.   Conducted nutrition focused physical exam, identified no muscle wasting, no fat wasting.   Medications reviewed. Labs reviewed.  Diet Order:  Diet NPO time specified  Skin:  Reviewed, no issues  Last BM:  02/04/2016  Height:   Ht Readings from Last 1 Encounters:  02/02/16 '5\' 5"'$  (1.651 m)    Weight:   Wt Readings from Last 1 Encounters:  02/06/16 271 lb 9.7 oz (123.2 kg)    Ideal Body Weight:  56.8 kg  BMI:  Body mass index is 45.2 kg/(m^2).  Estimated Nutritional Needs:    Kcal:  1355-1725  Protein:  140-150 grams   Fluid:  1.3- 1.7  EDUCATION NEEDS:   No education needs identified at this time  Raford Pitcher, Dietetic Intern Pager: [phone]

## 2016-02-07 NOTE — Procedures (Signed)
Pt transported from 3M05 to MRI then back to [id number] without complications.

## 2016-02-07 NOTE — Progress Notes (Signed)
STROKE TEAM PROGRESS NOTE   SUBJECTIVE (INTERVAL HISTORY) Pt still intubated and sedated. Off sedation, pt is able to open eyes on voice, spontaneously moving right UE and RLE, but LUE flaccid and minimal movement LLE. MRI not done yet.   OBJECTIVE Temp:  [97.3 F (36.3 C)-98.7 F (37.1 C)] 97.3 F (36.3 C) (03/17 1600) Pulse Rate:  [78-90] 80 (03/17 1936) Cardiac Rhythm:  [-] Normal sinus rhythm (03/17 2000) Resp:  [15-25] 20 (03/17 2000) BP: (101-148)/(54-111) 125/93 mmHg (03/17 2000) SpO2:  [96 %-100 %] 100 % (03/17 2000) Arterial Line BP: (82-150)/(53-79) 105/74 mmHg (03/17 1600) FiO2 (%):  [40 %-100 %] 40 % (03/17 2000) Weight:  [271 lb 9.7 oz (123.2 kg)] 271 lb 9.7 oz (123.2 kg) (03/16 2200)  CBC:   Recent Labs Lab 01/31/16 2232  02/05/16 0322 02/07/16 0545  WBC 17.9*  < > 14.2* 24.5*  NEUTROABS 15.2*  --  10.9*  --   HGB 13.0  < > 12.9 10.4*  HCT 40.9  < > 41.5 34.9*  MCV 87.8  < > 88.3 89.3  PLT 478*  < > 398 437*  < > = values in this interval not displayed.  Basic Metabolic Panel:   Recent Labs Lab 02/05/16 0322 02/07/16 0545  NA 134* 136  K 4.3 4.3  CL 99* 101  CO2 25 27  GLUCOSE 204* 180*  BUN 10 6  CREATININE 0.91 0.84  CALCIUM 10.2 10.0    Lipid Panel:     Component Value Date/Time   CHOL 155 02/02/2016 1510   TRIG 137 02/02/2016 1510   HDL 21* 02/02/2016 1510   CHOLHDL 7.4 02/02/2016 1510   VLDL 27 02/02/2016 1510   LDLCALC 107* 02/02/2016 1510   HgbA1c:  Lab Results  Component Value Date   HGBA1C 8.4* 02/02/2016   Urine Drug Screen: No results found for: LABOPIA, COCAINSCRNUR, LABBENZ, AMPHETMU, THCU, LABBARB    IMAGING I have personally reviewed the radiological images below and agree with the radiology interpretations.  Dg Chest 2 View 02/01/2016    Cardiomegaly and pulmonary venous congestion.   Ct Head Wo Contrast 02/01/2016   Localized subarachnoid hemorrhage/debris in the left parietal occipital region as discussed  above. MRI/MRA recommended.   Mri and Mra Head Wo Contrast 02/01/2016   1. Small volume subarachnoid hemorrhage/debris within the left parieto-occipital region, corresponding to abnormality seen on prior CT. Minimal gyral swelling within this region without significant mass effect or other abnormality.  2. Few sub cm cortical/subcortical ischemic infarcts within the bilateral parietal lobes as above.  3. Negative intracranial MRA.   CTA of head and neck 02/01/2016 1. Mild intracranial and extracranial atherosclerosis without significant stenosis. 2. Patent dural venous sinuses. No vascular malformation identified.  LE venous doppler - Bilateral: No evidence of DVT, superficial thrombosis, or Baker's Cyst.  EEG - Unremarkable awake and drowsy routine inpatient EEG. Clinical correlation is recommended .   TEE - Normal LV size with EF 60-65%. Normal wall motion. Normal RV size and systolic function. Trivial TR, no TV vegetation. No PV vegetation. Trileaflet aortic valve with no stenosis or regurgitation, no vegetation. There was a mobile vegetation on the posterior leaflet of the mitral valve measuring about 2 cm in greatest dimension. The posterior and anterior leaflets of the MV did not coapt properly and there was very eccentric, anteriorly-directed mitral regurgitation. I suspect severe MR. Jet was too eccentric for PISA calculations. There was systolic flattening but not flow reversal in the pulmonary vein  doppler pattern. The left atrium was mildly dilated, no LA appendage thrombus. Normal right atrium. No evidence for PFO or ASD. Normal caliber aorta with mild plaque in the descending thoracic aorta.  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  Ct Head Wo Contrast 02/07/2016  IMPRESSION: Acute infarct posterior limb internal capsule Small area of subarachnoid hemorrhage right parietal area stable. No new  area of hemorrhage. Electronically Signed   By: Franchot Gallo M.D.   On: 02/07/2016 18:44   MRI and MRA - pending   PHYSICAL EXAM  Temp:  [97.3 F (36.3 C)-98.7 F (37.1 C)] 97.3 F (36.3 C) (03/17 1600) Pulse Rate:  [78-90] 80 (03/17 1936) Resp:  [15-25] 20 (03/17 2000) BP: (101-148)/(54-111) 125/93 mmHg (03/17 2000) SpO2:  [96 %-100 %] 100 % (03/17 2000) Arterial Line BP: (82-150)/(53-79) 105/74 mmHg (03/17 1600) FiO2 (%):  [40 %-100 %] 40 % (03/17 2000) Weight:  [271 lb 9.7 oz (123.2 kg)] 271 lb 9.7 oz (123.2 kg) (03/16 2200)  General - morbid obesity, well developed, intubated and sedated.  Ophthalmologic - Fundi not visualized due to noncooperation  Cardiovascular - Regular rate and rhythm.  Neuro - intubated and sedated. Briefly off sedation, eyes open to voice, but not following commands. Eyes mid position, PERRL, no eyes deviation, positive corneal and gag, positive doll's eyes. RUE and RLE spontaneously movement, with pain stimulation able to against gravity. LUE flaccid and LLE mild withdraw to pain. DTR 1+, no babinski.   ASSESSMENT/PLAN Margaret [name] is a 60 y.o. female with history of hypertension, hyperlipidemia, COPD, asthma, diabetes mellitus, migraines, panic attacks, and morbid obesity, presenting with mild headache, visual changes, low-grade fevers, and nausea. She did not receive IV t-PA due to late presentation and anticoagulation.  Acute stroke due to right ICA acute occlusion s/p endovascular thrombectomy - likely septic emboli  Acute neuro changes 02/06/16 with left hemiplegia, right gaze and left neglect  S/p emergency thrombectomy with TICI 3 recannulization  Intubated and sedated  CT repeat showed only right BG infarct  MRI and MRA pending  No antiplatelet or anticoagulation needed due to endocarditis  Small SAH within the left parieto-occipital region, and 4 punctate infarcts at the watershed area including left MCA/PCA, bilateral  MCA/ACA, likely due to endocarditis. No mycotic aneurysm found on CTA. CTV has ruled out CVT.   Resultant asymptomatic  MRI - left parieto-occipital small SAH, punctate infarcts at the watershed area (left MCA/PCA, b/l MCA/ACA)  MRA - negative  CTA head and neck - no cerebral venous thrombosis, AVM, or aneurysm.  2D Echo - EF 60-65% in 12/2015  LE venous doppler - negative for DVT  EEG - negative   TEE - MV endocarditis and MV regurgitation  LDL - 107  HgbA1c 8.4  Hypercoagulable work up positive lupus anticoagulant likely due to Xarelto use PTA. However, ANA and RF positive, need repeat as outpt  VTE prophylaxis - SCDs Diet NPO time specified  Xarelto (rivaroxaban) daily prior to admission, now on No antithrombotic secondary to [facility]. Due to endocarditis, no antiplatelet or anticoagulation indicated at this time.  Patient counseled to be compliant with her antithrombotic medications  Ongoing aggressive stroke risk factor management  MV endocarditis with septic emboli - explains spleen infarct, SAH and punctate embolic strokes  TEE showed MV endocarditis and MV regurgitation  CTA head and neck showed no mycotic aneurysm  Blood culture 2/2 strep vividan positive  ID on bard   Elevated  WBC  On Rocephin  Repeat BCx NGTD  CTS consult pending to evaluate if surgical intervention needed.  Hypertension  Stable mildly low blood pressures  Avoid hypotension  Hyperlipidemia  Home meds:  Lovastatin 20 mg daily resumed in hospital  LDL 107, goal < 70  On pravastatin 20mg  now  Continue statin at discharge  Diabetes  HgbA1c 8.4, goal < 7.0  Uncontrolled  On lantus  SSI  Other Stroke Risk Factors  Advanced age  Cigarette smoker, quit smoking 3 years ago.  Morbid obesity, Body mass index is 45.2 kg/(m^2).   Hx stroke/TIA  Family hx stroke (father)  Migraines  PVD - aortic athrosclerosis  Other Active Problems  Leukocytosis  Elevated  creatinine 1.10  Mild hyponatremia  Pulmonary HTN  Hilar lymphoadenopathy  Positive ANA and RF, need to repeat as outpt. Rheumatology referral if needed.   Hospital day # 6   This patient is critically ill due to acute ICA occlusion, infarct s/p thrombectomy, endocarditis and at significant risk of neurological worsening, death form recurrent infarct, vessel occlusion, hemorrhagic infarct, SAH or ICH, sepsis, seizure. This patient's care requires constant monitoring of vital signs, hemodynamics, respiratory and cardiac monitoring, review of multiple databases, neurological assessment, discussion with family, other specialists and medical decision making of high complexity. I spent 45 minutes of neurocritical care time in the care of this patient.   Rosalin Hawking, MD PhD Stroke Neurology 02/07/2016 8:20 PM     To contact Stroke Continuity provider, please refer to [email or web address] After hours, contact General Neurology

## 2016-02-08 ENCOUNTER — Inpatient Hospital Stay (HOSPITAL_COMMUNITY): Payer: Medicaid Other

## 2016-02-08 DIAGNOSIS — I63412 Cerebral infarction due to embolism of left middle cerebral artery: Secondary | ICD-10-CM

## 2016-02-08 DIAGNOSIS — D72829 Elevated white blood cell count, unspecified: Secondary | ICD-10-CM

## 2016-02-08 LAB — BASIC METABOLIC PANEL
Anion gap: 13 (ref 5–15)
BUN: 7 mg/dL (ref 6–20)
CHLORIDE: 98 mmol/L — AB (ref 101–111)
CO2: 28 mmol/L (ref 22–32)
Calcium: 10.4 mg/dL — ABNORMAL HIGH (ref 8.9–10.3)
Creatinine, Ser: 0.88 mg/dL (ref 0.44–1.00)
GFR calc non Af Amer: >60 mL/min (ref >60)
Glucose, Bld: 153 mg/dL — ABNORMAL HIGH (ref 65–99)
Potassium: 3.9 mmol/L (ref 3.5–5.1)
Sodium: 139 mmol/L (ref 135–145)

## 2016-02-08 LAB — CBC
HEMATOCRIT: 36.4 % (ref 36.0–46.0)
HEMOGLOBIN: 10.9 g/dL — AB (ref 12.0–15.0)
MCH: 26.9 pg (ref 26.0–34.0)
MCHC: 29.9 g/dL — ABNORMAL LOW (ref 30.0–36.0)
MCV: 89.9 fL (ref 78.0–100.0)
PLATELETS: 454 10*3/uL — AB (ref 150–400)
RBC: 4.05 MIL/uL (ref 3.87–5.11)
RDW: 15.3 % (ref 11.5–15.5)
WBC: 20.9 10*3/uL — ABNORMAL HIGH (ref 4.0–10.5)

## 2016-02-08 LAB — GLUCOSE, CAPILLARY
Glucose-Capillary: 138 mg/dL — ABNORMAL HIGH (ref 65–99)
Glucose-Capillary: 151 mg/dL — ABNORMAL HIGH (ref 65–99)
Glucose-Capillary: 134 mg/dL — ABNORMAL HIGH (ref 65–99)
Glucose-Capillary: 137 mg/dL — ABNORMAL HIGH (ref 65–99)

## 2016-02-08 MED ORDER — CHLORHEXIDINE GLUCONATE 0.12 % MT SOLN
15.0000 mL | Freq: Two times a day (BID) | OROMUCOSAL | Status: DC
  Administered 2016-02-09 – 2016-02-14 (×11): 15 mL via OROMUCOSAL
  Filled 2016-02-08 (×8): qty 15

## 2016-02-08 MED ORDER — WHITE PETROLATUM GEL
Status: AC
Start: 2016-02-08 — End: 2016-02-08
  Administered 2016-02-08: 0.2
  Filled 2016-02-08: qty 1

## 2016-02-08 MED ORDER — CETYLPYRIDINIUM CHLORIDE 0.05 % MT LIQD
7.0000 mL | Freq: Two times a day (BID) | OROMUCOSAL | Status: DC
  Administered 2016-02-08 – 2016-02-13 (×8): 7 mL via OROMUCOSAL

## 2016-02-08 NOTE — Progress Notes (Signed)
[facility]  Patient Status: New pt for [facility] this admission.  [facility] hospital team will follow Mrs. Yeldell during this admission to support home care needs at time of DC.   If patient discharges after hours, please call [phone].   Larry Sierras 02/08/2016, 7:28 AM

## 2016-02-08 NOTE — Progress Notes (Signed)
Referring Physician(s): Rosalin Hawking Stroke  Supervising Physician: Estanislado Pandy  Chief Complaint: Follow up after cerebral angiogram   Subjective:  Patient remains intubated. Moves right arm and leg, does not open eyes.  Allergies: Codeine and Latex  Medications: Prior to Admission medications   Medication Sig Start Date End Date Taking? Authorizing Provider  albuterol (PROVENTIL HFA;VENTOLIN HFA) 108 (90 BASE) MCG/ACT inhaler Inhale 2 puffs into the lungs every 4 (four) hours as needed. For shortness of breath. 04/30/12  Yes Shanker Kristeen Mans, MD  albuterol (PROVENTIL) (2.5 MG/3ML) 0.083% nebulizer solution Take 2.5 mg by nebulization at bedtime.    Yes Historical Provider, MD  Alogliptin-Metformin HCl 12.03-999 MG TABS Take 1 tablet by mouth 2 (two) times daily.   Yes Historical Provider, MD  aspirin 81 MG chewable tablet Chew 81 mg by mouth daily.   Yes Historical Provider, MD  atenolol (TENORMIN) 50 MG tablet Take 12.5 mg by mouth 2 (two) times daily.    Yes Historical Provider, MD  cholecalciferol (VITAMIN D) 1000 units tablet Take 1,000 Units by mouth 2 (two) times daily.   Yes Historical Provider, MD  furosemide (LASIX) 40 MG tablet Take 40 mg by mouth daily.    Yes Historical Provider, MD  ibuprofen (ADVIL,MOTRIN) 200 MG tablet Take 600 mg by mouth every 6 (six) hours as needed for headache or mild pain.   Yes Historical Provider, MD  insulin NPH-regular (NOVOLIN 70/30) (70-30) 100 UNIT/ML injection Inject 6-16 Units into the skin 2 (two) times daily with a meal.   Yes Historical Provider, MD  loperamide (IMODIUM) 2 MG capsule Take 2 mg by mouth daily as needed for diarrhea or loose stools.   Yes Historical Provider, MD  lovastatin (MEVACOR) 20 MG tablet Take 10 mg by mouth at bedtime.   Yes Historical Provider, MD  naproxen sodium (ANAPROX) 220 MG tablet Take 220 mg by mouth 2 (two) times daily as needed (pain).   Yes Historical Provider, MD  nitroGLYCERIN (NITROSTAT) 0.4 MG  SL tablet Place 1 tablet (0.4 mg total) under the tongue every 5 (five) minutes x 3 doses as needed for chest pain. 06/20/12 02/01/16 Yes Jettie Booze, MD  oxyCODONE-acetaminophen (PERCOCET) 10-325 MG tablet Take 1 tablet by mouth 2 (two) times daily as needed for pain.   Yes Historical Provider, MD  Potassium 99 MG TABS Take 99 mg by mouth every morning.   Yes Historical Provider, MD  Rivaroxaban (XARELTO STARTER PACK) 15 & 20 MG TBPK Take as directed on package: Start with one 15mg  tablet by mouth twice a day with food. On Day 22, switch to one 20mg  tablet once a day with food. 01/16/16  Yes Maryann Mikhail, DO  spironolactone (ALDACTONE) 25 MG tablet Take 50 mg by mouth daily.    Yes Historical Provider, MD  ciprofloxacin (CIPRO) 500 MG tablet Take 1 tablet (500 mg total) by mouth 2 (two) times daily. Patient not taking: Reported on 02/01/2016 01/16/16   Velta Addison Mikhail, DO  metroNIDAZOLE (FLAGYL) 500 MG tablet Take 1 tablet (500 mg total) by mouth every 8 (eight) hours. Patient not taking: Reported on 02/01/2016 01/16/16   Cristal Ford, DO     Vital Signs: BP 115/54 mmHg  Pulse 78  Temp(Src) 98.7 F (37.1 C) (Axillary)  Resp 18  Ht 5\' 5"  (1.651 m)  Wt 271 lb 9.7 oz (123.2 kg)  BMI 45.20 kg/m2  SpO2 100%  Physical Exam   On vent Does not follow commands Moves right arm  and leg Groins ok, dressings in place.  No bleeding or hematoma.   Imaging: Ct Head Wo Contrast  02/07/2016  CLINICAL DATA:  Stroke. Post revascularization occluded right internal carotid artery terminus and proximal right MCA EXAM: CT HEAD WITHOUT CONTRAST TECHNIQUE: Contiguous axial images were obtained from the base of the skull through the vertex without intravenous contrast. COMPARISON:  CT head 02/07/2016 FINDINGS: Hypodensity right posterior limb internal capsule has become more apparent consistent with acute infarct. No other areas of acute infarct. Small volume subarachnoid hemorrhage difficult to see  on today's study but remains present in the high right parietal region. No new area of hemorrhage. Ventricle size normal.  No shift of midline structures. Sinusitis with air-fluid level sphenoid sinus similar to earlier today. IMPRESSION: Acute infarct posterior limb internal capsule Small area of subarachnoid hemorrhage right parietal area stable. No new area of hemorrhage. Electronically Signed   By: Franchot Gallo M.D.   On: 02/07/2016 18:44   Ct Head Wo Contrast  02/07/2016  CLINICAL DATA:  Follow-up exam for acute stroke, status post catheter directed revascularization. EXAM: CT HEAD WITHOUT CONTRAST TECHNIQUE: Contiguous axial images were obtained from the base of the skull through the vertex without intravenous contrast. COMPARISON:  Prior CT from 02/06/2016 as well as multiple previous studies. FINDINGS: Previously identified small volume acute subarachnoid hemorrhage within the right parieto-occipital region is slightly less conspicuous on today's study. Additionally, known subarachnoid hemorrhage/ debris within the left occipital region not well seen. No new intracranial hemorrhage. No definite evolving large vessel territory infarct identified. No mass lesion or midline shift. No mass effect. No hydrocephalus. No extra-axial fluid collection. Scalp soft tissues within normal limits. No acute abnormality about the orbits. Scattered fluid levels with opacity within the sphenoid ethmoidal sinuses. Patient appears to be intubated. No mastoid effusion. Calvarium intact. IMPRESSION: 1. Interval decrease in conspicuity of small volume right parietal and left parieto-occipital hemorrhage. No new intracranial hemorrhage identified. 2. No definite acute or evolving infarct identified by CT. Electronically Signed   By: Jeannine Boga M.D.   On: 02/07/2016 03:20   Ct Head Wo Contrast  02/06/2016  CLINICAL DATA:  She stroke. Left facial droop, left hemiplegia, slurred speech, left neglect, and right gaze.  Status post cerebral angiography and revascularization of occluded right ICA terminus and right MCA. EXAM: CT HEAD WITHOUT CONTRAST TECHNIQUE: Contiguous axial images were obtained from the base of the skull through the vertex without intravenous contrast. COMPARISON:  Head CT 1743 hours today FINDINGS: Small volume right parietal subarachnoid hemorrhage is unchanged, as is small volume residual left parieto-occipital subarachnoid hemorrhage. No definite edema suggestive of acute infarct is identified. No mass, midline shift, or extra-axial fluid collection is seen. Orbits are unremarkable. An endotracheal tube is partially visualized, with secretions noted in the posterior nasal cavity and pharynx. Mild mucosal thickening/small volume secretions are also noted in the right sphenoid sinus and right frontal sinus. Mastoid air cells are clear. IMPRESSION: 1. Unchanged small volume right parietal and left parieto-occipital subarachnoid hemorrhage. 2. No acute infarct apparent by CT. Electronically Signed   By: Logan Bores M.D.   On: 02/06/2016 21:59   Ct Head Wo Contrast  02/06/2016  CLINICAL DATA:  Stroke.  Left-sided weakness.  Endocarditis. EXAM: CT HEAD WITHOUT CONTRAST TECHNIQUE: Contiguous axial images were obtained from the base of the skull through the vertex without intravenous contrast. COMPARISON:  Head CT 02/06/2016 at 1400 hours. Head MRI and CT 02/01/2016. FINDINGS: There is a subcentimeter  focus of hyperattenuation associated with a right parietal sulcus (series 2, image 27) which may reflect trace subarachnoid hemorrhage/ proteinaceous debris. This is unchanged from today's earlier CT but is new from 02/01/2016. Left parietal subarachnoid hemorrhage/debris on the 02/01/2016 CT is much less conspicuous now. There is no evidence of acute large territory infarct, mass, midline shift, or extra-axial fluid collection. The punctate acute infarcts on the recent prior MRI are not well seen by CT. There is  mild generalized cerebral atrophy. Orbits are unremarkable. Minimal paranasal sinus mucosal thickening is noted. The mastoid air cells are clear. No acute osseous abnormality is identified. Calcified atherosclerosis is noted at the skull base. IMPRESSION: 1. Decreased conspicuity of left parieto-occipital subarachnoid hemorrhage/debris compared to the studies from 02/01/2016. 2. Small focus of subarachnoid hemorrhage/debris in a right parietal sulcus, unchanged from today's earlier study but new from 02/01/2016. This may reflect a new focus of hemorrhage or redistribution of the pre-existing blood products. Electronically Signed   By: Logan Bores M.D.   On: 02/06/2016 18:26   Ct Head Wo Contrast  02/06/2016  CLINICAL DATA:  Slip and fall today with slurred speech and hypoxia EXAM: CT HEAD WITHOUT CONTRAST TECHNIQUE: Contiguous axial images were obtained from the base of the skull through the vertex without intravenous contrast. COMPARISON:  02/01/2016 FINDINGS: Bony calvarium is intact. No findings to suggest acute hemorrhage, acute infarction or space-occupying mass lesion is seen. Some gyral swelling is noted similar to that seen on the prior exam in the left posterior parietal region. This is stable from the prior exam. No acute abnormality is noted. IMPRESSION: No significant change from the prior exam. No acute hemorrhage is noted. Electronically Signed   By: Inez Catalina M.D.   On: 02/06/2016 14:09   Ct Abdomen W Contrast  02/05/2016  CLINICAL DATA:  60 year old female inpatient with epigastric/left abdominal pain. Splenic infarction on recent CT. EXAM: CT ABDOMEN WITH CONTRAST TECHNIQUE: Multidetector CT imaging of the abdomen was performed using the standard protocol following bolus administration of intravenous contrast. CONTRAST:  100 cc Omnipaque 300 IV. COMPARISON:  01/14/2016 CT abdomen/ pelvis. FINDINGS: Lower chest: No significant pulmonary nodules or acute consolidative airspace disease. There  is stable mosaic attenuation at the lung bases. Stable trace layering right pleural effusion. Hepatobiliary: Diffuse hepatic steatosis. No liver mass. No definite liver surface irregularity. Normal gallbladder with no radiopaque cholelithiasis. No biliary ductal dilatation. Pancreas: Normal, with no mass or duct dilation. Spleen: There is new mild splenomegaly (craniocaudal splenic length 14.9 cm, increased from 12.6 cm on 02/29/2017). There is been continued evolution of the previously described splenic infarct involving the upper 50% of the spleen, which is now all fluid density and distended. There is a new large splenic infarct in the lower 50% of the spleen. Only approximately 25% of the splenic parenchyma remains perfused. Splenic artery and celiac trunk appear patent. Adrenals/Urinary Tract: Normal adrenals. Simple 2.1 cm anterior upper and 1.5 cm lower left renal cysts. No hydronephrosis. No new renal lesions. Stomach/Bowel: Grossly normal stomach. Visualized small and large bowel is normal caliber, with no bowel wall thickening. Vascular/Lymphatic: Atherosclerotic nonaneurysmal abdominal aorta. Patent portal, splenic, hepatic and renal veins. No pathologically enlarged lymph nodes in the abdomen. Other: No pneumoperitoneum, ascites or focal fluid collection. Musculoskeletal: Re- demonstrated are endplate erosions at 579FGE (series 6/ image 125), not appreciably changed since 01/14/2016, although new since 06/18/2012. Moderate degenerative changes in the visualized thoracolumbar spine. IMPRESSION: 1. New large splenic infarct in the  lower 50% of the spleen, new since 01/14/2016. Evolution of the previously described splenic infarct involving the upper 50% of the spleen. Spleen is now mildly enlarged. Cardiac embolic source remains the most likely etiology. Splenic artery and vein appear patent. 2. Stable endplate erosions at 579FGE, new since 2013, nonspecific. Consider further evaluation with thoracic spine  MRI. 3. Diffuse hepatic steatosis. 4. Mosaic attenuation at the lung bases, favor mosaic perfusion due to chronic pulmonary arterial hypertension. Electronically Signed   By: Ilona Sorrel M.D.   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 EXAM: MRI HEAD WITHOUT CONTRAST MRA HEAD WITHOUT CONTRAST TECHNIQUE: Multiplanar, multiecho pulse sequences of the brain and surrounding structures were obtained without intravenous contrast. Angiographic images of the head were obtained using MRA technique without contrast. COMPARISON:  Prior studies from 02/06/2016 and 02/07/2015. FINDINGS: MRI HEAD FINDINGS Diffusion-weighted imaging demonstrates multi focal areas of patchy restricted diffusion involving the right MCA distribution. Specifically, there are patchy cortical and subcortical infarcts within the right frontal lobe, right parietal lobe, and right temporoparietal region. 17 mm infarct present within the posterior limb of the right internal capsule extending into the mesial right temporal lobe and hippocampus. Patchy infarct within the right centrum semi ovale. These are new relative to most recent MRI. There is patchy associated petechial hemorrhage within a few areas of infarction as seen on SWI sequence (series 10, image 87). Probable small amount of acute subarachnoid hemorrhage within the right parietal lobe (series 10, image 86). This is stable relative to most recent CTs, but new relative to most recent MRI. Additional more faint diffusion abnormality within the subcortical left parietal lobe, consistent with more subacute infarct, evolved from previous MRI (series 4, image 25).  There appears to be a new small 6 mm cortical infarct within the left parietal lobe within this region (series 4, image 24). Previously noted subarachnoid hemorrhage within the left parieto-occipital region again seen, more prominent on today's study (series 10, image 74). While this may be related to differences in technique, possible small amount of new hemorrhage within this region not entirely excluded. Probable subacute small left cerebellar infarct also noted (series 4, image 8), not seen on previous MRI. No other infarct. Major intracranial vascular flow voids maintained. No mass lesion, midline shift, or significant mass effect. No hydrocephalus. No extra-axial fluid collection. Major dural sinuses are grossly patent. Craniocervical junction within normal limits. Pituitary gland normal. No acute abnormality about the orbits. Scattered mucosal thickening within the ethmoidal air cells and maxillary sinuses. Mucosal thickening with fluid levels present within the sphenoid sinuses. Patient appears to be intubated. Scattered opacity within the mastoid air cells bilaterally, right slightly worse than left. Inner ear structures grossly normal. Bone marrow signal intensity within normal limits. No acute scalp soft tissue abnormality. MRA HEAD FINDINGS ANTERIOR CIRCULATION: Study degraded by motion artifact. Visualized distal cervical segments of the internal carotid arteries are patent with antegrade flow. Petrous, cavernous, and supraclinoid segments widely patent. A1 segments, anterior communicating artery, and anterior cerebral arteries well opacified. M1 segments patent without stenosis or occlusion. Left MCA bifurcation normal. There is irregularity with ill definition of the right MCA bifurcation. Question of a focal outpouching measuring approximately 6 mm. Unclear  whether this finding is related to motion artifact with normal branch vessels, represents a possible new aneurysm (potentially mycotic in nature  given the history of endocarditis). Aneurysm might be potentially less favored as this an abnormality appears fairly linear in AP dimension, as would not be expected with aneurysm. Possible residual or new thrombus could also be considered. The right MCA branches are opacified distally. POSTERIOR CIRCULATION: Vertebral arteries patent to the vertebrobasilar junction. Left vertebral artery dominant. Posterior inferior cerebral arteries patent bilaterally. Basilar artery widely patent. Superior cerebellar arteries patent bilaterally. Both posterior cerebral arteries arise from the basilar artery and are well opacified to their distal aspects. Mild intracranial atheromatous disease better evaluated on recent CTA. IMPRESSION: MRI HEAD IMPRESSION: 1. Patchy multi focal acute right MCA territory infarcts involving the right frontal, parietal, and temporal lobes as above, with involvement of the posterior limb of the right internal capsule. Scattered petechial hemorrhage about a few of these infarcts, with probable small volume subarachnoid hemorrhage within the right parietal lobe, similar relative prior CTs, but new relative to most recent MRI. 2. Probable new subcentimeter cortical infarct within the left parietal lobe, with interval evolution of previously identified punctate subcortical left parietal infarcts. 3. Small subacute appearing infarct within the left cerebellar hemisphere, new relative to most recent MRI from 02/01/2016. 4. Slight interval increase in prominence of subarachnoid hemorrhage within the left parieto-occipital region. While this finding may be related to technique, the possibility of small amount of re- hemorrhage within this region should be considered. Attention at follow-up CTs recommended. MRA HEAD IMPRESSION: 1. No large or proximal arterial branch occlusion within the intracranial circulation. 2. Question new 6 mm abnormality at the level of the right MCA bifurcation. Unclear whether this  reflects motion artifact in relation to normal branch vessels, small amount of residual or new thrombus, and less likely new aneurysm (although this is not entirely excluded, with mycotic aneurysm certainly a possibility given the history of endocarditis). Further evaluation with dedicated CTA recommended. 3. No other new abnormality within the intracranial circulation. Electronically Signed   By: Jeannine Boga M.D.   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 EXAM: MRI HEAD WITHOUT CONTRAST MRA HEAD WITHOUT CONTRAST TECHNIQUE: Multiplanar, multiecho pulse sequences of the brain and surrounding structures were obtained without intravenous contrast. Angiographic images of the head were obtained using MRA technique without contrast. COMPARISON:  Prior studies from 02/06/2016 and 02/07/2015. FINDINGS: MRI HEAD FINDINGS Diffusion-weighted imaging demonstrates multi focal areas of patchy restricted diffusion involving the right MCA distribution. Specifically, there are patchy cortical and subcortical infarcts within the right frontal lobe, right parietal lobe, and right temporoparietal region. 17 mm infarct present within the posterior limb of the right internal capsule extending into the mesial right temporal lobe and hippocampus. Patchy infarct within the right centrum semi ovale. These are new relative to most recent MRI. There is patchy associated petechial hemorrhage within a few areas of infarction as seen on SWI sequence (series 10, image 87). Probable small amount of acute subarachnoid hemorrhage within the right parietal lobe (series 10, image 86). This is stable relative to  most recent CTs, but new relative to most recent MRI. Additional more faint diffusion abnormality within the subcortical  left parietal lobe, consistent with more subacute infarct, evolved from previous MRI (series 4, image 25). There appears to be a new small 6 mm cortical infarct within the left parietal lobe within this region (series 4, image 24). Previously noted subarachnoid hemorrhage within the left parieto-occipital region again seen, more prominent on today's study (series 10, image 74). While this may be related to differences in technique, possible small amount of new hemorrhage within this region not entirely excluded. Probable subacute small left cerebellar infarct also noted (series 4, image 8), not seen on previous MRI. No other infarct. Major intracranial vascular flow voids maintained. No mass lesion, midline shift, or significant mass effect. No hydrocephalus. No extra-axial fluid collection. Major dural sinuses are grossly patent. Craniocervical junction within normal limits. Pituitary gland normal. No acute abnormality about the orbits. Scattered mucosal thickening within the ethmoidal air cells and maxillary sinuses. Mucosal thickening with fluid levels present within the sphenoid sinuses. Patient appears to be intubated. Scattered opacity within the mastoid air cells bilaterally, right slightly worse than left. Inner ear structures grossly normal. Bone marrow signal intensity within normal limits. No acute scalp soft tissue abnormality. MRA HEAD FINDINGS ANTERIOR CIRCULATION: Study degraded by motion artifact. Visualized distal cervical segments of the internal carotid arteries are patent with antegrade flow. Petrous, cavernous, and supraclinoid segments widely patent. A1 segments, anterior communicating artery, and anterior cerebral arteries well opacified. M1 segments patent without stenosis or occlusion. Left MCA bifurcation normal. There is irregularity with ill definition of the right MCA  bifurcation. Question of a focal outpouching measuring approximately 6 mm. Unclear whether this finding is related to motion artifact with normal branch vessels, represents a possible new aneurysm (potentially mycotic in nature given the history of endocarditis). Aneurysm might be potentially less favored as this an abnormality appears fairly linear in AP dimension, as would not be expected with aneurysm. Possible residual or new thrombus could also be considered. The right MCA branches are opacified distally. POSTERIOR CIRCULATION: Vertebral arteries patent to the vertebrobasilar junction. Left vertebral artery dominant. Posterior inferior cerebral arteries patent bilaterally. Basilar artery widely patent. Superior cerebellar arteries patent bilaterally. Both posterior cerebral arteries arise from the basilar artery and are well opacified to their distal aspects. Mild intracranial atheromatous disease better evaluated on recent CTA. IMPRESSION: MRI HEAD IMPRESSION: 1. Patchy multi focal acute right MCA territory infarcts involving the right frontal, parietal, and temporal lobes as above, with involvement of the posterior limb of the right internal capsule. Scattered petechial hemorrhage about a few of these infarcts, with probable small volume subarachnoid hemorrhage within the right parietal lobe, similar relative prior CTs, but new relative to most recent MRI. 2. Probable new subcentimeter cortical infarct within the left parietal lobe, with interval evolution of previously identified punctate subcortical left parietal infarcts. 3. Small subacute appearing infarct within the left cerebellar hemisphere, new relative to most recent MRI from 02/01/2016. 4. Slight interval increase in prominence of subarachnoid hemorrhage within the left parieto-occipital region. While this finding may be related to technique, the possibility of small amount of re- hemorrhage within this region should be considered. Attention at  follow-up CTs recommended. MRA HEAD IMPRESSION: 1. No large or proximal arterial branch occlusion within the intracranial circulation. 2. Question new 6 mm abnormality at the level of the right MCA bifurcation. Unclear whether this reflects motion artifact in relation to normal branch vessels, small amount of residual or new thrombus, and less likely new aneurysm (although this is not entirely excluded, with mycotic  aneurysm certainly a possibility given the history of endocarditis). Further evaluation with dedicated CTA recommended. 3. No other new abnormality within the intracranial circulation. Electronically Signed   By: Jeannine Boga M.D.   On: 02/08/2016 01:45   Dg Chest Port 1 View  02/08/2016  CLINICAL DATA:  Acute respiratory failure EXAM: PORTABLE CHEST 1 VIEW COMPARISON:  February 06, 2016 FINDINGS: The ET tube is in good position. No pneumothorax. No focal infiltrate. Stable cardiomediastinal silhouette. There is a small left effusion with associated atelectasis. IMPRESSION: The ETT is in good position. A tiny left effusion with atelectasis is seen. Mild pulmonary venous congestion, improved in the interval. Electronically Signed   By: Dorise Bullion III M.D   On: 02/08/2016 08:09   Portable Chest Xray  02/06/2016  CLINICAL DATA:  Acute respiratory failure. EXAM: PORTABLE CHEST 1 VIEW COMPARISON:  Earlier the same day FINDINGS: New endotracheal tube is in good position, tip between the clavicular heads and carina. Diffuse interstitial coarsening with small left effusion. Chronic cardiomegaly. IMPRESSION: 1. New endotracheal tube in good position. 2. CHF pattern with modest progression from earlier today. Small left effusion. Electronically Signed   By: Monte Fantasia M.D.   On: 02/06/2016 22:38   Dg Chest Port 1 View  02/06/2016  CLINICAL DATA:  Shortness of breath today. EXAM: PORTABLE CHEST 1 VIEW COMPARISON:  02/01/2016 FINDINGS: The heart is enlarged but stable. There is tortuosity,  ectasia and calcification of the thoracic aorta. There is vascular congestion and interstitial pulmonary edema. No definite pleural effusions. The bony thorax is intact. IMPRESSION: Mild CHF. Electronically Signed   By: Marijo Sanes M.D.   On: 02/06/2016 15:19    Labs:  CBC:  Recent Labs  02/03/16 1110 02/05/16 0322 02/07/16 0545 02/08/16 0338  WBC 15.5* 14.2* 24.5* 20.9*  HGB 12.3 12.9 10.4* 10.9*  HCT 41.1 41.5 34.9* 36.4  PLT 474* 398 437* 454*    COAGS:  Recent Labs  01/31/16 2232 02/07/16 0545  INR 2.16* 1.32  APTT 36 27    BMP:  Recent Labs  02/03/16 0032 02/05/16 0322 02/07/16 0545 02/08/16 0338  NA 134* 134* 136 139  K 4.8 4.3 4.3 3.9  CL 98* 99* 101 98*  CO2 26 25 27 28   GLUCOSE 198* 204* 180* 153*  BUN 20 10 6 7   CALCIUM 11.0* 10.2 10.0 10.4*  CREATININE 0.92 0.91 0.84 0.88  GFRNONAA >60 >60 >60 >60  GFRAA >60 >60 >60 >60    LIVER FUNCTION TESTS:  Recent Labs  01/15/16 0543 01/31/16 2232 02/05/16 0322 02/07/16 0545  BILITOT 0.6 0.5 0.7 0.2*  AST 16 18 15  11*  ALT 15 12* 14 10*  ALKPHOS 78 75 76 67  PROT 6.5 7.2 6.9 5.9*  ALBUMIN 2.7* 3.1* 2.8* 2.3*    Assessment and Plan:  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

## 2016-02-08 NOTE — Procedures (Signed)
Extubation Procedure Note  Patient Details:   Name: Margaret [name] DOB: 1956/03/31 MRN: [id number]   Airway Documentation:  Airway 7.5 mm (Active)  Secured at (cm) 21 cm 02/08/2016  7:46 AM  Measured From Lips 02/08/2016  7:46 AM  Secured Location Right 02/08/2016  7:46 AM  Secured By [employer] 02/08/2016  7:46 AM  Tube Holder Repositioned Yes 02/08/2016  7:46 AM  Cuff Pressure (cm H2O) 24 cm H2O 02/07/2016 11:26 AM  Site Condition Dry 02/08/2016  7:46 AM  Pt extubated to 2lpm [place] no distress noted at this time. Achieved 500cc's via incentive spirometer.   Evaluation  O2 sats: stable throughout Complications: No apparent complications Patient did tolerate procedure well. Bilateral Breath Sounds: Diminished Suctioning: Airway Yes  Zyler Hyson Wyatt Haste 02/08/2016, 10:08 AM

## 2016-02-08 NOTE — Progress Notes (Signed)
[facility]    Date of Admission:  02/01/2016   Total days of antibiotics 8        Day 3 ampicillin           ID: Margaret [name] is a 60 y.o. 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 Remains intubated, some movement to right arm and leg Principal Problem:   Bacterial endocarditis Active Problems:   HTN (hypertension)   COPD (chronic obstructive pulmonary disease) (HCC)   Chronic diastolic CHF (congestive heart failure) (HCC)   Morbid obesity (HCC)   Insulin dependent diabetes mellitus ([place])   Essential hypertension   Hepatic cirrhosis (HCC)   Splenic infarct   Pulmonary hypertension assoc with unclear multi-factorial mechanisms (HCC)   Subarachnoid hemorrhage (HCC)   Leukocytosis   Diarrhea   Hyponatremia   Subarachnoid bleed (HCC)   Stroke (HCC)   Visual changes   Bacteremia   Cerebrovascular accident (CVA) due to embolism of cerebral artery (HCC)   Streptococcus viridans infection   Epigastric abdominal tenderness on direct palpation   Positive ANA (antinuclear antibody)   Acute respiratory failure with hypoxemia (HCC)   Right carotid artery occlusion    Subjective: Remains afebrile, working with speech therapy.   RN reports seeing some movement to left leg, has spontaneous movement of right arm and leg.  Medications:  . albuterol  2.5 mg Nebulization QHS  . ampicillin (OMNIPEN) IV  2 g Intravenous 6 times per day  . antiseptic oral rinse  7 mL Mouth Rinse 10 times per day  . chlorhexidine gluconate  15 mL Mouth Rinse BID  . famotidine (PEPCID) IV  20 mg Intravenous Q12H  . furosemide  40 mg Intravenous BID  . insulin aspart  0-9 Units Subcutaneous TID WC  . pravastatin  20 mg Oral  q1800  . sodium chloride flush  3 mL Intravenous Q12H    Objective: Vital signs in last 24 hours: Temp:  [97.3 F (36.3 C)-98.7 F (37.1 C)] 98.7 F (37.1 C) (03/18 0746) Pulse Rate:  [71-88] 82 (03/18 1100) Resp:  [13-25] 21 (03/18 1100) BP: (103-154)/(47-106) 147/66 mmHg (03/18 1000) SpO2:  [95 %-100 %] 95 % (03/18 1100) Arterial Line BP: (105-128)/(58-74) 105/74 mmHg (03/17 1600) FiO2 (%):  [40 %] 40 % (03/18 0800) Physical Exam  Constitutional:  oriented to person, place, and time. appears well-developed and well-nourished. No distress.  HENT: [place]/AT, PERRLA, no scleral icterus Neurological: alert and oriented to person, slow to answering simple questions, some delay in following commands. Left sided neglect noted. Moves right arm and leg spontanesouly Skin: Skin is warm and dry. No rash noted. No erythema.    Lab Results  Recent Labs  02/07/16 0545 02/08/16 0338  WBC 24.5* 20.9*  HGB 10.4* 10.9*  HCT 34.9* 36.4  NA 136 139  K 4.3 3.9  CL 101 98*  CO2 27 28  BUN 6 7  CREATININE 0.84 0.88   Liver Panel  Recent Labs  02/07/16 0545  PROT 5.9*  ALBUMIN 2.3*  AST 11*  ALT 10*  ALKPHOS 67  BILITOT 0.2*    Microbiology: 3/14 blood cx ngtd x 2 3/11 blood cx viridans strep x 2  Studies/Results: Ct  Head Wo Contrast  02/07/2016  CLINICAL DATA:  Stroke. Post revascularization occluded right internal carotid artery terminus and proximal right MCA EXAM: CT HEAD WITHOUT CONTRAST TECHNIQUE: Contiguous axial images were obtained from the base of the skull through the vertex without intravenous contrast. COMPARISON:  CT head 02/07/2016 FINDINGS: Hypodensity right posterior limb internal capsule has become more apparent consistent with acute infarct. No other areas of acute infarct. Small volume subarachnoid hemorrhage difficult to see on today's study but remains present in the high right parietal region. No new area of hemorrhage. Ventricle size normal.  No shift of midline  structures. Sinusitis with air-fluid level sphenoid sinus similar to earlier today. IMPRESSION: Acute infarct posterior limb internal capsule Small area of subarachnoid hemorrhage right parietal area stable. No new area of hemorrhage. Electronically Signed   By: Franchot Gallo M.D.   On: 02/07/2016 18:44   Ct Head Wo Contrast  02/07/2016  CLINICAL DATA:  Follow-up exam for acute stroke, status post catheter directed revascularization. EXAM: CT HEAD WITHOUT CONTRAST TECHNIQUE: Contiguous axial images were obtained from the base of the skull through the vertex without intravenous contrast. COMPARISON:  Prior CT from 02/06/2016 as well as multiple previous studies. FINDINGS: Previously identified small volume acute subarachnoid hemorrhage within the right parieto-occipital region is slightly less conspicuous on today's study. Additionally, known subarachnoid hemorrhage/ debris within the left occipital region not well seen. No new intracranial hemorrhage. No definite evolving large vessel territory infarct identified. No mass lesion or midline shift. No mass effect. No hydrocephalus. No extra-axial fluid collection. Scalp soft tissues within normal limits. No acute abnormality about the orbits. Scattered fluid levels with opacity within the sphenoid ethmoidal sinuses. Patient appears to be intubated. No mastoid effusion. Calvarium intact. IMPRESSION: 1. Interval decrease in conspicuity of small volume right parietal and left parieto-occipital hemorrhage. No new intracranial hemorrhage identified. 2. No definite acute or evolving infarct identified by CT. Electronically Signed   By: Jeannine Boga M.D.   On: 02/07/2016 03:20   Ct Head Wo Contrast  02/06/2016  CLINICAL DATA:  She stroke. Left facial droop, left hemiplegia, slurred speech, left neglect, and right gaze. Status post cerebral angiography and revascularization of occluded right ICA terminus and right MCA. EXAM: CT HEAD WITHOUT CONTRAST TECHNIQUE:  Contiguous axial images were obtained from the base of the skull through the vertex without intravenous contrast. COMPARISON:  Head CT 1743 hours today FINDINGS: Small volume right parietal subarachnoid hemorrhage is unchanged, as is small volume residual left parieto-occipital subarachnoid hemorrhage. No definite edema suggestive of acute infarct is identified. No mass, midline shift, or extra-axial fluid collection is seen. Orbits are unremarkable. An endotracheal tube is partially visualized, with secretions noted in the posterior nasal cavity and pharynx. Mild mucosal thickening/small volume secretions are also noted in the right sphenoid sinus and right frontal sinus. Mastoid air cells are clear. IMPRESSION: 1. Unchanged small volume right parietal and left parieto-occipital subarachnoid hemorrhage. 2. No acute infarct apparent by CT. Electronically Signed   By: Logan Bores M.D.   On: 02/06/2016 21:59   Ct Head Wo Contrast  02/06/2016  CLINICAL DATA:  Stroke.  Left-sided weakness.  Endocarditis. EXAM: CT HEAD WITHOUT CONTRAST TECHNIQUE: Contiguous axial images were obtained from the base of the skull through the vertex without intravenous contrast. COMPARISON:  Head CT 02/06/2016 at 1400 hours. Head MRI and CT 02/01/2016. FINDINGS: There is a subcentimeter focus of hyperattenuation associated with a right parietal sulcus (series 2, image 27) which may reflect  trace subarachnoid hemorrhage/ proteinaceous debris. This is unchanged from today's earlier CT but is new from 02/01/2016. Left parietal subarachnoid hemorrhage/debris on the 02/01/2016 CT is much less conspicuous now. There is no evidence of acute large territory infarct, mass, midline shift, or extra-axial fluid collection. The punctate acute infarcts on the recent prior MRI are not well seen by CT. There is mild generalized cerebral atrophy. Orbits are unremarkable. Minimal paranasal sinus mucosal thickening is noted. The mastoid air cells are  clear. No acute osseous abnormality is identified. Calcified atherosclerosis is noted at the skull base. IMPRESSION: 1. Decreased conspicuity of left parieto-occipital subarachnoid hemorrhage/debris compared to the studies from 02/01/2016. 2. Small focus of subarachnoid hemorrhage/debris in a right parietal sulcus, unchanged from today's earlier study but new from 02/01/2016. This may reflect a new focus of hemorrhage or redistribution of the pre-existing blood products. Electronically Signed   By: Logan Bores M.D.   On: 02/06/2016 18:26   Ct Head Wo Contrast  02/06/2016  CLINICAL DATA:  Slip and fall today with slurred speech and hypoxia EXAM: CT HEAD WITHOUT CONTRAST TECHNIQUE: Contiguous axial images were obtained from the base of the skull through the vertex without intravenous contrast. COMPARISON:  02/01/2016 FINDINGS: Bony calvarium is intact. No findings to suggest acute hemorrhage, acute infarction or space-occupying mass lesion is seen. Some gyral swelling is noted similar to that seen on the prior exam in the left posterior parietal region. This is stable from the prior exam. No acute abnormality is noted. IMPRESSION: No significant change from the prior exam. No acute hemorrhage is noted. Electronically Signed   By: Inez Catalina M.D.   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 EXAM: MRI HEAD WITHOUT CONTRAST MRA HEAD WITHOUT CONTRAST TECHNIQUE: Multiplanar, multiecho pulse sequences of the brain and surrounding structures were obtained without intravenous contrast. Angiographic images of the head were obtained using MRA technique without contrast. COMPARISON:  Prior  studies from 02/06/2016 and 02/07/2015. FINDINGS: MRI HEAD FINDINGS Diffusion-weighted imaging demonstrates multi focal areas of patchy restricted diffusion involving the right MCA distribution. Specifically, there are patchy cortical and subcortical infarcts within the right frontal lobe, right parietal lobe, and right temporoparietal region. 17 mm infarct present within the posterior limb of the right internal capsule extending into the mesial right temporal lobe and hippocampus. Patchy infarct within the right centrum semi ovale. These are new relative to most recent MRI. There is patchy associated petechial hemorrhage within a few areas of infarction as seen on SWI sequence (series 10, image 87). Probable small amount of acute subarachnoid hemorrhage within the right parietal lobe (series 10, image 86). This is stable relative to most recent CTs, but new relative to most recent MRI. Additional more faint diffusion abnormality within the subcortical left parietal lobe, consistent with more subacute infarct, evolved from previous MRI (series 4, image 25). There appears to be a new small 6 mm cortical infarct within the left parietal lobe within this region (series 4, image 24). Previously noted subarachnoid hemorrhage within the left parieto-occipital region again seen, more prominent on today's study (series 10, image 74). While this may be related to differences in technique, possible small amount of new hemorrhage within this region not entirely  excluded. Probable subacute small left cerebellar infarct also noted (series 4, image 8), not seen on previous MRI. No other infarct. Major intracranial vascular flow voids maintained. No mass lesion, midline shift, or significant mass effect. No hydrocephalus. No extra-axial fluid collection. Major dural sinuses are grossly patent. Craniocervical junction within normal limits. Pituitary gland normal. No acute abnormality about the orbits. Scattered mucosal thickening  within the ethmoidal air cells and maxillary sinuses. Mucosal thickening with fluid levels present within the sphenoid sinuses. Patient appears to be intubated. Scattered opacity within the mastoid air cells bilaterally, right slightly worse than left. Inner ear structures grossly normal. Bone marrow signal intensity within normal limits. No acute scalp soft tissue abnormality. MRA HEAD FINDINGS ANTERIOR CIRCULATION: Study degraded by motion artifact. Visualized distal cervical segments of the internal carotid arteries are patent with antegrade flow. Petrous, cavernous, and supraclinoid segments widely patent. A1 segments, anterior communicating artery, and anterior cerebral arteries well opacified. M1 segments patent without stenosis or occlusion. Left MCA bifurcation normal. There is irregularity with ill definition of the right MCA bifurcation. Question of a focal outpouching measuring approximately 6 mm. Unclear whether this finding is related to motion artifact with normal branch vessels, represents a possible new aneurysm (potentially mycotic in nature given the history of endocarditis). Aneurysm might be potentially less favored as this an abnormality appears fairly linear in AP dimension, as would not be expected with aneurysm. Possible residual or new thrombus could also be considered. The right MCA branches are opacified distally. POSTERIOR CIRCULATION: Vertebral arteries patent to the vertebrobasilar junction. Left vertebral artery dominant. Posterior inferior cerebral arteries patent bilaterally. Basilar artery widely patent. Superior cerebellar arteries patent bilaterally. Both posterior cerebral arteries arise from the basilar artery and are well opacified to their distal aspects. Mild intracranial atheromatous disease better evaluated on recent CTA. IMPRESSION: MRI HEAD IMPRESSION: 1. Patchy multi focal acute right MCA territory infarcts involving the right frontal, parietal, and temporal lobes as  above, with involvement of the posterior limb of the right internal capsule. Scattered petechial hemorrhage about a few of these infarcts, with probable small volume subarachnoid hemorrhage within the right parietal lobe, similar relative prior CTs, but new relative to most recent MRI. 2. Probable new subcentimeter cortical infarct within the left parietal lobe, with interval evolution of previously identified punctate subcortical left parietal infarcts. 3. Small subacute appearing infarct within the left cerebellar hemisphere, new relative to most recent MRI from 02/01/2016. 4. Slight interval increase in prominence of subarachnoid hemorrhage within the left parieto-occipital region. While this finding may be related to technique, the possibility of small amount of re- hemorrhage within this region should be considered. Attention at follow-up CTs recommended. MRA HEAD IMPRESSION: 1. No large or proximal arterial branch occlusion within the intracranial circulation. 2. Question new 6 mm abnormality at the level of the right MCA bifurcation. Unclear whether this reflects motion artifact in relation to normal branch vessels, small amount of residual or new thrombus, and less likely new aneurysm (although this is not entirely excluded, with mycotic aneurysm certainly a possibility given the history of endocarditis). Further evaluation with dedicated CTA recommended. 3. No other new abnormality within the intracranial circulation. Electronically Signed   By: Jeannine Boga M.D.   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 Also status post recent acute right ICA occlusion (  Likely septic emboli in nature), status post catheter directed revascularization. EXAM: MRI HEAD WITHOUT CONTRAST MRA HEAD WITHOUT CONTRAST  TECHNIQUE: Multiplanar, multiecho pulse sequences of the brain and surrounding structures were obtained without intravenous contrast. Angiographic images of the head were obtained using MRA technique without contrast. COMPARISON:  Prior studies from 02/06/2016 and 02/07/2015. FINDINGS: MRI HEAD FINDINGS Diffusion-weighted imaging demonstrates multi focal areas of patchy restricted diffusion involving the right MCA distribution. Specifically, there are patchy cortical and subcortical infarcts within the right frontal lobe, right parietal lobe, and right temporoparietal region. 17 mm infarct present within the posterior limb of the right internal capsule extending into the mesial right temporal lobe and hippocampus. Patchy infarct within the right centrum semi ovale. These are new relative to most recent MRI. There is patchy associated petechial hemorrhage within a few areas of infarction as seen on SWI sequence (series 10, image 87). Probable small amount of acute subarachnoid hemorrhage within the right parietal lobe (series 10, image 86). This is stable relative to most recent CTs, but new relative to most recent MRI. Additional more faint diffusion abnormality within the subcortical left parietal lobe, consistent with more subacute infarct, evolved from previous MRI (series 4, image 25). There appears to be a new small 6 mm cortical infarct within the left parietal lobe within this region (series 4, image 24). Previously noted subarachnoid hemorrhage within the left parieto-occipital region again seen, more prominent on today's study (series 10, image 74). While this may be related to differences in technique, possible small amount of new hemorrhage within this region not entirely excluded. Probable subacute small left cerebellar infarct also noted (series 4, image 8), not seen on previous MRI. No other infarct. Major intracranial vascular flow voids maintained. No mass lesion, midline shift, or significant mass  effect. No hydrocephalus. No extra-axial fluid collection. Major dural sinuses are grossly patent. Craniocervical junction within normal limits. Pituitary gland normal. No acute abnormality about the orbits. Scattered mucosal thickening within the ethmoidal air cells and maxillary sinuses. Mucosal thickening with fluid levels present within the sphenoid sinuses. Patient appears to be intubated. Scattered opacity within the mastoid air cells bilaterally, right slightly worse than left. Inner ear structures grossly normal. Bone marrow signal intensity within normal limits. No acute scalp soft tissue abnormality. MRA HEAD FINDINGS ANTERIOR CIRCULATION: Study degraded by motion artifact. Visualized distal cervical segments of the internal carotid arteries are patent with antegrade flow. Petrous, cavernous, and supraclinoid segments widely patent. A1 segments, anterior communicating artery, and anterior cerebral arteries well opacified. M1 segments patent without stenosis or occlusion. Left MCA bifurcation normal. There is irregularity with ill definition of the right MCA bifurcation. Question of a focal outpouching measuring approximately 6 mm. Unclear whether this finding is related to motion artifact with normal branch vessels, represents a possible new aneurysm (potentially mycotic in nature given the history of endocarditis). Aneurysm might be potentially less favored as this an abnormality appears fairly linear in AP dimension, as would not be expected with aneurysm. Possible residual or new thrombus could also be considered. The right MCA branches are opacified distally. POSTERIOR CIRCULATION: Vertebral arteries patent to the vertebrobasilar junction. Left vertebral artery dominant. Posterior inferior cerebral arteries patent bilaterally. Basilar artery widely patent. Superior cerebellar arteries patent bilaterally. Both posterior cerebral arteries arise from the basilar artery and are well opacified to their  distal aspects. Mild intracranial atheromatous disease better evaluated on recent CTA. IMPRESSION: MRI HEAD IMPRESSION: 1. Patchy multi focal acute right MCA territory infarcts involving  the right frontal, parietal, and temporal lobes as above, with involvement of the posterior limb of the right internal capsule. Scattered petechial hemorrhage about a few of these infarcts, with probable small volume subarachnoid hemorrhage within the right parietal lobe, similar relative prior CTs, but new relative to most recent MRI. 2. Probable new subcentimeter cortical infarct within the left parietal lobe, with interval evolution of previously identified punctate subcortical left parietal infarcts. 3. Small subacute appearing infarct within the left cerebellar hemisphere, new relative to most recent MRI from 02/01/2016. 4. Slight interval increase in prominence of subarachnoid hemorrhage within the left parieto-occipital region. While this finding may be related to technique, the possibility of small amount of re- hemorrhage within this region should be considered. Attention at follow-up CTs recommended. MRA HEAD IMPRESSION: 1. No large or proximal arterial branch occlusion within the intracranial circulation. 2. Question new 6 mm abnormality at the level of the right MCA bifurcation. Unclear whether this reflects motion artifact in relation to normal branch vessels, small amount of residual or new thrombus, and less likely new aneurysm (although this is not entirely excluded, with mycotic aneurysm certainly a possibility given the history of endocarditis). Further evaluation with dedicated CTA recommended. 3. No other new abnormality within the intracranial circulation. Electronically Signed   By: Jeannine Boga M.D.   On: 02/08/2016 01:45   Dg Chest Port 1 View  02/08/2016  CLINICAL DATA:  Acute respiratory failure EXAM: PORTABLE CHEST 1 VIEW COMPARISON:  February 06, 2016 FINDINGS: The ET tube is in good position. No  pneumothorax. No focal infiltrate. Stable cardiomediastinal silhouette. There is a small left effusion with associated atelectasis. IMPRESSION: The ETT is in good position. A tiny left effusion with atelectasis is seen. Mild pulmonary venous congestion, improved in the interval. Electronically Signed   By: Dorise Bullion III M.D   On: 02/08/2016 08:09   Portable Chest Xray  02/06/2016  CLINICAL DATA:  Acute respiratory failure. EXAM: PORTABLE CHEST 1 VIEW COMPARISON:  Earlier the same day FINDINGS: New endotracheal tube is in good position, tip between the clavicular heads and carina. Diffuse interstitial coarsening with small left effusion. Chronic cardiomegaly. IMPRESSION: 1. New endotracheal tube in good position. 2. CHF pattern with modest progression from earlier today. Small left effusion. Electronically Signed   By: Monte Fantasia M.D.   On: 02/06/2016 22:38   Dg Chest Port 1 View  02/06/2016  CLINICAL DATA:  Shortness of breath today. EXAM: PORTABLE CHEST 1 VIEW COMPARISON:  02/01/2016 FINDINGS: The heart is enlarged but stable. There is tortuosity, ectasia and calcification of the thoracic aorta. There is vascular congestion and interstitial pulmonary edema. No definite pleural effusions. The bony thorax is intact. IMPRESSION: Mild CHF. Electronically Signed   By: Marijo Sanes M.D.   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 SHe is also being seen by CT surgery but decision to do valve replacement is likely impacted by most recent events of CNS embolic stroke. Defer to their assessment for need of surgical vs. Medical management. For now plan on 6 wk of ampicillin using 3/14 as day 1 of 42 days.   If she has fever or worsening WBC, would recommend repeat blood cx  CNS embolic stroke with neurologic impairment = continue  with speech/OT/PT assessment and therapy.  Acute respiratory failure on vent =  decannulated and supported by nasal cannula  Leukocytosis = appears improving, down from 24 to 20 today.   Baxter Flattery [facility] Diseases Cell: [phone] Pager: [phone]  02/08/2016, 11:55 AM

## 2016-02-08 NOTE — Progress Notes (Signed)
STROKE TEAM PROGRESS NOTE  HISTORY AT TIME OF ADMISSION Margaret [name] is a 60 y.o. female who presents with headache/vision changes that started today. She had headaches starting Thursday which worsened over the course of the day. Today, she had sudden onset of vision loss with headache and lightheadedness. She has also had some nausea and vomiting.  Ct Head Wo Contrast - localized subarachnoid hemorrhage/debris in the left parietal occipital region.  Of note, she was just discharged from the hospital after an acute splenic infarction without clear etiology or source of embolus. She was on aspirin and Xarelto, aspirin was discontinued at her March 2 clinic visit. At that time, Dr. Alvy Bimler felt that it was most likely related to splenic congestion.   Here, she has low-grade fevers.  LKW: Thursday, 03/09 tpa given?: no, out of window, anticoagulated.    SUBJECTIVE (INTERVAL HISTORY) Pt still intubated and sedated. Off sedation, pt is able to open eyes on voice, spontaneously moving right UE and RLE, but LUE flaccid and minimal movement LLE. MRI not done yet. She is following commands today, can give me a thumbs up on the right. Weaning today with hopeful extubation.    OBJECTIVE Temp:  [97.3 F (36.3 C)-98.7 F (37.1 C)] 98.7 F (37.1 C) (03/18 0746) Pulse Rate:  [71-88] 82 (03/18 1100) Cardiac Rhythm:  [-] Normal sinus rhythm (03/18 0800) Resp:  [13-25] 21 (03/18 1100) BP: (103-154)/(47-106) 147/66 mmHg (03/18 1000) SpO2:  [95 %-100 %] 95 % (03/18 1100) Arterial Line BP: (105-128)/(58-74) 105/74 mmHg (03/17 1600) FiO2 (%):  [40 %] 40 % (03/18 0800)  CBC:   Recent Labs Lab 02/05/16 0322 02/07/16 0545 02/08/16 0338  WBC 14.2* 24.5* 20.9*  NEUTROABS 10.9*  --   --   HGB 12.9 10.4* 10.9*  HCT 41.5 34.9* 36.4  MCV 88.3 89.3 89.9  PLT 398 437* 454*    Basic Metabolic Panel:   Recent Labs Lab 02/07/16 0545 02/08/16 0338  NA 136 139  K 4.3 3.9  CL 101 98*  CO2 27  28  GLUCOSE 180* 153*  BUN 6 7  CREATININE 0.84 0.88  CALCIUM 10.0 10.4*    Lipid Panel:     Component Value Date/Time   CHOL 155 02/02/2016 1510   TRIG 137 02/02/2016 1510   HDL 21* 02/02/2016 1510   CHOLHDL 7.4 02/02/2016 1510   VLDL 27 02/02/2016 1510   LDLCALC 107* 02/02/2016 1510   HgbA1c:  Lab Results  Component Value Date   HGBA1C 8.4* 02/02/2016   Urine Drug Screen: No results found for: LABOPIA, COCAINSCRNUR, LABBENZ, AMPHETMU, THCU, LABBARB    IMAGING I have personally reviewed the radiological images below and agree with the radiology interpretations.  Dg Chest 2 View 02/01/2016    Cardiomegaly and pulmonary venous congestion.   Ct Head Wo Contrast 02/01/2016   Localized subarachnoid hemorrhage/debris in the left parietal occipital region as discussed above. MRI/MRA recommended.   Mri and Mra Head Wo Contrast 02/01/2016   1. Small volume subarachnoid hemorrhage/debris within the left parieto-occipital region, corresponding to abnormality seen on prior CT. Minimal gyral swelling within this region without significant mass effect or other abnormality.  2. Few sub cm cortical/subcortical ischemic infarcts within the bilateral parietal lobes as above.  3. Negative intracranial MRA.   CTA of head and neck 02/01/2016 1. Mild intracranial and extracranial atherosclerosis without significant stenosis. 2. Patent dural venous sinuses. No vascular malformation identified.  LE venous doppler - Bilateral: No evidence of DVT, superficial  thrombosis, or Baker's Cyst.  EEG - Unremarkable awake and drowsy routine inpatient EEG. Clinical correlation is recommended .   TEE - Normal LV size with EF 60-65%. Normal wall motion. Normal RV size and systolic function. Trivial TR, no TV vegetation. No PV vegetation. Trileaflet aortic valve with no stenosis or regurgitation, no vegetation. There was a mobile vegetation on the posterior leaflet of the mitral valve measuring  about 2 cm in greatest dimension. The posterior and anterior leaflets of the MV did not coapt properly and there was very eccentric, anteriorly-directed mitral regurgitation. I suspect severe MR. Jet was too eccentric for PISA calculations. There was systolic flattening but not flow reversal in the pulmonary vein doppler pattern. The left atrium was mildly dilated, no LA appendage thrombus. Normal right atrium. No evidence for PFO or ASD. Normal caliber aorta with mild plaque in the descending thoracic aorta.  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  Ct Head Wo Contrast 02/07/2016  IMPRESSION: Acute infarct posterior limb internal capsule Small area of subarachnoid hemorrhage right parietal area stable. No new area of hemorrhage. Electronically Signed   By: Franchot Gallo M.D.   On: 02/07/2016 18:44   MRI and MRA  02/07/2016  MRI HEAD 1. Patchy multi focal acute right MCA territory infarcts involving the right frontal, parietal, and temporal lobes as above, with involvement of the posterior limb of the right internal capsule. Scattered petechial hemorrhage about a few of these infarcts, with probable small volume subarachnoid hemorrhage within the right parietal lobe, similar relative prior CTs, but new relative to most recent MRI. 2. Probable new subcentimeter cortical infarct within the left parietal lobe, with interval evolution of previously identified punctate subcortical left parietal infarcts. 3. Small subacute appearing infarct within the left cerebellar hemisphere, new relative to most recent MRI from 02/01/2016. 4. Slight interval increase in prominence of subarachnoid hemorrhage within the left parieto-occipital region. While this finding may be related to technique, the possibility of small amount of re- hemorrhage within this region should be considered. Attention at follow-up CTs recommended.  MRA 1.  No large or proximal arterial branch occlusion within the intracranial circulation. 2. Question new 6 mm abnormality at the level of the right MCA bifurcation. Unclear whether this reflects motion artifact in relation to normal branch vessels, small amount of residual or new thrombus, and less likely new aneurysm (although this is not entirely excluded, with mycotic aneurysm certainly a possibility given the history of endocarditis). Further evaluation with dedicated CTA recommended. 3. No other new abnormality within the intracranial circulation.    PHYSICAL EXAM  Temp:  [97.3 F (36.3 C)-98.7 F (37.1 C)] 98.7 F (37.1 C) (03/18 0746) Pulse Rate:  [71-88] 82 (03/18 1100) Resp:  [13-25] 21 (03/18 1100) BP: (103-154)/(47-106) 147/66 mmHg (03/18 1000) SpO2:  [95 %-100 %] 95 % (03/18 1100) Arterial Line BP: (105-128)/(58-74) 105/74 mmHg (03/17 1600) FiO2 (%):  [40 %] 40 % (03/18 0800)  General - morbid obesity, well developed, intubated and sedated.  Ophthalmologic - Fundi not visualized due to noncooperation  Cardiovascular - Regular rate and rhythm.  Neuro - intubated and sedated. Briefly off sedation, eyes open to voice, can follow simple command like thumbs up on the right. Eyes mid position, right gaze preference and does not cross the midline. No blink to threat on the left, + blink to threat on the right. PERRL,  positive corneal and gag, positive doll's eyes. RUE and RLE spontaneously movement, with  pain stimulation able to against gravity. LUE flaccid and LLE withdraw to mild stim. DTR 1+, left upgoing. Moving left leg to mild stim.Left arm flaccid and no w/d to nox stim.    ASSESSMENT/PLAN Ms. CHRISTIONA KRAM is a 60 y.o. female with history of hypertension, hyperlipidemia, COPD, asthma, diabetes mellitus, migraines, panic attacks, and morbid obesity, presenting with mild headache, visual changes, low-grade fevers, and nausea. She did not receive IV t-PA due to late  presentation and anticoagulation.  Acute stroke due to right ICA acute occlusion s/p endovascular thrombectomy - likely septic emboli  Acute neuro changes 02/06/16 with left hemiplegia, right gaze and left neglect  S/p emergency thrombectomy with TICI 3 recannulization  Intubated and sedated  CT repeat showed only right BG infarct  MRI - Patchy multi focal acute right MCA territory infarcts involving the right frontal, parietal, and temporal lobes   MRA - Question new 6 mm abnormality at the level of the right MCA bifurcation. Dedicated CTA recommended.  No antiplatelet or anticoagulation needed due to endocarditis  Small SAH within the left parieto-occipital region, and 4 punctate infarcts at the watershed area including left MCA/PCA, bilateral MCA/ACA, likely due to endocarditis. No mycotic aneurysm found on CTA. CTV has ruled out CVT.   Resultant asymptomatic  MRI - left parieto-occipital small SAH, punctate infarcts at the watershed area (left MCA/PCA, b/l MCA/ACA)  MRA - negative  CTA head and neck - no cerebral venous thrombosis, AVM, or aneurysm.  2D Echo - EF 60-65% in 12/2015  LE venous doppler - negative for DVT  EEG - negative   TEE - MV endocarditis and MV regurgitation  LDL - 107  HgbA1c 8.4  Hypercoagulable work up positive lupus anticoagulant likely due to Xarelto use PTA. However, ANA and RF positive, need repeat as outpt  VTE prophylaxis - SCDs Diet NPO time specified  Xarelto (rivaroxaban) daily prior to admission, now on No antithrombotic secondary to [facility]. Due to endocarditis, no antiplatelet or anticoagulation indicated at this time.  Patient counseled to be compliant with her antithrombotic medications  Ongoing aggressive stroke risk factor management  MV endocarditis with septic emboli - explains spleen infarct, SAH and punctate embolic strokes  TEE showed MV endocarditis and MV regurgitation  CTA head and neck showed no mycotic  aneurysm  Blood culture 2/2 strep vividan positive  ID on board   Elevated WBC  On Rocephin  Repeat BCx NGTD  CTS consult pending to evaluate if surgical intervention needed.  Hypertension  Stable mildly low blood pressures  Avoid hypotension  Hyperlipidemia  Home meds:  Lovastatin 20 mg daily resumed in hospital  LDL 107, goal < 70  On pravastatin 20mg  now  Continue statin at discharge  Diabetes  HgbA1c 8.4, goal < 7.0  Uncontrolled  On lantus  SSI  Other Stroke Risk Factors  Advanced age  Cigarette smoker, quit smoking 3 years ago.  Morbid obesity, Body mass index is 45.2 kg/(m^2).   Hx stroke/TIA  Family hx stroke (father)  Migraines  PVD - aortic athrosclerosis  Other Active Problems  Leukocytosis - 14.2 -> 24.5 -> 20.9 (Saturday)  Elevated creatinine 1.10  Mild hyponatremia  Pulmonary HTN  Hilar lymphoadenopathy  Positive ANA and RF, need to repeat as outpt. Rheumatology referral if needed.   Hospital day # 7   This patient is critically ill due to acute ICA occlusion, infarct s/p thrombectomy, endocarditis and at significant risk of neurological worsening, death form recurrent infarct, vessel occlusion, hemorrhagic  infarct, SAH or ICH, sepsis, seizure. This patient's care requires constant monitoring of vital signs, hemodynamics, respiratory and cardiac monitoring, review of multiple databases, neurological assessment, discussion with family, other specialists and medical decision making of high complexity. I spent 45 minutes of neurocritical care time in the care of this patient.   Personally examined patient and images, and have participated in and made any corrections needed to history, physical, neuro exam,assessment and plan as stated above.  I have personally obtained the history, evaluated lab date, reviewed imaging studies and agree with radiology interpretations.    Sarina Ill, MD Neurology [phone] [facility]      To contact Stroke Continuity provider, please refer to [email or web address] After hours, contact General Neurology

## 2016-02-08 NOTE — Progress Notes (Signed)
PULMONARY / CRITICAL CARE MEDICINE   Name: Margaret [name] MRN: [id number] DOB: [birth year]    ADMISSION DATE:  02/01/2016 CONSULTATION DATE:  3/16  REFERRING MD:  TRH  CHIEF COMPLAINT:  CVA s/p neuro IR  HISTORY OF PRESENT ILLNESS:  60 year old female with past medical history as below, which includes hypertension, hyperlipidemia, COPD, diabetes mellitus, and CHF. She recently admitted in February of this year for acute splenic infarction without clear etiology, however at that time she was also diagnosed with hepatic cirrhosis and he was initially thought this was the cause of the infarct as echocardiogram the time showed no cardiac source of emboli. She was started on anticoagulation and was slated to obtain outpatient TEE.  She returned to the hospital on 3/11 with fever, headache and a small subarachnoid bleed as well as several small "punctate infarcts" consistent with embolic events.  Blood cultures were positive for strep viridans at that point.  TEE showed a 2cm vegetation on the mitral valve with severe mitral regurgitation.  Cardiothoracic surgery was consulted on 3/16.    On 3/16 she had a mechanical fall and had a CT head that was read as normal around 1400.  Apparently she was given ativan.  After return from CT imaging she was noted to have left facial droop, slurred speech around 4PM.  Stroke was called, CT head repeated, then IR was consulted for possible cerebral angiogram.  She underwent an angiogram showing a R MCA embolism and occluded right internal carotid artery embolism.  These were retrieved and then she underwent selective intra-arterial integrilin infusion.    She returned from the procedure intubated and hypertensive  SUBJECTIVE:  Did well overnight No PSV yet this am   VITAL SIGNS: BP 154/61 mmHg  Pulse 84  Temp(Src) 98.7 F (37.1 C) (Axillary)  Resp 17  Ht 5\' 5"  (1.651 m)  Wt 123.2 kg (271 lb 9.7 oz)  BMI 45.20 kg/m2  SpO2 100%  HEMODYNAMICS:     VENTILATOR SETTINGS: Vent Mode:  [-] PRVC FiO2 (%):  [40 %-50 %] 40 % Set Rate:  [14 bmp] 14 bmp Vt Set:  [460 mL] 460 mL PEEP:  [5 cmH20] 5 cmH20 Plateau Pressure:  [18 cmH20-26 cmH20] 21 cmH20  INTAKE / OUTPUT: I/O last 3 completed shifts: In: 5135.1 [I.V.:4585.1; IV Piggyback:550] Out: [id number]; Blood:100]  PHYSICAL EXAMINATION: Gen. Obese, intubated, in no distress, ENT -  no post nasal drip, Oral ET tube Neck: No JVD, no thyromegaly, no carotid bruits Lungs: no use of accessory muscles, no dullness to percussion, decreased without rales or rhonchi  Cardiovascular: Rhythm regular, heart sounds  normal, no murmurs, 1+ peripheral edema Abdomen: soft and non-tender, no hepatosplenomegaly, BS normal. Musculoskeletal: No deformities, no cyanosis or clubbing Neuro:  Sedated, unable to examine for focality Skin:  Warm, no lesions/ rash  LABS:  BMET  Recent Labs Lab 02/05/16 0322 02/07/16 0545 02/08/16 0338  NA 134* 136 139  K 4.3 4.3 3.9  CL 99* 101 98*  CO2 25 27 28   BUN 10 6 7   CREATININE 0.91 0.84 0.88  GLUCOSE 204* 180* 153*    Electrolytes  Recent Labs Lab 02/05/16 0322 02/07/16 0545 02/08/16 0338  CALCIUM 10.2 10.0 10.4*    CBC  Recent Labs Lab 02/05/16 0322 02/07/16 0545 02/08/16 0338  WBC 14.2* 24.5* 20.9*  HGB 12.9 10.4* 10.9*  HCT 41.5 34.9* 36.4  PLT 398 437* 454*    Coag's  Recent Labs Lab 02/07/16 0545  APTT 27  INR 1.32    Sepsis Markers No results for input(s): LATICACIDVEN, PROCALCITON, O2SATVEN in the last 168 hours.  ABG  Recent Labs Lab 02/06/16 1700 02/07/16 0003  PHART 7.430 7.310*  PCO2ART 43.6 59.3*  PO2ART 84.5 379.0*    Liver Enzymes  Recent Labs Lab 02/05/16 0322 02/07/16 0545  AST 15 11*  ALT 14 10*  ALKPHOS 76 67  BILITOT 0.7 0.2*  ALBUMIN 2.8* 2.3*    Cardiac Enzymes  Recent Labs Lab 02/05/16 0322 02/05/16 0802 02/05/16 1448  TROPONINI 0.04* 0.04* 0.05*     Glucose  Recent Labs Lab 02/06/16 1600 02/07/16 0739 02/07/16 1156 02/07/16 1659 02/07/16 2220 02/08/16 0755  GLUCAP 183* 150* 169* 131* 139* 138*    Imaging Ct Head Wo Contrast  02/07/2016  CLINICAL DATA:  Stroke. Post revascularization occluded right internal carotid artery terminus and proximal right MCA EXAM: CT HEAD WITHOUT CONTRAST TECHNIQUE: Contiguous axial images were obtained from the base of the skull through the vertex without intravenous contrast. COMPARISON:  CT head 02/07/2016 FINDINGS: Hypodensity right posterior limb internal capsule has become more apparent consistent with acute infarct. No other areas of acute infarct. Small volume subarachnoid hemorrhage difficult to see on today's study but remains present in the high right parietal region. No new area of hemorrhage. Ventricle size normal.  No shift of midline structures. Sinusitis with air-fluid level sphenoid sinus similar to earlier today. IMPRESSION: Acute infarct posterior limb internal capsule Small area of subarachnoid hemorrhage right parietal area stable. No new area of hemorrhage. Electronically Signed   By: Franchot Gallo M.D.   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 EXAM: MRI HEAD WITHOUT CONTRAST MRA HEAD WITHOUT CONTRAST TECHNIQUE: Multiplanar, multiecho pulse sequences of the brain and surrounding structures were obtained without intravenous contrast. Angiographic images of the head were obtained using MRA technique without contrast. COMPARISON:  Prior studies from 02/06/2016 and 02/07/2015. FINDINGS: MRI HEAD FINDINGS Diffusion-weighted imaging demonstrates multi focal  areas of patchy restricted diffusion involving the right MCA distribution. Specifically, there are patchy cortical and subcortical infarcts within the right frontal lobe, right parietal lobe, and right temporoparietal region. 17 mm infarct present within the posterior limb of the right internal capsule extending into the mesial right temporal lobe and hippocampus. Patchy infarct within the right centrum semi ovale. These are new relative to most recent MRI. There is patchy associated petechial hemorrhage within a few areas of infarction as seen on SWI sequence (series 10, image 87). Probable small amount of acute subarachnoid hemorrhage within the right parietal lobe (series 10, image 86). This is stable relative to most recent CTs, but new relative to most recent MRI. Additional more faint diffusion abnormality within the subcortical left parietal lobe, consistent with more subacute infarct, evolved from previous MRI (series 4, image 25). There appears to be a new small 6 mm cortical infarct within the left parietal lobe within this region (series 4, image 24). Previously noted subarachnoid hemorrhage within the left parieto-occipital region again seen, more prominent on today's study (series 10, image 74). While this may be related to differences in technique, possible small amount of new hemorrhage within this region not entirely excluded. Probable subacute small left cerebellar infarct also noted (  series 4, image 8), not seen on previous MRI. No other infarct. Major intracranial vascular flow voids maintained. No mass lesion, midline shift, or significant mass effect. No hydrocephalus. No extra-axial fluid collection. Major dural sinuses are grossly patent. Craniocervical junction within normal limits. Pituitary gland normal. No acute abnormality about the orbits. Scattered mucosal thickening within the ethmoidal air cells and maxillary sinuses. Mucosal thickening with fluid levels present within the sphenoid  sinuses. Patient appears to be intubated. Scattered opacity within the mastoid air cells bilaterally, right slightly worse than left. Inner ear structures grossly normal. Bone marrow signal intensity within normal limits. No acute scalp soft tissue abnormality. MRA HEAD FINDINGS ANTERIOR CIRCULATION: Study degraded by motion artifact. Visualized distal cervical segments of the internal carotid arteries are patent with antegrade flow. Petrous, cavernous, and supraclinoid segments widely patent. A1 segments, anterior communicating artery, and anterior cerebral arteries well opacified. M1 segments patent without stenosis or occlusion. Left MCA bifurcation normal. There is irregularity with ill definition of the right MCA bifurcation. Question of a focal outpouching measuring approximately 6 mm. Unclear whether this finding is related to motion artifact with normal branch vessels, represents a possible new aneurysm (potentially mycotic in nature given the history of endocarditis). Aneurysm might be potentially less favored as this an abnormality appears fairly linear in AP dimension, as would not be expected with aneurysm. Possible residual or new thrombus could also be considered. The right MCA branches are opacified distally. POSTERIOR CIRCULATION: Vertebral arteries patent to the vertebrobasilar junction. Left vertebral artery dominant. Posterior inferior cerebral arteries patent bilaterally. Basilar artery widely patent. Superior cerebellar arteries patent bilaterally. Both posterior cerebral arteries arise from the basilar artery and are well opacified to their distal aspects. Mild intracranial atheromatous disease better evaluated on recent CTA. IMPRESSION: MRI HEAD IMPRESSION: 1. Patchy multi focal acute right MCA territory infarcts involving the right frontal, parietal, and temporal lobes as above, with involvement of the posterior limb of the right internal capsule. Scattered petechial hemorrhage about a few of  these infarcts, with probable small volume subarachnoid hemorrhage within the right parietal lobe, similar relative prior CTs, but new relative to most recent MRI. 2. Probable new subcentimeter cortical infarct within the left parietal lobe, with interval evolution of previously identified punctate subcortical left parietal infarcts. 3. Small subacute appearing infarct within the left cerebellar hemisphere, new relative to most recent MRI from 02/01/2016. 4. Slight interval increase in prominence of subarachnoid hemorrhage within the left parieto-occipital region. While this finding may be related to technique, the possibility of small amount of re- hemorrhage within this region should be considered. Attention at follow-up CTs recommended. MRA HEAD IMPRESSION: 1. No large or proximal arterial branch occlusion within the intracranial circulation. 2. Question new 6 mm abnormality at the level of the right MCA bifurcation. Unclear whether this reflects motion artifact in relation to normal branch vessels, small amount of residual or new thrombus, and less likely new aneurysm (although this is not entirely excluded, with mycotic aneurysm certainly a possibility given the history of endocarditis). Further evaluation with dedicated CTA recommended. 3. No other new abnormality within the intracranial circulation. Electronically Signed   By: Jeannine Boga M.D.   On: 02/08/2016 01:45   Mr Brain Wo Contrast  02/08/2016  CLINICAL DATA:  61 year old female with mitral valve endocarditis, with recently identified acute subarachnoid hemorrhage and punctate embolic strokes, likely related this septic emboli. Also status post recent acute right ICA occlusion (Likely septic emboli in nature), status post catheter directed  revascularization. EXAM: MRI HEAD WITHOUT CONTRAST MRA HEAD WITHOUT CONTRAST TECHNIQUE: Multiplanar, multiecho pulse sequences of the brain and surrounding structures were obtained without intravenous  contrast. Angiographic images of the head were obtained using MRA technique without contrast. COMPARISON:  Prior studies from 02/06/2016 and 02/07/2015. FINDINGS: MRI HEAD FINDINGS Diffusion-weighted imaging demonstrates multi focal areas of patchy restricted diffusion involving the right MCA distribution. Specifically, there are patchy cortical and subcortical infarcts within the right frontal lobe, right parietal lobe, and right temporoparietal region. 17 mm infarct present within the posterior limb of the right internal capsule extending into the mesial right temporal lobe and hippocampus. Patchy infarct within the right centrum semi ovale. These are new relative to most recent MRI. There is patchy associated petechial hemorrhage within a few areas of infarction as seen on SWI sequence (series 10, image 87). Probable small amount of acute subarachnoid hemorrhage within the right parietal lobe (series 10, image 86). This is stable relative to most recent CTs, but new relative to most recent MRI. Additional more faint diffusion abnormality within the subcortical left parietal lobe, consistent with more subacute infarct, evolved from previous MRI (series 4, image 25). There appears to be a new small 6 mm cortical infarct within the left parietal lobe within this region (series 4, image 24). Previously noted subarachnoid hemorrhage within the left parieto-occipital region again seen, more prominent on today's study (series 10, image 74). While this may be related to differences in technique, possible small amount of new hemorrhage within this region not entirely excluded. Probable subacute small left cerebellar infarct also noted (series 4, image 8), not seen on previous MRI. No other infarct. Major intracranial vascular flow voids maintained. No mass lesion, midline shift, or significant mass effect. No hydrocephalus. No extra-axial fluid collection. Major dural sinuses are grossly patent. Craniocervical junction  within normal limits. Pituitary gland normal. No acute abnormality about the orbits. Scattered mucosal thickening within the ethmoidal air cells and maxillary sinuses. Mucosal thickening with fluid levels present within the sphenoid sinuses. Patient appears to be intubated. Scattered opacity within the mastoid air cells bilaterally, right slightly worse than left. Inner ear structures grossly normal. Bone marrow signal intensity within normal limits. No acute scalp soft tissue abnormality. MRA HEAD FINDINGS ANTERIOR CIRCULATION: Study degraded by motion artifact. Visualized distal cervical segments of the internal carotid arteries are patent with antegrade flow. Petrous, cavernous, and supraclinoid segments widely patent. A1 segments, anterior communicating artery, and anterior cerebral arteries well opacified. M1 segments patent without stenosis or occlusion. Left MCA bifurcation normal. There is irregularity with ill definition of the right MCA bifurcation. Question of a focal outpouching measuring approximately 6 mm. Unclear whether this finding is related to motion artifact with normal branch vessels, represents a possible new aneurysm (potentially mycotic in nature given the history of endocarditis). Aneurysm might be potentially less favored as this an abnormality appears fairly linear in AP dimension, as would not be expected with aneurysm. Possible residual or new thrombus could also be considered. The right MCA branches are opacified distally. POSTERIOR CIRCULATION: Vertebral arteries patent to the vertebrobasilar junction. Left vertebral artery dominant. Posterior inferior cerebral arteries patent bilaterally. Basilar artery widely patent. Superior cerebellar arteries patent bilaterally. Both posterior cerebral arteries arise from the basilar artery and are well opacified to their distal aspects. Mild intracranial atheromatous disease better evaluated on recent CTA. IMPRESSION: MRI HEAD IMPRESSION: 1.  Patchy multi focal acute right MCA territory infarcts involving the right frontal, parietal, and temporal lobes as above,  with involvement of the posterior limb of the right internal capsule. Scattered petechial hemorrhage about a few of these infarcts, with probable small volume subarachnoid hemorrhage within the right parietal lobe, similar relative prior CTs, but new relative to most recent MRI. 2. Probable new subcentimeter cortical infarct within the left parietal lobe, with interval evolution of previously identified punctate subcortical left parietal infarcts. 3. Small subacute appearing infarct within the left cerebellar hemisphere, new relative to most recent MRI from 02/01/2016. 4. Slight interval increase in prominence of subarachnoid hemorrhage within the left parieto-occipital region. While this finding may be related to technique, the possibility of small amount of re- hemorrhage within this region should be considered. Attention at follow-up CTs recommended. MRA HEAD IMPRESSION: 1. No large or proximal arterial branch occlusion within the intracranial circulation. 2. Question new 6 mm abnormality at the level of the right MCA bifurcation. Unclear whether this reflects motion artifact in relation to normal branch vessels, small amount of residual or new thrombus, and less likely new aneurysm (although this is not entirely excluded, with mycotic aneurysm certainly a possibility given the history of endocarditis). Further evaluation with dedicated CTA recommended. 3. No other new abnormality within the intracranial circulation. Electronically Signed   By: Jeannine Boga M.D.   On: 02/08/2016 01:45   Dg Chest Port 1 View  02/08/2016  CLINICAL DATA:  Acute respiratory failure EXAM: PORTABLE CHEST 1 VIEW COMPARISON:  February 06, 2016 FINDINGS: The ET tube is in good position. No pneumothorax. No focal infiltrate. Stable cardiomediastinal silhouette. There is a small left effusion with associated  atelectasis. IMPRESSION: The ETT is in good position. A tiny left effusion with atelectasis is seen. Mild pulmonary venous congestion, improved in the interval. Electronically Signed   By: Dorise Bullion III M.D   On: 02/08/2016 08:09     STUDIES:  3/11 CT head > localized subarachnoid hemorrhage/debris in left parietal occipital region 3/11 CT angiogram> mild intracranial and extracranial atherosclerosis without stenosis, patent dural venous sinuses, no vascular malformation 3/11 MRI/MRA brain > small volume subarachnoid hemorrhage/debri left parieto-occipital region, few sub cm cortical subcortical ischemic infarcts in bilateral parietal lobes, negative MRA 3/14 EEG > no evidence of epileptiform discharges 3/15 TEE> 2 cm mitral valve vegetation with regurgitation 3/15 CT ab/pelvis> new splenic infarct, larger than before, enlarged spleen, diffuse hepatic steatosis, mosaic attenuation of lung bases, consider pulmonary arterial hypertension 3/16 CT head > No significant change from prior 3/16 CT head> decreased conspicuity of left parieto-occipital subarachnoid hemorrhage; ? New area of hemorrhage R parietal sulcus 3/16 IR angiogram> R internal carotid and R MCA occlusion 3/16 CT head > unchanged small volume R parietal and left parieto occipital subarachnoid hemorrhage 3/17 Head Ct > interval decrease small vol R parietal and L parietal-occipital bleed, no new bleed, no acute infarct yet 3/18 MRI brain >> patchy multifocal right MCA infarct, scattered parenchymal hemorrhage, probable new left parietal cortical infarct, subacute left cerebellar hemisphere CVA, slight interval increase in prominence of subarachnoid hemorrhage in the left parieto-occipital region  CULTURES: 3/11 blood > viridans strep 3/14 blood > negative  ANTIBIOTICS: 3/12  Ceftriaxone > 3/16 3/16 Ampicillin >   SIGNIFICANT EVENTS: 3/11 admission 3/16 acute ischemic stroke  LINES/TUBES: 3/16 ETT >   DISCUSSION: 60  y/o female with 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  Also found to have hepatic steatosis.  On 3/16 she underwent emergent  cerebral angiogram and clot retrieval from the R ICA artery and R MCA artery, returned from IR on the vent  ASSESSMENT / PLAN:  NEUROLOGIC A:   Acute ischemic stroke : R MCA and R ICA occlusion from emboli Subarachnoid hemorrhage on admission, ? From punctate emboli P:   RASS goal: -1 Propofol gtt Prn fentanyl Post op stroke orderset per stroke team Note Head ct from this am, will go for MRI Brain am 3/17  PULMONARY A: Acute respiratory failure on vent P:   Full vent support VAP bundle Follow CXR SBT/WUA daily, goal assess for extubation 3/18  CARDIOVASCULAR A:  Acute R MCA ischemic stroke Mildly elevated troponin in setting of stroke Mitral valve regurgitation and endocarditis P:  BP goal per neurology SBP 120-140 Prn labetalol for hypertension Valve repair under evaluation per TCTS. The fallout from acute CVA will affect plan, timing, etc.   RENAL A: Mild hyponatremia, improving P:   Monitor BMET and UOP Replace electrolytes as needed   GASTROINTESTINAL A:   No acute issues P:   Pepcid for stress ulcer prophylaxis NPO  HEMATOLOGIC A:   No acute issues P:  Monitor for bleeding SCD given subarachnoid hemorrhage and new ischemic stroke  INFECTIOUS A:   Spontaneous bacterial endocarditis with strep viridans P:   Continue ampicillin F/u ID recs  ENDOCRINE A:   Hyperglycemia P:   Target glucose 140-180 with acute ischemic stroke   FAMILY  - Updates: Husband at bedside 3/16 pm  - Inter-disciplinary family meet or Palliative Care meeting due by:  3/23   Summary - appears to be 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  Goal assess for extubation on 3/18 We will assess neurological deficits and decide whether she is a candidate for eventual mitral valve replacement.  Independent CC time 35 minutes   Baltazar Apo, MD, PhD 02/08/2016, 8:45 AM [facility] [phone] or if no answer [phone]

## 2016-02-08 NOTE — Evaluation (Signed)
Clinical/Bedside Swallow Evaluation Patient Details  Name: Margaret [name] MRN: [id number] Date of Birth: 28-May-1956  Today's Date: 02/08/2016 Time: SLP Start Time (ACUTE ONLY): [id number] SLP Stop Time (ACUTE ONLY): 1410 SLP Time Calculation (min) (ACUTE ONLY): 25 min  Past Medical History:  Past Medical History  Diagnosis Date  . Hypertension   . Hyperlipidemia   . COPD (chronic obstructive pulmonary disease) ([place]) 04/20/12  . Asthma   . Type II diabetes mellitus ([place])   . Anemia   . H/O hiatal hernia   . Osteoarthritis   . Pneumonia 04/2012  . Migraines   . Panic attacks 04/20/12  . Morbid obesity ([place])   . Heart murmur   . Bacterial endocarditis     Strep viridans   . Chronic diastolic CHF (congestive heart failure) ([place]) 04/27/2012  . Pulmonary hypertension assoc with unclear multi-factorial mechanisms ([place]) 01/23/2016  . Positive ANA (antinuclear antibody)   . 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 Stroke ([place])   . Subarachnoid bleed ([place]) 02/01/2016   Past Surgical History:  Past Surgical History  Procedure Laterality Date  . Laceration repair  1966    "right upper arm"  . Dilation and curettage of uterus  1990's  . Finger reattachment  1973    "right ring finger"  . Tee without cardioversion N/A 02/05/2016    Procedure: TRANSESOPHAGEAL ECHOCARDIOGRAM (TEE);  Surgeon: Larey Dresser, MD;  Location: [facility];  Service: Cardiovascular;  Laterality: N/A;  . Radiology with anesthesia N/A 02/06/2016    Procedure: RADIOLOGY WITH ANESTHESIA;  Surgeon: Luanne Bras, MD;  Location: [facility];  Service: Radiology;  Laterality: N/A;   HPI:  Pt is a 60 y.o. female with PMH includes hypertension, hyperlipidemia, COPD, diabetes mellitus, and CHF. Admitted 3/11 with fever, headache and a small subarachnoid bleed as well as several small "punctate infarcts" consistent with embolic events. on 3/16 pt had mechanical fall with normal head CT, afterwards was noted to have left facial droop,  slurred speech; angiogram showed R MCA embolism; intubated 3/16 to 3/18. Bedside swallow eval ordered to assess swallow function post-2 day intubation.   Assessment / Plan / Recommendation Clinical Impression  Pt with no overt s/s of aspiration at bedside with thin liquid or puree textures. With puree, pt did tend to move material around in mouth before posterior transit of bolus; however, no residuals or pocketing noted. Pt able to sustain attention to PO trials with minimal cues. Pt answering questions appropriately with 1-2 word utterances, able to follow 1-step commands. Given current cognitive status, aspiration risk appears moderate at this time, risk reduced with precautions. Recommend initiating dysphagia 1 diet, thin liquids, meds crushed in puree, full supervision to assist with feeding, cue small bites/ sips, ensure pt sitting upright as close to 90 degrees as possible, ensure pt is fully alert, check for pocketing/ residuals. Will continue to follow closely for diet tolerance/ consider advancement.    Aspiration Risk  Moderate aspiration risk    Diet Recommendation Dysphagia 1 (Puree);Thin liquid   Liquid Administration via: Straw Medication Administration: Crushed with puree Supervision: Staff to assist with self feeding;Full supervision/cueing for compensatory strategies Compensations: Slow rate;Small sips/bites;Minimize environmental distractions Postural Changes: Seated upright at 90 degrees    Other  Recommendations Oral Care Recommendations: Oral care BID   Follow up Recommendations  Inpatient Rehab    Frequency and Duration min 2x/week  2 weeks       Prognosis Prognosis for Safe Diet Advancement: Good Barriers to Reach Goals: Cognitive  deficits      Swallow Study   General HPI: Pt is a 60 y.o. female with PMH includes hypertension, hyperlipidemia, COPD, diabetes mellitus, and CHF. Admitted 3/11 with fever, headache and a small subarachnoid bleed as well as several  small "punctate infarcts" consistent with embolic events. on 3/16 pt had mechanical fall with normal head CT, afterwards was noted to have left facial droop, slurred speech; angiogram showed R MCA embolism; intubated 3/16 to 3/18. Bedside swallow eval ordered to assess swallow function post-2 day intubation. Type of Study: Bedside Swallow Evaluation Diet Prior to this Study: NPO Temperature Spikes Noted: No Respiratory Status: Nasal cannula History of Recent Intubation: Yes Length of Intubations (days): 2 days Date extubated: 02/08/16 Behavior/Cognition: Alert;Cooperative;Requires cueing Oral Cavity Assessment: Within Functional Limits Oral Care Completed by SLP: Yes Vision: Functional for self-feeding Self-Feeding Abilities: Needs assist;Able to feed self Patient Positioning: Upright in bed Baseline Vocal Quality: Low vocal intensity;Hoarse Volitional Cough: Strong Volitional Swallow: Able to elicit    Oral/Motor/Sensory Function Overall Oral Motor/Sensory Function: Moderate impairment Facial ROM: Reduced left Facial Symmetry: Abnormal symmetry left Facial Strength: Reduced left Lingual ROM: Within Functional Limits Lingual Symmetry: Abnormal symmetry left   Ice Chips Ice chips: Within functional limits Presentation: Spoon   Thin Liquid Thin Liquid: Impaired Presentation: Cup;Straw Oral Phase Impairments: Reduced labial seal Pharyngeal  Phase Impairments: Suspected delayed Swallow    Nectar Thick Nectar Thick Liquid: Not tested   Honey Thick Honey Thick Liquid: Not tested   Puree Puree: Within functional limits Presentation: Spoon   Solid   GO   Solid: Not tested        Kern Reap, [place], CCC-SLP 02/08/2016,2:24 PM [phone]

## 2016-02-08 NOTE — Progress Notes (Signed)
Attempt made to place IV for CTA- unsuccessful. IV team RN attempted- pt refused any more attempts. States hurts too much in joints on Left arm. Will not allow RN to try on R arm d/t previous surgery. On call MD aware. Will try again tomorrow.

## 2016-02-09 LAB — CBC WITH DIFFERENTIAL/PLATELET
BASOS ABS: 0.1 10*3/uL (ref 0.0–0.1)
BASOS PCT: 0 %
EOS ABS: 0.3 10*3/uL (ref 0.0–0.7)
EOS PCT: 2 %
HCT: 37.4 % (ref 36.0–46.0)
Hemoglobin: 11 g/dL — ABNORMAL LOW (ref 12.0–15.0)
Lymphocytes Relative: 12 %
Lymphs Abs: 1.9 10*3/uL (ref 0.7–4.0)
MCH: 26.6 pg (ref 26.0–34.0)
MCHC: 29.4 g/dL — AB (ref 30.0–36.0)
MCV: 90.3 fL (ref 78.0–100.0)
MONO ABS: 1 10*3/uL (ref 0.1–1.0)
MONOS PCT: 6 %
NEUTROS PCT: 80 %
Neutro Abs: 13 10*3/uL — ABNORMAL HIGH (ref 1.7–7.7)
PLATELETS: 540 10*3/uL — AB (ref 150–400)
RBC: 4.14 MIL/uL (ref 3.87–5.11)
RDW: 15.2 % (ref 11.5–15.5)
WBC: 16.1 10*3/uL — AB (ref 4.0–10.5)

## 2016-02-09 LAB — CULTURE, BLOOD (ROUTINE X 2)
Culture: NO GROWTH
Culture: NO GROWTH

## 2016-02-09 LAB — GLUCOSE, CAPILLARY
Glucose-Capillary: 129 mg/dL — ABNORMAL HIGH (ref 65–99)
Glucose-Capillary: 140 mg/dL — ABNORMAL HIGH (ref 65–99)
Glucose-Capillary: 145 mg/dL — ABNORMAL HIGH (ref 65–99)

## 2016-02-09 MED ORDER — DEXTROSE 5 % IV SOLN
500.0000 mg | Freq: Once | INTRAVENOUS | Status: AC
  Administered 2016-02-09: 500 mg via INTRAVENOUS
  Filled 2016-02-09: qty 5

## 2016-02-09 NOTE — Progress Notes (Signed)
Patient ID: Margaret [name], female   DOB: 06-16-1956, 60 y.o.   MRN: [id number]    Referring Physician(s): Rosalin Hawking  Supervising Physician: Luanne Bras  Chief Complaint: Follow up after cerebral angiogram with revascularization  Subjective: Pt sleepy, follows some commands  Allergies: Codeine and Latex  Medications: Prior to Admission medications   Medication Sig Start Date End Date Taking? Authorizing Provider  albuterol (PROVENTIL HFA;VENTOLIN HFA) 108 (90 BASE) MCG/ACT inhaler Inhale 2 puffs into the lungs every 4 (four) hours as needed. For shortness of breath. 04/30/12  Yes Shanker Kristeen Mans, MD  albuterol (PROVENTIL) (2.5 MG/3ML) 0.083% nebulizer solution Take 2.5 mg by nebulization at bedtime.    Yes Historical Provider, MD  Alogliptin-Metformin HCl 12.03-999 MG TABS Take 1 tablet by mouth 2 (two) times daily.   Yes Historical Provider, MD  aspirin 81 MG chewable tablet Chew 81 mg by mouth daily.   Yes Historical Provider, MD  atenolol (TENORMIN) 50 MG tablet Take 12.5 mg by mouth 2 (two) times daily.    Yes Historical Provider, MD  cholecalciferol (VITAMIN D) 1000 units tablet Take 1,000 Units by mouth 2 (two) times daily.   Yes Historical Provider, MD  furosemide (LASIX) 40 MG tablet Take 40 mg by mouth daily.    Yes Historical Provider, MD  ibuprofen (ADVIL,MOTRIN) 200 MG tablet Take 600 mg by mouth every 6 (six) hours as needed for headache or mild pain.   Yes Historical Provider, MD  insulin NPH-regular (NOVOLIN 70/30) (70-30) 100 UNIT/ML injection Inject 6-16 Units into the skin 2 (two) times daily with a meal.   Yes Historical Provider, MD  loperamide (IMODIUM) 2 MG capsule Take 2 mg by mouth daily as needed for diarrhea or loose stools.   Yes Historical Provider, MD  lovastatin (MEVACOR) 20 MG tablet Take 10 mg by mouth at bedtime.   Yes Historical Provider, MD  naproxen sodium (ANAPROX) 220 MG tablet Take 220 mg by mouth 2 (two) times daily as needed (pain).    Yes Historical Provider, MD  nitroGLYCERIN (NITROSTAT) 0.4 MG SL tablet Place 1 tablet (0.4 mg total) under the tongue every 5 (five) minutes x 3 doses as needed for chest pain. 06/20/12 02/01/16 Yes Jettie Booze, MD  oxyCODONE-acetaminophen (PERCOCET) 10-325 MG tablet Take 1 tablet by mouth 2 (two) times daily as needed for pain.   Yes Historical Provider, MD  Potassium 99 MG TABS Take 99 mg by mouth every morning.   Yes Historical Provider, MD  Rivaroxaban (XARELTO STARTER PACK) 15 & 20 MG TBPK Take as directed on package: Start with one 15mg  tablet by mouth twice a day with food. On Day 22, switch to one 20mg  tablet once a day with food. 01/16/16  Yes Maryann Mikhail, DO  spironolactone (ALDACTONE) 25 MG tablet Take 50 mg by mouth daily.    Yes Historical Provider, MD  ciprofloxacin (CIPRO) 500 MG tablet Take 1 tablet (500 mg total) by mouth 2 (two) times daily. Patient not taking: Reported on 02/01/2016 01/16/16   Velta Addison Mikhail, DO  metroNIDAZOLE (FLAGYL) 500 MG tablet Take 1 tablet (500 mg total) by mouth every 8 (eight) hours. Patient not taking: Reported on 02/01/2016 01/16/16   Cristal Ford, DO    Vital Signs: BP 137/71 mmHg  Pulse 77  Temp(Src) 98.2 F (36.8 C) (Oral)  Resp 23  Ht 5\' 5"  (1.651 m)  Wt 271 lb 9.7 oz (123.2 kg)  BMI 45.20 kg/m2  SpO2 96%  Physical Exam: Abd: soft,  morbidly obese, both groin sites are c/d/i with no evidence of bleeding or edema. Neuro: unable to follow me with her eyes, but says she's tired.  Good movement of right side, but minimal movement of left foot and no movement of left arm.  Imaging: Ct Head Wo Contrast  02/07/2016  CLINICAL DATA:  Stroke. Post revascularization occluded right internal carotid artery terminus and proximal right MCA EXAM: CT HEAD WITHOUT CONTRAST TECHNIQUE: Contiguous axial images were obtained from the base of the skull through the vertex without intravenous contrast. COMPARISON:  CT head 02/07/2016 FINDINGS:  Hypodensity right posterior limb internal capsule has become more apparent consistent with acute infarct. No other areas of acute infarct. Small volume subarachnoid hemorrhage difficult to see on today's study but remains present in the high right parietal region. No new area of hemorrhage. Ventricle size normal.  No shift of midline structures. Sinusitis with air-fluid level sphenoid sinus similar to earlier today. IMPRESSION: Acute infarct posterior limb internal capsule Small area of subarachnoid hemorrhage right parietal area stable. No new area of hemorrhage. Electronically Signed   By: Franchot Gallo M.D.   On: 02/07/2016 18:44   Ct Head Wo Contrast  02/07/2016  CLINICAL DATA:  Follow-up exam for acute stroke, status post catheter directed revascularization. EXAM: CT HEAD WITHOUT CONTRAST TECHNIQUE: Contiguous axial images were obtained from the base of the skull through the vertex without intravenous contrast. COMPARISON:  Prior CT from 02/06/2016 as well as multiple previous studies. FINDINGS: Previously identified small volume acute subarachnoid hemorrhage within the right parieto-occipital region is slightly less conspicuous on today's study. Additionally, known subarachnoid hemorrhage/ debris within the left occipital region not well seen. No new intracranial hemorrhage. No definite evolving large vessel territory infarct identified. No mass lesion or midline shift. No mass effect. No hydrocephalus. No extra-axial fluid collection. Scalp soft tissues within normal limits. No acute abnormality about the orbits. Scattered fluid levels with opacity within the sphenoid ethmoidal sinuses. Patient appears to be intubated. No mastoid effusion. Calvarium intact. IMPRESSION: 1. Interval decrease in conspicuity of small volume right parietal and left parieto-occipital hemorrhage. No new intracranial hemorrhage identified. 2. No definite acute or evolving infarct identified by CT. Electronically Signed   By:  Jeannine Boga M.D.   On: 02/07/2016 03:20   Ct Head Wo Contrast  02/06/2016  CLINICAL DATA:  She stroke. Left facial droop, left hemiplegia, slurred speech, left neglect, and right gaze. Status post cerebral angiography and revascularization of occluded right ICA terminus and right MCA. EXAM: CT HEAD WITHOUT CONTRAST TECHNIQUE: Contiguous axial images were obtained from the base of the skull through the vertex without intravenous contrast. COMPARISON:  Head CT 1743 hours today FINDINGS: Small volume right parietal subarachnoid hemorrhage is unchanged, as is small volume residual left parieto-occipital subarachnoid hemorrhage. No definite edema suggestive of acute infarct is identified. No mass, midline shift, or extra-axial fluid collection is seen. Orbits are unremarkable. An endotracheal tube is partially visualized, with secretions noted in the posterior nasal cavity and pharynx. Mild mucosal thickening/small volume secretions are also noted in the right sphenoid sinus and right frontal sinus. Mastoid air cells are clear. IMPRESSION: 1. Unchanged small volume right parietal and left parieto-occipital subarachnoid hemorrhage. 2. No acute infarct apparent by CT. Electronically Signed   By: Logan Bores M.D.   On: 02/06/2016 21:59   Ct Head Wo Contrast  02/06/2016  CLINICAL DATA:  Stroke.  Left-sided weakness.  Endocarditis. EXAM: CT HEAD WITHOUT CONTRAST TECHNIQUE: Contiguous axial images were  obtained from the base of the skull through the vertex without intravenous contrast. COMPARISON:  Head CT 02/06/2016 at 1400 hours. Head MRI and CT 02/01/2016. FINDINGS: There is a subcentimeter focus of hyperattenuation associated with a right parietal sulcus (series 2, image 27) which may reflect trace subarachnoid hemorrhage/ proteinaceous debris. This is unchanged from today's earlier CT but is new from 02/01/2016. Left parietal subarachnoid hemorrhage/debris on the 02/01/2016 CT is much less conspicuous now.  There is no evidence of acute large territory infarct, mass, midline shift, or extra-axial fluid collection. The punctate acute infarcts on the recent prior MRI are not well seen by CT. There is mild generalized cerebral atrophy. Orbits are unremarkable. Minimal paranasal sinus mucosal thickening is noted. The mastoid air cells are clear. No acute osseous abnormality is identified. Calcified atherosclerosis is noted at the skull base. IMPRESSION: 1. Decreased conspicuity of left parieto-occipital subarachnoid hemorrhage/debris compared to the studies from 02/01/2016. 2. Small focus of subarachnoid hemorrhage/debris in a right parietal sulcus, unchanged from today's earlier study but new from 02/01/2016. This may reflect a new focus of hemorrhage or redistribution of the pre-existing blood products. Electronically Signed   By: Logan Bores M.D.   On: 02/06/2016 18:26   Ct Head Wo Contrast  02/06/2016  CLINICAL DATA:  Slip and fall today with slurred speech and hypoxia EXAM: CT HEAD WITHOUT CONTRAST TECHNIQUE: Contiguous axial images were obtained from the base of the skull through the vertex without intravenous contrast. COMPARISON:  02/01/2016 FINDINGS: Bony calvarium is intact. No findings to suggest acute hemorrhage, acute infarction or space-occupying mass lesion is seen. Some gyral swelling is noted similar to that seen on the prior exam in the left posterior parietal region. This is stable from the prior exam. No acute abnormality is noted. IMPRESSION: No significant change from the prior exam. No acute hemorrhage is noted. Electronically Signed   By: Inez Catalina M.D.   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 Also status post recent acute right ICA occlusion (Likely septic emboli in nature), status  post catheter directed revascularization. EXAM: MRI HEAD WITHOUT CONTRAST MRA HEAD WITHOUT CONTRAST TECHNIQUE: Multiplanar, multiecho pulse sequences of the brain and surrounding structures were obtained without intravenous contrast. Angiographic images of the head were obtained using MRA technique without contrast. COMPARISON:  Prior studies from 02/06/2016 and 02/07/2015. FINDINGS: MRI HEAD FINDINGS Diffusion-weighted imaging demonstrates multi focal areas of patchy restricted diffusion involving the right MCA distribution. Specifically, there are patchy cortical and subcortical infarcts within the right frontal lobe, right parietal lobe, and right temporoparietal region. 17 mm infarct present within the posterior limb of the right internal capsule extending into the mesial right temporal lobe and hippocampus. Patchy infarct within the right centrum semi ovale. These are new relative to most recent MRI. There is patchy associated petechial hemorrhage within a few areas of infarction as seen on SWI sequence (series 10, image 87). Probable small amount of acute subarachnoid hemorrhage within the right parietal lobe (series 10, image 86). This is stable relative to most recent CTs, but new relative to most recent MRI. Additional more faint diffusion abnormality within the subcortical left parietal lobe, consistent with more subacute infarct, evolved from previous MRI (series 4, image 25). There appears to be a new small 6 mm cortical infarct within the left parietal lobe  within this region (series 4, image 24). Previously noted subarachnoid hemorrhage within the left parieto-occipital region again seen, more prominent on today's study (series 10, image 74). While this may be related to differences in technique, possible small amount of new hemorrhage within this region not entirely excluded. Probable subacute small left cerebellar infarct also noted (series 4, image 8), not seen on previous MRI. No other infarct.  Major intracranial vascular flow voids maintained. No mass lesion, midline shift, or significant mass effect. No hydrocephalus. No extra-axial fluid collection. Major dural sinuses are grossly patent. Craniocervical junction within normal limits. Pituitary gland normal. No acute abnormality about the orbits. Scattered mucosal thickening within the ethmoidal air cells and maxillary sinuses. Mucosal thickening with fluid levels present within the sphenoid sinuses. Patient appears to be intubated. Scattered opacity within the mastoid air cells bilaterally, right slightly worse than left. Inner ear structures grossly normal. Bone marrow signal intensity within normal limits. No acute scalp soft tissue abnormality. MRA HEAD FINDINGS ANTERIOR CIRCULATION: Study degraded by motion artifact. Visualized distal cervical segments of the internal carotid arteries are patent with antegrade flow. Petrous, cavernous, and supraclinoid segments widely patent. A1 segments, anterior communicating artery, and anterior cerebral arteries well opacified. M1 segments patent without stenosis or occlusion. Left MCA bifurcation normal. There is irregularity with ill definition of the right MCA bifurcation. Question of a focal outpouching measuring approximately 6 mm. Unclear whether this finding is related to motion artifact with normal branch vessels, represents a possible new aneurysm (potentially mycotic in nature given the history of endocarditis). Aneurysm might be potentially less favored as this an abnormality appears fairly linear in AP dimension, as would not be expected with aneurysm. Possible residual or new thrombus could also be considered. The right MCA branches are opacified distally. POSTERIOR CIRCULATION: Vertebral arteries patent to the vertebrobasilar junction. Left vertebral artery dominant. Posterior inferior cerebral arteries patent bilaterally. Basilar artery widely patent. Superior cerebellar arteries patent  bilaterally. Both posterior cerebral arteries arise from the basilar artery and are well opacified to their distal aspects. Mild intracranial atheromatous disease better evaluated on recent CTA. IMPRESSION: MRI HEAD IMPRESSION: 1. Patchy multi focal acute right MCA territory infarcts involving the right frontal, parietal, and temporal lobes as above, with involvement of the posterior limb of the right internal capsule. Scattered petechial hemorrhage about a few of these infarcts, with probable small volume subarachnoid hemorrhage within the right parietal lobe, similar relative prior CTs, but new relative to most recent MRI. 2. Probable new subcentimeter cortical infarct within the left parietal lobe, with interval evolution of previously identified punctate subcortical left parietal infarcts. 3. Small subacute appearing infarct within the left cerebellar hemisphere, new relative to most recent MRI from 02/01/2016. 4. Slight interval increase in prominence of subarachnoid hemorrhage within the left parieto-occipital region. While this finding may be related to technique, the possibility of small amount of re- hemorrhage within this region should be considered. Attention at follow-up CTs recommended. MRA HEAD IMPRESSION: 1. No large or proximal arterial branch occlusion within the intracranial circulation. 2. Question new 6 mm abnormality at the level of the right MCA bifurcation. Unclear whether this reflects motion artifact in relation to normal branch vessels, small amount of residual or new thrombus, and less likely new aneurysm (although this is not entirely excluded, with mycotic aneurysm certainly a possibility given the history of endocarditis). Further evaluation with dedicated CTA recommended. 3. No other new abnormality within the intracranial circulation. Electronically Signed   By: Jeannine Boga  M.D.   On: 02/08/2016 01:45   Mr Brain Wo Contrast  02/08/2016  CLINICAL DATA:  60 year old female  with mitral valve endocarditis, with recently identified acute subarachnoid hemorrhage and punctate embolic strokes, likely related this septic emboli. 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 EXAM: MRI HEAD WITHOUT CONTRAST MRA HEAD WITHOUT CONTRAST TECHNIQUE: Multiplanar, multiecho pulse sequences of the brain and surrounding structures were obtained without intravenous contrast. Angiographic images of the head were obtained using MRA technique without contrast. COMPARISON:  Prior studies from 02/06/2016 and 02/07/2015. FINDINGS: MRI HEAD FINDINGS Diffusion-weighted imaging demonstrates multi focal areas of patchy restricted diffusion involving the right MCA distribution. Specifically, there are patchy cortical and subcortical infarcts within the right frontal lobe, right parietal lobe, and right temporoparietal region. 17 mm infarct present within the posterior limb of the right internal capsule extending into the mesial right temporal lobe and hippocampus. Patchy infarct within the right centrum semi ovale. These are new relative to most recent MRI. There is patchy associated petechial hemorrhage within a few areas of infarction as seen on SWI sequence (series 10, image 87). Probable small amount of acute subarachnoid hemorrhage within the right parietal lobe (series 10, image 86). This is stable relative to most recent CTs, but new relative to most recent MRI. Additional more faint diffusion abnormality within the subcortical left parietal lobe, consistent with more subacute infarct, evolved from previous MRI (series 4, image 25). There appears to be a new small 6 mm cortical infarct within the left parietal lobe within this region (series 4, image 24). Previously noted subarachnoid hemorrhage within the left parieto-occipital region again seen, more prominent on today's study (series 10, image 74). While this may be  related to differences in technique, possible small amount of new hemorrhage within this region not entirely excluded. Probable subacute small left cerebellar infarct also noted (series 4, image 8), not seen on previous MRI. No other infarct. Major intracranial vascular flow voids maintained. No mass lesion, midline shift, or significant mass effect. No hydrocephalus. No extra-axial fluid collection. Major dural sinuses are grossly patent. Craniocervical junction within normal limits. Pituitary gland normal. No acute abnormality about the orbits. Scattered mucosal thickening within the ethmoidal air cells and maxillary sinuses. Mucosal thickening with fluid levels present within the sphenoid sinuses. Patient appears to be intubated. Scattered opacity within the mastoid air cells bilaterally, right slightly worse than left. Inner ear structures grossly normal. Bone marrow signal intensity within normal limits. No acute scalp soft tissue abnormality. MRA HEAD FINDINGS ANTERIOR CIRCULATION: Study degraded by motion artifact. Visualized distal cervical segments of the internal carotid arteries are patent with antegrade flow. Petrous, cavernous, and supraclinoid segments widely patent. A1 segments, anterior communicating artery, and anterior cerebral arteries well opacified. M1 segments patent without stenosis or occlusion. Left MCA bifurcation normal. There is irregularity with ill definition of the right MCA bifurcation. Question of a focal outpouching measuring approximately 6 mm. Unclear whether this finding is related to motion artifact with normal branch vessels, represents a possible new aneurysm (potentially mycotic in nature given the history of endocarditis). Aneurysm might be potentially less favored as this an abnormality appears fairly linear in AP dimension, as would not be expected with aneurysm. Possible residual or new thrombus could also be considered. The right MCA branches are opacified distally.  POSTERIOR CIRCULATION: Vertebral arteries patent to the vertebrobasilar junction. Left vertebral artery dominant. Posterior inferior cerebral arteries patent bilaterally. Basilar artery  widely patent. Superior cerebellar arteries patent bilaterally. Both posterior cerebral arteries arise from the basilar artery and are well opacified to their distal aspects. Mild intracranial atheromatous disease better evaluated on recent CTA. IMPRESSION: MRI HEAD IMPRESSION: 1. Patchy multi focal acute right MCA territory infarcts involving the right frontal, parietal, and temporal lobes as above, with involvement of the posterior limb of the right internal capsule. Scattered petechial hemorrhage about a few of these infarcts, with probable small volume subarachnoid hemorrhage within the right parietal lobe, similar relative prior CTs, but new relative to most recent MRI. 2. Probable new subcentimeter cortical infarct within the left parietal lobe, with interval evolution of previously identified punctate subcortical left parietal infarcts. 3. Small subacute appearing infarct within the left cerebellar hemisphere, new relative to most recent MRI from 02/01/2016. 4. Slight interval increase in prominence of subarachnoid hemorrhage within the left parieto-occipital region. While this finding may be related to technique, the possibility of small amount of re- hemorrhage within this region should be considered. Attention at follow-up CTs recommended. MRA HEAD IMPRESSION: 1. No large or proximal arterial branch occlusion within the intracranial circulation. 2. Question new 6 mm abnormality at the level of the right MCA bifurcation. Unclear whether this reflects motion artifact in relation to normal branch vessels, small amount of residual or new thrombus, and less likely new aneurysm (although this is not entirely excluded, with mycotic aneurysm certainly a possibility given the history of endocarditis). Further evaluation with  dedicated CTA recommended. 3. No other new abnormality within the intracranial circulation. Electronically Signed   By: Jeannine Boga M.D.   On: 02/08/2016 01:45   Dg Chest Port 1 View  02/08/2016  CLINICAL DATA:  Acute respiratory failure EXAM: PORTABLE CHEST 1 VIEW COMPARISON:  February 06, 2016 FINDINGS: The ET tube is in good position. No pneumothorax. No focal infiltrate. Stable cardiomediastinal silhouette. There is a small left effusion with associated atelectasis. IMPRESSION: The ETT is in good position. A tiny left effusion with atelectasis is seen. Mild pulmonary venous congestion, improved in the interval. Electronically Signed   By: Dorise Bullion III M.D   On: 02/08/2016 08:09   Portable Chest Xray  02/06/2016  CLINICAL DATA:  Acute respiratory failure. EXAM: PORTABLE CHEST 1 VIEW COMPARISON:  Earlier the same day FINDINGS: New endotracheal tube is in good position, tip between the clavicular heads and carina. Diffuse interstitial coarsening with small left effusion. Chronic cardiomegaly. IMPRESSION: 1. New endotracheal tube in good position. 2. CHF pattern with modest progression from earlier today. Small left effusion. Electronically Signed   By: Monte Fantasia M.D.   On: 02/06/2016 22:38   Dg Chest Port 1 View  02/06/2016  CLINICAL DATA:  Shortness of breath today. EXAM: PORTABLE CHEST 1 VIEW COMPARISON:  02/01/2016 FINDINGS: The heart is enlarged but stable. There is tortuosity, ectasia and calcification of the thoracic aorta. There is vascular congestion and interstitial pulmonary edema. No definite pleural effusions. The bony thorax is intact. IMPRESSION: Mild CHF. Electronically Signed   By: Marijo Sanes M.D.   On: 02/06/2016 15:19    Labs:  CBC:  Recent Labs  02/03/16 1110 02/05/16 0322 02/07/16 0545 02/08/16 0338  WBC 15.5* 14.2* 24.5* 20.9*  HGB 12.3 12.9 10.4* 10.9*  HCT 41.1 41.5 34.9* 36.4  PLT 474* 398 437* 454*    COAGS:  Recent Labs  01/31/16 2232  02/07/16 0545  INR 2.16* 1.32  APTT 36 27    BMP:  Recent Labs  02/03/16 0032  02/05/16 0322 02/07/16 0545 02/08/16 0338  NA 134* 134* 136 139  K 4.8 4.3 4.3 3.9  CL 98* 99* 101 98*  CO2 26 25 27 28   GLUCOSE 198* 204* 180* 153*  BUN 20 10 6 7   CALCIUM 11.0* 10.2 10.0 10.4*  CREATININE 0.92 0.91 0.84 0.88  GFRNONAA >60 >60 >60 >60  GFRAA >60 >60 >60 >60    LIVER FUNCTION TESTS:  Recent Labs  01/15/16 0543 01/31/16 2232 02/05/16 0322 02/07/16 0545  BILITOT 0.6 0.5 0.7 0.2*  AST 16 18 15  11*  ALT 15 12* 14 10*  ALKPHOS 78 75 76 67  PROT 6.5 7.2 6.9 5.9*  ALBUMIN 2.7* 3.1* 2.8* 2.3*    Assessment and Plan: R MCA and Rt ICA revascularization/ clot retrieval with dr Estanislado Pandy 3/16 -patient is stable -sites are clean -will need follow up with Dr. Estanislado Pandy in 2-3 weeks.  Electronically Signed: Henreitta Cea 02/09/2016, 11:16 AM   I spent a total of 15 Minutes at the the patient's bedside AND on the patient's hospital floor or unit, greater than 50% of which was counseling/coordinating care for CVA s/p revascularization

## 2016-02-09 NOTE — Progress Notes (Signed)
PULMONARY / CRITICAL CARE MEDICINE   Name: Margaret [name] MRN: [id number] DOB: Mar 23, 1956    ADMISSION DATE:  02/01/2016 CONSULTATION DATE:  3/16  REFERRING MD:  TRH  CHIEF COMPLAINT:  CVA s/p neuro IR  HISTORY OF PRESENT ILLNESS:  60 year old female with past medical history as below, which includes hypertension, hyperlipidemia, COPD, diabetes mellitus, and CHF. She recently admitted in February of this year for acute splenic infarction without clear etiology, however at that time she was also diagnosed with hepatic cirrhosis and he was initially thought this was the cause of the infarct as echocardiogram the time showed no cardiac source of emboli. She was started on anticoagulation and was slated to obtain outpatient TEE.  She returned to the hospital on 3/11 with fever, headache and a small subarachnoid bleed as well as several small "punctate infarcts" consistent with embolic events.  Blood cultures were positive for strep viridans at that point.  TEE showed a 2cm vegetation on the mitral valve with severe mitral regurgitation.  Cardiothoracic surgery was consulted on 3/16.    On 3/16 she had a mechanical fall and had a CT head that was read as normal around 1400.  Apparently she was given ativan.  After return from CT imaging she was noted to have left facial droop, slurred speech around 4PM.  Stroke was called, CT head repeated, then IR was consulted for possible cerebral angiogram.  She underwent an angiogram showing a R MCA embolism and occluded right internal carotid artery embolism.  These were retrieved and then she underwent selective intra-arterial integrilin infusion.    She returned from the procedure intubated and hypertensive  SUBJECTIVE:  Tolerated extubation 3/18   VITAL SIGNS: BP 126/54 mmHg  Pulse 83  Temp(Src) 98.2 F (36.8 C) (Oral)  Resp 19  Ht 5\' 5"  (1.651 m)  Wt 123.2 kg (271 lb 9.7 oz)  BMI 45.20 kg/m2  SpO2 94%  HEMODYNAMICS:    VENTILATOR  SETTINGS:    INTAKE / OUTPUT: I/O last 3 completed shifts: In: 3598.5 [P.O.:205; I.V.:2793.5; IV Piggyback:600] Out: 5050 [Urine:5050]  PHYSICAL EXAMINATION: Gen. Obese, comfortable in bed ENT -  no post nasal drip, some dysarthria,  Neck: No JVD, no stridor Lungs: no use of accessory muscles,  Cardiovascular: Rhythm regular, heart sounds  normal, no murmurs, 1+ peripheral edema Abdomen: soft and non-tender, no hepatosplenomegaly, BS normal. Musculoskeletal: No deformities, no cyanosis or clubbing Neuro:  Sedated, unable to examine for focality Skin:  Warm, no lesions/ rash  LABS:  BMET  Recent Labs Lab 02/05/16 0322 02/07/16 0545 02/08/16 0338  NA 134* 136 139  K 4.3 4.3 3.9  CL 99* 101 98*  CO2 25 27 28   BUN 10 6 7   CREATININE 0.91 0.84 0.88  GLUCOSE 204* 180* 153*    Electrolytes  Recent Labs Lab 02/05/16 0322 02/07/16 0545 02/08/16 0338  CALCIUM 10.2 10.0 10.4*    CBC  Recent Labs Lab 02/05/16 0322 02/07/16 0545 02/08/16 0338  WBC 14.2* 24.5* 20.9*  HGB 12.9 10.4* 10.9*  HCT 41.5 34.9* 36.4  PLT 398 437* 454*    Coag's  Recent Labs Lab 02/07/16 0545  APTT 27  INR 1.32    Sepsis Markers No results for input(s): LATICACIDVEN, PROCALCITON, O2SATVEN in the last 168 hours.  ABG  Recent Labs Lab 02/06/16 1700 02/07/16 0003  PHART 7.430 7.310*  PCO2ART 43.6 59.3*  PO2ART 84.5 379.0*    Liver Enzymes  Recent Labs Lab 02/05/16 0322 02/07/16 0545  AST  15 11*  ALT 14 10*  ALKPHOS 76 67  BILITOT 0.7 0.2*  ALBUMIN 2.8* 2.3*    Cardiac Enzymes  Recent Labs Lab 02/05/16 0322 02/05/16 0802 02/05/16 1448  TROPONINI 0.04* 0.04* 0.05*    Glucose  Recent Labs Lab 02/07/16 2220 02/08/16 0755 02/08/16 1138 02/08/16 1723 02/08/16 2153 02/09/16 0748  GLUCAP 139* 138* 151* 134* 137* 129*    Imaging No results found.   STUDIES:  3/11 CT head > localized subarachnoid hemorrhage/debris in left parietal occipital  region 3/11 CT angiogram> mild intracranial and extracranial atherosclerosis without stenosis, patent dural venous sinuses, no vascular malformation 3/11 MRI/MRA brain > small volume subarachnoid hemorrhage/debri left parieto-occipital region, few sub cm cortical subcortical ischemic infarcts in bilateral parietal lobes, negative MRA 3/14 EEG > no evidence of epileptiform discharges 3/15 TEE> 2 cm mitral valve vegetation with regurgitation 3/15 CT ab/pelvis> new splenic infarct, larger than before, enlarged spleen, diffuse hepatic steatosis, mosaic attenuation of lung bases, consider pulmonary arterial hypertension 3/16 CT head > No significant change from prior 3/16 CT head> decreased conspicuity of left parieto-occipital subarachnoid hemorrhage; ? New area of hemorrhage R parietal sulcus 3/16 IR angiogram> R internal carotid and R MCA occlusion 3/16 CT head > unchanged small volume R parietal and left parieto occipital subarachnoid hemorrhage 3/17 Head Ct > interval decrease small vol R parietal and L parietal-occipital bleed, no new bleed, no acute infarct yet 3/18 MRI brain >> patchy multifocal right MCA infarct, scattered parenchymal hemorrhage, probable new left parietal cortical infarct, subacute left cerebellar hemisphere CVA, slight interval increase in prominence of subarachnoid hemorrhage in the left parieto-occipital region  CULTURES: 3/11 blood > viridans strep 3/14 blood > negative  ANTIBIOTICS: 3/12  Ceftriaxone > 3/16 3/16 Ampicillin >   SIGNIFICANT EVENTS: 3/11 admission 3/16 acute ischemic stroke  LINES/TUBES: 3/16 ETT >   DISCUSSION: 60 y/o female with 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  Also found to have hepatic steatosis.  On 3/16 she underwent emergent cerebral angiogram and clot retrieval from the R ICA artery and R MCA artery, returned from  IR on the vent  ASSESSMENT / PLAN:  NEUROLOGIC A:   Acute ischemic stroke : R MCA and R ICA occlusion from emboli Subarachnoid hemorrhage on admission, ? From punctate emboli P:   RASS goal: 0 Supportive care per Neurology recs  PULMONARY A: Acute respiratory failure on vent P:   Extubated, push pulm hygiene  CARDIOVASCULAR A:  Acute R MCA ischemic stroke Mildly elevated troponin in setting of stroke Mitral valve regurgitation and bacterial endocarditis P:  BP goal per neurology SBP 120-140 Prn labetalol for hypertension Valve repair under evaluation per TCTS. The fallout from acute CVA will affect plan, timing, etc.   RENAL A: Mild hyponatremia, improving P:   Monitor BMET and UOP Replace electrolytes as needed   GASTROINTESTINAL A:   No acute issues P:   Pepcid for stress ulcer prophylaxis NPO  HEMATOLOGIC A:   No acute issues P:  Monitor for bleeding SCD given subarachnoid hemorrhage and new ischemic stroke  INFECTIOUS A:   Spontaneous bacterial endocarditis with strep viridans P:   Continue ampicillin F/u ID recs  ENDOCRINE A:   Hyperglycemia P:   Target glucose 140-180 with acute ischemic stroke   FAMILY  - Updates: Husband at bedside 3/16 pm  - Inter-disciplinary family meet or Palliative Care meeting due by:  3/23   Summary - appears to be 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  We will assess neurological deficits and decide whether she is a candidate for eventual mitral valve replacement.    Baltazar Apo, MD, PhD 02/09/2016, 9:10 AM [facility] [phone] or if no answer [phone]

## 2016-02-09 NOTE — Progress Notes (Signed)
STROKE TEAM PROGRESS NOTE  HISTORY AT TIME OF ADMISSION Margaret [name] is a 60 y.o. female who presents with headache/vision changes that started today. She had headaches starting Thursday which worsened over the course of the day. Today, she had sudden onset of vision loss with headache and lightheadedness. She has also had some nausea and vomiting.  Ct Head Wo Contrast - localized subarachnoid hemorrhage/debris in the left parietal occipital region.  Of note, she was just discharged from the hospital after an acute splenic infarction without clear etiology or source of embolus. She was on aspirin and Xarelto, aspirin was discontinued at her March 2 clinic visit. At that time, Dr. Alvy Bimler felt that it was most likely related to splenic congestion.   Here, she has low-grade fevers.  LKW: Thursday, 03/09 tpa given?: no, out of window, anticoagulated.    SUBJECTIVE (INTERVAL HISTORY) Patient extubated. Sleeping this morning. Nurse at bedside, per nurse is oriented to person, place, date, situation and following commands, cleared by speech for oral intake.    OBJECTIVE Temp:  [97.8 F (36.6 C)-98.9 F (37.2 C)] 98.6 F (37 C) (03/19 0400) Pulse Rate:  [72-97] 79 (03/19 0700) Cardiac Rhythm:  [-] Normal sinus rhythm (03/18 2300) Resp:  [16-37] 26 (03/19 0700) BP: (103-147)/(48-90) 110/54 mmHg (03/19 0600) SpO2:  [94 %-100 %] 98 % (03/19 0700) FiO2 (%):  [40 %] 40 % (03/18 0800)  CBC:   Recent Labs Lab 02/05/16 0322 02/07/16 0545 02/08/16 0338  WBC 14.2* 24.5* 20.9*  NEUTROABS 10.9*  --   --   HGB 12.9 10.4* 10.9*  HCT 41.5 34.9* 36.4  MCV 88.3 89.3 89.9  PLT 398 437* 454*    Basic Metabolic Panel:   Recent Labs Lab 02/07/16 0545 02/08/16 0338  NA 136 139  K 4.3 3.9  CL 101 98*  CO2 27 28  GLUCOSE 180* 153*  BUN 6 7  CREATININE 0.84 0.88  CALCIUM 10.0 10.4*    Lipid Panel:     Component Value Date/Time   CHOL 155 02/02/2016 1510   TRIG 137 02/02/2016  1510   HDL 21* 02/02/2016 1510   CHOLHDL 7.4 02/02/2016 1510   VLDL 27 02/02/2016 1510   LDLCALC 107* 02/02/2016 1510   HgbA1c:  Lab Results  Component Value Date   HGBA1C 8.4* 02/02/2016   Urine Drug Screen: No results found for: LABOPIA, COCAINSCRNUR, LABBENZ, AMPHETMU, THCU, LABBARB    IMAGING I have personally reviewed the radiological images below and agree with the radiology interpretations.  Dg Chest 2 View 02/01/2016    Cardiomegaly and pulmonary venous congestion.   Ct Head Wo Contrast 02/01/2016   Localized subarachnoid hemorrhage/debris in the left parietal occipital region as discussed above. MRI/MRA recommended.   Mri and Mra Head Wo Contrast 02/01/2016   1. Small volume subarachnoid hemorrhage/debris within the left parieto-occipital region, corresponding to abnormality seen on prior CT. Minimal gyral swelling within this region without significant mass effect or other abnormality.  2. Few sub cm cortical/subcortical ischemic infarcts within the bilateral parietal lobes as above.  3. Negative intracranial MRA.   CTA of head and neck 02/01/2016 1. Mild intracranial and extracranial atherosclerosis without significant stenosis. 2. Patent dural venous sinuses. No vascular malformation identified.  LE venous doppler - Bilateral: No evidence of DVT, superficial thrombosis, or Baker's Cyst.  EEG - Unremarkable awake and drowsy routine inpatient EEG. Clinical correlation is recommended .   TEE - Normal LV size with EF 60-65%. Normal wall motion. Normal  RV size and systolic function. Trivial TR, no TV vegetation. No PV vegetation. Trileaflet aortic valve with no stenosis or regurgitation, no vegetation. There was a mobile vegetation on the posterior leaflet of the mitral valve measuring about 2 cm in greatest dimension. The posterior and anterior leaflets of the MV did not coapt properly and there was very eccentric, anteriorly-directed mitral regurgitation. I  suspect severe MR. Jet was too eccentric for PISA calculations. There was systolic flattening but not flow reversal in the pulmonary vein doppler pattern. The left atrium was mildly dilated, no LA appendage thrombus. Normal right atrium. No evidence for PFO or ASD. Normal caliber aorta with mild plaque in the descending thoracic aorta.  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  Ct Head Wo Contrast 02/07/2016  IMPRESSION: Acute infarct posterior limb internal capsule Small area of subarachnoid hemorrhage right parietal area stable. No new area of hemorrhage. Electronically Signed   By: Franchot Gallo M.D.   On: 02/07/2016 18:44   MRI and MRA  02/07/2016  MRI HEAD 1. Patchy multi focal acute right MCA territory infarcts involving the right frontal, parietal, and temporal lobes as above, with involvement of the posterior limb of the right internal capsule. Scattered petechial hemorrhage about a few of these infarcts, with probable small volume subarachnoid hemorrhage within the right parietal lobe, similar relative prior CTs, but new relative to most recent MRI. 2. Probable new subcentimeter cortical infarct within the left parietal lobe, with interval evolution of previously identified punctate subcortical left parietal infarcts. 3. Small subacute appearing infarct within the left cerebellar hemisphere, new relative to most recent MRI from 02/01/2016. 4. Slight interval increase in prominence of subarachnoid hemorrhage within the left parieto-occipital region. While this finding may be related to technique, the possibility of small amount of re- hemorrhage within this region should be considered. Attention at follow-up CTs recommended.  MRA 1. No large or proximal arterial branch occlusion within the intracranial circulation. 2. Question new 6 mm abnormality at the level of the right MCA bifurcation. Unclear whether  this reflects motion artifact in relation to normal branch vessels, small amount of residual or new thrombus, and less likely new aneurysm (although this is not entirely excluded, with mycotic aneurysm certainly a possibility given the history of endocarditis). Further evaluation with dedicated CTA recommended. (Patient declined CTA.) 3. No other new abnormality within the intracranial circulation.    PHYSICAL EXAM  Temp:  [97.8 F (36.6 C)-98.9 F (37.2 C)] 98.6 F (37 C) (03/19 0400) Pulse Rate:  [72-97] 79 (03/19 0700) Resp:  [16-37] 26 (03/19 0700) BP: (103-147)/(48-90) 110/54 mmHg (03/19 0600) SpO2:  [94 %-100 %] 98 % (03/19 0700) FiO2 (%):  [40 %] 40 % (03/18 0800)  General - morbid obesity, well developed, extubated.  MS: Oriented to self, not year or month but patient sleeping and appears very tired, arouses with voice and then falls back to sleep. Asking for water. Says "I'm cold". Following some simple commands.   Speech: Dysarthric.   Ophthalmologic - Fundi not visualized due to noncooperation  Cardiovascular - Regular rate and rhythm.  Neuro - Patient is sleeping this morning but opens eyes to voice. PEERL. Right gaze preference and does not cross the midline. No blink to threat on the left. Left facial droop. Positive cough and gag. Tongue midline. RUE and RLE spontaneous and purposeful movements. LUE flaccid and LLE withdraw to mild stim.Says "ow" to mild stim x 4.  DTR 1+, left  toe upgoing.Left arm flaccid and no w/d to nox stim.      ASSESSMENT/PLAN Margaret [name] is a 60 y.o. female with history of hypertension, hyperlipidemia, COPD, asthma, diabetes mellitus, migraines, panic attacks, and morbid obesity, presenting with mild headache, visual changes, low-grade fevers, and nausea. She did not receive IV t-PA due to late presentation and anticoagulation.  Acute stroke due to right ICA acute occlusion s/p endovascular thrombectomy - likely septic  emboli  Acute neuro changes 02/06/16 with left hemiplegia, right gaze and left neglect  S/p emergency thrombectomy with TICI 3 recannulization  Intubated and sedated  CT repeat showed only right BG infarct  MRI - Patchy multi focal acute right MCA territory infarcts involving the right frontal, parietal, and temporal lobes   MRA - Question new 6 mm abnormality at the level of the right MCA bifurcation. Dedicated CTA recommended. CTA ordered the patient refused.  No antiplatelet or anticoagulation needed due to endocarditis  Small SAH within the left parieto-occipital region, and 4 punctate infarcts at the watershed area including left MCA/PCA, bilateral MCA/ACA, likely due to endocarditis. No mycotic aneurysm found on CTA. CTV has ruled out CVT.   Resultant asymptomatic  MRI - left parieto-occipital small SAH, punctate infarcts at the watershed area (left MCA/PCA, b/l MCA/ACA)  MRA - negative  CTA head and neck - no cerebral venous thrombosis, AVM, or aneurysm.  2D Echo - EF 60-65% in 12/2015  LE venous doppler - negative for DVT  EEG - negative   TEE - MV endocarditis and MV regurgitation  LDL - 107  HgbA1c 8.4  Hypercoagulable work up positive lupus anticoagulant likely due to Xarelto use PTA. However, ANA and RF positive, need repeat as outpt  VTE prophylaxis - SCDs DIET - DYS 1 Room service appropriate?: Yes; Fluid consistency:: Thin  Xarelto (rivaroxaban) daily prior to admission, now on No antithrombotic secondary to [facility]. Due to endocarditis, no antiplatelet or anticoagulation indicated at this time.  Patient counseled to be compliant with her antithrombotic medications  Ongoing aggressive stroke risk factor management  MV endocarditis with septic emboli - explains spleen infarct, SAH and punctate embolic strokes  TEE showed MV endocarditis and MV regurgitation  CTA head and neck showed no mycotic aneurysm  Blood culture 2/2 strep vividan positive  ID on  board   Elevated WBC  On Rocephin  Repeat BCx NGTD  Consult Dr Roxy Manns - she will eventually need mitral valve repair/replacement if she recovers from neurological standpoint.  Hypertension  Stable mildly low blood pressures  Avoid hypotension  Hyperlipidemia  Home meds:  Lovastatin 20 mg daily resumed in hospital  LDL 107, goal < 70  On pravastatin 20mg  now  Continue statin at discharge  Diabetes  HgbA1c 8.4, goal < 7.0  Uncontrolled  On lantus  SSI  Other Stroke Risk Factors  Advanced age  Cigarette smoker, quit smoking 3 years ago.  Morbid obesity, Body mass index is 45.2 kg/(m^2).   Hx stroke/TIA  Family hx stroke (father)  Migraines  PVD - aortic athrosclerosis  Other Active Problems  Leukocytosis - 14.2 -> 24.5 -> 20.9 (Saturday)  Elevated creatinine 1.10  Mild hyponatremia  Pulmonary HTN  Hilar lymphoadenopathy  Positive ANA and RF, need to repeat as outpt. Rheumatology referral if needed.   Hospital day # 8   This patient is critically ill due to acute ICA occlusion, infarct s/p thrombectomy, endocarditis and at significant risk of neurological worsening, death form recurrent infarct, vessel occlusion, hemorrhagic  infarct, SAH or ICH, sepsis, seizure. This patient's care requires constant monitoring of vital signs, hemodynamics, respiratory and cardiac monitoring, review of multiple databases, neurological assessment, discussion with family, other specialists and medical decision making of high complexity. I spent 45 minutes of neurocritical care time in the care of this patient.   Personally examined patient and images, and have participated in and made any corrections needed to history, physical, neuro exam,assessment and plan as stated above.  I have personally obtained the history, evaluated lab date, reviewed imaging studies and agree with radiology interpretations.    Sarina Ill, MD Neurology [phone] [facility]      To contact Stroke Continuity provider, please refer to [email or web address] After hours, contact General Neurology

## 2016-02-10 LAB — GLUCOSE, CAPILLARY
GLUCOSE-CAPILLARY: 140 mg/dL — AB (ref 65–99)
GLUCOSE-CAPILLARY: 145 mg/dL — AB (ref 65–99)
Glucose-Capillary: 142 mg/dL — ABNORMAL HIGH (ref 65–99)
Glucose-Capillary: 122 mg/dL — ABNORMAL HIGH (ref 65–99)
Glucose-Capillary: 148 mg/dL — ABNORMAL HIGH (ref 65–99)

## 2016-02-10 MED ORDER — SODIUM CHLORIDE 0.9 % IV SOLN
2.0000 g | INTRAVENOUS | Status: DC
  Administered 2016-02-10 – 2016-02-14 (×23): 2 g via INTRAVENOUS
  Filled 2016-02-10 (×31): qty 2000

## 2016-02-10 MED ORDER — BISACODYL 10 MG RE SUPP: 10.0000 mg | Freq: Every day | RECTAL | Status: DC | PRN

## 2016-02-10 MED ORDER — FUROSEMIDE 40 MG PO TABS
40.0000 mg | ORAL_TABLET | Freq: Every day | ORAL | Status: DC
  Administered 2016-02-11 – 2016-02-12 (×2): 40 mg via ORAL
  Filled 2016-02-10 (×2): qty 1

## 2016-02-10 MED ORDER — ISOMETHEPTENE-DICHLORAL-APAP 65-100-325 MG PO CAPS: 1.0000 | ORAL_CAPSULE | Freq: Four times a day (QID) | ORAL | Status: DC | PRN

## 2016-02-10 NOTE — Progress Notes (Signed)
Speech Language Pathology Treatment: Dysphagia  Patient Details Name: Margaret [name] MRN: [id number] DOB: July 17, 1956 Today's Date: 02/10/2016 Time: [id number] SLP Time Calculation (min) (ACUTE ONLY): 13 min  Assessment / Plan / Recommendation Clinical Impression  Treatment focused on diet tolerance and potential to advance. Skilled observation revealed patient able to consume thin liquids and pureed solids without overt indication of aspiration. Intermittent anterior labial spillage of liquids noted due to decreased labial strength, improving with min verbal cueing for tighter seal.Upper dentures placed in preparation for trials of upgraded solid textures and although appearing to fit appropriately, patient with c/o discomfort and asked for removal. Attempted trials of dysphagia 3 textures for potential diet upgrade. Patient became immediately anxious, requesting for oral cavity to be suctioned. SLP assisted with clearance of pos with manual finger sweep, bite of pureed solid, and liquid wash which assisted in clearance of oral cavity. At this time, current diet continues to remain appropriate and patient appears to be tolerating. SLP will f/u.    HPI HPI: Margaret [name] is a 60 y.o. female with PMH includes hypertension, hyperlipidemia, COPD, diabetes mellitus, and CHF. Admitted 3/11 with fever, headache and a small subarachnoid bleed as well as several small "punctate infarcts" consistent with embolic events. on 3/16 Margaret [name] had mechanical fall with normal head CT, afterwards was noted to have left facial droop, slurred speech; angiogram showed R MCA embolism; intubated 3/16 to 3/18. Bedside swallow eval ordered to assess swallow function post-2 day intubation.      SLP Plan  Continue with current plan of care     Recommendations  Diet recommendations: Dysphagia 1 (puree);Thin liquid Liquids provided via: Straw Medication Administration: Crushed with puree Supervision: Patient able to self feed;Full  supervision/cueing for compensatory strategies Compensations: Slow rate;Small sips/bites;Minimize environmental distractions Postural Changes and/or Swallow Maneuvers: Seated upright 90 degrees             General recommendations: Rehab consult Oral Care Recommendations: Oral care BID Follow up Recommendations: Inpatient Rehab Plan: Continue with current plan of care     [place], [place] [phone]   Margaret [name] 02/10/2016, 9:15 AM

## 2016-02-10 NOTE — Evaluation (Signed)
Speech Language Pathology Evaluation Patient Details Name: Margaret [name] MRN: [id number] DOB: 1956/09/23 Today's Date: 02/10/2016 Time: [id number] SLP Time Calculation (min) (ACUTE ONLY): 12 min  Problem List:  Patient Active Problem List   Diagnosis Date Noted  . Right carotid artery occlusion   . Acute respiratory failure with hypoxemia ([place])   . Epigastric abdominal tenderness on direct palpation   . Bacterial endocarditis   . Positive ANA (antinuclear antibody)   . Streptococcus viridans infection 02/04/2016  . Bacteremia   . Cerebrovascular accident (CVA) due to embolism of cerebral artery ([place])   . Subarachnoid hemorrhage ([place]) 02/01/2016  . Leukocytosis 02/01/2016  . Diarrhea 02/01/2016  . Hyponatremia 02/01/2016  . Subarachnoid bleed ([place]) 02/01/2016  . Stroke ([place])   . Visual changes   . Pulmonary infiltrates 01/23/2016  . Pulmonary hypertension assoc with unclear multi-factorial mechanisms ([place]) 01/23/2016  . 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 Insulin dependent diabetes mellitus ([place]) 01/14/2016  . Essential hypertension 01/14/2016  . Hepatic cirrhosis ([place]) 01/14/2016  . Nausea & vomiting 01/14/2016  . Splenic infarct 01/14/2016  . Upper abdominal pain   . COPD (chronic obstructive pulmonary disease) ([place]) 04/27/2012  . Healthcare-associated pneumonia 04/27/2012  . Hemoptysis 04/27/2012  . Chronic diastolic CHF (congestive heart failure) ([place]) 04/27/2012  . Morbid obesity ([place]) 04/27/2012  . Hypoxia 04/27/2012  . Dyspnea on exertion 04/27/2012  . Smoker 04/27/2012  . HTN (hypertension) 04/20/2012  . DM (diabetes mellitus) ([place]) 04/20/2012   Past Medical History:  Past Medical History  Diagnosis Date  . Hypertension   . Hyperlipidemia   . COPD (chronic obstructive pulmonary disease) ([place]) 04/20/12  . Asthma   . Type II diabetes mellitus ([place])   . Anemia   . H/O hiatal hernia   . Osteoarthritis   . Pneumonia 04/2012  . Migraines   . Panic attacks  04/20/12  . Morbid obesity ([place])   . Heart murmur   . Bacterial endocarditis     Strep viridans   . Chronic diastolic CHF (congestive heart failure) ([place]) 04/27/2012  . Pulmonary hypertension assoc with unclear multi-factorial mechanisms ([place]) 01/23/2016  . Positive ANA (antinuclear antibody)   . 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 Stroke ([place])   . Subarachnoid bleed ([place]) 02/01/2016   Past Surgical History:  Past Surgical History  Procedure Laterality Date  . Laceration repair  1966    "right upper arm"  . Dilation and curettage of uterus  1990's  . Finger reattachment  1973    "right ring finger"  . Tee without cardioversion N/A 02/05/2016    Procedure: TRANSESOPHAGEAL ECHOCARDIOGRAM (TEE);  Surgeon: Larey Dresser, MD;  Location: [facility];  Service: Cardiovascular;  Laterality: N/A;  . Radiology with anesthesia N/A 02/06/2016    Procedure: RADIOLOGY WITH ANESTHESIA;  Surgeon: Luanne Bras, MD;  Location: [facility];  Service: Radiology;  Laterality: N/A;   HPI:  Pt is a 60 y.o. female with PMH includes hypertension, hyperlipidemia, COPD, diabetes mellitus, and CHF. Admitted 3/11 with fever, headache and a small subarachnoid bleed as well as several small "punctate infarcts" consistent with embolic events. on 3/16 pt had mechanical fall with normal head CT, afterwards was noted to have left facial droop, slurred speech; angiogram showed R MCA embolism; intubated 3/16 to 3/18. Bedside swallow eval ordered to assess swallow function post-2 day intubation.   Assessment / Plan / Recommendation Clinical Impression  Cognitive-linguistic evaluation complete. Patient presents with moderate dysarthria, improving in intelligibility with use of compensatory strategies (increased intensity,  over-articulate), and cognitive deficits in the areas of sustained attention and problem solving. Additionally, patient with left sided neglect with inability to cross eyes midline impacting overall function with  ADLs. Patient with intact intellectual awareness of most deficits making her an excellent rehab candidate. Recommend acute SLP f/u to address above areas, maximizing independence for decreased burden of care after d/c. CIR consult recommended.     SLP Assessment  Patient needs continued Speech Lanaguage Pathology Services    Follow Up Recommendations  Inpatient Rehab    Frequency and Duration min 2x/week  2 weeks      SLP Evaluation Prior Functioning  Cognitive/Linguistic Baseline: Within functional limits Type of Home: House  Lives With: Spouse Available Help at Discharge: Family;Available PRN/intermittently Vocation: Full time employment   Cognition  Overall Cognitive Status: Impaired/Different from baseline Arousal/Alertness: Lethargic Orientation Level: Oriented X4 Attention: Sustained Focused Attention: Appears intact Sustained Attention: Impaired Sustained Attention Impairment: Verbal complex;Functional basic Memory: Impaired Memory Impairment: Decreased recall of new information Awareness: Appears intact Problem Solving: Impaired Problem Solving Impairment: Functional complex Behaviors: Other (comment) (anxious`) Safety/Judgment: Appears intact    Comprehension  Auditory Comprehension Overall Auditory Comprehension: Appears within functional limits for tasks assessed Visual Recognition/Discrimination Discrimination: Within Function Limits Reading Comprehension Reading Status: Not tested    Expression Expression Primary Mode of Expression: Verbal Verbal Expression Overall Verbal Expression: Appears within functional limits for tasks assessed Written Expression Dominant Hand: Right Written Expression: Not tested   Oral / Motor  Oral Motor/Sensory Function Overall Oral Motor/Sensory Function: Moderate impairment Facial ROM: Reduced left Facial Symmetry: Abnormal symmetry left Facial Strength: Reduced left Facial Sensation: Within Functional Limits Lingual  ROM: Within Functional Limits Lingual Symmetry: Abnormal symmetry left Lingual Strength: Reduced;Suspected CN XII (hypoglossal) dysfunction Lingual Sensation: Within Functional Limits Velum: Within Functional Limits Mandible: Within Functional Limits Motor Speech Overall Motor Speech: Impaired Respiration: Within functional limits Phonation: Normal Resonance: Within functional limits Articulation: Impaired Level of Impairment: Word Intelligibility: Intelligibility reduced Word: 75-100% accurate Phrase: 75-100% accurate Sentence: 75-100% accurate Conversation: 75-100% accurate Motor Planning: Witnin functional limits Motor Speech Errors: Not applicable Effective Techniques: Slow rate;Increased vocal intensity;Over-articulate   GO            Gabriel Rainwater MA, CCC-SLP [phone]         Gabriel Rainwater Meryl 02/10/2016, 9:31 AM

## 2016-02-10 NOTE — Progress Notes (Addendum)
STROKE TEAM PROGRESS NOTE  HISTORY AT TIME OF ADMISSION Margaret [name] is a 60 y.o. female who presents with headache/vision changes that started today. She had headaches starting Thursday which worsened over the course of the day. Today, she had sudden onset of vision loss with headache and lightheadedness. She has also had some nausea and vomiting.  Ct Head Wo Contrast - localized subarachnoid hemorrhage/debris in the left parietal occipital region.  Of note, she was just discharged from the hospital after an acute splenic infarction without clear etiology or source of embolus. She was on aspirin and Xarelto, aspirin was discontinued at her March 2 clinic visit. At that time, Dr. Alvy Bimler felt that it was most likely related to splenic congestion.   Here, she has low-grade fevers.  LKW: Thursday, 03/09 tpa given?: no, out of window, anticoagulated.    SUBJECTIVE (INTERVAL HISTORY) Patient  awake this morning. Nurse at bedside, per nurse is oriented to person, place, date, situation and following commands, cleared by speech for oral intake. Plan to transfer to floor today   OBJECTIVE Temp:  [97.7 F (36.5 C)-98.3 F (36.8 C)] 97.9 F (36.6 C) (03/20 0400) Pulse Rate:  [64-78] 74 (03/20 0800) Cardiac Rhythm:  [-] Normal sinus rhythm (03/20 1129) Resp:  [14-24] 20 (03/20 1540) BP: (104-151)/(46-76) 151/76 mmHg (03/20 1540) SpO2:  [94 %-99 %] 96 % (03/20 0800)  CBC:   Recent Labs Lab 02/05/16 0322  02/08/16 0338 02/09/16 1252  WBC 14.2*  < > 20.9* 16.1*  NEUTROABS 10.9*  --   --  13.0*  HGB 12.9  < > 10.9* 11.0*  HCT 41.5  < > 36.4 37.4  MCV 88.3  < > 89.9 90.3  PLT 398  < > 454* 540*  < > = values in this interval not displayed.  Basic Metabolic Panel:   Recent Labs Lab 02/07/16 0545 02/08/16 0338  NA 136 139  K 4.3 3.9  CL 101 98*  CO2 27 28  GLUCOSE 180* 153*  BUN 6 7  CREATININE 0.84 0.88  CALCIUM 10.0 10.4*    Lipid Panel:     Component Value  Date/Time   CHOL 155 02/02/2016 1510   TRIG 137 02/02/2016 1510   HDL 21* 02/02/2016 1510   CHOLHDL 7.4 02/02/2016 1510   VLDL 27 02/02/2016 1510   LDLCALC 107* 02/02/2016 1510   HgbA1c:  Lab Results  Component Value Date   HGBA1C 8.4* 02/02/2016   Urine Drug Screen: No results found for: LABOPIA, COCAINSCRNUR, LABBENZ, AMPHETMU, THCU, LABBARB    IMAGING I have personally reviewed the radiological images below and agree with the radiology interpretations.  Dg Chest 2 View 02/01/2016    Cardiomegaly and pulmonary venous congestion.   Ct Head Wo Contrast 02/01/2016   Localized subarachnoid hemorrhage/debris in the left parietal occipital region as discussed above. MRI/MRA recommended.   Mri and Mra Head Wo Contrast 02/01/2016   1. Small volume subarachnoid hemorrhage/debris within the left parieto-occipital region, corresponding to abnormality seen on prior CT. Minimal gyral swelling within this region without significant mass effect or other abnormality.  2. Few sub cm cortical/subcortical ischemic infarcts within the bilateral parietal lobes as above.  3. Negative intracranial MRA.   CTA of head and neck 02/01/2016 1. Mild intracranial and extracranial atherosclerosis without significant stenosis. 2. Patent dural venous sinuses. No vascular malformation identified.  LE venous doppler - Bilateral: No evidence of DVT, superficial thrombosis, or Baker's Cyst.  EEG - Unremarkable awake and drowsy routine  inpatient EEG. Clinical correlation is recommended .   TEE - Normal LV size with EF 60-65%. Normal wall motion. Normal RV size and systolic function. Trivial TR, no TV vegetation. No PV vegetation. Trileaflet aortic valve with no stenosis or regurgitation, no vegetation. There was a mobile vegetation on the posterior leaflet of the mitral valve measuring about 2 cm in greatest dimension. The posterior and anterior leaflets of the MV did not coapt properly and there was very  eccentric, anteriorly-directed mitral regurgitation. I suspect severe MR. Jet was too eccentric for PISA calculations. There was systolic flattening but not flow reversal in the pulmonary vein doppler pattern. The left atrium was mildly dilated, no LA appendage thrombus. Normal right atrium. No evidence for PFO or ASD. Normal caliber aorta with mild plaque in the descending thoracic aorta.  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  Ct Head Wo Contrast 02/07/2016  IMPRESSION: Acute infarct posterior limb internal capsule Small area of subarachnoid hemorrhage right parietal area stable. No new area of hemorrhage. Electronically Signed   By: Franchot Gallo M.D.   On: 02/07/2016 18:44   MRI and MRA  02/07/2016  MRI HEAD 1. Patchy multi focal acute right MCA territory infarcts involving the right frontal, parietal, and temporal lobes as above, with involvement of the posterior limb of the right internal capsule. Scattered petechial hemorrhage about a few of these infarcts, with probable small volume subarachnoid hemorrhage within the right parietal lobe, similar relative prior CTs, but new relative to most recent MRI. 2. Probable new subcentimeter cortical infarct within the left parietal lobe, with interval evolution of previously identified punctate subcortical left parietal infarcts. 3. Small subacute appearing infarct within the left cerebellar hemisphere, new relative to most recent MRI from 02/01/2016. 4. Slight interval increase in prominence of subarachnoid hemorrhage within the left parieto-occipital region. While this finding may be related to technique, the possibility of small amount of re- hemorrhage within this region should be considered. Attention at follow-up CTs recommended.  MRA 1. No large or proximal arterial branch occlusion within the intracranial circulation. 2. Question new 6 mm abnormality at the  level of the right MCA bifurcation. Unclear whether this reflects motion artifact in relation to normal branch vessels, small amount of residual or new thrombus, and less likely new aneurysm (although this is not entirely excluded, with mycotic aneurysm certainly a possibility given the history of endocarditis). Further evaluation with dedicated CTA recommended. (Patient declined CTA.) 3. No other new abnormality within the intracranial circulation.  02/06/2016 : S/P Complete revascularization of occluded RT ICA terminus and RT MCA prox with x2 passes with Solitaire FR 96mm x 50mm retrieval device and x1 pass with solitaire FR 64mmx 30 mm retrieval device and 5.5 mg of superselective intraarterial integrelin achieving a TICI 3 revascularization   PHYSICAL EXAM  Temp:  [97.7 F (36.5 C)-98.3 F (36.8 C)] 97.9 F (36.6 C) (03/20 0400) Pulse Rate:  [64-78] 74 (03/20 0800) Resp:  [14-24] 20 (03/20 1540) BP: (104-151)/(46-76) 151/76 mmHg (03/20 1540) SpO2:  [94 %-99 %] 96 % (03/20 0800)  General - morbid obese middle aged Caucasian lady, well developed,    MS: Oriented to self, not year or month but patient sleeping and appears very tired, arouses with voice and then falls back to sleep. Asking for water. Says "I'm cold". Following some simple commands.   Speech: Dysarthric but can be understood easily.   Ophthalmologic - Fundi not visualized due to noncooperation  Cardiovascular - Regular rate and rhythm.  Neuro - Patient is awake but disoriented. PEERL. Right gaze preference and does not cross the midline. No blink to threat on the left. Left facial droop. Positive cough and gag. Tongue midline. RUE and RLE spontaneous and purposeful movements. LUE flaccid and LLE withdraw to mild stim.Says "ow" to mild stim x 4.  DTR 1+, left toe upgoing.Left arm flaccid and no w/d to nox stim.      ASSESSMENT/PLAN Ms. TRISCHA CHABAN is a 60 y.o. female with history of hypertension,  hyperlipidemia, COPD, asthma, diabetes mellitus, migraines, panic attacks, and morbid obesity, presenting with mild headache, visual changes, low-grade fevers, and nausea. She did not receive IV t-PA due to late presentation and anticoagulation.  Acute stroke due to right ICA acute occlusion s/p endovascular thrombectomy of terminal RICA occlusion - likely septic emboli  Acute neuro changes 02/06/16 with left hemiplegia, right gaze and left neglect  S/p emergency thrombectomy with TICI 3 recannulization  Intubated and sedated  CT repeat showed only right BG infarct  MRI - Patchy multi focal acute right MCA territory infarcts involving the right frontal, parietal, and temporal lobes   MRA - Question new 6 mm abnormality at the level of the right MCA bifurcation. Dedicated CTA recommended. CTA ordered the patient refused.  No antiplatelet or anticoagulation needed due to endocarditis  Small SAH within the left parieto-occipital region, and 4 punctate infarcts at the watershed area including left MCA/PCA, bilateral MCA/ACA, likely due to endocarditis. No mycotic aneurysm found on CTA. CTV has ruled out CVT.   Resultant asymptomatic  MRI - left parieto-occipital small SAH, punctate infarcts at the watershed area (left MCA/PCA, b/l MCA/ACA)  MRA - negative  CTA head and neck - no cerebral venous thrombosis, AVM, or aneurysm.  2D Echo - EF 60-65% in 12/2015  LE venous doppler - negative for DVT  EEG - negative   TEE - MV endocarditis and MV regurgitation  LDL - 107  HgbA1c 8.4  Hypercoagulable work up positive lupus anticoagulant likely due to Xarelto use PTA. However, ANA and RF positive, need repeat as outpt  VTE prophylaxis - SCDs DIET - DYS 1 Room service appropriate?: Yes; Fluid consistency:: Thin  Xarelto (rivaroxaban) daily prior to admission, now on No antithrombotic secondary to [facility]. Due to endocarditis, no antiplatelet or anticoagulation indicated at this  time.  Patient counseled to be compliant with her antithrombotic medications  Ongoing aggressive stroke risk factor management  MV endocarditis with septic emboli - explains spleen infarct, SAH and punctate embolic strokes  TEE showed MV endocarditis and MV regurgitation  CTA head and neck showed no mycotic aneurysm  Blood culture 2/2 strep vividan positive  ID on board   Elevated WBC  On Rocephin  Repeat BCx NGTD  Consult Dr Roxy Manns - she will eventually need mitral valve repair/replacement if she recovers from neurological standpoint.  Hypertension  Stable mildly low blood pressures  Avoid hypotension  Hyperlipidemia  Home meds:  Lovastatin 20 mg daily resumed in hospital  LDL 107, goal < 70  On pravastatin 20mg  now  Continue statin at discharge  Diabetes  HgbA1c 8.4, goal < 7.0  Uncontrolled  On lantus  SSI  Other Stroke Risk Factors  Advanced age  Cigarette smoker, quit smoking 3 years ago.  Morbid obesity, Body mass index is 45.2 kg/(m^2).   Hx stroke/TIA  Family hx stroke (father)  Migraines  PVD - aortic athrosclerosis  Other Active Problems  Leukocytosis -  14.2 -> 24.5 -> 20.9 (Saturday)  Elevated creatinine 1.10  Mild hyponatremia  Pulmonary HTN  Hilar lymphoadenopathy  Positive ANA and RF, need to repeat as outpt. Rheumatology referral if needed.   Hospital day # 9   This patient is critically ill due to acute ICA occlusion, infarct s/p thrombectomy, endocarditis and at significant risk of neurological worsening, death form recurrent infarct, vessel occlusion, hemorrhagic infarct, SAH or ICH, sepsis, seizure. This patient's care requires constant monitoring of vital signs, hemodynamics, respiratory and cardiac monitoring, review of multiple databases, neurological assessment, discussion with family, other specialists and medical decision making of high complexity. I spent 30 minutes of neurocritical care time in the care of  this patient. Agree with transfer to the floor. She will need cardiac surgery for mitral valve replacement eventually. Continue antibiotics as per ID.   Personally examined patient and images, and have participated in and made any corrections needed to history, physical, neuro exam,assessment and plan as stated above.  I have personally obtained the history, evaluated lab date, reviewed imaging studies and agree with radiology interpretations.    Antony Contras, MD Neurology [phone] [facility]      To contact Stroke Continuity provider, please refer to [email or web address] After hours, contact General Neurology

## 2016-02-10 NOTE — Evaluation (Signed)
Physical Therapy Re-Evaluation Patient Details Name: Margaret [name] MRN: [id number] DOB: October 01, 1956 Today's Date: 02/10/2016   History of Present Illness  pt initiall presented with L Parieto-Occipital small SAH and at least 4 punctate watershed infarcts.  On 3/16 pt sustained a fall with initial f/u CT negative, but then began to have L sided deficits and stat CT revealed new R MCA Infarct and pt is now s/p R MCA and R ICA Revascularization and clot retrieval.  pt intubated 3/16-3/18.  pt with hx of HTN, COPD, Asthma, DM, Anemia, Hiatal Hernia, Migraines, Panic Attacks, CHF, Pulmonary HTN, Splenic Infarct, SAH, and CVA.    Clinical Impression  Pt now presents with extensive L sided weakness and requires A for all aspects of mobility.  Pt does participate well and seem very motivated to improve overall mobility and feel pt would benefit from CIR level of therapies at D/C to decrease burden of care.  Will continue to follow.      Follow Up Recommendations CIR    Equipment Recommendations  None recommended by PT    Recommendations for Other Services Rehab consult     Precautions / Restrictions Precautions Precautions: Fall Restrictions Weight Bearing Restrictions: No      Mobility  Bed Mobility Overal bed mobility: Needs Assistance;+2 for physical assistance Bed Mobility: Rolling;Sidelying to Sit;Sit to Supine Rolling: Max assist;+2 for physical assistance Sidelying to sit: Max assist;+2 for physical assistance;HOB elevated   Sit to supine: Max assist;+2 for physical assistance   General bed mobility comments: pt does attempt to A with R UE and LE during bed mobility, but does require extensive 2 person A.    Transfers                    Ambulation/Gait                Stairs            Wheelchair Mobility    Modified Rankin (Stroke Patients Only) Modified Rankin (Stroke Patients Only) Pre-Morbid Rankin Score: No symptoms Modified Rankin:  Severe disability     Balance Overall balance assessment: Needs assistance Sitting-balance support: Single extremity supported;Feet supported Sitting balance-Leahy Scale: Poor Sitting balance - Comments: pt tends to lean to L side, but with cueing does attempt to A with correcting balance.                                       Pertinent Vitals/Pain Pain Assessment: Faces Faces Pain Scale: Hurts even more Pain Location: Headache Pain Descriptors / Indicators: Headache Pain Intervention(s): Monitored during session;Repositioned    Home Living Family/patient expects to be discharged to:: Inpatient rehab                      Prior Function Level of Independence: Independent               Hand Dominance   Dominant Hand: Right    Extremity/Trunk Assessment   Upper Extremity Assessment: Defer to OT evaluation           Lower Extremity Assessment: LLE deficits/detail   LLE Deficits / Details: Very minimal hip hike noted, otherwise no active movement noted.  Sensation appears intact.    Cervical / Trunk Assessment: Normal  Communication   Communication: Expressive difficulties  Cognition Arousal/Alertness: Awake/alert Behavior During Therapy: WFL for tasks assessed/performed Overall Cognitive Status:  Impaired/Different from baseline Area of Impairment: Attention;Following commands;Safety/judgement;Awareness;Problem solving;Memory   Current Attention Level: Selective Memory: Decreased short-term memory Following Commands: Follows one step commands with increased time Safety/Judgement: Decreased awareness of safety;Decreased awareness of deficits Awareness: Emergent Problem Solving: Slow processing;Decreased initiation;Difficulty sequencing;Requires verbal cues;Requires tactile cues      General Comments      Exercises        Assessment/Plan    PT Assessment Patient needs continued PT services  PT Diagnosis Difficulty  walking;Hemiplegia non-dominant side   PT Problem List Decreased strength;Decreased activity tolerance;Decreased balance;Decreased mobility;Decreased coordination;Decreased cognition;Decreased knowledge of use of DME;Decreased safety awareness;Obesity;Pain  PT Treatment Interventions DME instruction;Functional mobility training;Therapeutic activities;Balance training;Therapeutic exercise;Neuromuscular re-education;Cognitive remediation;Patient/family education   PT Goals (Current goals can be found in the Care Plan section) Acute Rehab PT Goals Patient Stated Goal: Get better. PT Goal Formulation: With patient Time For Goal Achievement: 02/24/16 Potential to Achieve Goals: Good    Frequency Min 4X/week   Barriers to discharge        Co-evaluation PT/OT/SLP Co-Evaluation/Treatment: Yes Reason for Co-Treatment: Complexity of the patient's impairments (multi-system involvement);For patient/therapist safety PT goals addressed during session: Mobility/safety with mobility;Balance         End of Session   Activity Tolerance: Patient limited by fatigue Patient left: in bed;with call bell/phone within reach Nurse Communication: Mobility status         Time: 1025-1055 PT Time Calculation (min) (ACUTE ONLY): 30 min   Charges:   PT Evaluation $PT Re-evaluation: 1 Procedure     PT G CodesCatarina Hartshorn, [place] 02/10/2016, 2:28 PM

## 2016-02-10 NOTE — Progress Notes (Addendum)
Margaret [name] for Infectious Disease   Reason for visit: Follow up on TV endocarditis  Interval History: now extubated. WBC down to 16.  Evaluated for rehab. Asks about picc. Being transferred out of ICU.   Physical Exam: Constitutional:  Filed Vitals:   02/10/16 0700 02/10/16 0800  BP:  136/62  Pulse: 72 74  Temp:    Resp: 21 14   patient appears in NAD; alert, slowed and can only say one word at a time Respiratory: Normal respiratory effort; CTA B Cardiovascular: RRR GI: soft, nt  Review of Systems: Unable to be assessed due to mental status  Lab Results  Component Value Date   WBC 16.1* 02/09/2016   HGB 11.0* 02/09/2016   HCT 37.4 02/09/2016   MCV 90.3 02/09/2016   PLT 540* 02/09/2016    Lab Results  Component Value Date   CREATININE 0.88 02/08/2016   BUN 7 02/08/2016   NA 139 02/08/2016   K 3.9 02/08/2016   CL 98* 02/08/2016   CO2 28 02/08/2016    Lab Results  Component Value Date   ALT 10* 02/07/2016   AST 11* 02/07/2016   ALKPHOS 67 02/07/2016     Microbiology: Recent Results (from the past 240 hour(s))  Culture, blood (Routine X 2) w Reflex to ID Panel     Status: None   Collection Time: 02/01/16  7:45 AM  Result Value Ref Range Status   Specimen Description BLOOD RIGHT ARM  Final   Special Requests BOTTLES DRAWN AEROBIC AND ANAEROBIC 10CC  Final   Culture  Setup Time   Final    GRAM POSITIVE COCCOBACILLUS IN BOTH AEROBIC AND ANAEROBIC BOTTLES CRITICAL RESULT CALLED TO, READ BACK BY AND VERIFIED WITH: C SOSA 02/02/16 @ 1014 M VESTAL    Culture   Final    VIRIDANS STREPTOCOCCUS SUSCEPTIBILITIES PERFORMED ON PREVIOUS CULTURE WITHIN THE LAST 5 DAYS.    Report Status 02/04/2016 FINAL  Final  Culture, blood (Routine X 2) w Reflex to ID Panel     Status: None   Collection Time: 02/01/16  8:00 AM  Result Value Ref Range Status   Specimen Description BLOOD LEFT HAND  Final   Special Requests BOTTLES DRAWN AEROBIC AND ANAEROBIC 10CC  Final   Culture  Setup Time   Final    GRAM POSITIVE COCCOBACILLUS IN BOTH AEROBIC AND ANAEROBIC BOTTLES CRITICAL RESULT CALLED TO, READ BACK BY AND VERIFIED WITH: C SOSA 02/02/16 @ 1014 M VESTAL    Culture VIRIDANS STREPTOCOCCUS  Final   Report Status 02/04/2016 FINAL  Final   Organism ID, Bacteria VIRIDANS STREPTOCOCCUS  Final      Susceptibility   Viridans streptococcus - MIC*    PENICILLIN <=0.06 SENSITIVE Sensitive     CEFTRIAXONE <=0.12 SENSITIVE Sensitive     ERYTHROMYCIN <=0.12 SENSITIVE Sensitive     LEVOFLOXACIN 0.5 SENSITIVE Sensitive     VANCOMYCIN 0.5 SENSITIVE Sensitive     * VIRIDANS STREPTOCOCCUS  MRSA PCR Screening     Status: None   Collection Time: 02/01/16 12:54 PM  Result Value Ref Range Status   MRSA by PCR NEGATIVE NEGATIVE Final    Comment:        The GeneXpert MRSA Assay (FDA approved for NASAL specimens only), is one component of a comprehensive MRSA colonization surveillance program. It is not intended to diagnose MRSA infection nor to guide or monitor treatment for MRSA infections.   Culture, blood (routine x 2)     Status:  None   Collection Time: 02/04/16 12:36 PM  Result Value Ref Range Status   Specimen Description BLOOD  Final   Special Requests IN PEDIATRIC BOTTLE 3CC  Final   Culture NO GROWTH 5 DAYS  Final   Report Status 02/09/2016 FINAL  Final  Culture, blood (routine x 2)     Status: None   Collection Time: 02/04/16 12:48 PM  Result Value Ref Range Status   Specimen Description BLOOD  Final   Special Requests IN PEDIATRIC BOTTLE 3CC  Final   Culture NO GROWTH 5 DAYS  Final   Report Status 02/09/2016 FINAL  Final    Impression/Plan:  1. Strep viridans native mitral valve endocarditis - also with septic emboli to spleen,  CNS, possible SAH.  On ampicillin.  Followed by CT surgery and may need valve replacement. Treat through April 24th.    I will put in a picc line order.   2. Leukocytosis - improving  3. Respiratory failure - now off  of vent.  On nasal cannula.

## 2016-02-10 NOTE — Progress Notes (Signed)
PULMONARY / CRITICAL CARE MEDICINE   Name: Margaret [name] MRN: [id number] DOB: February 03, 1956    ADMISSION DATE:  02/01/2016 CONSULTATION DATE:  3/16  REFERRING MD:  TRH  CHIEF COMPLAINT:  CVA s/p neuro IR  HISTORY OF PRESENT ILLNESS:  60 year old female with past medical history of hypertension, hyperlipidemia, COPD, diabetes mellitus, and CHF. She recently admitted in February of this year for acute splenic infarction without clear etiology, however at that time she was also diagnosed with hepatic cirrhosis and he was initially thought this was the cause of the infarct as echocardiogram the time showed no cardiac source of emboli. She was started on anticoagulation and was slated to obtain outpatient TEE.  She returned to the hospital on 3/11 with fever, headache and a small subarachnoid bleed as well as several small "punctate infarcts" consistent with embolic events.  Blood cultures were positive for strep viridans at that point.  TEE showed a 2cm vegetation on the mitral valve with severe mitral regurgitation.  Cardiothoracic surgery was consulted on 3/16.    On 3/16 she had a mechanical fall and had a CT head that was read as normal around 1400.  Apparently she was given ativan.  After return from CT imaging she was noted to have left facial droop, slurred speech around 4PM.  Stroke was called, CT head repeated, then IR was consulted for possible cerebral angiogram.  She underwent an angiogram showing a R MCA embolism and occluded right internal carotid artery embolism.  These were retrieved and then she underwent selective intra-arterial integrilin infusion.    She returned from the procedure intubated and hypertensive  SUBJECTIVE:  Afebrile Denies pain Good UO   VITAL SIGNS: BP 136/62 mmHg  Pulse 74  Temp(Src) 97.9 F (36.6 C) (Oral)  Resp 14  Ht 5\' 5"  (1.651 m)  Wt 271 lb 9.7 oz (123.2 kg)  BMI 45.20 kg/m2  SpO2 96%  HEMODYNAMICS:    VENTILATOR SETTINGS:    INTAKE  / OUTPUT: I/O last 3 completed shifts: In: 2683 [P.O.:325; I.V.:1758; IV Piggyback:600] Out: [id number] [Urine:3440]  PHYSICAL EXAMINATION: Gen. Obese, comfortable in bed ENT -  no post nasal drip, some dysarthria,  Neck: No JVD, no stridor Lungs: no use of accessory muscles,  Cardiovascular: Rhythm regular, heart sounds  normal, no murmurs, 1+ peripheral edema Abdomen: soft and non-tender, no hepatosplenomegaly, BS normal. Musculoskeletal: No deformities, no cyanosis or clubbing Neuro:  Left weakness Skin:  Warm, no lesions/ rash  LABS:  BMET  Recent Labs Lab 02/05/16 0322 02/07/16 0545 02/08/16 0338  NA 134* 136 139  K 4.3 4.3 3.9  CL 99* 101 98*  CO2 25 27 28   BUN 10 6 7   CREATININE 0.91 0.84 0.88  GLUCOSE 204* 180* 153*    Electrolytes  Recent Labs Lab 02/05/16 0322 02/07/16 0545 02/08/16 0338  CALCIUM 10.2 10.0 10.4*    CBC  Recent Labs Lab 02/07/16 0545 02/08/16 0338 02/09/16 1252  WBC 24.5* 20.9* 16.1*  HGB 10.4* 10.9* 11.0*  HCT 34.9* 36.4 37.4  PLT 437* 454* 540*    Coag's  Recent Labs Lab 02/07/16 0545  APTT 27  INR 1.32    Sepsis Markers No results for input(s): LATICACIDVEN, PROCALCITON, O2SATVEN in the last 168 hours.  ABG  Recent Labs Lab 02/06/16 1700 02/07/16 0003  PHART 7.430 7.310*  PCO2ART 43.6 59.3*  PO2ART 84.5 379.0*    Liver Enzymes  Recent Labs Lab 02/05/16 0322 02/07/16 0545  AST 15 11*  ALT 14  10*  ALKPHOS 76 67  BILITOT 0.7 0.2*  ALBUMIN 2.8* 2.3*    Cardiac Enzymes  Recent Labs Lab 02/05/16 0322 02/05/16 0802 02/05/16 1448  TROPONINI 0.04* 0.04* 0.05*    Glucose  Recent Labs Lab 02/08/16 1723 02/08/16 2153 02/09/16 0748 02/09/16 1122 02/09/16 1719 02/09/16 2351  GLUCAP 134* 137* 129* 140* 145* 140*    Imaging No results found.   STUDIES:  3/11 CT head > localized subarachnoid hemorrhage/debris in left parietal occipital region 3/11 CT angiogram> mild intracranial and  extracranial atherosclerosis without stenosis, patent dural venous sinuses, no vascular malformation 3/11 MRI/MRA brain > small volume subarachnoid hemorrhage/debri left parieto-occipital region, few sub cm cortical subcortical ischemic infarcts in bilateral parietal lobes, negative MRA 3/14 EEG > no evidence of epileptiform discharges 3/15 TEE> 2 cm mitral valve vegetation with regurgitation 3/15 CT ab/pelvis> new splenic infarct, larger than before, enlarged spleen, diffuse hepatic steatosis, mosaic attenuation of lung bases, consider pulmonary arterial hypertension 3/16 CT head > No significant change from prior 3/16 CT head> decreased conspicuity of left parieto-occipital subarachnoid hemorrhage; ? New area of hemorrhage R parietal sulcus 3/16 IR angiogram> R internal carotid and R MCA occlusion 3/16 CT head > unchanged small volume R parietal and left parieto occipital subarachnoid hemorrhage 3/17 Head Ct > interval decrease small vol R parietal and L parietal-occipital bleed, no new bleed, no acute infarct yet 3/18 MRI brain >> patchy multifocal right MCA infarct, scattered parenchymal hemorrhage, probable new left parietal cortical infarct, subacute left cerebellar hemisphere CVA, slight interval increase in prominence of subarachnoid hemorrhage in the left parieto-occipital region  CULTURES: 3/11 blood > viridans strep 3/14 blood > negative  ANTIBIOTICS: 3/12  Ceftriaxone > 3/16 3/16 Ampicillin >   SIGNIFICANT EVENTS: 3/11 admission 3/16 acute ischemic stroke  LINES/TUBES: 3/16 ETT > 3/18  DISCUSSION: 60 y/o female with 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  Also found to have hepatic steatosis.  On 3/16 she underwent emergent cerebral angiogram and clot retrieval from the R ICA artery and R MCA artery, returned from IR on the vent  ASSESSMENT /  PLAN:  NEUROLOGIC A:   Acute ischemic stroke : R MCA and R ICA occlusion from emboli Subarachnoid hemorrhage on admission, ? From punctate emboli P:   Supportive care per Neurology recs Needs rehab  PULMONARY A: Acute respiratory failure on vent P:   Extubated, push pulm hygiene  CARDIOVASCULAR A:  Acute R MCA ischemic stroke Mildly elevated troponin in setting of stroke Mitral valve regurgitation and bacterial endocarditis P:  BP goal per neurology SBP 120-140 Prn labetalol for hypertension Valve repair under evaluation per TCTS. The fallout from acute CVA will affect plan, timing, etc.   RENAL A: Mild hyponatremia-resolved P:   Monitor BMET and UOP Replace electrolytes as needed   GASTROINTESTINAL A:   dysphagia P:   Pepcid for stress ulcer prophylaxis dys 1 diet  HEMATOLOGIC A:   No acute issues P:  Monitor for bleeding SCD given subarachnoid hemorrhage and new ischemic stroke  INFECTIOUS A:   Spontaneous bacterial endocarditis with strep viridans P:   Continue ampicillin - WBC decreasing F/u ID recs  ENDOCRINE A:   Hyperglycemia P:   Target glucose 140-180 with acute ischemic stroke   FAMILY  - Updates: Husband at bedside 3/16 pm  - Inter-disciplinary family meet or Palliative Care meeting due by: NA   Summary -  appears to be 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  TCTS following tosee  whether she is a candidate for eventual mitral valve replacement.  Can transfer to tele & to triad 3/21  Kara Mead MD. [facility].  Pulmonary & Critical care Pager [phone] If no response call [phone]   02/10/2016     02/10/2016, 9:11 AM

## 2016-02-10 NOTE — Progress Notes (Signed)
Rehab Admissions Coordinator Note:  Patient was screened by Cleatrice Burke for appropriateness for an Inpatient Acute Rehab Consult. Per PT and OT recommendations. At this time, we are recommending Inpatient Rehab consult.  Cleatrice Burke 02/10/2016, 5:31 PM  I can be reached at [phone].

## 2016-02-10 NOTE — Evaluation (Signed)
Occupational Therapy Re-Evaluation Patient Details Name: Margaret [name] MRN: [id number] DOB: Jun 30, 1956 Today's Date: 02/10/2016    History of Present Illness pt initiall presented with L Parieto-Occipital small SAH and at least 4 punctate watershed infarcts.  On 3/16 pt sustained a fall with initial f/u CT negative, but then began to have L sided deficits and stat CT revealed new R MCA Infarct and pt is now s/p R MCA and R ICA Revascularization and clot retrieval.  pt intubated 3/16-3/18.  pt with hx of HTN, COPD, Asthma, DM, Anemia, Hiatal Hernia, Migraines, Panic Attacks, CHF, Pulmonary HTN, Splenic Infarct, SAH, and CVA.     Clinical Impression   Pt now with new Rt MCA infarct.  She presents to OT with Lt hemiplegia, impaired balance, cognitive and visual deficits including Lt inattention.  She currently requries max A for ADLs. She will benefit from continued OT to maximize independence with ADLs.  Goals updated.  Recommend CIR.     Follow Up Recommendations  CIR;Supervision/Assistance - 24 hour    Equipment Recommendations  3 in 1 bedside comode;Tub/shower bench    Recommendations for Other Services Rehab consult     Precautions / Restrictions Precautions Precautions: Fall Restrictions Weight Bearing Restrictions: No      Mobility Bed Mobility Overal bed mobility: Needs Assistance;+2 for physical assistance Bed Mobility: Rolling;Sidelying to Sit;Sit to Supine Rolling: Max assist;+2 for physical assistance Sidelying to sit: Max assist;+2 for physical assistance;HOB elevated   Sit to supine: Max assist;+2 for physical assistance   General bed mobility comments: pt does attempt to A with R UE and LE during bed mobility, but does require extensive 2 person A.    Transfers                 General transfer comment: did not attempt this date     Balance Overall balance assessment: Needs assistance Sitting-balance support: Feet supported Sitting  balance-Leahy Scale: Poor Sitting balance - Comments: pt tends to lean to L side, but with cueing does attempt to A with correcting balance.                                      ADL Overall ADL's : Needs assistance/impaired Eating/Feeding: Minimal assistance;Bed level   Grooming: Wash/dry hands;Wash/dry face;Oral care;Brushing hair;Moderate assistance;Bed level   Upper Body Bathing: Maximal assistance;Bed level   Lower Body Bathing: Maximal assistance;Bed level   Upper Body Dressing : Maximal assistance;Bed level;Sitting   Lower Body Dressing: Total assistance;Bed level   Toilet Transfer: Total assistance Toilet Transfer Details (indicate cue type and reason): unable to safely assist this date  Toileting- Water quality scientist and Hygiene: Total assistance;Bed level Toileting - Clothing Manipulation Details (indicate cue type and reason): Pt assisted with bedpan use      Functional mobility during ADLs: Maximal assistance;+2 for physical assistance General ADL Comments: Pt quickly self distracts when engaged in an activity      Vision Vision Assessment?: Yes Eye Alignment: Impaired (comment) Tracking/Visual Pursuits: Decreased smoothness of horizontal tracking;Decreased smoothness of vertical tracking Additional Comments: Pt with Rt gaze preference.  She will track to slightly left on midline with max cues.  Pt with what appears to be dysconjugate gaze, but difficult to accurately assess due to severity of gaze preference.  Pt reports blurriness of vision, denies diplopia    Perception Perception Perception Tested?: Yes Perception Deficits: Inattention/neglect Inattention/Neglect: Does not attend  to left visual field;Does not attend to left side of body   Praxis Praxis Praxis tested?: Within functional limits    Pertinent Vitals/Pain Pain Assessment: Faces Faces Pain Scale: Hurts even more Pain Location: headache Pain Descriptors / Indicators:  Headache Pain Intervention(s): Monitored during session     Hand Dominance Right   Extremity/Trunk Assessment Upper Extremity Assessment Upper Extremity Assessment: LUE deficits/detail LUE Deficits / Details: Lt UE appears flaccid/no AROM noted  LUE Sensation: decreased proprioception LUE Coordination: decreased fine motor;decreased gross motor   Lower Extremity Assessment Lower Extremity Assessment: Defer to PT evaluation LLE Deficits / Details: Very minimal hip hike noted, otherwise no active movement noted.  Sensation appears intact.   LLE Coordination: decreased fine motor;decreased gross motor   Cervical / Trunk Assessment Cervical / Trunk Assessment: Other exceptions Cervical / Trunk Exceptions: Pt with passive Lt lateral flexion.  Poor trunk control    Communication Communication Communication: Expressive difficulties   Cognition Arousal/Alertness: Awake/alert Behavior During Therapy: WFL for tasks assessed/performed Overall Cognitive Status: Impaired/Different from baseline Area of Impairment: Attention;Following commands;Safety/judgement;Awareness;Problem solving;Memory   Current Attention Level: Selective Memory: Decreased short-term memory Following Commands: Follows one step commands with increased time Safety/Judgement: Decreased awareness of safety;Decreased awareness of deficits Awareness: Emergent Problem Solving: Slow processing;Decreased initiation;Difficulty sequencing;Requires verbal cues;Requires tactile cues General Comments: Pt self distracts with discomfort and anxiety due to her spouse's absence    General Comments       Exercises       Shoulder Instructions      Home Living Family/patient expects to be discharged to:: Inpatient rehab Living Arrangements: Spouse/significant other Available Help at Discharge: Family;Available PRN/intermittently Type of Home: House Home Access: Stairs to enter [employer] of Steps: 3 Entrance  Stairs-Rails: Right Home Layout: One level     Bathroom Shower/Tub: Tub/shower unit                    Prior Functioning/Environment Level of Independence: Independent             OT Diagnosis: Generalized weakness;Cognitive deficits;Disturbance of vision;Acute pain;Hemiplegia dominant side   OT Problem List: Decreased strength;Decreased range of motion;Decreased activity tolerance;Impaired balance (sitting and/or standing);Impaired vision/perception;Decreased coordination;Decreased cognition;Decreased knowledge of use of DME or AE;Decreased safety awareness;Decreased knowledge of precautions;Impaired sensation;Impaired tone;Obesity;Impaired UE functional use;Pain   OT Treatment/Interventions: Self-care/ADL training;Therapeutic exercise;Neuromuscular education;DME and/or AE instruction;Modalities;Therapeutic activities;Cognitive remediation/compensation;Visual/perceptual remediation/compensation;Patient/family education;Balance training    OT Goals(Current goals can be found in the care plan section) Acute Rehab OT Goals Patient Stated Goal: to go home  OT Goal Formulation: With patient Time For Goal Achievement: 02/24/16 Potential to Achieve Goals: Good ADL Goals Pt Will Perform Eating: with supervision;sitting Pt Will Perform Grooming: with supervision;sitting Pt Will Perform Upper Body Bathing: with mod assist;sitting Pt Will Perform Upper Body Dressing: with max assist;sitting Pt Will Transfer to Toilet: with mod assist;squat pivot transfer;bedside commode Additional ADL Goal #1: Family will be independent with PROM Lt UE Additional ADL Goal #2: Pt will locate ADL items on Lt with min cues   OT Frequency: Min 3X/week   Barriers to D/C:            Co-evaluation PT/OT/SLP Co-Evaluation/Treatment: Yes Reason for Co-Treatment: Complexity of the patient's impairments (multi-system involvement);For patient/therapist safety PT goals addressed during session:  Mobility/safety with mobility;Balance OT goals addressed during session: ADL's and self-care;Strengthening/ROM      End of Session Nurse Communication: Mobility status  Activity Tolerance: Other (comment) (impaired attention) Patient left: in bed;with  call bell/phone within reach   Time: 1027-1055 OT Time Calculation (min): 28 min Charges:  OT General Charges $OT Visit: 1 Procedure OT Evaluation $OT Re-eval: 1 Procedure G-Codes:    Lucille Passy M 2016-02-23, 2:59 PM

## 2016-02-11 ENCOUNTER — Inpatient Hospital Stay: Payer: Self-pay | Admitting: Internal Medicine

## 2016-02-11 DIAGNOSIS — J42 Unspecified chronic bronchitis: Secondary | ICD-10-CM

## 2016-02-11 DIAGNOSIS — Z794 Long term (current) use of insulin: Secondary | ICD-10-CM

## 2016-02-11 DIAGNOSIS — D62 Acute posthemorrhagic anemia: Secondary | ICD-10-CM

## 2016-02-11 DIAGNOSIS — E119 Type 2 diabetes mellitus without complications: Secondary | ICD-10-CM

## 2016-02-11 DIAGNOSIS — I340 Nonrheumatic mitral (valve) insufficiency: Secondary | ICD-10-CM

## 2016-02-11 DIAGNOSIS — R0682 Tachypnea, not elsewhere classified: Secondary | ICD-10-CM | POA: Insufficient documentation

## 2016-02-11 LAB — URINALYSIS, ROUTINE W REFLEX MICROSCOPIC
BILIRUBIN URINE: NEGATIVE
Glucose, UA: NEGATIVE mg/dL
KETONES UR: NEGATIVE mg/dL
NITRITE: NEGATIVE
PH: 6.5 (ref 5.0–8.0)
PROTEIN: NEGATIVE mg/dL
Specific Gravity, Urine: 1.028 (ref 1.005–1.030)

## 2016-02-11 LAB — GLUCOSE, CAPILLARY
GLUCOSE-CAPILLARY: 149 mg/dL — AB (ref 65–99)
GLUCOSE-CAPILLARY: 142 mg/dL — AB (ref 65–99)
GLUCOSE-CAPILLARY: 196 mg/dL — AB (ref 65–99)
Glucose-Capillary: 162 mg/dL — ABNORMAL HIGH (ref 65–99)

## 2016-02-11 LAB — CBC
HEMATOCRIT: 38.1 % (ref 36.0–46.0)
HEMOGLOBIN: 11.3 g/dL — AB (ref 12.0–15.0)
MCH: 26.4 pg (ref 26.0–34.0)
MCHC: 29.7 g/dL — AB (ref 30.0–36.0)
MCV: 89 fL (ref 78.0–100.0)
Platelets: 583 10*3/uL — ABNORMAL HIGH (ref 150–400)
RBC: 4.28 MIL/uL (ref 3.87–5.11)
RDW: 15.2 % (ref 11.5–15.5)
WBC: 16.3 10*3/uL — AB (ref 4.0–10.5)

## 2016-02-11 LAB — BASIC METABOLIC PANEL
ANION GAP: 10 (ref 5–15)
BUN: 15 mg/dL (ref 6–20)
CALCIUM: 10.7 mg/dL — AB (ref 8.9–10.3)
CO2: 31 mmol/L (ref 22–32)
Chloride: 97 mmol/L — ABNORMAL LOW (ref 101–111)
Creatinine, Ser: 0.91 mg/dL (ref 0.44–1.00)
GLUCOSE: 163 mg/dL — AB (ref 65–99)
POTASSIUM: 3.7 mmol/L (ref 3.5–5.1)
SODIUM: 138 mmol/L (ref 135–145)

## 2016-02-11 LAB — URINE MICROSCOPIC-ADD ON

## 2016-02-11 MED ORDER — ENSURE ENLIVE PO LIQD
237.0000 mL | Freq: Three times a day (TID) | ORAL | Status: DC
  Administered 2016-02-11 – 2016-02-14 (×4): 237 mL via ORAL

## 2016-02-11 MED ORDER — FAMOTIDINE 20 MG PO TABS
20.0000 mg | ORAL_TABLET | Freq: Two times a day (BID) | ORAL | Status: DC
  Administered 2016-02-11 – 2016-02-14 (×7): 20 mg via ORAL
  Filled 2016-02-11 (×7): qty 1

## 2016-02-11 NOTE — Progress Notes (Addendum)
I met with pt and her spouse at bedside. He can only provide supervision at d/c due to his back issues and no other caregiver support in family available.  I discussed then need for SNF rehab to give prolonged rehab recovery functionally and for the need for IV antibiotics.  He is in agreement. RN CM and SW are already pursuing SNF at this time. We will sign off. [phone]

## 2016-02-11 NOTE — Progress Notes (Addendum)
STROKE TEAM PROGRESS NOTE  HISTORY AT TIME OF ADMISSION Margaret [name] is a 60 y.o. female who presents with headache/vision changes that started today. She had headaches starting Thursday which worsened over the course of the day. Today, she had sudden onset of vision loss with headache and lightheadedness. She has also had some nausea and vomiting.  Ct Head Wo Contrast - localized subarachnoid hemorrhage/debris in the left parietal occipital region.  Of note, she was just discharged from the hospital after an acute splenic infarction without clear etiology or source of embolus. She was on aspirin and Xarelto, aspirin was discontinued at her March 2 clinic visit. At that time, Dr. Alvy Bimler felt that it was most likely related to splenic congestion.   Here, she has low-grade fevers.  LKW: Thursday, 03/09 tpa given?: no, out of window, anticoagulated.    SUBJECTIVE (INTERVAL HISTORY) Patient  awake this morning. No one at bedside, per nurse is oriented to person, place, date, situation and following commands, cleared by speech for oral intake. No complaints OBJECTIVE Temp:  [97 F (36.1 C)-98.6 F (37 C)] 97.7 F (36.5 C) (03/21 0735) Pulse Rate:  [72-83] 73 (03/21 0735) Cardiac Rhythm:  [-] Normal sinus rhythm (03/21 0839) Resp:  [16-22] 18 (03/21 1146) BP: (134-156)/(70-89) 134/76 mmHg (03/21 1146) SpO2:  [95 %-96 %] 96 % (03/21 1146) Weight:  [245 lb 9.5 oz (111.4 kg)] 245 lb 9.5 oz (111.4 kg) (03/21 0356)  CBC:   Recent Labs Lab 02/05/16 0322  02/09/16 1252 02/11/16 0439  WBC 14.2*  < > 16.1* 16.3*  NEUTROABS 10.9*  --  13.0*  --   HGB 12.9  < > 11.0* 11.3*  HCT 41.5  < > 37.4 38.1  MCV 88.3  < > 90.3 89.0  PLT 398  < > 540* 583*  < > = values in this interval not displayed.  Basic Metabolic Panel:   Recent Labs Lab 02/08/16 0338 02/11/16 0439  NA 139 138  K 3.9 3.7  CL 98* 97*  CO2 28 31  GLUCOSE 153* 163*  BUN 7 15  CREATININE 0.88 0.91  CALCIUM 10.4*  10.7*    Lipid Panel:     Component Value Date/Time   CHOL 155 02/02/2016 1510   TRIG 137 02/02/2016 1510   HDL 21* 02/02/2016 1510   CHOLHDL 7.4 02/02/2016 1510   VLDL 27 02/02/2016 1510   LDLCALC 107* 02/02/2016 1510   HgbA1c:  Lab Results  Component Value Date   HGBA1C 8.4* 02/02/2016   Urine Drug Screen: No results found for: LABOPIA, COCAINSCRNUR, LABBENZ, AMPHETMU, THCU, LABBARB    IMAGING I have personally reviewed the radiological images below and agree with the radiology interpretations.  Dg Chest 2 View 02/01/2016    Cardiomegaly and pulmonary venous congestion.   Ct Head Wo Contrast 02/01/2016   Localized subarachnoid hemorrhage/debris in the left parietal occipital region as discussed above. MRI/MRA recommended.   Mri and Mra Head Wo Contrast 02/01/2016   1. Small volume subarachnoid hemorrhage/debris within the left parieto-occipital region, corresponding to abnormality seen on prior CT. Minimal gyral swelling within this region without significant mass effect or other abnormality.  2. Few sub cm cortical/subcortical ischemic infarcts within the bilateral parietal lobes as above.  3. Negative intracranial MRA.   CTA of head and neck 02/01/2016 1. Mild intracranial and extracranial atherosclerosis without significant stenosis. 2. Patent dural venous sinuses. No vascular malformation identified.  LE venous doppler - Bilateral: No evidence of DVT, superficial thrombosis,  or Baker's Cyst.  EEG - Unremarkable awake and drowsy routine inpatient EEG. Clinical correlation is recommended .   TEE - Normal LV size with EF 60-65%. Normal wall motion. Normal RV size and systolic function. Trivial TR, no TV vegetation. No PV vegetation. Trileaflet aortic valve with no stenosis or regurgitation, no vegetation. There was a mobile vegetation on the posterior leaflet of the mitral valve measuring about 2 cm in greatest dimension. The posterior and anterior leaflets of  the MV did not coapt properly and there was very eccentric, anteriorly-directed mitral regurgitation. I suspect severe MR. Jet was too eccentric for PISA calculations. There was systolic flattening but not flow reversal in the pulmonary vein doppler pattern. The left atrium was mildly dilated, no LA appendage thrombus. Normal right atrium. No evidence for PFO or ASD. Normal caliber aorta with mild plaque in the descending thoracic aorta.  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  Ct Head Wo Contrast 02/07/2016  IMPRESSION: Acute infarct posterior limb internal capsule Small area of subarachnoid hemorrhage right parietal area stable. No new area of hemorrhage. Electronically Signed   By: Franchot Gallo M.D.   On: 02/07/2016 18:44   MRI and MRA  02/07/2016  MRI HEAD 1. Patchy multi focal acute right MCA territory infarcts involving the right frontal, parietal, and temporal lobes as above, with involvement of the posterior limb of the right internal capsule. Scattered petechial hemorrhage about a few of these infarcts, with probable small volume subarachnoid hemorrhage within the right parietal lobe, similar relative prior CTs, but new relative to most recent MRI. 2. Probable new subcentimeter cortical infarct within the left parietal lobe, with interval evolution of previously identified punctate subcortical left parietal infarcts. 3. Small subacute appearing infarct within the left cerebellar hemisphere, new relative to most recent MRI from 02/01/2016. 4. Slight interval increase in prominence of subarachnoid hemorrhage within the left parieto-occipital region. While this finding may be related to technique, the possibility of small amount of re- hemorrhage within this region should be considered. Attention at follow-up CTs recommended.  MRA 1. No large or proximal arterial branch occlusion within the intracranial  circulation. 2. Question new 6 mm abnormality at the level of the right MCA bifurcation. Unclear whether this reflects motion artifact in relation to normal branch vessels, small amount of residual or new thrombus, and less likely new aneurysm (although this is not entirely excluded, with mycotic aneurysm certainly a possibility given the history of endocarditis). Further evaluation with dedicated CTA recommended. (Patient declined CTA.) 3. No other new abnormality within the intracranial circulation.  02/06/2016 : S/P Complete revascularization of occluded RT ICA terminus and RT MCA prox with x2 passes with Solitaire FR 69mm x 80mm retrieval device and x1 pass with solitaire FR 61mmx 30 mm retrieval device and 5.5 mg of superselective intraarterial integrelin achieving a TICI 3 revascularization   PHYSICAL EXAM  Temp:  [97 F (36.1 C)-98.6 F (37 C)] 97.7 F (36.5 C) (03/21 0735) Pulse Rate:  [72-83] 73 (03/21 0735) Resp:  [16-22] 18 (03/21 1146) BP: (134-156)/(70-89) 134/76 mmHg (03/21 1146) SpO2:  [95 %-96 %] 96 % (03/21 1146) Weight:  [245 lb 9.5 oz (111.4 kg)] 245 lb 9.5 oz (111.4 kg) (03/21 0356)  General - morbid obese middle aged Caucasian lady, well developed,    MS: Oriented to self, not year or month but patient sleeping and appears very tired, arouses with voice and then falls back to sleep. Asking for water.  Says "I'm cold". Following some simple commands.   Speech: Dysarthric but can be understood easily.   Ophthalmologic - Fundi not visualized due to noncooperation  Cardiovascular - Regular rate and rhythm.  Neuro - Patient is awake but disoriented. PEERL. Right gaze preference and does not cross the midline. No blink to threat on the left. Left facial droop. Positive cough and gag. Tongue midline. RUE and RLE spontaneous and purposeful movements. LUE flaccid and LLE withdraw to mild stim.    DTR 1+, left toe upgoing.Left arm flaccid and no w/d to nox stim.       ASSESSMENT/PLAN Ms. JOLEIGH RADEBAUGH is a 60 y.o. female with history of hypertension, hyperlipidemia, COPD, asthma, diabetes mellitus, migraines, panic attacks, and morbid obesity, presenting with mild headache, visual changes, low-grade fevers, and nausea. She did not receive IV t-PA due to late presentation and anticoagulation.  Acute stroke due to right ICA acute occlusion s/p endovascular thrombectomy of terminal RICA occlusion - likely septic emboli  Acute neuro changes 02/06/16 with left hemiplegia, right gaze and left neglect  S/p emergency thrombectomy with TICI 3 recannulization  Intubated and sedated  CT repeat showed only right BG infarct  MRI - Patchy multi focal acute right MCA territory infarcts involving the right frontal, parietal, and temporal lobes   MRA - Question new 6 mm abnormality at the level of the right MCA bifurcation. Dedicated CTA recommended. CTA ordered the patient refused.  No antiplatelet or anticoagulation needed due to endocarditis  Small SAH within the left parieto-occipital region, and 4 punctate infarcts at the watershed area including left MCA/PCA, bilateral MCA/ACA, likely due to endocarditis. No mycotic aneurysm found on CTA. CTV has ruled out CVT.   Resultant asymptomatic  MRI - left parieto-occipital small SAH, punctate infarcts at the watershed area (left MCA/PCA, b/l MCA/ACA)  MRA - negative  CTA head and neck - no cerebral venous thrombosis, AVM, or aneurysm.  2D Echo - EF 60-65% in 12/2015  LE venous doppler - negative for DVT  EEG - negative   TEE - MV endocarditis and MV regurgitation  LDL - 107  HgbA1c 8.4  Hypercoagulable work up positive lupus anticoagulant likely due to Xarelto use PTA. However, ANA and RF positive, need repeat as outpt  VTE prophylaxis - SCDs DIET - DYS 1 Room service appropriate?: Yes; Fluid consistency:: Thin  Xarelto (rivaroxaban) daily prior to admission, now on No antithrombotic  secondary to [facility]. Due to endocarditis, no antiplatelet or anticoagulation indicated at this time.  Patient counseled to be compliant with her antithrombotic medications  Ongoing aggressive stroke risk factor management  MV endocarditis with septic emboli - explains spleen infarct, SAH and punctate embolic strokes  TEE showed MV endocarditis and MV regurgitation  CTA head and neck showed no mycotic aneurysm  Blood culture 2/2 strep vividan positive  ID on board   Elevated WBC  On Rocephin  Repeat BCx NGTD  Consult Dr Roxy Manns - she will eventually need mitral valve repair/replacement if she recovers from neurological standpoint.  Hypertension  Stable mildly low blood pressures  Avoid hypotension  Hyperlipidemia  Home meds:  Lovastatin 20 mg daily resumed in hospital  LDL 107, goal < 70  On pravastatin 20mg  now  Continue statin at discharge  Diabetes  HgbA1c 8.4, goal < 7.0  Uncontrolled  On lantus  SSI  Other Stroke Risk Factors  Advanced age  Cigarette smoker, quit smoking 3 years ago.  Morbid obesity, Body mass index is 40.87 kg/(m^2).  Hx stroke/TIA  Family hx stroke (father)  Migraines  PVD - aortic athrosclerosis  Other Active Problems  Leukocytosis - 14.2 -> 24.5 -> 20.9 (Saturday)  Elevated creatinine 1.10  Mild hyponatremia  Pulmonary HTN  Hilar lymphoadenopathy  Positive ANA and RF, need to repeat as outpt. Rheumatology referral if needed.   Hospital day # 10   This patient is critically ill due to acute ICA occlusion, infarct s/p thrombectomy, endocarditis and at significant risk of neurological worsening, death form recurrent infarct, vessel occlusion, hemorrhagic infarct, SAH or ICH, sepsis, seizure. This patient's care requires constant monitoring of vital signs, hemodynamics, respiratory and cardiac monitoring, review of multiple databases, neurological assessment, discussion with family, other specialists and medical  decision making of high complexity. I spent 30 minutes of neurocritical care time in the care of this patient. Agree with transfer to [facility] in few daysr. She will need cardiac surgery for mitral valve replacement eventually. Continue antibiotics as per ID. Stroke team will sign off. Kindly call for questions.   Personally examined patient and images, and have participated in and made any corrections needed to history, physical, neuro exam,assessment and plan as stated above.  I have personally obtained the history, evaluated lab date, reviewed imaging studies and agree with radiology interpretations.    Antony Contras, MD Neurology [phone] [facility]      To contact Stroke Continuity provider, please refer to [email or web address] After hours, contact General Neurology

## 2016-02-11 NOTE — Consult Note (Signed)
Physical Medicine and Rehabilitation Consult Reason for Consult: Subarachnoid hemorrhage in the left parietal-occipital region and cortical subcortical ischemic infarcts within the bilateral parietal lobes  HPI: Margaret [name] is a 60 y.o. right handed female with history of his. By report patient lives with husband and was independent until February 2017. Recently discharged February 2017 from the hospital after an acute splenic infarct without clear etiology or source of embolus maintained on Xarelto. Presented 02/01/2016 with headache, dizziness and blurred vision. She has some nausea and vomiting. Low-grade fever 100.7. WBC 17,900. CT/MRI of the head showed localized subarachnoid hemorrhage in the left parietal-occipital region as well as few subcentimeter cortical subcortical ischemic infarcts within the bilateral parietal lobes.Marland Kitchen MRA of the head with no obvious high-grade stenosis or aneurysm. CTA of head and neck mild intracranial extracranial atherosclerotic type changes without stenosis. EEG negative for seizure. Venous Dopplers bilateral lower extremities negative for DVT. TEE showed a 2 cm vegetation on the mitral valve with severe mitral regurgitation. Blood cultures Streptococcus viridans. Infectious disease as well as cardiothoracic surgery consulted. As per Dr. Roxy Manns of cardiothoracic surgery patient would need mitral valve repair or replacement once medically stable. Currently maintained on ampicillin advised to continue through 03/16/2016. On 02/06/2016 patient sustained a fall with initial follow-up cranial CT scan negative but then began to have left-sided deficits with follow-up scan in imaging of the head 02/07/2016 showed questionably a 6 mm abnormality at the level of the right MCA bifurcation. Interventional radiology consulted underwent complete revascularization of occluded right ICA terminus and right MCA proximally. Currently maintained on a dysphagia #1 thin liquid  diet. Physical and occupational therapy evaluation completed 02/10/2016 with recommendations of physical medicine rehabilitation consult.   Review of Systems  Unable to perform ROS: medical condition   Past Medical History  Diagnosis Date  . Hypertension   . Hyperlipidemia   . COPD (chronic obstructive pulmonary disease) ([place]) 04/20/12  . Asthma   . Type II diabetes mellitus ([place])   . Anemia   . H/O hiatal hernia   . Osteoarthritis   . Pneumonia 04/2012  . Migraines   . Panic attacks 04/20/12  . Morbid obesity ([place])   . Heart murmur   . Bacterial endocarditis     Strep viridans   . Chronic diastolic CHF (congestive heart failure) ([place]) 04/27/2012  . Pulmonary hypertension assoc with unclear multi-factorial mechanisms ([place]) 01/23/2016  . Positive ANA (antinuclear antibody)   . 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 Stroke ([place])   . Subarachnoid bleed ([place]) 02/01/2016   Past Surgical History  Procedure Laterality Date  . Laceration repair  1966    "right upper arm"  . Dilation and curettage of uterus  1990's  . Finger reattachment  1973    "right ring finger"  . Tee without cardioversion N/A 02/05/2016    Procedure: TRANSESOPHAGEAL ECHOCARDIOGRAM (TEE);  Surgeon: Larey Dresser, MD;  Location: [facility];  Service: Cardiovascular;  Laterality: N/A;  . Radiology with anesthesia N/A 02/06/2016    Procedure: RADIOLOGY WITH ANESTHESIA;  Surgeon: Luanne Bras, MD;  Location: [facility];  Service: Radiology;  Laterality: N/A;   Family History  Problem Relation Age of Onset  . Allergies    . Hypertension    . Coronary artery disease Mother 67  . Stroke Father   . COPD Father   . Clotting disorder Maternal Grandmother     died from blood clot   Social History:  reports that she quit smoking  about 3 years ago. Her smoking use included Cigarettes. She has a 8 pack-year smoking history. She has never used smokeless tobacco. She reports that she does not drink alcohol or use illicit  drugs. Allergies:  Allergies  Allergen Reactions  . Codeine Hives, Itching and Other (See Comments)    "breathing problems"  . Latex Itching   Medications Prior to Admission  Medication Sig Dispense Refill  . albuterol (PROVENTIL HFA;VENTOLIN HFA) 108 (90 BASE) MCG/ACT inhaler Inhale 2 puffs into the lungs every 4 (four) hours as needed. For shortness of breath.    Marland Kitchen albuterol (PROVENTIL) (2.5 MG/3ML) 0.083% nebulizer solution Take 2.5 mg by nebulization at bedtime.     . Alogliptin-Metformin HCl 12.03-999 MG TABS Take 1 tablet by mouth 2 (two) times daily.    Marland Kitchen aspirin 81 MG chewable tablet Chew 81 mg by mouth daily.    Marland Kitchen atenolol (TENORMIN) 50 MG tablet Take 12.5 mg by mouth 2 (two) times daily.     . cholecalciferol (VITAMIN D) 1000 units tablet Take 1,000 Units by mouth 2 (two) times daily.    . furosemide (LASIX) 40 MG tablet Take 40 mg by mouth daily.     Marland Kitchen ibuprofen (ADVIL,MOTRIN) 200 MG tablet Take 600 mg by mouth every 6 (six) hours as needed for headache or mild pain.    Marland Kitchen insulin NPH-regular (NOVOLIN 70/30) (70-30) 100 UNIT/ML injection Inject 6-16 Units into the skin 2 (two) times daily with a meal.    . loperamide (IMODIUM) 2 MG capsule Take 2 mg by mouth daily as needed for diarrhea or loose stools.    . lovastatin (MEVACOR) 20 MG tablet Take 10 mg by mouth at bedtime.    . naproxen sodium (ANAPROX) 220 MG tablet Take 220 mg by mouth 2 (two) times daily as needed (pain).    . nitroGLYCERIN (NITROSTAT) 0.4 MG SL tablet Place 1 tablet (0.4 mg total) under the tongue every 5 (five) minutes x 3 doses as needed for chest pain. 25 tablet 6  . oxyCODONE-acetaminophen (PERCOCET) 10-325 MG tablet Take 1 tablet by mouth 2 (two) times daily as needed for pain.    Marland Kitchen Potassium 99 MG TABS Take 99 mg by mouth every morning.    . Rivaroxaban (XARELTO STARTER PACK) 15 & 20 MG TBPK Take as directed on package: Start with one 15mg  tablet by mouth twice a day with food. On Day 22, switch to one  20mg  tablet once a day with food. 51 each 0  . spironolactone (ALDACTONE) 25 MG tablet Take 50 mg by mouth daily.     . ciprofloxacin (CIPRO) 500 MG tablet Take 1 tablet (500 mg total) by mouth 2 (two) times daily. (Patient not taking: Reported on 02/01/2016) 10 tablet 0  . metroNIDAZOLE (FLAGYL) 500 MG tablet Take 1 tablet (500 mg total) by mouth every 8 (eight) hours. (Patient not taking: Reported on 02/01/2016) 15 tablet 0    Home: Home Living Family/patient expects to be discharged to:: Inpatient rehab Living Arrangements: Spouse/significant other Available Help at Discharge: Family, Available PRN/intermittently Type of Home: House Home Access: Stairs to enter [employer] of Steps: 3 Entrance Stairs-Rails: Right Home Layout: One level Bathroom Shower/Tub: Chiropodist: Standard  Lives With: Spouse  Functional History: Prior Function Level of Independence: Independent Functional Status:  Mobility: Bed Mobility Overal bed mobility: Needs Assistance, +2 for physical assistance Bed Mobility: Rolling, Sidelying to Sit, Sit to Supine Rolling: Max assist, +2 for physical assistance Sidelying to  sit: Max assist, +2 for physical assistance, HOB elevated Sit to supine: Max assist, +2 for physical assistance General bed mobility comments: pt does attempt to A with R UE and LE during bed mobility, but does require extensive 2 person A.   Transfers Overall transfer level: Modified independent Equipment used: None Transfers: Sit to/from Stand Sit to Stand: Modified independent (Device/Increase time) General transfer comment: did not attempt this date  Ambulation/Gait Ambulation/Gait assistance: Supervision Ambulation Distance (Feet): 110 Feet (+60 ft + 110 ft.  ) Assistive device: None Gait Pattern/deviations: Step-through pattern, Wide base of support, Decreased stride length General Gait Details: Pt reports fatigue requiring cues for reciprocal armswing.    Gait velocity: decr Gait velocity interpretation: Below normal speed for age/gender Stairs: Yes Stairs assistance: Min guard Stair Management: No rails (Pt used HHA on L ascending and R descending from spouse.  ) Number of Stairs: 6 General stair comments: Pt performed x 2 trials with cues from PTA to spouse for guarding techniques.  Pt performed with min guard assist.      ADL: ADL Overall ADL's : Needs assistance/impaired Eating/Feeding: Minimal assistance, Bed level Grooming: Wash/dry hands, Wash/dry face, Oral care, Brushing hair, Moderate assistance, Bed level Upper Body Bathing: Maximal assistance, Bed level Lower Body Bathing: Maximal assistance, Bed level Upper Body Dressing : Maximal assistance, Bed level, Sitting Lower Body Dressing: Total assistance, Bed level Toilet Transfer: Total assistance Toilet Transfer Details (indicate cue type and reason): unable to safely assist this date  Toileting- Water quality scientist and Hygiene: Total assistance, Bed level Toileting - Clothing Manipulation Details (indicate cue type and reason): Pt assisted with bedpan use  Functional mobility during ADLs: Maximal assistance, +2 for physical assistance General ADL Comments: Pt quickly self distracts when engaged in an activity   Cognition: Cognition Overall Cognitive Status: Impaired/Different from baseline Arousal/Alertness: Lethargic Orientation Level: Oriented to person, Oriented to place, Oriented to time Attention: Sustained Focused Attention: Appears intact Sustained Attention: Impaired Sustained Attention Impairment: Verbal complex, Functional basic Memory: Impaired Memory Impairment: Decreased recall of new information Awareness: Appears intact Problem Solving: Impaired Problem Solving Impairment: Functional complex Executive Function: Reasoning Reasoning: Appears intact Behaviors: Other (comment) (anxious`) Safety/Judgment: Appears intact Cognition Arousal/Alertness:  Awake/alert Behavior During Therapy: WFL for tasks assessed/performed Overall Cognitive Status: Impaired/Different from baseline Area of Impairment: Attention, Following commands, Safety/judgement, Awareness, Problem solving, Memory Current Attention Level: Selective Memory: Decreased short-term memory Following Commands: Follows one step commands with increased time Safety/Judgement: Decreased awareness of safety, Decreased awareness of deficits Awareness: Emergent Problem Solving: Slow processing, Decreased initiation, Difficulty sequencing, Requires verbal cues, Requires tactile cues General Comments: Pt self distracts with discomfort and anxiety due to her spouse's absence   Blood pressure 156/70, pulse 83, temperature 97 F (36.1 C), temperature source Axillary, resp. rate 18, height 5\' 5"  (1.651 m), weight 111.4 kg (245 lb 9.5 oz), SpO2 96 %. Physical Exam  Vitals reviewed. Constitutional: She is oriented to person, place, and time. She appears well-developed.  60 year old obese female  HENT:  Head: Normocephalic and atraumatic.  Eyes: EOM are normal. Right eye exhibits no discharge. Left eye exhibits no discharge.  Pupils sluggish to light  Neck: Normal range of motion. Neck supple. No thyromegaly present.  Cardiovascular: Normal rate and regular rhythm.   Respiratory: Effort normal. No respiratory distress.  Limited inspiratory effort  GI: Soft. Bowel sounds are normal.  Musculoskeletal: She exhibits edema. She exhibits no tenderness.  Neurological: She is oriented to person, place, and time.  Patient  is lethargic and difficult to arouse.  Left facial weakness Left Motor: Right upper extremity: 4/5 proximal distal Right lower cervical and hip flexion, knee extension 4 -/5, ankle dorsi/plantar flexion 4+/5 Left upper extremity/left lower cavity: 0/5 DTRs symmetric Sensation intact to light touch  Skin: Skin is warm and dry.  Psychiatric: Her affect is blunt. Her speech is  delayed. She is slowed.    Results for orders placed or performed during the hospital encounter of 02/01/16 (from the past 24 hour(s))  Glucose, capillary     Status: Abnormal   Collection Time: 02/10/16  8:42 AM  Result Value Ref Range   Glucose-Capillary 145 (H) 65 - 99 mg/dL   Comment 1 Notify RN    Comment 2 Document in Chart   Glucose, capillary     Status: Abnormal   Collection Time: 02/10/16 11:41 AM  Result Value Ref Range   Glucose-Capillary 142 (H) 65 - 99 mg/dL   Comment 1 Notify RN    Comment 2 Document in Chart   Glucose, capillary     Status: Abnormal   Collection Time: 02/10/16  4:46 PM  Result Value Ref Range   Glucose-Capillary 122 (H) 65 - 99 mg/dL   Comment 1 Notify RN    Comment 2 Document in Chart   Glucose, capillary     Status: Abnormal   Collection Time: 02/10/16  9:48 PM  Result Value Ref Range   Glucose-Capillary 148 (H) 65 - 99 mg/dL  Basic metabolic panel     Status: Abnormal   Collection Time: 02/11/16  4:39 AM  Result Value Ref Range   Sodium 138 135 - 145 mmol/L   Potassium 3.7 3.5 - 5.1 mmol/L   Chloride 97 (L) 101 - 111 mmol/L   CO2 31 22 - 32 mmol/L   Glucose, Bld 163 (H) 65 - 99 mg/dL   BUN 15 6 - 20 mg/dL   Creatinine, Ser 0.91 0.44 - 1.00 mg/dL   Calcium 10.7 (H) 8.9 - 10.3 mg/dL   GFR calc non Af Amer >60 >60 mL/min   GFR calc Af Amer >60 >60 mL/min   Anion gap 10 5 - 15  CBC     Status: Abnormal   Collection Time: 02/11/16  4:39 AM  Result Value Ref Range   WBC 16.3 (H) 4.0 - 10.5 K/uL   RBC 4.28 3.87 - 5.11 MIL/uL   Hemoglobin 11.3 (L) 12.0 - 15.0 g/dL   HCT 38.1 36.0 - 46.0 %   MCV 89.0 78.0 - 100.0 fL   MCH 26.4 26.0 - 34.0 pg   MCHC 29.7 (L) 30.0 - 36.0 g/dL   RDW 15.2 11.5 - 15.5 %   Platelets 583 (H) 150 - 400 K/uL   No results found.  Assessment/Plan: Diagnosis: Subarachnoid hemorrhage in the left parietal-occipital region and cortical subcortical ischemic infarcts within the bilateral parietal lobes Labs and  images independently reviewed.  Records reviewed and summated above. Stroke: Continue secondary stroke prophylaxis and Risk Factor Modification listed below:   Antiplatelet therapy:   Blood Pressure Management:  Continue current medication with prn's with permisive HTN per primary team Statin Agent:   Diabetes management:   Left sided hemiparesis: fit for orthotics to prevent contractures (resting hand splint for day, wrist cock up splint at night, PRAFO, etc) Motor recovery: Fluoxetine  1. Does the need for close, 24 hr/day medical supervision in concert with the patient's rehab needs make it unreasonable for this patient to be served in  a less intensive setting? Yes  2. Co-Morbidities requiring supervision/potential complications: HTN (monitor and provide prns in accordance with increased physical exertion and pain), COPD (continue to monitor O2 sats and RR with increased physical activity), type 2 diabetes mellitus (Monitor in accordance with exercise and adjust meds as necessary), vegetation on the mitral valve (patient to require valve replacement, cont abx), revascularization of occluded right ICA, tachypnea (monitor RR and O2 Sats with increased physical exertion), ABLA (transfuse if necessary to ensure appropriate perfusion for increased activity tolerance), leukocytosis (cont to monitor for signs and symptoms of infection, further workup if indicated), splenic infarct, morbid obesity (Body mass index is 40.87 kg/(m^2)., diet and exercise education, encourage weight loss to increase endurance and promote overall health) 3. Due to bladder management, bowel management, safety, skin/wound care, disease management, medication administration and patient education, does the patient require 24 hr/day rehab nursing? Yes 4. Does the patient require coordinated care of a physician, rehab nurse, PT (1-2 hrs/day, 5 days/week), OT (1-2 hrs/day, 5 days/week) and SLP (1-2 hrs/day, 5 days/week) to address  physical and functional deficits in the context of the above medical diagnosis(es)? Yes Addressing deficits in the following areas: balance, endurance, locomotion, strength, transferring, bowel/bladder control, bathing, dressing, feeding, grooming, toileting, cognition, speech, language, swallowing and psychosocial support 5. Can the patient actively participate in an intensive therapy program of at least 3 hrs of therapy per day at least 5 days per week? Potentially 6. The potential for patient to make measurable gains while on inpatient rehab is excellent 7. Anticipated functional outcomes upon discharge from inpatient rehab are min assist and mod assist  with PT, min assist and mod assist with OT, min assist with SLP. 8. Estimated rehab length of stay to reach the above functional goals is: 20-24 days. 9. Does the patient have adequate social supports and living environment to accommodate these discharge functional goals? Potentially 10. Anticipated D/C setting: Other 11. Anticipated post D/C treatments: HH therapy and Home excercise program 12. Overall Rehab/Functional Prognosis: good and fair  RECOMMENDATIONS: This patient's condition is appropriate for continued rehabilitative care in the following setting: Will need to clarify availability of support at discharge. Patient will likely need significant support and will be unable to be cared for by her spouse alone. If this is the case patient does not have additional support, patient may need SNF after medical stability and plan for valve replacement. Patient has agreed to participate in recommended program. Potentially Note that insurance prior authorization may be required for reimbursement for recommended care.  Comment: Rehab Admissions Coordinator to follow up.  Delice Lesch, MD 02/11/2016
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
## 2016-02-11 NOTE — Progress Notes (Signed)
[address]       [place] [address]             [phone]     CARDIOTHORACIC SURGERY PROGRESS NOTE  5 Days Post-Op  S/P Procedure(s) (LRB): RADIOLOGY WITH ANESTHESIA (N/A)  Subjective: Awake and alert, working with OT and PT.  Dense left sided hemiplegia, dysarthria and left-sided neglect persist.  Denies SOB at rest but O2 sats drop with activity.  Objective: Vital signs in last 24 hours: Temp:  [97 F (36.1 C)-98.6 F (37 C)] 97.7 F (36.5 C) (03/21 0735) Pulse Rate:  [72-83] 73 (03/21 0735) Cardiac Rhythm:  [-] Normal sinus rhythm (03/21 0839) Resp:  [16-22] 22 (03/21 0735) BP: (143-156)/(70-89) 143/89 mmHg (03/21 0735) SpO2:  [95 %-96 %] 95 % (03/21 0735) Weight:  [245 lb 9.5 oz (111.4 kg)] 245 lb 9.5 oz (111.4 kg) (03/21 0356)  Physical Exam:  Rhythm:   sinus  Breath sounds: Diminished at bases  Heart sounds:  RRR w/ systolic murmur  Incisions:  n/a  Abdomen:  soft  Extremities:  warm   Intake/Output from previous day: 03/20 0701 - 03/21 0700 In: 525 [P.O.:415; I.V.:60; IV Piggyback:50] Out: -  Intake/Output this shift:    Lab Results:  Recent Labs  02/09/16 1252 02/11/16 0439  WBC 16.1* 16.3*  HGB 11.0* 11.3*  HCT 37.4 38.1  PLT 540* 583*   BMET:  Recent Labs  02/11/16 0439  NA 138  K 3.7  CL 97*  CO2 31  GLUCOSE 163*  BUN 15  CREATININE 0.91  CALCIUM 10.7*    CBG (last 3)   Recent Labs  02/10/16 2148 02/11/16 0738 02/11/16 1110  GLUCAP 148* 149* 162*   PT/INR:  No results for input(s): LABPROT, INR in the last 72 hours.  CXR:  N/A  Assessment/Plan: S/P Procedure(s) (LRB): RADIOLOGY WITH ANESTHESIA (N/A)  Patient remains in early phase of recovery from her most recent stroke.  She has been afebrile w/ mild persistent leukocytosis.  Patient has not been seen in f/u by Cardiology since her most recent stroke.  At present there are no obvious signs of worsening CHF or development of AV block.  The  patient still has active bacterial endocarditis w/ severe mitral regurgitation.  Although she obviously remains at significant risk for recurrent embolization, I don't think she has recovered sufficiently from her most recent stroke to consider proceeding with mitral valve repair/replacement.  However, I think it is premature to suggest that her underlying problems should be treated only with palliative measures and I would consider surgical intervention if she continues to demonstrate significant recovery.  She has not been seen in f/u by the Cardiology team since her most recent stroke.  She probably should be followed closely and she will eventually need to undergo left and right heart catheterization if surgery is to be contemplated further.  She has not been seen in consultation by the Dental Service despite the diagnosis of Strep viridans endocarditis.  I will call Dr. Lawana Chambers and ask him to get involved.  I agree with plans to consider d/c to the CIR service where she could undergo aggressive rehab, continue intravenous antibiotics, and be followed closely by the Cardiology, Neurology, and Infectious Disease teams.  We will continue to follow periodically as well.   I spent in excess of 30 minutes during the conduct of this hospital encounter and >50% of this time involved direct face-to-face encounter with the patient for  counseling and/or coordination of their care.   Rexene Alberts, MD 02/11/2016 12:05 PM

## 2016-02-11 NOTE — Clinical Social Work Note (Signed)
Clinical Social Work Assessment  Patient Details  Name: Margaret [name] MRN: [id number] Date of Birth: 1956-08-01  Date of referral:  02/11/16               Reason for consult:  Discharge Planning, Facility Placement                Permission sought to share information with:  Facility Sport and exercise psychologist, Family Supports Permission granted to share information::  Yes, Verbal Permission Granted  Name::     Engineer, manufacturing systems::  SNFs  Relationship::     Contact Information:     Housing/Transportation Living arrangements for the past 2 months:  Single Family Home Source of Information:  Spouse Patient Interpreter Needed:  None Criminal Activity/Legal Involvement Pertinent to Current Situation/Hospitalization:  No - Comment as needed Significant Relationships:  Spouse Lives with:  Spouse Do you feel safe going back to the place where you live?  Yes Need for family participation in patient care:  Yes (Comment)  Care giving concerns:  [name] husband agrees with recommendation for SNF placement at discharge due to patient's current care needs.   Social Worker assessment / plan:  CSW met with patient at bedside to complete assessment. Patient was not able to engage with CSW for assessment. CSW contacted patient's husband by phone to complete assessment. The patient and family are both agreeable to SNF placement. CSW explained that the patient's options would be significantly limited given that she does not have a payer source for SNF at this time. Per the husband the patient has a Medicaid application on file. CSW explained SNF search and placement process to patient's husband. CSW will followup with available bed offers.  Employment status:  Disabled (Comment on whether or not currently receiving Disability) Insurance information:  Self Pay (Medicaid Pending) (Per husband, patient has applied for Medicaid.) PT Recommendations:  [place] / Referral to  community resources:  [place]  Patient/Family's Response to care:  The patient and family appear happy with the care the patient has received. [name] husband states that he has been impressed by the assistance from financial counseling.  Patient/Family's Understanding of and Emotional Response to Diagnosis, Current Treatment, and Prognosis:  [name] husband appear to have a fair understanding of the patient's prognosis and post DC needs. The husband appears to be coping well with the patient's hospitalization.  Emotional Assessment Appearance:  Appears stated age Attitude/Demeanor/Rapport:  Unable to Assess Affect (typically observed):  Unable to Assess Orientation:  Oriented to Self, Oriented to Place, Oriented to  Time Alcohol / Substance use:  Not Applicable Psych involvement (Current and /or in the community):  No (Comment)  Discharge Needs  Concerns to be addressed:  Discharge Planning Concerns Readmission within the last 30 days:  Yes Current discharge risk:  Chronically ill, Cognitively Impaired, Physical Impairment Barriers to Discharge:  Continued Medical Work up   Rigoberto Noel, LCSW 02/11/2016, 5:54 PM

## 2016-02-11 NOTE — Progress Notes (Signed)
Notified Dr. Ricard Dillon office nurse regarding pt not being able to do an orthopantogram.  This procedure requires the pt to be able to pull themselves up and pt is unable to due that related to flaccid left side from stroke.

## 2016-02-11 NOTE — Progress Notes (Signed)
   02/11/16 1519  OT Visit Information  Last OT Received On 02/11/16  Assistance Needed +2  PT/OT/SLP Co-Evaluation/Treatment Yes  Reason for Co-Treatment Complexity of the patient's impairments (multi-system involvement);For patient/therapist safety  OT goals addressed during session Strengthening/ROM  History of Present Illness pt initially presented with L Parieto-Occipital small SAH and at least 4 punctate watershed infarcts.  On 3/16 pt sustained a fall with initial f/u CT negative, but then began to have L sided deficits and stat CT revealed new R MCA Infarct and pt is now s/p R MCA and R ICA Revascularization and clot retrieval.  pt intubated 3/16-3/18.  pt with hx of HTN, COPD, Asthma, DM, Anemia, Hiatal Hernia, Migraines, Panic Attacks, CHF, Pulmonary HTN, Splenic Infarct, SAH, and CVA.    OT Time Calculation  OT Start Time (ACUTE ONLY) 1304  OT Stop Time (ACUTE ONLY) 1353  OT Time Calculation (min) 49 min  Precautions  Precautions Fall  Precaution Comments dense L hemiplegia  Pain Assessment  Pain Assessment Faces  Faces Pain Scale 4  Pain Location neck  Pain Descriptors / Indicators Aching  Pain Intervention(s) Monitored during session;Repositioned  Cognition  Arousal/Alertness Awake/alert  Behavior During Therapy WFL for tasks assessed/performed;Agitated  Overall Cognitive Status Impaired/Different from baseline  Area of Impairment Attention;Following commands;Safety/judgement;Awareness;Problem solving;Memory  Current Attention Level Selective  Memory Decreased short-term memory  Following Commands Follows one step commands with increased time  Safety/Judgement Decreased awareness of safety;Decreased awareness of deficits  Problem Solving Slow processing;Decreased initiation;Difficulty sequencing;Requires verbal cues;Requires tactile cues  General Comments Pt distracted by wanting to talk to her son.  ADL  Toilet Transfer Total assistance  Toilet Transfer Details  (indicate cue type and reason) total for bed pan use  Toileting- Clothing Manipulation and Hygiene Total assistance;Bed level  General ADL Comments Assisted pt to place a call to her son using her cell phone after returned to bed.  Bed Mobility  Overal bed mobility Needs Assistance;+2 for physical assistance  Bed Mobility Rolling;Sit to Supine  Rolling Mod assist (to L)  Sit to supine Total assist;+2 for physical assistance  General bed mobility comments Pt assisted for all aspects.  Balance  Sitting balance-Leahy Scale Zero  Standing balance-Leahy Scale Zero  Transfers  Overall transfer level Needs assistance  Transfer via [place] transfer comment total assist with use of sara plus, pt assisted with positioning L UE to prevent injury  OT - End of Session  Equipment Utilized During Treatment Gait belt;Oxygen  Activity Tolerance Patient limited by fatigue  Patient left in bed;with call bell/phone within reach;with bed alarm set;with nursing/sitter in room  OT Assessment/Plan  OT Plan Discharge plan remains appropriate  Follow Up Recommendations CIR;Supervision/Assistance - 24 hour  OT Equipment 3 in 1 bedside comode;Tub/shower bench  OT Goal Progression  Progress towards OT goals Progressing toward goals  Acute Rehab OT Goals  Patient Stated Goal to speak to her son  OT General Charges  $OT Visit 1 Procedure  OT Treatments  $Self Care/Home Management  8-22 mins  $Therapeutic Activity 8-22 mins  02/11/2016  02/11/2016 Nestor Lewandowsky, OTR/L Pager: [phone]

## 2016-02-11 NOTE — Progress Notes (Signed)
Pt. Appears confused this am not following conversation regarding PICC insertion.  Husband call to obtain consent.  No answer.  Will attempt again later.

## 2016-02-11 NOTE — Progress Notes (Signed)
Nutrition Follow-up  DOCUMENTATION CODES:   Obesity unspecified  INTERVENTION:  -Provide Ensure Enlive TID, 350 kcal, 20 grams of protein per bottle  NUTRITION DIAGNOSIS:   Inadequate oral intake related to poor appetite as evidenced by meal completion < 25%.  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 Also found to have hepatic steatosis. On 3/16 she underwent emergent cerebral angiogram and clot retrieval from the R ICA artery and R MCA artery, returned from IR on the vent   3/18-Extubated 3/18- Advanced to dysphagia 1 diet, thin liquids   Per chart pt consuming 5%-25% of meals. Spoke with pt's husband, he reports her appetite has good and bad days. Pt was distressed and yelling "water" during visit, husband request to keep visit short so he could attend to her. Pt's husband states pt likes Ensure, especially chocolate. Will provide Ensure Enlive TID to help meet nutritional needs.   SLP continues to follow pt for dysphagia.  Labs reviewed. Medications reviewed.  Diet Order:  DIET - DYS 1 Room service appropriate?: Yes; Fluid consistency:: Thin  Skin:  Reviewed, no issues  Last BM:  02/10/2016  Height:   Ht Readings from Last 1 Encounters:  02/02/16 5\' 5"  (1.651 m)    Weight:   Wt Readings from Last 1 Encounters:  02/11/16 245 lb 9.5 oz (111.4 kg)    Ideal Body Weight:  56.8 kg  BMI:  Body mass index is 40.87 kg/(m^2).  Estimated Nutritional Needs:   Kcal:  1700-1900  Protein:  90-100 grams   Fluid:  1.7-1.9 L  EDUCATION NEEDS:   No education needs identified at this time  [place], Dietetic Intern Pager: [phone]

## 2016-02-11 NOTE — Progress Notes (Signed)
PULMONARY / CRITICAL CARE MEDICINE   Name: Margaret [name] MRN: [id number] DOB: 1956/01/09    ADMISSION DATE:  02/01/2016 CONSULTATION DATE:  3/16  REFERRING MD:  TRH  CHIEF COMPLAINT:  CVA s/p neuro IR  BRIEF:  60 year old female with past medical history of hypertension, hyperlipidemia, COPD, diabetes mellitus, and CHF. She recently admitted in February of this year for acute splenic infarction without clear etiology, however at that time she was also diagnosed with hepatic cirrhosis and he was initially thought this was the cause of the infarct as echocardiogram the time showed no cardiac source of emboli. She was started on anticoagulation and was slated to obtain outpatient TEE.  She returned to the hospital on 3/11 with fever, headache and a small subarachnoid bleed as well as several small "punctate infarcts" consistent with embolic events.  Blood cultures were positive for strep viridans at that point.  TEE showed a 2cm vegetation on the mitral valve with severe mitral regurgitation.  Cardiothoracic surgery was consulted on 3/16.    On 3/16 she had a mechanical fall and had a CT head that was read as normal around 1400.  Apparently she was given ativan.  After return from CT imaging she was noted to have left facial droop, slurred speech around 4PM.  Stroke was called, CT head repeated, then IR was consulted for possible cerebral angiogram.  She underwent an angiogram showing a R MCA embolism and occluded right internal carotid artery embolism.  These were retrieved and then she underwent selective intra-arterial integrilin infusion.    She returned from the procedure intubated and hypertensive    CULTURES: 3/11 blood > viridans strep 3/14 blood > negative  ANTIBIOTICS: 3/12  Ceftriaxone > 3/16 3/16 Ampicillin >    STUDIES/eVENTS 02/01/2016 - admit:  3/11 CT head > localized subarachnoid hemorrhage/debris in left parietal occipital region 3/11 CT angiogram> mild  intracranial and extracranial atherosclerosis without stenosis, patent dural venous sinuses, no vascular malformation 3/11 MRI/MRA brain > small volume subarachnoid hemorrhage/debri left parieto-occipital region, few sub cm cortical subcortical ischemic infarcts in bilateral parietal lobes, negative MRA 3/14 EEG > no evidence of epileptiform discharges 3/15 TEE> 2 cm mitral valve vegetation with regurgitation 3/15 CT ab/pelvis> new splenic infarct, larger than before, enlarged spleen, diffuse hepatic steatosis, mosaic attenuation of lung bases, consider pulmonary arterial hypertension 3/16 CT head > No significant change from prior 3/16 CT head> decreased conspicuity of left parieto-occipital subarachnoid hemorrhage; ? New area of hemorrhage R parietal sulcus. ACUTE STROKE. INTUBATE 3/16 IR angiogram> R internal carotid and R MCA occlusion 3/16 CT head > unchanged small volume R parietal and left parieto occipital subarachnoid hemorrhage 3/17 Head Ct > interval decrease small vol R parietal and L parietal-occipital bleed, no new bleed, no acute infarct yet 3/18 MRI brain >> patchy multifocal right MCA infarct, scattered parenchymal hemorrhage, probable new left parietal cortical infarct, subacute left cerebellar hemisphere CVA, slight interval increase in prominence of subarachnoid hemorrhage in the left parieto-occipital region. EXTUBATE 3./20 - Afebrile. Denies pain. Good UO    SUBJECTIVE/OVERNIGHT/INTERVAL HX 02/11/16 - refused PICC  On right side - unclear if she has capacity but she wont let IV team near her.. Left side flaccid and picc not pssible. CIR accept her but RN and notes indicate confusion.   VITAL SIGNS: BP 134/76 mmHg  Pulse 73  Temp(Src) 97.7 F (36.5 C) (Axillary)  Resp 18  Ht 5\' 5"  (1.651 m)  Wt 111.4 kg (245 lb 9.5 oz)  BMI  40.87 kg/m2  SpO2 96%  HEMODYNAMICS:    VENTILATOR SETTINGS:    INTAKE / OUTPUT: I/O last 3 completed shifts: In: 1088 [P.O.:415;  I.V.:423; IV Piggyback:250] Out: 125 [Urine:125]  PHYSICAL EXAMINATION: Gen. Obese, comfortable in bed ENT -  no post nasal drip, some dysarthria,  Neck: No JVD, no stridor Lungs: no use of accessory muscles,  Cardiovascular: Rhythm regular, heart sounds  normal, no murmurs, 1+ peripheral edema Abdomen: soft and non-tender, no hepatosplenomegaly, BS normal. Musculoskeletal: No deformities, no cyanosis or clubbing Neuro:  Left weakness. CONFUSED. CALM Skin:  Warm, no lesions/ rash  LABS:  PULMONARY  Recent Labs Lab 02/06/16 1700 02/07/16 0003  PHART 7.430 7.310*  PCO2ART 43.6 59.3*  PO2ART 84.5 379.0*  HCO3 28.4* 30.0*  TCO2 29.8 32  O2SAT 96.6 100.0    CBC  Recent Labs Lab 02/08/16 0338 02/09/16 1252 02/11/16 0439  HGB 10.9* 11.0* 11.3*  HCT 36.4 37.4 38.1  WBC 20.9* 16.1* 16.3*  PLT 454* 540* 583*    COAGULATION  Recent Labs Lab 02/07/16 0545  INR 1.32    CARDIAC   Recent Labs Lab 02/05/16 0322 02/05/16 0802 02/05/16 1448  TROPONINI 0.04* 0.04* 0.05*   No results for input(s): PROBNP in the last 168 hours.   CHEMISTRY  Recent Labs Lab 02/05/16 0322 02/07/16 0545 02/08/16 0338 02/11/16 0439  NA 134* 136 139 138  K 4.3 4.3 3.9 3.7  CL 99* 101 98* 97*  CO2 25 27 28 31   GLUCOSE 204* 180* 153* 163*  BUN 10 6 7 15   CREATININE 0.91 0.84 0.88 0.91  CALCIUM 10.2 10.0 10.4* 10.7*   Estimated Creatinine Clearance: 82.8 mL/min (by C-G formula based on Cr of 0.91).   LIVER  Recent Labs Lab 02/05/16 0322 02/07/16 0545  AST 15 11*  ALT 14 10*  ALKPHOS 76 67  BILITOT 0.7 0.2*  PROT 6.9 5.9*  ALBUMIN 2.8* 2.3*  INR  --  1.32     INFECTIOUS No results for input(s): LATICACIDVEN, PROCALCITON in the last 168 hours.   ENDOCRINE CBG (last 3)   Recent Labs  02/10/16 2148 02/11/16 0738 02/11/16 1110  GLUCAP 148* 149* 162*         IMAGING x48h  - image(s) personally visualized  -   highlighted in bold No results  found.     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  Also found to have hepatic steatosis.  On 3/16 she underwent emergent cerebral angiogram and clot retrieval from the R ICA artery and R MCA artery, returned from IR on the vent  ASSESSMENT / PLAN:  NEUROLOGIC A:   Acute ischemic stroke : R MCA and R ICA occlusion from emboli Subarachnoid hemorrhage on admission, ? From punctate emboli   - confused. Hypoactive delirium P:   Supportive care per Neurology recs Needs rehab Doubt can go to CIR with ongoing cnfusion  PULMONARY A: Acute respiratory failure on vent /sp extubation 3/16  P:   pulm hygiene  CARDIOVASCULAR A:  Acute R MCA ischemic stroke Mildly elevated troponin in setting of stroke Mitral valve regurgitation and bacterial endocarditis   -maintains bp/hr P:  BP goal per neurology SBP 120-140 Prn labetalol for hypertension Valve repair under evaluation per TCTS. The fallout from acute CVA will affect plan, timing, etc.   RENAL A: Mild hyponatremia-resolved P:   Monitor BMET  and UOP Replace electrolytes as needed   GASTROINTESTINAL A:   dysphagia P:   Pepcid for stress ulcer prophylaxis dys 1 diet  HEMATOLOGIC A:   No acute issues P:  Monitor for bleeding SCD given subarachnoid hemorrhage and new ischemic stroke  INFECTIOUS A:   Spontaneous bacterial endocarditis with strep viridans P:   Continue ampicillin - WBC decreasing F/u ID recs  ENDOCRINE A:   Hyperglycemia P:   Target glucose 140-180 with acute ischemic stroke   FAMILY  - Updates: Husband at bedside 3/16 pm. 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  TCTS following tosee  whether she is a candidate for eventual mitral valve replacement.   [facility] - d/w Dr Sherral Hammers. She has ongoing deliri8um and I am not sure she is ready for inpatient rehab 02/12/16      Dr. Brand Males, M.D., [facility].C.P Pulmonary and Critical Care Medicine Staff Physician [facility] Pager: [phone], If no answer or between  15:00h - 7:00h: call [phone]  02/11/2016 2:20 PM
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
## 2016-02-11 NOTE — Clinical Social Work Note (Signed)
CSW unable to communicate with patient at bedside due to confusion. CSW has left messages for patient's husband requesting call back regarding discharge planning.   Liz Beach MSW, [place], [place], [id number]

## 2016-02-11 NOTE — Progress Notes (Signed)
02/11/16 1500  OT Visit Information  Last OT Received On 02/11/16  Assistance Needed +2  PT/OT/SLP Co-Evaluation/Treatment Yes  Reason for Co-Treatment For patient/therapist safety;Complexity of the patient's impairments (multi-system involvement)  OT goals addressed during session Strengthening/ROM  History of Present Illness pt initiall presented with L Parieto-Occipital small SAH and at least 4 punctate watershed infarcts.  On 3/16 pt sustained a fall with initial f/u CT negative, but then began to have L sided deficits and stat CT revealed new R MCA Infarct and pt is now s/p R MCA and R ICA Revascularization and clot retrieval.  pt intubated 3/16-3/18.  pt with hx of HTN, COPD, Asthma, DM, Anemia, Hiatal Hernia, Migraines, Panic Attacks, CHF, Pulmonary HTN, Splenic Infarct, SAH, and CVA.    OT Time Calculation  OT Start Time (ACUTE ONLY) 1131  OT Stop Time (ACUTE ONLY) 1223  OT Time Calculation (min) 52 min  Precautions  Precautions Fall  Precaution Comments dense L hemiplegia  Pain Assessment  Pain Assessment Faces  Faces Pain Scale 6  Pain Location R UE  Pain Descriptors / Indicators Grimacing  Pain Intervention(s) Monitored during session;Repositioned  Cognition  Arousal/Alertness Awake/alert  Behavior During Therapy WFL for tasks assessed/performed;Agitated  Overall Cognitive Status Impaired/Different from baseline  Area of Impairment Attention;Following commands;Safety/judgement;Awareness;Problem solving;Memory  Current Attention Level Selective  Memory Decreased short-term memory  Following Commands Follows one step commands with increased time  Safety/Judgement Decreased awareness of safety;Decreased awareness of deficits  Awareness Emergent  Problem Solving Slow processing;Decreased initiation;Difficulty sequencing;Requires verbal cues;Requires tactile cues  General Comments Pt easily distracted by her own thoughts and in busy environment.  ADL  Overall ADL's  Needs  assistance/impaired  Eating/Feeding Minimal assistance;Sitting  Eating/Feeding Details (indicate cue type and reason) assist to set up tray, increased spillage and loss of food from mouth  Lower Body Dressing Total assistance;Bed level  Toilet Transfer Details (indicate cue type and reason) total for bed pan use  Toileting- Clothing Manipulation and Hygiene Total assistance;Bed level  Toileting - Clothing Manipulation Details (indicate cue type and reason) pt aware of need to urinate  Bed Mobility  Overal bed mobility Needs Assistance;+2 for physical assistance  Bed Mobility Rolling;Sit to Supine  Rolling Mod assist;Total assist;+2 for physical assistance (mod to L, total +2 to R)  Sidelying to sit Total assist;+2 for physical assistance  General bed mobility comments Pt assisted for all aspects.  Balance  Sitting balance-Leahy Scale Zero  Sitting balance - Comments pt tends to lean to L side, but with cueing does attempt to A with correcting balance.    Standing balance-Leahy Scale Zero  Vision- Assessment  Additional Comments Worked on locating gross visual targets in L hemispace in supine and sitting.  Transfers  Overall transfer level Needs assistance  Equipment used None  Transfers Stand Pivot Transfers  Squat pivot transfers Total assist;+2 physical assistance  General transfer comment bed to chair toward R side, pt minimally assisting  OT - End of Session  Equipment Utilized During Treatment Gait belt;Oxygen  Activity Tolerance Patient limited by fatigue  Patient left in chair;with call bell/phone within reach;with nursing/sitter in room  Nurse Communication Need for lift equipment  OT Assessment/Plan  OT Plan Discharge plan remains appropriate  OT Frequency (ACUTE ONLY) Min 3X/week  Follow Up Recommendations CIR;Supervision/Assistance - 24 hour  OT Equipment 3 in 1 bedside comode;Tub/shower bench  OT Goal Progression  Progress towards OT goals Progressing toward goals   Acute Rehab OT Goals  Patient  Stated Goal to eat  OT General Charges  $OT Visit 1 Procedure  OT Treatments  $Therapeutic Activity 23-37 mins  02/11/2016 Nestor Lewandowsky, OTR/L Pager: [phone]

## 2016-02-11 NOTE — Progress Notes (Signed)
Attempt to place PICC.  Phone onsent from husband.  Explained procedure to patient but pt. refusing PICC and will not hold arm out for examination; withdrawing arm..  Will not cooperate.  Dr. Chase Caller on floor and informed.  Pt. states can use left arm with is flaccid due to stroke.  Left arm unsuitable for PICC placement.  Floor RN notified.

## 2016-02-11 NOTE — NC FL2 (Signed)
[place] LEVEL OF CARE SCREENING TOOL     IDENTIFICATION  Patient Name: Margaret [name] Birthdate: 1955/12/23 Sex: female Admission Date (Current Location): 02/01/2016  [facility] and [place] Number:  Herbalist and Address:  [facility] [place], [place] [address], [place], [place] [address]      Provider Number: [id number]  Attending Physician Name and Address:  Rigoberto Noel, MD  Relative Name and Phone Number:       Current Level of Care: Hospital Recommended Level of Care: [place] Prior Approval Number:    Date Approved/Denied:   PASRR Number: [id number]  Discharge Plan: SNF    Current Diagnoses: Patient Active Problem List   Diagnosis Date Noted  . Tachypnea   . Acute blood loss anemia   . Right carotid artery occlusion   . Acute respiratory failure with hypoxemia ([place])   . Epigastric abdominal tenderness on direct palpation   . Bacterial endocarditis   . Positive ANA (antinuclear antibody)   . Streptococcus viridans infection 02/04/2016  . Bacteremia   . Cerebrovascular accident (CVA) due to embolism of cerebral artery ([place])   . Subarachnoid hemorrhage ([place]) 02/01/2016  . Leukocytosis 02/01/2016  . Diarrhea 02/01/2016  . Hyponatremia 02/01/2016  . Subarachnoid bleed ([place]) 02/01/2016  . Stroke ([place])   . Visual changes   . Pulmonary infiltrates 01/23/2016  . Pulmonary hypertension assoc with unclear multi-factorial mechanisms ([place]) 01/23/2016  . 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 Insulin dependent diabetes mellitus ([place]) 01/14/2016  . Essential hypertension 01/14/2016  . Hepatic cirrhosis ([place]) 01/14/2016  . Nausea & vomiting 01/14/2016  . Splenic infarct 01/14/2016  . Upper abdominal pain   . COPD (chronic obstructive pulmonary disease) ([place]) 04/27/2012  . Healthcare-associated pneumonia 04/27/2012  . Hemoptysis 04/27/2012  . Chronic diastolic CHF (congestive heart failure) ([place]) 04/27/2012  .  Morbid obesity ([place]) 04/27/2012  . Hypoxia 04/27/2012  . Dyspnea on exertion 04/27/2012  . Smoker 04/27/2012  . HTN (hypertension) 04/20/2012  . DM (diabetes mellitus) ([place]) 04/20/2012    Orientation RESPIRATION BLADDER Height & Weight     Self, Time, Situation, Place  O2 (1L) Continent Weight: 111.4 kg (245 lb 9.5 oz) Height:  5\' 5"  (165.1 cm)  BEHAVIORAL SYMPTOMS/MOOD NEUROLOGICAL BOWEL NUTRITION STATUS   (NONE)  (NONE) Continent Diet  AMBULATORY STATUS COMMUNICATION OF NEEDS Skin   Extensive Assist Verbally Normal                       Personal Care Assistance Level of Assistance  Bathing, Dressing, Feeding Bathing Assistance: Limited assistance Feeding assistance: Limited assistance Dressing Assistance: Limited assistance     Functional Limitations Info  Sight, Hearing, Speech Sight Info: Adequate Hearing Info: Adequate Speech Info: Adequate    SPECIAL CARE FACTORS FREQUENCY  PT (By licensed PT), OT (By licensed OT), Speech therapy     PT Frequency: 5/day OT Frequency: 5/day     Speech Therapy Frequency: 5/day      Contractures Contractures Info: Not present    Additional Factors Info  Allergies, Code Status, Insulin Sliding Scale Code Status Info: Full Allergies Info: Codeine, Latex   Insulin Sliding Scale Info: 3/day       Current Medications (02/11/2016):  This is the current hospital active medication list Current Facility-Administered Medications  Medication Dose Route Frequency Provider Last Rate Last Dose  . acetaminophen (TYLENOL) tablet 1,000 mg  1,000 mg Oral Q6H PRN Luanne Bras, MD  1,000 mg at 02/09/16 1800   Or  . acetaminophen (TYLENOL) suppository 650 mg  650 mg Rectal Q6H PRN Luanne Bras, MD      . albuterol (PROVENTIL) (2.5 MG/3ML) 0.083% nebulizer solution 2.5 mg  2.5 mg Nebulization QHS Toy Baker, MD   2.5 mg at 02/10/16 2105  . albuterol (PROVENTIL) (2.5 MG/3ML) 0.083% nebulizer solution 2.5 mg  2.5 mg  Nebulization Q2H PRN Juanito Doom, MD      . ampicillin (OMNIPEN) 2 g in sodium chloride 0.9 % 50 mL IVPB  2 g Intravenous Q4H Kara Mead V, MD   2 g at 02/11/16 1631  . antiseptic oral rinse (CPC / CETYLPYRIDINIUM CHLORIDE 0.05%) solution 7 mL  7 mL Mouth Rinse q12n4p Collene Gobble, MD   7 mL at 02/11/16 1600  . bisacodyl (DULCOLAX) suppository 10 mg  10 mg Rectal Daily PRN Kara Mead V, MD      . chlorhexidine (PERIDEX) 0.12 % solution 15 mL  15 mL Mouth Rinse BID Collene Gobble, MD   15 mL at 02/11/16 0958  . famotidine (PEPCID) tablet 20 mg  20 mg Oral BID Rigoberto Noel, MD   20 mg at 02/11/16 0958  . feeding supplement (ENSURE ENLIVE) (ENSURE ENLIVE) liquid 237 mL  237 mL Oral TID BM Ardeen Garland, RD   237 mL at 02/11/16 1800  . furosemide (LASIX) tablet 40 mg  40 mg Oral Daily Rigoberto Noel, MD   40 mg at 02/11/16 0958  . HYDROcodone-acetaminophen (NORCO/VICODIN) 5-325 MG per tablet 1 tablet  1 tablet Oral Q4H PRN Domenic Polite, MD   1 tablet at 02/11/16 1550  . hydrOXYzine (ATARAX/VISTARIL) tablet 25 mg  25 mg Oral TID PRN Velvet Bathe, MD   25 mg at 02/11/16 0447  . insulin aspart (novoLOG) injection 0-9 Units  0-9 Units Subcutaneous TID WC Velvet Bathe, MD   1 Units at 02/11/16 1631  . isometheptene-acetaminophen-dichloralphenazone (MIDRIN) capsule 1 capsule  1 capsule Oral QID PRN Garvin Fila, MD      . labetalol (NORMODYNE,TRANDATE) injection 10-40 mg  10-40 mg Intravenous Q10 min PRN Juanito Doom, MD      . ondansetron (ZOFRAN) injection 4 mg  4 mg Intravenous Q6H PRN Luanne Bras, MD      . pravastatin (PRAVACHOL) tablet 20 mg  20 mg Oral q1800 Toy Baker, MD   20 mg at 02/11/16 1631  . sodium chloride flush (NS) 0.9 % injection 3 mL  3 mL Intravenous Q12H Verlee Monte, MD   3 mL at 02/10/16 1059  . sodium chloride flush (NS) 0.9 % injection 3 mL  3 mL Intravenous PRN Verlee Monte, MD         Discharge Medications: Please see discharge summary for  a list of discharge medications.  Relevant Imaging Results:  Relevant Lab Results:   Additional Information SSN: [id number]. Per husband, patient has applied for Medicaid.   Rigoberto Noel, LCSW

## 2016-02-12 ENCOUNTER — Inpatient Hospital Stay (HOSPITAL_COMMUNITY): Payer: Medicaid Other

## 2016-02-12 ENCOUNTER — Encounter (HOSPITAL_COMMUNITY): Payer: Self-pay | Admitting: Dentistry

## 2016-02-12 DIAGNOSIS — J9600 Acute respiratory failure, unspecified whether with hypoxia or hypercapnia: Secondary | ICD-10-CM

## 2016-02-12 DIAGNOSIS — I5033 Acute on chronic diastolic (congestive) heart failure: Secondary | ICD-10-CM

## 2016-02-12 LAB — COMPREHENSIVE METABOLIC PANEL
ALK PHOS: 81 U/L (ref 38–126)
ALT: 14 U/L (ref 14–54)
ANION GAP: 12 (ref 5–15)
AST: 16 U/L (ref 15–41)
Albumin: 2.4 g/dL — ABNORMAL LOW (ref 3.5–5.0)
BUN: 14 mg/dL (ref 6–20)
CALCIUM: 10.6 mg/dL — AB (ref 8.9–10.3)
CO2: 30 mmol/L (ref 22–32)
Chloride: 98 mmol/L — ABNORMAL LOW (ref 101–111)
Creatinine, Ser: 0.91 mg/dL (ref 0.44–1.00)
GFR calc non Af Amer: >60 mL/min (ref >60)
Glucose, Bld: 177 mg/dL — ABNORMAL HIGH (ref 65–99)
POTASSIUM: 3.9 mmol/L (ref 3.5–5.1)
SODIUM: 140 mmol/L (ref 135–145)
Total Bilirubin: 0.4 mg/dL (ref 0.3–1.2)
Total Protein: 6.3 g/dL — ABNORMAL LOW (ref 6.5–8.1)

## 2016-02-12 LAB — CBC
HCT: 37.4 % (ref 36.0–46.0)
HEMOGLOBIN: 11.2 g/dL — AB (ref 12.0–15.0)
MCH: 27.1 pg (ref 26.0–34.0)
MCHC: 29.9 g/dL — ABNORMAL LOW (ref 30.0–36.0)
MCV: 90.6 fL (ref 78.0–100.0)
Platelets: 571 10*3/uL — ABNORMAL HIGH (ref 150–400)
RBC: 4.13 MIL/uL (ref 3.87–5.11)
RDW: 15.2 % (ref 11.5–15.5)
WBC: 16.5 10*3/uL — AB (ref 4.0–10.5)

## 2016-02-12 LAB — BASIC METABOLIC PANEL
ANION GAP: 8 (ref 5–15)
BUN: 10 mg/dL (ref 6–20)
CHLORIDE: 99 mmol/L — AB (ref 101–111)
CO2: 32 mmol/L (ref 22–32)
Calcium: 10.5 mg/dL — ABNORMAL HIGH (ref 8.9–10.3)
Creatinine, Ser: 0.94 mg/dL (ref 0.44–1.00)
GFR calc Af Amer: >60 mL/min (ref >60)
GFR calc non Af Amer: >60 mL/min (ref >60)
Glucose, Bld: 151 mg/dL — ABNORMAL HIGH (ref 65–99)
POTASSIUM: 4.1 mmol/L (ref 3.5–5.1)
SODIUM: 139 mmol/L (ref 135–145)

## 2016-02-12 LAB — URINE CULTURE

## 2016-02-12 LAB — GLUCOSE, CAPILLARY
GLUCOSE-CAPILLARY: 150 mg/dL — AB (ref 65–99)
GLUCOSE-CAPILLARY: 149 mg/dL — AB (ref 65–99)
GLUCOSE-CAPILLARY: 146 mg/dL — AB (ref 65–99)
Glucose-Capillary: 182 mg/dL — ABNORMAL HIGH (ref 65–99)

## 2016-02-12 LAB — PROTIME-INR
INR: 1.2 (ref 0.00–1.49)
Prothrombin Time: 15.4 seconds — ABNORMAL HIGH (ref 11.6–15.2)

## 2016-02-12 LAB — BRAIN NATRIURETIC PEPTIDE: B Natriuretic Peptide: 137.3 pg/mL — ABNORMAL HIGH (ref 0.0–100.0)

## 2016-02-12 LAB — APTT: APTT: 29 s (ref 24–37)

## 2016-02-12 LAB — PREALBUMIN: Prealbumin: 14.3 mg/dL — ABNORMAL LOW (ref 18–38)

## 2016-02-12 MED ORDER — FUROSEMIDE 10 MG/ML IJ SOLN
40.0000 mg | Freq: Once | INTRAMUSCULAR | Status: AC
  Administered 2016-02-12: 40 mg via INTRAVENOUS
  Filled 2016-02-12: qty 4

## 2016-02-12 NOTE — Progress Notes (Addendum)
    [facility] for Infectious Disease   Reason for visit: Follow up on TV endocarditis  Interval History: now in medical bed.  Going to SNF when stable. Unable to get more history.   Physical Exam: Constitutional:  Filed Vitals:   02/11/16 2102 02/12/16 0616  BP: 135/71 130/53  Pulse: 80 71  Temp: 97.6 F (36.4 C) 98.1 F (36.7 C)  Resp: 22 19   patient appears in NAD; alert, slowed and can only say one word at a time Respiratory: Normal respiratory effort; CTA B Cardiovascular: RRR GI: soft, nt  Review of Systems: Unable to be assessed due to mental status  Lab Results  Component Value Date   WBC 16.5* 02/12/2016   HGB 11.2* 02/12/2016   HCT 37.4 02/12/2016   MCV 90.6 02/12/2016   PLT 571* 02/12/2016    Lab Results  Component Value Date   CREATININE 0.91 02/12/2016   BUN 14 02/12/2016   NA 140 02/12/2016   K 3.9 02/12/2016   CL 98* 02/12/2016   CO2 30 02/12/2016    Lab Results  Component Value Date   ALT 14 02/12/2016   AST 16 02/12/2016   ALKPHOS 81 02/12/2016     Microbiology: Recent Results (from the past 240 hour(s))  Culture, blood (routine x 2)     Status: None   Collection Time: 02/04/16 12:36 PM  Result Value Ref Range Status   Specimen Description BLOOD  Final   Special Requests IN PEDIATRIC BOTTLE 3CC  Final   Culture NO GROWTH 5 DAYS  Final   Report Status 02/09/2016 FINAL  Final  Culture, blood (routine x 2)     Status: None   Collection Time: 02/04/16 12:48 PM  Result Value Ref Range Status   Specimen Description BLOOD  Final   Special Requests IN PEDIATRIC BOTTLE 3CC  Final   Culture NO GROWTH 5 DAYS  Final   Report Status 02/09/2016 FINAL  Final    Impression/Plan:  1. Strep viridans native mitral valve endocarditis - also with septic emboli to spleen,  CNS, possible SAH.  On ampicillin.  Followed by CT surgery and may need valve replacement. Treat through April 24th.    picc line ok when able  2. Leukocytosis - stable  3.  Respiratory failure - stable off of O2.  I will sign off, we will follow after discharge in about 2 weeks, preferably after cardiac surgery outpatient visit.

## 2016-02-12 NOTE — Consult Note (Signed)
DENTAL CONSULTATION  Date of Consultation:  02/12/2016 Patient Name:   Margaret [name] Date of Birth:   1956-06-27 Medical Record Number: [id number]  VITALS: BP 130/53 mmHg  Pulse 71  Temp(Src) 98.1 F (36.7 C) (Oral)  Resp 19  Ht 5\' 5"  (1.651 m)  Wt 236 lb 1.8 oz (107.1 kg)  BMI 39.29 kg/m2  SpO2 99%  CHIEF COMPLAINT: Patient referred by Dr. Roxy Manns for a dental consultation.  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 Patient with anticipated mitral valve replacement or repair in the future. Patient now seen as part of a medically necessary pre-heart valve surgery dental protocol examination.  The patient currently denies acute toothaches, swellings, or abscesses. Patient was last seen"years ago"by a dentist. Patient can not remember what she had done at that time. Patient indicates that she has an upper complete denture and lower partial denture. Patient indicates that they "fit fine".  Patient is not wearing the upper complete or lower partial denture at this time. Patient denies having any dental phobia.  PROBLEM LIST: Patient Active Problem List   Diagnosis Date Noted  . Tachypnea   . Acute blood loss anemia   . Right carotid artery occlusion   . Acute respiratory failure with hypoxemia ([place])   . Epigastric abdominal tenderness on direct palpation   . Bacterial endocarditis   . Positive ANA (antinuclear antibody)   . Streptococcus viridans infection 02/04/2016  . Bacteremia   . Cerebrovascular accident (CVA) due to embolism of cerebral artery ([place])   . Subarachnoid hemorrhage ([place]) 02/01/2016  . Leukocytosis 02/01/2016  . Diarrhea 02/01/2016  . Hyponatremia 02/01/2016  . Subarachnoid bleed ([place]) 02/01/2016  . Stroke ([place])   . Visual changes   . Pulmonary infiltrates 01/23/2016  . Pulmonary hypertension assoc with unclear multi-factorial mechanisms ([place])  01/23/2016  . 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 Insulin dependent diabetes mellitus ([place]) 01/14/2016  . Essential hypertension 01/14/2016  . Hepatic cirrhosis ([place]) 01/14/2016  . Nausea & vomiting 01/14/2016  . Splenic infarct 01/14/2016  . Upper abdominal pain   . COPD (chronic obstructive pulmonary disease) ([place]) 04/27/2012  . Healthcare-associated pneumonia 04/27/2012  . Hemoptysis 04/27/2012  . Chronic diastolic CHF (congestive heart failure) ([place]) 04/27/2012  . Morbid obesity ([place]) 04/27/2012  . Hypoxia 04/27/2012  . Dyspnea on exertion 04/27/2012  . Smoker 04/27/2012  . HTN (hypertension) 04/20/2012  . DM (diabetes mellitus) ([place]) 04/20/2012    PMH: Past Medical History  Diagnosis Date  . Hypertension   . Hyperlipidemia   . COPD (chronic obstructive pulmonary disease) ([place]) 04/20/12  . Asthma   . Type II diabetes mellitus ([place])   . Anemia   . H/O hiatal hernia   . Osteoarthritis   . Pneumonia 04/2012  . Migraines   . Panic attacks 04/20/12  . Morbid obesity ([place])   . Heart murmur   . Bacterial endocarditis     Strep viridans   . Chronic diastolic CHF (congestive heart failure) ([place]) 04/27/2012  . Pulmonary hypertension assoc with unclear multi-factorial mechanisms ([place]) 01/23/2016  . Positive ANA (antinuclear antibody)   . 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 Stroke ([place])   . Subarachnoid bleed ([place]) 02/01/2016    PSH: Past Surgical History  Procedure Laterality Date  . Laceration repair  1966    "right upper arm"  . Dilation and curettage of uterus  1990's  . Finger reattachment  1973    "right ring  finger"  . Tee without cardioversion N/A 02/05/2016    Procedure: TRANSESOPHAGEAL ECHOCARDIOGRAM (TEE);  [name]: Larey Dresser, MD;  Location: [facility];  Service: Cardiovascular;  Laterality: N/A;  . Radiology with anesthesia N/A 02/06/2016    Procedure: RADIOLOGY WITH ANESTHESIA;  [name]: Luanne Bras, MD;  Location: [facility];  Service: Radiology;  Laterality:  N/A;    ALLERGIES: Allergies  Allergen Reactions  . Codeine Hives, Itching and Other (See Comments)    "breathing problems"  . Latex Itching    MEDICATIONS: Current Facility-Administered Medications  Medication Dose Route Frequency Provider Last Rate Last Dose  . acetaminophen (TYLENOL) tablet 1,000 mg  1,000 mg Oral Q6H PRN Luanne Bras, MD   1,000 mg at 02/09/16 1800   Or  . acetaminophen (TYLENOL) suppository 650 mg  650 mg Rectal Q6H PRN Luanne Bras, MD      . albuterol (PROVENTIL) (2.5 MG/3ML) 0.083% nebulizer solution 2.5 mg  2.5 mg Nebulization QHS Toy Baker, MD   2.5 mg at 02/11/16 2021  . albuterol (PROVENTIL) (2.5 MG/3ML) 0.083% nebulizer solution 2.5 mg  2.5 mg Nebulization Q2H PRN Juanito Doom, MD      . ampicillin (OMNIPEN) 2 g in sodium chloride 0.9 % 50 mL IVPB  2 g Intravenous Q4H Kara Mead V, MD   2 g at 02/12/16 0848  . antiseptic oral rinse (CPC / CETYLPYRIDINIUM CHLORIDE 0.05%) solution 7 mL  7 mL Mouth Rinse q12n4p Collene Gobble, MD   7 mL at 02/11/16 1600  . bisacodyl (DULCOLAX) suppository 10 mg  10 mg Rectal Daily PRN Kara Mead V, MD      . chlorhexidine (PERIDEX) 0.12 % solution 15 mL  15 mL Mouth Rinse BID Collene Gobble, MD   15 mL at 02/12/16 0848  . famotidine (PEPCID) tablet 20 mg  20 mg Oral BID Rigoberto Noel, MD   20 mg at 02/12/16 0845  . feeding supplement (ENSURE ENLIVE) (ENSURE ENLIVE) liquid 237 mL  237 mL Oral TID BM Ardeen Garland, RD   237 mL at 02/11/16 1800  . furosemide (LASIX) tablet 40 mg  40 mg Oral Daily Kara Mead V, MD   40 mg at 02/12/16 0845  . HYDROcodone-acetaminophen (NORCO/VICODIN) 5-325 MG per tablet 1 tablet  1 tablet Oral Q4H PRN Domenic Polite, MD   1 tablet at 02/12/16 0845  . hydrOXYzine (ATARAX/VISTARIL) tablet 25 mg  25 mg Oral TID PRN Velvet Bathe, MD   25 mg at 02/12/16 0845  . insulin aspart (novoLOG) injection 0-9 Units  0-9 Units Subcutaneous TID WC Velvet Bathe, MD   2 Units at 02/12/16  0845  . isometheptene-acetaminophen-dichloralphenazone (MIDRIN) capsule 1 capsule  1 capsule Oral QID PRN Garvin Fila, MD      . labetalol (NORMODYNE,TRANDATE) injection 10-40 mg  10-40 mg Intravenous Q10 min PRN Juanito Doom, MD      . ondansetron (ZOFRAN) injection 4 mg  4 mg Intravenous Q6H PRN Luanne Bras, MD      . pravastatin (PRAVACHOL) tablet 20 mg  20 mg Oral q1800 Toy Baker, MD   20 mg at 02/11/16 1631  . sodium chloride flush (NS) 0.9 % injection 3 mL  3 mL Intravenous Q12H Verlee Monte, MD   3 mL at 02/10/16 1059  . sodium chloride flush (NS) 0.9 % injection 3 mL  3 mL Intravenous PRN Verlee Monte, MD        LABS: Lab Results  Component Value Date  WBC 16.5* 02/12/2016   HGB 11.2* 02/12/2016   HCT 37.4 02/12/2016   MCV 90.6 02/12/2016   PLT 571* 02/12/2016      Component Value Date/Time   NA 140 02/12/2016 0550   K 3.9 02/12/2016 0550   CL 98* 02/12/2016 0550   CO2 30 02/12/2016 0550   GLUCOSE 177* 02/12/2016 0550   BUN 14 02/12/2016 0550   CREATININE 0.91 02/12/2016 0550   CALCIUM 10.6* 02/12/2016 0550   GFRNONAA >60 02/12/2016 0550   GFRAA >60 02/12/2016 0550   Lab Results  Component Value Date   INR 1.20 02/12/2016   INR 1.32 02/07/2016   INR 2.16* 01/31/2016   No results found for: PTT  SOCIAL HISTORY: Social History   Social History  . Marital Status: Married    Spouse Name: N/A  . Number of Children: N/A  . Years of Education: N/A   Occupational History  . Not on file.   Social History Main Topics  . Smoking status: Former Smoker -- 0.20 packs/day for 40 years    Types: Cigarettes    Quit date: 04/20/2012  . Smokeless tobacco: Never Used     Comment: 6/5//13 "a pack of cigarettes would last me 1 - 1 1/2 wk"  . Alcohol Use: No  . Drug Use: No  . Sexual Activity: Not Currently     Comment: married, 2 children, work with computers   Other Topics Concern  . Not on file   Social History Narrative    FAMILY  HISTORY: Family History  Problem Relation Age of Onset  . Allergies    . Hypertension    . Coronary artery disease Mother 61  . Stroke Father   . COPD Father   . Clotting disorder Maternal Grandmother     died from blood clot    REVIEW OF SYSTEMS: Head and Neck: As per history of present illness.  Psych: Patient denies having dental phobia.   DENTAL HISTORY: CHIEF COMPLAINT: Patient referred by Dr. Roxy Manns for a dental consultation.  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 Patient with anticipated mitral valve replacement or repair in the future. Patient now seen as part of a medically necessary pre-heart valve surgery dental protocol examination.  The patient currently denies acute toothaches, swellings, or abscesses. Patient was last seen"years ago"by a dentist. Patient can not remember what she had done at that time. Patient indicates that she has an upper complete denture and lower partial denture. Patient indicates that they "fit fine".  Patient is not wearing the upper complete or lower partial denture at this time. Patient denies having any dental phobia.  DENTAL EXAMINATION: GENERAL:Patient is a well-developed, obese female in no acute distress.  HEAD AND NECK:There is no palpable submandibular lymphadenopathy. I cannot palpate any thyroid masses. Patient denies having any acute TMJ symptoms.  INTRAORAL EXAM: Patient has normal saliva. There is no evidence of oral abscess formation. There is atrophy of the edentulous alveolar ridges.  DENTITION:Patient is missing all maxillary teeth. Patient is also clinically missing tooth numbers 17 through 20, and 28 through 32.  PERIODONTAL:Patient has chronic periodontitis with plaque accumulations, gingival recession, and no significant tooth mobility of remaining teeth.  DENTAL CARIES/SUBOPTIMAL RESTORATIONS:No obvious dental  caries are noted.  ENDODONTIC:Patient denies acute pulpitis symptoms. Patient will need a dental radiograph to rule out periapical pathology or radiolucency.  CROWN AND BRIDGE: Patient has a crown on tooth #27  that appears to be acceptable.  PROSTHODONTIC:Patient has an upper complete denture and lower partial denture that "fits fine "by patient report. Patient is not wearing the dentures at this time and would not agree to insertion of the dentures for evaluation.  OCCLUSION:Unable to assess occlusion at this time.  RADIOGRAPHIC INTERPRETATION: An orthopantogram was ordered but unable to be taken at this time due to previous stroke. Will consider obtaining alternate radiographs in the future.  ASSESSMENTS: 1. Streptococcal viridans bacterial endocarditis 2. Severe mitral regurgitation with vegetation 3. History of multiple embolic strokes 4. Anticipated mitral valve replacement or repair the future 5. Pre-heart valve surgery dental protocol examination 6. Chronic periodontitis 7. Accretions 8. Multiple missing teeth 9. Upper complete and lower partial dentures by patient report 10. No obvious dental etiology for bacterial endocarditis pending obtaining of radiographs to rule out periapical pathology, radiolucencies, or impacted teeth.   PLAN/RECOMMENDATIONS: 1. I currently do not see any dental etiology for the streptococcal viridans bacterial endocarditis. Ideally, orthopantogram or other radiographs will need to be obtained to rule out periapical pathology,radiolucency, or impacted teeth as a potential source of bacterial endocarditis. Patient, however, has suffered an embolic stroke with significant hemiparesis preventing obtaining of dental radiographs at this time. Patient will be followed for resolution of stroke symptoms and potential ability to proceed with orthopantogram in the future. We will consult with radiology concerning obtaining mandible series or 3D CT scan images of the  maxilla and mandible. Will also attempt to identify current Dentist and presence of previous dental radiographs.   2. Discussion of findings with medical team and coordination of future medical and dental care as needed.    Lenn Cal, DDS

## 2016-02-12 NOTE — Progress Notes (Signed)
Physical Therapy Treatment Patient Details Name: Margaret [name] MRN: [id number] DOB: 10/23/1956 Today's Date: 02/12/2016    History of Present Illness pt initially presented with L Parieto-Occipital small SAH and at least 4 punctate watershed infarcts.  On 3/16 pt sustained a fall with initial f/u CT negative, but then began to have L sided deficits and stat CT revealed new R MCA Infarct and pt is now s/p R MCA and R ICA Revascularization and clot retrieval.  pt intubated 3/16-3/18.  pt with hx of HTN, COPD, Asthma, DM, Anemia, Hiatal Hernia, Migraines, Panic Attacks, CHF, Pulmonary HTN, Splenic Infarct, SAH, and CVA.      PT Comments    Pt continues to require 2 person A for all mobility and today was more difficult to encourage participation as pt very focused on pain on bottoms of feet.  Noted CIR declined pt, so updated D/C recommendations to SNF.  Will continue to follow.    Follow Up Recommendations  SNF     Equipment Recommendations  None recommended by PT    Recommendations for Other Services       Precautions / Restrictions Precautions Precautions: Fall Precaution Comments: dense L hemiplegia Restrictions Weight Bearing Restrictions: No    Mobility  Bed Mobility Overal bed mobility: Needs Assistance;+2 for physical assistance Bed Mobility: Rolling;Sidelying to Sit;Sit to Sidelying Rolling: Mod assist;+2 for physical assistance;Total assist (mod to L, +2 total to R) Sidelying to sit: Total assist;+2 for physical assistance     Sit to sidelying: +2 for safety/equipment;Total assist General bed mobility comments: +2 total assist to pull up in bed and position  Transfers                    Ambulation/Gait                 Stairs            Wheelchair Mobility    Modified Rankin (Stroke Patients Only) Modified Rankin (Stroke Patients Only) Pre-Morbid Rankin Score: No symptoms Modified Rankin: Severe disability     Balance Overall  balance assessment: Needs assistance Sitting-balance support: Feet supported Sitting balance-Leahy Scale: Zero Sitting balance - Comments: assisted varies from min to moderate, sat EOB x approximately 12 minutes Postural control: Left lateral lean                          Cognition Arousal/Alertness: Lethargic (improved alertness at EOB) Behavior During Therapy: Flat affect;Agitated Overall Cognitive Status: Impaired/Different from baseline Area of Impairment: Attention;Following commands;Safety/judgement;Awareness;Problem solving;Memory   Current Attention Level: Selective Memory: Decreased short-term memory Following Commands: Follows one step commands with increased time Safety/Judgement: Decreased awareness of safety;Decreased awareness of deficits   Problem Solving: Slow processing;Decreased initiation;Difficulty sequencing;Requires verbal cues;Requires tactile cues General Comments: Pt easily frustrated when staff not understanding her needs and asking her to repeat.    Exercises      General Comments        Pertinent Vitals/Pain Pain Assessment: Faces Faces Pain Scale: Hurts whole lot Pain Location: bottoms of feet Pain Descriptors / Indicators: Guarding;Grimacing Pain Intervention(s): Monitored during session;Repositioned;Patient requesting pain meds-RN notified    Home Living                      Prior Function            PT Goals (current goals can now be found in the care plan section) Acute Rehab PT Goals  Patient Stated Goal: did not state PT Goal Formulation: With patient Time For Goal Achievement: 02/24/16 Potential to Achieve Goals: Fair Progress towards PT goals: Progressing toward goals    Frequency  Min 3X/week    PT Plan Discharge plan needs to be updated;Frequency needs to be updated    Co-evaluation PT/OT/SLP Co-Evaluation/Treatment: Yes Reason for Co-Treatment: Complexity of the patient's impairments (multi-system  involvement);For patient/therapist safety PT goals addressed during session: Mobility/safety with mobility;Balance       End of Session   Activity Tolerance: Patient limited by fatigue;Patient limited by pain Patient left: in bed;with call bell/phone within reach;with bed alarm set     Time: 0826-0905 PT Time Calculation (min) (ACUTE ONLY): 39 min  Charges:  $Therapeutic Activity: 8-22 mins                    G CodesCatarina [name], Margaret [name] 02/12/2016, 2:14 PM

## 2016-02-12 NOTE — Progress Notes (Signed)
PROGRESS NOTE  Margaret [name] [id number] DOB: 1956-04-07 DOA: 02/01/2016 PCP: Harvie Junior, MD Outpatient Specialists:    LOS: 11 days   Brief Narrative: 60 year old female with past medical history of hypertension, hyperlipidemia, COPD, diabetes mellitus, and CHF. She recently admitted in February of this year for acute splenic infarction without clear etiology. She was admitted on 3/11 with fever, headache and a small subarachnoid bleed as well as several small "punctate infarcts" consistent with embolic events. Blood cultures were positive for strep viridans at that point.  On 3/16 she had a mechanical fall and had a CT head that was read as normal around 1400. Apparently she was given ativan. After return from CT imaging she was noted to have left facial droop, slurred speech around 4PM. Stroke was called, CT head repeated, then IR was consulted for possible cerebral angiogram. She underwent an angiogram showing a R MCA embolism and occluded right internal carotid artery embolism. These were retrieved and then she underwent selective intra-arterial integrilin infusion.   Assessment & Plan: Principal Problem:   Bacterial endocarditis Active Problems:   HTN (hypertension)   COPD (chronic obstructive pulmonary disease) (HCC)   Chronic diastolic CHF (congestive heart failure) (HCC)   Morbid obesity (HCC)   Insulin dependent diabetes mellitus (HCC)   Essential hypertension   Hepatic cirrhosis (HCC)   Splenic infarct   Pulmonary hypertension assoc with unclear multi-factorial mechanisms (HCC)   Subarachnoid hemorrhage (HCC)   Leukocytosis   Diarrhea   Hyponatremia   Subarachnoid bleed (HCC)   Stroke (HCC)   Visual changes   Bacteremia   Cerebrovascular accident (CVA) due to embolism of cerebral artery (HCC)   Streptococcus viridans infection   Epigastric abdominal tenderness on direct palpation   Positive ANA (antinuclear antibody)   Acute respiratory failure  with hypoxemia (HCC)   Right carotid artery occlusion   Tachypnea   Acute blood loss anemia   Acute CVA - due to right ICA acute occlusion s/p endovascular thrombectomy of terminal RICA occlusion   SAH within the left parieto-occipital region, and 4 punctate infarcts at the watershed area including left MCA/PCA, bilateral MCA/ACA - likely due to endocarditis - antibiotics per ID  Strep viridans MV endocarditis with regurgitation - on Ampicillin, needs IV antibiotics until 03/16/16 - PICC Line pending, initially refused placement, now agreeable - TCV consulted, will eventually need valve repair - dental consulted, appreciate input  SAH - was on Xarelto on admission due to splenic infarct prior hospitalization - off anticoagulation now  Acute respiratory failure - extubated now, on 2L [place], stable  DM - poorly controlled - continue SSI  HTN - pressures normal, avoid hypotension, continue to hold home medications  HLD - continue statin  DVT prophylaxis: SCD Code Status: Full Family Communication: no family bedside Disposition Plan: SNF when ready  Barriers for discharge: dental evaluation, PICC LIne  Consultants:   TCV  Dentistry  PCCM  Procedures:  CULTURES: 3/11 blood > viridans strep 3/14 blood > negative  ANTIBIOTICS: 3/12 Ceftriaxone > 3/16 3/16 Ampicillin >    STUDIES/eVENTS 02/01/2016 - admit:  3/11 CT head > localized subarachnoid hemorrhage/debris in left parietal occipital region 3/11 CT angiogram> mild intracranial and extracranial atherosclerosis without stenosis, patent dural venous sinuses, no vascular malformation 3/11 MRI/MRA brain > small volume subarachnoid hemorrhage/debri left parieto-occipital region, few sub cm cortical subcortical ischemic infarcts in bilateral parietal lobes, negative MRA 3/14 EEG > no evidence of epileptiform discharges 3/15 TEE> 2 cm mitral valve vegetation with  regurgitation 3/15 CT ab/pelvis> new splenic  infarct, larger than before, enlarged spleen, diffuse hepatic steatosis, mosaic attenuation of lung bases, consider pulmonary arterial hypertension 3/16 CT head > No significant change from prior 3/16 CT head> decreased conspicuity of left parieto-occipital subarachnoid hemorrhage; ? New area of hemorrhage R parietal sulcus. ACUTE STROKE. INTUBATE 3/16 IR angiogram> R internal carotid and R MCA occlusion 3/16 CT head > unchanged small volume R parietal and left parieto occipital subarachnoid hemorrhage 3/17 Head Ct > interval decrease small vol R parietal and L parietal-occipital bleed, no new bleed, no acute infarct yet 3/18 MRI brain >> patchy multifocal right MCA infarct, scattered parenchymal hemorrhage, probable new left parietal cortical infarct, subacute left cerebellar hemisphere CVA, slight interval increase in prominence of subarachnoid hemorrhage in the left parieto-occipital region. EXTUBATE 3./20 - Afebrile. Denies pain. Good UO 3/22 TRH took over  Antimicrobials: 3/12 Ceftriaxone > 3/16 3/16 Ampicillin >    Subjective: - denies chest pain, dyspnea, no abdominal pain, nausea/vomiting.   Objective: Filed Vitals:   02/11/16 2021 02/11/16 2102 02/12/16 0616 02/12/16 0644  BP:  135/71 130/53   Pulse: 78 80 71   Temp:  97.6 F (36.4 C) 98.1 F (36.7 C)   TempSrc:  Oral Oral   Resp: 18 22 19    Height:      Weight:    107.1 kg (236 lb 1.8 oz)  SpO2: 98% 100% 99%     Intake/Output Summary (Last 24 hours) at 02/12/16 1156 Last data filed at 02/11/16 2339  Gross per 24 hour  Intake    688 ml  Output    375 ml  Net    313 ml   Filed Weights   02/06/16 2200 02/11/16 0356 02/12/16 0644  Weight: 123.2 kg (271 lb 9.7 oz) 111.4 kg (245 lb 9.5 oz) 107.1 kg (236 lb 1.8 oz)    Examination: BP 130/53 mmHg  Pulse 71  Temp(Src) 98.1 F (36.7 C) (Oral)  Resp 19  Ht 5\' 5"  (1.651 m)  Wt 107.1 kg (236 lb 1.8 oz)  BMI 39.29 kg/m2  SpO2 99%  GENERAL: NAD  HEENT: head NCAT,  no scleral icterus. Pupils round and reactive.  NECK: Supple. No carotid bruits. No lymphadenopathy or thyromegaly.  LUNGS: Clear to auscultation. No wheezing or crackles  HEART: Regular rate and rhythm without murmur. 2+ pulses, no JVD, no peripheral edema  ABDOMEN: Soft, nontender, and nondistended. Positive bowel sounds.   EXTREMITIES: Without any cyanosis or clubbing  NEUROLOGIC: dense left hemiplegia, left sided neglect. Slurred speech   Data Reviewed: I have personally reviewed following labs and imaging studies  CBC:  Recent Labs Lab 02/07/16 0545 02/08/16 0338 02/09/16 1252 02/11/16 0439 02/12/16 0550  WBC 24.5* 20.9* 16.1* 16.3* 16.5*  NEUTROABS  --   --  13.0*  --   --   HGB 10.4* 10.9* 11.0* 11.3* 11.2*  HCT 34.9* 36.4 37.4 38.1 37.4  MCV 89.3 89.9 90.3 89.0 90.6  PLT 437* 454* 540* 583* [id number]*   Basic Metabolic Panel:  Recent Labs Lab 02/07/16 0545 02/08/16 0338 02/11/16 0439 02/12/16 0550  NA 136 139 138 140  K 4.3 3.9 3.7 3.9  CL 101 98* 97* 98*  CO2 27 28 31 30   GLUCOSE 180* 153* 163* 177*  BUN 6 7 15 14   CREATININE 0.84 0.88 0.91 0.91  CALCIUM 10.0 10.4* 10.7* 10.6*   GFR: Estimated Creatinine Clearance: 80.9 mL/min (by C-G formula based on Cr of 0.91). Liver Function Tests:  Recent Labs Lab 02/07/16 0545 02/12/16 0550  AST 11* 16  ALT 10* 14  ALKPHOS 67 81  BILITOT 0.2* 0.4  PROT 5.9* 6.3*  ALBUMIN 2.3* 2.4*   No results for input(s): LIPASE, AMYLASE in the last 168 hours.  Recent Labs Lab 02/06/16 1555  AMMONIA 29   Coagulation Profile:  Recent Labs Lab 02/07/16 0545 02/12/16 0550  INR 1.32 1.20   Cardiac Enzymes:  Recent Labs Lab 02/05/16 1448  TROPONINI 0.05*   BNP (last 3 results) No results for input(s): PROBNP in the last 8760 hours. HbA1C: No results for input(s): HGBA1C in the last 72 hours. CBG:  Recent Labs Lab 02/11/16 1110 02/11/16 1620 02/11/16 2112 02/12/16 0732 02/12/16 1110  GLUCAP 162*  142* 196* 182* 150*   Lipid Profile: No results for input(s): CHOL, HDL, LDLCALC, TRIG, CHOLHDL, LDLDIRECT in the last 72 hours. Thyroid Function Tests: No results for input(s): TSH, T4TOTAL, FREET4, T3FREE, THYROIDAB in the last 72 hours. Anemia Panel: No results for input(s): VITAMINB12, FOLATE, FERRITIN, TIBC, IRON, RETICCTPCT in the last 72 hours. Urine analysis:    Component Value Date/Time   COLORURINE YELLOW 02/11/2016 0842   APPEARANCEUR HAZY* 02/11/2016 0842   LABSPEC 1.028 02/11/2016 0842   PHURINE 6.5 02/11/2016 0842   GLUCOSEU NEGATIVE 02/11/2016 0842   HGBUR TRACE* 02/11/2016 0842   BILIRUBINUR NEGATIVE 02/11/2016 0842   KETONESUR NEGATIVE 02/11/2016 0842   PROTEINUR NEGATIVE 02/11/2016 0842   UROBILINOGEN 0.2 04/27/2012 2313   NITRITE NEGATIVE 02/11/2016 0842   LEUKOCYTESUR TRACE* 02/11/2016 0842   Sepsis Labs: Invalid input(s): PROCALCITONIN, LACTICIDVEN  Recent Results (from the past 240 hour(s))  Culture, blood (routine x 2)     Status: None   Collection Time: 02/04/16 12:36 PM  Result Value Ref Range Status   Specimen Description BLOOD  Final   Special Requests IN PEDIATRIC BOTTLE 3CC  Final   Culture NO GROWTH 5 DAYS  Final   Report Status 02/09/2016 FINAL  Final  Culture, blood (routine x 2)     Status: None   Collection Time: 02/04/16 12:48 PM  Result Value Ref Range Status   Specimen Description BLOOD  Final   Special Requests IN PEDIATRIC BOTTLE 3CC  Final   Culture NO GROWTH 5 DAYS  Final   Report Status 02/09/2016 FINAL  Final      Radiology Studies: Dg Chest 1 View  02/12/2016  CLINICAL DATA:  Congestive heart failure EXAM: CHEST 1 VIEW COMPARISON:  02/08/2016 FINDINGS: Stable cardiac enlargement. Vascular congestion is stable. There is mild diffuse interstitial prominence with peribronchial wall thickening. No pleural effusion. IMPRESSION: Congestive heart failure with mild pulmonary edema Electronically Signed   By: Skipper Cliche M.D.   On:  02/12/2016 10:59     Scheduled Meds: . albuterol  2.5 mg Nebulization QHS  . ampicillin (OMNIPEN) IV  2 g Intravenous Q4H  . antiseptic oral rinse  7 mL Mouth Rinse q12n4p  . chlorhexidine  15 mL Mouth Rinse BID  . famotidine  20 mg Oral BID  . feeding supplement (ENSURE ENLIVE)  237 mL Oral TID BM  . furosemide  40 mg Oral Daily  . insulin aspart  0-9 Units Subcutaneous TID WC  . pravastatin  20 mg Oral q1800  . sodium chloride flush  3 mL Intravenous Q12H   Continuous Infusions:   Marzetta Board, MD, PhD [facility] Pager [phone]  If 7PM-7AM, please contact night-coverage [email or web address] Password [facility] 02/12/2016, 11:56 AM

## 2016-02-12 NOTE — Progress Notes (Signed)
Occupational Therapy Treatment Patient Details Name: Margaret [name] MRN: [id number] DOB: Sep 04, 1956 Today's Date: 02/12/2016    History of present illness pt initially presented with L Parieto-Occipital small SAH and at least 4 punctate watershed infarcts.  On 3/16 pt sustained a fall with initial f/u CT negative, but then began to have L sided deficits and stat CT revealed new R MCA Infarct and pt is now s/p R MCA and R ICA Revascularization and clot retrieval.  pt intubated 3/16-3/18.  pt with hx of HTN, COPD, Asthma, DM, Anemia, Hiatal Hernia, Migraines, Panic Attacks, CHF, Pulmonary HTN, Splenic Infarct, SAH, and CVA.     OT comments  Pt more lethargic and fatigued today vs yesterday. Agreeable to sitting EOB. Continues to require +2 total assist for attaining EOB and assist for sitting balance. Pt more easily frustrated today even in quiet environment with minimal distractions particularly when attempting to communicate her needs. Complaining of pain on the bottoms of her feet, intolerant of socks and c/o discomfort with feet on floor. CIR has declined admission, d/c plan changed to SNF.  Follow Up Recommendations  SNF;Supervision/Assistance - 24 hour    Equipment Recommendations       Recommendations for Other Services      Precautions / Restrictions Precautions Precautions: Fall Precaution Comments: dense L hemiplegia       Mobility Bed Mobility Overal bed mobility: Needs Assistance;+2 for physical assistance Bed Mobility: Rolling;Sidelying to Sit;Sit to Sidelying Rolling: Mod assist;+2 for physical assistance;Total assist (mod to L, +2 total to R) Sidelying to sit: Total assist;+2 for physical assistance     Sit to sidelying: +2 for safety/equipment;Total assist General bed mobility comments: +2 total assist to pull up in bed and position  Transfers                      Balance Overall balance assessment: Needs assistance Sitting-balance support: Feet  supported Sitting balance-Leahy Scale: Zero Sitting balance - Comments: assisted varies from min to moderate, sat EOB x approximately 12 minutes Postural control: Left lateral lean                         ADL Overall ADL's : Needs assistance/impaired                     Lower Body Dressing: Total assistance;Bed level Lower Body Dressing Details (indicate cue type and reason): not able to tolerate socks due to pain Toilet Transfer: Total assistance Toilet Transfer Details (indicate cue type and reason): total for bed pan use   Toileting - Clothing Manipulation Details (indicate cue type and reason): pt stating she needed to urinate, but then did not produce when placed on bedpan              Vision                 Additional Comments: L visual field deficits and c/o blurriness persist   Perception     Praxis      Cognition   Behavior During Therapy: Flat affect;Agitated Overall Cognitive Status: Impaired/Different from baseline Area of Impairment: Attention;Following commands;Safety/judgement;Awareness;Problem solving;Memory   Current Attention Level: Selective Memory: Decreased short-term memory  Following Commands: Follows one step commands with increased time Safety/Judgement: Decreased awareness of safety;Decreased awareness of deficits   Problem Solving: Slow processing;Decreased initiation;Difficulty sequencing;Requires verbal cues;Requires tactile cues General Comments: Pt easily frustrated when staff not understanding her needs and asking  her to repeat.    Extremity/Trunk Assessment               Exercises     Shoulder Instructions       General Comments      Pertinent Vitals/ Pain       Pain Assessment: Faces Faces Pain Scale: Hurts whole lot Pain Location: bottoms of feet Pain Descriptors / Indicators: Guarding;Grimacing Pain Intervention(s): Monitored during session;Repositioned;RN gave pain meds during  session;Patient requesting pain meds-RN notified  Home Living                                          Prior Functioning/Environment              Frequency Min 3X/week     Progress Toward Goals  OT Goals(current goals can now be found in the care plan section)  Progress towards OT goals: Not progressing toward goals - comment (decreasd activity tolerance, foot pain)  Acute Rehab OT Goals Patient Stated Goal: did not state  Plan Discharge plan needs to be updated    Co-evaluation    PT/OT/SLP Co-Evaluation/Treatment: Yes Reason for Co-Treatment: Complexity of the patient's impairments (multi-system involvement);For patient/therapist safety   OT goals addressed during session: Strengthening/ROM      End of Session Equipment Utilized During Treatment: Oxygen   Activity Tolerance Patient limited by fatigue   Patient Left in bed;with call bell/phone within reach;with bed alarm set (with DDS in room)   Nurse Communication Patient requests pain meds (aware of pill left in mouth )        Time: [id number] OT Time Calculation (min): 39 min  Charges: OT General Charges $OT Visit: 1 Procedure OT Treatments $Therapeutic Activity: 8-22 mins  Malka So 02/12/2016, 10:29 AM [phone]

## 2016-02-12 NOTE — Clinical Social Work Note (Signed)
Left message with financial counseling requesting assistance with determining if the patient has a Medicaid application on file.   Liz Beach MSW, [place], [place], [id number]

## 2016-02-12 NOTE — Clinical Social Work Note (Signed)
The patient and husband have met with financial counseling to start Medicaid application process. The patient has a few bed offers in this area but the facilities are requiring a Medicaid pending number and assigned case workers name before committing to accepting the patient. Financial counseling will notify CSW once this information can be obtained.   Liz Beach MSW, [place], [place], [phone]

## 2016-02-12 NOTE — Progress Notes (Signed)
Attn: Dr. Enrique Sack:  Patient's husband reports that wife got her dentures from [facility] from Dr. Roosvelt Harps.

## 2016-02-12 NOTE — Progress Notes (Signed)
Subjective: Pt awake  Does not communicate Objective: Filed Vitals:   02/11/16 2102 02/12/16 0616 02/12/16 0644 02/12/16 1215  BP: 135/71 130/53  119/62  Pulse: 80 71  70  Temp: 97.6 F (36.4 C) 98.1 F (36.7 C)  97.9 F (36.6 C)  TempSrc: Oral Oral  Oral  Resp: 22 19  18   Height:      Weight:   236 lb 1.8 oz (107.1 kg)   SpO2: 100% 99%  100%   Weight change: -9 lb 7.7 oz (-4.3 kg)  Intake/Output Summary (Last 24 hours) at 02/12/16 1224 Last data filed at 02/11/16 2339  Gross per 24 hour  Intake    688 ml  Output    375 ml  Net    313 ml    General: Pt awake  Does not answer questions or commands   Neck:  JVP is normal Heart: Regular rate and rhythm, Gr II/VI systolic murmur SLB  , rubs, gallops.  Lungs: Clear to auscultation.  No rales or wheezes. Exemities:  Tr edema.    Tele:  SR    Lab Results: Results for orders placed or performed during the hospital encounter of 02/01/16 (from the past 24 hour(s))  Glucose, capillary     Status: Abnormal   Collection Time: 02/11/16  4:20 PM  Result Value Ref Range   Glucose-Capillary 142 (H) 65 - 99 mg/dL  Glucose, capillary     Status: Abnormal   Collection Time: 02/11/16  9:12 PM  Result Value Ref Range   Glucose-Capillary 196 (H) 65 - 99 mg/dL  Comprehensive metabolic panel     Status: Abnormal   Collection Time: 02/12/16  5:50 AM  Result Value Ref Range   Sodium 140 135 - 145 mmol/L   Potassium 3.9 3.5 - 5.1 mmol/L   Chloride 98 (L) 101 - 111 mmol/L   CO2 30 22 - 32 mmol/L   Glucose, Bld 177 (H) 65 - 99 mg/dL   BUN 14 6 - 20 mg/dL   Creatinine, Ser 0.91 0.44 - 1.00 mg/dL   Calcium 10.6 (H) 8.9 - 10.3 mg/dL   Total Protein 6.3 (L) 6.5 - 8.1 g/dL   Albumin 2.4 (L) 3.5 - 5.0 g/dL   AST 16 15 - 41 U/L   ALT 14 14 - 54 U/L   Alkaline Phosphatase 81 38 - 126 U/L   Total Bilirubin 0.4 0.3 - 1.2 mg/dL   GFR calc non Af Amer >60 >60 mL/min   GFR calc Af Amer >60 >60 mL/min   Anion gap 12 5 - 15  Prealbumin      Status: Abnormal   Collection Time: 02/12/16  5:50 AM  Result Value Ref Range   Prealbumin 14.3 (L) 18 - 38 mg/dL  CBC     Status: Abnormal   Collection Time: 02/12/16  5:50 AM  Result Value Ref Range   WBC 16.5 (H) 4.0 - 10.5 K/uL   RBC 4.13 3.87 - 5.11 MIL/uL   Hemoglobin 11.2 (L) 12.0 - 15.0 g/dL   HCT 37.4 36.0 - 46.0 %   MCV 90.6 78.0 - 100.0 fL   MCH 27.1 26.0 - 34.0 pg   MCHC 29.9 (L) 30.0 - 36.0 g/dL   RDW 15.2 11.5 - 15.5 %   Platelets 571 (H) 150 - 400 K/uL  Protime-INR     Status: Abnormal   Collection Time: 02/12/16  5:50 AM  Result Value Ref Range   Prothrombin Time 15.4 (H) 11.6 -  15.2 seconds   INR 1.20 0.00 - 1.49  APTT     Status: None   Collection Time: 02/12/16  5:50 AM  Result Value Ref Range   aPTT 29 24 - 37 seconds  Glucose, capillary     Status: Abnormal   Collection Time: 02/12/16  7:32 AM  Result Value Ref Range   Glucose-Capillary 182 (H) 65 - 99 mg/dL   Comment 1 Notify RN    Comment 2 Document in Chart   Glucose, capillary     Status: Abnormal   Collection Time: 02/12/16 11:10 AM  Result Value Ref Range   Glucose-Capillary 150 (H) 65 - 99 mg/dL   Comment 1 Notify RN    Comment 2 Document in Chart     Studies/Results: Dg Chest 1 View  02/12/2016  CLINICAL DATA:  Congestive heart failure EXAM: CHEST 1 VIEW COMPARISON:  02/08/2016 FINDINGS: Stable cardiac enlargement. Vascular congestion is stable. There is mild diffuse interstitial prominence with peribronchial wall thickening. No pleural effusion. IMPRESSION: Congestive heart failure with mild pulmonary edema Electronically Signed   By: Skipper Cliche M.D.   On: 02/12/2016 10:59    Medications:REviewed    @PROBHOSP @  1  Endocarditis  Pt with S veridans MV endocarditis with complicating septic emboli  ON ABX   Note plans for d/c  2  Acute on chronic diastolic CHF   I would check labs today  Give 1 dose IV lasix and follow respone  Some volume increase on exam.  I will make sure that pt  has f/u appt in 2 to 3 wks  In clinic     LOS: 11 days   Dorris Carnes 02/12/2016, 12:24 PM

## 2016-02-13 LAB — BASIC METABOLIC PANEL
Anion gap: 9 (ref 5–15)
BUN: 12 mg/dL (ref 6–20)
CHLORIDE: 97 mmol/L — AB (ref 101–111)
CO2: 31 mmol/L (ref 22–32)
CREATININE: 0.91 mg/dL (ref 0.44–1.00)
Calcium: 10.5 mg/dL — ABNORMAL HIGH (ref 8.9–10.3)
GFR calc Af Amer: >60 mL/min (ref >60)
GFR calc non Af Amer: >60 mL/min (ref >60)
Glucose, Bld: 166 mg/dL — ABNORMAL HIGH (ref 65–99)
POTASSIUM: 3.9 mmol/L (ref 3.5–5.1)
Sodium: 137 mmol/L (ref 135–145)

## 2016-02-13 LAB — CBC
HEMATOCRIT: 38.3 % (ref 36.0–46.0)
Hemoglobin: 11.1 g/dL — ABNORMAL LOW (ref 12.0–15.0)
MCH: 26.4 pg (ref 26.0–34.0)
MCHC: 29 g/dL — ABNORMAL LOW (ref 30.0–36.0)
MCV: 91 fL (ref 78.0–100.0)
PLATELETS: 616 10*3/uL — AB (ref 150–400)
RBC: 4.21 MIL/uL (ref 3.87–5.11)
RDW: 15.3 % (ref 11.5–15.5)
WBC: 16 10*3/uL — ABNORMAL HIGH (ref 4.0–10.5)

## 2016-02-13 LAB — GLUCOSE, CAPILLARY
GLUCOSE-CAPILLARY: 143 mg/dL — AB (ref 65–99)
Glucose-Capillary: 151 mg/dL — ABNORMAL HIGH (ref 65–99)
Glucose-Capillary: 204 mg/dL — ABNORMAL HIGH (ref 65–99)
Glucose-Capillary: 160 mg/dL — ABNORMAL HIGH (ref 65–99)

## 2016-02-13 LAB — MAGNESIUM: MAGNESIUM: 2.3 mg/dL (ref 1.7–2.4)

## 2016-02-13 MED ORDER — FLUCONAZOLE 100 MG PO TABS
100.0000 mg | ORAL_TABLET | Freq: Every day | ORAL | Status: DC
  Administered 2016-02-13 – 2016-02-14 (×2): 100 mg via ORAL
  Filled 2016-02-13 (×3): qty 1

## 2016-02-13 NOTE — Progress Notes (Signed)
Spoke to [employer] she states that the patient has refused to allow [place] to place a PICC line, she is going to contact the MD to inquire about IR placing a tunneled cath.

## 2016-02-13 NOTE — Consult Note (Addendum)
WOC wound consult note Reason for Consult: Consult requested for bilat groin wounds. Appearance is consistent with previous arteriogram or other vascular procedure puncture sites, which have reopened at this time. Wound type: Full thickness wounds to inner groin folds: Left .2X.2X1cm and Right .2X.2X.8cm when probed with a swab. Wound bed: Woundbeds red with small amt bloody drainage, no odor or fluctuance. Periwound: Erythremia and red macular papular rash surrounding open wounds to 3 cm in inner skin folds; appearance consistent with candidiasis. Dressing procedure/placement/frequency: Instructions provided for bedside nurses to pack daily with Iodofrm packing strip to absorb drainage and provide antimicrobial benefits and cover with gauze to protect site. Please consider systemic coverage for candidiasis.  Pt has wounds in bilat groin which appear to have re-opened as a result of yeast in skin folds surrounding these locations.   Please re-consult if further assistance is needed.  Thank-you,  Julien Girt MSN, [place], [place], [place], [place]

## 2016-02-13 NOTE — Clinical Social Work Note (Signed)
CSW spoke with patient's husband and daughter at bedside to inform them that [facility] states they will offer the patient a bed with a 30 day LOG with pending Medicaid. CSW will assist with DC when appropriate.    Liz Beach MSW, [place], [place], [id number]

## 2016-02-13 NOTE — Progress Notes (Signed)
Occupational Therapy Treatment Patient Details Name: Margaret [name] MRN: [id number] DOB: 02-27-1956 Today's Date: 02/13/2016    History of present illness pt initially presented with L Parieto-Occipital small SAH and at least 4 punctate watershed infarcts.  On 3/16 pt sustained a fall with initial f/u CT negative, but then began to have L sided deficits and stat CT revealed new R MCA Infarct and pt is now s/p R MCA and R ICA Revascularization and clot retrieval.  pt intubated 3/16-3/18.  pt with hx of HTN, COPD, Asthma, DM, Anemia, Hiatal Hernia, Migraines, Panic Attacks, CHF, Pulmonary HTN, Splenic Infarct, SAH, and CVA.     OT comments  Pt requiring less assistance for bed mobility. Sat EOB with mod assist progressing to min guard assist x 35 minutes. Verbal cues to maintain midline orientation of trunk in sitting, especially during functional activity. Self fed with cues for pocketing, to wipe mouth and for task persistence while seated EOB. Pt requiring min cues to locate food items on L side of tray and progressed to requiring no cues.   Follow Up Recommendations  SNF;Supervision/Assistance - 24 hour    Equipment Recommendations       Recommendations for Other Services      Precautions / Restrictions Precautions Precautions: Fall Precaution Comments: dense L hemiplegia       Mobility Bed Mobility Overal bed mobility: Needs Assistance;+2 for physical assistance Bed Mobility: Rolling;Supine to Sit;Sit to Supine Rolling: Mod assist;+2 for physical assistance (+2 toward R )   Supine to sit: +2 for physical assistance;Mod assist Sit to supine: +2 for physical assistance;Mod assist   General bed mobility comments: cues for sequence, assist for L UE/LE and trunk  Transfers                      Balance Overall balance assessment: Needs assistance Sitting-balance support: Feet supported;Single extremity supported Sitting balance-Leahy Scale: Poor Sitting balance -  Comments: leaning to L, but able to self correct with verbal cues, posture decreases when pt engaged in activity. Postural control: Left lateral lean                         ADL Overall ADL's : Needs assistance/impaired Eating/Feeding: Minimal assistance;Sitting Eating/Feeding Details (indicate cue type and reason): less spillage, cues and assist to wipe mouth, located food and drink beyond midline toward L with moderate progressing to no verbal cues                 Lower Body Dressing: Total assistance;Bed level   Toilet Transfer: Total assistance Toilet Transfer Details (indicate cue type and reason): total for bed pan use           General ADL Comments: Pt + for nystagmus with sit to supine with dizziness.      Vision                     Perception     Praxis      Cognition   Behavior During Therapy: Flat affect;Agitated (less agitated today) Overall Cognitive Status: Impaired/Different from baseline Area of Impairment: Attention;Following commands;Safety/judgement;Awareness;Problem solving   Current Attention Level: Selective Memory: Decreased short-term memory  Following Commands: Follows one step commands with increased time Safety/Judgement: Decreased awareness of safety;Decreased awareness of deficits   Problem Solving: Slow processing;Requires verbal cues;Decreased initiation;Difficulty sequencing General Comments: Pt only agitated today when interrupted.    Extremity/Trunk Assessment  Exercises     Shoulder Instructions       General Comments      Pertinent Vitals/ Pain       Pain Assessment: Faces Faces Pain Scale: Hurts a little bit Pain Location: feet Pain Descriptors / Indicators: Sore Pain Intervention(s): Monitored during session  Home Living                                          Prior Functioning/Environment              Frequency Min 3X/week     Progress Toward  Goals  OT Goals(current goals can now be found in the care plan section)  Progress towards OT goals: Progressing toward goals  Acute Rehab OT Goals Patient Stated Goal: get off her backside  Plan Discharge plan remains appropriate    Co-evaluation    PT/OT/SLP Co-Evaluation/Treatment: Yes Reason for Co-Treatment: For patient/therapist safety;Necessary to address cognition/behavior during functional activity   OT goals addressed during session: ADL's and self-care      End of Session Equipment Utilized During Treatment: Oxygen   Activity Tolerance Patient tolerated treatment well   Patient Left in bed;with call bell/phone within reach;with bed alarm set   Nurse Communication  (NT aware pt is on bedpan)        Time: [id number] OT Time Calculation (min): 47 min  Charges: OT General Charges $OT Visit: 1 Procedure OT Treatments $Self Care/Home Management : 8-22 mins $Neuromuscular Re-education: 8-22 mins  Margaret [name] 02/13/2016, 2:38 PM  [phone]

## 2016-02-13 NOTE — Progress Notes (Signed)
   Patient Name: Margaret [name] Date of Encounter: 02/13/2016   SUBJECTIVE  Pt is sleepy. Does not communicate.   CURRENT MEDS . albuterol  2.5 mg Nebulization QHS  . ampicillin (OMNIPEN) IV  2 g Intravenous Q4H  . antiseptic oral rinse  7 mL Mouth Rinse q12n4p  . chlorhexidine  15 mL Mouth Rinse BID  . famotidine  20 mg Oral BID  . feeding supplement (ENSURE ENLIVE)  237 mL Oral TID BM  . insulin aspart  0-9 Units Subcutaneous TID WC  . pravastatin  20 mg Oral q1800  . sodium chloride flush  3 mL Intravenous Q12H    OBJECTIVE  Filed Vitals:   02/12/16 1952 02/13/16 0000 02/13/16 0157 02/13/16 0500  BP: 107/62 145/79  120/57  Pulse: 70 75  76  Temp: 99 F (37.2 C) 98.7 F (37.1 C)  98.2 F (36.8 C)  TempSrc: Oral Oral  Oral  Resp: 20 18    Height:      Weight:   259 lb 14.8 oz (117.9 kg)   SpO2: 100% 98%  95%    Intake/Output Summary (Last 24 hours) at 02/13/16 0833 Last data filed at 02/12/16 1558  Gross per 24 hour  Intake    125 ml  Output    400 ml  Net   -275 ml   Filed Weights   02/11/16 0356 02/12/16 0644 02/13/16 0157  Weight: 245 lb 9.5 oz (111.4 kg) 236 lb 1.8 oz (107.1 kg) 259 lb 14.8 oz (117.9 kg)    PHYSICAL EXAM  General: Sleepy, open eye with stimuli and goes back to sleep on supplement oxygen.  HEENT:  Normal  Neck: Supple without bruits or JVD. Lungs:  Resp regular and unlabored, CTA. Heart: RRR no s3, s4. II/VI systolic murmur Abdomen: Soft, non-tender, non-distended, BS + x 4.  Extremities: No clubbing, cyanosis or edema. DP+ and equal bilaterally.  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 ON ABX. Per Primary.   2 Acute on chronic diastolic CHF: BNP of 0000000. Given IV lasix 40mg  x 1 yesterday with -275cc diuresis. Total negative 4.3L. Scr stable. Looks euvolemic.   3. Sinus arrhthymias - Tele showed 6 beats of  SVT x 1 20:59 . One strip SVT/sinus tachy for 22 beats 16:35 yesterday. Will review with MD. K normal. Will get Mg.   Signed, Leanor Kail PA-C Pager [phone]  Patient seen and examined  I agree with findingas as noted by B Bhagat above Pt awake but answers few questoins  LUngs are CTA  Card RRR  II/VI systolic murmur  Ext without edema No new recomm for medicines Pt will need outpt f/u in cardiology.  Dorris Carnes

## 2016-02-13 NOTE — Clinical Social Work Placement (Signed)
   CLINICAL SOCIAL WORK PLACEMENT  NOTE  Date:  02/13/2016  Patient Details  Name: AANIKA TAUSCHER MRN: [id number] Date of Birth: Feb 01, 1956  Clinical Social Work is seeking post-discharge placement for this patient at the [place] level of care (*CSW will initial, date and re-position this form in  chart as items are completed):  Yes   Patient/family provided with [place] Work Department's list of facilities offering this level of care within the geographic area requested by the patient (or if unable, by the patient's family).  Yes   Patient/family informed of their freedom to choose among providers that offer the needed level of care, that participate in Medicare, Medicaid or managed care program needed by the patient, have an available bed and are willing to accept the patient.  Yes   Patient/family informed of [facility]'s ownership interest in [facility] and [facility], as well as of the fact that they are under no obligation to receive care at these facilities.  PASRR submitted to EDS on 02/11/16     PASRR number received on 02/11/16     Existing PASRR number confirmed on       FL2 transmitted to all facilities in geographic area requested by pt/family on 02/11/16     FL2 transmitted to all facilities within larger geographic area on       Patient informed that his/her managed care company has contracts with or will negotiate with certain facilities, including the following:        Yes   Patient/family informed of bed offers received.  Patient chooses bed at [facility]     Physician recommends and patient chooses bed at      Patient to be transferred to [facility] on  .  Patient to be transferred to facility by       Patient family notified on   of transfer.  Name of family member notified:        PHYSICIAN Please prepare priority discharge summary, including medications, Please  prepare prescriptions, Please sign FL2     Additional Comment:    _______________________________________________ Rigoberto Noel, LCSW 02/13/2016, 5:37 PM

## 2016-02-13 NOTE — Progress Notes (Signed)
PROGRESS NOTE  Margaret [name] [id number] DOB: [birth year] DOA: 02/01/2016 PCP: Harvie Junior, MD Outpatient Specialists:    LOS: 12 days   Brief Narrative: 60 year old female with past medical history of hypertension, hyperlipidemia, COPD, diabetes mellitus, and CHF. She recently admitted in February of this year for acute splenic infarction without clear etiology. She was admitted on 3/11 with fever, headache and a small subarachnoid bleed as well as several small "punctate infarcts" consistent with embolic events. Blood cultures were positive for strep viridans at that point.  On 3/16 she had a mechanical fall and had a CT head that was read as normal around 1400. Apparently she was given ativan. After return from CT imaging she was noted to have left facial droop, slurred speech around 4PM. Stroke was called, CT head repeated, then IR was consulted for possible cerebral angiogram. She underwent an angiogram showing a R MCA embolism and occluded right internal carotid artery embolism. These were retrieved and then she underwent selective intra-arterial integrilin infusion.   Assessment & Plan: Principal Problem:   Bacterial endocarditis Active Problems:   HTN (hypertension)   COPD (chronic obstructive pulmonary disease) (HCC)   Chronic diastolic CHF (congestive heart failure) (HCC)   Morbid obesity (HCC)   Insulin dependent diabetes mellitus (HCC)   Essential hypertension   Hepatic cirrhosis (HCC)   Splenic infarct   Pulmonary hypertension assoc with unclear multi-factorial mechanisms (HCC)   Subarachnoid hemorrhage (HCC)   Leukocytosis   Diarrhea   Hyponatremia   Subarachnoid bleed (HCC)   Stroke (HCC)   Visual changes   Bacteremia   Cerebrovascular accident (CVA) due to embolism of cerebral artery (HCC)   Streptococcus viridans infection   Epigastric abdominal tenderness on direct palpation   Positive ANA (antinuclear antibody)   Acute respiratory failure  with hypoxemia (HCC)   Right carotid artery occlusion   Tachypnea   Acute blood loss anemia   Acute on chronic diastolic CHF (congestive heart failure), NYHA class 1 (HCC)   Acute CVA - due to right ICA acute occlusion s/p endovascular thrombectomy of terminal RICA occlusion   SAH within the left parieto-occipital region, and 4 punctate infarcts at the watershed area including left MCA/PCA, bilateral MCA/ACA - likely due to endocarditis  Strep viridans MV endocarditis with regurgitation - on Ampicillin, needs IV antibiotics until 03/16/16 - PICC Line pending, initially refused placement, now agreeable, however refused again 3/23. Will ask IR to place a tunneled CVC, appreciate input - TCV consulted, will eventually need valve repair - dental consulted, appreciate input  SAH - was on Xarelto on admission due to splenic infarct prior hospitalization - off anticoagulation now  RLE pain - r/o DVT with [place]  Groin wounds - appears at the site of vascular access - wound consult.  Acute respiratory failure - extubated now, on 2L [place], stable  DM - poorly controlled - continue SSI  HTN - pressures normal, avoid hypotension, continue to hold home medications  HLD - continue statin  DVT prophylaxis: SCD Code Status: Full Family Communication: d/w husband Disposition Plan: SNF when ready  Barriers for discharge:  PICC LIne  Consultants:   TCV  Dentistry  PCCM  Procedures:  CULTURES: 3/11 blood > viridans strep 3/14 blood > negative  ANTIBIOTICS: 3/12 Ceftriaxone > 3/16 3/16 Ampicillin >    STUDIES/eVENTS 02/01/2016 - admit:  3/11 CT head > localized subarachnoid hemorrhage/debris in left parietal occipital region 3/11 CT angiogram> mild intracranial and extracranial atherosclerosis without stenosis, patent dural venous  sinuses, no vascular malformation 3/11 MRI/MRA brain > small volume subarachnoid hemorrhage/debri left parieto-occipital region, few sub cm  cortical subcortical ischemic infarcts in bilateral parietal lobes, negative MRA 3/14 EEG > no evidence of epileptiform discharges 3/15 TEE> 2 cm mitral valve vegetation with regurgitation 3/15 CT ab/pelvis> new splenic infarct, larger than before, enlarged spleen, diffuse hepatic steatosis, mosaic attenuation of lung bases, consider pulmonary arterial hypertension 3/16 CT head > No significant change from prior 3/16 CT head> decreased conspicuity of left parieto-occipital subarachnoid hemorrhage; ? New area of hemorrhage R parietal sulcus. ACUTE STROKE. INTUBATE 3/16 IR angiogram> R internal carotid and R MCA occlusion 3/16 CT head > unchanged small volume R parietal and left parieto occipital subarachnoid hemorrhage 3/17 Head Ct > interval decrease small vol R parietal and L parietal-occipital bleed, no new bleed, no acute infarct yet 3/18 MRI brain >> patchy multifocal right MCA infarct, scattered parenchymal hemorrhage, probable new left parietal cortical infarct, subacute left cerebellar hemisphere CVA, slight interval increase in prominence of subarachnoid hemorrhage in the left parieto-occipital region. EXTUBATE 3./20 - Afebrile. Denies pain. Good UO 3/22 TRH took over  Antimicrobials: 3/12 Ceftriaxone > 3/16 3/16 Ampicillin >    Subjective: - denies chest pain, dyspnea, no abdominal pain, nausea/vomiting.  - endorsing left LE pain  Objective: Filed Vitals:   02/12/16 1952 02/13/16 0000 02/13/16 0157 02/13/16 0500  BP: 107/62 145/79  120/57  Pulse: 70 75  76  Temp: 99 F (37.2 C) 98.7 F (37.1 C)  98.2 F (36.8 C)  TempSrc: Oral Oral  Oral  Resp: 20 18    Height:      Weight:   117.9 kg (259 lb 14.8 oz)   SpO2: 100% 98%  95%    Intake/Output Summary (Last 24 hours) at 02/13/16 1106 Last data filed at 02/12/16 1558  Gross per 24 hour  Intake    125 ml  Output    400 ml  Net   -275 ml   Filed Weights   02/11/16 0356 02/12/16 0644 02/13/16 0157  Weight: 111.4 kg  (245 lb 9.5 oz) 107.1 kg (236 lb 1.8 oz) 117.9 kg (259 lb 14.8 oz)    Examination: BP 120/57 mmHg  Pulse 76  Temp(Src) 98.2 F (36.8 C) (Oral)  Resp 18  Ht 5\' 5"  (1.651 m)  Wt 117.9 kg (259 lb 14.8 oz)  BMI 43.25 kg/m2  SpO2 95%  GENERAL: NAD  HEENT: head NCAT, no scleral icterus. Pupils round and reactive.  NECK: Supple. No carotid bruits. No lymphadenopathy or thyromegaly.  LUNGS: Clear to auscultation. No wheezing or crackles  HEART: Regular rate and rhythm without murmur. 2+ pulses, no JVD, no peripheral edema  ABDOMEN: Soft, nontender, and nondistended. Positive bowel sounds.   EXTREMITIES: Without any cyanosis or clubbing  NEUROLOGIC: dense left hemiplegia, left sided neglect. Slurred speech   Data Reviewed: I have personally reviewed following labs and imaging studies  CBC:  Recent Labs Lab 02/08/16 0338 02/09/16 1252 02/11/16 0439 02/12/16 0550 02/13/16 0435  WBC 20.9* 16.1* 16.3* 16.5* 16.0*  NEUTROABS  --  13.0*  --   --   --   HGB 10.9* 11.0* 11.3* 11.2* 11.1*  HCT 36.4 37.4 38.1 37.4 38.3  MCV 89.9 90.3 89.0 90.6 91.0  PLT 454* 540* 583* 571* 99991111*   Basic Metabolic Panel:  Recent Labs Lab 02/08/16 0338 02/11/16 0439 02/12/16 0550 02/12/16 1250 02/13/16 0435 02/13/16 0921  NA 139 138 140 139 137  --   K  3.9 3.7 3.9 4.1 3.9  --   CL 98* 97* 98* 99* 97*  --   CO2 28 31 30  32 31  --   GLUCOSE 153* 163* 177* 151* 166*  --   BUN 7 15 14 10 12   --   CREATININE 0.88 0.91 0.91 0.94 0.91  --   CALCIUM 10.4* 10.7* 10.6* 10.5* 10.5*  --   MG  --   --   --   --   --  2.3   GFR: Estimated Creatinine Clearance: 85.5 mL/min (by C-G formula based on Cr of 0.91). Liver Function Tests:  Recent Labs Lab 02/07/16 0545 02/12/16 0550  AST 11* 16  ALT 10* 14  ALKPHOS 67 81  BILITOT 0.2* 0.4  PROT 5.9* 6.3*  ALBUMIN 2.3* 2.4*   No results for input(s): LIPASE, AMYLASE in the last 168 hours.  Recent Labs Lab 02/06/16 1555  AMMONIA 29    Coagulation Profile:  Recent Labs Lab 02/07/16 0545 02/12/16 0550  INR 1.32 1.20   Cardiac Enzymes: No results for input(s): CKTOTAL, CKMB, CKMBINDEX, TROPONINI in the last 168 hours. BNP (last 3 results) No results for input(s): PROBNP in the last 8760 hours. HbA1C: No results for input(s): HGBA1C in the last 72 hours. CBG:  Recent Labs Lab 02/12/16 0732 02/12/16 1110 02/12/16 1624 02/12/16 1950 02/13/16 0734  GLUCAP 182* 150* 149* 146* 151*   Lipid Profile: No results for input(s): CHOL, HDL, LDLCALC, TRIG, CHOLHDL, LDLDIRECT in the last 72 hours. Thyroid Function Tests: No results for input(s): TSH, T4TOTAL, FREET4, T3FREE, THYROIDAB in the last 72 hours. Anemia Panel: No results for input(s): VITAMINB12, FOLATE, FERRITIN, TIBC, IRON, RETICCTPCT in the last 72 hours. Urine analysis:    Component Value Date/Time   COLORURINE YELLOW 02/11/2016 0842   APPEARANCEUR HAZY* 02/11/2016 0842   LABSPEC 1.028 02/11/2016 0842   PHURINE 6.5 02/11/2016 0842   GLUCOSEU NEGATIVE 02/11/2016 0842   HGBUR TRACE* 02/11/2016 0842   BILIRUBINUR NEGATIVE 02/11/2016 0842   KETONESUR NEGATIVE 02/11/2016 0842   PROTEINUR NEGATIVE 02/11/2016 0842   UROBILINOGEN 0.2 04/27/2012 2313   NITRITE NEGATIVE 02/11/2016 0842   LEUKOCYTESUR TRACE* 02/11/2016 0842   Sepsis Labs: Invalid input(s): PROCALCITONIN, LACTICIDVEN  Recent Results (from the past 240 hour(s))  Culture, blood (routine x 2)     Status: None   Collection Time: 02/04/16 12:36 PM  Result Value Ref Range Status   Specimen Description BLOOD  Final   Special Requests IN PEDIATRIC BOTTLE 3CC  Final   Culture NO GROWTH 5 DAYS  Final   Report Status 02/09/2016 FINAL  Final  Culture, blood (routine x 2)     Status: None   Collection Time: 02/04/16 12:48 PM  Result Value Ref Range Status   Specimen Description BLOOD  Final   Special Requests IN PEDIATRIC BOTTLE 3CC  Final   Culture NO GROWTH 5 DAYS  Final   Report Status  02/09/2016 FINAL  Final  Culture, Urine     Status: None   Collection Time: 02/11/16  8:41 AM  Result Value Ref Range Status   Specimen Description URINE, CLEAN CATCH  Final   Special Requests NONE  Final   Culture 4,000 COLONIES/mL INSIGNIFICANT GROWTH  Final   Report Status 02/12/2016 FINAL  Final      Radiology Studies: Dg Chest 1 View  02/12/2016  CLINICAL DATA:  Congestive heart failure EXAM: CHEST 1 VIEW COMPARISON:  02/08/2016 FINDINGS: Stable cardiac enlargement. Vascular congestion is stable. There  is mild diffuse interstitial prominence with peribronchial wall thickening. No pleural effusion. IMPRESSION: Congestive heart failure with mild pulmonary edema Electronically Signed   By: Skipper Cliche M.D.   On: 02/12/2016 10:59     Scheduled Meds: . albuterol  2.5 mg Nebulization QHS  . ampicillin (OMNIPEN) IV  2 g Intravenous Q4H  . antiseptic oral rinse  7 mL Mouth Rinse q12n4p  . chlorhexidine  15 mL Mouth Rinse BID  . famotidine  20 mg Oral BID  . feeding supplement (ENSURE ENLIVE)  237 mL Oral TID BM  . insulin aspart  0-9 Units Subcutaneous TID WC  . pravastatin  20 mg Oral q1800  . sodium chloride flush  3 mL Intravenous Q12H   Continuous Infusions:   Marzetta Board, MD, PhD [facility] Pager [phone]  If 7PM-7AM, please contact night-coverage [email or web address] Password [facility] 02/13/2016, 11:06 AM

## 2016-02-13 NOTE — Progress Notes (Signed)
Physical Therapy Treatment Patient Details Name: Margaret [name] MRN: [id number] DOB: 04-Mar-1956 Today's Date: 02/13/2016    History of Present Illness pt initially presented with L Parieto-Occipital small SAH and at least 4 punctate watershed infarcts.  On 3/16 pt sustained a fall with initial f/u CT negative, but then began to have L sided deficits and stat CT revealed new R MCA Infarct and pt is now s/p R MCA and R ICA Revascularization and clot retrieval.  pt intubated 3/16-3/18.  pt with hx of HTN, COPD, Asthma, DM, Anemia, Hiatal Hernia, Migraines, Panic Attacks, CHF, Pulmonary HTN, Splenic Infarct, SAH, and CVA.      PT Comments    Pt participating better today and able to tolerate sitting EOB to eat lunch.  Pt needed cueing throughout session for attention to task and to balance.  Upon returning to supine pt c/o dizziness and Nystagmus noted.  Will continue to work with pt while on acute.    Follow Up Recommendations  SNF     Equipment Recommendations  None recommended by PT    Recommendations for Other Services       Precautions / Restrictions Precautions Precautions: Fall Precaution Comments: dense L hemiplegia Restrictions Weight Bearing Restrictions: No    Mobility  Bed Mobility Overal bed mobility: Needs Assistance;+2 for physical assistance Bed Mobility: Rolling;Supine to Sit;Sit to Supine Rolling: Mod assist;+2 for physical assistance (+2 toward R )   Supine to sit: +2 for physical assistance;Mod assist Sit to supine: +2 for physical assistance;Mod assist   General bed mobility comments: cues for sequence, assist for L UE/LE and trunk  Transfers                    Ambulation/Gait                 Stairs            Wheelchair Mobility    Modified Rankin (Stroke Patients Only) Modified Rankin (Stroke Patients Only) Pre-Morbid Rankin Score: No symptoms Modified Rankin: Severe disability     Balance Overall balance  assessment: Needs assistance Sitting-balance support: Feet supported;Single extremity supported Sitting balance-Leahy Scale: Poor Sitting balance - Comments: leaning to L, but able to self correct with verbal cues, posture decreases when pt engaged in activity. Postural control: Left lateral lean                          Cognition Arousal/Alertness: Awake/alert Behavior During Therapy: Flat affect;Agitated (less agitated today) Overall Cognitive Status: Impaired/Different from baseline Area of Impairment: Attention;Following commands;Safety/judgement;Awareness;Problem solving   Current Attention Level: Selective Memory: Decreased short-term memory Following Commands: Follows one step commands with increased time Safety/Judgement: Decreased awareness of safety;Decreased awareness of deficits   Problem Solving: Slow processing;Requires verbal cues;Decreased initiation;Difficulty sequencing General Comments: Pt only agitated today when interrupted.    Exercises      General Comments        Pertinent Vitals/Pain Pain Assessment: Faces Faces Pain Scale: Hurts a little bit Pain Location: Feet Pain Descriptors / Indicators: Sore Pain Intervention(s): Monitored during session;Premedicated before session;Repositioned    Home Living                      Prior Function            PT Goals (current goals can now be found in the care plan section) Acute Rehab PT Goals Patient Stated Goal: get off her backside  PT Goal Formulation: With patient Time For Goal Achievement: 02/24/16 Potential to Achieve Goals: Fair Progress towards PT goals: Progressing toward goals    Frequency  Min 3X/week    PT Plan Current plan remains appropriate    Co-evaluation PT/OT/SLP Co-Evaluation/Treatment: Yes Reason for Co-Treatment: For patient/therapist safety;Necessary to address cognition/behavior during functional activity PT goals addressed during session:  Mobility/safety with mobility;Balance OT goals addressed during session: ADL's and self-care     End of Session   Activity Tolerance: Patient tolerated treatment well Patient left: in bed;with call bell/phone within reach;with bed alarm set     Time: [id number] PT Time Calculation (min) (ACUTE ONLY): 47 min  Charges:  $Therapeutic Activity: 8-22 mins                    G CodesCatarina Hartshorn, [place] 02/13/2016, 3:46 PM

## 2016-02-13 NOTE — Progress Notes (Signed)
Speech Language Pathology Treatment: Dysphagia;Cognitive-Linquistic  Patient Details Name: Margaret [name] MRN: [id number] DOB: March 10, 1956 Today's Date: 02/13/2016 Time: [id number] SLP Time Calculation (min) (ACUTE ONLY): 20 min  Assessment / Plan / Recommendation Clinical Impression  SLP provided verbal and contextual cues to address speech intelligibility and functional communication. Pt is moderately dysarthric and does not compensate well without verbal cues to revise response and over-articulate with greater volume. SLP often deliberately misunderstood pts directions regarding care to elicit improved repetition from pt, increasing intelligibility at sentence level to 80%. Pt is tolerating thin liquids and purees well. Provided moderate verbal cues to direct pts attention PO placed in left visual field. Also assisted pt in placing top denture to attempt solid trials. Pt with severe labial and buccal residuals with solids, moderate verbal cues needed to clear. Postural support also poor increasing risk of aspirating particulate matter. Pt states she is very fearful of swallowing and does not want to upgrade diet. Encouraged pt to be open to further trials of solids to progress her ability to masticate and manipulate the bolus. Pt agreeable. Recommend CIR at d/c.    HPI HPI: Pt is a 60 y.o. female with PMH includes hypertension, hyperlipidemia, COPD, diabetes mellitus, and CHF. Admitted 3/11 with fever, headache and a small subarachnoid bleed as well as several small "punctate infarcts" consistent with embolic events. on 3/16 pt had mechanical fall with normal head CT, afterwards was noted to have left facial droop, slurred speech; angiogram showed R MCA embolism; intubated 3/16 to 3/18. Bedside swallow eval ordered to assess swallow function post-2 day intubation.      SLP Plan  Continue with current plan of care     Recommendations  Diet recommendations: Thin liquid;Dysphagia 1  (puree) Liquids provided via: Straw Medication Administration: Crushed with puree Supervision: Patient able to self feed;Full supervision/cueing for compensatory strategies Compensations: Slow rate;Small sips/bites;Minimize environmental distractions Postural Changes and/or Swallow Maneuvers: Seated upright 90 degrees             General recommendations: Rehab consult Oral Care Recommendations: Oral care BID Follow up Recommendations: Inpatient Rehab Plan: Continue with current plan of care     GO               [facility], [place] CCC-SLP [id number]  Lynann Beaver 02/13/2016, 1:54 PM

## 2016-02-14 ENCOUNTER — Inpatient Hospital Stay (HOSPITAL_COMMUNITY): Payer: Medicaid Other

## 2016-02-14 ENCOUNTER — Telehealth (HOSPITAL_COMMUNITY): Payer: Self-pay | Admitting: Dentistry

## 2016-02-14 DIAGNOSIS — R52 Pain, unspecified: Secondary | ICD-10-CM

## 2016-02-14 DIAGNOSIS — I058 Other rheumatic mitral valve diseases: Secondary | ICD-10-CM

## 2016-02-14 LAB — GLUCOSE, CAPILLARY: Glucose-Capillary: 146 mg/dL — ABNORMAL HIGH (ref 65–99)

## 2016-02-14 MED ORDER — SODIUM CHLORIDE 0.9 % IV SOLN: 2.0000 g | INTRAVENOUS | Status: DC

## 2016-02-14 MED ORDER — LIDOCAINE HCL 1 % IJ SOLN
INTRAMUSCULAR | Status: AC
  Filled 2016-02-14: qty 20

## 2016-02-14 MED ORDER — MIDAZOLAM HCL 2 MG/2ML IJ SOLN
INTRAMUSCULAR | Status: AC | PRN
  Administered 2016-02-14 (×2): 1 mg via INTRAVENOUS

## 2016-02-14 MED ORDER — HYDROCODONE-ACETAMINOPHEN 5-325 MG PO TABS: 1.0000 | ORAL_TABLET | ORAL | Status: DC | PRN

## 2016-02-14 MED ORDER — OXYCODONE-ACETAMINOPHEN 10-325 MG PO TABS: 1.0000 | ORAL_TABLET | Freq: Two times a day (BID) | ORAL | Status: DC | PRN

## 2016-02-14 MED ORDER — MIDAZOLAM HCL 2 MG/2ML IJ SOLN
INTRAMUSCULAR | Status: AC
  Filled 2016-02-14: qty 4

## 2016-02-14 MED ORDER — FLUCONAZOLE 100 MG PO TABS: 100.0000 mg | ORAL_TABLET | Freq: Every day | ORAL | Status: DC

## 2016-02-14 NOTE — Progress Notes (Signed)
SLP Cancellation Note  Patient Details Name: Margaret [name] MRN: [id number] DOB: [birth year]   Cancelled treatment:       Reason Eval/Treat Not Completed: Patient at procedure or test/unavailable, SLP will follow up as appropriate.     Lonnel Gjerde, Selinda Orion 02/14/2016, 2:28 PM

## 2016-02-14 NOTE — Progress Notes (Signed)
Note plans for D/C I have made appt for follow up in cardiology clinic with  Richardson Dopp PA on April 14 at 10:10 AM [facility]   [address]

## 2016-02-14 NOTE — Sedation Documentation (Signed)
Patient is resting comfortably. 

## 2016-02-14 NOTE — Progress Notes (Signed)
PT Cancellation Note  Patient Details Name: CHARNIECE KWIAT MRN: [id number] DOB: 04-Apr-1956   Cancelled Treatment:    Reason Eval/Treat Not Completed: Patient at procedure or test/unavailable.  Attempted to see patient x2 today.  Patient in testing, and now patient being d/c to SNF.   Despina Pole 02/14/2016, 4:56 PM Carita Pian. Sanjuana Kava, [place] Pager [phone]

## 2016-02-14 NOTE — Progress Notes (Signed)
VASCULAR LAB PRELIMINARY  PRELIMINARY  PRELIMINARY  PRELIMINARY  Bilateral lower extremity venous duplex completed.    Preliminary report:  Bilateral:  No obvious evidence of DVT, superficial thrombosis, or Baker's Cyst. No change since previous study   Syleena Mchan, RVS 02/14/2016, 4:04 PM

## 2016-02-14 NOTE — Progress Notes (Signed)
Patient returned from testing, family complaints of patient being soiled, patient would like to eat and be bathed, PTAR on the way to pick up patient to transport to [facility]

## 2016-02-14 NOTE — Plan of Care (Signed)
Problem: Health Behavior/Discharge Planning: Goal: Ability to manage health-related needs will improve Outcome: Completed/Met Date Met:  02/14/16 Patient to be discharged to [facility] nursing, family aware and patient agreeable

## 2016-02-14 NOTE — Clinical Social Work Placement (Signed)
   CLINICAL SOCIAL WORK PLACEMENT  NOTE  Date:  02/14/2016  Patient Details  Name: Margaret [name] MRN: [id number] Date of Birth: [birth year]  Clinical Social Work is seeking post-discharge placement for this patient at the [place] level of care (*CSW will initial, date and re-position this form in  chart as items are completed):  Yes   Patient/family provided with [facility] Work Department's list of facilities offering this level of care within the geographic area requested by the patient (or if unable, by the patient's family).  Yes   Patient/family informed of their freedom to choose among providers that offer the needed level of care, that participate in Medicare, Medicaid or managed care program needed by the patient, have an available bed and are willing to accept the patient.  Yes   Patient/family informed of [facility]'s ownership interest in [facility] and [facility], as well as of the fact that they are under no obligation to receive care at these facilities.  PASRR submitted to EDS on 02/11/16     PASRR number received on 02/11/16     Existing PASRR number confirmed on       FL2 transmitted to all facilities in geographic area requested by pt/family on 02/11/16     FL2 transmitted to all facilities within larger geographic area on       Patient informed that his/her managed care company has contracts with or will negotiate with certain facilities, including the following:        Yes   Patient/family informed of bed offers received.  Patient chooses bed at [facility]     Physician recommends and patient chooses bed at      Patient to be transferred to [facility] on 02/14/16.  Patient to be transferred to facility by Ambulance     Patient family notified on 02/14/16 of transfer.  Name of family member notified:  Patient husband and daughter at bedside     PHYSICIAN Please prepare  priority discharge summary, including medications, Please prepare prescriptions, Please sign FL2     Additional Comment:    _______________________________________________ Barbette Or, [place]

## 2016-02-14 NOTE — Care Management (Signed)
Case Management Note Initial Review Started by Whitman Hero.  Patient Details  Name: Margaret [name] MRN: [id number] Date of Birth: 02-06-1956  Subjective/Objective: Admitted with splenic infarct/ bacteremia. From home with husband.Indendent with ADL's PTA. Pt with no PCP or insurance.    Action/Plan: Plan to d/c home with home health services when medically stable.CM to f/u with disposition needs. CM scheduled post hospital follow up appointment with [facility] on 02/11/2016 @ 3pm to help establish PCP and medication assistance @ d/c.  Expected Discharge Date:     Expected Discharge Plan: [place]  In-House Referral:    Discharge planning Services CM Consult  Post Acute Care Choice:   Choice offered to: Patient  DME Arranged: IV pump/equipment DME Agency: [place]: RN [facility] Agency: [place]  Status of Service:Completed.   Medicare Important Message Given:   Date Medicare IM Given:   Medicare IM give by:   Date Additional Medicare IM Given:   Additional Medicare Important Message give by:   Additional Comments: [id number] 02-14-16 Jacqlyn Krauss, RN,BSN [phone] Pt agreeable to SNF placement. Plan for [facility]. CSW assisting with disposition needs. No further needs from CM at this time.

## 2016-02-14 NOTE — Telephone Encounter (Signed)
02/14/2016  Patient:            Margaret [name] Date of Birth:  May 11, 1956 MRN:                [id number]   I contacted Dr. Roosvelt Harps at his private practice in [place], [place]. He remembers the patient from treatment at [facility] when he was a Ship broker in approximately 2011 or 2012. Patient had upper complete denture and lower cast partial denture fabricated at that time. No records of the patient are available from Dr. Ronelle Nigh. Patient will need follow up evaluation prior to heart valve surgery once she is medically stable.  Lenn Cal, DDS

## 2016-02-14 NOTE — Procedures (Signed)
Successful placement of tunneled PICC with tips terminating within the superior CA junction   EBL: None The patient tolerated the procedure well without immediate post procedural complication.  The catheter is ready for immediate use.   Ronny Bacon, MD Pager #: [phone]

## 2016-02-14 NOTE — Discharge Summary (Addendum)
Physician Discharge Summary  Margaret [name] ZOX:[id number] DOB: March 06, 1956 DOA: 02/01/2016  PCP: Harvie Junior, MD  Admit date: 02/01/2016 Discharge date: 02/14/2016  Time spent: > 30 minutes  Recommendations for Outpatient Follow-up:  1. Follow up with Cardiology and Cardiothoracic surgery in 2-4 weeks 2. Continue Ampicillin at current dose until April 24th 2017.  3. Follow up with Primary Dentist prior to heart valve surgery   Discharge Diagnoses:  Principal Problem:   Bacterial endocarditis Active Problems:   HTN (hypertension)   COPD (chronic obstructive pulmonary disease) (HCC)   Chronic diastolic CHF (congestive heart failure) (HCC)   Morbid obesity (HCC)   Insulin dependent diabetes mellitus ([place])   Essential hypertension   Hepatic cirrhosis (HCC)   Splenic infarct   Pulmonary hypertension assoc with unclear multi-factorial mechanisms (HCC)   Subarachnoid hemorrhage (HCC)   Leukocytosis   Diarrhea   Hyponatremia   Subarachnoid bleed (HCC)   Stroke (HCC)   Visual changes   Bacteremia   Cerebrovascular accident (CVA) due to embolism of cerebral artery (HCC)   Streptococcus viridans infection   Epigastric abdominal tenderness on direct palpation   Positive ANA (antinuclear antibody)   Acute respiratory failure with hypoxemia (HCC)   Right carotid artery occlusion   Tachypnea   Acute blood loss anemia   Acute on chronic diastolic CHF (congestive heart failure), NYHA class 1 ([place])  Discharge Condition: stable  Diet recommendation: Dysphagia 1  Filed Weights   02/11/16 0356 02/12/16 0644 02/13/16 0157  Weight: 111.4 kg (245 lb 9.5 oz) 107.1 kg (236 lb 1.8 oz) 117.9 kg (259 lb 14.8 oz)    History of present illness:  See H&P, Labs, Consult and Test reports for all details in brief, patient is a 60 year old female with past medical history of hypertension, hyperlipidemia, COPD, diabetes mellitus, and CHF. She recently admitted in February of this year  for acute splenic infarction without clear etiology. She was admitted on 3/11 with fever, headache and a small subarachnoid bleed as well as several small "punctate infarcts" consistent with embolic events. Blood cultures were positive for strep viridans at that point.  On 3/16 she had a mechanical fall and had a CT head that was read as normal around 1400. Apparently she was given ativan. After return from CT imaging she was noted to have left facial droop, slurred speech around 4PM. Stroke was called, CT head repeated, then IR was consulted for possible cerebral angiogram. She underwent an angiogram showing a R MCA embolism and occluded right internal carotid artery embolism. These were retrieved and then she underwent selective intra-arterial integrilin infusion.   Hospital Course:  Acute CVA - due to right ICA acute occlusion s/p endovascular thrombectomy of terminal RICA occlusion. Outpatient follow up with Neurology. SAH within the left parieto-occipital region, and 4 punctate infarcts at the watershed area including left MCA/PCA, bilateral MCA/ACA - likely due to endocarditis. Outpatient follow up with Neurology. Strep viridans MV endocarditis with regurgitation - on Ampicillin, needs IV antibiotics until 03/16/16. Tunneled CVC per Interventional radiology, patient refused regular PICC Line. TCV consulted, will eventually need valve repair, will have outpatient follow up with thoracic surgery and cardiology.  SAH - was on Xarelto on admission due to splenic infarct prior hospitalization, off anticoagulation now RLE pain - LE [place] negative for DVT. Pain resolved Groin wounds - appears at the site of vascular access, wound consult, recommendations as below, "Dressing procedure/placement/frequency: pack daily with Iodofrm packing strip to absorb drainage and provide antimicrobial  benefits and cover with gauze to protect site.". Treat with Diflucan for 10 days Acute respiratory failure - extubated  now, on 2L [place], stable DM - poorly controlled, continue SSI HTN - pressures normal, continue home medications HLD - continue statin   CULTURES: 3/11 blood > viridans strep 3/14 blood > negative  ANTIBIOTICS: 3/12 Ceftriaxone > 3/16 3/16 Ampicillin >    STUDIES/eVENTS 02/01/2016 - admit:  3/11 CT head > localized subarachnoid hemorrhage/debris in left parietal occipital region 3/11 CT angiogram> mild intracranial and extracranial atherosclerosis without stenosis, patent dural venous sinuses, no vascular malformation 3/11 MRI/MRA brain > small volume subarachnoid hemorrhage/debri left parieto-occipital region, few sub cm cortical subcortical ischemic infarcts in bilateral parietal lobes, negative MRA 3/14 EEG > no evidence of epileptiform discharges 3/15 TEE> 2 cm mitral valve vegetation with regurgitation 3/15 CT ab/pelvis> new splenic infarct, larger than before, enlarged spleen, diffuse hepatic steatosis, mosaic attenuation of lung bases, consider pulmonary arterial hypertension 3/16 CT head > No significant change from prior 3/16 CT head> decreased conspicuity of left parieto-occipital subarachnoid hemorrhage; ? New area of hemorrhage R parietal sulcus. ACUTE STROKE. INTUBATE 3/16 IR angiogram> R internal carotid and R MCA occlusion 3/16 CT head > unchanged small volume R parietal and left parieto occipital subarachnoid hemorrhage 3/17 Head Ct > interval decrease small vol R parietal and L parietal-occipital bleed, no new bleed, no acute infarct yet 3/18 MRI brain >> patchy multifocal right MCA infarct, scattered parenchymal hemorrhage, probable new left parietal cortical infarct, subacute left cerebellar hemisphere CVA, slight interval increase in prominence of subarachnoid hemorrhage in the left parieto-occipital region. EXTUBATE 3./20 - Afebrile. Denies pain. Good UO 3/22 TRH took over  Antimicrobials: 3/12 Ceftriaxone > 3/16 3/16 Ampicillin >     Consultations:  TCV  ID  Dentistry  PCCM  Discharge Exam: Filed Vitals:   02/14/16 1440 02/14/16 1445 02/14/16 1446 02/14/16 1447  BP: 130/77 122/81  122/80  Pulse: 80 76  80  Temp:      TempSrc:      Resp: '18 22  18  '$ Height:      Weight:      SpO2: 100% 100% 96% 97%    General: NAD Cardiovascular: RRR Respiratory: CTA biL  Discharge Instructions Activity:  As tolerated   Get Medicines reviewed and adjusted: Please take all your medications with you for your next visit with your Primary MD  Please request your Primary MD to go over all hospital tests and procedure/radiological results at the follow up, please ask your Primary MD to get all Hospital records sent to his/her office.  If you experience worsening of your admission symptoms, develop shortness of breath, life threatening emergency, suicidal or homicidal thoughts you must seek medical attention immediately by calling 911 or calling your MD immediately if symptoms less severe.  You must read complete instructions/literature along with all the possible adverse reactions/side effects for all the Medicines you take and that have been prescribed to you. Take any new Medicines after you have completely understood and accpet all the possible adverse reactions/side effects.   Do not drive when taking Pain medications.   Do not take more than prescribed Pain, Sleep and Anxiety Medications  Special Instructions: If you have smoked or chewed Tobacco in the last 2 yrs please stop smoking, stop any regular Alcohol and or any Recreational drug use.  Wear Seat belts while driving.  Please note  You were cared for by a hospitalist during your hospital stay. Once you  are discharged, your primary care physician will handle any further medical issues. Please note that NO REFILLS for any discharge medications will be authorized once you are discharged, as it is imperative that you return to your primary care physician  (or establish a relationship with a primary care physician if you do not have one) for your aftercare needs so that they can reassess your need for medications and monitor your lab values.     Discharge Instructions    Ambulatory referral to Neurology    Complete by:  As directed   Pt will follow up with Dr. Erlinda Hong at [facility] in about 2 months. Thanks.            Medication List    STOP taking these medications        ciprofloxacin 500 MG tablet  Commonly known as:  CIPRO     metroNIDAZOLE 500 MG tablet  Commonly known as:  FLAGYL     naproxen sodium 220 MG tablet  Commonly known as:  ANAPROX     Potassium 99 MG Tabs     Rivaroxaban 15 & 20 MG Tbpk  Commonly known as:  XARELTO STARTER PACK      TAKE these medications        albuterol 108 (90 Base) MCG/ACT inhaler  Commonly known as:  PROVENTIL HFA;VENTOLIN HFA  Inhale 2 puffs into the lungs every 4 (four) hours as needed. For shortness of breath.     albuterol (2.5 MG/3ML) 0.083% nebulizer solution  Commonly known as:  PROVENTIL  Take 2.5 mg by nebulization at bedtime.     Alogliptin-Metformin HCl 12.03-999 MG Tabs  Take 1 tablet by mouth 2 (two) times daily.     ampicillin 2 g in sodium chloride 0.9 % 50 mL  Inject 2 g into the vein every 4 (four) hours.     aspirin 81 MG chewable tablet  Chew 81 mg by mouth daily.     atenolol 50 MG tablet  Commonly known as:  TENORMIN  Take 12.5 mg by mouth 2 (two) times daily.     cholecalciferol 1000 units tablet  Commonly known as:  VITAMIN D  Take 1,000 Units by mouth 2 (two) times daily.     fluconazole 100 MG tablet  Commonly known as:  DIFLUCAN  Take 1 tablet (100 mg total) by mouth daily.     furosemide 40 MG tablet  Commonly known as:  LASIX  Take 40 mg by mouth daily.     ibuprofen 200 MG tablet  Commonly known as:  ADVIL,MOTRIN  Take 600 mg by mouth every 6 (six) hours as needed for headache or mild pain.     insulin NPH-regular Human (70-30) 100 UNIT/ML  injection  Commonly known as:  NOVOLIN 70/30  Inject 6-16 Units into the skin 2 (two) times daily with a meal.     loperamide 2 MG capsule  Commonly known as:  IMODIUM  Take 2 mg by mouth daily as needed for diarrhea or loose stools.     lovastatin 20 MG tablet  Commonly known as:  MEVACOR  Take 10 mg by mouth at bedtime.     nitroGLYCERIN 0.4 MG SL tablet  Commonly known as:  NITROSTAT  Place 1 tablet (0.4 mg total) under the tongue every 5 (five) minutes x 3 doses as needed for chest pain.     oxyCODONE-acetaminophen 10-325 MG tablet  Commonly known as:  PERCOCET  Take 1 tablet by mouth 2 (two)  times daily as needed for pain.     spironolactone 25 MG tablet  Commonly known as:  ALDACTONE  Take 50 mg by mouth daily.       Follow-up Information    Follow up with [facility] On 02/11/2016.   Why:  Follow up appointment scheduled on 02/11/16 at 3pm   Contact information:   [address] [place] [address] [phone]      Follow up with Xu,Jindong, MD. Schedule an appointment as soon as possible for a visit in 2 months.   Specialty:  Neurology   Why:  stroke clinic   Contact information:   [address] [place] [address] [phone]       The results of significant diagnostics from this hospitalization (including imaging, microbiology, ancillary and laboratory) are listed below for reference.    Significant Diagnostic Studies: Ct Angio Head W/cm &/or Wo Cm  02/01/2016  CLINICAL DATA:  Subarachnoid hemorrhage. Central vision loss associated with headache and lightheadedness. EXAM: CT ANGIOGRAPHY HEAD AND NECK TECHNIQUE: Multidetector CT imaging of the head and neck was performed using the standard protocol during bolus administration of intravenous contrast. Multiplanar CT image reconstructions and MIPs were obtained to evaluate the vascular anatomy. Carotid stenosis measurements (when applicable)  are obtained utilizing NASCET criteria, using the distal internal carotid diameter as the denominator. CONTRAST:  44m OMNIPAQUE IOHEXOL 350 MG/ML SOLN COMPARISON:  Head CT and head MRI/ MRA 02/01/2016 FINDINGS: CTA NECK Aortic arch: 3 vessel aortic arch with mild-to-moderate calcified plaque. No significant brachiocephalic or subclavian artery stenosis. Right carotid system: Mild mixed calcified and noncalcified plaque at the carotid bifurcation and in the proximal ICA resulting in mild luminal irregularity but no significant stenosis. Left carotid system: Mild atheromatous change throughout the mid and distal common carotid artery without significant stenosis. Mild calcified and noncalcified plaque in the proximal ICA with luminal irregularity but no stenosis. Vertebral arteries: The vertebral arteries are patent with the left being mildly to moderately dominant. Left vertebral artery is widely patent. The right vertebral artery origin is suboptimally evaluated due to motion artifact. There is no evidence of flow limiting stenosis, although mild origin stenosis is not excluded. Skeleton: Mild disc and facet degeneration in the lower cervical spine. Other neck: Partially visualized enlargement of the main pulmonary artery which may reflect underlying pulmonary arterial hypertension. Limited evaluation of the lung apices due to moderate motion artifact. Ground-glass attenuation may be largely due to motion, however underlying edema or infection is questioned; correlate with chest radiography. CTA HEAD Anterior circulation: Internal carotid arteries are patent from skullbase to carotid termini. There is mild carotid siphon calcified plaque bilaterally without stenosis. ACAs and MCAs are patent without evidence of major branch occlusion or significant stenosis. No intracranial aneurysm is identified. No vascular malformation is identified, including in the left parietal region at the site of previously demonstrated  subarachnoid hemorrhage. Posterior circulation: The intracranial vertebral arteries are patent to the basilar with the left being dominant. PICA and SCA origins are patent. Basilar artery is patent without stenosis. There is a small posterior communicating artery on the left. PCAs are patent with slight irregularity diffusely, which may be related to motion, without evidence of significant proximal stenosis. Venous sinuses: Patent. Anatomic variants: None. Delayed phase: Small volume left parietal subarachnoid hemorrhage as previously demonstrated. No abnormal enhancement identified. IMPRESSION: 1. Mild intracranial and extracranial atherosclerosis without significant stenosis. 2. Patent dural venous sinuses. No vascular malformation  identified. Electronically Signed   By: Logan Bores M.D.   On: 02/01/2016 09:37   Dg Chest 1 View  02/12/2016  CLINICAL DATA:  Congestive heart failure EXAM: CHEST 1 VIEW COMPARISON:  02/08/2016 FINDINGS: Stable cardiac enlargement. Vascular congestion is stable. There is mild diffuse interstitial prominence with peribronchial wall thickening. No pleural effusion. IMPRESSION: Congestive heart failure with mild pulmonary edema Electronically Signed   By: Skipper Cliche M.D.   On: 02/12/2016 10:59   Dg Chest 2 View  02/01/2016  CLINICAL DATA:  Cough EXAM: CHEST  2 VIEW COMPARISON:  01/14/2016 FINDINGS: Chronic cardiomegaly with pulmonary venous congestion. Negative aortic contours. There is no edema, consolidation, effusion, or pneumothorax. IMPRESSION: Cardiomegaly and pulmonary venous congestion. Electronically Signed   By: Monte Fantasia M.D.   On: 02/01/2016 02:49   Ct Head Wo Contrast  02/07/2016  CLINICAL DATA:  Stroke. Post revascularization occluded right internal carotid artery terminus and proximal right MCA EXAM: CT HEAD WITHOUT CONTRAST TECHNIQUE: Contiguous axial images were obtained from the base of the skull through the vertex without intravenous contrast.  COMPARISON:  CT head 02/07/2016 FINDINGS: Hypodensity right posterior limb internal capsule has become more apparent consistent with acute infarct. No other areas of acute infarct. Small volume subarachnoid hemorrhage difficult to see on today's study but remains present in the high right parietal region. No new area of hemorrhage. Ventricle size normal.  No shift of midline structures. Sinusitis with air-fluid level sphenoid sinus similar to earlier today. IMPRESSION: Acute infarct posterior limb internal capsule Small area of subarachnoid hemorrhage right parietal area stable. No new area of hemorrhage. Electronically Signed   By: Franchot Gallo M.D.   On: 02/07/2016 18:44   Ct Head Wo Contrast  02/07/2016  CLINICAL DATA:  Follow-up exam for acute stroke, status post catheter directed revascularization. EXAM: CT HEAD WITHOUT CONTRAST TECHNIQUE: Contiguous axial images were obtained from the base of the skull through the vertex without intravenous contrast. COMPARISON:  Prior CT from 02/06/2016 as well as multiple previous studies. FINDINGS: Previously identified small volume acute subarachnoid hemorrhage within the right parieto-occipital region is slightly less conspicuous on today's study. Additionally, known subarachnoid hemorrhage/ debris within the left occipital region not well seen. No new intracranial hemorrhage. No definite evolving large vessel territory infarct identified. No mass lesion or midline shift. No mass effect. No hydrocephalus. No extra-axial fluid collection. Scalp soft tissues within normal limits. No acute abnormality about the orbits. Scattered fluid levels with opacity within the sphenoid ethmoidal sinuses. Patient appears to be intubated. No mastoid effusion. Calvarium intact. IMPRESSION: 1. Interval decrease in conspicuity of small volume right parietal and left parieto-occipital hemorrhage. No new intracranial hemorrhage identified. 2. No definite acute or evolving infarct  identified by CT. Electronically Signed   By: Jeannine Boga M.D.   On: 02/07/2016 03:20   Ct Head Wo Contrast  02/06/2016  CLINICAL DATA:  She stroke. Left facial droop, left hemiplegia, slurred speech, left neglect, and right gaze. Status post cerebral angiography and revascularization of occluded right ICA terminus and right MCA. EXAM: CT HEAD WITHOUT CONTRAST TECHNIQUE: Contiguous axial images were obtained from the base of the skull through the vertex without intravenous contrast. COMPARISON:  Head CT 1743 hours today FINDINGS: Small volume right parietal subarachnoid hemorrhage is unchanged, as is small volume residual left parieto-occipital subarachnoid hemorrhage. No definite edema suggestive of acute infarct is identified. No mass, midline shift, or extra-axial fluid collection is seen. Orbits are unremarkable. An endotracheal tube is  partially visualized, with secretions noted in the posterior nasal cavity and pharynx. Mild mucosal thickening/small volume secretions are also noted in the right sphenoid sinus and right frontal sinus. Mastoid air cells are clear. IMPRESSION: 1. Unchanged small volume right parietal and left parieto-occipital subarachnoid hemorrhage. 2. No acute infarct apparent by CT. Electronically Signed   By: Logan Bores M.D.   On: 02/06/2016 21:59   Ct Head Wo Contrast  02/06/2016  CLINICAL DATA:  Stroke.  Left-sided weakness.  Endocarditis. EXAM: CT HEAD WITHOUT CONTRAST TECHNIQUE: Contiguous axial images were obtained from the base of the skull through the vertex without intravenous contrast. COMPARISON:  Head CT 02/06/2016 at 1400 hours. Head MRI and CT 02/01/2016. FINDINGS: There is a subcentimeter focus of hyperattenuation associated with a right parietal sulcus (series 2, image 27) which may reflect trace subarachnoid hemorrhage/ proteinaceous debris. This is unchanged from today's earlier CT but is new from 02/01/2016. Left parietal subarachnoid hemorrhage/debris on  the 02/01/2016 CT is much less conspicuous now. There is no evidence of acute large territory infarct, mass, midline shift, or extra-axial fluid collection. The punctate acute infarcts on the recent prior MRI are not well seen by CT. There is mild generalized cerebral atrophy. Orbits are unremarkable. Minimal paranasal sinus mucosal thickening is noted. The mastoid air cells are clear. No acute osseous abnormality is identified. Calcified atherosclerosis is noted at the skull base. IMPRESSION: 1. Decreased conspicuity of left parieto-occipital subarachnoid hemorrhage/debris compared to the studies from 02/01/2016. 2. Small focus of subarachnoid hemorrhage/debris in a right parietal sulcus, unchanged from today's earlier study but new from 02/01/2016. This may reflect a new focus of hemorrhage or redistribution of the pre-existing blood products. Electronically Signed   By: Logan Bores M.D.   On: 02/06/2016 18:26   Ct Head Wo Contrast  02/06/2016  CLINICAL DATA:  Slip and fall today with slurred speech and hypoxia EXAM: CT HEAD WITHOUT CONTRAST TECHNIQUE: Contiguous axial images were obtained from the base of the skull through the vertex without intravenous contrast. COMPARISON:  02/01/2016 FINDINGS: Bony calvarium is intact. No findings to suggest acute hemorrhage, acute infarction or space-occupying mass lesion is seen. Some gyral swelling is noted similar to that seen on the prior exam in the left posterior parietal region. This is stable from the prior exam. No acute abnormality is noted. IMPRESSION: No significant change from the prior exam. No acute hemorrhage is noted. Electronically Signed   By: Inez Catalina M.D.   On: 02/06/2016 14:09   Ct Head Wo Contrast  02/01/2016  CLINICAL DATA:  Headache and right eye blurred vision since beginning Xarelto. EXAM: CT HEAD WITHOUT CONTRAST TECHNIQUE: Contiguous axial images were obtained from the base of the skull through the vertex without intravenous contrast.  COMPARISON:  10/09/2013 FINDINGS: Skull and Sinuses:Negative for fracture or destructive process. The visualized mastoids, middle ears, and imaged paranasal sinuses are clear. Visualized orbits: Negative. Brain: There is unexpected subarachnoid high-density effacement in the left parietal occipital region, newly seen. This is likely fading subarachnoid hemorrhage. Reversible cerebral vasoconstrictive syndrome, posterior reversible encephalopathy syndrome (which would have to be occult by CT), or anti coagulation complication are primary considerations. No cortical or subcortical low-density. No evidence of acute infarct. No hydrocephalus or shift. These results were called by telephone at the time of interpretation on 02/01/2016 at 2:44 am to Dr. Noemi Chapel , who verbally acknowledged these results. IMPRESSION: Localized subarachnoid hemorrhage/debris in the left parietal occipital region as discussed above. MRI/MRA recommended. Electronically Signed  By: Marnee Spring M.D.   On: 02/01/2016 02:48   Ct Angio Neck W/cm &/or Wo/cm  02/01/2016  CLINICAL DATA:  Subarachnoid hemorrhage. Central vision loss associated with headache and lightheadedness. EXAM: CT ANGIOGRAPHY HEAD AND NECK TECHNIQUE: Multidetector CT imaging of the head and neck was performed using the standard protocol during bolus administration of intravenous contrast. Multiplanar CT image reconstructions and MIPs were obtained to evaluate the vascular anatomy. Carotid stenosis measurements (when applicable) are obtained utilizing NASCET criteria, using the distal internal carotid diameter as the denominator. CONTRAST:  54mL OMNIPAQUE IOHEXOL 350 MG/ML SOLN COMPARISON:  Head CT and head MRI/ MRA 02/01/2016 FINDINGS: CTA NECK Aortic arch: 3 vessel aortic arch with mild-to-moderate calcified plaque. No significant brachiocephalic or subclavian artery stenosis. Right carotid system: Mild mixed calcified and noncalcified plaque at the carotid  bifurcation and in the proximal ICA resulting in mild luminal irregularity but no significant stenosis. Left carotid system: Mild atheromatous change throughout the mid and distal common carotid artery without significant stenosis. Mild calcified and noncalcified plaque in the proximal ICA with luminal irregularity but no stenosis. Vertebral arteries: The vertebral arteries are patent with the left being mildly to moderately dominant. Left vertebral artery is widely patent. The right vertebral artery origin is suboptimally evaluated due to motion artifact. There is no evidence of flow limiting stenosis, although mild origin stenosis is not excluded. Skeleton: Mild disc and facet degeneration in the lower cervical spine. Other neck: Partially visualized enlargement of the main pulmonary artery which may reflect underlying pulmonary arterial hypertension. Limited evaluation of the lung apices due to moderate motion artifact. Ground-glass attenuation may be largely due to motion, however underlying edema or infection is questioned; correlate with chest radiography. CTA HEAD Anterior circulation: Internal carotid arteries are patent from skullbase to carotid termini. There is mild carotid siphon calcified plaque bilaterally without stenosis. ACAs and MCAs are patent without evidence of major branch occlusion or significant stenosis. No intracranial aneurysm is identified. No vascular malformation is identified, including in the left parietal region at the site of previously demonstrated subarachnoid hemorrhage. Posterior circulation: The intracranial vertebral arteries are patent to the basilar with the left being dominant. PICA and SCA origins are patent. Basilar artery is patent without stenosis. There is a small posterior communicating artery on the left. PCAs are patent with slight irregularity diffusely, which may be related to motion, without evidence of significant proximal stenosis. Venous sinuses: Patent.  Anatomic variants: None. Delayed phase: Small volume left parietal subarachnoid hemorrhage as previously demonstrated. No abnormal enhancement identified. IMPRESSION: 1. Mild intracranial and extracranial atherosclerosis without significant stenosis. 2. Patent dural venous sinuses. No vascular malformation identified. Electronically Signed   By: Sebastian Ache M.D.   On: 02/01/2016 09:37   Ct Abdomen W Contrast  02/05/2016  CLINICAL DATA:  60 year old female inpatient with epigastric/left abdominal pain. Splenic infarction on recent CT. EXAM: CT ABDOMEN WITH CONTRAST TECHNIQUE: Multidetector CT imaging of the abdomen was performed using the standard protocol following bolus administration of intravenous contrast. CONTRAST:  100 cc Omnipaque 300 IV. COMPARISON:  01/14/2016 CT abdomen/ pelvis. FINDINGS: Lower chest: No significant pulmonary nodules or acute consolidative airspace disease. There is stable mosaic attenuation at the lung bases. Stable trace layering right pleural effusion. Hepatobiliary: Diffuse hepatic steatosis. No liver mass. No definite liver surface irregularity. Normal gallbladder with no radiopaque cholelithiasis. No biliary ductal dilatation. Pancreas: Normal, with no mass or duct dilation. Spleen: There is new mild splenomegaly (craniocaudal splenic length 14.9  cm, increased from 12.6 cm on 02/29/2017). There is been continued evolution of the previously described splenic infarct involving the upper 50% of the spleen, which is now all fluid density and distended. There is a new large splenic infarct in the lower 50% of the spleen. Only approximately 25% of the splenic parenchyma remains perfused. Splenic artery and celiac trunk appear patent. Adrenals/Urinary Tract: Normal adrenals. Simple 2.1 cm anterior upper and 1.5 cm lower left renal cysts. No hydronephrosis. No new renal lesions. Stomach/Bowel: Grossly normal stomach. Visualized small and large bowel is normal caliber, with no bowel wall  thickening. Vascular/Lymphatic: Atherosclerotic nonaneurysmal abdominal aorta. Patent portal, splenic, hepatic and renal veins. No pathologically enlarged lymph nodes in the abdomen. Other: No pneumoperitoneum, ascites or focal fluid collection. Musculoskeletal: Re- demonstrated are endplate erosions at Q7-6 (series 6/ image 125), not appreciably changed since 01/14/2016, although new since 06/18/2012. Moderate degenerative changes in the visualized thoracolumbar spine. IMPRESSION: 1. 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 Evolution of the previously described splenic infarct involving the upper 50% of the spleen. Spleen is now mildly enlarged. Cardiac embolic source remains the most likely etiology. Splenic artery and vein appear patent. 2. Stable endplate erosions at P9-5, new since 2013, nonspecific. Consider further evaluation with thoracic spine MRI. 3. Diffuse hepatic steatosis. 4. Mosaic attenuation at the lung bases, favor mosaic perfusion due to chronic pulmonary arterial hypertension. Electronically Signed   By: Ilona Sorrel M.D.   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 EXAM: MRI HEAD WITHOUT CONTRAST MRA HEAD WITHOUT CONTRAST TECHNIQUE: Multiplanar, multiecho pulse sequences of the brain and surrounding structures were obtained without intravenous contrast. Angiographic images of the head were obtained using MRA technique without contrast. COMPARISON:  Prior studies from 02/06/2016 and 02/07/2015. FINDINGS: MRI HEAD FINDINGS Diffusion-weighted imaging demonstrates multi focal areas of patchy restricted diffusion involving the  right MCA distribution. Specifically, there are patchy cortical and subcortical infarcts within the right frontal lobe, right parietal lobe, and right temporoparietal region. 17 mm infarct present within the posterior limb of the right internal capsule extending into the mesial right temporal lobe and hippocampus. Patchy infarct within the right centrum semi ovale. These are new relative to most recent MRI. There is patchy associated petechial hemorrhage within a few areas of infarction as seen on SWI sequence (series 10, image 87). Probable small amount of acute subarachnoid hemorrhage within the right parietal lobe (series 10, image 86). This is stable relative to most recent CTs, but new relative to most recent MRI. Additional more faint diffusion abnormality within the subcortical left parietal lobe, consistent with more subacute infarct, evolved from previous MRI (series 4, image 25). There appears to be a new small 6 mm cortical infarct within the left parietal lobe within this region (series 4, image 24). Previously noted subarachnoid hemorrhage within the left parieto-occipital region again seen, more prominent on today's study (series 10, image 74). While this may be related to differences in technique, possible small amount of new hemorrhage within this region not entirely excluded. Probable subacute small left cerebellar infarct also noted (series 4, image 8), not seen on previous MRI. No other infarct. Major intracranial vascular flow voids  maintained. No mass lesion, midline shift, or significant mass effect. No hydrocephalus. No extra-axial fluid collection. Major dural sinuses are grossly patent. Craniocervical junction within normal limits. Pituitary gland normal. No acute abnormality about the orbits. Scattered mucosal thickening within the ethmoidal air cells and maxillary sinuses. Mucosal thickening with fluid levels present within the sphenoid sinuses. Patient appears to be intubated. Scattered  opacity within the mastoid air cells bilaterally, right slightly worse than left. Inner ear structures grossly normal. Bone marrow signal intensity within normal limits. No acute scalp soft tissue abnormality. MRA HEAD FINDINGS ANTERIOR CIRCULATION: Study degraded by motion artifact. Visualized distal cervical segments of the internal carotid arteries are patent with antegrade flow. Petrous, cavernous, and supraclinoid segments widely patent. A1 segments, anterior communicating artery, and anterior cerebral arteries well opacified. M1 segments patent without stenosis or occlusion. Left MCA bifurcation normal. There is irregularity with ill definition of the right MCA bifurcation. Question of a focal outpouching measuring approximately 6 mm. Unclear whether this finding is related to motion artifact with normal branch vessels, represents a possible new aneurysm (potentially mycotic in nature given the history of endocarditis). Aneurysm might be potentially less favored as this an abnormality appears fairly linear in AP dimension, as would not be expected with aneurysm. Possible residual or new thrombus could also be considered. The right MCA branches are opacified distally. POSTERIOR CIRCULATION: Vertebral arteries patent to the vertebrobasilar junction. Left vertebral artery dominant. Posterior inferior cerebral arteries patent bilaterally. Basilar artery widely patent. Superior cerebellar arteries patent bilaterally. Both posterior cerebral arteries arise from the basilar artery and are well opacified to their distal aspects. Mild intracranial atheromatous disease better evaluated on recent CTA. IMPRESSION: MRI HEAD IMPRESSION: 1. Patchy multi focal acute right MCA territory infarcts involving the right frontal, parietal, and temporal lobes as above, with involvement of the posterior limb of the right internal capsule. Scattered petechial hemorrhage about a few of these infarcts, with probable small volume  subarachnoid hemorrhage within the right parietal lobe, similar relative prior CTs, but new relative to most recent MRI. 2. Probable new subcentimeter cortical infarct within the left parietal lobe, with interval evolution of previously identified punctate subcortical left parietal infarcts. 3. Small subacute appearing infarct within the left cerebellar hemisphere, new relative to most recent MRI from 02/01/2016. 4. Slight interval increase in prominence of subarachnoid hemorrhage within the left parieto-occipital region. While this finding may be related to technique, the possibility of small amount of re- hemorrhage within this region should be considered. Attention at follow-up CTs recommended. MRA HEAD IMPRESSION: 1. No large or proximal arterial branch occlusion within the intracranial circulation. 2. Question new 6 mm abnormality at the level of the right MCA bifurcation. Unclear whether this reflects motion artifact in relation to normal branch vessels, small amount of residual or new thrombus, and less likely new aneurysm (although this is not entirely excluded, with mycotic aneurysm certainly a possibility given the history of endocarditis). Further evaluation with dedicated CTA recommended. 3. No other new abnormality within the intracranial circulation. Electronically Signed   By: Jeannine Boga M.D.   On: 02/08/2016 01:45   Mr Angiogram Head Wo Contrast  02/01/2016  CLINICAL DATA:  Initial evaluation for acute visual changes. EXAM: MRI HEAD WITHOUT CONTRAST MRA HEAD WITHOUT CONTRAST TECHNIQUE: Multiplanar, multiecho pulse sequences of the brain and surrounding structures were obtained without intravenous contrast. Angiographic images of the head were obtained using MRA technique without contrast. COMPARISON:  Prior CT from earlier the same day. FINDINGS: MRI  HEAD FINDINGS Study is degraded by motion artifact. Mild diffuse prominence of the CSF containing spaces is compatible with generalized  cerebral atrophy. Mild scattered T2/FLAIR hyperintensity within the periventricular and deep white matter present, most consistent with chronic small vessel ischemic disease, mild for age. There is subtle susceptibility artifact with hyperintense FLAIR signal intensity within the cortical sulci of the left parieto-occipital region, corresponding with abnormality seen on prior CT. Finding concerning for small volume subarachnoid hemorrhage/debris. Mild gyral swelling within this region without significant mass effect. No evidence for posterior reversible encephalopathy syndrome. No other hemorrhage seen elsewhere within the brain. Scattered small subcentimeter foci of restricted diffusion seen within the subcortical white matter and cortical gray matter of the bilateral parietal lobes (series 5, image 29, 32) additional small punctate focus of restricted diffusion more inferiorly at the level of the subarachnoid hemorrhage. Major intracranial vascular flow voids are maintained. No mass lesion, midline shift, or mass effect. No hydrocephalus. No extra-axial fluid collection. Craniocervical junction normal. Visualized upper cervical spine within normal limits. Pituitary gland normal. No acute abnormality about the orbits. Paranasal sinuses and mastoids are clear. Inner ear structures within normal limits. Bone marrow signal intensity normal. Scalp soft tissues unremarkable. MRA HEAD FINDINGS ANTERIOR CIRCULATION: Visualized distal cervical segments of the internal carotid arteries are patent with antegrade flow. Petrous, cavernous, and supraclinoid ICA is widely patent. A1 segments, anterior communicating artery, and anterior cerebral arteries well opacified. M1 segments widely patent without stenosis or occlusion. MCA branches normal. Distal MCA branches well opacified. POSTERIOR CIRCULATION: Vertebral arteries patent to the vertebrobasilar junction. Left vertebral artery dominant. Posterior inferior cerebral arteries  patent. Basilar artery well opacified to its distal aspect. Superior cerebellar and posterior cerebral arteries well opacified bilaterally. Small left posterior communicating artery noted. No aneurysm. No obvious high-grade stenosis with the intracranial circulation on this motion degraded study. IMPRESSION: MRI HEAD IMPRESSION: 1. Small volume subarachnoid hemorrhage/debris within the left parieto-occipital region, corresponding to abnormality seen on prior CT. Minimal gyral swelling within this region without significant mass effect or other abnormality. 2. Few sub cm cortical/subcortical ischemic infarcts within the bilateral parietal lobes as above. MRA HEAD IMPRESSION: Negative intracranial MRA. Electronically Signed   By: Jeannine Boga M.D.   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 EXAM: MRI HEAD WITHOUT CONTRAST MRA HEAD WITHOUT CONTRAST TECHNIQUE: Multiplanar, multiecho pulse sequences of the brain and surrounding structures were obtained without intravenous contrast. Angiographic images of the head were obtained using MRA technique without contrast. COMPARISON:  Prior studies from 02/06/2016 and 02/07/2015. FINDINGS: MRI HEAD FINDINGS Diffusion-weighted imaging demonstrates multi focal areas of patchy restricted diffusion involving the right MCA distribution. Specifically, there are patchy cortical and subcortical infarcts within the right frontal lobe, right parietal lobe, and right temporoparietal region. 17 mm infarct present within the posterior limb of the right internal capsule extending into the mesial right temporal lobe and hippocampus. Patchy infarct within  the right centrum semi ovale. These are new relative to most recent MRI. There is patchy associated petechial hemorrhage within a few areas of infarction as seen on SWI sequence (series 10, image 87). Probable small amount of acute subarachnoid hemorrhage within the right parietal lobe (series 10, image 86). This is  stable relative to most recent CTs, but new relative to most recent MRI. Additional more faint diffusion abnormality within the subcortical left parietal lobe, consistent with more subacute infarct, evolved from previous MRI (series 4, image 25). There appears to be a new small 6 mm cortical infarct within the left parietal lobe within this region (series 4, image 24). Previously noted subarachnoid hemorrhage within the left parieto-occipital region again seen, more prominent on today's study (series 10, image 74). While this may be related to differences in technique, possible small amount of new hemorrhage within this region not entirely excluded. Probable subacute small left cerebellar infarct also noted (series 4, image 8), not seen on previous MRI. No other infarct. Major intracranial vascular flow voids maintained. No mass lesion, midline shift, or significant mass effect. No hydrocephalus. No extra-axial fluid collection. Major dural sinuses are grossly patent. Craniocervical junction within normal limits. Pituitary gland normal. No acute abnormality about the orbits. Scattered mucosal thickening within the ethmoidal air cells and maxillary sinuses. Mucosal thickening with fluid levels present within the sphenoid sinuses. Patient appears to be intubated. Scattered opacity within the mastoid air cells bilaterally, right slightly worse than left. Inner ear structures grossly normal. Bone marrow signal intensity within normal limits. No acute scalp soft tissue abnormality. MRA HEAD FINDINGS ANTERIOR CIRCULATION: Study degraded by motion artifact. Visualized distal cervical segments of the internal  carotid arteries are patent with antegrade flow. Petrous, cavernous, and supraclinoid segments widely patent. A1 segments, anterior communicating artery, and anterior cerebral arteries well opacified. M1 segments patent without stenosis or occlusion. Left MCA bifurcation normal. There is irregularity with ill definition of the right MCA bifurcation. Question of a focal outpouching measuring approximately 6 mm. Unclear whether this finding is related to motion artifact with normal branch vessels, represents a possible new aneurysm (potentially mycotic in nature given the history of endocarditis). Aneurysm might be potentially less favored as this an abnormality appears fairly linear in AP dimension, as would not be expected with aneurysm. Possible residual or new thrombus could also be considered. The right MCA branches are opacified distally. POSTERIOR CIRCULATION: Vertebral arteries patent to the vertebrobasilar junction. Left vertebral artery dominant. Posterior inferior cerebral arteries patent bilaterally. Basilar artery widely patent. Superior cerebellar arteries patent bilaterally. Both posterior cerebral arteries arise from the basilar artery and are well opacified to their distal aspects. Mild intracranial atheromatous disease better evaluated on recent CTA. IMPRESSION: MRI HEAD IMPRESSION: 1. Patchy multi focal acute right MCA territory infarcts involving the right frontal, parietal, and temporal lobes as above, with involvement of the posterior limb of the right internal capsule. Scattered petechial hemorrhage about a few of these infarcts, with probable small volume subarachnoid hemorrhage within the right parietal lobe, similar relative prior CTs, but new relative to most recent MRI. 2. Probable new subcentimeter cortical infarct within the left parietal lobe, with interval evolution of previously identified punctate subcortical left parietal infarcts. 3. Small subacute appearing infarct within the left  cerebellar hemisphere, new relative to most recent MRI from 02/01/2016. 4. Slight interval increase in prominence of subarachnoid hemorrhage within the left parieto-occipital region. While this finding may be related to technique, the possibility of small amount of re- hemorrhage within this region should be considered. Attention at follow-up CTs recommended. MRA HEAD IMPRESSION: 1. No large or proximal arterial branch occlusion within the intracranial circulation. 2. Question new 6 mm abnormality at the level of the right MCA bifurcation. Unclear whether this reflects motion artifact in relation to normal branch  vessels, small amount of residual or new thrombus, and less likely new aneurysm (although this is not entirely excluded, with mycotic aneurysm certainly a possibility given the history of endocarditis). Further evaluation with dedicated CTA recommended. 3. No other new abnormality within the intracranial circulation. Electronically Signed   By: Jeannine Boga M.D.   On: 02/08/2016 01:45   Mr Brain Wo Contrast  02/01/2016  CLINICAL DATA:  Initial evaluation for acute visual changes. EXAM: MRI HEAD WITHOUT CONTRAST MRA HEAD WITHOUT CONTRAST TECHNIQUE: Multiplanar, multiecho pulse sequences of the brain and surrounding structures were obtained without intravenous contrast. Angiographic images of the head were obtained using MRA technique without contrast. COMPARISON:  Prior CT from earlier the same day. FINDINGS: MRI HEAD FINDINGS Study is degraded by motion artifact. Mild diffuse prominence of the CSF containing spaces is compatible with generalized cerebral atrophy. Mild scattered T2/FLAIR hyperintensity within the periventricular and deep white matter present, most consistent with chronic small vessel ischemic disease, mild for age. There is subtle susceptibility artifact with hyperintense FLAIR signal intensity within the cortical sulci of the left parieto-occipital region, corresponding with  abnormality seen on prior CT. Finding concerning for small volume subarachnoid hemorrhage/debris. Mild gyral swelling within this region without significant mass effect. No evidence for posterior reversible encephalopathy syndrome. No other hemorrhage seen elsewhere within the brain. Scattered small subcentimeter foci of restricted diffusion seen within the subcortical white matter and cortical gray matter of the bilateral parietal lobes (series 5, image 29, 32) additional small punctate focus of restricted diffusion more inferiorly at the level of the subarachnoid hemorrhage. Major intracranial vascular flow voids are maintained. No mass lesion, midline shift, or mass effect. No hydrocephalus. No extra-axial fluid collection. Craniocervical junction normal. Visualized upper cervical spine within normal limits. Pituitary gland normal. No acute abnormality about the orbits. Paranasal sinuses and mastoids are clear. Inner ear structures within normal limits. Bone marrow signal intensity normal. Scalp soft tissues unremarkable. MRA HEAD FINDINGS ANTERIOR CIRCULATION: Visualized distal cervical segments of the internal carotid arteries are patent with antegrade flow. Petrous, cavernous, and supraclinoid ICA is widely patent. A1 segments, anterior communicating artery, and anterior cerebral arteries well opacified. M1 segments widely patent without stenosis or occlusion. MCA branches normal. Distal MCA branches well opacified. POSTERIOR CIRCULATION: Vertebral arteries patent to the vertebrobasilar junction. Left vertebral artery dominant. Posterior inferior cerebral arteries patent. Basilar artery well opacified to its distal aspect. Superior cerebellar and posterior cerebral arteries well opacified bilaterally. Small left posterior communicating artery noted. No aneurysm. No obvious high-grade stenosis with the intracranial circulation on this motion degraded study. IMPRESSION: MRI HEAD IMPRESSION: 1. Small volume  subarachnoid hemorrhage/debris within the left parieto-occipital region, corresponding to abnormality seen on prior CT. Minimal gyral swelling within this region without significant mass effect or other abnormality. 2. Few sub cm cortical/subcortical ischemic infarcts within the bilateral parietal lobes as above. MRA HEAD IMPRESSION: Negative intracranial MRA. Electronically Signed   By: Jeannine Boga M.D.   On: 02/01/2016 06:47   Ir Fluoro Guide Cv Line Right  02/14/2016  INDICATION: Poor venous access. Request made for placement of a tunneled central venous catheter for antibiotic administration. EXAM: TUNNELED PICC PLACEMENT WITH ULTRASOUND AND FLUOROSCOPIC GUIDANCE MEDICATIONS: The patient is currently admitted to the hospital and receiving intravenous antibiotics. The antibiotic was given in an appropriate time interval prior to skin puncture. ANESTHESIA/SEDATION: Moderate (conscious) sedation was employed during this procedure. A total of Versed 1 mg was administered intravenously. Moderate Sedation Time: 18 minutes. [name]  level of consciousness and vital signs were monitored continuously by radiology nursing throughout the procedure under my direct supervision. FLUOROSCOPY TIME:  Fluoroscopy Time: 42 seconds (3.6 mGy). COMPLICATIONS: None immediate. PROCEDURE: Informed written consent was obtained from the patient after a discussion of the risks, benefits, and alternatives to treatment. Questions regarding the procedure were encouraged and answered. The right neck and chest were prepped with chlorhexidine in a sterile fashion, and a sterile drape was applied covering the operative field. Maximum barrier sterile technique with sterile gowns and gloves were used for the procedure. A timeout was performed prior to the initiation of the procedure. After creating a small venotomy incision, a micropuncture kit was utilized to access the right internal jugular vein under direct, real-time  ultrasound guidance after the overlying soft tissues were anesthetized with 1% lidocaine with epinephrine. Ultrasound image documentation was performed. The microwire was kinked to measure appropriate catheter length. A stiff Glidewire was advanced to the level of the IVC and the micropuncture sheath was exchanged for a peel-away sheath. A tunneled dual-lumen catheter measuring 23 cm from tip to cuff was tunneled in a retrograde fashion from the anterior chest wall to the venotomy incision. The catheter was then placed through the peel-away sheath with tips ultimately positioned within the superior caval atrial junction. Final catheter positioning was confirmed and documented with a spot radiographic image. The catheter aspirates and flushes normally. The catheter was flushed with appropriate volume heparin dwells. The catheter exit site was secured with a 0-Prolene retention suture. The venotomy incision was closed with an interrupted 4-0 Vicryl, Dermabond and Steri-strips. Dressings were applied. The patient tolerated the procedure well without immediate post procedural complication. IMPRESSION: Successful placement of 23 cm tip to cuff tunneled dual-lumen PICC via the right internal jugular vein with tips terminating within the superior caval atrial junction. The catheter is ready for immediate use. Electronically Signed   By: Sandi Mariscal M.D.   On: 02/14/2016 15:19   Ir US Guide Vasc Access Right  02/14/2016  INDICATION: Poor venous access. Request made for placement of a tunneled central venous catheter for antibiotic administration. EXAM: TUNNELED PICC PLACEMENT WITH ULTRASOUND AND FLUOROSCOPIC GUIDANCE MEDICATIONS: The patient is currently admitted to the hospital and receiving intravenous antibiotics. The antibiotic was given in an appropriate time interval prior to skin puncture. ANESTHESIA/SEDATION: Moderate (conscious) sedation was employed during this procedure. A total of Versed 1 mg was administered  intravenously. Moderate Sedation Time: 18 minutes. [name] level of consciousness and vital signs were monitored continuously by radiology nursing throughout the procedure under my direct supervision. FLUOROSCOPY TIME:  Fluoroscopy Time: 42 seconds (3.6 mGy). COMPLICATIONS: None immediate. PROCEDURE: Informed written consent was obtained from the patient after a discussion of the risks, benefits, and alternatives to treatment. Questions regarding the procedure were encouraged and answered. The right neck and chest were prepped with chlorhexidine in a sterile fashion, and a sterile drape was applied covering the operative field. Maximum barrier sterile technique with sterile gowns and gloves were used for the procedure. A timeout was performed prior to the initiation of the procedure. After creating a small venotomy incision, a micropuncture kit was utilized to access the right internal jugular vein under direct, real-time ultrasound guidance after the overlying soft tissues were anesthetized with 1% lidocaine with epinephrine. Ultrasound image documentation was performed. The microwire was kinked to measure appropriate catheter length. A stiff Glidewire was advanced to the level of the IVC and the micropuncture sheath was exchanged for a  peel-away sheath. A tunneled dual-lumen catheter measuring 23 cm from tip to cuff was tunneled in a retrograde fashion from the anterior chest wall to the venotomy incision. The catheter was then placed through the peel-away sheath with tips ultimately positioned within the superior caval atrial junction. Final catheter positioning was confirmed and documented with a spot radiographic image. The catheter aspirates and flushes normally. The catheter was flushed with appropriate volume heparin dwells. The catheter exit site was secured with a 0-Prolene retention suture. The venotomy incision was closed with an interrupted 4-0 Vicryl, Dermabond and Steri-strips. Dressings were  applied. The patient tolerated the procedure well without immediate post procedural complication. IMPRESSION: Successful placement of 23 cm tip to cuff tunneled dual-lumen PICC via the right internal jugular vein with tips terminating within the superior caval atrial junction. The catheter is ready for immediate use. Electronically Signed   By: Simonne Come M.D.   On: 02/14/2016 15:19   Dg Chest Port 1 View  02/08/2016  CLINICAL DATA:  Acute respiratory failure EXAM: PORTABLE CHEST 1 VIEW COMPARISON:  February 06, 2016 FINDINGS: The ET tube is in good position. No pneumothorax. No focal infiltrate. Stable cardiomediastinal silhouette. There is a small left effusion with associated atelectasis. IMPRESSION: The ETT is in good position. A tiny left effusion with atelectasis is seen. Mild pulmonary venous congestion, improved in the interval. Electronically Signed   By: Gerome Sam III M.D   On: 02/08/2016 08:09   Portable Chest Xray  02/06/2016  CLINICAL DATA:  Acute respiratory failure. EXAM: PORTABLE CHEST 1 VIEW COMPARISON:  Earlier the same day FINDINGS: New endotracheal tube is in good position, tip between the clavicular heads and carina. Diffuse interstitial coarsening with small left effusion. Chronic cardiomegaly. IMPRESSION: 1. New endotracheal tube in good position. 2. CHF pattern with modest progression from earlier today. Small left effusion. Electronically Signed   By: Marnee Spring M.D.   On: 02/06/2016 22:38   Dg Chest Port 1 View  02/06/2016  CLINICAL DATA:  Shortness of breath today. EXAM: PORTABLE CHEST 1 VIEW COMPARISON:  02/01/2016 FINDINGS: The heart is enlarged but stable. There is tortuosity, ectasia and calcification of the thoracic aorta. There is vascular congestion and interstitial pulmonary edema. No definite pleural effusions. The bony thorax is intact. IMPRESSION: Mild CHF. Electronically Signed   By: Rudie Meyer M.D.   On: 02/06/2016 15:19   Ir Percutaneous Art  Thrombectomy/infusion Intracranial Inc Diag Angio  02/13/2016  CLINICAL DATA:  Right-sided gaze preference, left-sided weakness. Abnormal CT angiogram of the brain. EXAM: IR PERCUTANEOUS ART THORMBECTOMY/INFUSION INTRACRANIAL INCLUDE DIAG ANGIO. RIGHT VERTEBRAL ARTERY ANGIOGRAM AND RIGHT COMMON CAROTID ARTERIOGRAM FOLLOWED BY ENDOVASCULAR COMPLETE REVASCULARIZATION OF T OCCLUSION OF THE RIGHT INTERNAL CAROTID ARTERY TERMINUS, AND RIGHT MIDDLE CEREBRAL ARTERY USING SUPERSELECTIVE INTRACRANIAL INTRA-ARTERIAL TPA AND MECHANICAL THROMBECTOMY X3 PASSES. PROCEDURE: Contrast: Omnipaque 300 approximately 75 mL. Anesthesia/Sedation:  General anesthesia. Medications: Medications as per general anesthesia. Following a full explanation of the procedure along with the potential associated complications, an informed witnessed consent was obtained. Risks of intracranial hemorrhage of 10-15%, worsening neurological deficit, ventilator dependency, death and inability to revascularize were all reviewed in detail with the patient's family. The patient was then put under general anesthesia by the Department of Anesthesiology at [facility]. The right groin was prepped and draped in the usual sterile fashion. Thereafter using modified Seldinger technique, transfemoral access was obtained into the right common femoral artery. Over a 0.035 inch guidewire, a 5 [place] Pinnacle sheath  was inserted. Through this, and also over a 0.035 inch guidewire, a 5 [place] JB 1 catheter was advanced to the aortic arch region and selectively positioned in the right subclavian artery and right common carotid artery. Arteriograms were then performed intra and extra cranially. There were no acute complications. The patient tolerated the procedure well. FINDINGS: The right vertebral artery origin is normal. The vessel is seen to opacify normally to the cranial skull base. Patency of the right vertebrobasilar junction to the level of the right  posterior-inferior cerebellar artery is noted. The right common carotid arteriogram demonstrates the right external carotid artery to be mildly narrowed at its origin. Its branches are widely patent. The right internal carotid artery at the bulb has a smooth shallow plaque with a less than 20% narrowing by the NASCET criteria. 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 ENDOVASCULAR COMPLETE REVASCULARIZATION OF THE OCCLUDED RIGHT INTERNAL CAROTID ARTERY TERMINUS AND THE RIGHT MIDDLE CEREBRAL ARTERY USING SUPERSELECTIVE INTRACRANIAL INTRA-ARTERIAL TPA, AND MECHANICAL THROMBECTOMY USING THE SOLITAIRE FR 4 X 20 AND 6 X 30 RETRIEVAL DEVICES: The diagnostic JB 1 catheter in the right common carotid artery was then exchanged for a 0.035 inch 300 cm Rosen exchange guidewire for an 8 [place] 55 cm BriteTip neurovascular sheath using biplane roadmap technique and constant fluoroscopic guidance. Good aspiration was obtained from the side port of the 8 French neurovascular sheath. A gentle contrast injection demonstrated no evidence of spasms, dissections or of intraluminal filling defects. This was then connected to continuous heparinized saline infusion. Over the [employer] guidewire, an 8 [place], 85 cm Flowgate balloon guide catheter which had been prepped with 50% contrast and 50% percent heparinized saline infusion was then advanced and positioned in the right common carotid artery. The guidewire was removed. Again good aspiration was obtained from the hub of the 8 [place] Flowgate guide catheter. A gentle contrast injection demonstrated no evidence of spasms, dissections or of intraluminal filling defects. Over a 0.035 inch roadrunner guidewire, using biplane roadmap technique and constant fluoroscopic guidance, the Flowgate guide catheter was then advanced to the junction of the middle and  the proximal 1/3 of the right internal carotid artery. Again good aspiration was obtained from the hub of the 8 [place] Flowgate guide catheter. A control arteriogram performed intracranially demonstrated no change in the intracranial circulation. At this time, in a coaxial manner and with constant heparinized saline infusion using biplane roadmap technique and constant fluoroscopic guidance, a combination of Catalyst 5 [place] guide catheter with a Trevo-ProVue 021 microcatheter inside it was then advanced over a 0.014 inch Softtip Synchro micro guidewire to the distal end of the 5 [place] Catalyst guide catheter which is now at the level of the proximal cavernous segment. With the micro guidewire leading with a J-tip configuration to avoid dissections or inducing spasm, access was obtained without difficulty into the distal M2-M3 region of the inferior division of the right middle cerebral artery followed by the microcatheter. The guidewire was removed. Good aspiration obtained from the hub of the microcatheter. At this time, approximately 5.5 mg of superselective intracranial intraarterial tPA was then infused over approximately 6 minutes in 5.5 mL of heparinized normal saline. At this time, a 4 x 40 mm solitaire FR stent retrieval device was then prepped and purged with heparinized saline infusion and advanced to the distal end of the microcatheter.  A retrieval device was then deployed in the usual manner loosening the O rings on the delivery micro guidewire and the delivery microcatheter. With the right hand applying slight forward gentle traction, with the left hand, the delivery microcatheter was retrieved unsheathing the device to where the distal end of the microcatheter was just proximal to the proximal marker on the retrieval device. This was left deployed for approximately 3 minutes. A control arteriogram performed through the 5 [place] Catalyst guide catheter demonstrated complete revascularization of the  vessel distally. Proximal flow arrest was then applied by inflating the balloon in the right internal carotid artery of the Flowgate guide catheter. The proximal portion of the retrieval device was then captured into the microcatheter. The combination was then retrieved with the 5 [place] Catalyst guide catheter as constant aspiration with a 60 mL syringe was then applied at the hub of the 8 [place] Flowgate guide catheter. The aspirate contained a few chunks of clot. Aspiration was continued as the proximal flow arrest was released. 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 However there was now free flow via the ophthalmic artery at the cavernous and the supraclinoid right ICA with flow noted unimpeded into the right middle cerebral artery distribution This prompted again the advancement of the combination of the Trevo-ProVue microcatheter 021 over a 0.014 inch Softip Synchro micro guidewire inside of again a 5 [place] Catalyst guide catheter. The retrieval device was now deployed in the proximal right M1 segment extending into the cavernous segment of the right internal carotid artery. Again with proximal flow arrest by inflating the balloon in the right internal carotid artery of the Flowgate guide catheter, constant aspiration was applied with a 60 mL syringe as a combination of retrieval device, the microcatheter and the 5 [place] Catalyst guide catheter was performed. Aspiration was continued as the proximal flow arrest was released. A control arteriogram performed at this time revealed retrograde flow via the ophthalmic artery into the cervical petrous junction. A Trevo 021 microcatheter was then deployed over a 0.014 inch Softip Synchro micro guidewire using biplane roadmap technique and constant fluoroscopic guidance such that distal end of the  microcatheter was now in the proximal cavernous segment. The micro guidewire was removed. There was good aspiration of blood from the hub of the microcatheter. This time a 6 x 30 mm FR retrieval device was then advanced and deployed as described earlier such that the distal end of the retrieval device was in the petrous segment and the proximal extended all the way to the junction of the distal and the proximal 1/3 of the right internal carotid artery. Again with proximal flow arrest being applied in the right internal carotid artery proximally with a Flowgate catheter, constant aspiration with a 60 mL syringe was performed at the side port of the hub of the 8 [place] Flowgate guide catheter as the retrieval device with the microcatheter was gently retrieved and removed. Aspiration was continued as the proximal flow arrest was released of the Flowgate guide catheter. The guide catheter was retrieved into the distal right common carotid artery. There was difficulty in aspirating from the Flowgate balloon guide catheter suggesting occluded occlusion secondary to thrombus. This was then retrieved and removed. A diagnostic JB 1 catheter was then advanced over a 0.035 inch guidewire into the right common carotid artery. Control arteriogram performed centered intra  and extracranially demonstrated complete angiographic revascularization of the right internal carotid artery, right middle cerebral artery and the right anterior cerebral artery. No filling defects or occlusions were seen. There is no evidence of extravasation. No mass-effect of the intracranial vasculature was seen. Throughout the procedure, the patient's hemodynamic status and neurological status remained stable. The 8 [place] BriteTip neurovascular sheath was then retrieved into the abdominal aorta and exchanged over a J-tip guidewire for a 9 French Pinnacle sheath. This was then connected to continuous heparinized saline infusion. The patient was then  transferred to the CT scanner for postprocedural CT scan of the brain. 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 TICI 3 revascularization was achieved. Electronically Signed   By: Luanne Bras M.D.   On: 02/12/2016 12:45    Microbiology: Recent Results (from the past 240 hour(s))  Culture, Urine     Status: None   Collection Time: 02/11/16  8:41 AM  Result Value Ref Range Status   Specimen Description URINE, CLEAN CATCH  Final   Special Requests NONE  Final   Culture 4,000 COLONIES/mL INSIGNIFICANT GROWTH  Final   Report Status 02/12/2016 FINAL  Final     Labs: Basic Metabolic Panel:  Recent Labs Lab 02/08/16 0338 02/11/16 0439 02/12/16 0550 02/12/16 1250 02/13/16 0435 02/13/16 0921  NA 139 138 140 139 137  --   K 3.9 3.7 3.9 4.1 3.9  --   CL 98* 97* 98* 99* 97*  --   CO2 '28 31 30 '$ 32 31  --   GLUCOSE 153* 163* 177* 151* 166*  --   BUN '7 15 14 10 12  '$ --   CREATININE 0.88 0.91 0.91 0.94 0.91  --   CALCIUM 10.4* 10.7* 10.6* 10.5* 10.5*  --   MG  --   --   --   --   --  2.3   Liver Function Tests:  Recent Labs Lab 02/12/16 0550  AST 16  ALT 14  ALKPHOS 81  BILITOT 0.4  PROT 6.3*  ALBUMIN 2.4*   CBC:  Recent Labs Lab 02/08/16 0338 02/09/16 1252 02/11/16 0439 02/12/16 0550 02/13/16 0435  WBC 20.9* 16.1* 16.3* 16.5* 16.0*  NEUTROABS  --  13.0*  --   --   --   HGB 10.9* 11.0* 11.3* 11.2* 11.1*  HCT 36.4 37.4 38.1 37.4 38.3  MCV 89.9 90.3 89.0 90.6 91.0  PLT 454* 540* 583* 571* 616*   BNP: BNP (last 3 results)  Recent Labs  02/12/16 1250  BNP 137.3*   CBG:  Recent Labs Lab 02/13/16 0734 02/13/16 1116 02/13/16 1649 02/13/16 1947 02/14/16 0754  GLUCAP  151* 143* 204* 160* 146*   Signed:  GHERGHE, COSTIN  [facility] 02/14/2016, 4:55 PM

## 2016-02-14 NOTE — Sedation Documentation (Signed)
Pt has panic attacks, will give Versed per MD to continue preocedure. Family agrees.

## 2016-02-14 NOTE — Clinical Social Work Note (Signed)
Clinical Social Worker facilitated patient discharge including contacting patient family and facility to confirm patient discharge plans.  Clinical information faxed to facility and family agreeable with plan.  CSW arranged ambulance transport via [facility] to [facility] .  RN to call report prior to discharge.  Clinical Social Worker will sign off for now as social work intervention is no longer needed. Please consult us again if new need arises.  Jesse Porter Moes, LCSW [phone] 

## 2016-02-14 NOTE — Sedation Documentation (Signed)
Pt doing well with procedure. VS stable

## 2016-02-25 ENCOUNTER — Telehealth (HOSPITAL_COMMUNITY): Payer: Self-pay

## 2016-02-25 NOTE — Telephone Encounter (Signed)
Called to schedule f/u with Dr. Estanislado Pandy, vm was full, will try to call pt back later this week. AW

## 2016-03-05 NOTE — Progress Notes (Signed)
Cardiology Office Note:    Date:  03/06/2016   ID:  Margaret [name], DOB 1956-07-22, MRN [id number]  PCP:  Harvie Junior, MD  Cardiologist:  Dr. Ena Dawley   Electrophysiologist:  n/a  Referring MD: Harvie Junior, MD   Chief Complaint  Patient presents with  . Hospitalization Follow-up    MV endocarditis, Mitral Regurg with septic emboli     History of Present Illness:     [name] is a 60 y.o. female with a hx of COPD, HFpEF, OSA, HTN, HL, poorly controlled diabetes, tobacco abuse.   She was admitted 2/21-2/23 with splenic infarct noted on CT. Venous duplex was negative for DVT. Echocardiogram demonstrated normal LV function and no source for embolus. She was placed on Xarelto for anticoagulation. Outpatient TEE was recommended. She was noted to have cirrhosis on CT scan probably related to fatty liver disease. She followed up with hematology 01/23/16. Notes indicate hyper coagulability panel was negative. Splenic infarct was thought to be possibly related to congestion from cirrhosis with possibility of peripheral arterial disease. There was concern for chronic emboli resulting in pulmonary hypertension as well.   She was then admitted [id number] with embolic CVA and associated subarachnoid hemorrhage. She was noted to have strep viridans bacteremia and her presentation was felt to be related to septic emboli. TEE was performed and demonstrated mitral valve endocarditis with associated severe mitral regurgitation. Hospitalization was complicated by recurrent splenic infarct.  She was seen by cardiology, cardiothoracic surgery and infectious disease as well as neurology.  Her hospital stay was further complicated by right brain infarct secondary to acute occlusion of the RCA. She underwent endovascular thrombectomy by IR. It was felt that she would need to undergo rehabilitation with significant recovery before proceeding to mitral valve surgery. Anticoagulation  was not resumed as her infarcts were related to septic emboli. She would need IV ampicillin through 03/16/16. She was discharged to [facility].   Returns for FU.     Past Medical History  Diagnosis Date  . Hypertension   . Hyperlipidemia   . COPD (chronic obstructive pulmonary disease) ([place]) 04/20/12  . Asthma   . Type II diabetes mellitus ([place])   . Anemia   . H/O hiatal hernia   . Osteoarthritis   . Pneumonia 04/2012  . Migraines   . Panic attacks 04/20/12  . Morbid obesity ([place])   . Heart murmur   . Bacterial endocarditis     Strep viridans   . Chronic diastolic CHF (congestive heart failure) ([place]) 04/27/2012  . Pulmonary hypertension assoc with unclear multi-factorial mechanisms ([place]) 01/23/2016  . Positive ANA (antinuclear antibody)   . 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 Stroke ([place])   . Subarachnoid bleed ([place]) 02/01/2016    Past Surgical History  Procedure Laterality Date  . Laceration repair  1966    "right upper arm"  . Dilation and curettage of uterus  1990's  . Finger reattachment  1973    "right ring finger"  . Tee without cardioversion N/A 02/05/2016    Procedure: TRANSESOPHAGEAL ECHOCARDIOGRAM (TEE);  Surgeon: Larey Dresser, MD;  Location: [place];  Service: Cardiovascular;  Laterality: N/A;  . Radiology with anesthesia N/A 02/06/2016    Procedure: RADIOLOGY WITH ANESTHESIA;  Surgeon: Luanne Bras, MD;  Location: [place];  Service: Radiology;  Laterality: N/A;    Current Medications: Outpatient Prescriptions Prior to Visit  Medication Sig Dispense Refill  . albuterol (PROVENTIL HFA;VENTOLIN HFA) 108 (90 BASE) MCG/ACT inhaler  Inhale 2 puffs into the lungs every 4 (four) hours as needed. For shortness of breath.    Marland Kitchen albuterol (PROVENTIL) (2.5 MG/3ML) 0.083% nebulizer solution Take 2.5 mg by nebulization at bedtime.     . Alogliptin-Metformin HCl 12.03-999 MG TABS Take 1 tablet by mouth 2 (two) times daily.    Marland Kitchen ampicillin 2 g in sodium chloride 0.9 % 50  mL Inject 2 g into the vein every 4 (four) hours.    Marland Kitchen aspirin 81 MG chewable tablet Chew 81 mg by mouth daily.    Marland Kitchen atenolol (TENORMIN) 50 MG tablet Take 12.5 mg by mouth 2 (two) times daily.     . cholecalciferol (VITAMIN D) 1000 units tablet Take 1,000 Units by mouth 2 (two) times daily.    . fluconazole (DIFLUCAN) 100 MG tablet Take 1 tablet (100 mg total) by mouth daily.    . furosemide (LASIX) 40 MG tablet Take 40 mg by mouth daily.     Marland Kitchen ibuprofen (ADVIL,MOTRIN) 200 MG tablet Take 600 mg by mouth every 6 (six) hours as needed for headache or mild pain.    Marland Kitchen insulin NPH-regular (NOVOLIN 70/30) (70-30) 100 UNIT/ML injection Inject 6-16 Units into the skin 2 (two) times daily with a meal.    . loperamide (IMODIUM) 2 MG capsule Take 2 mg by mouth daily as needed for diarrhea or loose stools.    . lovastatin (MEVACOR) 20 MG tablet Take 10 mg by mouth at bedtime.    . nitroGLYCERIN (NITROSTAT) 0.4 MG SL tablet Place 1 tablet (0.4 mg total) under the tongue every 5 (five) minutes x 3 doses as needed for chest pain. 25 tablet 6  . oxyCODONE-acetaminophen (PERCOCET) 10-325 MG tablet Take 1 tablet by mouth 2 (two) times daily as needed for pain. 30 tablet 0  . spironolactone (ALDACTONE) 25 MG tablet Take 50 mg by mouth daily.      No facility-administered medications prior to visit.     Allergies:   Codeine and Latex   Social History   Social History  . Marital Status: Married    Spouse Name: N/A  . Number of Children: N/A  . Years of Education: N/A   Social History Main Topics  . Smoking status: Former Smoker -- 0.20 packs/day for 40 years    Types: Cigarettes    Quit date: 04/20/2012  . Smokeless tobacco: Never Used     Comment: 6/5//13 "a pack of cigarettes would last me 1 - 1 1/2 wk"  . Alcohol Use: No  . Drug Use: No  . Sexual Activity: Not Currently     Comment: married, 2 children, work with computers   Other Topics Concern  . Not on file   Social History Narrative      Family History:  [name] family history includes COPD in her father; Clotting disorder in her maternal grandmother; Coronary artery disease (age of onset: 64) in her mother; Stroke in her father.   ROS:   Please see the history of present illness.    ROS All other systems reviewed and are negative.   Physical Exam:    VS:  There were no vitals taken for this visit.   GEN: Well nourished, well developed, in no acute distress HEENT: normal Neck: no JVD, no masses Cardiac: Normal S1/S2, RRR; no murmurs, rubs, or gallops, no edema;   carotid bruits,   Respiratory:  clear to auscultation bilaterally; no wheezing, rhonchi or rales GI: soft, nontender, nondistended MS: no deformity  or atrophy Skin: warm and dry Neuro: No focal deficits  Psych: Alert and oriented x 3, normal affect  Wt Readings from Last 3 Encounters:  02/13/16 259 lb 14.8 oz (117.9 kg)  01/23/16 280 lb 14.4 oz (127.415 kg)  01/16/16 291 lb 1.6 oz (132.042 kg)      Studies/Labs Reviewed:     EKG:  EKG is  ordered today.  The ekg ordered today demonstrates   Recent Labs: 02/12/2016: ALT 14; B Natriuretic Peptide 137.3* 02/13/2016: BUN 12; Creatinine, Ser 0.91; Hemoglobin 11.1*; Magnesium 2.3; Platelets 616*; Potassium 3.9; Sodium 137   Recent Lipid Panel    Component Value Date/Time   CHOL 155 02/02/2016 1510   TRIG 137 02/02/2016 1510   HDL 21* 02/02/2016 1510   CHOLHDL 7.4 02/02/2016 1510   VLDL 27 02/02/2016 1510   LDLCALC 107* 02/02/2016 1510    Additional studies/ records that were reviewed today include:    LE Venous Duplex 02/14/16 No DVT bilaterally  TEE 02/05/16 EF 60-65%, normal wall motion, mild plaque and ascending thoracic aorta, mitral valve with mobile vegetation on the posterior leaflet measuring 2 cm, posterior and anterior leaflets did not coapt properly, severe MR, MAC, mild LAE, normal RVEF Impressions:- Mitral valve endocarditis involving posterior leaflet with severe,  anterioly-directed mitral regurgitation.  Echo 01/14/16 Mild concentric LVH, EF 123456, grade 2 diastolic dysfunction, MAC, mild MR, trivial PI   ASSESSMENT:     1. Bacterial endocarditis   2. Mitral regurgitation   3. Chronic diastolic CHF (congestive heart failure) ([place])   4. Cerebrovascular accident (CVA) due to embolism of cerebral artery ([place])   5. Essential hypertension   6. HLD (hyperlipidemia)   7. Type 2 diabetes mellitus without complication, unspecified long term insulin use status (H[facility])     PLAN:     In order of problems listed above:  1. Infective Endocarditis -   2. Mitral Regurgitation - Related to #1.    3. HFpEF - Hx of diastolic CHF and now severe MR.    4. CVA - s/p septic embolic CVA with assoc SAH as well as septic emboli to the spleen x 2.  As noted, hospital stay c/b acute RICA occlusion requiring thrombectomy.  She is rehabilitating at [facility].    5. HTN -   6. HL -   7. Diabetes -     Medication Adjustments/Labs and Tests Ordered: Current medicines are reviewed at length with the patient today.  Concerns regarding medicines are outlined above.  Medication changes, Labs and Tests ordered today are outlined in the Patient Instructions noted below. There are no Patient Instructions on file for this visit. Signed, Richardson Dopp, PA-C  03/06/2016 8:33 AM    [facility] [address], [place], [place]  [address] Phone: [phone]; Fax: [phone]     This encounter was created in error - please disregard.

## 2016-03-06 ENCOUNTER — Encounter: Payer: Self-pay | Admitting: Physician Assistant

## 2016-03-06 DIAGNOSIS — E785 Hyperlipidemia, unspecified: Secondary | ICD-10-CM | POA: Insufficient documentation

## 2016-03-06 DIAGNOSIS — I340 Nonrheumatic mitral (valve) insufficiency: Secondary | ICD-10-CM | POA: Insufficient documentation

## 2016-03-16 ENCOUNTER — Telehealth: Payer: Self-pay | Admitting: *Deleted

## 2016-03-16 NOTE — Telephone Encounter (Signed)
Stop date for IV ampicillin today, 03/16/16, OK to stop?  Pt receiving IV ampicillin for endocarditis.  Pt has HSFU appt. 04/10/16 with Dr Linus Salmons.  Phone call to Dr. Willene Hatchet with this question.  Dr. Linus Salmons gave a telephone order to continue IV Ampicillin until a decision is made to stop the medication.  RN noted that the patient "No Showed" for HSFU appt with [facility] Cardiology office on 03/06/16.  Order from Dr. Linus Salmons to continue IV ampicillin given to [facility] RN to continue Ampicillin.  RN shared that the pt missed her follow-up appointment with cardiology office.  Cardiology office telephone number given so that this appointment could be rescheduled.

## 2016-03-17 NOTE — Telephone Encounter (Signed)
She needs to follow up with cardiology before I decide to stop the ampicillin.  She needs to continue ampicillin until she gets a repeat echo and then follow up with cardiothoracic surgery too.  thanks

## 2016-03-18 ENCOUNTER — Encounter: Payer: Self-pay | Admitting: Physician Assistant

## 2016-03-23 NOTE — Progress Notes (Signed)
Cardiology Office Note:    Date:  03/24/2016   ID:  Margaret [name], DOB Nov 02, 1956, MRN [id number]  PCP:  Jearld Lesch, MD  Cardiologist:  Dr. Tobias Alexander   Electrophysiologist:  n/a  Referring MD: Pricilla Riffle, MD   Chief Complaint  Patient presents with  . Hospitalization Follow-up    MV endocarditis, Mitral Regurg with septic emboli     History of Present Illness:     Margaret [name] is a 60 y.o. female with a hx of COPD, HFpEF (seen by Dr. Katrinka Blazing during hospital admission 7/13), OSA, HTN, HL, poorly controlled diabetes, tobacco abuse.   She was admitted 2/21-2/23 with splenic infarct noted on CT. Venous duplex was negative for DVT. Echocardiogram demonstrated normal LV function and no source for embolus. She was placed on Xarelto for anticoagulation. Outpatient TEE was recommended. She was noted to have cirrhosis on CT scan probably related to fatty liver disease. She followed up with hematology 01/23/16. Notes indicate hyper coagulability panel was negative. Splenic infarct was thought to be possibly related to congestion from cirrhosis with possibility of peripheral arterial disease. There was concern for chronic emboli resulting in pulmonary hypertension as well.   She was then admitted 3/11-3/24 with embolic CVA and associated subarachnoid hemorrhage. She was noted to have strep viridans bacteremia and her presentation was felt to be related to septic emboli. TEE was performed and demonstrated mitral valve endocarditis with associated severe mitral regurgitation.  She was seen by cardiology, cardiothoracic surgery and infectious disease as well as neurology. Hospitalization was complicated by recurrent splenic infarct as well as right brain infarct secondary to acute occlusion of the RCA. She underwent endovascular thrombectomy by IR. It was felt that she would need to undergo rehabilitation with significant recovery before proceeding to mitral valve surgery.  Anticoagulation was not resumed as her infarcts were related to septic emboli. She would need IV ampicillin through 03/16/16. She was discharged to [facility].   The patient is to be followed by Dr. Luciana Axe with ID. Telephone note was recently sent to me regarding whether or not to DC IV ampicillin. It was felt that the patient needed to continue antibiotics until follow-up with cardiology, follow-up echocardiogram and follow-up with cardiothoracic surgery.  Returns for FU. here with her husband.  They both had multiple complaints about her stay at [facility].  I have asked them to bring their complaints to the director.  She has had a lot of constipation.  She has increasing LE edema.  Also notes orthopnea.  Her legs are getting wrapped.  She denies chest pain, syncope.  She feels like anxiety contributes to her dyspnea.  She continues to have significant L sided weakness.  LUE and LLE are essentially flaccid.    Past Medical History  Diagnosis Date  . Hypertension   . Hyperlipidemia   . COPD (chronic obstructive pulmonary disease) (H[facility]) 04/20/12  . Asthma   . Type II diabetes mellitus (HCC)   . Anemia   . H/O hiatal hernia   . Osteoarthritis   . Pneumonia 04/2012  . Migraines   . Panic attacks 04/20/12  . Morbid obesity (HCC)   . Heart murmur   . Bacterial endocarditis     Strep viridans   . Chronic diastolic CHF (congestive heart failure) (HCC) 04/27/2012  . Pulmonary hypertension assoc with unclear multi-factorial mechanisms (HCC) 01/23/2016  . Positive ANA (antinuclear antibody)   . 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 Stroke (HCC)   .  Subarachnoid bleed (HCC) 02/01/2016    Past Surgical History  Procedure Laterality Date  . Laceration repair  1966    "right upper arm"  . Dilation and curettage of uterus  1990's  . Finger reattachment  1973    "right ring finger"  . Tee without cardioversion N/A 02/05/2016    Procedure: TRANSESOPHAGEAL ECHOCARDIOGRAM (TEE);  Surgeon: Laurey Morale, MD;  Location: [facility] ENDOSCOPY;  Service: Cardiovascular;  Laterality: N/A;  . Radiology with anesthesia N/A 02/06/2016    Procedure: RADIOLOGY WITH ANESTHESIA;  Surgeon: Julieanne Cotton, MD;  Location: [facility] OR;  Service: Radiology;  Laterality: N/A;    Current Medications: Outpatient Prescriptions Prior to Visit  Medication Sig Dispense Refill  . albuterol (PROVENTIL HFA;VENTOLIN HFA) 108 (90 BASE) MCG/ACT inhaler Inhale 2 puffs into the lungs every 4 (four) hours as needed. For shortness of breath.    Marland Kitchen albuterol (PROVENTIL) (2.5 MG/3ML) 0.083% nebulizer solution Take 2.5 mg by nebulization at bedtime.     . Alogliptin-Metformin HCl 12.03-999 MG TABS Take 1 tablet by mouth 2 (two) times daily.    Marland Kitchen ampicillin 2 g in sodium chloride 0.9 % 50 mL Inject 2 g into the vein every 4 (four) hours.    . cholecalciferol (VITAMIN D) 1000 units tablet Take 1,000 Units by mouth 2 (two) times daily.    . fluconazole (DIFLUCAN) 100 MG tablet Take 1 tablet (100 mg total) by mouth daily.    . furosemide (LASIX) 40 MG tablet Take 40 mg by mouth 2 (two) times daily.    Marland Kitchen ibuprofen (ADVIL,MOTRIN) 200 MG tablet Take 600 mg by mouth every 6 (six) hours as needed for headache or mild pain.    Marland Kitchen insulin NPH-regular (NOVOLIN 70/30) (70-30) 100 UNIT/ML injection Inject 6-16 Units into the skin 2 (two) times daily with a meal.    . lovastatin (MEVACOR) 20 MG tablet Take 10 mg by mouth at bedtime.    Marland Kitchen oxyCODONE-acetaminophen (PERCOCET) 10-325 MG tablet Take 1 tablet by mouth 2 (two) times daily as needed for pain. 30 tablet 0  . spironolactone (ALDACTONE) 25 MG tablet Take 50 mg by mouth daily.     Marland Kitchen aspirin 81 MG chewable tablet Chew 81 mg by mouth daily. Reported on 03/24/2016    . atenolol (TENORMIN) 50 MG tablet Take 12.5 mg by mouth 2 (two) times daily.     Marland Kitchen loperamide (IMODIUM) 2 MG capsule Take 2 mg by mouth daily as needed for diarrhea or loose stools. Reported on 03/24/2016    . nitroGLYCERIN (NITROSTAT) 0.4  MG SL tablet Place 1 tablet (0.4 mg total) under the tongue every 5 (five) minutes x 3 doses as needed for chest pain. 25 tablet 6   No facility-administered medications prior to visit.      Allergies:   Codeine and Latex   Social History   Social History  . Marital Status: Married    Spouse Name: N/A  . Number of Children: N/A  . Years of Education: N/A   Social History Main Topics  . Smoking status: Former Smoker -- 0.20 packs/day for 40 years    Types: Cigarettes    Quit date: 04/20/2012  . Smokeless tobacco: Never Used     Comment: 6/5//13 "a pack of cigarettes would last me 1 - 1 1/2 wk"  . Alcohol Use: No  . Drug Use: No  . Sexual Activity: Not Currently     Comment: married, 2 children, work with computers   Other  Topics Concern  . None   Social History Narrative     Family History:  [name] family history includes COPD in her father; Clotting disorder in her maternal grandmother; Coronary artery disease (age of onset: 110) in her mother; Stroke in her father.   ROS:   Please see the history of present illness.    ROS All other systems reviewed and are negative.   Physical Exam:    VS:  BP 122/60 mmHg  Pulse 68  Ht 5\' 5"  (1.651 m)  Wt 240 lb (108.863 kg)  BMI 39.94 kg/m2  SpO2 98%   GEN: Well nourished, well developed, in no acute distress HEENT: normal Neck: I cannot assess JVD, no masses Cardiac: Normal S1/S2, RRR; 2/6 holosystolic murmur LLSB/apex, no rubs, or gallops, 1-2+ bilat LE edema;    Respiratory:  Decreased breath sounds with bibasilar crackles, no wheezing GI: soft, nontender, nondistended MS: no deformity or atrophy Skin: warm and dry Neuro: No focal deficits  Psych: Alert and oriented x 3, normal affect  Wt Readings from Last 3 Encounters:  03/24/16 240 lb (108.863 kg)  02/13/16 259 lb 14.8 oz (117.9 kg)  01/23/16 280 lb 14.4 oz (127.415 kg)      Studies/Labs Reviewed:     EKG:  EKG is  ordered today.  The ekg ordered today  demonstrates NSR HR 65, normal axis, QTc 451 ms, no changes  Recent Labs: 02/12/2016: ALT 14; B Natriuretic Peptide 137.3* 02/13/2016: BUN 12; Creatinine, Ser 0.91; Hemoglobin 11.1*; Magnesium 2.3; Platelets 616*; Potassium 3.9; Sodium 137   Recent Lipid Panel    Component Value Date/Time   CHOL 155 02/02/2016 1510   TRIG 137 02/02/2016 1510   HDL 21* 02/02/2016 1510   CHOLHDL 7.4 02/02/2016 1510   VLDL 27 02/02/2016 1510   LDLCALC 107* 02/02/2016 1510    Additional studies/ records that were reviewed today include:   LE Venous Duplex 02/14/16 No DVT bilaterally  TEE 02/05/16 EF 60-65%, normal wall motion, mild plaque and ascending thoracic aorta, mitral valve with mobile vegetation on the posterior leaflet measuring 2 cm, posterior and anterior leaflets did not coapt properly, severe MR, MAC, mild LAE, normal RVEF Impressions:- Mitral valve endocarditis involving posterior leaflet with severe, anterioly-directed mitral regurgitation.  Echo 01/14/16 Mild concentric LVH, EF 60-65%, grade 2 diastolic dysfunction, MAC, mild MR, trivial PI   ASSESSMENT:     1. Bacterial endocarditis   2. Mitral regurgitation   3. Chronic diastolic CHF (congestive heart failure) (HCC)   4. 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 Essential hypertension   6. HLD (hyperlipidemia)     PLAN:     In order of problems listed above:  1. Infective Endocarditis - Continue antibiotics per ID.  Reviewed with Dr. Charlton Haws who was in the office today.  Will arrange FU transthoracic Echo to reassess mitral valve and MR.  Will also get blood cultures. If Echo ok and blood cultures neg, she should be able to stop antibiotics as long as ok with ID.  2. Mitral Regurgitation - Related to #1.Repeat Echo. Will also make sure she has FU with Dr. Cornelius Moras in June.  Not sure when she will be able to have MV surgery as she still needs significant recovery from her CVA.     3. HFpEF  - Hx of diastolic CHF and now severe MR. she is volume overloaded.  I will increase her Lasix to 40 mg bid. BMET  today and repeat in 1 week.  Will also get CXR.  4. CVA - s/p septic embolic CVA with assoc SAH as well as septic emboli to the spleen x 2. As noted, hospital stay c/b acute RICA occlusion requiring thrombectomy. She is rehabilitating at [facility]. she is slow to recover.    5. HTN - BP controlled.   6. HL - Continue statin  Total time spent with patient today 45 minutes. This included review of records, evaluating the patient and coordinating care. Face-to-face time >50%  Medication Adjustments/Labs and Tests Ordered: Current medicines are reviewed at length with the patient today.  Concerns regarding medicines are outlined above.  Medication changes, Labs and Tests ordered today are outlined in the Patient Instructions noted below. Patient Instructions  Medication Instructions:  1. INCREASE LASIX 40 MG TWICE DAILY Labwork: 1. TODAY BMET 2. IN 1 WEEK BMET, BLOOD CULTURES X 2 TO BE DONE AT [facility] WITH THE RESULTS TOBE FAXED TO [place], [facility] [phone] Testing/Procedures: CHEST X-RAY IN 1 WEEK AT [facility] Your physician has requested that you have an echocardiogram. Echocardiography is a painless test that uses sound waves to create images of your heart. It provides your doctor with information about the size and shape of your heart and how well your heart's chambers and valves are working. This procedure takes approximately one hour. There are no restrictions for this procedure. THIS WILL NEED TO BE DONE BEFORE FOLLOW UP APPT  Follow-Up: 1. DR. Delton See IN 2-3 WEEKS OR Masiah Woody, PAC SAME DAY DR. Delton See IS IN THE OFFICE 2. PER Dorean Daniello, PAC YOU WILL NEED TO FOLLOW UP WITH DR. OWEN IN June TO FOLLOW UP ON MITRAL REGURGITATION Any Other Special Instructions Will Be Listed Below (If Applicable). If you need a refill on your cardiac medications before your next  appointment, please call your pharmacy.     Signed, Tereso Newcomer, PA-C  03/24/2016 4:57 PM    [facility] [address] [place], [place], [place]  [address] Phone: [phone]; Fax: [phone]

## 2016-03-24 ENCOUNTER — Encounter: Payer: Self-pay | Admitting: Physician Assistant

## 2016-03-24 ENCOUNTER — Ambulatory Visit (INDEPENDENT_AMBULATORY_CARE_PROVIDER_SITE_OTHER): Payer: Self-pay | Admitting: Physician Assistant

## 2016-03-24 VITALS — BP 122/60 | HR 68 | Ht 65.0 in | Wt 240.0 lb

## 2016-03-24 DIAGNOSIS — I6340 Cerebral infarction due to embolism of unspecified cerebral artery: Secondary | ICD-10-CM

## 2016-03-24 DIAGNOSIS — I330 Acute and subacute infective endocarditis: Secondary | ICD-10-CM

## 2016-03-24 DIAGNOSIS — I5032 Chronic diastolic (congestive) heart failure: Secondary | ICD-10-CM

## 2016-03-24 DIAGNOSIS — E119 Type 2 diabetes mellitus without complications: Secondary | ICD-10-CM

## 2016-03-24 DIAGNOSIS — I10 Essential (primary) hypertension: Secondary | ICD-10-CM

## 2016-03-24 DIAGNOSIS — I340 Nonrheumatic mitral (valve) insufficiency: Secondary | ICD-10-CM

## 2016-03-24 DIAGNOSIS — E785 Hyperlipidemia, unspecified: Secondary | ICD-10-CM

## 2016-03-24 LAB — BASIC METABOLIC PANEL
BUN: 9 mg/dL (ref 7–25)
CHLORIDE: 98 mmol/L (ref 98–110)
CO2: 30 mmol/L (ref 20–31)
CREATININE: 0.77 mg/dL (ref 0.50–1.05)
Calcium: 10.5 mg/dL — ABNORMAL HIGH (ref 8.6–10.4)
GLUCOSE: 98 mg/dL (ref 65–99)
POTASSIUM: 4.7 mmol/L (ref 3.5–5.3)
Sodium: 137 mmol/L (ref 135–146)

## 2016-03-24 MED ORDER — NITROGLYCERIN 0.4 MG SL SUBL: 0.4000 mg | SUBLINGUAL_TABLET | SUBLINGUAL | Status: DC | PRN

## 2016-03-24 NOTE — Patient Instructions (Addendum)
Medication Instructions:  1. INCREASE LASIX 40 MG TWICE DAILY Labwork: 1. TODAY BMET 2. IN 1 WEEK BMET, BLOOD CULTURES X 2 TO BE DONE AT [facility] WITH THE RESULTS TOBE FAXED TO [place], [facility] [phone] Testing/Procedures: CHEST X-RAY IN 1 WEEK AT [facility] Your physician has requested that you have an echocardiogram. Echocardiography is a painless test that uses sound waves to create images of your heart. It provides your doctor with information about the size and shape of your heart and how well your heart's chambers and valves are working. This procedure takes approximately one hour. There are no restrictions for this procedure. THIS WILL NEED TO BE DONE BEFORE FOLLOW UP APPT  Follow-Up: 1. DR. Meda Coffee IN 2-3 WEEKS OR SCOTT WEAVER, PAC SAME DAY DR. Meda Coffee IS IN THE OFFICE 2. PER SCOTT WEAVER, PAC YOU WILL NEED TO FOLLOW UP WITH DR. OWEN IN June TO FOLLOW UP ON MITRAL REGURGITATION Any Other Special Instructions Will Be Listed Below (If Applicable). If you need a refill on your cardiac medications before your next appointment, please call your pharmacy.

## 2016-03-25 ENCOUNTER — Telehealth: Payer: Self-pay | Admitting: *Deleted

## 2016-03-25 ENCOUNTER — Encounter: Payer: Self-pay | Admitting: Neurology

## 2016-03-25 ENCOUNTER — Ambulatory Visit (INDEPENDENT_AMBULATORY_CARE_PROVIDER_SITE_OTHER): Payer: Self-pay | Admitting: Neurology

## 2016-03-25 VITALS — BP 130/70 | HR 80 | Ht 65.0 in

## 2016-03-25 DIAGNOSIS — I63231 Cerebral infarction due to unspecified occlusion or stenosis of right carotid arteries: Secondary | ICD-10-CM

## 2016-03-25 DIAGNOSIS — E785 Hyperlipidemia, unspecified: Secondary | ICD-10-CM

## 2016-03-25 DIAGNOSIS — I330 Acute and subacute infective endocarditis: Secondary | ICD-10-CM

## 2016-03-25 DIAGNOSIS — I609 Nontraumatic subarachnoid hemorrhage, unspecified: Secondary | ICD-10-CM

## 2016-03-25 DIAGNOSIS — D735 Infarction of spleen: Secondary | ICD-10-CM | POA: Insufficient documentation

## 2016-03-25 DIAGNOSIS — I6340 Cerebral infarction due to embolism of unspecified cerebral artery: Secondary | ICD-10-CM

## 2016-03-25 DIAGNOSIS — I10 Essential (primary) hypertension: Secondary | ICD-10-CM

## 2016-03-25 DIAGNOSIS — E1159 Type 2 diabetes mellitus with other circulatory complications: Secondary | ICD-10-CM

## 2016-03-25 NOTE — Telephone Encounter (Signed)
Pt saw PA yesterday which at that time pt gave permission ok to s/w husband. Husband has been notified of lab results. Pt is in [facility]. I advised husband that I will also send results to SNF.

## 2016-03-25 NOTE — Progress Notes (Signed)
STROKE NEUROLOGY FOLLOW UP NOTE  NAME: Margaret [name] DOB: [birth year]  REASON FOR VISIT: stroke follow up HISTORY FROM: pt and husband and chart  Today we had the pleasure of seeing Margaret [name] in follow-up at our Neurology Clinic. Pt was accompanied by husband.   History Summary Margaret [name] is a 60 y.o. female with history of HTN, HLD, DM, COPD, morbid obesity, migraine, anxiety and panic attacks was admitted in 12/2015 for acute splenic infarction at that time LE venous Doppler showed no evidence of DVT. 2-D echo showed EF of 60-65% and no cardiac source of emboli. Patient was started on heparin drip and transitioned to xarelto with plan to obtain outpatient TEE. On 01/23/16 pt was seen by hematology/oncology who felt the splenic infarct most likely was secondary to splenic congestion from liver cirrhosis in combination of peripheral vascular disease. The plan was for her to remain on anticoagulation for next 6 months.   She was re-admitted on 02/01/16 for mild headache, episodic vertigo, visual changes, low-grade fevers, and nausea. WBC 17.9. CT head showed left parieto-occipital small SAH, MRI confirmed the [facility] but also showed at least 4 punctate acute infarcts at watershed area including left MCA/PCA, bilaterally MCA/ACA., embolic pattern. Her Xarelto was discontinued due to [facility]. CTV showed no CVT. EEG and LE DVT negative. TTE EF 60-65%. 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 CTA head and neck showed no mycotic aneurysm or AVM. Blood culture 2/2 positive of strep vividan. ID consulted and she was put on rocephin first and then changed to ampicillin Q4h for 6 weeks. Her LDL 107 and A1C 8.4 with positive ANA and RF.   However, on 02/06/16 pt had sudden onset left facial droop, slurry speech, left hemiplegia and right gaze with left neglect, consistent with right large vessel occlusion. tPA not given due to [facility] and cerebral angio showed right ICA terminus  occlusion and endovascular procedure done with TICI3 revascularization. Post procedure MRI showed scattered punctate and small infarcts involving right MCA cortical areas, right BG, left cerebellar. MRA showed questionable 6 mm new aneurysm at right MCA bifurcation. Recommended repeat CTA but pt refused. Cardiac surgery Dr. Roxy Manns consulted and he thinks pt eventually need MV repair or replacement but need neurological recovery first. She was eventually discharged to SNF with ASA 81mg  and lovastatin 20mg . On discharge, pt still has left hemiplegia.  Interval History During the interval time, the patient has been doing the same. Now in NH and complains that she did not get adequate amount of PT/OT, has finished speech, her antibiotics not given on time. Easily tired, especially after working with PT/OT. Still need O2 at [facility]. Both legs swelling with left one has skin breakdown. Complains of severe anxiety and panic attach intermittently, on Xanax PRN at night but effects short lasting. Stated BP and glucose in good control. Today BP 130/70. However, still has dense left hemiplegia with left facial droop and slurry speech.  REVIEW OF SYSTEMS: Full 14 system review of systems performed and notable only for those listed below and in HPI above, all others are negative:  Constitutional:   Cardiovascular: swelling in legs Ear/Nose/Throat:  Trouble swallowing Skin:  Eyes:  Blurry vision, eye pain Respiratory:  snoring Gastroitestinal:  Diarrhea, consitpation Genitourinary:  Hematology/Lymphatic:   Endocrine: increased thirst Musculoskeletal:  cramps Allergy/Immunology:   Neurological:  Memory loss, confusion, weakness, slurry speech Psychiatric: anxiety, not enough sleep Sleep: sleepiness  The following represents the patient's updated allergies and side  effects list: Allergies  Allergen Reactions  . Codeine Hives, Itching and Other (See Comments)    "breathing problems"  . Latex Itching    The  neurologically relevant items on the patient's problem list were reviewed on today's visit.  Neurologic Examination  A problem focused neurological exam (12 or more points of the single system neurologic examination, vital signs counts as 1 point, cranial nerves count for 8 points) was performed.  Blood pressure 130/70, pulse 80, height 5\' 5"  (1.651 m).  General - Well nourished, well developed, in mild respiratory distress.  Ophthalmologic - Fundi not visualized due to noncooperation.  Cardiovascular - Regular rate and rhythm.  Mental Status -  Level of arousal and orientation to time, place, and person were intact. Language including expression, naming, repetition, comprehension was assessed and found intact, moderate dysarthria. Fund of Knowledge was assessed and was intact.  Cranial Nerves II - XII - II - Visual field intact OU. III, IV, VI - Extraocular movements intact. V - Facial sensation intact bilaterally. VII - left facial droop VIII - Hearing & vestibular intact bilaterally. X - Palate elevates symmetrically. XI - Chin turning & shoulder shrug intact bilaterally. XII - Tongue protrusion intact.  Motor Strength - [name] strength was LUE 1/5 proximal and 0/5 distal, LLE 0/5 proximal and knee extension 3-/5 and distal 1/5, RUE and RLE 5/5.  Bulk was normal and fasciculations were absent.   Motor Tone - Muscle tone was assessed at the neck and appendages and was normal.  Reflexes - [name] reflexes were 1+ in all extremities and she had no pathological reflexes.  Sensory - Light touch, temperature/pinprick were assessed and were normal.    Coordination - The patient had normal movements in the right hand with no ataxia or dysmetria.  Tremor was absent.  Gait and Station - in wheelchair.   Functional score  mRS = 4   0 - No symptoms.   1 - No significant disability. Able to carry out all usual activities, despite some symptoms.   2 - Slight  disability. Able to look after own affairs without assistance, but unable to carry out all previous activities.   3 - Moderate disability. Requires some help, but able to walk unassisted.   4 - Moderately severe disability. Unable to attend to own bodily needs without assistance, and unable to walk unassisted.   5 - Severe disability. Requires constant nursing care and attention, bedridden, incontinent.   6 - Dead.   NIH Stroke Scale   Level Of Consciousness 0=Alert; keenly responsive 1=Not alert, but arousable by minor stimulation 2=Not alert, requires repeated stimulation 3=Responds only with reflex movements 0  LOC Questions to Month and Age 79=Answers both questions correctly 1=Answers one question correctly 2=Answers neither question correctly 0  LOC Commands      -Open/Close eyes     -Open/close grip 0=Performs both tasks correctly 1=Performs one task correctly 2=Performs neighter task correctly 0  Best Gaze 0=Normal 1=Partial gaze palsy 2=Forced deviation, or total gaze paresis 0  Visual 0=No visual loss 1=Partial hemianopia 2=Complete hemianopia 3=Bilateral hemianopia (blind including cortical blindness) 0  Facial Palsy 0=Normal symmetrical movement 1=Minor paralysis (asymmetry) 2=Partial paralysis (lower face) 3=Complete paralysis (upper and lower face) 2  Motor  0=No drift, limb holds posture for full 10 seconds 1=Drift, limb holds posture, no drift to bed 2=Some antigravity effort, cannot maintain posture, drifts to bed 3=No effort against gravity, limb falls 4=No movement Right Arm 0  Leg 0    Left Arm 3     Leg 3  Limb Ataxia 0=Absent 1=Present in one limb 2=Present in two limbs 0  Sensory 0=Normal 1=Mild to moderate sensory loss 2=Severe to total sensory loss 0  Best Language 0=No aphasia, normal 1=Mild to moderate aphasia 2=Mute, global aphasia 3=Mute, global aphasia 0  Dysarthria 0=Normal 1=Mild to moderate 2=Severe, unintelligible or  mute/anarthric 1  Extinction/Neglect 0=No abnormality 1=Extinction to bilateral simultaneous stimulation 2=Profound neglect 0  Total   9     Data reviewed: I personally reviewed the images and agree with the radiology interpretations.  Dg Chest 2 View 02/01/2016  Cardiomegaly and pulmonary venous congestion.   Ct Head Wo Contrast 02/01/2016  Localized subarachnoid hemorrhage/debris in the left parietal occipital region as discussed above. MRI/MRA recommended.   Mri and Mra Head Wo Contrast 02/01/2016  1. Small volume subarachnoid hemorrhage/debris within the left parieto-occipital region, corresponding to abnormality seen on prior CT. Minimal gyral swelling within this region without significant mass effect or other abnormality.  2. Few sub cm cortical/subcortical ischemic infarcts within the bilateral parietal lobes as above.  3. Negative intracranial MRA.   CTA of head and neck 02/01/2016 1. Mild intracranial and extracranial atherosclerosis without significant stenosis. 2. Patent dural venous sinuses. No vascular malformation identified.  LE venous doppler - Bilateral: No evidence of DVT, superficial thrombosis, or Baker's Cyst.  EEG - Unremarkable awake and drowsy routine inpatient EEG. Clinical correlation is recommended .   TEE - Normal LV size with EF 60-65%. Normal wall motion. Normal RV size and systolic function. Trivial TR, no TV vegetation. No PV vegetation. Trileaflet aortic valve with no stenosis or regurgitation, no vegetation. There was a mobile vegetation on the posterior leaflet of the mitral valve measuring about 2 cm in greatest dimension. The posterior and anterior leaflets of the MV did not coapt properly and there was very eccentric, anteriorly-directed mitral regurgitation. I suspect severe MR. Jet was too eccentric for PISA calculations. There was systolic flattening but not flow reversal in the pulmonary vein doppler pattern. The left  atrium was mildly dilated, no LA appendage thrombus. Normal right atrium. No evidence for PFO or ASD. Normal caliber aorta with mild plaque in the descending thoracic aorta.  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  Ct Head Wo Contrast 02/07/2016 IMPRESSION: Acute infarct posterior limb internal capsule Small area of subarachnoid hemorrhage right parietal area stable. No new area of hemorrhage. Electronically Signed By: Franchot Gallo M.D. On: 02/07/2016 18:44   MRI and MRA  02/07/2016 MRI HEAD 1. Patchy multi focal acute right MCA territory infarcts involving the right frontal, parietal, and temporal lobes as above, with involvement of the posterior limb of the right internal capsule. Scattered petechial hemorrhage about a few of these infarcts, with probable small volume subarachnoid hemorrhage within the right parietal lobe, similar relative prior CTs, but new relative to most recent MRI. 2. Probable new subcentimeter cortical infarct within the left parietal lobe, with interval evolution of previously identified punctate subcortical left parietal infarcts. 3. Small subacute appearing infarct within the left cerebellar hemisphere, new relative to most recent MRI from 02/01/2016. 4. Slight interval increase in prominence of subarachnoid hemorrhage within the left parieto-occipital region. While this finding may be related to technique, the possibility of small amount of re- hemorrhage within this region should be considered. Attention at follow-up CTs recommended.  MRA 1. No large or proximal arterial branch occlusion within the intracranial  circulation. 2. Question new 6 mm abnormality at the level of the right MCA bifurcation. Unclear whether this reflects motion artifact in relation to normal branch vessels, small amount of residual or new thrombus, and less likely new aneurysm (although this is not entirely  excluded, with mycotic aneurysm certainly a possibility given the history of endocarditis). Further evaluation with dedicated CTA recommended. (Patient declined CTA.) 3. No other new abnormality within the intracranial circulation.  Cerebral angiogram 02/06/2016 : S/PComplete revascularization of occluded RT ICA terminus and RT MCA prox with x2 passes with Solitaire FR 12mm x 77mm retrieval device and x1 pass with solitaire FR 67mmx 30 mm retrieval device and 5.5 mg of superselective intraarterial integrelin achieving a TICI 3 revascularization.  Component     Latest Ref Rng 01/14/2016 02/02/2016  Cholesterol     0 - 200 mg/dL  155  Triglycerides     <150 mg/dL  137  HDL Cholesterol     >40 mg/dL  21 (L)  Total CHOL/HDL Ratio       7.4  VLDL     0 - 40 mg/dL  27  LDL (calc)     0 - 99 mg/dL  107 (H)  PTT Lupus Anticoagulant     0.0 - 43.6 sec  38.8  DRVVT     0.0 - 44.0 sec  57.5 (H)  Lupus Anticoag Interp       Comment:  Beta-2 Glycoprotein I Ab, IgG     0 - 20 GPI IgG units  <9  Beta-2-Glycoprotein I IgM     0 - 32 GPI IgM units  <9  Beta-2-Glycoprotein I IgA     0 - 25 GPI IgA units  <9  Anticardiolipin Ab,IgG,Qn     0 - 14 GPL U/mL  9  Anticardiolipin Ab,IgM,Qn     0 - 12 MPL U/mL  9  Anticardiolipin Ab,IgA,Qn     0 - 11 APL U/mL  <9  Cytoplasmic (C-ANCA)     Neg:<1:20 titer  <1:20  P-ANCA     Neg:<1:20 titer  <1:20  Atypical P-ANCA titer     Neg:<1:20 titer  <1:20  Recommendations-[id number]:      Comment   Comment      Comment   Hemoglobin A1C     4.8 - 5.6 % 9.1 (H) 8.4 (H)  Mean Plasma Glucose      214 194  Myeloperoxidase Abs     0.0 - 9.0 U/mL  <9.0  ANCA Proteinase 3     0.0 - 3.5 U/mL  <3.5  Speckled Pattern       1:160 (H)  NOTE:       Comment  Protein C-Functional     73 - 180 % 86   Protein C, Total     60 - 150 % 66   Protein S-Functional     63 - 140 % 83   Protein S, Total     60 - 150 % 147   Factor II Activity     50 - 154 % 113     Antithrombin Activity     75 - 120 % 79   Homocysteine     0.0 - 15.0 umol/L  9.3  ds DNA Ab     0 - 9 IU/mL  1  RA Latex Turbid.     0.0 - 13.9 IU/mL  19.8 (H)  ANA Ab, IFA       Positive (A)  SSA (Ro) (  ENA) Antibody, IgG     0.0 - 0.9 AI  <0.2  SSB (La) (ENA) Antibody, IgG     0.0 - 0.9 AI  <0.2  dRVVT Mix     0.0 - 44.0 sec  47.3 (H)  dRVVT Confirm     0.8 - 1.2 ratio  1.2    Assessment: As you may recall, she is a 60 y.o. Caucasian female with PMH of history of HTN, HLD, DM, COPD, morbid obesity, migraine, anxiety and panic attacks was admitted in 12/2015 for acute splenic infarction at that time LE venous Doppler showed no evidence of DVT. 2-D echo showed EF of 60-65% and no cardiac source of emboli. Patient was started on heparin drip and transitioned to xarelto.   She was re-admitted on 02/01/16 and CT head showed left parieto-occipital small SAH, MRI confirmed the [facility] but also showed at least 4 punctate acute infarcts at watershed area including left MCA/PCA, bilaterally MCA/ACA., embolic pattern. Her Xarelto was discontinued due to [facility]. CTV showed no CVT. EEG and LE DVT negative. TTE EF 60-65%. 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 CTA head and neck showed no mycotic aneurysm or AVM. Blood culture 2/2 positive of strep vividan. ID consulted and she was put on rocephin first and then changed to ampicillin Q4h for 6 weeks. Her LDL 107 and A1C 8.4 with positive ANA and RF.   However, on 02/06/16 pt had sudden onset neuro changes consistent with right large vessel occlusion. tPA not given due to [facility] and cerebral angio showed right ICA terminus occlusion and endovascular procedure done with TICI3 revascularization. Post procedure MRI showed scattered punctate and small infarcts involving right MCA cortical areas, right BG, left cerebellar. MRA showed questionable 6 mm new aneurysm at right MCA bifurcation. Recommended repeat CTA but pt refused. Cardiac surgery Dr. Roxy Manns consulted  and he thinks pt eventually need MV repair or replacement but need neurological recovery first. She was eventually discharged to SNF with ASA 81mg  and lovastatin 20mg . Now pt still in NH and still has dense left hemiplegia with left facial droop and slurry speech.  Plan:  - continue ASA 81mg  for stroke prevention - continue antibiotics to treat for endocarditis, it is important to get the medication every 4 hours to achieve effectiveness.  - please continue aggressive PT/OT/speech for stroke rehab - due to anxiety/panic attack, may consider longer acting benzo such as clonazepam low dose, but to avoid respiratory suppression - treat for constipation with combination of stool softener, suppository and enema. - Follow up with your primary care physician for stroke risk factor modification. Recommend maintain blood pressure goal <130/80, diabetes with hemoglobin A1c goal below 6.5% and lipids with LDL cholesterol goal below 70 mg/dL.  - check BP and glucose as scheduled in facility - consider to repeat CTA head to evaluate aneurysm as well as ANA and RF at next visit - follow up in 2 months.  A total of 45 minutes was spent face-to-face with this patient. Over half this time was spent on counseling patient on the stroke diagnosis and stroke follow up management. We reviewed brain images during admission, discussed about aggressive PT/OT and measures for her insomnia, anxiety, panic attack and constipation.      No orders of the defined types were placed in this encounter.    No orders of the defined types were placed in this encounter.    Patient Instructions  - continue ASA 81mg  for stroke prevention - continue antibiotics to treat  for endocarditis, it is important to get the medication every 4 hours to achieve effectiveness.  - please continue aggressive PT/OT/speech for stroke rehab - due to anxiety/panic attack, may consider longer acting benzo such as clonazepam low dose, but to avoid  respiratory suppression - treat for constipation with combination of stool softener, suppository and enema. - Follow up with your primary care physician for stroke risk factor modification. Recommend maintain blood pressure goal <130/80, diabetes with hemoglobin A1c goal below 6.5% and lipids with LDL cholesterol goal below 70 mg/dL.  - check BP and glucose as scheduled in facility - follow up in 2 months.   Rosalin Hawking, MD PhD [facility] [address] [place], [place] [address] [phone]

## 2016-03-25 NOTE — Patient Instructions (Signed)
-   continue ASA 81mg  for stroke prevention - continue antibiotics to treat for endocarditis, it is important to get the medication every 4 hours to achieve effectiveness.  - please continue aggressive PT/OT/speech for stroke rehab - due to anxiety/panic attack, may consider longer acting benzo such as clonazepam low dose, but to avoid respiratory suppression - treat for constipation with combination of stool softener, suppository and enema. - Follow up with your primary care physician for stroke risk factor modification. Recommend maintain blood pressure goal <130/80, diabetes with hemoglobin A1c goal below 6.5% and lipids with LDL cholesterol goal below 70 mg/dL.  - check BP and glucose as scheduled in facility - follow up in 2 months.

## 2016-03-31 ENCOUNTER — Ambulatory Visit (HOSPITAL_COMMUNITY)
Admission: RE | Admit: 2016-03-31 | Discharge: 2016-03-31 | Disposition: A | Discharge status: Home / Self Care | Payer: Medicaid Other | Source: Ambulatory Visit | Attending: Physician Assistant | Admitting: Physician Assistant

## 2016-03-31 DIAGNOSIS — I509 Heart failure, unspecified: Secondary | ICD-10-CM | POA: Diagnosis not present

## 2016-03-31 DIAGNOSIS — I059 Rheumatic mitral valve disease, unspecified: Secondary | ICD-10-CM | POA: Diagnosis present

## 2016-03-31 DIAGNOSIS — E785 Hyperlipidemia, unspecified: Secondary | ICD-10-CM | POA: Diagnosis not present

## 2016-03-31 DIAGNOSIS — I110 Hypertensive heart disease with heart failure: Secondary | ICD-10-CM | POA: Diagnosis not present

## 2016-03-31 DIAGNOSIS — I340 Nonrheumatic mitral (valve) insufficiency: Secondary | ICD-10-CM | POA: Diagnosis not present

## 2016-03-31 DIAGNOSIS — E119 Type 2 diabetes mellitus without complications: Secondary | ICD-10-CM | POA: Diagnosis not present

## 2016-03-31 DIAGNOSIS — I371 Nonrheumatic pulmonary valve insufficiency: Secondary | ICD-10-CM | POA: Insufficient documentation

## 2016-03-31 DIAGNOSIS — I330 Acute and subacute infective endocarditis: Secondary | ICD-10-CM | POA: Diagnosis not present

## 2016-03-31 NOTE — Progress Notes (Signed)
  Echocardiogram 2D Echocardiogram has been performed.  Margaret [name] 03/31/2016, 10:22 AM

## 2016-04-01 ENCOUNTER — Encounter: Payer: Self-pay | Admitting: Physician Assistant

## 2016-04-01 ENCOUNTER — Other Ambulatory Visit: Payer: Self-pay | Admitting: *Deleted

## 2016-04-01 DIAGNOSIS — I059 Rheumatic mitral valve disease, unspecified: Secondary | ICD-10-CM

## 2016-04-03 ENCOUNTER — Telehealth: Payer: Self-pay | Admitting: *Deleted

## 2016-04-03 ENCOUNTER — Telehealth: Payer: Self-pay | Admitting: Physician Assistant

## 2016-04-03 NOTE — Telephone Encounter (Signed)
Follow Up:   She says she still have not received the fax,please send them asap,

## 2016-04-03 NOTE — Telephone Encounter (Signed)
Ok to s/w husband who has been notified of pt's lab results. Mr. Stanback was at SNF when I called. I made pt and husband aware CXR results faxed to Suanne Marker, RN today at [facility].

## 2016-04-03 NOTE — Telephone Encounter (Signed)
I called and s/w Suanne Marker in regards to not receiving the fax on CXR that I sent earlier today. I stated I have a confirmation went through at 15:46 04/03/16. I re-faxed CXR w/orders from [facility] on CXR results. I verified fax # again. [phone].

## 2016-04-06 ENCOUNTER — Telehealth: Payer: Self-pay

## 2016-04-06 NOTE — Telephone Encounter (Signed)
Called and gave Dr. Linus Salmons orders to stop ampicillin medication and to start ceftriaxone 2 gram once daily via PICC for 4 more weeks with a stop date of June 12th due to persistent vegetation found on recent ECHO to nurse Suanne Marker, LPN at [facility]. Nurse Suanne Marker also asked for a hardcopy of physician's order. Faxed orders to [phone]. Rodman Key, LPN

## 2016-04-06 NOTE — Telephone Encounter (Signed)
-----   Message from Thayer Headings, MD sent at 04/06/2016 10:26 AM EDT ----- Recent echo done and persistent vegetation.  She is at [facility] I believe.  We need to extend her antibiotics for at least another 4 weeks while waiting for surgery to see her and decide on a plan (valve replacement or not).  Can you have her change to 2 grams ceftriaxone once daiily via picc (stopping the ampicillin) for 4 more weeks.  She will see CT surgery and then me in the meantime.  thanks

## 2016-04-09 ENCOUNTER — Telehealth: Payer: Self-pay

## 2016-04-09 ENCOUNTER — Encounter: Payer: Self-pay | Admitting: Internal Medicine

## 2016-04-09 ENCOUNTER — Ambulatory Visit (INDEPENDENT_AMBULATORY_CARE_PROVIDER_SITE_OTHER): Payer: Self-pay | Admitting: Internal Medicine

## 2016-04-09 VITALS — BP 107/73 | HR 67 | Temp 97.9°F

## 2016-04-09 DIAGNOSIS — R0902 Hypoxemia: Secondary | ICD-10-CM

## 2016-04-09 DIAGNOSIS — I6340 Cerebral infarction due to embolism of unspecified cerebral artery: Secondary | ICD-10-CM

## 2016-04-09 DIAGNOSIS — I058 Other rheumatic mitral valve diseases: Secondary | ICD-10-CM

## 2016-04-09 DIAGNOSIS — I059 Rheumatic mitral valve disease, unspecified: Secondary | ICD-10-CM

## 2016-04-09 MED ORDER — CEFTRIAXONE SODIUM 2 G IV SOLR: 2.0000 g | INTRAVENOUS | Status: DC

## 2016-04-09 NOTE — Telephone Encounter (Signed)
Received fax of IV administration record from nurse Rhonda,LPN at [facility]. Rodman Key, LPN

## 2016-04-09 NOTE — Assessment & Plan Note (Signed)
On chronic o2.

## 2016-04-09 NOTE — Assessment & Plan Note (Signed)
Unfortunately vegetation is persistent.  I will get antibiotic administration record from rehab to verify the doses given and she is now on ceftriaxone.  I will have this continue for 4 weeks and extend as needed.  I will engage [facility] for transition to home since she is trying to leave [facility] when it can be arranged.

## 2016-04-09 NOTE — Progress Notes (Signed)
   Subjective:    Patient ID: Margaret [name], female    DOB: 05/10/1956, 60 y.o.   MRN: [id number]  HPI Here for follow up of native valve endocarditis.  She is a 60 yo female with Strep viridans bacteremia, pansensitive.  She previously had a splenic infarct in February 0000000 and embolic stroke and then returned in March with persistent fever, malaise, weight loss and found Strep viridans bacteremia.  TEE notable for mobile mitral valve vegetation about 2 cm and severe MR.  She was started on appropriate antibiotics but unfortunately course was complicated acute CVA to right ICA occlusion and endovascular thrombectomy.  She was discharged to [facility] rehab on 3/24 for rehab and IV ampicillin through projected 4/24 for 6 weeks total.     She was initially supposed to have follow up with cardiology on 4/14 for repeat evaluation and echo but the patient did not show from [facility].  I was then called about antibiotic duration and recommended continuing until repeat echo was done.  She was seen by Richardson Dopp of [facility] 5/2 and unfortunately the echo noted a persistent mitral valve vegetation with continued severe MR.  She continues now on ceftriaxone and is scheduled to see Dr. Roxy Manns for further recommendations.     No fever or chills.  She is tearful a bit today because of her dissatisfaction with [facility].      Review of Systems  Constitutional: Negative for fever and fatigue.  Gastrointestinal: Negative for diarrhea.  Skin: Negative for rash.  Neurological: Positive for speech difficulty. Negative for dizziness and light-headedness.       Some improved left arm and leg movement but still not much.         Objective:   Physical Exam  Constitutional: She appears well-developed and well-nourished.  In wheelchair  Eyes: No scleral icterus.  Cardiovascular: Normal rate, regular rhythm and normal heart sounds.   No murmur heard. Pulmonary/Chest: Effort normal and breath  sounds normal. No respiratory distress.  Neurological:  Little movement in left side, slurred speech  Skin: No rash noted.    Social History   Social History  . Marital Status: Married    Spouse Name: N/A  . Number of Children: N/A  . Years of Education: N/A   Occupational History  . Not on file.   Social History Main Topics  . Smoking status: Former Smoker -- 0.20 packs/day for 40 years    Types: Cigarettes    Quit date: 04/20/2012  . Smokeless tobacco: Never Used     Comment: 6/5//13 "a pack of cigarettes would last me 1 - 1 1/2 wk"  . Alcohol Use: No  . Drug Use: No  . Sexual Activity: Not Currently     Comment: married, 2 children, work with computers   Other Topics Concern  . Not on file   Social History Narrative        Assessment & Plan:

## 2016-04-09 NOTE — Telephone Encounter (Signed)
Called [facility] to obtain patient's medication record for IV antibiotics ampicillin and ceftriaxone per Dr. Henreitta Leber verbal order due to medication not being listed on [facility] sent with patient from facility. Spoke with patient's nurse Suanne Marker, LPN that verbally stated patient is on Ceftriaxone 2 grams but that they do not document IV meds on MAR. Explained Dr. Linus Salmons wanted the IV administaration record, Suanne Marker stated that she did not think she could send information without first asking DON. Suanne Marker took this nurse name and number and stated that she would have DON call back. Rodman Key, LPN

## 2016-04-09 NOTE — Assessment & Plan Note (Signed)
Will need to continue rehab.

## 2016-04-10 ENCOUNTER — Inpatient Hospital Stay: Payer: Self-pay | Admitting: Internal Medicine

## 2016-04-13 ENCOUNTER — Encounter: Payer: Self-pay | Admitting: Thoracic Surgery (Cardiothoracic Vascular Surgery)

## 2016-04-13 ENCOUNTER — Ambulatory Visit (INDEPENDENT_AMBULATORY_CARE_PROVIDER_SITE_OTHER): Payer: Self-pay | Admitting: Thoracic Surgery (Cardiothoracic Vascular Surgery)

## 2016-04-13 ENCOUNTER — Encounter: Payer: Self-pay | Admitting: Physician Assistant

## 2016-04-13 ENCOUNTER — Telehealth: Payer: Self-pay | Admitting: *Deleted

## 2016-04-13 VITALS — BP 116/72 | HR 68 | Resp 20 | Ht 65.0 in | Wt 240.0 lb

## 2016-04-13 DIAGNOSIS — I38 Endocarditis, valve unspecified: Secondary | ICD-10-CM | POA: Insufficient documentation

## 2016-04-13 DIAGNOSIS — I058 Other rheumatic mitral valve diseases: Secondary | ICD-10-CM

## 2016-04-13 DIAGNOSIS — I5032 Chronic diastolic (congestive) heart failure: Secondary | ICD-10-CM

## 2016-04-13 DIAGNOSIS — I340 Nonrheumatic mitral (valve) insufficiency: Secondary | ICD-10-CM

## 2016-04-13 DIAGNOSIS — I059 Rheumatic mitral valve disease, unspecified: Secondary | ICD-10-CM

## 2016-04-13 DIAGNOSIS — Z8673 Personal history of transient ischemic attack (TIA), and cerebral infarction without residual deficits: Secondary | ICD-10-CM | POA: Insufficient documentation

## 2016-04-13 NOTE — Telephone Encounter (Signed)
S/w pt's husband DPR ok . Pt resides in [facility] at this time. I called per Nicki Reaper w. PA , Dr. Meda Coffee pt needs appt. Per Dr. Meda Coffee conversation today w/Dr. Roxy Manns to be seen in the next week or so. Per Brynda Rim. PA have pt see him tomorrow 5/23. Husband aware of appt tomorrow 5/23 3 pm. I tried to call [facility] [phone] though person to set up transportation was already gone for the day. Secretary tried to put me into the vm for transport person though this was not successful. I will have to cab first thing in the morning to try and arrange transportation.

## 2016-04-13 NOTE — Progress Notes (Signed)
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  The patient was originally seen in consultation on 02/06/2016 during her complex hospitalization. At that time she suffered a massive stroke. She was eventually discharged to [facility] for rehabilitation where she remains at this time. Since hospital discharge she has been seen in follow-up on several occasions by Richardson Dopp at [facility], Dr. Erlinda Hong at [facility], and Dr. Linus Salmons in the infectious disease clinic.  She recently underwent follow-up transthoracic echocardiogram that revealed normal left ventricular systolic function with severe mitral regurgitation and a highly mobile echo density adherent to the posterior leaflet of the mitral valve consistent with possible residual vegetation. She was referred for cardiothoracic surgical follow-up.  The patient returns to our office with her husband present for follow-up. She continues to struggle with chronic symptoms of congestive heart failure, NYHA functional class IV.  She gets short of breath with minimal activity and occasionally at rest. She cannot sleep at night because she cannot lay flat in bed. She has severe lower extremity edema with chronic transudative drainage from both lower legs related to chronic right-sided congestive heart failure with severe venous insufficiency.  When she gets short of breath she feels tightness across her chest. 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 She has been able to stand up briefly with assistance and she is able to transfer from bed to wheelchair with moderate assistance. Her mental status is completely normal and she seems quite determined to continue to work on her physical rehabilitation. She is severely limited by shortness of breath. She has not had fevers or chills.   Current Outpatient Prescriptions  Medication Sig Dispense Refill  . albuterol (PROVENTIL HFA;VENTOLIN HFA) 108 (90 BASE) MCG/ACT inhaler Inhale 2 puffs into the lungs every 4 (four) hours as needed. For shortness of breath.    Marland Kitchen albuterol (PROVENTIL) (2.5 MG/3ML) 0.083% nebulizer solution Take 2.5 mg by nebulization at bedtime.     . Alogliptin-Metformin HCl 12.03-999 MG TABS Take 1 tablet by mouth 2 (two) times daily.    Marland Kitchen ALPRAZolam (XANAX) 0.5 MG tablet Take 0.5 mg by mouth at bedtime as needed for anxiety. Reported on 04/09/2016    . aspirin 81 MG tablet Take 81 mg by mouth daily.    Marland Kitchen atenolol (TENORMIN) 25 MG tablet Take 25 mg by mouth 2 (two) times daily.    . cefTRIAXone (ROCEPHIN) 2 g SOLR injection Inject 2 g into the vein daily. 10 each 0  . cholecalciferol (VITAMIN D) 1000 units tablet Take 1,000 Units by mouth 2 (two) times daily.    . fluconazole (DIFLUCAN) 100 MG tablet Take 1 tablet (100 mg total) by mouth daily.    . furosemide (LASIX) 40 MG tablet Take 40 mg by mouth 2 (two) times daily.    Marland Kitchen  ibuprofen (ADVIL,MOTRIN) 200 MG tablet Take 600 mg by mouth every 6 (six) hours as needed for headache or mild pain.    Marland Kitchen insulin NPH-regular (NOVOLIN 70/30) (70-30) 100 UNIT/ML injection Inject 6-16 Units into the skin 2 (two) times daily with a meal.    . lovastatin (MEVACOR) 20 MG tablet Take 10 mg by mouth at bedtime.    . nitroGLYCERIN (NITROSTAT) 0.4 MG SL tablet Place 1 tablet (0.4 mg total) under the tongue every  5 (five) minutes x 3 doses as needed for chest pain. 25 tablet 3  . oxyCODONE-acetaminophen (PERCOCET) 10-325 MG tablet Take 1 tablet by mouth 2 (two) times daily as needed for pain. 30 tablet 0  . polyethylene glycol (MIRALAX / GLYCOLAX) packet Take 17 g by mouth daily.    Marland Kitchen senna (SENOKOT) 8.6 MG tablet Take 1 tablet by mouth 2 (two) times daily as needed for constipation.    Marland Kitchen spironolactone (ALDACTONE) 25 MG tablet Take 50 mg by mouth daily. Reported on 04/09/2016     No current facility-administered medications for this visit.      Physical Exam:   BP 116/72 mmHg  Pulse 68  Resp 20  Ht 5\' 5"  (1.651 m)  Wt 240 lb (108.863 kg)  BMI 39.94 kg/m2  SpO2 94%  General:  Morbidly obese and short of breath  Chest:   Diminished breath sounds at both bases with inspiratory rales  CV:   Regular rate and rhythm with prominent systolic murmur  Incisions:  n/a  Abdomen:  Morbidly obese, soft and nontender  Extremities:  Warm and adequately perfused with severe chronic bilateral lower extremity edema, chronic transudate of drainage from both legs with ACE wraps in place.  These are not removed in the office today for full examination of the skin. Similarly, examination of previous skin breakdown in both groins is not performed today.   Diagnostic Tests:  Transthoracic Echocardiography  Patient: Netty, Dubs MR #: [id number] Study Date: 03/31/2016 Gender: F Age: 60 Height: 165.1 cm Weight: 108.9 kg BSA: 2.29 m^2 Pt. Status: Room:  Faylene Million, Nicki Reaper T SONOGRAPHER [facility] PERFORMING [facility], Inpatient  cc:  ------------------------------------------------------------------- LV EF: 60% - 65%  ------------------------------------------------------------------- Indications: MVD [non-rheumatic] 424.0.  ------------------------------------------------------------------- History: PMH: Panic attacks.  Murmur.Bacterial endocarditis. Pulmonary hypertension. Subarachnoid bleed. Congestive heart failure. Stroke. Chronic obstructive pulmonary disease. Risk factors: Hypertension. Diabetes mellitus. Dyslipidemia.  ------------------------------------------------------------------- Study Conclusions  - Left ventricle: The cavity size was normal. There was moderate  concentric hypertrophy. Systolic function was normal. The  estimated ejection fraction was in the range of 60% to 65%. Wall  motion was normal; there were no regional wall motion  abnormalities. Features are consistent with a pseudonormal left  ventricular filling pattern, with concomitant abnormal relaxation  and increased filling pressure (grade 2 diastolic dysfunction).  Doppler parameters are consistent with elevated ventricular  end-diastolic filling pressure. - Aortic valve: Trileaflet; normal thickness leaflets. There was no  regurgitation. - Aortic root: The aortic root was normal in size. - Mitral valve: Moderately thickened, moderately calcified leaflets  . There was severe regurgitation directed centrally and  anteriorly. - Left atrium: The atrium was severely dilated. - Right ventricle: The cavity size was normal. Wall thickness was  normal. Systolic function was normal. - Right atrium: The atrium was mildly dilated. - Pulmonic valve: There was mild regurgitation. - Pulmonary arteries: Systolic pressure was within the normal  range. - Inferior vena cava: The vessel was normal in size.  Impressions:  -  There is a highly mobile echodensity attached to the posterior  mitral valve leaflet measuring 24 mm in its longest diameter.  This is most probably consistent with a vegetation.  When compared to the prior study from 02/11/2016 this might be  slightly increased in size.  Mitral regurgitation is severe.  Normal RVSP.  Transthoracic echocardiography. M-mode, complete 2D,  spectral Doppler, and color Doppler. Birthdate: Patient birthdate: [birth year]. Age: Patient is 60 yr old. Sex: Gender: female. BMI: 40 kg/m^2. Blood pressure: 121/62 Patient status: Outpatient. Study date: Study date: 03/31/2016. Study time: 09:22 AM. Location: Echo laboratory.  -------------------------------------------------------------------  ------------------------------------------------------------------- Left ventricle: The cavity size was normal. There was moderate concentric hypertrophy. Systolic function was normal. The estimated ejection fraction was in the range of 60% to 65%. Wall motion was normal; there were no regional wall motion abnormalities. Features are consistent with a pseudonormal left ventricular filling pattern, with concomitant abnormal relaxation and increased filling pressure (grade 2 diastolic dysfunction). Doppler parameters are consistent with elevated ventricular end-diastolic filling pressure.  ------------------------------------------------------------------- Aortic valve: Trileaflet; normal thickness leaflets. Mobility was not restricted. Doppler: Transvalvular velocity was within the normal range. There was no stenosis. There was no regurgitation.  ------------------------------------------------------------------- Aorta: Aortic root: The aortic root was normal in size.  ------------------------------------------------------------------- Mitral valve: Moderately thickened, moderately calcified leaflets . Mobility was not restricted. Doppler: Transvalvular velocity was within the normal range. There was no evidence for stenosis. There was severe regurgitation directed centrally and anteriorly. Valve area by pressure half-time: 3.28 cm^2. Indexed valve area by pressure half-time: 1.43 cm^2/m^2. Mean gradient (D): 4 mm Hg. Peak gradient (D): 10 mm  Hg.  ------------------------------------------------------------------- Left atrium: The atrium was severely dilated.  ------------------------------------------------------------------- Right ventricle: The cavity size was normal. Wall thickness was normal. Systolic function was normal.  ------------------------------------------------------------------- Pulmonic valve: Structurally normal valve. Cusp separation was normal. Doppler: Transvalvular velocity was within the normal range. There was no evidence for stenosis. There was mild regurgitation.  ------------------------------------------------------------------- Tricuspid valve: Structurally normal valve. Doppler: Transvalvular velocity was within the normal range. There was mild regurgitation.  ------------------------------------------------------------------- Pulmonary artery: The main pulmonary artery was normal-sized. Systolic pressure was within the normal range.  ------------------------------------------------------------------- Right atrium: The atrium was mildly dilated.  ------------------------------------------------------------------- Pericardium: There was no pericardial effusion.  ------------------------------------------------------------------- Systemic veins: Inferior vena cava: The vessel was normal in size.  ------------------------------------------------------------------- Measurements  Left ventricle Value Reference LV ID, ED, PLAX chordal 49.6 mm 43 - 52 LV ID, ES, PLAX chordal 31.7 mm 23 - 38 LV fx shortening, PLAX chordal 36 % >=29 LV PW thickness, ED 13 mm --------- IVS/LV PW ratio, ED 0.98 <=1.3 LV e&', lateral 10.7 cm/s --------- LV E/e&', lateral  14.67 --------- LV e&', medial 5 cm/s --------- LV E/e&', medial 31.4 --------- LV e&', average 7.85 cm/s --------- LV E/e&', average 20 ---------  Ventricular septum Value Reference IVS thickness, ED 12.7 mm ---------  LVOT Value Reference LVOT ID, S 21 mm --------- LVOT area 3.46 cm^2 ---------  Aorta Value Reference Aortic root ID, ED 33 mm ---------  Left atrium Value Reference LA ID, A-P, ES 48 mm --------- LA ID/bsa, A-P 2.1 cm/m^2 <=2.2 LA volume, S 125 ml --------- LA volume/bsa, S 54.6 ml/m^2 --------- LA volume, ES, 1-p A4C 116 ml --------- LA volume/bsa, ES, 1-p A4C 50.7 ml/m^2 --------- LA volume, ES, 1-p A2C 128 ml --------- LA volume/bsa, ES, 1-p A2C 55.9 ml/m^2 ---------  Mitral valve Value Reference Mitral E-wave peak velocity 157 cm/s --------- Mitral A-wave peak velocity 64.6  cm/s --------- Mitral mean velocity, D 89.5 cm/s --------- Mitral deceleration time (H) 268 ms 150 - 230 Mitral pressure half-time 67 ms --------- Mitral mean gradient, D 4 mm Hg --------- Mitral peak gradient, D 10 mm Hg --------- Mitral E/A ratio, peak 2.4  --------- Mitral valve area, PHT, DP 3.28 cm^2 --------- Mitral valve area/bsa, PHT, DP 1.43 cm^2/m^2 --------- Mitral annulus VTI, D 42.3 cm --------- Mitral regurg VTI, PISA 123 cm ---------  Right ventricle Value Reference RV s&', lateral, S 11.4 cm/s ---------  Pulmonic valve Value Reference Pulmonic regurg velocity, ED 257 cm/s --------- Pulmonic regurg gradient, ED 26 mm Hg ---------  Legend: (L) and (H) mark values outside specified reference range.  ------------------------------------------------------------------- Prepared and Electronically Authenticated by  Ena Dawley, M.D. 2017-05-09T12:52:29   Impression:  Patient has stage D severe symptomatic primary mitral regurgitation. 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  Her course was complicated by several small strokes and a small SAH in early March followed by a massive stroke in the right MCA distribution that was treated by emergent evacuation of clot/debris by Interventional Radiology.  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  However, she has made some progress with physical rehabilitation, her mental status is normal, and she remains very determined to continue to work hard at her long term rehab. At present she would not be considered a surgical candidate because of severe decompensated heart failure complicated by her numerous comorbid medical problems including her recent stroke.  However, long-term prognosis without surgical treatment for her mitral  regurgitation and endocarditis would be quite grim.    It is unclear whether or not the patient might improve enough with aggressive treatment of congestive heart failure to the point where it might be reasonable to consider elective surgery. She would clearly be at very high risk for prolonged respiratory failure, and wound care for her groin skin breakdown and both lower legs would need to be aggressively managed to minimize the likelihood of continued infection.  She would need dental service consultation and possible extraction.  If her medical conditions could be optimized to the point where surgical intervention might be feasible, further evaluation by the physical medicine rehabilitation team might be appropriate to ascertain whether she might be a candidate for aggressive inpatient physical rehabilitation following uncomplicated surgery.    Plan:  I have discussed matters at length with the patient and her husband in the office today. I have also discussed the patient's current condition and prognosis at length with Dr. Meda Coffee and Dr. Erlinda Hong over the telephone.  Dr. Erlinda Hong has suggested that from a neurologic standpoint it might be best to delay any plans for surgery until 6 months has passed from the time of her massive stroke. However, the patient's cardiac status is quite tenuous and her rehabilitation appears to be limited primarily due to by her congestive heart failure.  Moreover, she appears at very high risk for the development of acute exacerbation of congestive heart failure and/or recurrent embolization in the near future. Unfortunately, the patient currently looks as though she would do very poorly with surgery.  However, I would be willing to reconsider high risk surgical intervention if she were to demonstrate the capacity for improvement with aggressive medical therapy.  I believe that this would be require  inpatient admission and care.  If this is not possible it might be best to consider  transitioning her care to a long-term palliative approach as her prognosis is very poor.   I spent in excess of 30 minutes during the conduct of this office consultation and >50% of this time involved direct face-to-face encounter with the patient for counseling and/or coordination of their care.   Valentina Gu. Roxy Manns, MD 04/13/2016 2:19 PM

## 2016-04-13 NOTE — Progress Notes (Signed)
Cardiology Office Note:    Date:  04/13/2016   ID:  Margaret [name], DOB 1956-06-09, MRN [id number]  PCP:  Margaret Junior, MD  Cardiologist:  Dr. Ena [name]   Electrophysiologist:  n/a  Referring MD: Margaret Junior, MD   No chief complaint on file.   History of Present Illness:     Margaret [name] is a 60 y.o. female with a hx of COPD, HFpEF (seen by Dr. Tamala [name] during hospital admission 7/13), OSA, HTN, HL, poorly controlled diabetes, tobacco abuse.   Admitted in 2/17 with splenic infarct and was placed on Xarelto for anticoagulation.   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 She was noted to have strep viridans bacteremia and her presentation was felt to be related to septic emboli. 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 Hospitalization was complicated by recurrent splenic infarct as well as right brain infarct secondary to acute occlusion of the RCA. She underwent endovascular thrombectomy by IR. It was felt that she would need to undergo rehabilitation with significant recovery before proceeding to mitral valve surgery.  She was discharged to [facility].   I saw her 03/24/16.  She was volume overloaded.  I adjusted her Lasix.  Repeat Echo demonstrated persistent MV vegetation and severe MR.  I discussed her case with Dr. Roxy [name].  He sees her later today to reevaluate regarding MV surgery.  Dr. Linus [name] with ID has continued her on IV antibiotics.  She returns for FU.     Past Medical History  Diagnosis Date  . Hypertension   . Hyperlipidemia   . COPD (chronic obstructive pulmonary disease) ([place]) 04/20/12  . Asthma   . Type II diabetes mellitus ([place])   . Anemia   . H/O hiatal hernia   . Osteoarthritis   . Pneumonia 04/2012  . Migraines   . Panic attacks 04/20/12  . Morbid obesity ([place])   . Heart murmur   . Bacterial endocarditis     Strep viridans   . Chronic diastolic  CHF (congestive heart failure) ([place]) 04/27/2012  . Pulmonary hypertension assoc with unclear multi-factorial mechanisms ([place]) 01/23/2016  . Positive ANA (antinuclear antibody)   . 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 Stroke ([place])   . Subarachnoid bleed ([place]) 02/01/2016    Past Surgical History  Procedure Laterality Date  . Laceration repair  1966    "right upper arm"  . Dilation and curettage of uterus  1990's  . Finger reattachment  1973    "right ring finger"  . Tee without cardioversion N/A 02/05/2016    Procedure: TRANSESOPHAGEAL ECHOCARDIOGRAM (TEE);  Surgeon: Margaret Dresser, MD;  Location: [place];  Service: Cardiovascular;  Laterality: N/A;  . Radiology with anesthesia N/A 02/06/2016    Procedure: RADIOLOGY WITH ANESTHESIA;  Surgeon: Margaret Bras, MD;  Location: [place];  Service: Radiology;  Laterality: N/A;    Current Medications: Outpatient Prescriptions Prior to Visit  Medication Sig Dispense Refill  . albuterol (PROVENTIL HFA;VENTOLIN HFA) 108 (90 BASE) MCG/ACT inhaler Inhale 2 puffs into the lungs every 4 (four) hours as needed. For shortness of breath.    Margaret [name] albuterol (PROVENTIL) (2.5 MG/3ML) 0.083% nebulizer solution Take 2.5 mg by nebulization at bedtime.     . Alogliptin-Metformin HCl 12.03-999 MG TABS Take 1 tablet by mouth 2 (two) times daily.    Margaret [name] ALPRAZolam (XANAX) 0.5 MG tablet Take 0.5 mg by mouth at bedtime as needed for anxiety. Reported on 04/09/2016    .  aspirin 81 MG tablet Take 81 mg by mouth daily.    Margaret [name] atenolol (TENORMIN) 25 MG tablet Take 25 mg by mouth 2 (two) times daily.    . cefTRIAXone (ROCEPHIN) 2 g SOLR injection Inject 2 g into the vein daily. 10 each 0  . cholecalciferol (VITAMIN D) 1000 units tablet Take 1,000 Units by mouth 2 (two) times daily.    . fluconazole (DIFLUCAN) 100 MG tablet Take 1 tablet (100 mg total) by mouth daily.    . furosemide (LASIX) 40 MG tablet Take 40 mg by mouth 2 (two) times daily.    Margaret [name] ibuprofen (ADVIL,MOTRIN) 200 MG  tablet Take 600 mg by mouth every 6 (six) hours as needed for headache or mild pain.    Margaret [name] insulin NPH-regular (NOVOLIN 70/30) (70-30) 100 UNIT/ML injection Inject 6-16 Units into the skin 2 (two) times daily with a meal.    . lovastatin (MEVACOR) 20 MG tablet Take 10 mg by mouth at bedtime.    . nitroGLYCERIN (NITROSTAT) 0.4 MG SL tablet Place 1 tablet (0.4 mg total) under the tongue every 5 (five) minutes x 3 doses as needed for chest pain. 25 tablet 3  . oxyCODONE-acetaminophen (PERCOCET) 10-325 MG tablet Take 1 tablet by mouth 2 (two) times daily as needed for pain. (Patient not taking: Reported on 04/09/2016) 30 tablet 0  . polyethylene glycol (MIRALAX / GLYCOLAX) packet Take 17 g by mouth daily.    Margaret [name] senna (SENOKOT) 8.6 MG tablet Take 1 tablet by mouth 2 (two) times daily as needed for constipation.    Margaret [name] spironolactone (ALDACTONE) 25 MG tablet Take 50 mg by mouth daily. Reported on 04/09/2016     No facility-administered medications prior to visit.      Allergies:   Codeine and Latex   Social History   Social History  . Marital Status: Married    Spouse Name: N/A  . Number of Children: N/A  . Years of Education: N/A   Social History Main Topics  . Smoking status: Former Smoker -- 0.20 packs/day for 40 years    Types: Cigarettes    Quit date: 04/20/2012  . Smokeless tobacco: Never Used     Comment: 6/5//13 "a pack of cigarettes would last me 1 - 1 1/2 wk"  . Alcohol Use: No  . Drug Use: No  . Sexual Activity: Not Currently     Comment: married, 2 children, work with computers   Other Topics Concern  . Not on file   Social History Narrative     Family History:  [name] family history includes COPD in her father; Clotting disorder in her maternal grandmother; Coronary artery disease (age of onset: 36) in her mother; Stroke in her father.   ROS:   Please see the history of present illness.    ROS All other systems reviewed and are negative.   Physical Exam:    VS:   There were no vitals taken for this visit.   GEN: Well nourished, well developed, in no acute distress HEENT: normal Neck: no JVD, no masses Cardiac: Normal S1/S2, RRR; no murmurs, rubs, or gallops, no edema;   carotid bruits,   Respiratory:  clear to auscultation bilaterally; no wheezing, rhonchi or rales GI: soft, nontender, nondistended MS: no deformity or atrophy Skin: warm and dry Neuro: No focal deficits  Psych: Alert and oriented x 3, normal affect  Wt Readings from Last 3 Encounters:  03/24/16 240 lb (108.863 kg)  02/13/16 259 lb 14.8 oz (  117.9 kg)  01/23/16 280 lb 14.4 oz (127.415 kg)      Studies/Labs Reviewed:     EKG:  EKG is  ordered today.  The ekg ordered today demonstrates   Recent Labs: 02/12/2016: ALT 14; B Natriuretic Peptide 137.3* 02/13/2016: Hemoglobin 11.1*; Magnesium 2.3; Platelets 616* 03/24/2016: BUN 9; Creat 0.77; Potassium 4.7; Sodium 137   Recent Lipid Panel    Component Value Date/Time   CHOL 155 02/02/2016 1510   TRIG 137 02/02/2016 1510   HDL 21* 02/02/2016 1510   CHOLHDL 7.4 02/02/2016 1510   VLDL 27 02/02/2016 1510   LDLCALC 107* 02/02/2016 1510    Additional studies/ records that were reviewed today include:    Echo 03/31/16 Moderate concentric LVH, EF 60-65%, normal wall motion, grade 2 diastolic dysfunction, severe MR, severe LAE, mild RAE, highly mobile echodensity attached to posterior mitral valve leaflet consistent with vegetation measuring 24 mm  LE Venous Duplex 02/14/16 No DVT bilaterally  TEE 02/05/16 EF 60-65%, normal wall motion, mild plaque and ascending thoracic aorta, mitral valve with mobile vegetation on the posterior leaflet measuring 2 cm, posterior and anterior leaflets did not coapt properly, severe MR, MAC, mild LAE, normal RVEF Impressions:- Mitral valve endocarditis involving posterior leaflet with severe, anterioly-directed mitral regurgitation.  Echo 01/14/16 Mild concentric LVH, EF 123456, grade 2 diastolic  dysfunction, MAC, mild MR, trivial PI   ASSESSMENT:     1. Endocarditis of mitral valve   2. Mitral regurgitation   3. Chronic diastolic congestive heart failure due to valvular disease ([place])   4. History of stroke   5. Essential hypertension   6. HLD (hyperlipidemia)     PLAN:     In order of problems listed above:  1. Infective Endocarditis -   2. Mitral Regurgitation - Related to #1.    3. Chronic diastolic CHF in the setting of severe MR -   4. CVA - s/p septic embolic CVA with assoc SAH as well as septic emboli to the spleen x 2. As noted, hospital stay c/b acute RICA occlusion requiring thrombectomy.   5. HTN -   6. HL - Continue statin   Medication Adjustments/Labs and Tests Ordered: Current medicines are reviewed at length with the patient today.  Concerns regarding medicines are outlined above.  Medication changes, Labs and Tests ordered today are outlined in the Patient Instructions noted below. There are no Patient Instructions on file for this visit. Signed, Richardson Dopp, PA-C  04/13/2016 8:27 AM    [facility] [place], [place], [place]  [address] Phone: [phone]; Fax: [phone]     This encounter was created in error - please disregard.

## 2016-04-13 NOTE — Patient Instructions (Signed)
Continue all previous medications without any changes at this time  

## 2016-04-13 NOTE — Progress Notes (Signed)
Cardiology Office Note:    Date:  04/14/2016   ID:  Margaret [name], DOB February 21, 1956, MRN [id number]  PCP:  Harvie Junior, MD  Cardiologist:  Dr. Ena Dawley   Electrophysiologist:  n/a  Referring MD: Harvie Junior, MD   Chief Complaint  Patient presents with  . Congestive Heart Failure    History of Present Illness:     Margaret [name] is a 60 y.o. female with a hx of COPD, HFpEF (seen by Dr. Tamala Julian during hospital admission 7/13), OSA, HTN, HL, poorly controlled diabetes, tobacco abuse.   Admitted in 2/17 with splenic infarct and was placed on Xarelto for anticoagulation.   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 She was noted to have strep viridans bacteremia and her presentation was felt to be related to septic emboli. 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 She underwent endovascular thrombectomy by IR. It was felt that she would need to undergo rehabilitation with significant recovery before proceeding to mitral valve surgery. She was discharged to [facility] SNF.   I saw her 03/24/16. She was volume overloaded. I adjusted her Lasix. Repeat Echo demonstrated persistent MV vegetation and severe MR. I discussed her case with Dr. Roxy Manns.  Dr. Linus Salmons with ID has continued her on IV antibiotics.  She missed her appointment with me yesterday. She arrived late from SNF and the [place] lift system was broken (she could not get off the [place]).  She did see Dr. Roxy Manns.  He reviewed her case with Dr. Erlinda Hong and Dr. Meda Coffee.  Dr. Erlinda Hong felt like the patient should hold off on surgery for her MV for 6 mos (from a neuro standpoint).  Dr. Roxy Manns felt that her cardiac status was tenuous and that she was at risk for recurrent embolization.  It was felt she would do poorly with  surgery.  She was added back on to my schedule today for evaluation with an eye towards admission for IV diuresis.    She returns for FU. Here with her husband.  She remains at [facility]. She is obviously short of breath at rest. She has to sleep sitting up.  She notes + PND.  LE edema is worse.  Denies any bleeding, fever or significant cough.     Past Medical History  Diagnosis Date  . Hypertension   . Hyperlipidemia   . COPD (chronic obstructive pulmonary disease) ([place]) 04/20/12  . Asthma   . Type II diabetes mellitus ([place])   . Anemia   . H/O hiatal hernia   . Osteoarthritis   . Pneumonia 04/2012  . Migraines   . Panic attacks 04/20/12  . Morbid obesity ([place])   . Heart murmur   . Bacterial endocarditis     Strep viridans   . Chronic diastolic CHF (congestive heart failure) ([place]) 04/27/2012  . Pulmonary hypertension assoc with unclear multi-factorial mechanisms ([place]) 01/23/2016  . Positive ANA (antinuclear antibody)   . 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 Stroke ([place])   . Subarachnoid bleed ([place]) 02/01/2016    Past Surgical History  Procedure Laterality Date  . Laceration repair  1966    "right upper arm"  . Dilation and curettage of uterus  1990's  . Finger reattachment  1973    "right ring finger"  . Tee without cardioversion N/A 02/05/2016    Procedure: TRANSESOPHAGEAL ECHOCARDIOGRAM (  TEE);  Surgeon: Larey Dresser, MD;  Location: [place];  Service: Cardiovascular;  Laterality: N/A;  . Radiology with anesthesia N/A 02/06/2016    Procedure: RADIOLOGY WITH ANESTHESIA;  Surgeon: Luanne Bras, MD;  Location: [place];  Service: Radiology;  Laterality: N/A;    Current Medications: Outpatient Prescriptions Prior to Visit  Medication Sig Dispense Refill  . albuterol (PROVENTIL HFA;VENTOLIN HFA) 108 (90 BASE) MCG/ACT inhaler Inhale 2 puffs into the lungs every 4 (four) hours as needed. For shortness of breath.    Marland Kitchen albuterol (PROVENTIL) (2.5 MG/3ML) 0.083% nebulizer solution  Take 2.5 mg by nebulization at bedtime.     . Alogliptin-Metformin HCl 12.03-999 MG TABS Take 1 tablet by mouth 2 (two) times daily.    Marland Kitchen ALPRAZolam (XANAX) 0.5 MG tablet Take 0.5 mg by mouth at bedtime as needed for anxiety. Reported on 04/09/2016    . aspirin 81 MG tablet Take 81 mg by mouth daily.    Marland Kitchen atenolol (TENORMIN) 25 MG tablet Take 25 mg by mouth 2 (two) times daily.    . cefTRIAXone (ROCEPHIN) 2 g SOLR injection Inject 2 g into the vein daily. 10 each 0  . cholecalciferol (VITAMIN D) 1000 units tablet Take 1,000 Units by mouth 2 (two) times daily.    . fluconazole (DIFLUCAN) 100 MG tablet Take 1 tablet (100 mg total) by mouth daily.    . furosemide (LASIX) 40 MG tablet Take 40 mg by mouth 2 (two) times daily.    Marland Kitchen ibuprofen (ADVIL,MOTRIN) 200 MG tablet Take 600 mg by mouth every 6 (six) hours as needed for headache or mild pain.    Marland Kitchen insulin NPH-regular (NOVOLIN 70/30) (70-30) 100 UNIT/ML injection Inject 6-16 Units into the skin 2 (two) times daily with a meal.    . lovastatin (MEVACOR) 20 MG tablet Take 10 mg by mouth at bedtime.    . nitroGLYCERIN (NITROSTAT) 0.4 MG SL tablet Place 1 tablet (0.4 mg total) under the tongue every 5 (five) minutes x 3 doses as needed for chest pain. 25 tablet 3  . polyethylene glycol (MIRALAX / GLYCOLAX) packet Take 17 g by mouth daily.    Marland Kitchen senna (SENOKOT) 8.6 MG tablet Take 1 tablet by mouth 2 (two) times daily as needed for constipation.    Marland Kitchen spironolactone (ALDACTONE) 25 MG tablet Take 50 mg by mouth daily. Reported on 04/09/2016    . oxyCODONE-acetaminophen (PERCOCET) 10-325 MG tablet Take 1 tablet by mouth 2 (two) times daily as needed for pain. (Patient not taking: Reported on 04/14/2016) 30 tablet 0   No facility-administered medications prior to visit.      Allergies:   Codeine and Latex   Social History   Social History  . Marital Status: Married    Spouse Name: N/A  . Number of Children: N/A  . Years of Education: N/A   Social  History Main Topics  . Smoking status: Former Smoker -- 0.20 packs/day for 40 years    Types: Cigarettes    Quit date: 04/20/2012  . Smokeless tobacco: Never Used     Comment: 6/5//13 "a pack of cigarettes would last me 1 - 1 1/2 wk"  . Alcohol Use: No  . Drug Use: No  . Sexual Activity: Not Currently     Comment: married, 2 children, work with computers   Other Topics Concern  . None   Social History Narrative     Family History:  [name] family history includes COPD in her father; Clotting  disorder in her maternal grandmother; Coronary artery disease (age of onset: 56) in her mother; Stroke in her father.   ROS:   Please see the history of present illness.    Review of Systems  Constitution: Positive for malaise/fatigue. Negative for fever.  Gastrointestinal: Negative for hematochezia and melena.   All other systems reviewed and are negative.   Physical Exam:    VS:  BP 116/60 mmHg  Pulse 70  Ht 5\' 5"  (1.651 m)  Wt 260 lb (117.935 kg)  BMI 43.27 kg/m2  SpO2 92%   GEN: Well nourished, well developed, in no acute distress HEENT: normal Neck: I cannot assess JVD, no masses Cardiac: Normal S1/S2, RRR; I cannot appreciate a murmur today, massive bilateral LE edema   Respiratory:  Decreased breath sounds with bilateral crackles 1/2 up chest GI:  distended MS: no deformity or atrophy Skin: warm and dry Neuro: L hemiparesis noted  Psych: Alert and oriented x 3, normal affect  Wt Readings from Last 3 Encounters:  04/14/16 260 lb (117.935 kg)  04/13/16 240 lb (108.863 kg)  03/24/16 240 lb (108.863 kg)      Studies/Labs Reviewed:     EKG:  EKG is not ordered today.  The ekg ordered today demonstrates n/a  Recent Labs: 02/12/2016: ALT 14; B Natriuretic Peptide 137.3* 02/13/2016: Hemoglobin 11.1*; Magnesium 2.3; Platelets 616* 03/24/2016: BUN 9; Creat 0.77; Potassium 4.7; Sodium 137   Recent Lipid Panel    Component Value Date/Time   CHOL 155 02/02/2016 1510    TRIG 137 02/02/2016 1510   HDL 21* 02/02/2016 1510   CHOLHDL 7.4 02/02/2016 1510   VLDL 27 02/02/2016 1510   LDLCALC 107* 02/02/2016 1510    Additional studies/ records that were reviewed today include:   Echo 03/31/16 Moderate concentric LVH, EF 60-65%, normal wall motion, grade 2 diastolic dysfunction, severe MR, severe LAE, mild RAE, highly mobile echodensity attached to posterior mitral valve leaflet consistent with vegetation measuring 24 mm  LE Venous Duplex 02/14/16 No DVT bilaterally  TEE 02/05/16 EF 60-65%, normal wall motion, mild plaque and ascending thoracic aorta, mitral valve with mobile vegetation on the posterior leaflet measuring 2 cm, posterior and anterior leaflets did not coapt properly, severe MR, MAC, mild LAE, normal RVEF Impressions:- Mitral valve endocarditis involving posterior leaflet with severe, anterioly-directed mitral regurgitation.  Echo 01/14/16 Mild concentric LVH, EF 123456, grade 2 diastolic dysfunction, MAC, mild MR, trivial PI   ASSESSMENT:     1. Acute on chronic diastolic congestive heart failure due to valvular disease ([place])   2. Endocarditis of mitral valve   3. Mitral regurgitation   4. History of CVA (cerebrovascular accident)   5. Essential hypertension   6. HLD (hyperlipidemia)     PLAN:     In order of problems listed above:  1. CHF - She is massively volume overloaded (anasarca).  She needs inpatient management for HF.  She would likely do better with inpatient IV diuresis and evaluation by the HF Team.  She will need to be allowed to sit up (she cannot lay flat at all). She will likely need inpatient rehab after recovery from acute HF to help with her recovery from her CVA.    -  Lasix 60 mg IV bid  -  Keep close eye on renal function and K+  -  D/w with out inpatient team >> consult HF Team tomorrow.   2. Infective Endocarditis - Recent echo with highly mobile echodensity attached to posterior MV  leaflet measuring 24 mm. In some  views it appeared to possibly be larger.  She remains on IV antibiotics.  Will likely need ID to follow her as well (depending on longevity of admission).  3. Mitral Regurgitation - Related to #1.Will need to involve Dr. Roxy Manns again at some point if HF cannot be stabilized with medical therapy.    4. CVA - s/p septic embolic CVA with assoc SAH as well as septic emboli to the spleen x 2. As noted, previous hospital stay c/b acute RICA occlusion requiring thrombectomy. She continues to have L sided hemiparesis.  Neuro has recommended 6 mos of recovery prior to proceeding with MV surgery. She remains quite weak and may need inpatient rehab once stable from a HF standpoint to help with recovery from CVA.  5. HTN - Continue current regimen.   6. HL - Continue statin  7. Diabetes - Continue current regimen. Cover with sliding scale.     Medication Adjustments/Labs and Tests Ordered: Current medicines are reviewed at length with the patient today.  Concerns regarding medicines are outlined above.  Medication changes, Labs and Tests ordered today are outlined in the Patient Instructions noted below. Patient Instructions  YOU ARE BEING ADMITTED TO [place] [facility].   Signed, Richardson Dopp, PA-C  04/14/2016 5:12 PM    [facility] [place], [place], [place]  [address] Phone: [phone]; Fax: [phone]

## 2016-04-14 ENCOUNTER — Ambulatory Visit (INDEPENDENT_AMBULATORY_CARE_PROVIDER_SITE_OTHER): Payer: Self-pay | Admitting: Physician Assistant

## 2016-04-14 ENCOUNTER — Encounter: Payer: Self-pay | Admitting: Physician Assistant

## 2016-04-14 ENCOUNTER — Inpatient Hospital Stay (HOSPITAL_COMMUNITY): Payer: Medicaid Other

## 2016-04-14 ENCOUNTER — Encounter (HOSPITAL_COMMUNITY): Payer: Self-pay | Admitting: General Practice

## 2016-04-14 ENCOUNTER — Inpatient Hospital Stay (HOSPITAL_COMMUNITY)
Admission: AD | Admit: 2016-04-14 | Discharge: 2016-05-12 | DRG: 216 | Disposition: A | Discharge status: Rehabilitation Facility | Payer: Medicaid Other | Source: Ambulatory Visit | Attending: Thoracic Surgery (Cardiothoracic Vascular Surgery) | Admitting: Thoracic Surgery (Cardiothoracic Vascular Surgery)

## 2016-04-14 VITALS — BP 116/60 | HR 70 | Ht 65.0 in | Wt 260.0 lb

## 2016-04-14 DIAGNOSIS — E876 Hypokalemia: Secondary | ICD-10-CM | POA: Diagnosis not present

## 2016-04-14 DIAGNOSIS — K7460 Unspecified cirrhosis of liver: Secondary | ICD-10-CM | POA: Diagnosis present

## 2016-04-14 DIAGNOSIS — Z794 Long term (current) use of insulin: Secondary | ICD-10-CM | POA: Diagnosis not present

## 2016-04-14 DIAGNOSIS — I110 Hypertensive heart disease with heart failure: Secondary | ICD-10-CM | POA: Diagnosis present

## 2016-04-14 DIAGNOSIS — R609 Edema, unspecified: Secondary | ICD-10-CM | POA: Diagnosis not present

## 2016-04-14 DIAGNOSIS — J449 Chronic obstructive pulmonary disease, unspecified: Secondary | ICD-10-CM | POA: Diagnosis present

## 2016-04-14 DIAGNOSIS — I272 Other secondary pulmonary hypertension: Secondary | ICD-10-CM | POA: Diagnosis present

## 2016-04-14 DIAGNOSIS — R21 Rash and other nonspecific skin eruption: Secondary | ICD-10-CM | POA: Diagnosis not present

## 2016-04-14 DIAGNOSIS — Z6841 Body Mass Index (BMI) 40.0 and over, adult: Secondary | ICD-10-CM | POA: Diagnosis not present

## 2016-04-14 DIAGNOSIS — E8770 Fluid overload, unspecified: Secondary | ICD-10-CM | POA: Diagnosis not present

## 2016-04-14 DIAGNOSIS — E1151 Type 2 diabetes mellitus with diabetic peripheral angiopathy without gangrene: Secondary | ICD-10-CM | POA: Diagnosis present

## 2016-04-14 DIAGNOSIS — M791 Myalgia: Secondary | ICD-10-CM | POA: Diagnosis present

## 2016-04-14 DIAGNOSIS — Z825 Family history of asthma and other chronic lower respiratory diseases: Secondary | ICD-10-CM

## 2016-04-14 DIAGNOSIS — F0631 Mood disorder due to known physiological condition with depressive features: Secondary | ICD-10-CM | POA: Diagnosis present

## 2016-04-14 DIAGNOSIS — K5900 Constipation, unspecified: Secondary | ICD-10-CM | POA: Diagnosis not present

## 2016-04-14 DIAGNOSIS — Z7982 Long term (current) use of aspirin: Secondary | ICD-10-CM | POA: Diagnosis not present

## 2016-04-14 DIAGNOSIS — F05 Delirium due to known physiological condition: Secondary | ICD-10-CM | POA: Diagnosis not present

## 2016-04-14 DIAGNOSIS — I69322 Dysarthria following cerebral infarction: Secondary | ICD-10-CM | POA: Diagnosis not present

## 2016-04-14 DIAGNOSIS — Z7401 Bed confinement status: Secondary | ICD-10-CM

## 2016-04-14 DIAGNOSIS — Z87891 Personal history of nicotine dependence: Secondary | ICD-10-CM | POA: Diagnosis not present

## 2016-04-14 DIAGNOSIS — J9811 Atelectasis: Secondary | ICD-10-CM

## 2016-04-14 DIAGNOSIS — I69354 Hemiplegia and hemiparesis following cerebral infarction affecting left non-dominant side: Secondary | ICD-10-CM | POA: Diagnosis not present

## 2016-04-14 DIAGNOSIS — I38 Endocarditis, valve unspecified: Secondary | ICD-10-CM | POA: Diagnosis not present

## 2016-04-14 DIAGNOSIS — I69391 Dysphagia following cerebral infarction: Secondary | ICD-10-CM

## 2016-04-14 DIAGNOSIS — Z8673 Personal history of transient ischemic attack (TIA), and cerebral infarction without residual deficits: Secondary | ICD-10-CM

## 2016-04-14 DIAGNOSIS — I878 Other specified disorders of veins: Secondary | ICD-10-CM | POA: Diagnosis present

## 2016-04-14 DIAGNOSIS — Z7901 Long term (current) use of anticoagulants: Secondary | ICD-10-CM | POA: Diagnosis not present

## 2016-04-14 DIAGNOSIS — I480 Paroxysmal atrial fibrillation: Secondary | ICD-10-CM | POA: Diagnosis not present

## 2016-04-14 DIAGNOSIS — R001 Bradycardia, unspecified: Secondary | ICD-10-CM | POA: Diagnosis present

## 2016-04-14 DIAGNOSIS — I63231 Cerebral infarction due to unspecified occlusion or stenosis of right carotid arteries: Secondary | ICD-10-CM | POA: Diagnosis not present

## 2016-04-14 DIAGNOSIS — R6510 Systemic inflammatory response syndrome (SIRS) of non-infectious origin without acute organ dysfunction: Secondary | ICD-10-CM | POA: Insufficient documentation

## 2016-04-14 DIAGNOSIS — G4700 Insomnia, unspecified: Secondary | ICD-10-CM | POA: Diagnosis present

## 2016-04-14 DIAGNOSIS — I69359 Hemiplegia and hemiparesis following cerebral infarction affecting unspecified side: Secondary | ICD-10-CM | POA: Diagnosis not present

## 2016-04-14 DIAGNOSIS — E662 Morbid (severe) obesity with alveolar hypoventilation: Secondary | ICD-10-CM | POA: Diagnosis present

## 2016-04-14 DIAGNOSIS — I69922 Dysarthria following unspecified cerebrovascular disease: Secondary | ICD-10-CM | POA: Diagnosis not present

## 2016-04-14 DIAGNOSIS — R5381 Other malaise: Secondary | ICD-10-CM | POA: Diagnosis present

## 2016-04-14 DIAGNOSIS — I340 Nonrheumatic mitral (valve) insufficiency: Secondary | ICD-10-CM | POA: Diagnosis present

## 2016-04-14 DIAGNOSIS — I509 Heart failure, unspecified: Secondary | ICD-10-CM

## 2016-04-14 DIAGNOSIS — D7589 Other specified diseases of blood and blood-forming organs: Secondary | ICD-10-CM | POA: Diagnosis present

## 2016-04-14 DIAGNOSIS — D473 Essential (hemorrhagic) thrombocythemia: Secondary | ICD-10-CM | POA: Insufficient documentation

## 2016-04-14 DIAGNOSIS — I059 Rheumatic mitral valve disease, unspecified: Secondary | ICD-10-CM | POA: Diagnosis present

## 2016-04-14 DIAGNOSIS — Z8679 Personal history of other diseases of the circulatory system: Secondary | ICD-10-CM

## 2016-04-14 DIAGNOSIS — Z95828 Presence of other vascular implants and grafts: Secondary | ICD-10-CM

## 2016-04-14 DIAGNOSIS — B948 Sequelae of other specified infectious and parasitic diseases: Secondary | ICD-10-CM

## 2016-04-14 DIAGNOSIS — I5033 Acute on chronic diastolic (congestive) heart failure: Secondary | ICD-10-CM | POA: Diagnosis present

## 2016-04-14 DIAGNOSIS — I959 Hypotension, unspecified: Secondary | ICD-10-CM | POA: Diagnosis present

## 2016-04-14 DIAGNOSIS — G8194 Hemiplegia, unspecified affecting left nondominant side: Secondary | ICD-10-CM | POA: Diagnosis not present

## 2016-04-14 DIAGNOSIS — J42 Unspecified chronic bronchitis: Secondary | ICD-10-CM | POA: Diagnosis not present

## 2016-04-14 DIAGNOSIS — R0602 Shortness of breath: Secondary | ICD-10-CM

## 2016-04-14 DIAGNOSIS — I10 Essential (primary) hypertension: Secondary | ICD-10-CM

## 2016-04-14 DIAGNOSIS — R0682 Tachypnea, not elsewhere classified: Secondary | ICD-10-CM | POA: Insufficient documentation

## 2016-04-14 DIAGNOSIS — E1142 Type 2 diabetes mellitus with diabetic polyneuropathy: Secondary | ICD-10-CM | POA: Diagnosis present

## 2016-04-14 DIAGNOSIS — R32 Unspecified urinary incontinence: Secondary | ICD-10-CM | POA: Diagnosis present

## 2016-04-14 DIAGNOSIS — E785 Hyperlipidemia, unspecified: Secondary | ICD-10-CM | POA: Diagnosis present

## 2016-04-14 DIAGNOSIS — K0530 Chronic periodontitis, unspecified: Secondary | ICD-10-CM | POA: Diagnosis present

## 2016-04-14 DIAGNOSIS — J189 Pneumonia, unspecified organism: Secondary | ICD-10-CM | POA: Diagnosis present

## 2016-04-14 DIAGNOSIS — I058 Other rheumatic mitral valve diseases: Secondary | ICD-10-CM

## 2016-04-14 DIAGNOSIS — F3289 Other specified depressive episodes: Secondary | ICD-10-CM | POA: Diagnosis present

## 2016-04-14 DIAGNOSIS — I6930 Unspecified sequelae of cerebral infarction: Secondary | ICD-10-CM | POA: Insufficient documentation

## 2016-04-14 DIAGNOSIS — F410 Panic disorder [episodic paroxysmal anxiety] without agoraphobia: Secondary | ICD-10-CM | POA: Diagnosis present

## 2016-04-14 DIAGNOSIS — E1159 Type 2 diabetes mellitus with other circulatory complications: Secondary | ICD-10-CM | POA: Diagnosis present

## 2016-04-14 DIAGNOSIS — Z9889 Other specified postprocedural states: Secondary | ICD-10-CM

## 2016-04-14 DIAGNOSIS — D62 Acute posthemorrhagic anemia: Secondary | ICD-10-CM | POA: Diagnosis not present

## 2016-04-14 DIAGNOSIS — I669 Occlusion and stenosis of unspecified cerebral artery: Secondary | ICD-10-CM

## 2016-04-14 DIAGNOSIS — D72829 Elevated white blood cell count, unspecified: Secondary | ICD-10-CM | POA: Insufficient documentation

## 2016-04-14 DIAGNOSIS — G8104 Flaccid hemiplegia affecting left nondominant side: Secondary | ICD-10-CM | POA: Diagnosis not present

## 2016-04-14 DIAGNOSIS — R1310 Dysphagia, unspecified: Secondary | ICD-10-CM | POA: Diagnosis present

## 2016-04-14 DIAGNOSIS — D75839 Thrombocytosis, unspecified: Secondary | ICD-10-CM | POA: Insufficient documentation

## 2016-04-14 DIAGNOSIS — K089 Disorder of teeth and supporting structures, unspecified: Secondary | ICD-10-CM

## 2016-04-14 DIAGNOSIS — I76 Septic arterial embolism: Secondary | ICD-10-CM

## 2016-04-14 HISTORY — DX: Other specified postprocedural states: Z98.890

## 2016-04-14 LAB — CBC WITH DIFFERENTIAL/PLATELET
BASOS PCT: 0 %
Basophils Absolute: 0.1 10*3/uL (ref 0.0–0.1)
EOS ABS: 0.7 10*3/uL (ref 0.0–0.7)
EOS PCT: 5 %
HCT: 39 % (ref 36.0–46.0)
Hemoglobin: 11.4 g/dL — ABNORMAL LOW (ref 12.0–15.0)
Lymphocytes Relative: 11 %
Lymphs Abs: 1.5 10*3/uL (ref 0.7–4.0)
MCH: 26.1 pg (ref 26.0–34.0)
MCHC: 29.2 g/dL — AB (ref 30.0–36.0)
MCV: 89.4 fL (ref 78.0–100.0)
MONO ABS: 0.8 10*3/uL (ref 0.1–1.0)
MONOS PCT: 6 %
Neutro Abs: 10.4 10*3/uL — ABNORMAL HIGH (ref 1.7–7.7)
Neutrophils Relative %: 78 %
Platelets: 596 10*3/uL — ABNORMAL HIGH (ref 150–400)
RBC: 4.36 MIL/uL (ref 3.87–5.11)
RDW: 16.1 % — AB (ref 11.5–15.5)
WBC: 13.4 10*3/uL — ABNORMAL HIGH (ref 4.0–10.5)

## 2016-04-14 LAB — COMPREHENSIVE METABOLIC PANEL
ALT: 14 U/L (ref 14–54)
AST: 20 U/L (ref 15–41)
Albumin: 3 g/dL — ABNORMAL LOW (ref 3.5–5.0)
Alkaline Phosphatase: 75 U/L (ref 38–126)
Anion gap: 9 (ref 5–15)
BUN: 8 mg/dL (ref 6–20)
CALCIUM: 10.7 mg/dL — AB (ref 8.9–10.3)
CHLORIDE: 98 mmol/L — AB (ref 101–111)
CO2: 31 mmol/L (ref 22–32)
CREATININE: 0.82 mg/dL (ref 0.44–1.00)
Glucose, Bld: 122 mg/dL — ABNORMAL HIGH (ref 65–99)
Potassium: 4.3 mmol/L (ref 3.5–5.1)
Sodium: 138 mmol/L (ref 135–145)
TOTAL PROTEIN: 7.3 g/dL (ref 6.5–8.1)
Total Bilirubin: 0.3 mg/dL (ref 0.3–1.2)

## 2016-04-14 LAB — GLUCOSE, CAPILLARY
Glucose-Capillary: 128 mg/dL — ABNORMAL HIGH (ref 65–99)
Glucose-Capillary: 85 mg/dL (ref 65–99)

## 2016-04-14 LAB — MRSA PCR SCREENING: MRSA by PCR: NEGATIVE

## 2016-04-14 LAB — PROTIME-INR
INR: 1.12 (ref 0.00–1.49)
PROTHROMBIN TIME: 14.6 s (ref 11.6–15.2)

## 2016-04-14 LAB — APTT: APTT: 30 s (ref 24–37)

## 2016-04-14 MED ORDER — FUROSEMIDE 10 MG/ML IJ SOLN
60.0000 mg | Freq: Two times a day (BID) | INTRAMUSCULAR | Status: DC
  Administered 2016-04-14: 60 mg via INTRAVENOUS
  Filled 2016-04-14 (×3): qty 6

## 2016-04-14 MED ORDER — POLYETHYLENE GLYCOL 3350 17 G PO PACK
17.0000 g | PACK | Freq: Every day | ORAL | Status: DC
  Administered 2016-04-15 – 2016-04-29 (×13): 17 g via ORAL
  Filled 2016-04-14 (×14): qty 1

## 2016-04-14 MED ORDER — TRAMADOL HCL 50 MG PO TABS
50.0000 mg | ORAL_TABLET | Freq: Two times a day (BID) | ORAL | Status: DC | PRN
  Administered 2016-04-16 – 2016-04-29 (×16): 50 mg via ORAL
  Filled 2016-04-14 (×17): qty 1

## 2016-04-14 MED ORDER — METFORMIN HCL 500 MG PO TABS
1000.0000 mg | ORAL_TABLET | Freq: Two times a day (BID) | ORAL | Status: DC
  Administered 2016-04-15 – 2016-04-29 (×29): 1000 mg via ORAL
  Filled 2016-04-14 (×29): qty 2

## 2016-04-14 MED ORDER — ENOXAPARIN SODIUM 40 MG/0.4ML ~~LOC~~ SOLN
40.0000 mg | SUBCUTANEOUS | Status: DC
  Administered 2016-04-14 – 2016-04-27 (×14): 40 mg via SUBCUTANEOUS
  Filled 2016-04-14 (×14): qty 0.4

## 2016-04-14 MED ORDER — VITAMIN D 1000 UNITS PO TABS
1000.0000 [IU] | ORAL_TABLET | Freq: Two times a day (BID) | ORAL | Status: DC
  Administered 2016-04-15 – 2016-04-29 (×30): 1000 [IU] via ORAL
  Filled 2016-04-14 (×30): qty 1

## 2016-04-14 MED ORDER — SODIUM CHLORIDE 0.9% FLUSH
10.0000 mL | INTRAVENOUS | Status: DC | PRN
  Administered 2016-04-15: 10 mL
  Filled 2016-04-14: qty 40

## 2016-04-14 MED ORDER — NITROGLYCERIN 0.4 MG SL SUBL: 0.4000 mg | SUBLINGUAL_TABLET | SUBLINGUAL | Status: DC | PRN

## 2016-04-14 MED ORDER — ONDANSETRON HCL 4 MG/2ML IJ SOLN: 4.0000 mg | Freq: Four times a day (QID) | INTRAMUSCULAR | Status: DC | PRN

## 2016-04-14 MED ORDER — ACETAMINOPHEN 325 MG PO TABS
650.0000 mg | ORAL_TABLET | ORAL | Status: DC | PRN
  Administered 2016-04-22 – 2016-04-28 (×5): 650 mg via ORAL
  Filled 2016-04-14 (×5): qty 2

## 2016-04-14 MED ORDER — SODIUM CHLORIDE 0.9% FLUSH: 3.0000 mL | INTRAVENOUS | Status: DC | PRN

## 2016-04-14 MED ORDER — ALPRAZOLAM 0.5 MG PO TABS
0.5000 mg | ORAL_TABLET | Freq: Every evening | ORAL | Status: DC | PRN
  Filled 2016-04-14: qty 1

## 2016-04-14 MED ORDER — INSULIN ASPART PROT & ASPART (70-30 MIX) 100 UNIT/ML ~~LOC~~ SUSP
10.0000 [IU] | Freq: Two times a day (BID) | SUBCUTANEOUS | Status: DC
  Administered 2016-04-15 – 2016-04-29 (×26): 10 [IU] via SUBCUTANEOUS
  Filled 2016-04-14 (×3): qty 10

## 2016-04-14 MED ORDER — ALPRAZOLAM 0.25 MG PO TABS: 0.2500 mg | ORAL_TABLET | Freq: Two times a day (BID) | ORAL | Status: DC | PRN

## 2016-04-14 MED ORDER — SODIUM CHLORIDE 0.9% FLUSH
3.0000 mL | Freq: Two times a day (BID) | INTRAVENOUS | Status: DC
  Administered 2016-04-15 – 2016-04-17 (×7): 3 mL via INTRAVENOUS

## 2016-04-14 MED ORDER — DEXTROSE 5 % IV SOLN
2.0000 g | INTRAVENOUS | Status: DC
  Administered 2016-04-15 – 2016-04-20 (×7): 2 g via INTRAVENOUS
  Filled 2016-04-14 (×9): qty 2

## 2016-04-14 MED ORDER — ASPIRIN 81 MG PO CHEW
81.0000 mg | CHEWABLE_TABLET | Freq: Every day | ORAL | Status: DC
  Administered 2016-04-15 – 2016-04-29 (×14): 81 mg via ORAL
  Filled 2016-04-14 (×14): qty 1

## 2016-04-14 MED ORDER — INSULIN ASPART 100 UNIT/ML ~~LOC~~ SOLN
0.0000 [IU] | Freq: Three times a day (TID) | SUBCUTANEOUS | Status: DC
  Administered 2016-04-14 – 2016-04-18 (×3): 3 [IU] via SUBCUTANEOUS
  Administered 2016-04-18: 2 [IU] via SUBCUTANEOUS
  Administered 2016-04-19 – 2016-04-21 (×6): 3 [IU] via SUBCUTANEOUS
  Administered 2016-04-22: 4 [IU] via SUBCUTANEOUS
  Administered 2016-04-22 – 2016-04-23 (×3): 3 [IU] via SUBCUTANEOUS
  Administered 2016-04-24: 4 [IU] via SUBCUTANEOUS
  Administered 2016-04-24: 7 [IU] via SUBCUTANEOUS

## 2016-04-14 MED ORDER — ALBUTEROL SULFATE (2.5 MG/3ML) 0.083% IN NEBU
2.5000 mg | INHALATION_SOLUTION | Freq: Every day | RESPIRATORY_TRACT | Status: DC
  Administered 2016-04-14 – 2016-04-29 (×16): 2.5 mg via RESPIRATORY_TRACT
  Filled 2016-04-14 (×17): qty 3

## 2016-04-14 MED ORDER — ENSURE ENLIVE PO LIQD
237.0000 mL | Freq: Two times a day (BID) | ORAL | Status: DC
  Administered 2016-04-15 – 2016-04-29 (×26): 237 mL via ORAL

## 2016-04-14 MED ORDER — CEFTRIAXONE SODIUM 2 G IV SOLR: 2.0000 g | INTRAVENOUS | Status: DC

## 2016-04-14 MED ORDER — PRAVASTATIN SODIUM 10 MG PO TABS
10.0000 mg | ORAL_TABLET | Freq: Every day | ORAL | Status: DC
  Administered 2016-04-15 – 2016-04-29 (×16): 10 mg via ORAL
  Filled 2016-04-14 (×18): qty 1

## 2016-04-14 MED ORDER — LINAGLIPTIN 5 MG PO TABS
5.0000 mg | ORAL_TABLET | Freq: Every day | ORAL | Status: DC
  Administered 2016-04-15 – 2016-04-29 (×14): 5 mg via ORAL
  Filled 2016-04-14 (×15): qty 1

## 2016-04-14 MED ORDER — SENNA 8.6 MG PO TABS: 1.0000 | ORAL_TABLET | Freq: Two times a day (BID) | ORAL | Status: DC | PRN

## 2016-04-14 MED ORDER — INSULIN ASPART 100 UNIT/ML ~~LOC~~ SOLN: 0.0000 [IU] | Freq: Every day | SUBCUTANEOUS | Status: DC

## 2016-04-14 MED ORDER — ATENOLOL 25 MG PO TABS
25.0000 mg | ORAL_TABLET | Freq: Two times a day (BID) | ORAL | Status: DC
  Administered 2016-04-15 – 2016-04-29 (×29): 25 mg via ORAL
  Filled 2016-04-14 (×35): qty 1

## 2016-04-14 MED ORDER — SODIUM CHLORIDE 0.9 % IV SOLN: 250.0000 mL | INTRAVENOUS | Status: DC | PRN

## 2016-04-14 MED ORDER — SPIRONOLACTONE 25 MG PO TABS
50.0000 mg | ORAL_TABLET | Freq: Every day | ORAL | Status: DC
  Administered 2016-04-15 – 2016-04-29 (×15): 50 mg via ORAL
  Filled 2016-04-14 (×15): qty 2

## 2016-04-14 MED ORDER — ALBUTEROL SULFATE (2.5 MG/3ML) 0.083% IN NEBU
2.5000 mg | INHALATION_SOLUTION | RESPIRATORY_TRACT | Status: DC | PRN
  Administered 2016-04-24: 2.5 mg via RESPIRATORY_TRACT

## 2016-04-14 MED ORDER — ALOGLIPTIN-METFORMIN HCL 12.5-1000 MG PO TABS: 1.0000 | ORAL_TABLET | Freq: Two times a day (BID) | ORAL | Status: DC

## 2016-04-14 MED ORDER — CETYLPYRIDINIUM CHLORIDE 0.05 % MT LIQD
7.0000 mL | Freq: Two times a day (BID) | OROMUCOSAL | Status: DC
  Administered 2016-04-15 – 2016-04-29 (×27): 7 mL via OROMUCOSAL

## 2016-04-14 MED ORDER — POTASSIUM CHLORIDE CRYS ER 20 MEQ PO TBCR
20.0000 meq | EXTENDED_RELEASE_TABLET | Freq: Two times a day (BID) | ORAL | Status: DC
  Administered 2016-04-15 – 2016-04-20 (×13): 20 meq via ORAL
  Filled 2016-04-14 (×12): qty 1

## 2016-04-14 MED ORDER — FLUCONAZOLE 100 MG PO TABS: 100.0000 mg | ORAL_TABLET | Freq: Every day | ORAL | Status: DC

## 2016-04-14 NOTE — H&P (Signed)
Admission History and Physical Note:    Date:  04/14/2016   ID:  Margaret [name], DOB 17-Aug-1956, MRN [id number]  PCP:  Harvie Junior, MD  Cardiologist:  Dr. Ena Dawley   Electrophysiologist:  n/a  Referring MD: Harvie Junior, MD   Chief Complaint  Patient presents with  . Congestive Heart Failure    History of Present Illness:     Margaret [name] is a 60 y.o. female with a hx of COPD, HFpEF (seen by Dr. Tamala Julian during hospital admission 7/13), OSA, HTN, HL, poorly controlled diabetes, tobacco abuse.   Admitted in 2/17 with splenic infarct and was placed on Xarelto for anticoagulation.   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 She was noted to have strep viridans bacteremia and her presentation was felt to be related to septic emboli. 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 She underwent endovascular thrombectomy by IR. It was felt that she would need to undergo rehabilitation with significant recovery before proceeding to mitral valve surgery. She was discharged to [facility].   I saw her 03/24/16. She was volume overloaded. I adjusted her Lasix. Repeat Echo demonstrated persistent MV vegetation and severe MR. I discussed her case with Dr. Roxy Manns.  Dr. Linus Salmons with ID has continued her on IV antibiotics.  She missed her appointment with me yesterday. She arrived late from SNF and the [place] lift system was broken (she could not get off the [place]).  She did see Dr. Roxy Manns.  He reviewed her case with Dr. Erlinda Hong and Dr. Meda Coffee.  Dr. Erlinda Hong felt like the patient should hold off on surgery for her MV for 6 mos (from a neuro standpoint).  Dr. Roxy Manns felt that her cardiac status was tenuous and that she was at risk for recurrent embolization.  It was felt she would do  poorly with surgery.  She was added back on to my schedule today for evaluation with an eye towards admission for IV diuresis.    She returns for FU. Here with her husband.  She remains at [facility]. She is obviously short of breath at rest. She has to sleep sitting up.  She notes + PND.  LE edema is worse.  Denies any bleeding, fever or significant cough.     Past Medical History  Diagnosis Date  . Hypertension   . Hyperlipidemia   . COPD (chronic obstructive pulmonary disease) ([place]) 04/20/12  . Asthma   . Type II diabetes mellitus ([place])   . Anemia   . H/O hiatal hernia   . Osteoarthritis   . Pneumonia 04/2012  . Migraines   . Panic attacks 04/20/12  . Morbid obesity ([place])   . Heart murmur   . Bacterial endocarditis     Strep viridans   . Chronic diastolic CHF (congestive heart failure) ([place]) 04/27/2012  . Pulmonary hypertension assoc with unclear multi-factorial mechanisms ([place]) 01/23/2016  . Positive ANA (antinuclear antibody)   . 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 Stroke ([place])   . Subarachnoid bleed ([place]) 02/01/2016    Past Surgical History  Procedure Laterality Date  . Laceration repair  1966    "right upper arm"  . Dilation and curettage of uterus  1990's  . Finger reattachment  1973    "right ring finger"  . Tee without cardioversion N/A 02/05/2016    Procedure:  TRANSESOPHAGEAL ECHOCARDIOGRAM (TEE);  Surgeon: Larey Dresser, MD;  Location: [facility];  Service: Cardiovascular;  Laterality: N/A;  . Radiology with anesthesia N/A 02/06/2016    Procedure: RADIOLOGY WITH ANESTHESIA;  Surgeon: Luanne Bras, MD;  Location: [facility];  Service: Radiology;  Laterality: N/A;    Current Medications: Outpatient Prescriptions Prior to Visit  Medication Sig Dispense Refill  . albuterol (PROVENTIL HFA;VENTOLIN HFA) 108 (90 BASE) MCG/ACT inhaler Inhale 2 puffs into the lungs every 4 (four) hours as needed. For shortness of breath.    Marland Kitchen albuterol (PROVENTIL) (2.5 MG/3ML) 0.083%  nebulizer solution Take 2.5 mg by nebulization at bedtime.     . Alogliptin-Metformin HCl 12.03-999 MG TABS Take 1 tablet by mouth 2 (two) times daily.    Marland Kitchen ALPRAZolam (XANAX) 0.5 MG tablet Take 0.5 mg by mouth at bedtime as needed for anxiety. Reported on 04/09/2016    . aspirin 81 MG tablet Take 81 mg by mouth daily.    Marland Kitchen atenolol (TENORMIN) 25 MG tablet Take 25 mg by mouth 2 (two) times daily.    . cefTRIAXone (ROCEPHIN) 2 g SOLR injection Inject 2 g into the vein daily. 10 each 0  . cholecalciferol (VITAMIN D) 1000 units tablet Take 1,000 Units by mouth 2 (two) times daily.    . fluconazole (DIFLUCAN) 100 MG tablet Take 1 tablet (100 mg total) by mouth daily.    . furosemide (LASIX) 40 MG tablet Take 40 mg by mouth 2 (two) times daily.    Marland Kitchen ibuprofen (ADVIL,MOTRIN) 200 MG tablet Take 600 mg by mouth every 6 (six) hours as needed for headache or mild pain.    Marland Kitchen insulin NPH-regular (NOVOLIN 70/30) (70-30) 100 UNIT/ML injection Inject 6-16 Units into the skin 2 (two) times daily with a meal.    . lovastatin (MEVACOR) 20 MG tablet Take 10 mg by mouth at bedtime.    . nitroGLYCERIN (NITROSTAT) 0.4 MG SL tablet Place 1 tablet (0.4 mg total) under the tongue every 5 (five) minutes x 3 doses as needed for chest pain. 25 tablet 3  . polyethylene glycol (MIRALAX / GLYCOLAX) packet Take 17 g by mouth daily.    Marland Kitchen senna (SENOKOT) 8.6 MG tablet Take 1 tablet by mouth 2 (two) times daily as needed for constipation.    Marland Kitchen spironolactone (ALDACTONE) 25 MG tablet Take 50 mg by mouth daily. Reported on 04/09/2016    . oxyCODONE-acetaminophen (PERCOCET) 10-325 MG tablet Take 1 tablet by mouth 2 (two) times daily as needed for pain. (Patient not taking: Reported on 04/14/2016) 30 tablet 0   No facility-administered medications prior to visit.      Allergies:   Codeine and Latex   Social History   Social History  . Marital Status: Married    Spouse Name: N/A  . Number of Children: N/A  . Years of Education:  N/A   Social History Main Topics  . Smoking status: Former Smoker -- 0.20 packs/day for 40 years    Types: Cigarettes    Quit date: 04/20/2012  . Smokeless tobacco: Never Used     Comment: 6/5//13 "a pack of cigarettes would last me 1 - 1 1/2 wk"  . Alcohol Use: No  . Drug Use: No  . Sexual Activity: Not Currently     Comment: married, 2 children, work with computers   Other Topics Concern  . None   Social History Narrative     Family History:  [name] family history includes COPD in her  father; Clotting disorder in her maternal grandmother; Coronary artery disease (age of onset: 67) in her mother; Stroke in her father.   ROS:   Please see the history of present illness.    Review of Systems  Constitution: Positive for malaise/fatigue. Negative for fever.  Gastrointestinal: Negative for hematochezia and melena.   All other systems reviewed and are negative.   Physical Exam:    VS:  BP 116/60 mmHg  Pulse 70  Ht 5\' 5"  (1.651 m)  Wt 260 lb (117.935 kg)  BMI 43.27 kg/m2  SpO2 92%   GEN: Well nourished, well developed, in no acute distress HEENT: normal Neck: I cannot assess JVD, no masses Cardiac: Normal S1/S2, RRR; I cannot appreciate a murmur today, massive bilateral LE edema   Respiratory:  Decreased breath sounds with bilateral crackles 1/2 up chest GI:  distended MS: no deformity or atrophy Skin: warm and dry Neuro: L hemiparesis noted  Psych: Alert and oriented x 3, normal affect  Wt Readings from Last 3 Encounters:  04/14/16 260 lb (117.935 kg)  04/13/16 240 lb (108.863 kg)  03/24/16 240 lb (108.863 kg)      Studies/Labs Reviewed:     EKG:  EKG is not ordered today.  The ekg ordered today demonstrates n/a  Recent Labs: 02/12/2016: ALT 14; B Natriuretic Peptide 137.3* 02/13/2016: Hemoglobin 11.1*; Magnesium 2.3; Platelets 616* 03/24/2016: BUN 9; Creat 0.77; Potassium 4.7; Sodium 137   Recent Lipid Panel    Component Value Date/Time   CHOL 155  02/02/2016 1510   TRIG 137 02/02/2016 1510   HDL 21* 02/02/2016 1510   CHOLHDL 7.4 02/02/2016 1510   VLDL 27 02/02/2016 1510   LDLCALC 107* 02/02/2016 1510    Additional studies/ records that were reviewed today include:   Echo 03/31/16 Moderate concentric LVH, EF 60-65%, normal wall motion, grade 2 diastolic dysfunction, severe MR, severe LAE, mild RAE, highly mobile echodensity attached to posterior mitral valve leaflet consistent with vegetation measuring 24 mm  LE Venous Duplex 02/14/16 No DVT bilaterally  TEE 02/05/16 EF 60-65%, normal wall motion, mild plaque and ascending thoracic aorta, mitral valve with mobile vegetation on the posterior leaflet measuring 2 cm, posterior and anterior leaflets did not coapt properly, severe MR, MAC, mild LAE, normal RVEF Impressions:- Mitral valve endocarditis involving posterior leaflet with severe, anterioly-directed mitral regurgitation.  Echo 01/14/16 Mild concentric LVH, EF 123456, grade 2 diastolic dysfunction, MAC, mild MR, trivial PI   ASSESSMENT:     1. Acute on chronic diastolic congestive heart failure due to valvular disease ([place])   2. Endocarditis of mitral valve   3. Mitral regurgitation   4. History of CVA (cerebrovascular accident)   5. Essential hypertension   6. HLD (hyperlipidemia)     PLAN:     In order of problems listed above:  1. CHF - She is massively volume overloaded (anasarca).  She needs inpatient management for HF.  She would likely do better with inpatient IV diuresis and evaluation by the HF Team.  She will need to be allowed to sit up (she cannot lay flat at all). She will likely need inpatient rehab after recovery from acute HF to help with her recovery from her CVA.   -  Lasix 60 mg IV bid  -  Keep close eye on renal function and K+  -  D/w with out inpatient team >> consult HF Team tomorrow.   2. Infective Endocarditis - Recent echo with highly mobile echodensity attached to posterior  MV leaflet  measuring 24 mm. In some views it appeared to possibly be larger.  She remains on IV antibiotics.  Will likely need ID to follow her as well (depending on longevity of admission).  3. Mitral Regurgitation - Related to #1.Will need to involve Dr. Roxy Manns again at some point if HF cannot be stabilized with medical therapy.    4. CVA - s/p septic embolic CVA with assoc SAH as well as septic emboli to the spleen x 2. As noted, previous hospital stay c/b acute RICA occlusion requiring thrombectomy. She continues to have L sided hemiparesis.  Neuro has recommended 6 mos of recovery prior to proceeding with MV surgery. She remains quite weak and may need inpatient rehab once stable from a HF standpoint to help with recovery from CVA.  5. HTN - Continue current regimen.   6. HL - Continue statin  7. Diabetes - Continue current regimen. Cover with sliding scale insulin.    Signed, Richardson Dopp, PA-C  04/14/2016 4:45 PM    [facility] [address], [place], [place]  [address] Phone: [phone]; Fax: [phone]   The patient was seen, examined and discussed with Richardson Dopp, PA-C and I agree with the above.   This is a very pleasant and very unfortunate 60 year old female with history of CHF with preserved ejection fraction who was diagnosed with mitral valve endocarditis complicated by splenic infarct and probably are 0000000 and embolic CVA with associated subarachnoid hemorrhage in March 2017. The culprit for the endocarditis was strep. On spectral room air and she had been treated with IV antibiotics ever since with the plan for mitral valve replacement once she recovers from her stroke. She has been on anticoagulation with Xarelto. Her most recent echocardiogram showed preserved LVEF, vegetation of the mitral valve that is not improving and is possibly larger and severe mitral regurgitation. The patient has been in [place] and being followed in our  clinic where she was seen couple weeks ago and was found to be fluid overloaded so her Lasix was increased. However at the nursing home for unknown reason her Lasix was actually decreased.  She was seen by Dr. Roxy Manns yesterday who called [place] to tell us that she seems to be fluid overloaded and possibly needs inpatient diuresis, he still considers her possible surgical candidate considering her recovery from her stroke but not unless she is optimized from her heart failure standpoint and undergoes some degree of rehabilitation prior to surgery.  She was seen today in the clinic by a PA Richardson Dopp and myself, the patient is gasping for breath on oxygen she is very uncomfortable with shortness of breath in upright position, she has crackles up to half of her lungs, her legs are massively edematous, moving skin lesions suspicious of cellulitis secondary to swelling. She is currently 20 pounds over her weight 3 weeks ago.  We have decided to admit the patient to the hospital for IV diuresis patient is very thankful as she feel horrible and unable to breathe. We will admit to stepdown with the plan for aggressive IV diuresis, once she is improved we will plan for inpatient rehabilitation for some conditioning and improvement of strength prior to possible mitral valve repair.  Ena Dawley 04/14/2016

## 2016-04-14 NOTE — Patient Instructions (Signed)
YOU ARE BEING ADMITTED TO  2 HEART UNIT.

## 2016-04-15 ENCOUNTER — Inpatient Hospital Stay (HOSPITAL_COMMUNITY): Payer: Medicaid Other

## 2016-04-15 DIAGNOSIS — I340 Nonrheumatic mitral (valve) insufficiency: Principal | ICD-10-CM

## 2016-04-15 LAB — BASIC METABOLIC PANEL
ANION GAP: 8 (ref 5–15)
BUN: 8 mg/dL (ref 6–20)
CHLORIDE: 93 mmol/L — AB (ref 101–111)
CO2: 36 mmol/L — ABNORMAL HIGH (ref 22–32)
Calcium: 10.7 mg/dL — ABNORMAL HIGH (ref 8.9–10.3)
Creatinine, Ser: 0.72 mg/dL (ref 0.44–1.00)
GFR calc Af Amer: >60 mL/min (ref >60)
GLUCOSE: 118 mg/dL — AB (ref 65–99)
POTASSIUM: 4 mmol/L (ref 3.5–5.1)
Sodium: 137 mmol/L (ref 135–145)

## 2016-04-15 LAB — GLUCOSE, CAPILLARY
Glucose-Capillary: 139 mg/dL — ABNORMAL HIGH (ref 65–99)
Glucose-Capillary: 105 mg/dL — ABNORMAL HIGH (ref 65–99)
Glucose-Capillary: 125 mg/dL — ABNORMAL HIGH (ref 65–99)
Glucose-Capillary: 103 mg/dL — ABNORMAL HIGH (ref 65–99)

## 2016-04-15 LAB — MAGNESIUM: MAGNESIUM: 2.2 mg/dL (ref 1.7–2.4)

## 2016-04-15 MED ORDER — CLONAZEPAM 0.5 MG PO TABS
0.5000 mg | ORAL_TABLET | Freq: Every evening | ORAL | Status: DC | PRN
  Administered 2016-04-15 (×2): 0.5 mg via ORAL
  Filled 2016-04-15 (×2): qty 1

## 2016-04-15 MED ORDER — CLONAZEPAM 0.5 MG PO TABS
0.5000 mg | ORAL_TABLET | Freq: Three times a day (TID) | ORAL | Status: DC | PRN
  Administered 2016-04-16: 0.5 mg via ORAL
  Filled 2016-04-15: qty 1

## 2016-04-15 MED ORDER — FUROSEMIDE 10 MG/ML IJ SOLN
80.0000 mg | Freq: Two times a day (BID) | INTRAMUSCULAR | Status: DC
  Administered 2016-04-15 – 2016-04-20 (×11): 80 mg via INTRAVENOUS
  Filled 2016-04-15 (×11): qty 8

## 2016-04-15 MED ORDER — CLONAZEPAM 0.5 MG PO TABS
0.2500 mg | ORAL_TABLET | Freq: Two times a day (BID) | ORAL | Status: DC | PRN
  Administered 2016-04-15: 0.25 mg via ORAL
  Filled 2016-04-15: qty 1

## 2016-04-15 MED ORDER — CLONAZEPAM 0.5 MG PO TABS
0.2500 mg | ORAL_TABLET | Freq: Three times a day (TID) | ORAL | Status: DC | PRN
  Administered 2016-04-15: 0.25 mg via ORAL
  Filled 2016-04-15: qty 1

## 2016-04-15 MED ORDER — SALINE SPRAY 0.65 % NA SOLN
1.0000 | NASAL | Status: DC | PRN
  Filled 2016-04-15: qty 44

## 2016-04-15 NOTE — Consult Note (Addendum)
WOC wound consult note Reason for Consult: Consult requested for groin and legs.  Pt had open wounds to bilat groins during previous admission in March according to the EMR, but they appear to be healed upon this assessment. Wound type: Bilat legs with generalized edema and erythremia, currently no open wounds or drainage. Pt states she has worn compression wraps in the past but admits she does not like them. Dressing procedure/placement/frequency: No topical treatment indicated for wound care at this time.  If primary team  desires Ardelia Mems boots to be resumed for edema control, then please order and ortho tech will apply and change weekly. Please re-consult if further assistance is needed.  Thank-you,  Julien Girt MSN, [place], [place], [place], [place]

## 2016-04-15 NOTE — Progress Notes (Signed)
Pt experienced 13 beat run of Vtach. Dr. Claiborne Billings notified and ordered BMP and magnesium to be drawn as morning labs.

## 2016-04-15 NOTE — Progress Notes (Signed)
[address]       [place] [address]             [phone]        CARDIOTHORACIC SURGERY PROGRESS NOTE  Subjective: By report patient was very short of breath earlier but has done better this afternoon.  Wakes up anxious after dozing off sitting up in chair.  Cannot lie down in bed at all.  Currently reports feeling cold.  Worked briefly w/ PT earlier today  Objective: Vital signs: BP Readings from Last 1 Encounters:  04/15/16 102/69   Pulse Readings from Last 1 Encounters:  04/15/16 61   Resp Readings from Last 1 Encounters:  04/15/16 22   Temp Readings from Last 1 Encounters:  04/15/16 98 F (36.7 C) Oral    Hemodynamics:    Physical Exam:  Rhythm:   sinus  Breath sounds: Diminished at bases w/ bibasilar rales  Heart sounds:  RRR w/ systolic murmur  Incisions:  n/a  Abdomen:  Obese, soft, non tender  Extremities:  Warm, swollen, no skin breakdown   Intake/Output from previous day: 05/23 0701 - 05/24 0700 In: 50 [IV Piggyback:50] Out: 450 [Urine:450] Intake/Output this shift: Total I/O In: 270 [P.O.:240; I.V.:30] Out: 1500 [Urine:1500]  Lab Results:  CBC: Recent Labs  04/14/16 1840  WBC 13.4*  HGB 11.4*  HCT 39.0  PLT 596*    BMET:  Recent Labs  04/14/16 1840 04/15/16 0320  NA 138 137  K 4.3 4.0  CL 98* 93*  CO2 31 36*  GLUCOSE 122* 118*  BUN 8 8  CREATININE 0.82 0.72  CALCIUM 10.7* 10.7*     PT/INR:   Recent Labs  04/14/16 1840  LABPROT 14.6  INR 1.12    CBG (last 3)   Recent Labs  04/15/16 0847 04/15/16 1254 04/15/16 1640  GLUCAP 139* 105* 125*    ABG    Component Value Date/Time   PHART 7.310* 02/07/2016 0003   PCO2ART 59.3* 02/07/2016 0003   PO2ART 379.0* 02/07/2016 0003   HCO3 30.0* 02/07/2016 0003   TCO2 32 02/07/2016 0003   O2SAT 100.0 02/07/2016 0003    CXR: CHEST 2 VIEW  COMPARISON: 04/14/2016  FINDINGS: Mild progression of diffuse bilateral airspace disease  consistent with edema. Right lower lobe volume loss and right effusion unchanged. No significant effusion on the left.  Right jugular central venous catheter tip in the SVC unchanged.  IMPRESSION: Diffuse bilateral pulmonary edema with mild progression  Right lower lobe volume loss and small right effusion unchanged.   Electronically Signed  By: Franchot Gallo M.D.  On: 04/15/2016 07:38   Assessment/Plan:  Acute exacerbation of CHF w/ underlying severe mitral regurgitation secondary to bacterial endocarditis.  No fevers but white blood count remains slightly elevated. Follow-up blood cultures sent, results pending. Previous groin breakdown has reportedly improved. The patient remains stable from a neurologic standpoint.  I agree that the patient needs more aggressive intravenous diuretic therapy for treatment of her congestive heart failure and pulmonary edema. Will ask the advanced heart failure team to assist with management.  She will ultimately need follow-up MRI and CT angiogram of the head as previously recommended by Dr. Erlinda Hong.  If her congestive heart failure and respiratory status can be optimized she will need left and right heart catheterization if high-risk surgery is to be considered.  She will also need dental service consult and may need dental extraction.  I spent in excess of 15 minutes  during the conduct of this hospital encounter and >50% of this time involved direct face-to-face encounter with the patient for counseling and/or coordination of their care.   Rexene Alberts, MD 04/15/2016 6:28 PM

## 2016-04-15 NOTE — Progress Notes (Signed)
Nutrition Brief Note  Patient identified on the Malnutrition Screening Tool (MST) Report  Wt Readings from Last 15 Encounters:  04/15/16 256 lb 2.8 oz (116.2 kg)  04/14/16 260 lb (117.935 kg)  04/13/16 240 lb (108.863 kg)  03/24/16 240 lb (108.863 kg)  02/13/16 259 lb 14.8 oz (117.9 kg)  01/23/16 280 lb 14.4 oz (127.415 kg)  01/16/16 291 lb 1.6 oz (132.042 kg)  10/09/13 293 lb 1.6 oz (132.949 kg)  06/28/12 296 lb 9.6 oz (134.537 kg)  06/20/12 299 lb 2.6 oz (135.7 kg)  05/17/12 288 lb 6.4 oz (130.817 kg)  04/30/12 289 lb 0.4 oz (131.1 kg)  04/20/12 290 lb 12.8 oz (131.906 kg)   Margaret [name] is a 60 y.o. female with a hx of COPD, HFpEF (seen by Dr. Tamala Julian during hospital admission 7/13), OSA, HTN, HL, poorly controlled diabetes, tobacco abuse.   Pt admitted with CHF.   Case discussed with RN; pt has been very anxious this shift and requests RD not interview pt or perform exam at this time. RN reports that pt typically eats well, but ate very little breakfast secondary to her anxiety. Pt does not appear to have any fat or muscle loss. Wt loss likely due to diuresis. Per chart review, pt was very fluid overloaded upon admission.   Labs reviewed.   Body mass index is 42.63 kg/(m^2). Patient meets criteria for extreme obesity, class III based on current BMI.   Current diet order is Heart Healthy/ Carb Modified, patient is consuming approximately n/a% of meals at this time. Labs and medications reviewed.   No nutrition interventions warranted at this time. If nutrition issues arise, please consult RD.   Margaret [name] A. Jimmye Norman, RD, LDN, CDE Pager: [phone] After hours Pager: [phone]

## 2016-04-15 NOTE — Evaluation (Signed)
Physical Therapy Evaluation Patient Details Name: Margaret [name] MRN: [id number] DOB: [birth year] Today's Date: 04/15/2016   History of Present Illness  Margaret [name] is a 60 y.o. female admitted with acute  CHF exacerbation.  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  Clinical Impression  Patient presents with decreased mobility due to deficits listed in PT problem list below and will benefit from skilled PT in the acute setting to allow return home with spouse assist following continued SNF level rehab stay.    Follow Up Recommendations SNF (reports prefers to go to [facility] than [facility])    Equipment Recommendations  None recommended by PT    Recommendations for Other Services       Precautions / Restrictions Precautions Precautions: Fall Precaution Comments: L hemiparesis      Mobility  Bed Mobility Overal bed mobility: Needs Assistance             General bed mobility comments: RN assisted up to [facility]  Transfers Overall transfer level: Needs assistance Equipment used: Rolling walker (2 wheeled) Transfers: Sit to/from [facility] Sit to Stand: Max assist Stand pivot transfers: Mod assist;+2 physical assistance       General transfer comment: assist up from [facility] with two trials and cues for success, able to stand and turn leading with R LE to get to recliner with R hand on walker.  Scooted L foot back partway, then brought chair up to pt; spouse in room and assisting with walker management and encouragement  Ambulation/Gait                Stairs            Wheelchair Mobility    Modified Rankin (Stroke Patients Only)       Balance Overall balance assessment: Needs assistance   Sitting balance-Leahy Scale:  Fair     Standing balance support: Single extremity supported Standing balance-Leahy Scale: Poor Standing balance comment: assist for balance with R UE support                             Pertinent Vitals/Pain Pain Assessment: No/denies pain    Home Living Family/patient expects to be discharged to:: Skilled nursing facility                 Additional Comments: spouse states hoped to be able to take pt home, but did not get ramp completed.  States SW was supposed to be working on it.     Prior Function Level of Independence: Needs assistance   Gait / Transfers Assistance Needed: at SNF ambulating a little in parallel bars, assist for transfers and gets around in w/c           Hand Dominance   Dominant Hand: Right    Extremity/Trunk Assessment   Upper Extremity Assessment: LUE deficits/detail       LUE Deficits / Details: unable to grip, lifts shoulder minimally   Lower Extremity Assessment: LLE deficits/detail;RLE deficits/detail RLE Deficits / Details: AAROM WFL,. strength at least 3+/5 lots of fluid in legs making them heavy LLE Deficits / Details: unable to dorsiflex ankle, PF strength 2+/5, hip flexion 2-/5  Cervical / Trunk Assessment: Other exceptions  Communication   Communication: Expressive  difficulties (dysarthria)  Cognition Arousal/Alertness: Awake/alert Behavior During Therapy: WFL for tasks assessed/performed Overall Cognitive Status: History of cognitive impairments - at baseline                      General Comments General comments (skin integrity, edema, etc.): spouse in room and reports home is not acessible for w/c at this time; encouraged him to contact SW at [facility] about what arrangement she was working on    Exercises        Assessment/Plan    PT Assessment Patient needs continued PT services  PT Diagnosis Generalized weakness   PT Problem List Decreased mobility;Decreased balance;Decreased  strength;Decreased knowledge of use of DME;Decreased safety awareness;Decreased knowledge of precautions;Decreased coordination;Decreased skin integrity;Decreased activity tolerance;Obesity  PT Treatment Interventions DME instruction;Balance training;Functional mobility training;Patient/family education;Therapeutic activities;Therapeutic exercise;Wheelchair mobility training;Neuromuscular re-education   PT Goals (Current goals can be found in the Care Plan section) Acute Rehab PT Goals Patient Stated Goal: To get stronger PT Goal Formulation: With patient/family Time For Goal Achievement: 04/22/16 Potential to Achieve Goals: Fair    Frequency Min 2X/week   Barriers to discharge        Co-evaluation               End of Session Equipment Utilized During Treatment: Gait belt Activity Tolerance: Patient tolerated treatment well Patient left: in chair;with call bell/phone within reach           Time: 1355-1426 PT Time Calculation (min) (ACUTE ONLY): 31 min   Charges:   PT Evaluation $PT Eval Moderate Complexity: 1 Procedure PT Treatments $Therapeutic Activity: 8-22 mins   PT G CodesReginia Naas 05-12-2016, 4:12 PM Magda Kiel, [place] 05/12/2016

## 2016-04-15 NOTE — Progress Notes (Signed)
Patient is from [facility]. CSW will continue to follow for transfer back to [facility] when medically stable and cleared for discharge.          Emiliano Dyer, LCSW [facility] ED/42M Clinical Social Worker [phone]

## 2016-04-15 NOTE — Patient Instructions (Signed)
EXTENSION: Supine (Eccentric)    Lie on back, legs over bolster. With assistance, extend right knee and slowly lower over 6___ seconds. Complete __9_ sets of __9_ repetitions. Perform 2___ sessions per day.  Copyright  VHI. All rights reserved.  FLEXION: Supine (Manual Resistance)    Lie on back, legs straight. Against resistance, draw right knee toward chest. Complete ___ sets of ___ repetitions. Perform ___ sessions per day.  Copyright  VHI. All rights reserved.

## 2016-04-15 NOTE — Progress Notes (Signed)
Alprazolam ordered for anxiety. Pt refused and became anxious because she stated that alprazolam causes her to be severely drowsy and unable to interact. Awaiting updated home medication list from husband who, per pharmacy, forgot to speak with them regarding updated information. MD notified and okay with change from alprazolam to clonazepam per pt request.

## 2016-04-15 NOTE — Progress Notes (Signed)
  I spoke to Dr. Roxy Manns this evening and reviewed chart.   Agree with further diuresis today. The AHF will see in the am to assist with management,   Breonia Kirstein,MD 11:15 PM

## 2016-04-15 NOTE — Progress Notes (Signed)
CSW consult acknowledged re: "difficulty obtaining some medications". CSW will staff with RN Case Manager for medication needs as CSW does not assist with medications. Please contact if new need(s) arise.          Emiliano Dyer, LCSW [facility] ED/9M Clinical Social Worker [phone]

## 2016-04-15 NOTE — Care Management Note (Signed)
Case Management Note  Patient Details  Name: Margaret [name] MRN: [id number] Date of Birth: Dec 04, 1955  Subjective/Objective:        Adm w heart failure            Action/Plan: has husband, in [facility] for rehab   Expected Discharge Date:                  Expected Discharge Plan:  [place]  In-House Referral:  Clinical Social Work  Discharge planning Services     Post Acute Care Choice:    Choice offered to:     DME Arranged:    DME Agency:     HH Arranged:    [place] Agency:     Status of Service:     Medicare Important Message Given:    Date Medicare IM Given:    Medicare IM give by:    Date Additional Medicare IM Given:    Additional Medicare Important Message give by:     If discussed at [place] of Stay Meetings, dates discussed:    Additional Comments:sw ref made, ur review done  Lacretia Leigh, RN 04/15/2016, 10:18 AM

## 2016-04-15 NOTE — Consult Note (Signed)
[place] stopped in to visut with pt; pt was working with Physical Therapist..  [place] will try and revisit this afternoon. 11:46 AM Gwynn Burly

## 2016-04-15 NOTE — Progress Notes (Signed)
PT Cancellation Note  Patient Details Name: Margaret [name] MRN: [id number] DOB: [birth year]   Cancelled Treatment:    Reason Eval/Treat Not Completed: Fatigue/lethargy limiting ability to participate; patient fatigued due to just getting cleaned up and getting a foley.  Will attempt again later as time permits.   Reginia Naas 04/15/2016, 11:50 AM   Magda Kiel, [place] 04/15/2016

## 2016-04-15 NOTE — Progress Notes (Signed)
Patient Name: VIRDIA SABATER Date of Encounter: 04/15/2016  Hospital Problem List     Principal Problem:   Acute on chronic diastolic congestive heart failure due to valvular disease [facility]) Active Problems:   Endocarditis of mitral valve   CHF due to valvular disease, acute on chronic, diastolic   Essential hypertension   Mitral regurgitation   Type 2 diabetes mellitus with circulatory disorder (HCC)   HLD (hyperlipidemia)   History of stroke    Subjective   Still dyspneic.  Wt down 4 lbs.  I/O inaccurate - incontinent of urine but had refused foley las night.  Nose is stuffy this AM and she'd like saline spray.  Also notes anxiety, which she takes klonopin for @ SNF and would like here.  Inpatient Medications    . albuterol  2.5 mg Nebulization QHS  . antiseptic oral rinse  7 mL Mouth Rinse BID  . aspirin  81 mg Oral Daily  . atenolol  25 mg Oral BID  . cefTRIAXone (ROCEPHIN)  IV  2 g Intravenous Q24H  . cholecalciferol  1,000 Units Oral BID  . enoxaparin (LOVENOX) injection  40 mg Subcutaneous Q24H  . feeding supplement (ENSURE ENLIVE)  237 mL Oral BID BM  . furosemide  80 mg Intravenous BID  . insulin aspart  0-20 Units Subcutaneous TID WC  . insulin aspart  0-5 Units Subcutaneous QHS  . insulin aspart protamine- aspart  10 Units Subcutaneous BID WC  . linagliptin  5 mg Oral Daily  . metFORMIN  1,000 mg Oral BID WC  . polyethylene glycol  17 g Oral Daily  . potassium chloride  20 mEq Oral BID  . pravastatin  10 mg Oral q1800  . sodium chloride flush  3 mL Intravenous Q12H  . spironolactone  50 mg Oral Daily    Vital Signs    Filed Vitals:   04/15/16 0400 04/15/16 0500 04/15/16 0600 04/15/16 0843  BP: 103/69     Pulse: 63 68 65   Temp: 97.6 F (36.4 C)   97.2 F (36.2 C)  TempSrc: Oral   Oral  Resp:    22  Height:      Weight:  256 lb 2.8 oz (116.2 kg)    SpO2: 97% 93% 98%     Intake/Output Summary (Last 24 hours) at 04/15/16 0951 Last data filed  at 04/15/16 0024  Gross per 24 hour  Intake     50 ml  Output    450 ml  Net   -400 ml   Filed Weights   04/14/16 1754 04/14/16 1922 04/15/16 0500  Weight: 260 lb 9.3 oz (118.2 kg) 260 lb 9.3 oz (118.2 kg) 256 lb 2.8 oz (116.2 kg)    Physical Exam    General: Pleasant, NAD. Neuro: Alert and oriented X 3. Moves all extremities spontaneously. Psych: Flat affect. HEENT:  Normal  Neck: Supple without bruits.  Obese, difficult to gauge jvp. Lungs:  Resp regular and unlabored, crackles 2/3 way up bilat. Heart: RRR no s3, s4, 3/6 syst murmur heard throughout. Abdomen: Soft, obese, non-tender, non-distended, BS + x 4.  Extremities: No clubbing, cyanosis.  2+ bilat LE edema with erythema of lower legs.  DP/PT/Radials 2+ and equal bilaterally.  Labs    CBC  Recent Labs  04/14/16 1840  WBC 13.4*  NEUTROABS 10.4*  HGB 11.4*  HCT 39.0  MCV 89.4  PLT 0000000*   Basic Metabolic Panel  Recent Labs  04/14/16 1840 04/15/16 0320  NA 138 137  K 4.3 4.0  CL 98* 93*  CO2 31 36*  GLUCOSE 122* 118*  BUN 8 8  CREATININE 0.82 0.72  CALCIUM 10.7* 10.7*  MG  --  2.2   Liver Function Tests  Recent Labs  04/14/16 1840  AST 20  ALT 14  ALKPHOS 75  BILITOT 0.3  PROT 7.3  ALBUMIN 3.0*    Telemetry    rsr/sb  ECG    Sb, 56, no acute st/t changes.  Radiology    Dg Orthopantogram  04/15/2016  CLINICAL DATA:  60 year old female with cardiac valvular disease and CHF. Poor dentition. Initial encounter. EXAM: ORTHOPANTOGRAM/PANORAMIC COMPARISON:  CTA neck 02/01/2016 FINDINGS: Due to motion artifact on this exam despite repeated attempts detail of the central mandibular dentition is inferior to that on 02/01/2016. Maxillary dentition is absent. No definite dental caries or periapical dental lucency is identified. The bilateral mandibular molars are absent. IMPRESSION: Due to motion and difficulty with this exam, dental detail is better on the 02/01/2016 Neck CTA. Maxillary dentition  is absent. No definite acute mandible dental inflammation. Electronically Signed   By: Genevie Ann M.D.   On: 04/15/2016 07:41   Dg Chest 2 View  04/15/2016  CLINICAL DATA:  CHF due to valvular disease. EXAM: CHEST  2 VIEW COMPARISON:  04/14/2016 FINDINGS: Mild progression of diffuse bilateral airspace disease consistent with edema. Right lower lobe volume loss and right effusion unchanged. No significant effusion on the left. Right jugular central venous catheter tip in the SVC unchanged. IMPRESSION: Diffuse bilateral pulmonary edema with mild progression Right lower lobe volume loss and small right effusion unchanged. Electronically Signed   By: Franchot Gallo M.D.   On: 04/15/2016 07:38   Dg Chest Port 1 View  04/14/2016  CLINICAL DATA:  Post PICC line placement. EXAM: PORTABLE CHEST 1 VIEW COMPARISON:  02/12/2016 FINDINGS: Examination demonstrates interval placement of right central venous catheter with tip over the SVC. No evidence of pneumothorax. The Lungs are hypoinflated with moderate perihilar bibasilar opacification likely interstitial edema, although cannot exclude infection. Likely small right pleural effusion. Stable cardiomegaly. There is calcified plaque over the aortic arch. Remainder of the exam is unchanged. IMPRESSION: Moderate perihilar and bibasilar opacification which may be due to interstitial edema versus infection. Small right pleural effusion. Stable cardiomegaly. Right IJ central venous catheter with tip over the SVC. No pneumothorax. Electronically Signed   By: Marin Olp M.D.   On: 04/14/2016 22:02    Assessment & Plan    1.  Acute on chronic diastolic chf:  Pt admitted from clinic yesterday 2/2 significant volume overload and worsening dyspnea/orthopnea.  She has responded well to lasix though I/O's inaccurate as she has been incontinent of urine and unwilling to have foley placed after initial attempt was unsuccessful.  She would be willing to try again this AM.  Wt is down  4 lbs.  She continues to have significant volume overload with crackles 2/3 way up bilat.  As renal fxn is nl, will escalate lasix dosing to 80 bid.  HR/BP well-controlled on  blocker and spironolactone.  Review of previous wts reveals that dry wt likely somewhere between 240 and 245.  She is currently 256.  2.  Mitral Regurgitation w/ Infective Endocarditis:  S/p admission in March with embolic CVA and subsequent finding of mitral valve vegetation.  Seen by Dr. Roxy Manns - felt to be a poor candidate for surgery in her current state. Neuro recs delay of surgery until @  least Sept (6 mos following CVA/SAH).  Cont rocephin.  3.  H/O Septic Embolic CVA/H/O Splenic Embolic Infarct:  As above.  In rehab as outpt.  4.  Essential HTN:  Stable on  blocker and spiro.  5.  HL:  LDL 107 in March.  Cont prava.  LFT's ok.  6.  Leukocytosis:  Afebrile.  On rocephin in setting of #2.  Check UA.  7.  Anxiety:  Change klonopin to 0.25 TID PRN.  8.  Type II DM: Gluc relatively stable. Cont current regimen.  Signed, Murray Hodgkins NP  Agree with note by Ignacia Bayley RNP  Very complex and unfortunate pt with SBE, severe MR and volume overload with anasarca. Agree that she needs aggressive diuresis. She has had sequela of MV SBE with CVA and splenic infarct. Difficult situation with regard to suitability and timing of MV surgery. She has 3-4+ pitting edema and rales 1/2 up bilat. Agree with adjusting diuretic dose.  Lorretta Harp, M.D., [place], [facility], [place] [address] [place], [place]  [address]  [phone] 04/15/2016 11:23 AM

## 2016-04-16 DIAGNOSIS — F0631 Mood disorder due to known physiological condition with depressive features: Secondary | ICD-10-CM | POA: Diagnosis present

## 2016-04-16 DIAGNOSIS — F410 Panic disorder [episodic paroxysmal anxiety] without agoraphobia: Secondary | ICD-10-CM | POA: Diagnosis present

## 2016-04-16 LAB — URINALYSIS, ROUTINE W REFLEX MICROSCOPIC
Bilirubin Urine: NEGATIVE
Glucose, UA: NEGATIVE mg/dL
KETONES UR: NEGATIVE mg/dL
NITRITE: NEGATIVE
PH: 5.5 (ref 5.0–8.0)
PROTEIN: 30 mg/dL — AB
Specific Gravity, Urine: 1.017 (ref 1.005–1.030)

## 2016-04-16 LAB — GLUCOSE, CAPILLARY
Glucose-Capillary: 98 mg/dL (ref 65–99)
Glucose-Capillary: 88 mg/dL (ref 65–99)
Glucose-Capillary: 110 mg/dL — ABNORMAL HIGH (ref 65–99)
Glucose-Capillary: 81 mg/dL (ref 65–99)

## 2016-04-16 LAB — CARBOXYHEMOGLOBIN
Carboxyhemoglobin: 1.4 % (ref 0.5–1.5)
METHEMOGLOBIN: 0.4 % (ref 0.0–1.5)
O2 Saturation: 75.7 %
Total hemoglobin: 11.8 g/dL — ABNORMAL LOW (ref 12.0–16.0)

## 2016-04-16 LAB — URINE MICROSCOPIC-ADD ON

## 2016-04-16 LAB — BASIC METABOLIC PANEL
Anion gap: 10 (ref 5–15)
BUN: 16 mg/dL (ref 6–20)
CHLORIDE: 89 mmol/L — AB (ref 101–111)
CO2: 36 mmol/L — AB (ref 22–32)
CREATININE: 0.87 mg/dL (ref 0.44–1.00)
Calcium: 10.9 mg/dL — ABNORMAL HIGH (ref 8.9–10.3)
GFR calc non Af Amer: >60 mL/min (ref >60)
Glucose, Bld: 95 mg/dL (ref 65–99)
Potassium: 4.1 mmol/L (ref 3.5–5.1)
Sodium: 135 mmol/L (ref 135–145)

## 2016-04-16 MED ORDER — GABAPENTIN 100 MG PO CAPS
100.0000 mg | ORAL_CAPSULE | Freq: Two times a day (BID) | ORAL | Status: DC
  Administered 2016-04-16 – 2016-04-20 (×8): 100 mg via ORAL
  Filled 2016-04-16 (×8): qty 1

## 2016-04-16 MED ORDER — CLONAZEPAM 0.5 MG PO TABS: 0.5000 mg | ORAL_TABLET | Freq: Every day | ORAL | Status: DC

## 2016-04-16 MED ORDER — SODIUM CHLORIDE 0.9% FLUSH
10.0000 mL | INTRAVENOUS | Status: DC | PRN
  Administered 2016-04-21 – 2016-04-24 (×2): 10 mL
  Administered 2016-04-28: 20 mL
  Filled 2016-04-16 (×3): qty 40

## 2016-04-16 MED ORDER — CLONAZEPAM 0.5 MG PO TABS: 0.2500 mg | ORAL_TABLET | Freq: Every day | ORAL | Status: DC | PRN

## 2016-04-16 MED ORDER — CLONAZEPAM 0.5 MG PO TABS
0.5000 mg | ORAL_TABLET | Freq: Two times a day (BID) | ORAL | Status: DC
  Administered 2016-04-16 – 2016-04-29 (×25): 0.5 mg via ORAL
  Filled 2016-04-16 (×25): qty 1

## 2016-04-16 MED ORDER — CLONAZEPAM 0.5 MG PO TABS
0.2500 mg | ORAL_TABLET | Freq: Every day | ORAL | Status: DC
  Administered 2016-04-16: 0.25 mg via ORAL
  Filled 2016-04-16: qty 1

## 2016-04-16 MED ORDER — ESCITALOPRAM OXALATE 10 MG PO TABS
5.0000 mg | ORAL_TABLET | Freq: Every day | ORAL | Status: DC
  Administered 2016-04-16 – 2016-04-19 (×4): 5 mg via ORAL
  Filled 2016-04-16 (×4): qty 1

## 2016-04-16 MED ORDER — CLONAZEPAM 0.5 MG PO TABS
0.2500 mg | ORAL_TABLET | Freq: Three times a day (TID) | ORAL | Status: DC | PRN
  Administered 2016-04-17 – 2016-04-18 (×3): 0.25 mg via ORAL
  Filled 2016-04-16 (×3): qty 1

## 2016-04-16 MED ORDER — SODIUM CHLORIDE 0.9% FLUSH
10.0000 mL | Freq: Two times a day (BID) | INTRAVENOUS | Status: DC
  Administered 2016-04-16 – 2016-04-28 (×15): 10 mL
  Administered 2016-04-28: 20 mL
  Administered 2016-04-30: 10 mL

## 2016-04-16 NOTE — Progress Notes (Signed)
Patient Name: Margaret [name] Date of Encounter: 04/16/2016  Primary Cardiologist: Dr. Meda Coffee  Patient profile: Margaret [name] is a 60 y.o. female with a hx of COPD, HFpEF (seen by Dr. 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     Principal Problem:   Acute on chronic diastolic congestive heart failure due to valvular disease (HCC) Active Problems:   Essential hypertension   Endocarditis of mitral valve   Mitral regurgitation   HLD (hyperlipidemia)   Type 2 diabetes mellitus with circulatory disorder (HCC)   History of stroke   CHF due to valvular disease, acute on chronic, diastolic    SUBJECTIVE  Denies any CP. Still has SOB, but improving. Still unable to lay flat due to SOB.   CURRENT MEDS . albuterol  2.5 mg Nebulization QHS  . antiseptic oral rinse  7 mL Mouth Rinse BID  . aspirin  81 mg Oral Daily  . atenolol  25 mg Oral BID  . cefTRIAXone (ROCEPHIN)  IV  2 g Intravenous Q24H  . cholecalciferol  1,000 Units Oral BID  . enoxaparin (LOVENOX) injection  40 mg Subcutaneous Q24H  . feeding supplement (ENSURE ENLIVE)  237 mL Oral BID BM  . furosemide  80 mg Intravenous BID  . insulin aspart  0-20 Units Subcutaneous TID WC  . insulin aspart  0-5 Units Subcutaneous QHS  . insulin aspart protamine- aspart  10 Units Subcutaneous BID WC  . linagliptin  5 mg Oral Daily  . metFORMIN  1,000 mg Oral BID WC  . polyethylene glycol  17 g Oral Daily  . potassium chloride  20 mEq Oral BID  . pravastatin  10 mg Oral q1800  . sodium chloride flush  3 mL Intravenous Q12H  . spironolactone  50 mg Oral Daily    OBJECTIVE  Filed Vitals:   04/16/16 0431 04/16/16 0500 04/16/16 0600 04/16/16 0746    BP: 94/68   130/71  Pulse: 65 58 64 58  Temp: 98.1 F (36.7 C)   97.7 F (36.5 C)  TempSrc: Oral   Oral  Resp: 22   18  Height:      Weight: 223 lb 8.7 oz (101.4 kg)     SpO2: 95% 97% 98% 96%    Intake/Output Summary (Last 24 hours) at 04/16/16 0920 Last data filed at 04/15/16 2230  Gross per 24 hour  Intake   1040 ml  Output   2850 ml  Net  -1810 ml   Filed Weights   04/14/16 1922 04/15/16 0500 04/16/16 0431  Weight: 260 lb 9.3 oz (118.2 kg) 256 lb 2.8 oz (116.2 kg) 223 lb 8.7 oz (101.4 kg)    PHYSICAL EXAM  General: Pleasant, NAD. Neuro: Alert and oriented X 3. Moves all extremities spontaneously. Psych: Normal affect. HEENT:  Normal  Neck: Supple without bruits. Unable to assess JVD Lungs:  Resp regular and unlabored. Difficult to hear breath sound given her obesity. R subclavian PICC line Heart: RRR no s3, s4. 2/6 systolic murmur Abdomen: Soft, non-tender, non-distended, BS + x 4.  Extremities: No clubbing, cyanosis. Bilateral brown skin changes in LE, unable to assess pulse due to significant edema. 3+ pitting edema  Accessory Clinical Findings  CBC  Recent Labs  04/14/16  1840  WBC 13.4*  NEUTROABS 10.4*  HGB 11.4*  HCT 39.0  MCV 89.4  PLT 0000000*   Basic Metabolic Panel  Recent Labs  04/15/16 0320 04/16/16 0329  NA 137 135  K 4.0 4.1  CL 93* 89*  CO2 36* 36*  GLUCOSE 118* 95  BUN 8 16  CREATININE 0.72 0.87  CALCIUM 10.7* 10.9*  MG 2.2  --    Liver Function Tests  Recent Labs  04/14/16 1840  AST 20  ALT 14  ALKPHOS 75  BILITOT 0.3  PROT 7.3  ALBUMIN 3.0*    TELE NSR    ECG  No new EKG  Echocardiogram 03/31/2016  LV EF: 60% - 65%  ------------------------------------------------------------------- Indications: MVD [non-rheumatic] 424.0.  ------------------------------------------------------------------- History: PMH: Panic attacks. Murmur.Bacterial endocarditis. Pulmonary hypertension. Subarachnoid bleed.  Congestive heart failure. Stroke. Chronic obstructive pulmonary disease. Risk factors: Hypertension. Diabetes mellitus. Dyslipidemia.  ------------------------------------------------------------------- Study Conclusions  - Left ventricle: The cavity size was normal. There was moderate  concentric hypertrophy. Systolic function was normal. The  estimated ejection fraction was in the range of 60% to 65%. Wall  motion was normal; there were no regional wall motion  abnormalities. Features are consistent with a pseudonormal left  ventricular filling pattern, with concomitant abnormal relaxation  and increased filling pressure (grade 2 diastolic dysfunction).  Doppler parameters are consistent with elevated ventricular  end-diastolic filling pressure. - Aortic valve: Trileaflet; normal thickness leaflets. There was no  regurgitation. - Aortic root: The aortic root was normal in size. - Mitral valve: Moderately thickened, moderately calcified leaflets  . There was severe regurgitation directed centrally and  anteriorly. - Left atrium: The atrium was severely dilated. - Right ventricle: The cavity size was normal. Wall thickness was  normal. Systolic function was normal. - Right atrium: The atrium was mildly dilated. - Pulmonic valve: There was mild regurgitation. - Pulmonary arteries: Systolic pressure was within the normal  range. - Inferior vena cava: The vessel was normal in size.  Impressions:  - There is a highly mobile echodensity attached to the posterior  mitral valve leaflet measuring 24 mm in its longest diameter.  This is most probably consistent with a vegetation.  When compared to the prior study from 02/11/2016 this might be  slightly increased in size.  Mitral regurgitation is severe.  Normal RVSP.     Radiology/Studies  Dg Orthopantogram  04/15/2016  CLINICAL DATA:  60 year old female with cardiac valvular disease and CHF. Poor  dentition. Initial encounter. EXAM: ORTHOPANTOGRAM/PANORAMIC COMPARISON:  CTA neck 02/01/2016 FINDINGS: Due to motion artifact on this exam despite repeated attempts detail of the central mandibular dentition is inferior to that on 02/01/2016. Maxillary dentition is absent. No definite dental caries or periapical dental lucency is identified. The bilateral mandibular molars are absent. IMPRESSION: Due to motion and difficulty with this exam, dental detail is better on the 02/01/2016 Neck CTA. Maxillary dentition is absent. No definite acute mandible dental inflammation. Electronically Signed   By: Genevie Ann M.D.   On: 04/15/2016 07:41   Dg Chest 2 View  04/15/2016  CLINICAL DATA:  CHF due to valvular disease. EXAM: CHEST  2 VIEW COMPARISON:  04/14/2016 FINDINGS: Mild progression of diffuse bilateral airspace disease consistent with edema. Right lower lobe volume loss and right effusion unchanged. No significant effusion on the left. Right jugular central venous catheter tip in the SVC unchanged. IMPRESSION: Diffuse bilateral pulmonary edema with mild progression Right lower lobe volume loss and small right effusion unchanged. Electronically Signed  By: Franchot Gallo M.D.   On: 04/15/2016 07:38   Dg Chest Port 1 View  04/14/2016  CLINICAL DATA:  Post PICC line placement. EXAM: PORTABLE CHEST 1 VIEW COMPARISON:  02/12/2016 FINDINGS: Examination demonstrates interval placement of right central venous catheter with tip over the SVC. No evidence of pneumothorax. The Lungs are hypoinflated with moderate perihilar bibasilar opacification likely interstitial edema, although cannot exclude infection. Likely small right pleural effusion. Stable cardiomegaly. There is calcified plaque over the aortic arch. Remainder of the exam is unchanged. IMPRESSION: Moderate perihilar and bibasilar opacification which may be due to interstitial edema versus infection. Small right pleural effusion. Stable cardiomegaly. Right IJ  central venous catheter with tip over the SVC. No pneumothorax. Electronically Signed   By: Marin Olp M.D.   On: 04/14/2016 22:02    ASSESSMENT AND PLAN  1. Acute on chronic diastolic HF  - still massively fluid overloaded. HF service plan to check CVP and Co-ox via PICC line. Will continue IV lasix at this time. Weight not accurate, down >20 lbs in last 24 hours. HF service will manage  2. Severe mitral regurg with infective endocarditis  - initially admitted in 01/2016 with splenic infarct, placed on Xarelto. Later came back with embolic CVA, confirmed mitral valve vegetation. Seen by Dr. Roxy Manns - felt to be a poor candidate for surgery in her current state. Neurology recommend delay surgery until at least Sept (6 months following CVA/SAH)  - IV rocephin. Per Dr. Roxy Manns, will optimize her volume status, later may consider L&RHC and dental consult before high risk surgery.   - last echo 03/31/2016 normal EF, severe MR with highly mobile echodensity attached to the posterior mitral valve leaflet measuring 69mm in diameter, when compare to prior study from 3/21, there might be slight increase in size.  - question if we should get ID on board again since she is in the hospital and her last Echo shows enlarging mitral valve vegetation, unclear if getting a repeat limited echo +/- difinity will help  3. 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 HTN  5. HLD  6. DM II  7. Anxiety: will resume home Klonipin, patient wish to have 5 times daily klonipin which is too high of a dose, she still is extremely anxious although she does have a good reason to be given her current condition. She says she cannot take Xanax  8. Leg pain: appears to have some degree of cellulitis, since pain is occuring on both side, doubt emboli, no discoloration of toes on exam. Difficult to assess pulse due to significant LE edema.   Signed, Almyra Deforest PA-C Pager: [phone]  Agree with note by Almyra Deforest  PA-C  Diastolic HF as well as severe MR. Massively vol overloaded. Anasarca. VSS. NSR. I/O neg. Dr. Roxy Manns aware. HF service to assist in care in AM. SBE on ATBX with MV vegetation.   Lorretta Harp, M.D., [place], [facility], [place] [facility] [address] [place], [place]  [address]  [phone] 04/16/2016 2:03 PM

## 2016-04-16 NOTE — Evaluation (Signed)
Occupational Therapy Evaluation Patient Details Name: Margaret [name] MRN: [id number] DOB: [birth year] Today's Date: 04/16/2016    History of Present Illness Margaret [name] is a 60 y.o. female admitted with acute  CHF exacerbation.  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   Clinical Impression   Pt admitted with above. She demonstrates the below listed deficits and will benefit from continued OT to maximize safety and independence with BADLs.  Pt presents to OT with Lt hemiplegia, visual deficits, cognitive deficits and impaired balance.  She requires max A  Overall for ADLs.  Recommend SNF       Follow Up Recommendations  SNF    Equipment Recommendations  None recommended by OT    Recommendations for Other Services       Precautions / Restrictions Precautions Precautions: Fall Precaution Comments: L hemiparesis      Mobility Bed Mobility                  Transfers                      Balance     Sitting balance-Leahy Scale: Fair                                      ADL Overall ADL's : Needs assistance/impaired Eating/Feeding: Set up;Sitting   Grooming: Wash/dry hands;Wash/dry face;Oral care;Brushing hair;Sitting;Minimal assistance   Upper Body Bathing: Moderate assistance;Sitting   Lower Body Bathing: Maximal assistance;Bed level;+2 for physical assistance;Sit to/from stand   Upper Body Dressing : Maximal assistance;Sitting   Lower Body Dressing: Total assistance;Sit to/from stand   Toilet Transfer: Total assistance Toilet Transfer Details (indicate cue type and reason): Pt too fatigued to attempt  Toileting- Clothing Manipulation and Hygiene: Total assistance       Functional mobility during ADLs:  Maximal assistance;+2 for physical assistance       Vision Additional Comments: Per previous notes from prior admission.  Pt with Lt inattention post CVA    Perception Perception Comments: Pt attends to Lt without cues in minimal distracting environment    Praxis Praxis Praxis tested?: Within functional limits    Pertinent Vitals/Pain Pain Assessment: No/denies pain     Hand Dominance Right   Extremity/Trunk Assessment Upper Extremity Assessment Upper Extremity Assessment: LUE deficits/detail LUE Deficits / Details: Pt demonstrates minimal AAROM shoulder flexion with facilitation, minimal finger flexion.  Pt with minimally tight finger flexors  LUE Coordination: decreased fine motor;decreased gross motor   Lower Extremity Assessment Lower Extremity Assessment: Defer to PT evaluation   Cervical / Trunk Assessment Cervical / Trunk Exceptions: rotated trunk due to L hemiparesis   Communication Communication Communication: Expressive difficulties   Cognition Arousal/Alertness: Awake/alert Behavior During Therapy: Anxious Overall Cognitive Status: History of cognitive impairments - at baseline                     General Comments       Exercises Exercises: Other exercises Other Exercises Other Exercises: Worked on facilitation of Lt UE - shoulder flexion/extension, elbow flex/ext and mass grasp.  Minimal movement noted.  She was instructed in self ROM.  She fatigued and unable to continue  Shoulder Instructions      Home Living Family/patient expects to be discharged to:: Skilled nursing facility                                        Prior Functioning/Environment Level of Independence: Needs assistance  Gait / Transfers Assistance Needed: at SNF ambulating a little in parallel bars, assist for transfers and gets around in w/c ADL's / Homemaking Assistance Needed: Pt requires assist for ADLs         OT Diagnosis: Generalized  weakness;Cognitive deficits;Disturbance of vision;Hemiplegia non-dominant side   OT Problem List: Decreased strength;Decreased range of motion;Decreased activity tolerance;Impaired balance (sitting and/or standing);Impaired vision/perception;Decreased coordination;Decreased safety awareness;Decreased cognition;Decreased knowledge of use of DME or AE;Decreased knowledge of precautions;Impaired UE functional use;Cardiopulmonary status limiting activity;Impaired sensation;Obesity   OT Treatment/Interventions: Self-care/ADL training;Neuromuscular education;DME and/or AE instruction;Therapeutic activities;Energy conservation;Cognitive remediation/compensation;Visual/perceptual remediation/compensation;Patient/family education;Balance training    OT Goals(Current goals can be found in the care plan section) Acute Rehab OT Goals Patient Stated Goal: to regain independence  OT Goal Formulation: With patient Time For Goal Achievement: 04/30/16 Potential to Achieve Goals: Good ADL Goals Pt Will Perform Upper Body Bathing: with min assist;sitting Pt Will Transfer to Toilet: with mod assist;squat pivot transfer;bedside commode Additional ADL Goal #1: Pt will independently perform self ROM   OT Frequency: Min 2X/week   Barriers to D/C: Decreased caregiver support          Co-evaluation              End of Session Equipment Utilized During Treatment: Oxygen Nurse Communication: Mobility status  Activity Tolerance: Patient limited by fatigue Patient left: in chair;with call bell/phone within reach;with chair alarm set   Time: 2956-2130 OT Time Calculation (min): 18 min Charges:  OT General Charges $OT Visit: 1 Procedure OT Evaluation $OT Eval Moderate Complexity: 1 Procedure G-Codes:    Margaret [name] M 04-18-2016, 4:11 PM

## 2016-04-16 NOTE — Clinical Documentation Improvement (Signed)
Cardiology  Please document query responses in the progress notes and discharge summary, not on the CDI BPA form in [facility]. Please do not deactivate queries without responding to them.  Possible Clinical Conditions:  - Acute Hypoxic Respiratory Failure, improved  - Other condition  - Unable to clinically determine  Clinical Information: Patient described in H&P as "gasping for breath on oxygen", "very uncomfortable with shortness of breath in upright position", "she has crackles up to half of her lungs...". Patient has required up to 4 liters of oxygen to maintain 02 sats per flowsheets in [facility] Respiratory Rate in the mid 20's to low 30's per flowsheets in [facility] 02 sat per EMS records - less than 90% on 2 liters of oxygen.  Please exercise your independent, professional judgment when responding. A specific answer is not anticipated or expected.   Thank You, Erling Conte  RN BSN CCDS [phone] Health Information Management [place]

## 2016-04-16 NOTE — Consult Note (Signed)
Margaret [name]   Reason for [name]: Excessive anxiety, depression and endocarditis of mitral valve. Referring physician: Dr. Meda Coffee  Patient Identification: Margaret [name] MRN:  [id number] Principal Diagnosis: Other depression due to general medical condition Diagnosis:   Patient Active Problem List   Diagnosis Date Noted  . 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 Acute on chronic diastolic congestive heart failure due to valvular disease ([place]) [I50.33, I38] 04/14/2016  . CHF due to valvular disease, acute on chronic, diastolic [B09.62, E36] 62/94/7654  . Chronic diastolic congestive heart failure due to valvular disease ([place]) [I50.32, I38] 04/13/2016  . History of stroke [Z86.73] 04/13/2016  . Cerebrovascular accident (CVA) due to occlusion of right carotid artery ([place]) [I63.231] 03/25/2016  . Splenic infarct [D73.5] 03/25/2016  . Type 2 diabetes mellitus with circulatory disorder ([place]) [E11.59] 03/25/2016  . Mitral regurgitation [I34.0] 03/06/2016  . HLD (hyperlipidemia) [E78.5] 03/06/2016  . Right carotid artery occlusion [I65.21]   . Epigastric abdominal tenderness on direct palpation [R10.816]   . 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 Positive ANA (antinuclear antibody) [R76.8]   . Streptococcus viridans infection [B95.4] 02/04/2016  . Cerebrovascular accident (CVA) due to embolism of cerebral artery ([place]) [I63.40]   . Subarachnoid hemorrhage ([place]) [I60.9] 02/01/2016  . Stroke [facility]) [I63.9]   . Visual changes [H53.9]   . Pulmonary infiltrates [R91.8] 01/23/2016  . Pulmonary hypertension assoc with unclear multi-factorial mechanisms ([place]) [I27.2] 01/23/2016  . Splenic infarction [D73.5] 01/14/2016  . Insulin dependent diabetes mellitus ([place]) [E11.9, Z79.4] 01/14/2016  . Essential hypertension [I10] 01/14/2016  . Hepatic cirrhosis ([place]) [K74.60] 01/14/2016  . COPD (chronic obstructive pulmonary disease) ([place]) [J44.9]  04/27/2012  . Chronic diastolic CHF (congestive heart failure) ([place]) [I50.32] 04/27/2012  . Morbid obesity ([place]) [E66.01] 04/27/2012  . Hypoxia [R09.02] 04/27/2012  . Dyspnea on exertion [R06.09] 04/27/2012  . Smoker [Z72.0] 04/27/2012    Total Time spent with patient: 1 hour  Subjective:   Margaret [name] is a 60 y.o. female patient admitted with endocarditis and depression.  HPI:  Margaret [name] is a 60 y.o. female seen, chart reviewed and case discussed with staff RN, Dr. Meda Coffee, her sister and brother who are at bed side. Patient endorses significant panic attacks in middle of night waking up with SOB, chest pain, increased heart rate, startle, and confusion. Her symptoms get worse when no one to help her with medication and calm down her. She has called her sister few times from [place] with excessive anxiety and screaming and yelling which she states has no control on herself. She does not want to take hydrocodone and xanax due to excessive sedation and felt she has no control on her won self. She is satisfied with her clonazepam and willing to give a trail of SSRI  Lexapro and Neurontin for additional help. She is hoping get physically strong with diuresis therapy, cardiac surgery and renovation of her home. She was working until her stroke few months ago i.e 02/07/2016. She denied family history of mental illness or drug abuse. She has no suicide or homicide ideation, intention or plans.   Past Psychiatric History: None reported until her first CVA.   Risk to Self: Is patient at risk for suicide?: No Risk to Others:   Prior Inpatient Therapy:   Prior Outpatient Therapy:    Past Medical History:  Past Medical History  Diagnosis Date  . Hypertension   . Hyperlipidemia   . COPD (chronic obstructive  pulmonary disease) ([place]) 04/20/12  . Asthma   . Type II diabetes mellitus ([place])   . Anemia   . H/O hiatal hernia   . Osteoarthritis   . Pneumonia 04/2012  . Migraines   .  Panic attacks 04/20/12  . Morbid obesity ([place])   . Heart murmur   . Bacterial endocarditis     Strep viridans   . Chronic diastolic CHF (congestive heart failure) ([place]) 04/27/2012  . Pulmonary hypertension assoc with unclear multi-factorial mechanisms ([place]) 01/23/2016  . Positive ANA (antinuclear antibody)   . 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 Stroke ([place])   . Subarachnoid bleed ([place]) 02/01/2016    Past Surgical History  Procedure Laterality Date  . Laceration repair  1966    "right upper arm"  . Dilation and curettage of uterus  1990's  . Finger reattachment  1973    "right ring finger"  . Tee without cardioversion N/A 02/05/2016    Procedure: TRANSESOPHAGEAL ECHOCARDIOGRAM (TEE);  Surgeon: Larey Dresser, MD;  Location: [facility];  Service: Cardiovascular;  Laterality: N/A;  . Radiology with anesthesia N/A 02/06/2016    Procedure: RADIOLOGY WITH ANESTHESIA;  Surgeon: Luanne Bras, MD;  Location: [facility];  Service: Radiology;  Laterality: N/A;   Family History:  Family History  Problem Relation Age of Onset  . Allergies    . Hypertension    . Coronary artery disease Mother 75  . Stroke Father   . COPD Father   . Clotting disorder Maternal Grandmother     died from blood clot   Family Psychiatric  History: none reported. Social History:  History  Alcohol Use No     History  Drug Use No    Social History   Social History  . Marital Status: Married    Spouse Name: N/A  . Number of Children: N/A  . Years of Education: N/A   Social History Main Topics  . Smoking status: Former Smoker -- 0.20 packs/day for 40 years    Types: Cigarettes    Quit date: 04/20/2012  . Smokeless tobacco: Never Used     Comment: 6/5//13 "a pack of cigarettes would last me 1 - 1 1/2 wk"  . Alcohol Use: No  . Drug Use: No  . Sexual Activity: Not Currently     Comment: married, 2 children, work with computers   Other Topics Concern  . None   Social History Narrative   Additional  Social History:    Allergies:   Allergies  Allergen Reactions  . Codeine Hives, Itching and Other (See Comments)    "breathing problems"  . Latex Itching    Labs:  Results for orders placed or performed during the hospital encounter of 04/14/16 (from the past 48 hour(s))  Glucose, capillary     Status: Abnormal   Collection Time: 04/14/16  6:06 PM  Result Value Ref Range   Glucose-Capillary 128 (H) 65 - 99 mg/dL   Comment 1 Capillary Specimen   Comprehensive metabolic panel     Status: Abnormal   Collection Time: 04/14/16  6:40 PM  Result Value Ref Range   Sodium 138 135 - 145 mmol/L   Potassium 4.3 3.5 - 5.1 mmol/L   Chloride 98 (L) 101 - 111 mmol/L   CO2 31 22 - 32 mmol/L   Glucose, Bld 122 (H) 65 - 99 mg/dL   BUN 8 6 - 20 mg/dL   Creatinine, Ser 0.82 0.44 - 1.00 mg/dL   Calcium 10.7 (H) 8.9 -  10.3 mg/dL   Total Protein 7.3 6.5 - 8.1 g/dL   Albumin 3.0 (L) 3.5 - 5.0 g/dL   AST 20 15 - 41 U/L   ALT 14 14 - 54 U/L   Alkaline Phosphatase 75 38 - 126 U/L   Total Bilirubin 0.3 0.3 - 1.2 mg/dL   GFR calc non Af Amer >60 >60 mL/min   GFR calc Af Amer >60 >60 mL/min    Comment: (NOTE) The eGFR has been calculated using the CKD EPI equation. This calculation has not been validated in all clinical situations. eGFR's persistently <60 mL/min signify possible Chronic Kidney Disease.    Anion gap 9 5 - 15  APTT     Status: None   Collection Time: 04/14/16  6:40 PM  Result Value Ref Range   aPTT 30 24 - 37 seconds  Protime-INR     Status: None   Collection Time: 04/14/16  6:40 PM  Result Value Ref Range   Prothrombin Time 14.6 11.6 - 15.2 seconds   INR 1.12 0.00 - 1.49  CBC WITH DIFFERENTIAL     Status: Abnormal   Collection Time: 04/14/16  6:40 PM  Result Value Ref Range   WBC 13.4 (H) 4.0 - 10.5 K/uL   RBC 4.36 3.87 - 5.11 MIL/uL   Hemoglobin 11.4 (L) 12.0 - 15.0 g/dL   HCT 39.0 36.0 - 46.0 %   MCV 89.4 78.0 - 100.0 fL   MCH 26.1 26.0 - 34.0 pg   MCHC 29.2 (L) 30.0  - 36.0 g/dL   RDW 16.1 (H) 11.5 - 15.5 %   Platelets 596 (H) 150 - 400 K/uL   Neutrophils Relative % 78 %   Neutro Abs 10.4 (H) 1.7 - 7.7 K/uL   Lymphocytes Relative 11 %   Lymphs Abs 1.5 0.7 - 4.0 K/uL   Monocytes Relative 6 %   Monocytes Absolute 0.8 0.1 - 1.0 K/uL   Eosinophils Relative 5 %   Eosinophils Absolute 0.7 0.0 - 0.7 K/uL   Basophils Relative 0 %   Basophils Absolute 0.1 0.0 - 0.1 K/uL  MRSA PCR Screening     Status: None   Collection Time: 04/14/16  6:41 PM  Result Value Ref Range   MRSA by PCR NEGATIVE NEGATIVE    Comment:        The GeneXpert MRSA Assay (FDA approved for NASAL specimens only), is one component of a comprehensive MRSA colonization surveillance program. It is not intended to diagnose MRSA infection nor to guide or monitor treatment for MRSA infections.   Glucose, capillary     Status: None   Collection Time: 04/14/16  9:31 PM  Result Value Ref Range   Glucose-Capillary 85 65 - 99 mg/dL   Comment 1 Capillary Specimen   Basic metabolic panel     Status: Abnormal   Collection Time: 04/15/16  3:20 AM  Result Value Ref Range   Sodium 137 135 - 145 mmol/L   Potassium 4.0 3.5 - 5.1 mmol/L   Chloride 93 (L) 101 - 111 mmol/L   CO2 36 (H) 22 - 32 mmol/L   Glucose, Bld 118 (H) 65 - 99 mg/dL   BUN 8 6 - 20 mg/dL   Creatinine, Ser 0.72 0.44 - 1.00 mg/dL   Calcium 10.7 (H) 8.9 - 10.3 mg/dL   GFR calc non Af Amer >60 >60 mL/min   GFR calc Af Amer >60 >60 mL/min    Comment: (NOTE) The eGFR has been calculated using the  CKD EPI equation. This calculation has not been validated in all clinical situations. eGFR's persistently <60 mL/min signify possible Chronic Kidney Disease.    Anion gap 8 5 - 15  Magnesium     Status: None   Collection Time: 04/15/16  3:20 AM  Result Value Ref Range   Magnesium 2.2 1.7 - 2.4 mg/dL  Glucose, capillary     Status: Abnormal   Collection Time: 04/15/16  8:47 AM  Result Value Ref Range   Glucose-Capillary 139 (H)  65 - 99 mg/dL   Comment 1 Notify RN   Glucose, capillary     Status: Abnormal   Collection Time: 04/15/16 12:54 PM  Result Value Ref Range   Glucose-Capillary 105 (H) 65 - 99 mg/dL   Comment 1 Notify RN   Glucose, capillary     Status: Abnormal   Collection Time: 04/15/16  4:40 PM  Result Value Ref Range   Glucose-Capillary 125 (H) 65 - 99 mg/dL   Comment 1 Notify RN   Glucose, capillary     Status: Abnormal   Collection Time: 04/15/16  9:28 PM  Result Value Ref Range   Glucose-Capillary 103 (H) 65 - 99 mg/dL   Comment 1 Capillary Specimen   Urinalysis, Routine w reflex microscopic (not at [facility])     Status: Abnormal   Collection Time: 04/16/16  1:17 AM  Result Value Ref Range   Color, Urine AMBER (A) YELLOW    Comment: BIOCHEMICALS MAY BE AFFECTED BY COLOR   APPearance CLOUDY (A) CLEAR   Specific Gravity, Urine 1.017 1.005 - 1.030   pH 5.5 5.0 - 8.0   Glucose, UA NEGATIVE NEGATIVE mg/dL   Hgb urine dipstick LARGE (A) NEGATIVE   Bilirubin Urine NEGATIVE NEGATIVE   Ketones, ur NEGATIVE NEGATIVE mg/dL   Protein, ur 30 (A) NEGATIVE mg/dL   Nitrite NEGATIVE NEGATIVE   Leukocytes, UA MODERATE (A) NEGATIVE  Urine microscopic-add on     Status: Abnormal   Collection Time: 04/16/16  1:17 AM  Result Value Ref Range   Squamous Epithelial / LPF 0-5 (A) NONE SEEN   WBC, UA 0-5 0 - 5 WBC/hpf   RBC / HPF TOO NUMEROUS TO COUNT 0 - 5 RBC/hpf   Bacteria, UA RARE (A) NONE SEEN   Casts HYALINE CASTS (A) NEGATIVE  Basic metabolic panel     Status: Abnormal   Collection Time: 04/16/16  3:29 AM  Result Value Ref Range   Sodium 135 135 - 145 mmol/L   Potassium 4.1 3.5 - 5.1 mmol/L   Chloride 89 (L) 101 - 111 mmol/L   CO2 36 (H) 22 - 32 mmol/L   Glucose, Bld 95 65 - 99 mg/dL   BUN 16 6 - 20 mg/dL   Creatinine, Ser 0.87 0.44 - 1.00 mg/dL   Calcium 10.9 (H) 8.9 - 10.3 mg/dL   GFR calc non Af Amer >60 >60 mL/min   GFR calc Af Amer >60 >60 mL/min    Comment: (NOTE) The eGFR has been  calculated using the CKD EPI equation. This calculation has not been validated in all clinical situations. eGFR's persistently <60 mL/min signify possible Chronic Kidney Disease.    Anion gap 10 5 - 15  Glucose, capillary     Status: None   Collection Time: 04/16/16  7:48 AM  Result Value Ref Range   Glucose-Capillary 98 65 - 99 mg/dL   Comment 1 Capillary Specimen   Glucose, capillary     Status: None  Collection Time: 04/16/16 12:09 PM  Result Value Ref Range   Glucose-Capillary 88 65 - 99 mg/dL   Comment 1 Capillary Specimen   Carboxyhemoglobin     Status: Abnormal   Collection Time: 04/16/16  2:30 PM  Result Value Ref Range   Total hemoglobin 11.8 (L) 12.0 - 16.0 g/dL   O2 Saturation 75.7 %   Carboxyhemoglobin 1.4 0.5 - 1.5 %   Methemoglobin 0.4 0.0 - 1.5 %    Current Facility-Administered Medications  Medication Dose Route Frequency Provider Last Rate Last Dose  . 0.9 %  sodium chloride infusion  250 mL Intravenous PRN Liliane Shi, PA-C      . acetaminophen (TYLENOL) tablet 650 mg  650 mg Oral Q4H PRN Liliane Shi, PA-C      . albuterol (PROVENTIL) (2.5 MG/3ML) 0.083% nebulizer solution 2.5 mg  2.5 mg Inhalation Q4H PRN Liliane Shi, PA-C      . albuterol (PROVENTIL) (2.5 MG/3ML) 0.083% nebulizer solution 2.5 mg  2.5 mg Nebulization QHS Liliane Shi, PA-C   2.5 mg at 04/15/16 1948  . antiseptic oral rinse (CPC / CETYLPYRIDINIUM CHLORIDE 0.05%) solution 7 mL  7 mL Mouth Rinse BID Dorothy Spark, MD   7 mL at 04/16/16 1035  . aspirin chewable tablet 81 mg  81 mg Oral Daily Liliane Shi, PA-C   81 mg at 04/16/16 1035  . atenolol (TENORMIN) tablet 25 mg  25 mg Oral BID Liliane Shi, PA-C   25 mg at 04/16/16 1034  . cefTRIAXone (ROCEPHIN) 2 g in dextrose 5 % 50 mL IVPB  2 g Intravenous Q24H Dorothy Spark, MD   2 g at 04/15/16 2230  . cholecalciferol (VITAMIN D) tablet 1,000 Units  1,000 Units Oral BID Liliane Shi, PA-C   1,000 Units at 04/16/16 1035  .  clonazePAM (KLONOPIN) tablet 0.25 mg  0.25 mg Oral Daily PRN Dorothy Spark, MD      . clonazePAM [facility] [place]) tablet 0.25 mg  0.25 mg Oral Daily Dorothy Spark, MD   0.25 mg at 04/16/16 1035  . clonazePAM (KLONOPIN) tablet 0.5 mg  0.5 mg Oral QHS Dorothy Spark, MD      . enoxaparin (LOVENOX) injection 40 mg  40 mg Subcutaneous Q24H Liliane Shi, PA-C   40 mg at 04/15/16 1855  . feeding supplement (ENSURE ENLIVE) (ENSURE ENLIVE) liquid 237 mL  237 mL Oral BID BM Dorothy Spark, MD   237 mL at 04/16/16 1036  . furosemide (LASIX) injection 80 mg  80 mg Intravenous BID Rogelia Mire, NP   80 mg at 04/16/16 0800  . insulin aspart (novoLOG) injection 0-20 Units  0-20 Units Subcutaneous TID WC Liliane Shi, PA-C   3 Units at 04/15/16 1708  . insulin aspart (novoLOG) injection 0-5 Units  0-5 Units Subcutaneous QHS Liliane Shi, PA-C   0 Units at 04/14/16 2200  . insulin aspart protamine- aspart (NOVOLOG MIX 70/30) injection 10 Units  10 Units Subcutaneous BID WC Liliane Shi, PA-C   10 Units at 04/16/16 [phone]  . linagliptin (TRADJENTA) tablet 5 mg  5 mg Oral Daily Liliane Shi, PA-C   5 mg at 04/16/16 1034  . metFORMIN (GLUCOPHAGE) tablet 1,000 mg  1,000 mg Oral BID WC Liliane Shi, PA-C   1,000 mg at 04/16/16 0800  . nitroGLYCERIN (NITROSTAT) SL tablet 0.4 mg  0.4 mg Sublingual Q5 Min x 3 PRN Scott T  Weaver, PA-C      . ondansetron [facility]) injection 4 mg  4 mg Intravenous Q6H PRN Liliane Shi, PA-C      . polyethylene glycol (MIRALAX / GLYCOLAX) packet 17 g  17 g Oral Daily Liliane Shi, PA-C   17 g at 04/16/16 1032  . potassium chloride SA (K-DUR,KLOR-CON) CR tablet 20 mEq  20 mEq Oral BID Liliane Shi, PA-C   20 mEq at 04/16/16 1034  . pravastatin (PRAVACHOL) tablet 10 mg  10 mg Oral q1800 Liliane Shi, PA-C   10 mg at 04/15/16 1843  . senna (SENOKOT) tablet 8.6 mg  1 tablet Oral BID PRN Liliane Shi, PA-C      . sodium chloride (OCEAN) 0.65 % nasal spray 1 spray   1 spray Each Nare PRN Rogelia Mire, NP      . sodium chloride flush (NS) 0.9 % injection 10-40 mL  10-40 mL Intracatheter PRN Dorothy Spark, MD   10 mL at 04/15/16 1013  . sodium chloride flush (NS) 0.9 % injection 3 mL  3 mL Intravenous Q12H Liliane Shi, PA-C   3 mL at 04/16/16 1036  . sodium chloride flush (NS) 0.9 % injection 3 mL  3 mL Intravenous PRN Liliane Shi, PA-C      . spironolactone (ALDACTONE) tablet 50 mg  50 mg Oral Daily Liliane Shi, PA-C   50 mg at 04/16/16 1035  . traMADol (ULTRAM) tablet 50 mg  50 mg Oral Q12H PRN Liliane Shi, PA-C   50 mg at 04/16/16 1856    Musculoskeletal: Strength & Muscle Tone: decreased Gait & Station: unable to stand Patient leans: N/A  Psychiatric Specialty Exam: Physical Exam as per history and physical  ROS Excessive anxiety, SOB and fluids on her legs No Fever-chills, No Headache, No changes with Vision or hearing, reports vertigo No problems swallowing food or Liquids, No Chest pain, Cough or Shortness of Breath, No Abdominal pain, No Nausea or Vommitting, Bowel movements are regular, No Blood in stool or Urine, No dysuria, No new skin rashes or bruises, No new joints pains-aches,  No new weakness, tingling, numbness in any extremity, No recent weight gain or loss, No polyuria, polydypsia or polyphagia,  A full 10 point Review of Systems was done, except as stated above, all other Review of Systems were negative.  Blood pressure 123/68, pulse 64, temperature 97.6 F (36.4 C), temperature source Oral, resp. rate 18, height _0  (1.651 m), weight 101.4 kg (223 lb 8.7 oz), SpO2 96 %.Body mass index is 37.2 kg/(m^2).  General Appearance: Guarded  Eye Contact:  Good  Speech:  Clear and Coherent  Volume:  Decreased  Mood:  Anxious and Depressed  Affect:  Appropriate and Congruent  Thought Process:  Coherent  Orientation:  Full (Time, Place, and Person)  Thought Content:  WDL  Suicidal Thoughts:  No  Homicidal  Thoughts:  No  Memory:  Immediate;   Good Recent;   Fair Remote;   Fair  Judgement:  Intact  Insight:  Fair  Psychomotor Activity:  Decreased  Concentration:  Concentration: Fair  Recall:  [place] of Knowledge:  Good  Language:  Fair  Akathisia:  Negative  Handed:  Right  AIMS (if indicated):     Assets:  Communication Skills Desire for Improvement Financial Resources/Insurance Housing Intimacy Leisure Time Resilience Social Support Transportation  ADL's:  Impaired  Cognition:  WNL  Sleep:  Treatment Plan Summary: Daily contact with patient to assess and evaluate symptoms and progress in treatment and Medication management  Change klonopin 0.5 mg PO BID  Start Klonopin 25 mg TID/PRN for anxiety and avoid sedated and SOB more than normal for her Start Lexapro 5 mg PO QHS for depression and generalized anxiety Start Gabapentin 100 mg PO BID for excessive anxiety Appreciate psychiatric consultation and follow up as clinically required Please contact [phone] or [phone] if needs further assistance  Disposition: Patient does not meet criteria for psychiatric inpatient admission. Supportive therapy provided about ongoing stressors.  Ambrose Finland, MD 04/16/2016 4:09 PM

## 2016-04-16 NOTE — Progress Notes (Signed)
Patient became increasingly anxious when husband left. Hx of anxiety and panic attacks. Current orders for clonazepam 0.5mg  prn at bedtime and 0.25mg  prn tid. Notified MD of intermittent heightened anxiety and maintained moderate levels of anxiety. Physician does not want to add additional medications at this time, but increase 0.25mg  dose to 0.5mg  if needed.

## 2016-04-16 NOTE — Progress Notes (Signed)
Chaplain was called to support Pt. Who was going through  Major life transition. Chaplain helped Pt. Identify relationships of care and support and chaplain helped Pt. Evaluate coping strategies.   Advance Directive paperwork was also given.

## 2016-04-17 ENCOUNTER — Inpatient Hospital Stay (HOSPITAL_COMMUNITY): Payer: Medicaid Other

## 2016-04-17 DIAGNOSIS — F329 Major depressive disorder, single episode, unspecified: Secondary | ICD-10-CM

## 2016-04-17 DIAGNOSIS — K08409 Partial loss of teeth, unspecified cause, unspecified class: Secondary | ICD-10-CM

## 2016-04-17 DIAGNOSIS — K036 Deposits [accretions] on teeth: Secondary | ICD-10-CM

## 2016-04-17 DIAGNOSIS — E119 Type 2 diabetes mellitus without complications: Secondary | ICD-10-CM

## 2016-04-17 DIAGNOSIS — K0530 Chronic periodontitis, unspecified: Secondary | ICD-10-CM

## 2016-04-17 LAB — GLUCOSE, CAPILLARY
Glucose-Capillary: 118 mg/dL — ABNORMAL HIGH (ref 65–99)
Glucose-Capillary: 72 mg/dL (ref 65–99)
Glucose-Capillary: 73 mg/dL (ref 65–99)
Glucose-Capillary: 116 mg/dL — ABNORMAL HIGH (ref 65–99)
Glucose-Capillary: 124 mg/dL — ABNORMAL HIGH (ref 65–99)

## 2016-04-17 LAB — BASIC METABOLIC PANEL
ANION GAP: 7 (ref 5–15)
BUN: 18 mg/dL (ref 6–20)
CALCIUM: 10.8 mg/dL — AB (ref 8.9–10.3)
CO2: 40 mmol/L — ABNORMAL HIGH (ref 22–32)
Chloride: 89 mmol/L — ABNORMAL LOW (ref 101–111)
Creatinine, Ser: 0.81 mg/dL (ref 0.44–1.00)
GLUCOSE: 116 mg/dL — AB (ref 65–99)
POTASSIUM: 4.1 mmol/L (ref 3.5–5.1)
SODIUM: 136 mmol/L (ref 135–145)

## 2016-04-17 LAB — URINE CULTURE: CULTURE: NO GROWTH

## 2016-04-17 LAB — CARBOXYHEMOGLOBIN
Carboxyhemoglobin: 1.5 % (ref 0.5–1.5)
Methemoglobin: 0.5 % (ref 0.0–1.5)
O2 Saturation: 68.4 %
Total hemoglobin: 11.7 g/dL — ABNORMAL LOW (ref 12.0–16.0)

## 2016-04-17 NOTE — Progress Notes (Signed)
Hypoglycemic Event  CBG: 72  Treatment: 15 GM carbohydrate snack  Symptoms: None  Follow-up CBG: Time:12:54 CBG Result:73  Possible Reasons for Event: Inadequate meal intake  Comments/MD notified:[place] notified at 12:55   Pt given lunch at 01:03 pm.   Margaret [name]

## 2016-04-17 NOTE — Consult Note (Signed)
[facility] Face-to-Face Psychiatry Consult Follow up  Reason for Consult: Excessive anxiety, depression and endocarditis of mitral valve. Referring physician: Dr. Meda Coffee  Patient Identification: Margaret [name] MRN:  [id number] Principal Diagnosis: Other depression due to general medical condition Diagnosis:   Patient Active Problem List   Diagnosis Date Noted  . 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 Panic disorder without agoraphobia with severe panic attacks [F41.0]   . Acute on chronic diastolic congestive heart failure due to valvular disease ([place]) [I50.33, I38] 04/14/2016  . CHF due to valvular disease, acute on chronic, diastolic [M01.02, V25] 36/64/4034  . Chronic diastolic congestive heart failure due to valvular disease ([place]) [I50.32, I38] 04/13/2016  . History of stroke [Z86.73] 04/13/2016  . Cerebrovascular accident (CVA) due to occlusion of right carotid artery ([place]) [I63.231] 03/25/2016  . Splenic infarct [D73.5] 03/25/2016  . Type 2 diabetes mellitus with circulatory disorder ([place]) [E11.59] 03/25/2016  . Mitral regurgitation [I34.0] 03/06/2016  . HLD (hyperlipidemia) [E78.5] 03/06/2016  . Right carotid artery occlusion [I65.21]   . Epigastric abdominal tenderness on direct palpation [R10.816]   . 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 Positive ANA (antinuclear antibody) [R76.8]   . Streptococcus viridans infection [B95.4] 02/04/2016  . Cerebrovascular accident (CVA) due to embolism of cerebral artery ([place]) [I63.40]   . Subarachnoid hemorrhage ([place]) [I60.9] 02/01/2016  . Stroke [facility]) [I63.9]   . Visual changes [H53.9]   . Pulmonary infiltrates [R91.8] 01/23/2016  . Pulmonary hypertension assoc with unclear multi-factorial mechanisms ([place]) [I27.2] 01/23/2016  . Splenic infarction [D73.5] 01/14/2016  . Insulin dependent diabetes mellitus ([place]) [E11.9, Z79.4] 01/14/2016  . Essential hypertension [I10] 01/14/2016  . Hepatic cirrhosis ([place])  [K74.60] 01/14/2016  . COPD (chronic obstructive pulmonary disease) ([place]) [J44.9] 04/27/2012  . Chronic diastolic CHF (congestive heart failure) ([place]) [I50.32] 04/27/2012  . Morbid obesity ([place]) [E66.01] 04/27/2012  . Hypoxia [R09.02] 04/27/2012  . Dyspnea on exertion [R06.09] 04/27/2012  . Smoker [Z72.0] 04/27/2012    Total Time spent with patient: 30 minutes  Subjective:   Margaret [name] is a 60 y.o. female patient admitted with endocarditis and depression.  HPI:  Margaret [name] is a 60 y.o. female seen, chart reviewed and case discussed with staff RN, Dr. Meda Coffee, patient sister and brother who are at bed side. Patient endorses significant panic attacks in middle of night waking up with SOB, chest pain, increased heart rate, startle, and confusion. Her symptoms get worse when no one to help her with medication and calm down her. She has called her sister few times from [place] with excessive anxiety and screaming and yelling which she states has no control on herself. She does not want to take hydrocodone and xanax due to excessive sedation and felt she has no control on her won self. She is satisfied with her clonazepam and willing to give a trail of SSRI  Lexapro and Neurontin for additional help. She is hoping get physically strong with diuresis therapy, and has future plans of scheduled cardiac surgery and renovation of her home. She was working until her stroke few months ago i.e 02/07/2016. She denied family history of mental illness or drug abuse. She has no suicide or homicide ideation, intention or plans. Past Psychiatric History: None reported until her first CVA.   Interval history:Patient is seen today for psychiatric consultation follow-up. Patient appeared lying on her bed with her family members and also staff nurses working with her. Patient reportedly slept 5 hours last  night and feels more refreshed and relieved. Patient sister who is at bedside stated she does not  have any panic episodes since yesterday and does not appear to be distressed/ restless. Patient has been compliant with her medication as prescribed and has been tolerating well without adverse effects. Patient has no known safety concerns.  Risk to Self: Is patient at risk for suicide?: No Risk to Others:   Prior Inpatient Therapy:   Prior Outpatient Therapy:    Past Medical History:  Past Medical History  Diagnosis Date  . Hypertension   . Hyperlipidemia   . COPD (chronic obstructive pulmonary disease) ([place]) 04/20/12  . Asthma   . Type II diabetes mellitus ([place])   . Anemia   . H/O hiatal hernia   . Osteoarthritis   . Pneumonia 04/2012  . Migraines   . Panic attacks 04/20/12  . Morbid obesity ([place])   . Heart murmur   . Bacterial endocarditis     Strep viridans   . Chronic diastolic CHF (congestive heart failure) ([place]) 04/27/2012  . Pulmonary hypertension assoc with unclear multi-factorial mechanisms ([place]) 01/23/2016  . Positive ANA (antinuclear antibody)   . 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 Stroke ([place])   . Subarachnoid bleed ([place]) 02/01/2016    Past Surgical History  Procedure Laterality Date  . Laceration repair  1966    "right upper arm"  . Dilation and curettage of uterus  1990's  . Finger reattachment  1973    "right ring finger"  . Tee without cardioversion N/A 02/05/2016    Procedure: TRANSESOPHAGEAL ECHOCARDIOGRAM (TEE);  Surgeon: Larey Dresser, MD;  Location: [facility];  Service: Cardiovascular;  Laterality: N/A;  . Radiology with anesthesia N/A 02/06/2016    Procedure: RADIOLOGY WITH ANESTHESIA;  Surgeon: Luanne Bras, MD;  Location: [facility];  Service: Radiology;  Laterality: N/A;   Family History:  Family History  Problem Relation Age of Onset  . Allergies    . Hypertension    . Coronary artery disease Mother 63  . Stroke Father   . COPD Father   . Clotting disorder Maternal Grandmother     died from blood clot   Family Psychiatric  History: none  reported. Social History:  History  Alcohol Use No     History  Drug Use No    Social History   Social History  . Marital Status: Married    Spouse Name: N/A  . Number of Children: N/A  . Years of Education: N/A   Social History Main Topics  . Smoking status: Former Smoker -- 0.20 packs/day for 40 years    Types: Cigarettes    Quit date: 04/20/2012  . Smokeless tobacco: Never Used     Comment: 6/5//13 "a pack of cigarettes would last me 1 - 1 1/2 wk"  . Alcohol Use: No  . Drug Use: No  . Sexual Activity: Not Currently     Comment: married, 2 children, work with computers   Other Topics Concern  . None   Social History Narrative   Additional Social History:    Allergies:   Allergies  Allergen Reactions  . Codeine Hives, Itching and Other (See Comments)    "breathing problems"  . Latex Itching    Labs:  Results for orders placed or performed during the hospital encounter of 04/14/16 (from the past 48 hour(s))  Glucose, capillary     Status: Abnormal   Collection Time: 04/15/16 12:54 PM  Result Value Ref Range   Glucose-Capillary  105 (H) 65 - 99 mg/dL   Comment 1 Notify RN   Glucose, capillary     Status: Abnormal   Collection Time: 04/15/16  4:40 PM  Result Value Ref Range   Glucose-Capillary 125 (H) 65 - 99 mg/dL   Comment 1 Notify RN   Glucose, capillary     Status: Abnormal   Collection Time: 04/15/16  9:28 PM  Result Value Ref Range   Glucose-Capillary 103 (H) 65 - 99 mg/dL   Comment 1 Capillary Specimen   Urinalysis, Routine w reflex microscopic (not at [facility])     Status: Abnormal   Collection Time: 04/16/16  1:17 AM  Result Value Ref Range   Color, Urine AMBER (A) YELLOW    Comment: BIOCHEMICALS MAY BE AFFECTED BY COLOR   APPearance CLOUDY (A) CLEAR   Specific Gravity, Urine 1.017 1.005 - 1.030   pH 5.5 5.0 - 8.0   Glucose, UA NEGATIVE NEGATIVE mg/dL   Hgb urine dipstick LARGE (A) NEGATIVE   Bilirubin Urine NEGATIVE NEGATIVE   Ketones, ur  NEGATIVE NEGATIVE mg/dL   Protein, ur 30 (A) NEGATIVE mg/dL   Nitrite NEGATIVE NEGATIVE   Leukocytes, UA MODERATE (A) NEGATIVE  Urine microscopic-add on     Status: Abnormal   Collection Time: 04/16/16  1:17 AM  Result Value Ref Range   Squamous Epithelial / LPF 0-5 (A) NONE SEEN   WBC, UA 0-5 0 - 5 WBC/hpf   RBC / HPF TOO NUMEROUS TO COUNT 0 - 5 RBC/hpf   Bacteria, UA RARE (A) NONE SEEN   Casts HYALINE CASTS (A) NEGATIVE  Culture, Urine     Status: None   Collection Time: 04/16/16  1:18 AM  Result Value Ref Range   Specimen Description URINE, CATHETERIZED    Special Requests rocephin Immunocompromised    Culture NO GROWTH    Report Status 04/17/2016 FINAL   Basic metabolic panel     Status: Abnormal   Collection Time: 04/16/16  3:29 AM  Result Value Ref Range   Sodium 135 135 - 145 mmol/L   Potassium 4.1 3.5 - 5.1 mmol/L   Chloride 89 (L) 101 - 111 mmol/L   CO2 36 (H) 22 - 32 mmol/L   Glucose, Bld 95 65 - 99 mg/dL   BUN 16 6 - 20 mg/dL   Creatinine, Ser 0.87 0.44 - 1.00 mg/dL   Calcium 10.9 (H) 8.9 - 10.3 mg/dL   GFR calc non Af Amer >60 >60 mL/min   GFR calc Af Amer >60 >60 mL/min    Comment: (NOTE) The eGFR has been calculated using the CKD EPI equation. This calculation has not been validated in all clinical situations. eGFR's persistently <60 mL/min signify possible Chronic Kidney Disease.    Anion gap 10 5 - 15  Glucose, capillary     Status: None   Collection Time: 04/16/16  7:48 AM  Result Value Ref Range   Glucose-Capillary 98 65 - 99 mg/dL   Comment 1 Capillary Specimen   Glucose, capillary     Status: None   Collection Time: 04/16/16 12:09 PM  Result Value Ref Range   Glucose-Capillary 88 65 - 99 mg/dL   Comment 1 Capillary Specimen   Carboxyhemoglobin     Status: Abnormal   Collection Time: 04/16/16  2:30 PM  Result Value Ref Range   Total hemoglobin 11.8 (L) 12.0 - 16.0 g/dL   O2 Saturation 75.7 %   Carboxyhemoglobin 1.4 0.5 - 1.5 %   Methemoglobin  0.4 0.0 - 1.5 %  Glucose, capillary     Status: Abnormal   Collection Time: 04/16/16  5:17 PM  Result Value Ref Range   Glucose-Capillary 110 (H) 65 - 99 mg/dL   Comment 1 Capillary Specimen   Glucose, capillary     Status: None   Collection Time: 04/16/16  9:12 PM  Result Value Ref Range   Glucose-Capillary 81 65 - 99 mg/dL   Comment 1 Notify RN   Basic metabolic panel     Status: Abnormal   Collection Time: 04/17/16  3:25 AM  Result Value Ref Range   Sodium 136 135 - 145 mmol/L   Potassium 4.1 3.5 - 5.1 mmol/L   Chloride 89 (L) 101 - 111 mmol/L   CO2 40 (H) 22 - 32 mmol/L   Glucose, Bld 116 (H) 65 - 99 mg/dL   BUN 18 6 - 20 mg/dL   Creatinine, Ser 0.81 0.44 - 1.00 mg/dL   Calcium 10.8 (H) 8.9 - 10.3 mg/dL   GFR calc non Af Amer >60 >60 mL/min   GFR calc Af Amer >60 >60 mL/min    Comment: (NOTE) The eGFR has been calculated using the CKD EPI equation. This calculation has not been validated in all clinical situations. eGFR's persistently <60 mL/min signify possible Chronic Kidney Disease.    Anion gap 7 5 - 15  Carboxyhemoglobin     Status: Abnormal   Collection Time: 04/17/16  3:25 AM  Result Value Ref Range   Total hemoglobin 11.7 (L) 12.0 - 16.0 g/dL   O2 Saturation 68.4 %   Carboxyhemoglobin 1.5 0.5 - 1.5 %   Methemoglobin 0.5 0.0 - 1.5 %  Glucose, capillary     Status: Abnormal   Collection Time: 04/17/16  8:17 AM  Result Value Ref Range   Glucose-Capillary 118 (H) 65 - 99 mg/dL   Comment 1 Capillary Specimen     Current Facility-Administered Medications  Medication Dose Route Frequency Provider Last Rate Last Dose  . 0.9 %  sodium chloride infusion  250 mL Intravenous PRN Liliane Shi, PA-C      . acetaminophen (TYLENOL) tablet 650 mg  650 mg Oral Q4H PRN Liliane Shi, PA-C      . albuterol (PROVENTIL) (2.5 MG/3ML) 0.083% nebulizer solution 2.5 mg  2.5 mg Inhalation Q4H PRN Liliane Shi, PA-C      . albuterol (PROVENTIL) (2.5 MG/3ML) 0.083% nebulizer  solution 2.5 mg  2.5 mg Nebulization QHS Liliane Shi, PA-C   2.5 mg at 04/16/16 1947  . antiseptic oral rinse (CPC / CETYLPYRIDINIUM CHLORIDE 0.05%) solution 7 mL  7 mL Mouth Rinse BID Dorothy Spark, MD   7 mL at 04/16/16 2200  . aspirin chewable tablet 81 mg  81 mg Oral Daily Liliane Shi, PA-C   81 mg at 04/16/16 1035  . atenolol (TENORMIN) tablet 25 mg  25 mg Oral BID Liliane Shi, PA-C   25 mg at 04/16/16 2200  . cefTRIAXone (ROCEPHIN) 2 g in dextrose 5 % 50 mL IVPB  2 g Intravenous Q24H Dorothy Spark, MD   2 g at 04/16/16 2230  . cholecalciferol (VITAMIN D) tablet 1,000 Units  1,000 Units Oral BID Liliane Shi, PA-C   1,000 Units at 04/16/16 2115  . clonazePAM (KLONOPIN) tablet 0.25 mg  0.25 mg Oral TID PRN Ambrose Finland, MD   0.25 mg at 04/17/16 0130  . clonazePAM (KLONOPIN) tablet 0.5 mg  0.5 mg Oral  BID Leata Mouse, MD   0.5 mg at 04/16/16 2115  . enoxaparin (LOVENOX) injection 40 mg  40 mg Subcutaneous Q24H Beatrice Lecher, PA-C   40 mg at 04/16/16 2000  . escitalopram (LEXAPRO) tablet 5 mg  5 mg Oral QHS Leata Mouse, MD   5 mg at 04/16/16 2114  . feeding supplement (ENSURE ENLIVE) (ENSURE ENLIVE) liquid 237 mL  237 mL Oral BID BM Lars Masson, MD   237 mL at 04/16/16 1036  . furosemide (LASIX) injection 80 mg  80 mg Intravenous BID Ok Anis, NP   80 mg at 04/17/16 0852  . gabapentin (NEURONTIN) capsule 100 mg  100 mg Oral BID Leata Mouse, MD   100 mg at 04/16/16 2115  . insulin aspart (novoLOG) injection 0-20 Units  0-20 Units Subcutaneous TID WC Beatrice Lecher, PA-C   3 Units at 04/15/16 1708  . insulin aspart (novoLOG) injection 0-5 Units  0-5 Units Subcutaneous QHS Beatrice Lecher, PA-C   0 Units at 04/14/16 2200  . insulin aspart protamine- aspart (NOVOLOG MIX 70/30) injection 10 Units  10 Units Subcutaneous BID WC Beatrice Lecher, PA-C   10 Units at 04/17/16 [phone]  . linagliptin (TRADJENTA) tablet 5 mg  5 mg  Oral Daily Beatrice Lecher, PA-C   5 mg at 04/16/16 1034  . metFORMIN (GLUCOPHAGE) tablet 1,000 mg  1,000 mg Oral BID WC Beatrice Lecher, PA-C   1,000 mg at 04/17/16 8260  . nitroGLYCERIN (NITROSTAT) SL tablet 0.4 mg  0.4 mg Sublingual Q5 Min x 3 PRN Beatrice Lecher, PA-C      . ondansetron (ZOFRAN) injection 4 mg  4 mg Intravenous Q6H PRN Beatrice Lecher, PA-C      . polyethylene glycol (MIRALAX / GLYCOLAX) packet 17 g  17 g Oral Daily Beatrice Lecher, PA-C   17 g at 04/16/16 1032  . potassium chloride SA (K-DUR,KLOR-CON) CR tablet 20 mEq  20 mEq Oral BID Beatrice Lecher, PA-C   20 mEq at 04/16/16 2114  . pravastatin (PRAVACHOL) tablet 10 mg  10 mg Oral q1800 Beatrice Lecher, PA-C   10 mg at 04/16/16 1740  . senna (SENOKOT) tablet 8.6 mg  1 tablet Oral BID PRN Beatrice Lecher, PA-C      . sodium chloride (OCEAN) 0.65 % nasal spray 1 spray  1 spray Each Nare PRN Ok Anis, NP      . sodium chloride flush (NS) 0.9 % injection 10-40 mL  10-40 mL Intracatheter Q12H Lars Masson, MD   10 mL at 04/16/16 2200  . sodium chloride flush (NS) 0.9 % injection 10-40 mL  10-40 mL Intracatheter PRN Lars Masson, MD      . sodium chloride flush (NS) 0.9 % injection 3 mL  3 mL Intravenous Q12H Beatrice Lecher, PA-C   3 mL at 04/16/16 2200  . sodium chloride flush (NS) 0.9 % injection 3 mL  3 mL Intravenous PRN Beatrice Lecher, PA-C      . spironolactone (ALDACTONE) tablet 50 mg  50 mg Oral Daily Beatrice Lecher, PA-C   50 mg at 04/16/16 1035  . traMADol (ULTRAM) tablet 50 mg  50 mg Oral Q12H PRN Beatrice Lecher, PA-C   50 mg at 04/17/16 0402    Musculoskeletal: Strength & Muscle Tone: decreased Gait & Station: unable to stand Patient leans: N/A  Psychiatric Specialty Exam: Physical Exam  ROS.  Blood pressure  116/74, pulse 57, temperature 97.4 F (36.3 C), temperature source Oral, resp. rate 16, height '5\' 5"'$  (1.651 m), weight 111.5 kg (245 lb 13 oz), SpO2 92 %.Body mass index is 40.91 kg/(m^2).   General Appearance: Guarded  Eye Contact:  Good  Speech:  Clear and Coherent  Volume:  Decreased  Mood:  Anxious and Depressed  Affect:  Appropriate and Congruent  Thought Process:  Coherent  Orientation:  Full (Time, Place, and Person)  Thought Content:  WDL  Suicidal Thoughts:  No  Homicidal Thoughts:  No  Memory:  Immediate;   Good Recent;   Fair Remote;   Fair  Judgement:  Intact  Insight:  Fair  Psychomotor Activity:  Decreased  Concentration:  Concentration: Fair  Recall:  [place] of Knowledge:  Good  Language:  Fair  Akathisia:  Negative  Handed:  Right  AIMS (if indicated):     Assets:  Communication Skills Desire for Improvement Financial Resources/Insurance Housing Intimacy Leisure Time Resilience Social Support Transportation  ADL's:  Impaired  Cognition:  WNL  Sleep:        Treatment Plan Summary: Patient slowly responding to her current medication management  Continue klonopin 0.5 mg PO BID and Klonopin 25 mg TID/PRN for anxiety and avoid sedated and SOB more than normal for her Continue Lexapro 5 mg PO QHS for depression and generalized anxiety Continue Gabapentin 100 mg PO BID for excessive anxiety Appreciate psychiatric consultation and we sign off at this time as she has no further needs Please contact [phone] or [phone] if needs further assistance  Disposition: Patient will be referred to the outpatient medication management and medical history. Patient does not meet criteria for psychiatric inpatient admission. Supportive therapy provided about ongoing stressors.  Ambrose Finland, MD 04/17/2016 9:30 AM

## 2016-04-17 NOTE — Progress Notes (Signed)
[address]       [place] [address]             [phone]        CARDIOTHORACIC SURGERY PROGRESS NOTE  Subjective: Looks considerably better and reports feeling better.    Objective: Vital signs: BP Readings from Last 1 Encounters:  04/17/16 116/74   Pulse Readings from Last 1 Encounters:  04/17/16 57   Resp Readings from Last 1 Encounters:  04/17/16 16   Temp Readings from Last 1 Encounters:  04/17/16 97.4 F (36.3 C) Oral    Hemodynamics: CVP:  [11 mmHg-23 mmHg] 17 mmHg  Physical Exam:  Rhythm:   sinus  Breath sounds: Diminished at bases w/ bibasilar rales  Heart sounds:  RRR w/ systolic murmur  Incisions:  n/a  Abdomen:  soft  Extremities:  Warm, severe LE edema   Intake/Output from previous day: 05/25 0701 - 05/26 0700 In: 650 [P.O.:600; IV Piggyback:50] Out: 2650 [Urine:2650] Intake/Output this shift: Total I/O In: 240 [P.O.:240] Out: 350 [Urine:350]  Lab Results:  CBC: Recent Labs  04/14/16 1840  WBC 13.4*  HGB 11.4*  HCT 39.0  PLT 596*    BMET:  Recent Labs  04/16/16 0329 04/17/16 0325  NA 135 136  K 4.1 4.1  CL 89* 89*  CO2 36* 40*  GLUCOSE 95 116*  BUN 16 18  CREATININE 0.87 0.81  CALCIUM 10.9* 10.8*     PT/INR:   Recent Labs  04/14/16 1840  LABPROT 14.6  INR 1.12    CBG (last 3)   Recent Labs  04/16/16 1717 04/16/16 2112 04/17/16 0817  GLUCAP 110* 81 118*    ABG    Component Value Date/Time   PHART 7.310* 02/07/2016 0003   PCO2ART 59.3* 02/07/2016 0003   PO2ART 379.0* 02/07/2016 0003   HCO3 30.0* 02/07/2016 0003   TCO2 32 02/07/2016 0003   O2SAT 68.4 04/17/2016 0325    CXR: n/a  Assessment/Plan:  Margaret [name] looks noticeably better this morning. She has improved with diuresis. Margaret breathing is much better and she is much less anxious. She remains afebrile. Mild leukocytosis persists.  Follow-up surveillance blood cultures remain no growth so far. Urine culture no  growth.  Follow-up from infectious disease team pending.  At this point I am more encouraged that it might be reasonable to offer this patient the possibility of surgical management for Margaret severe mitral regurgitation. It is clear that Margaret prognosis would be grim without surgery. At the time of our office consultation last week I spent a considerable amount of time with the patient and Margaret [name] discussing treatment options and disposition. If she continues to improve and demonstrates inability to work with physical therapy and occupational therapy, I would favor consultation with the physical medicine rehabilitation team. If she were to survive high-risk mitral valve repair or replacement without significant complications, I feel that the best long-term plan would involve eventual discharge to the inpatient rehabilitation service where she could be more aggressively treated for recovery from Margaret stroke and monitored closely with respect to Margaret congestive heart failure. Margaret [name] has expressed clear understanding and verbalized desire to bring Margaret home when appropriate rather than sending Margaret back to a skilled nursing facility.  If the patient continues to improve she will need the following tests completed before we could make definitive plans regarding surgery:  1.  Left and right heart catheterization 2.  PFT's with diffusion capacity 3.  ABG on room air 4.  MRI brain f/u stroke 5.  CT angio head (recommended by Dr. Erlinda Hong)  I have not ordered any of the above referenced tests as it may make more sense to allow Margaret to demonstrate further improvement in Margaret clinical condition.  I will plan to follow up again early next week.  I spent in excess of 30 minutes during the conduct of this hospital encounter and >50% of this time involved direct face-to-face encounter with the patient for counseling and/or coordination of their care.   Rexene Alberts, MD 04/17/2016 10:21 AM

## 2016-04-17 NOTE — Progress Notes (Signed)
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 Patient is known to me from previous inpatient consultation on 02/12/2016. We were unable to obtain dental radiographs at that time due to acute stroke. Orthopantogram has been taken and was reviewed. Patient is now seen to complete pre-mitral valve replacement dental protocol consultation.  Patient Active Problem List   Diagnosis Date Noted  . 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 Acute on chronic diastolic congestive heart failure due to valvular disease ([place]) 04/14/2016  . CHF due to valvular disease, acute on chronic, diastolic [id number]  . Chronic diastolic congestive heart failure due to valvular disease ([place]) 04/13/2016  . History of stroke 04/13/2016  . Cerebrovascular accident (CVA) due to occlusion of right carotid artery ([place]) 03/25/2016  . Splenic infarct 03/25/2016  . Type 2 diabetes mellitus with circulatory disorder ([place]) 03/25/2016  . Mitral regurgitation 03/06/2016  . HLD (hyperlipidemia) 03/06/2016  . Right carotid artery occlusion   . Epigastric abdominal tenderness on direct palpation   . Endocarditis of mitral valve   . Positive ANA (antinuclear antibody)   . Streptococcus viridans infection 02/04/2016  . Cerebrovascular accident (CVA) due to embolism of cerebral artery ([place])   . Subarachnoid hemorrhage ([place]) 02/01/2016  . Stroke ([place])   . Visual changes   . Pulmonary infiltrates 01/23/2016  . Pulmonary hypertension assoc with unclear multi-factorial mechanisms ([place])  01/23/2016  . 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 Insulin dependent diabetes mellitus ([place]) 01/14/2016  . Essential hypertension 01/14/2016  . Hepatic cirrhosis ([place]) 01/14/2016  . COPD (chronic obstructive pulmonary disease) ([place]) 04/27/2012  . Chronic diastolic CHF (congestive heart failure) ([place]) 04/27/2012  . Morbid obesity ([place]) 04/27/2012  . Hypoxia 04/27/2012  . Dyspnea on exertion 04/27/2012  . Smoker 04/27/2012   Past Medical History  Diagnosis Date  . Hypertension   . Hyperlipidemia   . COPD (chronic obstructive pulmonary disease) ([place]) 04/20/12  . Asthma   . Type II diabetes mellitus ([place])   . Anemia   . H/O hiatal hernia   . Osteoarthritis   . Pneumonia 04/2012  . Migraines   . Panic attacks 04/20/12  . Morbid obesity ([place])   . Heart murmur   . Bacterial endocarditis     Strep viridans   . Chronic diastolic CHF (congestive heart failure) ([place]) 04/27/2012  . Pulmonary hypertension assoc with unclear multi-factorial mechanisms ([place]) 01/23/2016  . Positive ANA (antinuclear antibody)   . 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 Stroke ([place])   . Subarachnoid bleed ([place]) 02/01/2016    Allergies  Allergen Reactions  . Codeine Hives, Itching and Other (See Comments)    "breathing problems"  . Latex Itching   Current Facility-Administered Medications  Medication Dose Route Frequency Provider Last Rate Last Dose  . 0.9 %  sodium chloride infusion  250 mL Intravenous PRN Liliane Shi, PA-C      . acetaminophen (TYLENOL) tablet 650  mg  650 mg Oral Q4H PRN Liliane Shi, PA-C      . albuterol (PROVENTIL) (2.5 MG/3ML) 0.083% nebulizer solution 2.5 mg  2.5 mg Inhalation Q4H PRN Liliane Shi, PA-C      . albuterol (PROVENTIL) (2.5 MG/3ML) 0.083% nebulizer solution 2.5 mg  2.5 mg Nebulization QHS Liliane Shi, PA-C   2.5 mg at 04/16/16 1947  . antiseptic oral rinse (CPC / CETYLPYRIDINIUM CHLORIDE 0.05%) solution 7 mL  7 mL Mouth Rinse BID Dorothy Spark, MD   7 mL at 04/17/16 1055   . aspirin chewable tablet 81 mg  81 mg Oral Daily Liliane Shi, PA-C   81 mg at 04/17/16 1047  . atenolol (TENORMIN) tablet 25 mg  25 mg Oral BID Liliane Shi, PA-C   25 mg at 04/17/16 1115  . cefTRIAXone (ROCEPHIN) 2 g in dextrose 5 % 50 mL IVPB  2 g Intravenous Q24H Dorothy Spark, MD   2 g at 04/16/16 2230  . cholecalciferol (VITAMIN D) tablet 1,000 Units  1,000 Units Oral BID Liliane Shi, PA-C   1,000 Units at 04/17/16 1047  . clonazePAM (KLONOPIN) tablet 0.25 mg  0.25 mg Oral TID PRN Ambrose Finland, MD   0.25 mg at 04/17/16 0130  . clonazePAM (KLONOPIN) tablet 0.5 mg  0.5 mg Oral BID Ambrose Finland, MD   0.5 mg at 04/16/16 2115  . enoxaparin (LOVENOX) injection 40 mg  40 mg Subcutaneous Q24H Liliane Shi, PA-C   40 mg at 04/16/16 2000  . escitalopram (LEXAPRO) tablet 5 mg  5 mg Oral QHS Ambrose Finland, MD   5 mg at 04/16/16 2114  . feeding supplement (ENSURE ENLIVE) (ENSURE ENLIVE) liquid 237 mL  237 mL Oral BID BM Dorothy Spark, MD   237 mL at 04/17/16 1055  . furosemide (LASIX) injection 80 mg  80 mg Intravenous BID Rogelia Mire, NP   80 mg at 04/17/16 0852  . gabapentin (NEURONTIN) capsule 100 mg  100 mg Oral BID Ambrose Finland, MD   100 mg at 04/17/16 1047  . insulin aspart (novoLOG) injection 0-20 Units  0-20 Units Subcutaneous TID WC Liliane Shi, PA-C   3 Units at 04/15/16 1708  . insulin aspart (novoLOG) injection 0-5 Units  0-5 Units Subcutaneous QHS Liliane Shi, PA-C   0 Units at 04/14/16 2200  . insulin aspart protamine- aspart (NOVOLOG MIX 70/30) injection 10 Units  10 Units Subcutaneous BID WC Liliane Shi, PA-C   10 Units at 04/17/16 [phone]  . linagliptin (TRADJENTA) tablet 5 mg  5 mg Oral Daily Liliane Shi, PA-C   5 mg at 04/17/16 1115  . metFORMIN (GLUCOPHAGE) tablet 1,000 mg  1,000 mg Oral BID WC Liliane Shi, PA-C   1,000 mg at 04/17/16 [id number]  . nitroGLYCERIN (NITROSTAT) SL tablet 0.4 mg  0.4 mg Sublingual Q5  Min x 3 PRN Liliane Shi, PA-C      . ondansetron (ZOFRAN) injection 4 mg  4 mg Intravenous Q6H PRN Liliane Shi, PA-C      . polyethylene glycol (MIRALAX / GLYCOLAX) packet 17 g  17 g Oral Daily Liliane Shi, PA-C   17 g at 04/16/16 1032  . potassium chloride SA (K-DUR,KLOR-CON) CR tablet 20 mEq  20 mEq Oral BID Liliane Shi, PA-C   20 mEq at 04/17/16 1047  . pravastatin (PRAVACHOL) tablet 10 mg  10 mg Oral q1800 Liliane Shi, PA-C  10 mg at 04/16/16 1740  . senna (SENOKOT) tablet 8.6 mg  1 tablet Oral BID PRN Liliane Shi, PA-C      . sodium chloride (OCEAN) 0.65 % nasal spray 1 spray  1 spray Each Nare PRN Rogelia Mire, NP      . sodium chloride flush (NS) 0.9 % injection 10-40 mL  10-40 mL Intracatheter Q12H Dorothy Spark, MD   10 mL at 04/17/16 1055  . sodium chloride flush (NS) 0.9 % injection 10-40 mL  10-40 mL Intracatheter PRN Dorothy Spark, MD      . sodium chloride flush (NS) 0.9 % injection 3 mL  3 mL Intravenous Q12H Liliane Shi, PA-C   3 mL at 04/17/16 1047  . sodium chloride flush (NS) 0.9 % injection 3 mL  3 mL Intravenous PRN Liliane Shi, PA-C      . spironolactone (ALDACTONE) tablet 50 mg  50 mg Oral Daily Liliane Shi, PA-C   50 mg at 04/17/16 1047  . traMADol (ULTRAM) tablet 50 mg  50 mg Oral Q12H PRN Liliane Shi, PA-C   50 mg at 04/17/16 [id number]   Dental history: Chief complaint: Patient referred by Dr. Roxy Manns for reevaluation of patient prior to anticipated heart valve surgery.  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 Patient with anticipated mitral valve replacement or repair the future. Patient now seen as part of a medically necessary pre-heart valve surgery dental protocol examination.  The patient currently denies acute toothaches, swellings, or abscesses. Patient was last seen "years [place] a Pharmacist, community in [place], [place]. The student Dr.'s name was Dr. Ronelle Nigh . Dr. Ronelle Nigh completed an upper complete denture and lower cast partial denture at that time. Patient indicates that the upper complete denture and lower cast partial denture "fit fine". Patient is not wearing the lower partial denture at this time. Patient denies having dental phobia.  DENTAL EXAMINATION: GENERAL:Patient is a well-developed, obese female in no acute distress.  HEAD AND NECK:There is no palpable submandibular lymphadenopathy. I cannot palpate any thyroid masses. Patient denies having any acute TMJ symptoms.  INTRAORAL EXAM: Patient has normal saliva. There is no evidence of oral abscess formation. There is atrophy of the edentulous alveolar ridges.  DENTITION:Patient is missing all maxillary teeth. Patient is also clinically missing tooth numbers 17 through 20, and 28 through 32.  PERIODONTAL:Patient has chronic periodontitis with plaque accumulations, gingival recession, and no significant tooth mobility of remaining teeth.  DENTAL CARIES/SUBOPTIMAL RESTORATIONS:No obvious dental caries are noted.  ENDODONTIC:Patient denies acute pulpitis symptoms. No obvious periapical pathology or radiolucencies are noted.   CROWN AND BRIDGE: Patient has a crown on tooth #27 that appears to be acceptable.  PROSTHODONTIC:Patient has an upper complete denture and lower partial denture that "fits fine "by patient report. Patient is not wearing the dentures at this time.  OCCLUSION:Unable to assess occlusion at this time.  RADIOGRAPHIC INTERPRETATION: An orthopantogram was taken today. Patient is missing tooth numbers 1 through 16, 17-20, 28-32. There is incipient bone loss noted. There is no evidence of periapical pathology or radiolucency. There is atrophy of the edentulous alveolar ridges. There is a crown on tooth #27.  ASSESSMENTS: 1. History of Streptococcal viridans bacterial endocarditis 2. Severe mitral  regurgitation 3. History of multiple embolic strokes 4. Anticipated mitral valve replacement or repair the future 5. Pre-heart valve surgery dental protocol examination 6.  Chronic periodontitis 7. Accretions 8. Multiple missing teeth 9. Upper complete and lower partial dentures. 10. No obvious dental etiology for bacterial endocarditis.   PLAN/RECOMMENDATIONS: 1. I discussed the risks, benefits, and complications of various treatment options with the patient relationship to her medical and dental conditions, anticipated heart valve surgery, and risk for future endocarditis. We discussed various treatment options to include no treatment, extraction of remaining teeth with alveoloplasty, periodontal therapy, dental restorations, crown and bridge therapy, implant therapy, and replacement of missing teeth as indicated. Patient is currently not interested in any dental treatment at this time. Patient understands that in order to minimize risk for future endocarditis, she should maintain regular dental cleanings with a primary dentist. Patient will need antibiotic premedication prior to invasive dental procedures due to history of endocarditis and anticipated heart valve surgery.   2. Discussion of findings with medical team and coordination of future medical and dental care as needed. Patient is currently cleared for heart valve surgery as indicated.   Lenn Cal, DDS

## 2016-04-17 NOTE — Consult Note (Addendum)
[facility]  Total days of antibiotics 4        Day 4 ceftriaxone               Reason for Consult: sequelae of MV endocarditis    Referring Physician: owen  Principal Problem:   Other depression due to general medical condition Active Problems:   Essential hypertension   Endocarditis of mitral valve   Mitral regurgitation   HLD (hyperlipidemia)   Type 2 diabetes mellitus with circulatory disorder (HCC)   History of stroke   Acute on chronic diastolic congestive heart failure due to valvular disease ([place])   CHF due to valvular disease, acute on chronic, diastolic   Panic disorder 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 female who recently finished 6 wk course of ampicillin for strep viridans MV endocarditis on 4/24. Her course was complicated by  acute CVA to right ICA occlusion and endovascular thrombectomy.  She was seen by Richardson Dopp of [facility] 5/2 and unfortunately the echo noted a persistent mitral valve vegetation with continued severe MR.She then followed up with Dr. Lilly Cove as an outpatient on 5/22 where she reported  short of breath with minimal activity and occasionally at rest. She cannot sleep at night because she cannot lay flat in bed. She has severe lower extremity edema with chronic transudative drainage from both lower legs related to chronic right-sided congestive heart failure with severe venous insufficiency. When she gets short of breath she feels tightness across her chest. 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 Dr Guy Sandifer assessment is that her cardiac status is quite tenuous and her rehabilitation appears to be limited primarily due to by her congestive heart failure.  Unfortunately, the patient currently looks as though she would do very poorly with surgery. she  would be a  high risk surgical intervention. She was admitted for evaluation adn management of CHF. Repeat surveillance cultures are no growth todate. She was empirically started on ceftriaxone again. TEE is difficult to tell if truly worsening vegetation vs. dessimated MV. Cleared for surgery from dental consultation. If she continues to do well with CHF management and increase tolerance, further testing such as right ad left heart cath will be done to assess vasculature for surgery. Remains afebrile.    Past Medical History  Diagnosis Date  . Hypertension   . Hyperlipidemia   . COPD (chronic obstructive pulmonary disease) ([place]) 04/20/12  . Asthma   . Type II diabetes mellitus ([place])   . Anemia   . H/O hiatal hernia   . Osteoarthritis   . Pneumonia 04/2012  . Migraines   . Panic attacks 04/20/12  . Morbid obesity ([place])   . Heart murmur   . Bacterial endocarditis     Strep viridans   . Chronic diastolic CHF (congestive heart failure) ([place]) 04/27/2012  . Pulmonary hypertension assoc with unclear multi-factorial mechanisms ([place]) 01/23/2016  . Positive ANA (antinuclear antibody)   . 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 Stroke ([place])   . Subarachnoid bleed ([place]) 02/01/2016    Allergies:  Allergies  Allergen Reactions  . Codeine Hives, Itching and Other (See Comments)    "breathing problems"  . Latex Itching     MEDICATIONS: . albuterol  2.5 mg Nebulization QHS  . antiseptic oral rinse  7 mL Mouth  Rinse BID  . aspirin  81 mg Oral Daily  . atenolol  25 mg Oral BID  . cefTRIAXone (ROCEPHIN)  IV  2 g Intravenous Q24H  . cholecalciferol  1,000 Units Oral BID  . clonazePAM  0.5 mg Oral BID  . enoxaparin (LOVENOX) injection  40 mg Subcutaneous Q24H  . escitalopram  5 mg Oral QHS  . feeding supplement (ENSURE ENLIVE)  237 mL Oral BID BM  . furosemide  80 mg Intravenous BID  . gabapentin  100 mg Oral BID  . insulin aspart  0-20 Units Subcutaneous TID WC  . insulin aspart  0-5 Units  Subcutaneous QHS  . insulin aspart protamine- aspart  10 Units Subcutaneous BID WC  . linagliptin  5 mg Oral Daily  . metFORMIN  1,000 mg Oral BID WC  . polyethylene glycol  17 g Oral Daily  . potassium chloride  20 mEq Oral BID  . pravastatin  10 mg Oral q1800  . sodium chloride flush  10-40 mL Intracatheter Q12H  . sodium chloride flush  3 mL Intravenous Q12H  . spironolactone  50 mg Oral Daily    Social History  Substance Use Topics  . Smoking status: Former Smoker -- 0.20 packs/day for 40 years    Types: Cigarettes    Quit date: 04/20/2012  . Smokeless tobacco: Never Used     Comment: 6/5//13 "a pack of cigarettes would last me 1 - 1 1/2 wk"  . Alcohol Use: No    Family History  Problem Relation Age of Onset  . Allergies    . Hypertension    . Coronary artery disease Mother 62  . Stroke Father   . COPD Father   . Clotting disorder Maternal Grandmother     died from blood clot     Review of Systems  Constitutional: Negative for fever, chills, diaphoresis, activity change, appetite change, fatigue and unexpected weight change.  HENT: Negative for congestion, sore throat, rhinorrhea, sneezing, trouble swallowing and sinus pressure.  Eyes: Negative for photophobia and visual disturbance.  Respiratory: Negative for cough, chest tightness, positive shortness of breath, orthopnea, DOE. wheezing and stridor.  Cardiovascular: Negative for chest pain, palpitations and leg swelling.  Gastrointestinal: Negative for nausea, vomiting, abdominal pain, diarrhea, constipation, blood in stool, abdominal distention and anal bleeding.  Genitourinary: Negative for dysuria, hematuria, flank pain and difficulty urinating.  Musculoskeletal: Negative for myalgias, back pain, joint swelling, arthralgias and gait problem.  Skin: color change, pallor, rash and wound.  Neurological: Negative for dizziness, tremors, weakness and light-headedness.  Hematological: Negative for adenopathy. Does not  bruise/bleed easily.  Psychiatric/Behavioral: Negative for behavioral problems, confusion, sleep disturbance, dysphoric mood, decreased concentration and agitation.     OBJECTIVE: Temp:  [96.3 F (35.7 C)-97.8 F (36.6 C)] 97.4 F (36.3 C) (05/26 0813) Pulse Rate:  [55-71] 57 (05/26 0739) Resp:  [14-18] 16 (05/26 0813) BP: (99-116)/(57-91) 110/72 mmHg (05/26 1115) SpO2:  [92 %-99 %] 92 % (05/26 0739) Weight:  [245 lb 13 oz (111.5 kg)] 245 lb 13 oz (111.5 kg) (05/26 0400) Physical Exam  Constitutional:  oriented to person, place, and time. appears chronically ill, bm 30+. No distress.  HENT: [place]/AT, PERRLA, no scleral icterus Mouth/Throat: Oropharynx is clear and moist. No oropharyngeal exudate.  Cardiovascular: Normal rate, regular rhythm and normal heart sounds. Exam reveals no gallop and no friction rub.  No murmur heard.  Pulmonary/Chest: Effort normal and breath sounds normal. No respiratory distress.  has no wheezes.  Neck =  supple, no nuchal rigidity Abdominal: Soft. Bowel sounds are normal.  exhibits no distension. There is no tenderness.  Lymphadenopathy: no cervical adenopathy. No axillary adenopathy Neurological: alert and oriented to person, place, and time.  Skin: Skin is warm and dry. Venous stasis changes. No rash noted. No erythema.  Ext: +2pitting edema bilaterally Psychiatric: a normal mood and affect.  behavior is normal.    LABS: Results for orders placed or performed during the hospital encounter of 04/14/16 (from the past 48 hour(s))  Glucose, capillary     Status: Abnormal   Collection Time: 04/15/16  4:40 PM  Result Value Ref Range   Glucose-Capillary 125 (H) 65 - 99 mg/dL   Comment 1 Notify RN   Glucose, capillary     Status: Abnormal   Collection Time: 04/15/16  9:28 PM  Result Value Ref Range   Glucose-Capillary 103 (H) 65 - 99 mg/dL   Comment 1 Capillary Specimen   Urinalysis, Routine w reflex microscopic (not at [facility])     Status: Abnormal    Collection Time: 04/16/16  1:17 AM  Result Value Ref Range   Color, Urine AMBER (A) YELLOW    Comment: BIOCHEMICALS MAY BE AFFECTED BY COLOR   APPearance CLOUDY (A) CLEAR   Specific Gravity, Urine 1.017 1.005 - 1.030   pH 5.5 5.0 - 8.0   Glucose, UA NEGATIVE NEGATIVE mg/dL   Hgb urine dipstick LARGE (A) NEGATIVE   Bilirubin Urine NEGATIVE NEGATIVE   Ketones, ur NEGATIVE NEGATIVE mg/dL   Protein, ur 30 (A) NEGATIVE mg/dL   Nitrite NEGATIVE NEGATIVE   Leukocytes, UA MODERATE (A) NEGATIVE  Urine microscopic-add on     Status: Abnormal   Collection Time: 04/16/16  1:17 AM  Result Value Ref Range   Squamous Epithelial / LPF 0-5 (A) NONE SEEN   WBC, UA 0-5 0 - 5 WBC/hpf   RBC / HPF TOO NUMEROUS TO COUNT 0 - 5 RBC/hpf   Bacteria, UA RARE (A) NONE SEEN   Casts HYALINE CASTS (A) NEGATIVE  Culture, Urine     Status: None   Collection Time: 04/16/16  1:18 AM  Result Value Ref Range   Specimen Description URINE, CATHETERIZED    Special Requests rocephin Immunocompromised    Culture NO GROWTH    Report Status 04/17/2016 FINAL   Basic metabolic panel     Status: Abnormal   Collection Time: 04/16/16  3:29 AM  Result Value Ref Range   Sodium 135 135 - 145 mmol/L   Potassium 4.1 3.5 - 5.1 mmol/L   Chloride 89 (L) 101 - 111 mmol/L   CO2 36 (H) 22 - 32 mmol/L   Glucose, Bld 95 65 - 99 mg/dL   BUN 16 6 - 20 mg/dL   Creatinine, Ser 0.87 0.44 - 1.00 mg/dL   Calcium 10.9 (H) 8.9 - 10.3 mg/dL   GFR calc non Af Amer >60 >60 mL/min   GFR calc Af Amer >60 >60 mL/min    Comment: (NOTE) The eGFR has been calculated using the CKD EPI equation. This calculation has not been validated in all clinical situations. eGFR's persistently <60 mL/min signify possible Chronic Kidney Disease.    Anion gap 10 5 - 15  Glucose, capillary     Status: None   Collection Time: 04/16/16  7:48 AM  Result Value Ref Range   Glucose-Capillary 98 65 - 99 mg/dL   Comment 1 Capillary Specimen   Glucose, capillary      Status: None  Collection Time: 04/16/16 12:09 PM  Result Value Ref Range   Glucose-Capillary 88 65 - 99 mg/dL   Comment 1 Capillary Specimen   Carboxyhemoglobin     Status: Abnormal   Collection Time: 04/16/16  2:30 PM  Result Value Ref Range   Total hemoglobin 11.8 (L) 12.0 - 16.0 g/dL   O2 Saturation 75.7 %   Carboxyhemoglobin 1.4 0.5 - 1.5 %   Methemoglobin 0.4 0.0 - 1.5 %  Glucose, capillary     Status: Abnormal   Collection Time: 04/16/16  5:17 PM  Result Value Ref Range   Glucose-Capillary 110 (H) 65 - 99 mg/dL   Comment 1 Capillary Specimen   Glucose, capillary     Status: None   Collection Time: 04/16/16  9:12 PM  Result Value Ref Range   Glucose-Capillary 81 65 - 99 mg/dL   Comment 1 Notify RN   Basic metabolic panel     Status: Abnormal   Collection Time: 04/17/16  3:25 AM  Result Value Ref Range   Sodium 136 135 - 145 mmol/L   Potassium 4.1 3.5 - 5.1 mmol/L   Chloride 89 (L) 101 - 111 mmol/L   CO2 40 (H) 22 - 32 mmol/L   Glucose, Bld 116 (H) 65 - 99 mg/dL   BUN 18 6 - 20 mg/dL   Creatinine, Ser 0.81 0.44 - 1.00 mg/dL   Calcium 10.8 (H) 8.9 - 10.3 mg/dL   GFR calc non Af Amer >60 >60 mL/min   GFR calc Af Amer >60 >60 mL/min    Comment: (NOTE) The eGFR has been calculated using the CKD EPI equation. This calculation has not been validated in all clinical situations. eGFR's persistently <60 mL/min signify possible Chronic Kidney Disease.    Anion gap 7 5 - 15  Carboxyhemoglobin     Status: Abnormal   Collection Time: 04/17/16  3:25 AM  Result Value Ref Range   Total hemoglobin 11.7 (L) 12.0 - 16.0 g/dL   O2 Saturation 68.4 %   Carboxyhemoglobin 1.5 0.5 - 1.5 %   Methemoglobin 0.5 0.0 - 1.5 %  Glucose, capillary     Status: Abnormal   Collection Time: 04/17/16  8:17 AM  Result Value Ref Range   Glucose-Capillary 118 (H) 65 - 99 mg/dL   Comment 1 Capillary Specimen   Glucose, capillary     Status: None   Collection Time: 04/17/16 12:35 PM  Result Value  Ref Range   Glucose-Capillary 72 65 - 99 mg/dL   Comment 1 RN AWARE   Glucose, capillary     Status: None   Collection Time: 04/17/16 12:54 PM  Result Value Ref Range   Glucose-Capillary 73 65 - 99 mg/dL   Comment 1 RN AWARE,MD NOTIFIED     MICRO: 5/24 blood cx ngtd  Assessment/Plan:  60yo F with severe MR recently treated for strep viridans native MV endocarditis c/b embolic stroke finished on 4/26. Has worsening heart failure would need valve surgery though very high risk. Surveillance cultures are ngtd.concern that her repeat TEE of worsening MR is due to active infection vs. Sequelae of infection  If surveillance cx are no growth to date at 5 days (which would be a late growth if it occurred). Can stop ceftriaxone.  Acute on chronic chf = defer to primary team for management of CHF  Dr hatcher available for questions. Will see back on Tuesday  Deya Bigos B. [facility] [phone]

## 2016-04-17 NOTE — Progress Notes (Addendum)
Physical Therapy Treatment Patient Details Name: Margaret [name] MRN: [id number] DOB: 1956/07/28 Today's Date: 04/17/2016    History of Present Illness Margaret [name] is a 60 y.o. female admitted with acute  CHF exacerbation.  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    PT Comments    Patient very alert and able to follow instructions (although sometimes delayed motor response in LLE). Developing knee control on left. Recommend LUE sling to protect Lt shoulder when up with therapy.   Follow Up Recommendations  SNF     Equipment Recommendations  None recommended by PT    Recommendations for Other Services       Precautions / Restrictions Precautions Precautions: Fall Precaution Comments: L hemiparesis    Mobility  Bed Mobility Overal bed mobility: Needs Assistance Bed Mobility: Sit to Supine       Sit to supine: Mod assist;+2 for physical assistance   General bed mobility comments: sitting EOB on arrival with family present  Transfers Overall transfer level: Needs assistance Equipment used: None Transfers: Sit to/from Stand Sit to Stand: Mod assist;+2 physical assistance;+2 safety/equipment         General transfer comment: stood x2 from EOB; assist for lift off and balance, support LLE  Ambulation/Gait             General Gait Details: unable; tried to side step to right with pt "shimmying" on rt foot   Stairs            Wheelchair Mobility    Modified Rankin (Stroke Patients Only)       Balance Overall balance assessment: Needs assistance Sitting-balance support: No upper extremity supported;Feet supported Sitting balance-Leahy Scale: Good     Standing balance support: Bilateral upper extremity  supported Standing balance-Leahy Scale: Poor Standing balance comment: posterior bias; tends to lock LLE in extension and retract hip; able to gently flex and extend LLE with verbal cues and closeguarding of knee to prevent buckling                    Cognition Arousal/Alertness: Awake/alert Behavior During Therapy: Anxious Overall Cognitive Status: History of cognitive impairments - at baseline                      Exercises Other Exercises Other Exercises: educated re: importance of supporting LUE when in sitting. Pt endorses Lt shoulder pain and lt hand tightness (+edema)    General Comments General comments (skin integrity, edema, etc.): Stood x 1 minute, then 2.5 minutes while working on upright posture, LLE control, weight shifting      Pertinent Vitals/Pain Pain Assessment: Faces Faces Pain Scale: Hurts even more Pain Location: back of thighs where sitting on EOB/bedrail Pain Descriptors / Indicators: Grimacing;Discomfort Pain Intervention(s): Monitored during session;Repositioned    Home Living                      Prior Function            PT Goals (current goals can now be found in the care plan section) Acute Rehab PT Goals Patient Stated Goal: to regain independence  Time For Goal Achievement: 04/22/16 Progress towards PT goals: Progressing toward goals    Frequency  Min 2X/week  PT Plan Current plan remains appropriate    Co-evaluation             End of Session Equipment Utilized During Treatment: Gait belt Activity Tolerance: Patient tolerated treatment well Patient left: in bed;with call bell/phone within reach;with nursing/sitter in room;with family/visitor present (returned to supine for RN testing )     Time: 4098-1191 PT Time Calculation (min) (ACUTE ONLY): 17 min  Charges:  $Neuromuscular Re-education: 8-22 mins                    G Codes:      Margaret [name] 05/15/16, 3:16 PM  Pager [phone]

## 2016-04-17 NOTE — Clinical Social Work Note (Addendum)
CSW received consult for patient needing SNF placement, CSW to assess and discuss if patient is in agreement to going to SNF for short term rehab.  CSW received phone call from physician who states patient is not medically ready for discharge yet, CSW to continue to follow patient's progress throughout discharge planning.  Jones Broom. Gray, MSW, [place] 04/17/2016 8:51 AM

## 2016-04-17 NOTE — Progress Notes (Signed)
Pts foley came out at shift change. New foley reinserted by myself and Di Kindle, RN using all sterile precautions. This foley came out when pt tried to reposition in bed. Refuses to have another foley inserted and states she is 'frustrated' with the foley catheters.   Will use bedside commode or bedpan. Pt is a two person assist to [facility] and very weak on left side.

## 2016-04-17 NOTE — Progress Notes (Signed)
Patient Name: Margaret [name] Date of Encounter: 04/17/2016  Primary Cardiologist: Dr. Meda Coffee  Patient profile: Margaret [name] is a 60 y.o. female with a hx of COPD, HFpEF (seen by Dr. 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     Principal Problem:   Other depression due to general medical condition Active Problems:   Essential hypertension   Endocarditis of mitral valve   Mitral regurgitation   HLD (hyperlipidemia)   Type 2 diabetes mellitus with circulatory disorder (HCC)   History of stroke   Acute on chronic diastolic congestive heart failure due to valvular disease ([place])   CHF due to valvular disease, acute on chronic, diastolic   Panic disorder without agoraphobia with severe panic attacks    SUBJECTIVE  Denies any CP. SOB improving.   CURRENT MEDS . albuterol  2.5 mg Nebulization QHS  . antiseptic oral rinse  7 mL Mouth Rinse BID  . aspirin  81 mg Oral Daily  . atenolol  25 mg Oral BID  . cefTRIAXone (ROCEPHIN)  IV  2 g Intravenous Q24H  . cholecalciferol  1,000 Units Oral BID  . clonazePAM  0.5 mg Oral BID  . enoxaparin (LOVENOX) injection  40 mg Subcutaneous Q24H  . escitalopram  5 mg Oral QHS  . feeding supplement (ENSURE ENLIVE)  237 mL Oral BID BM  . furosemide  80 mg Intravenous BID  . gabapentin  100 mg Oral BID  . insulin aspart  0-20 Units Subcutaneous TID WC  . insulin aspart  0-5 Units Subcutaneous QHS  . insulin aspart protamine- aspart  10 Units Subcutaneous BID WC  . linagliptin  5 mg Oral Daily  . metFORMIN  1,000 mg Oral BID WC  . polyethylene glycol  17 g Oral Daily  . potassium chloride  20 mEq Oral BID  . pravastatin  10 mg Oral q1800  . sodium  chloride flush  10-40 mL Intracatheter Q12H  . sodium chloride flush  3 mL Intravenous Q12H  . spironolactone  50 mg Oral Daily    OBJECTIVE  Filed Vitals:   04/17/16 0011 04/17/16 0100 04/17/16 0400 04/17/16 0739  BP: 110/57  99/71 116/74  Pulse: 62 70 65 57  Temp: 97.4 F (36.3 C)  97.6 F (36.4 C)   TempSrc: Axillary  Oral   Resp: 14  16   Height:      Weight:   245 lb 13 oz (111.5 kg)   SpO2: 99% 95% 96% 92%    Intake/Output Summary (Last 24 hours) at 04/17/16 0818 Last data filed at 04/17/16 0700  Gross per 24 hour  Intake    650 ml  Output   2650 ml  Net  -2000 ml   Filed Weights   04/15/16 0500 04/16/16 0431 04/17/16 0400  Weight: 256 lb 2.8 oz (116.2 kg) 223 lb 8.7 oz (101.4 kg) 245 lb 13 oz (111.5 kg)    PHYSICAL EXAM  General: Pleasant, NAD. Neuro: Alert and oriented X 3. Moves all extremities spontaneously. Psych: Flat effect, sluggish HEENT:  Normal  Neck: Supple without bruits. Unable to assess JVD Lungs:  Resp regular and unlabored. Difficult to hear breath sound given her obesity. R subclavian PICC line Heart: RRR no s3, s4. 2/6  systolic murmur Abdomen: Soft, non-tender, non-distended, BS + x 4.  Extremities: No clubbing, cyanosis. Bilateral brown skin changes in LE, unable to assess pulse due to significant edema. 2-3+ pitting edema  Accessory Clinical Findings  CBC  Recent Labs  04/14/16 1840  WBC 13.4*  NEUTROABS 10.4*  HGB 11.4*  HCT 39.0  MCV 89.4  PLT 0000000*   Basic Metabolic Panel  Recent Labs  04/15/16 0320 04/16/16 0329 04/17/16 0325  NA 137 135 136  K 4.0 4.1 4.1  CL 93* 89* 89*  CO2 36* 36* 40*  GLUCOSE 118* 95 116*  BUN 8 16 18   CREATININE 0.72 0.87 0.81  CALCIUM 10.7* 10.9* 10.8*  MG 2.2  --   --    Liver Function Tests  Recent Labs  04/14/16 1840  AST 20  ALT 14  ALKPHOS 75  BILITOT 0.3  PROT 7.3  ALBUMIN 3.0*    TELE NSR    ECG  No new EKG  Echocardiogram 03/31/2016  LV EF: 60% -  65%  ------------------------------------------------------------------- Indications: MVD [non-rheumatic] 424.0.  ------------------------------------------------------------------- History: PMH: Panic attacks. Murmur.Bacterial endocarditis. Pulmonary hypertension. Subarachnoid bleed. Congestive heart failure. Stroke. Chronic obstructive pulmonary disease. Risk factors: Hypertension. Diabetes mellitus. Dyslipidemia.  ------------------------------------------------------------------- Study Conclusions  - Left ventricle: The cavity size was normal. There was moderate  concentric hypertrophy. Systolic function was normal. The  estimated ejection fraction was in the range of 60% to 65%. Wall  motion was normal; there were no regional wall motion  abnormalities. Features are consistent with a pseudonormal left  ventricular filling pattern, with concomitant abnormal relaxation  and increased filling pressure (grade 2 diastolic dysfunction).  Doppler parameters are consistent with elevated ventricular  end-diastolic filling pressure. - Aortic valve: Trileaflet; normal thickness leaflets. There was no  regurgitation. - Aortic root: The aortic root was normal in size. - Mitral valve: Moderately thickened, moderately calcified leaflets  . There was severe regurgitation directed centrally and  anteriorly. - Left atrium: The atrium was severely dilated. - Right ventricle: The cavity size was normal. Wall thickness was  normal. Systolic function was normal. - Right atrium: The atrium was mildly dilated. - Pulmonic valve: There was mild regurgitation. - Pulmonary arteries: Systolic pressure was within the normal  range. - Inferior vena cava: The vessel was normal in size.  Impressions:  - There is a highly mobile echodensity attached to the posterior  mitral valve leaflet measuring 24 mm in its longest diameter.  This is most probably consistent with a  vegetation.  When compared to the prior study from 02/11/2016 this might be  slightly increased in size.  Mitral regurgitation is severe.  Normal RVSP.     Radiology/Studies  Dg Orthopantogram  04/15/2016  CLINICAL DATA:  60 year old female with cardiac valvular disease and CHF. Poor dentition. Initial encounter. EXAM: ORTHOPANTOGRAM/PANORAMIC COMPARISON:  CTA neck 02/01/2016 FINDINGS: Due to motion artifact on this exam despite repeated attempts detail of the central mandibular dentition is inferior to that on 02/01/2016. Maxillary dentition is absent. No definite dental caries or periapical dental lucency is identified. The bilateral mandibular molars are absent. IMPRESSION: Due to motion and difficulty with this exam, dental detail is better on the 02/01/2016 Neck CTA. Maxillary dentition is absent. No definite acute mandible dental inflammation. Electronically Signed   By: Genevie Ann M.D.   On: 04/15/2016 07:41   Dg Chest 2 View  04/15/2016  CLINICAL DATA:  CHF due to valvular disease. EXAM: CHEST  2 VIEW COMPARISON:  04/14/2016  FINDINGS: Mild progression of diffuse bilateral airspace disease consistent with edema. Right lower lobe volume loss and right effusion unchanged. No significant effusion on the left. Right jugular central venous catheter tip in the SVC unchanged. IMPRESSION: Diffuse bilateral pulmonary edema with mild progression Right lower lobe volume loss and small right effusion unchanged. Electronically Signed   By: Franchot Gallo M.D.   On: 04/15/2016 07:38   Dg Chest Port 1 View  04/14/2016  CLINICAL DATA:  Post PICC line placement. EXAM: PORTABLE CHEST 1 VIEW COMPARISON:  02/12/2016 FINDINGS: Examination demonstrates interval placement of right central venous catheter with tip over the SVC. No evidence of pneumothorax. The Lungs are hypoinflated with moderate perihilar bibasilar opacification likely interstitial edema, although cannot exclude infection. Likely small right  pleural effusion. Stable cardiomegaly. There is calcified plaque over the aortic arch. Remainder of the exam is unchanged. IMPRESSION: Moderate perihilar and bibasilar opacification which may be due to interstitial edema versus infection. Small right pleural effusion. Stable cardiomegaly. Right IJ central venous catheter with tip over the SVC. No pneumothorax. Electronically Signed   By: Marin Olp M.D.   On: 04/14/2016 22:02    ASSESSMENT AND PLAN  1. Acute on chronic diastolic HF  - still massively fluid overloaded. HF service plan to check CVP and Co-ox via PICC line.   - continue IV lasix, she is -5 L. Weight down from 260 down to 245 lbs.  2. Severe mitral regurg with infective endocarditis  - initially admitted in 01/2016 with splenic infarct, placed on Xarelto. Later came back with embolic CVA, confirmed mitral valve vegetation. Seen by Dr. Roxy Manns - felt to be a poor candidate for surgery in her current state. Neurology recommend delay surgery until at least Sept (6 months following CVA/SAH)  - IV rocephin. Per Dr. Roxy Manns, will optimize her volume status, later may consider L&RHC and dental consult before high risk surgery.   - last echo 03/31/2016 normal EF, severe MR with highly mobile echodensity attached to the posterior mitral valve leaflet measuring 67mm in diameter, when compare to prior study from 3/21, there might be slight increase in size.  - question if we should get ID on board again since she is in the hospital and her last Echo shows enlarging mitral valve vegetation, unclear if getting a repeat limited echo +/- definity will help. Discussed with Dr. Gwenlyn Found, will hold off on getting ID consult as she is afebrile  3. 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 HTN  5. HLD  6. DM II  7. Anxiety: consulted psychiatry on 5/25 who started her on 0.5mg  BID klonipin with 0.25mg  TID PRN klonipin and lexapro and gabapentin. She is still awake but sluggish, denies any  anxiety, if mental status deteriate, may need to cut back   8. Leg pain: appears to have some degree of cellulitis, since pain is occuring on both side, doubt emboli, no discoloration of toes on exam. Difficult to assess pulse due to significant LE edema. Will continue to monitor  Signed, Almyra Deforest PA-C Pager: [phone]  Agree with note by Almyra Deforest PA-C  Pt continues to diurese on IV lasix. I/O neg 5 liters but still massively volume overloaded. On IV ATBX for SBE. HF service to assist in her care as well. TCTS aware and left a note 2 days ago.  Lorretta Harp, M.D., [place], [facility], [place] [address] [place], [place]  [address]  [phone]  04/17/2016 9:27 AM

## 2016-04-18 LAB — GLUCOSE, CAPILLARY
Glucose-Capillary: 122 mg/dL — ABNORMAL HIGH (ref 65–99)
Glucose-Capillary: 125 mg/dL — ABNORMAL HIGH (ref 65–99)
Glucose-Capillary: 123 mg/dL — ABNORMAL HIGH (ref 65–99)
Glucose-Capillary: 106 mg/dL — ABNORMAL HIGH (ref 65–99)

## 2016-04-18 LAB — CARBOXYHEMOGLOBIN
Carboxyhemoglobin: 1.2 % (ref 0.5–1.5)
Methemoglobin: 0.9 % (ref 0.0–1.5)
O2 Saturation: 61 %
Total hemoglobin: 12.1 g/dL (ref 12.0–16.0)

## 2016-04-18 MED ORDER — CLONAZEPAM 0.5 MG PO TABS
0.5000 mg | ORAL_TABLET | Freq: Three times a day (TID) | ORAL | Status: DC | PRN
  Administered 2016-04-19 – 2016-04-28 (×9): 0.5 mg via ORAL
  Filled 2016-04-18 (×12): qty 1

## 2016-04-18 NOTE — Progress Notes (Signed)
Pt. Refuses to lay in bed.  Will only dangle on side.  I have educated her of the risk of falling but patient screams at me and shakes the bed rail. She is dangling on the side of the bed and the bed alarm is on. I will watch her closely.  She is able to make her own decisions and will not listen to my advice.

## 2016-04-18 NOTE — Clinical Social Work Note (Signed)
Clinical Social Work Assessment  Patient Details  Name: Margaret [name] MRN: [id number] Date of Birth: 10-Mar-1956  Date of referral:  04/18/16               Reason for consult:  Facility Placement, Discharge Planning                Permission sought to share information with:  Facility Sport and exercise psychologist, Family Supports Permission granted to share information::  Yes, Verbal Permission Granted  Name::     Gwyndolyn Saxon  Agency::     Relationship::  Spouse  Contact Information:  [phone]  Housing/Transportation Living arrangements for the past 2 months:  [place] of Information:  Patient Patient Interpreter Needed:  None Criminal Activity/Legal Involvement Pertinent to Current Situation/Hospitalization:  No - Comment as needed Significant Relationships:  Spouse Lives with:  Spouse Do you feel safe going back to the place where you live?  Yes Need for family participation in patient care:  Yes (Comment)  Care giving concerns:  PT recommending SNF but Medicaid pending. Medicaid will not pay for PT, she will not receive check if she goes to SNF, and she will have to be at SNF a minimum of 30 days.   Social Worker assessment / plan:  CSW met with patient. No supports at bedside. CSW introduced role and explained that discharge planning would be discussed. Patient unaware of SNF recommendation. Discussed insurance barriers. Patient is unable to private pay for facility. She wants to speak with her husband before making any decisions. No further concerns. CSW encouraged patient to contact CSW as needed. CSW will continue to follow patient for support and facilitate discharge to SNF if that becomes the plan once medically stable.  Employment status:  [employer] information:  Self Pay (Medicaid Pending) PT Recommendations:  [place] / Referral to community resources:  [place]  Patient/Family's Response to care:   Patient wants to discuss discharge options with her husband before making a decision. Patient's husband supportive and involved in her care. Patient polite and appreciated social work intervention.  Patient/Family's Understanding of and Emotional Response to Diagnosis, Current Treatment, and Prognosis:  Patient and her husband knowledgeable of medical interventions and aware of possibility for SNF once medically stable for discharge.  Emotional Assessment Appearance:  Appears stated age Attitude/Demeanor/Rapport:  Other (Tired) Affect (typically observed):  Appropriate, Calm Orientation:  Oriented to Self, Oriented to Place, Oriented to  Time, Oriented to Situation Alcohol / Substance use:  Tobacco Use Psych involvement (Current and /or in the community):  No (Comment)  Discharge Needs  Concerns to be addressed:  Care Coordination Readmission within the last 30 days:  No Current discharge risk:  Dependent with Mobility, Psychiatric Illness Barriers to Discharge:  Inadequate or no insurance   Candie Chroman, LCSW 04/18/2016, 11:48 AM

## 2016-04-18 NOTE — Progress Notes (Signed)
Primary cardiologist: Dr. Ena Dawley  Seen for followup: Diastolic heart failure, SBE with mitral regurgitation  Subjective:    Somnolent but wakes up to voice. Continues to complain of swelling in her legs. No chest pain or shortness of breath at rest.  Objective:   Temp:  [97.4 F (36.3 C)-98 F (36.7 C)] 97.4 F (36.3 C) (05/27 0827) Pulse Rate:  [63-77] 64 (05/27 0827) Resp:  [16-22] 16 (05/27 0827) BP: (101-127)/(42-98) 122/55 mmHg (05/27 0827) SpO2:  [93 %-100 %] 98 % (05/27 0827) Weight:  [240 lb 4.8 oz (109 kg)] 240 lb 4.8 oz (109 kg) (05/27 0400) Last BM Date: 04/16/16  Filed Weights   04/16/16 0431 04/17/16 0400 04/18/16 0400  Weight: 223 lb 8.7 oz (101.4 kg) 245 lb 13 oz (111.5 kg) 240 lb 4.8 oz (109 kg)    Intake/Output Summary (Last 24 hours) at 04/18/16 0929 Last data filed at 04/18/16 0927  Gross per 24 hour  Intake    910 ml  Output   2550 ml  Net  -1640 ml    Telemetry: Sinus rhythm.  Exam:  General: Obese woman, chronically ill-appearing, no distress.  HEENT: Poor dentition.  Lungs: Crease breath sounds without wheezing.  Cardiac: RRR with apical systolic murmur.  Abdomen: Protuberant.  Extremities: Right appearing edema, firm with erythema.  Lab Results:  Basic Metabolic Panel:  Recent Labs Lab 04/15/16 0320 04/16/16 0329 04/17/16 0325  NA 137 135 136  K 4.0 4.1 4.1  CL 93* 89* 89*  CO2 36* 36* 40*  GLUCOSE 118* 95 116*  BUN 8 16 18   CREATININE 0.72 0.87 0.81  CALCIUM 10.7* 10.9* 10.8*  MG 2.2  --   --     Liver Function Tests:  Recent Labs Lab 04/14/16 1840  AST 20  ALT 14  ALKPHOS 75  BILITOT 0.3  PROT 7.3  ALBUMIN 3.0*    CBC:  Recent Labs Lab 04/14/16 1840  WBC 13.4*  HGB 11.4*  HCT 39.0  MCV 89.4  PLT 596*    Coagulation:  Recent Labs Lab 04/14/16 1840  INR 1.12   Echocardiogram 03/31/2016: Study Conclusions  - Left ventricle: The cavity size was normal. There was moderate   concentric hypertrophy. Systolic function was normal. The  estimated ejection fraction was in the range of 60% to 65%. Wall  motion was normal; there were no regional wall motion  abnormalities. Features are consistent with a pseudonormal left  ventricular filling pattern, with concomitant abnormal relaxation  and increased filling pressure (grade 2 diastolic dysfunction).  Doppler parameters are consistent with elevated ventricular  end-diastolic filling pressure. - Aortic valve: Trileaflet; normal thickness leaflets. There was no  regurgitation. - Aortic root: The aortic root was normal in size. - Mitral valve: Moderately thickened, moderately calcified leaflets  . There was severe regurgitation directed centrally and  anteriorly. - Left atrium: The atrium was severely dilated. - Right ventricle: The cavity size was normal. Wall thickness was  normal. Systolic function was normal. - Right atrium: The atrium was mildly dilated. - Pulmonic valve: There was mild regurgitation. - Pulmonary arteries: Systolic pressure was within the normal  range. - Inferior vena cava: The vessel was normal in size.  Impressions:  - There is a highly mobile echodensity attached to the posterior  mitral valve leaflet measuring 24 mm in its longest diameter.  This is most probably consistent with a vegetation.  When compared to the prior study from 02/11/2016 this might be  slightly increased in size.  Mitral regurgitation is severe.  Normal RVSP.   Medications:   Scheduled Medications: . albuterol  2.5 mg Nebulization QHS  . antiseptic oral rinse  7 mL Mouth Rinse BID  . aspirin  81 mg Oral Daily  . atenolol  25 mg Oral BID  . cefTRIAXone (ROCEPHIN)  IV  2 g Intravenous Q24H  . cholecalciferol  1,000 Units Oral BID  . clonazePAM  0.5 mg Oral BID  . enoxaparin (LOVENOX) injection  40 mg Subcutaneous Q24H  . escitalopram  5 mg Oral QHS  . feeding supplement (ENSURE ENLIVE)   237 mL Oral BID BM  . furosemide  80 mg Intravenous BID  . gabapentin  100 mg Oral BID  . insulin aspart  0-20 Units Subcutaneous TID WC  . insulin aspart  0-5 Units Subcutaneous QHS  . insulin aspart protamine- aspart  10 Units Subcutaneous BID WC  . linagliptin  5 mg Oral Daily  . metFORMIN  1,000 mg Oral BID WC  . polyethylene glycol  17 g Oral Daily  . potassium chloride  20 mEq Oral BID  . pravastatin  10 mg Oral q1800  . sodium chloride flush  10-40 mL Intracatheter Q12H  . sodium chloride flush  3 mL Intravenous Q12H  . spironolactone  50 mg Oral Daily     PRN Medications:  sodium chloride, acetaminophen, albuterol, clonazePAM, nitroGLYCERIN, ondansetron (ZOFRAN) IV, senna, sodium chloride, sodium chloride flush, sodium chloride flush, traMADol   Assessment:   1. Acute on chronic diastolic heart failure. Diuresed 1600 cc last 24 hours. Weight down to 240 pounds from 260 pounds. Continues on Lasix 80 mg IV twice a day. Co-ox recently in the 73s.  2. 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 Initially admitted in 01/2016 with splenic infarct, placed on Xarelto. Later came back with embolic CVA, confirmed mitral valve vegetation. Seen by Dr. Roxy Manns - felt to be a poor candidate for surgery in her current state. Neurology recommend delay surgery until at least Sept (6 months following CVA/SAH). She is on IV rocephin. Per Dr. Roxy Manns, will optimize her volume status, later may consider L&RHC and dental consult before high risk surgery. She was recently seen by the Infectious Disease service.  3. 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 Essential hypertension.  5. Hyperlipidemia, on statin therapy.  6. DM II  7. Anxiety: consulted psychiatry on 5/25 who started her on 0.5mg  BID klonipin with 0.25mg  TID PRN klonipin and lexapro and gabapentin.   Plan/Discussion:    Continue current cardiac regimen including Lasix at 80 mg twice  daily. She continues to diurese and renal function has been stable. Appreciate consultants and recommendations in her care. Overall complex situation, she is not ready as yet to undergo further invasive cardiac testing in preparation for valve surgery. Most recent surveillance blood cultures from 5/24 negative for growth.   Satira Sark, M.D., F.A.C.C.

## 2016-04-19 LAB — GLUCOSE, CAPILLARY
Glucose-Capillary: 134 mg/dL — ABNORMAL HIGH (ref 65–99)
Glucose-Capillary: 97 mg/dL (ref 65–99)
Glucose-Capillary: 101 mg/dL — ABNORMAL HIGH (ref 65–99)

## 2016-04-19 LAB — CBC
HCT: 39.8 % (ref 36.0–46.0)
Hemoglobin: 11.3 g/dL — ABNORMAL LOW (ref 12.0–15.0)
MCH: 25.4 pg — AB (ref 26.0–34.0)
MCHC: 28.4 g/dL — AB (ref 30.0–36.0)
MCV: 89.4 fL (ref 78.0–100.0)
Platelets: 648 10*3/uL — ABNORMAL HIGH (ref 150–400)
RBC: 4.45 MIL/uL (ref 3.87–5.11)
RDW: 16 % — AB (ref 11.5–15.5)
WBC: 14.2 10*3/uL — ABNORMAL HIGH (ref 4.0–10.5)

## 2016-04-19 LAB — BASIC METABOLIC PANEL
Anion gap: 10 (ref 5–15)
BUN: 20 mg/dL (ref 6–20)
CALCIUM: 10.7 mg/dL — AB (ref 8.9–10.3)
CO2: 38 mmol/L — AB (ref 22–32)
CREATININE: 0.78 mg/dL (ref 0.44–1.00)
Chloride: 87 mmol/L — ABNORMAL LOW (ref 101–111)
GFR calc non Af Amer: >60 mL/min (ref >60)
Glucose, Bld: 170 mg/dL — ABNORMAL HIGH (ref 65–99)
Potassium: 4.1 mmol/L (ref 3.5–5.1)
SODIUM: 135 mmol/L (ref 135–145)

## 2016-04-19 LAB — CARBOXYHEMOGLOBIN
Carboxyhemoglobin: 1.2 % (ref 0.5–1.5)
Methemoglobin: 0.8 % (ref 0.0–1.5)
O2 Saturation: 62.4 %
Total hemoglobin: 11.7 g/dL — ABNORMAL LOW (ref 12.0–16.0)

## 2016-04-19 NOTE — Progress Notes (Signed)
Primary cardiologist: Dr. Ena Dawley  Seen for followup: Diastolic heart failure, SBE with mitral regurgitation  Subjective:    Up in chair this morning eating breakfast. No chest pain. No breathlessness at rest.  Objective:   Temp:  [97.5 F (36.4 C)-98.8 F (37.1 C)] 97.8 F (36.6 C) (05/28 0815) Pulse Rate:  [63-76] 72 (05/28 0815) Resp:  [17-27] 24 (05/28 0815) BP: (114-126)/(53-98) 114/62 mmHg (05/28 0815) SpO2:  [94 %-100 %] 95 % (05/28 0815) Weight:  [243 lb (110.224 kg)] 243 lb (110.224 kg) (05/28 0815) Last BM Date: 04/15/16  Filed Weights   04/17/16 0400 04/18/16 0400 04/19/16 0815  Weight: 245 lb 13 oz (111.5 kg) 240 lb 4.8 oz (109 kg) 243 lb (110.224 kg)    Intake/Output Summary (Last 24 hours) at 04/19/16 0935 Last data filed at 04/19/16 0900  Gross per 24 hour  Intake    510 ml  Output   1925 ml  Net  -1415 ml    Telemetry: Sinus rhythm.  Exam:  General: Obese woman, chronically ill-appearing, no distress.  HEENT: Poor dentition.  Lungs: Crease breath sounds without wheezing.  Cardiac: RRR with apical systolic murmur.  Abdomen: Protuberant.  Extremities: Bilateral leg edema, mild erythema.  Lab Results:  Basic Metabolic Panel:  Recent Labs Lab 04/15/16 0320 04/16/16 0329 04/17/16 0325 04/19/16 0335  NA 137 135 136 135  K 4.0 4.1 4.1 4.1  CL 93* 89* 89* 87*  CO2 36* 36* 40* 38*  GLUCOSE 118* 95 116* 170*  BUN 8 16 18 20   CREATININE 0.72 0.87 0.81 0.78  CALCIUM 10.7* 10.9* 10.8* 10.7*  MG 2.2  --   --   --     Liver Function Tests:  Recent Labs Lab 04/14/16 1840  AST 20  ALT 14  ALKPHOS 75  BILITOT 0.3  PROT 7.3  ALBUMIN 3.0*    CBC:  Recent Labs Lab 04/14/16 1840 04/19/16 0335  WBC 13.4* 14.2*  HGB 11.4* 11.3*  HCT 39.0 39.8  MCV 89.4 89.4  PLT 596* 648*    Coagulation:  Recent Labs Lab 04/14/16 1840  INR 1.12   Echocardiogram 03/31/2016: Study Conclusions  - Left ventricle: The cavity  size was normal. There was moderate  concentric hypertrophy. Systolic function was normal. The  estimated ejection fraction was in the range of 60% to 65%. Wall  motion was normal; there were no regional wall motion  abnormalities. Features are consistent with a pseudonormal left  ventricular filling pattern, with concomitant abnormal relaxation  and increased filling pressure (grade 2 diastolic dysfunction).  Doppler parameters are consistent with elevated ventricular  end-diastolic filling pressure. - Aortic valve: Trileaflet; normal thickness leaflets. There was no  regurgitation. - Aortic root: The aortic root was normal in size. - Mitral valve: Moderately thickened, moderately calcified leaflets  . There was severe regurgitation directed centrally and  anteriorly. - Left atrium: The atrium was severely dilated. - Right ventricle: The cavity size was normal. Wall thickness was  normal. Systolic function was normal. - Right atrium: The atrium was mildly dilated. - Pulmonic valve: There was mild regurgitation. - Pulmonary arteries: Systolic pressure was within the normal  range. - Inferior vena cava: The vessel was normal in size.  Impressions:  - There is a highly mobile echodensity attached to the posterior  mitral valve leaflet measuring 24 mm in its longest diameter.  This is most probably consistent with a vegetation.  When compared to the prior study from 02/11/2016  this might be  slightly increased in size.  Mitral regurgitation is severe.  Normal RVSP.   Medications:   Scheduled Medications: . albuterol  2.5 mg Nebulization QHS  . antiseptic oral rinse  7 mL Mouth Rinse BID  . aspirin  81 mg Oral Daily  . atenolol  25 mg Oral BID  . cefTRIAXone (ROCEPHIN)  IV  2 g Intravenous Q24H  . cholecalciferol  1,000 Units Oral BID  . clonazePAM  0.5 mg Oral BID  . enoxaparin (LOVENOX) injection  40 mg Subcutaneous Q24H  . escitalopram  5 mg Oral QHS    . feeding supplement (ENSURE ENLIVE)  237 mL Oral BID BM  . furosemide  80 mg Intravenous BID  . gabapentin  100 mg Oral BID  . insulin aspart  0-20 Units Subcutaneous TID WC  . insulin aspart  0-5 Units Subcutaneous QHS  . insulin aspart protamine- aspart  10 Units Subcutaneous BID WC  . linagliptin  5 mg Oral Daily  . metFORMIN  1,000 mg Oral BID WC  . polyethylene glycol  17 g Oral Daily  . potassium chloride  20 mEq Oral BID  . pravastatin  10 mg Oral q1800  . sodium chloride flush  10-40 mL Intracatheter Q12H  . sodium chloride flush  3 mL Intravenous Q12H  . spironolactone  50 mg Oral Daily    PRN Medications: sodium chloride, acetaminophen, albuterol, clonazePAM, nitroGLYCERIN, ondansetron (ZOFRAN) IV, senna, sodium chloride, sodium chloride flush, sodium chloride flush, traMADol   Assessment:   1. Acute on chronic diastolic heart failure. Diuresed additional 1300 cc last 24 hours. Weight 243 pounds from 260 pounds. Continues on Lasix 80 mg IV twice a day. Co-ox recently 51.  2. 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 Initially admitted in 01/2016 with splenic infarct, placed on Xarelto. Later came back with embolic CVA, confirmed mitral valve vegetation. Seen by Dr. Roxy Manns - felt to be a poor candidate for surgery in her current state. Neurology recommend delay surgery until at least Sept (6 months following CVA/SAH). She is on IV rocephin. Per Dr. Roxy Manns, will optimize her volume status, later may consider L&RHC and dental consult before high risk surgery. She was recently seen by the Infectious Disease service.  3. 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 Essential hypertension.  5. Hyperlipidemia, on statin therapy.  6. DM II  7. Anxiety: consulted psychiatry on 5/25 who started her on 0.5mg  BID klonipin with 0.25mg  TID PRN klonipin and lexapro and gabapentin.   Plan/Discussion:    Continue current cardiac regimen  including Lasix at 80 mg twice daily. She is diuresing with stable creatinine. Appreciate consultants and recommendations in her care. Overall complex situation, she is not ready as yet to undergo further invasive cardiac testing in preparation for valve surgery. Most recent surveillance blood cultures from 5/24 negative for growth.  Satira Sark, M.D., F.A.C.C.

## 2016-04-20 DIAGNOSIS — F410 Panic disorder [episodic paroxysmal anxiety] without agoraphobia: Secondary | ICD-10-CM

## 2016-04-20 DIAGNOSIS — G4700 Insomnia, unspecified: Secondary | ICD-10-CM

## 2016-04-20 LAB — CULTURE, BLOOD (ROUTINE X 2)
CULTURE: NO GROWTH
Culture: NO GROWTH

## 2016-04-20 LAB — GLUCOSE, CAPILLARY
Glucose-Capillary: 112 mg/dL — ABNORMAL HIGH (ref 65–99)
Glucose-Capillary: 124 mg/dL — ABNORMAL HIGH (ref 65–99)
Glucose-Capillary: 124 mg/dL — ABNORMAL HIGH (ref 65–99)

## 2016-04-20 LAB — CBC
HEMATOCRIT: 38.9 % (ref 36.0–46.0)
HEMOGLOBIN: 11 g/dL — AB (ref 12.0–15.0)
MCH: 25.1 pg — AB (ref 26.0–34.0)
MCHC: 28.3 g/dL — AB (ref 30.0–36.0)
MCV: 88.8 fL (ref 78.0–100.0)
Platelets: 651 10*3/uL — ABNORMAL HIGH (ref 150–400)
RBC: 4.38 MIL/uL (ref 3.87–5.11)
RDW: 16.1 % — AB (ref 11.5–15.5)
WBC: 14.6 10*3/uL — ABNORMAL HIGH (ref 4.0–10.5)

## 2016-04-20 LAB — BASIC METABOLIC PANEL
ANION GAP: 10 (ref 5–15)
BUN: 18 mg/dL (ref 6–20)
CHLORIDE: 89 mmol/L — AB (ref 101–111)
CO2: 36 mmol/L — AB (ref 22–32)
CREATININE: 0.79 mg/dL (ref 0.44–1.00)
Calcium: 10.6 mg/dL — ABNORMAL HIGH (ref 8.9–10.3)
GFR calc non Af Amer: >60 mL/min (ref >60)
Glucose, Bld: 145 mg/dL — ABNORMAL HIGH (ref 65–99)
POTASSIUM: 4.3 mmol/L (ref 3.5–5.1)
SODIUM: 135 mmol/L (ref 135–145)

## 2016-04-20 LAB — CARBOXYHEMOGLOBIN
Carboxyhemoglobin: 1.2 % (ref 0.5–1.5)
Methemoglobin: 0.6 % (ref 0.0–1.5)
O2 Saturation: 65.6 %
Total hemoglobin: 11.7 g/dL — ABNORMAL LOW (ref 12.0–16.0)

## 2016-04-20 MED ORDER — FUROSEMIDE 10 MG/ML IJ SOLN
80.0000 mg | Freq: Three times a day (TID) | INTRAMUSCULAR | Status: DC
  Administered 2016-04-20 – 2016-04-22 (×6): 80 mg via INTRAVENOUS
  Filled 2016-04-20 (×6): qty 8

## 2016-04-20 MED ORDER — ESCITALOPRAM OXALATE 10 MG PO TABS
10.0000 mg | ORAL_TABLET | Freq: Every day | ORAL | Status: DC
  Administered 2016-04-20 – 2016-04-29 (×9): 10 mg via ORAL
  Filled 2016-04-20 (×10): qty 1

## 2016-04-20 MED ORDER — TRAZODONE HCL 50 MG PO TABS
50.0000 mg | ORAL_TABLET | Freq: Every evening | ORAL | Status: DC | PRN
  Administered 2016-04-20 – 2016-04-29 (×7): 50 mg via ORAL
  Filled 2016-04-20 (×9): qty 1

## 2016-04-20 MED ORDER — POTASSIUM CHLORIDE CRYS ER 20 MEQ PO TBCR
20.0000 meq | EXTENDED_RELEASE_TABLET | Freq: Three times a day (TID) | ORAL | Status: DC
  Administered 2016-04-20 – 2016-04-23 (×9): 20 meq via ORAL
  Filled 2016-04-20 (×10): qty 1

## 2016-04-20 MED ORDER — GABAPENTIN 100 MG PO CAPS
200.0000 mg | ORAL_CAPSULE | Freq: Three times a day (TID) | ORAL | Status: DC
  Administered 2016-04-20 – 2016-04-29 (×28): 200 mg via ORAL
  Filled 2016-04-20 (×28): qty 2

## 2016-04-20 NOTE — Progress Notes (Signed)
Primary cardiologist: Dr. Ena Dawley  Seen for followup: Diastolic heart failure, SBE with mitral regurgitation  Subjective:    Up in chair this morning, very anxious and upset as she feels exhausted, unable to sleep.   Objective:   Temp:  [89.5 F (31.9 C)-97.7 F (36.5 C)] 97.4 F (36.3 C) (05/29 0750) Pulse Rate:  [60-81] 60 (05/29 0751) Resp:  [16-28] 24 (05/29 0751) BP: (88-117)/(50-74) 106/57 mmHg (05/29 0751) SpO2:  [92 %-100 %] 93 % (05/29 0751) Weight:  [245 lb 9.5 oz (111.4 kg)] 245 lb 9.5 oz (111.4 kg) (05/29 0500) Last BM Date: 04/15/16  Filed Weights   04/18/16 0400 04/19/16 0815 04/20/16 0500  Weight: 240 lb 4.8 oz (109 kg) 243 lb (110.224 kg) 245 lb 9.5 oz (111.4 kg)    Intake/Output Summary (Last 24 hours) at 04/20/16 0851 Last data filed at 04/20/16 0800  Gross per 24 hour  Intake    350 ml  Output   1025 ml  Net   -675 ml    Telemetry: Sinus rhythm.  Exam:  General: Obese woman, chronically ill-appearing, appears anxious, repetitive jerking movements with her legs.   HEENT: Poor dentition.  Lungs: decreased breathing sounds right> left with crackles.  Cardiac: RRR with apical systolic murmur.  Abdomen: Protuberant.  Extremities: Bilateral leg edema, mild erythema.  Lab Results:  Basic Metabolic Panel:  Recent Labs Lab 04/15/16 0320  04/17/16 0325 04/19/16 0335 04/20/16 0420  NA 137  < > 136 135 135  K 4.0  < > 4.1 4.1 4.3  CL 93*  < > 89* 87* 89*  CO2 36*  < > 40* 38* 36*  GLUCOSE 118*  < > 116* 170* 145*  BUN 8  < > 18 20 18   CREATININE 0.72  < > 0.81 0.78 0.79  CALCIUM 10.7*  < > 10.8* 10.7* 10.6*  MG 2.2  --   --   --   --   < > = values in this interval not displayed.  Liver Function Tests:  Recent Labs Lab 04/14/16 1840  AST 20  ALT 14  ALKPHOS 75  BILITOT 0.3  PROT 7.3  ALBUMIN 3.0*    CBC:  Recent Labs Lab 04/14/16 1840 04/19/16 0335 04/20/16 0420  WBC 13.4* 14.2* 14.6*  HGB 11.4* 11.3*  11.0*  HCT 39.0 39.8 38.9  MCV 89.4 89.4 88.8  PLT 596* 648* 651*    Coagulation:  Recent Labs Lab 04/14/16 1840  INR 1.12   Echocardiogram 03/31/2016: Study Conclusions  - Left ventricle: The cavity size was normal. There was moderate  concentric hypertrophy. Systolic function was normal. The  estimated ejection fraction was in the range of 60% to 65%. Wall  motion was normal; there were no regional wall motion  abnormalities. Features are consistent with a pseudonormal left  ventricular filling pattern, with concomitant abnormal relaxation  and increased filling pressure (grade 2 diastolic dysfunction).  Doppler parameters are consistent with elevated ventricular  end-diastolic filling pressure. - Aortic valve: Trileaflet; normal thickness leaflets. There was no  regurgitation. - Aortic root: The aortic root was normal in size. - Mitral valve: Moderately thickened, moderately calcified leaflets  . There was severe regurgitation directed centrally and  anteriorly. - Left atrium: The atrium was severely dilated. - Right ventricle: The cavity size was normal. Wall thickness was  normal. Systolic function was normal. - Right atrium: The atrium was mildly dilated. - Pulmonic valve: There was mild regurgitation. - Pulmonary arteries: Systolic  pressure was within the normal  range. - Inferior vena cava: The vessel was normal in size.  Impressions:  - There is a highly mobile echodensity attached to the posterior  mitral valve leaflet measuring 24 mm in its longest diameter.  This is most probably consistent with a vegetation.  When compared to the prior study from 02/11/2016 this might be  slightly increased in size.  Mitral regurgitation is severe.  Normal RVSP.   Medications:   Scheduled Medications: . albuterol  2.5 mg Nebulization QHS  . antiseptic oral rinse  7 mL Mouth Rinse BID  . aspirin  81 mg Oral Daily  . atenolol  25 mg Oral BID  .  cefTRIAXone (ROCEPHIN)  IV  2 g Intravenous Q24H  . cholecalciferol  1,000 Units Oral BID  . clonazePAM  0.5 mg Oral BID  . enoxaparin (LOVENOX) injection  40 mg Subcutaneous Q24H  . escitalopram  5 mg Oral QHS  . feeding supplement (ENSURE ENLIVE)  237 mL Oral BID BM  . furosemide  80 mg Intravenous BID  . gabapentin  100 mg Oral BID  . insulin aspart  0-20 Units Subcutaneous TID WC  . insulin aspart  0-5 Units Subcutaneous QHS  . insulin aspart protamine- aspart  10 Units Subcutaneous BID WC  . linagliptin  5 mg Oral Daily  . metFORMIN  1,000 mg Oral BID WC  . polyethylene glycol  17 g Oral Daily  . potassium chloride  20 mEq Oral BID  . pravastatin  10 mg Oral q1800  . sodium chloride flush  10-40 mL Intracatheter Q12H  . spironolactone  50 mg Oral Daily    PRN Medications: sodium chloride, acetaminophen, albuterol, clonazePAM, nitroGLYCERIN, ondansetron (ZOFRAN) IV, senna, sodium chloride, sodium chloride flush, sodium chloride flush, traMADol   Assessment/Plan:   1. Acute on chronic diastolic heart failure. Diuresed additional 675 cc last 24 hours. Weight 244 pounds from 260 pounds, still fluid overloaded, Crea normal, I will increase lasix to 80 mg iv q8h for the next 48 hours. Increase KCl. Co-ox recently 12.  2. 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 Initially admitted in 01/2016 with splenic infarct, placed on Xarelto. Later came back with embolic CVA, confirmed mitral valve vegetation. Seen by Dr. Roxy Manns - felt to be a poor candidate for surgery in her current state. Neurology recommend delay surgery until at least Sept (6 months following CVA/SAH). She is on IV rocephin. Per Dr. Roxy Manns, will optimize her volume status, later may consider L&RHC and dental consult before high risk surgery. She was recently seen by the Infectious Disease service. Overall complex situation, she is not ready as yet to undergo further invasive cardiac testing in  preparation for valve surgery. Most recent surveillance blood cultures from 5/24 negative for growth.   3. 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 Essential hypertension.  5. Hyperlipidemia, on statin therapy.  6. DM II  7. Anxiety: consulted psychiatry on 5/25 who started her on 0.5mg  BID klonipin with 0.25mg  TID PRN klonipin and lexapro and gabapentin. She is unable to sleep, we will ask psychaitry for help as it seems that anxiolytics are not helping and causing insomnia.  We will start PT& OT  Ena Dawley 04/20/2016

## 2016-04-20 NOTE — Consult Note (Addendum)
[facility] Face-to-Face Psychiatry Consult Follow up  Reason for Consult: Excessive anxiety, depression and endocarditis of mitral valve. Referring physician: Dr. Meda Coffee  Patient Identification: Margaret [name] MRN:  [id number] Principal Diagnosis: Other depression due to general medical condition Diagnosis:   Patient Active Problem List   Diagnosis Date Noted  . 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 Panic disorder without agoraphobia with severe panic attacks [F41.0]   . Acute on chronic diastolic congestive heart failure due to valvular disease ([place]) [I50.33, I38] 04/14/2016  . CHF due to valvular disease, acute on chronic, diastolic [O75.64, P32] 95/18/8416  . Chronic diastolic congestive heart failure due to valvular disease ([place]) [I50.32, I38] 04/13/2016  . History of stroke [Z86.73] 04/13/2016  . Cerebrovascular accident (CVA) due to occlusion of right carotid artery ([place]) [I63.231] 03/25/2016  . Splenic infarct [D73.5] 03/25/2016  . Type 2 diabetes mellitus with circulatory disorder ([place]) [E11.59] 03/25/2016  . Mitral regurgitation [I34.0] 03/06/2016  . HLD (hyperlipidemia) [E78.5] 03/06/2016  . Right carotid artery occlusion [I65.21]   . Epigastric abdominal tenderness on direct palpation [R10.816]   . 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 Positive ANA (antinuclear antibody) [R76.8]   . Streptococcus viridans infection [B95.4] 02/04/2016  . Cerebrovascular accident (CVA) due to embolism of cerebral artery ([place]) [I63.40]   . Subarachnoid hemorrhage ([place]) [I60.9] 02/01/2016  . Stroke [facility]) [I63.9]   . Visual changes [H53.9]   . Pulmonary infiltrates [R91.8] 01/23/2016  . Pulmonary hypertension assoc with unclear multi-factorial mechanisms ([place]) [I27.2] 01/23/2016  . Splenic infarction [D73.5] 01/14/2016  . Insulin dependent diabetes mellitus ([place]) [E11.9, Z79.4] 01/14/2016  . Essential hypertension [I10] 01/14/2016  . Hepatic cirrhosis ([place])  [K74.60] 01/14/2016  . COPD (chronic obstructive pulmonary disease) ([place]) [J44.9] 04/27/2012  . Chronic diastolic CHF (congestive heart failure) ([place]) [I50.32] 04/27/2012  . Morbid obesity ([place]) [E66.01] 04/27/2012  . Hypoxia [R09.02] 04/27/2012  . Dyspnea on exertion [R06.09] 04/27/2012  . Smoker [Z72.0] 04/27/2012    Total Time spent with patient: 30 minutes  Subjective:   Margaret [name] is a 60 y.o. female patient admitted with endocarditis and depression.  HPI:  [name] is a 60 y.o. female seen, chart reviewed and case discussed with staff RN, Dr. Meda Coffee, patient sister and brother who are at bed side. Patient endorses significant panic attacks in middle of night waking up with SOB, chest pain, increased heart rate, startle, and confusion. Her symptoms get worse when no one to help her with medication and calm down her. She has called her sister few times from [place] with excessive anxiety and screaming and yelling which she states has no control on herself. She does not want to take hydrocodone and xanax due to excessive sedation and felt she has no control on her won self. She is satisfied with her clonazepam and willing to give a trail of SSRI  Lexapro and Neurontin for additional help. She is hoping get physically strong with diuresis therapy, and has future plans of scheduled cardiac surgery and renovation of her home. She was working until her stroke few months ago i.e 02/07/2016. She denied family history of mental illness or drug abuse. She has no suicide or homicide ideation, intention or plans. Past Psychiatric History: None reported until her first CVA.   Interval history: Patient complained of feeling overwhelmed because she is not getting comfortable either recliner or bed which results not able to sleep last night. She feels her medications are not helpful  and very anxious this visit and shaking her left leg constantly and provided assurance. She is able to  calm down and willing to take medications as recommended. Patient is awake, alert and oriented to her surroundings and no confusion noted. Staff RN informed to work on changing her bed. Patient does not have panic episodes but feels very anxious instead of taking anxiety medication. Patient may not tolerated higher doses of benzo's as she has SOB. Patient has no known safety concerns.  Risk to Self: Is patient at risk for suicide?: No Risk to Others:   Prior Inpatient Therapy:   Prior Outpatient Therapy:    Past Medical History:  Past Medical History  Diagnosis Date  . Hypertension   . Hyperlipidemia   . COPD (chronic obstructive pulmonary disease) ([place]) 04/20/12  . Asthma   . Type II diabetes mellitus ([place])   . Anemia   . H/O hiatal hernia   . Osteoarthritis   . Pneumonia 04/2012  . Migraines   . Panic attacks 04/20/12  . Morbid obesity ([place])   . Heart murmur   . Bacterial endocarditis     Strep viridans   . Chronic diastolic CHF (congestive heart failure) ([place]) 04/27/2012  . Pulmonary hypertension assoc with unclear multi-factorial mechanisms ([place]) 01/23/2016  . Positive ANA (antinuclear antibody)   . 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 Stroke ([place])   . Subarachnoid bleed ([place]) 02/01/2016    Past Surgical History  Procedure Laterality Date  . Laceration repair  1966    "right upper arm"  . Dilation and curettage of uterus  1990's  . Finger reattachment  1973    "right ring finger"  . Tee without cardioversion N/A 02/05/2016    Procedure: TRANSESOPHAGEAL ECHOCARDIOGRAM (TEE);  Surgeon: Larey Dresser, MD;  Location: [facility];  Service: Cardiovascular;  Laterality: N/A;  . Radiology with anesthesia N/A 02/06/2016    Procedure: RADIOLOGY WITH ANESTHESIA;  Surgeon: Luanne Bras, MD;  Location: [facility];  Service: Radiology;  Laterality: N/A;   Family History:  Family History  Problem Relation Age of Onset  . Allergies    . Hypertension    . Coronary artery disease Mother 50   . Stroke Father   . COPD Father   . Clotting disorder Maternal Grandmother     died from blood clot   Family Psychiatric  History: none reported. Social History:  History  Alcohol Use No     History  Drug Use No    Social History   Social History  . Marital Status: Married    Spouse Name: N/A  . Number of Children: N/A  . Years of Education: N/A   Social History Main Topics  . Smoking status: Former Smoker -- 0.20 packs/day for 40 years    Types: Cigarettes    Quit date: 04/20/2012  . Smokeless tobacco: Never Used     Comment: 6/5//13 "a pack of cigarettes would last me 1 - 1 1/2 wk"  . Alcohol Use: No  . Drug Use: No  . Sexual Activity: Not Currently     Comment: married, 2 children, work with computers   Other Topics Concern  . None   Social History Narrative   Additional Social History:    Allergies:   Allergies  Allergen Reactions  . Codeine Hives, Itching and Other (See Comments)    "breathing problems"  . Latex Itching    Labs:  Results for orders placed or performed during the hospital encounter of 04/14/16 (from  the past 48 hour(s))  Glucose, capillary     Status: Abnormal   Collection Time: 04/18/16  4:47 PM  Result Value Ref Range   Glucose-Capillary 123 (H) 65 - 99 mg/dL   Comment 1 Capillary Specimen   Glucose, capillary     Status: Abnormal   Collection Time: 04/18/16  9:12 PM  Result Value Ref Range   Glucose-Capillary 106 (H) 65 - 99 mg/dL  CBC     Status: Abnormal   Collection Time: 04/19/16  3:35 AM  Result Value Ref Range   WBC 14.2 (H) 4.0 - 10.5 K/uL   RBC 4.45 3.87 - 5.11 MIL/uL   Hemoglobin 11.3 (L) 12.0 - 15.0 g/dL   HCT 39.8 36.0 - 46.0 %   MCV 89.4 78.0 - 100.0 fL   MCH 25.4 (L) 26.0 - 34.0 pg   MCHC 28.4 (L) 30.0 - 36.0 g/dL   RDW 16.0 (H) 11.5 - 15.5 %   Platelets 648 (H) 150 - 400 K/uL  Basic metabolic panel     Status: Abnormal   Collection Time: 04/19/16  3:35 AM  Result Value Ref Range   Sodium 135 135 - 145  mmol/L   Potassium 4.1 3.5 - 5.1 mmol/L   Chloride 87 (L) 101 - 111 mmol/L   CO2 38 (H) 22 - 32 mmol/L   Glucose, Bld 170 (H) 65 - 99 mg/dL   BUN 20 6 - 20 mg/dL   Creatinine, Ser 0.78 0.44 - 1.00 mg/dL   Calcium 10.7 (H) 8.9 - 10.3 mg/dL   GFR calc non Af Amer >60 >60 mL/min   GFR calc Af Amer >60 >60 mL/min    Comment: (NOTE) The eGFR has been calculated using the CKD EPI equation. This calculation has not been validated in all clinical situations. eGFR's persistently <60 mL/min signify possible Chronic Kidney Disease.    Anion gap 10 5 - 15  Carboxyhemoglobin     Status: Abnormal   Collection Time: 04/19/16  3:50 AM  Result Value Ref Range   Total hemoglobin 11.7 (L) 12.0 - 16.0 g/dL   O2 Saturation 62.4 %   Carboxyhemoglobin 1.2 0.5 - 1.5 %   Methemoglobin 0.8 0.0 - 1.5 %  Glucose, capillary     Status: Abnormal   Collection Time: 04/19/16  8:21 AM  Result Value Ref Range   Glucose-Capillary 134 (H) 65 - 99 mg/dL   Comment 1 Capillary Specimen   Glucose, capillary     Status: None   Collection Time: 04/19/16  4:55 PM  Result Value Ref Range   Glucose-Capillary 97 65 - 99 mg/dL  Glucose, capillary     Status: Abnormal   Collection Time: 04/19/16  9:05 PM  Result Value Ref Range   Glucose-Capillary 101 (H) 65 - 99 mg/dL   Comment 1 Capillary Specimen   CBC     Status: Abnormal   Collection Time: 04/20/16  4:20 AM  Result Value Ref Range   WBC 14.6 (H) 4.0 - 10.5 K/uL   RBC 4.38 3.87 - 5.11 MIL/uL   Hemoglobin 11.0 (L) 12.0 - 15.0 g/dL   HCT 38.9 36.0 - 46.0 %   MCV 88.8 78.0 - 100.0 fL   MCH 25.1 (L) 26.0 - 34.0 pg   MCHC 28.3 (L) 30.0 - 36.0 g/dL   RDW 16.1 (H) 11.5 - 15.5 %   Platelets 651 (H) 150 - 400 K/uL  Basic metabolic panel     Status: Abnormal   Collection Time:  04/20/16  4:20 AM  Result Value Ref Range   Sodium 135 135 - 145 mmol/L   Potassium 4.3 3.5 - 5.1 mmol/L   Chloride 89 (L) 101 - 111 mmol/L   CO2 36 (H) 22 - 32 mmol/L   Glucose, Bld 145  (H) 65 - 99 mg/dL   BUN 18 6 - 20 mg/dL   Creatinine, Ser 2.43 0.44 - 1.00 mg/dL   Calcium 83.6 (H) 8.9 - 10.3 mg/dL   GFR calc non Af Amer >60 >60 mL/min   GFR calc Af Amer >60 >60 mL/min    Comment: (NOTE) The eGFR has been calculated using the CKD EPI equation. This calculation has not been validated in all clinical situations. eGFR's persistently <60 mL/min signify possible Chronic Kidney Disease.    Anion gap 10 5 - 15  Carboxyhemoglobin     Status: Abnormal   Collection Time: 04/20/16  4:30 AM  Result Value Ref Range   Total hemoglobin 11.7 (L) 12.0 - 16.0 g/dL   O2 Saturation 54.2 %   Carboxyhemoglobin 1.2 0.5 - 1.5 %   Methemoglobin 0.6 0.0 - 1.5 %  Glucose, capillary     Status: Abnormal   Collection Time: 04/20/16 11:58 AM  Result Value Ref Range   Glucose-Capillary 112 (H) 65 - 99 mg/dL    Current Facility-Administered Medications  Medication Dose Route Frequency Provider Last Rate Last Dose  . 0.9 %  sodium chloride infusion  250 mL Intravenous PRN Beatrice Lecher, PA-C 10 mL/hr at 04/20/16 1400 250 mL at 04/20/16 1400  . acetaminophen (TYLENOL) tablet 650 mg  650 mg Oral Q4H PRN Beatrice Lecher, PA-C      . albuterol (PROVENTIL) (2.5 MG/3ML) 0.083% nebulizer solution 2.5 mg  2.5 mg Inhalation Q4H PRN Beatrice Lecher, PA-C      . albuterol (PROVENTIL) (2.5 MG/3ML) 0.083% nebulizer solution 2.5 mg  2.5 mg Nebulization QHS Beatrice Lecher, PA-C   2.5 mg at 04/19/16 2015  . antiseptic oral rinse (CPC / CETYLPYRIDINIUM CHLORIDE 0.05%) solution 7 mL  7 mL Mouth Rinse BID Lars Masson, MD   7 mL at 04/20/16 1000  . aspirin chewable tablet 81 mg  81 mg Oral Daily Beatrice Lecher, PA-C   81 mg at 04/20/16 7156  . atenolol (TENORMIN) tablet 25 mg  25 mg Oral BID Beatrice Lecher, PA-C   25 mg at 04/20/16 6483  . cefTRIAXone (ROCEPHIN) 2 g in dextrose 5 % 50 mL IVPB  2 g Intravenous Q24H Lars Masson, MD   2 g at 04/19/16 2158  . cholecalciferol (VITAMIN D) tablet 1,000 Units   1,000 Units Oral BID Beatrice Lecher, PA-C   1,000 Units at 04/20/16 0820  . clonazePAM (KLONOPIN) tablet 0.5 mg  0.5 mg Oral BID Leata Mouse, MD   0.5 mg at 04/20/16 0322  . clonazePAM (KLONOPIN) tablet 0.5 mg  0.5 mg Oral TID PRN Greig Castilla Means, MD   0.5 mg at 04/19/16 1649  . enoxaparin (LOVENOX) injection 40 mg  40 mg Subcutaneous Q24H Beatrice Lecher, PA-C   40 mg at 04/19/16 2208  . escitalopram (LEXAPRO) tablet 5 mg  5 mg Oral QHS Leata Mouse, MD   5 mg at 04/19/16 2202  . feeding supplement (ENSURE ENLIVE) (ENSURE ENLIVE) liquid 237 mL  237 mL Oral BID BM Lars Masson, MD   237 mL at 04/20/16 1000  . furosemide (LASIX) injection 80 mg  80 mg  Intravenous Q8H Dorothy Spark, MD      . gabapentin (NEURONTIN) capsule 100 mg  100 mg Oral BID Ambrose Finland, MD   100 mg at 04/20/16 0821  . insulin aspart (novoLOG) injection 0-20 Units  0-20 Units Subcutaneous TID WC Liliane Shi, PA-C   3 Units at 04/20/16 4709  . insulin aspart (novoLOG) injection 0-5 Units  0-5 Units Subcutaneous QHS Liliane Shi, PA-C   0 Units at 04/14/16 2200  . insulin aspart protamine- aspart (NOVOLOG MIX 70/30) injection 10 Units  10 Units Subcutaneous BID WC Liliane Shi, PA-C   10 Units at 04/20/16 0820  . linagliptin (TRADJENTA) tablet 5 mg  5 mg Oral Daily Liliane Shi, PA-C   5 mg at 04/20/16 0820  . metFORMIN (GLUCOPHAGE) tablet 1,000 mg  1,000 mg Oral BID WC Liliane Shi, PA-C   1,000 mg at 04/20/16 0820  . nitroGLYCERIN (NITROSTAT) SL tablet 0.4 mg  0.4 mg Sublingual Q5 Min x 3 PRN Liliane Shi, PA-C      . ondansetron (ZOFRAN) injection 4 mg  4 mg Intravenous Q6H PRN Liliane Shi, PA-C      . polyethylene glycol (MIRALAX / GLYCOLAX) packet 17 g  17 g Oral Daily Liliane Shi, PA-C   17 g at 04/20/16 0819  . potassium chloride SA (K-DUR,KLOR-CON) CR tablet 20 mEq  20 mEq Oral Q8H Dorothy Spark, MD      . pravastatin (PRAVACHOL) tablet 10 mg  10 mg  Oral q1800 Liliane Shi, PA-C   10 mg at 04/19/16 1650  . senna (SENOKOT) tablet 8.6 mg  1 tablet Oral BID PRN Liliane Shi, PA-C      . sodium chloride (OCEAN) 0.65 % nasal spray 1 spray  1 spray Each Nare PRN Rogelia Mire, NP      . sodium chloride flush (NS) 0.9 % injection 10-40 mL  10-40 mL Intracatheter Q12H Dorothy Spark, MD   10 mL at 04/20/16 0807  . sodium chloride flush (NS) 0.9 % injection 10-40 mL  10-40 mL Intracatheter PRN Dorothy Spark, MD      . sodium chloride flush (NS) 0.9 % injection 3 mL  3 mL Intravenous PRN Liliane Shi, PA-C      . spironolactone (ALDACTONE) tablet 50 mg  50 mg Oral Daily Liliane Shi, PA-C   50 mg at 04/20/16 6283  . traMADol (ULTRAM) tablet 50 mg  50 mg Oral Q12H PRN Liliane Shi, PA-C   50 mg at 04/19/16 6629    Musculoskeletal: Strength & Muscle Tone: decreased Gait & Station: unable to stand Patient leans: N/A  Psychiatric Specialty Exam: Physical Exam  ROS.  Blood pressure 106/57, pulse 70, temperature 97.4 F (36.3 C), temperature source Oral, resp. rate 14, height '5\' 5"'$  (1.651 m), weight 111.4 kg (245 lb 9.5 oz), SpO2 96 %.Body mass index is 40.87 kg/(m^2).  General Appearance: Guarded  Eye Contact:  Good  Speech:  Clear and Coherent  Volume:  Decreased  Mood:  Anxious and Depressed  Affect:  Appropriate and Congruent  Thought Process:  Coherent  Orientation:  Full (Time, Place, and Person)  Thought Content:  WDL  Suicidal Thoughts:  No  Homicidal Thoughts:  No  Memory:  Immediate;   Good Recent;   Fair Remote;   Fair  Judgement:  Intact  Insight:  Fair  Psychomotor Activity:  Decreased  Concentration:  Concentration: Fair  Recall:  Smiley Houseman of Knowledge:  Good  Language:  Fair  Akathisia:  Negative  Handed:  Right  AIMS (if indicated):     Assets:  Communication Skills Desire for Improvement Financial Resources/Insurance Housing Intimacy Leisure Time Resilience Social  Support Transportation  ADL's:  Impaired  Cognition:  WNL  Sleep:        Treatment Plan Summary: Patient continue to suffer with severe anxiety and depression which increase her insomnia in addition to her medication conditions. Continue klonopin 0.5 mg PO BID and TID/PRN for anxiety and avoid sedated and SOB  Increase Lexapro 10 mg PO QHS for depression and generalized anxiety Increase Gabapentin 200 mg PO TID for excessive anxiety Will introduce Trazodone 50 mg PO Qhs/PRN for insomnia Appreciate psychiatric consultation and follow up as clinically required Please contact [phone] or [phone] if needs further assistance  Disposition: Patient will be referred to the outpatient medication management and medical history. Patient does not meet criteria for psychiatric inpatient admission. Supportive therapy provided about ongoing stressors.  Ambrose Finland, MD 04/20/2016 2:57 PM

## 2016-04-21 ENCOUNTER — Inpatient Hospital Stay: Payer: Self-pay | Admitting: Internal Medicine

## 2016-04-21 LAB — CREATININE, SERUM
Creatinine, Ser: 0.78 mg/dL (ref 0.44–1.00)
GFR calc Af Amer: >60 mL/min (ref >60)
GFR calc non Af Amer: >60 mL/min (ref >60)

## 2016-04-21 LAB — CARBOXYHEMOGLOBIN
Carboxyhemoglobin: 1.6 % — ABNORMAL HIGH (ref 0.5–1.5)
Methemoglobin: 0.7 % (ref 0.0–1.5)
O2 Saturation: 72.1 %
Total hemoglobin: 11.2 g/dL — ABNORMAL LOW (ref 12.0–16.0)

## 2016-04-21 LAB — GLUCOSE, CAPILLARY
Glucose-Capillary: 140 mg/dL — ABNORMAL HIGH (ref 65–99)
Glucose-Capillary: 125 mg/dL — ABNORMAL HIGH (ref 65–99)
Glucose-Capillary: 145 mg/dL — ABNORMAL HIGH (ref 65–99)
Glucose-Capillary: 130 mg/dL — ABNORMAL HIGH (ref 65–99)
Glucose-Capillary: 131 mg/dL — ABNORMAL HIGH (ref 65–99)

## 2016-04-21 NOTE — Progress Notes (Signed)
TCTS BRIEF PROGRESS NOTE   Mrs Finchum has done very well w/ aggressive medical Rx for CHF.  Await results of f/u CT angio head and diagnostic catheterization.  Input from Psychiatry service noted.  Will continue to follow.  Rexene Alberts, MD 04/21/2016 4:40 PM

## 2016-04-21 NOTE — Progress Notes (Signed)
[facility] for Infectious Disease    Date of Admission:  04/14/2016   Total days of antibiotics 7        Day 7 ceftriaxone           ID: Margaret [name] is a 60 y.o. 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  Medications:  . albuterol  2.5 mg Nebulization QHS  . antiseptic oral rinse  7 mL Mouth Rinse BID  . aspirin  81 mg Oral Daily  . atenolol  25 mg Oral BID  . cholecalciferol  1,000 Units Oral BID  . clonazePAM  0.5 mg Oral BID  . enoxaparin (LOVENOX) injection  40 mg Subcutaneous Q24H  . escitalopram  10 mg Oral QHS  . feeding supplement (ENSURE ENLIVE)  237 mL Oral BID BM  . furosemide  80 mg Intravenous Q8H  . gabapentin  200 mg Oral TID  . insulin aspart  0-20 Units Subcutaneous TID WC  . insulin aspart  0-5 Units Subcutaneous QHS  . insulin aspart protamine- aspart  10 Units Subcutaneous BID WC  . linagliptin  5 mg Oral Daily  . metFORMIN  1,000 mg Oral BID WC  . polyethylene glycol  17 g Oral Daily  . potassium chloride  20 mEq Oral Q8H  . pravastatin  10 mg Oral q1800  . sodium chloride flush  10-40 mL Intracatheter Q12H  . spironolactone  50 mg Oral Daily    Objective: Vital signs in last 24 hours: Temp:  [97.3 F (36.3 C)-98.6 F (37 C)] 98.1 F (36.7 C) (05/30 0757) Pulse Rate:  [66-98] 74 (05/30 0757) Resp:  [14-27] 20 (05/30  0757) BP: (105-126)/(48-76) 108/68 mmHg (05/30 0757) SpO2:  [88 %-100 %] 94 % (05/30 0757) Weight:  [245 lb 9.5 oz (111.4 kg)] 245 lb 9.5 oz (111.4 kg) (05/30 0500)  Out of room  Lab Results  Recent Labs  04/19/16 0335 04/20/16 0420 04/21/16 0438  WBC 14.2* 14.6*  --   HGB 11.3* 11.0*  --   HCT 39.8 38.9  --   NA 135 135  --   K 4.1 4.3  --   CL 87* 89*  --   CO2 38* 36*  --   BUN 20 18  --   CREATININE 0.78 0.79 0.78   Microbiology: 5/24 blood cx NGTD 5/25 urine cx NGTD Studies/Results: No results found.   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 She was empirically started back on ceftriaxone though no positive blood cultures. Recommend to stop IV abtx and observe off of therapy  Decompensated HF = she is down 8L, getting close to her dry weight.   Severe MR = has multiple studies to undergo to evaluate for heart surgery, deemed risky for numerous co-morbidities.patient is hopeful, and   Anxiety =  seen by psychiatry who has titrated medications to help with symptoms. Not unusual to see depression/anxiety worsen in times of significant health issues such as stroke.  Baxter Flattery [facility] for Infectious Diseases Cell: [phone] Pager: [phone]  04/21/2016, 10:24 AM

## 2016-04-21 NOTE — Progress Notes (Signed)
Primary cardiologist: Dr. Ena Dawley  Seen for followup: Diastolic heart failure, SBE with mitral regurgitation  Subjective:    Pt feels better, SOB has improved. Reports good UOP.   Objective:   Temp:  [97.3 F (36.3 C)-98.6 F (37 C)] 98.1 F (36.7 C) (05/30 0757) Pulse Rate:  [66-98] 74 (05/30 0757) Resp:  [14-27] 20 (05/30 0757) BP: (105-126)/(48-76) 108/68 mmHg (05/30 0757) SpO2:  [88 %-100 %] 94 % (05/30 0757) Weight:  [245 lb 9.5 oz (111.4 kg)] 245 lb 9.5 oz (111.4 kg) (05/30 0500) Last BM Date: 04/20/16  Filed Weights   04/19/16 0815 04/20/16 0500 04/21/16 0500  Weight: 243 lb (110.224 kg) 245 lb 9.5 oz (111.4 kg) 245 lb 9.5 oz (111.4 kg)    Intake/Output Summary (Last 24 hours) at 04/21/16 0835 Last data filed at 04/21/16 0500  Gross per 24 hour  Intake    740 ml  Output    400 ml  Net    340 ml    Telemetry: Sinus rhythm  Exam:  General: Obese woman, sitting up in bed eating breakfast, NAD HEENT: Poor dentition, moist mucous membranes, EOMI, no scleral icterus Lungs: CTAB, no wheezing or crackles, distant breath sounds due to body habitus Cardiac: RRR, LUSB systolic murmur, no rubs or gallops Abdomen: soft, non TTP Extremities: chronic venous stasis changes, 2+ pedal edema up to knees b/l Skin: warm and dry   Lab Results:  Basic Metabolic Panel:  Recent Labs Lab 04/15/16 0320  04/17/16 0325 04/19/16 0335 04/20/16 0420 04/21/16 0438  NA 137  < > 136 135 135  --   K 4.0  < > 4.1 4.1 4.3  --   CL 93*  < > 89* 87* 89*  --   CO2 36*  < > 40* 38* 36*  --   GLUCOSE 118*  < > 116* 170* 145*  --   BUN 8  < > 18 20 18   --   CREATININE 0.72  < > 0.81 0.78 0.79 0.78  CALCIUM 10.7*  < > 10.8* 10.7* 10.6*  --   MG 2.2  --   --   --   --   --   < > = values in this interval not displayed.  Liver Function Tests:  Recent Labs Lab 04/14/16 1840  AST 20  ALT 14  ALKPHOS 75  BILITOT 0.3  PROT 7.3  ALBUMIN 3.0*    CBC:  Recent  Labs Lab 04/14/16 1840 04/19/16 0335 04/20/16 0420  WBC 13.4* 14.2* 14.6*  HGB 11.4* 11.3* 11.0*  HCT 39.0 39.8 38.9  MCV 89.4 89.4 88.8  PLT 596* 648* 651*    Coagulation:  Recent Labs Lab 04/14/16 1840  INR 1.12   Echocardiogram 03/31/2016: Study Conclusions  - Left ventricle: The cavity size was normal. There was moderate  concentric hypertrophy. Systolic function was normal. The  estimated ejection fraction was in the range of 60% to 65%. Wall  motion was normal; there were no regional wall motion  abnormalities. Features are consistent with a pseudonormal left  ventricular filling pattern, with concomitant abnormal relaxation  and increased filling pressure (grade 2 diastolic dysfunction).  Doppler parameters are consistent with elevated ventricular  end-diastolic filling pressure. - Aortic valve: Trileaflet; normal thickness leaflets. There was no  regurgitation. - Aortic root: The aortic root was normal in size. - Mitral valve: Moderately thickened, moderately calcified leaflets  . There was severe regurgitation directed centrally and  anteriorly. - Left atrium:  The atrium was severely dilated. - Right ventricle: The cavity size was normal. Wall thickness was  normal. Systolic function was normal. - Right atrium: The atrium was mildly dilated. - Pulmonic valve: There was mild regurgitation. - Pulmonary arteries: Systolic pressure was within the normal  range. - Inferior vena cava: The vessel was normal in size.  Impressions:  - There is a highly mobile echodensity attached to the posterior  mitral valve leaflet measuring 24 mm in its longest diameter.  This is most probably consistent with a vegetation.  When compared to the prior study from 02/11/2016 this might be  slightly increased in size.  Mitral regurgitation is severe.  Normal RVSP.   Medications:   Scheduled Medications: . albuterol  2.5 mg Nebulization QHS  . antiseptic  oral rinse  7 mL Mouth Rinse BID  . aspirin  81 mg Oral Daily  . atenolol  25 mg Oral BID  . cefTRIAXone (ROCEPHIN)  IV  2 g Intravenous Q24H  . cholecalciferol  1,000 Units Oral BID  . clonazePAM  0.5 mg Oral BID  . enoxaparin (LOVENOX) injection  40 mg Subcutaneous Q24H  . escitalopram  10 mg Oral QHS  . feeding supplement (ENSURE ENLIVE)  237 mL Oral BID BM  . furosemide  80 mg Intravenous Q8H  . gabapentin  200 mg Oral TID  . insulin aspart  0-20 Units Subcutaneous TID WC  . insulin aspart  0-5 Units Subcutaneous QHS  . insulin aspart protamine- aspart  10 Units Subcutaneous BID WC  . linagliptin  5 mg Oral Daily  . metFORMIN  1,000 mg Oral BID WC  . polyethylene glycol  17 g Oral Daily  . potassium chloride  20 mEq Oral Q8H  . pravastatin  10 mg Oral q1800  . sodium chloride flush  10-40 mL Intracatheter Q12H  . spironolactone  50 mg Oral Daily    PRN Medications: sodium chloride, acetaminophen, albuterol, clonazePAM, nitroGLYCERIN, ondansetron (ZOFRAN) IV, senna, sodium chloride, sodium chloride flush, sodium chloride flush, traMADol, traZODone   Assessment/Plan:   1. Acute on chronic diastolic heart failure-- pt weighs 245 lbs today (dry weight 240-245lbs) with net negative 8L balance since admission. Plan is to continue lasix IV 80mg  TID for one more day then back to BID dosing.  - continue asa and BB - BP low normal, an ACEinh would be beneficial for patient, can consider decreasing aldactone in favor of adding lisinopril.  - wean O2 as tolerated - transfer to tele bed today.  2. 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 Initially admitted in 01/2016 with splenic infarct, placed on Xarelto. Later came back with embolic CVA, confirmed mitral valve vegetation. - Seen by Dr. Roxy Manns - felt to be a poor candidate for surgery in her current state. Neurology recommend delay surgery until at least Sept (6 months following CVA/SAH). Per Dr.  Roxy Manns, will optimize her volume status, later may consider L&RHC,  PFTs w/ diffusion capacity, ABG on room air, MRI brain, CT angio head and dental consult before high risk surgery.  - pt refusing MRI brain, reluctant to undergo CT angio of brain, will readdress CT angio of head again tomorrow.  - Infectious Disease following, currently on IV rocephin. Blood cultures from 5/24 final result is negative. Per ID can d/c rocephin. They will see pt today.   3. 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 Essential hypertension -- BP low normal ranging from SBP 105-126. Continue atenolol  25mg  BID, aldactone 50mg  daily, and lasix 80mg  IV TID.   5. Hyperlipidemia-- continue pravastatin 10mg   6. DM II-- on metformin, linagliptin, and SSI  7. Anxiety: consulted psychiatry on 5/25 who started her on 0.5mg  BID klonipin with 0.25mg  TID PRN klonipin and lexapro and gabapentin. Also started on trazodone.    Julious Oka, MD 04/21/2016  I have personally seen and examined this patient with Dr. Hulen Luster. I agree with the assessment and plan as outlined above. She has severe MR with evidence of endocarditis and stroke with splenic infarct. Cultures are negative this admission. Cultures from march 2017 with strept viridans. ID following. Will continue to optimize for MVR. Dr. Roxy Manns has seen her. Will continue diuresis today with IV Lasix. She is refusing MRI of her brain. She is not happy about a CTA of her head today. Will transfer to tele. Will discuss CTA head tomorrow. Right and left heart cath later this week. Plan PFTS later this week when resp status is improved.   Lauree Chandler 04/21/2016 10:32 AM

## 2016-04-21 NOTE — Progress Notes (Signed)
Physical Therapy Treatment Patient Details Name: Margaret [name] MRN: [id number] DOB: 10-19-1956 Today's Date: 04/21/2016    History of Present Illness Margaret [name] is a 60 y.o. female admitted with acute  CHF exacerbation.  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    PT Comments    Patient very motivated and tolerates therapy well, however continues to be incontinent of urine which impacts therapeutic activities. (Patient was assisted off very full bedpan on arrival and was still incontinent x 3 during session). Discussed discharge planning and pt feels heart surgery will be done prior to discharge from the hospital.Will continue to follow and update discharge plan as needed.   Follow Up Recommendations  SNF     Equipment Recommendations  None recommended by PT    Recommendations for Other Services       Precautions / Restrictions Precautions Precautions: Fall Precaution Comments: L hemiparesis    Mobility  Bed Mobility Overal bed mobility: Needs Assistance Bed Mobility: Supine to Sit     Supine to sit: Mod assist;HOB elevated     General bed mobility comments: able to move RLE off EOB and assist to move LLE; uses RUE on rail to pivot and come to sit  Transfers Overall transfer level: Needs assistance Equipment used: None Transfers: Sit to/from [employer] Sit to Stand: Mod assist;Min assist;From elevated surface   Squat pivot transfers: Mod assist;From elevated surface     General transfer comment: stood x 4 (EOB and elevated recliner) with one person assist; squat-pivot to rt with pt "shimmying" on Rt foot   Ambulation/Gait             General Gait Details: unable; tried to side step to right with pt "shimmying"  on rt foot   Stairs            Wheelchair Mobility    Modified Rankin (Stroke Patients Only) Modified Rankin (Stroke Patients Only) Pre-Morbid Rankin Score: Severe disability Modified Rankin: Severe disability     Balance   Sitting-balance support: No upper extremity supported;Feet supported Sitting balance-Leahy Scale: Good Sitting balance - Comments: reaching forward and rt outside her BOS   Standing balance support: No upper extremity supported Standing balance-Leahy Scale: Poor Standing balance comment: support to lt knee to prevent buckling                    Cognition Arousal/Alertness: Awake/alert Behavior During Therapy: Anxious Overall Cognitive Status: History of cognitive impairments - at baseline                      Exercises Other Exercises Other Exercises: educated re: importance of supporting LUE when in sitting. Pt endorses Lt shoulder pain and lt hand tightness (+edema)    General Comments General comments (skin integrity, edema, etc.): pt able to self-direct her care and how she prefers to do things; pt incontinent of urine once positioned in recliner; required nursing assist for pericare and switching linens with PT standing pt x 2 minutes      Pertinent Vitals/Pain Pain Assessment: Faces Faces Pain Scale: Hurts little more Pain Location: lt shoulder Pain Descriptors / Indicators: Discomfort Pain Intervention(s): Limited activity within patient's tolerance;Repositioned;Other (comment) (supported LUE in sitting and standing)    Home Living  Prior Function            PT Goals (current goals can now be found in the care plan section) Acute Rehab PT Goals Patient Stated Goal: to regain independence  Time For Goal Achievement: 04/22/16 Progress towards PT goals: Progressing toward goals    Frequency  Min 2X/week    PT Plan Current plan remains appropriate    Co-evaluation              End of Session Equipment Utilized During Treatment: Gait belt Activity Tolerance: Patient tolerated treatment well Patient left: in bed;with call bell/phone within reach;with nursing/sitter in room;with family/visitor present (returned to supine for RN testing )     Time: 4098-1191 PT Time Calculation (min) (ACUTE ONLY): 47 min  Charges:  $Therapeutic Activity: 38-52 mins                    G Codes:      Margaret [name] 29-Apr-2016, 4:40 PM Pager [phone]

## 2016-04-21 NOTE — Progress Notes (Signed)
PT Cancellation Note  Patient Details Name: NEAL ROMANCHUK MRN: [id number] DOB: Jun 09, 1956   Cancelled Treatment:    Reason Eval/Treat Not Completed: Fatigue/lethargy limiting ability to participate. Patient sleeping soundly--husband nor friend able to arouse. Noted she had not slept well for many nights and had klonopin.   Noted ?plans for discharge. Spouse present and he does not want pt to return to SNF, however acknowledges he can not currently care for her at home. He thinks that she may be preparing for heart surgery soon.   Will continue to follow and assess needs.   Zimri Brennen 04/21/2016, 11:30 AM  Pager [phone]

## 2016-04-22 ENCOUNTER — Inpatient Hospital Stay (HOSPITAL_COMMUNITY): Payer: Medicaid Other

## 2016-04-22 LAB — GLUCOSE, CAPILLARY
Glucose-Capillary: 159 mg/dL — ABNORMAL HIGH (ref 65–99)
Glucose-Capillary: 127 mg/dL — ABNORMAL HIGH (ref 65–99)
Glucose-Capillary: 102 mg/dL — ABNORMAL HIGH (ref 65–99)
Glucose-Capillary: 142 mg/dL — ABNORMAL HIGH (ref 65–99)

## 2016-04-22 MED ORDER — FUROSEMIDE 10 MG/ML IJ SOLN
80.0000 mg | Freq: Two times a day (BID) | INTRAMUSCULAR | Status: DC
  Administered 2016-04-22 – 2016-04-24 (×3): 80 mg via INTRAVENOUS
  Filled 2016-04-22 (×3): qty 8

## 2016-04-22 MED ORDER — IOPAMIDOL (ISOVUE-370) INJECTION 76%
INTRAVENOUS | Status: AC
  Administered 2016-04-22: 50 mL via INTRAVENOUS
  Filled 2016-04-22: qty 50

## 2016-04-22 NOTE — Progress Notes (Signed)
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 Meda Coffee  60 yo female w/PMH of COPD, HFpEF, OSA, HTN, HLD, Type 2 DM, and tobacco abuse who presented to [facility] on 04/14/2016 as a direct admit from the office for acute on chronic diastolic CHF.   Subjective   Reports her respiratory status and lower extremity edema has improved. She refuses to have an MRI. Is agreeable to a CT Angio Head.  Inpatient Medications    . albuterol  2.5 mg Nebulization QHS  . antiseptic oral rinse  7 mL Mouth Rinse BID  . aspirin  81 mg Oral Daily  . atenolol  25 mg Oral BID  . cholecalciferol  1,000 Units Oral BID  . clonazePAM  0.5 mg Oral BID  . enoxaparin (LOVENOX) injection  40 mg Subcutaneous Q24H  . escitalopram  10 mg Oral QHS  . feeding supplement (ENSURE ENLIVE)  237 mL Oral BID BM  . furosemide  80 mg Intravenous Q8H  . gabapentin  200 mg Oral TID  . insulin aspart  0-20 Units Subcutaneous TID WC  . insulin aspart  0-5 Units Subcutaneous QHS  . insulin aspart protamine- aspart  10 Units Subcutaneous BID WC  . linagliptin  5 mg Oral Daily  . metFORMIN  1,000 mg Oral BID WC  . polyethylene glycol  17 g Oral Daily  . potassium chloride  20 mEq Oral Q8H  . pravastatin  10 mg Oral q1800  . sodium chloride flush  10-40 mL Intracatheter Q12H  . spironolactone  50 mg Oral Daily    Vital Signs    Filed Vitals:   04/21/16 2043 04/22/16 0013 04/22/16 0413 04/22/16 0958  BP:  116/52  93/61 98/47  Pulse:  68 67 65  Temp:  98.5 F (36.9 C) 97.8 F (36.6 C)   TempSrc:  Oral Oral   Resp:  20 20   Height:      Weight:   240 lb (108.863 kg)   SpO2: 99% 99% 100% 98%    Intake/Output Summary (Last 24 hours) at 04/22/16 1044 Last data filed at 04/22/16 0814  Gross per 24 hour  Intake    860 ml  Output   1350 ml  Net   -490 ml   Filed Weights   04/21/16 0500 04/21/16 1307 04/22/16 0413  Weight: 245 lb 9.5 oz (111.4 kg) 242 lb 14.4 oz (110.179 kg) 240 lb (108.863 kg)    Physical Exam    General: Obese Caucasian female appearing in no acute distress. Head: Normocephalic, atraumatic.  Neck: Supple without bruits, JVD not elevated. Lungs:  Resp regular and unlabored, CTA without wheezing or rales. Heart: RRR, S1, S2, no S3, S4, or murmur; no rub. Abdomen: Soft, non-tender, non-distended with normoactive bowel sounds. No hepatomegaly. No rebound/guarding. No obvious abdominal masses. Extremities: No clubbing or cyanosis, 1+ edema bilaterally. Distal pedal pulses are 2+ bilaterally. Neuro: Alert and oriented X 3. Moves all extremities spontaneously. Psych:  Normal affect.  Labs    CBC  Recent Labs  04/20/16 0420  WBC 14.6*  HGB 11.0*  HCT 38.9  MCV 88.8  PLT [id number]*   Basic Metabolic Panel  Recent Labs  04/20/16 0420 04/21/16 0438  NA 135  --   K 4.3  --   CL 89*  --   CO2 36*  --   GLUCOSE 145*  --   BUN 18  --   CREATININE 0.79 0.78  CALCIUM 10.6*  --     Telemetry    NSR, HR in 60's - 70's.   ECG    No new tracings.   Cardiac Studies and Radiology    Dg Orthopantogram: 04/15/2016  CLINICAL DATA:  60 year old female with cardiac valvular disease and CHF. Poor dentition. Initial encounter. EXAM: ORTHOPANTOGRAM/PANORAMIC COMPARISON:  CTA neck 02/01/2016 FINDINGS: Due to motion artifact on this exam despite repeated attempts detail of the central mandibular dentition is inferior to that on 02/01/2016. Maxillary dentition is absent. No definite  dental caries or periapical dental lucency is identified. The bilateral mandibular molars are absent. IMPRESSION: Due to motion and difficulty with this exam, dental detail is better on the 02/01/2016 Neck CTA. Maxillary dentition is absent. No definite acute mandible dental inflammation. Electronically Signed   By: Genevie Ann M.D.   On: 04/15/2016 07:41   Dg Chest 2 View: 04/15/2016  CLINICAL DATA:  CHF due to valvular disease. EXAM: CHEST  2 VIEW COMPARISON:  04/14/2016 FINDINGS: Mild progression of diffuse bilateral airspace disease consistent with edema. Right lower lobe volume loss and right effusion unchanged. No significant effusion on the left. Right jugular central venous catheter tip in the SVC unchanged. IMPRESSION: Diffuse bilateral pulmonary edema with mild progression Right lower lobe volume loss and small right effusion unchanged. Electronically Signed   By: Franchot Gallo M.D.   On: 04/15/2016 07:38   Dg Chest Port 1 View: 04/14/2016  CLINICAL DATA:  Post PICC line placement. EXAM: PORTABLE CHEST 1 VIEW COMPARISON:  02/12/2016 FINDINGS: Examination demonstrates interval placement of right central venous catheter with tip over the SVC. No evidence of pneumothorax. The Lungs are hypoinflated with moderate perihilar bibasilar opacification likely interstitial edema, although cannot exclude infection. Likely small right pleural effusion. Stable cardiomegaly. There is calcified plaque over the aortic arch. Remainder of the exam is unchanged. IMPRESSION: Moderate perihilar and bibasilar opacification which may be due to interstitial edema versus infection. Small right pleural effusion. Stable cardiomegaly. Right IJ central venous catheter with tip over the SVC. No pneumothorax. Electronically Signed   By: Marin Olp M.D.   On: 04/14/2016 22:02    Assessment & Plan    1. Acute on chronic diastolic heart failure -  Pt's weight at 240 lbs today (dry weight 240-245lbs) with net negative -9.0L since  admission.  - Patient has been mildly hypotensive and is approaching her dry weight. Will change Lasix dosing from 80mg  TID to 80mg  BID.  - wean O2 as tolerated. - continue BB and Aldactone. Consider addition of ACE-I prior to discharge if BP will allow.  2. 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 Later came back with embolic CVA, confirmed mitral valve vegetation.Seen by Dr. Roxy Manns and felt to be a poor surgical candidate in her current state. Neurology recommended delay of surgery until at least Sept (6 months following CVA/SAH). Per Dr. Roxy Manns, will optimize her volume status, later may  consider L&RHC, PFTs w/ diffusion capacity, ABG on room air, MRI brain, CT angio head and dental consult before high risk surgery.  - pt refusing MRI brain. Is agreeable to have CT Angio Head today (will need PRN Klonopin 20 minutes prior to imaging study). Will plan for PFT's today or tomorrow and Right/Left heart cath tomorrow. - Infectious Disease following. Blood cultures from 5/24 final result is negative. IV Abx discontinued.  3. 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  4. Essential hypertension  - BP has been 93/47 - 116/63 in the past 24 hours. IV Lasix dosing decreased. - Continue Atenolol 25mg  BID and Aldactone 50mg  daily. May require further reduction if hypotension persists.  5. Hyperlipidemia - continue Pravastatin 10mg   6. Type 2 DM - on metformin, linagliptin, and SSI  7. Anxiety - consulted psychiatry on 5/25 who started her on 0.5mg  BID klonipin with 0.25mg  TID PRN klonipin, lexapro, and gabapentin. - appreciate Psychiatry recommendations.  Arna Medici , PA-C 10:44 AM 04/22/2016 Pager: [phone]    I have personally seen and examined this patient with Bernerd Pho, PA-C I agree with the assessment and plan  as outlined above. She has severe MR with evidence of endocarditis and stroke with splenic infarct. Cultures are negative this admission. Cultures from march 2017 with strept viridans. ID following and antibiotics have been stopped. Will continue to optimize for MVR. Dr. Roxy Manns has seen her.  She has diuresed well with IV Lasix but still with mild volume overload. Will lower Lasix to BID today. She is refusing MRI of her brain. She is willing to schedule CTA head today per recs of Neuro/CT surgery. Will plan PFTS over next 48 hours. Right and left heart cath tomorrow.    Lauree Chandler 04/22/2016 11:25 AM

## 2016-04-22 NOTE — Clinical Social Work Note (Addendum)
Patient does not want any other SNF than [facility] in [place]. If she cannot go there, she wants to go back home. RNCM notified. Medicaid still pending. She went to [facility] at last admission on a 30-day LOG. Surveyor, quantity of social work notified.  Dayton Scrape, CSW [phone]  CSW spoke with patient's husband regarding concerns about discharge planning. He confirmed plans for [facility] or home. Patient's husband is POA.  Dayton Scrape, [place]

## 2016-04-22 NOTE — NC FL2 (Signed)
[place] LEVEL OF CARE SCREENING TOOL     IDENTIFICATION  Patient Name: Margaret [name] Birthdate: Dec 14, 1955 Sex: female Admission Date (Current Location): 04/14/2016  [facility] [place] Number:  Herbalist and Address:  [facility], [place] [address] [place], [place] [address]      Provider Number: [id number]  Attending Physician Name and Address:  Dorothy Spark, MD  Relative Name and Phone Number:       Current Level of Care: Hospital Recommended Level of Care: [place] Prior Approval Number:    Date Approved/Denied:   PASRR Number: [id number]  Discharge Plan: SNF    Current Diagnoses: Patient Active Problem List   Diagnosis Date Noted  . 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 Acute on chronic diastolic congestive heart failure due to valvular disease ([place]) 04/14/2016  . CHF due to valvular disease, acute on chronic, diastolic [id number]  . Chronic diastolic congestive heart failure due to valvular disease ([place]) 04/13/2016  . History of stroke 04/13/2016  . Cerebrovascular accident (CVA) due to occlusion of right carotid artery ([place]) 03/25/2016  . Splenic infarct 03/25/2016  . Type 2 diabetes mellitus with circulatory disorder ([place]) 03/25/2016  . Mitral regurgitation 03/06/2016  . HLD (hyperlipidemia) 03/06/2016  . Right carotid artery occlusion   . Epigastric abdominal tenderness on direct palpation   . Endocarditis of mitral valve   . Positive ANA (antinuclear antibody)   . Streptococcus viridans infection 02/04/2016  . Cerebrovascular accident (CVA) due to embolism of cerebral artery ([place])   . Subarachnoid hemorrhage ([place]) 02/01/2016  . Stroke ([place])   . Visual changes   . Pulmonary infiltrates 01/23/2016  . Pulmonary hypertension assoc with unclear multi-factorial mechanisms ([place]) 01/23/2016  .  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 Insulin dependent diabetes mellitus ([place]) 01/14/2016  . Essential hypertension 01/14/2016  . Hepatic cirrhosis ([place]) 01/14/2016  . COPD (chronic obstructive pulmonary disease) ([place]) 04/27/2012  . Chronic diastolic CHF (congestive heart failure) ([place]) 04/27/2012  . Morbid obesity ([place]) 04/27/2012  . Hypoxia 04/27/2012  . Dyspnea on exertion 04/27/2012  . Smoker 04/27/2012    Orientation RESPIRATION BLADDER Height & Weight     Self, Time, Situation, Place  O2 (Nasal Canula 3 L) Incontinent Weight: 240 lb (108.863 kg) (bedscale ) Height:  5\' 5"  (165.1 cm)  BEHAVIORAL SYMPTOMS/MOOD NEUROLOGICAL BOWEL NUTRITION STATUS   (None)  (History of stroke) Continent Diet (Heart healthy/carb-modified)  AMBULATORY STATUS COMMUNICATION OF NEEDS Skin   Limited Assist Verbally                         Personal Care Assistance Level of Assistance  Bathing, Feeding, Dressing Bathing Assistance: Limited assistance Feeding assistance: Independent Dressing Assistance: Limited assistance     Functional Limitations Info  Sight, Hearing, Speech Sight Info: Adequate Hearing Info: Adequate Speech Info: Impaired    SPECIAL CARE FACTORS FREQUENCY  PT (By licensed PT), OT (By licensed OT), Blood pressure, Diabetic urine testing     PT Frequency: 5 x week OT Frequency: 5 x week            Contractures Contractures Info: Not present    Additional Factors Info  Code Status, Allergies, Psychotropic Code Status Info: Full Allergies Info: Codiene, Latex Psychotropic Info: Panic Disorder with agoraphobia, Severe panic attacks, Other depression due to medical condition,  Current Medications (04/22/2016):  This is the current hospital active medication list Current Facility-Administered Medications  Medication Dose Route Frequency Provider Last Rate Last Dose  . 0.9 %  sodium chloride infusion  250 mL Intravenous PRN Liliane Shi, PA-C 10 mL/hr at  04/20/16 1700 250 mL at 04/20/16 1700  . acetaminophen (TYLENOL) tablet 650 mg  650 mg Oral Q4H PRN Liliane Shi, PA-C   650 mg at 04/22/16 0818  . albuterol (PROVENTIL) (2.5 MG/3ML) 0.083% nebulizer solution 2.5 mg  2.5 mg Inhalation Q4H PRN Liliane Shi, PA-C      . albuterol (PROVENTIL) (2.5 MG/3ML) 0.083% nebulizer solution 2.5 mg  2.5 mg Nebulization QHS Liliane Shi, PA-C   2.5 mg at 04/21/16 2042  . antiseptic oral rinse (CPC / CETYLPYRIDINIUM CHLORIDE 0.05%) solution 7 mL  7 mL Mouth Rinse BID Dorothy Spark, MD   7 mL at 04/22/16 1214  . aspirin chewable tablet 81 mg  81 mg Oral Daily Liliane Shi, PA-C   81 mg at 04/22/16 [id number]  . atenolol (TENORMIN) tablet 25 mg  25 mg Oral BID Liliane Shi, PA-C   25 mg at 04/22/16 1000  . cholecalciferol (VITAMIN D) tablet 1,000 Units  1,000 Units Oral BID Liliane Shi, PA-C   1,000 Units at 04/22/16 [id number]  . clonazePAM (KLONOPIN) tablet 0.5 mg  0.5 mg Oral BID Ambrose Finland, MD   0.5 mg at 04/22/16 0753  . clonazePAM (KLONOPIN) tablet 0.5 mg  0.5 mg Oral TID PRN Lolita Cram Means, MD   0.5 mg at 04/22/16 1346  . enoxaparin (LOVENOX) injection 40 mg  40 mg Subcutaneous Q24H Liliane Shi, PA-C   40 mg at 04/21/16 2216  . escitalopram (LEXAPRO) tablet 10 mg  10 mg Oral QHS Ambrose Finland, MD   10 mg at 04/21/16 2213  . feeding supplement (ENSURE ENLIVE) (ENSURE ENLIVE) liquid 237 mL  237 mL Oral BID BM Dorothy Spark, MD   237 mL at 04/22/16 0954  . furosemide (LASIX) injection 80 mg  80 mg Intravenous BID [place], [place]      . gabapentin (NEURONTIN) capsule 200 mg  200 mg Oral TID Ambrose Finland, MD   200 mg at 04/22/16 0954  . insulin aspart (novoLOG) injection 0-20 Units  0-20 Units Subcutaneous TID WC Liliane Shi, PA-C   3 Units at 04/22/16 1213  . insulin aspart (novoLOG) injection 0-5 Units  0-5 Units Subcutaneous QHS Liliane Shi, PA-C   0 Units at 04/14/16 2200  . insulin aspart  protamine- aspart (NOVOLOG MIX 70/30) injection 10 Units  10 Units Subcutaneous BID WC Liliane Shi, PA-C   10 Units at 04/22/16 0754  . linagliptin (TRADJENTA) tablet 5 mg  5 mg Oral Daily Liliane Shi, PA-C   5 mg at 04/22/16 [id number]  . metFORMIN (GLUCOPHAGE) tablet 1,000 mg  1,000 mg Oral BID WC Liliane Shi, PA-C   1,000 mg at 04/22/16 0753  . nitroGLYCERIN (NITROSTAT) SL tablet 0.4 mg  0.4 mg Sublingual Q5 Min x 3 PRN Liliane Shi, PA-C      . ondansetron (ZOFRAN) injection 4 mg  4 mg Intravenous Q6H PRN Liliane Shi, PA-C      . polyethylene glycol (MIRALAX / GLYCOLAX) packet 17 g  17 g Oral Daily Liliane Shi, PA-C   17 g at 04/21/16 0910  . potassium chloride SA (K-DUR,KLOR-CON) CR tablet 20 mEq  20  mEq Oral Q8H Dorothy Spark, MD   20 mEq at 04/22/16 1346  . pravastatin (PRAVACHOL) tablet 10 mg  10 mg Oral q1800 Liliane Shi, PA-C   10 mg at 04/21/16 1816  . senna (SENOKOT) tablet 8.6 mg  1 tablet Oral BID PRN Liliane Shi, PA-C      . sodium chloride (OCEAN) 0.65 % nasal spray 1 spray  1 spray Each Nare PRN Rogelia Mire, NP      . sodium chloride flush (NS) 0.9 % injection 10-40 mL  10-40 mL Intracatheter Q12H Dorothy Spark, MD   10 mL at 04/21/16 0912  . sodium chloride flush (NS) 0.9 % injection 10-40 mL  10-40 mL Intracatheter PRN Dorothy Spark, MD   10 mL at 04/21/16 1720  . sodium chloride flush (NS) 0.9 % injection 3 mL  3 mL Intravenous PRN Liliane Shi, PA-C      . spironolactone (ALDACTONE) tablet 50 mg  50 mg Oral Daily Liliane Shi, PA-C   50 mg at 04/22/16 [id number]  . traMADol (ULTRAM) tablet 50 mg  50 mg Oral Q12H PRN Liliane Shi, PA-C   50 mg at 04/21/16 2354  . traZODone (DESYREL) tablet 50 mg  50 mg Oral QHS PRN Ambrose Finland, MD   50 mg at 04/21/16 2354     Discharge Medications: Please see discharge summary for a list of discharge medications.  Relevant Imaging Results:  Relevant Lab Results:   Additional  Information SS#: [id number]  Candie Chroman, LCSW

## 2016-04-22 NOTE — Progress Notes (Signed)
Occupational Therapy Treatment Patient Details Name: Margaret [name] MRN: [id number] DOB: 10-Jan-1956 Today's Date: 04/22/2016    History of present illness Margaret [name] is a 60 y.o. female admitted with acute  CHF exacerbation.  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   OT comments  Pt is progressing with ability to perform ADLs - is now mod A for LB ADLs, and demonsrates minimal movement Lt UE - movement dominated by synergy.   She is very motivated.  Depending on surgical plan, and progress, pt may benefit from CIR if she continues to progress well and demonstrates improved activity tolerance.  She was not getting much therapy at SNF PTA, per her report, and per SW note, pt will not receive PT or OT at SNF.   Follow Up Recommendations  SNF    Equipment Recommendations  None recommended by OT    Recommendations for Other Services      Precautions / Restrictions Precautions Precautions: Fall Precaution Comments: L hemiparesis       Mobility Bed Mobility                  Transfers Overall transfer level: Needs assistance Equipment used: None Transfers: Sit to/from Stand;Stand Pivot Transfers Sit to Stand: Mod assist Stand pivot transfers: Mod assist       General transfer comment: Pt stood with min A to move into standing and mod A to maintain standing once standing achieved- heavy lean to the left.  Assist to pivot hips and Lt foot     Balance Overall balance assessment: Needs assistance Sitting-balance support: Feet supported Sitting balance-Leahy Scale: Good     Standing balance support: Single extremity supported Standing balance-Leahy Scale: Poor Standing balance comment: heavy lean to Lt requiring mod faciliation to correct                     ADL Overall ADL's : Needs assistance/impaired                     Lower Body Dressing: Moderate assistance;Sit to/from stand Lower Body Dressing Details (indicate cue type and reason): Pt is able to don socks with supervision EOB.   Mod A to move sit to stand  Toilet Transfer: Moderate assistance;Stand-pivot;Squat-pivot;BSC Toilet Transfer Details (indicate cue type and reason): Pt requires cues for technique and to pivot Lt foot  Toileting- Clothing Manipulation and Hygiene: Maximal assistance;Sit to/from stand       Functional mobility during ADLs: Moderate assistance        Vision                     Perception     Praxis      Cognition   Behavior During Therapy: Anxious Overall Cognitive Status: History of cognitive impairments - at baseline                       Extremity/Trunk Assessment               Exercises Other Exercises Other Exercises: Facilitation of shoulder flex/ext and elbow flex/ext as well as thumb ext during closed chain activities    Shoulder Instructions       General Comments      Pertinent  Vitals/ Pain       Pain Assessment: Faces Faces Pain Scale: Hurts little more Pain Location: Lt shoulder, rt rib cage  Pain Descriptors / Indicators: Discomfort;Aching Pain Intervention(s): Limited activity within patient's tolerance;Monitored during session;Repositioned  Home Living                                          Prior Functioning/Environment              Frequency Min 2X/week     Progress Toward Goals  OT Goals(current goals can now be found in the care plan section)  Progress towards OT goals: Progressing toward goals  Acute Rehab OT Goals Patient Stated Goal: to regain independence  OT Goal Formulation: With patient Time For Goal Achievement: 04/30/16 Potential to Achieve Goals: Good ADL Goals Pt Will Perform Upper Body Bathing: with min  assist;sitting Pt Will Transfer to Toilet: with mod assist;squat pivot transfer;bedside commode Additional ADL Goal #1: Pt will independently perform self ROM   Plan Discharge plan remains appropriate    Co-evaluation                 End of Session Equipment Utilized During Treatment: Oxygen   Activity Tolerance Patient tolerated treatment well   Patient Left in chair;with call bell/phone within reach;with chair alarm set;with nursing/sitter in room;with family/visitor present   Nurse Communication Mobility status        Time: 1126-1207 OT Time Calculation (min): 41 min  Charges: OT General Charges $OT Visit: 1 Procedure OT Treatments $Neuromuscular Re-education: 38-52 mins  Lashawne Dura M 04/22/2016, 12:26 PM

## 2016-04-23 ENCOUNTER — Encounter (HOSPITAL_COMMUNITY): Payer: Self-pay | Admitting: Cardiovascular Disease

## 2016-04-23 ENCOUNTER — Other Ambulatory Visit: Payer: Self-pay | Admitting: *Deleted

## 2016-04-23 ENCOUNTER — Encounter (HOSPITAL_COMMUNITY): Payer: Self-pay

## 2016-04-23 ENCOUNTER — Encounter (HOSPITAL_COMMUNITY)
Admission: AD | Disposition: A | Discharge status: Rehabilitation Facility | Payer: Self-pay | Source: Ambulatory Visit | Attending: Cardiology

## 2016-04-23 DIAGNOSIS — I340 Nonrheumatic mitral (valve) insufficiency: Secondary | ICD-10-CM

## 2016-04-23 DIAGNOSIS — F419 Anxiety disorder, unspecified: Secondary | ICD-10-CM

## 2016-04-23 HISTORY — PX: CARDIAC CATHETERIZATION: SHX172

## 2016-04-23 LAB — GLUCOSE, CAPILLARY
Glucose-Capillary: 137 mg/dL — ABNORMAL HIGH (ref 65–99)
Glucose-Capillary: 116 mg/dL — ABNORMAL HIGH (ref 65–99)
Glucose-Capillary: 115 mg/dL — ABNORMAL HIGH (ref 65–99)
Glucose-Capillary: 133 mg/dL — ABNORMAL HIGH (ref 65–99)
Glucose-Capillary: 149 mg/dL — ABNORMAL HIGH (ref 65–99)

## 2016-04-23 LAB — POCT I-STAT 3, ART BLOOD GAS (G3+)
Acid-Base Excess: 12 mmol/L — ABNORMAL HIGH (ref 0.0–2.0)
Bicarbonate: 40.7 mEq/L — ABNORMAL HIGH (ref 20.0–24.0)
O2 Saturation: 89 %
TCO2: 43 mmol/L (ref 0–100)
pCO2 arterial: 76.1 mmHg (ref 35.0–45.0)
pH, Arterial: 7.336 — ABNORMAL LOW (ref 7.350–7.450)
pO2, Arterial: 63 mmHg — ABNORMAL LOW (ref 80.0–100.0)

## 2016-04-23 LAB — CBC WITH DIFFERENTIAL/PLATELET
BASOS ABS: 0.1 10*3/uL (ref 0.0–0.1)
Basophils Relative: 1 %
EOS ABS: 1.2 10*3/uL — AB (ref 0.0–0.7)
EOS PCT: 8 %
HCT: 40.1 % (ref 36.0–46.0)
HEMOGLOBIN: 11.3 g/dL — AB (ref 12.0–15.0)
LYMPHS ABS: 2.4 10*3/uL (ref 0.7–4.0)
LYMPHS PCT: 16 %
MCH: 25.2 pg — AB (ref 26.0–34.0)
MCHC: 28.2 g/dL — ABNORMAL LOW (ref 30.0–36.0)
MCV: 89.5 fL (ref 78.0–100.0)
Monocytes Absolute: 1 10*3/uL (ref 0.1–1.0)
Monocytes Relative: 7 %
NEUTROS PCT: 68 %
Neutro Abs: 9.9 10*3/uL — ABNORMAL HIGH (ref 1.7–7.7)
PLATELETS: 676 10*3/uL — AB (ref 150–400)
RBC: 4.48 MIL/uL (ref 3.87–5.11)
RDW: 16.2 % — ABNORMAL HIGH (ref 11.5–15.5)
WBC: 14.4 10*3/uL — AB (ref 4.0–10.5)

## 2016-04-23 LAB — BASIC METABOLIC PANEL
Anion gap: 9 (ref 5–15)
Anion gap: 10 (ref 5–15)
BUN: 25 mg/dL — ABNORMAL HIGH (ref 6–20)
BUN: 23 mg/dL — ABNORMAL HIGH (ref 6–20)
CHLORIDE: 86 mmol/L — AB (ref 101–111)
CHLORIDE: 88 mmol/L — AB (ref 101–111)
CO2: 40 mmol/L — ABNORMAL HIGH (ref 22–32)
CO2: 36 mmol/L — ABNORMAL HIGH (ref 22–32)
Calcium: 11.6 mg/dL — ABNORMAL HIGH (ref 8.9–10.3)
Calcium: 11.3 mg/dL — ABNORMAL HIGH (ref 8.9–10.3)
Creatinine, Ser: 0.89 mg/dL (ref 0.44–1.00)
Creatinine, Ser: 0.91 mg/dL (ref 0.44–1.00)
GFR calc non Af Amer: >60 mL/min (ref >60)
Glucose, Bld: 115 mg/dL — ABNORMAL HIGH (ref 65–99)
Glucose, Bld: 178 mg/dL — ABNORMAL HIGH (ref 65–99)
POTASSIUM: 5.2 mmol/L — AB (ref 3.5–5.1)
POTASSIUM: 4.3 mmol/L (ref 3.5–5.1)
SODIUM: 135 mmol/L (ref 135–145)
SODIUM: 134 mmol/L — AB (ref 135–145)

## 2016-04-23 LAB — POCT I-STAT 3, VENOUS BLOOD GAS (G3P V)
Acid-Base Excess: 12 mmol/L — ABNORMAL HIGH (ref 0.0–2.0)
Bicarbonate: 42.2 mEq/L — ABNORMAL HIGH (ref 20.0–24.0)
O2 Saturation: 55 %
TCO2: 45 mmol/L (ref 0–100)
pCO2, Ven: 81.3 mmHg (ref 45.0–50.0)
pH, Ven: 7.323 — ABNORMAL HIGH (ref 7.250–7.300)
pO2, Ven: 33 mmHg (ref 31.0–45.0)

## 2016-04-23 LAB — PROTIME-INR
INR: 1.1 (ref 0.00–1.49)
Prothrombin Time: 14.4 seconds (ref 11.6–15.2)

## 2016-04-23 LAB — CARBOXYHEMOGLOBIN
Carboxyhemoglobin: 1.6 % — ABNORMAL HIGH (ref 0.5–1.5)
Methemoglobin: 0.7 % (ref 0.0–1.5)
O2 Saturation: 69.5 %
Total hemoglobin: 12 g/dL (ref 12.0–16.0)

## 2016-04-23 LAB — MAGNESIUM: MAGNESIUM: 2 mg/dL (ref 1.7–2.4)

## 2016-04-23 SURGERY — RIGHT/LEFT HEART CATH AND CORONARY ANGIOGRAPHY

## 2016-04-23 MED ORDER — IOPAMIDOL (ISOVUE-370) INJECTION 76%
INTRAVENOUS | Status: AC
  Filled 2016-04-23: qty 100

## 2016-04-23 MED ORDER — MIDAZOLAM HCL 2 MG/2ML IJ SOLN
INTRAMUSCULAR | Status: AC
  Filled 2016-04-23: qty 2

## 2016-04-23 MED ORDER — IOPAMIDOL (ISOVUE-370) INJECTION 76%
INTRAVENOUS | Status: DC | PRN
  Administered 2016-04-23: 55 mL via INTRA_ARTERIAL

## 2016-04-23 MED ORDER — SODIUM CHLORIDE 0.9% FLUSH: 3.0000 mL | INTRAVENOUS | Status: DC | PRN

## 2016-04-23 MED ORDER — SODIUM CHLORIDE 0.9 % IV SOLN: 250.0000 mL | INTRAVENOUS | Status: DC | PRN

## 2016-04-23 MED ORDER — SODIUM CHLORIDE 0.9 % IV SOLN
INTRAVENOUS | Status: DC
  Administered 2016-04-23: 04:00:00 via INTRAVENOUS

## 2016-04-23 MED ORDER — SODIUM CHLORIDE 0.9% FLUSH
3.0000 mL | Freq: Two times a day (BID) | INTRAVENOUS | Status: DC
  Administered 2016-04-24 – 2016-04-29 (×5): 3 mL via INTRAVENOUS

## 2016-04-23 MED ORDER — SODIUM CHLORIDE 0.9% FLUSH: 3.0000 mL | Freq: Two times a day (BID) | INTRAVENOUS | Status: DC

## 2016-04-23 MED ORDER — FENTANYL CITRATE (PF) 100 MCG/2ML IJ SOLN
INTRAMUSCULAR | Status: DC | PRN
  Administered 2016-04-23 (×2): 25 ug via INTRAVENOUS

## 2016-04-23 MED ORDER — ASPIRIN 81 MG PO CHEW
81.0000 mg | CHEWABLE_TABLET | ORAL | Status: AC
  Administered 2016-04-23: 81 mg via ORAL
  Filled 2016-04-23: qty 1

## 2016-04-23 MED ORDER — FENTANYL CITRATE (PF) 100 MCG/2ML IJ SOLN
INTRAMUSCULAR | Status: AC
  Filled 2016-04-23: qty 2

## 2016-04-23 MED ORDER — HEPARIN (PORCINE) IN NACL 2-0.9 UNIT/ML-% IJ SOLN
INTRAMUSCULAR | Status: AC
  Filled 2016-04-23: qty 1000

## 2016-04-23 MED ORDER — HEPARIN (PORCINE) IN NACL 2-0.9 UNIT/ML-% IJ SOLN
INTRAMUSCULAR | Status: DC | PRN
  Administered 2016-04-23: 1000 mL

## 2016-04-23 MED ORDER — LIDOCAINE HCL (PF) 1 % IJ SOLN
INTRAMUSCULAR | Status: DC | PRN
  Administered 2016-04-23: 20 mL via INTRADERMAL

## 2016-04-23 MED ORDER — LIDOCAINE HCL (PF) 1 % IJ SOLN
INTRAMUSCULAR | Status: AC
  Filled 2016-04-23: qty 30

## 2016-04-23 MED ORDER — MIDAZOLAM HCL 2 MG/2ML IJ SOLN
INTRAMUSCULAR | Status: DC | PRN
  Administered 2016-04-23 (×2): 1 mg via INTRAVENOUS

## 2016-04-23 MED ORDER — SODIUM CHLORIDE 0.9% FLUSH
3.0000 mL | Freq: Two times a day (BID) | INTRAVENOUS | Status: DC
Start: 2016-04-23 — End: 2016-04-30
  Administered 2016-04-24 – 2016-04-28 (×5): 3 mL via INTRAVENOUS

## 2016-04-23 SURGICAL SUPPLY — 9 items
CATH INFINITI 5FR MULTPACK ANG (CATHETERS) ×2 IMPLANT
CATH SWAN GANZ 7F STRAIGHT (CATHETERS) ×1 IMPLANT
KIT HEART LEFT (KITS) ×2 IMPLANT
PACK CARDIAC CATHETERIZATION (CUSTOM PROCEDURE TRAY) ×2 IMPLANT
SHEATH PINNACLE 5F 10CM (SHEATH) ×1 IMPLANT
SHEATH PINNACLE 7F 10CM (SHEATH) ×2 IMPLANT
SYR MEDRAD MARK V 150ML (SYRINGE) ×1 IMPLANT
TRANSDUCER W/STOPCOCK (MISCELLANEOUS) ×3 IMPLANT
WIRE EMERALD 3MM-J .035X150CM (WIRE) ×1 IMPLANT

## 2016-04-23 NOTE — H&P (View-Only) (Signed)
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 Meda Coffee  60 yo female w/PMH of COPD, HFpEF, OSA, HTN, HLD, Type 2 DM, and tobacco abuse who presented to [facility] on 04/14/2016 as a direct admit from the office for acute on chronic diastolic CHF.   Subjective   Reports her respiratory status and lower extremity edema has improved. She refuses to have an MRI. Is agreeable to a CT Angio Head.  Inpatient Medications    . albuterol  2.5 mg Nebulization QHS  . antiseptic oral rinse  7 mL Mouth Rinse BID  . aspirin  81 mg Oral Daily  . atenolol  25 mg Oral BID  . cholecalciferol  1,000 Units Oral BID  . clonazePAM  0.5 mg Oral BID  . enoxaparin (LOVENOX) injection  40 mg Subcutaneous Q24H  . escitalopram  10 mg Oral QHS  . feeding supplement (ENSURE ENLIVE)  237 mL Oral BID BM  . furosemide  80 mg Intravenous Q8H  . gabapentin  200 mg Oral TID  . insulin aspart  0-20 Units Subcutaneous TID WC  . insulin aspart  0-5 Units Subcutaneous QHS  . insulin aspart protamine- aspart  10 Units Subcutaneous BID WC  . linagliptin  5 mg Oral Daily  . metFORMIN  1,000 mg Oral BID WC  . polyethylene glycol  17 g Oral Daily  . potassium chloride  20 mEq Oral Q8H  . pravastatin  10 mg Oral q1800  . sodium chloride flush  10-40 mL Intracatheter Q12H  . spironolactone  50 mg Oral Daily    Vital Signs    Filed Vitals:   04/21/16 2043 04/22/16 0013 04/22/16 0413 04/22/16 0958  BP:  116/52  93/61 98/47  Pulse:  68 67 65  Temp:  98.5 F (36.9 C) 97.8 F (36.6 C)   TempSrc:  Oral Oral   Resp:  20 20   Height:      Weight:   240 lb (108.863 kg)   SpO2: 99% 99% 100% 98%    Intake/Output Summary (Last 24 hours) at 04/22/16 1044 Last data filed at 04/22/16 0814  Gross per 24 hour  Intake    860 ml  Output   1350 ml  Net   -490 ml   Filed Weights   04/21/16 0500 04/21/16 1307 04/22/16 0413  Weight: 245 lb 9.5 oz (111.4 kg) 242 lb 14.4 oz (110.179 kg) 240 lb (108.863 kg)    Physical Exam    General: Obese Caucasian female appearing in no acute distress. Head: Normocephalic, atraumatic.  Neck: Supple without bruits, JVD not elevated. Lungs:  Resp regular and unlabored, CTA without wheezing or rales. Heart: RRR, S1, S2, no S3, S4, or murmur; no rub. Abdomen: Soft, non-tender, non-distended with normoactive bowel sounds. No hepatomegaly. No rebound/guarding. No obvious abdominal masses. Extremities: No clubbing or cyanosis, 1+ edema bilaterally. Distal pedal pulses are 2+ bilaterally. Neuro: Alert and oriented X 3. Moves all extremities spontaneously. Psych:  Normal affect.  Labs    CBC  Recent Labs  04/20/16 0420  WBC 14.6*  HGB 11.0*  HCT 38.9  MCV 88.8  PLT [id number]*   Basic Metabolic Panel  Recent Labs  04/20/16 0420 04/21/16 0438  NA 135  --   K 4.3  --   CL 89*  --   CO2 36*  --   GLUCOSE 145*  --   BUN 18  --   CREATININE 0.79 0.78  CALCIUM 10.6*  --     Telemetry    NSR, HR in 60's - 70's.   ECG    No new tracings.   Cardiac Studies and Radiology    Dg Orthopantogram: 04/15/2016  CLINICAL DATA:  60 year old female with cardiac valvular disease and CHF. Poor dentition. Initial encounter. EXAM: ORTHOPANTOGRAM/PANORAMIC COMPARISON:  CTA neck 02/01/2016 FINDINGS: Due to motion artifact on this exam despite repeated attempts detail of the central mandibular dentition is inferior to that on 02/01/2016. Maxillary dentition is absent. No definite  dental caries or periapical dental lucency is identified. The bilateral mandibular molars are absent. IMPRESSION: Due to motion and difficulty with this exam, dental detail is better on the 02/01/2016 Neck CTA. Maxillary dentition is absent. No definite acute mandible dental inflammation. Electronically Signed   By: Genevie Ann M.D.   On: 04/15/2016 07:41   Dg Chest 2 View: 04/15/2016  CLINICAL DATA:  CHF due to valvular disease. EXAM: CHEST  2 VIEW COMPARISON:  04/14/2016 FINDINGS: Mild progression of diffuse bilateral airspace disease consistent with edema. Right lower lobe volume loss and right effusion unchanged. No significant effusion on the left. Right jugular central venous catheter tip in the SVC unchanged. IMPRESSION: Diffuse bilateral pulmonary edema with mild progression Right lower lobe volume loss and small right effusion unchanged. Electronically Signed   By: Franchot Gallo M.D.   On: 04/15/2016 07:38   Dg Chest Port 1 View: 04/14/2016  CLINICAL DATA:  Post PICC line placement. EXAM: PORTABLE CHEST 1 VIEW COMPARISON:  02/12/2016 FINDINGS: Examination demonstrates interval placement of right central venous catheter with tip over the SVC. No evidence of pneumothorax. The Lungs are hypoinflated with moderate perihilar bibasilar opacification likely interstitial edema, although cannot exclude infection. Likely small right pleural effusion. Stable cardiomegaly. There is calcified plaque over the aortic arch. Remainder of the exam is unchanged. IMPRESSION: Moderate perihilar and bibasilar opacification which may be due to interstitial edema versus infection. Small right pleural effusion. Stable cardiomegaly. Right IJ central venous catheter with tip over the SVC. No pneumothorax. Electronically Signed   By: Marin Olp M.D.   On: 04/14/2016 22:02    Assessment & Plan    1. Acute on chronic diastolic heart failure -  Pt's weight at 240 lbs today (dry weight 240-245lbs) with net negative -9.0L since  admission.  - Patient has been mildly hypotensive and is approaching her dry weight. Will change Lasix dosing from 80mg  TID to 80mg  BID.  - wean O2 as tolerated. - continue BB and Aldactone. Consider addition of ACE-I prior to discharge if BP will allow.  2. 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 Later came back with embolic CVA, confirmed mitral valve vegetation.Seen by Dr. Roxy Manns and felt to be a poor surgical candidate in her current state. Neurology recommended delay of surgery until at least Sept (6 months following CVA/SAH). Per Dr. Roxy Manns, will optimize her volume status, later may  consider L&RHC, PFTs w/ diffusion capacity, ABG on room air, MRI brain, CT angio head and dental consult before high risk surgery.  - pt refusing MRI brain. Is agreeable to have CT Angio Head today (will need PRN Klonopin 20 minutes prior to imaging study). Will plan for PFT's today or tomorrow and Right/Left heart cath tomorrow. - Infectious Disease following. Blood cultures from 5/24 final result is negative. IV Abx discontinued.  3. 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  4. Essential hypertension  - BP has been 93/47 - 116/63 in the past 24 hours. IV Lasix dosing decreased. - Continue Atenolol 25mg  BID and Aldactone 50mg  daily. May require further reduction if hypotension persists.  5. Hyperlipidemia - continue Pravastatin 10mg   6. Type 2 DM - on metformin, linagliptin, and SSI  7. Anxiety - consulted psychiatry on 5/25 who started her on 0.5mg  BID klonipin with 0.25mg  TID PRN klonipin, lexapro, and gabapentin. - appreciate Psychiatry recommendations.  Arna Medici , PA-C 10:44 AM 04/22/2016 Pager: [phone]    I have personally seen and examined this patient with Bernerd Pho, PA-C I agree with the assessment and plan  as outlined above. She has severe MR with evidence of endocarditis and stroke with splenic infarct. Cultures are negative this admission. Cultures from march 2017 with strept viridans. ID following and antibiotics have been stopped. Will continue to optimize for MVR. Dr. Roxy Manns has seen her.  She has diuresed well with IV Lasix but still with mild volume overload. Will lower Lasix to BID today. She is refusing MRI of her brain. She is willing to schedule CTA head today per recs of Neuro/CT surgery. Will plan PFTS over next 48 hours. Right and left heart cath tomorrow.    Lauree Chandler 04/22/2016 11:25 AM

## 2016-04-23 NOTE — Progress Notes (Signed)
Pt. Requesting water after being told she is NPO for procedure on tomorrow. Pt. Grabbed spray bottle at bedside and began spraying her mouth with water. Spray bottle removed from patients reach.

## 2016-04-23 NOTE — Progress Notes (Addendum)
Received nurse page for concern for atrial flutter on telemetry. Strips reviewed. I can see where it appears to be flutter at times but the cadence of the rate never changes at all and there are interruptions during this rhythm that are very inconsistent jumping around and appear more like artifact. I reviewed with Dr. Radford Pax who agrees as well. I will still go ahead and send off a stat BMET and Mg to assess electrolytes in case she has any more suspicious telemetry strips in light of elevated potassium level this AM. The nurse has appropriately held further potassium supplementation - will d/c KCl supplement off MAR for now and f/u level in AM as well. Forbes Loll PA-C  Addendum: nurse reports when they went in to do EKG to clarify rhythm one of the EKG leads was not attached appropriately and patient was shaking her leg which would correlate with artifact. Sydny Schnitzler PA-C

## 2016-04-23 NOTE — Progress Notes (Signed)
Pt. Requesting to sit on side of bed. RN informed pt. That she needs the assistance of two people to help with sitting her on the side of bed to make sure she is positioned properly. Pt. Informed that she will be assisted as soon as two people were available. Pt. Stated "I will do it myself if you don't hurry up". Pt. Informed she is a fall risk and needs to wait for help to assist her.  

## 2016-04-23 NOTE — Progress Notes (Signed)
Pt in cath holding post cath. Awake alert, oriented, no c/o pain. Report called to Mingo Amber by Marjory Lies. Rt pt,dp palpable. Rt groin with RFA/RFV sheaths level zero.

## 2016-04-23 NOTE — Progress Notes (Signed)
Notified by CCMD of abnormal HR. Paged PA D. Dunn. Orders to obtain EKG if sustained. Will continue to monitor.

## 2016-04-23 NOTE — Progress Notes (Signed)
SUBJECTIVE: No dyspnea  BP 108/44 mmHg  Pulse 76  Temp(Src) 97.9 F (36.6 C) (Oral)  Resp 18  Ht 5\' 5"  (1.651 m)  Wt 232 lb (105.235 kg)  BMI 38.61 kg/m2  SpO2 94%  Intake/Output Summary (Last 24 hours) at 04/23/16 1012 Last data filed at 04/23/16 0901  Gross per 24 hour  Intake    500 ml  Output    950 ml  Net   -450 ml    PHYSICAL EXAM General: Well developed, well nourished, in no acute distress. Alert and oriented x 3.  Psych:  Good affect, responds appropriately Neck: No JVD. No masses noted.  Lungs: Clear bilaterally with no wheezes or rhonci noted.  Heart: RRR with systolic murmur noted. Abdomen: Bowel sounds are present. Soft, non-tender.  Extremities: 1+ bilateral lower extremity edema.   LABS: Basic Metabolic Panel:  Recent Labs  04/21/16 0438 04/23/16 0420  NA  --  135  K  --  5.2*  CL  --  86*  CO2  --  40*  GLUCOSE  --  115*  BUN  --  25*  CREATININE 0.78 0.89  CALCIUM  --  11.6*   CBC:  Recent Labs  04/23/16 0420  WBC 14.4*  NEUTROABS 9.9*  HGB 11.3*  HCT 40.1  MCV 89.5  PLT 676*   Current Meds: . [MAR Hold] albuterol  2.5 mg Nebulization QHS  . [MAR Hold] antiseptic oral rinse  7 mL Mouth Rinse BID  . [MAR Hold] aspirin  81 mg Oral Daily  . [MAR Hold] atenolol  25 mg Oral BID  . [MAR Hold] cholecalciferol  1,000 Units Oral BID  . [MAR Hold] clonazePAM  0.5 mg Oral BID  . [MAR Hold] enoxaparin (LOVENOX) injection  40 mg Subcutaneous Q24H  . [MAR Hold] escitalopram  10 mg Oral QHS  . [MAR Hold] feeding supplement (ENSURE ENLIVE)  237 mL Oral BID BM  . [MAR Hold] furosemide  80 mg Intravenous BID  . [MAR Hold] gabapentin  200 mg Oral TID  . [MAR Hold] insulin aspart  0-20 Units Subcutaneous TID WC  . [MAR Hold] insulin aspart  0-5 Units Subcutaneous QHS  . [MAR Hold] insulin aspart protamine- aspart  10 Units Subcutaneous BID WC  . [MAR Hold] linagliptin  5 mg Oral Daily  . [MAR Hold] metFORMIN  1,000 mg Oral BID WC  . [MAR  Hold] polyethylene glycol  17 g Oral Daily  . [MAR Hold] potassium chloride  20 mEq Oral Q8H  . [MAR Hold] pravastatin  10 mg Oral q1800  . [MAR Hold] sodium chloride flush  10-40 mL Intracatheter Q12H  . sodium chloride flush  3 mL Intravenous Q12H  . [MAR Hold] spironolactone  50 mg Oral Daily   CTA head: 04/22/16: IMPRESSION: 1. Expected evolution of multi focal nonhemorrhagic infarcts in the posterior right MCA territory. 2. Mild diffuse distal small vessel disease without a significant proximal stenosis, aneurysm, or branch vessel occlusion.  ASSESSMENT AND PLAN:  1. Acute on chronic diastolic heart failure: She has diuresed well (9.5 liters since admission). Still with elevated filling pressures by cath today. Will continue IV Lasix today. Continue Beta blocker and Aldactone.   2. 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 Later came back with embolic CVA, confirmed mitral valve vegetation.Seen by Dr. Roxy Manns and felt to be a poor surgical candidate in her current state  but would consider surgery if we could optimize her medical conditions.  Cath today with normal coronary arteries. Elevated filling pressures. Severe elevation PA pressures. CTA head completed with expected findings post CVA. ID has signed off. Abx stopped. Blood cultures from 5/24 final result is negative. Ultimate decision for planning for mitral valve surgery will be up to Dr. Roxy Manns.   3. 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 Essential hypertension: BP stable.   5. Hyperlipidemia: continue Pravastatin 10mg   6. Type 2 DM: on metformin, linagliptin, and SSI  7. Anxiety: consulted psychiatry on 5/25 who started her on 0.5mg  BID klonipin with 0.25mg  TID PRN klonipin, lexapro, and gabapentin.  Margaret [name]  6/1/201710:12 AM

## 2016-04-23 NOTE — Progress Notes (Signed)
   04/23/16 0900  OT Visit Information  Last OT Received On 04/23/16  Assistance Needed +2  Reason Eval/Treat Not Completed Patient at procedure or test/ unavailable  History of Present Illness Margaret [name] is a 60 y.o. female admitted with acute  CHF exacerbation.  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    Pt at surgery for Right/Left Heart Cath and Coronary Angiography (N/A). Will follow-up tomorrow and see when medically appropriate.   Tyrone Schimke OTR/L Pager: [phone]

## 2016-04-23 NOTE — Interval H&P Note (Signed)
History and Physical Interval Note:  04/23/2016 9:10 AM  [name]  has presented today for surgery, with the diagnosis of mr  The various methods of treatment have been discussed with the patient and family. After consideration of risks, benefits and other options for treatment, the patient has consented to  Procedure(s): Right/Left Heart Cath and Coronary Angiography (N/A) as a surgical intervention .  [name] history has been reviewed, patient examined, no change in status, stable for surgery.  I have reviewed the patient's chart and labs.  Questions were answered to the patient's satisfaction.     Margaret [name]

## 2016-04-23 NOTE — Progress Notes (Addendum)
Site area: RFA/RFV Site Prior to Removal:  Level 0 Pressure Applied For:60min Manual: yes   Patient Status During Pull:  stable Post Pull Site:  Level 0 Post Pull Instructions Given:  yes Post Pull Pulses Present: palpable Dressing Applied:  tegaderm Bedrest begins @ 1045 till [id number] Comments: returned to Margaret [name]

## 2016-04-23 NOTE — Progress Notes (Signed)
Pt. Requesting sleeping pill. Pt. Refused earlier x2.  RN informed pt. It was now to late to administer sleeping pill.

## 2016-04-23 NOTE — Progress Notes (Signed)
Pt. Yelling out loudly. RN called to pts. bedside to check on pt. Pt. Stated that she wanted to be moved up in bed. NT had pt. Use her stronger side to assist with getting up in bed while in trendelenburg position. Pt. Stated that her arm was hurt while she was being repositioned. NT stated that pts. Arm was not touched by her as the pt. Was able to pull up in bed herself. Pt. Has been repositioned multiple times during the night. Pt. Also stated NT let her fall the previous night. NT present with RN at bedside and NT reassured pt. That she has not had a fall during this admission while on this unit.

## 2016-04-24 ENCOUNTER — Inpatient Hospital Stay (HOSPITAL_COMMUNITY): Payer: Medicaid Other

## 2016-04-24 LAB — GLUCOSE, CAPILLARY
Glucose-Capillary: 168 mg/dL — ABNORMAL HIGH (ref 65–99)
Glucose-Capillary: 116 mg/dL — ABNORMAL HIGH (ref 65–99)
Glucose-Capillary: 202 mg/dL — ABNORMAL HIGH (ref 65–99)
Glucose-Capillary: 126 mg/dL — ABNORMAL HIGH (ref 65–99)

## 2016-04-24 LAB — SPIROMETRY WITH GRAPH
FEF 25-75 POST: 0.83 L/s
FEF 25-75 Pre: 0.49 L/sec
FEF2575-%Change-Post: 67 %
FEF2575-%PRED-POST: 33 %
FEF2575-%PRED-PRE: 19 %
FEV1-%CHANGE-POST: 13 %
FEV1-%PRED-PRE: 26 %
FEV1-%Pred-Post: 29 %
FEV1-POST: 0.82 L
FEV1-Pre: 0.72 L
FEV1FVC-%CHANGE-POST: 8 %
FEV1FVC-%PRED-PRE: 94 %
FEV6-%CHANGE-POST: 5 %
FEV6-%PRED-PRE: 28 %
FEV6-%Pred-Post: 29 %
FEV6-POST: 1.03 L
FEV6-Pre: 0.98 L
FEV6FVC-%PRED-POST: 103 %
FEV6FVC-%Pred-Pre: 103 %
FVC-%Change-Post: 5 %
FVC-%Pred-Post: 28 %
FVC-%Pred-Pre: 27 %
FVC-PRE: 0.98 L
FVC-Post: 1.03 L
POST FEV6/FVC RATIO: 100 %
PRE FEV1/FVC RATIO: 74 %
Post FEV1/FVC ratio: 80 %
Pre FEV6/FVC Ratio: 100 %

## 2016-04-24 LAB — BASIC METABOLIC PANEL
Anion gap: 11 (ref 5–15)
BUN: 23 mg/dL — ABNORMAL HIGH (ref 6–20)
CALCIUM: 11.3 mg/dL — AB (ref 8.9–10.3)
CO2: 38 mmol/L — AB (ref 22–32)
CREATININE: 0.87 mg/dL (ref 0.44–1.00)
Chloride: 90 mmol/L — ABNORMAL LOW (ref 101–111)
GFR calc non Af Amer: >60 mL/min (ref >60)
Glucose, Bld: 164 mg/dL — ABNORMAL HIGH (ref 65–99)
Potassium: 4.5 mmol/L (ref 3.5–5.1)
Sodium: 139 mmol/L (ref 135–145)

## 2016-04-24 MED ORDER — INSULIN ASPART 100 UNIT/ML ~~LOC~~ SOLN
0.0000 [IU] | Freq: Three times a day (TID) | SUBCUTANEOUS | Status: DC
  Administered 2016-04-25: 3 [IU] via SUBCUTANEOUS
  Administered 2016-04-25 – 2016-04-26 (×3): 7 [IU] via SUBCUTANEOUS
  Administered 2016-04-27: 3 [IU] via SUBCUTANEOUS
  Administered 2016-04-27: 4 [IU] via SUBCUTANEOUS
  Administered 2016-04-27: 3 [IU] via SUBCUTANEOUS
  Administered 2016-04-28: 4 [IU] via SUBCUTANEOUS
  Administered 2016-04-28: 7 [IU] via SUBCUTANEOUS
  Administered 2016-04-29 (×3): 3 [IU] via SUBCUTANEOUS

## 2016-04-24 MED ORDER — FUROSEMIDE 80 MG PO TABS
80.0000 mg | ORAL_TABLET | Freq: Two times a day (BID) | ORAL | Status: DC
  Administered 2016-04-24 – 2016-04-29 (×10): 80 mg via ORAL
  Filled 2016-04-24 (×10): qty 1

## 2016-04-24 NOTE — Progress Notes (Signed)
SUBJECTIVE: Breathing is better. No pain.   BP 98/55 mmHg  Pulse 70  Temp(Src) 97.8 F (36.6 C) (Oral)  Resp 18  Ht 5\' 5"  (1.651 m)  Wt 235 lb 12.8 oz (106.958 kg)  BMI 39.24 kg/m2  SpO2 98%  Intake/Output Summary (Last 24 hours) at 04/24/16 1025 Last data filed at 04/24/16 1008  Gross per 24 hour  Intake    840 ml  Output    952 ml  Net   -112 ml    PHYSICAL EXAM General: Well developed, well nourished, in no acute distress. Alert and oriented x 3.  Psych:  Good affect, responds appropriately Neck: No JVD. No masses noted.  Lungs: Clear bilaterally with no wheezes or rhonci noted.  Heart: RRR with systolic murmurs noted. Abdomen: Bowel sounds are present. Soft, non-tender.  Extremities: trace bilateral lower extremity edema with chronic venous stasis changes both lower extremities  LABS: Basic Metabolic Panel:  Recent Labs  04/23/16 2015 04/24/16 0420  NA 134* 139  K 4.3 4.5  CL 88* 90*  CO2 36* 38*  GLUCOSE 178* 164*  BUN 23* 23*  CREATININE 0.91 0.87  CALCIUM 11.3* 11.3*  MG 2.0  --    CBC:  Recent Labs  04/23/16 0420  WBC 14.4*  NEUTROABS 9.9*  HGB 11.3*  HCT 40.1  MCV 89.5  PLT 676*   Current Meds: . albuterol  2.5 mg Nebulization QHS  . antiseptic oral rinse  7 mL Mouth Rinse BID  . aspirin  81 mg Oral Daily  . atenolol  25 mg Oral BID  . cholecalciferol  1,000 Units Oral BID  . clonazePAM  0.5 mg Oral BID  . enoxaparin (LOVENOX) injection  40 mg Subcutaneous Q24H  . escitalopram  10 mg Oral QHS  . feeding supplement (ENSURE ENLIVE)  237 mL Oral BID BM  . furosemide  80 mg Intravenous BID  . gabapentin  200 mg Oral TID  . insulin aspart  0-20 Units Subcutaneous TID WC  . insulin aspart  0-5 Units Subcutaneous QHS  . insulin aspart protamine- aspart  10 Units Subcutaneous BID WC  . linagliptin  5 mg Oral Daily  . metFORMIN  1,000 mg Oral BID WC  . polyethylene glycol  17 g Oral Daily  . pravastatin  10 mg Oral q1800  . sodium  chloride flush  10-40 mL Intracatheter Q12H  . sodium chloride flush  3 mL Intravenous Q12H  . sodium chloride flush  3 mL Intravenous Q12H  . spironolactone  50 mg Oral Daily     ASSESSMENT AND PLAN:    1. Acute on chronic diastolic heart failure: She has diuresed well (9.5 liters since admission). I/O even last 24 hours. She appears to be near a euvolemic state. Will change to po Lasix today. Continue beta blocker and Aldactone.   2. 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 Later came back with embolic CVA, confirmed mitral valve vegetation. Seen by Dr. Roxy Manns and felt to be a poor surgical candidate but he did feel that she could be considered for MVR if we could optimize her acute and chronic issues. Cath 04/23/16 with normal coronary arteries. Elevated filling pressures. Severe elevation PA pressures. CTA head completed with expected findings post CVA. ID has signed off. Abx stopped. Blood cultures from 5/24 final result is negative.  -Ultimate decision for planning for mitral valve surgery will  be up to Dr. Roxy Manns. Will await recs from Dr. Roxy Manns. If no plans for surgery this admission, will likely need SNF.   3. 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 Essential hypertension: BP stable.   5. Hyperlipidemia: continue Pravastatin 10mg   6. Type 2 DM: on metformin, linagliptin, and SSI  7. Anxiety: consulted psychiatry on 5/25 who started her on 0.5mg  BID klonipin with 0.25mg  TID PRN klonipin, lexapro, and gabapentin.  Lauree Chandler  6/2/201710:25 AM

## 2016-04-24 NOTE — Progress Notes (Signed)
   04/24/16 1400  Clinical Encounter Type  Visited With Patient  Visit Type Follow-up  Referral From [place]  Ch referred by colleague to visit with pt; pt observed to have a little trouble with breathing but able to communicate with [facility]; pt worried about possible open heart surgery and has some anxiety about procedure; she feels she is getting stronger; prayer and spiritual support provided. Gwynn Burly 2:25 PM

## 2016-04-24 NOTE — Progress Notes (Signed)
Rehab Admissions Coordinator Note:  Patient was screened by Cleatrice Burke for appropriateness for an Inpatient Acute Rehab Consult per PT recommendation. Familiar with this pt form 02/11/16 when Dr. Posey Pronto with Inpatient rehab services consulted on a possible inpt rehab admission. Pt discharged to [facility] to receive prolonged IV antibiotics and rehab. Spouse only able to provide supervision level at home due to back issues. Noted ongoing discussions with Dr. Ricard Dillon concerning possible surgery.Would need clearer goals of care before determining most appropriate rehab venue.   Cleatrice Burke 04/24/2016, 3:12 PM  I can be reached at [phone].

## 2016-04-24 NOTE — Clinical Social Work Note (Addendum)
CSW called and left a voicemail with Derenda Mis, admissions coordinator at [facility] in [place] asking if they accept LOG's. If not, patient will return home with husband.  Dayton Scrape, CSW [phone]  2:17 pm Per other CSW, [facility] does not accept LOG's. CSW notified patient and she said the only other option is to go home. CSW will notify RNCM. CSW called patient's husband and left a voicemail with contact information.  Dayton Scrape, [place]

## 2016-04-24 NOTE — Progress Notes (Signed)
Physical Therapy Treatment Patient Details Name: Margaret [name] MRN: [id number] DOB: [birth year] Today's Date: 04/24/2016    History of Present Illness ELVINE LAUINGER is a 60 y.o. female admitted with acute  CHF exacerbation.  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 Working up for ?MVR    PT Comments    Noted patient came from SNF (where she reports she received little therapy--likely due to insurance issues). She is highly motivated to work with therapies and has made progress. Noted she cannot go to the SNF of her choice ([facility]) with LOG (per Social Work note). Timing of possible valve replacement surgery is unclear. Question if she will have surgery this admission or if MD plans to delay surgery for further therapies/strengthening prior to surgery? Recommend Inpatient Rehab screen if valve surgery will be delayed.    Follow Up Recommendations  CIR     Equipment Recommendations  Wheelchair; Wheelchair cushion; Hospital bed; hoyer lift; if ramp not completed, ambulance transport    Recommendations for Other Services Rehab consult     Precautions / Restrictions Precautions Precautions: Fall Precaution Comments: L hemiparesis    Mobility  Bed Mobility Overal bed mobility: Needs Assistance Bed Mobility: Rolling;Sidelying to Sit Rolling: Max assist (to Rt with RUE on rail) Sidelying to sit: Max assist;+2 for physical assistance;+2 for safety/equipment       General bed mobility comments: able to assist with RUE much more with exiting to her right  Transfers Overall transfer level: Needs assistance   Transfers: Sit to/from Stand;Stand Pivot Transfers Sit to Stand: Mod assist;Min assist;From elevated surface;+2 safety/equipment Stand pivot  transfers:  (pivot via stedy)       General transfer comment: stood x 4 with varying assist depending on surface height; with bariatric stedy requires 2 people due to lt lean and to manage LUE (to not get caught on equipment)  Ambulation/Gait             General Gait Details: unable; tried to side step to right with pt "shimmying" on rt foot   Stairs            Wheelchair Mobility    Modified Rankin (Stroke Patients Only) Modified Rankin (Stroke Patients Only) Pre-Morbid Rankin Score: Severe disability Modified Rankin: Severe disability     Balance     Sitting balance-Leahy Scale: Good Sitting balance - Comments: reaching forward and rt outside her BOS   Standing balance support: Single extremity supported Standing balance-Leahy Scale: Poor Standing balance comment: left lean with inattention; with vc can correct                    Cognition Arousal/Alertness: Awake/alert Behavior During Therapy: WFL for tasks assessed/performed Overall Cognitive Status: History of cognitive impairments - at baseline                      Exercises Other Exercises Other Exercises: pt continues to have poor attention to LUE with it ending up in poor position or nearly getting caught between her and stedy    General Comments General comments (skin integrity, edema, etc.): Patient educated on pro's and con's of using stedy with nursing assist. Pt has had 2 near falls (per pt) with nursing during stand-pivots. Pt very appreciative of option and  likes stedy      Pertinent Vitals/Pain Pain Assessment: No/denies pain    Home Living                      Prior Function            PT Goals (current goals can now be found in the care plan section) Acute Rehab PT Goals Patient Stated Goal: to regain independence  PT Goal Formulation: With patient Time For Goal Achievement: 05/06/16 Potential to Achieve Goals: Good Progress towards PT goals:  Progressing toward goals (updated due to timeframe)    Frequency  Min 4X/week    PT Plan Discharge plan needs to be updated;Frequency needs to be updated    Co-evaluation             End of Session Equipment Utilized During Treatment: Gait belt Activity Tolerance: Patient tolerated treatment well Patient left: with call bell/phone within reach;in chair     Time: 1116-1203 PT Time Calculation (min) (ACUTE ONLY): 47 min  Charges:  $Therapeutic Activity: 38-52 mins                    G Codes:      Njeri Vicente 04/26/2016, 2:56 PM Pager [phone]

## 2016-04-24 NOTE — Progress Notes (Signed)
Chaplain was requested by friends of the patient to give the patient a visit. The patient was/is very anxious about an upcoming surgery. Patient asked for prayer which was offered on her behalf. Chaplain has informed team members in charge of her unit of the patient and suggests a follow up visit.  Graciela Husbands Pager: [phone]    04/24/16 1300  Clinical Encounter Type  Visited With Patient  Visit Type Initial;Spiritual support  Referral From Other (Comment)  Consult/Referral To (Friends asked for me to visit)  Spiritual Encounters  Spiritual Needs Prayer;Emotional

## 2016-04-25 DIAGNOSIS — I340 Nonrheumatic mitral (valve) insufficiency: Secondary | ICD-10-CM

## 2016-04-25 LAB — GLUCOSE, CAPILLARY
Glucose-Capillary: 231 mg/dL — ABNORMAL HIGH (ref 65–99)
Glucose-Capillary: 176 mg/dL — ABNORMAL HIGH (ref 65–99)
Glucose-Capillary: 133 mg/dL — ABNORMAL HIGH (ref 65–99)
Glucose-Capillary: 133 mg/dL — ABNORMAL HIGH (ref 65–99)
Glucose-Capillary: 168 mg/dL — ABNORMAL HIGH (ref 65–99)

## 2016-04-25 LAB — CBC
HEMATOCRIT: 38.2 % (ref 36.0–46.0)
HEMOGLOBIN: 10.6 g/dL — AB (ref 12.0–15.0)
MCH: 24.7 pg — AB (ref 26.0–34.0)
MCHC: 27.7 g/dL — AB (ref 30.0–36.0)
MCV: 88.8 fL (ref 78.0–100.0)
Platelets: 579 10*3/uL — ABNORMAL HIGH (ref 150–400)
RBC: 4.3 MIL/uL (ref 3.87–5.11)
RDW: 16.4 % — ABNORMAL HIGH (ref 11.5–15.5)
WBC: 13.3 10*3/uL — ABNORMAL HIGH (ref 4.0–10.5)

## 2016-04-25 LAB — CARBOXYHEMOGLOBIN
Carboxyhemoglobin: 1.2 % (ref 0.5–1.5)
Methemoglobin: 0.8 % (ref 0.0–1.5)
O2 Saturation: 78.6 %
Total hemoglobin: 11.2 g/dL — ABNORMAL LOW (ref 12.0–16.0)

## 2016-04-25 LAB — BASIC METABOLIC PANEL
ANION GAP: 10 (ref 5–15)
BUN: 22 mg/dL — ABNORMAL HIGH (ref 6–20)
CO2: 38 mmol/L — AB (ref 22–32)
Calcium: 11 mg/dL — ABNORMAL HIGH (ref 8.9–10.3)
Chloride: 90 mmol/L — ABNORMAL LOW (ref 101–111)
Creatinine, Ser: 0.79 mg/dL (ref 0.44–1.00)
GFR calc Af Amer: >60 mL/min (ref >60)
GFR calc non Af Amer: >60 mL/min (ref >60)
GLUCOSE: 169 mg/dL — AB (ref 65–99)
POTASSIUM: 4.5 mmol/L (ref 3.5–5.1)
Sodium: 138 mmol/L (ref 135–145)

## 2016-04-25 NOTE — Progress Notes (Signed)
Patient Name: Margaret [name] Date of Encounter: 04/25/2016  Principal Problem:   Acute on chronic diastolic congestive heart failure due to valvular disease [facility]) Active Problems:   Essential hypertension   Endocarditis of mitral valve   Mitral regurgitation   HLD (hyperlipidemia)   Type 2 diabetes mellitus with circulatory disorder (HCC)   History of stroke   CHF due to valvular disease, acute on chronic, diastolic   Other depression due to general medical condition   Panic disorder without agoraphobia with severe panic attacks   Length of Stay: 11  SUBJECTIVE  Still with mild dyspnea at rest, but better. Afebrile. WBC a little lower.  CURRENT MEDS . albuterol  2.5 mg Nebulization QHS  . antiseptic oral rinse  7 mL Mouth Rinse BID  . aspirin  81 mg Oral Daily  . atenolol  25 mg Oral BID  . cholecalciferol  1,000 Units Oral BID  . clonazePAM  0.5 mg Oral BID  . enoxaparin (LOVENOX) injection  40 mg Subcutaneous Q24H  . escitalopram  10 mg Oral QHS  . feeding supplement (ENSURE ENLIVE)  237 mL Oral BID BM  . furosemide  80 mg Oral BID  . gabapentin  200 mg Oral TID  . insulin aspart  0-20 Units Subcutaneous TID WC  . insulin aspart  0-5 Units Subcutaneous QHS  . insulin aspart protamine- aspart  10 Units Subcutaneous BID WC  . linagliptin  5 mg Oral Daily  . metFORMIN  1,000 mg Oral BID WC  . polyethylene glycol  17 g Oral Daily  . pravastatin  10 mg Oral q1800  . sodium chloride flush  10-40 mL Intracatheter Q12H  . sodium chloride flush  3 mL Intravenous Q12H  . sodium chloride flush  3 mL Intravenous Q12H  . spironolactone  50 mg Oral Daily    OBJECTIVE   Intake/Output Summary (Last 24 hours) at 04/25/16 1134 Last data filed at 04/25/16 0600  Gross per 24 hour  Intake    730 ml  Output   1001 ml  Net   -271 ml   Filed Weights   04/23/16 0444 04/24/16 0427 04/25/16 0540  Weight: 105.235 kg (232 lb) 106.958 kg (235 lb 12.8 oz) 107.366 kg (236 lb 11.2  oz)    PHYSICAL EXAM Filed Vitals:   04/24/16 1100 04/24/16 2108 04/24/16 2237 04/25/16 0540  BP: 116/53  102/46 100/53  Pulse: 67  73 81  Temp:   97.7 F (36.5 C) 98.6 F (37 C)  TempSrc:   Oral Oral  Resp: 18  18 20   Height:      Weight:    107.366 kg (236 lb 11.2 oz)  SpO2: 100% 97% 98% 96%   General: Alert, oriented x3, no distress Head: no evidence of trauma, PERRL, EOMI, no exophtalmos or lid lag, no myxedema, no xanthelasma; normal ears, nose and oropharynx Neck: normal jugular venous pulsations and no hepatojugular reflux; brisk carotid pulses without delay and no carotid bruits Chest: clear to auscultation, no signs of consolidation by percussion or palpation, normal fremitus, symmetrical and full respiratory excursions Cardiovascular: normal position and quality of the apical impulse, regular rhythm, normal first and second heart sounds, no rubs or gallops, [id number] holosystolic murmur, no diastolic murmur Abdomen: no tenderness or distention, no masses by palpation, no abnormal pulsatility or arterial bruits, normal bowel sounds, no hepatosplenomegaly Extremities: no clubbing, cyanosis or edema; 2+ radial, ulnar and brachial pulses bilaterally; 2+ right femoral, posterior tibial and dorsalis pedis  pulses; 2+ left femoral, posterior tibial and dorsalis pedis pulses; no subclavian or femoral bruits Neurological: left leg paresis  LABS  CBC  Recent Labs  04/23/16 0420 04/25/16 0433  WBC 14.4* 13.3*  NEUTROABS 9.9*  --   HGB 11.3* 10.6*  HCT 40.1 38.2  MCV 89.5 88.8  PLT 676* [id number]*   Basic Metabolic Panel  Recent Labs  04/23/16 2015 04/24/16 0420 04/25/16 0433  NA 134* 139 138  K 4.3 4.5 4.5  CL 88* 90* 90*  CO2 36* 38* 38*  GLUCOSE 178* 164* 169*  BUN 23* 23* 22*  CREATININE 0.91 0.87 0.79  CALCIUM 11.3* 11.3* 11.0*  MG 2.0  --   --     Radiology Studies Imaging results have been reviewed and No results found.  TELE NSR   ASSESSMENT AND PLAN  1.  Acute on chronic diastolic heart failure: She has diuresed well (>10L since admission). Still with improving renal function. Will continue IV Lasix today. Continue Beta blocker and Aldactone.   2. 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 Later came back with embolic CVA, confirmed mitral valve vegetation. Cath with normal coronary arteries. Elevated filling pressures. Severe elevation PA pressures, but with trans pulmonary gradient fairly low. Abx stopped. Blood cultures from 5/24 final result is negative.  Plan for MVR bioprosthesis on Thursday.  3. 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 Essential hypertension: BP stable.   5. Hyperlipidemia: on Pravastatin   6. Type 2 DM: on metformin, linagliptin, and SSI  7. Anxiety: seems compensated on benzodiazepines   Sanda Klein, MD, [facility] [phone] office [phone] pager 04/25/2016 11:34 AM

## 2016-04-25 NOTE — Progress Notes (Signed)
Pt is hanging her leg out of the bed and refusing to keep it in the bed and will not allow me to raise the side rail. Pt informed that it is not safe and is still refusing. Bed alarm is activated.

## 2016-04-25 NOTE — Progress Notes (Signed)
[address]       [place] [address]             [phone]        CARDIOTHORACIC SURGERY PROGRESS NOTE   R2 Days Post-Op Procedure(s) (LRB): Right/Left Heart Cath and Coronary Angiography (N/A)  Subjective: Patient looks and feels well.  Breathing has continued to improve.  Energy level improved.  Working with PT.  No fevers.  + mild constipation.  No cough.  No dysuria.  Some pain in left shoulder.  Objective: Vital signs: BP Readings from Last 1 Encounters:  04/25/16 100/53   Pulse Readings from Last 1 Encounters:  04/25/16 81   Resp Readings from Last 1 Encounters:  04/25/16 20   Temp Readings from Last 1 Encounters:  04/25/16 98.6 F (37 C) Oral    Hemodynamics: Mixed venous co-ox 78.6%%  Physical Exam:  Rhythm:   sinus  Breath sounds: Few bibasilar crackles  Heart sounds:  RRR w/ holosystolic murmur  Incisions:  n/a  Abdomen:  soft  Extremities:  Warm, edema resolved   Intake/Output from previous day: 06/02 0701 - 06/03 0700 In: 1090 [P.O.:1080; I.V.:10] Out: 1802 [Urine:1800; Stool:2] Intake/Output this shift:    Lab Results:  CBC: Recent Labs  04/23/16 0420 04/25/16 0433  WBC 14.4* 13.3*  HGB 11.3* 10.6*  HCT 40.1 38.2  PLT 676* 579*    BMET:  Recent Labs  04/24/16 0420 04/25/16 0433  NA 139 138  K 4.5 4.5  CL 90* 90*  CO2 38* 38*  GLUCOSE 164* 169*  BUN 23* 22*  CREATININE 0.87 0.79  CALCIUM 11.3* 11.0*     PT/INR:   Recent Labs  04/23/16 0703  LABPROT 14.4  INR 1.10    CBG (last 3)   Recent Labs  04/24/16 2106 04/25/16 0628 04/25/16 0912  GLUCAP 126* 231* 176*    ABG    Component Value Date/Time   PHART 7.336* 04/23/2016 0936   PCO2ART 76.1* 04/23/2016 0936   PO2ART 63.0* 04/23/2016 0936   HCO3 40.7* 04/23/2016 0936   TCO2 43 04/23/2016 0936   O2SAT 78.6 04/25/2016 0435    CXR: N/a  CARDIAC CATHETERIZATION Procedures    Right/Left Heart Cath and Coronary Angiography      Conclusion     There is hyperdynamic left ventricular systolic function.  1. Vigorous LV systolic function with severe mitral regurgitation 2. Severe pulmonary HTN with PA pressure 96/37 (mean 38mmHg), transpulmonic gradient 35 mmHg, PVR 6.6 Woods units 3. Angiographically normal coronary arteries  Recommend: continued treatment of CHF and evaluation for high-risk mitral valve surgery     Indications    Severe mitral regurgitation [I34.0 (ICD-10-CM)]    Technique and Indications    INDICATION: Severe mitral regurgitation. 60 yo woman with a complex medical history of morbid obesity, stroke, mitral valve endocarditis, and severe residual mitral regurgitation with NYHA Class IV CHF. Referred for right and left heart catheterization for preoperative assessment.   PROCEDURAL DETAILS: The right groin was prepped, draped, and anesthetized with 1% lidocaine. Using the modified Seldinger technique a 5 French sheath was placed in the right femoral artery and a 7 French sheath was placed in the right femoral vein. A Swan-Ganz catheter was used for the right heart catheterization. Standard protocol was followed for recording of right heart pressures and sampling of oxygen saturations. Fick cardiac output was calculated. Standard Judkins catheters were used for selective coronary angiography and left ventriculography. There were  no immediate procedural complications. The patient was transferred to the post catheterization recovery area for further monitoring.  During this procedure the patient is administered a total of Versed 2 mg and Fentanyl 25 mg to achieve and maintain moderate conscious sedation. [name] heart rate, blood pressure, and oxygen saturation are monitored continuously during the procedure. The period of conscious sedation is 32 minutes, of which I was present face-to-face 100% of this time. Estimated blood loss <50 mL. There were no immediate complications during the  procedure.    Coronary Findings    Dominance: Right   Left Main  . Vessel is angiographically normal.     Left Anterior Descending  . Vessel is angiographically normal.     Left Circumflex  . Vessel is angiographically normal.     Right Coronary Artery  . Vessel is angiographically normal.       Right Heart Pressures Hemodynamic findings consistent with severe pulmonary hypertension. Elevated LV EDP consistent with volume overload.    Wall Motion                 Left Heart    Left Ventricle The left ventricular size is normal. There is hyperdynamic left ventricular systolic function. The left ventricular ejection fraction is greater tha 65% by visual estimate. There are no wall motion abnormalities in the left ventricle.   Mitral Valve There is severe (4+) mitral regurgitation.    Coronary Diagrams    Diagnostic Diagram            Implants     No implant documentation for this case.    PACS Images    Show images for Cardiac catheterization     Link to Procedure Log    Procedure Log      Hemo Data       Most Recent Value   Fick Cardiac Output  5.34 L/min   Fick Cardiac Output Index  2.54 (L/min)/BSA   Aortic Mean Gradient  10.9 mmHg   Aortic Peak Gradient  11 mmHg   Aortic Valve Area  1.71   Aortic Value Area Index  0.82 cm2/BSA   RA A Wave  15 mmHg   RA V Wave  12 mmHg   RA Mean  12 mmHg   RV Systolic Pressure  93 mmHg   RV Diastolic Pressure  7 mmHg   RV EDP  16 mmHg   PA Systolic Pressure  96 mmHg   PA Diastolic Pressure  37 mmHg   PA Mean  61 mmHg   PW A Wave  25 mmHg   PW V Wave  31 mmHg   PW Mean  26 mmHg   AO Systolic Pressure  123456 mmHg   AO Diastolic Pressure  63 mmHg   AO Mean  85 mmHg   LV Systolic Pressure  [id number] mmHg   LV Diastolic Pressure  12 mmHg   LV EDP  24 mmHg   Arterial Occlusion Pressure Extended Systolic Pressure  123456 mmHg   Arterial Occlusion Pressure Extended  Diastolic Pressure  65 mmHg   Arterial Occlusion Pressure Extended Mean Pressure  86 mmHg   Left Ventricular Apex Extended Systolic Pressure  [id number] mmHg   Left Ventricular Apex Extended Diastolic Pressure  12 mmHg   Left Ventricular Apex Extended EDP Pressure  21 mmHg   QP/QS  1   TPVR Index  23.98 HRUI   TSVR Index  33.4 HRUI   PVR SVR Ratio  0.56   TPVR/TSVR  Ratio  0.72    CT ANGIOGRAPHY HEAD  TECHNIQUE: Multidetector CT imaging of the head was performed using the standard protocol during bolus administration of intravenous contrast. Multiplanar CT image reconstructions and MIPs were obtained to evaluate the vascular anatomy.  CONTRAST: 50 mL Isovue 370  COMPARISON: MRI brain 02/07/2016. CT head without contrast 02/07/2016. Go to contrast  FINDINGS: CT HEAD  Brain: There is expected evolution of multiple nonhemorrhagic infarcts involving the posterior right MCA territories. A remote infarct is noted in the posterior limb of the right internal capsule. No definite acute infarct is present. The basal ganglia are intact. The insular ribbon is normal. Previously noted subarachnoid hemorrhage has resolved. No new hemorrhage is present.  Calvarium and skull base: Within normal limits.  Paranasal sinuses: Clear  Orbits: Unremarkable.  CTA HEAD  Anterior circulation: Atherosclerotic calcifications are present within the cavernous internal carotid artery bilaterally. There is mild narrowing of the left supraclinoid internal carotid artery with luminal narrowing to 1.4 mm. This compares to 2.7 mm at the ICA terminus. The A1 and M1 segments are normal. The anterior communicating artery is patent. Mild distal small vessel disease is present without a significant proximal stenosis or occlusion.  Posterior circulation: The left vertebral artery is the dominant vessel. The PICA origins are visualized and normal bilaterally. The basilar artery is within  normal limits. Both posterior cerebral arteries originate from basilar tip. Mild distal segmental irregularity is noted in the PCA branch vessels, worse on the left. There is no significant proximal stenosis or occlusion.  Venous sinuses: The dural sinuses are patent. Straight sinus and deep cerebral veins are intact. Cortical veins are unremarkable.  Anatomic variants: None  Delayed phase:Postcontrast images demonstrate no pathologic enhancement. The remote right MCA infarcts are exaggerated.  IMPRESSION: 1. Expected evolution of multi focal nonhemorrhagic infarcts in the posterior right MCA territory. 2. Mild diffuse distal small vessel disease without a significant proximal stenosis, aneurysm, or branch vessel occlusion.   Electronically Signed  By: San Morelle M.D.  On: 04/22/2016 16:05   Assessment/Plan: S/P Procedure(s) (LRB): Right/Left Heart Cath and Coronary Angiography (N/A)  I have personally reviewed the patient's diagnostic cardiac catheterization and CT angiogram of head.  Cath reveals normal coronary arteries with severe mitral regurgitation and severe pulmonary hypertension.  Her mitral regurgitation is so bad that I think it is unlikely she will remain stable from a cardiac standpoint if she were to be discharged home or to SNF prior to mitral valve repair/replacement.  CT angiogram of the head reveals the expected evolution of her previous infarcts in the posterior right MCA distribution without any complicating features.  I favor proceeding with mitral valve repair/replacement during this hospitalization.  If she does well with surgery then I feel she would best be served by d/c to inpatient rehab service with plans to go home from there after several weeks of more intensive physical rehab.  This would obviously depend upon how she does following mitral valve repair/replacement.  Will check f/u blood cultures now that she is off antibiotics.  The  last set was obtained while she was still receiving Rocephin.  She still has mild leukocytosis of unclear etiology.  Will check f/u ECHO now that CHF has been optimized, with particular interest as to whether or not she has significant TR and/or RV dysfunction in the setting of terrible pulmonary hypertension.  The patient and her family were counseled at length regarding the indications, risks and potential benefits of mitral valve repair  or replacement.  The rationale for elective surgery has been explained, including a comparison between surgery and continued medical therapy with close follow-up.   In the event that their valve cannot be successfully repaired, we discussed the possibility of replacing the mitral valve using a mechanical prosthesis with the attendant need for long-term anticoagulation versus the alternative of replacing it using a bioprosthetic tissue valve with its potential for late structural valve deterioration and failure, depending upon the patient's longevity.  The patient specifically requests that if the mitral valve must be replaced that it be done using a bioprosthetic tissue valve.   The patient understands and accepts all potential risks of surgery including but not limited to risk of death, stroke or other neurologic complication, myocardial infarction, congestive heart failure, respiratory failure, renal failure, bleeding requiring transfusion and/or reexploration, arrhythmia, infection or other wound complications, pneumonia, pleural and/or pericardial effusion, pulmonary embolus, aortic dissection or other major vascular complication, or delayed complications related to valve repair or replacement including but not limited to structural valve deterioration and failure, thrombosis, embolization, endocarditis, or paravalvular leak.  All of their questions have been answered.  We tentatively plan for surgery on Thursday June 8 as long as no contraindications are discovered  between now and then.  I spent in excess of 45 minutes during the conduct of this hospital encounter and >50% of this time involved direct face-to-face encounter with the patient for counseling and/or coordination of their care.   Rexene Alberts, MD 04/25/2016 10:51 AM

## 2016-04-26 ENCOUNTER — Inpatient Hospital Stay (HOSPITAL_COMMUNITY): Payer: Medicaid Other

## 2016-04-26 ENCOUNTER — Other Ambulatory Visit (HOSPITAL_COMMUNITY): Payer: Self-pay

## 2016-04-26 LAB — BASIC METABOLIC PANEL
Anion gap: 7 (ref 5–15)
BUN: 20 mg/dL (ref 6–20)
CALCIUM: 10.5 mg/dL — AB (ref 8.9–10.3)
CO2: 39 mmol/L — ABNORMAL HIGH (ref 22–32)
Chloride: 92 mmol/L — ABNORMAL LOW (ref 101–111)
Creatinine, Ser: 0.72 mg/dL (ref 0.44–1.00)
GFR calc Af Amer: >60 mL/min (ref >60)
GLUCOSE: 163 mg/dL — AB (ref 65–99)
Potassium: 4.2 mmol/L (ref 3.5–5.1)
Sodium: 138 mmol/L (ref 135–145)

## 2016-04-26 LAB — CARBOXYHEMOGLOBIN
Carboxyhemoglobin: 1.6 % — ABNORMAL HIGH (ref 0.5–1.5)
Methemoglobin: 0.6 % (ref 0.0–1.5)
O2 Saturation: 74.7 %
Total hemoglobin: 11.6 g/dL — ABNORMAL LOW (ref 12.0–16.0)

## 2016-04-26 LAB — GLUCOSE, CAPILLARY
Glucose-Capillary: 224 mg/dL — ABNORMAL HIGH (ref 65–99)
Glucose-Capillary: 223 mg/dL — ABNORMAL HIGH (ref 65–99)
Glucose-Capillary: 73 mg/dL (ref 65–99)
Glucose-Capillary: 175 mg/dL — ABNORMAL HIGH (ref 65–99)

## 2016-04-26 MED ORDER — METOLAZONE 2.5 MG PO TABS
2.5000 mg | ORAL_TABLET | Freq: Once | ORAL | Status: AC
  Administered 2016-04-26: 2.5 mg via ORAL
  Filled 2016-04-26: qty 1

## 2016-04-26 NOTE — Progress Notes (Signed)
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 Not clear to me if she had orthopnea. Weight creeping back up. In/Out not recorded accurately.  CURRENT MEDS . albuterol  2.5 mg Nebulization QHS  . antiseptic oral rinse  7 mL Mouth Rinse BID  . aspirin  81 mg Oral Daily  . atenolol  25 mg Oral BID  . cholecalciferol  1,000 Units Oral BID  . clonazePAM  0.5 mg Oral BID  . enoxaparin (LOVENOX) injection  40 mg Subcutaneous Q24H  . escitalopram  10 mg Oral QHS  . feeding supplement (ENSURE ENLIVE)  237 mL Oral BID BM  . furosemide  80 mg Oral BID  . gabapentin  200 mg Oral TID  . insulin aspart  0-20 Units Subcutaneous TID WC  . insulin aspart  0-5 Units Subcutaneous QHS  . insulin aspart protamine- aspart  10 Units Subcutaneous BID WC  . linagliptin  5 mg Oral Daily  . metFORMIN  1,000 mg Oral BID WC  . polyethylene glycol  17 g Oral Daily  . pravastatin  10 mg Oral q1800  . sodium chloride flush  10-40 mL Intracatheter Q12H  . sodium chloride flush  3 mL Intravenous Q12H  . sodium chloride flush  3 mL Intravenous Q12H  . spironolactone  50 mg Oral Daily    OBJECTIVE   Intake/Output Summary (Last 24 hours) at 04/26/16 1201 Last data filed at 04/26/16 0900  Gross per 24 hour  Intake    560 ml  Output    200 ml  Net    360 ml   Filed Weights    04/24/16 0427 04/25/16 0540 04/26/16 0440  Weight: 106.958 kg (235 lb 12.8 oz) 107.366 kg (236 lb 11.2 oz) 108.228 kg (238 lb 9.6 oz)    PHYSICAL EXAM Filed Vitals:   04/25/16 1155 04/25/16 2038 04/25/16 2103 04/26/16 0440  BP: 105/55 127/55  121/64  Pulse: 64 74  72  Temp: 97.7 F (36.5 C) 97.7 F (36.5 C)  97.9 F (36.6 C)  TempSrc: Oral Oral  Oral  Resp: 18 18  18   Height:      Weight:    108.228 kg (238 lb 9.6 oz)  SpO2: 95% 98% 94% 99%   General: Alert, oriented x3, no distress Head: no evidence of trauma, PERRL, EOMI, no exophtalmos or lid lag, no myxedema, no xanthelasma; normal ears, nose and oropharynx Neck: normal jugular venous pulsations and no hepatojugular reflux; brisk carotid pulses without delay and no carotid bruits Chest: clear to auscultation, no signs of consolidation by percussion or palpation, normal fremitus, symmetrical and full respiratory excursions Cardiovascular: normal position and quality of the apical impulse, regular rhythm, normal first and second heart sounds, no rubs or gallops, [id number] holosystolic murmur, no diastolic murmur Abdomen: no tenderness or distention, no masses by palpation, no abnormal pulsatility or arterial bruits,  normal bowel sounds, no hepatosplenomegaly Extremities: no clubbing, cyanosis or edema, but has brawny chronic trophic changes of the pretibial skin bilaterally; 2+ radial, ulnar and brachial pulses bilaterally; 2+ right femoral, posterior tibial and dorsalis pedis pulses; 2+ left femoral, posterior tibial and dorsalis pedis pulses; no subclavian or femoral bruits Neurological: left leg paresis  LABS  CBC  Recent Labs  04/25/16 0433  WBC 13.3*  HGB 10.6*  HCT 38.2  MCV 88.8  PLT 123456*   Basic Metabolic Panel  Recent Labs  04/23/16 2015  04/25/16 0433 04/26/16 0540  NA 134*  < > 138 138  K 4.3  < > 4.5 4.2  CL 88*  < > 90* 92*  CO2 36*  < > 38* 39*  GLUCOSE 178*  < > 169* 163*  BUN 23*  < > 22* 20   CREATININE 0.91  < > 0.79 0.72  CALCIUM 11.3*  < > 11.0* 10.5*  MG 2.0  --   --   --   < > = values in this interval not displayed.  Radiology Studies Imaging results have been reviewed and No results found.  TELE NSR    ASSESSMENT AND PLAN  1. Acute on chronic diastolic heart failure: She has diuresed well (>10L since admission), but in/out not appropriately recorded last couple of days. Still with improving renal function. Will continue PO Lasix today, add a one time metolazone dose. Continue Beta blocker and Aldactone.   2. 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 Later came back with embolic CVA, confirmed mitral valve vegetation. Cath with normal coronary arteries. Elevated filling pressures. Severe elevation PA pressures, but with trans pulmonary gradient fairly low. Abx stopped. Blood cultures from 5/24 final result is negative.  Plan for MVR bioprosthesis on Thursday.  3. 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 Essential hypertension: BP stable.   5. Hyperlipidemia: on Pravastatin   6. Type 2 DM: on metformin, linagliptin, and SSI  7. Anxiety: seems compensated on benzodiazepines   Sanda Klein, MD, [facility] [phone] office [phone] pager 04/26/2016 12:01 PM

## 2016-04-26 NOTE — Progress Notes (Signed)
Pt called the desk and said she was short of breath and when I went in the room she said "I'm not short of breath I just needed you to come in here."

## 2016-04-27 ENCOUNTER — Inpatient Hospital Stay (HOSPITAL_COMMUNITY): Payer: Medicaid Other

## 2016-04-27 ENCOUNTER — Other Ambulatory Visit (HOSPITAL_COMMUNITY): Payer: Self-pay

## 2016-04-27 DIAGNOSIS — I360 Nonrheumatic tricuspid (valve) stenosis: Secondary | ICD-10-CM

## 2016-04-27 DIAGNOSIS — I340 Nonrheumatic mitral (valve) insufficiency: Secondary | ICD-10-CM

## 2016-04-27 LAB — CARBOXYHEMOGLOBIN
Carboxyhemoglobin: 1.2 % (ref 0.5–1.5)
Methemoglobin: 0.7 % (ref 0.0–1.5)
O2 Saturation: 83.3 %
Total hemoglobin: 11.4 g/dL — ABNORMAL LOW (ref 12.0–16.0)

## 2016-04-27 LAB — ECHOCARDIOGRAM COMPLETE
Height: 65 in
Weight: 3745.6 oz

## 2016-04-27 LAB — GLUCOSE, CAPILLARY
Glucose-Capillary: 129 mg/dL — ABNORMAL HIGH (ref 65–99)
Glucose-Capillary: 161 mg/dL — ABNORMAL HIGH (ref 65–99)
Glucose-Capillary: 141 mg/dL — ABNORMAL HIGH (ref 65–99)
Glucose-Capillary: 114 mg/dL — ABNORMAL HIGH (ref 65–99)

## 2016-04-27 NOTE — Progress Notes (Signed)
  Echocardiogram 2D Echocardiogram has been performed.  Margaret [name] 04/27/2016, 10:31 AM

## 2016-04-27 NOTE — Progress Notes (Signed)
No changes from ID standpoint.  Blood cultures repeated 6/3 and remain negative to date.  Plan for surgery on 6/8 pending blood cultures and any other issues.  Will continue to follow.  Scharlene Gloss, MD

## 2016-04-27 NOTE — Progress Notes (Signed)
Pre-op Cardiac Surgery  Carotid Findings:  1-39% right ICA stenosis.  40-59% left ICA stenosis.  Vertebral artery flow is antegrade.   Upper Extremity Right Left  Brachial Pressures 121T 114T  Radial Waveforms T T  Ulnar Waveforms T T  Palmar Arch (Allen's Test) WNL Doppler signal obliterates with radial compression and remains normal with ulnar compression   Findings:      Lower  Extremity Right Left  Dorsalis Pedis    Anterior Tibial    Posterior Tibial    Ankle/Brachial Indices      Findings:

## 2016-04-27 NOTE — Progress Notes (Signed)
Physical Therapy Treatment Patient Details Name: Margaret [name] MRN: [id number] DOB: 01/04/1956 Today's Date: 04/27/2016    History of Present Illness Margaret [name] is a 60 y.o. female admitted with acute  CHF exacerbation.  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 Working up for ?MVR    PT Comments    Noted plans for surgery 6/8. Patient highly motivated and attending to left UE much better today. Left LE with incr movement (see below in exercises for details).   Follow Up Recommendations  Other (comment) (to be determined post-surgery 6/8)     Equipment Recommendations   (TBA post-surgery)    Recommendations for Other Services       Precautions / Restrictions Precautions Precautions: Fall Precaution Comments: L hemiparesis    Mobility  Bed Mobility Overal bed mobility: Needs Assistance Bed Mobility: Supine to Sit Rolling:  (to Rt with RUE on rail)   Supine to sit: HOB elevated;Mod assist (from [facility] elevated, pivot to Rt EOB; assist with pad)        Transfers Overall transfer level: Needs assistance   Transfers: Sit to/from Stand;Stand Pivot Transfers Sit to Stand: Min assist;From elevated surface Stand pivot transfers:  (pivot via stedy)       General transfer comment: stood x 4 with varying surface height (all elevated); support provided to LUE to protect  Ambulation/Gait             General Gait Details: unable; performed standing weight shifts Lt, Rt and bending/straightening Lt knee without buckling or hyperextending   Stairs            Wheelchair Mobility    Modified Rankin (Stroke Patients Only) Modified Rankin (Stroke Patients Only) Pre-Morbid Rankin Score: Severe disability Modified Rankin: Severe  disability     Balance     Sitting balance-Leahy Scale: Good Sitting balance - Comments: reaching forward and rt outside her BOS   Standing balance support: Single extremity supported Standing balance-Leahy Scale: Poor Standing balance comment: RUE on stedy; less left lean and able to correct as she began to drift left                    Cognition Arousal/Alertness: Awake/alert Behavior During Therapy: WFL for tasks assessed/performed Overall Cognitive Status: History of cognitive impairments - at baseline                      Exercises General Exercises - Lower Extremity Ankle Circles/Pumps: PROM;Left Long Arc Quad: AAROM;Limitations Long Arc Quad Limitations: able to assist with eccentric lowering leg Other Exercises Other Exercises: much better attending to left arm "lefty"; required cues x 2 only     General Comments General comments (skin integrity, edema, etc.): Patient reports good success with nursing and use of stedy. Reports she was OOB in chair and on BSC both Sat/Sun      Pertinent Vitals/Pain Pain Assessment: Faces Faces Pain Scale: Hurts little more Pain Location: Lt shoulder with certain movements Pain Descriptors / Indicators: Discomfort Pain Intervention(s): Limited activity within patient's tolerance;Monitored during session;Repositioned;Other (comment) (supported LUE )    Home Living                      Prior Function  PT Goals (current goals can now be found in the care plan section) Acute Rehab PT Goals Patient Stated Goal: to regain independence  Time For Goal Achievement: 05/06/16 Progress towards PT goals: Progressing toward goals    Frequency  Min 3X/week    PT Plan Discharge plan needs to be updated;Frequency needs to be updated    Co-evaluation             End of Session Equipment Utilized During Treatment: Gait belt Activity Tolerance: Patient tolerated treatment well Patient left: with  call bell/phone within reach;in chair     Time: 4098-1191 PT Time Calculation (min) (ACUTE ONLY): 46 min  Charges:  $Therapeutic Activity: 23-37 mins $Neuromuscular Re-education: 8-22 mins                    G Codes:      Anihya Tuma May 12, 2016, 6:55 PM Pager [phone]

## 2016-04-27 NOTE — Progress Notes (Signed)
Patient Name: Margaret [name] Date of Encounter: 04/27/2016  Primary Cardiologist: Dr. Meda Coffee  Patient profile: Margaret [name] is a 60 y.o. female with a hx of COPD, HFpEF (seen by Dr. 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     Principal Problem:   Acute on chronic diastolic congestive heart failure due to valvular disease ([place]) Active Problems:   Essential hypertension   Endocarditis of mitral valve   Mitral regurgitation   HLD (hyperlipidemia)   Type 2 diabetes mellitus with circulatory disorder (HCC)   History of stroke   CHF due to valvular disease, acute on chronic, diastolic   Other depression due to general medical condition   Panic disorder without agoraphobia with severe panic attacks    SUBJECTIVE  Denies any CP or SOB. Much more alert than when I saw her last time a week ago.  CURRENT MEDS . albuterol  2.5 mg Nebulization QHS  . antiseptic oral rinse  7 mL Mouth Rinse BID  . aspirin  81 mg Oral Daily  . atenolol  25 mg Oral BID  . cholecalciferol  1,000 Units Oral BID  . clonazePAM  0.5 mg Oral BID  . enoxaparin (LOVENOX) injection  40 mg Subcutaneous Q24H  . escitalopram  10 mg Oral QHS  . feeding supplement (ENSURE ENLIVE)  237 mL Oral BID BM  . furosemide  80 mg Oral BID  . gabapentin  200 mg Oral TID  . insulin aspart  0-20 Units Subcutaneous TID WC  . insulin aspart  0-5 Units Subcutaneous QHS  . insulin aspart protamine- aspart  10 Units Subcutaneous BID WC  . linagliptin  5 mg Oral Daily  . metFORMIN  1,000 mg Oral BID WC  . polyethylene glycol  17 g Oral Daily  . pravastatin  10 mg Oral q1800  . sodium chloride flush  10-40 mL Intracatheter Q12H  . sodium  chloride flush  3 mL Intravenous Q12H  . sodium chloride flush  3 mL Intravenous Q12H  . spironolactone  50 mg Oral Daily    OBJECTIVE  Filed Vitals:   04/26/16 2050 04/27/16 0427 04/27/16 0431 04/27/16 1027  BP:  83/47 97/44 102/45  Pulse: 72 61  64  Temp:  97.7 F (36.5 C)  97.7 F (36.5 C)  TempSrc:  Oral  Oral  Resp: 18 19    Height:      Weight:  234 lb 1.6 oz (106.187 kg)    SpO2:  100% 100% 100%    Intake/Output Summary (Last 24 hours) at 04/27/16 1045 Last data filed at 04/27/16 1033  Gross per 24 hour  Intake    720 ml  Output   1550 ml  Net   -830 ml   Filed Weights   04/25/16 0540 04/26/16 0440 04/27/16 0427  Weight: 236 lb 11.2 oz (107.366 kg) 238 lb 9.6 oz (108.228 kg) 234 lb 1.6 oz (106.187 kg)    PHYSICAL EXAM  General: Pleasant, NAD. Neuro: Alert and oriented X 3. Moves all extremities spontaneously. Psych: Normal affect. HEENT:  Normal  Neck: Supple without bruits or JVD. Lungs:  Resp regular and unlabored. Largely CTA, decreased basilar breath sound in R base, consistent with pleural effusion. Heart: RRR no s3, s4,  or murmurs. Abdomen: Soft, non-tender, non-distended, BS + x 4.  Extremities: No clubbing, cyanosis or edema. DP/PT/Radials 2+ and equal bilaterally.  Accessory Clinical Findings  CBC  Recent Labs  04/25/16 0433  WBC 13.3*  HGB 10.6*  HCT 38.2  MCV 88.8  PLT 123456*   Basic Metabolic Panel  Recent Labs  04/25/16 0433 04/26/16 0540  NA 138 138  K 4.5 4.2  CL 90* 92*  CO2 38* 39*  GLUCOSE 169* 163*  BUN 22* 20  CREATININE 0.79 0.72  CALCIUM 11.0* 10.5*    TELE NSR    ECG  No new EKG  Echocardiogram 03/31/2016  LV EF: 60% - 65%  ------------------------------------------------------------------- Indications: MVD [non-rheumatic] 424.0.  ------------------------------------------------------------------- History: PMH: Panic attacks. Murmur.Bacterial endocarditis. Pulmonary hypertension.  Subarachnoid bleed. Congestive heart failure. Stroke. Chronic obstructive pulmonary disease. Risk factors: Hypertension. Diabetes mellitus. Dyslipidemia.  ------------------------------------------------------------------- Study Conclusions  - Left ventricle: The cavity size was normal. There was moderate  concentric hypertrophy. Systolic function was normal. The  estimated ejection fraction was in the range of 60% to 65%. Wall  motion was normal; there were no regional wall motion  abnormalities. Features are consistent with a pseudonormal left  ventricular filling pattern, with concomitant abnormal relaxation  and increased filling pressure (grade 2 diastolic dysfunction).  Doppler parameters are consistent with elevated ventricular  end-diastolic filling pressure. - Aortic valve: Trileaflet; normal thickness leaflets. There was no  regurgitation. - Aortic root: The aortic root was normal in size. - Mitral valve: Moderately thickened, moderately calcified leaflets  . There was severe regurgitation directed centrally and  anteriorly. - Left atrium: The atrium was severely dilated. - Right ventricle: The cavity size was normal. Wall thickness was  normal. Systolic function was normal. - Right atrium: The atrium was mildly dilated. - Pulmonic valve: There was mild regurgitation. - Pulmonary arteries: Systolic pressure was within the normal  range. - Inferior vena cava: The vessel was normal in size.  Impressions:  - There is a highly mobile echodensity attached to the posterior  mitral valve leaflet measuring 24 mm in its longest diameter.  This is most probably consistent with a vegetation.  When compared to the prior study from 02/11/2016 this might be  slightly increased in size.  Mitral regurgitation is severe.  Normal RVSP.    Radiology/Studies  Ct Angio Head W/cm &/or Wo Cm  04/22/2016  CLINICAL DATA:  Subarachnoid hemorrhage and CVA.  Cerebral septic emboli. EXAM: CT ANGIOGRAPHY HEAD TECHNIQUE: Multidetector CT imaging of the head was performed using the standard protocol during bolus administration of intravenous contrast. Multiplanar CT image reconstructions and MIPs were obtained to evaluate the vascular anatomy. CONTRAST:  50 mL Isovue 370 COMPARISON:  MRI brain 02/07/2016. CT head without contrast 02/07/2016. Go to contrast FINDINGS: CT HEAD Brain: There is expected evolution of multiple nonhemorrhagic infarcts involving the posterior right MCA territories. A remote infarct is noted in the posterior limb of the right internal capsule. No definite acute infarct is present. The basal ganglia are intact. The insular ribbon is normal. Previously noted subarachnoid hemorrhage has resolved. No new hemorrhage is present. Calvarium and skull base: Within normal limits. Paranasal sinuses: Clear Orbits: Unremarkable. CTA HEAD Anterior circulation: Atherosclerotic calcifications are present within the cavernous internal carotid artery bilaterally. There is mild narrowing of the left supraclinoid internal carotid artery with luminal narrowing to 1.4 mm. This compares to 2.7 mm at the ICA terminus. The A1 and M1 segments are normal. The anterior communicating artery is  patent. Mild distal small vessel disease is present without a significant proximal stenosis or occlusion. Posterior circulation: The left vertebral artery is the dominant vessel. The PICA origins are visualized and normal bilaterally. The basilar artery is within normal limits. Both posterior cerebral arteries originate from basilar tip. Mild distal segmental irregularity is noted in the PCA branch vessels, worse on the left. There is no significant proximal stenosis or occlusion. Venous sinuses: The dural sinuses are patent. Straight sinus and deep cerebral veins are intact. Cortical veins are unremarkable. Anatomic variants: None Delayed phase:Postcontrast images demonstrate no  pathologic enhancement. The remote right MCA infarcts are exaggerated. IMPRESSION: 1. Expected evolution of multi focal nonhemorrhagic infarcts in the posterior right MCA territory. 2. Mild diffuse distal small vessel disease without a significant proximal stenosis, aneurysm, or branch vessel occlusion. Electronically Signed   By: San Morelle M.D.   On: 04/22/2016 16:05   Dg Orthopantogram  04/15/2016  CLINICAL DATA:  60 year old female with cardiac valvular disease and CHF. Poor dentition. Initial encounter. EXAM: ORTHOPANTOGRAM/PANORAMIC COMPARISON:  CTA neck 02/01/2016 FINDINGS: Due to motion artifact on this exam despite repeated attempts detail of the central mandibular dentition is inferior to that on 02/01/2016. Maxillary dentition is absent. No definite dental caries or periapical dental lucency is identified. The bilateral mandibular molars are absent. IMPRESSION: Due to motion and difficulty with this exam, dental detail is better on the 02/01/2016 Neck CTA. Maxillary dentition is absent. No definite acute mandible dental inflammation. Electronically Signed   By: Genevie Ann M.D.   On: 04/15/2016 07:41   Dg Chest 2 View  04/26/2016  CLINICAL DATA:  CHF EXAM: CHEST  2 VIEW COMPARISON:  04/15/2016 FINDINGS: Right central line remains in place, unchanged. Cardiomegaly with vascular congestion. Small bilateral pleural effusions. Bilateral airspace disease likely reflects edema/ CHF. Slight improvement since prior study. IMPRESSION: Slight improvement in the pulmonary edema/ CHF pattern. Small bilateral effusions. Electronically Signed   By: Rolm Baptise M.D.   On: 04/26/2016 15:13   Dg Chest 2 View  04/15/2016  CLINICAL DATA:  CHF due to valvular disease. EXAM: CHEST  2 VIEW COMPARISON:  04/14/2016 FINDINGS: Mild progression of diffuse bilateral airspace disease consistent with edema. Right lower lobe volume loss and right effusion unchanged. No significant effusion on the left. Right jugular  central venous catheter tip in the SVC unchanged. IMPRESSION: Diffuse bilateral pulmonary edema with mild progression Right lower lobe volume loss and small right effusion unchanged. Electronically Signed   By: Franchot Gallo M.D.   On: 04/15/2016 07:38   Dg Chest Port 1 View  04/14/2016  CLINICAL DATA:  Post PICC line placement. EXAM: PORTABLE CHEST 1 VIEW COMPARISON:  02/12/2016 FINDINGS: Examination demonstrates interval placement of right central venous catheter with tip over the SVC. No evidence of pneumothorax. The Lungs are hypoinflated with moderate perihilar bibasilar opacification likely interstitial edema, although cannot exclude infection. Likely small right pleural effusion. Stable cardiomegaly. There is calcified plaque over the aortic arch. Remainder of the exam is unchanged. IMPRESSION: Moderate perihilar and bibasilar opacification which may be due to interstitial edema versus infection. Small right pleural effusion. Stable cardiomegaly. Right IJ central venous catheter with tip over the SVC. No pneumothorax. Electronically Signed   By: Marin Olp M.D.   On: 04/14/2016 22:02    ASSESSMENT AND PLAN  1. Acute on chronic diastolic HF - massively fluid overloaded on arrival. I/O -10L. Weight down from 260 to 234 lbs. Continue 80mg  BID PO lasix. Decreased breath  sound in R base consistent with pleural effusion, however, she is not symptomatic. Will observe for now.   2. Severe mitral regurg with infective endocarditis - initially admitted in 01/2016 with splenic infarct, placed on Xarelto. Later came back with embolic CVA, confirmed mitral valve vegetation. Seen by Dr. Roxy Manns - felt to be a poor candidate for surgery in her current state. Neurology recommend delay surgery until at least Sept (6 months following CVA/SAH) - last echo 03/31/2016 normal EF, severe MR with highly mobile echodensity attached to the posterior mitral valve leaflet measuring 58mm in  diameter, when compare to prior study from 3/21, there might be slight increase in size. Followed by ID, felt her worsening valve dx is likely sequelae of infection rather than ongoing infection. Recommended stop IV abx  - L&[facility]HC 04/23/2016 severe MR, severe pulm HTN with PA pressure 96/27, transpulmonic gradient 8mmHg, PVR 6.6 woods units, normal coronaries.  - seen by Dr. Roxy Manns, plan to proceed with high risk mitral valve repair/replacement 6/8. Pending repeat echo today, per Dr. Roxy Manns, particular interest as to whether or not she has significant TR and or RV dysfunction given pulm HTN  3. H/o septic embolic CVA and splenic embolic infarct  - CTA of head, expected evolution of multi focal nonhemorrhagic infarcts in the posterior R MCA territory.   4. HTN  5. HLD  6. DM II  7. Anxiety: consulted psychiatry on 5/25 who started her on 0.5mg  BID klonipin with 0.25mg  TID PRN klonipin and lexapro and gabapentin.  Signed, Almyra Deforest PA-C Pager: [phone]   I have examined the patient and reviewed assessment and plan and discussed with patient.  Agree with above as stated.  Await echo today.  BP borderline low at times. Plan for surgery on THursday.  Larae Grooms

## 2016-04-28 LAB — CARBOXYHEMOGLOBIN
Carboxyhemoglobin: 1.6 % — ABNORMAL HIGH (ref 0.5–1.5)
Methemoglobin: 0.8 % (ref 0.0–1.5)
O2 Saturation: 99.6 %
Total hemoglobin: 11.3 g/dL — ABNORMAL LOW (ref 12.0–16.0)

## 2016-04-28 LAB — GLUCOSE, CAPILLARY
Glucose-Capillary: 212 mg/dL — ABNORMAL HIGH (ref 65–99)
Glucose-Capillary: 104 mg/dL — ABNORMAL HIGH (ref 65–99)
Glucose-Capillary: 169 mg/dL — ABNORMAL HIGH (ref 65–99)
Glucose-Capillary: 100 mg/dL — ABNORMAL HIGH (ref 65–99)

## 2016-04-28 LAB — CREATININE, SERUM
Creatinine, Ser: 0.97 mg/dL (ref 0.44–1.00)
GFR calc Af Amer: >60 mL/min (ref >60)
GFR calc non Af Amer: >60 mL/min (ref >60)

## 2016-04-28 MED ORDER — ENOXAPARIN SODIUM 40 MG/0.4ML ~~LOC~~ SOLN
40.0000 mg | SUBCUTANEOUS | Status: DC
  Administered 2016-04-28 – 2016-04-29 (×2): 40 mg via SUBCUTANEOUS
  Filled 2016-04-28 (×2): qty 0.4

## 2016-04-28 NOTE — Progress Notes (Signed)
Patient Name: Margaret [name] Date of Encounter: 04/28/2016  Primary Cardiologist: Dr. Meda Coffee  Patient profile: Margaret [name] is a 60 y.o. female with a hx of COPD, HFpEF (seen by Dr. 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     Principal Problem:   Acute on chronic diastolic congestive heart failure due to valvular disease ([place]) Active Problems:   Essential hypertension   Endocarditis of mitral valve   Mitral regurgitation   HLD (hyperlipidemia)   Type 2 diabetes mellitus with circulatory disorder (HCC)   History of stroke   CHF due to valvular disease, acute on chronic, diastolic   Other depression due to general medical condition   Panic disorder without agoraphobia with severe panic attacks    SUBJECTIVE  Denies any CP or SOB. Sleeping when i walked in, but did wake up and following command. BP low yesterday, improved.   CURRENT MEDS . albuterol  2.5 mg Nebulization QHS  . antiseptic oral rinse  7 mL Mouth Rinse BID  . aspirin  81 mg Oral Daily  . atenolol  25 mg Oral BID  . cholecalciferol  1,000 Units Oral BID  . clonazePAM  0.5 mg Oral BID  . enoxaparin (LOVENOX) injection  40 mg Subcutaneous Q24H  . escitalopram  10 mg Oral QHS  . feeding supplement (ENSURE ENLIVE)  237 mL Oral BID BM  . furosemide  80 mg Oral BID  . gabapentin  200 mg Oral TID  . insulin aspart  0-20 Units Subcutaneous TID WC  . insulin aspart  0-5 Units Subcutaneous QHS  . insulin aspart protamine- aspart  10 Units Subcutaneous BID WC  . linagliptin  5 mg Oral Daily  . metFORMIN  1,000 mg Oral BID WC  . polyethylene glycol  17 g Oral Daily  . pravastatin  10 mg Oral q1800  . sodium chloride flush   10-40 mL Intracatheter Q12H  . sodium chloride flush  3 mL Intravenous Q12H  . sodium chloride flush  3 mL Intravenous Q12H  . spironolactone  50 mg Oral Daily    OBJECTIVE  Filed Vitals:   04/27/16 1312 04/27/16 2230 04/28/16 0420 04/28/16 1225  BP: 76/41 118/64 105/42 118/79  Pulse:  79 77 67  Temp:  99 F (37.2 C) 99.4 F (37.4 C) 98.5 F (36.9 C)  TempSrc:  Oral Oral Oral  Resp:  16 18 20   Height:      Weight:   241 lb 11.2 oz (109.634 kg)   SpO2:  100% 97% 99%    Intake/Output Summary (Last 24 hours) at 04/28/16 1427 Last data filed at 04/28/16 1232  Gross per 24 hour  Intake   1164 ml  Output   1702 ml  Net   -538 ml   Filed Weights   04/26/16 0440 04/27/16 0427 04/28/16 0420  Weight: 238 lb 9.6 oz (108.228 kg) 234 lb 1.6 oz (106.187 kg) 241 lb 11.2 oz (109.634 kg)    PHYSICAL EXAM  General: Pleasant, NAD. Neuro: Alert and oriented X 3. Moves all extremities spontaneously. Psych: Normal affect. HEENT:  Normal  Neck: Supple without bruits or JVD. Lungs:  Resp regular and unlabored. Largely CTA, decreased basilar breath sound in R base, consistent with pleural  effusion. Heart: RRR no s3, s4, or murmurs. Abdomen: Soft, non-tender, non-distended, BS + x 4.  Extremities: No clubbing, cyanosis or edema. DP/PT/Radials 2+ and equal bilaterally.  Accessory Clinical Findings  CBC No results for input(s): WBC, NEUTROABS, HGB, HCT, MCV, PLT in the last 72 hours. Basic Metabolic Panel  Recent Labs  04/26/16 0540 04/28/16 0405  NA 138  --   K 4.2  --   CL 92*  --   CO2 39*  --   GLUCOSE 163*  --   BUN 20  --   CREATININE 0.72 0.97  CALCIUM 10.5*  --     TELE NSR    ECG  No new EKG  Echocardiogram 04/27/2016  LV EF: 60% - 65%  ------------------------------------------------------------------- Indications: Tricuspid insufficiency 424.2.  ------------------------------------------------------------------- History: PMH: Murmur.  Congestive heart failure. Stroke. Chronic obstructive pulmonary disease. Risk factors: Hypertension. Diabetes mellitus. Dyslipidemia.  ------------------------------------------------------------------- Study Conclusions  - Left ventricle: The cavity size was normal. There was moderate  concentric hypertrophy. Systolic function was normal. The  estimated ejection fraction was in the range of 60% to 65%. Wall  motion was normal; there were no regional wall motion  abnormalities. Features are consistent with a pseudonormal left  ventricular filling pattern, with concomitant abnormal relaxation  and increased filling pressure (grade 2 diastolic dysfunction).  Doppler parameters are consistent with elevated ventricular  end-diastolic filling pressure. - Mitral valve: A highly mobile echodensity is attached to the  posterior leaflet of the mitral valve measuring 19 x 9 mm. The  echodensity flops between LA and LV during systole/diastole.  Mitral valve leaflets are thickened predominantly the posterior.  There is severe mitral regurgitation with anteriorly directed  jet. The findings are consistent with mild stenosis. There was  severe regurgitation. - Left atrium: The atrium was moderately dilated. - Right ventricle: Systolic function was normal. - Right atrium: The atrium was normal in size. - Tricuspid valve: There was mild regurgitation. - Pulmonic valve: There was mild regurgitation. - Pulmonary arteries: Systolic pressure was mildly increased. PA  peak pressure: 40 mm Hg (S). - Pericardium, extracardiac: There was no pericardial effusion.  Impressions:  - LVEF is 60-65%. Grade 2 diastolic dysfunction.  Filling pressures are only mildly elevated.  A vegetation on the mitral valve appears smaller when compared to  the prior study from 03/31/2016.  RVEF is normal.  RVSP = 78mmHg.    Radiology/Studies  Ct Angio Head W/cm &/or Wo Cm  04/22/2016   CLINICAL DATA:  Subarachnoid hemorrhage and CVA. Cerebral septic emboli. EXAM: CT ANGIOGRAPHY HEAD TECHNIQUE: Multidetector CT imaging of the head was performed using the standard protocol during bolus administration of intravenous contrast. Multiplanar CT image reconstructions and MIPs were obtained to evaluate the vascular anatomy. CONTRAST:  50 mL Isovue 370 COMPARISON:  MRI brain 02/07/2016. CT head without contrast 02/07/2016. Go to contrast FINDINGS: CT HEAD Brain: There is expected evolution of multiple nonhemorrhagic infarcts involving the posterior right MCA territories. A remote infarct is noted in the posterior limb of the right internal capsule. No definite acute infarct is present. The basal ganglia are intact. The insular ribbon is normal. Previously noted subarachnoid hemorrhage has resolved. No new hemorrhage is present. Calvarium and skull base: Within normal limits. Paranasal sinuses: Clear Orbits: Unremarkable. CTA HEAD Anterior circulation: Atherosclerotic calcifications are present within the cavernous internal carotid artery bilaterally. There is mild narrowing of the left supraclinoid internal carotid artery with luminal narrowing to 1.4 mm. This compares to 2.7 mm at  the ICA terminus. The A1 and M1 segments are normal. The anterior communicating artery is patent. Mild distal small vessel disease is present without a significant proximal stenosis or occlusion. Posterior circulation: The left vertebral artery is the dominant vessel. The PICA origins are visualized and normal bilaterally. The basilar artery is within normal limits. Both posterior cerebral arteries originate from basilar tip. Mild distal segmental irregularity is noted in the PCA branch vessels, worse on the left. There is no significant proximal stenosis or occlusion. Venous sinuses: The dural sinuses are patent. Straight sinus and deep cerebral veins are intact. Cortical veins are unremarkable. Anatomic variants: None Delayed  phase:Postcontrast images demonstrate no pathologic enhancement. The remote right MCA infarcts are exaggerated. IMPRESSION: 1. Expected evolution of multi focal nonhemorrhagic infarcts in the posterior right MCA territory. 2. Mild diffuse distal small vessel disease without a significant proximal stenosis, aneurysm, or branch vessel occlusion. Electronically Signed   By: San Morelle M.D.   On: 04/22/2016 16:05   Dg Orthopantogram  04/15/2016  CLINICAL DATA:  60 year old female with cardiac valvular disease and CHF. Poor dentition. Initial encounter. EXAM: ORTHOPANTOGRAM/PANORAMIC COMPARISON:  CTA neck 02/01/2016 FINDINGS: Due to motion artifact on this exam despite repeated attempts detail of the central mandibular dentition is inferior to that on 02/01/2016. Maxillary dentition is absent. No definite dental caries or periapical dental lucency is identified. The bilateral mandibular molars are absent. IMPRESSION: Due to motion and difficulty with this exam, dental detail is better on the 02/01/2016 Neck CTA. Maxillary dentition is absent. No definite acute mandible dental inflammation. Electronically Signed   By: Genevie Ann M.D.   On: 04/15/2016 07:41   Dg Chest 2 View  04/26/2016  CLINICAL DATA:  CHF EXAM: CHEST  2 VIEW COMPARISON:  04/15/2016 FINDINGS: Right central line remains in place, unchanged. Cardiomegaly with vascular congestion. Small bilateral pleural effusions. Bilateral airspace disease likely reflects edema/ CHF. Slight improvement since prior study. IMPRESSION: Slight improvement in the pulmonary edema/ CHF pattern. Small bilateral effusions. Electronically Signed   By: Rolm Baptise M.D.   On: 04/26/2016 15:13   Dg Chest 2 View  04/15/2016  CLINICAL DATA:  CHF due to valvular disease. EXAM: CHEST  2 VIEW COMPARISON:  04/14/2016 FINDINGS: Mild progression of diffuse bilateral airspace disease consistent with edema. Right lower lobe volume loss and right effusion unchanged. No significant  effusion on the left. Right jugular central venous catheter tip in the SVC unchanged. IMPRESSION: Diffuse bilateral pulmonary edema with mild progression Right lower lobe volume loss and small right effusion unchanged. Electronically Signed   By: Franchot Gallo M.D.   On: 04/15/2016 07:38   Dg Chest Port 1 View  04/14/2016  CLINICAL DATA:  Post PICC line placement. EXAM: PORTABLE CHEST 1 VIEW COMPARISON:  02/12/2016 FINDINGS: Examination demonstrates interval placement of right central venous catheter with tip over the SVC. No evidence of pneumothorax. The Lungs are hypoinflated with moderate perihilar bibasilar opacification likely interstitial edema, although cannot exclude infection. Likely small right pleural effusion. Stable cardiomegaly. There is calcified plaque over the aortic arch. Remainder of the exam is unchanged. IMPRESSION: Moderate perihilar and bibasilar opacification which may be due to interstitial edema versus infection. Small right pleural effusion. Stable cardiomegaly. Right IJ central venous catheter with tip over the SVC. No pneumothorax. Electronically Signed   By: Marin Olp M.D.   On: 04/14/2016 22:02    ASSESSMENT AND PLAN  1. Acute on chronic diastolic HF - massively fluid overloaded on arrival. I/O -  10L. Weight down from 260 to 241 lbs. Continue 80mg  BID PO lasix. Decreased breath sound in R base consistent with pleural effusion, however, she is not symptomatic. Will observe for now. She is not more SOB than yesterday, lung exam still shows decreased breath sound in R base.   2. Severe mitral regurg with infective endocarditis - initially admitted in 01/2016 with splenic infarct, placed on Xarelto. Later came back with embolic CVA, confirmed mitral valve vegetation. Seen by Dr. Roxy Manns - felt to be a poor candidate for surgery in her current state. Neurology recommend delay surgery until at least Sept (6 months following CVA/SAH) - last  echo 03/31/2016 normal EF, severe MR with highly mobile echodensity attached to the posterior mitral valve leaflet measuring 50mm in diameter, when compare to prior study from 3/21, there might be slight increase in size. Followed by ID, felt her worsening valve dx is likely sequelae of infection rather than ongoing infection. Recommended stop IV abx  - L&RHC 04/23/2016 severe MR, severe pulm HTN with PA pressure 96/27, transpulmonic gradient 38mmHg, PVR 6.6 woods units, normal coronaries.  - seen by Dr. Roxy Manns, plan to proceed with high risk mitral valve repair/replacement 6/8. Repeat echo obtained on 04/27/2016 EF 60-65%, grade 2 DD, highly mobile echodensity attached to posterior leaflet of mitral valve measuring 19x9 mm, severe MR with mild MS, mild TR, PA peak pressure 64mmHg. ID following   3. H/o septic embolic CVA and splenic embolic infarct  - CTA of head, expected evolution of multi focal nonhemorrhagic infarcts in the posterior R MCA territory.   4. HTN  5. HLD  6. DM II  7. Anxiety: consulted psychiatry on 5/25 who started her on 0.5mg  BID klonipin with 0.25mg  TID PRN klonipin and lexapro and gabapentin.  Signed, Almyra Deforest PA-C Pager: [phone]  I have examined the patient and reviewed assessment and plan and discussed with patient.  Agree with above as stated.  Able to lie flat.  Echo as above.  Completed course of antibiotics.  Surgery planned on Thursday.  Larae Grooms

## 2016-04-28 NOTE — Progress Notes (Signed)
Occupational Therapy Treatment Patient Details Name: Margaret [name] MRN: [id number] DOB: August 25, 1956 Today's Date: 04/28/2016    History of present illness Margaret [name] is a 60 y.o. female admitted with acute  CHF exacerbation.  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 Working up for ?MVR   OT comments  Pt participating in ADL while sitting EOB. Pt complains of LUE pain. May assess with use of kinesiotape for L shoulder to reduce pain with subluxation. Also recommend use of resting hand splint to improve resting position and reduce  potential contracture formation.  Pt very appreciative. Will continue to follow to address goals.   Follow Up Recommendations  SNF    Equipment Recommendations  None recommended by OT    Recommendations for Other Services      Precautions / Restrictions Precautions Precautions: Fall Precaution Comments: L hemiparesis and neglect       Mobility Bed Mobility Overal bed mobility: Needs Assistance Bed Mobility: Supine to Sit     Supine to sit: HOB elevated;Mod assist Sit to supine: Mod assist;HOB elevated   General bed mobility comments: looping RLE under LLE to assist with lifting  Transfers                      Balance     Sitting balance-Leahy Scale: Good Sitting balance - Comments: affected by attneiotn. Note posterio r and L lateral lean when ditracted                           ADL Overall ADL's : Needs assistance/impaired Eating/Feeding: Set up;Sitting Eating/Feeding Details (indicate cue type and reason): wants to be able to do more of her set up . also complaingin of "chasing food off plate"     Upper Body Bathing: Minimal assitance;Sitting (EOB)       Upper Body Dressing : Moderate  assistance Upper Body Dressing Details (indicate cue type and reason): sitting EOB                   General ADL Comments: Pt states " I want to do more for myself      Vision                     Perception     Praxis      Cognition   Behavior During Therapy: [facility] for tasks assessed/performed Overall Cognitive Status: History of cognitive impairments - at baseline                       Extremity/Trunk Assessment               Exercises Other Exercises Other Exercises: LUE PROM as tolerated. Complains of pain with passive extension of R digits   Shoulder Instructions       General Comments      Pertinent Vitals/ Pain       Pain Assessment: Faces Faces Pain Scale: Hurts little more Pain Location: L shoudler Pain Descriptors / Indicators: Grimacing;Guarding;Discomfort Pain Intervention(s): Limited activity within patient's tolerance;Monitored during session;Repositioned  Home Living  Prior Functioning/Environment              Frequency Min 2X/week     Progress Toward Goals  OT Goals(current goals can now be found in the care plan section)  Progress towards OT goals: Progressing toward goals  Acute Rehab OT Goals Patient Stated Goal: to regain independence  OT Goal Formulation: With patient Time For Goal Achievement: 04/30/16 Potential to Achieve Goals: Good ADL Goals Pt Will Perform Eating: with set-up (Pt will use plateguard and AE as needed to assist with set u) Pt Will Perform Upper Body Bathing: with min assist;sitting Pt Will Transfer to Toilet: with mod assist;squat pivot transfer;bedside commode Additional ADL Goal #1: Establish positioning program for LUE with resting hand splint  Plan Discharge plan remains appropriate    Co-evaluation                 End of Session Equipment Utilized During Treatment: Oxygen   Activity Tolerance Patient  tolerated treatment well   Patient Left in bed;with call bell/phone within reach   Nurse Communication Mobility status        Time: 1313-1340 OT Time Calculation (min): 27 min  Charges: OT General Charges $OT Visit: 1 Procedure OT Treatments $Self Care/Home Management : 23-37 mins  Kregg Cihlar,HILLARY 04/28/2016, 1:49 PM   [facility], OTR/L  [phone] 04/28/2016

## 2016-04-28 NOTE — Progress Notes (Signed)
Orthopedic Tech Progress Note Patient Details:  Margaret [name] August 07, 1956 [id number]  Patient ID: [name], female   DOB: Jul 25, 1956, 60 y.o.   MRN: [id number]   [place] Pink 04/28/2016, 2:06 PMCalled [employer] for left resting hand splint

## 2016-04-28 NOTE — Progress Notes (Signed)
[id number] Gave pt the OHS booklet and care guide. Wrote down how to view pre op video. Discussed importance of IS after surgery. Pt does not have one yet. Pt stated she is claustrophobic and wants doors open in 2S. Will be important for PT to continue after surgery and help pt with sternal precautions. Graylon Good RN BSN 04/28/2016 12:12 PM

## 2016-04-29 ENCOUNTER — Inpatient Hospital Stay (HOSPITAL_COMMUNITY): Payer: Medicaid Other

## 2016-04-29 DIAGNOSIS — I340 Nonrheumatic mitral (valve) insufficiency: Secondary | ICD-10-CM

## 2016-04-29 LAB — CARBOXYHEMOGLOBIN
Carboxyhemoglobin: 1.3 % (ref 0.5–1.5)
Methemoglobin: 0.8 % (ref 0.0–1.5)
O2 Saturation: 55.5 %
Total hemoglobin: 11.7 g/dL — ABNORMAL LOW (ref 12.0–16.0)

## 2016-04-29 LAB — COMPREHENSIVE METABOLIC PANEL
ALT: 16 U/L (ref 14–54)
AST: 15 U/L (ref 15–41)
Albumin: 3 g/dL — ABNORMAL LOW (ref 3.5–5.0)
Alkaline Phosphatase: 70 U/L (ref 38–126)
Anion gap: 9 (ref 5–15)
BILIRUBIN TOTAL: 0.5 mg/dL (ref 0.3–1.2)
BUN: 28 mg/dL — AB (ref 6–20)
CO2: 43 mmol/L — ABNORMAL HIGH (ref 22–32)
Calcium: 11.3 mg/dL — ABNORMAL HIGH (ref 8.9–10.3)
Chloride: 83 mmol/L — ABNORMAL LOW (ref 101–111)
Creatinine, Ser: 0.8 mg/dL (ref 0.44–1.00)
Glucose, Bld: 170 mg/dL — ABNORMAL HIGH (ref 65–99)
POTASSIUM: 4.1 mmol/L (ref 3.5–5.1)
Sodium: 135 mmol/L (ref 135–145)
TOTAL PROTEIN: 7.5 g/dL (ref 6.5–8.1)

## 2016-04-29 LAB — GLUCOSE, CAPILLARY
Glucose-Capillary: 148 mg/dL — ABNORMAL HIGH (ref 65–99)
Glucose-Capillary: 150 mg/dL — ABNORMAL HIGH (ref 65–99)
Glucose-Capillary: 129 mg/dL — ABNORMAL HIGH (ref 65–99)
Glucose-Capillary: 136 mg/dL — ABNORMAL HIGH (ref 65–99)

## 2016-04-29 LAB — CBC
HEMATOCRIT: 37.6 % (ref 36.0–46.0)
Hemoglobin: 10.9 g/dL — ABNORMAL LOW (ref 12.0–15.0)
MCH: 25.2 pg — AB (ref 26.0–34.0)
MCHC: 29 g/dL — AB (ref 30.0–36.0)
MCV: 87 fL (ref 78.0–100.0)
Platelets: 570 10*3/uL — ABNORMAL HIGH (ref 150–400)
RBC: 4.32 MIL/uL (ref 3.87–5.11)
RDW: 16.4 % — AB (ref 11.5–15.5)
WBC: 15.4 10*3/uL — AB (ref 4.0–10.5)

## 2016-04-29 LAB — BLOOD GAS, ARTERIAL
Acid-Base Excess: 16.7 mmol/L — ABNORMAL HIGH (ref 0.0–2.0)
BICARBONATE: 40.9 meq/L — AB (ref 20.0–24.0)
DRAWN BY: 312971
FIO2: 0.21
O2 Saturation: 74.3 %
PATIENT TEMPERATURE: 98.6
PCO2 ART: 47.1 mmHg — AB (ref 35.0–45.0)
PH ART: 7.547 — AB (ref 7.350–7.450)
TCO2: 42.3 mmol/L (ref 0–100)
pO2, Arterial: 36 mmHg — CL (ref 80.0–100.0)

## 2016-04-29 LAB — PROTIME-INR
INR: 1.06 (ref 0.00–1.49)
PROTHROMBIN TIME: 14 s (ref 11.6–15.2)

## 2016-04-29 LAB — ABO/RH: ABO/RH(D): A NEG

## 2016-04-29 LAB — APTT: aPTT: 30 seconds (ref 24–37)

## 2016-04-29 MED ORDER — FUROSEMIDE 10 MG/ML IJ SOLN
80.0000 mg | Freq: Once | INTRAMUSCULAR | Status: AC
  Administered 2016-04-29: 80 mg via INTRAVENOUS
  Filled 2016-04-29: qty 8

## 2016-04-29 MED ORDER — METOPROLOL TARTRATE 12.5 MG HALF TABLET
12.5000 mg | ORAL_TABLET | Freq: Once | ORAL | Status: AC
  Administered 2016-04-30: 12.5 mg via ORAL
  Filled 2016-04-29: qty 1

## 2016-04-29 MED ORDER — PHENYLEPHRINE HCL 10 MG/ML IJ SOLN
30.0000 ug/min | INTRAVENOUS | Status: DC
  Filled 2016-04-29: qty 2

## 2016-04-29 MED ORDER — CHLORHEXIDINE GLUCONATE 0.12 % MT SOLN
15.0000 mL | Freq: Once | OROMUCOSAL | Status: AC
  Administered 2016-04-30: 15 mL via OROMUCOSAL
  Filled 2016-04-29: qty 15

## 2016-04-29 MED ORDER — DOPAMINE-DEXTROSE 3.2-5 MG/ML-% IV SOLN
0.0000 ug/kg/min | INTRAVENOUS | Status: DC
  Filled 2016-04-29: qty 250

## 2016-04-29 MED ORDER — MAGNESIUM SULFATE 50 % IJ SOLN
40.0000 meq | INTRAMUSCULAR | Status: DC
  Filled 2016-04-29: qty 10

## 2016-04-29 MED ORDER — DESONIDE 0.05 % EX OINT
TOPICAL_OINTMENT | Freq: Two times a day (BID) | CUTANEOUS | Status: DC
  Filled 2016-04-29: qty 15

## 2016-04-29 MED ORDER — BISACODYL 5 MG PO TBEC
5.0000 mg | DELAYED_RELEASE_TABLET | Freq: Once | ORAL | Status: AC
  Administered 2016-04-29: 5 mg via ORAL
  Filled 2016-04-29: qty 1

## 2016-04-29 MED ORDER — TEMAZEPAM 15 MG PO CAPS: 15.0000 mg | ORAL_CAPSULE | Freq: Once | ORAL | Status: DC | PRN

## 2016-04-29 MED ORDER — SODIUM CHLORIDE 0.9 % IV SOLN
INTRAVENOUS | Status: DC
  Filled 2016-04-29: qty 30

## 2016-04-29 MED ORDER — CHLORHEXIDINE GLUCONATE 4 % EX LIQD
60.0000 mL | Freq: Once | CUTANEOUS | Status: AC
  Administered 2016-04-30: 4 via TOPICAL
  Filled 2016-04-29: qty 60

## 2016-04-29 MED ORDER — VANCOMYCIN HCL 10 G IV SOLR
1500.0000 mg | INTRAVENOUS | Status: DC
  Filled 2016-04-29: qty 1500

## 2016-04-29 MED ORDER — VANCOMYCIN HCL 1000 MG IV SOLR
INTRAVENOUS | Status: AC
  Administered 2016-04-30: 1000 mL
  Filled 2016-04-29: qty 1000

## 2016-04-29 MED ORDER — SODIUM CHLORIDE 0.9 % IV SOLN
INTRAVENOUS | Status: DC
  Filled 2016-04-29: qty 2.5

## 2016-04-29 MED ORDER — PLASMA-LYTE 148 IV SOLN
INTRAVENOUS | Status: DC
  Filled 2016-04-29: qty 2.5

## 2016-04-29 MED ORDER — CEFUROXIME SODIUM 1.5 G IJ SOLR
1.5000 g | INTRAMUSCULAR | Status: AC
  Administered 2016-04-30: .75 g via INTRAVENOUS
  Administered 2016-04-30: 1.5 g via INTRAVENOUS
  Filled 2016-04-29: qty 1.5

## 2016-04-29 MED ORDER — ZINC OXIDE 40 % EX OINT
TOPICAL_OINTMENT | Freq: Every day | CUTANEOUS | Status: DC
  Administered 2016-04-29: 10:00:00 via TOPICAL
  Filled 2016-04-29: qty 114

## 2016-04-29 MED ORDER — NITROGLYCERIN IN D5W 200-5 MCG/ML-% IV SOLN
2.0000 ug/min | INTRAVENOUS | Status: AC
  Administered 2016-04-30: 5 ug/min via INTRAVENOUS
  Filled 2016-04-29: qty 250

## 2016-04-29 MED ORDER — DEXTROSE 5 % IV SOLN
750.0000 mg | INTRAVENOUS | Status: DC
  Filled 2016-04-29: qty 750

## 2016-04-29 MED ORDER — CHLORHEXIDINE GLUCONATE 4 % EX LIQD
60.0000 mL | Freq: Once | CUTANEOUS | Status: AC
  Administered 2016-04-29: 4 via TOPICAL
  Filled 2016-04-29: qty 60

## 2016-04-29 MED ORDER — EPINEPHRINE HCL 1 MG/ML IJ SOLN
0.0000 ug/min | INTRAVENOUS | Status: DC
  Filled 2016-04-29: qty 4

## 2016-04-29 MED ORDER — DEXMEDETOMIDINE HCL IN NACL 400 MCG/100ML IV SOLN
0.1000 ug/kg/h | INTRAVENOUS | Status: DC
  Filled 2016-04-29: qty 100

## 2016-04-29 MED ORDER — DIPHENHYDRAMINE HCL 25 MG PO CAPS
25.0000 mg | ORAL_CAPSULE | ORAL | Status: DC | PRN
  Administered 2016-04-29 (×2): 25 mg via ORAL
  Filled 2016-04-29 (×2): qty 1

## 2016-04-29 MED ORDER — POTASSIUM CHLORIDE 2 MEQ/ML IV SOLN
80.0000 meq | INTRAVENOUS | Status: DC
  Filled 2016-04-29: qty 40

## 2016-04-29 MED ORDER — AMINOCAPROIC ACID 250 MG/ML IV SOLN
INTRAVENOUS | Status: DC
  Filled 2016-04-29: qty 40

## 2016-04-29 NOTE — Progress Notes (Signed)
[address]       [place] [address]             [phone]     CARDIOTHORACIC SURGERY PROGRESS NOTE  6 Days Post-Op  S/P Procedure(s) (LRB): Right/Left Heart Cath and Coronary Angiography (N/A)  Subjective: Patient reports feeling better  Objective: Vital signs in last 24 hours: Temp:  [97.7 F (36.5 C)-98.7 F (37.1 C)] 97.7 F (36.5 C) (06/07 1201) Pulse Rate:  [63-74] 63 (06/07 1201) Cardiac Rhythm:  [-] Normal sinus rhythm (06/07 0700) Resp:  [20] 20 (06/07 1201) BP: (97-107)/(44-70) 98/46 mmHg (06/07 1201) SpO2:  [95 %-98 %] 98 % (06/07 1201) Weight:  [239 lb 1.6 oz (108.455 kg)] 239 lb 1.6 oz (108.455 kg) (06/07 0528)  Physical Exam:  Rhythm:   sinus  Breath sounds: Bibasilar crackles  Heart sounds:  RRR w/ holosystolic murmur  Incisions:  n/a  Abdomen:  soft  Extremities:  Warm, slight rash on right arm and across abdomen   Intake/Output from previous day: 06/06 0701 - 06/07 0700 In: 900 [P.O.:900] Out: 3052 [Urine:3050; Stool:2] Intake/Output this shift:    Lab Results:  Recent Labs  04/29/16 1040  WBC 15.4*  HGB 10.9*  HCT 37.6  PLT 570*   BMET:  Recent Labs  04/28/16 0405 04/29/16 1040  NA  --  135  K  --  4.1  CL  --  83*  CO2  --  43*  GLUCOSE  --  170*  BUN  --  28*  CREATININE 0.97 0.80  CALCIUM  --  11.3*    CBG (last 3)   Recent Labs  04/29/16 0627 04/29/16 1207 04/29/16 1647  GLUCAP 148* 150* 129*   PT/INR:   Recent Labs  04/29/16 1040  LABPROT 14.0  INR 1.06    CXR:  CHEST 2 VIEW  COMPARISON: 04/26/2016  FINDINGS: Right IJ catheter tip is in the distal SVC. Moderate cardiac enlargement noted. There are small bilateral pleural effusions and moderate interstitial edema compatible with CHF. No airspace consolidation.  IMPRESSION: 1. Moderate congestive heart failure.   Electronically Signed  By: Kerby Moors M.D.  On: 04/29/2016 09:3  Assessment/Plan: S/P  Procedure(s) (LRB): Right/Left Heart Cath and Coronary Angiography (N/A)  I have again reviewed the indications, risks, and potential benefits of surgery with the patient and her entire family at the bedside this evening.  The extremely high risks associated with surgery were emphasized. I am particular concern about the risks of postoperative respiratory failure and/or recurrent stroke. However,the patient's prognosis without surgical intervention is grim.  Over the past 2 weeks the patient has done remarkably well with aggressive physical therapy and management of her congestive heart failure. I suspect that her condition will not get any better, and she clearly would do poorly if she was discharged home again without definitively managing her mitral regurgitation.   The likelihood of successful and durable valve repair has been discussed with particular reference to the findings of their recent echocardiogram.  Based upon these findings and previous experience, I have quoted them a less than 50 percent likelihood of successful valve repair.  In the event that their valve cannot be successfully repaired, we discussed the possibility of replacing the mitral valve using a mechanical prosthesis with the attendant need for long-term anticoagulation versus the alternative of replacing it using a bioprosthetic tissue valve with its potential for late structural valve deterioration and failure, depending upon the patient's longevity.  The patient specifically requests that if the mitral valve must be replaced that it be done using a bioprosthetic tissue valve.   The patient understands and accepts all potential risks of surgery including but not limited to risk of death, stroke or other neurologic complication, myocardial infarction, congestive heart failure, respiratory failure, renal failure, bleeding requiring transfusion and/or reexploration, arrhythmia, infection or other wound complications, pneumonia, pleural  and/or pericardial effusion, pulmonary embolus, aortic dissection or other major vascular complication, or delayed complications related to valve repair or replacement including but not limited to structural valve deterioration and failure, thrombosis, embolization, endocarditis, or paravalvular leak.  All of their questions have been answered.   I spent in excess of 30 minutes during the conduct of this hospital encounter and >50% of this time involved direct face-to-face encounter with the patient for counseling and/or coordination of their care.    Rexene Alberts, MD 04/29/2016 5:45PM

## 2016-04-29 NOTE — Progress Notes (Signed)
Blood gas p02 36, RA result called in by Garrison Columbus RT.  Called in to [place], [place].  No new new orders.  Karie Kirks, Therapist, sports.

## 2016-04-29 NOTE — Progress Notes (Signed)
Physical Therapy Treatment Patient Details Name: Margaret [name] MRN: [id number] DOB: May 22, 1956 Today's Date: 04/29/2016    History of Present Illness Margaret [name] is a 60 y.o. female admitted with acute  CHF exacerbation.  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 Working up for ?MVR    PT Comments    Margaret [name] continues to remain very motivated and is attending to her Lt UE more ("I need help w/ my left arm").  She currently requires min assist for sit<>stand transfers using bari stedy to prevent Lt knee buckle.  Weight shifts performed when standing in stedy. Pt will benefit from continued skilled PT services to increase functional independence and safety.   Follow Up Recommendations  Other (comment) (to be determined post-surgery 6/8)     Equipment Recommendations  Wheelchair (measurements PT);Wheelchair cushion (measurements PT);Hospital bed (hoyer lift)    Recommendations for Other Services       Precautions / Restrictions Precautions Precautions: Fall Precaution Comments: L hemiparesis Restrictions Weight Bearing Restrictions: No    Mobility  Bed Mobility Overal bed mobility: Needs Assistance Bed Mobility: Supine to Sit     Supine to sit: HOB elevated;Mod assist     General bed mobility comments: Light mod assist to elevate trunk and to manage Lt LE. Min use of pad for positioning  Transfers Overall transfer level: Needs assistance   Transfers: Sit to/from Stand Sit to Stand: Min assist;From elevated surface Stand pivot transfers:  (pivot via stedy)       General transfer comment: stood x 4 with varying surface height (elevated bed, BSC, chair); support provided to LUE to protect  Ambulation/Gait             General Gait  Details: unable; performed standing weight shifts Lt, Rt   Stairs            Wheelchair Mobility    Modified Rankin (Stroke Patients Only) Modified Rankin (Stroke Patients Only) Pre-Morbid Rankin Score: Severe disability Modified Rankin: Severe disability     Balance Overall balance assessment: Needs assistance Sitting-balance support: Single extremity supported;Feet supported Sitting balance-Leahy Scale: Good     Standing balance support: Single extremity supported;During functional activity Standing balance-Leahy Scale: Poor Standing balance comment: Relies on UE support and assist managing Lt UE                    Cognition Arousal/Alertness: Awake/alert Behavior During Therapy: WFL for tasks assessed/performed Overall Cognitive Status: History of cognitive impairments - at baseline                      Exercises General Exercises - Lower Extremity Ankle Circles/Pumps: PROM;Left;10 reps;Seated Long Arc Quad: AAROM;Limitations;Left;10 reps Long Arc Quad Limitations: able to minimally assist with eccentric lowering leg Hip ABduction/ADduction: Limitations;AROM;Left;10 reps;Seated Hip Abduction/Adduction Limitations: w/ Lt hip brought into extension for Lt foot off floor    General Comments        Pertinent Vitals/Pain Pain Assessment: No/denies pain Pain Intervention(s): Limited activity within patient's tolerance;Monitored during session    Home Living                      Prior Function            PT Goals (current goals  can now be found in the care plan section) Acute Rehab PT Goals Patient Stated Goal: to regain independence  PT Goal Formulation: With patient Time For Goal Achievement: 05/06/16 Potential to Achieve Goals: Good Progress towards PT goals: Progressing toward goals    Frequency  Min 3X/week    PT Plan Current plan remains appropriate    Co-evaluation             End of Session Equipment Utilized  During Treatment: Gait belt Activity Tolerance: Patient tolerated treatment well Patient left: with call bell/phone within reach;in chair;with chair alarm set     Time: 1406-1440 PT Time Calculation (min) (ACUTE ONLY): 34 min  Charges:  $Therapeutic Activity: 23-37 mins                    G Codes:      Collie Siad PT, DPT  Pager: [phone] Phone: [phone] 04/29/2016, 2:54 PM

## 2016-04-29 NOTE — Progress Notes (Signed)
Hospital Problem List     Principal Problem:   Acute on chronic diastolic congestive heart failure due to valvular disease (HCC) Active Problems:   Essential hypertension   Endocarditis of mitral valve   Mitral regurgitation   HLD (hyperlipidemia)   Type 2 diabetes mellitus with circulatory disorder (HCC)   History of stroke   CHF due to valvular disease, acute on chronic, diastolic   Other depression due to general medical condition   Panic disorder without agoraphobia with severe panic attacks     Patient Profile:   Primary Cardiologist: Dr. Meda Coffee  60 y.o. female with a hx of COPD, HFpEF, OSA, HTN, HLD, poorly controlled diabetes, tobacco abuse, and recent embolic infarct and subsequent embolic CVA with subarachnoid hemorrhage (s/p thrombectomy for acute RICA occlusion), + mitral valve SBE who was seen in the office on 5/23 and appeared to be massively fluid overloaded.  Subjective   Denies any chest pain or shortness of breath overnight. Has a rash along her upper extremities and lower right abdomen.   Inpatient Medications    . albuterol  2.5 mg Nebulization QHS  . antiseptic oral rinse  7 mL Mouth Rinse BID  . aspirin  81 mg Oral Daily  . atenolol  25 mg Oral BID  . cholecalciferol  1,000 Units Oral BID  . clonazePAM  0.5 mg Oral BID  . enoxaparin (LOVENOX) injection  40 mg Subcutaneous Q24H  . escitalopram  10 mg Oral QHS  . feeding supplement (ENSURE ENLIVE)  237 mL Oral BID BM  . furosemide  80 mg Oral BID  . gabapentin  200 mg Oral TID  . insulin aspart  0-20 Units Subcutaneous TID WC  . insulin aspart  0-5 Units Subcutaneous QHS  . insulin aspart protamine- aspart  10 Units Subcutaneous BID WC  . linagliptin  5 mg Oral Daily  . metFORMIN  1,000 mg Oral BID WC  . polyethylene glycol  17 g Oral Daily  . pravastatin  10 mg Oral q1800  . sodium chloride flush  10-40 mL Intracatheter Q12H  . sodium chloride flush  3 mL Intravenous Q12H  . sodium chloride  flush  3 mL Intravenous Q12H  . spironolactone  50 mg Oral Daily    Vital Signs    Filed Vitals:   04/28/16 1225 04/28/16 2132 04/29/16 0528 04/29/16 0547  BP: 118/79 99/44  97/48  Pulse: 67 74  67  Temp: 98.5 F (36.9 C) 98.7 F (37.1 C)  98.4 F (36.9 C)  TempSrc: Oral Oral  Oral  Resp: 20 20  20   Height:      Weight:   239 lb 1.6 oz (108.455 kg)   SpO2: 99% 95%  97%    Intake/Output Summary (Last 24 hours) at 04/29/16 0830 Last data filed at 04/29/16 0528  Gross per 24 hour  Intake    900 ml  Output   3052 ml  Net  -2152 ml   Filed Weights   04/27/16 0427 04/28/16 0420 04/29/16 0528  Weight: 234 lb 1.6 oz (106.187 kg) 241 lb 11.2 oz (109.634 kg) 239 lb 1.6 oz (108.455 kg)    Physical Exam    General: Obese Caucasian female appearing in no acute distress. Head: Normocephalic, atraumatic.  Neck: Supple without bruits, JVD not elevated. Lungs:  Resp regular and unlabored, decreased breath sounds along right base. Heart: RRR, S1, S2, no S3, S4, or murmur; no rub. Abdomen: Soft, non-tender, non-distended with normoactive bowel sounds.  No hepatomegaly. No rebound/guarding. No obvious abdominal masses. Erythematous papules along lower right quadrant. Extremities: No clubbing, cyanosis, or edema. Distal pedal pulses are 2+ bilaterally. Neuro: Alert and oriented X 3. Moves all extremities spontaneously. Psych: Normal affect.  Labs    CBC No results for input(s): WBC, NEUTROABS, HGB, HCT, MCV, PLT in the last 72 hours. Basic Metabolic Panel  Recent Labs  04/28/16 0405  CREATININE 0.97    Telemetry    NSR, HR in 60's - 80's. No atopic events.  ECG    No new tracings.   Cardiac Studies and Radiology    Ct Angio Head W/cm &/or Wo Cm: 04/22/2016  CLINICAL DATA:  Subarachnoid hemorrhage and CVA. Cerebral septic emboli. EXAM: CT ANGIOGRAPHY HEAD TECHNIQUE: Multidetector CT imaging of the head was performed using the standard protocol during bolus administration of  intravenous contrast. Multiplanar CT image reconstructions and MIPs were obtained to evaluate the vascular anatomy. CONTRAST:  50 mL Isovue 370 COMPARISON:  MRI brain 02/07/2016. CT head without contrast 02/07/2016. Go to contrast FINDINGS: CT HEAD Brain: There is expected evolution of multiple nonhemorrhagic infarcts involving the posterior right MCA territories. A remote infarct is noted in the posterior limb of the right internal capsule. No definite acute infarct is present. The basal ganglia are intact. The insular ribbon is normal. Previously noted subarachnoid hemorrhage has resolved. No new hemorrhage is present. Calvarium and skull base: Within normal limits. Paranasal sinuses: Clear Orbits: Unremarkable. CTA HEAD Anterior circulation: Atherosclerotic calcifications are present within the cavernous internal carotid artery bilaterally. There is mild narrowing of the left supraclinoid internal carotid artery with luminal narrowing to 1.4 mm. This compares to 2.7 mm at the ICA terminus. The A1 and M1 segments are normal. The anterior communicating artery is patent. Mild distal small vessel disease is present without a significant proximal stenosis or occlusion. Posterior circulation: The left vertebral artery is the dominant vessel. The PICA origins are visualized and normal bilaterally. The basilar artery is within normal limits. Both posterior cerebral arteries originate from basilar tip. Mild distal segmental irregularity is noted in the PCA branch vessels, worse on the left. There is no significant proximal stenosis or occlusion. Venous sinuses: The dural sinuses are patent. Straight sinus and deep cerebral veins are intact. Cortical veins are unremarkable. Anatomic variants: None Delayed phase:Postcontrast images demonstrate no pathologic enhancement. The remote right MCA infarcts are exaggerated. IMPRESSION: 1. Expected evolution of multi focal nonhemorrhagic infarcts in the posterior right MCA  territory. 2. Mild diffuse distal small vessel disease without a significant proximal stenosis, aneurysm, or branch vessel occlusion. Electronically Signed   By: San Morelle M.D.   On: 04/22/2016 16:05    Dg Chest 2 View: 04/26/2016  CLINICAL DATA:  CHF EXAM: CHEST  2 VIEW COMPARISON:  04/15/2016 FINDINGS: Right central line remains in place, unchanged. Cardiomegaly with vascular congestion. Small bilateral pleural effusions. Bilateral airspace disease likely reflects edema/ CHF. Slight improvement since prior study. IMPRESSION: Slight improvement in the pulmonary edema/ CHF pattern. Small bilateral effusions. Electronically Signed   By: Rolm Baptise M.D.   On: 04/26/2016 15:13    Echocardiogram: 04/27/2016 Study Conclusions - Left ventricle: The cavity size was normal. There was moderate  concentric hypertrophy. Systolic function was normal. The  estimated ejection fraction was in the range of 60% to 65%. Wall  motion was normal; there were no regional wall motion  abnormalities. Features are consistent with a pseudonormal left  ventricular filling pattern, with concomitant abnormal relaxation  and increased filling pressure (grade 2 diastolic dysfunction).  Doppler parameters are consistent with elevated ventricular  end-diastolic filling pressure. - Mitral valve: A highly mobile echodensity is attached to the  posterior leaflet of the mitral valve measuring 19 x 9 mm. The  echodensity flops between LA and LV during systole/diastole.  Mitral valve leaflets are thickened predominantly the posterior.  There is severe mitral regurgitation with anteriorly directed  jet. The findings are consistent with mild stenosis. There was  severe regurgitation. - Left atrium: The atrium was moderately dilated. - Right ventricle: Systolic function was normal. - Right atrium: The atrium was normal in size. - Tricuspid valve: There was mild regurgitation. - Pulmonic valve: There was  mild regurgitation. - Pulmonary arteries: Systolic pressure was mildly increased. PA  peak pressure: 40 mm Hg (S). - Pericardium, extracardiac: There was no pericardial effusion.  Impressions: - LVEF is 60-65%. Grade 2 diastolic dysfunction.  Filling pressures are only mildly elevated.  A vegetation on the mitral valve appears smaller when compared to  the prior study from 03/31/2016.  RVEF is normal.  RVSP = 58mmHg.  Assessment & Plan    1. Acute on chronic diastolic heart failure - Pt's weight at 239 lbs today (dry weight 240-245lbs) with net negative -12.9L since admission.  - continue PO Lasix 80mg  BID. Wean O2 as tolerated. - continue BB and Aldactone. BP does not allow for initiation of an ACE-I at this time.  2. 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 Later came back with embolic CVA, confirmed mitral valve vegetation.Seen by Dr. Roxy Manns and felt to be a poor surgical candidate in her current state. Neurology recommended delay of surgery until at least Sept (6 months following CVA/SAH).  - echo 03/31/2016 normal EF, severe MR with highly mobile echodensity attached to the posterior mitral valve leaflet measuring 28mm in diameter, when compare to prior study from 3/21, there might be slight increase in size. Followed by ID, felt her worsening valve dx is likely sequelae of infection rather than ongoing infection. Recommended stop IV abx - L&RHC on 04/23/2016 showed severe MR, severe pulm HTN with PA pressure 96/27, transpulmonic gradient 83mmHg, PVR 6.6 woods units, and normal coronaries. - seen by Dr. Roxy Manns, plan to proceed with high risk mitral valve repair/replacement on 04/30/2016. Repeat echo obtained on 04/27/2016 showing EF 60-65%, grade 2 DD, highly mobile echodensity attached to posterior leaflet of mitral valve measuring 19x9 mm, severe MR with mild MS, mild TR, PA peak  pressure 71mmHg. ID following   3. H/o septic embolic CVA and splenic embolic infarct - CT Angio Head on 04/22/2016 showed expected evolution of multi focal nonhemorrhagic infarcts in the posterior right MCA territory and mild diffuse distal small vessel disease without a significant proximal stenosis, aneurysm, or branch vessel occlusion.  4. Essential hypertension  - BP has been 97/44 - 118/79 in the past 24 hours. - Continue current medication regimen.  5. Hyperlipidemia - continue Pravastatin 10mg   6. Type 2 DM - on metformin, linagliptin, and SSI  7. Anxiety - consulted psychiatry on 5/25 who started her on 0.5mg  BID klonipin with 0.25mg  TID PRN klonipin, lexapro, and gabapentin. - appreciate Psychiatry recommendations.  8. Rash - erythematous papules present along upper extremities and lower left abdomen. Not isolated in one dermatone, so shingles unlikely. No new medications noted on MAR. Says the rash started yesterday, but past excoriations noted. - patient  actively scratching the sites at the time of my encounter. Advised not to do this in an effort to avoid an open-wound in the peri-operative period. - will add Benadryl.  Signed, Erma Heritage , PA-C 8:30 AM 04/29/2016 Pager: [phone]  I have examined the patient and reviewed assessment and plan and discussed with patient.  Agree with above as stated.  Rash improved with Benadryl.  Planned for surgery tomorrow.  ID following along as well.  Cultures have been negative.  Veg still present by echo.   Larae Grooms

## 2016-04-29 NOTE — Progress Notes (Signed)
Pt has a rash to abdomen, c/o itching to rash, cream applied, will continue to monitor.

## 2016-04-30 ENCOUNTER — Inpatient Hospital Stay (HOSPITAL_COMMUNITY): Payer: Medicaid Other | Admitting: Anesthesiology

## 2016-04-30 ENCOUNTER — Encounter (HOSPITAL_COMMUNITY): Payer: Self-pay | Admitting: Thoracic Surgery (Cardiothoracic Vascular Surgery)

## 2016-04-30 ENCOUNTER — Inpatient Hospital Stay (HOSPITAL_COMMUNITY): Payer: Medicaid Other

## 2016-04-30 ENCOUNTER — Encounter (HOSPITAL_COMMUNITY)
Admission: AD | Disposition: A | Discharge status: Rehabilitation Facility | Payer: Self-pay | Source: Ambulatory Visit | Attending: Cardiology

## 2016-04-30 DIAGNOSIS — Z9889 Other specified postprocedural states: Secondary | ICD-10-CM

## 2016-04-30 HISTORY — PX: TEE WITHOUT CARDIOVERSION: SHX5443

## 2016-04-30 HISTORY — DX: Other specified postprocedural states: Z98.890

## 2016-04-30 HISTORY — PX: MITRAL VALVE REPAIR: SHX2039

## 2016-04-30 LAB — POCT I-STAT, CHEM 8
BUN: 35 mg/dL — ABNORMAL HIGH (ref 6–20)
BUN: 29 mg/dL — ABNORMAL HIGH (ref 6–20)
BUN: 33 mg/dL — ABNORMAL HIGH (ref 6–20)
BUN: 29 mg/dL — ABNORMAL HIGH (ref 6–20)
BUN: 30 mg/dL — ABNORMAL HIGH (ref 6–20)
BUN: 30 mg/dL — ABNORMAL HIGH (ref 6–20)
BUN: 24 mg/dL — ABNORMAL HIGH (ref 6–20)
Calcium, Ion: 1.34 mmol/L — ABNORMAL HIGH (ref 1.13–1.30)
Calcium, Ion: 1.17 mmol/L (ref 1.13–1.30)
Calcium, Ion: 1.37 mmol/L — ABNORMAL HIGH (ref 1.13–1.30)
Calcium, Ion: 1.17 mmol/L (ref 1.13–1.30)
Calcium, Ion: 1.2 mmol/L (ref 1.13–1.30)
Calcium, Ion: 1.38 mmol/L — ABNORMAL HIGH (ref 1.13–1.30)
Calcium, Ion: 1.32 mmol/L — ABNORMAL HIGH (ref 1.13–1.30)
Chloride: 84 mmol/L — ABNORMAL LOW (ref 101–111)
Chloride: 83 mmol/L — ABNORMAL LOW (ref 101–111)
Chloride: 85 mmol/L — ABNORMAL LOW (ref 101–111)
Chloride: 87 mmol/L — ABNORMAL LOW (ref 101–111)
Chloride: 92 mmol/L — ABNORMAL LOW (ref 101–111)
Chloride: 91 mmol/L — ABNORMAL LOW (ref 101–111)
Chloride: 99 mmol/L — ABNORMAL LOW (ref 101–111)
Creatinine, Ser: 0.8 mg/dL (ref 0.44–1.00)
Creatinine, Ser: 0.7 mg/dL (ref 0.44–1.00)
Creatinine, Ser: 0.7 mg/dL (ref 0.44–1.00)
Creatinine, Ser: 0.7 mg/dL (ref 0.44–1.00)
Creatinine, Ser: 0.7 mg/dL (ref 0.44–1.00)
Creatinine, Ser: 0.7 mg/dL (ref 0.44–1.00)
Creatinine, Ser: 0.6 mg/dL (ref 0.44–1.00)
Glucose, Bld: 163 mg/dL — ABNORMAL HIGH (ref 65–99)
Glucose, Bld: 146 mg/dL — ABNORMAL HIGH (ref 65–99)
Glucose, Bld: 156 mg/dL — ABNORMAL HIGH (ref 65–99)
Glucose, Bld: 166 mg/dL — ABNORMAL HIGH (ref 65–99)
Glucose, Bld: 182 mg/dL — ABNORMAL HIGH (ref 65–99)
Glucose, Bld: 221 mg/dL — ABNORMAL HIGH (ref 65–99)
Glucose, Bld: 127 mg/dL — ABNORMAL HIGH (ref 65–99)
HCT: 38 % (ref 36.0–46.0)
HCT: 27 % — ABNORMAL LOW (ref 36.0–46.0)
HCT: 38 % (ref 36.0–46.0)
HCT: 27 % — ABNORMAL LOW (ref 36.0–46.0)
HCT: 29 % — ABNORMAL LOW (ref 36.0–46.0)
HCT: 30 % — ABNORMAL LOW (ref 36.0–46.0)
HCT: 33 % — ABNORMAL LOW (ref 36.0–46.0)
Hemoglobin: 12.9 g/dL (ref 12.0–15.0)
Hemoglobin: 12.9 g/dL (ref 12.0–15.0)
Hemoglobin: 9.2 g/dL — ABNORMAL LOW (ref 12.0–15.0)
Hemoglobin: 9.2 g/dL — ABNORMAL LOW (ref 12.0–15.0)
Hemoglobin: 9.9 g/dL — ABNORMAL LOW (ref 12.0–15.0)
Hemoglobin: 10.2 g/dL — ABNORMAL LOW (ref 12.0–15.0)
Hemoglobin: 11.2 g/dL — ABNORMAL LOW (ref 12.0–15.0)
Potassium: 4.4 mmol/L (ref 3.5–5.1)
Potassium: 3.4 mmol/L — ABNORMAL LOW (ref 3.5–5.1)
Potassium: 3.8 mmol/L (ref 3.5–5.1)
Potassium: 4.1 mmol/L (ref 3.5–5.1)
Potassium: 4.1 mmol/L (ref 3.5–5.1)
Potassium: 3.9 mmol/L (ref 3.5–5.1)
Potassium: 4.1 mmol/L (ref 3.5–5.1)
Sodium: 134 mmol/L — ABNORMAL LOW (ref 135–145)
Sodium: 133 mmol/L — ABNORMAL LOW (ref 135–145)
Sodium: 134 mmol/L — ABNORMAL LOW (ref 135–145)
Sodium: 135 mmol/L (ref 135–145)
Sodium: 135 mmol/L (ref 135–145)
Sodium: 136 mmol/L (ref 135–145)
Sodium: 139 mmol/L (ref 135–145)
TCO2: 42 mmol/L (ref 0–100)
TCO2: 35 mmol/L (ref 0–100)
TCO2: 41 mmol/L (ref 0–100)
TCO2: 34 mmol/L (ref 0–100)
TCO2: 37 mmol/L (ref 0–100)
TCO2: 36 mmol/L (ref 0–100)
TCO2: 30 mmol/L (ref 0–100)

## 2016-04-30 LAB — URINALYSIS, ROUTINE W REFLEX MICROSCOPIC
BILIRUBIN URINE: NEGATIVE
Glucose, UA: NEGATIVE mg/dL
Hgb urine dipstick: NEGATIVE
Ketones, ur: NEGATIVE mg/dL
NITRITE: NEGATIVE
PH: 6 (ref 5.0–8.0)
Protein, ur: NEGATIVE mg/dL
SPECIFIC GRAVITY, URINE: 1.017 (ref 1.005–1.030)

## 2016-04-30 LAB — URINE MICROSCOPIC-ADD ON: RBC / HPF: NONE SEEN RBC/hpf (ref 0–5)

## 2016-04-30 LAB — POCT I-STAT 3, ART BLOOD GAS (G3+)
Acid-Base Excess: 12 mmol/L — ABNORMAL HIGH (ref 0.0–2.0)
Acid-Base Excess: 9 mmol/L — ABNORMAL HIGH (ref 0.0–2.0)
Acid-Base Excess: 8 mmol/L — ABNORMAL HIGH (ref 0.0–2.0)
Bicarbonate: 36 mEq/L — ABNORMAL HIGH (ref 20.0–24.0)
Bicarbonate: 35.4 mEq/L — ABNORMAL HIGH (ref 20.0–24.0)
Bicarbonate: 35.8 mEq/L — ABNORMAL HIGH (ref 20.0–24.0)
O2 Saturation: 100 %
O2 Saturation: 95 %
O2 Saturation: 100 %
Patient temperature: 37.1
TCO2: 37 mmol/L (ref 0–100)
TCO2: 37 mmol/L (ref 0–100)
TCO2: 38 mmol/L (ref 0–100)
pCO2 arterial: 41.3 mmHg (ref 35.0–45.0)
pCO2 arterial: 61.5 mmHg (ref 35.0–45.0)
pCO2 arterial: 66.2 mmHg (ref 35.0–45.0)
pH, Arterial: 7.548 — ABNORMAL HIGH (ref 7.350–7.450)
pH, Arterial: 7.368 (ref 7.350–7.450)
pH, Arterial: 7.342 — ABNORMAL LOW (ref 7.350–7.450)
pO2, Arterial: 244 mmHg — ABNORMAL HIGH (ref 80.0–100.0)
pO2, Arterial: 82 mmHg (ref 80.0–100.0)
pO2, Arterial: 187 mmHg — ABNORMAL HIGH (ref 80.0–100.0)

## 2016-04-30 LAB — HEMOGLOBIN AND HEMATOCRIT, BLOOD
HCT: 27.3 % — ABNORMAL LOW (ref 36.0–46.0)
HEMOGLOBIN: 7.7 g/dL — AB (ref 12.0–15.0)

## 2016-04-30 LAB — GLUCOSE, CAPILLARY
Glucose-Capillary: 176 mg/dL — ABNORMAL HIGH (ref 65–99)
Glucose-Capillary: 164 mg/dL — ABNORMAL HIGH (ref 65–99)
Glucose-Capillary: 160 mg/dL — ABNORMAL HIGH (ref 65–99)
Glucose-Capillary: 154 mg/dL — ABNORMAL HIGH (ref 65–99)
Glucose-Capillary: 149 mg/dL — ABNORMAL HIGH (ref 65–99)

## 2016-04-30 LAB — BASIC METABOLIC PANEL
Anion gap: 11 (ref 5–15)
BUN: 30 mg/dL — AB (ref 6–20)
CHLORIDE: 82 mmol/L — AB (ref 101–111)
CO2: 43 mmol/L — AB (ref 22–32)
CREATININE: 0.9 mg/dL (ref 0.44–1.00)
Calcium: 11.5 mg/dL — ABNORMAL HIGH (ref 8.9–10.3)
GFR calc Af Amer: >60 mL/min (ref >60)
GFR calc non Af Amer: >60 mL/min (ref >60)
Glucose, Bld: 117 mg/dL — ABNORMAL HIGH (ref 65–99)
Potassium: 3.9 mmol/L (ref 3.5–5.1)
Sodium: 136 mmol/L (ref 135–145)

## 2016-04-30 LAB — PROTIME-INR
INR: 1.21 (ref 0.00–1.49)
Prothrombin Time: 15.5 seconds — ABNORMAL HIGH (ref 11.6–15.2)

## 2016-04-30 LAB — CBC
HCT: 38.3 % (ref 36.0–46.0)
HEMATOCRIT: 33.5 % — AB (ref 36.0–46.0)
HEMOGLOBIN: 11 g/dL — AB (ref 12.0–15.0)
HEMOGLOBIN: 9.9 g/dL — AB (ref 12.0–15.0)
MCH: 25.1 pg — AB (ref 26.0–34.0)
MCH: 25.3 pg — ABNORMAL LOW (ref 26.0–34.0)
MCHC: 28.7 g/dL — AB (ref 30.0–36.0)
MCHC: 29.6 g/dL — AB (ref 30.0–36.0)
MCV: 87.4 fL (ref 78.0–100.0)
MCV: 85.5 fL (ref 78.0–100.0)
Platelets: 599 10*3/uL — ABNORMAL HIGH (ref 150–400)
Platelets: 352 10*3/uL (ref 150–400)
RBC: 4.38 MIL/uL (ref 3.87–5.11)
RBC: 3.92 MIL/uL (ref 3.87–5.11)
RDW: 16.4 % — ABNORMAL HIGH (ref 11.5–15.5)
RDW: 15.8 % — AB (ref 11.5–15.5)
WBC: 15.6 10*3/uL — ABNORMAL HIGH (ref 4.0–10.5)
WBC: 14.2 10*3/uL — ABNORMAL HIGH (ref 4.0–10.5)

## 2016-04-30 LAB — CULTURE, BLOOD (ROUTINE X 2)
Culture: NO GROWTH
Culture: NO GROWTH

## 2016-04-30 LAB — SURGICAL PCR SCREEN
MRSA, PCR: NEGATIVE
Staphylococcus aureus: NEGATIVE

## 2016-04-30 LAB — PREPARE RBC (CROSSMATCH)

## 2016-04-30 LAB — CREATININE, SERUM
CREATININE: 0.68 mg/dL (ref 0.44–1.00)
GFR calc non Af Amer: >60 mL/min (ref >60)

## 2016-04-30 LAB — HEMOGLOBIN A1C
Hgb A1c MFr Bld: 5.6 % (ref 4.8–5.6)
MEAN PLASMA GLUCOSE: 114 mg/dL

## 2016-04-30 LAB — POCT I-STAT 4, (NA,K, GLUC, HGB,HCT)
Glucose, Bld: 167 mg/dL — ABNORMAL HIGH (ref 65–99)
HCT: 28 % — ABNORMAL LOW (ref 36.0–46.0)
Hemoglobin: 9.5 g/dL — ABNORMAL LOW (ref 12.0–15.0)
Potassium: 3.7 mmol/L (ref 3.5–5.1)
Sodium: 139 mmol/L (ref 135–145)

## 2016-04-30 LAB — MAGNESIUM: MAGNESIUM: 2.5 mg/dL — AB (ref 1.7–2.4)

## 2016-04-30 LAB — FIBRINOGEN: Fibrinogen: 439 mg/dL (ref 204–475)

## 2016-04-30 LAB — CARBOXYHEMOGLOBIN
Carboxyhemoglobin: 1.8 % — ABNORMAL HIGH (ref 0.5–1.5)
Methemoglobin: 0.6 % (ref 0.0–1.5)
O2 Saturation: 66.2 %
Total hemoglobin: 11.9 g/dL — ABNORMAL LOW (ref 12.0–16.0)

## 2016-04-30 LAB — APTT: APTT: 26 s (ref 24–37)

## 2016-04-30 LAB — PLATELET COUNT: Platelets: 372 10*3/uL (ref 150–400)

## 2016-04-30 SURGERY — REPAIR, MITRAL VALVE
Anesthesia: General | Site: Chest

## 2016-04-30 MED ORDER — SODIUM CHLORIDE 0.9 % IV SOLN
10.0000 g | INTRAVENOUS | Status: DC | PRN
  Administered 2016-04-30: 5 g via INTRAVENOUS

## 2016-04-30 MED ORDER — SODIUM CHLORIDE 0.9 % IV SOLN
INTRAVENOUS | Status: DC
  Administered 2016-04-30: 5.6 [IU]/h via INTRAVENOUS
  Filled 2016-04-30 (×2): qty 2.5

## 2016-04-30 MED ORDER — MORPHINE SULFATE (PF) 2 MG/ML IV SOLN
2.0000 mg | INTRAVENOUS | Status: DC | PRN
  Administered 2016-05-01 (×5): 4 mg via INTRAVENOUS
  Filled 2016-04-30 (×5): qty 2

## 2016-04-30 MED ORDER — MAGNESIUM SULFATE 4 GM/100ML IV SOLN
4.0000 g | Freq: Once | INTRAVENOUS | Status: AC
  Administered 2016-04-30: 4 g via INTRAVENOUS
  Filled 2016-04-30: qty 100

## 2016-04-30 MED ORDER — MILRINONE LACTATE IN DEXTROSE 20-5 MG/100ML-% IV SOLN
0.2500 ug/kg/min | INTRAVENOUS | Status: AC
  Administered 2016-04-30: .33 ug/kg/min via INTRAVENOUS
  Filled 2016-04-30: qty 100

## 2016-04-30 MED ORDER — LACTATED RINGERS IV SOLN
INTRAVENOUS | Status: DC | PRN
  Administered 2016-04-30: 07:00:00 via INTRAVENOUS

## 2016-04-30 MED ORDER — PROPOFOL 10 MG/ML IV BOLUS
INTRAVENOUS | Status: DC | PRN
  Administered 2016-04-30: 20 mg via INTRAVENOUS

## 2016-04-30 MED ORDER — ACETAMINOPHEN 160 MG/5ML PO SOLN
1000.0000 mg | Freq: Four times a day (QID) | ORAL | Status: DC
  Administered 2016-04-30 – 2016-05-01 (×2): 1000 mg
  Filled 2016-04-30 (×2): qty 40.6

## 2016-04-30 MED ORDER — PROTAMINE SULFATE 10 MG/ML IV SOLN
INTRAVENOUS | Status: DC | PRN
  Administered 2016-04-30: 350 mg via INTRAVENOUS

## 2016-04-30 MED ORDER — MORPHINE SULFATE (PF) 2 MG/ML IV SOLN
1.0000 mg | INTRAVENOUS | Status: DC | PRN
  Administered 2016-04-30: 2 mg via INTRAVENOUS
  Filled 2016-04-30: qty 1

## 2016-04-30 MED ORDER — HEPARIN SODIUM (PORCINE) 1000 UNIT/ML IJ SOLN
INTRAMUSCULAR | Status: AC
  Filled 2016-04-30: qty 3

## 2016-04-30 MED ORDER — FENTANYL CITRATE (PF) 100 MCG/2ML IJ SOLN
INTRAMUSCULAR | Status: DC | PRN
  Administered 2016-04-30: 250 ug via INTRAVENOUS
  Administered 2016-04-30: 50 ug via INTRAVENOUS
  Administered 2016-04-30 (×4): 250 ug via INTRAVENOUS
  Administered 2016-04-30: 200 ug via INTRAVENOUS

## 2016-04-30 MED ORDER — ALBUMIN HUMAN 5 % IV SOLN
INTRAVENOUS | Status: DC | PRN
  Administered 2016-04-30 (×2): via INTRAVENOUS

## 2016-04-30 MED ORDER — NITROGLYCERIN IN D5W 200-5 MCG/ML-% IV SOLN: 0.0000 ug/min | INTRAVENOUS | Status: DC

## 2016-04-30 MED ORDER — SODIUM CHLORIDE 0.9 % IV SOLN
Freq: Once | INTRAVENOUS | Status: AC
  Administered 2016-04-30: 16:00:00 via INTRAVENOUS

## 2016-04-30 MED ORDER — FENTANYL CITRATE (PF) 250 MCG/5ML IJ SOLN
INTRAMUSCULAR | Status: AC
  Filled 2016-04-30: qty 10

## 2016-04-30 MED ORDER — PHENYLEPHRINE HCL 10 MG/ML IJ SOLN
0.0000 ug/min | Freq: Once | INTRAVENOUS | Status: DC
  Filled 2016-04-30: qty 1

## 2016-04-30 MED ORDER — METOPROLOL TARTRATE 12.5 MG HALF TABLET: 12.5000 mg | ORAL_TABLET | Freq: Two times a day (BID) | ORAL | Status: DC

## 2016-04-30 MED ORDER — SODIUM CHLORIDE 0.9% FLUSH: 3.0000 mL | INTRAVENOUS | Status: DC | PRN

## 2016-04-30 MED ORDER — MILRINONE LACTATE IN DEXTROSE 20-5 MG/100ML-% IV SOLN
0.0000 ug/kg/min | INTRAVENOUS | Status: DC
  Administered 2016-05-01: 0.25 ug/kg/min via INTRAVENOUS
  Filled 2016-04-30 (×3): qty 100

## 2016-04-30 MED ORDER — HEPARIN SODIUM (PORCINE) 1000 UNIT/ML IJ SOLN
INTRAMUSCULAR | Status: DC | PRN
  Administered 2016-04-30: 30000 [IU] via INTRAVENOUS

## 2016-04-30 MED ORDER — SODIUM CHLORIDE 0.9 % IJ SOLN
INTRAMUSCULAR | Status: AC
  Filled 2016-04-30: qty 20

## 2016-04-30 MED ORDER — SODIUM CHLORIDE 0.9 % IV SOLN
250.0000 [IU] | INTRAVENOUS | Status: DC | PRN
  Administered 2016-04-30: 1 [IU]/h via INTRAVENOUS

## 2016-04-30 MED ORDER — BISACODYL 10 MG RE SUPP
10.0000 mg | Freq: Every day | RECTAL | Status: DC
  Administered 2016-05-02: 10 mg via RECTAL
  Filled 2016-04-30: qty 1

## 2016-04-30 MED ORDER — GLUTARALDEHYDE 0.625% SOAKING SOLUTION
TOPICAL | Status: DC
  Filled 2016-04-30: qty 50

## 2016-04-30 MED ORDER — PANTOPRAZOLE SODIUM 40 MG PO TBEC: 40.0000 mg | DELAYED_RELEASE_TABLET | Freq: Every day | ORAL | Status: DC

## 2016-04-30 MED ORDER — NOREPINEPHRINE BITARTRATE 1 MG/ML IV SOLN
0.0000 ug/min | INTRAVENOUS | Status: DC
  Filled 2016-04-30: qty 4

## 2016-04-30 MED ORDER — CALCIUM CHLORIDE 10 % IV SOLN
INTRAVENOUS | Status: DC | PRN
  Administered 2016-04-30 (×4): 200 mg via INTRAVENOUS

## 2016-04-30 MED ORDER — PHENYLEPHRINE HCL 10 MG/ML IJ SOLN
0.0000 ug/min | INTRAMUSCULAR | Status: DC
  Administered 2016-05-01: 15 ug/min via INTRAVENOUS
  Administered 2016-05-01: 45 ug/min via INTRAVENOUS
  Filled 2016-04-30 (×4): qty 2

## 2016-04-30 MED ORDER — VECURONIUM BROMIDE 10 MG IV SOLR
INTRAVENOUS | Status: DC | PRN
  Administered 2016-04-30: 6 mg via INTRAVENOUS
  Administered 2016-04-30 (×2): 4 mg via INTRAVENOUS

## 2016-04-30 MED ORDER — INSULIN REGULAR BOLUS VIA INFUSION
0.0000 [IU] | Freq: Three times a day (TID) | INTRAVENOUS | Status: DC
  Filled 2016-04-30: qty 10

## 2016-04-30 MED ORDER — SODIUM CHLORIDE 0.9 % IV SOLN: Freq: Once | INTRAVENOUS | Status: DC

## 2016-04-30 MED ORDER — MIDAZOLAM HCL 10 MG/2ML IJ SOLN
INTRAMUSCULAR | Status: AC
  Filled 2016-04-30: qty 2

## 2016-04-30 MED ORDER — METOPROLOL TARTRATE 5 MG/5ML IV SOLN
2.5000 mg | INTRAVENOUS | Status: DC | PRN
  Administered 2016-05-04 (×2): 5 mg via INTRAVENOUS
  Filled 2016-04-30 (×3): qty 5

## 2016-04-30 MED ORDER — SODIUM CHLORIDE 0.9 % IV SOLN: INTRAVENOUS | Status: DC

## 2016-04-30 MED ORDER — SODIUM CHLORIDE 0.9 % IV SOLN
250.0000 mL | INTRAVENOUS | Status: DC
  Administered 2016-05-01: 250 mL via INTRAVENOUS

## 2016-04-30 MED ORDER — FENTANYL CITRATE (PF) 250 MCG/5ML IJ SOLN
INTRAMUSCULAR | Status: AC
  Filled 2016-04-30: qty 20

## 2016-04-30 MED ORDER — DEXMEDETOMIDINE HCL IN NACL 200 MCG/50ML IV SOLN
INTRAVENOUS | Status: DC | PRN
  Administered 2016-04-30: .2 ug/kg/h via INTRAVENOUS

## 2016-04-30 MED ORDER — CHLORHEXIDINE GLUCONATE 0.12% ORAL RINSE (MEDLINE KIT)
15.0000 mL | Freq: Two times a day (BID) | OROMUCOSAL | Status: DC
  Administered 2016-04-30 – 2016-05-02 (×4): 15 mL via OROMUCOSAL

## 2016-04-30 MED ORDER — ASPIRIN EC 325 MG PO TBEC: 325.0000 mg | DELAYED_RELEASE_TABLET | Freq: Every day | ORAL | Status: DC

## 2016-04-30 MED ORDER — METOPROLOL TARTRATE 25 MG/10 ML ORAL SUSPENSION: 12.5000 mg | Freq: Two times a day (BID) | ORAL | Status: DC

## 2016-04-30 MED ORDER — ALBUMIN HUMAN 5 % IV SOLN
250.0000 mL | INTRAVENOUS | Status: AC | PRN
  Administered 2016-04-30 (×3): 250 mL via INTRAVENOUS
  Filled 2016-04-30 (×2): qty 250

## 2016-04-30 MED ORDER — POTASSIUM CHLORIDE 10 MEQ/50ML IV SOLN
10.0000 meq | INTRAVENOUS | Status: AC
  Administered 2016-04-30 (×3): 10 meq via INTRAVENOUS

## 2016-04-30 MED ORDER — SODIUM CHLORIDE 0.9% FLUSH
3.0000 mL | Freq: Two times a day (BID) | INTRAVENOUS | Status: DC
  Administered 2016-05-01 – 2016-05-11 (×12): 3 mL via INTRAVENOUS

## 2016-04-30 MED ORDER — SODIUM CHLORIDE 0.45 % IV SOLN
INTRAVENOUS | Status: DC | PRN
  Administered 2016-04-30: 15:00:00 via INTRAVENOUS

## 2016-04-30 MED ORDER — CHLORHEXIDINE GLUCONATE 0.12 % MT SOLN
15.0000 mL | OROMUCOSAL | Status: AC
  Administered 2016-04-30: 15 mL via OROMUCOSAL

## 2016-04-30 MED ORDER — PHENYLEPHRINE HCL 10 MG/ML IJ SOLN
10.0000 mg | INTRAVENOUS | Status: DC | PRN
  Administered 2016-04-30: 25 ug/min via INTRAVENOUS

## 2016-04-30 MED ORDER — VANCOMYCIN HCL IN DEXTROSE 1-5 GM/200ML-% IV SOLN
1000.0000 mg | Freq: Once | INTRAVENOUS | Status: AC
  Administered 2016-04-30: 1000 mg via INTRAVENOUS
  Filled 2016-04-30 (×2): qty 200

## 2016-04-30 MED ORDER — ONDANSETRON HCL 4 MG/2ML IJ SOLN
4.0000 mg | Freq: Four times a day (QID) | INTRAMUSCULAR | Status: DC | PRN
  Administered 2016-05-11 – 2016-05-12 (×3): 4 mg via INTRAVENOUS
  Filled 2016-04-30 (×3): qty 2

## 2016-04-30 MED ORDER — LEVALBUTEROL HCL 1.25 MG/0.5ML IN NEBU
1.2500 mg | INHALATION_SOLUTION | Freq: Four times a day (QID) | RESPIRATORY_TRACT | Status: DC
  Administered 2016-05-01: 1.25 mg via RESPIRATORY_TRACT
  Filled 2016-04-30: qty 0.5

## 2016-04-30 MED ORDER — OXYCODONE HCL 5 MG PO TABS
5.0000 mg | ORAL_TABLET | ORAL | Status: DC | PRN
  Administered 2016-05-06: 5 mg via ORAL
  Filled 2016-04-30: qty 1

## 2016-04-30 MED ORDER — LACTATED RINGERS IV SOLN: 500.0000 mL | Freq: Once | INTRAVENOUS | Status: DC | PRN

## 2016-04-30 MED ORDER — LACTATED RINGERS IV SOLN: INTRAVENOUS | Status: DC

## 2016-04-30 MED ORDER — ROCURONIUM BROMIDE 100 MG/10ML IV SOLN
INTRAVENOUS | Status: DC | PRN
  Administered 2016-04-30: 20 mg via INTRAVENOUS
  Administered 2016-04-30: 80 mg via INTRAVENOUS

## 2016-04-30 MED ORDER — LACTATED RINGERS IV SOLN
INTRAVENOUS | Status: DC | PRN
  Administered 2016-04-30: 08:00:00 via INTRAVENOUS

## 2016-04-30 MED ORDER — PHENYLEPHRINE HCL 10 MG/ML IJ SOLN
INTRAMUSCULAR | Status: DC | PRN
  Administered 2016-04-30: 80 ug via INTRAVENOUS

## 2016-04-30 MED ORDER — ACETAMINOPHEN 500 MG PO TABS
1000.0000 mg | ORAL_TABLET | Freq: Four times a day (QID) | ORAL | Status: AC
  Administered 2016-05-03 – 2016-05-05 (×7): 1000 mg via ORAL
  Filled 2016-04-30 (×7): qty 2

## 2016-04-30 MED ORDER — ACETAMINOPHEN 650 MG RE SUPP
650.0000 mg | Freq: Once | RECTAL | Status: AC
  Administered 2016-04-30: 650 mg via RECTAL

## 2016-04-30 MED ORDER — MIDAZOLAM HCL 2 MG/2ML IJ SOLN
2.0000 mg | INTRAMUSCULAR | Status: DC | PRN
  Administered 2016-04-30 – 2016-05-01 (×7): 2 mg via INTRAVENOUS
  Filled 2016-04-30 (×7): qty 2

## 2016-04-30 MED ORDER — ACETAMINOPHEN 160 MG/5ML PO SOLN: 650.0000 mg | Freq: Once | ORAL | Status: AC

## 2016-04-30 MED ORDER — PROTAMINE SULFATE 10 MG/ML IV SOLN
INTRAVENOUS | Status: AC
  Filled 2016-04-30: qty 50

## 2016-04-30 MED ORDER — EPINEPHRINE HCL 0.1 MG/ML IJ SOSY
0.1000 mg | PREFILLED_SYRINGE | Freq: Once | INTRAMUSCULAR | Status: AC
  Administered 2016-04-30: 0.05 mg via INTRAVENOUS

## 2016-04-30 MED ORDER — BISACODYL 5 MG PO TBEC
10.0000 mg | DELAYED_RELEASE_TABLET | Freq: Every day | ORAL | Status: DC
  Administered 2016-05-09 – 2016-05-12 (×3): 10 mg via ORAL
  Filled 2016-04-30 (×4): qty 2

## 2016-04-30 MED ORDER — LEVALBUTEROL HCL 1.25 MG/0.5ML IN NEBU: 1.2500 mg | INHALATION_SOLUTION | Freq: Four times a day (QID) | RESPIRATORY_TRACT | Status: DC

## 2016-04-30 MED ORDER — FAMOTIDINE IN NACL 20-0.9 MG/50ML-% IV SOLN
20.0000 mg | Freq: Two times a day (BID) | INTRAVENOUS | Status: AC
  Administered 2016-04-30 – 2016-05-01 (×2): 20 mg via INTRAVENOUS
  Filled 2016-04-30 (×2): qty 50

## 2016-04-30 MED ORDER — DOCUSATE SODIUM 100 MG PO CAPS
200.0000 mg | ORAL_CAPSULE | Freq: Every day | ORAL | Status: DC
Start: 2016-05-01 — End: 2016-05-12
  Administered 2016-05-08 – 2016-05-12 (×5): 200 mg via ORAL
  Filled 2016-04-30 (×5): qty 2

## 2016-04-30 MED ORDER — MIDAZOLAM HCL 5 MG/5ML IJ SOLN
INTRAMUSCULAR | Status: DC | PRN
  Administered 2016-04-30: 1 mg via INTRAVENOUS
  Administered 2016-04-30 (×4): 2 mg via INTRAVENOUS

## 2016-04-30 MED ORDER — DEXTROSE 5 % IV SOLN
1.5000 g | Freq: Two times a day (BID) | INTRAVENOUS | Status: AC
  Administered 2016-04-30 – 2016-05-02 (×4): 1.5 g via INTRAVENOUS
  Filled 2016-04-30 (×4): qty 1.5

## 2016-04-30 MED ORDER — ANTISEPTIC ORAL RINSE SOLUTION (CORINZ)
7.0000 mL | Freq: Four times a day (QID) | OROMUCOSAL | Status: DC
  Administered 2016-04-30 – 2016-05-06 (×20): 7 mL via OROMUCOSAL

## 2016-04-30 MED ORDER — SODIUM CHLORIDE 0.9 % IJ SOLN
INTRAMUSCULAR | Status: DC | PRN
  Administered 2016-04-30 (×3): 4 mL via TOPICAL

## 2016-04-30 MED ORDER — SODIUM CHLORIDE 0.9 % IR SOLN
Status: DC | PRN
  Administered 2016-04-30: 500 mL

## 2016-04-30 MED ORDER — SODIUM CHLORIDE 0.9 % IR SOLN
Status: DC | PRN
  Administered 2016-04-30: 6000 mL

## 2016-04-30 MED ORDER — VANCOMYCIN HCL 1000 MG IV SOLR
1000.0000 mg | INTRAVENOUS | Status: DC | PRN
  Administered 2016-04-30: 1500 mg via INTRAVENOUS
  Administered 2016-04-30: 07:00:00 via INTRAVENOUS

## 2016-04-30 MED ORDER — DEXMEDETOMIDINE HCL IN NACL 200 MCG/50ML IV SOLN
0.0000 ug/kg/h | INTRAVENOUS | Status: DC
  Administered 2016-04-30 (×2): 0.7 ug/kg/h via INTRAVENOUS
  Filled 2016-04-30 (×4): qty 50

## 2016-04-30 MED ORDER — EPHEDRINE SULFATE 50 MG/ML IJ SOLN
INTRAMUSCULAR | Status: DC | PRN
  Administered 2016-04-30 (×2): 5 mg via INTRAVENOUS

## 2016-04-30 MED ORDER — ASPIRIN 81 MG PO CHEW
324.0000 mg | CHEWABLE_TABLET | Freq: Every day | ORAL | Status: DC
  Administered 2016-05-01: 324 mg
  Filled 2016-04-30 (×2): qty 4

## 2016-04-30 MED ORDER — TRAMADOL HCL 50 MG PO TABS
50.0000 mg | ORAL_TABLET | ORAL | Status: DC | PRN
  Administered 2016-05-06 – 2016-05-08 (×3): 100 mg via ORAL
  Administered 2016-05-08: 50 mg via ORAL
  Filled 2016-04-30 (×2): qty 2
  Filled 2016-04-30: qty 1
  Filled 2016-04-30: qty 2

## 2016-04-30 MED FILL — Electrolyte-R (PH 7.4) Solution: INTRAVENOUS | Qty: 4000 | Status: AC

## 2016-04-30 MED FILL — Mannitol IV Soln 20%: INTRAVENOUS | Qty: 500 | Status: AC

## 2016-04-30 MED FILL — Potassium Chloride Inj 2 mEq/ML: INTRAVENOUS | Qty: 40 | Status: AC

## 2016-04-30 MED FILL — Lidocaine HCl IV Inj 20 MG/ML: INTRAVENOUS | Qty: 5 | Status: AC

## 2016-04-30 MED FILL — Heparin Sodium (Porcine) Inj 1000 Unit/ML: INTRAMUSCULAR | Qty: 30 | Status: AC

## 2016-04-30 MED FILL — Magnesium Sulfate Inj 50%: INTRAMUSCULAR | Qty: 10 | Status: AC

## 2016-04-30 MED FILL — Sodium Bicarbonate IV Soln 8.4%: INTRAVENOUS | Qty: 50 | Status: AC

## 2016-04-30 MED FILL — Sodium Chloride IV Soln 0.9%: INTRAVENOUS | Qty: 2000 | Status: AC

## 2016-04-30 MED FILL — Heparin Sodium (Porcine) Inj 1000 Unit/ML: INTRAMUSCULAR | Qty: 20 | Status: AC

## 2016-04-30 SURGICAL SUPPLY — 112 items
ADAPTER CARDIO PERF ANTE/RETRO (ADAPTER) ×3 IMPLANT
ADPR PRFSN 84XANTGRD RTRGD (ADAPTER) ×2
APPLICATOR COTTON TIP 6IN STRL (MISCELLANEOUS) IMPLANT
BAG DECANTER FOR FLEXI CONT (MISCELLANEOUS) ×3 IMPLANT
BLADE STERNUM SYSTEM 6 (BLADE) ×3 IMPLANT
BLADE SURG 11 STRL SS (BLADE) ×5 IMPLANT
CANISTER SUCTION 2500CC (MISCELLANEOUS) ×3 IMPLANT
CANN PRFSN 3/8X14X24FR PCFC (MISCELLANEOUS)
CANN PRFSN 3/8XCNCT ST RT ANG (MISCELLANEOUS)
CANNULA AORTIC ROOT 9FR (CANNULA) ×1 IMPLANT
CANNULA EZ GLIDE AORTIC 21FR (CANNULA) ×3 IMPLANT
CANNULA FEM VENOUS REMOTE 22FR (CANNULA) ×1 IMPLANT
CANNULA GUNDRY RCSP 15FR (MISCELLANEOUS) IMPLANT
CANNULA PRFSN 3/8X14X24FR PCFC (MISCELLANEOUS) IMPLANT
CANNULA PRFSN 3/8XCNCT RT ANG (MISCELLANEOUS) IMPLANT
CANNULA VEN MTL TIP RT (MISCELLANEOUS)
CANNULA VENNOUS METAL TIP 20FR (CANNULA) ×1 IMPLANT
CATH FOLEY 2WAY SLVR  5CC 14FR (CATHETERS)
CATH FOLEY 2WAY SLVR 5CC 14FR (CATHETERS) IMPLANT
CATH THORACIC 28FR RT ANG (CATHETERS) IMPLANT
CATH THORACIC 36FR (CATHETERS) ×2 IMPLANT
CLIP FOGARTY SPRING 6M (CLIP) IMPLANT
CONN 1/2X1/2X1/2  BEN (MISCELLANEOUS) ×2
CONN 1/2X1/2X1/2 BEN (MISCELLANEOUS) ×2 IMPLANT
CONN 3/8X1/2 ST GISH (MISCELLANEOUS) ×8 IMPLANT
CONN ST 1/4X3/8  BEN (MISCELLANEOUS) ×1
CONN ST 1/4X3/8 BEN (MISCELLANEOUS) IMPLANT
CONT SPEC 4OZ CLIKSEAL STRL BL (MISCELLANEOUS) ×4 IMPLANT
COVER SURGICAL LIGHT HANDLE (MISCELLANEOUS) ×6 IMPLANT
CRADLE DONUT ADULT HEAD (MISCELLANEOUS) ×3 IMPLANT
DEVICE SUT CK QUICK LOAD MINI (Prosthesis & Implant Heart) ×24 IMPLANT
DRAIN CHANNEL 32F RND 10.7 FF (WOUND CARE) ×4 IMPLANT
DRAPE BILATERAL SPLIT (DRAPES) ×2 IMPLANT
DRAPE CARDIOVASCULAR INCISE (DRAPES)
DRAPE CV SPLIT W-CLR ANES SCRN (DRAPES) ×1 IMPLANT
DRAPE INCISE IOBAN 66X45 STRL (DRAPES) ×4 IMPLANT
DRAPE PROXIMA HALF (DRAPES) ×1 IMPLANT
DRAPE SLUSH/WARMER DISC (DRAPES) IMPLANT
DRAPE SRG 135X102X78XABS (DRAPES) IMPLANT
DRSG COVADERM 4X14 (GAUZE/BANDAGES/DRESSINGS) ×3 IMPLANT
ELECT REM PT RETURN 9FT ADLT (ELECTROSURGICAL) ×6
ELECTRODE REM PT RTRN 9FT ADLT (ELECTROSURGICAL) ×4 IMPLANT
FELT TEFLON 1X6 (MISCELLANEOUS) ×3 IMPLANT
GAUZE SPONGE 4X4 12PLY STRL (GAUZE/BANDAGES/DRESSINGS) ×6 IMPLANT
GLOVE BIO SURGEON STRL SZ 6 (GLOVE) ×6 IMPLANT
GLOVE BIO SURGEON STRL SZ 6.5 (GLOVE) ×4 IMPLANT
GLOVE BIO SURGEON STRL SZ7 (GLOVE) IMPLANT
GLOVE BIO SURGEON STRL SZ7.5 (GLOVE) IMPLANT
GLOVE ORTHO TXT STRL SZ7.5 (GLOVE) IMPLANT
GOWN STRL REUS W/ TWL LRG LVL3 (GOWN DISPOSABLE) ×8 IMPLANT
GOWN STRL REUS W/TWL LRG LVL3 (GOWN DISPOSABLE) ×18
HEMOSTAT POWDER SURGIFOAM 1G (HEMOSTASIS) ×8 IMPLANT
INSERT FOGARTY XLG (MISCELLANEOUS) ×3 IMPLANT
IV NS IRRIG 3000ML ARTHROMATIC (IV SOLUTION) ×4 IMPLANT
KIT BASIN OR (CUSTOM PROCEDURE TRAY) ×3 IMPLANT
KIT DILATOR VASC 18G NDL (KITS) ×4 IMPLANT
KIT ROOM TURNOVER OR (KITS) ×3 IMPLANT
KIT SUCTION CATH 14FR (SUCTIONS) ×9 IMPLANT
KIT SUT CK MINI COMBO 4X17 (Prosthesis & Implant Heart) ×1 IMPLANT
LINE VENT (MISCELLANEOUS) ×2 IMPLANT
LOOP VESSEL SUPERMAXI WHITE (MISCELLANEOUS) ×2 IMPLANT
MARKER GRAFT CORONARY BYPASS (MISCELLANEOUS) IMPLANT
NS IRRIG 1000ML POUR BTL (IV SOLUTION) ×12 IMPLANT
PACK OPEN HEART (CUSTOM PROCEDURE TRAY) ×3 IMPLANT
PAD ARMBOARD 7.5X6 YLW CONV (MISCELLANEOUS) ×6 IMPLANT
RING MITRAL MEMO 3D 26MM SMD26 (Prosthesis & Implant Heart) ×2 IMPLANT
SET CARDIOPLEGIA MPS 5001102 (MISCELLANEOUS) ×1 IMPLANT
SET IRRIG TUBING LAPAROSCOPIC (IRRIGATION / IRRIGATOR) ×4 IMPLANT
SIZER CHORD-X CHORDAL CXCS ×1 IMPLANT
SOLUTION ANTI FOG 6CC (MISCELLANEOUS) ×2 IMPLANT
SPONGE GAUZE 4X4 12PLY STER LF (GAUZE/BANDAGES/DRESSINGS) ×1 IMPLANT
SPONGE LAP 4X18 X RAY DECT (DISPOSABLE) ×2 IMPLANT
SUCKER INTRACARDIAC WEIGHTED (SUCKER) ×3 IMPLANT
SUT BONE WAX W31G (SUTURE) IMPLANT
SUT ETHIBOND (SUTURE) ×3 IMPLANT
SUT ETHIBOND 2 0 SH (SUTURE) ×6 IMPLANT
SUT ETHIBOND 2 0 SH 36X2 (SUTURE) ×4 IMPLANT
SUT ETHIBOND 2 0 V4 (SUTURE) IMPLANT
SUT ETHIBOND 2 0V4 GREEN (SUTURE) IMPLANT
SUT ETHIBOND 2-0 RB-1 WHT (SUTURE) ×6 IMPLANT
SUT ETHIBOND 4 0 TF (SUTURE) IMPLANT
SUT ETHIBOND 5 0 C 1 30 (SUTURE) ×3 IMPLANT
SUT ETHIBOND X763 2 0 SH 1 (SUTURE) IMPLANT
SUT MNCRL AB 3-0 PS2 18 (SUTURE) IMPLANT
SUT PDS AB 1 CTX 36 (SUTURE) IMPLANT
SUT PROLENE 3 0 SH 1 (SUTURE) ×3 IMPLANT
SUT PROLENE 4 0 RB 1 (SUTURE) ×21
SUT PROLENE 4 0 SH DA (SUTURE) ×8 IMPLANT
SUT PROLENE 4-0 RB1 .5 CRCL 36 (SUTURE) ×9 IMPLANT
SUT PTFE CHORD X 20MM (SUTURE) ×1 IMPLANT
SUT SILK  1 MH (SUTURE) ×2
SUT SILK 1 MH (SUTURE) IMPLANT
SUT STEEL 6MS V (SUTURE) IMPLANT
SUT STEEL STERNAL CCS#1 18IN (SUTURE) IMPLANT
SUT STEEL SZ 6 DBL 3X14 BALL (SUTURE) ×6 IMPLANT
SUT VIC AB 1 CTX 18 (SUTURE) ×1 IMPLANT
SUT VIC AB 2-0 CTX 27 (SUTURE) IMPLANT
SWAB COLLECTION DEVICE MRSA (MISCELLANEOUS) ×2 IMPLANT
SYR BULB IRRIGATION 50ML (SYRINGE) ×2 IMPLANT
SYSTEM SAHARA CHEST DRAIN ATS (WOUND CARE) ×3 IMPLANT
TAPE CLOTH SURG 4X10 WHT LF (GAUZE/BANDAGES/DRESSINGS) ×2 IMPLANT
TAPE PAPER 2X10 WHT MICROPORE (GAUZE/BANDAGES/DRESSINGS) ×2 IMPLANT
TOWEL OR 17X24 6PK STRL BLUE (TOWEL DISPOSABLE) ×3 IMPLANT
TOWEL OR 17X26 10 PK STRL BLUE (TOWEL DISPOSABLE) ×3 IMPLANT
TRAY FOLEY CATH SILVER 16FR LF (SET/KITS/TRAYS/PACK) ×2 IMPLANT
TRAY FOLEY IC TEMP SENS 16FR (CATHETERS) ×3 IMPLANT
TRAY FOLEY SILVER 16FR TEMP (SET/KITS/TRAYS/PACK) ×2 IMPLANT
TUBE ANAEROBIC SPECIMEN COL (MISCELLANEOUS) ×1 IMPLANT
TUBING INSUFFLATION 10FT LAP (TUBING) ×3 IMPLANT
UNDERPAD 30X30 INCONTINENT (UNDERPADS AND DIAPERS) ×3 IMPLANT
WATER STERILE IRR 1000ML POUR (IV SOLUTION) ×6 IMPLANT
YANKAUER SUCT BULB TIP NO VENT (SUCTIONS) ×2 IMPLANT

## 2016-04-30 NOTE — Progress Notes (Signed)
[facility] BRIEF SICU PROGRESS NOTE  Day of Surgery  S/P Procedure(s) (LRB): MITRAL VALVE REPAIR  (N/A) TRANSESOPHAGEAL ECHOCARDIOGRAM (TEE) (N/A)   Sedated on vent Brief episode of hypotension and increased pulmonary hypertension shortly after arrival to SICU - resolved quickly w/ adjustments to vent, nebulized Xopenex, and intravenous volume Currently NSR w/ stable hemodynamics and relatively low PA pressures on low dose Neo, milrinone and nitric oxide Chest tube output low UOP adequate Labs okay Repeat ABG pending  Plan: Continue current plan.  Will wean FiO2 but keep on vent at least overnight  Rexene Alberts, MD 04/30/2016 5:54 PM

## 2016-04-30 NOTE — Anesthesia Preprocedure Evaluation (Signed)
Anesthesia Evaluation  Patient identified by MRN, date of birth, ID band Patient awake    Reviewed: Allergy & Precautions, NPO status , Patient's Chart, lab work & pertinent test results, reviewed documented beta blocker date and time   History of Anesthesia Complications Negative for: history of anesthetic complications  Airway Mallampati: III  TM Distance: >3 FB Neck ROM: Full    Dental no notable dental hx.    Pulmonary shortness of breath, asthma , COPD,  COPD inhaler, former smoker,     + decreased breath sounds      Cardiovascular hypertension, Pt. on medications and Pt. on home beta blockers + Peripheral Vascular Disease and +CHF   Rhythm:Regular     Neuro/Psych  Headaches, PSYCHIATRIC DISORDERS Anxiety CVA, Residual Symptoms    GI/Hepatic negative GI ROS, Neg liver ROS,   Endo/Other  diabetes  Renal/GU      Musculoskeletal  (+) Arthritis ,   Abdominal   Peds  Hematology  (+) anemia ,   Anesthesia Other Findings   Reproductive/Obstetrics                             Anesthesia Physical Anesthesia Plan  ASA: IV  Anesthesia Plan: General   Post-op Pain Management:    Induction: Intravenous  Airway Management Planned: Oral ETT  Additional Equipment: Arterial line, TEE, 3D TEE, CVP, PA Cath and Ultrasound Guidance Line Placement  Intra-op Plan:   Post-operative Plan: Post-operative intubation/ventilation  Informed Consent: I have reviewed the patients History and Physical, chart, labs and discussed the procedure including the risks, benefits and alternatives for the proposed anesthesia with the patient or authorized representative who has indicated his/her understanding and acceptance.   Dental advisory given  Plan Discussed with: CRNA and Surgeon  Anesthesia Plan Comments:         Anesthesia Quick Evaluation

## 2016-04-30 NOTE — Progress Notes (Signed)
  Echocardiogram Echocardiogram Transesophageal has been performed.  Jennette Dubin 04/30/2016, 8:38 AM

## 2016-04-30 NOTE — Transfer of Care (Signed)
Immediate Anesthesia Transfer of Care Note  Patient: Margaret [name]  Procedure(s) Performed: Procedure(s): MITRAL VALVE REPAIR  (N/A) TRANSESOPHAGEAL ECHOCARDIOGRAM (TEE) (N/A)  Patient Location: SICU  Anesthesia Type:General  Level of Consciousness: sedated, unresponsive and Patient remains intubated per anesthesia plan  Airway & Oxygen Therapy: Patient remains intubated per anesthesia plan and Patient placed on Ventilator (see vital sign flow sheet for setting)  Post-op Assessment: Report given to RN and Post -op Vital signs reviewed and stable  Post vital signs: Reviewed and stable  Last Vitals:  Filed Vitals:   04/29/16 2348 04/30/16 0500  BP: 112/65 116/64  Pulse: 79 70  Temp:    Resp:      Last Pain:  Filed Vitals:   04/30/16 0530  PainSc: 0-No pain      Patients Stated Pain Goal: 2 ([id number])  Complications: No apparent anesthesia complications

## 2016-04-30 NOTE — Anesthesia Procedure Notes (Addendum)
Procedure Name: Intubation Date/Time: 04/30/2016 7:55 AM Performed by: Rebekah Chesterfield L Pre-anesthesia Checklist: Patient identified, Emergency Drugs available, Suction available and Patient being monitored Patient Re-evaluated:Patient Re-evaluated prior to inductionOxygen Delivery Method: Circle System Utilized Preoxygenation: Pre-oxygenation with 100% oxygen Intubation Type: IV induction Ventilation: Mask ventilation without difficulty and Oral airway inserted - appropriate to patient size Laryngoscope Size: Mac and 3 Grade View: Grade I Tube type: Subglottic suction tube Tube size: 7.5 mm Number of attempts: 1 Airway Equipment and Method: Stylet Placement Confirmation: ETT inserted through vocal cords under direct vision,  positive ETCO2 and breath sounds checked- equal and bilateral Secured at: 21 cm Tube secured with: Tape Dental Injury: Teeth and Oropharynx as per pre-operative assessment    Central Venous Catheter Insertion Performed by: anesthesiologist Patient location: OR. Preanesthetic checklist: patient identified, IV checked, site marked, risks and benefits discussed, surgical consent, monitors and equipment checked, pre-op evaluation, timeout performed and anesthesia consent Position: Trendelenburg Landmarks identified and Seldinger technique used Catheter size: 8.5 Fr Central line was placed.Sheath introducer Procedure performed using ultrasound guided technique. Attempts: 1 Following insertion, line sutured and dressing applied. Post procedure assessment: blood return through all ports, free fluid flow and no air. Patient tolerated the procedure well with no immediate complications.    Central Venous Catheter Insertion Performed by: anesthesiologist Patient location: OR. Preanesthetic checklist: patient identified, IV checked, site marked, risks and benefits discussed, surgical consent, monitors and equipment checked, pre-op evaluation, timeout performed and  anesthesia consent Position: Trendelenburg Landmarks identified and Seldinger technique used Catheter size: 8 Fr Central line was placed.Double lumen Procedure performed using ultrasound guided technique. Attempts: 1 Following insertion, dressing applied and line sutured. Post procedure assessment: blood return through all ports. Patient tolerated the procedure well with no immediate complications.    Central Venous Catheter Insertion Performed by: anesthesiologist Patient location: Pre-op. Preanesthetic checklist: patient identified, IV checked, site marked, risks and benefits discussed, surgical consent, monitors and equipment checked, pre-op evaluation, timeout performed and anesthesia consent Landmarks identified PA cath was placed.Swan type and PA catheter depth:thermodilation and 50PA Cath depth:50 Procedure performed without using ultrasound guided technique. Attempts: 1 Patient tolerated the procedure well with no immediate complications.

## 2016-04-30 NOTE — OR Nursing (Signed)
Per Dr. Roxy Manns, glutaraldehyde used to soak piece of patient's pericardium to be used as patch

## 2016-04-30 NOTE — OR Nursing (Signed)
14:10 - 2nd call to SICU

## 2016-04-30 NOTE — Op Note (Signed)
CARDIOTHORACIC SURGERY OPERATIVE NOTE  Date of Procedure:  04/30/2016  Preoperative Diagnosis: Severe Mitral Regurgitation  Postoperative Diagnosis: Same  Procedure:    Mitral Valve Repair  Complex valvuloplasty including autologous pericardial patch repair of perforated posterior leaflet  Artificial Gore-tex neochord placement x6  Sorin Memo 3D ring annuloplasty (size 15mm, catalog [id number], serial [id number])  Surgeon: Valentina Gu. Roxy Manns, MD  Assistant: Ellwood Handler, PA-C  Anesthesia: Laurie Panda, MD  Operative Findings:  Flail segment of posterior leaflet (P3) with leaflet perforation and multiple ruptured chordae tendineae  Type II mitral valve dysfunction with severe mitral regurgitation  No sign of active infection  Normal LV systolic function  Severe pulmonary hypertension  No residual mitral regurgitation after successful valve repair                   BRIEF CLINICAL NOTE AND INDICATIONS FOR SURGERY  Patient is a 60 year old morbidly obese female with complex past medical history who originally seen in consultation on 02/06/2016 during her complex hospitalization 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 At that time she suffered a massive stroke. She was eventually discharged to [facility] for rehabilitation where she remains at this time. Since hospital discharge she has been seen in follow-up on several occasions by Richardson Dopp at [facility], Dr. Erlinda Hong at [facility], and Dr. Linus Salmons in the infectious disease clinic. She recently underwent follow-up transthoracic echocardiogram that revealed normal left ventricular systolic function with severe mitral regurgitation and a highly mobile echo density adherent to the posterior leaflet of the mitral valve consistent with possible residual vegetation. She was referred for  cardiothoracic surgical follow-up and seen in the office on 04/13/2016.  At that time the patient was in class IV congestive heart failure. She was hospitalized on the medical service and treated with high-dose diuretic therapy.  Over approximately 2 weeks the patient's clinical condition gradually improved She was able to demonstrate the ability to make progress with physical therapy with regards to her limitations from her previous stroke. She was seen in follow-up by the infectious disease team. Follow-up blood cultures off antibiotic therapy remain no growth The patient was cleared for surgery by the dental service. The patient was evaluated by the psychiatry service because of behavioral challenges. She continued to make significant strides overall. Follow-up echocardiogram confirmed the presence of torrential mitral regurgitation with possible residual vegetation on her mitral valve.High risk surgical intervention was offered.  The patient and her family have been counseled at length regarding the indications, risks and potential benefits of surgery.  All questions have been answered, and the patient provides full informed consent for the operation as described.   DETAILS OF THE OPERATIVE PROCEDURE  Preparation:  The patient is brought to the operating room on the above mentioned date and central monitoring was established by the anesthesia team including placement of Swan-Ganz catheter and radial arterial line.  There was severe pulmonary hypertension at baseline with PA pressures approaching systemic. The patient is placed in the supine position on the operating table.  Intravenous antibiotics are administered. General endotracheal anesthesia is induced uneventfully. A Foley catheter is placed.  Baseline transesophageal echocardiogram was performed.  Findings were notable for type II dysfunction of the mitral valve with an obvious flail segment (P3) of the posterior leaflet and severe mitral  regurgitation.  It is difficult to tell whether or not there remains a vegetation on the  mitral valve or the mobile appearance of the posterior leaflet is simply residual debris from previous infection.  There are no signs of vegetations on any of the other valves.  There is normal left ventricular function.The aortic valve appears normal Tricuspid annulus was normal diameter and there was only trace tricuspid regurgitation.   [name] chest, abdomen, both groins, and both lower extremities are prepared and draped in a sterile manner. A time out procedure is performed.   Surgical Approach:  A median sternotomy incision was performed and the pericardium is opened. The ascending aorta is normal in appearance.  A portion of the patient's pericardium was excised and tanned in 0.625% glutaraldehyde solution for 3 minutes, after which time it is rinsed in 3 consecutive baths of saline per protocol.   Extracorporeal Cardiopulmonary Bypass and Myocardial Protection:  The right common femoral vein is cannulated using the Seldinger technique and a guidewire advanced into the right atrium using TEE guidance.  The patient is heparinized systemically and the right common femoral vein cannulated using a 22 Fr long femoral venous cannula.  The ascending aorta is cannulated for cardiopulmonary bypass.  Adequate heparinization is verified.   A retrograde cardioplegia cannula is placed through the right atrium into the coronary sinus.   The entire pre-bypass portion of the operation was notable for stable hemodynamics.  Cardiopulmonary bypass was begun and the surface of the heart is inspected.  A second venous cannula is placed directly into the superior vena cava.   A cardioplegia cannula is placed in the ascending aorta.  A temperature probe was placed in the interventricular septum.  The operative field is continuously flooded with carbon dioxide.  The patient is cooled to 32C systemic temperature.  The  aortic cross clamp is applied and cold blood cardioplegia is delivered initially in an antegrade fashion through the aortic root.   Supplemental cardioplegia is given retrograde through the coronary sinus catheter.  Iced saline slush is applied for topical hypothermia.  The initial cardioplegic arrest is rapid with early diastolic arrest.  Repeat doses of cardioplegia are administered intermittently throughout the entire cross clamp portion of the operation through the aortic root and through the coronary sinus catheter in order to maintain completely flat electrocardiogram and septal myocardial temperature below 15C.  Myocardial protection was felt to be excellent.   Mitral Valve Repair:  A left atriotomy incision was performed through the interatrial groove and extended partially across the back wall of the left atrium after opening the oblique sinus inferiorly.  The mitral valve is exposed using a self-retaining retractor.  The mitral valve was inspected and notable for an obvious perforation of the P3 segment of the posterior leaflet that involves the free margin of the leaflet. All of the primary chordae tendineae P3 segment were destroyed (ruptured).  There appeared to be some debris adherent to the free margin of leaflet but no signs of active vegetations per se.  There was no erythema in the surrounding tissues. Swab culture of the valve was obtained. The tattered free margin of the P3 segment of the posterior leaflet was excised sharply and all associated leaflet material and debris were sent for stat Gram stain, culture,and histology. The valve was irrigated using a pulse lavage irrigation device and sterilized with multiple antibiotic solution.   Artificial neochord placement was performed using Chord-X multi-strand CV-4 Goretex pre-measured loops.  The appropriate cord length was measured from corresponding normal length primary cords from the P2 segment of the posterior leaflet.  The papillary  muscle suture of the Chord-X multi-strand suture was placed through the head of the posterior papillary muscle in a horizontal mattress fashion and tied over Teflon felt pledgets. Each of the three pre-measured loops were then reimplanted into the free margin of the P3 segment of the posterior leaflet.  One pair of neochords was implanted on the P2 side of the defect, another on the P3 side of the defect towards the posterior commissure. The third pair was implanted at the level of repair of the defect. The intervening vertical defect was closed using everting simple 4-0 Prolene sutures.  Interrupted 2-0 Ethibond horizontal mattress sutures were placed circumferentially around the entire mitral annulus.  The valve was tested with saline and appeared competent even without ring annuloplasty complete. The valve was sized to a 26 mm annuloplasty ring, based upon the transverse distance between the left and right commissures and the height of the anterior leaflet, corresponding to a size just slightly larger than the overall surface area of the anterior leaflet.  A Sorin Memo 3D annuloplasty ring (size 22mm, catalog [id number], serial [id number]) was secured in place uneventfully. The valve was tested with saline and appeared competent, but there was some restriction of the posterior leaflet in the P2 and P3 regions that resulted in inadequate surface area of coaptation.  The P2 and P3 segments of the posterior leaflet were subsequently augmented using a patch of autologous pericardium.A linear incision is made in the posterior leaflet through this region close to the posterior annulus Some calcification the posterior annulus is debrided.  A portion of autologous pericardium was trimmed to a small elliptical shape and sewn in place using running 4-0 Prolene suture.  The valve is again tested with saline and appears to be perfectly competent with a broad symmetrical line of coaptation of the anterior and posterior  leaflet. There is no residual leak. There was a broad, symmetrical line of coaptation of the anterior and posterior leaflet which was confirmed using the blue ink test.  Rewarming is begun.   Procedure Completion:  The atriotomy was closed using a 2-layer closure of running 3-0 Prolene suture after placing a sump drain across the mitral valve to serve as a left ventricular vent.  One final dose of warm retrograde "hot shot" cardioplegia was administered retrograde through the coronary sinus catheter while all air was evacuated through the aortic root.  The aortic cross clamp was removed after a total cross clamp time of 131 minutes.  Epicardial pacing wires are fixed to the right ventricular outflow tract and to the right atrial appendage. The patient is rewarmed to 37C temperature. The aortic and left ventricular vents are removed.  The patient is weaned and disconnected from cardiopulmonary bypass.  [name] rhythm at separation from bypass was sinus.  The patient was weaned from cardioplegic bypass on low dose milrinone and neosynephrine infusions. Total cardiopulmonary bypass time for the operation was 156 minutes.  Followup transesophageal echocardiogram performed after separation from bypass revealed a well-seated mitral annuloplasty ring and a mitral valve that was functioning normally and without any residual mitral regurgitation.  Mean transvalvular gradient across the mitral valve was estimated between 3 and 5 mmHg.  Left ventricular function was unchanged from preoperatively.  The aortic and superior vena cava cannula were removed uneventfully. Protamine was administered to reverse the anticoagulation. The femoral venous cannula was removed and manual pressure held on the groin for 30 minutes.  The mediastinum and pleural space were inspected for  hemostasis and irrigated with saline solution. The mediastinum and both pleural spaces were drained using 4 chest tubes placed through separate  stab incisions inferiorly.  The soft tissues anterior to the aorta were reapproximated loosely. The sternum is closed with double strength sternal wire. The soft tissues anterior to the sternum were closed in multiple layers and the skin is closed with a running subcuticular skin closure.   The post-bypass portion of the operation was notable for stable rhythm and hemodynamics.  The patient was notably very resistant to heparin during the procedure.  She patient received a total of 2 units fresh frozen plasma due to coagulopathy after separation from cardiopulmonary bypass and reversal of heparin with protamine.    Disposition:  The patient tolerated the procedure well.  The patient was transported to the surgical intensive care unit in stable condition. There were no intraoperative complications. All sponge instrument and needle counts are verified correct at completion of the operation.    Valentina Gu. Roxy Manns MD 04/30/2016 2:28 PM

## 2016-04-30 NOTE — Progress Notes (Addendum)
Notified MD Roxy Manns and RT about ABG results. MD Roxy Manns ordered to titrate FI02 overnight.  Levon Hedger, RN

## 2016-04-30 NOTE — Brief Op Note (Addendum)
04/14/2016 - 04/30/2016  12:21 PM  PATIENT:  Margaret [name]  60 y.o. female  PRE-OPERATIVE DIAGNOSIS:  SEVERE MR  POST-OPERATIVE DIAGNOSIS:  SEVERE MR  PROCEDURE:  Procedure(s):  MEDIAN STERNOTOMY  MITRAL VALVE REPAIR -Ring Annuloplasty utilizing a 26 mm Sorin Memo 3D Ring -Autologous Pericardial Patch of Perforation of Posterior Leaflet -Placement of 6 Neo Chords with Chord-x  TRANSESOPHAGEAL ECHOCARDIOGRAM (TEE) (N/A)  SURGEON:    Rexene Alberts, MD  ASSISTANTS:  Ellwood Handler, PA-C  ANESTHESIA:   Oleta Mouse, MD  CROSSCLAMP TIME:   131'  CARDIOPULMONARY BYPASS TIME: 156'  FINDINGS:  Flail segment of posterior leaflet (P3) with leaflet perforation and multiple ruptured chordae tendineae  Type II mitral valve dysfunction with severe mitral regurgitation  No sign of active infection  Normal LV systolic function  Severe pulmonary hypertension  No residual mitral regurgitation after successful valve repair   Mitral Valve Etiology  MV Insufficiency: Severe  MV Disease: No.  MV Stenosis: No mitral valve stenosis.  MV Disease Functional Class: MV Disease Functional Class: Type II.  Etiology (Choose at least one and up to five): Endocarditis.  MV Lesions (Choose at least one): Other.perforation of posterior leaflet, Ruptured chordae tendineae   Mitral/Tricuspid/Pulmonary Valve Procedure  Mitral Valve Procedure Performed:  Repair: Annuloplasty. and Neochrods. Number of Neochords Inserted: 6, leaflet patch augmentation  Implant: Annuloplasty Device: Implant model number [id number], Size 26, Unique Device Identifier [id number].   COMPLICATIONS: None  BASELINE WEIGHT: 108 kg  PATIENT DISPOSITION:   TO SICU IN STABLE CONDITION  Rexene Alberts, MD 04/30/2016 2:18 PM

## 2016-05-01 ENCOUNTER — Inpatient Hospital Stay (HOSPITAL_COMMUNITY): Payer: Medicaid Other

## 2016-05-01 ENCOUNTER — Encounter (HOSPITAL_COMMUNITY): Payer: Self-pay | Admitting: Thoracic Surgery (Cardiothoracic Vascular Surgery)

## 2016-05-01 DIAGNOSIS — I330 Acute and subacute infective endocarditis: Secondary | ICD-10-CM

## 2016-05-01 DIAGNOSIS — Z9889 Other specified postprocedural states: Secondary | ICD-10-CM

## 2016-05-01 DIAGNOSIS — I639 Cerebral infarction, unspecified: Secondary | ICD-10-CM

## 2016-05-01 DIAGNOSIS — B9689 Other specified bacterial agents as the cause of diseases classified elsewhere: Secondary | ICD-10-CM

## 2016-05-01 LAB — BASIC METABOLIC PANEL
ANION GAP: 6 (ref 5–15)
BUN: 22 mg/dL — ABNORMAL HIGH (ref 6–20)
CO2: 31 mmol/L (ref 22–32)
Calcium: 9.7 mg/dL (ref 8.9–10.3)
Chloride: 100 mmol/L — ABNORMAL LOW (ref 101–111)
Creatinine, Ser: 0.62 mg/dL (ref 0.44–1.00)
GFR calc Af Amer: >60 mL/min (ref >60)
GFR calc non Af Amer: >60 mL/min (ref >60)
GLUCOSE: 111 mg/dL — AB (ref 65–99)
POTASSIUM: 4.7 mmol/L (ref 3.5–5.1)
Sodium: 137 mmol/L (ref 135–145)

## 2016-05-01 LAB — PREPARE FRESH FROZEN PLASMA
UNIT DIVISION: 0
Unit division: 0

## 2016-05-01 LAB — CBC
HCT: 33.2 % — ABNORMAL LOW (ref 36.0–46.0)
HCT: 32.6 % — ABNORMAL LOW (ref 36.0–46.0)
HEMOGLOBIN: 9.9 g/dL — AB (ref 12.0–15.0)
HEMOGLOBIN: 9.6 g/dL — AB (ref 12.0–15.0)
MCH: 25.4 pg — AB (ref 26.0–34.0)
MCH: 25.5 pg — AB (ref 26.0–34.0)
MCHC: 29.8 g/dL — ABNORMAL LOW (ref 30.0–36.0)
MCHC: 29.4 g/dL — ABNORMAL LOW (ref 30.0–36.0)
MCV: 85.3 fL (ref 78.0–100.0)
MCV: 86.7 fL (ref 78.0–100.0)
PLATELETS: 327 10*3/uL (ref 150–400)
Platelets: 367 10*3/uL (ref 150–400)
RBC: 3.89 MIL/uL (ref 3.87–5.11)
RBC: 3.76 MIL/uL — AB (ref 3.87–5.11)
RDW: 16.1 % — AB (ref 11.5–15.5)
RDW: 16.1 % — ABNORMAL HIGH (ref 11.5–15.5)
WBC: 15 10*3/uL — ABNORMAL HIGH (ref 4.0–10.5)
WBC: 18.9 10*3/uL — AB (ref 4.0–10.5)

## 2016-05-01 LAB — CREATININE, SERUM
CREATININE: 0.97 mg/dL (ref 0.44–1.00)
GFR calc non Af Amer: >60 mL/min (ref >60)

## 2016-05-01 LAB — GLUCOSE, CAPILLARY
Glucose-Capillary: 104 mg/dL — ABNORMAL HIGH (ref 65–99)
Glucose-Capillary: 106 mg/dL — ABNORMAL HIGH (ref 65–99)
Glucose-Capillary: 109 mg/dL — ABNORMAL HIGH (ref 65–99)
Glucose-Capillary: 116 mg/dL — ABNORMAL HIGH (ref 65–99)
Glucose-Capillary: 119 mg/dL — ABNORMAL HIGH (ref 65–99)
Glucose-Capillary: 119 mg/dL — ABNORMAL HIGH (ref 65–99)
Glucose-Capillary: 120 mg/dL — ABNORMAL HIGH (ref 65–99)
Glucose-Capillary: 123 mg/dL — ABNORMAL HIGH (ref 65–99)
Glucose-Capillary: 123 mg/dL — ABNORMAL HIGH (ref 65–99)
Glucose-Capillary: 125 mg/dL — ABNORMAL HIGH (ref 65–99)
Glucose-Capillary: 125 mg/dL — ABNORMAL HIGH (ref 65–99)
Glucose-Capillary: 126 mg/dL — ABNORMAL HIGH (ref 65–99)
Glucose-Capillary: 168 mg/dL — ABNORMAL HIGH (ref 65–99)
Glucose-Capillary: 172 mg/dL — ABNORMAL HIGH (ref 65–99)
Glucose-Capillary: 87 mg/dL (ref 65–99)
Glucose-Capillary: 99 mg/dL (ref 65–99)

## 2016-05-01 LAB — BLOOD GAS, ARTERIAL
Acid-Base Excess: 6.2 mmol/L — ABNORMAL HIGH (ref 0.0–2.0)
BICARBONATE: 30.7 meq/L — AB (ref 20.0–24.0)
Drawn by: 364961
FIO2: 0.4
LHR: 20 {breaths}/min
O2 Saturation: 98.7 %
PATIENT TEMPERATURE: 100.4
PCO2 ART: 51.7 mmHg — AB (ref 35.0–45.0)
PEEP: 5 cmH2O
PO2 ART: 119 mmHg — AB (ref 80.0–100.0)
TCO2: 32.3 mmol/L (ref 0–100)
VT: 450 mL
pH, Arterial: 7.397 (ref 7.350–7.450)

## 2016-05-01 LAB — POCT I-STAT, CHEM 8
BUN: 26 mg/dL — ABNORMAL HIGH (ref 6–20)
Calcium, Ion: 1.44 mmol/L — ABNORMAL HIGH (ref 1.13–1.30)
Chloride: 97 mmol/L — ABNORMAL LOW (ref 101–111)
Creatinine, Ser: 0.9 mg/dL (ref 0.44–1.00)
Glucose, Bld: 169 mg/dL — ABNORMAL HIGH (ref 65–99)
HCT: 33 % — ABNORMAL LOW (ref 36.0–46.0)
Hemoglobin: 11.2 g/dL — ABNORMAL LOW (ref 12.0–15.0)
Potassium: 4.7 mmol/L (ref 3.5–5.1)
Sodium: 139 mmol/L (ref 135–145)
TCO2: 30 mmol/L (ref 0–100)

## 2016-05-01 LAB — POCT I-STAT 3, ART BLOOD GAS (G3+)
Acid-Base Excess: 4 mmol/L — ABNORMAL HIGH (ref 0.0–2.0)
Bicarbonate: 29.8 mEq/L — ABNORMAL HIGH (ref 20.0–24.0)
O2 Saturation: 96 %
Patient temperature: 99.7
TCO2: 31 mmol/L (ref 0–100)
pCO2 arterial: 49.8 mmHg — ABNORMAL HIGH (ref 35.0–45.0)
pH, Arterial: 7.388 (ref 7.350–7.450)
pO2, Arterial: 85 mmHg (ref 80.0–100.0)

## 2016-05-01 LAB — CARBOXYHEMOGLOBIN
Carboxyhemoglobin: 2.4 % — ABNORMAL HIGH (ref 0.5–1.5)
Methemoglobin: 0.9 % (ref 0.0–1.5)
O2 Saturation: 59.8 %
Total hemoglobin: 9.2 g/dL — ABNORMAL LOW (ref 12.0–16.0)

## 2016-05-01 LAB — MAGNESIUM
Magnesium: 2.2 mg/dL (ref 1.7–2.4)
Magnesium: 1.9 mg/dL (ref 1.7–2.4)

## 2016-05-01 MED ORDER — INSULIN ASPART 100 UNIT/ML ~~LOC~~ SOLN
0.0000 [IU] | SUBCUTANEOUS | Status: DC
  Administered 2016-05-01 (×3): 4 [IU] via SUBCUTANEOUS
  Administered 2016-05-02 – 2016-05-06 (×8): 2 [IU] via SUBCUTANEOUS

## 2016-05-01 MED ORDER — FUROSEMIDE 10 MG/ML IJ SOLN
4.0000 mg/h | INTRAVENOUS | Status: DC
  Administered 2016-05-01: 4 mg/h via INTRAVENOUS
  Filled 2016-05-01: qty 25

## 2016-05-01 MED ORDER — TRAZODONE HCL 50 MG PO TABS
50.0000 mg | ORAL_TABLET | Freq: Every evening | ORAL | Status: DC | PRN
Start: 2016-05-01 — End: 2016-05-05
  Administered 2016-05-05: 50 mg via ORAL
  Filled 2016-05-01: qty 1

## 2016-05-01 MED ORDER — MORPHINE SULFATE (PF) 2 MG/ML IV SOLN
1.0000 mg | INTRAVENOUS | Status: DC | PRN
  Administered 2016-05-01 – 2016-05-02 (×4): 2 mg via INTRAVENOUS
  Filled 2016-05-01 (×4): qty 1

## 2016-05-01 MED ORDER — CLONAZEPAM 0.5 MG PO TABS
0.5000 mg | ORAL_TABLET | Freq: Two times a day (BID) | ORAL | Status: DC
  Administered 2016-05-03 – 2016-05-04 (×2): 0.5 mg via ORAL
  Filled 2016-05-01 (×4): qty 1

## 2016-05-01 MED ORDER — LORAZEPAM 2 MG/ML IJ SOLN
0.5000 mg | INTRAMUSCULAR | Status: DC | PRN
  Administered 2016-05-01 – 2016-05-02 (×7): 0.5 mg via INTRAVENOUS
  Filled 2016-05-01 (×7): qty 1

## 2016-05-01 MED ORDER — INSULIN DETEMIR 100 UNIT/ML ~~LOC~~ SOLN
20.0000 [IU] | Freq: Two times a day (BID) | SUBCUTANEOUS | Status: DC
  Administered 2016-05-01 – 2016-05-03 (×5): 20 [IU] via SUBCUTANEOUS
  Filled 2016-05-01 (×7): qty 0.2

## 2016-05-01 MED ORDER — DEXMEDETOMIDINE HCL IN NACL 400 MCG/100ML IV SOLN
0.0000 ug/kg/h | INTRAVENOUS | Status: DC
  Administered 2016-05-01: 0.5 ug/kg/h via INTRAVENOUS
  Administered 2016-05-01 (×2): 0.7 ug/kg/h via INTRAVENOUS
  Administered 2016-05-02: 0.5 ug/kg/h via INTRAVENOUS
  Filled 2016-05-01 (×7): qty 100

## 2016-05-01 MED ORDER — ESCITALOPRAM OXALATE 10 MG PO TABS
10.0000 mg | ORAL_TABLET | Freq: Every day | ORAL | Status: DC
  Administered 2016-05-03 – 2016-05-11 (×9): 10 mg via ORAL
  Filled 2016-05-01 (×9): qty 1

## 2016-05-01 MED ORDER — INSULIN DETEMIR 100 UNIT/ML ~~LOC~~ SOLN
20.0000 [IU] | Freq: Once | SUBCUTANEOUS | Status: AC
  Administered 2016-05-01: 20 [IU] via SUBCUTANEOUS
  Filled 2016-05-01: qty 0.2

## 2016-05-01 NOTE — Progress Notes (Signed)
CT surgery p.m. Rounds  Patient with delirium after mitral valve replacement for endocarditis-history of preoperative significant CVA. Patient's electrolytes blood sugar are satisfactory, oxygenation adequate Postoperative white count elevated 18,000-Will panculture Continue supportive care, protect airway and keep nothing by mouth

## 2016-05-01 NOTE — Anesthesia Postprocedure Evaluation (Signed)
Anesthesia Post Note  Patient: HADASAH LAURANCE  Procedure(s) Performed: Procedure(s) (LRB): MITRAL VALVE REPAIR  (N/A) TRANSESOPHAGEAL ECHOCARDIOGRAM (TEE) (N/A)  Patient location during evaluation: ICU Anesthesia Type: General Level of consciousness: sedated Pain management: pain level controlled Vital Signs Assessment: post-procedure vital signs reviewed and stable Respiratory status: patient remains intubated per anesthesia plan Cardiovascular status: stable Postop Assessment: no signs of nausea or vomiting Anesthetic complications: no    Last Vitals:  Filed Vitals:   05/01/16 0800 05/01/16 0900  BP: 97/62 94/61  Pulse: 86   Temp: 38.2 C 38.1 C  Resp: 28 26    Last Pain:  Filed Vitals:   05/01/16 0950  PainSc: Asleep                 Roshad Hack

## 2016-05-01 NOTE — Addendum Note (Signed)
Addendum  created 05/01/16 1506 by Josephine Igo, CRNA   Modules edited: Charges VN

## 2016-05-01 NOTE — Procedures (Signed)
Extubation Procedure Note  Patient Details:   Name: Margaret [name] DOB: Nov 05, 1956 MRN: [id number]   Airway Documentation:  Airway 7.5 mm (Active)  Secured at (cm) 21 cm 05/01/2016  7:33 AM  Measured From Lips 05/01/2016  7:33 AM  Secured Location Left 05/01/2016  7:33 AM  Secured By [employer] 05/01/2016  7:33 AM  Tube Holder Repositioned Yes 05/01/2016  7:33 AM  Cuff Pressure (cm H2O) 24 cm H2O 05/01/2016  7:33 AM  Site Condition Dry 05/01/2016  7:33 AM    Evaluation  O2 sats: stable throughout Complications: No apparent complications Patient did tolerate procedure well. Bilateral Breath Sounds: Clear, Diminished   Yes   Patient extubated to 2lnc with 5ppm of Nitric. No complications. ELINK made aware. RN at bedside. RT will continue to monitor.  Mcneil Sober 05/01/2016, 10:53 AM

## 2016-05-01 NOTE — Progress Notes (Signed)
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 Meda Coffee  60 y.o. female with a hx of COPD, HFpEF, OSA, HTN, HLD, poorly controlled diabetes, tobacco abuse, and recent embolic infarct and subsequent embolic CVA with subarachnoid hemorrhage (s/p thrombectomy for acute RICA occlusion), + mitral valve SBE who was seen in the office on 5/23 and appeared to be massively fluid overloaded.  Subjective   Receiving sedating medications and unable to answer questions. Saying "get out" with her eyes closed and when no once is in the room. Currently paced on telemetry.  Inpatient Medications    . acetaminophen  1,000 mg Oral Q6H  . antiseptic oral rinse  7 mL Mouth Rinse QID  . aspirin EC  325 mg Oral Daily  . bisacodyl  10 mg Oral Daily   Or  . bisacodyl  10 mg Rectal Daily  . cefUROXime (ZINACEF)  IV  1.5 g Intravenous Q12H  . chlorhexidine gluconate (SAGE KIT)  15 mL Mouth Rinse BID  . clonazePAM  0.5 mg Oral BID  . docusate sodium  200 mg Oral Daily  . escitalopram  10 mg Oral QHS  . insulin aspart  0-24 Units Subcutaneous Q4H  . insulin detemir  20 Units Subcutaneous BID  . levalbuterol  1.25 mg Nebulization Q6H  . [START ON 05/02/2016] pantoprazole  40 mg Oral Daily  . sodium chloride flush  3 mL Intravenous Q12H    Vital Signs    Filed Vitals:   05/01/16 0700 05/01/16 0732 05/01/16 0800 05/01/16 0900  BP: 82/56  97/62 94/61  Pulse: 86  86   Temp:  100.9 F (38.3 C)  100.8 F (38.2 C) 100.6 F (38.1 C)  TempSrc:   Core (Comment)   Resp: 0 _0 Height:      Weight:      SpO2: 100%  100%     Intake/Output Summary (Last 24 hours) at 05/01/16 1504 Last data filed at 05/01/16 1000  Gross per 24 hour  Intake 5168.03 ml  Output   1539 ml  Net 3629.03 ml   Filed Weights   04/29/16 0528 04/30/16 0349 05/01/16 0400  Weight: 239 lb 1.6 oz (108.455 kg) 234 lb 9.1 oz (106.4 kg) 263 lb 0.1 oz (119.3 kg)    Physical Exam    General: Obese Caucasian  female appearing in no acute distress. Head: Normocephalic, atraumatic.  Neck: Supple without bruits, JVD not elevated. Lungs:  Resp regular and unlabored, decreased breath sounds at bases bilaterally. Heart: RRR, S1, S2, no S3, S4, valve sounds present; no rub. Abdomen: Soft, non-tender, non-distended with normoactive bowel sounds. No hepatomegaly. No rebound/guarding. No obvious abdominal masses. Extremities: No clubbing, cyanosis, or edema. Distal pedal pulses are 2+ bilaterally. Neuro: Alert and oriented X 3. Moves all extremities spontaneously. Psych: Normal affect.  Labs    CBC  Recent Labs  04/30/16 2144 05/01/16 0317  WBC 14.2* 15.0*  HGB  9.9* 9.9*  HCT 33.5* 33.2*  MCV 85.5 85.3  PLT 352 859   Basic Metabolic Panel  Recent Labs  04/30/16 0430  04/30/16 2127 04/30/16 2208 05/01/16 0317  NA 136  < > 139  --  137  K 3.9  < > 4.1  --  4.7  CL 82*  < > 99*  --  100*  CO2 43*  --   --   --  31  GLUCOSE 117*  < > 127*  --  111*  BUN 30*  < > 24*  --  22*  CREATININE 0.90  < > 0.60 0.68 0.62  CALCIUM 11.5*  --   --   --  9.7  MG  --   --   --  2.5* 2.2  < > = values in this interval not displayed. Liver Function Tests  Recent Labs  04/29/16 1040  AST 15  ALT 16  ALKPHOS 70  BILITOT 0.5  PROT 7.5  ALBUMIN 3.0*   Hemoglobin A1C  Recent Labs  04/29/16 1040  HGBA1C 5.6   Fasting Lipid Panel No results for input(s): CHOL, HDL, LDLCALC, TRIG,  CHOLHDL, LDLDIRECT in the last 72 hours. Thyroid Function Tests No results for input(s): TSH, T4TOTAL, T3FREE, THYROIDAB in the last 72 hours.  Invalid input(s): FREET3  Telemetry    Paced, HR mid-80's.  ECG    Sinus bradycardia, HR 45, no acute ST or T-wave changes.   Cardiac Studies and Radiology    Ct Angio Head W/cm &/or Wo Cm  04/22/2016  CLINICAL DATA:  Subarachnoid hemorrhage and CVA. Cerebral septic emboli. EXAM: CT ANGIOGRAPHY HEAD TECHNIQUE: Multidetector CT imaging of the head was performed using the standard protocol during bolus administration of intravenous contrast. Multiplanar CT image reconstructions and MIPs were obtained to evaluate the vascular anatomy. CONTRAST:  50 mL Isovue 370 COMPARISON:  MRI brain 02/07/2016. CT head without contrast 02/07/2016. Go to contrast FINDINGS: CT HEAD Brain: There is expected evolution of multiple nonhemorrhagic infarcts involving the posterior right MCA territories. A remote infarct is noted in the posterior limb of the right internal capsule. No definite acute infarct is present. The basal ganglia are intact. The insular ribbon is normal. Previously noted subarachnoid hemorrhage has resolved. No new hemorrhage is present. Calvarium and skull base: Within normal limits. Paranasal sinuses: Clear Orbits: Unremarkable. CTA HEAD Anterior circulation: Atherosclerotic calcifications are present within the cavernous internal carotid artery bilaterally. There is mild narrowing of the left supraclinoid internal carotid artery with luminal narrowing to 1.4 mm. This compares to 2.7 mm at the ICA terminus. The A1 and M1 segments are normal. The anterior communicating artery is patent. Mild distal small vessel disease is present without a significant proximal stenosis or occlusion. Posterior circulation: The left vertebral artery is the dominant vessel. The PICA origins are visualized and normal bilaterally. The basilar artery is within normal limits. Both  posterior cerebral arteries originate from basilar tip. Mild distal segmental irregularity is noted in the PCA branch vessels, worse on the left. There is no significant proximal stenosis or occlusion. Venous sinuses: The dural sinuses are patent. Straight sinus and deep cerebral veins are intact. Cortical veins are unremarkable. Anatomic variants: None Delayed phase:Postcontrast images demonstrate no pathologic enhancement. The remote right MCA infarcts are exaggerated. IMPRESSION: 1. Expected evolution of multi focal nonhemorrhagic infarcts in the posterior right MCA territory. 2. Mild diffuse distal small vessel disease without a significant proximal stenosis, aneurysm, or branch vessel occlusion. Electronically Signed  By: San Morelle M.D.   On: 04/22/2016 16:05   Dg Orthopantogram  04/15/2016  CLINICAL DATA:  60 year old female with cardiac valvular disease and CHF. Poor dentition. Initial encounter. EXAM: ORTHOPANTOGRAM/PANORAMIC COMPARISON:  CTA neck 02/01/2016 FINDINGS: Due to motion artifact on this exam despite repeated attempts detail of the central mandibular dentition is inferior to that on 02/01/2016. Maxillary dentition is absent. No definite dental caries or periapical dental lucency is identified. The bilateral mandibular molars are absent. IMPRESSION: Due to motion and difficulty with this exam, dental detail is better on the 02/01/2016 Neck CTA. Maxillary dentition is absent. No definite acute mandible dental inflammation. Electronically Signed   By: Genevie Ann M.D.   On: 04/15/2016 07:41   Dg Chest 2 View  04/29/2016  CLINICAL DATA:  Shortness of breath and weakness. EXAM: CHEST  2 VIEW COMPARISON:  04/26/2016 FINDINGS: Right IJ catheter tip is in the distal SVC. Moderate cardiac enlargement noted. There are small bilateral pleural effusions and moderate interstitial edema compatible with CHF. No airspace consolidation. IMPRESSION: 1. Moderate congestive heart failure. Electronically  Signed   By: Kerby Moors M.D.   On: 04/29/2016 09:33   Dg Chest 2 View  04/26/2016  CLINICAL DATA:  CHF EXAM: CHEST  2 VIEW COMPARISON:  04/15/2016 FINDINGS: Right central line remains in place, unchanged. Cardiomegaly with vascular congestion. Small bilateral pleural effusions. Bilateral airspace disease likely reflects edema/ CHF. Slight improvement since prior study. IMPRESSION: Slight improvement in the pulmonary edema/ CHF pattern. Small bilateral effusions. Electronically Signed   By: Rolm Baptise M.D.   On: 04/26/2016 15:13   Dg Chest 2 View  04/15/2016  CLINICAL DATA:  CHF due to valvular disease. EXAM: CHEST  2 VIEW COMPARISON:  04/14/2016 FINDINGS: Mild progression of diffuse bilateral airspace disease consistent with edema. Right lower lobe volume loss and right effusion unchanged. No significant effusion on the left. Right jugular central venous catheter tip in the SVC unchanged. IMPRESSION: Diffuse bilateral pulmonary edema with mild progression Right lower lobe volume loss and small right effusion unchanged. Electronically Signed   By: Franchot Gallo M.D.   On: 04/15/2016 07:38   Dg Chest Port 1 View  05/01/2016  CLINICAL DATA:  Status post CABG. EXAM: PORTABLE CHEST 1 VIEW COMPARISON:  04/30/2016 FINDINGS: Endotracheal tube, enteric catheter, right Swan-Ganz catheter, mediastinal drain and bilateral chest tubes are stable in position. Cardiomediastinal silhouette is enlarged. There is no evidence of r pneumothorax. There is worsened aeration of the lungs with increased interstitial and airspace opacities most consistent with pulmonary edema. Small bilateral pleural effusions cannot be excluded. Osseous structures are without acute abnormality. Soft tissues are grossly normal. IMPRESSION: Worsening of mixed interstitial and alveolar pulmonary edema. Small bilateral pleural effusions cannot be excluded. Electronically Signed   By: Fidela Salisbury M.D.   On: 05/01/2016 07:46   Dg Chest  Port 1 View  04/30/2016  CLINICAL DATA:  Mitral valve repair postop EXAM: PORTABLE CHEST 1 VIEW COMPARISON:  04/29/2016 FINDINGS: Endotracheal tube tip 3.5 cm above the carina. Bilateral chest tubes with mediastinal drain. Orogastric tube distal tip not visualized. Tip appears at or near the gastroesophageal junction. Moderate postoperative edema and bibasilar atelectasis. IMPRESSION: Lines and tubes as described. Cannot identify tip of orogastric tube confidently. Moderate postoperative pulmonary edema. Electronically Signed   By: Skipper Cliche M.D.   On: 04/30/2016 15:53   Dg Chest Port 1 View  04/14/2016  CLINICAL DATA:  Post PICC line placement. EXAM: PORTABLE CHEST 1  VIEW COMPARISON:  02/12/2016 FINDINGS: Examination demonstrates interval placement of right central venous catheter with tip over the SVC. No evidence of pneumothorax. The Lungs are hypoinflated with moderate perihilar bibasilar opacification likely interstitial edema, although cannot exclude infection. Likely small right pleural effusion. Stable cardiomegaly. There is calcified plaque over the aortic arch. Remainder of the exam is unchanged. IMPRESSION: Moderate perihilar and bibasilar opacification which may be due to interstitial edema versus infection. Small right pleural effusion. Stable cardiomegaly. Right IJ central venous catheter with tip over the SVC. No pneumothorax. Electronically Signed   By: Marin Olp M.D.   On: 04/14/2016 22:02    Echocardiogram: 04/27/2016 Study Conclusions  - Left ventricle: The cavity size was normal. There was moderate  concentric hypertrophy. Systolic function was normal. The  estimated ejection fraction was in the range of 60% to 65%. Wall  motion was normal; there were no regional wall motion  abnormalities. Features are consistent with a pseudonormal left  ventricular filling pattern, with concomitant abnormal relaxation  and increased filling pressure (grade 2 diastolic  dysfunction).  Doppler parameters are consistent with elevated ventricular  end-diastolic filling pressure. - Mitral valve: A highly mobile echodensity is attached to the  posterior leaflet of the mitral valve measuring 19 x 9 mm. The  echodensity flops between LA and LV during systole/diastole.  Mitral valve leaflets are thickened predominantly the posterior.  There is severe mitral regurgitation with anteriorly directed  jet. The findings are consistent with mild stenosis. There was  severe regurgitation. - Left atrium: The atrium was moderately dilated. - Right ventricle: Systolic function was normal. - Right atrium: The atrium was normal in size. - Tricuspid valve: There was mild regurgitation. - Pulmonic valve: There was mild regurgitation. - Pulmonary arteries: Systolic pressure was mildly increased. PA  peak pressure: 40 mm Hg (S). - Pericardium, extracardiac: There was no pericardial effusion.  Impressions: - LVEF is 60-65%. Grade 2 diastolic dysfunction.  Filling pressures are only mildly elevated.  A vegetation on the mitral valve appears smaller when compared to  the prior study from 03/31/2016.  RVEF is normal.  RVSP = 40mHg.  Assessment & Plan    1. Chronic diastolic heart failure - Pt's weight at 239 lbs prior to surgery. + 5.0L yesterday. Will likely require further diuresis once further out from surgery and BP is stable.  2. 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 Later came back with embolic CVA, confirmed mitral valve vegetation.Seen by Dr. ORoxy Mannsand felt to be a poor surgical candidate in her current state. Neurology recommended delay of surgery until at least Sept (6 months following CVA/SAH).  - echo 03/31/2016 normal EF, severe MR with highly mobile echodensity attached to the posterior mitral valve leaflet measuring 240min diameter,  when compare to prior study from 3/21, there might be slight increase in size. Followed by ID, felt her worsening valve dx is likely sequelae of infection rather than ongoing infection. Recommended stop IV abx - L&RHC on 04/23/2016 showed severe MR, severe pulm HTN with PA pressure 96/27, transpulmonic gradient 3557m, PVR 6.6 woods units, and normal coronaries. - underwent Mitral Valve Repair on 04/30/2016. Currently paced with HR in the mid-80's. Extubated this AM and pressor support being weaned as tolerated. Per CT Surgery Team.  3. H/o septic embolic CVA and splenic embolic infarct - CT Angio Head on 04/22/2016 showed expected evolution of multi focal nonhemorrhagic infarcts  in the posterior right MCA territory and mild diffuse distal small vessel disease without a significant proximal stenosis, aneurysm, or branch vessel occlusion.  Signed, Erma Heritage , PA-C 3:04 PM 05/01/2016 Pager: [phone]   I have examined the patient and reviewed assessment and plan and discussed with patient.  Agree with above as stated.  Confused post surgery.  Paced rhythm.  Larae Grooms

## 2016-05-01 NOTE — Progress Notes (Signed)
[facility] for Infectious Disease   Reason for visit: Follow up on IE  Interval History: had mitral valve repair yesterday; tissue cultures, pathology sent yesterday  Physical Exam: Constitutional:  Filed Vitals:   05/01/16 0700 05/01/16 0732  BP: 82/56   Pulse: 86   Temp: 100.9 F (38.3 C)   Resp: 0 23   sedated Respiratory: on vent Cardiovascular: tachy RR  Review of Systems: Unable to be assessed due to mental status  Lab Results  Component Value Date   WBC 15.0* 05/01/2016   HGB 9.9* 05/01/2016   HCT 33.2* 05/01/2016   MCV 85.3 05/01/2016   PLT 367 05/01/2016    Lab Results  Component Value Date   CREATININE 0.62 05/01/2016   BUN 22* 05/01/2016   NA 137 05/01/2016   K 4.7 05/01/2016   CL 100* 05/01/2016   CO2 31 05/01/2016    Lab Results  Component Value Date   ALT 16 04/29/2016   AST 15 04/29/2016   ALKPHOS 70 04/29/2016     Microbiology: Recent Results (from the past 240 hour(s))  Culture, blood (Routine X 2) w Reflex to ID Panel     Status: None   Collection Time: 04/25/16 11:30 AM  Result Value Ref Range Status   Specimen Description BLOOD LEFT ANTECUBITAL  Final   Special Requests BOTTLES DRAWN AEROBIC AND ANAEROBIC 10CC  Final   Culture NO GROWTH 5 DAYS  Final   Report Status 04/30/2016 FINAL  Final  Culture, blood (Routine X 2) w Reflex to ID Panel     Status: None   Collection Time: 04/25/16 11:45 AM  Result Value Ref Range Status   Specimen Description BLOOD BLOOD RIGHT HAND  Final   Special Requests IN PEDIATRIC BOTTLE 3CC  Final   Culture NO GROWTH 5 DAYS  Final   Report Status 04/30/2016 FINAL  Final  Surgical pcr screen     Status: None   Collection Time: 04/30/16  1:16 AM  Result Value Ref Range Status   MRSA, PCR NEGATIVE NEGATIVE Final   Staphylococcus aureus NEGATIVE NEGATIVE Final    Comment:        The Xpert SA Assay (FDA approved for NASAL specimens in patients over 44 years of age), is one component of a  comprehensive surveillance program.  Test performance has been validated by [facility] for patients greater than or equal to 30 year old. It is not intended to diagnose infection nor to guide or monitor treatment.   Aerobic/Anaerobic Culture(surg specimen)     Status: None (Preliminary result)   Collection Time: 04/30/16 10:47 AM  Result Value Ref Range Status   Specimen Description TISSUE SWAB OF INSIDE MITRAL VALVE SPEC A  Final   Special Requests POF VANCY AND ZINACEF  Final   Gram Stain   Final    FEW WBC PRESENT,BOTH PMN AND MONONUCLEAR NO ORGANISMS SEEN    Culture PENDING  Incomplete   Report Status PENDING  Incomplete  Aerobic/Anaerobic Culture(surg specimen)     Status: None (Preliminary result)   Collection Time: 04/30/16 10:53 AM  Result Value Ref Range Status   Specimen Description   Final    TISSUE PORTION OF MITRAL VALVE LEAFLET WITH VEGETATION SPEC B   Special Requests POF VANCY AND ZINACEF  Final   Gram Stain   Final    FEW WBC PRESENT, PREDOMINANTLY PMN NO ORGANISMS SEEN RESULT CALLED TO, READ BACK BY AND VERIFIED WITH: P WEATHERLY 04/30/16 @ 1153  M VESTAL    Culture PENDING  Incomplete   Report Status PENDING  Incomplete    Impression/Plan:  1. IE - has been off of antibiotics, repeated blood cultures have remained negative, cultures sent.  Will continue to monitor cultures.  2. CVA - from embolic stroke.    Dr Johnnye Sima available over the weekend if needed.

## 2016-05-01 NOTE — Progress Notes (Signed)
[address]       [place] [address]             [phone]        CARDIOTHORACIC SURGERY PROGRESS NOTE   R1 Day Post-Op Procedure(s) (LRB): MITRAL VALVE REPAIR  (N/A) TRANSESOPHAGEAL ECHOCARDIOGRAM (TEE) (N/A)  Subjective: Sedated on vent.  Has awoken and follows commands.  Not surprisingly, she gets agitated when more alert.  Objective: Vital signs: BP Readings from Last 1 Encounters:  05/01/16 82/56   Pulse Readings from Last 1 Encounters:  05/01/16 86   Resp Readings from Last 1 Encounters:  05/01/16 23   Temp Readings from Last 1 Encounters:  05/01/16 100.9 F (38.3 C)     Hemodynamics: PAP: (33-116)/(22-64) 36/25 mmHg CO:  [3.7 L/min-4.9 L/min] 3.9 L/min CI:  [1.7 L/min/m2-2.3 L/min/m2] 1.8 L/min/m2  Physical Exam:  Rhythm:   Sinus - AAI paced  Breath sounds: clear  Heart sounds:  RRR w/out murmur  Incisions:  Dressing dry, intact  Abdomen:  Soft, non-distended, non-tender  Extremities:  Warm, well-perfused  Chest tubes:  Low volume thin serosanguinous output, no air leak    Intake/Output from previous day: 06/08 0701 - 06/09 0700 In: 9254.2 [I.V.:5614.2; Blood:1750; NG/GT:90; IV Piggyback:1800] Out: HZ:[id number]; Emesis/NG output:20; Blood:1695; Chest Tube:329] Intake/Output this shift:    Lab Results:  CBC: Recent Labs  04/30/16 2144 05/01/16 0317  WBC 14.2* 15.0*  HGB 9.9* 9.9*  HCT 33.5* 33.2*  PLT 352 367    BMET:  Recent Labs  04/30/16 0430  04/30/16 2127 04/30/16 2208 05/01/16 0317  NA 136  < > 139  --  137  K 3.9  < > 4.1  --  4.7  CL 82*  < > 99*  --  100*  CO2 43*  --   --   --  31  GLUCOSE 117*  < > 127*  --  111*  BUN 30*  < > 24*  --  22*  CREATININE 0.90  < > 0.60 0.68 0.62  CALCIUM 11.5*  --   --   --  9.7  < > = values in this interval not displayed.   PT/INR:   Recent Labs  04/30/16 2144  LABPROT 15.5*  INR 1.21    CBG (last 3)   Recent Labs  04/30/16 2123  04/30/16 2235 04/30/16 2353  GLUCAP 120* 125* 109*    ABG    Component Value Date/Time   PHART 7.397 05/01/2016 0305   PCO2ART 51.7* 05/01/2016 0305   PO2ART 119* 05/01/2016 0305   HCO3 30.7* 05/01/2016 0305   TCO2 32.3 05/01/2016 0305   O2SAT 98.7 05/01/2016 0305    CXR: PORTABLE CHEST 1 VIEW  COMPARISON: 04/30/2016  FINDINGS: Endotracheal tube, enteric catheter, right Swan-Ganz catheter, mediastinal drain and bilateral chest tubes are stable in position.  Cardiomediastinal silhouette is enlarged.  There is no evidence of r pneumothorax. There is worsened aeration of the lungs with increased interstitial and airspace opacities most consistent with pulmonary edema. Small bilateral pleural effusions cannot be excluded.  Osseous structures are without acute abnormality. Soft tissues are grossly normal.  IMPRESSION: Worsening of mixed interstitial and alveolar pulmonary edema.  Small bilateral pleural effusions cannot be excluded.   Electronically Signed  By: Fidela Salisbury M.D.  On: 05/01/2016 07:46  Assessment/Plan: S/P Procedure(s) (LRB): MITRAL VALVE REPAIR  (N/A) TRANSESOPHAGEAL ECHOCARDIOGRAM (TEE) (N/A)  Overall doing fairly well POD1 Maintaining NSR w/ stable hemodynamics on low  dose milrinone and neo drips, nitric oxide weaning in progress - currently 16 ppm - PA pressures remarkably low O2 sats 100% on 40% FiO2 - CXR looks good with mild edema Acute on chronic diastolic CHF with expected post-op volume excess, weight reportedly nearly 30 lbs above preop baseline Expected post op acute blood loss anemia, mild, Hgb stable 9.9 Leukocytosis without clear signs of active infection - present preop - essentially unchanged Strep viridans endocarditis - pre-op follow up blood cultures off antibiotics negative and no signs of active infection at time of mitral valve repair Pulmonary hypertension Obesity hypoventilation syndrome Type II diabetes  mellitus, excellent glycemic control on insulin drip Morbid obesity 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

## 2016-05-02 ENCOUNTER — Inpatient Hospital Stay (HOSPITAL_COMMUNITY): Payer: Medicaid Other

## 2016-05-02 LAB — POCT I-STAT, CHEM 8
BUN: 21 mg/dL — ABNORMAL HIGH (ref 6–20)
Calcium, Ion: 1.42 mmol/L — ABNORMAL HIGH (ref 1.13–1.30)
Chloride: 100 mmol/L — ABNORMAL LOW (ref 101–111)
Creatinine, Ser: 0.7 mg/dL (ref 0.44–1.00)
Glucose, Bld: 133 mg/dL — ABNORMAL HIGH (ref 65–99)
HCT: 30 % — ABNORMAL LOW (ref 36.0–46.0)
Hemoglobin: 10.2 g/dL — ABNORMAL LOW (ref 12.0–15.0)
Potassium: 3.5 mmol/L (ref 3.5–5.1)
Sodium: 142 mmol/L (ref 135–145)
TCO2: 32 mmol/L (ref 0–100)

## 2016-05-02 LAB — GLUCOSE, CAPILLARY
Glucose-Capillary: 162 mg/dL — ABNORMAL HIGH (ref 65–99)
Glucose-Capillary: 127 mg/dL — ABNORMAL HIGH (ref 65–99)
Glucose-Capillary: 157 mg/dL — ABNORMAL HIGH (ref 65–99)
Glucose-Capillary: 137 mg/dL — ABNORMAL HIGH (ref 65–99)
Glucose-Capillary: 125 mg/dL — ABNORMAL HIGH (ref 65–99)
Glucose-Capillary: 133 mg/dL — ABNORMAL HIGH (ref 65–99)

## 2016-05-02 LAB — URINE CULTURE
Culture: NO GROWTH
Special Requests: NORMAL

## 2016-05-02 LAB — BASIC METABOLIC PANEL
Anion gap: 6 (ref 5–15)
BUN: 23 mg/dL — AB (ref 6–20)
CHLORIDE: 100 mmol/L — AB (ref 101–111)
CO2: 33 mmol/L — ABNORMAL HIGH (ref 22–32)
Calcium: 10.4 mg/dL — ABNORMAL HIGH (ref 8.9–10.3)
Creatinine, Ser: 0.81 mg/dL (ref 0.44–1.00)
Glucose, Bld: 142 mg/dL — ABNORMAL HIGH (ref 65–99)
POTASSIUM: 4.4 mmol/L (ref 3.5–5.1)
SODIUM: 139 mmol/L (ref 135–145)

## 2016-05-02 LAB — CARBOXYHEMOGLOBIN
Carboxyhemoglobin: 1.5 % (ref 0.5–1.5)
Methemoglobin: 0.6 % (ref 0.0–1.5)
O2 Saturation: 60.4 %
TOTAL HEMOGLOBIN: 15.2 g/dL (ref 12.0–16.0)

## 2016-05-02 LAB — CBC
HEMATOCRIT: 30.6 % — AB (ref 36.0–46.0)
Hemoglobin: 8.9 g/dL — ABNORMAL LOW (ref 12.0–15.0)
MCH: 25.4 pg — ABNORMAL LOW (ref 26.0–34.0)
MCHC: 29.1 g/dL — ABNORMAL LOW (ref 30.0–36.0)
MCV: 87.4 fL (ref 78.0–100.0)
PLATELETS: 328 10*3/uL (ref 150–400)
RBC: 3.5 MIL/uL — AB (ref 3.87–5.11)
RDW: 16.4 % — AB (ref 11.5–15.5)
WBC: 18.3 10*3/uL — AB (ref 4.0–10.5)

## 2016-05-02 MED ORDER — ENOXAPARIN SODIUM 40 MG/0.4ML ~~LOC~~ SOLN
40.0000 mg | SUBCUTANEOUS | Status: DC
  Administered 2016-05-02 – 2016-05-11 (×10): 40 mg via SUBCUTANEOUS
  Filled 2016-05-02 (×11): qty 0.4

## 2016-05-02 MED ORDER — CEFTAZIDIME 1 G IJ SOLR
1.0000 g | Freq: Three times a day (TID) | INTRAMUSCULAR | Status: AC
Start: 2016-05-02 — End: 2016-05-08
  Administered 2016-05-02 – 2016-05-08 (×20): 1 g via INTRAVENOUS
  Filled 2016-05-02 (×22): qty 1

## 2016-05-02 MED ORDER — FAMOTIDINE IN NACL 20-0.9 MG/50ML-% IV SOLN
20.0000 mg | Freq: Two times a day (BID) | INTRAVENOUS | Status: DC
  Administered 2016-05-02 – 2016-05-10 (×16): 20 mg via INTRAVENOUS
  Filled 2016-05-02 (×16): qty 50

## 2016-05-02 MED ORDER — HALOPERIDOL LACTATE 5 MG/ML IJ SOLN
2.0000 mg | Freq: Four times a day (QID) | INTRAMUSCULAR | Status: DC | PRN
  Administered 2016-05-02: 4 mg via INTRAVENOUS
  Administered 2016-05-02: 2 mg via INTRAVENOUS
  Administered 2016-05-03 – 2016-05-04 (×5): 4 mg via INTRAVENOUS
  Filled 2016-05-02 (×7): qty 1

## 2016-05-02 MED ORDER — SODIUM CHLORIDE 0.9 % IV SOLN
INTRAVENOUS | Status: DC
  Administered 2016-05-05: 50 mL via INTRAVENOUS

## 2016-05-02 MED ORDER — MIDAZOLAM HCL 2 MG/2ML IJ SOLN
2.0000 mg | INTRAMUSCULAR | Status: DC | PRN
Start: 2016-05-02 — End: 2016-05-05
  Administered 2016-05-02 – 2016-05-04 (×7): 2 mg via INTRAVENOUS
  Filled 2016-05-02 (×8): qty 2

## 2016-05-02 MED ORDER — MORPHINE SULFATE (PF) 4 MG/ML IV SOLN
4.0000 mg | INTRAVENOUS | Status: DC | PRN
  Administered 2016-05-02 – 2016-05-04 (×12): 4 mg via INTRAVENOUS
  Filled 2016-05-02 (×12): qty 1

## 2016-05-02 MED ORDER — WARFARIN - PHYSICIAN DOSING INPATIENT
Freq: Every day | Status: DC
  Administered 2016-05-02: 1
  Administered 2016-05-03 – 2016-05-10 (×5)

## 2016-05-02 MED ORDER — POTASSIUM CHLORIDE 10 MEQ/50ML IV SOLN
10.0000 meq | INTRAVENOUS | Status: AC
  Administered 2016-05-02 (×3): 10 meq via INTRAVENOUS
  Filled 2016-05-02 (×3): qty 50

## 2016-05-02 MED ORDER — METOPROLOL TARTRATE 5 MG/5ML IV SOLN
5.0000 mg | Freq: Three times a day (TID) | INTRAVENOUS | Status: DC
  Administered 2016-05-02 – 2016-05-04 (×6): 5 mg via INTRAVENOUS
  Filled 2016-05-02 (×9): qty 5

## 2016-05-02 MED ORDER — LEVALBUTEROL HCL 1.25 MG/0.5ML IN NEBU: 1.2500 mg | INHALATION_SOLUTION | Freq: Four times a day (QID) | RESPIRATORY_TRACT | Status: DC | PRN

## 2016-05-02 MED ORDER — DEXTROSE 5 % IV SOLN
6.0000 mg/h | INTRAVENOUS | Status: DC
  Administered 2016-05-02 – 2016-05-04 (×2): 6 mg/h via INTRAVENOUS
  Filled 2016-05-02 (×3): qty 25

## 2016-05-02 MED ORDER — DEXMEDETOMIDINE HCL IN NACL 400 MCG/100ML IV SOLN
0.2000 ug/kg/h | INTRAVENOUS | Status: DC
  Filled 2016-05-02: qty 100

## 2016-05-02 MED ORDER — WARFARIN SODIUM 2 MG PO TABS
2.0000 mg | ORAL_TABLET | Freq: Every day | ORAL | Status: DC
  Administered 2016-05-03: 2 mg via ORAL
  Filled 2016-05-02 (×3): qty 1

## 2016-05-02 NOTE — Progress Notes (Signed)
Dr. Prescott Gum notified with ABG results. Orders received for BIPAP and prn Haldol. Will continue to monitor closely.  Eleonore Chiquito RN 2 [place]

## 2016-05-02 NOTE — Progress Notes (Signed)
RT NOTE:  Pt placed on BIPAP following ABG. RN called results to MD and BIPAP ordered. Pt not tolerating well at this time. RN aware and preparing to administer Haldol. RT will draw post ABG when pt settles down to allow therapy to work. RT will monitor.

## 2016-05-02 NOTE — Discharge Instructions (Addendum)
1. Please obtain vital signs at least one time daily 2. Please weigh the patient daily. If he or she continues to gain weight or develops lower extremity edema, contact the office at [phone]. 3. Ambulate patient at least three times daily and please use sternal precautions. 4. Patient is on Coumadin, will need PT/INR monitored with goal INR range 2.5-3.5    Information on my medicine - Coumadin   (Warfarin)  This medication education was reviewed with me or my healthcare representative as part of my discharge preparation.  The pharmacist that spoke with me during my hospital stay was:  Romona Curls, [facility]  Why was Coumadin prescribed for you? Coumadin was prescribed for you because you have a blood clot or a medical condition that can cause an increased risk of forming blood clots. Blood clots can cause serious health problems by blocking the flow of blood to the heart, lung, or brain. Coumadin can prevent harmful blood clots from forming. As a reminder your indication for Coumadin is:   Blood Clot Prevention After Heart Valve Surgery  What test will check on my response to Coumadin? While on Coumadin (warfarin) you will need to have an INR test regularly to ensure that your dose is keeping you in the desired range. The INR (international normalized ratio) number is calculated from the result of the laboratory test called prothrombin time (PT).  If an INR APPOINTMENT HAS NOT ALREADY BEEN MADE FOR YOU please schedule an appointment to have this lab work done by your health care provider within 7 days. Your INR goal is usually a number between:  2 to 3 or your provider may give you a more narrow range like 2-2.5.  Ask your health care provider during an office visit what your goal INR is.  What  do you need to  know  About  COUMADIN? Take Coumadin (warfarin) exactly as prescribed by your healthcare provider about the same time each day.  DO NOT stop taking without talking to the doctor  who prescribed the medication.  Stopping without other blood clot prevention medication to take the place of Coumadin may increase your risk of developing a new clot or stroke.  Get refills before you run out.  What do you do if you miss a dose? If you miss a dose, take it as soon as you remember on the same day then continue your regularly scheduled regimen the next day.  Do not take two doses of Coumadin at the same time.  Important Safety Information A possible side effect of Coumadin (Warfarin) is an increased risk of bleeding. You should call your healthcare provider right away if you experience any of the following: ? Bleeding from an injury or your nose that does not stop. ? Unusual colored urine (red or dark brown) or unusual colored stools (red or black). ? Unusual bruising for unknown reasons. ? A serious fall or if you hit your head (even if there is no bleeding).  Some foods or medicines interact with Coumadin (warfarin) and might alter your response to warfarin. To help avoid this: ? Eat a balanced diet, maintaining a consistent amount of Vitamin K. ? Notify your provider about major diet changes you plan to make. ? Avoid alcohol or limit your intake to 1 drink for women and 2 drinks for men per day. (1 drink is 5 oz. wine, 12 oz. beer, or 1.5 oz. liquor.)  Make sure that ANY health care provider who prescribes medication for  you knows that you are taking Coumadin (warfarin).  Also make sure the healthcare provider who is monitoring your Coumadin knows when you have started a new medication including herbals and non-prescription products.  Coumadin (Warfarin)  Major Drug Interactions  Increased Warfarin Effect Decreased Warfarin Effect  Alcohol (large quantities) Antibiotics (esp. Septra/Bactrim, Flagyl, Cipro) Amiodarone (Cordarone) Aspirin (ASA) Cimetidine (Tagamet) Megestrol (Megace) NSAIDs (ibuprofen, naproxen, etc.) Piroxicam (Feldene) Propafenone (Rythmol  SR) Propranolol (Inderal) Isoniazid (INH) Posaconazole (Noxafil) Barbiturates (Phenobarbital) Carbamazepine (Tegretol) Chlordiazepoxide (Librium) Cholestyramine (Questran) Griseofulvin Oral Contraceptives Rifampin Sucralfate (Carafate) Vitamin K   Coumadin (Warfarin) Major Herbal Interactions  Increased Warfarin Effect Decreased Warfarin Effect  Garlic Ginseng Ginkgo biloba Coenzyme Q10 Green tea St. Johns wort    Coumadin (Warfarin) FOOD Interactions  Eat a consistent number of servings per week of foods HIGH in Vitamin K (1 serving =  cup)  Collards (cooked, or boiled & drained) Kale (cooked, or boiled & drained) Mustard greens (cooked, or boiled & drained) Parsley *serving size only =  cup Spinach (cooked, or boiled & drained) Swiss chard (cooked, or boiled & drained) Turnip greens (cooked, or boiled & drained)  Eat a consistent number of servings per week of foods MEDIUM-HIGH in Vitamin K (1 serving = 1 cup)  Asparagus (cooked, or boiled & drained) Broccoli (cooked, boiled & drained, or raw & chopped) Brussel sprouts (cooked, or boiled & drained) *serving size only =  cup Lettuce, raw (green leaf, endive, romaine) Spinach, raw Turnip greens, raw & chopped   These websites have more information on Coumadin (warfarin):  FailFactory.se; VeganReport.com.au;   Mitral Valve Repair, Care After Refer to this sheet in the next few weeks. These instructions provide you with information on caring for yourself after your procedure. Your health care provider may also give you specific instructions. Your treatment has been planned according to current medical practices, but problems sometimes occur. Call your health care provider if you have any problems or questions after your procedure.  HOME CARE INSTRUCTIONS   Take medicines only as directed by your health care provider.  Take your temperature every morning for the first 7 days after surgery. Write  these down.  Weigh yourself every morning for at least 7 days after surgery. Write your weight down.  Wear elastic stockings during the day for at least 2 weeks after surgery or as directed by your health care provider. Use them longer if your ankles are swollen. The stockings help blood flow and help reduce swelling in the legs.  Take frequent naps or rest often throughout the day.  Avoid lifting more than 10 lb (4.5 kg) or pushing or pulling things with your arms for 6-8 weeks or as directed by your health care provider.  Avoid driving or airplane travel for 4-6 weeks after surgery or as directed. If you are riding in a car for an extended period, stop every 1-2 hours to stretch your legs.  Avoid crossing your legs.  Avoid climbing stairs and using the handrail to pull yourself up for the first 2-3 weeks after surgery.  Do not take baths for 2-4 weeks after surgery. Take showers once your health care provider approves. Pat incisions dry. Do not rub incisions with a washcloth or towel.  Return to work as directed by your health care provider.  Drink enough fluids to keep your urine clear or pale yellow.  Do not strain to have a bowel movement. Eat high-fiber foods if you become constipated. You may also take a medicine  to help you have a bowel movement (laxative) as directed by your health care provider.  Resume sexual activity as directed by your health care provider. SEEK MEDICAL CARE IF:   You develop a skin rash.   Your weight is increasing each day over 2-3 days.  Your weight increases by 2 or more pounds (1 kg) in a single day.  You have a fever. SEEK IMMEDIATE MEDICAL CARE IF:   You develop chest pain that is not coming from your incision.  You develop shortness of breath or difficulty breathing.  You have drainage, redness, swelling, or pain at your incision site.  You have pus coming from your incision.  You develop light-headedness. MAKE SURE  YOU:  Understand these directions.  Will watch your condition.  Will get help right away if you are not doing well or get worse.   This information is not intended to replace advice given to you by your health care provider. Make sure you discuss any questions you have with your health care provider.   Document Released: 05/29/2005 Document Revised: 11/30/2014 Document Reviewed: 04/11/2013 Elsevier Interactive Patient Education 2016 [place] on my medicine - Coumadin   (Warfarin)  This medication education was reviewed with me or my healthcare representative as part of my discharge preparation.  The pharmacist that spoke with me during my hospital stay was:  Georgina Peer, [facility]  Why was Coumadin prescribed for you? Coumadin was prescribed for you because you have a blood clot or a medical condition that can cause an increased risk of forming blood clots. Blood clots can cause serious health problems by blocking the flow of blood to the heart, lung, or brain. Coumadin can prevent harmful blood clots from forming. As a reminder your indication for Coumadin is:   Blood Clot Prevention After Heart Valve Surgery  What test will check on my response to Coumadin? While on Coumadin (warfarin) you will need to have an INR test regularly to ensure that your dose is keeping you in the desired range. The INR (international normalized ratio) number is calculated from the result of the laboratory test called prothrombin time (PT).  If an INR APPOINTMENT HAS NOT ALREADY BEEN MADE FOR YOU please schedule an appointment to have this lab work done by your health care provider within 7 days. Your INR goal is usually a number between:  2 to 3 or your provider may give you a more narrow range like 2-2.5.  Ask your health care provider during an office visit what your goal INR is.  What  do you need to  know  About  COUMADIN? Take Coumadin (warfarin) exactly as prescribed by your  healthcare provider about the same time each day.  DO NOT stop taking without talking to the doctor who prescribed the medication.  Stopping without other blood clot prevention medication to take the place of Coumadin may increase your risk of developing a new clot or stroke.  Get refills before you run out.  What do you do if you miss a dose? If you miss a dose, take it as soon as you remember on the same day then continue your regularly scheduled regimen the next day.  Do not take two doses of Coumadin at the same time.  Important Safety Information A possible side effect of Coumadin (Warfarin) is an increased risk of bleeding. You should call your healthcare provider right away if you experience any of the following: ? Bleeding from an injury  or your nose that does not stop. ? Unusual colored urine (red or dark brown) or unusual colored stools (red or black). ? Unusual bruising for unknown reasons. ? A serious fall or if you hit your head (even if there is no bleeding).  Some foods or medicines interact with Coumadin (warfarin) and might alter your response to warfarin. To help avoid this: ? Eat a balanced diet, maintaining a consistent amount of Vitamin K. ? Notify your provider about major diet changes you plan to make. ? Avoid alcohol or limit your intake to 1 drink for women and 2 drinks for men per day. (1 drink is 5 oz. wine, 12 oz. beer, or 1.5 oz. liquor.)  Make sure that ANY health care provider who prescribes medication for you knows that you are taking Coumadin (warfarin).  Also make sure the healthcare provider who is monitoring your Coumadin knows when you have started a new medication including herbals and non-prescription products.  Coumadin (Warfarin)  Major Drug Interactions  Increased Warfarin Effect Decreased Warfarin Effect  Alcohol (large quantities) Antibiotics (esp. Septra/Bactrim, Flagyl, Cipro) Amiodarone (Cordarone) Aspirin (ASA) Cimetidine  (Tagamet) Megestrol (Megace) NSAIDs (ibuprofen, naproxen, etc.) Piroxicam (Feldene) Propafenone (Rythmol SR) Propranolol (Inderal) Isoniazid (INH) Posaconazole (Noxafil) Barbiturates (Phenobarbital) Carbamazepine (Tegretol) Chlordiazepoxide (Librium) Cholestyramine (Questran) Griseofulvin Oral Contraceptives Rifampin Sucralfate (Carafate) Vitamin K   Coumadin (Warfarin) Major Herbal Interactions  Increased Warfarin Effect Decreased Warfarin Effect  Garlic Ginseng Ginkgo biloba Coenzyme Q10 Green tea St. Johns wort    Coumadin (Warfarin) FOOD Interactions  Eat a consistent number of servings per week of foods HIGH in Vitamin K (1 serving =  cup)  Collards (cooked, or boiled & drained) Kale (cooked, or boiled & drained) Mustard greens (cooked, or boiled & drained) Parsley *serving size only =  cup Spinach (cooked, or boiled & drained) Swiss chard (cooked, or boiled & drained) Turnip greens (cooked, or boiled & drained)  Eat a consistent number of servings per week of foods MEDIUM-HIGH in Vitamin K (1 serving = 1 cup)  Asparagus (cooked, or boiled & drained) Broccoli (cooked, boiled & drained, or raw & chopped) Brussel sprouts (cooked, or boiled & drained) *serving size only =  cup Lettuce, raw (green leaf, endive, romaine) Spinach, raw Turnip greens, raw & chopped   These websites have more information on Coumadin (warfarin):  FailFactory.se; VeganReport.com.au;

## 2016-05-02 NOTE — Progress Notes (Signed)
RT NOTE:  BIPAP available @ bedside. RT and RN will monitor patient.

## 2016-05-02 NOTE — Progress Notes (Addendum)
RT NOTE:  Pt continues to talk with mask on causing seal to break and leak to increase to 70-80 mls. Pt will answer your questions and when told not to speak to allow therapy to work she replies "OK". ABG not drawn. It is unlikely the result will be accurate with patient continuous talking and moving. RT will report over to dayshift RT and attempt ABG at later time.

## 2016-05-02 NOTE — Progress Notes (Signed)
CT surgery p.m. Rounds  Patient more responsive but still agitated Patient was daily once out of bed She was taken off BiPAP because of better minute ventilation P.m. labs reviewed, responding well to IV Lasix and potassium supplemented Continue current care

## 2016-05-02 NOTE — Progress Notes (Signed)
2 Days Post-Op Procedure(s) (LRB): MITRAL VALVE REPAIR  (N/A) TRANSESOPHAGEAL ECHOCARDIOGRAM (TEE) (N/A) Subjective: Remains with delerium,  req restraints Bipap last pm for PCO2 > 60 Hope to mobilize to chair today- stop Bipap BP better today-- increase lasix drip Objective: Vital signs in last 24 hours: Temp:  [97.5 F (36.4 C)-100.2 F (37.9 C)] 97.5 F (36.4 C) (06/10 0900) Pulse Rate:  [67-171] 86 (06/10 0843) Cardiac Rhythm:  [-] Atrial paced (06/10 0400) Resp:  [19-41] 31 (06/10 0843) BP: (83-128)/(52-92) 84/57 mmHg (06/10 0843) SpO2:  [85 %-100 %] 100 % (06/10 0843) FiO2 (%):  [40 %-60 %] 40 % (06/10 0843) Weight:  [263 lb 0.1 oz (119.3 kg)] 263 lb 0.1 oz (119.3 kg) (06/10 0500)  Hemodynamic parameters for last 24 hours: PAP: (52-68)/(26-43) 52/26 mmHg CO:  [4.5 L/min-5.2 L/min] 5.2 L/min CI:  [2.1 L/min/m2-2.5 L/min/m2] 2.5 L/min/m2  Intake/Output from previous day: 06/09 0701 - 06/10 0700 In: 1153.8 [I.V.:973.8; NG/GT:30; IV Piggyback:150] Out: 1955 [Urine:1425; Chest Tube:530] Intake/Output this shift: Total I/O In: -  Out: 200 [Urine:200]       Exam    General- alert and comfortable   Lungs- clear without rales, wheezes   Cor- regular rate and rhythm, no murmur , gallop   Abdomen- soft, non-tender   Extremities - warm, non-tender, minimal edema   Neuro- oriented, appropriate, no focal weakness   Lab Results:  Recent Labs  05/01/16 1755 05/02/16 0340  WBC 18.9* 18.3*  HGB 9.6* 8.9*  HCT 32.6* 30.6*  PLT 327 328   BMET:  Recent Labs  05/01/16 0317 05/01/16 1752 05/01/16 1755 05/02/16 0340  NA 137 139  --  139  K 4.7 4.7  --  4.4  CL 100* 97*  --  100*  CO2 31  --   --  33*  GLUCOSE 111* 169*  --  142*  BUN 22* 26*  --  23*  CREATININE 0.62 0.90 0.97 0.81  CALCIUM 9.7  --   --  10.4*    PT/INR:  Recent Labs  04/30/16 2144  LABPROT 15.5*  INR 1.21   ABG    Component Value Date/Time   PHART 7.343* 05/02/2016 0345   HCO3 31.9*  05/02/2016 0345   TCO2 33.7 05/02/2016 0345   O2SAT 60.4 05/02/2016 0415   CBG (last 3)   Recent Labs  05/01/16 2028 05/01/16 2314 05/02/16 0349  GLUCAP 172* 162* 127*    Assessment/Plan: S/P Procedure(s) (LRB): MITRAL VALVE REPAIR  (N/A) TRANSESOPHAGEAL ECHOCARDIOGRAM (TEE) (N/A) Mobilize Diuresis Diabetes control will need swallow study - not safe for po intake at present   LOS: 18 days    Margaret [name] 05/02/2016

## 2016-05-03 ENCOUNTER — Inpatient Hospital Stay (HOSPITAL_COMMUNITY): Payer: Medicaid Other

## 2016-05-03 LAB — GLUCOSE, CAPILLARY
Glucose-Capillary: 118 mg/dL — ABNORMAL HIGH (ref 65–99)
Glucose-Capillary: 111 mg/dL — ABNORMAL HIGH (ref 65–99)
Glucose-Capillary: 102 mg/dL — ABNORMAL HIGH (ref 65–99)
Glucose-Capillary: 109 mg/dL — ABNORMAL HIGH (ref 65–99)
Glucose-Capillary: 95 mg/dL (ref 65–99)
Glucose-Capillary: 94 mg/dL (ref 65–99)

## 2016-05-03 LAB — COMPREHENSIVE METABOLIC PANEL
ALT: 13 U/L — ABNORMAL LOW (ref 14–54)
AST: 20 U/L (ref 15–41)
Albumin: 2.6 g/dL — ABNORMAL LOW (ref 3.5–5.0)
Alkaline Phosphatase: 55 U/L (ref 38–126)
Anion gap: 7 (ref 5–15)
BUN: 18 mg/dL (ref 6–20)
CO2: 34 mmol/L — ABNORMAL HIGH (ref 22–32)
Calcium: 10.6 mg/dL — ABNORMAL HIGH (ref 8.9–10.3)
Chloride: 102 mmol/L (ref 101–111)
Creatinine, Ser: 0.71 mg/dL (ref 0.44–1.00)
GFR calc Af Amer: >60 mL/min (ref >60)
GFR calc non Af Amer: >60 mL/min (ref >60)
Glucose, Bld: 113 mg/dL — ABNORMAL HIGH (ref 65–99)
Potassium: 3.5 mmol/L (ref 3.5–5.1)
Sodium: 143 mmol/L (ref 135–145)
Total Bilirubin: 0.7 mg/dL (ref 0.3–1.2)
Total Protein: 5.9 g/dL — ABNORMAL LOW (ref 6.5–8.1)

## 2016-05-03 LAB — TYPE AND SCREEN
ABO/RH(D): A NEG
Antibody Screen: NEGATIVE
UNIT DIVISION: 0
UNIT DIVISION: 0
Unit division: 0

## 2016-05-03 LAB — CARBOXYHEMOGLOBIN
Carboxyhemoglobin: 1.3 % (ref 0.5–1.5)
Methemoglobin: 0.9 % (ref 0.0–1.5)
O2 SAT: 69.6 %
TOTAL HEMOGLOBIN: 9.3 g/dL — AB (ref 12.0–16.0)

## 2016-05-03 LAB — CBC
HCT: 29.6 % — ABNORMAL LOW (ref 36.0–46.0)
Hemoglobin: 8.7 g/dL — ABNORMAL LOW (ref 12.0–15.0)
MCH: 26 pg (ref 26.0–34.0)
MCHC: 29.4 g/dL — ABNORMAL LOW (ref 30.0–36.0)
MCV: 88.6 fL (ref 78.0–100.0)
Platelets: 321 10*3/uL (ref 150–400)
RBC: 3.34 MIL/uL — ABNORMAL LOW (ref 3.87–5.11)
RDW: 16.6 % — ABNORMAL HIGH (ref 11.5–15.5)
WBC: 19.4 10*3/uL — ABNORMAL HIGH (ref 4.0–10.5)

## 2016-05-03 LAB — POCT I-STAT, CHEM 8
BUN: 20 mg/dL (ref 6–20)
Calcium, Ion: 1.48 mmol/L — ABNORMAL HIGH (ref 1.13–1.30)
Chloride: 98 mmol/L — ABNORMAL LOW (ref 101–111)
Creatinine, Ser: 0.8 mg/dL (ref 0.44–1.00)
Glucose, Bld: 101 mg/dL — ABNORMAL HIGH (ref 65–99)
HCT: 32 % — ABNORMAL LOW (ref 36.0–46.0)
Hemoglobin: 10.9 g/dL — ABNORMAL LOW (ref 12.0–15.0)
Potassium: 3.7 mmol/L (ref 3.5–5.1)
Sodium: 141 mmol/L (ref 135–145)
TCO2: 35 mmol/L (ref 0–100)

## 2016-05-03 LAB — PROTIME-INR
INR: 1.37 (ref 0.00–1.49)
PROTHROMBIN TIME: 17 s — AB (ref 11.6–15.2)

## 2016-05-03 MED ORDER — POTASSIUM CHLORIDE 10 MEQ/50ML IV SOLN
10.0000 meq | INTRAVENOUS | Status: AC
  Administered 2016-05-03 (×3): 10 meq via INTRAVENOUS
  Filled 2016-05-03 (×3): qty 50

## 2016-05-03 NOTE — Progress Notes (Signed)
3 Days Post-Op Procedure(s) (LRB): MITRAL VALVE REPAIR  (N/A) TRANSESOPHAGEAL ECHOCARDIOGRAM (TEE) (N/A) Subjective: Stable hemodynamics off milrinone, co - ox .70 nsr  78/min Postop delerium  - starting to verbalize but still restrained Needs swallow eval before starting diet On Fortaz for poss pneumonia- wbc 19 w/ temp 99.3  Objective: Vital signs in last 24 hours: Temp:  [97.4 F (36.3 C)-99.1 F (37.3 C)] 97.4 F (36.3 C) (06/11 0800) Pulse Rate:  [38-93] 85 (06/11 0800) Cardiac Rhythm:  [-] Atrial paced (06/11 0800) Resp:  [19-44] 19 (06/11 0800) BP: (86-193)/(50-108) 99/50 mmHg (06/11 0800) SpO2:  [96 %-100 %] 100 % (06/11 0800) Weight:  [255 lb 11.7 oz (116 kg)] 255 lb 11.7 oz (116 kg) (06/11 0500)  Hemodynamic parameters for last 24 hours:  stable  Intake/Output from previous day: 06/10 0701 - 06/11 0700 In: 916.5 [I.V.:616.5; IV Piggyback:300] Out: 2390 [Urine:2250; Chest Tube:140] Intake/Output this shift: Total I/O In: 96 [I.V.:46; IV Piggyback:50] Out: 0   Coarse breath sounds Mild edema Weak L side is baseline Lab Results:  Recent Labs  05/02/16 0340 05/02/16 1457 05/03/16 0415  WBC 18.3*  --  19.4*  HGB 8.9* 10.2* 8.7*  HCT 30.6* 30.0* 29.6*  PLT 328  --  321   BMET:  Recent Labs  05/02/16 0340 05/02/16 1457 05/03/16 0415  NA 139 142 143  K 4.4 3.5 3.5  CL 100* 100* 102  CO2 33*  --  34*  GLUCOSE 142* 133* 113*  BUN 23* 21* 18  CREATININE 0.81 0.70 0.71  CALCIUM 10.4*  --  10.6*    PT/INR:  Recent Labs  05/03/16 0415  LABPROT 17.0*  INR 1.37   ABG    Component Value Date/Time   PHART 7.343* 05/02/2016 0345   HCO3 31.9* 05/02/2016 0345   TCO2 32 05/02/2016 1457   O2SAT 69.6 05/03/2016 0423   CBG (last 3)   Recent Labs  05/02/16 2033 05/02/16 2309 05/03/16 0329  GLUCAP 133* 118* 111*    Assessment/Plan: S/P Procedure(s) (LRB): MITRAL VALVE REPAIR  (N/A) TRANSESOPHAGEAL ECHOCARDIOGRAM (TEE)  (N/A) Mobilize Diuresis Diabetes control d/c tubes/lines bedside SLT eval   LOS: 19 days    Margaret [name] 05/03/2016

## 2016-05-03 NOTE — Evaluation (Signed)
Clinical/Bedside Swallow Evaluation Patient Details  Name: Margaret [name] MRN: [id number] Date of Birth: 1956/07/10  Today's Date: 05/03/2016 Time: SLP Start Time (ACUTE ONLY): 1339 SLP Stop Time (ACUTE ONLY): 1356 SLP Time Calculation (min) (ACUTE ONLY): 17 min  Past Medical History:  Past Medical History  Diagnosis Date  . Hypertension   . Hyperlipidemia   . COPD (chronic obstructive pulmonary disease) ([place]) 04/20/12  . Asthma   . Type II diabetes mellitus ([place])   . Anemia   . H/O hiatal hernia   . Osteoarthritis   . Pneumonia 04/2012  . Migraines   . Panic attacks 04/20/12  . Morbid obesity ([place])   . Heart murmur   . Bacterial endocarditis     Strep viridans   . Chronic diastolic CHF (congestive heart failure) ([place]) 04/27/2012  . Pulmonary hypertension assoc with unclear multi-factorial mechanisms ([place]) 01/23/2016  . Positive ANA (antinuclear antibody)   . 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 Stroke ([place])   . Subarachnoid bleed ([place]) 02/01/2016  . S/P mitral valve repair 04/30/2016    Complex valvuloplasty including autologous pericardial patch repair of perforated posterior leaflet, artificial Gore-tex neochord placement x6 and 26 mm Sorin Memo 3D ring annuloplasty   Past Surgical History:  Past Surgical History  Procedure Laterality Date  . Laceration repair  1966    "right upper arm"  . Dilation and curettage of uterus  1990's  . Finger reattachment  1973    "right ring finger"  . Tee without cardioversion N/A 02/05/2016    Procedure: TRANSESOPHAGEAL ECHOCARDIOGRAM (TEE);  Surgeon: Larey Dresser, MD;  Location: [place];  Service: Cardiovascular;  Laterality: N/A;  . Radiology with anesthesia N/A 02/06/2016    Procedure: RADIOLOGY WITH ANESTHESIA;  Surgeon: Luanne Bras, MD;  Location: [place];  Service: Radiology;  Laterality: N/A;  . Cardiac catheterization N/A 04/23/2016    Procedure: Right/Left Heart Cath and Coronary Angiography;  Surgeon: Sherren Mocha, MD;   Location: [place] [facility];  Service: Cardiovascular;  Laterality: N/A;  . Mitral valve repair N/A 04/30/2016    Procedure: MITRAL VALVE REPAIR ;  Surgeon: Rexene Alberts, MD;  Location: [place];  Service: Open Heart Surgery;  Laterality: N/A;  . Tee without cardioversion N/A 04/30/2016    Procedure: TRANSESOPHAGEAL ECHOCARDIOGRAM (TEE);  Surgeon: Rexene Alberts, MD;  Location: [place];  Service: Open Heart Surgery;  Laterality: N/A;   HPI:  Margaret [name] is a 60 y.o. female with a hx of COPD, HFpEF (seen by Dr. Tamala Julian during hospital admission 7/13), OSA, HTN, HL, poorly controlled diabetes, tobacco abuse.   Admitted in 2/17 with splenic infarct and was placed on Xarelto for anticoagulation.   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 She was noted to have strep viridans bacteremia and her presentation was felt to be related to septic emboli. 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 She underwent endovascular thrombectomy by IR. It was felt that she would need to undergo rehabilitation with significant recovery before proceeding to mitral valve surgery. She was discharged to [facility].  The patient was seen by ST during the March admission and was recomended to be on a dysphagia 1 diet with thin liquids.    Assessment / Plan / Recommendation Clinical Impression  Clinical swallowing evaluation was completed.  The patient has generalized weakness.  Oral mechanism exam revealed left side facial, labial and lingual weakness that is most likely baseline from CVA in March.  Per nursing the patient was just given IV pain medication and she is currently lethargic but arouses easily to touch/verbal stimuli.  The patient had a swallow work up during admission for her CVA in March 2017 with  recommendation for a dypshagia 1 diet with thin liquids.  While attempts were made to advance her solids the patient preferred to stay on the puree diet.  Today the patient presents with oropharyngeal dypshagia characterized by delayed oral transit, delayed swallow trigger and decreased hyo-laryngeal excursion.  [name] alertness level waxed and waned and she required multilpe prompts to maintain alertness.  Delayed cough was noted given thin liquids.  Recommend keep the patient NPO except meds crushed in purees until her swallow can be reevaluated when she is more alert.  ST will follow up next date for PO readiness.       Aspiration Risk  Moderate aspiration risk    Diet Recommendation   NPO except meds crushed in puree  Medication Administration: Crushed with puree    Other  Recommendations Oral Care Recommendations: Oral care QID   Follow up Recommendations   (to be determined)    Frequency and Duration min 2x/week  2 weeks       Prognosis Prognosis for Safe Diet Advancement: Good      Swallow Study   General Date of Onset: 04/14/16 HPI: Margaret [name] is a 60 y.o. female with a hx of COPD, HFpEF (seen by Dr. Tamala Julian during hospital admission 7/13), OSA, HTN, HL, poorly controlled diabetes, tobacco abuse.   Admitted in 2/17 with splenic infarct and was placed on Xarelto for anticoagulation.   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 She was noted to have strep viridans bacteremia and her presentation was felt to be related to septic emboli. 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 She underwent endovascular thrombectomy by IR. It was felt that she would need to undergo rehabilitation with significant recovery before proceeding to mitral valve surgery. She was  discharged to [facility].  The patient was seen by ST during the March admission and was recomended to be on a dysphagia 1 diet with thin liquids.  Type of Study: Bedside Swallow Evaluation Previous Swallow Assessment: March 2017 with rx for Dyspahgia 1 wtih thin liquids.   Diet Prior to this Study: NPO Temperature Spikes Noted: Yes History of Recent Intubation: Yes Length of Intubations (days): 1 days Date extubated: 05/01/16 Behavior/Cognition: Cooperative;Requires cueing;Lethargic/Drowsy Oral Cavity Assessment: Within Functional Limits Oral Care Completed by SLP: No Self-Feeding Abilities: Total assist Patient Positioning: Upright in bed Baseline Vocal Quality: Normal Volitional Cough: Weak Volitional Swallow: Unable to elicit    Oral/Motor/Sensory Function Overall Oral Motor/Sensory Function: Moderate impairment Facial ROM: Reduced left Facial Symmetry: Abnormal symmetry left Facial Strength: Reduced left Lingual ROM: Within Functional Limits Lingual Symmetry: Abnormal symmetry left Lingual Strength: Reduced Mandible: Within Functional Limits   Ice Chips Ice chips: Impaired Presentation: Spoon Oral Phase Impairments: Impaired mastication Oral Phase Functional Implications: Prolonged oral transit Pharyngeal Phase Impairments: Suspected delayed Swallow   Thin Liquid Thin Liquid: Impaired Presentation: Spoon;Cup Oral Phase Functional Implications: Prolonged oral transit Pharyngeal  Phase Impairments: Suspected delayed Swallow;Cough - Delayed    Nectar Thick Nectar Thick  Liquid: Not tested   Honey Thick Honey Thick Liquid: Not tested   Puree Puree: Impaired Presentation: Spoon Oral Phase Impairments: Impaired mastication Oral Phase Functional Implications: Prolonged oral transit Pharyngeal Phase Impairments: Suspected delayed Swallow   Solid   GO   Solid: Not tested    Functional Assessment Tool Used: ASHA NOMS and clinical judgment.   Functional Limitations:  Swallowing Swallow Current Status [phone]): At least 80 percent but less than 100 percent impaired, limited or restricted Swallow Goal Status [phone]): At least 40 percent but less than 60 percent impaired, limited or restricted   Shelly Flatten, [place], [place]  Lamar Sprinkles 05/03/2016,2:06 PM

## 2016-05-03 NOTE — Progress Notes (Addendum)
Physical Therapy Treatment/Re-evaluation Patient Details Name: Margaret [name] MRN: [id number] DOB: 01-16-1956 Today's Date: 05/03/2016    History of Present Illness Margaret [name] is a 60 y.o. female admitted with acute  CHF exacerbation.  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 Working up for ?MVR. MVR on 04/30/16.      PT Comments    Pt admitted with above diagnosis. Pt currently with functional limitations due to sternal precautions and strength and mobility deficits.  Pt was able to stand pivot with +2 mod assist to chair.  Did better than expected.  Pt will need SNF with therapy prior to d/c home.  Supportive husband.  MD:  Please order OT consult as pt would benefit.  Thanks.  Goals revised as pt has had complications and surgery since goals were set.  Pt will benefit from skilled PT to increase their independence and safety with mobility to allow discharge to the venue listed below.    Follow Up Recommendations  SNF;Supervision/Assistance - 24 hour     Equipment Recommendations  Wheelchair (measurements PT);Wheelchair cushion (measurements PT);Hospital bed (hoyer lift)    Recommendations for Other Services       Precautions / Restrictions Precautions Precautions: Fall Precaution Comments: L hemiparesis Restrictions Weight Bearing Restrictions: Yes (Sternal precautions) LLE Weight Bearing: Weight bearing as tolerated    Mobility  Bed Mobility Overal bed mobility: Needs Assistance Bed Mobility: Supine to Sit     Supine to sit: Max assist;+2 for physical assistance;HOB elevated (helicopter method)     General bed mobility comments: max assist to elevate trunk and to manage Lt LE. Mod use of pad for positioning.  Took pt incr time to obtain balance  but once she did she sat with min guard assist.   Transfers Overall transfer level: Needs assistance Equipment used: 2 person hand held assist Transfers: Sit to/from [employer] Sit to Stand: Mod assist;+2 physical assistance;From elevated surface Stand pivot transfers: Mod assist;+2 physical assistance       General transfer comment: Pt needed mod assist to power up.  Once up, needed assist to facilitate pivotal steps to recliner with PT assisting pt with movement of right LE and blocking left knee as it wanted to extend.  Pt assisted with controlling descent into chair.  Total assist to scoot back in chair.    Ambulation/Gait                 Stairs            Wheelchair Mobility    Modified Rankin (Stroke Patients Only) Modified Rankin (Stroke Patients Only) Pre-Morbid Rankin Score: Severe disability Modified Rankin: Severe disability     Balance Overall balance assessment: Needs assistance Sitting-balance support: Single extremity supported;Feet supported Sitting balance-Leahy Scale: Fair Sitting balance - Comments: able to sit EOB x 10 min with minguard assist.    Standing balance support: Bilateral upper extremity supported;During functional activity Standing balance-Leahy Scale: Poor Standing balance comment: relies on UE support as well as support of Left LE with poor static standing balance overall.                     Cognition Arousal/Alertness: Lethargic Behavior During Therapy: WFL for tasks assessed/performed Overall Cognitive Status: History of cognitive impairments - at  baseline                      Exercises General Exercises - Lower Extremity Ankle Circles/Pumps: AROM;Both;10 reps;Supine Long Arc Quad: AROM;Both;5 reps;Seated    General Comments        Pertinent Vitals/Pain Pain Assessment: Faces Faces Pain Scale: Hurts even more Pain Location: all over Pain Descriptors / Indicators:  Grimacing;Guarding;Discomfort Pain Intervention(s): Limited activity within patient's tolerance;Monitored during session;RepositionedO2 on 4LO2 with sats 95-100%.     Home Living                      Prior Function            PT Goals (current goals can now be found in the care plan section) Acute Rehab PT Goals PT Goal Formulation: With patient Time For Goal Achievement: 05/17/16 Potential to Achieve Goals: Good Progress towards PT goals: Goals downgraded-see care plan    Frequency  Min 3X/week    PT Plan Current plan remains appropriate    Co-evaluation             End of Session Equipment Utilized During Treatment: Gait belt;Oxygen Activity Tolerance: Patient limited by fatigue Patient left: in chair;with call bell/phone within reach;with chair alarm set;with family/visitor present     Time: 5409-8119 PT Time Calculation (min) (ACUTE ONLY): 31 min  Charges:  $Therapeutic Activity: 8-22 mins                    G Codes:      Margaret [name], Alvis Lemmings F 2016-05-16, 1:35 PM [employer] Acute Rehabilitation [phone] (pager)

## 2016-05-04 ENCOUNTER — Inpatient Hospital Stay (HOSPITAL_COMMUNITY): Payer: Medicaid Other

## 2016-05-04 DIAGNOSIS — I4891 Unspecified atrial fibrillation: Secondary | ICD-10-CM

## 2016-05-04 LAB — BLOOD GAS, ARTERIAL
ACID-BASE EXCESS: 6.3 mmol/L — AB (ref 0.0–2.0)
BICARBONATE: 31.9 meq/L — AB (ref 20.0–24.0)
DRAWN BY: 364961
O2 Content: 6 L/min
O2 SAT: 98.1 %
PATIENT TEMPERATURE: 98.6
PO2 ART: 112 mmHg — AB (ref 80.0–100.0)
TCO2: 33.7 mmol/L (ref 0–100)
pCO2 arterial: 60.3 mmHg (ref 35.0–45.0)
pH, Arterial: 7.343 — ABNORMAL LOW (ref 7.350–7.450)

## 2016-05-04 LAB — COMPREHENSIVE METABOLIC PANEL
ALT: 15 U/L (ref 14–54)
AST: 16 U/L (ref 15–41)
Albumin: 2.8 g/dL — ABNORMAL LOW (ref 3.5–5.0)
Alkaline Phosphatase: 60 U/L (ref 38–126)
Anion gap: 7 (ref 5–15)
BUN: 15 mg/dL (ref 6–20)
CO2: 36 mmol/L — ABNORMAL HIGH (ref 22–32)
Calcium: 10.8 mg/dL — ABNORMAL HIGH (ref 8.9–10.3)
Chloride: 99 mmol/L — ABNORMAL LOW (ref 101–111)
Creatinine, Ser: 0.64 mg/dL (ref 0.44–1.00)
GFR calc Af Amer: >60 mL/min (ref >60)
GFR calc non Af Amer: >60 mL/min (ref >60)
Glucose, Bld: 80 mg/dL (ref 65–99)
Potassium: 3.6 mmol/L (ref 3.5–5.1)
Sodium: 142 mmol/L (ref 135–145)
Total Bilirubin: 0.6 mg/dL (ref 0.3–1.2)
Total Protein: 6.7 g/dL (ref 6.5–8.1)

## 2016-05-04 LAB — GLUCOSE, CAPILLARY
Glucose-Capillary: 71 mg/dL (ref 65–99)
Glucose-Capillary: 86 mg/dL (ref 65–99)
Glucose-Capillary: 86 mg/dL (ref 65–99)
Glucose-Capillary: 88 mg/dL (ref 65–99)
Glucose-Capillary: 93 mg/dL (ref 65–99)
Glucose-Capillary: 95 mg/dL (ref 65–99)

## 2016-05-04 LAB — CBC
HCT: 31.7 % — ABNORMAL LOW (ref 36.0–46.0)
Hemoglobin: 9 g/dL — ABNORMAL LOW (ref 12.0–15.0)
MCH: 25.3 pg — ABNORMAL LOW (ref 26.0–34.0)
MCHC: 28.4 g/dL — ABNORMAL LOW (ref 30.0–36.0)
MCV: 89 fL (ref 78.0–100.0)
Platelets: 399 10*3/uL (ref 150–400)
RBC: 3.56 MIL/uL — ABNORMAL LOW (ref 3.87–5.11)
RDW: 16.8 % — ABNORMAL HIGH (ref 11.5–15.5)
WBC: 19.7 10*3/uL — ABNORMAL HIGH (ref 4.0–10.5)

## 2016-05-04 LAB — PROTIME-INR
INR: 1.33 (ref 0.00–1.49)
PROTHROMBIN TIME: 16.6 s — AB (ref 11.6–15.2)

## 2016-05-04 MED ORDER — AMIODARONE HCL IN DEXTROSE 360-4.14 MG/200ML-% IV SOLN
60.0000 mg/h | INTRAVENOUS | Status: AC
  Administered 2016-05-04 (×2): 60 mg/h via INTRAVENOUS
  Filled 2016-05-04: qty 200

## 2016-05-04 MED ORDER — POTASSIUM CHLORIDE CRYS ER 10 MEQ PO TBCR
10.0000 meq | EXTENDED_RELEASE_TABLET | Freq: Two times a day (BID) | ORAL | Status: DC
  Administered 2016-05-04: 10 meq via ORAL
  Filled 2016-05-04 (×2): qty 1

## 2016-05-04 MED ORDER — NYSTATIN 100000 UNIT/GM EX POWD
Freq: Two times a day (BID) | CUTANEOUS | Status: DC
  Administered 2016-05-04 – 2016-05-12 (×17): via TOPICAL
  Filled 2016-05-04: qty 15

## 2016-05-04 MED ORDER — AMIODARONE LOAD VIA INFUSION
150.0000 mg | Freq: Once | INTRAVENOUS | Status: AC
  Administered 2016-05-04: 150 mg via INTRAVENOUS
  Filled 2016-05-04: qty 83.34

## 2016-05-04 MED ORDER — METOPROLOL TARTRATE 5 MG/5ML IV SOLN
5.0000 mg | Freq: Once | INTRAVENOUS | Status: AC
  Administered 2016-05-04: 5 mg via INTRAVENOUS

## 2016-05-04 MED ORDER — ASPIRIN EC 81 MG PO TBEC
81.0000 mg | DELAYED_RELEASE_TABLET | Freq: Every day | ORAL | Status: DC
  Administered 2016-05-05 – 2016-05-12 (×7): 81 mg via ORAL
  Filled 2016-05-04 (×8): qty 1

## 2016-05-04 MED ORDER — MORPHINE SULFATE (PF) 2 MG/ML IV SOLN
1.0000 mg | INTRAVENOUS | Status: DC | PRN
  Administered 2016-05-04 – 2016-05-06 (×6): 2 mg via INTRAVENOUS
  Filled 2016-05-04 (×6): qty 1

## 2016-05-04 MED ORDER — POTASSIUM CHLORIDE 10 MEQ/50ML IV SOLN
10.0000 meq | INTRAVENOUS | Status: AC
  Administered 2016-05-04 (×3): 10 meq via INTRAVENOUS
  Filled 2016-05-04 (×3): qty 50

## 2016-05-04 MED ORDER — AMIODARONE HCL IN DEXTROSE 360-4.14 MG/200ML-% IV SOLN
30.0000 mg/h | INTRAVENOUS | Status: AC
  Administered 2016-05-04 – 2016-05-06 (×6): 30 mg/h via INTRAVENOUS
  Filled 2016-05-04 (×5): qty 200

## 2016-05-04 MED ORDER — FUROSEMIDE 40 MG PO TABS
40.0000 mg | ORAL_TABLET | Freq: Two times a day (BID) | ORAL | Status: DC
  Filled 2016-05-04: qty 1

## 2016-05-04 MED ORDER — POTASSIUM CHLORIDE 10 MEQ/50ML IV SOLN
10.0000 meq | INTRAVENOUS | Status: AC
  Administered 2016-05-04 (×2): 10 meq via INTRAVENOUS
  Filled 2016-05-04 (×2): qty 50

## 2016-05-04 NOTE — Progress Notes (Signed)
Pt went into Afib RVR 170's - 190's. Dr. Prescott Gum paged and orders received to start amiodarone bolus/infusion, 5 mg IV metoprolol, and 5 runs IV K+. Will implement and continue to monitor closely.

## 2016-05-04 NOTE — Progress Notes (Signed)
Patient ID: Margaret [name], female   DOB: 09-09-1956, 60 y.o.   MRN: [id number]  SICU Evening Rounds:  She has been hemodynamically stable today but went back into atrial fib with RVR this afternoon. Just received IV amio bolus and has been on amio drip since this morning when she went into atrial fib for the first time postop.   Urine output ok on lasix drip.  Mental status unchanged today according to nurse.  sats 100%

## 2016-05-04 NOTE — Progress Notes (Signed)
SLP Cancellation Note  Patient Details Name: Margaret [name] MRN: [id number] DOB: 1956-07-06   Cancelled treatment:       Reason Eval/Treat Not Completed: Medical issues which prohibited therapy. RN reports pt poorly arousable. Receiving morphine for chest tube removal. Will follow.    [place], [place] Pager [phone] 05/04/2016, 9:20 AM

## 2016-05-04 NOTE — Progress Notes (Signed)
HR still staying up MD called by Charge nurse bolus given  Amiodarone  as ordered.

## 2016-05-04 NOTE — Progress Notes (Signed)
Physical Therapy Treatment Patient Details Name: Margaret [name] MRN: [id number] DOB: 1956-07-07 Today's Date: 05/04/2016    History of Present Illness Margaret [name] is a 60 y.o. female admitted with acute  CHF exacerbation.  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 Underwent MVR on 6/8.    PT Comments    Attempted to use Stedy for stand but pt with such poor sitting balance today unable to even attempt standing. Used maxisky to get to chair.  Follow Up Recommendations  SNF     Equipment Recommendations  Wheelchair (measurements PT);Wheelchair cushion (measurements PT);Hospital bed (hoyer lift)    Recommendations for Other Services       Precautions / Restrictions Precautions Precautions: Fall;Sternal Precaution Comments: L hemiparesis Restrictions Weight Bearing Restrictions: Yes (Sternal precautions) LLE Weight Bearing: Weight bearing as tolerated    Mobility  Bed Mobility Overal bed mobility: Needs Assistance Bed Mobility: Supine to Sit     Supine to sit: Max assist;+2 for physical assistance;HOB elevated Sit to supine: +2 for physical assistance;Total assist   General bed mobility comments: Assist for all aspects  Transfers                 General transfer comment: Brought Corene Cornea but unable to even get pt positioned for attempt due to very heavy lt lean sitting EOB.  Ambulation/Gait                 Stairs            Wheelchair Mobility    Modified Rankin (Stroke Patients Only) Modified Rankin (Stroke Patients Only) Pre-Morbid Rankin Score: Severe disability Modified Rankin: Severe disability     Balance Overall balance assessment: Needs assistance Sitting-balance support: Single extremity  supported;Feet supported Sitting balance-Leahy Scale: Zero Sitting balance - Comments: Max assist to sit EOB with very heavy lt lean.                             Cognition Arousal/Alertness: Lethargic Behavior During Therapy: WFL for tasks assessed/performed Overall Cognitive Status: No family/caregiver present to determine baseline cognitive functioning                      Exercises      General Comments        Pertinent Vitals/Pain Pain Assessment: Faces Faces Pain Scale: Hurts even more Pain Location: chest Pain Descriptors / Indicators: Grimacing Pain Intervention(s): Limited activity within patient's tolerance;Monitored during session;Repositioned    Home Living                      Prior Function            PT Goals (current goals can now be found in the care plan section) Progress towards PT goals: Not progressing toward goals - comment    Frequency  Min 3X/week    PT Plan Current plan remains appropriate    Co-evaluation             End of Session Equipment Utilized During Treatment: Oxygen Activity Tolerance: Patient limited by fatigue;Patient limited by lethargy Patient left: in chair;with call bell/phone within reach     Time: 1045-1059 PT Time Calculation (min) (ACUTE ONLY): 14 min  Charges:  $Therapeutic Activity: 8-22 mins                    G Codes:      Lowell Makara 05-10-2016, 12:33 PM [facility] PT [phone]

## 2016-05-04 NOTE — Progress Notes (Signed)
Patient very agitated and restless heart rate up BP up will give prn as needed

## 2016-05-04 NOTE — Progress Notes (Signed)
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  CURRENT MEDS . acetaminophen  1,000 mg Oral Q6H  . antiseptic oral rinse  7 mL Mouth Rinse QID  . aspirin EC  81 mg Oral Daily  . bisacodyl  10 mg Oral Daily   Or  . bisacodyl  10 mg Rectal Daily  . cefTAZidime (FORTAZ)  IV  1 g Intravenous Q8H  . clonazePAM  0.5 mg Oral BID  . docusate sodium  200 mg Oral Daily  . enoxaparin (LOVENOX) injection  40 mg Subcutaneous Q24H  . escitalopram  10 mg Oral QHS  . famotidine (PEPCID) IV  20 mg Intravenous Q12H  . insulin aspart  0-24 Units Subcutaneous Q4H  . metoprolol  5 mg Intravenous Q8H  . nystatin   Topical BID  . potassium chloride  10 mEq Intravenous Q1 Hr x 2  . sodium chloride flush  3 mL Intravenous Q12H  . warfarin  2 mg Oral q1800  . Warfarin - Physician Dosing Inpatient   Does not apply q1800    OBJECTIVE  Filed Vitals:   05/04/16 0742 05/04/16 0754 05/04/16 0800 05/04/16 0900  BP:   126/72 162/87  Pulse:  77 59 75  Temp: 97.7 F (36.5 C)     TempSrc: Oral     Resp:  26 21 23   Height:      Weight:      SpO2:  99% 99% 98%    Intake/Output Summary (Last 24 hours) at 05/04/16 1054 Last data filed at 05/04/16 1000  Gross per 24 hour  Intake 1898.37 ml  Output   2675 ml  Net -776.63 ml   Filed Weights   05/02/16 0500 05/03/16 0500 05/04/16 0345  Weight: 263 lb 0.1 oz (119.3 kg)  255 lb 11.7 oz (116 kg) 251 lb 5.2 oz (114 kg)    PHYSICAL EXAM  General: somnolent Neuro: Moves all extremities spontaneously. HEENT:  Normal  Neck: Supple without bruits or JVD. Lungs:  Resp regular and unlabored, rales at both bases Heart: RRR no s3, s4, or murmurs. Abdomen: Soft, non-tender, non-distended, BS + x 4.  Extremities: No clubbing, cyanosis or edema. DP/PT/Radials 2+ and equal bilaterally.  Accessory Clinical Findings  CBC  Recent Labs  05/03/16 0415 05/03/16 1651 05/04/16 0340  WBC 19.4*  --  19.7*  HGB 8.7* 10.9* 9.0*  HCT 29.6* 32.0* 31.7*  MCV 88.6  --  89.0  PLT 321  --  [id number]   Basic Metabolic Panel  Recent Labs  05/01/16 1755  05/03/16 0415 05/03/16 1651 05/04/16 0340  NA  --   < > 143 141 142  K  --   < > 3.5 3.7 3.6  CL  --   < > 102 98* 99*  CO2  --   < > 34*  --  36*  GLUCOSE  --   < >  113* 101* 80  BUN  --   < > 18 20 15   CREATININE 0.97  < > 0.71 0.80 0.64  CALCIUM  --   < > 10.6*  --  10.8*  MG 1.9  --   --   --   --   < > = values in this interval not displayed. Liver Function Tests  Recent Labs  05/03/16 0415 05/04/16 0340  AST 20 16  ALT 13* 15  ALKPHOS 55 60  BILITOT 0.7 0.6  PROT 5.9* 6.7  ALBUMIN 2.6* 2.8*   Radiology/Studies  Dg Chest Port 1 View  05/04/2016  CLINICAL DATA:  Shortness of breath.  Chest tube placement. EXAM: PORTABLE CHEST 1 VIEW COMPARISON:  05/03/2016. FINDINGS: Right IJ line in stable position. Interim removal of right chest tube. Left chest tube in stable position. No pneumothorax. Prior CABG with bilateral diffuse pulmonary infiltrates consistent with persistent pulmonary edema, slightly increased from prior exam. Small pleural effusions cannot be excluded. IMPRESSION: 1. Interim removal of right chest tube. Left chest tube in stable position. No pneumothorax. Right IJ line stable position. 2. Prior CABG. Cardiomegaly with diffuse bilateral pulmonary infiltrates consistent with pulmonary edema again  noted. Pulmonary edema has progressed slightly from prior exam. Small pleural effusions cannot be excluded . Electronically Signed   By: Rosana Fret: a-fib with RVR from 6:30 till 7:30 this am, then intermittent pacing, now SR    ASSESSMENT AND PLAN  60 year old post MV Repair on 04/30/16  1. PAF with RVR, agree with Amiodarone drip, would avoid BB for now as she had intermittent pacing this am, now in SR  2. Acute on chronic diastolic CHF - she diuresed well so far, still fluid overloaded, start lasix 40 mg po BID with KCl 10 mEq BID  3. Hypertension - lasix for now, we will add antihypertensives as needed    4. S/P MV repair, we will order a post op echo  Signed, Ena Dawley MD, [facility] 05/04/2016

## 2016-05-04 NOTE — Progress Notes (Signed)
[address]       [place] [address]             [phone]        CARDIOTHORACIC SURGERY PROGRESS NOTE   R4 Days Post-Op Procedure(s) (LRB): MITRAL VALVE REPAIR  (N/A) TRANSESOPHAGEAL ECHOCARDIOGRAM (TEE) (N/A)  Subjective: Sleepy but arousable.  Follows some simple commands.  Oriented to name and place.  Still intermittently receiving IV haldol and midazolam  Objective: Vital signs: BP Readings from Last 1 Encounters:  05/04/16 126/72   Pulse Readings from Last 1 Encounters:  05/04/16 59   Resp Readings from Last 1 Encounters:  05/04/16 21   Temp Readings from Last 1 Encounters:  05/04/16 97.7 F (36.5 C) Oral    Hemodynamics:    Physical Exam:  Rhythm:   Episode rapid Afib this morning, currently NSR on IV amiodarone  Breath sounds: Diminished at bases  Heart sounds:  RRR  Incisions:  Dressing dry, intact  Abdomen:  Soft, non-distended, non-tender, + BM  Extremities:  Warm, well-perfused  Chest tubes:  trivial volume thin serosanguinous output, no air leak    Intake/Output from previous day: 06/11 0701 - 06/12 0700 In: 1628 [I.V.:1328; IV Piggyback:300] Out: 2810 [Urine:2750; Chest Tube:60] Intake/Output this shift: Total I/O In: 74.8 [I.V.:74.8] Out: 80 [Urine:60; Chest Tube:20]  Lab Results:  CBC: Recent Labs  05/03/16 0415 05/03/16 1651 05/04/16 0340  WBC 19.4*  --  19.7*  HGB 8.7* 10.9* 9.0*  HCT 29.6* 32.0* 31.7*  PLT 321  --  399    BMET:  Recent Labs  05/03/16 0415 05/03/16 1651 05/04/16 0340  NA 143 141 142  K 3.5 3.7 3.6  CL 102 98* 99*  CO2 34*  --  36*  GLUCOSE 113* 101* 80  BUN 18 20 15   CREATININE 0.71 0.80 0.64  CALCIUM 10.6*  --  10.8*     PT/INR:   Recent Labs  05/04/16 0340  LABPROT 16.6*  INR 1.33    CBG (last 3)   Recent Labs  05/03/16 1920 05/04/16 0030 05/04/16 0331  GLUCAP 94 86 71    ABG    Component Value Date/Time   PHART 7.343* 05/02/2016 0345   PCO2ART  60.3* 05/02/2016 0345   PO2ART 112* 05/02/2016 0345   HCO3 31.9* 05/02/2016 0345   TCO2 35 05/03/2016 1651   O2SAT 69.6 05/03/2016 0423    CXR: PORTABLE CHEST 1 VIEW  COMPARISON: 05/03/2016.  FINDINGS: Right IJ line in stable position. Interim removal of right chest tube. Left chest tube in stable position. No pneumothorax. Prior CABG with bilateral diffuse pulmonary infiltrates consistent with persistent pulmonary edema, slightly increased from prior exam. Small pleural effusions cannot be excluded.  IMPRESSION: 1. Interim removal of right chest tube. Left chest tube in stable position. No pneumothorax. Right IJ line stable position.  2. Prior CABG. Cardiomegaly with diffuse bilateral pulmonary infiltrates consistent with pulmonary edema again noted. Pulmonary edema has progressed slightly from prior exam. Small pleural effusions cannot be excluded .   Electronically Signed  By: Marcello Moores Register  On: 05/04/2016 07:30   Assessment/Plan: S/P Procedure(s) (LRB): MITRAL VALVE REPAIR  (N/A) TRANSESOPHAGEAL ECHOCARDIOGRAM (TEE) (N/A)  Overall stable POD4 Episode rapid Afib this morning, currently NSR on IV amiodarone BP stable Breathing comfortably w/ O2 sats 94-100% on 2 L/min via [place] Post-op delirium w/ severe pre-op anxiety, depression, impulsive behavior - overall improving but still requiring intermittent sedation and restraints for safety, control Acute  on chronic diastolic CHF with expected post-op volume excess, I/O's negative 1.2 liters yesterday and weight down 4 lbs but still 15 lbs above pre-op baseline Expected post op acute blood loss anemia, Hgb stable 9.0 Expected post op atelectasis, mild Post op leukocytosis w/out fever, now on IV Fortaz Strep viridans endocarditis - pre-op follow up blood cultures off antibiotics negative and no signs of active infection at time of mitral valve repair Pulmonary hypertension Obesity hypoventilation syndrome Type  II diabetes mellitus, CBG's low last 12-24 hrs Morbid obesity 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

## 2016-05-05 ENCOUNTER — Inpatient Hospital Stay (HOSPITAL_COMMUNITY): Payer: Medicaid Other

## 2016-05-05 LAB — CBC
HCT: 31.5 % — ABNORMAL LOW (ref 36.0–46.0)
Hemoglobin: 9 g/dL — ABNORMAL LOW (ref 12.0–15.0)
MCH: 25.5 pg — AB (ref 26.0–34.0)
MCHC: 28.6 g/dL — ABNORMAL LOW (ref 30.0–36.0)
MCV: 89.2 fL (ref 78.0–100.0)
PLATELETS: 473 10*3/uL — AB (ref 150–400)
RBC: 3.53 MIL/uL — ABNORMAL LOW (ref 3.87–5.11)
RDW: 17.4 % — AB (ref 11.5–15.5)
WBC: 15.4 10*3/uL — ABNORMAL HIGH (ref 4.0–10.5)

## 2016-05-05 LAB — PROTIME-INR
INR: 1.34 (ref 0.00–1.49)
Prothrombin Time: 16.7 seconds — ABNORMAL HIGH (ref 11.6–15.2)

## 2016-05-05 LAB — GLUCOSE, CAPILLARY
Glucose-Capillary: 100 mg/dL — ABNORMAL HIGH (ref 65–99)
Glucose-Capillary: 105 mg/dL — ABNORMAL HIGH (ref 65–99)
Glucose-Capillary: 110 mg/dL — ABNORMAL HIGH (ref 65–99)
Glucose-Capillary: 113 mg/dL — ABNORMAL HIGH (ref 65–99)
Glucose-Capillary: 135 mg/dL — ABNORMAL HIGH (ref 65–99)
Glucose-Capillary: 157 mg/dL — ABNORMAL HIGH (ref 65–99)

## 2016-05-05 LAB — BASIC METABOLIC PANEL
Anion gap: 7 (ref 5–15)
BUN: 15 mg/dL (ref 6–20)
CALCIUM: 10.7 mg/dL — AB (ref 8.9–10.3)
CO2: 36 mmol/L — ABNORMAL HIGH (ref 22–32)
Chloride: 97 mmol/L — ABNORMAL LOW (ref 101–111)
Creatinine, Ser: 0.74 mg/dL (ref 0.44–1.00)
GFR calc Af Amer: >60 mL/min (ref >60)
GLUCOSE: 110 mg/dL — AB (ref 65–99)
POTASSIUM: 3.8 mmol/L (ref 3.5–5.1)
Sodium: 140 mmol/L (ref 135–145)

## 2016-05-05 LAB — AEROBIC/ANAEROBIC CULTURE W GRAM STAIN (SURGICAL/DEEP WOUND): Culture: NO GROWTH

## 2016-05-05 LAB — AEROBIC/ANAEROBIC CULTURE (SURGICAL/DEEP WOUND): CULTURE: NO GROWTH

## 2016-05-05 MED ORDER — SPIRONOLACTONE 25 MG PO TABS
50.0000 mg | ORAL_TABLET | Freq: Every day | ORAL | Status: DC
  Administered 2016-05-05 – 2016-05-12 (×7): 50 mg via ORAL
  Filled 2016-05-05 (×9): qty 2

## 2016-05-05 MED ORDER — POTASSIUM CHLORIDE 10 MEQ/50ML IV SOLN
10.0000 meq | INTRAVENOUS | Status: AC
  Administered 2016-05-05 (×3): 10 meq via INTRAVENOUS
  Filled 2016-05-05 (×3): qty 50

## 2016-05-05 MED ORDER — TRAZODONE HCL 100 MG PO TABS
100.0000 mg | ORAL_TABLET | Freq: Every day | ORAL | Status: DC
  Administered 2016-05-05 – 2016-05-10 (×6): 100 mg via ORAL
  Filled 2016-05-05 (×7): qty 1

## 2016-05-05 MED ORDER — ATENOLOL 25 MG PO TABS
25.0000 mg | ORAL_TABLET | Freq: Two times a day (BID) | ORAL | Status: DC
  Administered 2016-05-05: 25 mg via ORAL
  Filled 2016-05-05 (×3): qty 1

## 2016-05-05 MED ORDER — FUROSEMIDE 10 MG/ML IJ SOLN
80.0000 mg | Freq: Two times a day (BID) | INTRAMUSCULAR | Status: DC
  Administered 2016-05-05 – 2016-05-08 (×7): 80 mg via INTRAVENOUS
  Filled 2016-05-05 (×8): qty 8

## 2016-05-05 MED ORDER — CLONAZEPAM 0.5 MG PO TABS
0.5000 mg | ORAL_TABLET | Freq: Every day | ORAL | Status: DC
  Administered 2016-05-05 – 2016-05-11 (×7): 0.5 mg via ORAL
  Filled 2016-05-05 (×7): qty 1

## 2016-05-05 MED ORDER — CLONAZEPAM 0.5 MG PO TABS
0.5000 mg | ORAL_TABLET | Freq: Three times a day (TID) | ORAL | Status: DC | PRN
  Administered 2016-05-05 – 2016-05-12 (×6): 0.5 mg via ORAL
  Filled 2016-05-05 (×5): qty 1

## 2016-05-05 MED ORDER — WARFARIN SODIUM 2.5 MG PO TABS
2.5000 mg | ORAL_TABLET | Freq: Every day | ORAL | Status: DC
  Administered 2016-05-05 – 2016-05-06 (×2): 2.5 mg via ORAL
  Filled 2016-05-05 (×2): qty 1

## 2016-05-05 MED FILL — Heparin Sodium (Porcine) Inj 1000 Unit/ML: INTRAMUSCULAR | Qty: 2500 | Status: AC

## 2016-05-05 MED FILL — Dexmedetomidine HCl in NaCl 0.9% IV Soln 400 MCG/100ML: INTRAVENOUS | Qty: 100 | Status: AC

## 2016-05-05 NOTE — Progress Notes (Signed)
Patient Name: Margaret [name] Date of Encounter: 05/05/2016  Principal Problem:   S/P mitral valve repair Active Problems:   Essential hypertension   Endocarditis of mitral valve   Mitral regurgitation   HLD (hyperlipidemia)   Type 2 diabetes mellitus with circulatory disorder (HCC)   History of stroke   Acute on chronic diastolic congestive heart failure due to valvular disease ([place])   CHF due to valvular disease, acute on chronic, diastolic   Other depression due to general medical condition   Panic disorder without agoraphobia with severe panic attacks   Length of Stay: 21  SUBJECTIVE  The patient is more awake today, still has SOB but improving.  CURRENT MEDS . acetaminophen  1,000 mg Oral Q6H  . antiseptic oral rinse  7 mL Mouth Rinse QID  . aspirin EC  81 mg Oral Daily  . atenolol  25 mg Oral BID  . bisacodyl  10 mg Oral Daily   Or  . bisacodyl  10 mg Rectal Daily  . cefTAZidime (FORTAZ)  IV  1 g Intravenous Q8H  . clonazePAM  0.5 mg Oral QHS  . docusate sodium  200 mg Oral Daily  . enoxaparin (LOVENOX) injection  40 mg Subcutaneous Q24H  . escitalopram  10 mg Oral QHS  . famotidine (PEPCID) IV  20 mg Intravenous Q12H  . furosemide  80 mg Intravenous BID  . insulin aspart  0-24 Units Subcutaneous Q4H  . nystatin   Topical BID  . sodium chloride flush  3 mL Intravenous Q12H  . spironolactone  50 mg Oral Daily  . traZODone  100 mg Oral QHS  . warfarin  2.5 mg Oral q1800  . Warfarin - Physician Dosing Inpatient   Does not apply q1800   . sodium chloride Stopped (05/01/16 1200)  . sodium chloride 50 mL/hr at 05/04/16 1900  . amiodarone 30 mg/hr (05/05/16 1133)    OBJECTIVE  Filed Vitals:   05/05/16 0954 05/05/16 1000 05/05/16 1100 05/05/16 1145  BP: 125/68 164/80 104/80   Pulse: 72 73 60   Temp:    97.7 F (36.5 C)  TempSrc:    Oral  Resp:  27 20   Height:      Weight:      SpO2:  93% 95%     Intake/Output Summary (Last 24 hours) at  05/05/16 1238 Last data filed at 05/05/16 1100  Gross per 24 hour  Intake 1411.9 ml  Output   2925 ml  Net -1513.1 ml   Filed Weights   05/03/16 0500 05/04/16 0345 05/05/16 0111  Weight: 255 lb 11.7 oz (116 kg) 251 lb 5.2 oz (114 kg) 246 lb 7.6 oz (111.8 kg)    PHYSICAL EXAM  General: AAOx3 Neuro: Moves all extremities spontaneously. HEENT:  Normal  Neck: Supple without bruits or JVD. Lungs:  Resp regular and unlabored, rales at both bases Heart: RRR no s3, s4, or murmurs. Abdomen: Soft, non-tender, non-distended, BS + x 4.  Extremities: No clubbing, cyanosis or edema. DP/PT/Radials 2+ and equal bilaterally.  Accessory Clinical Findings  CBC  Recent Labs  05/04/16 0340 05/05/16 0400  WBC 19.7* 15.4*  HGB 9.0* 9.0*  HCT 31.7* 31.5*  MCV 89.0 89.2  PLT 399 [id number]*   Basic Metabolic Panel  Recent Labs  05/04/16 0340 05/05/16 0400  NA 142 140  K 3.6 3.8  CL 99* 97*  CO2 36* 36*  GLUCOSE 80 110*  BUN 15 15  CREATININE 0.64 0.74  CALCIUM 10.8* 10.7*   Liver Function Tests  Recent Labs  05/03/16 0415 05/04/16 0340  AST 20 16  ALT 13* 15  ALKPHOS 55 60  BILITOT 0.7 0.6  PROT 5.9* 6.7  ALBUMIN 2.6* 2.8*   Radiology/Studies  Dg Chest Port 1 View  05/04/2016  CLINICAL DATA:  Shortness of breath.  Chest tube placement. EXAM: PORTABLE CHEST 1 VIEW COMPARISON:  05/03/2016. FINDINGS: Right IJ line in stable position. Interim removal of right chest tube. Left chest tube in stable position. No pneumothorax. Prior CABG with bilateral diffuse pulmonary infiltrates consistent with persistent pulmonary edema, slightly increased from prior exam. Small pleural effusions cannot be excluded. IMPRESSION: 1. Interim removal of right chest tube. Left chest tube in stable position. No pneumothorax. Right IJ line stable position. 2. Prior CABG. Cardiomegaly with diffuse bilateral pulmonary infiltrates consistent with pulmonary edema again noted. Pulmonary edema has progressed  slightly from prior exam. Small pleural effusions cannot be excluded . Electronically Signed   By: Rosana Fret: a-fib with RVR from 6:30 till 7:30 this am, then intermittent pacing, now SR    ASSESSMENT AND PLAN  60 year old post MV Repair on 04/30/16  1. PAF with RVR, agree with continuation of Amiodarone drip, maintains SR  2. Acute on chronic diastolic CHF - responded well to iv diuresis, Crea 0.8, still fluid overloaded,continue current lasix 80 mg iv BID, K 3.8  3. Hypertension - controlled  4. S/P MV repair, we will order a post op echo  Signed, Ena Dawley MD, [facility] 05/05/2016

## 2016-05-05 NOTE — Progress Notes (Signed)
Spoke with Financial controller who informed me he would tell primary RN that IV Team does not insert this patient's PICCs.   They have to be done in Interventional Radiology.    She already has a tunnelled line in place.   Primary RN was busy and could not come to phone per Network engineer.

## 2016-05-05 NOTE — Progress Notes (Addendum)
[address]       [place] [address]             [phone]        CARDIOTHORACIC SURGERY PROGRESS NOTE   R5 Days Post-Op Procedure(s) (LRB): MITRAL VALVE REPAIR  (N/A) TRANSESOPHAGEAL ECHOCARDIOGRAM (TEE) (N/A)  Subjective: Looks much better.  Sitting up in chair, brushing her hair.  Wants something to eat.  Completely alert and oriented.  Objective: Vital signs: BP Readings from Last 1 Encounters:  05/05/16 114/67   Pulse Readings from Last 1 Encounters:  05/05/16 68   Resp Readings from Last 1 Encounters:  05/05/16 24   Temp Readings from Last 1 Encounters:  05/05/16 97.8 F (36.6 C) Oral    Hemodynamics:    Physical Exam:  Rhythm:   sinus  Breath sounds: Scattered rhonchi, coarse, symmetrical  Heart sounds:  RRR w/out murmur  Incisions:  Dressings dry, intact  Abdomen:  Soft, non-distended, non-tender  Extremities:  Warm, well-perfused    Intake/Output from previous day: 06/12 0701 - 06/13 0700 In: 1573.5 [I.V.:1348.5; IV Piggyback:100] Out: 2265 [Urine:2245; Chest Tube:20] Intake/Output this shift: Total I/O In: 66.7 [I.V.:66.7] Out: 200 [Urine:200]  Lab Results:  CBC: Recent Labs  05/04/16 0340 05/05/16 0400  WBC 19.7* 15.4*  HGB 9.0* 9.0*  HCT 31.7* 31.5*  PLT 399 473*    BMET:  Recent Labs  05/04/16 0340 05/05/16 0400  NA 142 140  K 3.6 3.8  CL 99* 97*  CO2 36* 36*  GLUCOSE 80 110*  BUN 15 15  CREATININE 0.64 0.74  CALCIUM 10.8* 10.7*     PT/INR:   Recent Labs  05/05/16 0400  LABPROT 16.7*  INR 1.34    CBG (last 3)   Recent Labs  05/04/16 1924 05/04/16 2341 05/05/16 0359  GLUCAP 88 105* 100*    ABG    Component Value Date/Time   PHART 7.343* 05/02/2016 0345   PCO2ART 60.3* 05/02/2016 0345   PO2ART 112* 05/02/2016 0345   HCO3 31.9* 05/02/2016 0345   TCO2 35 05/03/2016 1651   O2SAT 69.6 05/03/2016 0423    CXR: PORTABLE CHEST 1 VIEW  COMPARISON: 05/04/2016 and  earlier.  FINDINGS: Portable AP semi upright view at 0629 hours. Left chest tube has been removed. No pneumothorax. Stable right IJ central line. There may be small epicardial pacer wires still in place. Stable cardiomegaly and mediastinal contours. Regressed but not resolved pulmonary vascular congestion/edema since yesterday. No large pleural effusions. No areas of worsening ventilation.  IMPRESSION: 1. Left chest tube removed. No pneumothorax. 2. Regressed pulmonary edema since yesterday. 3. No new cardiopulmonary abnormality.   Electronically Signed  By: Genevie Ann M.D.  On: 05/05/2016 07:18  Assessment/Plan: S/P Procedure(s) (LRB): MITRAL VALVE REPAIR  (N/A) TRANSESOPHAGEAL ECHOCARDIOGRAM (TEE) (N/A)  Overall doing well POD5 Post op Afib, currently NSR on IV amiodarone BP stable Breathing comfortably w/ O2 sats 98-100% on 2 L/min via [place] Post-op delirium w/ pre-existing severe anxiety, depression, impulsive behavior - dramatically improved Acute on chronic diastolic CHF with expected post-op volume excess, I/O's negative 700 mL yesterday and weight down 5 lbs but still 10 lbs above pre-op baseline Expected post op acute blood loss anemia, Hgb stable 9.0 Expected post op atelectasis, mild Post op leukocytosis w/out fever, WBC down to 15.4k today - Day #2 IV Fortaz for possible HCAP Strep viridans endocarditis - resolved - pre-op follow up blood cultures off antibiotics negative and no signs of active  infection at time of mitral valve repair Pulmonary hypertension Obesity hypoventilation syndrome Type II diabetes mellitus, CBG's well controlled last 12-24 hrs Morbid obesity 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

## 2016-05-05 NOTE — Progress Notes (Signed)
Speech Language Pathology Treatment: Dysphagia  Patient Details Name: Margaret [name] MRN: [id number] DOB: 08/25/1956 Today's Date: 05/05/2016 Time: [id number] SLP Time Calculation (min) (ACUTE ONLY): 15 min  Assessment / Plan / Recommendation Clinical Impression  Pt seen for dysphagia followup. Pt much more alert and interactive today. She reports feeling anxious about her ability to swallow. Pt was trialed with ice chips, thin and puree. Pt presents with appearance of difficulty coordinating respiration and swallow. Delayed wet cough noted after trials of thin via teaspoon indicative of airway compromise. No s/s aspiration with puree. Recommend: NPO except for snacks of puree with meds and single ice chips with nursing staff present. Pt may benefit from objective measure of swallow function (FEES) pending her progress. Will follow.    HPI HPI: Margaret [name] is a 60 y.o. female with a hx of COPD, HFpEF (seen by Dr. Tamala Julian during hospital admission 7/13), OSA, HTN, HL, poorly controlled diabetes, tobacco abuse.   Admitted in 2/17 with splenic infarct and was placed on Xarelto for anticoagulation.   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 She was noted to have strep viridans bacteremia and her presentation was felt to be related to septic emboli. 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 She underwent endovascular thrombectomy by IR. It was felt that she would need to undergo rehabilitation with significant recovery before proceeding to mitral valve surgery. She was discharged to [facility].  The patient was seen by ST during the March admission and was recomended to be on a dysphagia 1 diet with thin liquids.       SLP Plan  Continue with current plan of care      Recommendations  Diet recommendations: NPO (except for single ice chips and meds crushed with puree) Medication Administration: Crushed with puree Supervision: Full supervision/cueing for compensatory strategies Postural Changes and/or Swallow Maneuvers: Seated upright 90 degrees             Oral Care Recommendations: Oral care QID Follow up Recommendations: Skilled Nursing facility (TBD) Plan: Continue with current plan of care     [place] MA, [place] Pager [phone] 05/05/2016, 10:31 AM

## 2016-05-05 NOTE — Progress Notes (Signed)
Patient ID: MARLON OVER, female   DOB: 1956/09/03, 59 y.o.   MRN: [id number] EVENING ROUNDS NOTE :     [address]       [place] [address]             [phone]                 5 Days Post-Op Procedure(s) (LRB): MITRAL VALVE REPAIR  (N/A) TRANSESOPHAGEAL ECHOCARDIOGRAM (TEE) (N/A)  Total Length of Stay:  LOS: 21 days  BP 94/71 mmHg  Pulse 62  Temp(Src) 98.3 F (36.8 C) (Oral)  Resp 18  Ht 5\' 5"  (1.651 m)  Wt 246 lb 7.6 oz (111.8 kg)  BMI 41.02 kg/m2  SpO2 93%  .Intake/Output      06/12 0701 - 06/13 0700 06/13 0701 - 06/14 0700   I.V. (mL/kg) 1348.5 (12.1) 466.9 (4.2)   Other 125    IV Piggyback 100 50   Total Intake(mL/kg) 1573.5 (14.1) 516.9 (4.6)   Urine (mL/kg/hr) 2245 (0.8) 1185 (0.9)   Stool  0 (0)   Chest Tube 20 (0)    Total Output 2265 1185   Net -691.5 -668.1        Stool Occurrence  1 x     . sodium chloride 50 mL (05/05/16 1247)  . amiodarone 30 mg/hr (05/05/16 1400)     Lab Results  Component Value Date   WBC 15.4* 05/05/2016   HGB 9.0* 05/05/2016   HCT 31.5* 05/05/2016   PLT 473* 05/05/2016   GLUCOSE 110* 05/05/2016   CHOL 155 02/02/2016   TRIG 137 02/02/2016   HDL 21* 02/02/2016   LDLCALC 107* 02/02/2016   ALT 15 05/04/2016   AST 16 05/04/2016   NA 140 05/05/2016   K 3.8 05/05/2016   CL 97* 05/05/2016   CREATININE 0.74 05/05/2016   BUN 15 05/05/2016   CO2 36* 05/05/2016   TSH 2.190 04/27/2012   INR 1.34 05/05/2016   HGBA1C 5.6 04/29/2016   Up in chair, slow progess  Grace Isaac MD  Beeper [phone] Office [phone] 05/05/2016 6:27 PM

## 2016-05-05 NOTE — Progress Notes (Signed)
Noted medical plan to reconsult PMR team for possible admission to inpt rehab later this week. Please place order for an inpt rehab consult if you would like an assessment for an inpt rehab admission when medically ready. Rehab MD last consulted 02/11/16 during last admission. [id number]

## 2016-05-06 DIAGNOSIS — E1159 Type 2 diabetes mellitus with other circulatory complications: Secondary | ICD-10-CM

## 2016-05-06 DIAGNOSIS — I69322 Dysarthria following cerebral infarction: Secondary | ICD-10-CM | POA: Insufficient documentation

## 2016-05-06 DIAGNOSIS — I6930 Unspecified sequelae of cerebral infarction: Secondary | ICD-10-CM | POA: Insufficient documentation

## 2016-05-06 DIAGNOSIS — E876 Hypokalemia: Secondary | ICD-10-CM

## 2016-05-06 DIAGNOSIS — D72829 Elevated white blood cell count, unspecified: Secondary | ICD-10-CM | POA: Insufficient documentation

## 2016-05-06 DIAGNOSIS — D473 Essential (hemorrhagic) thrombocythemia: Secondary | ICD-10-CM

## 2016-05-06 DIAGNOSIS — D62 Acute posthemorrhagic anemia: Secondary | ICD-10-CM | POA: Insufficient documentation

## 2016-05-06 DIAGNOSIS — Z8679 Personal history of other diseases of the circulatory system: Secondary | ICD-10-CM

## 2016-05-06 DIAGNOSIS — J449 Chronic obstructive pulmonary disease, unspecified: Secondary | ICD-10-CM

## 2016-05-06 DIAGNOSIS — R001 Bradycardia, unspecified: Secondary | ICD-10-CM

## 2016-05-06 DIAGNOSIS — R6510 Systemic inflammatory response syndrome (SIRS) of non-infectious origin without acute organ dysfunction: Secondary | ICD-10-CM | POA: Insufficient documentation

## 2016-05-06 DIAGNOSIS — D75839 Thrombocytosis, unspecified: Secondary | ICD-10-CM | POA: Insufficient documentation

## 2016-05-06 DIAGNOSIS — R0682 Tachypnea, not elsewhere classified: Secondary | ICD-10-CM | POA: Insufficient documentation

## 2016-05-06 DIAGNOSIS — R0602 Shortness of breath: Secondary | ICD-10-CM | POA: Insufficient documentation

## 2016-05-06 LAB — CULTURE, BLOOD (SINGLE): Culture: NO GROWTH

## 2016-05-06 LAB — CBC WITH DIFFERENTIAL/PLATELET
BASOS PCT: 1 %
Basophils Absolute: 0.1 10*3/uL (ref 0.0–0.1)
Eosinophils Absolute: 1.1 10*3/uL — ABNORMAL HIGH (ref 0.0–0.7)
Eosinophils Relative: 8 %
HEMATOCRIT: 33.7 % — AB (ref 36.0–46.0)
Hemoglobin: 9.8 g/dL — ABNORMAL LOW (ref 12.0–15.0)
LYMPHS PCT: 16 %
Lymphs Abs: 2.3 10*3/uL (ref 0.7–4.0)
MCH: 25.9 pg — ABNORMAL LOW (ref 26.0–34.0)
MCHC: 29.1 g/dL — AB (ref 30.0–36.0)
MCV: 89.2 fL (ref 78.0–100.0)
MONO ABS: 1.1 10*3/uL — AB (ref 0.1–1.0)
MONOS PCT: 8 %
NEUTROS ABS: 10.1 10*3/uL — AB (ref 1.7–7.7)
Neutrophils Relative %: 69 %
Platelets: 567 10*3/uL — ABNORMAL HIGH (ref 150–400)
RBC: 3.78 MIL/uL — ABNORMAL LOW (ref 3.87–5.11)
RDW: 17.6 % — AB (ref 11.5–15.5)
WBC: 14.7 10*3/uL — ABNORMAL HIGH (ref 4.0–10.5)

## 2016-05-06 LAB — COMPREHENSIVE METABOLIC PANEL
ALBUMIN: 2.6 g/dL — AB (ref 3.5–5.0)
ALK PHOS: 69 U/L (ref 38–126)
ALT: 13 U/L — AB (ref 14–54)
ANION GAP: 10 (ref 5–15)
AST: 17 U/L (ref 15–41)
BUN: 13 mg/dL (ref 6–20)
CALCIUM: 10.7 mg/dL — AB (ref 8.9–10.3)
CO2: 34 mmol/L — AB (ref 22–32)
Chloride: 95 mmol/L — ABNORMAL LOW (ref 101–111)
Creatinine, Ser: 0.73 mg/dL (ref 0.44–1.00)
GFR calc Af Amer: >60 mL/min (ref >60)
GFR calc non Af Amer: >60 mL/min (ref >60)
GLUCOSE: 118 mg/dL — AB (ref 65–99)
Potassium: 3.3 mmol/L — ABNORMAL LOW (ref 3.5–5.1)
SODIUM: 139 mmol/L (ref 135–145)
Total Bilirubin: 0.6 mg/dL (ref 0.3–1.2)
Total Protein: 6.5 g/dL (ref 6.5–8.1)

## 2016-05-06 LAB — PROTIME-INR
INR: 1.52 — ABNORMAL HIGH (ref 0.00–1.49)
Prothrombin Time: 18.3 seconds — ABNORMAL HIGH (ref 11.6–15.2)

## 2016-05-06 LAB — GLUCOSE, CAPILLARY
Glucose-Capillary: 106 mg/dL — ABNORMAL HIGH (ref 65–99)
Glucose-Capillary: 116 mg/dL — ABNORMAL HIGH (ref 65–99)
Glucose-Capillary: 124 mg/dL — ABNORMAL HIGH (ref 65–99)
Glucose-Capillary: 128 mg/dL — ABNORMAL HIGH (ref 65–99)
Glucose-Capillary: 140 mg/dL — ABNORMAL HIGH (ref 65–99)
Glucose-Capillary: 164 mg/dL — ABNORMAL HIGH (ref 65–99)

## 2016-05-06 LAB — PREALBUMIN: Prealbumin: 8.9 mg/dL — ABNORMAL LOW (ref 18–38)

## 2016-05-06 LAB — PHOSPHORUS: Phosphorus: 2.8 mg/dL (ref 2.5–4.6)

## 2016-05-06 LAB — MAGNESIUM: Magnesium: 1.8 mg/dL (ref 1.7–2.4)

## 2016-05-06 MED ORDER — POTASSIUM CHLORIDE 10 MEQ/50ML IV SOLN
10.0000 meq | INTRAVENOUS | Status: AC
  Administered 2016-05-06 (×5): 10 meq via INTRAVENOUS
  Filled 2016-05-06 (×3): qty 50

## 2016-05-06 MED ORDER — ATENOLOL 25 MG PO TABS
12.5000 mg | ORAL_TABLET | Freq: Two times a day (BID) | ORAL | Status: DC
  Administered 2016-05-06 – 2016-05-12 (×8): 12.5 mg via ORAL
  Filled 2016-05-06 (×13): qty 1

## 2016-05-06 MED ORDER — MOVING RIGHT ALONG BOOK
Freq: Once | Status: AC
  Administered 2016-05-06: 13:00:00
  Filled 2016-05-06: qty 1

## 2016-05-06 MED ORDER — SODIUM CHLORIDE 0.9% FLUSH
10.0000 mL | Freq: Two times a day (BID) | INTRAVENOUS | Status: DC
  Administered 2016-05-06 – 2016-05-10 (×6): 10 mL
  Administered 2016-05-11: 20 mL

## 2016-05-06 MED ORDER — INSULIN ASPART 100 UNIT/ML ~~LOC~~ SOLN
0.0000 [IU] | Freq: Three times a day (TID) | SUBCUTANEOUS | Status: DC
  Administered 2016-05-06: 2 [IU] via SUBCUTANEOUS
  Administered 2016-05-07 – 2016-05-08 (×4): 3 [IU] via SUBCUTANEOUS
  Administered 2016-05-09: 4 [IU] via SUBCUTANEOUS
  Administered 2016-05-09 – 2016-05-11 (×7): 3 [IU] via SUBCUTANEOUS
  Administered 2016-05-12: 4 [IU] via SUBCUTANEOUS
  Administered 2016-05-12 (×2): 3 [IU] via SUBCUTANEOUS

## 2016-05-06 MED ORDER — SODIUM CHLORIDE 0.9% FLUSH: 10.0000 mL | INTRAVENOUS | Status: DC | PRN

## 2016-05-06 MED ORDER — CETYLPYRIDINIUM CHLORIDE 0.05 % MT LIQD
7.0000 mL | Freq: Two times a day (BID) | OROMUCOSAL | Status: DC
  Administered 2016-05-07 – 2016-05-08 (×2): 7 mL via OROMUCOSAL

## 2016-05-06 NOTE — Progress Notes (Addendum)
[address]       [place] [address]             [phone]        CARDIOTHORACIC SURGERY PROGRESS NOTE   R6 Days Post-Op Procedure(s) (LRB): MITRAL VALVE REPAIR  (N/A) TRANSESOPHAGEAL ECHOCARDIOGRAM (TEE) (N/A)  Subjective: Looks good.  Complains that she's "bored"  No pain.  No SOB.  Hungry.  Still intermittently anxious but much improved.  Objective: Vital signs: BP Readings from Last 1 Encounters:  05/06/16 121/76   Pulse Readings from Last 1 Encounters:  05/06/16 55   Resp Readings from Last 1 Encounters:  05/06/16 27   Temp Readings from Last 1 Encounters:  05/06/16 97.6 F (36.4 C) Oral    Hemodynamics:    Physical Exam:  Rhythm:   Sinus w/ HR 55-60  Breath sounds: Scattered rhonchi  Heart sounds:  RRR w/out murmur  Incisions:  Clean and dry  Abdomen:  Soft, non-distended, non-tender  Extremities:  Warm, well-perfused    Intake/Output from previous day: 06/13 0701 - 06/14 0700 In: 1517.4 [I.V.:1467.4; IV Piggyback:50] Out: [id number] [Urine:1185] Intake/Output this shift: Total I/O In: 66.7 [I.V.:66.7] Out: -   Lab Results:  CBC: Recent Labs  05/05/16 0400 05/06/16 0706  WBC 15.4* 14.7*  HGB 9.0* 9.8*  HCT 31.5* 33.7*  PLT 473* 567*    BMET:  Recent Labs  05/05/16 0400 05/06/16 0706  NA 140 139  K 3.8 3.3*  CL 97* 95*  CO2 36* 34*  GLUCOSE 110* 118*  BUN 15 13  CREATININE 0.74 0.73  CALCIUM 10.7* 10.7*     PT/INR:   Recent Labs  05/06/16 0706  LABPROT 18.3*  INR 1.52*    CBG (last 3)   Recent Labs  05/05/16 1913 05/06/16 0006 05/06/16 0418  GLUCAP 157* 128* 106*    ABG    Component Value Date/Time   PHART 7.343* 05/02/2016 0345   PCO2ART 60.3* 05/02/2016 0345   PO2ART 112* 05/02/2016 0345   HCO3 31.9* 05/02/2016 0345   TCO2 35 05/03/2016 1651   O2SAT 69.6 05/03/2016 0423    CXR: n/a  Assessment/Plan: S/P Procedure(s) (LRB): MITRAL VALVE REPAIR  (N/A) TRANSESOPHAGEAL  ECHOCARDIOGRAM (TEE) (N/A)  Overall doing well POD6 Post op Afib, currently NSR on IV amiodarone BP stable Breathing comfortably w/ O2 sats 98-100% on 2 L/min via  Post-op delirium w/ pre-existing severe anxiety, depression, impulsive behavior - dramatically improved Acute on chronic diastolic CHF with expected post-op volume excess, I/O's balanced yesterday and weight down 2 lbs but still 8-10 lbs above pre-op baseline Expected post op acute blood loss anemia, Hgb stable 9.8 Expected post op atelectasis, mild Post op leukocytosis w/out fever, WBC down to 14.7k today - Day #4 IV Fortaz for possible HCAP Strep viridans endocarditis - resolved - pre-op follow up blood cultures off antibiotics negative and no signs of active infection at time of mitral valve repair Pulmonary hypertension Obesity hypoventilation syndrome Type II diabetes mellitus, CBG's well controlled last 12-24 hrs Morbid obesity 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

## 2016-05-06 NOTE — Progress Notes (Signed)
CT surgery p.m. Rounds  Patient had a good day mobilize out of bed to chair Patient started on oral diet Pulse and blood pressure remained stable Pacing wires removed

## 2016-05-06 NOTE — Progress Notes (Signed)
Occupational Therapy Treatment Patient Details Name: Margaret [name] MRN: [id number] DOB: 17-Jan-1956 Today's Date: 05/06/2016    History of present illness Margaret [name] is a 60 y.o. female admitted with acute  CHF exacerbation.  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 Underwent MVR on 6/8 with sternotomy. .    OT comments  Pt alert and following commands. Appropriate during session with focus on positioning of LUE and family education. Also issued plate guard to assist pt with increasing her independence with self feeding. Pt motivated to become more independent. Pt needs extensive rehab. If family can provide 24/7 assistance, feel pt could benefit from rehab at [facility] to facilitate safe D/C home and decrease burden of care. Pending progress, pt may need to D/C home at [facility] lift level. Will continue to follow acutely to address established goals.   Follow Up Recommendations  Supervision/Assistance - 24 hour;Other (CIR - family wants to take pt home )    Geophysical data processor (measurements OT);Wheelchair cushion (measurements OT);Hospital bed;3 in 1 bedside comode;Other (comment) (hoyer)    Recommendations for Other Services Rehab consult    Precautions / Restrictions Precautions Precautions: Fall;Sternal Precaution Comments: L hemiparesis; L neglect; L subluxed shoulder Required Braces or Orthoses: Other Brace/Splint (taped L shoulder) Restrictions Weight Bearing Restrictions:  (sternal precautions) LLE Weight Bearing: Weight bearing as tolerated Other Position/Activity Restrictions: sternal precautions       Mobility Bed Mobility Overal bed mobility: +2 for physical assistance             General bed mobility comments: Due to sternal  precuaitons, pt requires increased assistance for bed mobility  Transfers                      Balance               Standing balance comment: will further assess. Sitting in chair, pt pushing toward L side to relieve pressure from bottom. Pt able to return to midline independently                   ADL   Eating/Feeding: Set up;Supervision/ safety;Cueing for safety Eating/Feeding Details (indicate cue type and reason): just resumed water and puree diet. Pt issued plate guard  - educated pt/husband and NT on use Grooming: Wash/dry face;Oral care;Set up;Supervision/safety;Sitting   Upper Body Bathing: Minimal assitance;Sitting   Lower Body Bathing: Maximal assistance;+2 for physical assistance;Bed level (for rolling)   Upper Body Dressing : Maximal assistance                   Functional mobility during ADLs:  (use of sky lift) General ADL Comments: Pt now with sternal precautions      Vision                 Additional Comments: vision imparied. will further assess   Perception Perception Spatial deficits: L neglect;R bias   Praxis      Cognition   Behavior During Therapy: Impulsive;Flat affect Overall Cognitive Status: Impaired/Different from baseline                       Extremity/Trunk Assessment  Upper Extremity Assessment Upper Extremity Assessment: LUE deficits/detail LUE Deficits / Details: Beginning flexor synergy  pattern LUE; subluxed L shoulder. painful shoulder wtih ROM. Limited ROM LUE. able to tolerate @ 080 FF; ER to neutral and 0-45 ;Abd. increased tightness in pectoral region. elbow ROM WFL; Decreased PROM L hand due to tightness in flexors. Has resting hand splint, howver, may need to further assess fit of splint; nonfunctional LUE LUE Sensation: decreased light touch LUE Coordination: decreased fine motor;decreased gross motor   Lower Extremity Assessment Lower Extremity Assessment: Defer to PT evaluation    Cervical / Trunk Assessment Cervical / Trunk Assessment: Other exceptions (trunk shortening L)    Exercises Other Exercises Other Exercises: Educated pt/husband on retrograde massage and PROM L hand and importnace of completing exercises daily to prevent LUE contractures Other Exercises: Educated pt/fmaily on importance of alwasy supporting LUE on 1-2 pillows to decrease shoulder subluxation and elevating L hand to reduce dependent edema in hand Other Exercises: L shoulder taped to reduce subluxation Other Exercises: taught pt self ROM to wrist by interlacing fingers. worked on self ROM L digits   Shoulder Instructions       General Comments      Pertinent Vitals/ Pain       Faces Pain Scale: Hurts little more Pain Location: with L hand ROM into extension Pain Descriptors / Indicators: Grimacing Pain Intervention(s): Limited activity within patient's tolerance;Repositioned;Other (comment) (kenisiotape)  Home Living Family/patient expects to be discharged to:: Skilled nursing facility                                        Prior Functioning/Environment Level of Independence: Needs assistance  Gait / Transfers Assistance Needed: at SNF ambulating a little in parallel bars, assist for transfers and gets around in w/c ADL's / Homemaking Assistance Needed: Pt requires assist for ADLs        Frequency Min 2X/week     Progress Toward Goals  OT Goals(current goals can now be found in the care plan section)     Acute Rehab OT Goals Patient Stated Goal: to regain independence  OT Goal Formulation: With patient Time For Goal Achievement: 05/20/16 Potential to Achieve Goals: Good ADL Goals Pt Will Perform Eating: with set-up;with caregiver independent in assisting;sitting (with AE) Pt Will Perform Grooming: with min assist;sitting;with caregiver independent in assisting Pt Will Perform Lower Body Bathing: with mod assist;bed level;with caregiver independent in  assisting Pt Will Transfer to Toilet: with mod assist;with +2 assist;squat pivot transfer;bedside commode (drop arm) Additional ADL Goal #1: Staff/family will appropriately position LUE in sitting and in bed to reduce subluxation adn pain L shoulder  Plan      Co-evaluation                 End of Session Equipment Utilized During Treatment: Oxygen   Activity Tolerance Patient tolerated treatment well   Patient Left in chair;with call bell/phone within reach;with family/visitor present;with nursing/sitter in room   Nurse Communication Mobility status;Other (comment) (positioning of L UE for subluxation; tape)        Time: 4098-1191 OT Time Calculation (min): 35 min  Charges: OT General Charges $OT Visit: 1 Procedure OT Treatments $Therapeutic Activity: 23-37 mins  Margaret [name],Margaret [name] 05/06/2016, 10:14 AM   Luisa Dago, OTR/L  [phone] 05/06/2016

## 2016-05-06 NOTE — Progress Notes (Signed)
Speech Language Pathology Treatment: Dysphagia  Patient Details Name: Margaret [name] MRN: [id number] DOB: August 03, 1956 Today's Date: 05/06/2016 Time: [id number] SLP Time Calculation (min) (ACUTE ONLY): 14 min  Assessment / Plan / Recommendation Clinical Impression  Pt with significant improvement noted this date with PO intake. No overt s/s aspiration with trials of thin via cup or straw. Vocal quality remained clear. Pt required intermittent verbal cueing to take small, single sips particularly with straw. Pt tended to take multiple consecutive sips with straw and then demonstrate mild SOB. Recommend: Cautious initiation of Dys 1 with thin liquids (water only) via cup. No straw. Upright positioning with all PO. Meds whole with puree. Full supervision to reinforce swallow precautions. Discussed results and recommendations with pt and RN- both parties verbalized understanding and agreement. SLP to follow for tolerance and readiness for upgrades.    HPI HPI: Margaret [name] is a 60 y.o. female with a hx of COPD, HFpEF (seen by Dr. Tamala Julian during hospital admission 7/13), OSA, HTN, HL, poorly controlled diabetes, tobacco abuse.   Admitted in 2/17 with splenic infarct and was placed on Xarelto for anticoagulation.   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 She was noted to have strep viridans bacteremia and her presentation was felt to be related to septic emboli. 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 She underwent endovascular thrombectomy by IR. It was felt that she would need to undergo rehabilitation with significant recovery before proceeding to mitral valve surgery. She was discharged to [facility] SNF.  The patient was seen by ST during the March admission and was recomended to  be on a dysphagia 1 diet with thin liquids.       SLP Plan  Continue with current plan of care     Recommendations  Diet recommendations: Dysphagia 1 (puree);Thin liquid (water only) Liquids provided via: Cup Medication Administration: Whole meds with puree Supervision: Full supervision/cueing for compensatory strategies Compensations: Minimize environmental distractions;Slow rate;Small sips/bites Postural Changes and/or Swallow Maneuvers: Seated upright 90 degrees             Oral Care Recommendations: Oral care QID Follow up Recommendations: Skilled Nursing facility Plan: Continue with current plan of care     [place], [place] Pager [phone] 05/06/2016, 9:23 AM

## 2016-05-06 NOTE — Progress Notes (Signed)
Pacing wires removed per order without incident or change in pt condition.  Juanda Chance

## 2016-05-06 NOTE — Progress Notes (Signed)
Peripherally Inserted Central Catheter/Midline Placement  The IV Nurse has discussed with the patient and/or persons authorized to consent for the patient, the purpose of this procedure and the potential benefits and risks involved with this procedure.  The benefits include less needle sticks, lab draws from the catheter and patient may be discharged home with the catheter.  Risks include, but not limited to, infection, bleeding, blood clot (thrombus formation), and puncture of an artery; nerve damage and irregular heat beat.  Alternatives to this procedure were also discussed.  PICC/Midline Placement Documentation  PICC Double Lumen 05/06/16 Left Brachial 43 cm 2 cm (Active)  Indication for Insertion or Continuance of Line Limited venous access - need for IV therapy >5 days (PICC only) 05/06/2016 11:00 AM  Exposed Catheter (cm) 2 cm 05/06/2016 11:00 AM  Dressing Change Due 05/13/16 05/06/2016 11:00 AM       Margaret [name] 05/06/2016, 11:35 AM

## 2016-05-06 NOTE — Progress Notes (Signed)
PMR consult has been completed.  Admissions coordinator to follow up.    [place] Admissions Coordinator Cell [phone] Office [phone]

## 2016-05-06 NOTE — Consult Note (Signed)
Physical Medicine and Rehabilitation Consult Reason for Consult: Debilitation after mitral valve repair Referring Physician: Dr. Roxy Manns   HPI: Margaret [name] is a 60 y.o. right handed female with history of subarachnoid hemorrhage left parietal occipital region and cortical subcortical ischemic infarcts, dysarthria, dysphagia March 2017 and discharged to skilled nursing facility with hospital course complicated by Streptococcus viridans and findings of severe mitral regurgitation, type 2 diabetes mellitus, COPD, splenic infarct February 2017 while maintained on xarelto. At the nursing facility patient remained essentially bed bound. Prior nursing home placement patient live with her husband. She has assistance from her husband's sister's son and family. One level home. Presented 04/23/2016 plan mitral valve surgery and underwent mitral valve repair 04/30/2016 per Dr. Roxy Manns. Hospital course pain management. Dysphagia maintain on dysphagia #1 thin liquid diet. TPN initiated for nutritional support. Subcutaneous Lovenox for DVT prophylaxis. Acute on chronic blood loss anemia hemoglobin 9.8. Physical and occupational therapy ongoing family requesting to have patient discharged to home instead of back to skilled nursing facility. Recommendations for physical medicine rehabilitation consult.   Review of Systems  Constitutional: Negative for fever and chills.  HENT: Negative for hearing loss.   Eyes: Negative for blurred vision and double vision.  Respiratory: Positive for cough and shortness of breath.   Cardiovascular: Positive for palpitations and leg swelling. Negative for chest pain.  Gastrointestinal: Positive for constipation. Negative for nausea and vomiting.  Genitourinary: Negative for dysuria and hematuria.  Musculoskeletal: Positive for myalgias, back pain and joint pain.  Skin: Negative for rash.  Neurological: Positive for speech change, focal weakness and weakness. Negative for  seizures and headaches.  Psychiatric/Behavioral: Positive for depression.       Panic attacks  All other systems reviewed and are negative.  Past Medical History  Diagnosis Date  . Hypertension   . Hyperlipidemia   . COPD (chronic obstructive pulmonary disease) ([place]) 04/20/12  . Asthma   . Type II diabetes mellitus ([place])   . Anemia   . H/O hiatal hernia   . Osteoarthritis   . Pneumonia 04/2012  . Migraines   . Panic attacks 04/20/12  . Morbid obesity ([place])   . Heart murmur   . Bacterial endocarditis     Strep viridans   . Chronic diastolic CHF (congestive heart failure) ([place]) 04/27/2012  . Pulmonary hypertension assoc with unclear multi-factorial mechanisms ([place]) 01/23/2016  . Positive ANA (antinuclear antibody)   . 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 Stroke ([place])   . Subarachnoid bleed ([place]) 02/01/2016  . S/P mitral valve repair 04/30/2016    Complex valvuloplasty including autologous pericardial patch repair of perforated posterior leaflet, artificial Gore-tex neochord placement x6 and 26 mm Sorin Memo 3D ring annuloplasty   Past Surgical History  Procedure Laterality Date  . Laceration repair  1966    "right upper arm"  . Dilation and curettage of uterus  1990's  . Finger reattachment  1973    "right ring finger"  . Tee without cardioversion N/A 02/05/2016    Procedure: TRANSESOPHAGEAL ECHOCARDIOGRAM (TEE);  Surgeon: Larey Dresser, MD;  Location: [facility];  Service: Cardiovascular;  Laterality: N/A;  . Radiology with anesthesia N/A 02/06/2016    Procedure: RADIOLOGY WITH ANESTHESIA;  Surgeon: Luanne Bras, MD;  Location: [facility];  Service: Radiology;  Laterality: N/A;  . Cardiac catheterization N/A 04/23/2016    Procedure: Right/Left Heart Cath and Coronary Angiography;  Surgeon: Sherren Mocha, MD;  Location: [facility];  Service: Cardiovascular;  Laterality: N/A;  . Mitral valve repair N/A 04/30/2016    Procedure: MITRAL VALVE REPAIR ;  Surgeon: Rexene Alberts, MD;   Location: [facility];  Service: Open Heart Surgery;  Laterality: N/A;  . Tee without cardioversion N/A 04/30/2016    Procedure: TRANSESOPHAGEAL ECHOCARDIOGRAM (TEE);  Surgeon: Rexene Alberts, MD;  Location: [facility];  Service: Open Heart Surgery;  Laterality: N/A;   Family History  Problem Relation Age of Onset  . Allergies    . Hypertension    . Coronary artery disease Mother 25  . Stroke Father   . COPD Father   . Clotting disorder Maternal Grandmother     died from blood clot   Social History:  reports that she quit smoking about 4 years ago. Her smoking use included Cigarettes. She has a 8 pack-year smoking history. She has never used smokeless tobacco. She reports that she does not drink alcohol or use illicit drugs. Allergies:  Allergies  Allergen Reactions  . Codeine Hives, Itching and Other (See Comments)    "breathing problems"   Medications Prior to Admission  Medication Sig Dispense Refill  . Alogliptin-Metformin HCl 12.03-999 MG TABS Take 1 tablet by mouth 2 (two) times daily.    Marland Kitchen aspirin 81 MG tablet Take 81 mg by mouth daily.    Marland Kitchen atenolol (TENORMIN) 25 MG tablet Take 12.5 mg by mouth 2 (two) times daily.     . [EXPIRED] cefTRIAXone (ROCEPHIN) 2 g SOLR injection Inject 2 g into the vein daily. 10 each 0  . cholecalciferol (VITAMIN D) 1000 units tablet Take 1,000 Units by mouth daily.     . clonazePAM (KLONOPIN) 0.5 MG tablet Take 0.5 mg by mouth at bedtime.    . clonazePAM (KLONOPIN) 0.5 MG tablet Take 0.25 mg by mouth 3 (three) times daily as needed for anxiety.    . furosemide (LASIX) 40 MG tablet Take 40 mg by mouth 2 (two) times daily.    . insulin NPH-regular Human (NOVOLIN 70/30) (70-30) 100 UNIT/ML injection Inject 6-21 Units into the skin 2 (two) times daily with a meal. Patient states she takes 70/30 6 units plus 1 additional unit for every 10 mg/dl over glucose of 150 mg/dl (with max being 300 mg/dl) BID    . lovastatin (MEVACOR) 10 MG tablet Take 10 mg by mouth at  bedtime.    . OXYGEN Inhale 2 L into the lungs continuous.    . polyethylene glycol (MIRALAX / GLYCOLAX) packet Take 17 g by mouth daily.    Marland Kitchen senna-docusate (SENOKOT-S) 8.6-50 MG tablet Take 1 tablet by mouth 2 (two) times daily.    . sertraline (ZOLOFT) 25 MG tablet Take 25 mg by mouth daily.    Marland Kitchen spironolactone (ALDACTONE) 25 MG tablet Take 50 mg by mouth daily. Reported on 04/09/2016    . traMADol (ULTRAM) 50 MG tablet Take 50 mg by mouth every 6 (six) hours as needed for moderate pain.    . traZODone (DESYREL) 100 MG tablet Take 100 mg by mouth at bedtime.    Marland Kitchen albuterol (PROVENTIL HFA;VENTOLIN HFA) 108 (90 BASE) MCG/ACT inhaler Inhale 2 puffs into the lungs every 4 (four) hours as needed. For shortness of breath.    Marland Kitchen albuterol (PROVENTIL) (2.5 MG/3ML) 0.083% nebulizer solution Take 2.5 mg by nebulization at bedtime.     . ALPRAZolam (XANAX) 0.5 MG tablet Take 0.5 mg by mouth 3 (three) times daily as needed for anxiety. Reported on 04/09/2016    . nitroGLYCERIN (  NITROSTAT) 0.4 MG SL tablet Place 1 tablet (0.4 mg total) under the tongue every 5 (five) minutes x 3 doses as needed for chest pain. 25 tablet 3    Home: Home Living Family/patient expects to be discharged to:: Skilled nursing facility Additional Comments: spouse states hoped to be able to take pt home, but did not get ramp completed.  States SW was supposed to be working on it.   Functional History: Prior Function Level of Independence: Needs assistance Gait / Transfers Assistance Needed: at SNF ambulating a little in parallel bars, assist for transfers and gets around in w/c ADL's / Homemaking Assistance Needed: Pt requires assist for ADLs  Functional Status:  Mobility: Bed Mobility Overal bed mobility: +2 for physical assistance Bed Mobility: Supine to Sit Rolling:  (to Rt with RUE on rail) Sidelying to sit: Max assist, +2 for physical assistance, +2 for safety/equipment Supine to sit: Max assist, +2 for physical  assistance, HOB elevated Sit to supine: +2 for physical assistance, Total assist General bed mobility comments: Due to sternal precuaitons, pt requires increased assistance for bed mobility Transfers Overall transfer level: Needs assistance Equipment used: 2 person hand held assist Transfer via Lift Equipment: [employer] Transfers: Sit to/from Stand, [employer] Transfers Sit to Stand: Mod assist, +2 physical assistance, From elevated surface Stand pivot transfers: Mod assist, +2 physical assistance Squat pivot transfers: Mod assist, From elevated surface General transfer comment: Brought Denna Haggard but unable to even get pt positioned for attempt due to very heavy lt lean sitting EOB. Ambulation/Gait General Gait Details: unable; performed standing weight shifts Lt, Rt    ADL: ADL Overall ADL's : Needs assistance/impaired Eating/Feeding: Set up, Supervision/ safety, Cueing for safety Eating/Feeding Details (indicate cue type and reason): just resumed water and puree diet. Pt issued plate guard  - educated pt/husband and NT on use Grooming: Wash/dry face, Oral care, Set up, Supervision/safety, Sitting Upper Body Bathing: Minimal assitance, Sitting Lower Body Bathing: Maximal assistance, +2 for physical assistance, Bed level (for rolling) Upper Body Dressing : Maximal assistance Upper Body Dressing Details (indicate cue type and reason): sitting EOB Lower Body Dressing: Moderate assistance, Sit to/from stand Lower Body Dressing Details (indicate cue type and reason): Pt is able to don socks with supervision EOB.   Mod A to move sit to stand  Toilet Transfer: Moderate assistance, Stand-pivot, Squat-pivot, BSC Toilet Transfer Details (indicate cue type and reason): Pt requires cues for technique and to pivot Lt foot  Toileting- Clothing Manipulation and Hygiene: Maximal assistance, Sit to/from stand Functional mobility during ADLs:  (use of sky lift) General ADL Comments: Pt now with  sternal precautions  Cognition: Cognition Overall Cognitive Status: Impaired/Different from baseline Orientation Level: Oriented to person, Oriented to place, Oriented to time, Disoriented to situation Cognition Arousal/Alertness: Awake/alert Behavior During Therapy: Impulsive, Flat affect Overall Cognitive Status: Impaired/Different from baseline  Blood pressure 114/66, pulse 60, temperature 97.6 F (36.4 C), temperature source Oral, resp. rate 20, height 5\' 5"  (1.651 m), weight 111.1 kg (244 lb 14.9 oz), SpO2 100 %. Physical Exam  Vitals reviewed. Constitutional: She appears well-developed.  Obese  HENT:  Head: Normocephalic and atraumatic.  Eyes: EOM are normal. Right eye exhibits no discharge. Left eye exhibits no discharge.  Neck: Normal range of motion. Neck supple. No thyromegaly present.  Cardiovascular: Regular rhythm.   Bradycardia  Respiratory: No respiratory distress.  Decreased breath sounds at the bases with limited inspiratory effort +[place]  GI: Soft. Bowel sounds are normal.  Musculoskeletal:  She exhibits edema. She exhibits no tenderness.  Neurological: She is alert. A cranial nerve deficit is present.  Flat affect.  Severe dysarthria but intelligible.  He is able to provide her name and age in place.  Follows simple commands DTRs symmetric RUE/RLE: 4+/5 proximal to distal LUE/LLE: 0/5  Skin: Skin is warm and dry.  Chest incision clean dry and dressed  Psychiatric: Her affect is blunt. Her speech is delayed. She is slowed.    Results for orders placed or performed during the hospital encounter of 04/14/16 (from the past 24 hour(s))  Glucose, capillary     Status: Abnormal   Collection Time: 05/05/16 11:43 AM  Result Value Ref Range   Glucose-Capillary 135 (H) 65 - 99 mg/dL   Comment 1 Capillary Specimen    Comment 2 Notify RN   Glucose, capillary     Status: Abnormal   Collection Time: 05/05/16  3:41 PM  Result Value Ref Range   Glucose-Capillary 110  (H) 65 - 99 mg/dL   Comment 1 Capillary Specimen    Comment 2 Notify RN   Glucose, capillary     Status: Abnormal   Collection Time: 05/05/16  7:13 PM  Result Value Ref Range   Glucose-Capillary 157 (H) 65 - 99 mg/dL   Comment 1 Capillary Specimen    Comment 2 Notify RN    Comment 3 Document in Chart   Glucose, capillary     Status: Abnormal   Collection Time: 05/06/16 12:06 AM  Result Value Ref Range   Glucose-Capillary 128 (H) 65 - 99 mg/dL   Comment 1 Capillary Specimen    Comment 2 Notify RN    Comment 3 Document in Chart   Glucose, capillary     Status: Abnormal   Collection Time: 05/06/16  4:18 AM  Result Value Ref Range   Glucose-Capillary 106 (H) 65 - 99 mg/dL   Comment 1 Capillary Specimen    Comment 2 Notify RN    Comment 3 Document in Chart   Comprehensive metabolic panel     Status: Abnormal   Collection Time: 05/06/16  7:06 AM  Result Value Ref Range   Sodium 139 135 - 145 mmol/L   Potassium 3.3 (L) 3.5 - 5.1 mmol/L   Chloride 95 (L) 101 - 111 mmol/L   CO2 34 (H) 22 - 32 mmol/L   Glucose, Bld 118 (H) 65 - 99 mg/dL   BUN 13 6 - 20 mg/dL   Creatinine, Ser 0.73 0.44 - 1.00 mg/dL   Calcium 10.7 (H) 8.9 - 10.3 mg/dL   Total Protein 6.5 6.5 - 8.1 g/dL   Albumin 2.6 (L) 3.5 - 5.0 g/dL   AST 17 15 - 41 U/L   ALT 13 (L) 14 - 54 U/L   Alkaline Phosphatase 69 38 - 126 U/L   Total Bilirubin 0.6 0.3 - 1.2 mg/dL   GFR calc non Af Amer >60 >60 mL/min   GFR calc Af Amer >60 >60 mL/min   Anion gap 10 5 - 15  CBC with Differential/Platelet     Status: Abnormal   Collection Time: 05/06/16  7:06 AM  Result Value Ref Range   WBC 14.7 (H) 4.0 - 10.5 K/uL   RBC 3.78 (L) 3.87 - 5.11 MIL/uL   Hemoglobin 9.8 (L) 12.0 - 15.0 g/dL   HCT 33.7 (L) 36.0 - 46.0 %   MCV 89.2 78.0 - 100.0 fL   MCH 25.9 (L) 26.0 - 34.0 pg   MCHC 29.1 (  L) 30.0 - 36.0 g/dL   RDW 17.6 (H) 11.5 - 15.5 %   Platelets 567 (H) 150 - 400 K/uL   Neutrophils Relative % 69 %   Neutro Abs 10.1 (H) 1.7 - 7.7  K/uL   Lymphocytes Relative 16 %   Lymphs Abs 2.3 0.7 - 4.0 K/uL   Monocytes Relative 8 %   Monocytes Absolute 1.1 (H) 0.1 - 1.0 K/uL   Eosinophils Relative 8 %   Eosinophils Absolute 1.1 (H) 0.0 - 0.7 K/uL   Basophils Relative 1 %   Basophils Absolute 0.1 0.0 - 0.1 K/uL  Magnesium     Status: None   Collection Time: 05/06/16  7:06 AM  Result Value Ref Range   Magnesium 1.8 1.7 - 2.4 mg/dL  Prealbumin     Status: Abnormal   Collection Time: 05/06/16  7:06 AM  Result Value Ref Range   Prealbumin 8.9 (L) 18 - 38 mg/dL  Phosphorus     Status: None   Collection Time: 05/06/16  7:06 AM  Result Value Ref Range   Phosphorus 2.8 2.5 - 4.6 mg/dL  Protime-INR     Status: Abnormal   Collection Time: 05/06/16  7:06 AM  Result Value Ref Range   Prothrombin Time 18.3 (H) 11.6 - 15.2 seconds   INR 1.52 (H) 0.00 - 1.49  Glucose, capillary     Status: Abnormal   Collection Time: 05/06/16  8:07 AM  Result Value Ref Range   Glucose-Capillary 116 (H) 65 - 99 mg/dL   Comment 1 Notify RN    Dg Chest Port 1 View  05/05/2016  CLINICAL DATA:  60 year old female with shortness of Breath. Recent cardiothoracic surgery 04/30/2016 with complex valvuloplasty for mitral valve repair.   Initial encounter. EXAM: PORTABLE CHEST 1 VIEW COMPARISON:  05/04/2016 and earlier. FINDINGS: Portable AP semi upright view at 0629 hours. Left chest tube has been removed. No pneumothorax. Stable right IJ central line. There may be small epicardial pacer wires still in place. Stable cardiomegaly and mediastinal contours. Regressed but not resolved pulmonary vascular congestion/edema since yesterday. No large pleural effusions. No areas of worsening ventilation. IMPRESSION: 1. Left chest tube removed.  No pneumothorax. 2. Regressed pulmonary edema since yesterday. 3. No new cardiopulmonary abnormality. Electronically Signed   By: Genevie Ann M.D.   On: 05/05/2016 07:18    Assessment/Plan: Diagnosis: Debilitation after mitral valve  repair Labs and images independently reviewed.  Records reviewed and summated above.  1. Does the need for close, 24 hr/day medical supervision in concert with the patient's rehab needs make it unreasonable for this patient to be served in a less intensive setting? Yes  2. Co-Morbidities requiring supervision/potential complications: subarachnoid hemorrhage left parietal occipital region and cortical subcortical ischemic infarcts (cont therapies), dysarthria (cont SLP), dysphagia (cont SLP, advance diet as tolerated), COPD (follow RR and O2 sats with increased physical activity), splenic infarct, tachypnea (monitor RR and O2 Sats with increased physical exertion), bradycardia (follow HR with increased physical activity for appropriate response), DM (Monitor in accordance with exercise and adjust meds as necessary), hypokalemia (continue to monitor and replete as necessary), leukocytosis (cont to monitor for signs and symptoms of infection, further workup if indicated), SIRS, ABLA (transfuse if necessary to ensure appropriate perfusion for increased activity tolerance), thrombocytosis (cont to follow, likely reactive), CHF (Monitor in accordance with increased physical activity and avoid UE resistance excercises) 3. Due to bladder management, safety, skin/wound care, disease management, medication administration and patient education, does the patient  require 24 hr/day rehab nursing? Yes 4. Does the patient require coordinated care of a physician, rehab nurse, PT (1-2 hrs/day, 5 days/week), OT (1-2 hrs/day, 5 days/week) and SLP (1-2 hrs/day, 5 days/week) to address physical and functional deficits in the context of the above medical diagnosis(es)? Yes Addressing deficits in the following areas: balance, endurance, locomotion, strength, transferring, bowel/bladder control, bathing, dressing, feeding, grooming, toileting, speech, swallowing and psychosocial support 5. Can the patient actively participate in an  intensive therapy program of at least 3 hrs of therapy per day at least 5 days per week? No 6. The potential for patient to make measurable gains while on inpatient rehab is poor 7. Anticipated functional outcomes upon discharge from inpatient rehab are n/a  with PT, n/a with OT, n/a with SLP. 8. Estimated rehab length of stay to reach the above functional goals is: NA 9. Does the patient have adequate social supports and living environment to accommodate these discharge functional goals? Potentially 10. Anticipated D/C setting: SNF 11. Anticipated post D/C treatments: SNF 12. Overall Rehab/Functional Prognosis: fair  RECOMMENDATIONS: This patient's condition is appropriate for continued rehabilitative care in the following setting: Will need to clarify availability of caregiver support as discharge as pt will need significant assistance for ADLs 24/7 from mulitple caregivers.  However, pt does not appear to be able to tolerate 3 hours therapy/day.  At present, recommend SNF. Will cont to follow as medical issues stabilize. Patient has agreed to participate in recommended program. Yes Note that insurance prior authorization may be required for reimbursement for recommended care.  Comment: Rehab Admissions Coordinator to follow up.  Delice Lesch, MD 05/06/2016

## 2016-05-06 NOTE — Progress Notes (Signed)
Spoke with Dr. Roxy Manns via [place] assistant regarding my concern for inserting a PICC into the left arm of this patient.    She can not move it due to a CVA.   I expressed my concern for the development of a DVT due to immobility of the extremity.  I was not able to use the right arm due to a large scar from a previous injury she had sustained years ago.  I was instructed by Dr. Roxy Manns to insert PICC since pt was on Coumadin.     PICC was inserted via left arm without incident.  ECG technology used to verify placement.

## 2016-05-06 NOTE — Progress Notes (Addendum)
ADDENDUM Speech followed up and recommended patient start dys1 diet. Text-paged Dr. Roxy Manns- OR RN called back and said he had stated to stop TPN.  All orders, labs, and consult d/c'd- please re-consult if needed.  Oluwatosin Higginson D. Giani Betzold, PharmD, BCPS Clinical Pharmacist Pager: [phone] 05/06/2016 10:23 AM   PARENTERAL NUTRITION CONSULT NOTE  Pharmacy Consult for TPN Indication: inability to swallow, won't tolerate NGT  Allergies  Allergen Reactions  . Codeine Hives, Itching and Other (See Comments)    "breathing problems"    Patient Measurements: Height: '5\' 5"'$  (165.1 cm) Weight: 244 lb 14.9 oz (111.1 kg) IBW/kg (Calculated) : 57  Adjusted Body Weight: 70.5kg  Vital Signs: Temp: 97.6 F (36.4 C) (06/14 0700) Temp Source: Oral (06/14 0700) BP: 121/76 mmHg (06/14 0800) Pulse Rate: 55 (06/14 0800) Intake/Output from previous day: 06/13 0701 - 06/14 0700 In: 1517.4 [I.V.:1467.4; IV Piggyback:50] Out: 9417 [Urine:1185] Intake/Output from this shift: Total I/O In: 66.7 [I.V.:66.7] Out: -   Labs:  Recent Labs  05/04/16 0340 05/05/16 0400 05/06/16 0706  WBC 19.7* 15.4* 14.7*  HGB 9.0* 9.0* 9.8*  HCT 31.7* 31.5* 33.7*  PLT 399 473* 567*  INR 1.33 1.34 1.52*     Recent Labs  05/04/16 0340 05/05/16 0400 05/06/16 0706  NA 142 140 139  K 3.6 3.8 3.3*  CL 99* 97* 95*  CO2 36* 36* 34*  GLUCOSE 80 110* 118*  BUN '15 15 13  '$ CREATININE 0.64 0.74 0.73  CALCIUM 10.8* 10.7* 10.7*  MG  --   --  1.8  PHOS  --   --  2.8  PROT 6.7  --  6.5  ALBUMIN 2.8*  --  2.6*  AST 16  --  17  ALT 15  --  13*  ALKPHOS 60  --  69  BILITOT 0.6  --  0.6  PREALBUMIN  --   --  8.9*   Estimated Creatinine Clearance: 92.8 mL/min (by C-G formula based on Cr of 0.73).    Recent Labs  05/06/16 0006 05/06/16 0418 05/06/16 0807  GLUCAP 128* 106* 116*    Insulin Requirements in the past 24 hours:  6 units SSI- last received Levemir on 6/11  Admit: 43 YOF found to have splenic infarct  in 12/2015 and was started on Xarelto. In March, she had embolic CVA with associated SAH. During that admission, she was noted to have strep viridans bacteremia - TEE demonstrated mitral valve endocarditis with associated severe mitral regurgitation. 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 She was discharged for rehab before mitral valve surgery could be performed.  Surgeries/Procedures: 6/8: mitral valve repair  GI: no acute GI issues. Discussed with Dr. Roxy Manns 6/14 and he states she will never be able to tolerate a feeding tube d/t severe anxiety. Having problems swallowing d/t inability to coordinate respiration and swallow per SLP note from 6/13. Baseline albumin 8.9.  Endo: A1C 5.6 this admission, CBGs 106-157  Lytes:  K 3.3-  KCl IV runs x 5 ordered, Mag 1.8, phos 2.8, CorCa ~11.6  Renal: SCr 0.73, CrCl ~90-23m/min.   Pulm:  99/2L [place]  Cards: s/p MVR. On warfarin, ASA and Lovenox currently. Acute on chronic CHF- on Lasix and spiro, AFib on amio gtt, atenolol  Hepatobil: LFTs overall normal- Alk pho 69, TBili 0.6, AST 17, ALt low at 13, albumin low 2.6.   Neuro: significant anxiety  and history of panic disorder with associated agoraphobia. Klonopin, Lexapro and trazodone are scheduled along with PRN Haldol and Klonopin  ID: strep viridans bacteremia which resulted in endocarditis leading to valve replacement. Now with suspected PNA- on ceftazidime. WBC 14.7, afebrile  Best Practices: Lovenox + warfarin, MC  TPN Access: CVC double lumen in RIJ placed 5 days ago- per note from Dr. Roxy Manns, to place PICC and d/c old central line.  Order for PICC placement ordered 6/13 at ~1700- IV team note from 6/13 PM states that patient needs to have PICC placed in IR. Spoke with IV team 6/14 AM and they will attempt to place PICC today, and if they are  unsuccessful patient will be referred to IR TPN start date: awaiting central line access  Current Nutrition:  -NPO except for snacks of puree with meds and ice chips d/t inability to swallow  Nutritional Goals:  ~1750 kCal, 85 grams of protein per day per population estimates, will await RD recommendations  Plan:  -Once access established, will start Clinimix E 5/15 at 67m/hr + IV lipids at 186mhr- this will provide 48g protein and 1161kcal in a 24h period -add MVI and trace elements to TPN -stop IV Pepcid and add '40mg'$  to TPN *Note: TPN not yet ordered as access not established -continue SSI and CBGs q4h as ordered -full TPN labs in the morning as per protocol -await RD recommendations for goals or other methods of nutrition  Kiyoko Mcguirt D. Ader Fritze, PharmD, BCPS Clinical Pharmacist Pager: [phone]/14/2017 9:04 AM

## 2016-05-06 NOTE — Progress Notes (Signed)
Physical Therapy Treatment Patient Details Name: [name] MRN: [id number] DOB: [birth year] Today's Date: 05/06/2016    History of Present Illness Margaret [name] is a 60 y.o. female admitted with acute  CHF exacerbation.  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 Underwent MVR on 6/8.    PT Comments    Pt admitted with above diagnosis. Pt currently with functional limitations due to balance and endurance deficits. Pt was able to sit EOB with min guard assist and stand with  +2 mod assist.  Pt progressing but does vary from day to day.  Nursing is using lift to get pt up daily.  Will continue PT as pt tolerates. Pt will benefit from skilled PT to increase their independence and safety with mobility to allow discharge to the venue listed below.    Follow Up Recommendations  SNF     Equipment Recommendations  Wheelchair (measurements PT);Wheelchair cushion (measurements PT);Hospital bed (hoyer lift)    Recommendations for Other Services       Precautions / Restrictions Precautions Precautions: Fall;Sternal Precaution Comments: L hemiparesis; L neglect; L subluxed shoulder Restrictions Weight Bearing Restrictions:  (sternal precautions) LLE Weight Bearing: Weight bearing as tolerated Other Position/Activity Restrictions: sternal precautions    Mobility  Bed Mobility Overal bed mobility: Needs Assistance;+2 for physical assistance Bed Mobility: Supine to Sit   Sidelying to sit: Max assist;+2 for physical assistance;+2 for safety/equipment   Sit to supine: +2 for physical assistance;Total assist   General bed mobility comments: Due to sternal precuaitons, pt requires increased assistance for bed mobility  Transfers Overall transfer level: Needs  assistance Equipment used: 2 person hand held assist Transfers: Sit to/from Stand Sit to Stand: Mod assist;+2 physical assistance;From elevated surface         General transfer comment: Pt with BM therefore stood to be cleaned with 2 person assist to stand for 1 1/2 minutes.  then stood a second time to get pt to scoot up in bed.    Ambulation/Gait                 Stairs            Wheelchair Mobility    Modified Rankin (Stroke Patients Only) Modified Rankin (Stroke Patients Only) Pre-Morbid Rankin Score: Severe disability Modified Rankin: Severe disability     Balance Overall balance assessment: Needs assistance Sitting-balance support: Single extremity supported;Feet supported Sitting balance-Leahy Scale: Fair Sitting balance - Comments: Pt sat EOB x 10 minutes with min guard assist.     Standing balance support: Bilateral upper extremity supported;During functional activity Standing balance-Leahy Scale: Poor Standing balance comment: Needs +2 HHA to stand.                     Cognition Arousal/Alertness: Awake/alert Behavior During Therapy: Impulsive;Flat affect Overall Cognitive Status: Impaired/Different from baseline                      Exercises General Exercises - Lower Extremity Ankle Circles/Pumps: AROM;Both;10 reps;Supine Heel Slides: AROM;AAROM;Both;10 reps;Supine    General Comments        Pertinent Vitals/Pain Pain Assessment: Faces Faces Pain Scale: Hurts little more Pain Location: left UE Pain Descriptors / Indicators: Grimacing;Guarding Pain Intervention(s): Limited activity within patient's tolerance;Monitored during session;Repositioned  VSS    Home Living                      Prior Function            PT Goals (current goals can now be found in the care plan section) Progress towards PT goals: Progressing toward goals    Frequency  Min 3X/week    PT Plan Current plan remains appropriate     Co-evaluation             End of Session Equipment Utilized During Treatment: Gait belt;Oxygen Activity Tolerance: Patient limited by fatigue;Patient limited by lethargy Patient left: in bed;with call bell/phone within reach;with bed alarm set     Time: 1201-1224 PT Time Calculation (min) (ACUTE ONLY): 23 min  Charges:  $Therapeutic Exercise: 8-22 mins $Therapeutic Activity: 8-22 mins                    G Codes:      Linard Daft F May 28, 2016, 1:05 PM Jamice Carreno,PT Acute Rehabilitation [phone] (pager)

## 2016-05-07 DIAGNOSIS — I480 Paroxysmal atrial fibrillation: Secondary | ICD-10-CM

## 2016-05-07 LAB — GLUCOSE, CAPILLARY
Glucose-Capillary: 126 mg/dL — ABNORMAL HIGH (ref 65–99)
Glucose-Capillary: 131 mg/dL — ABNORMAL HIGH (ref 65–99)
Glucose-Capillary: 129 mg/dL — ABNORMAL HIGH (ref 65–99)
Glucose-Capillary: 126 mg/dL — ABNORMAL HIGH (ref 65–99)

## 2016-05-07 LAB — POCT I-STAT, CHEM 8
BUN: 13 mg/dL (ref 6–20)
Calcium, Ion: 1.35 mmol/L — ABNORMAL HIGH (ref 1.13–1.30)
Chloride: 87 mmol/L — ABNORMAL LOW (ref 101–111)
Creatinine, Ser: 0.9 mg/dL (ref 0.44–1.00)
Glucose, Bld: 156 mg/dL — ABNORMAL HIGH (ref 65–99)
HCT: 36 % (ref 36.0–46.0)
Hemoglobin: 12.2 g/dL (ref 12.0–15.0)
Potassium: 3.9 mmol/L (ref 3.5–5.1)
Sodium: 137 mmol/L (ref 135–145)
TCO2: 39 mmol/L (ref 0–100)

## 2016-05-07 LAB — CBC
HEMATOCRIT: 32.6 % — AB (ref 36.0–46.0)
Hemoglobin: 9.3 g/dL — ABNORMAL LOW (ref 12.0–15.0)
MCH: 24.9 pg — ABNORMAL LOW (ref 26.0–34.0)
MCHC: 28.5 g/dL — AB (ref 30.0–36.0)
MCV: 87.4 fL (ref 78.0–100.0)
PLATELETS: 569 10*3/uL — AB (ref 150–400)
RBC: 3.73 MIL/uL — ABNORMAL LOW (ref 3.87–5.11)
RDW: 17.9 % — AB (ref 11.5–15.5)
WBC: 14.9 10*3/uL — ABNORMAL HIGH (ref 4.0–10.5)

## 2016-05-07 LAB — BASIC METABOLIC PANEL
ANION GAP: 9 (ref 5–15)
BUN: 12 mg/dL (ref 6–20)
CHLORIDE: 91 mmol/L — AB (ref 101–111)
CO2: 36 mmol/L — AB (ref 22–32)
Calcium: 10 mg/dL (ref 8.9–10.3)
Creatinine, Ser: 0.67 mg/dL (ref 0.44–1.00)
GFR calc Af Amer: >60 mL/min (ref >60)
GLUCOSE: 178 mg/dL — AB (ref 65–99)
POTASSIUM: 3.2 mmol/L — AB (ref 3.5–5.1)
Sodium: 136 mmol/L (ref 135–145)

## 2016-05-07 LAB — PROTIME-INR
INR: 1.47 (ref 0.00–1.49)
PROTHROMBIN TIME: 17.9 s — AB (ref 11.6–15.2)

## 2016-05-07 MED ORDER — AMIODARONE HCL IN DEXTROSE 360-4.14 MG/200ML-% IV SOLN
30.0000 mg/h | INTRAVENOUS | Status: DC
  Administered 2016-05-07 – 2016-05-10 (×8): 30 mg/h via INTRAVENOUS
  Filled 2016-05-07 (×8): qty 200

## 2016-05-07 MED ORDER — WARFARIN SODIUM 3 MG PO TABS
4.0000 mg | ORAL_TABLET | Freq: Every day | ORAL | Status: DC
  Administered 2016-05-07: 4 mg via ORAL
  Filled 2016-05-07: qty 0.5

## 2016-05-07 MED ORDER — AMIODARONE LOAD VIA INFUSION
150.0000 mg | Freq: Once | INTRAVENOUS | Status: AC
  Administered 2016-05-07: 150 mg via INTRAVENOUS
  Filled 2016-05-07: qty 83.34

## 2016-05-07 MED ORDER — POTASSIUM CHLORIDE 10 MEQ/50ML IV SOLN
INTRAVENOUS | Status: AC
  Filled 2016-05-07: qty 100

## 2016-05-07 MED ORDER — AMIODARONE IV BOLUS ONLY 150 MG/100ML
150.0000 mg | Freq: Once | INTRAVENOUS | Status: AC
  Administered 2016-05-07: 150 mg via INTRAVENOUS

## 2016-05-07 MED ORDER — POTASSIUM CHLORIDE 10 MEQ/50ML IV SOLN
10.0000 meq | INTRAVENOUS | Status: AC
  Administered 2016-05-07 (×3): 10 meq via INTRAVENOUS
  Filled 2016-05-07: qty 50

## 2016-05-07 MED ORDER — POTASSIUM CHLORIDE 10 MEQ/50ML IV SOLN
10.0000 meq | INTRAVENOUS | Status: AC
  Administered 2016-05-07 – 2016-05-08 (×2): 10 meq via INTRAVENOUS

## 2016-05-07 MED ORDER — AMIODARONE HCL 200 MG PO TABS
200.0000 mg | ORAL_TABLET | Freq: Two times a day (BID) | ORAL | Status: DC
  Administered 2016-05-07: 200 mg via ORAL
  Filled 2016-05-07: qty 1

## 2016-05-07 MED ORDER — POTASSIUM CHLORIDE 10 MEQ/50ML IV SOLN
10.0000 meq | INTRAVENOUS | Status: AC
  Administered 2016-05-07 (×2): 10 meq via INTRAVENOUS
  Filled 2016-05-07 (×2): qty 50

## 2016-05-07 MED ORDER — SODIUM CHLORIDE 0.9 % IV SOLN
INTRAVENOUS | Status: DC
  Administered 2016-05-09 – 2016-05-11 (×2): via INTRAVENOUS

## 2016-05-07 MED ORDER — GERHARDT'S BUTT CREAM
TOPICAL_CREAM | CUTANEOUS | Status: DC | PRN
  Filled 2016-05-07: qty 1

## 2016-05-07 NOTE — Progress Notes (Addendum)
SLP Cancellation Note  Patient Details Name: ALNA EGGERT MRN: [id number] DOB: June 28, 1956   Cancelled treatment:       Reason Eval/Treat Not Completed: Other (comment);Fatigue/lethargy limiting ability to participate  SLP went by pt's room at 8 am and she was sound asleep.  Will continue efforts.    Luanna Salk, [facility] SLP [phone]

## 2016-05-07 NOTE — Progress Notes (Signed)
Pt went back into a fib. Notified Dr. Roxy Manns. New orders to give amio bolus and start her on an amio gtt at 56. Will continue to monitor closely.

## 2016-05-07 NOTE — Progress Notes (Addendum)
[address]       [place] [address]             [phone]        CARDIOTHORACIC SURGERY PROGRESS NOTE   R7 Days Post-Op Procedure(s) (LRB): MITRAL VALVE REPAIR  (N/A) TRANSESOPHAGEAL ECHOCARDIOGRAM (TEE) (N/A)  Subjective: Looks good.  Had a very good night and slept most of the night.  Objective: Vital signs: BP Readings from Last 1 Encounters:  05/07/16 103/71   Pulse Readings from Last 1 Encounters:  05/07/16 50   Resp Readings from Last 1 Encounters:  05/07/16 18   Temp Readings from Last 1 Encounters:  05/07/16 97.6 F (36.4 C) Oral    Hemodynamics:    Physical Exam:  Rhythm:   sinus  Breath sounds: Scattered rhonchi  Heart sounds:  RRR w/out murmur  Incisions:  Clean and dry  Abdomen:  Soft, non-distended, non-tender  Extremities:  Warm, well-perfused    Intake/Output from previous day: 06/14 0701 - 06/15 0700 In: 873.8 [P.O.:150; I.V.:473.8; IV Piggyback:250] Out: -  Intake/Output this shift:    Lab Results:  CBC: Recent Labs  05/06/16 0706 05/07/16 0450  WBC 14.7* 14.9*  HGB 9.8* 9.3*  HCT 33.7* 32.6*  PLT 567* 569*    BMET:  Recent Labs  05/05/16 0400 05/06/16 0706  NA 140 139  K 3.8 3.3*  CL 97* 95*  CO2 36* 34*  GLUCOSE 110* 118*  BUN 15 13  CREATININE 0.74 0.73  CALCIUM 10.7* 10.7*     PT/INR:   Recent Labs  05/07/16 0450  LABPROT 17.9*  INR 1.47    CBG (last 3)   Recent Labs  05/06/16 1200 05/06/16 1614 05/06/16 2135  GLUCAP 124* 140* 164*    ABG    Component Value Date/Time   PHART 7.343* 05/02/2016 0345   PCO2ART 60.3* 05/02/2016 0345   PO2ART 112* 05/02/2016 0345   HCO3 31.9* 05/02/2016 0345   TCO2 35 05/03/2016 1651   O2SAT 69.6 05/03/2016 0423    CXR: n/a  Assessment/Plan: S/P Procedure(s) (LRB): MITRAL VALVE REPAIR  (N/A) TRANSESOPHAGEAL ECHOCARDIOGRAM (TEE) (N/A)  Overall doing well POD7 Post op Afib, maintaining NSR on IV amiodarone BP stable Breathing  comfortably w/ O2 sats 98-100% on 2 L/min via [place] Acute on chronic diastolic CHF with expected post-op volume excess, I/O's + 874 mL yesterday and weight stable, still 8-10 lbs above pre-op baseline Expected post op acute blood loss anemia, Hgb stable 9.3 Expected post op atelectasis, mild Post op leukocytosis w/out fever, WBC stable 14.9k today - Day #6 of 7 IV Fortaz for possible HCAP Strep viridans endocarditis - resolved - pre-op follow up blood cultures off antibiotics negative and no signs of active infection at time of mitral valve repair Pulmonary hypertension Obesity hypoventilation syndrome Type II diabetes mellitus, CBG's well controlled last 12-24 hrs Morbid obesity 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 Splenic infarct Severe anxiety and depression Panic attacks   Convert amiodarone to oral  Continue low dose lovenox until INR > 1.8  Coumadin - increase to 4 mg today  Continue bid lasix and spironolactone  Mobilize  Continue PT/OT  Potentially ready for d/c to inpatient rehab service 1-2 days - I have previously had several discussions with husband and family regarding long-term expectations and they have expressed understanding that she will likely still require 24 hr/day care indefinitely - they hope to take her home  within the next month - will try to meet with them again today  Rexene Alberts, MD 05/07/2016 7:38 AM

## 2016-05-07 NOTE — Progress Notes (Signed)
Occupational Therapy Treatment Patient Details Name: Margaret [name] MRN: [id number] DOB: [birth year] Today's Date: 05/07/2016    History of present illness Margaret [name] is a 60 y.o. female admitted with acute  CHF exacerbation.  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 Underwent MVR on 6/8.   OT comments  This 60 yo female admitted with above presents to acute OT with making slow progress, but trying anything we ask of her. She will continue to benefit from acute OT with follow up OT on CIR to get to there highest level of independence and decreased burden of care.  Follow Up Recommendations  Supervision/Assistance - 24 hour;CIR    Equipment Recommendations  Wheelchair (measurements OT);Wheelchair cushion (measurements OT);Hospital bed;3 in 1 bedside comode;Other (comment) (hoyer)    Recommendations for Other Services Rehab consult    Precautions / Restrictions Precautions Precautions: Fall;Sternal Precaution Comments: L hemiparesis; L neglect; L subluxed shoulder Required Braces or Orthoses: Other Brace/Splint (left shoulder taped) Restrictions Weight Bearing Restrictions: Yes Other Position/Activity Restrictions: sternal precautions       Mobility Bed Mobility Overal bed mobility: Needs Assistance;+2 for physical assistance Bed Mobility: Sit to Supine;Rolling Rolling:  (rolls left with min A, but total to roll right)     Sit to supine: +2 for physical assistance;Total assist      Transfers Overall transfer level: Needs assistance Equipment used: 2 person hand held assist Transfers: Sit to/from [employer] Transfers Sit to Stand: Mod assist;+2 physical assistance Stand pivot transfers: Max assist;+2 physical assistance       General  transfer comment: with use of belt and pad under her for stand pivot. Also tried transfer board with pt being +2 total A for transfer board to her left    Balance Overall balance assessment: Needs assistance Sitting-balance support: Single extremity supported;Feet supported Sitting balance-Leahy Scale: Fair     Standing balance support: Single extremity supported;During functional activity   Standing balance comment: requires +2 to stand HHA on RUE and LUE support                   ADL Overall ADL's : Needs assistance/impaired                         Toilet Transfer: +2 for physical assistance;Maximal assistance;Stand-pivot Toilet Transfer Details (indicate cue type and reason): recliner>to W/C                     Perception Perception Spatial deficits: L neglect;R bias       Cognition   Behavior During Therapy: Impulsive Overall Cognitive Status: Impaired/Different from baseline Area of Impairment: Attention;Following commands;Problem solving   Current Attention Level: Sustained (however gets easily distracted)    Following Commands: Follows one step commands consistently (with occassional A to re-direct due to distraction)     Problem Solving: Slow processing;Difficulty sequencing;Requires verbal cues;Requires tactile cues        Exercises Other Exercises Other Exercises: Pt and husband aware that they were told to perform retrograde massage, and prop her LUE up on 2 pillows. Pt reports that LUE feels much better with taping           Pertinent Vitals/ Pain       Pain Assessment: No/denies  pain         Frequency Min 2X/week     Progress Toward Goals  OT Goals(current goals can now be found in the care plan section)  Progress towards OT goals: Progressing toward goals (for transfers, and LUE care)     Plan Discharge plan remains appropriate    Co-evaluation    PT/OT/SLP Co-Evaluation/Treatment: Yes Reason for Co-Treatment:  Complexity of the patient's impairments (multi-system involvement);For patient/therapist safety   OT goals addressed during session: Strengthening/ROM      End of Session Equipment Utilized During Treatment: Oxygen;Gait belt   Activity Tolerance Patient tolerated treatment well   Patient Left in bed;with call bell/phone within reach   Nurse Communication          Time: [id number] OT Time Calculation (min): 44 min  Charges: OT General Charges $OT Visit: 1 Procedure OT Treatments $Therapeutic Activity: 23-37 mins  Almon Register [id number] 05/07/2016, 12:06 PM

## 2016-05-07 NOTE — Progress Notes (Signed)
Physical Therapy Treatment Patient Details Name: Margaret [name] MRN: [id number] DOB: 01-23-1956 Today's Date: 05/07/2016    History of Present Illness Margaret [name] is a 60 y.o. female admitted with acute  CHF exacerbation.  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 Underwent MVR on 6/8.    PT Comments    Pt admitted with above diagnosis. Pt currently with functional limitations due to balance strength and endurance deficits. Pt needs assist for all types of transfers with greatest limitation being the sternal precautions.  CIR to try and find out how long pt is to have strict sternal precautions.  CIR also to check with husband regarding ramp that he needs to build.  Feel that pt is a good candidate for Rehab for education regarding transfer techniques and other education regarding mobility.  Pt is very motivated and husband supportive. Will continue acute PT.   Pt will benefit from skilled PT to increase their independence and safety with mobility to allow discharge to the venue listed below.    Follow Up Recommendations  CIR;Supervision/Assistance - 24 hour     Equipment Recommendations  Wheelchair (measurements PT);Wheelchair cushion (measurements PT);Hospital bed (hoyer lift)    Recommendations for Other Services Rehab consult     Precautions / Restrictions Precautions Precautions: Fall;Sternal Precaution Comments: L hemiparesis; L neglect; L subluxed shoulder Required Braces or Orthoses: Other Brace/Splint (left shoulder taped) Restrictions Weight Bearing Restrictions: Yes Other Position/Activity Restrictions: sternal precautions    Mobility  Bed Mobility Overal bed mobility: Needs Assistance;+2 for physical assistance Bed Mobility: Sit to  Supine;Rolling Rolling:  (rolls left with min A, but total to roll right)     Sit to supine: +2 for physical assistance;Total assist   General bed mobility comments: Due to sternal precuaitons, pt requires increased assistance for bed mobility  Transfers Overall transfer level: Needs assistance Equipment used: 2 person hand held assist Transfers: Sit to/from [employer];Lateral/Scoot Transfers Sit to Stand: Mod assist;+2 physical assistance Stand pivot transfers: Max assist;+2 physical assistance      Lateral/Scoot Transfers: Total assist;+2 physical assistance;With slide board General transfer comment: with use of belt and pad under her for stand pivot.  Also tried sliding board with pt total assist with transfer to pts' left.   Ambulation/Gait                 Stairs            Wheelchair Mobility    Modified Rankin (Stroke Patients Only)       Balance Overall balance assessment: Needs assistance Sitting-balance support: Single extremity supported;Feet supported Sitting balance-Leahy Scale: Fair Sitting balance - Comments: pt sat EOB with 5 minutes with min guard assist.   Standing balance support: Single extremity supported;During functional activity Standing balance-Leahy Scale: Poor Standing balance comment: requires +2 to stand with HHA on right UE and LUE support.                    Cognition Arousal/Alertness: Awake/alert Behavior During Therapy: Impulsive Overall Cognitive Status: Impaired/Different from baseline Area of Impairment: Attention;Following commands;Problem solving   Current Attention Level: Sustained (however gets easily distracted)   Following Commands: Follows one step commands consistently (with occassional A to re-direct due to distraction)     Problem Solving: Slow  processing;Difficulty sequencing;Requires verbal cues;Requires tactile cues      Exercises Other Exercises Other Exercises: Pt and  husband aware that they were told to perform retrograde massage, and prop her LUE up on 2 pillows. Pt reports that LUE feels much better with taping    General Comments General comments (skin integrity, edema, etc.): Spoke with Barbaraann Rondo and [place] from [place].  Pt really requires education for transfers with hoyer lift as well as ed for wheelchair management.  Husband wants to take pt home.  Husband needs to get ramp built and CIR is aware.        Pertinent Vitals/Pain Pain Assessment: No/denies pain  VSS    Home Living                      Prior Function            PT Goals (current goals can now be found in the care plan section) Progress towards PT goals: Progressing toward goals    Frequency  Min 3X/week    PT Plan Current plan remains appropriate    Co-evaluation PT/OT/SLP Co-Evaluation/Treatment: Yes Reason for Co-Treatment: Complexity of the patient's impairments (multi-system involvement) PT goals addressed during session: Mobility/safety with mobility OT goals addressed during session: Strengthening/ROM     End of Session Equipment Utilized During Treatment: Gait belt;Oxygen Activity Tolerance: Patient limited by fatigue;Patient limited by lethargy Patient left: in bed;with call bell/phone within reach;with bed alarm set     Time: [id number] PT Time Calculation (min) (ACUTE ONLY): 56 min  Charges:  $Therapeutic Activity: 23-37 mins                    G CodesDenice Paradise 05-21-16, 2:58 PM [employer] Acute Rehabilitation [phone] (pager)

## 2016-05-07 NOTE — Progress Notes (Signed)
Patient Name: Margaret [name] Date of Encounter: 05/07/2016  Principal Problem:   S/P mitral valve repair Active Problems:   Essential hypertension   Endocarditis of mitral valve   Mitral regurgitation   HLD (hyperlipidemia)   Type 2 diabetes mellitus with circulatory disorder (HCC)   History of stroke   Acute on chronic diastolic congestive heart failure due to valvular disease ([place])   CHF due to valvular disease, acute on chronic, diastolic   Other depression due to general medical condition   Panic disorder without agoraphobia with severe panic attacks   History of subarachnoid hemorrhage   SOB (shortness of breath)   History of CVA with residual deficit   Dysarthria, post-stroke   Chronic obstructive pulmonary disease (HCC)   Tachypnea   Bradycardia   Hypokalemia   Leukocytosis   SIRS (systemic inflammatory response syndrome) (HCC)   Acute blood loss anemia   Thrombocytosis (HCC)   Length of Stay: 23  SUBJECTIVE  The patient is looking much better today, she is even smiling, denies any chest pain or shortness of breath, working slowly with a physical therapist.  CURRENT MEDS . amiodarone  150 mg Intravenous Once  . amiodarone  200 mg Oral BID PC  . antiseptic oral rinse  7 mL Mouth Rinse BID  . aspirin EC  81 mg Oral Daily  . atenolol  12.5 mg Oral BID  . bisacodyl  10 mg Oral Daily   Or  . bisacodyl  10 mg Rectal Daily  . cefTAZidime (FORTAZ)  IV  1 g Intravenous Q8H  . clonazePAM  0.5 mg Oral QHS  . docusate sodium  200 mg Oral Daily  . enoxaparin (LOVENOX) injection  40 mg Subcutaneous Q24H  . escitalopram  10 mg Oral QHS  . famotidine (PEPCID) IV  20 mg Intravenous Q12H  . furosemide  80 mg Intravenous BID  . insulin aspart  0-20 Units Subcutaneous TID WC  . nystatin   Topical BID  . potassium chloride  10 mEq Intravenous Q1 Hr x 3  . sodium chloride flush  10-40 mL Intracatheter Q12H  . sodium chloride flush  3 mL Intravenous Q12H  .  spironolactone  50 mg Oral Daily  . traZODone  100 mg Oral QHS  . warfarin  4 mg Oral q1800  . Warfarin - Physician Dosing Inpatient   Does not apply q1800   . sodium chloride 10 mL/hr at 05/06/16 2100  . amiodarone      OBJECTIVE  Filed Vitals:   05/07/16 1015 05/07/16 1105 05/07/16 1119 05/07/16 1200  BP: 96/73 107/77  99/67  Pulse: 56 58  55  Temp:   97.7 F (36.5 C)   TempSrc:   Oral   Resp: 12 19  22   Height:      Weight:      SpO2: 100% 94%  98%    Intake/Output Summary (Last 24 hours) at 05/07/16 1242 Last data filed at 05/07/16 1152  Gross per 24 hour  Intake 655.97 ml  Output      0 ml  Net 655.97 ml   Filed Weights   05/05/16 0111 05/06/16 0500 05/07/16 0500  Weight: 246 lb 7.6 oz (111.8 kg) 244 lb 14.9 oz (111.1 kg) 244 lb 11.4 oz (111 kg)    PHYSICAL EXAM  General: AAOx3 Neuro: Moves all extremities spontaneously. HEENT:  Normal  Neck: Supple without bruits or JVD. Lungs:  Resp regular and unlabored, mild rales at both bases Heart:  RRR no s3, s4, or murmurs. Abdomen: Soft, non-tender, non-distended, BS + x 4.  Extremities: No clubbing, cyanosis or edema. DP/PT/Radials 2+ and equal bilaterally.  Accessory Clinical Findings  CBC  Recent Labs  05/06/16 0706 05/07/16 0450  WBC 14.7* 14.9*  NEUTROABS 10.1*  --   HGB 9.8* 9.3*  HCT 33.7* 32.6*  MCV 89.2 87.4  PLT 567* [id number]*   Basic Metabolic Panel  Recent Labs  05/06/16 0706 05/07/16 0800  NA 139 136  K 3.3* 3.2*  CL 95* 91*  CO2 34* 36*  GLUCOSE 118* 178*  BUN 13 12  CREATININE 0.73 0.67  CALCIUM 10.7* 10.0  MG 1.8  --   PHOS 2.8  --    Liver Function Tests  Recent Labs  05/06/16 0706  AST 17  ALT 13*  ALKPHOS 69  BILITOT 0.6  PROT 6.5  ALBUMIN 2.6*   Radiology/Studies  Dg Chest Port 1 View  05/04/2016  CLINICAL DATA:  Shortness of breath.  Chest tube placement. EXAM: PORTABLE CHEST 1 VIEW COMPARISON:  05/03/2016. FINDINGS: Right IJ line in stable position. Interim  removal of right chest tube. Left chest tube in stable position. No pneumothorax. Prior CABG with bilateral diffuse pulmonary infiltrates consistent with persistent pulmonary edema, slightly increased from prior exam. Small pleural effusions cannot be excluded. IMPRESSION: 1. Interim removal of right chest tube. Left chest tube in stable position. No pneumothorax. Right IJ line stable position. 2. Prior CABG. Cardiomegaly with diffuse bilateral pulmonary infiltrates consistent with pulmonary edema again noted. Pulmonary edema has progressed slightly from prior exam. Small pleural effusions cannot be excluded . Electronically Signed   By: Rosana Fret: a-fib with RVR from 6:30 till 7:30 this am, then intermittent pacing, now SR    ASSESSMENT AND PLAN  61 year old post MV Repair on 04/30/16  1. PAF with RVR, she had a very short run of atrial fibrillation this morning, I agree with switching to oral amiodarone now that she is able to swallow pills.   2. Acute on chronic diastolic CHF - responded well to iv diuresis, Crea 0.8, still fluid overloaded,continue current lasix 80 mg iv BID, K 3.2, we'll replace.  3. Hypertension - she is rather hypotensive, will continue the same management for now but eventual cutdown on amount of diuretics.  4. S/P MV repair, we will order a post op echo  Signed, Ena Dawley MD, [facility] 05/07/2016

## 2016-05-07 NOTE — Progress Notes (Signed)
[facility] BRIEF SICU PROGRESS NOTE  7 Days Post-Op  S/P Procedure(s) (LRB): MITRAL VALVE REPAIR  (N/A) TRANSESOPHAGEAL ECHOCARDIOGRAM (TEE) (N/A)   Went back into rapid Afib today Now back on IV amiodarone, still in Afib w/ improved HR Otherwise had a good day  Plan: Continue amiodarone IV for now, rebolus.  Supplement potassium  Rexene Alberts, MD 05/07/2016 8:08 PM

## 2016-05-07 NOTE — Progress Notes (Signed)
I met with pt and her spouse at bedside. We discussed his ability to provide for her physically at home after any rehab. Pt has been at [facility] since her d/c from [facility] 3/17 after a CVA. Spouse states he has had  Recent anuerysm surgery and continues with equilibrium problems. He has a local son that can offer limited assistance. 5 step entry into home with need for a ramp which we discussed. I will discuss with our rehab team and have arranged to meet with pt and her spouse tomorrow at 9 am to follow up on these discussions. [phone]

## 2016-05-07 NOTE — Progress Notes (Signed)
I have discussed with PT and OT and Inpt rehab therapy supervisor pt's therapy progress. Have left a message for pt's spouse to contact me so that we can discuss with his wife her rehab venue options. [phone]

## 2016-05-08 ENCOUNTER — Inpatient Hospital Stay (HOSPITAL_COMMUNITY): Payer: Medicaid Other

## 2016-05-08 LAB — GLUCOSE, CAPILLARY
Glucose-Capillary: 145 mg/dL — ABNORMAL HIGH (ref 65–99)
Glucose-Capillary: 110 mg/dL — ABNORMAL HIGH (ref 65–99)
Glucose-Capillary: 124 mg/dL — ABNORMAL HIGH (ref 65–99)
Glucose-Capillary: 173 mg/dL — ABNORMAL HIGH (ref 65–99)

## 2016-05-08 LAB — CBC
HCT: 34.5 % — ABNORMAL LOW (ref 36.0–46.0)
Hemoglobin: 9.8 g/dL — ABNORMAL LOW (ref 12.0–15.0)
MCH: 25.3 pg — ABNORMAL LOW (ref 26.0–34.0)
MCHC: 28.4 g/dL — ABNORMAL LOW (ref 30.0–36.0)
MCV: 89.1 fL (ref 78.0–100.0)
PLATELETS: 617 10*3/uL — AB (ref 150–400)
RBC: 3.87 MIL/uL (ref 3.87–5.11)
RDW: 18 % — AB (ref 11.5–15.5)
WBC: 16.7 10*3/uL — AB (ref 4.0–10.5)

## 2016-05-08 LAB — BASIC METABOLIC PANEL
Anion gap: 5 (ref 5–15)
BUN: 10 mg/dL (ref 6–20)
CO2: 39 mmol/L — ABNORMAL HIGH (ref 22–32)
CREATININE: 0.79 mg/dL (ref 0.44–1.00)
Calcium: 10.3 mg/dL (ref 8.9–10.3)
Chloride: 94 mmol/L — ABNORMAL LOW (ref 101–111)
Glucose, Bld: 129 mg/dL — ABNORMAL HIGH (ref 65–99)
Potassium: 4 mmol/L (ref 3.5–5.1)
SODIUM: 138 mmol/L (ref 135–145)

## 2016-05-08 LAB — PROTIME-INR
INR: 1.52 — AB (ref 0.00–1.49)
Prothrombin Time: 18.3 seconds — ABNORMAL HIGH (ref 11.6–15.2)

## 2016-05-08 LAB — BRAIN NATRIURETIC PEPTIDE: B NATRIURETIC PEPTIDE 5: 723.6 pg/mL — AB (ref 0.0–100.0)

## 2016-05-08 MED ORDER — WARFARIN SODIUM 5 MG PO TABS
5.0000 mg | ORAL_TABLET | Freq: Every day | ORAL | Status: DC
  Administered 2016-05-08 – 2016-05-09 (×2): 5 mg via ORAL
  Filled 2016-05-08 (×2): qty 1

## 2016-05-08 MED ORDER — CHLORHEXIDINE GLUCONATE 0.12 % MT SOLN
15.0000 mL | Freq: Two times a day (BID) | OROMUCOSAL | Status: DC
  Administered 2016-05-08 – 2016-05-12 (×6): 15 mL via OROMUCOSAL
  Filled 2016-05-08 (×5): qty 15

## 2016-05-08 MED ORDER — CETYLPYRIDINIUM CHLORIDE 0.05 % MT LIQD
7.0000 mL | Freq: Two times a day (BID) | OROMUCOSAL | Status: DC
  Administered 2016-05-09 – 2016-05-12 (×4): 7 mL via OROMUCOSAL

## 2016-05-08 MED ORDER — FUROSEMIDE 10 MG/ML IJ SOLN: 80.0000 mg | Freq: Two times a day (BID) | INTRAMUSCULAR | Status: DC

## 2016-05-08 MED ORDER — FUROSEMIDE 10 MG/ML IJ SOLN
80.0000 mg | Freq: Once | INTRAMUSCULAR | Status: DC
Start: 2016-05-08 — End: 2016-05-08

## 2016-05-08 MED ORDER — FUROSEMIDE 10 MG/ML IJ SOLN
10.0000 mg/h | INTRAVENOUS | Status: DC
  Administered 2016-05-08: 10 mg/h via INTRAVENOUS
  Filled 2016-05-08 (×5): qty 25

## 2016-05-08 MED ORDER — FUROSEMIDE 10 MG/ML IJ SOLN: 80.0000 mg | Freq: Once | INTRAMUSCULAR | Status: DC

## 2016-05-08 NOTE — Progress Notes (Signed)
Patient Name: CHARLI CARAKER Date of Encounter: 05/08/2016  Principal Problem:   S/P mitral valve repair Active Problems:   Essential hypertension   Endocarditis of mitral valve   Mitral regurgitation   HLD (hyperlipidemia)   Type 2 diabetes mellitus with circulatory disorder (HCC)   History of stroke   Acute on chronic diastolic congestive heart failure due to valvular disease ([place])   CHF due to valvular disease, acute on chronic, diastolic   Other depression due to general medical condition   Panic disorder without agoraphobia with severe panic attacks   History of subarachnoid hemorrhage   SOB (shortness of breath)   History of CVA with residual deficit   Dysarthria, post-stroke   Chronic obstructive pulmonary disease (HCC)   Tachypnea   Bradycardia   Hypokalemia   Leukocytosis   SIRS (systemic inflammatory response syndrome) (HCC)   Acute blood loss anemia   Thrombocytosis ([place])   Length of Stay: 24  SUBJECTIVE  The patient is looking much better today, sitting in chair smiling.   CURRENT MEDS  . antiseptic oral rinse  7 mL Mouth Rinse BID  . aspirin EC  81 mg Oral Daily  . atenolol  12.5 mg Oral BID  . bisacodyl  10 mg Oral Daily   Or  . bisacodyl  10 mg Rectal Daily  . clonazePAM  0.5 mg Oral QHS  . docusate sodium  200 mg Oral Daily  . enoxaparin (LOVENOX) injection  40 mg Subcutaneous Q24H  . escitalopram  10 mg Oral QHS  . famotidine (PEPCID) IV  20 mg Intravenous Q12H  . insulin aspart  0-20 Units Subcutaneous TID WC  . nystatin   Topical BID  . sodium chloride flush  10-40 mL Intracatheter Q12H  . sodium chloride flush  3 mL Intravenous Q12H  . spironolactone  50 mg Oral Daily  . traZODone  100 mg Oral QHS  . warfarin  5 mg Oral q1800  . Warfarin - Physician Dosing Inpatient   Does not apply q1800   . sodium chloride 10 mL/hr at 05/08/16 0200  . amiodarone 30 mg/hr (05/08/16 1152)  . furosemide (LASIX) infusion 10 mg/hr (05/08/16 1500)      OBJECTIVE  Filed Vitals:   05/08/16 0800 05/08/16 0900 05/08/16 1000 05/08/16 1100  BP: 102/62 114/70 105/89 87/53  Pulse: 51 49  51  Temp:  97.1 F (36.2 C)    TempSrc:  Axillary    Resp: 27 20 23 21   Height:      Weight:      SpO2: 97% 97% 95% 97%    Intake/Output Summary (Last 24 hours) at 05/08/16 1118 Last data filed at 05/08/16 1100  Gross per 24 hour  Intake 1031.58 ml  Output      0 ml  Net 1031.58 ml   Filed Weights   05/05/16 0111 05/06/16 0500 05/07/16 0500  Weight: 246 lb 7.6 oz (111.8 kg) 244 lb 14.9 oz (111.1 kg) 244 lb 11.4 oz (111 kg)    PHYSICAL EXAM  General: AAOx3 Neuro: Moves all extremities spontaneously. HEENT:  Normal  Neck: Supple without bruits or JVD. Lungs:  Resp regular and unlabored, mild rales at both bases Heart: RRR no s3, s4, or murmurs. Abdomen: Soft, non-tender, non-distended, BS + x 4.  Extremities: No clubbing, cyanosis or edema. DP/PT/Radials 2+ and equal bilaterally.  Accessory Clinical Findings  CBC  Recent Labs  05/06/16 0706 05/07/16 0450 05/07/16 2219 05/08/16 0516  WBC 14.7* 14.9*  --  16.7*  NEUTROABS 10.1*  --   --   --   HGB 9.8* 9.3* 12.2 9.8*  HCT 33.7* 32.6* 36.0 34.5*  MCV 89.2 87.4  --  89.1  PLT 567* 569*  --  [id number]*   Basic Metabolic Panel  Recent Labs  05/06/16 0706 05/07/16 0800 05/07/16 2219 05/08/16 0516  NA 139 136 137 138  K 3.3* 3.2* 3.9 4.0  CL 95* 91* 87* 94*  CO2 34* 36*  --  39*  GLUCOSE 118* 178* 156* 129*  BUN 13 12 13 10   CREATININE 0.73 0.67 0.90 0.79  CALCIUM 10.7* 10.0  --  10.3  MG 1.8  --   --   --   PHOS 2.8  --   --   --    Liver Function Tests  Recent Labs  05/06/16 0706  AST 17  ALT 13*  ALKPHOS 69  BILITOT 0.6  PROT 6.5  ALBUMIN 2.6*   Radiology/Studies  Dg Chest Port 1 View  05/04/2016  CLINICAL DATA:  Shortness of breath.  Chest tube placement. EXAM: PORTABLE CHEST 1 VIEW COMPARISON:  05/03/2016. FINDINGS: Right IJ line in stable position.  Interim removal of right chest tube. Left chest tube in stable position. No pneumothorax. Prior CABG with bilateral diffuse pulmonary infiltrates consistent with persistent pulmonary edema, slightly increased from prior exam. Small pleural effusions cannot be excluded. IMPRESSION: 1. Interim removal of right chest tube. Left chest tube in stable position. No pneumothorax. Right IJ line stable position. 2. Prior CABG. Cardiomegaly with diffuse bilateral pulmonary infiltrates consistent with pulmonary edema again noted. Pulmonary edema has progressed slightly from prior exam. Small pleural effusions cannot be excluded . Electronically Signed   By: Rosana Fret: a-fib with RVR from 6:30 till 7:30 this am, then intermittent pacing, now SR    ASSESSMENT AND PLAN  60 year old post MV Repair on 04/30/16  1. PAF with RVR, she had a very short run of atrial fibrillation yesterday, none since then, she can be switched to PO amiodarone   2. Acute on chronic diastolic CHF - responded well to iv diuresis, Crea 0.8, still fluid overloaded,continue lasix infusion, K now 4.0  3. Hypertension - she is now hypotensive and bradycardic, I would hold atenolol   4. S/P MV repair, we will order a post op echo  She will be transferred to the inpatient rehab today.   Signed, Ena Dawley MD, [facility] 05/08/2016

## 2016-05-08 NOTE — Progress Notes (Signed)
Patient ID: Margaret [name], female   DOB: 1956/03/17, 60 y.o.   MRN: [id number]  SICU Evening Rounds:  She is hemodynamically stable with soft BP, a little better this afternoon.  Maintaining sinus brady 54 on IV amio today  Sats 97%  Lasix drip started. She is incontinent and using diaper so will have to follow weight.  OOB with assist today.   Continue present course.

## 2016-05-08 NOTE — Discharge Summary (Signed)
Physician Discharge Summary  Patient ID: ILDIKO TISSOT MRN: [id number] DOB/AGE: 23-Jun-1956 60 y.o.  Admit date: 04/14/2016 Discharge date: 05/12/2016  Admission Diagnoses:  Patient Active Problem List   Diagnosis Date Noted  . History of subarachnoid hemorrhage   . SOB (shortness of breath)   . History of CVA with residual deficit   . Dysarthria, post-stroke   . Chronic obstructive pulmonary disease ([place])   . Tachypnea   . Bradycardia   . Hypokalemia   . Leukocytosis   . SIRS (systemic inflammatory response syndrome) (HCC)   . Acute blood loss anemia   . Thrombocytosis ([place])   . 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 Acute on chronic diastolic congestive heart failure due to valvular disease ([place]) 04/14/2016  . CHF due to valvular disease, acute on chronic, diastolic [id number]  . Chronic diastolic congestive heart failure due to valvular disease ([place]) 04/13/2016  . History of stroke 04/13/2016  . Cerebrovascular accident (CVA) due to occlusion of right carotid artery ([place]) 03/25/2016  . Splenic infarct 03/25/2016  . Type 2 diabetes mellitus with circulatory disorder ([place]) 03/25/2016  . Mitral regurgitation 03/06/2016  . HLD (hyperlipidemia) 03/06/2016  . Right carotid artery occlusion   . Epigastric abdominal tenderness on direct palpation   . Endocarditis of mitral valve   . Positive ANA (antinuclear antibody)   . Streptococcus viridans infection 02/04/2016  . Cerebrovascular accident (CVA) due to embolism of cerebral artery ([place])   . Subarachnoid hemorrhage ([place]) 02/01/2016  . Stroke ([place])   . Visual changes   . Pulmonary infiltrates 01/23/2016  . Pulmonary hypertension assoc with unclear multi-factorial mechanisms ([place]) 01/23/2016  . 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 Insulin dependent diabetes mellitus ([place]) 01/14/2016  . Essential hypertension 01/14/2016  . Hepatic  cirrhosis ([place]) 01/14/2016  . COPD (chronic obstructive pulmonary disease) ([place]) 04/27/2012  . Chronic diastolic CHF (congestive heart failure) ([place]) 04/27/2012  . Morbid obesity ([place]) 04/27/2012  . Hypoxia 04/27/2012  . Dyspnea on exertion 04/27/2012  . Smoker 04/27/2012   Discharge Diagnoses:    Patient Active Problem List   Diagnosis Date Noted  . History of subarachnoid hemorrhage   . SOB (shortness of breath)   . History of CVA with residual deficit   . Dysarthria, post-stroke   . Chronic obstructive pulmonary disease ([place])   . Tachypnea   . Bradycardia   . Hypokalemia   . Leukocytosis   . SIRS (systemic inflammatory response syndrome) (HCC)   . Acute blood loss anemia   . Thrombocytosis ([place])   . S/P mitral valve repair 04/30/2016  . 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 CHF due to valvular disease, acute on chronic, diastolic [id number]  . Chronic diastolic congestive heart failure due to valvular disease ([place]) 04/13/2016  . History of stroke 04/13/2016  . Cerebrovascular accident (CVA) due to occlusion of right carotid artery ([place]) 03/25/2016  . Splenic infarct 03/25/2016  . Type 2 diabetes mellitus with circulatory disorder ([place]) 03/25/2016  . Mitral regurgitation 03/06/2016  . HLD (hyperlipidemia) 03/06/2016  . Right carotid artery occlusion   . Epigastric abdominal tenderness on direct palpation   . Endocarditis of mitral valve   . Positive ANA (antinuclear antibody)   . Streptococcus viridans infection 02/04/2016  .  Cerebrovascular accident (CVA) due to embolism of cerebral artery ([place])   . Subarachnoid hemorrhage ([place]) 02/01/2016  . Stroke ([place])   . Visual changes   . Pulmonary infiltrates 01/23/2016  . Pulmonary hypertension assoc with unclear multi-factorial mechanisms  ([place]) 01/23/2016  . 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 Insulin dependent diabetes mellitus ([place]) 01/14/2016  . Essential hypertension 01/14/2016  . Hepatic cirrhosis ([place]) 01/14/2016  . COPD (chronic obstructive pulmonary disease) ([place]) 04/27/2012  . Chronic diastolic CHF (congestive heart failure) ([place]) 04/27/2012  . Morbid obesity ([place]) 04/27/2012  . Hypoxia 04/27/2012  . Dyspnea on exertion 04/27/2012  . Smoker 04/27/2012   Discharged Condition: good  History of present Illness:  Ms. Darpino is a 60 yo morbidly obese white female who was originally evaluated by Dr. 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  Also during that hospitalization she suffered a massive stroke.  Her hospital stay was complicated and she was later diischarged to [facility] facility for further rehabilitation where she continues to reside at present.  She presented for follow up with Dr. Roxy Manns on 04/13/2016 at which time he felt surgery would be the best treatment option.  However, her medical condition at that time was very poor, but Dr. Roxy Manns was willing to proceed with high risk procedure with aggressive treatment of her Heart failure symptoms.  Neurology also felt it would be best to wait 6 months from the date of her stroke to proceed.  The patient was evaluated at her Cardiologist office the next day who recommend admission to the hospital for treatment of her heart failure.     Hospital Course:   She was admitted to the hospital on 04/14/2016.  She was treated with aggressive IV diuretics for her heart failure.  Ultimately the advance heart failure team was consulted and aided in management of the patient.  The patient had extreme anxiety and Psychiatry consult was obtained to aid in medication management.  The patient had open wounds on her bilateral thighs from her  previous hospitalization.  Wound care consult was obtained.  [name] heart failure improved with treatment.  Dental surgery was consulted for dental clearance prior to proceeding with Mitral Valve Surgery.  Infectious disease was again consulted for further management of endocarditis.  She continued to progress and her medical status improved.  She underwent cardiac catheterization which did not show evidence of obstructive CAD, but there was preserved EF.  Dr. Roxy Manns has followed patient closely throughout her hospital stay.  She was felt medically stable to proceed with intervention on her Mitral Valve during this hospitalization.    She was taken to the operating room and underwent Mitral Valve Repair on 04/30/2016.  She tolerated the procedure well and was taken to the SICU in stable condition.  During her stay in the SICU the patient was weaned and extubated on POD #1.  She was weaned off Neo synephrine and Milrinone drips as tolerated.  She was diuresed with a Lasix drip for hypervolemia.  She had issues with agitation and sundowning during her stay in the SICU.  She was started on empiric Fortaz for possible pneumonia.  She developed rapid Atrial fibrillation and was subsequently treated with IV Amiodarone.  She converted to NSR.  The patient had issues with dysphagia.  SLP evaluation was done and diet was advanced as they recommended.  The patient has  continued to make progress.  She has been started on Coumadin post Mitral Valve Repair.  She will need an INR of 2.5-3.5.  Her current dose is 7.5 mg daily.  She continues to progress.  Her agitation and delirium have resolved.  She is currently tolerating a dysphagia 1 diet.  She is severely deconditioned which is not unexpected as previous stroke.  She will require in patient rehab at discharge and we do not feel she is a candidate for a nursing home.  She converted into Atrial Fibrillation and again required treatment with IV Amiodarone. Her tissue  cultures from the operating room have remained negative.  She will not require further antibiotics post surgery.   She has continued to make progress.  She is no longer experiencing Atrial Fibrillation.  Her pacing wires have been removed.  She is felt medically stable for discharge to CIR today               Consults: cardiology, pulmonary/intensive care, rehabilitation medicine and psychiatry  Significant Diagnostic Studies:  ECHO: Left ventricle: The cavity size was normal. There was moderate  concentric hypertrophy. Systolic function was normal. The  estimated ejection fraction was in the range of 60% to 65%. Wall  motion was normal; there were no regional wall motion  abnormalities. Features are consistent with a pseudonormal left  ventricular filling pattern, with concomitant abnormal relaxation  and increased filling pressure (grade 2 diastolic dysfunction).  Doppler parameters are consistent with elevated ventricular  end-diastolic filling pressure. - Mitral valve: A highly mobile echodensity is attached to the  posterior leaflet of the mitral valve measuring 19 x 9 mm. The  echodensity flops between LA and LV during systole/diastole.  Mitral valve leaflets are thickened predominantly the posterior.  There is severe mitral regurgitation with anteriorly directed  jet. The findings are consistent with mild stenosis. There was  severe regurgitation. - Left atrium: The atrium was moderately dilated. - Right ventricle: Systolic function was normal. - Right atrium: The atrium was normal in size. - Tricuspid valve: There was mild regurgitation. - Pulmonic valve: There was mild regurgitation. - Pulmonary arteries: Systolic pressure was mildly increased. PA  peak pressure: 40 mm Hg (S). - Pericardium, extracardiac: There was no pericardial effusion.  Impressions:  - LVEF is 60-65%. Grade 2 diastolic dysfunction.  Filling pressures are only mildly elevated.  A  vegetation on the mitral valve appears smaller when compared to  the prior study from 03/31/2016.  RVEF is normal.  RVSP = 66mmHg.   Treatments: surgery:    Mitral Valve Repair Complex valvuloplasty including autologous pericardial patch repair of perforated posterior leaflet Artificial Gore-tex neochord placement x6 Sorin Memo 3D ring annuloplasty (size 93mm, catalog #[id number], serial [id number])  Disposition: CIR  Discharge Medications;  The patient has been discharged on:   1.Beta Blocker:  Yes [ x  ]                              No   [   ]                              If No, reason:  2.Ace Inhibitor/ARB: Yes [   ]  No  [ x   ]                                     If No, reason: labile BP  3.Statin:   Yes [ x  ]                  No  [   ]                  If No, reason:  4.Ecasa:  Yes  [ x  ]                  No   [   ]                  If No, reason:      Medication List    STOP taking these medications        cefTRIAXone 2 g Solr injection  Commonly known as:  ROCEPHIN     sertraline 25 MG tablet  Commonly known as:  ZOLOFT      TAKE these medications        albuterol 108 (90 Base) MCG/ACT inhaler  Commonly known as:  PROVENTIL HFA;VENTOLIN HFA  Inhale 2 puffs into the lungs every 4 (four) hours as needed. For shortness of breath.     albuterol (2.5 MG/3ML) 0.083% nebulizer solution  Commonly known as:  PROVENTIL  Take 2.5 mg by nebulization at bedtime.     Alogliptin-Metformin HCl 12.03-999 MG Tabs  Take 1 tablet by mouth 2 (two) times daily.     ALPRAZolam 0.5 MG tablet  Commonly known as:  XANAX  Take 0.5 mg by mouth 3 (three) times daily as needed for anxiety. Reported on 04/09/2016     amiodarone 400 MG tablet  Commonly known as:  PACERONE  Take 1 tablet (400 mg total) by mouth 2 (two) times daily.     aspirin 81 MG tablet  Take 81 mg by mouth daily.     atenolol 25  MG tablet  Commonly known as:  TENORMIN  Take 12.5 mg by mouth 2 (two) times daily.     cholecalciferol 1000 units tablet  Commonly known as:  VITAMIN D  Take 1,000 Units by mouth daily.     clonazePAM 0.5 MG tablet  Commonly known as:  KLONOPIN  Take 0.5 mg by mouth at bedtime.     clonazePAM 0.5 MG tablet  Commonly known as:  KLONOPIN  Take 0.25 mg by mouth 3 (three) times daily as needed for anxiety.     escitalopram 10 MG tablet  Commonly known as:  LEXAPRO  Take 1 tablet (10 mg total) by mouth at bedtime.     furosemide 40 MG tablet  Commonly known as:  LASIX  Take 40 mg by mouth 2 (two) times daily.     Gerhardt's butt cream Crea  Apply 1 application topically as needed for irritation.     insulin NPH-regular Human (70-30) 100 UNIT/ML injection  Commonly known as:  NOVOLIN 70/30  Inject 6-21 Units into the skin 2 (two) times daily with a meal. Patient states she takes 70/30 6 units plus 1 additional unit for every 10 mg/dl over glucose of 150 mg/dl (with max being 300 mg/dl) BID     lovastatin 10 MG tablet  Commonly known as:  MEVACOR  Take 10 mg  by mouth at bedtime.     nitroGLYCERIN 0.4 MG SL tablet  Commonly known as:  NITROSTAT  Place 1 tablet (0.4 mg total) under the tongue every 5 (five) minutes x 3 doses as needed for chest pain.     nystatin powder  Commonly known as:  MYCOSTATIN/NYSTOP  Apply topically 2 (two) times daily.     OXYGEN  Inhale 2 L into the lungs continuous.     polyethylene glycol packet  Commonly known as:  MIRALAX / GLYCOLAX  Take 17 g by mouth daily.     potassium chloride 20 MEQ packet  Commonly known as:  KLOR-CON  Take 20 mEq by mouth 2 (two) times daily.     senna-docusate 8.6-50 MG tablet  Commonly known as:  Senokot-S  Take 1 tablet by mouth 2 (two) times daily.     spironolactone 25 MG tablet  Commonly known as:  ALDACTONE  Take 50 mg by mouth daily. Reported on 04/09/2016     traMADol 50 MG tablet  Commonly known  as:  ULTRAM  Take 50 mg by mouth every 6 (six) hours as needed for moderate pain.     traZODone 100 MG tablet  Commonly known as:  DESYREL  Take 100 mg by mouth at bedtime.     warfarin 7.5 MG tablet  Commonly known as:  COUMADIN  Take 1 tablet (7.5 mg total) by mouth daily at 6 PM.       Follow-up Information    Follow up with Rexene Alberts, MD On 06/08/2016.   Specialty:  Cardiothoracic Surgery   Why:  Appointment is at 11:30   Contact information:   [address] [place] [address] [phone]       Follow up with [facility] On 06/08/2016.   Why:  Please get CXR at 11:00   Contact information:   [facility]       Follow up with Richardson Dopp, PA-C On 05/28/2016.   Specialties:  Cardiology, Physician Assistant   Why:  3:15 PM   Contact information:   [id number] [place] [address] [phone]       Signed: Ellwood Handler 05/12/2016, 3:20 PM

## 2016-05-08 NOTE — Progress Notes (Addendum)
[address]       [place] [address]             [phone]        CARDIOTHORACIC SURGERY PROGRESS NOTE   R8 Days Post-Op Procedure(s) (LRB): MITRAL VALVE REPAIR  (N/A) TRANSESOPHAGEAL ECHOCARDIOGRAM (TEE) (N/A)  Subjective: Looks good and reports feeling well.  Sore in chest w/ cough.  Slept well overnight  Objective: Vital signs: BP Readings from Last 1 Encounters:  05/08/16 101/67   Pulse Readings from Last 1 Encounters:  05/08/16 53   Resp Readings from Last 1 Encounters:  05/08/16 20   Temp Readings from Last 1 Encounters:  05/08/16 97.7 F (36.5 C) Oral    Hemodynamics:    Physical Exam:  Rhythm:   Sinus 50's  Breath sounds: Diminished at bases  Heart sounds:  RRR w/out murmur  Incisions:  Clean and dry  Abdomen:  Soft, non-distended, non-tender  Extremities:  Warm, well-perfused    Intake/Output from previous day: 06/15 0701 - 06/16 0700 In: 993.5 [P.O.:120; I.V.:573.5; IV Piggyback:300] Out: -  Intake/Output this shift:    Lab Results:  CBC: Recent Labs  05/07/16 0450 05/07/16 2219 05/08/16 0516  WBC 14.9*  --  16.7*  HGB 9.3* 12.2 9.8*  HCT 32.6* 36.0 34.5*  PLT 569*  --  617*    BMET:  Recent Labs  05/07/16 0800 05/07/16 2219 05/08/16 0516  NA 136 137 138  K 3.2* 3.9 4.0  CL 91* 87* 94*  CO2 36*  --  39*  GLUCOSE 178* 156* 129*  BUN 12 13 10   CREATININE 0.67 0.90 0.79  CALCIUM 10.0  --  10.3     PT/INR:   Recent Labs  05/08/16 0516  LABPROT 18.3*  INR 1.52*    CBG (last 3)   Recent Labs  05/07/16 1124 05/07/16 1720 05/07/16 2150  GLUCAP 131* 129* 126*    ABG    Component Value Date/Time   PHART 7.343* 05/02/2016 0345   PCO2ART 60.3* 05/02/2016 0345   PO2ART 112* 05/02/2016 0345   HCO3 31.9* 05/02/2016 0345   TCO2 39 05/07/2016 2219   O2SAT 69.6 05/03/2016 0423    CXR: PORTABLE CHEST 1 VIEW  COMPARISON: 05/05/2016  FINDINGS: PICC line has been placed in the interval  on the left side. It lies at the cavoatrial junction. Cardiac shadow is mildly enlarged. Postsurgical changes are again seen. Continued improvement in the degree of vascular congestion is noted although increased density is noted over the right base consistent with an enlarging right pleural effusion. No focal confluent infiltrate is seen.  IMPRESSION: Increasing right-sided pleural effusion.   Electronically Signed  By: Inez Catalina M.D.  On: 05/08/2016 07:58   Assessment/Plan: S/P Procedure(s) (LRB): MITRAL VALVE REPAIR  (N/A) TRANSESOPHAGEAL ECHOCARDIOGRAM (TEE) (N/A)  Overall doing well POD8 Post op Afib, had recurrent Afib w/ increased HR yesterday, converted back last night and currently maintaining NSR on IV amiodarone BP soft but stable Breathing comfortably w/ O2 sats 98-100% on 2 L/min via [place] Acute on chronic diastolic CHF with expected post-op volume excess, I/O's + 1 liter mL yesterday, weight not recorded today Expected post op acute blood loss anemia, Hgb stable 9.8 Expected post op atelectasis, mild Post op leukocytosis w/out fever, WBC up slightly 16.7 k today - Day #7 of 7 IV Fortaz for possible HCAP Strep viridans endocarditis - resolved - pre-op follow up blood cultures off antibiotics negative and no signs  of active infection at time of mitral valve repair Pulmonary hypertension Obesity hypoventilation syndrome Type II diabetes mellitus, CBG's well controlled  Morbid obesity 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 Splenic infarct Severe anxiety and depression Panic attacks   Continue amiodarone IV for now  Continue low dose lovenox until INR > 1.8  Coumadin - increase to 5 mg today  Increase lasix - will convert to drip since BP soft  Continue spironolactone  Mobilize  Continue PT/OT  Potentially ready for d/c to inpatient rehab service early next week if rhythm  stabilizes  Rexene Alberts, MD 05/08/2016 7:55 AM

## 2016-05-08 NOTE — Progress Notes (Signed)
Speech Language Pathology Treatment: Dysphagia  Patient Details Name: Margaret [name] MRN: [id number] DOB: [birth year] Today's Date: 05/08/2016 Time: [id number] SLP Time Calculation (min) (ACUTE ONLY): 16 min  Assessment / Plan / Recommendation Clinical Impression  Pt asleep but awakens easily. She consumes thin liquids by cup and straw without overt s/s of aspiration, but she does have a tendency to take larger quantities at a time despite SLP cueing. She says that her diet had been advanced to regular consistencies while she was at SNF, and consumed bites of cracker today with good oral clearance, yet prolonged mastication and manipulation. Recommend advancement to Dys 2 diet, continuing thin liquids by cup only.   HPI HPI: Margaret [name] is a 60 y.o. female with a hx of COPD, HFpEF (seen by Dr. Tamala Julian during hospital admission 7/13), OSA, HTN, HL, poorly controlled diabetes, tobacco abuse.   Admitted in 2/17 with splenic infarct and was placed on Xarelto for anticoagulation.   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 She was noted to have strep viridans bacteremia and her presentation was felt to be related to septic emboli. 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 She underwent endovascular thrombectomy by IR. It was felt that she would need to undergo rehabilitation with significant recovery before proceeding to mitral valve surgery. She was discharged to [facility].  The patient was seen by ST during the March admission and was recomended to be on a dysphagia 1 diet with thin liquids.       SLP Plan  Continue with current plan of care     Recommendations  Diet recommendations: Dysphagia 2 (fine chop);Thin liquid Liquids provided via: Cup;No straw Medication  Administration: Whole meds with puree Supervision: Full supervision/cueing for compensatory strategies Compensations: Minimize environmental distractions;Slow rate;Small sips/bites Postural Changes and/or Swallow Maneuvers: Seated upright 90 degrees             Oral Care Recommendations: Oral care BID Follow up Recommendations: Inpatient Rehab Plan: Continue with current plan of care     GO               Germain Osgood, M.A. CCC-SLP [phone]  Germain Osgood 05/08/2016, 3:47 PM

## 2016-05-08 NOTE — Progress Notes (Signed)
I met with pt and spouse at bedside to further discuss eventual rehab plans. They clarified previous concerns for d/c home and now have caregiver support and realistic plans. I will discuss with the rehab team and follow up next week for possible admission to inpt rehab when medically ready. [phone]

## 2016-05-09 LAB — GLUCOSE, CAPILLARY
Glucose-Capillary: 120 mg/dL — ABNORMAL HIGH (ref 65–99)
Glucose-Capillary: 146 mg/dL — ABNORMAL HIGH (ref 65–99)
Glucose-Capillary: 161 mg/dL — ABNORMAL HIGH (ref 65–99)
Glucose-Capillary: 160 mg/dL — ABNORMAL HIGH (ref 65–99)

## 2016-05-09 LAB — BASIC METABOLIC PANEL
ANION GAP: 7 (ref 5–15)
BUN: 10 mg/dL (ref 6–20)
CHLORIDE: 91 mmol/L — AB (ref 101–111)
CO2: 38 mmol/L — ABNORMAL HIGH (ref 22–32)
Calcium: 10 mg/dL (ref 8.9–10.3)
Creatinine, Ser: 0.77 mg/dL (ref 0.44–1.00)
Glucose, Bld: 128 mg/dL — ABNORMAL HIGH (ref 65–99)
POTASSIUM: 3.5 mmol/L (ref 3.5–5.1)
SODIUM: 136 mmol/L (ref 135–145)

## 2016-05-09 LAB — PROTIME-INR
INR: 1.81 — AB (ref 0.00–1.49)
Prothrombin Time: 21 seconds — ABNORMAL HIGH (ref 11.6–15.2)

## 2016-05-09 LAB — CBC
HCT: 33.5 % — ABNORMAL LOW (ref 36.0–46.0)
HEMOGLOBIN: 9.5 g/dL — AB (ref 12.0–15.0)
MCH: 24.9 pg — ABNORMAL LOW (ref 26.0–34.0)
MCHC: 28.4 g/dL — ABNORMAL LOW (ref 30.0–36.0)
MCV: 87.9 fL (ref 78.0–100.0)
PLATELETS: 655 10*3/uL — AB (ref 150–400)
RBC: 3.81 MIL/uL — AB (ref 3.87–5.11)
RDW: 18.1 % — ABNORMAL HIGH (ref 11.5–15.5)
WBC: 13.6 10*3/uL — AB (ref 4.0–10.5)

## 2016-05-09 MED ORDER — POTASSIUM CHLORIDE 10 MEQ/50ML IV SOLN
10.0000 meq | INTRAVENOUS | Status: AC | PRN
  Administered 2016-05-09 (×3): 10 meq via INTRAVENOUS
  Filled 2016-05-09 (×3): qty 50

## 2016-05-09 MED ORDER — POTASSIUM CHLORIDE CRYS ER 20 MEQ PO TBCR
20.0000 meq | EXTENDED_RELEASE_TABLET | Freq: Two times a day (BID) | ORAL | Status: DC
  Administered 2016-05-09 – 2016-05-11 (×6): 20 meq via ORAL
  Filled 2016-05-09 (×7): qty 1

## 2016-05-09 NOTE — Progress Notes (Signed)
9 Days Post-Op Procedure(s) (LRB): MITRAL VALVE REPAIR  (N/A) TRANSESOPHAGEAL ECHOCARDIOGRAM (TEE) (N/A) Subjective:  No complaints. Sitting up in bed eating.  Objective: Vital signs in last 24 hours: Temp:  [97.5 F (36.4 C)-98.3 F (36.8 C)] 97.9 F (36.6 C) (06/17 1146) Pulse Rate:  [50-62] 56 (06/17 1305) Cardiac Rhythm:  [-] Sinus bradycardia (06/17 0800) Resp:  [15-27] 19 (06/17 1305) BP: (80-136)/(36-82) 89/56 mmHg (06/17 1305) SpO2:  [93 %-100 %] 96 % (06/17 1305) Weight:  [108.1 kg (238 lb 5.1 oz)-108.8 kg (239 lb 13.8 oz)] 108.1 kg (238 lb 5.1 oz) (06/17 0424)  Hemodynamic parameters for last 24 hours:    Intake/Output from previous day: 06/16 0701 - 06/17 0700 In: 1340.8 [P.O.:480; I.V.:810.8; IV Piggyback:50] Out: -  Intake/Output this shift: Total I/O In: 320.2 [I.V.:220.2; IV Piggyback:100] Out: -   General appearance: alert and cooperative Neurologic: intact Heart: regular rate and rhythm, S1, S2 normal, no murmur, click, rub or gallop Lungs: clear to auscultation bilaterally Extremities: edema moderate in legs Wound: incisions ok  Lab Results:  Recent Labs  05/08/16 0516 05/09/16 0420  WBC 16.7* 13.6*  HGB 9.8* 9.5*  HCT 34.5* 33.5*  PLT 617* 655*   BMET:  Recent Labs  05/08/16 0516 05/09/16 0420  NA 138 136  K 4.0 3.5  CL 94* 91*  CO2 39* 38*  GLUCOSE 129* 128*  BUN 10 10  CREATININE 0.79 0.77  CALCIUM 10.3 10.0    PT/INR:  Recent Labs  05/09/16 0420  LABPROT 21.0*  INR 1.81*   ABG    Component Value Date/Time   PHART 7.343* 05/02/2016 0345   HCO3 31.9* 05/02/2016 0345   TCO2 39 05/07/2016 2219   O2SAT 69.6 05/03/2016 0423   CBG (last 3)   Recent Labs  05/08/16 2149 05/09/16 0723 05/09/16 1144  GLUCAP 173* 120* 146*    Assessment/Plan: S/P Procedure(s) (LRB): MITRAL VALVE REPAIR  (N/A) TRANSESOPHAGEAL ECHOCARDIOGRAM (TEE) (N/A)  She is hemodynamically stable in sinus brady rhythm in the 50's on IV amio.  Will plan to switch to po tomorrow.  Respiratory status stable.  Volume excess: 4 lbs over preop wt. On lasix drip. Will repeat K+.  Continue to encourage nutrition, IS.  INR rising: continue coumadin 5 mg tonight.  Will need to go to rehab next week.   LOS: 25 days    Gaye Pollack 05/09/2016

## 2016-05-09 NOTE — Progress Notes (Signed)
Patient ID: Margaret [name], female   DOB: 1956/05/29, 60 y.o.   MRN: [id number]  SICU Evening Rounds:  Hemodynamically stable in sinus rhythm  Remains on lasix drip but unclear how much urine she is making due to incontinence. Will check weight in the am.

## 2016-05-10 LAB — PROTIME-INR
INR: 1.75 — ABNORMAL HIGH (ref 0.00–1.49)
PROTHROMBIN TIME: 20.4 s — AB (ref 11.6–15.2)

## 2016-05-10 LAB — GLUCOSE, CAPILLARY
Glucose-Capillary: 142 mg/dL — ABNORMAL HIGH (ref 65–99)
Glucose-Capillary: 135 mg/dL — ABNORMAL HIGH (ref 65–99)
Glucose-Capillary: 137 mg/dL — ABNORMAL HIGH (ref 65–99)
Glucose-Capillary: 172 mg/dL — ABNORMAL HIGH (ref 65–99)

## 2016-05-10 LAB — BASIC METABOLIC PANEL
Anion gap: 8 (ref 5–15)
BUN: 11 mg/dL (ref 6–20)
CHLORIDE: 90 mmol/L — AB (ref 101–111)
CO2: 36 mmol/L — ABNORMAL HIGH (ref 22–32)
CREATININE: 0.82 mg/dL (ref 0.44–1.00)
Calcium: 10 mg/dL (ref 8.9–10.3)
GFR calc Af Amer: >60 mL/min (ref >60)
GFR calc non Af Amer: >60 mL/min (ref >60)
GLUCOSE: 195 mg/dL — AB (ref 65–99)
POTASSIUM: 3.9 mmol/L (ref 3.5–5.1)
SODIUM: 134 mmol/L — AB (ref 135–145)

## 2016-05-10 LAB — CBC
HCT: 34.5 % — ABNORMAL LOW (ref 36.0–46.0)
HEMOGLOBIN: 10 g/dL — AB (ref 12.0–15.0)
MCH: 25.6 pg — AB (ref 26.0–34.0)
MCHC: 29 g/dL — AB (ref 30.0–36.0)
MCV: 88.2 fL (ref 78.0–100.0)
PLATELETS: 611 10*3/uL — AB (ref 150–400)
RBC: 3.91 MIL/uL (ref 3.87–5.11)
RDW: 18 % — ABNORMAL HIGH (ref 11.5–15.5)
WBC: 13 10*3/uL — ABNORMAL HIGH (ref 4.0–10.5)

## 2016-05-10 MED ORDER — FAMOTIDINE 20 MG PO TABS
20.0000 mg | ORAL_TABLET | Freq: Two times a day (BID) | ORAL | Status: DC
  Administered 2016-05-10 – 2016-05-12 (×4): 20 mg via ORAL
  Filled 2016-05-10 (×4): qty 1

## 2016-05-10 MED ORDER — FUROSEMIDE 40 MG PO TABS: 40.0000 mg | ORAL_TABLET | Freq: Every day | ORAL | Status: DC

## 2016-05-10 MED ORDER — WARFARIN SODIUM 7.5 MG PO TABS
7.5000 mg | ORAL_TABLET | Freq: Every day | ORAL | Status: DC
  Administered 2016-05-10 – 2016-05-12 (×3): 7.5 mg via ORAL
  Filled 2016-05-10 (×3): qty 1

## 2016-05-10 MED ORDER — AMIODARONE HCL 200 MG PO TABS
400.0000 mg | ORAL_TABLET | Freq: Two times a day (BID) | ORAL | Status: DC
  Administered 2016-05-10 – 2016-05-12 (×5): 400 mg via ORAL
  Filled 2016-05-10 (×5): qty 2

## 2016-05-10 NOTE — Clinical Social Work Note (Signed)
CSW met with pt at bedside. Pt stated that she only wants hospital SNF, and if not able to go to SNF at hospital pt wants to dc home with home health.

## 2016-05-10 NOTE — Progress Notes (Signed)
10 Days Post-Op Procedure(s) (LRB): MITRAL VALVE REPAIR  (N/A) TRANSESOPHAGEAL ECHOCARDIOGRAM (TEE) (N/A) Subjective:  No complaints  Objective: Vital signs in last 24 hours: Temp:  [97.7 F (36.5 C)-98.2 F (36.8 C)] 98 F (36.7 C) (06/18 1352) Pulse Rate:  [55-66] 66 (06/18 1500) Cardiac Rhythm:  [-] Normal sinus rhythm (06/18 1400) Resp:  [13-23] 23 (06/18 1500) BP: (86-110)/(49-91) 95/75 mmHg (06/18 1400) SpO2:  [94 %-100 %] 100 % (06/18 1500) Weight:  [102.4 kg (225 lb 12 oz)] 102.4 kg (225 lb 12 oz) (06/18 0600)  Hemodynamic parameters for last 24 hours:    Intake/Output from previous day: 06/17 0701 - 06/18 0700 In: 657.2 [I.V.:557.2; IV Piggyback:100] Out: 1201 [Urine:1000; Stool:201] Intake/Output this shift: Total I/O In: 473.6 [P.O.:180; I.V.:293.6] Out: 200 [Urine:200]  General appearance: alert and cooperative Neurologic: left sided weakness Heart: regular rate and rhythm, S1, S2 normal, no murmur, click, rub or gallop Lungs: clear to auscultation bilaterally Extremities: edema minimal in feet. Wound: incision ok  Lab Results:  Recent Labs  05/09/16 0420 05/10/16 0314  WBC 13.6* 13.0*  HGB 9.5* 10.0*  HCT 33.5* 34.5*  PLT 655* 611*   BMET:  Recent Labs  05/09/16 0420 05/10/16 0314  NA 136 134*  K 3.5 3.9  CL 91* 90*  CO2 38* 36*  GLUCOSE 128* 195*  BUN 10 11  CREATININE 0.77 0.82  CALCIUM 10.0 10.0    PT/INR:  Recent Labs  05/10/16 0314  LABPROT 20.4*  INR 1.75*   ABG    Component Value Date/Time   PHART 7.343* 05/02/2016 0345   HCO3 31.9* 05/02/2016 0345   TCO2 39 05/07/2016 2219   O2SAT 69.6 05/03/2016 0423   CBG (last 3)   Recent Labs  05/09/16 2151 05/10/16 0723 05/10/16 1349  GLUCAP 160* 142* 135*    Assessment/Plan: S/P Procedure(s) (LRB): MITRAL VALVE REPAIR  (N/A) TRANSESOPHAGEAL ECHOCARDIOGRAM (TEE) (N/A)  She remains hemodynamically stable in sinus rhythm on IV amiodarone. Will switch to po.  Volume  excess is much better. She lost 13 lbs yesterday down to 225 lbs which is lower than preop. Will stop lasix drip and switch to po.  INR down slightly on 5 mg daily to 1.75. Will increase to 7.5 mg.  She needs continued PT and is about ready for rehab.   LOS: 26 days    Gaye Pollack 05/10/2016

## 2016-05-11 ENCOUNTER — Ambulatory Visit: Payer: Self-pay | Admitting: Internal Medicine

## 2016-05-11 ENCOUNTER — Inpatient Hospital Stay (HOSPITAL_COMMUNITY): Payer: Medicaid Other

## 2016-05-11 LAB — CBC
HCT: 36.1 % (ref 36.0–46.0)
Hemoglobin: 10.4 g/dL — ABNORMAL LOW (ref 12.0–15.0)
MCH: 25.2 pg — AB (ref 26.0–34.0)
MCHC: 28.8 g/dL — AB (ref 30.0–36.0)
MCV: 87.6 fL (ref 78.0–100.0)
PLATELETS: 680 10*3/uL — AB (ref 150–400)
RBC: 4.12 MIL/uL (ref 3.87–5.11)
RDW: 18 % — AB (ref 11.5–15.5)
WBC: 13.3 10*3/uL — ABNORMAL HIGH (ref 4.0–10.5)

## 2016-05-11 LAB — PROTIME-INR
INR: 1.56 — ABNORMAL HIGH (ref 0.00–1.49)
Prothrombin Time: 18.7 seconds — ABNORMAL HIGH (ref 11.6–15.2)

## 2016-05-11 LAB — GLUCOSE, CAPILLARY
Glucose-Capillary: 135 mg/dL — ABNORMAL HIGH (ref 65–99)
Glucose-Capillary: 136 mg/dL — ABNORMAL HIGH (ref 65–99)
Glucose-Capillary: 136 mg/dL — ABNORMAL HIGH (ref 65–99)
Glucose-Capillary: 132 mg/dL — ABNORMAL HIGH (ref 65–99)

## 2016-05-11 LAB — BASIC METABOLIC PANEL
Anion gap: 4 — ABNORMAL LOW (ref 5–15)
BUN: 10 mg/dL (ref 6–20)
CALCIUM: 11 mg/dL — AB (ref 8.9–10.3)
CO2: 36 mmol/L — ABNORMAL HIGH (ref 22–32)
CREATININE: 0.85 mg/dL (ref 0.44–1.00)
Chloride: 94 mmol/L — ABNORMAL LOW (ref 101–111)
GFR calc Af Amer: >60 mL/min (ref >60)
GLUCOSE: 188 mg/dL — AB (ref 65–99)
Potassium: 4 mmol/L (ref 3.5–5.1)
Sodium: 134 mmol/L — ABNORMAL LOW (ref 135–145)

## 2016-05-11 MED ORDER — FUROSEMIDE 80 MG PO TABS
80.0000 mg | ORAL_TABLET | Freq: Every day | ORAL | Status: DC
  Administered 2016-05-11 – 2016-05-12 (×2): 80 mg via ORAL
  Filled 2016-05-11 (×2): qty 1

## 2016-05-11 MED ORDER — INSULIN DETEMIR 100 UNIT/ML ~~LOC~~ SOLN
28.0000 [IU] | Freq: Every day | SUBCUTANEOUS | Status: DC
  Administered 2016-05-11 – 2016-05-12 (×2): 28 [IU] via SUBCUTANEOUS
  Filled 2016-05-11 (×2): qty 0.28

## 2016-05-11 MED ORDER — PROMETHAZINE HCL 25 MG/ML IJ SOLN
12.5000 mg | Freq: Four times a day (QID) | INTRAMUSCULAR | Status: DC | PRN
Start: 2016-05-11 — End: 2016-05-12
  Administered 2016-05-11: 12.5 mg via INTRAVENOUS
  Filled 2016-05-11: qty 1

## 2016-05-11 NOTE — H&P (Signed)
Physical Medicine and Rehabilitation Admission H&P    Chief complaint: Weakness   HPI: Margaret [name] is a 60 y.o. 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 At the nursing facility patient remained essentially bed bound. Prior nursing home placement patient live with her husband. She has assistance from her husband's sister's son and family. One level home. Presented 04/23/2016 plan mitral valve surgery and underwent mitral valve repair 04/30/2016 per Dr. Roxy Manns. Sternal precautions as directed. Hospital course pain management. Patient was short run of PAF with RVR placed on amiodarone with follow-up for cardiology per services. Dysphagia maintain on dysphagia #3 thin liquid diet. TPN initiated for nutritional support and discontinued as nutrition improved. Placed on Coumadin therapy after mitral valve repair. Acute on chronic blood loss anemia hemoglobin 10.4 . patient with fluid overload maintained on Lasix infusion and close monitoring of renal function. Physical and occupational therapy ongoing family requesting to have patient discharged to home instead of back to skilled nursing facility. Recommendations for physical medicine rehabilitation consult. Patient was admitted for copper in the rehabilitation program  ROS Constitutional: Negative for fever and chills.  HENT: Negative for hearing loss.  Eyes: Negative for blurred vision and double vision.  Respiratory: Positive for cough and shortness of breath.  Cardiovascular: Positive for palpitations and leg swelling. Negative for chest pain.    Gastrointestinal: Positive for constipation. Negative for nausea and vomiting.  Genitourinary: Negative for dysuria and hematuria.  Musculoskeletal: Positive for myalgias, back pain and joint pain.  Skin: Negative for rash.  Neurological: Positive for speech change, focal weakness and weakness. Negative for seizures and headaches.  Psychiatric/Behavioral: Positive for depression.   Panic attacks  All other systems reviewed and are negative   Past Medical History  Diagnosis Date  . Hypertension   . Hyperlipidemia   . COPD (chronic obstructive pulmonary disease) ([place]) 04/20/12  . Asthma   . Type II diabetes mellitus ([place])   . Anemia   . H/O hiatal hernia   . Osteoarthritis   . Pneumonia 04/2012  . Migraines   . Panic attacks 04/20/12  . Morbid obesity ([place])   . Heart murmur   . Bacterial endocarditis     Strep viridans   . Chronic diastolic CHF (congestive heart failure) ([place]) 04/27/2012  . Pulmonary hypertension assoc with unclear multi-factorial mechanisms ([place]) 01/23/2016  . Positive ANA (antinuclear antibody)   . 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 Stroke ([place])   . Subarachnoid bleed ([place]) 02/01/2016  . S/P mitral valve repair 04/30/2016    Complex valvuloplasty including autologous pericardial patch repair of perforated posterior leaflet, artificial Gore-tex neochord placement x6 and 26 mm Sorin Memo 3D ring annuloplasty   Past Surgical History  Procedure Laterality Date  . Laceration repair  1966    "right upper arm"  . Dilation and curettage of uterus  1990's  . Finger reattachment  1973    "right ring finger"  . Tee without cardioversion N/A 02/05/2016    Procedure: TRANSESOPHAGEAL ECHOCARDIOGRAM (TEE);  Surgeon: Larey Dresser, MD;  Location: [facility];  Service: Cardiovascular;  Laterality: N/A;  . Radiology with anesthesia N/A 02/06/2016    Procedure: SWHQPRFFM WITH ANESTHESIA;  Surgeon: Luanne Bras,  MD;  Location: [facility];  Service: Radiology;  Laterality: N/A;   . Cardiac catheterization N/A 04/23/2016    Procedure: Right/Left Heart Cath and Coronary Angiography;  Surgeon: Sherren Mocha, MD;  Location: [facility];  Service: Cardiovascular;  Laterality: N/A;  . Mitral valve repair N/A 04/30/2016    Procedure: MITRAL VALVE REPAIR ;  Surgeon: Rexene Alberts, MD;  Location: [facility];  Service: Open Heart Surgery;  Laterality: N/A;  . Tee without cardioversion N/A 04/30/2016    Procedure: TRANSESOPHAGEAL ECHOCARDIOGRAM (TEE);  Surgeon: Rexene Alberts, MD;  Location: [facility];  Service: Open Heart Surgery;  Laterality: N/A;   Family History  Problem Relation Age of Onset  . Allergies    . Hypertension    . Coronary artery disease Mother 75  . Stroke Father   . COPD Father   . Clotting disorder Maternal Grandmother     died from blood clot   Social History:  reports that she quit smoking about 4 years ago. Her smoking use included Cigarettes. She has a 8 pack-year smoking history. She has never used smokeless tobacco. She reports that she does not drink alcohol or use illicit drugs. Allergies:  Allergies  Allergen Reactions  . Codeine Hives, Itching and Other (See Comments)    "breathing problems"   Medications Prior to Admission  Medication Sig Dispense Refill  . Alogliptin-Metformin HCl 12.03-999 MG TABS Take 1 tablet by mouth 2 (two) times daily.    Marland Kitchen aspirin 81 MG tablet Take 81 mg by mouth daily.    Marland Kitchen atenolol (TENORMIN) 25 MG tablet Take 12.5 mg by mouth 2 (two) times daily.     . [EXPIRED] cefTRIAXone (ROCEPHIN) 2 g SOLR injection Inject 2 g into the vein daily. 10 each 0  . cholecalciferol (VITAMIN D) 1000 units tablet Take 1,000 Units by mouth daily.     . clonazePAM (KLONOPIN) 0.5 MG tablet Take 0.5 mg by mouth at bedtime.    . clonazePAM (KLONOPIN) 0.5 MG tablet Take 0.25 mg by mouth 3 (three) times daily as needed for anxiety.    . furosemide (LASIX) 40 MG tablet Take 40 mg by mouth 2 (two) times daily.    . insulin NPH-regular Human  (NOVOLIN 70/30) (70-30) 100 UNIT/ML injection Inject 6-21 Units into the skin 2 (two) times daily with a meal. Patient states she takes 70/30 6 units plus 1 additional unit for every 10 mg/dl over glucose of 150 mg/dl (with max being 300 mg/dl) BID    . lovastatin (MEVACOR) 10 MG tablet Take 10 mg by mouth at bedtime.    . OXYGEN Inhale 2 L into the lungs continuous.    . polyethylene glycol (MIRALAX / GLYCOLAX) packet Take 17 g by mouth daily.    Marland Kitchen senna-docusate (SENOKOT-S) 8.6-50 MG tablet Take 1 tablet by mouth 2 (two) times daily.    . sertraline (ZOLOFT) 25 MG tablet Take 25 mg by mouth daily.    Marland Kitchen spironolactone (ALDACTONE) 25 MG tablet Take 50 mg by mouth daily. Reported on 04/09/2016    . traMADol (ULTRAM) 50 MG tablet Take 50 mg by mouth every 6 (six) hours as needed for moderate pain.    . traZODone (DESYREL) 100 MG tablet Take 100 mg by mouth at bedtime.    Marland Kitchen albuterol (PROVENTIL HFA;VENTOLIN HFA) 108 (90 BASE) MCG/ACT inhaler Inhale 2 puffs into the lungs every 4 (four) hours as needed. For shortness of breath.    Marland Kitchen albuterol (PROVENTIL) (2.5  MG/3ML) 0.083% nebulizer solution Take 2.5 mg by nebulization at bedtime.     . ALPRAZolam (XANAX) 0.5 MG tablet Take 0.5 mg by mouth 3 (three) times daily as needed for anxiety. Reported on 04/09/2016    . nitroGLYCERIN (NITROSTAT) 0.4 MG SL tablet Place 1 tablet (0.4 mg total) under the tongue every 5 (five) minutes x 3 doses as needed for chest pain. 25 tablet 3    Home: Home Living Family/patient expects to be discharged to:: Skilled nursing facility Additional Comments: spouse states hoped to be able to take pt home, but did not get ramp completed.  States SW was supposed to be working on it.    Functional History: Prior Function Level of Independence: Needs assistance Gait / Transfers Assistance Needed: at SNF ambulating a little in parallel bars, assist for transfers and gets around in w/c ADL's / Homemaking Assistance Needed: Pt  requires assist for ADLs   Functional Status:  Mobility: Bed Mobility Overal bed mobility: Needs Assistance, +2 for physical assistance Bed Mobility: Sit to Supine, Rolling Rolling:  (rolls left with min A, but total to roll right) Sidelying to sit: Max assist, +2 for physical assistance, +2 for safety/equipment Supine to sit: Max assist, +2 for physical assistance, HOB elevated Sit to supine: +2 for physical assistance, Total assist General bed mobility comments: Due to sternal precuaitons, pt requires increased assistance for bed mobility Transfers Overall transfer level: Needs assistance Equipment used: 2 person hand held assist Transfer via Lift Equipment: [employer] Transfers: Sit to/from Stand, Risk manager, Lateral/Scoot Transfers Sit to Stand: Mod assist, +2 physical assistance Stand pivot transfers: Max assist, +2 physical assistance Squat pivot transfers: Mod assist, From elevated surface  Lateral/Scoot Transfers: Total assist, +2 physical assistance, With slide board General transfer comment: with use of belt and pad under her for stand pivot.  Also tried sliding board with pt total assist with transfer to pts' left.  Ambulation/Gait General Gait Details: unable; performed standing weight shifts Lt, Rt    ADL: ADL Overall ADL's : Needs assistance/impaired Eating/Feeding: Set up, Supervision/ safety, Cueing for safety Eating/Feeding Details (indicate cue type and reason): just resumed water and puree diet. Pt issued plate guard  - educated pt/husband and NT on use Grooming: Wash/dry face, Oral care, Set up, Supervision/safety, Sitting Upper Body Bathing: Minimal assitance, Sitting Lower Body Bathing: Maximal assistance, +2 for physical assistance, Bed level (for rolling) Upper Body Dressing : Maximal assistance Upper Body Dressing Details (indicate cue type and reason): sitting EOB Lower Body Dressing: Moderate assistance, Sit to/from stand Lower Body Dressing  Details (indicate cue type and reason): Pt is able to don socks with supervision EOB.   Mod A to move sit to stand  Toilet Transfer: +2 for physical assistance, Maximal assistance, Stand-pivot Toilet Transfer Details (indicate cue type and reason): recliner>to W/C Toileting- Clothing Manipulation and Hygiene: Maximal assistance, Sit to/from stand Functional mobility during ADLs:  (use of sky lift) General ADL Comments: Pt now with sternal precautions  Cognition: Cognition Overall Cognitive Status: Impaired/Different from baseline Orientation Level: Oriented X4 Cognition Arousal/Alertness: Awake/alert Behavior During Therapy: Impulsive Overall Cognitive Status: Impaired/Different from baseline Area of Impairment: Attention, Following commands, Problem solving Current Attention Level: Sustained (however gets easily distracted) Following Commands: Follows one step commands consistently (with occassional A to re-direct due to distraction) Problem Solving: Slow processing, Difficulty sequencing, Requires verbal cues, Requires tactile cues  Physical Exam: Blood pressure 119/74, pulse 63, temperature 98.4 F (36.9 C), temperature source Oral, resp. rate  21, height 5' 5" (1.651 m), weight 104 kg (229 lb 4.5 oz), SpO2 97 %. Physical Exam Constitutional: She appears well-developed.  Obese  HENT:  Head: Normocephalic and atraumatic.  Eyes: EOM are normal. Right eye exhibits no discharge. Left eye exhibits no discharge.  Neck: Normal range of motion. Neck supple. No thyromegaly present.  Cardiovascular: Regular rhythm.  Bradycardia  Respiratory: No respiratory distress.  Decreased breath sounds at the bases with limited inspiratory effort. Oxygen via [place] at 2L GI: Soft. Bowel sounds are normal.  Musculoskeletal: She exhibits edema. She exhibits no tenderness.  Neurological: She is alert. A cranial nerve deficit is present.  Flat affect.  Severe dysarthria but intelligible.  He is able to  provide her name and age in place.  Follows simple commands. Has reasonable insight and awareness. Memory fair. Delayed problem solving DTRs symmetric RUE/RLE: 4+/5 proximal to distal LUE 0/5. LLE: trace HE. 0/5 KE and ADF/PF Skin: Skin is warm and dry.  Chest incision clean dry and dressed  Psychiatric: Her affect is blunt. Her speech is delayed. She is slowed   Results for orders placed or performed during the hospital encounter of 04/14/16 (from the past 48 hour(s))  Glucose, capillary     Status: Abnormal   Collection Time: 05/09/16 11:44 AM  Result Value Ref Range   Glucose-Capillary 146 (H) 65 - 99 mg/dL   Comment 1 Capillary Specimen    Comment 2 Notify RN   Glucose, capillary     Status: Abnormal   Collection Time: 05/09/16  4:45 PM  Result Value Ref Range   Glucose-Capillary 161 (H) 65 - 99 mg/dL   Comment 1 Capillary Specimen    Comment 2 Notify RN   Glucose, capillary     Status: Abnormal   Collection Time: 05/09/16  9:51 PM  Result Value Ref Range   Glucose-Capillary 160 (H) 65 - 99 mg/dL   Comment 1 Capillary Specimen    Comment 2 Notify RN   Protime-INR     Status: Abnormal   Collection Time: 05/10/16  3:14 AM  Result Value Ref Range   Prothrombin Time 20.4 (H) 11.6 - 15.2 seconds   INR 1.75 (H) 0.00 - 1.49  CBC     Status: Abnormal   Collection Time: 05/10/16  3:14 AM  Result Value Ref Range   WBC 13.0 (H) 4.0 - 10.5 K/uL   RBC 3.91 3.87 - 5.11 MIL/uL   Hemoglobin 10.0 (L) 12.0 - 15.0 g/dL   HCT 34.5 (L) 36.0 - 46.0 %   MCV 88.2 78.0 - 100.0 fL   MCH 25.6 (L) 26.0 - 34.0 pg   MCHC 29.0 (L) 30.0 - 36.0 g/dL   RDW 18.0 (H) 11.5 - 15.5 %   Platelets 611 (H) 150 - 400 K/uL  Basic metabolic panel     Status: Abnormal   Collection Time: 05/10/16  3:14 AM  Result Value Ref Range   Sodium 134 (L) 135 - 145 mmol/L   Potassium 3.9 3.5 - 5.1 mmol/L   Chloride 90 (L) 101 - 111 mmol/L   CO2 36 (H) 22 - 32 mmol/L   Glucose, Bld 195 (H) 65 - 99 mg/dL   BUN 11 6 -  20 mg/dL   Creatinine, Ser 0.82 0.44 - 1.00 mg/dL   Calcium 10.0 8.9 - 10.3 mg/dL   GFR calc non Af Amer >60 >60 mL/min   GFR calc Af Amer >60 >60 mL/min    Comment: (NOTE) The eGFR has  been calculated using the CKD EPI equation. This calculation has not been validated in all clinical situations. eGFR's persistently <60 mL/min signify possible Chronic Kidney Disease.    Anion gap 8 5 - 15  Glucose, capillary     Status: Abnormal   Collection Time: 05/10/16  7:23 AM  Result Value Ref Range   Glucose-Capillary 142 (H) 65 - 99 mg/dL   Comment 1 Capillary Specimen    Comment 2 Notify RN   Glucose, capillary     Status: Abnormal   Collection Time: 05/10/16  1:49 PM  Result Value Ref Range   Glucose-Capillary 135 (H) 65 - 99 mg/dL   Comment 1 Capillary Specimen    Comment 2 Notify RN   Glucose, capillary     Status: Abnormal   Collection Time: 05/10/16  6:06 PM  Result Value Ref Range   Glucose-Capillary 137 (H) 65 - 99 mg/dL   Comment 1 Capillary Specimen   Glucose, capillary     Status: Abnormal   Collection Time: 05/10/16  9:43 PM  Result Value Ref Range   Glucose-Capillary 172 (H) 65 - 99 mg/dL   Comment 1 Capillary Specimen   Protime-INR     Status: Abnormal   Collection Time: 05/11/16  4:57 AM  Result Value Ref Range   Prothrombin Time 18.7 (H) 11.6 - 15.2 seconds   INR 1.56 (H) 0.00 - 1.49  CBC     Status: Abnormal   Collection Time: 05/11/16  4:57 AM  Result Value Ref Range   WBC 13.3 (H) 4.0 - 10.5 K/uL   RBC 4.12 3.87 - 5.11 MIL/uL   Hemoglobin 10.4 (L) 12.0 - 15.0 g/dL   HCT 36.1 36.0 - 46.0 %   MCV 87.6 78.0 - 100.0 fL   MCH 25.2 (L) 26.0 - 34.0 pg   MCHC 28.8 (L) 30.0 - 36.0 g/dL   RDW 18.0 (H) 11.5 - 15.5 %   Platelets 680 (H) 150 - 400 K/uL  Basic metabolic panel     Status: Abnormal   Collection Time: 05/11/16  4:57 AM  Result Value Ref Range   Sodium 134 (L) 135 - 145 mmol/L   Potassium 4.0 3.5 - 5.1 mmol/L   Chloride 94 (L) 101 - 111 mmol/L   CO2  36 (H) 22 - 32 mmol/L   Glucose, Bld 188 (H) 65 - 99 mg/dL   BUN 10 6 - 20 mg/dL   Creatinine, Ser 0.85 0.44 - 1.00 mg/dL   Calcium 11.0 (H) 8.9 - 10.3 mg/dL   GFR calc non Af Amer >60 >60 mL/min   GFR calc Af Amer >60 >60 mL/min    Comment: (NOTE) The eGFR has been calculated using the CKD EPI equation. This calculation has not been validated in all clinical situations. eGFR's persistently <60 mL/min signify possible Chronic Kidney Disease.    Anion gap 4 (L) 5 - 15  Glucose, capillary     Status: Abnormal   Collection Time: 05/11/16  8:55 AM  Result Value Ref Range   Glucose-Capillary 135 (H) 65 - 99 mg/dL   Comment 1 Notify RN    Dg Chest Port 1 View  05/11/2016  CLINICAL DATA: Atelectasis.  CHF.  Shortness of breath. EXAM: PORTABLE CHEST 1 VIEW COMPARISON:  05/08/2016. FINDINGS: Left PICC line stable position. Prior CABG and cardiac valve replacement. Cardiomegaly with diffuse bilateral pulmonary infiltrates suggesting congestive heart failure. Bilateral pneumonia cannot be excluded. Interim improvement of bilateral pleural effusions. No pneumothorax . IMPRESSION: 1.  Stable position of left PICC line. 2. Prior CABG and cardiac valve replacement. Persistent cardiomegaly and bilateral pulmonary infiltrates and or edema noted. Small bilateral pleural effusions, improved prior exam . Electronically Signed   By: Marcello Moores  Register   On: 05/11/2016 07:20       Medical Problem List and Plan: 1.  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  -admit to inpatient rehab  - Patient with sternal precautions after mitral valve repair 2.  DVT Prophylaxis/Anticoagulation: Coumadin therapy after mitral valve repair 3. Pain Management: Oxycodone as needed 4. Mood/panic attacks: Lexapro 10 mg daily at bedtime, trazodone 100 mg daily at bedtime, Klonopin 0.5 mg daily at bedtime and 3  times a day as needed 5. Neuropsych: This patient is generally capable of making decisions on her own behalf. 6. Skin/Wound Care: Routine skin checks 7. Fluids/Electrolytes/Nutrition: Routine I&O with follow-up chemistries 8. Dysphagia. Dysphagia #3 thin liquids. Follow speech therapy 9. PAF with RVR. Continue amiodarone, Aldactone 50 mg daily, Tenormin 12.5 mg twice a day. Follow-up cardiology services 10. Acute on chronic diastolic congestive heart failure. Lasix 80 mg daily. Monitor for any signs of fluid overload  -needs daily weights 11. Diabetes mellitus with peripheral neuropathy. Check blood sugars before meals and at bedtime. Continue Levemir 28 units daily. Diabetic teaching  Post Admission Physician Evaluation: 1. Functional deficits secondary  to debility/recent CVA. 2. Patient is admitted to receive collaborative, interdisciplinary care between the physiatrist, rehab nursing staff, and therapy team. 3. Patient's level of medical complexity and substantial therapy needs in context of that medical necessity cannot be provided at a lesser intensity of care such as a SNF. 4. Patient has experienced substantial functional loss from his/her baseline which was documented above under the "Functional History" and "Functional Status" headings.  Judging by the patient's diagnosis, physical exam, and functional history, the patient has potential for functional progress which will result in measurable gains while on inpatient rehab.  These gains will be of substantial and practical use upon discharge  in facilitating mobility and self-care at the household level. 5. Physiatrist will provide 24 hour management of medical needs as well as oversight of the therapy plan/treatment and provide guidance as appropriate regarding the interaction of the two. 6. 24 hour rehab nursing will assist with bladder management, bowel management, safety, skin/wound care, disease management, medication administration,  pain management and patient education  and help integrate therapy concepts, techniques,education, etc. 7. PT will assess and treat for/with: Lower extremity strength, range of motion, stamina, balance, functional mobility, safety, adaptive techniques and equipment, NMR, visual-spatial awareness, w/c assessment, family ed.   Goals are: min to mod assist. 8. OT will assess and treat for/with: ADL's, functional mobility, safety, upper extremity strength, adaptive techniques and equipment, NMR, visual-spatial awareness, ego support, family ed.   Goals are: min to mod assist. Therapy may not yet proceed with showering this patient. 9. SLP will assess and treat for/with: cognition, communication, swallowing, education.  Goals are: supervision to min assist. 10. Case Management and Social Worker will assess and treat for psychological issues and discharge planning. 11. Team conference will be held weekly to assess progress toward goals and to determine barriers to discharge. 12. Patient will receive at least 3 hours of therapy per day at least 5 days per week. 13. ELOS: 22-27 days       14. Prognosis:  good      T.  Naaman Plummer, MD, [facility] 05/12/2016

## 2016-05-11 NOTE — Progress Notes (Addendum)
Pt just transferred to [facility] in to see pt and pt with nausea and vomiting/250 ml documented. I will follow up in the morning . SP:[id number]

## 2016-05-11 NOTE — Progress Notes (Signed)
Speech Language Pathology Treatment: Dysphagia  Patient Details Name: Margaret [name] MRN: [id number] DOB: 1956-04-09 Today's Date: 05/11/2016 Time: 1157-1209 SLP Time Calculation (min) (ACUTE ONLY): 12 min  Assessment / Plan / Recommendation Clinical Impression  Pt reported some nausea earlier today, but was agreeable to try saltines and water to determine readiness to advance solids and possible reintroduction of straws. Across all consistencies tested, no overt signs of aspiration were noted. She does still have mildly slow mastication, but oral clearance is good. Recommend advancement to Dys 3 diet, continuing thin liquids and meds whole in puree. Would allow straws.   HPI HPI: Margaret [name] is a 60 y.o. female with a hx of COPD, HFpEF (seen by Dr. Tamala Julian during hospital admission 7/13), OSA, HTN, HL, poorly controlled diabetes, tobacco abuse.   Admitted in 2/17 with splenic infarct and was placed on Xarelto for anticoagulation.   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 She was noted to have strep viridans bacteremia and her presentation was felt to be related to septic emboli. 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 She underwent endovascular thrombectomy by IR. It was felt that she would need to undergo rehabilitation with significant recovery before proceeding to mitral valve surgery. She was discharged to [facility].  The patient was seen by ST during the March admission and was recomended to be on a dysphagia 1 diet with thin liquids.       SLP Plan  Continue with current plan of care     Recommendations  Diet recommendations: Dysphagia 3 (mechanical soft);Thin liquid Liquids provided via: Cup;Straw Medication Administration: Whole meds with  puree Supervision: Full supervision/cueing for compensatory strategies Compensations: Minimize environmental distractions;Slow rate;Small sips/bites Postural Changes and/or Swallow Maneuvers: Seated upright 90 degrees             Oral Care Recommendations: Oral care BID Follow up Recommendations: Inpatient Rehab Plan: Continue with current plan of care     GO               Germain Osgood, M.A. CCC-SLP [phone]  Germain Osgood 05/11/2016, 12:20 PM

## 2016-05-11 NOTE — Progress Notes (Signed)
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  CURRENT MEDS  . amiodarone  400 mg Oral BID  . antiseptic oral rinse  7 mL Mouth Rinse q12n4p  . aspirin EC  81 mg Oral Daily  . atenolol  12.5 mg Oral BID  . bisacodyl  10 mg Oral Daily   Or  . bisacodyl  10 mg Rectal Daily  . chlorhexidine  15 mL Mouth Rinse BID  . clonazePAM  0.5 mg Oral QHS  . docusate sodium  200 mg Oral Daily  . enoxaparin (LOVENOX) injection  40 mg Subcutaneous Q24H  . escitalopram  10 mg Oral QHS  . famotidine  20 mg Oral BID  . furosemide  80 mg Oral Daily  . insulin aspart  0-20 Units Subcutaneous TID WC  . insulin detemir  28 Units Subcutaneous Daily  . nystatin   Topical BID  . potassium chloride  20 mEq Oral BID  . sodium chloride flush  10-40 mL Intracatheter Q12H  . sodium chloride flush  3 mL Intravenous Q12H  . spironolactone  50 mg Oral Daily  . traZODone  100 mg Oral QHS  . warfarin  7.5 mg Oral q1800  . Warfarin - Physician Dosing Inpatient   Does not apply  q1800   . sodium chloride Stopped (05/10/16 1800)    OBJECTIVE  Filed Vitals:   05/11/16 0800 05/11/16 0900 05/11/16 0935 05/11/16 1000  BP: 119/74  138/51 120/62  Pulse: 63 64 66 65  Temp: 98.4 F (36.9 C)     TempSrc: Oral     Resp: 21 21 24 22   Height:      Weight:      SpO2: 97% 98% 100% 99%    Intake/Output Summary (Last 24 hours) at 05/11/16 1100 Last data filed at 05/11/16 0900  Gross per 24 hour  Intake  336.9 ml  Output      1 ml  Net  335.9 ml   Filed Weights   05/09/16 0424 05/10/16 0600 05/11/16 0600  Weight: 238 lb 5.1 oz (108.1 kg) 225 lb 12 oz (102.4 kg) 229 lb 4.5 oz (104 kg)    PHYSICAL EXAM  General: AAOx3 Neuro: Grossly intact HEENT:  Normal  Neck: Supple Lungs:  Mildly diminished BS bases Heart: RRR Abdomen: Soft, non-tender, non-distended Extremities: Trace edema.   Accessory Clinical Findings  CBC  Recent Labs  05/10/16 0314 05/11/16 0457  WBC 13.0* 13.3*  HGB 10.0* 10.4*  HCT 34.5* 36.1  MCV 88.2 87.6  PLT 611* [id number]*   Basic Metabolic Panel  Recent Labs  05/10/16 0314 05/11/16 0457  NA 134* 134*  K 3.9 4.0  CL 90* 94*  CO2 36* 36*  GLUCOSE 195* 188*  BUN 11 10  CREATININE 0.82 0.85  CALCIUM 10.0 11.0*   Radiology/Studies  Dg Chest Port 1 View  05/04/2016  CLINICAL DATA:  Shortness of breath.  Chest tube placement. EXAM: PORTABLE CHEST 1 VIEW COMPARISON:  05/03/2016. FINDINGS: Right IJ line in stable position. Interim removal of right chest tube. Left chest tube in stable position. No pneumothorax. Prior CABG with bilateral diffuse pulmonary infiltrates consistent with persistent pulmonary edema, slightly increased from prior exam. Small pleural effusions cannot be excluded. IMPRESSION: 1. Interim removal of right chest tube. Left chest tube in stable position. No pneumothorax. Right IJ line stable position. 2. Prior CABG. Cardiomegaly with diffuse bilateral pulmonary infiltrates consistent with pulmonary edema again  noted. Pulmonary edema has progressed slightly from prior exam. Small pleural effusions cannot be excluded . Electronically Signed   By: Rosana Fret: Sinus    ASSESSMENT AND PLAN  60 year old post MV Repair on 04/30/16  1. PAF - remains in sinus; continue amiodarone.  2. Acute on chronic diastolic CHF - continue present dose of diuretics.  3. Hypertension -BP controlled  4. S/P MV repair  Signed, Kirk Ruths MD, [facility] 05/11/2016

## 2016-05-11 NOTE — Progress Notes (Signed)
Patient refusing BIPAP QHS, RT made pt aware that if she changed her mind to call. Pt stated she is ok with nasal cannula.

## 2016-05-11 NOTE — Progress Notes (Addendum)
[address]       [place] [address]             [phone]        CARDIOTHORACIC SURGERY PROGRESS NOTE   R11 Days Post-Op Procedure(s) (LRB): MITRAL VALVE REPAIR  (N/A) TRANSESOPHAGEAL ECHOCARDIOGRAM (TEE) (N/A)  Subjective: Looks good and feels well.  Reports good appetite and feeling somewhat stronger.  Moved the toes on her left foot for the first time yesterday  Objective: Vital signs: BP Readings from Last 1 Encounters:  05/11/16 119/74   Pulse Readings from Last 1 Encounters:  05/11/16 63   Resp Readings from Last 1 Encounters:  05/11/16 21   Temp Readings from Last 1 Encounters:  05/11/16 97.8 F (36.6 C) Axillary    Hemodynamics:    Physical Exam:  Rhythm:   Sinus w/ occasional episodes PAF  Breath sounds: clear  Heart sounds:  RRR w/out murmur  Incisions:  Clean and dry  Abdomen:  Soft, non-distended, non-tender  Extremities:  Warm, well-perfused    Intake/Output from previous day: 06/18 0701 - 06/19 0700 In: 603.7 [P.O.:240; I.V.:363.7] Out: 200 [Urine:200] Intake/Output this shift:    Lab Results:  CBC: Recent Labs  05/10/16 0314 05/11/16 0457  WBC 13.0* 13.3*  HGB 10.0* 10.4*  HCT 34.5* 36.1  PLT 611* 680*    BMET:  Recent Labs  05/10/16 0314 05/11/16 0457  NA 134* 134*  K 3.9 4.0  CL 90* 94*  CO2 36* 36*  GLUCOSE 195* 188*  BUN 11 10  CREATININE 0.82 0.85  CALCIUM 10.0 11.0*     PT/INR:   Recent Labs  05/11/16 0457  LABPROT 18.7*  INR 1.56*    CBG (last 3)   Recent Labs  05/10/16 1349 05/10/16 1806 05/10/16 2143  GLUCAP 135* 137* 172*    ABG    Component Value Date/Time   PHART 7.343* 05/02/2016 0345   PCO2ART 60.3* 05/02/2016 0345   PO2ART 112* 05/02/2016 0345   HCO3 31.9* 05/02/2016 0345   TCO2 39 05/07/2016 2219   O2SAT 69.6 05/03/2016 0423    CXR: PORTABLE CHEST 1 VIEW  COMPARISON: 05/08/2016.  FINDINGS: Left PICC line stable position. Prior CABG and cardiac  valve replacement. Cardiomegaly with diffuse bilateral pulmonary infiltrates suggesting congestive heart failure. Bilateral pneumonia cannot be excluded. Interim improvement of bilateral pleural effusions. No pneumothorax .  IMPRESSION: 1. Stable position of left PICC line. 2. Prior CABG and cardiac valve replacement. Persistent cardiomegaly and bilateral pulmonary infiltrates and or edema noted. Small bilateral pleural effusions, improved prior exam .   Electronically Signed  By: Marcello Moores Register  On: 05/11/2016 07:20   Assessment/Plan: S/P Procedure(s) (LRB): MITRAL VALVE REPAIR  (N/A) TRANSESOPHAGEAL ECHOCARDIOGRAM (TEE) (N/A)  Overall doing well POD11 Post op Afib, staying in NSR most of the time BP stable Breathing comfortably w/ O2 sats 98-100% on 2 L/min via [place] Acute on chronic diastolic CHF with expected post-op volume excess, I/O's + 400 mL yesterday but weight below baseline Expected post op acute blood loss anemia, Hgb improved 10.4 Expected post op atelectasis, mild Post op leukocytosis w/out fever, WBC down to 13.3 k today Strep viridans endocarditis - resolved - pre-op follow up blood cultures off antibiotics negative and no signs of active infection at time of mitral valve repair Pulmonary hypertension Obesity hypoventilation syndrome Type II diabetes mellitus, CBG's <200 but trending up  Morbid obesity 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

## 2016-05-11 NOTE — Progress Notes (Signed)
Physical Therapy Treatment Patient Details Name: Margaret [name] MRN: [id number] DOB: 1956/02/11 Today's Date: 05/11/2016    History of Present Illness Margaret [name] is a 60 y.o. female admitted with acute  CHF exacerbation.  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 Underwent MVR on 6/8.    PT Comments    Pt with much improved cognition and improving mobility. Pt motivated to maximize her independence.  Follow Up Recommendations  CIR;Supervision/Assistance - 24 hour     Equipment Recommendations  Wheelchair (measurements PT);Wheelchair cushion (measurements PT);Hospital bed (hoyer lift)    Recommendations for Other Services Rehab consult     Precautions / Restrictions Precautions Precautions: Fall;Sternal Precaution Comments: L hemiparesis; L neglect; L subluxed shoulder Required Braces or Orthoses: Other Brace/Splint (left shoulder taped) Restrictions LLE Weight Bearing: Weight bearing as tolerated    Mobility  Bed Mobility                  Transfers Overall transfer level: Needs assistance Equipment used: Ambulation equipment used Transfers: Sit to/from Stand Sit to Stand: Max assist;Mod assist         General transfer comment: Assist to bring hips and trunk up. Verbal cues to not pull heavily on RUE  Ambulation/Gait                 Stairs            Wheelchair Mobility    Modified Rankin (Stroke Patients Only)       Balance   Sitting-balance support: No upper extremity supported Sitting balance-Leahy Scale: Fair Sitting balance - Comments: Pt sat edge of chair with supervision.   Standing balance support: Single extremity supported Standing balance-Leahy Scale: Poor Standing balance comment: pt stood  with Stedy twice x 1 minute with min to mod A to maintain standing. Lt knee support from shin pad of Stedy. Verbal/tactile cues for facilitation of hip and trunk extension.                    Cognition Arousal/Alertness: Awake/alert Behavior During Therapy: WFL for tasks assessed/performed Overall Cognitive Status: Within Functional Limits for tasks assessed                      Exercises General Exercises - Lower Extremity Long Arc Quad: PROM;Left;5 reps;Seated    General Comments        Pertinent Vitals/Pain Pain Assessment: No/denies pain    Home Living                      Prior Function            PT Goals (current goals can now be found in the care plan section) Progress towards PT goals: Progressing toward goals    Frequency  Min 3X/week    PT Plan Current plan remains appropriate    Co-evaluation             End of Session Equipment Utilized During Treatment: Gait belt;Oxygen Activity Tolerance: Patient tolerated treatment well Patient left: with call bell/phone within reach;in chair;with family/visitor present     Time: 1138-1200 PT Time Calculation (min) (ACUTE ONLY): 22 min  Charges:  $Therapeutic Activity: 8-22 mins  G Codes:      Margaret [name] 05/11/2016, 2:42 PM [facility] PT [phone]

## 2016-05-12 ENCOUNTER — Inpatient Hospital Stay (HOSPITAL_COMMUNITY)
Admission: RE | Admit: 2016-05-12 | Discharge: 2016-06-04 | DRG: 947 | Disposition: A | Discharge status: Home Health | Payer: Medicaid Other | Source: Intra-hospital | Attending: Physical Medicine & Rehabilitation | Admitting: Physical Medicine & Rehabilitation

## 2016-05-12 DIAGNOSIS — I69354 Hemiplegia and hemiparesis following cerebral infarction affecting left non-dominant side: Secondary | ICD-10-CM | POA: Diagnosis not present

## 2016-05-12 DIAGNOSIS — Z794 Long term (current) use of insulin: Secondary | ICD-10-CM

## 2016-05-12 DIAGNOSIS — F410 Panic disorder [episodic paroxysmal anxiety] without agoraphobia: Secondary | ICD-10-CM | POA: Diagnosis present

## 2016-05-12 DIAGNOSIS — K5900 Constipation, unspecified: Secondary | ICD-10-CM | POA: Diagnosis not present

## 2016-05-12 DIAGNOSIS — F4322 Adjustment disorder with anxiety: Secondary | ICD-10-CM | POA: Diagnosis present

## 2016-05-12 DIAGNOSIS — I69922 Dysarthria following unspecified cerebrovascular disease: Secondary | ICD-10-CM | POA: Diagnosis not present

## 2016-05-12 DIAGNOSIS — I110 Hypertensive heart disease with heart failure: Secondary | ICD-10-CM | POA: Diagnosis present

## 2016-05-12 DIAGNOSIS — R001 Bradycardia, unspecified: Secondary | ICD-10-CM | POA: Diagnosis not present

## 2016-05-12 DIAGNOSIS — N179 Acute kidney failure, unspecified: Secondary | ICD-10-CM | POA: Diagnosis not present

## 2016-05-12 DIAGNOSIS — I69322 Dysarthria following cerebral infarction: Secondary | ICD-10-CM

## 2016-05-12 DIAGNOSIS — E785 Hyperlipidemia, unspecified: Secondary | ICD-10-CM | POA: Diagnosis present

## 2016-05-12 DIAGNOSIS — G8194 Hemiplegia, unspecified affecting left nondominant side: Secondary | ICD-10-CM

## 2016-05-12 DIAGNOSIS — I69359 Hemiplegia and hemiparesis following cerebral infarction affecting unspecified side: Secondary | ICD-10-CM | POA: Insufficient documentation

## 2016-05-12 DIAGNOSIS — F329 Major depressive disorder, single episode, unspecified: Secondary | ICD-10-CM | POA: Diagnosis present

## 2016-05-12 DIAGNOSIS — D62 Acute posthemorrhagic anemia: Secondary | ICD-10-CM | POA: Diagnosis present

## 2016-05-12 DIAGNOSIS — J449 Chronic obstructive pulmonary disease, unspecified: Secondary | ICD-10-CM | POA: Diagnosis present

## 2016-05-12 DIAGNOSIS — D72829 Elevated white blood cell count, unspecified: Secondary | ICD-10-CM | POA: Diagnosis present

## 2016-05-12 DIAGNOSIS — E1142 Type 2 diabetes mellitus with diabetic polyneuropathy: Secondary | ICD-10-CM | POA: Diagnosis present

## 2016-05-12 DIAGNOSIS — I10 Essential (primary) hypertension: Secondary | ICD-10-CM | POA: Diagnosis not present

## 2016-05-12 DIAGNOSIS — I272 Other secondary pulmonary hypertension: Secondary | ICD-10-CM | POA: Diagnosis present

## 2016-05-12 DIAGNOSIS — R609 Edema, unspecified: Secondary | ICD-10-CM | POA: Insufficient documentation

## 2016-05-12 DIAGNOSIS — I480 Paroxysmal atrial fibrillation: Secondary | ICD-10-CM | POA: Diagnosis not present

## 2016-05-12 DIAGNOSIS — I69959 Hemiplegia and hemiparesis following unspecified cerebrovascular disease affecting unspecified side: Secondary | ICD-10-CM | POA: Insufficient documentation

## 2016-05-12 DIAGNOSIS — R5381 Other malaise: Principal | ICD-10-CM | POA: Diagnosis present

## 2016-05-12 DIAGNOSIS — R252 Cramp and spasm: Secondary | ICD-10-CM | POA: Diagnosis not present

## 2016-05-12 DIAGNOSIS — I69321 Dysphasia following cerebral infarction: Secondary | ICD-10-CM | POA: Diagnosis not present

## 2016-05-12 DIAGNOSIS — I38 Endocarditis, valve unspecified: Secondary | ICD-10-CM | POA: Diagnosis present

## 2016-05-12 DIAGNOSIS — M25512 Pain in left shoulder: Secondary | ICD-10-CM | POA: Diagnosis not present

## 2016-05-12 DIAGNOSIS — M62838 Other muscle spasm: Secondary | ICD-10-CM | POA: Insufficient documentation

## 2016-05-12 DIAGNOSIS — Z87891 Personal history of nicotine dependence: Secondary | ICD-10-CM

## 2016-05-12 DIAGNOSIS — E669 Obesity, unspecified: Secondary | ICD-10-CM | POA: Diagnosis present

## 2016-05-12 DIAGNOSIS — Z7982 Long term (current) use of aspirin: Secondary | ICD-10-CM

## 2016-05-12 DIAGNOSIS — Z6837 Body mass index (BMI) 37.0-37.9, adult: Secondary | ICD-10-CM

## 2016-05-12 DIAGNOSIS — Z9981 Dependence on supplemental oxygen: Secondary | ICD-10-CM

## 2016-05-12 DIAGNOSIS — Z5181 Encounter for therapeutic drug level monitoring: Secondary | ICD-10-CM | POA: Insufficient documentation

## 2016-05-12 DIAGNOSIS — G8104 Flaccid hemiplegia affecting left nondominant side: Secondary | ICD-10-CM | POA: Diagnosis not present

## 2016-05-12 DIAGNOSIS — R1310 Dysphagia, unspecified: Secondary | ICD-10-CM | POA: Diagnosis present

## 2016-05-12 DIAGNOSIS — Z7401 Bed confinement status: Secondary | ICD-10-CM

## 2016-05-12 DIAGNOSIS — I959 Hypotension, unspecified: Secondary | ICD-10-CM | POA: Diagnosis not present

## 2016-05-12 DIAGNOSIS — I059 Rheumatic mitral valve disease, unspecified: Secondary | ICD-10-CM | POA: Diagnosis not present

## 2016-05-12 DIAGNOSIS — Z7901 Long term (current) use of anticoagulants: Secondary | ICD-10-CM

## 2016-05-12 DIAGNOSIS — I5033 Acute on chronic diastolic (congestive) heart failure: Secondary | ICD-10-CM | POA: Diagnosis present

## 2016-05-12 DIAGNOSIS — Z79899 Other long term (current) drug therapy: Secondary | ICD-10-CM

## 2016-05-12 DIAGNOSIS — J42 Unspecified chronic bronchitis: Secondary | ICD-10-CM | POA: Diagnosis not present

## 2016-05-12 DIAGNOSIS — K6289 Other specified diseases of anus and rectum: Secondary | ICD-10-CM

## 2016-05-12 DIAGNOSIS — I63231 Cerebral infarction due to unspecified occlusion or stenosis of right carotid arteries: Secondary | ICD-10-CM | POA: Diagnosis not present

## 2016-05-12 LAB — PROTIME-INR
INR: 1.99 — ABNORMAL HIGH (ref 0.00–1.49)
PROTHROMBIN TIME: 22.4 s — AB (ref 11.6–15.2)

## 2016-05-12 LAB — GLUCOSE, CAPILLARY
Glucose-Capillary: 132 mg/dL — ABNORMAL HIGH (ref 65–99)
Glucose-Capillary: 162 mg/dL — ABNORMAL HIGH (ref 65–99)
Glucose-Capillary: 127 mg/dL — ABNORMAL HIGH (ref 65–99)
Glucose-Capillary: 115 mg/dL — ABNORMAL HIGH (ref 65–99)

## 2016-05-12 MED ORDER — ATENOLOL 12.5 MG HALF TABLET
12.5000 mg | ORAL_TABLET | Freq: Two times a day (BID) | ORAL | Status: DC
  Administered 2016-05-14 – 2016-05-17 (×4): 12.5 mg via ORAL
  Filled 2016-05-12 (×10): qty 1

## 2016-05-12 MED ORDER — NYSTATIN 100000 UNIT/GM EX POWD: Freq: Two times a day (BID) | CUTANEOUS | Status: DC

## 2016-05-12 MED ORDER — NYSTATIN 100000 UNIT/GM EX POWD
Freq: Two times a day (BID) | CUTANEOUS | Status: DC
  Administered 2016-05-12 – 2016-05-21 (×18): via TOPICAL
  Administered 2016-05-22: 1 g via TOPICAL
  Administered 2016-05-22 – 2016-06-04 (×23): via TOPICAL
  Filled 2016-05-12 (×2): qty 15

## 2016-05-12 MED ORDER — CLONAZEPAM 0.5 MG PO TABS
0.5000 mg | ORAL_TABLET | Freq: Every day | ORAL | Status: DC
  Administered 2016-05-12 – 2016-06-03 (×22): 0.5 mg via ORAL
  Filled 2016-05-12 (×23): qty 1

## 2016-05-12 MED ORDER — FAMOTIDINE 20 MG PO TABS
20.0000 mg | ORAL_TABLET | Freq: Two times a day (BID) | ORAL | Status: DC
  Administered 2016-05-12 – 2016-06-04 (×45): 20 mg via ORAL
  Filled 2016-05-12 (×46): qty 1

## 2016-05-12 MED ORDER — INSULIN DETEMIR 100 UNIT/ML ~~LOC~~ SOLN
28.0000 [IU] | Freq: Every day | SUBCUTANEOUS | Status: DC
  Administered 2016-05-13 – 2016-05-23 (×11): 28 [IU] via SUBCUTANEOUS
  Filled 2016-05-12 (×12): qty 0.28

## 2016-05-12 MED ORDER — OXYCODONE HCL 5 MG PO TABS
5.0000 mg | ORAL_TABLET | ORAL | Status: DC | PRN
  Administered 2016-05-15: 10 mg via ORAL
  Administered 2016-05-28: 5 mg via ORAL
  Filled 2016-05-12: qty 1
  Filled 2016-05-12 (×2): qty 2

## 2016-05-12 MED ORDER — ALTEPLASE 2 MG IJ SOLR
2.0000 mg | Freq: Once | INTRAMUSCULAR | Status: AC
  Administered 2016-05-12: 2 mg

## 2016-05-12 MED ORDER — LEVALBUTEROL HCL 1.25 MG/0.5ML IN NEBU
1.2500 mg | INHALATION_SOLUTION | Freq: Four times a day (QID) | RESPIRATORY_TRACT | Status: DC | PRN
  Filled 2016-05-12: qty 0.5

## 2016-05-12 MED ORDER — TRAZODONE HCL 50 MG PO TABS
100.0000 mg | ORAL_TABLET | Freq: Every day | ORAL | Status: DC
  Administered 2016-05-12 – 2016-06-04 (×19): 100 mg via ORAL
  Filled 2016-05-12 (×22): qty 2

## 2016-05-12 MED ORDER — AMIODARONE HCL 400 MG PO TABS: 400.0000 mg | ORAL_TABLET | Freq: Two times a day (BID) | ORAL | Status: DC

## 2016-05-12 MED ORDER — INSULIN ASPART 100 UNIT/ML ~~LOC~~ SOLN
0.0000 [IU] | Freq: Three times a day (TID) | SUBCUTANEOUS | Status: DC
Start: 2016-05-13 — End: 2016-06-04
  Administered 2016-05-14: 3 [IU] via SUBCUTANEOUS
  Administered 2016-05-14: 4 [IU] via SUBCUTANEOUS
  Administered 2016-05-15 (×2): 3 [IU] via SUBCUTANEOUS
  Administered 2016-05-16: 4 [IU] via SUBCUTANEOUS
  Administered 2016-05-17: 7 [IU] via SUBCUTANEOUS
  Administered 2016-05-17: 4 [IU] via SUBCUTANEOUS
  Administered 2016-05-18: 3 [IU] via SUBCUTANEOUS
  Administered 2016-05-18: 4 [IU] via SUBCUTANEOUS
  Administered 2016-05-19 – 2016-05-20 (×4): 3 [IU] via SUBCUTANEOUS
  Administered 2016-05-20: 4 [IU] via SUBCUTANEOUS
  Administered 2016-05-21: 7 [IU] via SUBCUTANEOUS
  Administered 2016-05-21: 4 [IU] via SUBCUTANEOUS
  Administered 2016-05-22 (×3): 3 [IU] via SUBCUTANEOUS
  Administered 2016-05-23: 4 [IU] via SUBCUTANEOUS
  Administered 2016-05-23: 3 [IU] via SUBCUTANEOUS
  Administered 2016-05-23 – 2016-05-24 (×4): 4 [IU] via SUBCUTANEOUS
  Administered 2016-05-25 (×3): 3 [IU] via SUBCUTANEOUS
  Administered 2016-05-26: 7 [IU] via SUBCUTANEOUS
  Administered 2016-05-26: 4 [IU] via SUBCUTANEOUS
  Administered 2016-05-27: 3 [IU] via SUBCUTANEOUS
  Administered 2016-05-27: 11 [IU] via SUBCUTANEOUS
  Administered 2016-05-28: 3 [IU] via SUBCUTANEOUS
  Administered 2016-05-28: 4 [IU] via SUBCUTANEOUS
  Administered 2016-05-28: 3 [IU] via SUBCUTANEOUS
  Administered 2016-05-29: 4 [IU] via SUBCUTANEOUS
  Administered 2016-05-29 – 2016-05-30 (×4): 3 [IU] via SUBCUTANEOUS
  Administered 2016-05-30 – 2016-05-31 (×2): 7 [IU] via SUBCUTANEOUS
  Administered 2016-05-31 – 2016-06-01 (×4): 3 [IU] via SUBCUTANEOUS
  Administered 2016-06-01: 4 [IU] via SUBCUTANEOUS
  Administered 2016-06-02: 7 [IU] via SUBCUTANEOUS
  Administered 2016-06-02 – 2016-06-03 (×3): 3 [IU] via SUBCUTANEOUS
  Administered 2016-06-03: 4 [IU] via SUBCUTANEOUS
  Administered 2016-06-04: 3 [IU] via SUBCUTANEOUS

## 2016-05-12 MED ORDER — DOCUSATE SODIUM 100 MG PO CAPS
200.0000 mg | ORAL_CAPSULE | Freq: Every day | ORAL | Status: DC
  Administered 2016-05-13 – 2016-05-16 (×4): 200 mg via ORAL
  Filled 2016-05-12 (×4): qty 2

## 2016-05-12 MED ORDER — ESCITALOPRAM OXALATE 10 MG PO TABS: 10.0000 mg | ORAL_TABLET | Freq: Every day | ORAL | Status: DC

## 2016-05-12 MED ORDER — PATIENT'S GUIDE TO USING COUMADIN BOOK
Freq: Once | Status: AC
  Administered 2016-05-12: 1
  Filled 2016-05-12: qty 1

## 2016-05-12 MED ORDER — TRAMADOL HCL 50 MG PO TABS
50.0000 mg | ORAL_TABLET | ORAL | Status: DC | PRN
  Administered 2016-05-12 – 2016-05-13 (×2): 100 mg via ORAL
  Administered 2016-05-17 – 2016-05-19 (×2): 50 mg via ORAL
  Administered 2016-05-20 – 2016-05-24 (×2): 100 mg via ORAL
  Administered 2016-05-31 – 2016-06-01 (×2): 50 mg via ORAL
  Filled 2016-05-12: qty 2
  Filled 2016-05-12 (×3): qty 1
  Filled 2016-05-12 (×5): qty 2

## 2016-05-12 MED ORDER — SPIRONOLACTONE 25 MG PO TABS
50.0000 mg | ORAL_TABLET | Freq: Every day | ORAL | Status: DC
  Administered 2016-05-13 – 2016-06-04 (×23): 50 mg via ORAL
  Filled 2016-05-12 (×23): qty 2

## 2016-05-12 MED ORDER — CLONAZEPAM 0.5 MG PO TABS
0.5000 mg | ORAL_TABLET | Freq: Three times a day (TID) | ORAL | Status: DC | PRN
  Administered 2016-05-14 – 2016-06-02 (×11): 0.5 mg via ORAL
  Filled 2016-05-12 (×10): qty 1

## 2016-05-12 MED ORDER — POTASSIUM CHLORIDE 20 MEQ PO PACK: 20.0000 meq | PACK | Freq: Two times a day (BID) | ORAL | Status: DC

## 2016-05-12 MED ORDER — FUROSEMIDE 80 MG PO TABS
80.0000 mg | ORAL_TABLET | Freq: Every day | ORAL | Status: DC
  Administered 2016-05-13 – 2016-05-14 (×2): 80 mg via ORAL
  Filled 2016-05-12 (×2): qty 1

## 2016-05-12 MED ORDER — WARFARIN SODIUM 7.5 MG PO TABS: 7.5000 mg | ORAL_TABLET | Freq: Every day | ORAL | Status: DC

## 2016-05-12 MED ORDER — ESCITALOPRAM OXALATE 10 MG PO TABS
10.0000 mg | ORAL_TABLET | Freq: Every day | ORAL | Status: DC
  Administered 2016-05-12 – 2016-06-03 (×23): 10 mg via ORAL
  Filled 2016-05-12 (×23): qty 1

## 2016-05-12 MED ORDER — ONDANSETRON HCL 4 MG/2ML IJ SOLN: 4.0000 mg | Freq: Four times a day (QID) | INTRAMUSCULAR | Status: DC | PRN

## 2016-05-12 MED ORDER — GERHARDT'S BUTT CREAM: 1.0000 "application " | TOPICAL_CREAM | CUTANEOUS | Status: DC | PRN

## 2016-05-12 MED ORDER — SORBITOL 70 % SOLN
30.0000 mL | Freq: Every day | Status: DC | PRN
  Administered 2016-05-13 – 2016-05-26 (×3): 30 mL via ORAL
  Filled 2016-05-12 (×3): qty 30

## 2016-05-12 MED ORDER — ASPIRIN EC 81 MG PO TBEC
81.0000 mg | DELAYED_RELEASE_TABLET | Freq: Every day | ORAL | Status: DC
  Administered 2016-05-13 – 2016-06-04 (×23): 81 mg via ORAL
  Filled 2016-05-12 (×23): qty 1

## 2016-05-12 MED ORDER — AMIODARONE HCL 200 MG PO TABS
400.0000 mg | ORAL_TABLET | Freq: Two times a day (BID) | ORAL | Status: DC
  Administered 2016-05-12 – 2016-05-17 (×10): 400 mg via ORAL
  Filled 2016-05-12 (×10): qty 2

## 2016-05-12 MED ORDER — POTASSIUM CHLORIDE 20 MEQ PO PACK
20.0000 meq | PACK | Freq: Two times a day (BID) | ORAL | Status: DC
  Administered 2016-05-12: 20 meq via ORAL
  Filled 2016-05-12 (×2): qty 1

## 2016-05-12 MED ORDER — ONDANSETRON HCL 4 MG PO TABS
4.0000 mg | ORAL_TABLET | Freq: Four times a day (QID) | ORAL | Status: DC | PRN
  Administered 2016-05-21 – 2016-05-25 (×2): 4 mg via ORAL
  Filled 2016-05-12 (×2): qty 1

## 2016-05-12 MED ORDER — GERHARDT'S BUTT CREAM
TOPICAL_CREAM | CUTANEOUS | Status: DC | PRN
  Administered 2016-05-14 – 2016-05-24 (×3): via TOPICAL
  Filled 2016-05-12: qty 1

## 2016-05-12 NOTE — Progress Notes (Signed)
Ankit Lorie Phenix, MD Physician Signed Physical Medicine and Rehabilitation Consult Note 05/06/2016 11:28 AM  Related encounter: Admission (Current) from 04/14/2016 in [place] Collapse All        Physical Medicine and Rehabilitation Consult Reason for Consult: Debilitation after mitral valve repair Referring Physician: Dr. Roxy Manns   HPI: Margaret [name] is a 60 y.o. right handed female with history of subarachnoid hemorrhage left parietal occipital region and cortical subcortical ischemic infarcts, dysarthria, dysphagia March 2017 and discharged to skilled nursing facility with hospital course complicated by Streptococcus viridans and findings of severe mitral regurgitation, type 2 diabetes mellitus, COPD, splenic infarct February 2017 while maintained on xarelto. At the nursing facility patient remained essentially bed bound. Prior nursing home placement patient live with her husband. She has assistance from her husband's sister's son and family. One level home. Presented 04/23/2016 plan mitral valve surgery and underwent mitral valve repair 04/30/2016 per Dr. Roxy Manns. Hospital course pain management. Dysphagia maintain on dysphagia #1 thin liquid diet. TPN initiated for nutritional support. Subcutaneous Lovenox for DVT prophylaxis. Acute on chronic blood loss anemia hemoglobin 9.8. Physical and occupational therapy ongoing family requesting to have patient discharged to home instead of back to skilled nursing facility. Recommendations for physical medicine rehabilitation consult.   Review of Systems  Constitutional: Negative for fever and chills.  HENT: Negative for hearing loss.  Eyes: Negative for blurred vision and double vision.  Respiratory: Positive for cough and shortness of breath.  Cardiovascular: Positive for palpitations and leg swelling. Negative for chest pain.  Gastrointestinal: Positive for constipation. Negative for nausea and  vomiting.  Genitourinary: Negative for dysuria and hematuria.  Musculoskeletal: Positive for myalgias, back pain and joint pain.  Skin: Negative for rash.  Neurological: Positive for speech change, focal weakness and weakness. Negative for seizures and headaches.  Psychiatric/Behavioral: Positive for depression.   Panic attacks  All other systems reviewed and are negative.  Past Medical History  Diagnosis Date  . Hypertension   . Hyperlipidemia   . COPD (chronic obstructive pulmonary disease) ([place]) 04/20/12  . Asthma   . Type II diabetes mellitus ([place])   . Anemia   . H/O hiatal hernia   . Osteoarthritis   . Pneumonia 04/2012  . Migraines   . Panic attacks 04/20/12  . Morbid obesity ([place])   . Heart murmur   . Bacterial endocarditis     Strep viridans   . Chronic diastolic CHF (congestive heart failure) ([place]) 04/27/2012  . Pulmonary hypertension assoc with unclear multi-factorial mechanisms ([place]) 01/23/2016  . Positive ANA (antinuclear antibody)   . 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 Stroke ([place])   . Subarachnoid bleed ([place]) 02/01/2016  . S/P mitral valve repair 04/30/2016    Complex valvuloplasty including autologous pericardial patch repair of perforated posterior leaflet, artificial Gore-tex neochord placement x6 and 26 mm Sorin Memo 3D ring annuloplasty   Past Surgical History  Procedure Laterality Date  . Laceration repair  1966    "right upper arm"  . Dilation and curettage of uterus  1990's  . Finger reattachment  1973    "right ring finger"  . Tee without cardioversion N/A 02/05/2016    Procedure: TRANSESOPHAGEAL ECHOCARDIOGRAM (TEE); Surgeon: Larey Dresser, MD; Location: [place]; Service: Cardiovascular; Laterality: N/A;  . Radiology with anesthesia N/A 02/06/2016    Procedure: RADIOLOGY WITH ANESTHESIA; Surgeon: Luanne Bras, MD; Location: [place];  Service: Radiology; Laterality: N/A;  . Cardiac catheterization  N/A 04/23/2016    Procedure: Right/Left Heart Cath and Coronary Angiography; Surgeon: Sherren Mocha, MD; Location: [place] [facility]; Service: Cardiovascular; Laterality: N/A;  . Mitral valve repair N/A 04/30/2016    Procedure: MITRAL VALVE REPAIR ; Surgeon: Rexene Alberts, MD; Location: [place]; Service: Open Heart Surgery; Laterality: N/A;  . Tee without cardioversion N/A 04/30/2016    Procedure: TRANSESOPHAGEAL ECHOCARDIOGRAM (TEE); Surgeon: Rexene Alberts, MD; Location: [place]; Service: Open Heart Surgery; Laterality: N/A;   Family History  Problem Relation Age of Onset  . Allergies    . Hypertension    . Coronary artery disease Mother 41  . Stroke Father   . COPD Father   . Clotting disorder Maternal Grandmother     died from blood clot   Social History:  reports that she quit smoking about 4 years ago. Her smoking use included Cigarettes. She has a 8 pack-year smoking history. She has never used smokeless tobacco. She reports that she does not drink alcohol or use illicit drugs. Allergies:  Allergies  Allergen Reactions  . Codeine Hives, Itching and Other (See Comments)    "breathing problems"   Medications Prior to Admission  Medication Sig Dispense Refill  . Alogliptin-Metformin HCl 12.03-999 MG TABS Take 1 tablet by mouth 2 (two) times daily.    Marland Kitchen aspirin 81 MG tablet Take 81 mg by mouth daily.    Marland Kitchen atenolol (TENORMIN) 25 MG tablet Take 12.5 mg by mouth 2 (two) times daily.     . [EXPIRED] cefTRIAXone (ROCEPHIN) 2 g SOLR injection Inject 2 g into the vein daily. 10 each 0  . cholecalciferol (VITAMIN D) 1000 units tablet Take 1,000 Units by mouth daily.     . clonazePAM (KLONOPIN) 0.5 MG tablet Take 0.5 mg by mouth at bedtime.    . clonazePAM (KLONOPIN) 0.5 MG tablet Take 0.25 mg by mouth 3 (three) times  daily as needed for anxiety.    . furosemide (LASIX) 40 MG tablet Take 40 mg by mouth 2 (two) times daily.    . insulin NPH-regular Human (NOVOLIN 70/30) (70-30) 100 UNIT/ML injection Inject 6-21 Units into the skin 2 (two) times daily with a meal. Patient states she takes 70/30 6 units plus 1 additional unit for every 10 mg/dl over glucose of 150 mg/dl (with max being 300 mg/dl) BID    . lovastatin (MEVACOR) 10 MG tablet Take 10 mg by mouth at bedtime.    . OXYGEN Inhale 2 L into the lungs continuous.    . polyethylene glycol (MIRALAX / GLYCOLAX) packet Take 17 g by mouth daily.    Marland Kitchen senna-docusate (SENOKOT-S) 8.6-50 MG tablet Take 1 tablet by mouth 2 (two) times daily.    . sertraline (ZOLOFT) 25 MG tablet Take 25 mg by mouth daily.    Marland Kitchen spironolactone (ALDACTONE) 25 MG tablet Take 50 mg by mouth daily. Reported on 04/09/2016    . traMADol (ULTRAM) 50 MG tablet Take 50 mg by mouth every 6 (six) hours as needed for moderate pain.    . traZODone (DESYREL) 100 MG tablet Take 100 mg by mouth at bedtime.    Marland Kitchen albuterol (PROVENTIL HFA;VENTOLIN HFA) 108 (90 BASE) MCG/ACT inhaler Inhale 2 puffs into the lungs every 4 (four) hours as needed. For shortness of breath.    Marland Kitchen albuterol (PROVENTIL) (2.5 MG/3ML) 0.083% nebulizer solution Take 2.5 mg by nebulization at bedtime.     . ALPRAZolam (XANAX) 0.5 MG tablet Take 0.5 mg by mouth 3 (three)  times daily as needed for anxiety. Reported on 04/09/2016    . nitroGLYCERIN (NITROSTAT) 0.4 MG SL tablet Place 1 tablet (0.4 mg total) under the tongue every 5 (five) minutes x 3 doses as needed for chest pain. 25 tablet 3    Home: Home Living Family/patient expects to be discharged to:: Skilled nursing facility Additional Comments: spouse states hoped to be able to take pt home, but did not get ramp completed. States SW was supposed to be working on it.   Functional History: Prior  Function Level of Independence: Needs assistance Gait / Transfers Assistance Needed: at SNF ambulating a little in parallel bars, assist for transfers and gets around in w/c ADL's / Homemaking Assistance Needed: Pt requires assist for ADLs  Functional Status:  Mobility: Bed Mobility Overal bed mobility: +2 for physical assistance Bed Mobility: Supine to Sit Rolling: (to Rt with RUE on rail) Sidelying to sit: Max assist, +2 for physical assistance, +2 for safety/equipment Supine to sit: Max assist, +2 for physical assistance, HOB elevated Sit to supine: +2 for physical assistance, Total assist General bed mobility comments: Due to sternal precuaitons, pt requires increased assistance for bed mobility Transfers Overall transfer level: Needs assistance Equipment used: 2 person hand held assist Transfer via Lift Equipment: [employer] Transfers: Sit to/from Stand, [employer] Transfers Sit to Stand: Mod assist, +2 physical assistance, From elevated surface Stand pivot transfers: Mod assist, +2 physical assistance Squat pivot transfers: Mod assist, From elevated surface General transfer comment: Brought Denna Haggard but unable to even get pt positioned for attempt due to very heavy lt lean sitting EOB. Ambulation/Gait General Gait Details: unable; performed standing weight shifts Lt, Rt    ADL: ADL Overall ADL's : Needs assistance/impaired Eating/Feeding: Set up, Supervision/ safety, Cueing for safety Eating/Feeding Details (indicate cue type and reason): just resumed water and puree diet. Pt issued plate guard - educated pt/husband and NT on use Grooming: Wash/dry face, Oral care, Set up, Supervision/safety, Sitting Upper Body Bathing: Minimal assitance, Sitting Lower Body Bathing: Maximal assistance, +2 for physical assistance, Bed level (for rolling) Upper Body Dressing : Maximal assistance Upper Body Dressing Details (indicate cue type and reason): sitting EOB Lower Body Dressing:  Moderate assistance, Sit to/from stand Lower Body Dressing Details (indicate cue type and reason): Pt is able to don socks with supervision EOB. Mod A to move sit to stand  Toilet Transfer: Moderate assistance, Stand-pivot, Squat-pivot, BSC Toilet Transfer Details (indicate cue type and reason): Pt requires cues for technique and to pivot Lt foot  Toileting- Clothing Manipulation and Hygiene: Maximal assistance, Sit to/from stand Functional mobility during ADLs: (use of sky lift) General ADL Comments: Pt now with sternal precautions  Cognition: Cognition Overall Cognitive Status: Impaired/Different from baseline Orientation Level: Oriented to person, Oriented to place, Oriented to time, Disoriented to situation Cognition Arousal/Alertness: Awake/alert Behavior During Therapy: Impulsive, Flat affect Overall Cognitive Status: Impaired/Different from baseline  Blood pressure 114/66, pulse 60, temperature 97.6 F (36.4 C), temperature source Oral, resp. rate 20, height 5\' 5"  (1.651 m), weight 111.1 kg (244 lb 14.9 oz), SpO2 100 %. Physical Exam  Vitals reviewed. Constitutional: She appears well-developed.  Obese  HENT:  Head: Normocephalic and atraumatic.  Eyes: EOM are normal. Right eye exhibits no discharge. Left eye exhibits no discharge.  Neck: Normal range of motion. Neck supple. No thyromegaly present.  Cardiovascular: Regular rhythm.  Bradycardia  Respiratory: No respiratory distress.  Decreased breath sounds at the bases with limited inspiratory effort +  GI:  Soft. Bowel sounds are normal.  Musculoskeletal: She exhibits edema. She exhibits no tenderness.  Neurological: She is alert. A cranial nerve deficit is present.  Flat affect.  Severe dysarthria but intelligible.  He is able to provide her name and age in place.  Follows simple commands DTRs symmetric RUE/RLE: 4+/5 proximal to distal LUE/LLE: 0/5  Skin: Skin is warm and dry.  Chest incision clean dry and  dressed  Psychiatric: Her affect is blunt. Her speech is delayed. She is slowed.     Lab Results Last 24 Hours    Results for orders placed or performed during the hospital encounter of 04/14/16 (from the past 24 hour(s))  Glucose, capillary Status: Abnormal   Collection Time: 05/05/16 11:43 AM  Result Value Ref Range   Glucose-Capillary 135 (H) 65 - 99 mg/dL   Comment 1 Capillary Specimen    Comment 2 Notify RN   Glucose, capillary Status: Abnormal   Collection Time: 05/05/16 3:41 PM  Result Value Ref Range   Glucose-Capillary 110 (H) 65 - 99 mg/dL   Comment 1 Capillary Specimen    Comment 2 Notify RN   Glucose, capillary Status: Abnormal   Collection Time: 05/05/16 7:13 PM  Result Value Ref Range   Glucose-Capillary 157 (H) 65 - 99 mg/dL   Comment 1 Capillary Specimen    Comment 2 Notify RN    Comment 3 Document in Chart   Glucose, capillary Status: Abnormal   Collection Time: 05/06/16 12:06 AM  Result Value Ref Range   Glucose-Capillary 128 (H) 65 - 99 mg/dL   Comment 1 Capillary Specimen    Comment 2 Notify RN    Comment 3 Document in Chart   Glucose, capillary Status: Abnormal   Collection Time: 05/06/16 4:18 AM  Result Value Ref Range   Glucose-Capillary 106 (H) 65 - 99 mg/dL   Comment 1 Capillary Specimen    Comment 2 Notify RN    Comment 3 Document in Chart   Comprehensive metabolic panel Status: Abnormal   Collection Time: 05/06/16 7:06 AM  Result Value Ref Range   Sodium 139 135 - 145 mmol/L   Potassium 3.3 (L) 3.5 - 5.1 mmol/L   Chloride 95 (L) 101 - 111 mmol/L   CO2 34 (H) 22 - 32 mmol/L   Glucose, Bld 118 (H) 65 - 99 mg/dL   BUN 13 6 - 20 mg/dL   Creatinine, Ser 0.73 0.44 - 1.00 mg/dL   Calcium 10.7 (H) 8.9 - 10.3 mg/dL   Total Protein 6.5 6.5 - 8.1 g/dL   Albumin 2.6 (L) 3.5 -  5.0 g/dL   AST 17 15 - 41 U/L   ALT 13 (L) 14 - 54 U/L   Alkaline Phosphatase 69 38 - 126 U/L   Total Bilirubin 0.6 0.3 - 1.2 mg/dL   GFR calc non Af Amer >60 >60 mL/min   GFR calc Af Amer >60 >60 mL/min   Anion gap 10 5 - 15  CBC with Differential/Platelet Status: Abnormal   Collection Time: 05/06/16 7:06 AM  Result Value Ref Range   WBC 14.7 (H) 4.0 - 10.5 K/uL   RBC 3.78 (L) 3.87 - 5.11 MIL/uL   Hemoglobin 9.8 (L) 12.0 - 15.0 g/dL   HCT 33.7 (L) 36.0 - 46.0 %   MCV 89.2 78.0 - 100.0 fL   MCH 25.9 (L) 26.0 - 34.0 pg   MCHC 29.1 (L) 30.0 - 36.0 g/dL   RDW 17.6 (H) 11.5 - 15.5 %  Platelets 567 (H) 150 - 400 K/uL   Neutrophils Relative % 69 %   Neutro Abs 10.1 (H) 1.7 - 7.7 K/uL   Lymphocytes Relative 16 %   Lymphs Abs 2.3 0.7 - 4.0 K/uL   Monocytes Relative 8 %   Monocytes Absolute 1.1 (H) 0.1 - 1.0 K/uL   Eosinophils Relative 8 %   Eosinophils Absolute 1.1 (H) 0.0 - 0.7 K/uL   Basophils Relative 1 %   Basophils Absolute 0.1 0.0 - 0.1 K/uL  Magnesium Status: None   Collection Time: 05/06/16 7:06 AM  Result Value Ref Range   Magnesium 1.8 1.7 - 2.4 mg/dL  Prealbumin Status: Abnormal   Collection Time: 05/06/16 7:06 AM  Result Value Ref Range   Prealbumin 8.9 (L) 18 - 38 mg/dL  Phosphorus Status: None   Collection Time: 05/06/16 7:06 AM  Result Value Ref Range   Phosphorus 2.8 2.5 - 4.6 mg/dL  Protime-INR Status: Abnormal   Collection Time: 05/06/16 7:06 AM  Result Value Ref Range   Prothrombin Time 18.3 (H) 11.6 - 15.2 seconds   INR 1.52 (H) 0.00 - 1.49  Glucose, capillary Status: Abnormal   Collection Time: 05/06/16 8:07 AM  Result Value Ref Range   Glucose-Capillary 116 (H) 65 - 99 mg/dL   Comment 1 Notify RN       Imaging Results (Last 48 hours)    Dg Chest Port 1  View  05/05/2016 CLINICAL DATA: 60 year old female with shortness of Breath. Recent cardiothoracic surgery 04/30/2016 with complex valvuloplasty for mitral valve repair. Initial encounter. EXAM: PORTABLE CHEST 1 VIEW COMPARISON: 05/04/2016 and earlier. FINDINGS: Portable AP semi upright view at 0629 hours. Left chest tube has been removed. No pneumothorax. Stable right IJ central line. There may be small epicardial pacer wires still in place. Stable cardiomegaly and mediastinal contours. Regressed but not resolved pulmonary vascular congestion/edema since yesterday. No large pleural effusions. No areas of worsening ventilation. IMPRESSION: 1. Left chest tube removed. No pneumothorax. 2. Regressed pulmonary edema since yesterday. 3. No new cardiopulmonary abnormality. Electronically Signed By: Genevie Ann M.D. On: 05/05/2016 07:18     Assessment/Plan: Diagnosis: Debilitation after mitral valve repair Labs and images independently reviewed. Records reviewed and summated above.  1. Does the need for close, 24 hr/day medical supervision in concert with the patient's rehab needs make it unreasonable for this patient to be served in a less intensive setting? Yes  2. Co-Morbidities requiring supervision/potential complications: subarachnoid hemorrhage left parietal occipital region and cortical subcortical ischemic infarcts (cont therapies), dysarthria (cont SLP), dysphagia (cont SLP, advance diet as tolerated), COPD (follow RR and O2 sats with increased physical activity), splenic infarct, tachypnea (monitor RR and O2 Sats with increased physical exertion), bradycardia (follow HR with increased physical activity for appropriate response), DM (Monitor in accordance with exercise and adjust meds as necessary), hypokalemia (continue to monitor and replete as necessary), leukocytosis (cont to monitor for signs and symptoms of infection, further workup if indicated), SIRS, ABLA (transfuse if necessary to  ensure appropriate perfusion for increased activity tolerance), thrombocytosis (cont to follow, likely reactive), CHF (Monitor in accordance with increased physical activity and avoid UE resistance excercises) 3. Due to bladder management, safety, skin/wound care, disease management, medication administration and patient education, does the patient require 24 hr/day rehab nursing? Yes 4. Does the patient require coordinated care of a physician, rehab nurse, PT (1-2 hrs/day, 5 days/week), OT (1-2 hrs/day, 5 days/week) and SLP (1-2 hrs/day, 5 days/week) to address physical and functional deficits in  the context of the above medical diagnosis(es)? Yes Addressing deficits in the following areas: balance, endurance, locomotion, strength, transferring, bowel/bladder control, bathing, dressing, feeding, grooming, toileting, speech, swallowing and psychosocial support 5. Can the patient actively participate in an intensive therapy program of at least 3 hrs of therapy per day at least 5 days per week? No 6. The potential for patient to make measurable gains while on inpatient rehab is poor 7. Anticipated functional outcomes upon discharge from inpatient rehab are n/a with PT, n/a with OT, n/a with SLP. 8. Estimated rehab length of stay to reach the above functional goals is: NA 9. Does the patient have adequate social supports and living environment to accommodate these discharge functional goals? Potentially 10. Anticipated D/C setting: SNF 11. Anticipated post D/C treatments: SNF 12. Overall Rehab/Functional Prognosis: fair  RECOMMENDATIONS: This patient's condition is appropriate for continued rehabilitative care in the following setting: Will need to clarify availability of caregiver support as discharge as pt will need significant assistance for ADLs 24/7 from mulitple caregivers. However, pt does not appear to be able to tolerate 3 hours therapy/day. At present, recommend SNF. Will cont to follow as  medical issues stabilize. Patient has agreed to participate in recommended program. Yes Note that insurance prior authorization may be required for reimbursement for recommended care.  Comment: Rehab Admissions Coordinator to follow up.  Delice Lesch, MD 05/06/2016       Revision History     Date/Time User Provider Type Action   05/06/2016 2:56 PM Ankit Lorie Phenix, MD Physician Sign   05/06/2016 11:46 AM Cathlyn Parsons, PA-C Physician Assistant Pend   View Details Report       Routing History     Date/Time From To Method   05/06/2016 3:28 PM Rexene Alberts, MD Ankit Lorie Phenix, MD In [facility]   05/06/2016 2:56 PM Ankit Lorie Phenix, MD Harvie Junior, MD Fax

## 2016-05-12 NOTE — PMR Pre-admission (Signed)
PMR Admission Coordinator Pre-Admission Assessment  Patient: Margaret [name] is an 60 y.o., female MRN: [id number] DOB: [birth year] Height: 5\' 5"  (165.1 cm) Weight: 105.008 kg (231 lb 8 oz)              Insurance Information  PRIMARY: uninsured       Medicaid Application Date:       Case Manager:  Disability Application Date:       Case Worker:  Spouse has been working with SW at [employer] for [employer] and disability applications but needs to provide bank statements which have been unavailable  Emergency Contact Information Contact Information    Name Relation Home Work Mobile   Vandehei,William Spouse   [phone]     Current Medical History  Patient Admitting Diagnosis: debility after mitral valve repair, h/o r CVA  History of Present Illness: Margaret [name] is a 60 y.o. 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 Presented 04/23/2016 plan mitral valve surgery and underwent mitral valve repair 04/30/2016 per Dr. Roxy Manns. Sternal precautions as directed. Hospital course pain management. Patient was short run of PAF with RVR placed on amiodarone with follow-up for cardiology per services. Dysphagia maintain on dysphagia #3 thin liquid diet. TPN initiated for nutritional support and discontinued as nutrition improved. Placed on Coumadin therapy after mitral valve repair. Acute on chronic blood loss anemia hemoglobin 10.4 . patient with fluid overload maintained on Lasix infusion and close monitoring of renal function. Physical and occupational therapy ongoing family requesting to have patient  discharged to home instead of back to skilled nursing facility.  Past Medical History  Past Medical History  Diagnosis Date  . Hypertension   . Hyperlipidemia   . COPD (chronic obstructive pulmonary disease) ([place]) 04/20/12  . Asthma   . Type II diabetes mellitus ([place])   . Anemia   . H/O hiatal hernia   . Osteoarthritis   . Pneumonia 04/2012  . Migraines   . Panic attacks 04/20/12  . Morbid obesity ([place])   . Heart murmur   . Bacterial endocarditis     Strep viridans   . Chronic diastolic CHF (congestive heart failure) ([place]) 04/27/2012  . Pulmonary hypertension assoc with unclear multi-factorial mechanisms ([place]) 01/23/2016  . Positive ANA (antinuclear antibody)   . 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 Stroke ([place])   . Subarachnoid bleed ([place]) 02/01/2016  . S/P mitral valve repair 04/30/2016    Complex valvuloplasty including autologous pericardial patch repair of perforated posterior leaflet, artificial Gore-tex neochord placement x6 and 26 mm Sorin Memo 3D ring annuloplasty    Family History  family history includes COPD in her father; Clotting disorder in her maternal grandmother; Coronary artery disease (age of onset: 94) in her mother; Stroke in her father.  Prior Rehab/Hospitalizations:  Has the patient had major surgery during 100 days prior to admission? No Prior to CVA 3/17 pt was independent, driving and working. Has been at [facility] 3/17 until admit 6/1 to receive rehab and IV antibiotics.  Current Medications   Current facility-administered medications:  .  0.9 %  sodium chloride infusion, , Intravenous, Continuous, Rexene Alberts, MD, Last Rate: 10 mL/hr at 05/11/16 1707 .  amiodarone (PACERONE) tablet 400  mg, 400 mg, Oral, BID, Gaye Pollack, MD, 400 mg at 05/12/16 1137 .  antiseptic oral rinse (CPC / CETYLPYRIDINIUM CHLORIDE 0.05%) solution 7 mL, 7 mL, Mouth Rinse, q12n4p, Rexene Alberts, MD, 7 mL at 05/10/16 1600 .  aspirin EC tablet 81 mg, 81 mg, Oral, Daily, 81 mg  at 05/12/16 1137 **OR** [DISCONTINUED] aspirin chewable tablet 324 mg, 324 mg, Per Tube, Daily, Rexene Alberts, MD, 324 mg at 05/01/16 1012 .  atenolol (TENORMIN) tablet 12.5 mg, 12.5 mg, Oral, BID, Rexene Alberts, MD, 12.5 mg at 05/12/16 1153 .  bisacodyl (DULCOLAX) EC tablet 10 mg, 10 mg, Oral, Daily, 10 mg at 05/12/16 1201 **OR** bisacodyl (DULCOLAX) suppository 10 mg, 10 mg, Rectal, Daily, Rexene Alberts, MD, 10 mg at 05/02/16 0907 .  chlorhexidine (PERIDEX) 0.12 % solution 15 mL, 15 mL, Mouth Rinse, BID, Rexene Alberts, MD, 15 mL at 05/11/16 2157 .  clonazePAM (KLONOPIN) tablet 0.5 mg, 0.5 mg, Oral, QHS, Rexene Alberts, MD, 0.5 mg at 05/11/16 2158 .  clonazePAM (KLONOPIN) tablet 0.5 mg, 0.5 mg, Oral, TID PRN, Rexene Alberts, MD, 0.5 mg at 05/12/16 0124 .  docusate sodium (COLACE) capsule 200 mg, 200 mg, Oral, Daily, Rexene Alberts, MD, 200 mg at 05/12/16 1201 .  escitalopram (LEXAPRO) tablet 10 mg, 10 mg, Oral, QHS, Rexene Alberts, MD, 10 mg at 05/11/16 2158 .  famotidine (PEPCID) tablet 20 mg, 20 mg, Oral, BID, Rebecka Apley, RPH, 20 mg at 05/12/16 1208 .  furosemide (LASIX) tablet 80 mg, 80 mg, Oral, Daily, Rexene Alberts, MD, 80 mg at 05/12/16 1137 .  Gerhardt's butt cream, , Topical, PRN, Rexene Alberts, MD .  insulin aspart (novoLOG) injection 0-20 Units, 0-20 Units, Subcutaneous, TID WC, Rexene Alberts, MD, 4 Units at 05/12/16 1207 .  insulin detemir (LEVEMIR) injection 28 Units, 28 Units, Subcutaneous, Daily, Rexene Alberts, MD, 28 Units at 05/12/16 1135 .  levalbuterol (XOPENEX) nebulizer solution 1.25 mg, 1.25 mg, Nebulization, Q6H PRN, Rexene Alberts, MD .  nystatin (MYCOSTATIN) powder, , Topical, BID, Rexene Alberts, MD .  ondansetron [facility]) injection 4 mg, 4 mg, Intravenous, Q6H PRN, Rexene Alberts, MD, 4 mg at 05/12/16 0415 .  oxyCODONE (Oxy IR/ROXICODONE) immediate release tablet 5-10 mg, 5-10 mg, Oral, Q3H PRN, Rexene Alberts, MD, 5 mg at 05/06/16 1725 .   patient's guide to using coumadin book, , Does not apply, Once, Lyndee Leo, [facility] .  potassium chloride (KLOR-CON) packet 20 mEq, 20 mEq, Oral, BID, Rexene Alberts, MD .  promethazine (PHENERGAN) injection 12.5 mg, 12.5 mg, Intravenous, Q6H PRN, Rexene Alberts, MD, 12.5 mg at 05/11/16 1903 .  sodium chloride flush (NS) 0.9 % injection 10-40 mL, 10-40 mL, Intracatheter, Q12H, Rexene Alberts, MD, 20 mL at 05/11/16 0915 .  sodium chloride flush (NS) 0.9 % injection 10-40 mL, 10-40 mL, Intracatheter, PRN, Rexene Alberts, MD .  sodium chloride flush (NS) 0.9 % injection 3 mL, 3 mL, Intravenous, Q12H, Rexene Alberts, MD, 3 mL at 05/11/16 0916 .  sodium chloride flush (NS) 0.9 % injection 3 mL, 3 mL, Intravenous, PRN, Rexene Alberts, MD .  spironolactone (ALDACTONE) tablet 50 mg, 50 mg, Oral, Daily, Rexene Alberts, MD, 50 mg at 05/12/16 1137 .  traMADol (ULTRAM) tablet 50-100 mg, 50-100 mg, Oral, Q4H PRN, Rexene Alberts, MD, 100 mg at 05/08/16 2306 .  traZODone (DESYREL) tablet 100 mg, 100 mg,  Oral, QHS, Rexene Alberts, MD, 100 mg at 05/10/16 2151 .  warfarin (COUMADIN) tablet 7.5 mg, 7.5 mg, Oral, q1800, Gaye Pollack, MD, 7.5 mg at 05/11/16 1836 .  Warfarin - Physician Dosing Inpatient, , Does not apply, q1800, Rexene Alberts, MD  Patients Current Diet: DIET DYS 3 Room service appropriate?: Yes; Fluid consistency:: Thin  Precautions / Restrictions Precautions Precautions: Fall, Sternal Precaution Comments: L hemiparesis; L neglect; L subluxed shoulder Restrictions Weight Bearing Restrictions: Yes LLE Weight Bearing: Weight bearing as tolerated Other Position/Activity Restrictions: sternal precautions   Has the patient had 2 or more falls or a fall with injury in the past year?No  Prior Activity Level Household: had CVA 3/17 and d/c'd to SNF. 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 Walker and w/c at [facility] since CVA   Prior Functional Level Prior Function Level of Independence: Needs assistance Gait / Transfers Assistance Needed: at SNF ambulating a little in parallel bars, assist for transfers and gets around in w/c ADL's / Homemaking Assistance Needed: Pt requires assist for ADLs  Comments: pt completely independent prior to 3/17  Self Care: Did the patient need help bathing, dressing, using the toilet or eating?  Needed some help was Independent prior to 3/17  Indoor Mobility: Did the patient need assistance with walking from room to room (with or without device)? Needed some help  Stairs: Did the patient need assistance with internal or external stairs (with or without device)? Needed some help  Functional Cognition: Did the patient need help planning regular tasks such as shopping or remembering to take medications? Needed some help  Current Functional Level Cognition  Overall Cognitive Status: Within Functional Limits for tasks assessed Current Attention Level: Sustained (however gets easily distracted) Orientation Level: Oriented X4 Following Commands: Follows one step commands consistently (with occassional A to re-direct due to distraction)    Extremity Assessment (includes Sensation/Coordination)  Upper Extremity Assessment: LUE deficits/detail LUE Deficits / Details: Beginning flexor synergy pattern LUE; subluxed L shoulder. painful shoulder wtih ROM. Limited ROM LUE. able to tolerate @ 080 FF; ER to neutral and 0-45 ;Abd. increased tightness in pectoral region. elbow ROM WFL; Decreased PROM L hand due to tightness in flexors. Has resting hand splint, howver, may need to further assess fit of splint; nonfunctional LUE LUE Sensation: decreased light touch LUE Coordination: decreased fine motor, decreased gross motor   Lower Extremity Assessment: Defer to PT evaluation RLE Deficits / Details: AAROM WFL,. strength at least 3+/5 lots of fluid in legs making them heavy LLE Deficits / Details: unable to dorsiflex ankle, PF strength 2+/5, hip flexion 2-/5    ADLs  Overall ADL's : Needs assistance/impaired Eating/Feeding: Set up, Supervision/ safety, Cueing for safety Eating/Feeding Details (indicate cue type and reason): just resumed water and puree diet. Pt issued plate guard  - educated pt/husband and NT on use Grooming: Wash/dry face, Oral care, Set up, Supervision/safety, Sitting Upper Body Bathing: Minimal assitance, Sitting Lower Body Bathing: Maximal assistance, +2 for physical assistance, Bed level (for rolling) Upper Body Dressing : Maximal assistance Upper Body Dressing Details (indicate cue type and reason): sitting EOB Lower Body Dressing: Moderate assistance, Sit to/from stand Lower Body Dressing Details (indicate cue type and reason): Pt is able to don socks with supervision EOB.  Mod A to move sit to stand  Toilet Transfer: +2 for physical assistance, Maximal assistance, Stand-pivot Toilet Transfer Details (indicate cue type and reason): recliner>to W/C Toileting- Clothing Manipulation and Hygiene: Maximal assistance, Sit to/from stand Functional mobility during ADLs:  (use of sky lift) General ADL Comments: Pt now with sternal precautions    Mobility  Overal bed mobility: Needs Assistance, +2 for physical assistance Bed Mobility: Sit to Supine, Rolling Rolling:  (rolls left with min A, but total to roll right) Sidelying to sit: Max assist, +2 for physical assistance, +2 for safety/equipment Supine to sit: Max assist, +2 for physical assistance, HOB elevated Sit to supine: +2 for physical assistance, Total assist General bed mobility comments: Due to sternal precuaitons, pt requires increased assistance for bed mobility    Transfers  Overall transfer level: Needs assistance Equipment  used: Ambulation equipment used Transfer via Lift Equipment: Stedy Transfers: Sit to/from Stand Sit to Stand: Max assist, Mod assist Stand pivot transfers: Max assist, +2 physical assistance Squat pivot transfers: Mod assist, From elevated surface  Lateral/Scoot Transfers: Total assist, +2 physical assistance, With slide board General transfer comment: Assist to bring hips and trunk up. Verbal cues to not pull heavily on RUE    Ambulation / Gait / Stairs / Wheelchair Mobility  Ambulation/Gait General Gait Details: unable; performed standing weight shifts Lt, Rt    Posture / Balance Dynamic Sitting Balance Sitting balance - Comments: Pt sat edge of chair with supervision. Balance Overall balance assessment: Needs assistance Sitting-balance support: No upper extremity supported Sitting balance-Leahy Scale: Fair Sitting balance - Comments: Pt sat edge of chair with supervision. Standing balance support: Single extremity supported Standing balance-Leahy Scale: Poor Standing balance comment: pt stood with Stedy twice x 1 minute with min to mod A to maintain standing. Lt knee support from shin pad of Stedy. Verbal/tactile cues for facilitation of hip and trunk extension.    Special needs/care consideration BiPAP/CPAP Bipap at HS Oxygen O2 at 2 liters nasal cannula during the day Special Bed overlay mattress Skin surgical incision. Skin reddened and cracking to BLE Rash to abdomen  Bowel mgmt: continent Bladder mgmt: continent Diabetic mgmt yes   Previous Home Environment Living Arrangements: Spouse/significant other  Lives With: Spouse Available Help at Discharge: Family, Available 24 hours/day (spouse) Type of Home: [place] Name: SNF since 3/17 CVA for rehab and IV antibiotics Home Layout: One level Home Access: Stairs to enter Entrance Stairs-Rails: Right Entrance Stairs-Number of Steps: 3 Bathroom Shower/Tub: Chiropodist: Standard Bathroom  Accessibility: Yes How Accessible: Accessible via walker Home Care Services: No Additional Comments: sposue and son working on ramp to be built weekent of 6/24  Discharge Living Setting Plans for Discharge Living Setting: Patient's home, Lives with (comment) (spouse) Type of Home at Discharge: House Discharge Home Layout: One level Discharge Home Access: Stairs to enter Entrance Stairs-Rails: Right Entrance Stairs-Number of Steps: 3 Discharge Bathroom Shower/Tub: Tub/shower unit Discharge Bathroom Toilet: Standard Discharge Bathroom Accessibility: Yes How Accessible: Accessible via walker Does the patient have any problems obtaining your medications?: Yes (Describe) (uninsured)  Social/Family/Support Systems Patient Roles: Spouse, Parent, Other (Comment) (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  Caregiver Availability: 24/7 Discharge Plan Discussed with Primary Caregiver: Yes Is Caregiver In Agreement with Plan?: Yes Does  Caregiver/Family have Issues with Lodging/Transportation while Pt is in Rehab?: No Helene Kelp is her best friend who she calls her sister. Helene Kelp and her husband Vonna Drafts can assist in the care of pt at home. SPouse much smaller man with health issues of his own. He drives and does not use AD. Pt's son can not assist due to his own home responsibilities. Spouse and son building a ramp the weekend of 05/16/16.  Goals/Additional Needs Patient/Family Goal for Rehab: Min PT and OT, supervision with SLP Expected length of stay: [facility] 18-22 days Pt/Family Agrees to Admission and willing to participate: Yes Program Orientation Provided & Reviewed with Pt/Caregiver Including Roles  & Responsibilities: Yes   Decrease  burden of Care through IP rehab admission: n/a  Possible need for SNF placement upon discharge: pt and spouse do not want to return to [facility] or any other SNF. They were there with LOG from [facility]. They wish to d/c home with help of friends Helene Kelp and [place].  Patient Condition: This patient's medical and functional status has changed since the consult dated: 05/06/2016 in which the Rehabilitation Physician determined and documented that the patient's condition is appropriate for intensive rehabilitative care in an inpatient rehabilitation facility. See "History of Present Illness" (above) for medical update. Functional changes are: mod to max assist. Patient's medical and functional status update has been discussed with the Rehabilitation physician and patient remains appropriate for inpatient rehabilitation. Will admit to inpatient rehab today.  Preadmission Screen Completed By:  Cleatrice Burke, 05/12/2016 2:57 PM ______________________________________________________________________   Discussed status with Dr. Naaman Plummer on 05/12/16 at 1454 and received telephone approval for admission today.  Admission Coordinator:  Cleatrice Burke, time [id number] Date 05/12/16

## 2016-05-12 NOTE — Progress Notes (Addendum)
[address]       [place] [address]             [phone]      12 Days Post-Op Procedure(s) (LRB): MITRAL VALVE REPAIR  (N/A) TRANSESOPHAGEAL ECHOCARDIOGRAM (TEE) (N/A) Subjective: Feels less nauseated , having some intermit PAF with RVR  Objective: Vital signs in last 24 hours: Temp:  [97.5 F (36.4 C)-98.2 F (36.8 C)] 98.2 F (36.8 C) (06/20 0411) Pulse Rate:  [55-66] 55 (06/20 0411) Cardiac Rhythm:  [-] Normal sinus rhythm (06/19 2000) Resp:  [20-24] 20 (06/20 0411) BP: (107-138)/(44-77) 121/66 mmHg (06/20 0411) SpO2:  [98 %-100 %] 100 % (06/20 0411) Weight:  [231 lb 8 oz (105.008 kg)] 231 lb 8 oz (105.008 kg) (06/20 0411)  Hemodynamic parameters for last 24 hours:    Intake/Output from previous day: 06/19 0701 - 06/20 0700 In: 375 [P.O.:375] Out: 411 [Urine:161; Emesis/NG output:250] Intake/Output this shift:    General appearance: alert, cooperative, fatigued and no distress Heart: regular rate and rhythm Lungs: min dim in bases Abdomen: soft, nontender Extremities: chronic venous stasis changes Wound: incis healing well  Lab Results:  Recent Labs  05/10/16 0314 05/11/16 0457  WBC 13.0* 13.3*  HGB 10.0* 10.4*  HCT 34.5* 36.1  PLT 611* 680*   BMET:  Recent Labs  05/10/16 0314 05/11/16 0457  NA 134* 134*  K 3.9 4.0  CL 90* 94*  CO2 36* 36*  GLUCOSE 195* 188*  BUN 11 10  CREATININE 0.82 0.85  CALCIUM 10.0 11.0*    PT/INR:  Recent Labs  05/12/16 0420  LABPROT 22.4*  INR 1.99*   ABG    Component Value Date/Time   PHART 7.343* 05/02/2016 0345   HCO3 31.9* 05/02/2016 0345   TCO2 39 05/07/2016 2219   O2SAT 69.6 05/03/2016 0423   CBG (last 3)   Recent Labs  05/11/16 1633 05/11/16 2032 05/12/16 0640  GLUCAP 136* 132* 132*    Meds Scheduled Meds: . amiodarone  400 mg Oral BID  . antiseptic oral rinse  7 mL Mouth Rinse q12n4p  . aspirin EC  81 mg Oral Daily  . atenolol  12.5 mg Oral BID  .  bisacodyl  10 mg Oral Daily   Or  . bisacodyl  10 mg Rectal Daily  . chlorhexidine  15 mL Mouth Rinse BID  . clonazePAM  0.5 mg Oral QHS  . docusate sodium  200 mg Oral Daily  . escitalopram  10 mg Oral QHS  . famotidine  20 mg Oral BID  . furosemide  80 mg Oral Daily  . insulin aspart  0-20 Units Subcutaneous TID WC  . insulin detemir  28 Units Subcutaneous Daily  . nystatin   Topical BID  . potassium chloride  20 mEq Oral BID  . sodium chloride flush  10-40 mL Intracatheter Q12H  . sodium chloride flush  3 mL Intravenous Q12H  . spironolactone  50 mg Oral Daily  . traZODone  100 mg Oral QHS  . warfarin  7.5 mg Oral q1800  . Warfarin - Physician Dosing Inpatient   Does not apply q1800   Continuous Infusions: . sodium chloride 10 mL/hr at 05/11/16 1707   PRN Meds:.clonazePAM, Gerhardt's butt cream, levalbuterol, ondansetron (ZOFRAN) IV, oxyCODONE, promethazine, sodium chloride flush, sodium chloride flush, traMADol  Xrays Dg Chest Port 1 View  05/11/2016  CLINICAL DATA: Atelectasis.  CHF.  Shortness of breath. EXAM: PORTABLE CHEST 1 VIEW COMPARISON:  05/08/2016. FINDINGS: Left PICC line stable position. Prior CABG and cardiac valve replacement. Cardiomegaly with diffuse bilateral pulmonary infiltrates suggesting congestive heart failure. Bilateral pneumonia cannot be excluded. Interim improvement of bilateral pleural effusions. No pneumothorax . IMPRESSION: 1. Stable position of left PICC line. 2. Prior CABG and cardiac valve replacement. Persistent cardiomegaly and bilateral pulmonary infiltrates and or edema noted. Small bilateral pleural effusions, improved prior exam . Electronically Signed   By: Marcello Moores  Register   On: 05/11/2016 07:20    Assessment/Plan: S/P Procedure(s) (LRB): MITRAL VALVE REPAIR  (N/A) TRANSESOPHAGEAL ECHOCARDIOGRAM (TEE) (N/A)  1 PAF with RVR, cont amio, coumadin, wwill increase beta blocker dose 2 no new labs 3 nausea/vomiting  Improved  , giving meds  with food and not all at once. Hopefully she will tolerate  4 sugars well controlled 5 CIR soon  LOS: 28 days    GOLD,WAYNE E 05/12/2016  I have seen and examined the patient and agree with the assessment and plan as outlined.  Nausea and vomiting likely related to oral medications.  Seems much better today.  D/C lovenox now that INR 1.99   Rexene Alberts, MD 05/12/2016 10:39 AM

## 2016-05-12 NOTE — Care Management Note (Signed)
Case Management Note Previous CM note initiated by Donne Anon RN, CM  Patient Details  Name: Margaret [name] MRN: [id number] Date of Birth: Apr 15, 1956  Subjective/Objective:        Adm w heart failure            Action/Plan: has husband, in [facility] for rehab tx from ICU to 2W on 05/11/16  Expected Discharge Date:   05/12/16               Expected Discharge Plan:  [place]  In-House Referral:  Clinical Social Work  Discharge planning Services  CM Consult  Post Acute Care Choice:    Choice offered to:     DME Arranged:    DME Agency:     HH Arranged:    [place] Agency:     Status of Service:  Completed, signed off  Medicare Important Message Given:    Date Medicare IM Given:    Medicare IM give by:    Date Additional Medicare IM Given:    Additional Medicare Important Message give by:     If discussed at [place] of Stay Meetings, dates discussed:    Additional Comments:sw ref made, ur review done  05/12/16- 1400- Versia Mignogna RN, CM- pt s/p MVR- was at [facility] prior to admission- per Pamala Hurry with CIR - pt has bed available today and they making bed offer for  today- MD agreeable- plan to d/c pt to CIR later today-- CSW notified of plan to d/c to [place], Romeo Rabon, RN 05/12/2016, 3:53 PM [phone]

## 2016-05-12 NOTE — Progress Notes (Signed)
ANTICOAGULATION CONSULT NOTE - Initial Consult  Pharmacy Consult for coumadin  Indication: s/p mitral valve repair/afib  Allergies  Allergen Reactions  . Codeine Hives, Itching and Other (See Comments)    "breathing problems"    Patient Measurements:    Vital Signs: Temp: 98.3 F (36.8 C) (06/20 1843) Temp Source: Oral (06/20 1843) BP: 114/69 mmHg (06/20 1843) Pulse Rate: 55 (06/20 1843)  Labs:  Recent Labs  05/10/16 0314 05/11/16 0457 05/12/16 0420  HGB 10.0* 10.4*  --   HCT 34.5* 36.1  --   PLT 611* 680*  --   LABPROT 20.4* 18.7* 22.4*  INR 1.75* 1.56* 1.99*  CREATININE 0.82 0.85  --     Estimated Creatinine Clearance: 84.7 mL/min (by C-G formula based on Cr of 0.85).   Medical History: Past Medical History  Diagnosis Date  . Hypertension   . Hyperlipidemia   . COPD (chronic obstructive pulmonary disease) ([place]) 04/20/12  . Asthma   . Type II diabetes mellitus ([place])   . Anemia   . H/O hiatal hernia   . Osteoarthritis   . Pneumonia 04/2012  . Migraines   . Panic attacks 04/20/12  . Morbid obesity ([place])   . Heart murmur   . Bacterial endocarditis     Strep viridans   . Chronic diastolic CHF (congestive heart failure) ([place]) 04/27/2012  . Pulmonary hypertension assoc with unclear multi-factorial mechanisms ([place]) 01/23/2016  . Positive ANA (antinuclear antibody)   . 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 Stroke ([place])   . Subarachnoid bleed ([place]) 02/01/2016  . S/P mitral valve repair 04/30/2016    Complex valvuloplasty including autologous pericardial patch repair of perforated posterior leaflet, artificial Gore-tex neochord placement x6 and 26 mm Sorin Memo 3D ring annuloplasty    Assessment: 17 YOF s/p mitral valve repair on 6/8, had afeb, and has been on coumadin since 6/11 dosed by CVTS, now transferred to [facility], pharmacy is consulted for coumadin dosing. Per CVTS discharge summary on 6/16, She will need an INR of 2.5-3.5. INR 1.99 this mroning, pt has already received  7.5 mg prior to transfer.    Goal of Therapy:  INR of 2.5-3.5 Monitor platelets by anticoagulation protocol: Yes   Plan:  - Daily PT/INR - Pt already received 7.5 mg this evening.   Maryanna Shape, PharmD, BCPS  Clinical Pharmacist  Pager: [phone]   05/12/2016,6:50 PM

## 2016-05-12 NOTE — Progress Notes (Signed)
RT NOTE:  Pt is refusing BIPAP therapy tonight. She sates she has never used this at home. Pt did use BIPAP for a short time in ICU. There is no documented history of OSA. Dr Naaman Plummer did not make note of BIPAP being needed, he noted patient to use Oxygen via [place] at 2L. The BIPAP QHS order was enter by Lauraine Rinne, PA without note. Pt stated she will not wear without a reason. Pt is currently wearing 2L [place] and tolerating well. RT will monitor.

## 2016-05-12 NOTE — Progress Notes (Signed)
Margaret Gong, RN Rehab Admission Coordinator Signed Physical Medicine and Rehabilitation PMR Pre-admission 05/12/2016 2:43 PM  Related encounter: Admission (Current) from 04/14/2016 in [place] [facility]   PMR Admission Coordinator Pre-Admission Assessment  Patient: Margaret [name] is an 60 y.o., female MRN: [id number] DOB: 18-Apr-1956 Height: 5\' 5"  (165.1 cm) Weight: 105.008 kg (231 lb 8 oz)  Insurance Information  PRIMARY: uninsured   Medicaid Application Date: Case Manager:  Disability Application Date: Case Worker:  Spouse has been working with SW at [employer] for [employer] and disability applications but needs to provide bank statements which have been unavailable  Emergency Contact Information Contact Information    Name Relation Home Work Mobile   Carlo,William Spouse   [phone]     Current Medical History  Patient Admitting Diagnosis: debility after mitral valve repair, h/o r CVA  History of Present Illness: Margaret [name] is a 60 y.o. 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 Presented 04/23/2016 plan mitral valve surgery and underwent mitral valve repair 04/30/2016 per Dr. Roxy Manns. Sternal precautions as directed. Hospital course pain management. Patient was short run of PAF with RVR placed on amiodarone with follow-up for cardiology per services.  Dysphagia maintain on dysphagia #3 thin liquid diet. TPN initiated for nutritional support and discontinued as nutrition improved. Placed on Coumadin therapy after mitral valve repair. Acute on chronic blood loss anemia hemoglobin 10.4 . patient with fluid overload maintained on Lasix infusion and close monitoring of renal function. Physical and occupational therapy ongoing family requesting to have patient discharged to home instead of back to skilled nursing facility.  Past Medical History  Past Medical History  Diagnosis Date  . Hypertension   . Hyperlipidemia   . COPD (chronic obstructive pulmonary disease) ([place]) 04/20/12  . Asthma   . Type II diabetes mellitus ([place])   . Anemia   . H/O hiatal hernia   . Osteoarthritis   . Pneumonia 04/2012  . Migraines   . Panic attacks 04/20/12  . Morbid obesity ([place])   . Heart murmur   . Bacterial endocarditis     Strep viridans   . Chronic diastolic CHF (congestive heart failure) ([place]) 04/27/2012  . Pulmonary hypertension assoc with unclear multi-factorial mechanisms ([place]) 01/23/2016  . Positive ANA (antinuclear antibody)   . 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 Stroke ([place])   . Subarachnoid bleed ([place]) 02/01/2016  . S/P mitral valve repair 04/30/2016    Complex valvuloplasty including autologous pericardial patch repair of perforated posterior leaflet, artificial Gore-tex neochord placement x6 and 26 mm Sorin Memo 3D ring annuloplasty    Family History  family history includes COPD in her father; Clotting disorder in her maternal grandmother; Coronary artery disease (age of onset: 61) in her mother; Stroke in her father.  Prior Rehab/Hospitalizations:  Has the patient had major surgery during 100 days prior to admission? No Prior to CVA 3/17 pt was independent, driving and working. Has been at [facility] 3/17 until admit 6/1 to receive rehab and IV antibiotics.  Current  Medications   Current facility-administered medications:  . 0.9 % sodium chloride infusion, , Intravenous, Continuous, Rexene Alberts, MD, Last  Rate: 10 mL/hr at 05/11/16 1707 . amiodarone (PACERONE) tablet 400 mg, 400 mg, Oral, BID, Gaye Pollack, MD, 400 mg at 05/12/16 1137 . antiseptic oral rinse (CPC / CETYLPYRIDINIUM CHLORIDE 0.05%) solution 7 mL, 7 mL, Mouth Rinse, q12n4p, Rexene Alberts, MD, 7 mL at 05/10/16 1600 . aspirin EC tablet 81 mg, 81 mg, Oral, Daily, 81 mg at 05/12/16 1137 **OR** [DISCONTINUED] aspirin chewable tablet 324 mg, 324 mg, Per Tube, Daily, Rexene Alberts, MD, 324 mg at 05/01/16 1012 . atenolol (TENORMIN) tablet 12.5 mg, 12.5 mg, Oral, BID, Rexene Alberts, MD, 12.5 mg at 05/12/16 1153 . bisacodyl (DULCOLAX) EC tablet 10 mg, 10 mg, Oral, Daily, 10 mg at 05/12/16 1201 **OR** bisacodyl (DULCOLAX) suppository 10 mg, 10 mg, Rectal, Daily, Rexene Alberts, MD, 10 mg at 05/02/16 0907 . chlorhexidine (PERIDEX) 0.12 % solution 15 mL, 15 mL, Mouth Rinse, BID, Rexene Alberts, MD, 15 mL at 05/11/16 2157 . clonazePAM (KLONOPIN) tablet 0.5 mg, 0.5 mg, Oral, QHS, Rexene Alberts, MD, 0.5 mg at 05/11/16 2158 . clonazePAM (KLONOPIN) tablet 0.5 mg, 0.5 mg, Oral, TID PRN, Rexene Alberts, MD, 0.5 mg at 05/12/16 0124 . docusate sodium (COLACE) capsule 200 mg, 200 mg, Oral, Daily, Rexene Alberts, MD, 200 mg at 05/12/16 1201 . escitalopram (LEXAPRO) tablet 10 mg, 10 mg, Oral, QHS, Rexene Alberts, MD, 10 mg at 05/11/16 2158 . famotidine (PEPCID) tablet 20 mg, 20 mg, Oral, BID, Rebecka Apley, RPH, 20 mg at 05/12/16 1208 . furosemide (LASIX) tablet 80 mg, 80 mg, Oral, Daily, Rexene Alberts, MD, 80 mg at 05/12/16 1137 . Gerhardt's butt cream, , Topical, PRN, Rexene Alberts, MD . insulin aspart (novoLOG) injection 0-20 Units, 0-20 Units, Subcutaneous, TID WC, Rexene Alberts, MD, 4 Units at 05/12/16 1207 . insulin detemir (LEVEMIR) injection 28 Units, 28 Units,  Subcutaneous, Daily, Rexene Alberts, MD, 28 Units at 05/12/16 1135 . levalbuterol (XOPENEX) nebulizer solution 1.25 mg, 1.25 mg, Nebulization, Q6H PRN, Rexene Alberts, MD . nystatin (MYCOSTATIN) powder, , Topical, BID, Rexene Alberts, MD . ondansetron [facility]) injection 4 mg, 4 mg, Intravenous, Q6H PRN, Rexene Alberts, MD, 4 mg at 05/12/16 0415 . oxyCODONE (Oxy IR/ROXICODONE) immediate release tablet 5-10 mg, 5-10 mg, Oral, Q3H PRN, Rexene Alberts, MD, 5 mg at 05/06/16 1725 . patient's guide to using coumadin book, , Does not apply, Once, Lyndee Leo, [facility] . potassium chloride (KLOR-CON) packet 20 mEq, 20 mEq, Oral, BID, Rexene Alberts, MD . promethazine (PHENERGAN) injection 12.5 mg, 12.5 mg, Intravenous, Q6H PRN, Rexene Alberts, MD, 12.5 mg at 05/11/16 1903 . sodium chloride flush (NS) 0.9 % injection 10-40 mL, 10-40 mL, Intracatheter, Q12H, Rexene Alberts, MD, 20 mL at 05/11/16 0915 . sodium chloride flush (NS) 0.9 % injection 10-40 mL, 10-40 mL, Intracatheter, PRN, Rexene Alberts, MD . sodium chloride flush (NS) 0.9 % injection 3 mL, 3 mL, Intravenous, Q12H, Rexene Alberts, MD, 3 mL at 05/11/16 0916 . sodium chloride flush (NS) 0.9 % injection 3 mL, 3 mL, Intravenous, PRN, Rexene Alberts, MD . spironolactone (ALDACTONE) tablet 50 mg, 50 mg, Oral, Daily, Rexene Alberts, MD, 50 mg at 05/12/16 1137 . traMADol (ULTRAM) tablet 50-100 mg, 50-100 mg, Oral, Q4H PRN, Rexene Alberts, MD, 100 mg at 05/08/16 2306 . traZODone (DESYREL) tablet 100 mg, 100 mg, Oral, QHS, Rexene Alberts, MD, 100 mg at 05/10/16 2151 . warfarin (COUMADIN) tablet 7.5 mg, 7.5  mg, Oral, q1800, Gaye Pollack, MD, 7.5 mg at 05/11/16 1836 . Warfarin - Physician Dosing Inpatient, , Does not apply, q1800, Rexene Alberts, MD  Patients Current Diet: DIET DYS 3 Room service appropriate?: Yes; Fluid consistency:: Thin  Precautions / Restrictions Precautions Precautions: Fall, Sternal Precaution  Comments: L hemiparesis; L neglect; L subluxed shoulder Restrictions Weight Bearing Restrictions: Yes LLE Weight Bearing: Weight bearing as tolerated Other Position/Activity Restrictions: sternal precautions   Has the patient had 2 or more falls or a fall with injury in the past year?No  Prior Activity Level Household: had CVA 3/17 and d/c'd to SNF. 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 Walker and w/c at [facility] since CVA   Prior Functional Level Prior Function Level of [place]: Needs assistance Gait / Transfers Assistance Needed: at SNF ambulating a little in parallel bars, assist for transfers and gets around in w/c ADL's / Homemaking Assistance Needed: Pt requires assist for ADLs  Comments: pt completely independent prior to 3/17  Self Care: Did the patient need help bathing, dressing, using the toilet or eating? Needed some help was Independent prior to 3/17  Indoor Mobility: Did the patient need assistance with walking from room to room (with or without device)? Needed some help  Stairs: Did the patient need assistance with internal or external stairs (with or without device)? Needed some help  Functional Cognition: Did the patient need help planning regular tasks such as shopping or remembering to take medications? Needed some help  Current Functional Level Cognition  Overall Cognitive Status: Within Functional Limits for tasks assessed Current Attention Level: Sustained (however gets easily distracted) Orientation Level: Oriented X4 Following Commands: Follows one step commands consistently (with occassional A to re-direct due to distraction)   Extremity Assessment (includes Sensation/Coordination)  Upper Extremity Assessment: LUE  deficits/detail LUE Deficits / Details: Beginning flexor synergy pattern LUE; subluxed L shoulder. painful shoulder wtih ROM. Limited ROM LUE. able to tolerate @ 080 FF; ER to neutral and 0-45 ;Abd. increased tightness in pectoral region. elbow ROM WFL; Decreased PROM L hand due to tightness in flexors. Has resting hand splint, howver, may need to further assess fit of splint; nonfunctional LUE LUE Sensation: decreased light touch LUE Coordination: decreased fine motor, decreased gross motor  Lower Extremity Assessment: Defer to PT evaluation RLE Deficits / Details: AAROM WFL,. strength at least 3+/5 lots of fluid in legs making them heavy LLE Deficits / Details: unable to dorsiflex ankle, PF strength 2+/5, hip flexion 2-/5    ADLs  Overall ADL's : Needs assistance/impaired Eating/Feeding: Set up, Supervision/ safety, Cueing for safety Eating/Feeding Details (indicate cue type and reason): just resumed water and puree diet. Pt issued plate guard - educated pt/husband and NT on use Grooming: Wash/dry face, Oral care, Set up, Supervision/safety, Sitting Upper Body Bathing: Minimal assitance, Sitting Lower Body Bathing: Maximal assistance, +2 for physical assistance, Bed level (for rolling) Upper Body Dressing : Maximal assistance Upper Body Dressing Details (indicate cue type and reason): sitting EOB Lower Body Dressing: Moderate assistance, Sit to/from stand Lower Body Dressing Details (indicate cue type and reason): Pt is able to don socks with supervision EOB. Mod A to move sit to stand  Toilet Transfer: +2 for physical assistance, Maximal assistance, Stand-pivot Toilet Transfer Details (indicate cue type and reason): recliner>to  W/C Toileting- Clothing Manipulation and Hygiene: Maximal assistance, Sit to/from stand Functional mobility during ADLs: (use of sky lift) General ADL Comments: Pt now with sternal precautions    Mobility  Overal bed mobility: Needs Assistance, +2 for  physical assistance Bed Mobility: Sit to Supine, Rolling Rolling: (rolls left with min A, but total to roll right) Sidelying to sit: Max assist, +2 for physical assistance, +2 for safety/equipment Supine to sit: Max assist, +2 for physical assistance, HOB elevated Sit to supine: +2 for physical assistance, Total assist General bed mobility comments: Due to sternal precuaitons, pt requires increased assistance for bed mobility    Transfers  Overall transfer level: Needs assistance Equipment used: Ambulation equipment used Transfer via Lift Equipment: Stedy Transfers: Sit to/from Stand Sit to Stand: Max assist, Mod assist Stand pivot transfers: Max assist, +2 physical assistance Squat pivot transfers: Mod assist, From elevated surface Lateral/Scoot Transfers: Total assist, +2 physical assistance, With slide board General transfer comment: Assist to bring hips and trunk up. Verbal cues to not pull heavily on RUE    Ambulation / Gait / Stairs / Wheelchair Mobility  Ambulation/Gait General Gait Details: unable; performed standing weight shifts Lt, Rt    Posture / Balance Dynamic Sitting Balance Sitting balance - Comments: Pt sat edge of chair with supervision. Balance Overall balance assessment: Needs assistance Sitting-balance support: No upper extremity supported Sitting balance-Leahy Scale: Fair Sitting balance - Comments: Pt sat edge of chair with supervision. Standing balance support: Single extremity supported Standing balance-Leahy Scale: Poor Standing balance comment: pt stood with Stedy twice x 1 minute with min to mod A to maintain standing. Lt knee support from shin pad of Stedy. Verbal/tactile cues for facilitation of hip and trunk extension.    Special needs/care consideration BiPAP/CPAP Bipap at HS Oxygen O2 at 2 liters nasal cannula during the day Special Bed overlay mattress Skin surgical incision. Skin reddened and cracking to BLE Rash to abdomen   Bowel mgmt: continent Bladder mgmt: continent Diabetic mgmt yes   Previous Home Environment Living Arrangements: Spouse/significant other Lives With: Spouse Available Help at Discharge: Family, Available 24 hours/day (spouse) Type of Home: [place] Name: SNF since 3/17 CVA for rehab and IV antibiotics Home Layout: One level Home Access: Stairs to enter Entrance Stairs-Rails: Right Entrance Stairs-Number of Steps: 3 Bathroom Shower/Tub: Chiropodist: Standard Bathroom Accessibility: Yes How Accessible: Accessible via walker Home Care Services: No Additional Comments: sposue and son working on ramp to be built weekent of 6/24  Discharge Living Setting Plans for Discharge Living Setting: Patient's home, Lives with (comment) (spouse) Type of Home at Discharge: House Discharge Home Layout: One level Discharge Home Access: Stairs to enter Entrance Stairs-Rails: Right Entrance Stairs-Number of Steps: 3 Discharge Bathroom Shower/Tub: Tub/shower unit Discharge Bathroom Toilet: Standard Discharge Bathroom Accessibility: Yes How Accessible: Accessible via walker Does the patient have any problems obtaining your medications?: Yes (Describe) (uninsured)  Social/Family/Support Systems Patient Roles: Spouse, Parent, Other (Comment) (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  Caregiver Availability: 24/7 Discharge Plan Discussed with Primary Caregiver: Yes Is Caregiver In Agreement with Plan?: Yes Does Caregiver/Family have Issues with Lodging/Transportation while Pt is in Rehab?: No Helene Kelp is her best friend who she calls her sister. Helene Kelp and her husband Vonna Drafts can assist in the  care of pt at  home. SPouse much smaller man with health issues of his own. He drives and does not use AD. Pt's son can not assist due to his own home responsibilities. Spouse and son building a ramp the weekend of 05/16/16.  Goals/Additional Needs Patient/Family Goal for Rehab: Min PT and OT, supervision with SLP Expected length of stay: ELOS 18-22 days Pt/Family Agrees to Admission and willing to participate: Yes Program Orientation Provided & Reviewed with Pt/Caregiver Including Roles & Responsibilities: Yes   Decrease burden of Care through IP rehab admission: n/a  Possible need for SNF placement upon discharge: pt and spouse do not want to return to [facility] or any other SNF. They were there with LOG from [facility]. They wish to d/c home with help of friends Helene Kelp and [place].  Patient Condition: This patient's medical and functional status has changed since the consult dated: 05/06/2016 in which the Rehabilitation Physician determined and documented that the patient's condition is appropriate for intensive rehabilitative care in an inpatient rehabilitation facility. See "History of Present Illness" (above) for medical update. Functional changes are: mod to max assist. Patient's medical and functional status update has been discussed with the Rehabilitation physician and patient remains appropriate for inpatient rehabilitation. Will admit to inpatient rehab today.  Preadmission Screen Completed By: Cleatrice Burke, 05/12/2016 2:57 PM ______________________________________________________________________  Discussed status with Dr. Naaman Plummer on 05/12/16 at 1454 and received telephone approval for admission today.  Admission Coordinator: Cleatrice Burke, time [id number] Date 05/12/16          Cosigned by: Meredith Staggers, MD at 05/12/2016 3:32 PM  Revision History     Date/Time User Provider Type Action   05/12/2016 3:32 PM Meredith Staggers, MD Physician Cosign   05/12/2016 2:57 PM Margaret Gong, RN Rehab Admission Coordinator Sign

## 2016-05-12 NOTE — Progress Notes (Signed)
Pt has been transported to 4W from 2W. Pt was transported with all of her belongings. Report was given to receiving nurse and all questions were answered. Pt was in no distress at time of transport.   Grant Fontana BSN, RN

## 2016-05-12 NOTE — Interval H&P Note (Signed)
Margaret [name] was admitted today to Inpatient Rehabilitation with the diagnosis of debility/CVA.  [name] history has been reviewed, patient examined, and there is no change in status.  Patient continues to be appropriate for intensive inpatient rehabilitation.  I have reviewed the patient's chart and labs.  Questions were answered to the patient's satisfaction. The PAPE has been reviewed and assessment remains appropriate.  Rani Idler T 05/12/2016, 7:13 PM

## 2016-05-12 NOTE — Progress Notes (Signed)
PT Cancellation Note  Patient Details Name: Margaret [name] MRN: [id number] DOB: 08-29-1956   Cancelled Treatment:    Reason Eval/Treat Not Completed: Fatigue/lethargy limiting ability to participate.  Will try later as time and pt can allow.   Ramond Dial 05/12/2016, 11:52 AM    Mee Hives, PT MS Acute Rehab Dept. Number: [place] and [place]

## 2016-05-12 NOTE — Progress Notes (Signed)
I met with pt at bedside and discussed with rehab PA. Pt in agreement to admit to inpt rehab today. I contacted Dr. Roxy Manns in the [place] and he is in agreement to d/c pt to inpt rehab today. I will make the arrangements. RN CM is aware. [phone]

## 2016-05-12 NOTE — H&P (View-Only) (Signed)
  Physical Medicine and Rehabilitation Admission H&P    Chief complaint: Weakness   HPI: Margaret [name] is a 60 y.o. 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 At the nursing facility patient remained essentially bed bound. Prior nursing home placement patient live with her husband. She has assistance from her husband's sister's son and family. One level home. Presented 04/23/2016 plan mitral valve surgery and underwent mitral valve repair 04/30/2016 per Dr. Owen. Sternal precautions as directed. Hospital course pain management. Patient was short run of PAF with RVR placed on amiodarone with follow-up for cardiology per services. Dysphagia maintain on dysphagia #3 thin liquid diet. TPN initiated for nutritional support and discontinued as nutrition improved. Placed on Coumadin therapy after mitral valve repair. Acute on chronic blood loss anemia hemoglobin 10.4 . patient with fluid overload maintained on Lasix infusion and close monitoring of renal function. Physical and occupational therapy ongoing family requesting to have patient discharged to home instead of back to skilled nursing facility. Recommendations for physical medicine rehabilitation consult. Patient was admitted for copper in the rehabilitation program  ROS Constitutional: Negative for fever and chills.  HENT: Negative for hearing loss.  Eyes: Negative for blurred vision and double vision.  Respiratory: Positive for cough and shortness of breath.  Cardiovascular: Positive for palpitations and leg swelling. Negative for chest pain.    Gastrointestinal: Positive for constipation. Negative for nausea and vomiting.  Genitourinary: Negative for dysuria and hematuria.  Musculoskeletal: Positive for myalgias, back pain and joint pain.  Skin: Negative for rash.  Neurological: Positive for speech change, focal weakness and weakness. Negative for seizures and headaches.  Psychiatric/Behavioral: Positive for depression.   Panic attacks  All other systems reviewed and are negative   Past Medical History  Diagnosis Date  . Hypertension   . Hyperlipidemia   . COPD (chronic obstructive pulmonary disease) (HCC) 04/20/12  . Asthma   . Type II diabetes mellitus (HCC)   . Anemia   . H/O hiatal hernia   . Osteoarthritis   . Pneumonia 04/2012  . Migraines   . Panic attacks 04/20/12  . Morbid obesity (HCC)   . Heart murmur   . Bacterial endocarditis     Strep viridans   . Chronic diastolic CHF (congestive heart failure) (HCC) 04/27/2012  . Pulmonary hypertension assoc with unclear multi-factorial mechanisms (HCC) 01/23/2016  . Positive ANA (antinuclear antibody)   . 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 Stroke (HCC)   . Subarachnoid bleed (HCC) 02/01/2016  . S/P mitral valve repair 04/30/2016    Complex valvuloplasty including autologous pericardial patch repair of perforated posterior leaflet, artificial Gore-tex neochord placement x6 and 26 mm Sorin Memo 3D ring annuloplasty   Past Surgical History  Procedure Laterality Date  . Laceration repair  1966    "right upper arm"  . Dilation and curettage of uterus  1990's  . Finger reattachment  1973    "right ring finger"  . Tee without cardioversion N/A 02/05/2016    Procedure: TRANSESOPHAGEAL ECHOCARDIOGRAM (TEE);  Surgeon: Dalton S McLean, MD;  Location: [facility] END[facility]COPY;  Service: Cardiovascular;  Laterality: N/A;  . Radiology with anesthesia N/A 02/06/2016    Procedure: RADIOLOGY WITH ANESTHESIA;  Surgeon: Sanjeev Deveshwar,   MD;  Location: [facility] OR;  Service: Radiology;  Laterality: N/A;   . Cardiac catheterization N/A 04/23/2016    Procedure: Right/Left Heart Cath and Coronary Angiography;  Surgeon: Michael Cooper, MD;  Location: [facility] INVASIVE CV LAB;  Service: Cardiovascular;  Laterality: N/A;  . Mitral valve repair N/A 04/30/2016    Procedure: MITRAL VALVE REPAIR ;  Surgeon: Clarence H Owen, MD;  Location: [facility] OR;  Service: Open Heart Surgery;  Laterality: N/A;  . Tee without cardioversion N/A 04/30/2016    Procedure: TRANSESOPHAGEAL ECHOCARDIOGRAM (TEE);  Surgeon: Clarence H Owen, MD;  Location: [facility] OR;  Service: Open Heart Surgery;  Laterality: N/A;   Family History  Problem Relation Age of Onset  . Allergies    . Hypertension    . Coronary artery disease Mother 39  . Stroke Father   . COPD Father   . Clotting disorder Maternal Grandmother     died from blood clot   Social History:  reports that she quit smoking about 4 years ago. Her smoking use included Cigarettes. She has a 8 pack-year smoking history. She has never used smokeless tobacco. She reports that she does not drink alcohol or use illicit drugs. Allergies:  Allergies  Allergen Reactions  . Codeine Hives, Itching and Other (See Comments)    "breathing problems"   Medications Prior to Admission  Medication Sig Dispense Refill  . Alogliptin-Metformin HCl 12.03-999 MG TABS Take 1 tablet by mouth 2 (two) times daily.    . aspirin 81 MG tablet Take 81 mg by mouth daily.    . atenolol (TENORMIN) 25 MG tablet Take 12.5 mg by mouth 2 (two) times daily.     . [EXPIRED] cefTRIAXone (ROCEPHIN) 2 g SOLR injection Inject 2 g into the vein daily. 10 each 0  . cholecalciferol (VITAMIN D) 1000 units tablet Take 1,000 Units by mouth daily.     . clonazePAM (KLONOPIN) 0.5 MG tablet Take 0.5 mg by mouth at bedtime.    . clonazePAM (KLONOPIN) 0.5 MG tablet Take 0.25 mg by mouth 3 (three) times daily as needed for anxiety.    . furosemide (LASIX) 40 MG tablet Take 40 mg by mouth 2 (two) times daily.    . insulin NPH-regular Human  (NOVOLIN 70/30) (70-30) 100 UNIT/ML injection Inject 6-21 Units into the skin 2 (two) times daily with a meal. Patient states she takes 70/30 6 units plus 1 additional unit for every 10 mg/dl over glucose of 150 mg/dl (with max being 300 mg/dl) BID    . lovastatin (MEVACOR) 10 MG tablet Take 10 mg by mouth at bedtime.    . OXYGEN Inhale 2 L into the lungs continuous.    . polyethylene glycol (MIRALAX / GLYCOLAX) packet Take 17 g by mouth daily.    . senna-docusate (SENOKOT-S) 8.6-50 MG tablet Take 1 tablet by mouth 2 (two) times daily.    . sertraline (ZOLOFT) 25 MG tablet Take 25 mg by mouth daily.    . spironolactone (ALDACTONE) 25 MG tablet Take 50 mg by mouth daily. Reported on 04/09/2016    . traMADol (ULTRAM) 50 MG tablet Take 50 mg by mouth every 6 (six) hours as needed for moderate pain.    . traZODone (DESYREL) 100 MG tablet Take 100 mg by mouth at bedtime.    . albuterol (PROVENTIL HFA;VENTOLIN HFA) 108 (90 BASE) MCG/ACT inhaler Inhale 2 puffs into the lungs every 4 (four) hours as needed. For shortness of breath.    . albuterol (PROVENTIL) (2.5   MG/3ML) 0.083% nebulizer solution Take 2.5 mg by nebulization at bedtime.     . ALPRAZolam (XANAX) 0.5 MG tablet Take 0.5 mg by mouth 3 (three) times daily as needed for anxiety. Reported on 04/09/2016    . nitroGLYCERIN (NITROSTAT) 0.4 MG SL tablet Place 1 tablet (0.4 mg total) under the tongue every 5 (five) minutes x 3 doses as needed for chest pain. 25 tablet 3    Home: Home Living Family/patient expects to be discharged to:: Skilled nursing facility Additional Comments: spouse states hoped to be able to take pt home, but did not get ramp completed.  States SW was supposed to be working on it.    Functional History: Prior Function Level of Independence: Needs assistance Gait / Transfers Assistance Needed: at SNF ambulating a little in parallel bars, assist for transfers and gets around in w/c ADL's / Homemaking Assistance Needed: Pt  requires assist for ADLs   Functional Status:  Mobility: Bed Mobility Overal bed mobility: Needs Assistance, +2 for physical assistance Bed Mobility: Sit to Supine, Rolling Rolling:  (rolls left with min A, but total to roll right) Sidelying to sit: Max assist, +2 for physical assistance, +2 for safety/equipment Supine to sit: Max assist, +2 for physical assistance, HOB elevated Sit to supine: +2 for physical assistance, Total assist General bed mobility comments: Due to sternal precuaitons, pt requires increased assistance for bed mobility Transfers Overall transfer level: Needs assistance Equipment used: 2 person hand held assist Transfer via Lift Equipment: [employer] Transfers: Sit to/from Stand, Risk manager, Lateral/Scoot Transfers Sit to Stand: Mod assist, +2 physical assistance Stand pivot transfers: Max assist, +2 physical assistance Squat pivot transfers: Mod assist, From elevated surface  Lateral/Scoot Transfers: Total assist, +2 physical assistance, With slide board General transfer comment: with use of belt and pad under her for stand pivot.  Also tried sliding board with pt total assist with transfer to pts' left.  Ambulation/Gait General Gait Details: unable; performed standing weight shifts Lt, Rt    ADL: ADL Overall ADL's : Needs assistance/impaired Eating/Feeding: Set up, Supervision/ safety, Cueing for safety Eating/Feeding Details (indicate cue type and reason): just resumed water and puree diet. Pt issued plate guard  - educated pt/husband and NT on use Grooming: Wash/dry face, Oral care, Set up, Supervision/safety, Sitting Upper Body Bathing: Minimal assitance, Sitting Lower Body Bathing: Maximal assistance, +2 for physical assistance, Bed level (for rolling) Upper Body Dressing : Maximal assistance Upper Body Dressing Details (indicate cue type and reason): sitting EOB Lower Body Dressing: Moderate assistance, Sit to/from stand Lower Body Dressing  Details (indicate cue type and reason): Pt is able to don socks with supervision EOB.   Mod A to move sit to stand  Toilet Transfer: +2 for physical assistance, Maximal assistance, Stand-pivot Toilet Transfer Details (indicate cue type and reason): recliner>to W/C Toileting- Clothing Manipulation and Hygiene: Maximal assistance, Sit to/from stand Functional mobility during ADLs:  (use of sky lift) General ADL Comments: Pt now with sternal precautions  Cognition: Cognition Overall Cognitive Status: Impaired/Different from baseline Orientation Level: Oriented X4 Cognition Arousal/Alertness: Awake/alert Behavior During Therapy: Impulsive Overall Cognitive Status: Impaired/Different from baseline Area of Impairment: Attention, Following commands, Problem solving Current Attention Level: Sustained (however gets easily distracted) Following Commands: Follows one step commands consistently (with occassional A to re-direct due to distraction) Problem Solving: Slow processing, Difficulty sequencing, Requires verbal cues, Requires tactile cues  Physical Exam: Blood pressure 119/74, pulse 63, temperature 98.4 F (36.9 C), temperature source Oral, resp. rate  21, height 5' 5" (1.651 m), weight 104 kg (229 lb 4.5 oz), SpO2 97 %. Physical Exam Constitutional: She appears well-developed.  Obese  HENT:  Head: Normocephalic and atraumatic.  Eyes: EOM are normal. Right eye exhibits no discharge. Left eye exhibits no discharge.  Neck: Normal range of motion. Neck supple. No thyromegaly present.  Cardiovascular: Regular rhythm.  Bradycardia  Respiratory: No respiratory distress.  Decreased breath sounds at the bases with limited inspiratory effort. Oxygen via [place] at 2L GI: Soft. Bowel sounds are normal.  Musculoskeletal: She exhibits edema. She exhibits no tenderness.  Neurological: She is alert. A cranial nerve deficit is present.  Flat affect.  Severe dysarthria but intelligible.  He is able to  provide her name and age in place.  Follows simple commands. Has reasonable insight and awareness. Memory fair. Delayed problem solving DTRs symmetric RUE/RLE: 4+/5 proximal to distal LUE 0/5. LLE: trace HE. 0/5 KE and ADF/PF Skin: Skin is warm and dry.  Chest incision clean dry and dressed  Psychiatric: Her affect is blunt. Her speech is delayed. She is slowed   Results for orders placed or performed during the hospital encounter of 04/14/16 (from the past 48 hour(s))  Glucose, capillary     Status: Abnormal   Collection Time: 05/09/16 11:44 AM  Result Value Ref Range   Glucose-Capillary 146 (H) 65 - 99 mg/dL   Comment 1 Capillary Specimen    Comment 2 Notify RN   Glucose, capillary     Status: Abnormal   Collection Time: 05/09/16  4:45 PM  Result Value Ref Range   Glucose-Capillary 161 (H) 65 - 99 mg/dL   Comment 1 Capillary Specimen    Comment 2 Notify RN   Glucose, capillary     Status: Abnormal   Collection Time: 05/09/16  9:51 PM  Result Value Ref Range   Glucose-Capillary 160 (H) 65 - 99 mg/dL   Comment 1 Capillary Specimen    Comment 2 Notify RN   Protime-INR     Status: Abnormal   Collection Time: 05/10/16  3:14 AM  Result Value Ref Range   Prothrombin Time 20.4 (H) 11.6 - 15.2 seconds   INR 1.75 (H) 0.00 - 1.49  CBC     Status: Abnormal   Collection Time: 05/10/16  3:14 AM  Result Value Ref Range   WBC 13.0 (H) 4.0 - 10.5 K/uL   RBC 3.91 3.87 - 5.11 MIL/uL   Hemoglobin 10.0 (L) 12.0 - 15.0 g/dL   HCT 34.5 (L) 36.0 - 46.0 %   MCV 88.2 78.0 - 100.0 fL   MCH 25.6 (L) 26.0 - 34.0 pg   MCHC 29.0 (L) 30.0 - 36.0 g/dL   RDW 18.0 (H) 11.5 - 15.5 %   Platelets 611 (H) 150 - 400 K/uL  Basic metabolic panel     Status: Abnormal   Collection Time: 05/10/16  3:14 AM  Result Value Ref Range   Sodium 134 (L) 135 - 145 mmol/L   Potassium 3.9 3.5 - 5.1 mmol/L   Chloride 90 (L) 101 - 111 mmol/L   CO2 36 (H) 22 - 32 mmol/L   Glucose, Bld 195 (H) 65 - 99 mg/dL   BUN 11 6 -  20 mg/dL   Creatinine, Ser 0.82 0.44 - 1.00 mg/dL   Calcium 10.0 8.9 - 10.3 mg/dL   GFR calc non Af Amer >60 >60 mL/min   GFR calc Af Amer >60 >60 mL/min    Comment: (NOTE) The eGFR has  been calculated using the CKD EPI equation. This calculation has not been validated in all clinical situations. eGFR's persistently <60 mL/min signify possible Chronic Kidney Disease.    Anion gap 8 5 - 15  Glucose, capillary     Status: Abnormal   Collection Time: 05/10/16  7:23 AM  Result Value Ref Range   Glucose-Capillary 142 (H) 65 - 99 mg/dL   Comment 1 Capillary Specimen    Comment 2 Notify RN   Glucose, capillary     Status: Abnormal   Collection Time: 05/10/16  1:49 PM  Result Value Ref Range   Glucose-Capillary 135 (H) 65 - 99 mg/dL   Comment 1 Capillary Specimen    Comment 2 Notify RN   Glucose, capillary     Status: Abnormal   Collection Time: 05/10/16  6:06 PM  Result Value Ref Range   Glucose-Capillary 137 (H) 65 - 99 mg/dL   Comment 1 Capillary Specimen   Glucose, capillary     Status: Abnormal   Collection Time: 05/10/16  9:43 PM  Result Value Ref Range   Glucose-Capillary 172 (H) 65 - 99 mg/dL   Comment 1 Capillary Specimen   Protime-INR     Status: Abnormal   Collection Time: 05/11/16  4:57 AM  Result Value Ref Range   Prothrombin Time 18.7 (H) 11.6 - 15.2 seconds   INR 1.56 (H) 0.00 - 1.49  CBC     Status: Abnormal   Collection Time: 05/11/16  4:57 AM  Result Value Ref Range   WBC 13.3 (H) 4.0 - 10.5 K/uL   RBC 4.12 3.87 - 5.11 MIL/uL   Hemoglobin 10.4 (L) 12.0 - 15.0 g/dL   HCT 36.1 36.0 - 46.0 %   MCV 87.6 78.0 - 100.0 fL   MCH 25.2 (L) 26.0 - 34.0 pg   MCHC 28.8 (L) 30.0 - 36.0 g/dL   RDW 18.0 (H) 11.5 - 15.5 %   Platelets 680 (H) 150 - 400 K/uL  Basic metabolic panel     Status: Abnormal   Collection Time: 05/11/16  4:57 AM  Result Value Ref Range   Sodium 134 (L) 135 - 145 mmol/L   Potassium 4.0 3.5 - 5.1 mmol/L   Chloride 94 (L) 101 - 111 mmol/L   CO2  36 (H) 22 - 32 mmol/L   Glucose, Bld 188 (H) 65 - 99 mg/dL   BUN 10 6 - 20 mg/dL   Creatinine, Ser 0.85 0.44 - 1.00 mg/dL   Calcium 11.0 (H) 8.9 - 10.3 mg/dL   GFR calc non Af Amer >60 >60 mL/min   GFR calc Af Amer >60 >60 mL/min    Comment: (NOTE) The eGFR has been calculated using the CKD EPI equation. This calculation has not been validated in all clinical situations. eGFR's persistently <60 mL/min signify possible Chronic Kidney Disease.    Anion gap 4 (L) 5 - 15  Glucose, capillary     Status: Abnormal   Collection Time: 05/11/16  8:55 AM  Result Value Ref Range   Glucose-Capillary 135 (H) 65 - 99 mg/dL   Comment 1 Notify RN    Dg Chest Port 1 View  05/11/2016  CLINICAL DATA: Atelectasis.  CHF.  Shortness of breath. EXAM: PORTABLE CHEST 1 VIEW COMPARISON:  05/08/2016. FINDINGS: Left PICC line stable position. Prior CABG and cardiac valve replacement. Cardiomegaly with diffuse bilateral pulmonary infiltrates suggesting congestive heart failure. Bilateral pneumonia cannot be excluded. Interim improvement of bilateral pleural effusions. No pneumothorax . IMPRESSION: 1.   Stable position of left PICC line. 2. Prior CABG and cardiac valve replacement. Persistent cardiomegaly and bilateral pulmonary infiltrates and or edema noted. Small bilateral pleural effusions, improved prior exam . Electronically Signed   By: Thomas  Register   On: 05/11/2016 07:20       Medical Problem List and Plan: 1.  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  -admit to inpatient rehab  - Patient with sternal precautions after mitral valve repair 2.  DVT Prophylaxis/Anticoagulation: Coumadin therapy after mitral valve repair 3. Pain Management: Oxycodone as needed 4. Mood/panic attacks: Lexapro 10 mg daily at bedtime, trazodone 100 mg daily at bedtime, Klonopin 0.5 mg daily at bedtime and 3  times a day as needed 5. Neuropsych: This patient is generally capable of making decisions on her own behalf. 6. Skin/Wound Care: Routine skin checks 7. Fluids/Electrolytes/Nutrition: Routine I&O with follow-up chemistries 8. Dysphagia. Dysphagia #3 thin liquids. Follow speech therapy 9. PAF with RVR. Continue amiodarone, Aldactone 50 mg daily, Tenormin 12.5 mg twice a day. Follow-up cardiology services 10. Acute on chronic diastolic congestive heart failure. Lasix 80 mg daily. Monitor for any signs of fluid overload  -needs daily weights 11. Diabetes mellitus with peripheral neuropathy. Check blood sugars before meals and at bedtime. Continue Levemir 28 units daily. Diabetic teaching  Post Admission Physician Evaluation: 1. Functional deficits secondary  to debility/recent CVA. 2. Patient is admitted to receive collaborative, interdisciplinary care between the physiatrist, rehab nursing staff, and therapy team. 3. Patient's level of medical complexity and substantial therapy needs in context of that medical necessity cannot be provided at a lesser intensity of care such as a SNF. 4. Patient has experienced substantial functional loss from his/her baseline which was documented above under the "Functional History" and "Functional Status" headings.  Judging by the patient's diagnosis, physical exam, and functional history, the patient has potential for functional progress which will result in measurable gains while on inpatient rehab.  These gains will be of substantial and practical use upon discharge  in facilitating mobility and self-care at the household level. 5. Physiatrist will provide 24 hour management of medical needs as well as oversight of the therapy plan/treatment and provide guidance as appropriate regarding the interaction of the two. 6. 24 hour rehab nursing will assist with bladder management, bowel management, safety, skin/wound care, disease management, medication administration,  pain management and patient education  and help integrate therapy concepts, techniques,education, etc. 7. PT will assess and treat for/with: Lower extremity strength, range of motion, stamina, balance, functional mobility, safety, adaptive techniques and equipment, NMR, visual-spatial awareness, w/c assessment, family ed.   Goals are: min to mod assist. 8. OT will assess and treat for/with: ADL's, functional mobility, safety, upper extremity strength, adaptive techniques and equipment, NMR, visual-spatial awareness, ego support, family ed.   Goals are: min to mod assist. Therapy may not yet proceed with showering this patient. 9. SLP will assess and treat for/with: cognition, communication, swallowing, education.  Goals are: supervision to min assist. 10. Case Management and Social Worker will assess and treat for psychological issues and discharge planning. 11. Team conference will be held weekly to assess progress toward goals and to determine barriers to discharge. 12. Patient will receive at least 3 hours of therapy per day at least 5 days per week. 13. ELOS: 22-27 days       14. Prognosis:  good     Sharvi Mooneyhan T.   Avenly Roberge, MD, FAAPMR  Physical Medicine & Rehabilitation 05/12/2016   

## 2016-05-12 NOTE — Progress Notes (Signed)
Patient refusing oxy IR, and requesting ultram. Dan Angiulli, PA on call ordered 50-100 mg oxy Q4 PRN. Timoteo Ace, RN

## 2016-05-12 NOTE — Progress Notes (Signed)
OT Cancellation Note  Patient Details Name: Margaret [name] MRN: [id number] DOB: 09-Jun-1956   Cancelled Treatment:    Reason Eval/Treat Not Completed: Other (comment) - pt discharging to CIR today. Will defer further OT treatment to next venue of care.  Redmond Baseman, OTR/L Pager: [phone] 05/12/2016, 3:47 PM

## 2016-05-13 ENCOUNTER — Inpatient Hospital Stay (HOSPITAL_COMMUNITY): Payer: Medicaid Other | Admitting: Speech Pathology

## 2016-05-13 ENCOUNTER — Telehealth: Payer: Self-pay | Admitting: Hematology and Oncology

## 2016-05-13 ENCOUNTER — Inpatient Hospital Stay (HOSPITAL_COMMUNITY): Payer: Medicaid Other

## 2016-05-13 ENCOUNTER — Inpatient Hospital Stay (HOSPITAL_COMMUNITY): Payer: Self-pay | Admitting: Physical Therapy

## 2016-05-13 ENCOUNTER — Encounter (HOSPITAL_COMMUNITY): Payer: Self-pay | Admitting: *Deleted

## 2016-05-13 ENCOUNTER — Inpatient Hospital Stay (HOSPITAL_COMMUNITY): Payer: Self-pay | Admitting: Occupational Therapy

## 2016-05-13 DIAGNOSIS — G8190 Hemiplegia, unspecified affecting unspecified side: Secondary | ICD-10-CM

## 2016-05-13 DIAGNOSIS — I6930 Unspecified sequelae of cerebral infarction: Secondary | ICD-10-CM

## 2016-05-13 DIAGNOSIS — E1142 Type 2 diabetes mellitus with diabetic polyneuropathy: Secondary | ICD-10-CM

## 2016-05-13 DIAGNOSIS — J42 Unspecified chronic bronchitis: Secondary | ICD-10-CM

## 2016-05-13 DIAGNOSIS — D72829 Elevated white blood cell count, unspecified: Secondary | ICD-10-CM

## 2016-05-13 DIAGNOSIS — I509 Heart failure, unspecified: Secondary | ICD-10-CM

## 2016-05-13 DIAGNOSIS — E1149 Type 2 diabetes mellitus with other diabetic neurological complication: Secondary | ICD-10-CM

## 2016-05-13 DIAGNOSIS — I63239 Cerebral infarction due to unspecified occlusion or stenosis of unspecified carotid arteries: Secondary | ICD-10-CM

## 2016-05-13 LAB — CBC WITH DIFFERENTIAL/PLATELET
BASOS ABS: 0.1 10*3/uL (ref 0.0–0.1)
BASOS PCT: 0 %
EOS ABS: 0.6 10*3/uL (ref 0.0–0.7)
Eosinophils Relative: 4 %
HCT: 38.3 % (ref 36.0–46.0)
HEMOGLOBIN: 10.9 g/dL — AB (ref 12.0–15.0)
Lymphocytes Relative: 16 %
Lymphs Abs: 2.6 10*3/uL (ref 0.7–4.0)
MCH: 25.3 pg — ABNORMAL LOW (ref 26.0–34.0)
MCHC: 28.5 g/dL — AB (ref 30.0–36.0)
MCV: 89.1 fL (ref 78.0–100.0)
Monocytes Absolute: 1.1 10*3/uL — ABNORMAL HIGH (ref 0.1–1.0)
Monocytes Relative: 7 %
NEUTROS PCT: 73 %
Neutro Abs: 11.7 10*3/uL — ABNORMAL HIGH (ref 1.7–7.7)
Platelets: 738 10*3/uL — ABNORMAL HIGH (ref 150–400)
RBC: 4.3 MIL/uL (ref 3.87–5.11)
RDW: 17.8 % — ABNORMAL HIGH (ref 11.5–15.5)
WBC: 16 10*3/uL — AB (ref 4.0–10.5)

## 2016-05-13 LAB — COMPREHENSIVE METABOLIC PANEL
ALBUMIN: 2.9 g/dL — AB (ref 3.5–5.0)
ALK PHOS: 96 U/L (ref 38–126)
ALT: 25 U/L (ref 14–54)
ANION GAP: 9 (ref 5–15)
AST: 37 U/L (ref 15–41)
BUN: 17 mg/dL (ref 6–20)
CALCIUM: 11.8 mg/dL — AB (ref 8.9–10.3)
CO2: 31 mmol/L (ref 22–32)
Chloride: 95 mmol/L — ABNORMAL LOW (ref 101–111)
Creatinine, Ser: 0.92 mg/dL (ref 0.44–1.00)
GFR calc Af Amer: >60 mL/min (ref >60)
GFR calc non Af Amer: >60 mL/min (ref >60)
GLUCOSE: 123 mg/dL — AB (ref 65–99)
Potassium: 4.5 mmol/L (ref 3.5–5.1)
SODIUM: 135 mmol/L (ref 135–145)
Total Bilirubin: 0.4 mg/dL (ref 0.3–1.2)
Total Protein: 7 g/dL (ref 6.5–8.1)

## 2016-05-13 LAB — GLUCOSE, CAPILLARY
GLUCOSE-CAPILLARY: 123 mg/dL — AB (ref 65–99)
Glucose-Capillary: 124 mg/dL — ABNORMAL HIGH (ref 65–99)
Glucose-Capillary: 111 mg/dL — ABNORMAL HIGH (ref 65–99)
Glucose-Capillary: 148 mg/dL — ABNORMAL HIGH (ref 65–99)

## 2016-05-13 LAB — PROTIME-INR
INR: 2.82 — ABNORMAL HIGH (ref 0.00–1.49)
Prothrombin Time: 29.2 seconds — ABNORMAL HIGH (ref 11.6–15.2)

## 2016-05-13 MED ORDER — WARFARIN - PHARMACIST DOSING INPATIENT
Freq: Every day | Status: DC
  Administered 2016-05-19 – 2016-06-02 (×4)

## 2016-05-13 NOTE — Progress Notes (Signed)
Patient information reviewed and entered into eRehab system by Myquan Schaumburg, RN, CRRN, PPS Coordinator.  Information including medical coding and functional independence measure will be reviewed and updated through discharge.    

## 2016-05-13 NOTE — Telephone Encounter (Signed)
s.w. pt husband and moved 9.1 appt to 9.8...ok and aware

## 2016-05-13 NOTE — Care Management Note (Signed)
[facility] Individual Statement of Services  Patient Name:  Margaret [name]  Date:  05/13/2016  Welcome to the [facility].  Our goal is to provide you with an individualized program based on your diagnosis and situation, designed to meet your specific needs.  With this comprehensive rehabilitation program, you will be expected to participate in at least 3 hours of rehabilitation therapies Monday-Friday, with modified therapy programming on the weekends.  Your rehabilitation program will include the following services:  Physical Therapy (PT), Occupational Therapy (OT), Speech Therapy (ST), 24 hour per day rehabilitation nursing, Therapeutic Recreaction (TR), Neuropsychology, Case Management (Social Worker), Rehabilitation Medicine, Nutrition Services and Pharmacy Services  Weekly team conferences will be held on Wednesday to discuss your progress.  Your Social Worker will talk with you frequently to get your input and to update you on team discussions.  Team conferences with you and your family in attendance may also be held.  Expected length of stay: 20-24 days Overall anticipated outcome: min/mod wheelchair level  Depending on your progress and recovery, your program may change. Your Social Worker will coordinate services and will keep you informed of any changes. Your Social Worker's name and contact numbers are listed  below.  The following services may also be recommended but are not provided by the [facility] will be made to provide these services after discharge if needed.  Arrangements include referral to agencies that provide these services.  Your insurance has been verified to be:  Medicaid approved 6/19 Your primary doctor is:  York Ram  Pertinent information will be shared with your doctor and  your insurance company.  Social Worker:  Ovidio Kin, [place] or ([phone]  Information discussed with and copy given to patient by: Elease Hashimoto, 05/13/2016, 12:17 PM

## 2016-05-13 NOTE — Evaluation (Signed)
Speech Language Pathology Assessment and Plan  Patient Details  Name: Margaret [name] MRN: [id number] Date of Birth: [birth year]  SLP Diagnosis: Dysarthria;Speech and Language deficits;Cognitive Impairments;Dysphagia  Rehab Potential: Good ELOS: 18-22 days    Today's Date: 05/13/2016 SLP Individual Time: 4742-5956 SLP Individual Time Calculation (min): 50 min  Missed 10 minutes due to chest x-ray   Problem List:  Patient Active Problem List   Diagnosis Date Noted  . Type 2 diabetes mellitus with peripheral neuropathy (HCC)   . Debilitated 05/12/2016  . Left hemiparesis ([place]) 05/12/2016  . History of subarachnoid hemorrhage   . SOB (shortness of breath)   . History of CVA with residual deficit   . Dysarthria, post-stroke   . Chronic obstructive pulmonary disease ([place])   . Tachypnea   . Bradycardia   . Hypokalemia   . Leukocytosis   . SIRS (systemic inflammatory response syndrome) (HCC)   . Acute blood loss anemia   . Thrombocytosis ([place])   . S/P mitral valve repair 04/30/2016  . 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 Acute on chronic diastolic congestive heart failure due to valvular disease ([place]) 04/14/2016  . CHF due to valvular disease, acute on chronic, diastolic 38/75/6433  . Chronic diastolic congestive heart failure due to valvular disease ([place]) 04/13/2016  . History of stroke 04/13/2016  . Cerebrovascular accident (CVA) due to occlusion of right carotid artery ([place]) 03/25/2016  . Splenic infarct 03/25/2016  . Type 2 diabetes mellitus with circulatory disorder ([place]) 03/25/2016  . Mitral regurgitation 03/06/2016  . HLD (hyperlipidemia) 03/06/2016  . Right carotid artery occlusion   . Epigastric abdominal tenderness on direct palpation   . Endocarditis of mitral valve   . Positive ANA (antinuclear antibody)   . Streptococcus viridans infection 02/04/2016  .  Cerebrovascular accident (CVA) due to embolism of cerebral artery ([place])   . Subarachnoid hemorrhage ([place]) 02/01/2016  . Stroke ([place])   . Visual changes   . Pulmonary infiltrates 01/23/2016  . Pulmonary hypertension assoc with unclear multi-factorial mechanisms ([place]) 01/23/2016  . 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 Insulin dependent diabetes mellitus ([place]) 01/14/2016  . Essential hypertension 01/14/2016  . Hepatic cirrhosis ([place]) 01/14/2016  . COPD (chronic obstructive pulmonary disease) ([place]) 04/27/2012  . Chronic diastolic CHF (congestive heart failure) ([place]) 04/27/2012  . Morbid obesity ([place]) 04/27/2012  . Hypoxia 04/27/2012  . Dyspnea on exertion 04/27/2012  . Smoker 04/27/2012   Past Medical History:  Past Medical History  Diagnosis Date  . Hypertension   . Hyperlipidemia   . COPD (chronic obstructive pulmonary disease) ([place]) 04/20/12  . Asthma   . Type II diabetes mellitus ([place])   . Anemia   . H/O hiatal hernia   . Osteoarthritis   . Pneumonia 04/2012  . Migraines   . Panic attacks 04/20/12  . Morbid obesity ([place])   . Heart murmur   . Bacterial endocarditis     Strep viridans   . Chronic diastolic CHF (congestive heart failure) ([place]) 04/27/2012  . Pulmonary hypertension assoc with unclear multi-factorial mechanisms ([place]) 01/23/2016  . Positive ANA (antinuclear antibody)   . 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 Stroke ([place])   . Subarachnoid bleed ([place]) 02/01/2016  . S/P mitral valve repair 04/30/2016    Complex valvuloplasty including autologous pericardial patch repair of perforated posterior leaflet, artificial Gore-tex neochord placement x6 and 26 mm Sorin Memo 3D ring annuloplasty   Past Surgical History:  Past Surgical History  Procedure Laterality Date  . Laceration repair  1966    "right upper arm"  . Dilation and curettage of uterus  1990's  . Finger reattachment  1973    "right ring finger"  . Tee without cardioversion N/A 02/05/2016    Procedure: TRANSESOPHAGEAL  ECHOCARDIOGRAM (TEE);  Surgeon: Larey Dresser, MD;  Location: [facility];  Service: Cardiovascular;  Laterality: N/A;  . Radiology with anesthesia N/A 02/06/2016    Procedure: RADIOLOGY WITH ANESTHESIA;  Surgeon: Luanne Bras, MD;  Location: [facility];  Service: Radiology;  Laterality: N/A;  . Cardiac catheterization N/A 04/23/2016    Procedure: Right/Left Heart Cath and Coronary Angiography;  Surgeon: Sherren Mocha, MD;  Location: [facility];  Service: Cardiovascular;  Laterality: N/A;  . Mitral valve repair N/A 04/30/2016    Procedure: MITRAL VALVE REPAIR ;  Surgeon: Rexene Alberts, MD;  Location: [facility];  Service: Open Heart Surgery;  Laterality: N/A;  . Tee without cardioversion N/A 04/30/2016    Procedure: TRANSESOPHAGEAL ECHOCARDIOGRAM (TEE);  Surgeon: Rexene Alberts, MD;  Location: [facility];  Service: Open Heart Surgery;  Laterality: N/A;    Assessment / Plan / Recommendation Clinical Impression Margaret [name] is a 60 y.o. 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 At the nursing facility patient remained essentially bed bound. Prior nursing home placement patient live with her husband. She has assistance from her husband's sister's son and family. One level home. Presented 04/23/2016 plan mitral valve surgery and underwent mitral valve repair 04/30/2016 per Dr. Roxy [name]. Sternal precautions as directed. Hospital course pain management. Patient was short run of PAF with RVR placed on amiodarone with follow-up for cardiology per services. Dysphagia maintain on dysphagia #3 thin liquid diet. Placed on  Coumadin therapy after mitral valve repair. Acute on chronic blood loss anemia hemoglobin 10.4 . patient with fluid overload maintained on Lasix infusion and close monitoring of renal function. Physical and occupational therapy ongoing family requesting to have patient discharged to home instead of back to skilled nursing facility. Recommendations for physical medicine rehabilitation consult.   Patient was admitted to [facility] 05/12/16 and demonstrates mild cognitive impairments characterized by impaired visuospatial/perceptual abilities, sustained attention to task, delayed recall of new information, and categorical naming which impact the patient's overall safety with functional self-care tasks. Patient also demonstrates mild left inattention as well as left-sided oral impairments which impacts speech intelligibility and efficient mastication and oral clearance of regular textures.  Patient would benefit from skilled SLP intervention in order to maximize her functional independence prior to discharge. Anticipate patient will require 24 hour supervision at home and follow up SLP services.    Skilled Therapeutic Interventions          Bedside swallow and cognitive-linguistic evaluations completed with results and recommendations reviewed with patient.  Skilled treatment initiated with focus on addressing cognition goals. SLP facilitated session by providing Mod verbal cues for sustained attention to and initiation of tasks.  Patient also required Mod cues to self-monitor and correct oral holding of all POs as a result full supervision is needed for safety.  Continue with plan of care.       SLP Assessment  Patient will need skilled Speech Lanaguage Pathology Services during CIR admission    Recommendations  SLP Diet Recommendations: Dysphagia 3 (Mech soft);Thin Liquid Administration via: Cup;Straw Medication Administration: Whole meds with liquid Supervision: Full supervision/cueing  for compensatory strategies;Patient able to self feed Compensations: Minimize environmental distractions;Slow rate;Small sips/bites;Lingual sweep for clearance of pocketing Postural Changes and/or Swallow Maneuvers: Seated upright 90 degrees Oral Care Recommendations: Oral care BID Patient destination: Home Follow up Recommendations: 24 hour supervision/assistance;Outpatient SLP Equipment Recommended: None recommended by SLP    SLP Frequency 3 to 5 out of 7 days   SLP Duration  SLP Intensity  SLP Treatment/Interventions 18-22 days  Minumum of 1-2 x/day, 30 to 90 minutes  Cognitive remediation/compensation;Cueing hierarchy;Dysphagia/aspiration precaution training;Environmental controls;Functional tasks;Internal/external aids;Patient/family education;Speech/Language facilitation;Therapeutic Activities    Pain Pain Assessment Pain Assessment: No/denies pain  Prior Functioning Cognitive/Linguistic Baseline: Within functional limits (per pt report) Type of Home: House  Lives With: Spouse Available Help at Discharge: Family;Available 24 hours/day Vocation: Full time employment  Function:  Eating Eating   Modified Consistency Diet: Yes Eating Assist Level: Helper checks for pocketed food;Supervision or verbal cues;Set up assist for   Eating Set Up Assist For: Opening containers       Cognition Comprehension Comprehension assist level: Follows basic conversation/direction with extra time/assistive device  Expression   Expression assist level: Expresses basic 50 - 74% of the time/requires cueing 25 - 49% of the time. Needs to repeat parts of sentences.  Social Interaction Social Interaction assist level: Interacts appropriately 50 - 74% of the time - May be physically or verbally inappropriate.  Problem Solving Problem solving assist level: Solves basic 75 - 89% of the time/requires cueing 10 - 24% of the time  Memory Memory assist level: Recognizes or recalls 75 - 89% of the  time/requires cueing 10 - 24% of the time   Short Term Goals: Week 1: SLP Short Term Goal 1 (Week 1): Patient will demonstrate effecient mastication and oral clearance of regular textures with Min verbal cues, over two sessions to demo readiness for upgrade SLP Short Term Goal 2 (Week 1): Patient will self-monitor and correct oral holding of PO with Min verbal cues  SLP Short Term Goal 3 (Week 1): Patient will sustain attention to functional, familiar tasks for 5-8 minutes with Min verbal cues for redirection  SLP Short Term Goal 4 (Week 1): Patient will utilize speech intelligibility and word finding strategeis during structured phrase-sentence level communication tasks with Min verbal cues  SLP Short Term Goal 5 (Week 1): Patient will utilize external aids to assist with recall of new, daily information with Min vebral cues  SLP Short Term Goal 6 (Week 1): Patient will solve problems during completion of familiar tasks with Mod verbal cues to self-minitor and correct errors  Refer to Care Plan for Long Term Goals  Recommendations for other services: None  Discharge Criteria: Patient will be discharged from SLP if patient refuses treatment 3 consecutive times without medical reason, if treatment goals not met, if there is a change in medical status, if patient makes no progress towards goals or if patient is discharged from hospital.  The above assessment, treatment plan, treatment alternatives and goals were discussed and mutually agreed upon: by patient  Gunnar Fusi, M.A., CCC-SLP [phone]  [place] 05/13/2016, 1:02 PM

## 2016-05-13 NOTE — Progress Notes (Signed)
Social Work  Social Work Assessment and Plan  Patient Details  Name: Margaret [name] MRN: [id number] Date of Birth: 27-Sep-1956  Today's Date: 05/13/2016  Problem List:  Patient Active Problem List   Diagnosis Date Noted  . Type 2 diabetes mellitus with peripheral neuropathy (HCC)   . Debilitated 05/12/2016  . Left hemiparesis ([place]) 05/12/2016  . History of subarachnoid hemorrhage   . SOB (shortness of breath)   . History of CVA with residual deficit   . Dysarthria, post-stroke   . Chronic obstructive pulmonary disease ([place])   . Tachypnea   . Bradycardia   . Hypokalemia   . Leukocytosis   . SIRS (systemic inflammatory response syndrome) (HCC)   . Acute blood loss anemia   . Thrombocytosis ([place])   . S/P mitral valve repair 04/30/2016  . 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 Acute on chronic diastolic congestive heart failure due to valvular disease ([place]) 04/14/2016  . CHF due to valvular disease, acute on chronic, diastolic [id number]  . Chronic diastolic congestive heart failure due to valvular disease ([place]) 04/13/2016  . History of stroke 04/13/2016  . Cerebrovascular accident (CVA) due to occlusion of right carotid artery ([place]) 03/25/2016  . Splenic infarct 03/25/2016  . Type 2 diabetes mellitus with circulatory disorder ([place]) 03/25/2016  . Mitral regurgitation 03/06/2016  . HLD (hyperlipidemia) 03/06/2016  . Right carotid artery occlusion   . Epigastric abdominal tenderness on direct palpation   . Endocarditis of mitral valve   . Positive ANA (antinuclear antibody)   . Streptococcus viridans infection 02/04/2016  . Cerebrovascular accident (CVA) due to embolism of cerebral artery ([place])   . Subarachnoid hemorrhage ([place]) 02/01/2016  . Stroke ([place])   . Visual changes   . Pulmonary infiltrates 01/23/2016  . Pulmonary hypertension assoc with unclear multi-factorial mechanisms  ([place]) 01/23/2016  . 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 Insulin dependent diabetes mellitus ([place]) 01/14/2016  . Essential hypertension 01/14/2016  . Hepatic cirrhosis ([place]) 01/14/2016  . COPD (chronic obstructive pulmonary disease) ([place]) 04/27/2012  . Chronic diastolic CHF (congestive heart failure) ([place]) 04/27/2012  . Morbid obesity ([place]) 04/27/2012  . Hypoxia 04/27/2012  . Dyspnea on exertion 04/27/2012  . Smoker 04/27/2012   Past Medical History:  Past Medical History  Diagnosis Date  . Hypertension   . Hyperlipidemia   . COPD (chronic obstructive pulmonary disease) ([place]) 04/20/12  . Asthma   . Type II diabetes mellitus ([place])   . Anemia   . H/O hiatal hernia   . Osteoarthritis   . Pneumonia 04/2012  . Migraines   . Panic attacks 04/20/12  . Morbid obesity ([place])   . Heart murmur   . Bacterial endocarditis     Strep viridans   . Chronic diastolic CHF (congestive heart failure) ([place]) 04/27/2012  . Pulmonary hypertension assoc with unclear multi-factorial mechanisms ([place]) 01/23/2016  . Positive ANA (antinuclear antibody)   . 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 Stroke ([place])   . Subarachnoid bleed ([place]) 02/01/2016  . S/P mitral valve repair 04/30/2016    Complex valvuloplasty including autologous pericardial patch repair of perforated posterior leaflet, artificial Gore-tex neochord placement x6 and 26 mm Sorin Memo 3D ring annuloplasty   Past Surgical History:  Past Surgical History  Procedure Laterality Date  . Laceration repair  1966    "right upper arm"  . Dilation and curettage of uterus  1990's  . Finger reattachment  1973    "  right ring finger"  . Tee without cardioversion N/A 02/05/2016    Procedure: TRANSESOPHAGEAL ECHOCARDIOGRAM (TEE);  Surgeon: Larey Dresser, MD;  Location: [facility];  Service: Cardiovascular;  Laterality: N/A;  . Radiology with anesthesia N/A 02/06/2016    Procedure: RADIOLOGY WITH ANESTHESIA;  Surgeon: Luanne Bras, MD;  Location: [facility];  Service:  Radiology;  Laterality: N/A;  . Cardiac catheterization N/A 04/23/2016    Procedure: Right/Left Heart Cath and Coronary Angiography;  Surgeon: Sherren Mocha, MD;  Location: [facility];  Service: Cardiovascular;  Laterality: N/A;  . Mitral valve repair N/A 04/30/2016    Procedure: MITRAL VALVE REPAIR ;  Surgeon: Rexene Alberts, MD;  Location: [facility];  Service: Open Heart Surgery;  Laterality: N/A;  . Tee without cardioversion N/A 04/30/2016    Procedure: TRANSESOPHAGEAL ECHOCARDIOGRAM (TEE);  Surgeon: Rexene Alberts, MD;  Location: [facility];  Service: Open Heart Surgery;  Laterality: N/A;   Social History:  reports that she quit smoking about 4 years ago. Her smoking use included Cigarettes. She has a 8 pack-year smoking history. She has never used smokeless tobacco. She reports that she does not drink alcohol or use illicit drugs.  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 Their son does visit but is busy with work and his family. They have friends who are supportive and willing to assist once pt home.  Social History Preferred language: English Religion: Baptist Cultural Background: No issues Education: [employer] Read: Yes Write: Yes Employment Status: Disabled Date Retired/Disabled/Unemployed: Applicaiton pending or just approved need to confirm Freight forwarder Issues: No issues Guardian/Conservator: None-according to MD pt is capable of making her own decisions while here    Abuse/Neglect Physical Abuse: Denies Verbal Abuse: Denies Sexual Abuse: Denies Exploitation of patient/patient's resources: Denies Self-Neglect: Denies  Emotional Status Pt's affect, behavior adn adjustment status: Pt is motivated to improve and get back home, she has no plans to go back to a NH. She has arranged for husband and friends to assist her at home. She will do her best and try her hardest. She is glad to be here on rehab and realizes thi si her shot to do well. Recent Psychosocial Issues: other health issues-has been in and out of a hospital since 01/2016 Pyschiatric History: History of depression due to long hospitalization and multiple medical issues. She does take medication and feels this helps, but would also benefit from seeing neurro-psych while here. Will amke referral while here. She is open with her feelings and concerns. Substance Abuse History: Tobacco but has quit since being in a hospital or facility  Patient / Family Perceptions, Expectations & Goals Pt/Family understanding of illness & functional limitations: Pt and husband have a good understanding of her condition and treatment plan. She talks with the MD daily and feels her questions and concerns are being addressed. Her husband is here daily and talks with the MD also. Premorbid pt/family roles/activities: Wife, Mother, friend, retiree, chruch member, etc Anticipated changes in roles/activities/participation: resume Pt/family expectations/goals: Pt states: " I want to be able to do as much as I  can for myself by the time I leave here."  Husband states: " I am hopeful she will do well here and will take her home anyway."  [employer]: Other (Comment) (Was in [facility] from 3/17-6/1 when re-admitted back in hospital) Premorbid Home Care/DME Agencies: Other (Comment) (had in past) Transportation available at discharge: Husband and friends Resource referrals recommended:  Neuropsychology, Support group (specify)  Discharge Planning Living Arrangements: Spouse/significant other Support Systems: Spouse/significant other, Children, Other relatives, Friends/neighbors, Church/faith community Type of Residence: Skilled nursing facility (Prior to admission was in [facility]) Insurance underwriter Resources: [employer] (specify county) (Will confirm approved) Museum/gallery curator Resources: Family Support, SSD Financial Screen Referred: No Living Expenses: Lives with family Money Management: Spouse, Patient Does the patient have any problems obtaining your medications?: No Home Management: Both she and husband Patient/Family Preliminary Plans: Plan to return home after discharge from rehab. Husband and son are building a ramp and preparing the home. Between he, friend-Teresa and Teresa's husband-Buddy they will priovide 24 hr care to her. Will await team's evaluations and work on a safe discharge plan. Social Work Anticipated Follow Up Needs: HH/OP, Support Group  Clinical Impression Pleasant motivated female who is wanting to participate in therapies to make progress and get home from here. She has no intention of going back to a NH and between her husband, friend and friend's husband they will provide 24 hr care. She is hoping she can get to min assist level so not to burden her family and friends too much. Her Medicaid appears to have been approved, will check on disability claim and work on discharge plans. Pt would benefit from neuro-psych seeing while here due to long hospitalization and multiple medical issues.  Elease Hashimoto 05/13/2016, 12:33 PM

## 2016-05-13 NOTE — Evaluation (Signed)
Physical Therapy Assessment and Plan  Patient Details  Name: Margaret [name] MRN: [id number] Date of Birth: Jun 20, 1956  PT Diagnosis: Abnormal posture, Abnormality of gait, Hemiplegia non-dominant and Muscle weakness Rehab Potential: Fair ELOS: 2-3 weeks.    Today's Date: 05/13/2016 PT Individual Time: 0801-0902 AND 1001- 1032 PT Individual Time Calculation (min): 61 min  AND 31 min   Problem List:  Patient Active Problem List   Diagnosis Date Noted  . Type 2 diabetes mellitus with peripheral neuropathy (HCC)   . Debilitated 05/12/2016  . Left hemiparesis ([place]) 05/12/2016  . History of subarachnoid hemorrhage   . SOB (shortness of breath)   . History of CVA with residual deficit   . Dysarthria, post-stroke   . Chronic obstructive pulmonary disease ([place])   . Tachypnea   . Bradycardia   . Hypokalemia   . Leukocytosis   . SIRS (systemic inflammatory response syndrome) (HCC)   . Acute blood loss anemia   . Thrombocytosis ([place])   . S/P mitral valve repair 04/30/2016  . 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 Acute on chronic diastolic congestive heart failure due to valvular disease ([place]) 04/14/2016  . CHF due to valvular disease, acute on chronic, diastolic 89/21/1941  . Chronic diastolic congestive heart failure due to valvular disease ([place]) 04/13/2016  . History of stroke 04/13/2016  . Cerebrovascular accident (CVA) due to occlusion of right carotid artery ([place]) 03/25/2016  . Splenic infarct 03/25/2016  . Type 2 diabetes mellitus with circulatory disorder ([place]) 03/25/2016  . Mitral regurgitation 03/06/2016  . HLD (hyperlipidemia) 03/06/2016  . Right carotid artery occlusion   . Epigastric abdominal tenderness on direct palpation   . Endocarditis of mitral valve   . Positive ANA (antinuclear antibody)   . Streptococcus viridans infection 02/04/2016  . Cerebrovascular accident  (CVA) due to embolism of cerebral artery ([place])   . Subarachnoid hemorrhage ([place]) 02/01/2016  . Stroke ([place])   . Visual changes   . Pulmonary infiltrates 01/23/2016  . Pulmonary hypertension assoc with unclear multi-factorial mechanisms ([place]) 01/23/2016  . 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 Insulin dependent diabetes mellitus ([place]) 01/14/2016  . Essential hypertension 01/14/2016  . Hepatic cirrhosis ([place]) 01/14/2016  . COPD (chronic obstructive pulmonary disease) ([place]) 04/27/2012  . Chronic diastolic CHF (congestive heart failure) ([place]) 04/27/2012  . Morbid obesity ([place]) 04/27/2012  . Hypoxia 04/27/2012  . Dyspnea on exertion 04/27/2012  . Smoker 04/27/2012    Past Medical History:  Past Medical History  Diagnosis Date  . Hypertension   . Hyperlipidemia   . COPD (chronic obstructive pulmonary disease) ([place]) 04/20/12  . Asthma   . Type II diabetes mellitus ([place])   . Anemia   . H/O hiatal hernia   . Osteoarthritis   . Pneumonia 04/2012  . Migraines   . Panic attacks 04/20/12  . Morbid obesity ([place])   . Heart murmur   . Bacterial endocarditis     Strep viridans   . Chronic diastolic CHF (congestive heart failure) ([place]) 04/27/2012  . Pulmonary hypertension assoc with unclear multi-factorial mechanisms ([place]) 01/23/2016  . Positive ANA (antinuclear antibody)   . 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 Stroke ([place])   . Subarachnoid bleed ([place]) 02/01/2016  . S/P mitral valve repair 04/30/2016    Complex valvuloplasty including autologous pericardial patch repair of perforated posterior leaflet, artificial Gore-tex neochord placement x6 and 26 mm Sorin Memo 3D ring annuloplasty  Past Surgical History:  Past Surgical History  Procedure Laterality Date  . Laceration repair  1966    "right upper arm"  . Dilation and curettage of uterus  1990's  . Finger reattachment  1973    "right ring finger"  . Tee without cardioversion N/A 02/05/2016    Procedure: TRANSESOPHAGEAL ECHOCARDIOGRAM (TEE);   Surgeon: Larey Dresser, MD;  Location: [facility];  Service: Cardiovascular;  Laterality: N/A;  . Radiology with anesthesia N/A 02/06/2016    Procedure: RADIOLOGY WITH ANESTHESIA;  Surgeon: Luanne Bras, MD;  Location: [facility];  Service: Radiology;  Laterality: N/A;  . Cardiac catheterization N/A 04/23/2016    Procedure: Right/Left Heart Cath and Coronary Angiography;  Surgeon: Sherren Mocha, MD;  Location: [facility];  Service: Cardiovascular;  Laterality: N/A;  . Mitral valve repair N/A 04/30/2016    Procedure: MITRAL VALVE REPAIR ;  Surgeon: Rexene Alberts, MD;  Location: [facility];  Service: Open Heart Surgery;  Laterality: N/A;  . Tee without cardioversion N/A 04/30/2016    Procedure: TRANSESOPHAGEAL ECHOCARDIOGRAM (TEE);  Surgeon: Rexene Alberts, MD;  Location: [facility];  Service: Open Heart Surgery;  Laterality: N/A;    Assessment & Plan Clinical Impression: Patient is a 60 y.o. 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 At the nursing facility patient remained essentially bed bound. Prior nursing home placement patient live with her husband. She has assistance from her husband's sister's son and family. One level home. Presented 04/23/2016 plan mitral valve surgery and underwent mitral valve repair 04/30/2016 per Dr. Roxy Manns. Sternal precautions as directed. Hospital course pain management. Patient was short run of PAF with RVR placed on amiodarone with follow-up for cardiology per services. Dysphagia maintain on dysphagia #3 thin liquid diet. TPN initiated for nutritional support and discontinued as  nutrition improved. Placed on Coumadin therapy after mitral valve repair. Acute on chronic blood loss anemia hemoglobin 10.4 . patient with fluid overload maintained on Lasix infusion and close monitoring of renal function. Patient transferred to CIR on 05/12/2016 .   Patient currently requires max with mobility secondary to muscle weakness, impaired timing and sequencing, unbalanced muscle activation and decreased coordination, decreased attention to left and left side neglect and decreased sitting balance, decreased standing balance, decreased postural control, hemiplegia and decreased balance strategies.  Prior to hospitalization, patient was modified independent  with mobility and lived with Spouse in a House home.  Home access is 5Stairs to enter (pt reports that husband is having ramp built).  Patient will benefit from skilled PT intervention to maximize safe functional mobility, minimize fall risk and decrease caregiver burden for planned discharge home with 24 hour assist.  Anticipate patient will benefit from follow up [facility] at discharge.  PT - End of Session Activity Tolerance: Tolerates 30+ min activity with multiple rests Endurance Deficit: Yes PT Assessment Rehab Potential (ACUTE/IP ONLY): Fair Barriers to Discharge: Inaccessible home environment;Decreased caregiver support PT Patient demonstrates impairments in the following area(s): Motor;Edema;Balance;Perception;Safety PT Transfers Functional Problem(s): Bed Mobility;Bed to Chair;Car;Floor;Furniture PT Locomotion Functional Problem(s): Ambulation;Wheelchair Mobility;Stairs PT Plan PT Intensity: Minimum of 1-2 x/day ,45 to 90 minutes PT Frequency: 5 out of 7 days PT Duration Estimated Length of Stay: 2-3  weeks.  PT Treatment/Interventions: Ambulation/gait training;Balance/vestibular training;Cognitive remediation/compensation;Community reintegration;Discharge planning;Disease management/prevention;DME/adaptive equipment  instruction;Functional electrical stimulation;Functional mobility training;Neuromuscular re-education;Pain management;Patient/family education;Psychosocial support;Skin care/wound management;Splinting/orthotics;Stair training;Therapeutic Activities;Therapeutic Exercise;UE/LE Strength taining/ROM;UE/LE Coordination activities;Visual/perceptual remediation/compensation;Wheelchair propulsion/positioning PT Transfers Anticipated Outcome(s): mod A with LRAD.  PT Locomotion Anticipated Outcome(s): mod A for 56f with LRAD.  PT Recommendation Follow Up Recommendations: Home health therapy  Patient destination: Home.  Equipment Recommended: Wheelchair (measurements);Wheelchair cushion (measurements);Rolling walker with 5" wheels;Sliding board;To be determined    Skilled Therapeutic Intervention  Session 1 : Patient received supine in bed and agreeable to PT. Patient instructed by PT in evaluation and initiation of treatment intervention; see below for results. PT educated in safety, treatment intervention goals, and mobility post while in rehab and with d/c. Patient also instructed in sit<>stand and stand pivot transfer in stedy for use with nursing for toileting and transfers. PT provided mod-max cues for improved use of LE for sit<>stand and decreased pull with R UE due to Sternal precautions.    Session 2. Patient received sitting in WC and agreeable to PT. Patient performed sit<>stand transfer training with max A from PT in Parallel bars with PT to block L knee from buckling or hyperextension. Patient was able to performed standing tolerance for 90 seconds x 2 Patient also performed gait training with Max at LLE for 5 feet in parallel bars with R UE support. Following seconds sit<>stand transfer, patient reports incontinent bowel episode. PT transferred patient to room.  Stedy stand pivot transfer with Max A +2 for equipment management. Patient performed sit<>supine with max A from PT once in bed.   Patient left supine in bed with NT present.   PT Evaluation Precautions/Restrictions Precautions Precautions: Fall;Sternal Precaution Comments: L hemiparesis; L neglect; L subluxed shoulder Required Braces or Orthoses: Other Brace/Splint (left shoulder taped) Restrictions Weight Bearing Restrictions: No Other Position/Activity Restrictions: sternal precautions General   Vital Signs Pain Pain Assessment Pain Assessment: 0-10 Pain Score: 2  Pain Type: Acute pain Pain Location: Buttocks Pain Orientation: Mid Pain Descriptors / Indicators: Aching Pain Onset: Gradual Multiple Pain Sites: No Home Living/Prior Functioning Home Living Living Arrangements: Spouse/significant other Available Help at Discharge: Family;Available 24 hours/day Type of Home: House Home Access: Stairs to enter (pt reports that husband is having ramp built) ETechnical brewerof Steps: 5 Entrance Stairs-Rails: Right Home Layout: One level Bathroom Shower/Tub: Tub/shower unit;Curtain Bathroom Toilet: Standard Bathroom Accessibility: Yes Additional Comments: sposue and son working on ramp to be built wOfficeMax Incorporatedof 6/24  Lives With: Spouse Prior Function Level of Independence: Needs assistance with ADLs;Needs assistance with homemaking;Needs assistance with gait  Able to Take Stairs?: No Driving: No Vocation: Full time employment Comments: pt completely independent prior to 3/17, has been unable to ambulate since CVA Vision/Perception  Vision - Assessment Additional Comments: Pt reports floaters   Cognition Overall Cognitive Status: Impaired/Different from baseline Arousal/Alertness: Lethargic Orientation Level: Oriented X4 Attention: Sustained Sustained Attention: Impaired Sustained Attention Impairment: Functional basic;Functional complex Memory: Impaired Memory Impairment: Decreased recall of new information Awareness: Impaired Awareness Impairment: Emergent impairment Problem Solving:  Impaired Problem Solving Impairment: Functional basic;Functional complex Executive Function: Organizing;Self Monitoring;Self Correcting Organizing: Impaired Organizing Impairment: Functional basic;Functional complex Self Monitoring: Impaired Self Monitoring Impairment: Functional basic;Functional complex Self Correcting: Impaired Self Correcting Impairment: Functional basic;Functional complex Safety/Judgment: Impaired Sensation Sensation Light Touch: Appears Intact (LUE and LLE) Proprioception: Impaired by gross assessment (LUE and LLE) Coordination Gross Motor Movements are Fluid and Coordinated: No Fine Motor Movements are Fluid and Coordinated: No Coordination and  Movement Description: LLE and LUE movement greatly impaired. R UE &  LE WFL.  Motor  Motor Motor: Hemiplegia Motor - Skilled Clinical Observations: L side hemiplegia.   Mobility Bed Mobility Bed Mobility: Rolling Right;Rolling Left;Supine to Sit Rolling Right: 3: Mod assist Rolling Right Details: Verbal cues for sequencing;Verbal cues for technique;Visual cues/gestures for sequencing;Manual facilitation for weight shifting;Manual facilitation for placement;Verbal cues for safe use of DME/AE;Manual facilitation for weight bearing Rolling Left: 4: Min assist Rolling Left Details: Manual facilitation for placement;Manual facilitation for weight bearing;Verbal cues for precautions/safety;Verbal cues for safe use of DME/AE;Verbal cues for technique Supine to Sit: 3: Mod assist Supine to Sit Details: Manual facilitation for weight shifting;Manual facilitation for placement;Verbal cues for technique;Verbal cues for sequencing;Verbal cues for precautions/safety Transfers Transfers: Yes Sit to Stand: 2: Max assist Sit to Stand Details: Verbal cues for technique;Verbal cues for precautions/safety;Verbal cues for safe use of DME/AE;Manual facilitation for placement Stand to Sit: 2: Max assist Stand to Sit Details (indicate cue  type and reason): Verbal cues for sequencing;Verbal cues for technique;Verbal cues for precautions/safety;Verbal cues for safe use of DME/AE;Manual facilitation for placement Stand Pivot Transfers: 2: Max assist Stand Pivot Transfer Details: Verbal cues for sequencing;Verbal cues for technique;Manual facilitation for weight shifting;Verbal cues for precautions/safety;Verbal cues for gait pattern;Verbal cues for safe use of DME/AE;Manual facilitation for placement Locomotion  Ambulation Ambulation: Yes Ambulation/Gait Assistance: 2: Max assist Ambulation Distance (Feet): 5 Feet Assistive device: Parallel bars Ambulation/Gait Assistance Details: Manual facilitation for placement;Manual facilitation for weight shifting;Verbal cues for safe use of DME/AE;Verbal cues for gait pattern;Verbal cues for sequencing;Verbal cues for technique;Verbal cues for precautions/safety;Tactile cues for placement Ambulation/Gait Assistance Details: PT required to block L knee to prevent buckling.  Gait Gait: Yes Gait Pattern: Impaired Gait Pattern: Step-to pattern Stairs / Additional Locomotion Stairs: No Wheelchair Mobility Wheelchair Mobility: Yes Wheelchair Assistance: 3: Building surveyor Details: Verbal cues for sequencing;Verbal cues for technique;Verbal cues for precautions/safety;Verbal cues for safe use of DME/AE Wheelchair Propulsion: Right upper extremity;Right lower extremity Wheelchair Parts Management: Needs assistance Distance: 75f  Trunk/Postural Assessment  Cervical Assessment Cervical Assessment: Within Functional Limits Thoracic Assessment Thoracic Assessment: Exceptions to WFL (kyphosis) Lumbar Assessment Lumbar Assessment: Exceptions to WFL (flat back posture) Postural Control Postural Control: Deficits on evaluation  Balance Balance Balance Assessed: Yes Static Sitting Balance Static Sitting - Balance Support: No upper extremity supported Static Sitting - Level  of Assistance: 5: Stand by assistance Dynamic Sitting Balance Dynamic Sitting - Balance Support: Right upper extremity supported Dynamic Sitting - Level of Assistance: 5: Stand by assistance Static Standing Balance Static Standing - Balance Support: Right upper extremity supported Static Standing - Level of Assistance: 3: Mod assist Extremity Assessment  RUE Assessment RUE Assessment: Within Functional Limits LUE Assessment LUE Assessment: Exceptions to WFL LUE AROM (degrees) LUE Overall AROM Comments: decreased AROM, pt with increased pain during attempts to flex shoulder LUE PROM (degrees) LUE Overall PROM Comments: decreased PROM, pt with increased pain when therapist attempting to passivley move shoulder "I have a bad shoulder" Sublux also noted.  LUE Strength LUE Overall Strength: Deficits (flaccid LUE) RLE Assessment RLE Assessment: Within Functional Limits (Grossly 4/5 to 4+/5 throughout R LE. ) LLE Assessment LLE Assessment: Exceptions to WFL LLE AROM (degrees) LLE Overall AROM Comments: Patient unable to lift leg with any movement against gravity. PROM WFL LLE Strength LLE Overall Strength Comments: grossly 2-/5 throughout LLE with ability to perform slight contraction against resistance, but unable to lift against gravity.  See Function Navigator for Current Functional Status.   Refer to Care Plan for Long Term Goals  Recommendations for other services: None  Discharge Criteria: Patient will be discharged from PT if patient refuses treatment 3 consecutive times without medical reason, if treatment goals not met, if there is a change in medical status, if patient makes no progress towards goals or if patient is discharged from hospital.  The above assessment, treatment plan, treatment alternatives and goals were discussed and mutually agreed upon: by patient  Lorie Phenix 05/13/2016, 1:42 PM

## 2016-05-13 NOTE — Progress Notes (Signed)
ANTICOAGULATION CONSULT NOTE - Follow Up Consult  Pharmacy Consult for coumadin Indication: s/p mitral valve repair/afib  Allergies  Allergen Reactions  . Codeine Hives, Itching and Other (See Comments)    "breathing problems"    Patient Measurements: Weight: 229 lb 11.2 oz (104.191 kg) Heparin Dosing Weight:   Vital Signs: Temp: 97.9 F (36.6 C) (06/21 0631) Temp Source: Oral (06/21 0631) BP: 95/45 mmHg (06/21 0631) Pulse Rate: 46 (06/21 0631)  Labs:  Recent Labs  05/11/16 0457 05/12/16 0420 05/13/16 0641  HGB 10.4*  --  10.9*  HCT 36.1  --  38.3  PLT 680*  --  738*  LABPROT 18.7* 22.4* 29.2*  INR 1.56* 1.99* 2.82*  CREATININE 0.85  --   --     Estimated Creatinine Clearance: 84.3 mL/min (by C-G formula based on Cr of 0.85).   Medications:  Scheduled:  . amiodarone  400 mg Oral BID  . aspirin EC  81 mg Oral Daily  . atenolol  12.5 mg Oral BID  . clonazePAM  0.5 mg Oral QHS  . docusate sodium  200 mg Oral Daily  . escitalopram  10 mg Oral QHS  . famotidine  20 mg Oral BID  . furosemide  80 mg Oral Daily  . insulin aspart  0-20 Units Subcutaneous TID WC  . insulin detemir  28 Units Subcutaneous Daily  . nystatin   Topical BID  . spironolactone  50 mg Oral Daily  . traZODone  100 mg Oral QHS   Infusions:    Assessment: 60 yo female s/p mitral valve repair/afib is currently on therapeutic coumadin but INR jumped from 1.99 to 2.82.  Goal of Therapy:  INR 2.5-3.5 Monitor platelets by anticoagulation protocol: Yes   Plan:  - hold coumadin tonight - INR in am  Inell Mimbs, [name] 05/13/2016,8:28 AM

## 2016-05-13 NOTE — Progress Notes (Signed)
Patient complaining of buttocks pain and needing laxative pain. PRN sorbitol given, and RN disimpacted patient for a small amount due to patients tolerance. Thick paste like stool present in rectum that patient is unable to pass. Will continue to monitor after sorbitol has time to work. Timoteo Ace, RN

## 2016-05-13 NOTE — Progress Notes (Addendum)
[facility] PHYSICAL MEDICINE & REHABILITATION     PROGRESS NOTE  Subjective/Complaints:  Patient seen lying in bed this morning. She states she slept fairly overnight. She is equivocal about beginning therapies today.  ROS: Denies CP, SOB, nausea, vomiting, diarrhea.  Objective: Vital Signs: Blood pressure 95/45, pulse 46, temperature 97.9 F (36.6 C), temperature source Oral, resp. rate 16, weight 104.191 kg (229 lb 11.2 oz), SpO2 98 %. No results found.  Recent Labs  05/11/16 0457 05/13/16 0641  WBC 13.3* 16.0*  HGB 10.4* 10.9*  HCT 36.1 38.3  PLT 680* 738*    Recent Labs  05/11/16 0457  NA 134*  K 4.0  CL 94*  GLUCOSE 188*  BUN 10  CREATININE 0.85  CALCIUM 11.0*   CBG (last 3)   Recent Labs  05/12/16 1626 05/12/16 2059 05/13/16 0717  GLUCAP 127* 115* 123*    Wt Readings from Last 3 Encounters:  05/13/16 104.191 kg (229 lb 11.2 oz)  05/12/16 105.008 kg (231 lb 8 oz)  04/14/16 117.935 kg (260 lb)    Physical Exam:  BP 95/45 mmHg  Pulse 46  Temp(Src) 97.9 F (36.6 C) (Oral)  Resp 16  Wt 104.191 kg (229 lb 11.2 oz)  SpO2 98% Constitutional: She appears well-developed. Obese  HENT: Normocephalic and atraumatic.  Eyes: EOM are normal. Right eye exhibits no discharge. Left eye exhibits no discharge.  Cardiovascular: Regular rhythm. Bradycardia  Respiratory: No respiratory distress. Effort normal.  GI: Soft. Bowel sounds are normal.  Musculoskeletal: She exhibits edema. She exhibits no tenderness.  Neurological: She is alert. A cranial nerve deficit is present.  Left facial weakness  Severe dysarthria but intelligible.  Follows simple commands. Has reasonable insight and awareness. Memory fair. Delayed problem solving RUE/RLE: 4+/5 proximal to distal LUE 0/5 proximal to distal.  LLE: 2/5 proximal to distal. Skin: Chest incision clean dry and dressed. Vascular changes bilateral lower extremities  Psychiatric: Her affect is blunt and flat. Her  speech is delayed. She is slowed   Assessment/Plan: 1. 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 Physiatrist is providing close team supervision and 24 hour management of active medical problems listed below. Physiatrist and rehab team continue to assess barriers to discharge/monitor patient progress toward functional and medical goals.  Function:  Bathing Bathing position      Bathing parts      Bathing assist        Upper Body Dressing/Undressing Upper body dressing                    Upper body assist        Lower Body Dressing/Undressing Lower body dressing                                  Lower body assist        Toileting Toileting          Toileting assist     Transfers Chair/bed transfer             Locomotion Ambulation           Wheelchair          Cognition Comprehension Comprehension assist level: Follows complex conversation/direction with no assist  Expression Expression assist level:  Expresses complex 90% of the time/cues < 10% of the time  Social Interaction Social Interaction assist level: Interacts appropriately 90% of the time - Needs monitoring or encouragement for participation or interaction.  Problem Solving Problem solving assist level: Solves basic 75 - 89% of the time/requires cueing 10 - 24% of the time  Memory Memory assist level: More than reasonable amount of time    Medical Problem List and Plan: 1. 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 Sternal precautions after mitral valve repair  Begin CIR 2. DVT Prophylaxis/Anticoagulation: Coumadin  therapy after mitral valve repair 3. Pain Management: Oxycodone as needed 4. Mood/panic attacks: Lexapro 10 mg daily at bedtime, trazodone 100 mg daily at bedtime, Klonopin 0.5 mg daily at bedtime and 3 times a day as needed 5. Neuropsych: This patient is not fully capable of making decisions on her own behalf. 6. Skin/Wound Care: Routine skin checks 7. Fluids/Electrolytes/Nutrition: Routine I&O   BMP within acceptable range on 6/21 8. Dysphagia. Dysphagia #3 thin liquids. Follow speech therapy 9. PAF with RVR. Continue amiodarone, Aldactone 50 mg daily, Tenormin 12.5 mg twice a day. Follow-up cardiology services 10. Acute on chronic diastolic congestive heart failure. Lasix 80 mg daily. Monitor for any signs of fluid overload -daily weights 11. Diabetes mellitus with peripheral neuropathy. Check blood sugars before meals and at bedtime. Continue Levemir 28 units daily. Diabetic teaching 12. Leukocytosis  Currently afebrile  WBCs 16.0 on 6/21  UA/U culture ordered  Repeat chest x-ray ordered as last one showed ?infiltrates on 6/19  LOS (Days) 1 A FACE TO FACE EVALUATION WAS PERFORMED  Ankit Lorie Phenix 05/13/2016 8:50 AM

## 2016-05-13 NOTE — Plan of Care (Signed)
Problem: RH BOWEL ELIMINATION Goal: RH STG MANAGE BOWEL WITH ASSISTANCE STG Manage Bowel with min Assistance.  Outcome: Not Progressing Patient leaking stool with small smears. Complaining of discomfort.

## 2016-05-13 NOTE — Progress Notes (Signed)
Pt refused nocturnal bipap tonight.  Pt states she doesn't wear one at home, no hx of osa noted in chart.  Pt prefers nasal cannula.  RT advised pt that RT is available all night should she change her mind.  RN aware and will clarify rx with MD.

## 2016-05-13 NOTE — Evaluation (Signed)
Occupational Therapy Assessment and Plan & Session Notes   Patient Details  Name: Margaret [name] MRN: [id number] Date of Birth: 05/06/1956  OT Diagnosis: abnormal posture, acute pain, cognitive deficits, flaccid hemiplegia and hemiparesis, hemiplegia affecting non-dominant side and muscle weakness (generalized) Rehab Potential: Rehab Potential (ACUTE ONLY): Fair ELOS: 14-21 days (depending on progress)   Today's Date: 05/13/2016 OT Individual Time: 1300-1400 OT Individual Time Calculation (min): 60 min     Problem List:  Patient Active Problem List   Diagnosis Date Noted  . Type 2 diabetes mellitus with peripheral neuropathy (HCC)   . Debilitated 05/12/2016  . Left hemiparesis ([place]) 05/12/2016  . History of subarachnoid hemorrhage   . SOB (shortness of breath)   . History of CVA with residual deficit   . Dysarthria, post-stroke   . Chronic obstructive pulmonary disease ([place])   . Tachypnea   . Bradycardia   . Hypokalemia   . Leukocytosis   . SIRS (systemic inflammatory response syndrome) (HCC)   . Acute blood loss anemia   . Thrombocytosis ([place])   . S/P mitral valve repair 04/30/2016  . 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 Acute on chronic diastolic congestive heart failure due to valvular disease ([place]) 04/14/2016  . CHF due to valvular disease, acute on chronic, diastolic 46/27/0350  . Chronic diastolic congestive heart failure due to valvular disease ([place]) 04/13/2016  . History of stroke 04/13/2016  . Cerebrovascular accident (CVA) due to occlusion of right carotid artery ([place]) 03/25/2016  . Splenic infarct 03/25/2016  . Type 2 diabetes mellitus with circulatory disorder ([place]) 03/25/2016  . Mitral regurgitation 03/06/2016  . HLD (hyperlipidemia) 03/06/2016  . Right carotid artery occlusion   . Epigastric abdominal tenderness on direct palpation   . Endocarditis of mitral  valve   . Positive ANA (antinuclear antibody)   . Streptococcus viridans infection 02/04/2016  . Cerebrovascular accident (CVA) due to embolism of cerebral artery ([place])   . Subarachnoid hemorrhage ([place]) 02/01/2016  . Stroke ([place])   . Visual changes   . Pulmonary infiltrates 01/23/2016  . Pulmonary hypertension assoc with unclear multi-factorial mechanisms ([place]) 01/23/2016  . 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 Insulin dependent diabetes mellitus ([place]) 01/14/2016  . Essential hypertension 01/14/2016  . Hepatic cirrhosis ([place]) 01/14/2016  . COPD (chronic obstructive pulmonary disease) ([place]) 04/27/2012  . Chronic diastolic CHF (congestive heart failure) ([place]) 04/27/2012  . Morbid obesity ([place]) 04/27/2012  . Hypoxia 04/27/2012  . Dyspnea on exertion 04/27/2012  . Smoker 04/27/2012    Past Medical History:  Past Medical History  Diagnosis Date  . Hypertension   . Hyperlipidemia   . COPD (chronic obstructive pulmonary disease) ([place]) 04/20/12  . Asthma   . Type II diabetes mellitus ([place])   . Anemia   . H/O hiatal hernia   . Osteoarthritis   . Pneumonia 04/2012  . Migraines   . Panic attacks 04/20/12  . Morbid obesity ([place])   . Heart murmur   . Bacterial endocarditis     Strep viridans   . Chronic diastolic CHF (congestive heart failure) ([place]) 04/27/2012  . Pulmonary hypertension assoc with unclear multi-factorial mechanisms ([place]) 01/23/2016  . Positive ANA (antinuclear antibody)   . 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 Stroke ([place])   . Subarachnoid bleed ([place]) 02/01/2016  . S/P mitral valve repair 04/30/2016    Complex valvuloplasty including autologous pericardial patch repair of perforated posterior leaflet, artificial Gore-tex  neochord placement x6 and 26 mm Sorin Memo 3D ring annuloplasty   Past Surgical History:  Past Surgical History  Procedure Laterality Date  . Laceration repair  1966    "right upper arm"  . Dilation and curettage of uterus  1990's  . Finger reattachment  1973     "right ring finger"  . Tee without cardioversion N/A 02/05/2016    Procedure: TRANSESOPHAGEAL ECHOCARDIOGRAM (TEE);  Surgeon: Larey Dresser, MD;  Location: [facility];  Service: Cardiovascular;  Laterality: N/A;  . Radiology with anesthesia N/A 02/06/2016    Procedure: RADIOLOGY WITH ANESTHESIA;  Surgeon: Luanne Bras, MD;  Location: [facility];  Service: Radiology;  Laterality: N/A;  . Cardiac catheterization N/A 04/23/2016    Procedure: Right/Left Heart Cath and Coronary Angiography;  Surgeon: Sherren Mocha, MD;  Location: [facility];  Service: Cardiovascular;  Laterality: N/A;  . Mitral valve repair N/A 04/30/2016    Procedure: MITRAL VALVE REPAIR ;  Surgeon: Rexene Alberts, MD;  Location: [facility];  Service: Open Heart Surgery;  Laterality: N/A;  . Tee without cardioversion N/A 04/30/2016    Procedure: TRANSESOPHAGEAL ECHOCARDIOGRAM (TEE);  Surgeon: Rexene Alberts, MD;  Location: [facility];  Service: Open Heart Surgery;  Laterality: N/A;    Assessment & Plan Clinical Impression: Margaret [name] is a 60 y.o. 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 Presented 04/23/2016 plan mitral valve surgery and underwent mitral valve repair 04/30/2016 per Dr. Roxy Manns. Sternal precautions as directed. Hospital course pain management. Patient was short run of PAF with RVR placed on amiodarone with follow-up for cardiology per services. Dysphagia maintain on dysphagia #3 thin liquid diet. TPN initiated for nutritional support and discontinued as nutrition improved. Placed on Coumadin therapy after mitral valve repair.  Acute on chronic blood loss anemia hemoglobin 10.4 . patient with fluid overload maintained on Lasix infusion and close monitoring of renal function. Physical and occupational therapy ongoing family requesting to have patient discharged to home instead of back to skilled nursing facility. Patient transferred to CIR on 05/12/2016 .    Patient currently requires min to max assist with basic self-care skills and IADL secondary to muscle weakness and muscle paralysis, decreased oxygen support, abnormal tone and decreased coordination, decreased awareness, decreased problem solving, decreased memory and delayed processing and decreased sitting balance, decreased standing balance, decreased postural control, hemiplegia, decreased balance strategies and difficulty maintaining precautions.  Prior to CVA in March, patient could complete ADLs independently; pt has been [facility] ADL[facility] at [facility].   Patient will benefit from skilled intervention to increase independence with basic self-care skills prior to discharge home with care partner.  Anticipate patient will require 24 hour supervision and minimal physical assistance and follow up home health and vs rehab at SNF.  OT - End of Session Activity Tolerance: Tolerates 30+ min activity with multiple rests Endurance Deficit: Yes OT Assessment Rehab Potential (ACUTE ONLY): Fair Barriers to Discharge Comments: stoke 3 months out OT Patient demonstrates impairments in the following area(s): Balance;Edema;Endurance;Motor;Pain;Perception;Safety;Sensory;Skin Integrity;Vision OT Basic ADL's Functional Problem(s): Eating;Grooming;Bathing;Dressing;Toileting OT Advanced ADL's Functional Problem(s): Simple Meal Preparation;Laundry OT Transfers Functional Problem(s): Toilet;Tub/Shower OT Additional Impairment(s): Fuctional Use of Upper Extremity OT Plan OT Intensity:  Minimum of 1-2 x/day, 45 to 90 minutes OT Frequency: 5 out of 7 days OT Duration/Estimated Length of  Stay: 14-21 days (depending on progress) OT Treatment/Interventions: Cognitive remediation/compensation;Community reintegration;Discharge planning;Disease mangement/prevention;DME/adaptive equipment instruction;Neuromuscular re-education;Pain management;Patient/family education;Psychosocial support;Self Care/advanced ADL retraining;Skin care/wound managment;Splinting/orthotics;Therapeutic Activities;Therapeutic Exercise;UE/LE Strength taining/ROM;UE/LE Coordination activities;Wheelchair propulsion/positioning OT Self Feeding Anticipated Outcome(s): mod I OT Basic Self-Care Anticipated Outcome(s): min assist OT Toileting Anticipated Outcome(s): mod assist OT Bathroom Transfers Anticipated Outcome(s): mod assist  OT Recommendation Patient destination: Home Follow Up Recommendations: Home health OT;Skilled nursing facility;24 hour supervision/assistance (HHOT VS SNF depending on patient's progress) Equipment Recommended: 3 in 1 bedside comode;Tub/shower bench   Skilled Therapeutic Intervention Initial 1:1 occupational therapy evaluation completed. Focused skilled intervention on ADL retraining at bed level (pt working on donning/doffing of socks in circle sit type position), EOB level, and sink level, overall bed mobility, functional transfers using STEDY, and overall activity tolerance/endurance. Pt with increasing pain in buttock throughout session. At end of session, left pt seated in w/c with all needs within reach.   OT Evaluation Precautions/Restrictions  Precautions Precautions: Fall;Sternal Precaution Comments: L hemiparesis; L neglect; L subluxed shoulder Required Braces or Orthoses: Other Brace/Splint (left shoulder taped) Restrictions Weight Bearing Restrictions: No Other Position/Activity Restrictions: sternal precautions   General Chart Reviewed: Yes Family/Caregiver Present: No   Vital Signs Therapy Vitals Temp: 98 F (36.7 C) Temp Source: Oral Pulse Rate: (!) 56 Resp:  18 BP: (!) 105/50 mmHg Patient Position (if appropriate): Sitting Oxygen Therapy SpO2: 100 % O2 Device: Nasal Cannula O2 Flow Rate (L/min): 2 L/min   Pain Pain Assessment Pain Assessment: 0-10 Pain Score: 2  Pain Type: Acute pain Pain Location: Buttocks Pain Orientation: Mid Pain Descriptors / Indicators: Aching Pain Onset: Gradual Multiple Pain Sites: No   Home Living/Prior Functioning Home Living Living Arrangements: Spouse/significant other Available Help at Discharge: Family, Available 24 hours/day Type of Home: House Home Access: Stairs to enter (Patient reports that her husbamd is having ramp built. ) Technical brewer of Steps: 5 Entrance Stairs-Rails: Right Home Layout: One level Bathroom Shower/Tub: Chiropodist: Standard Bathroom Accessibility: Yes Additional Comments: sposue and son working on ramp to be built [employer] of 6/24  Lives With: Spouse Prior Function Level of Independence: Needs assistance with ADLs, Needs assistance with homemaking, Needs assistance with gait  Able to Take Stairs?: No Driving: No Vocation: Full time employment Comments: pt completely independent prior to 3/17, has been unable to ambulate since CVA  ADL - see FIM  Vision/Perception  Vision- History Baseline Vision/History: Wears glasses Vision- Assessment Vision Assessment?: Yes;Vision impaired- to be further tested in functional context Additional Comments: Pt reports floaters    Cognition Overall Cognitive Status: Impaired/Different from baseline Arousal/Alertness: Lethargic Memory: Impaired Memory Impairment: Decreased recall of new information Attention: Sustained Sustained Attention: Impaired Sustained Attention Impairment: Functional basic;Functional complex Awareness: Impaired Awareness Impairment: Emergent impairment Problem Solving: Impaired Problem Solving Impairment: Functional basic;Functional complex Executive Function:  Organizing;Self Monitoring;Self Correcting Organizing: Impaired Organizing Impairment: Functional basic;Functional complex Self Monitoring: Impaired Self Monitoring Impairment: Functional basic;Functional complex Self Correcting: Impaired Self Correcting Impairment: Functional basic;Functional complex Safety/Judgment: Impaired  Sensation Sensation Light Touch: Appears Intact Proprioception: Impaired by gross assessment Coordination Gross Motor Movements are Fluid and Coordinated: No Fine Motor Movements are Fluid and Coordinated: No Coordination and Movement Description: LLE and LUE movement greatly impaired. R LE WFL.   Motor  Motor Motor: Hemiplegia Motor - Skilled Clinical Observations: L side hemiplegia.   Mobility  Bed Mobility Bed  Mobility: Rolling Right;Rolling Left;Supine to Sit Rolling Right: 3: Mod assist Rolling Right Details: Verbal cues for sequencing;Verbal cues for technique;Visual cues/gestures for sequencing;Manual facilitation for weight shifting;Manual facilitation for placement;Verbal cues for safe use of DME/AE;Manual facilitation for weight bearing Rolling Left: 4: Min assist Rolling Left Details: Manual facilitation for placement;Manual facilitation for weight bearing;Verbal cues for precautions/safety;Verbal cues for safe use of DME/AE;Verbal cues for technique Supine to Sit: 3: Mod assist Supine to Sit Details: Manual facilitation for weight shifting;Manual facilitation for placement;Verbal cues for technique;Verbal cues for sequencing;Verbal cues for precautions/safety Transfers Sit to Stand: 2: Max assist Sit to Stand Details: Verbal cues for technique;Verbal cues for precautions/safety;Verbal cues for safe use of DME/AE;Manual facilitation for placement Stand to Sit: 2: Max assist Stand to Sit Details (indicate cue type and reason): Verbal cues for sequencing;Verbal cues for technique;Verbal cues for precautions/safety;Verbal cues for safe use of  DME/AE;Manual facilitation for placement   Trunk/Postural Assessment  Cervical Assessment Cervical Assessment: Within Functional Limits Thoracic Assessment Thoracic Assessment: Exceptions to [facility] (increased kyphosis) Lumbar Assessment Lumbar Assessment: Exceptions to WFL (flat back posture) Postural Control Postural Control: Deficits on evaluation   Balance Balance Balance Assessed: Yes Static Sitting Balance Static Sitting - Balance Support: No upper extremity supported Static Sitting - Level of Assistance: 5: Stand by assistance Dynamic Sitting Balance Dynamic Sitting - Balance Support: Right upper extremity supported Dynamic Sitting - Level of Assistance: 5: Stand by assistance Static Standing Balance Static Standing - Balance Support: Right upper extremity supported Static Standing - Level of Assistance: 3: Mod assist  Extremity/Trunk Assessment RUE Assessment RUE Assessment: Within Functional Limits LUE Assessment LUE Assessment: Exceptions to WFL LUE AROM (degrees) LUE Overall AROM Comments: decreased AROM, pt with increased pain during attempts to flex shoulder LUE PROM (degrees) LUE Overall PROM Comments: decreased PROM, pt with increased pain when therapist attempting to passivley move shoulder "I have a bad shoulder" Sublux also noted.  LUE Strength LUE Overall Strength: Deficits (flaccid LUE)   See Function Navigator for Current Functional Status.   Refer to Care Plan for Long Term Goals  Recommendations for other services: None  Discharge Criteria: Patient will be discharged from OT if patient refuses treatment 3 consecutive times without medical reason, if treatment goals not met, if there is a change in medical status, if patient makes no progress towards goals or if patient is discharged from hospital.  The above assessment, treatment plan, treatment alternatives and goals were discussed and mutually agreed upon: by patient  Chrys Racer , MS, OTR/L, CLT   05/13/2016, 2:37 PM

## 2016-05-14 ENCOUNTER — Inpatient Hospital Stay (HOSPITAL_COMMUNITY): Payer: Self-pay | Admitting: Physical Therapy

## 2016-05-14 ENCOUNTER — Inpatient Hospital Stay (HOSPITAL_COMMUNITY): Payer: Self-pay | Admitting: Occupational Therapy

## 2016-05-14 ENCOUNTER — Inpatient Hospital Stay (HOSPITAL_COMMUNITY): Payer: Medicaid Other | Admitting: Speech Pathology

## 2016-05-14 DIAGNOSIS — R609 Edema, unspecified: Secondary | ICD-10-CM

## 2016-05-14 DIAGNOSIS — G8104 Flaccid hemiplegia affecting left nondominant side: Secondary | ICD-10-CM

## 2016-05-14 LAB — URINALYSIS, ROUTINE W REFLEX MICROSCOPIC
BILIRUBIN URINE: NEGATIVE
Glucose, UA: NEGATIVE mg/dL
Hgb urine dipstick: NEGATIVE
Ketones, ur: NEGATIVE mg/dL
LEUKOCYTES UA: NEGATIVE
NITRITE: NEGATIVE
PH: 5.5 (ref 5.0–8.0)
Protein, ur: NEGATIVE mg/dL
SPECIFIC GRAVITY, URINE: 1.015 (ref 1.005–1.030)

## 2016-05-14 LAB — PROTIME-INR
INR: 3.17 — AB (ref 0.00–1.49)
Prothrombin Time: 31.9 seconds — ABNORMAL HIGH (ref 11.6–15.2)

## 2016-05-14 LAB — GLUCOSE, CAPILLARY
GLUCOSE-CAPILLARY: 87 mg/dL (ref 65–99)
GLUCOSE-CAPILLARY: 167 mg/dL — AB (ref 65–99)
GLUCOSE-CAPILLARY: 140 mg/dL — AB (ref 65–99)
Glucose-Capillary: 143 mg/dL — ABNORMAL HIGH (ref 65–99)

## 2016-05-14 MED ORDER — ENSURE ENLIVE PO LIQD
237.0000 mL | Freq: Two times a day (BID) | ORAL | Status: DC
  Administered 2016-05-17 – 2016-05-18 (×2): 237 mL via ORAL

## 2016-05-14 MED ORDER — HYDROCORTISONE ACE-PRAMOXINE 1-1 % RE FOAM
1.0000 | Freq: Two times a day (BID) | RECTAL | Status: DC | PRN
  Administered 2016-05-16 – 2016-05-18 (×2): 1 via RECTAL
  Filled 2016-05-14: qty 10

## 2016-05-14 MED ORDER — WARFARIN SODIUM 5 MG PO TABS
5.0000 mg | ORAL_TABLET | Freq: Once | ORAL | Status: AC
  Administered 2016-05-14: 5 mg via ORAL
  Filled 2016-05-14: qty 1

## 2016-05-14 MED ORDER — FUROSEMIDE 40 MG PO TABS
60.0000 mg | ORAL_TABLET | Freq: Two times a day (BID) | ORAL | Status: DC
  Administered 2016-05-14 – 2016-05-17 (×6): 60 mg via ORAL
  Filled 2016-05-14 (×6): qty 1

## 2016-05-14 NOTE — Progress Notes (Signed)
Orthopedic Tech Progress Note Patient Details:  Margaret [name] Aug 29, 1956 [id number]  Patient ID: [name], female   DOB: 1956-02-23, 60 y.o.   MRN: [id number]   [place] Pink 05/14/2016, 10:04 AMCalled [facility] for left Prafo boot, left Resting hand splint and wrist splint for day.

## 2016-05-14 NOTE — Progress Notes (Addendum)
      [address]       [place] [address]             [phone]     CARDIOTHORACIC SURGERY PROGRESS NOTE  14 Days Post-Op Procedure(s) (LRB): MITRAL VALVE REPAIR (N/A) TRANSESOPHAGEAL ECHOCARDIOGRAM (TEE) (N/A)  Subjective: Looks quite good.  Denies SOB.  Delighted with staff on rehab unit  Objective: Vital signs in last 24 hours: Temp:  [98 F (36.7 C)-98.2 F (36.8 C)] 98 F (36.7 C) (06/22 1416) Pulse Rate:  [44-68] 44 (06/22 1416) Cardiac Rhythm:  [-]  Resp:  [18] 18 (06/22 1416) BP: (110-112)/(56-63) 112/63 mmHg (06/22 1416) SpO2:  [100 %] 100 % (06/22 1416) Weight:  [228 lb (103.42 kg)] 228 lb (103.42 kg) (06/22 0500)  Physical Exam:  Breath sounds: clear  Heart sounds:  RRR w/out murmur  Incisions:  Healing nicely  Abdomen:  soft  Extremities:  warm   Intake/Output from previous day: 06/21 0701 - 06/22 0700 In: 600 [P.O.:600] Out: -  Intake/Output this shift:    Lab Results:  Recent Labs  05/13/16 0641  WBC 16.0*  HGB 10.9*  HCT 38.3  PLT 738*   BMET:  Recent Labs  05/13/16 0641  NA 135  K 4.5  CL 95*  CO2 31  GLUCOSE 123*  BUN 17  CREATININE 0.92  CALCIUM 11.8*    CBG (last 3)   Recent Labs  05/14/16 0703 05/14/16 1206 05/14/16 1630  GLUCAP 87 167* 143*   PT/INR:   Recent Labs  05/14/16 0517  LABPROT 31.9*  INR 3.17*    CXR:  PORTABLE CHEST 1 VIEW  COMPARISON: 05/11/2016  FINDINGS: Cardiomegaly again noted. Status post CABG. Mild interstitial prominence bilaterally right greater than left suspicious for mild interstitial edema. Mild right basilar atelectasis or infiltrate with slight improvement in aeration.  IMPRESSION: Status post CABG. Mild interstitial prominence bilaterally right greater than left suspicious for mild interstitial edema. Mild right basilar atelectasis or infiltrate with slight improvement in aeration.   Electronically Signed  By: Lahoma Crocker M.D.  On:  05/13/2016 10:03  Assessment/Plan:  Making reasonably good progress.  Appears stable from cardiac standpoint.  Appreciate care from entire PMR team.  Rexene Alberts, MD 05/14/2016

## 2016-05-14 NOTE — Progress Notes (Signed)
Occupational Therapy Session Note  Patient Details  Name: Margaret [name] MRN: [id number] Date of Birth: 1956-03-18  Today's Date: 05/14/2016 OT Individual Time: 0900-1000 OT Individual Time Calculation (min): 60 min    Short Term Goals: Week 1:  OT Short Term Goal 1 (Week 1): Pt will be mod I with bed mobility with HOB down as a precursor for ADLs  OT Short Term Goal 2 (Week 1): Pt will complete UB bathing and dressing seated EOB unsupported with moderate assistance  OT Short Term Goal 3 (Week 1): Pt will perform toilet transfer with mod assist  OT Short Term Goal 4 (Week 1): Pt will independently verbalize and adhere to sternal precautions 100% of the time  Skilled Therapeutic Interventions/Progress Updates:    1:1 self care retraining at shower level. Pt received in w/c. Transferred w/c to toilet with STEDY with max A for sit to stand. Pt unable to void but a small BM. Pt max A to stand in STEDY from the elevated  BSC. Transitioned into the shower with STEDY. Focus on hemi bathing techniques and activity tolerance. Worked off the O2 and sats varied from low 80s to 94% on RA. After shower applied 2 liters to assist with lower O2 sats and fatigue.  Pt donned UB bra and shirt in stedy with focus postural control at midline with mod cuing (verbal and tactile). Also brushed teeth in stedy.  Pt able to come up into standing from stedy with min A but from a seated surface required max A. Pt requested to return to bed to rest after session. Pants donned in supine with rolling side to side. Pt able to roll with min A to the right and left with extra time and cuing for sequence.    Therapy Documentation Precautions:  Precautions Precautions: Fall, Sternal Precaution Comments: L hemiparesis; L neglect; L subluxed shoulder Required Braces or Orthoses: Other Brace/Splint (left shoulder taped) Restrictions Weight Bearing Restrictions: Yes Other Position/Activity Restrictions: sternal  precautions Pain: C/o pain around anus- reported to RN to assess and provided breaks as needed See Function Navigator for Current Functional Status.   Therapy/Group: Individual Therapy  Willeen Cass [facility] 05/14/2016, 10:42 AM

## 2016-05-14 NOTE — Progress Notes (Signed)
Initial Nutrition Assessment  DOCUMENTATION CODES:   Obesity unspecified  INTERVENTION:  Provide Ensure Enlive po BID, each supplement provides 350 kcal and 20 grams of protein.  Encourage adequate PO intake.   NUTRITION DIAGNOSIS:   Increased nutrient needs related to chronic illness as evidenced by estimated needs.  GOAL:   Patient will meet greater than or equal to 90% of their needs  MONITOR:   PO intake, Supplement acceptance, Weight trends, Labs, I & O's  REASON FOR ASSESSMENT:   Malnutrition Screening Tool    ASSESSMENT:   60 y.o. 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 Presented 04/23/2016 plan mitral valve surgery and underwent mitral valve repair 04/30/2016 Presents with debility after mitral valve repair, h/o r CVA.  Meal completion has been 25-100%. Pt reports appetite is fine. Pt reports eating well at home with 2-3 meals a day. Pt reports usual body weight of ~253 lbs. Per Epic weight records, pt with a 21.6% weight loss in 4 months. Noted pt on lasix. Weight loss may be related to fluid status. Pt is agreeable to Ensure to aid in caloric and protein needs.   Pt with no observed significant fat or muscle mass loss.   Labs and medications reviewed.   Diet Order:  DIET DYS 3 Room service appropriate?: Yes; Fluid consistency:: Thin  Skin:  Reviewed, no issues  Last BM:  6/21  Height:   Ht Readings from Last 1 Encounters:  04/30/16 5\' 5"  (1.651 m)    Weight:   Wt Readings from Last 1 Encounters:  05/14/16 228 lb (103.42 kg)    Ideal Body Weight:  56.8  kg  BMI:  Body mass index is 37.94 kg/(m^2).  Estimated Nutritional Needs:   Kcal:  1800-1950  Protein:  95-105 grams  Fluid:  Per MD  EDUCATION NEEDS:   No education needs identified at this time  Corrin Parker, MS, RD, LDN Pager # [phone] After hours/ weekend pager # [phone]

## 2016-05-14 NOTE — Patient Care Conference (Signed)
Inpatient RehabilitationTeam Conference and Plan of Care Update Date: 05/13/2016   Time: 2:25 PM    Patient Name: Margaret [name]      Medical Record Number: [id number]  Date of Birth: 02-15-1956 Sex: Female         Room/Bed: 4W04C/4W04C-01 Payor Info: Payor: MEDICAID  / Plan: MEDICAID OF  / Product Type: *No Product type* /    Admitting Diagnosis: Milltralvalve Replacment HO CVA  Admit Date/Time:  05/12/2016  6:13 PM Admission Comments: No comment available   Primary Diagnosis:  Cerebrovascular accident (CVA) due to occlusion of right carotid artery ([place]) Principal Problem: Cerebrovascular accident (CVA) due to occlusion of right carotid artery [facility])  Patient Active Problem List   Diagnosis Date Noted  . Flaccid hemiplegia affecting left nondominant side ([place])   . Interstitial edema   . Type 2 diabetes mellitus with peripheral neuropathy (HCC)   . Debilitated 05/12/2016  . Left hemiparesis ([place]) 05/12/2016  . History of subarachnoid hemorrhage   . SOB (shortness of breath)   . History of CVA with residual deficit   . Dysarthria, post-stroke   . Chronic obstructive pulmonary disease ([place])   . Tachypnea   . Bradycardia   . Hypokalemia   . Leukocytosis   . SIRS (systemic inflammatory response syndrome) (HCC)   . Acute blood loss anemia   . Thrombocytosis ([place])   . S/P mitral valve repair 04/30/2016  . 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 Acute on chronic diastolic congestive heart failure due to valvular disease ([place]) 04/14/2016  . CHF due to valvular disease, acute on chronic, diastolic [id number]  . Chronic diastolic congestive heart failure due to valvular disease ([place]) 04/13/2016  . History of stroke 04/13/2016  . Cerebrovascular accident (CVA) due to occlusion of right carotid artery ([place]) 03/25/2016  . Splenic infarct 03/25/2016  . Type 2 diabetes mellitus with  circulatory disorder ([place]) 03/25/2016  . Mitral regurgitation 03/06/2016  . HLD (hyperlipidemia) 03/06/2016  . Right carotid artery occlusion   . Epigastric abdominal tenderness on direct palpation   . Endocarditis of mitral valve   . Positive ANA (antinuclear antibody)   . Streptococcus viridans infection 02/04/2016  . Cerebrovascular accident (CVA) due to embolism of cerebral artery ([place])   . Subarachnoid hemorrhage ([place]) 02/01/2016  . Stroke ([place])   . Visual changes   . Pulmonary infiltrates 01/23/2016  . Pulmonary hypertension assoc with unclear multi-factorial mechanisms ([place]) 01/23/2016  . 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 Insulin dependent diabetes mellitus ([place]) 01/14/2016  . Essential hypertension 01/14/2016  . Hepatic cirrhosis ([place]) 01/14/2016  . COPD (chronic obstructive pulmonary disease) ([place]) 04/27/2012  . Chronic diastolic CHF (congestive heart failure) ([place]) 04/27/2012  . Morbid obesity ([place]) 04/27/2012  . Hypoxia 04/27/2012  . Dyspnea on exertion 04/27/2012  . Smoker 04/27/2012    Expected Discharge Date: Expected Discharge Date: 06/04/16  Team Members Present: Physician leading conference: Dr. Delice Lesch Social Worker Present: Ovidio Kin, LCSW Nurse Present: Heather Roberts, RN PT Present: Barrie Folk, PT;Victoria Sabra Heck, PT OT Present: Willeen Cass, OT SLP Present: Gunnar Fusi, SLP PPS Coordinator present : Daiva Nakayama, RN, CRRN     Current Status/Progress Goal Weekly Team Focus  Medical   Debilitation secondary to mitral valve repair 04/30/2016 as well as recent history of subarachnoid hemorrhage with left-sided residual weakness and dysarthria with leukocytosis  Improve safety, mobility, transfers  See above   Bowel/Bladder  cont x2 LBM: 6/20 but only smears. Patient is constipated with hard stool unable to disimpact due to patient intolerance   cont x2   promote regular BM, educated on PRN medications    Swallow/Nutrition/ Hydration   Dys.3  textures and thin liquids with full supervision   least restrictive PO ModI   continue trials of regular textures and carryover of safe swallow strategies   ADL's   transfers with STEDY or max A; bathing with mod A ; dressing with max A - total A  standing balance with min A, bathing min A, dressing min A and toilet transfers with mod A; toileting with mod A  transfer training, trunk control at midline, activity tolerance, weaning off O2, sit to stands, standing balance, utilizing hemi techinques for bathing and dressing   Mobility     new eval        Communication   Min-Mod assist   Supervision  education and use of word finding strategies    Safety/Cognition/ Behavioral Observations  Min-Mod assist   Supervision   increase sustained attention, recall of new information, awareness and problem solving     Pain   Complaining of butt pain from stool. PRN ultram given. patient refuses to take oxy saying it makes her have more problems.   <2  assess and treat qshift and PRN. have an adequate BM    Skin   sternal incision, lower incision with steri strips, and gauze due to a report of minimal drainage (No new drainage seen)   no further breakdown/infection  continue to monitor for infection/breakdown qshift & PRN       *See Care Plan and progress notes for long and short-term goals.  Barriers to Discharge: Leukocytosis, safety, transfers, mobility    Possible Resolutions to Barriers:  UA/Ucx ordered, CXR ordered, pt and family edu, follow labs    Discharge Planning/Teaching Needs:    Home with husband, friend and her husband to assist with her care. Aware will need 24 hr care, building ramp this weekend.     Team Discussion:  New eval-goals min/mod wheelchair level. Elevated white count checking chest x-ray and urine. Sternal precautions limits her in therapies. Wean off O2, has been using since NH stay may not need.   Revisions to Treatment Plan:  New eval   Continued Need for Acute  Rehabilitation Level of Care: The patient requires daily medical management by a physician with specialized training in physical medicine and rehabilitation for the following conditions: Daily direction of a multidisciplinary physical rehabilitation program to ensure safe treatment while eliciting the highest outcome that is of practical value to the patient.: Yes Daily medical management of patient stability for increased activity during participation in an intensive rehabilitation regime.: Yes Daily analysis of laboratory values and/or radiology reports with any subsequent need for medication adjustment of medical intervention for : Cardiac problems;Neurological problems;Diabetes problems;Other  Elease Hashimoto 05/15/2016, 8:37 AM

## 2016-05-14 NOTE — Progress Notes (Signed)
Occupational Therapy Session Note  Patient Details  Name: Margaret [name] MRN: [id number] Date of Birth: 01/14/1956  Today's Date: 05/14/2016 OT Individual Time: 1415-1500 OT Individual Time Calculation (min): 45 min   Short Term Goals: Week 1:  OT Short Term Goal 1 (Week 1): Pt will be mod I with bed mobility with HOB down as a precursor for ADLs  OT Short Term Goal 2 (Week 1): Pt will complete UB bathing and dressing seated EOB unsupported with moderate assistance  OT Short Term Goal 3 (Week 1): Pt will perform toilet transfer with mod assist  OT Short Term Goal 4 (Week 1): Pt will independently verbalize and adhere to sternal precautions 100% of the time  Skilled Therapeutic Interventions/Progress Updates:  Pt found seated in w/c with 6/10 complaints of pain around buttock, monitored this during session. Patient worked on w/c propulsion using RUE and RLE from room to therapy gym, taking increased time and multiple rest breaks needed. Once in therapy gym engaged pt in neuromuscular re-education to LUE; shoulder shrugs, shoulder retraction, shoulder flexion (UE ranger), shoulder abduction (UE ranger), elbow flexion/extension, forearm supination/pronation, wrist flexion/extension - all X10. Therapist assisted pt back to room and left pt seated in w/c with all needs within reach. Pt on 2L/min supplemental 02 entire session.   Therapy Documentation Precautions:  Precautions Precautions: Fall, Sternal Precaution Comments: L hemiparesis; L neglect; L subluxed shoulder Required Braces or Orthoses: Other Brace/Splint (left shoulder taped) Restrictions Weight Bearing Restrictions: Yes Other Position/Activity Restrictions: sternal precautions  Vital Signs: Therapy Vitals Temp: 98.2 F (36.8 C) Temp Source: Oral Pulse Rate: 68 Resp: 18 BP: (!) 110/56 mmHg Patient Position (if appropriate): Sitting Oxygen Therapy SpO2: 100 % O2 Device: Nasal Cannula O2 Flow Rate (L/min): 2  L/min  See Function Navigator for Current Functional Status.  Therapy/Group: Individual Therapy  Chrys Racer , MS, OTR/L, CLT  05/14/2016, 3:02 PM

## 2016-05-14 NOTE — Progress Notes (Signed)
[facility]ICAL [facility]ICINE & REHABILITATION     PROGRESS NOTE  Subjective/Complaints:  Patient seen lying in bed this morning. She states that she is cold and requested that I turn up the temperature in the room.  ROS: Denies CP, SOB, nausea, vomiting, diarrhea.  Objective: Vital Signs: Blood pressure 110/56, pulse 68, temperature 98.2 F (36.8 C), temperature source Oral, resp. rate 18, weight 103.42 kg (228 lb), SpO2 100 %. Dg Chest Port 1 View  05/13/2016  CLINICAL DATA:  Leukocytosis, hypertension EXAM: PORTABLE CHEST 1 VIEW COMPARISON:  05/11/2016 FINDINGS: Cardiomegaly again noted. Status post CABG. Mild interstitial prominence bilaterally right greater than left suspicious for mild interstitial edema. Mild right basilar atelectasis or infiltrate with slight improvement in aeration. IMPRESSION: Status post CABG. Mild interstitial prominence bilaterally right greater than left suspicious for mild interstitial edema. Mild right basilar atelectasis or infiltrate with slight improvement in aeration. Electronically Signed   By: Lahoma Crocker M.D.   On: 05/13/2016 10:03    Recent Labs  05/13/16 0641  WBC 16.0*  HGB 10.9*  HCT 38.3  PLT 738*    Recent Labs  05/13/16 0641  NA 135  K 4.5  CL 95*  GLUCOSE 123*  BUN 17  CREATININE 0.92  CALCIUM 11.8*   CBG (last 3)   Recent Labs  05/13/16 1648 05/13/16 2106 05/14/16 0703  GLUCAP 111* 148* 87    Wt Readings from Last 3 Encounters:  05/14/16 103.42 kg (228 lb)  05/12/16 105.008 kg (231 lb 8 oz)  04/14/16 117.935 kg (260 lb)    Physical Exam:  BP 110/56 mmHg  Pulse 68  Temp(Src) 98.2 F (36.8 C) (Oral)  Resp 18  Wt 103.42 kg (228 lb)  SpO2 100% Constitutional: She appears well-developed. Obese  HENT: Normocephalic and atraumatic.  Eyes: EOM are normal. Right eye exhibits no discharge. Left eye exhibits no discharge.  Cardiovascular: Regular Rate and rhythm. Respiratory: No respiratory distress. Effort  normal.  GI: Soft. Bowel sounds are normal.  Musculoskeletal: She exhibits edema. She exhibits no tenderness.  Neurological: She is alert. A cranial nerve deficit is present.  Left facial weakness  Severe dysarthria but intelligible.  Follows simple commands. Has reasonable insight and awareness. Memory fair. Delayed problem solving RUE/RLE: 4+/5 proximal to distal LUE 0/5 proximal to distal.  LLE: 2/5 proximal to distal. No increase in tone noted Skin: Chest incision clean dry and dressed. Vascular changes bilateral lower extremities  Psychiatric: Her affect is blunt and flat. Her speech is delayed. She is slowed   Assessment/Plan: 1. 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 Physiatrist is providing close team supervision and 24 hour management of active medical problems listed below. Physiatrist and rehab team continue to assess barriers to discharge/monitor patient progress toward functional and medical goals.  Function:  Bathing Bathing position   Position: Wheelchair/chair at sink (LB bathing completed at bed level)  Bathing parts Body parts bathed by patient: Right arm, Chest, Abdomen, Right upper leg, Left upper leg, Right lower leg, Left lower leg Body parts bathed by helper: Left arm, Front perineal area, Buttocks, Back  Bathing assist Assist Level: Touching or steadying assistance(Pt > 75%), More than reasonable time      Upper Body Dressing/Undressing Upper body dressing   What is the patient wearing?: Hospital gown  Upper body assist Assist Level: Touching or steadying assistance(Pt > 75%)      Lower Body Dressing/Undressing Lower body dressing   What is the patient wearing?: Hospital Gown                               Lower body assist Assist for lower body dressing: Touching or steadying assistance (Pt > 75%)      Toileting Toileting Toileting activity did not occur: N/A        Toileting assist     Transfers Chair/bed transfer   Chair/bed transfer method: Stand pivot Chair/bed transfer assist level: Maximal assist (Pt 25 - 49%/lift and lower) Chair/bed transfer assistive device: Bedrails, Armrests     Locomotion Ambulation     Max distance: 34ft Assist level: Maximal assist (Pt 25 - 49%)   Wheelchair   Type: Manual Max wheelchair distance: 18ft Assist Level: Moderate assistance (Pt 50 - 74%)  Cognition Comprehension Comprehension assist level: Follows basic conversation/direction with extra time/assistive device  Expression Expression assist level: Expresses basic 50 - 74% of the time/requires cueing 25 - 49% of the time. Needs to repeat parts of sentences.  Social Interaction Social Interaction assist level: Interacts appropriately 50 - 74% of the time - May be physically or verbally inappropriate.  Problem Solving Problem solving assist level: Solves basic 75 - 89% of the time/requires cueing 10 - 24% of the time  Memory Memory assist level: Recognizes or recalls 75 - 89% of the time/requires cueing 10 - 24% of the time    Medical Problem List and Plan: 1. 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 Sternal precautions after mitral valve repair  Continue CIR  PRAFO, hand splints ordered 2. DVT Prophylaxis/Anticoagulation: Coumadin therapy after mitral valve repair  INR supratherapeutic today. Appreciate pharmacy recs. 3. Pain Management: Oxycodone as needed 4. Mood/panic attacks: Lexapro 10 mg daily at bedtime, trazodone 100 mg daily at bedtime, Klonopin 0.5 mg daily at bedtime and 3 times a day as needed 5. Neuropsych: This patient is not fully  capable of making decisions on her own behalf. 6. Skin/Wound Care: Routine skin checks 7. Fluids/Electrolytes/Nutrition: Routine I&O   BMP within acceptable range on 6/21 8. Dysphagia. Dysphagia #3 thin liquids. Follow speech therapy 9. PAF with RVR. Continue amiodarone, Aldactone 50 mg daily, Tenormin 12.5 mg twice a day. Follow-up cardiology services 10. Acute on chronic diastolic congestive heart failure.   Lasix 80 mg daily, increased to 60 BID on 6/22 .  daily weights  Monitor for any signs of fluid overload Filed Weights   05/13/16 0631 05/14/16 0500  Weight: 104.191 kg (229 lb 11.2 oz) 103.42 kg (228 lb)  11. Diabetes mellitus with peripheral neuropathy. Check blood sugars before meals and at bedtime. Continue Levemir 28 units daily. Diabetic teaching 12. Leukocytosis  Currently afebrile  WBCs 16.0 on 6/21  UA/U culture ordered - pending  Chest x-ray from 6/21 showing interstitial edema and? Infiltrate/atelectasis (appears to have slightly improved from previous x-ray)  LOS (Days) 2 A FACE TO FACE EVALUATION WAS PERFORMED  Ankit Lorie Phenix 05/14/2016 9:09 AM

## 2016-05-14 NOTE — IPOC Note (Signed)
Overall Plan of Care [facility]) Patient Details Name: Margaret [name] MRN: [id number] DOB: 11-10-1956  Admitting Diagnosis: Milltralvalve Replacment HO CVA  Hospital Problems: Principal Problem:   Cerebrovascular accident (CVA) due to occlusion of right carotid artery ([place]) Active Problems:   COPD (chronic obstructive pulmonary disease) ([place])   Essential hypertension   Acute on chronic diastolic congestive heart failure due to valvular disease (HCC)   Debilitated   Left hemiparesis (HCC)   Type 2 diabetes mellitus with peripheral neuropathy (HCC)     Functional Problem List: Nursing Behavior, Bowel, Edema, Endurance, Pain, Skin Integrity  PT Motor, Edema, Balance, Perception, Safety  OT Balance, Edema, Endurance, Motor, Pain, Perception, Safety, Sensory, Skin Integrity, Vision  SLP Cognition, Linguistic, Safety  TR         Basic ADL's: OT Eating, Grooming, Bathing, Dressing, Toileting     Advanced  ADL's: OT Simple Meal Preparation, Laundry     Transfers: PT Bed Mobility, Bed to Chair, Car, Floor, Manufacturing systems engineer, Metallurgist: PT Ambulation, Emergency planning/management officer, Stairs     Additional Impairments: OT Fuctional Use of Upper Extremity  SLP Swallowing, Communication, Social Cognition expression Social Interaction, Problem Solving, Memory, Attention, Awareness  TR      Anticipated Outcomes Item Anticipated Outcome  Self Feeding mod I  Swallowing  Supervision    Basic self-care  min assist  Toileting  mod assist   Bathroom Transfers mod assist   Bowel/Bladder  cont with min assist   Transfers  mod A with LRAD.   Locomotion  mod A for 88ft with LRAD.   Communication  Supervision   Cognition  Supervision   Pain  <3  Safety/Judgment  mod I    Therapy Plan: PT Intensity: Minimum of 1-2 x/day ,45 to 90 minutes PT Frequency: 5 out of 7 days PT Duration Estimated Length of Stay: 2-3 weeks.  OT Intensity: Minimum of 1-2 x/day, 45 to  90 minutes OT Frequency: 5 out of 7 days OT Duration/Estimated Length of Stay: 14-21 days (depending on progress) SLP Intensity: Minumum of 1-2 x/day, 30 to 90 minutes SLP Frequency: 3 to 5 out of 7 days SLP Duration/Estimated Length of Stay: 18-22 days       Team Interventions: Nursing Interventions Patient/Family Education, Bowel Management, Pain Management, Medication Management, Discharge Planning  PT interventions Ambulation/gait training, Balance/vestibular training, Cognitive remediation/compensation, Community reintegration, Discharge planning, Disease management/prevention, DME/adaptive equipment instruction, Functional electrical stimulation, Functional mobility training, Neuromuscular re-education, Pain management, Patient/family education, Psychosocial support, Skin care/wound management, Splinting/orthotics, Stair training, Therapeutic Activities, Therapeutic Exercise, UE/LE Strength taining/ROM, UE/LE Coordination activities, Visual/perceptual remediation/compensation, Wheelchair propulsion/positioning  OT Interventions Cognitive remediation/compensation, Academic librarian, Discharge planning, Disease mangement/prevention, DME/adaptive equipment instruction, Neuromuscular re-education, Pain management, Patient/family education, Psychosocial support, Self Care/advanced ADL retraining, Skin care/wound managment, Splinting/orthotics, Therapeutic Activities, Therapeutic Exercise, UE/LE Strength taining/ROM, UE/LE Coordination activities, Wheelchair propulsion/positioning  SLP Interventions Cognitive remediation/compensation, English as a second language teacher, Dysphagia/aspiration precaution training, Environmental controls, Functional tasks, Internal/external aids, Patient/family education, Speech/Language facilitation, Therapeutic Activities  TR Interventions    SW/CM Interventions Discharge Planning, Psychosocial Support, Patient/Family Education    Team Discharge Planning: Destination: PT-Home  ,OT- Home , SLP-Home Projected Follow-up: PT-Home health PT, [place], Skilled nursing facility, 24 hour supervision/assistance ([place] VS SNF depending on patient's progress), SLP-24 hour supervision/assistance, Outpatient SLP Projected Equipment Needs: PT-Wheelchair (measurements), Wheelchair cushion (measurements), Rolling walker with 5" wheels, Sliding board, To be determined, OT- 3 in 1 bedside comode, Tub/shower bench, SLP-None recommended  by SLP Equipment Details: PT- , OT-  Patient/family involved in discharge planning: PT- Patient,  OT-Patient, SLP-Patient  MD ELOS: 18-21 days. Medical Rehab Prognosis:  Fair Assessment: 60 y.o. 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 At the nursing facility patient remained essentially bed bound. Prior nursing home placement patient live with her husband. She has assistance from her husband's sister's son and family. Presented 04/23/2016 plan mitral valve surgery and underwent mitral valve repair 04/30/2016 per Dr. Roxy Manns. Sternal precautions as directed. Hospital course pain management. Patient was short run of PAF with RVR placed on amiodarone with follow-up for cardiology per services. Dysphagia maintain on dysphagia #3 thin liquid diet. TPN initiated for nutritional support and discontinued as nutrition improved. Placed on Coumadin therapy after mitral valve repair. Acute on chronic blood loss anemia hemoglobin 10.4 . patient with fluid overload maintained on Lasix infusion and close monitoring of renal function. Physical and occupational therapy ongoing family  requesting to have patient discharged to home instead of back to skilled nursing facility. Patient with resultant functional deficits with weakness and gait abnormality. Will set goals for mod assist with PT and OT and supervision with SLP.   See Team Conference Notes for weekly updates to the plan of care

## 2016-05-14 NOTE — Progress Notes (Signed)
Physical Therapy Session Note  Patient Details  Name: Margaret [name] MRN: [id number] Date of Birth: [birth year]  Today's Date: 05/14/2016 PT Individual Time: 1102-1203 PT Individual Time Calculation (min): 61 min   Short Term Goals: Week 1:  PT Short Term Goal 1 (Week 1): Patient will perform bed mobility consistent ly with mod A  PT Short Term Goal 2 (Week 1): Patient will propell WC 163ft in controlled environment with min A.  PT Short Term Goal 3 (Week 1): Patient will ambulate 70ft with LRAD and max A from PT.  PT Short Term Goal 4 (Week 1): Patient will perform stedy transfers with mod A.   Skilled Therapeutic Interventions/Progress Updates:   Patient received supine in bed and agreeable to PT  Bed mobility sit<>supine x 2 with max A from PT and mod cues for improved BLE movement and use of UE for improved control of moveemnt.  PT instructed patient in stand pivot transfer x 6 for bed<> WC with max A from PT and max cues for improved weight shifting and lE placement.  Sit<>stand x 4 in parallel bars with max A from PT and cues for improve UE placement and WB through BLE.   Gait training for 1ft in parallel bars with max A from PT and to stabilize LLE.   Supine NMR hip flexion/extension x 10 BLE. Max cues for encouragement and improved activation of synergistic movements in flexion/extension patterns.   Patient returned to room and left sitting in [facility] with call bell within reach.   Therapy Documentation Precautions:  Precautions Precautions: Fall, Sternal Precaution Comments: L hemiparesis; L neglect; L subluxed shoulder Required Braces or Orthoses: Other Brace/Splint (left shoulder taped) Restrictions Weight Bearing Restrictions: Yes Other Position/Activity Restrictions: sternal precautions Pain: Pain Assessment Pain Assessment: No/denies pain Pain Score: 0-No pain   See Function Navigator for Current Functional Status.   Therapy/Group: Individual  Therapy  Lorie Phenix 05/14/2016, 12:51 PM

## 2016-05-14 NOTE — Progress Notes (Signed)
Speech Language Pathology Daily Session Note  Patient Details  Name: Margaret [name] MRN: [id number] Date of Birth: 10/26/1956  Today's Date: 05/14/2016 SLP Individual Time: 0832-0900 SLP Individual Time Calculation (min): 28 min  Short Term Goals: Week 1: SLP Short Term Goal 1 (Week 1): Patient will demonstrate effecient mastication and oral clearance of regular textures with Min verbal cues, over two sessions to demo readiness for upgrade SLP Short Term Goal 2 (Week 1): Patient will self-monitor and correct oral holding of PO with Min verbal cues  SLP Short Term Goal 3 (Week 1): Patient will sustain attention to functional, familiar tasks for 5-8 minutes with Min verbal cues for redirection  SLP Short Term Goal 4 (Week 1): Patient will utilize speech intelligibility and word finding strategeis during structured phrase-sentence level communication tasks with Min verbal cues  SLP Short Term Goal 5 (Week 1): Patient will utilize external aids to assist with recall of new, daily information with Min vebral cues  SLP Short Term Goal 6 (Week 1): Patient will solve problems during completion of familiar tasks with Mod verbal cues to self-minitor and correct errors  Skilled Therapeutic Interventions: Pt able to attend to functional task for 20 minutes at mod I level. Pt recognized new visual deficits from the stroke are limiting her vision for functional reading of her schedule. Once the pt's schedule was rewritten in larger font, the pt was able to read  her schedule with 100% acc. Discussed using enlarged text as a compensatory strategy since reading glasses are no longer adequate for typical professional font size. Pt verbalized agreement.   Function:  Eating Eating   Modified Consistency Diet: Yes Eating Assist Level: Supervision or verbal cues;Set up assist for   Eating Set Up Assist For: Opening containers       Cognition Comprehension Comprehension assist level: Follows basic  conversation/direction with extra time/assistive device  Expression   Expression assist level: Expresses basic 75 - 89% of the time/requires cueing 10 - 24% of the time. Needs helper to occlude trach/needs to repeat words.  Social Interaction Social Interaction assist level: Interacts appropriately 75 - 89% of the time - Needs redirection for appropriate language or to initiate interaction.  Problem Solving Problem solving assist level: Solves basic 75 - 89% of the time/requires cueing 10 - 24% of the time  Memory Memory assist level: Recognizes or recalls 75 - 89% of the time/requires cueing 10 - 24% of the time    Pain Pain Assessment Pain Assessment: No/denies pain  Therapy/Group: Individual Therapy  Vinetta Bergamo MA, CCC-SLP 05/14/2016, 9:17 AM

## 2016-05-14 NOTE — Plan of Care (Signed)
Problem: RH BOWEL ELIMINATION Goal: RH STG MANAGE BOWEL WITH ASSISTANCE STG Manage Bowel with min Assistance.  Outcome: Not Progressing Incontinent episodes

## 2016-05-14 NOTE — Progress Notes (Signed)
ANTICOAGULATION CONSULT NOTE - Follow Up Consult  Pharmacy Consult for couamdin Indication: MVR  Allergies  Allergen Reactions  . Codeine Hives, Itching and Other (See Comments)    "breathing problems"    Patient Measurements: Weight: 228 lb (103.42 kg) Heparin Dosing Weight:   Vital Signs: Temp: 98.2 F (36.8 C) (06/22 0611) Temp Source: Oral (06/22 0611) BP: 110/56 mmHg (06/22 0611) Pulse Rate: 68 (06/22 0611)  Labs:  Recent Labs  05/12/16 0420 05/13/16 0641 05/14/16 0517  HGB  --  10.9*  --   HCT  --  38.3  --   PLT  --  738*  --   LABPROT 22.4* 29.2* 31.9*  INR 1.99* 2.82* 3.17*  CREATININE  --  0.92  --     Estimated Creatinine Clearance: 77.6 mL/min (by C-G formula based on Cr of 0.92).   Medications:  Scheduled:  . amiodarone  400 mg Oral BID  . aspirin EC  81 mg Oral Daily  . atenolol  12.5 mg Oral BID  . clonazePAM  0.5 mg Oral QHS  . docusate sodium  200 mg Oral Daily  . escitalopram  10 mg Oral QHS  . famotidine  20 mg Oral BID  . furosemide  80 mg Oral Daily  . insulin aspart  0-20 Units Subcutaneous TID WC  . insulin detemir  28 Units Subcutaneous Daily  . nystatin   Topical BID  . spironolactone  50 mg Oral Daily  . traZODone  100 mg Oral QHS  . 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  INR today is 3.17.  Goal of Therapy:  INR 2.5-3.5 Monitor platelets by anticoagulation protocol: Yes   Plan:  Coumadin 5 mg po x1 Watch DI with amiodarone INR in am  Margaret [name], [name] 05/14/2016,8:19 AM

## 2016-05-15 ENCOUNTER — Inpatient Hospital Stay (HOSPITAL_COMMUNITY): Payer: Medicaid Other

## 2016-05-15 ENCOUNTER — Inpatient Hospital Stay (HOSPITAL_COMMUNITY): Payer: Self-pay | Admitting: Speech Pathology

## 2016-05-15 ENCOUNTER — Inpatient Hospital Stay (HOSPITAL_COMMUNITY): Payer: Self-pay | Admitting: Occupational Therapy

## 2016-05-15 ENCOUNTER — Inpatient Hospital Stay (HOSPITAL_COMMUNITY): Payer: Self-pay | Admitting: Physical Therapy

## 2016-05-15 DIAGNOSIS — I69321 Dysphasia following cerebral infarction: Secondary | ICD-10-CM | POA: Insufficient documentation

## 2016-05-15 DIAGNOSIS — I69359 Hemiplegia and hemiparesis following cerebral infarction affecting unspecified side: Secondary | ICD-10-CM

## 2016-05-15 DIAGNOSIS — I69959 Hemiplegia and hemiparesis following unspecified cerebrovascular disease affecting unspecified side: Secondary | ICD-10-CM | POA: Insufficient documentation

## 2016-05-15 DIAGNOSIS — I69922 Dysarthria following unspecified cerebrovascular disease: Secondary | ICD-10-CM | POA: Insufficient documentation

## 2016-05-15 LAB — PROTIME-INR
INR: 2.95 — ABNORMAL HIGH (ref 0.00–1.49)
Prothrombin Time: 30.3 seconds — ABNORMAL HIGH (ref 11.6–15.2)

## 2016-05-15 LAB — CBC WITH DIFFERENTIAL/PLATELET
BASOS ABS: 0 10*3/uL (ref 0.0–0.1)
BASOS PCT: 0 %
EOS ABS: 0.4 10*3/uL (ref 0.0–0.7)
Eosinophils Relative: 4 %
HCT: 36.2 % (ref 36.0–46.0)
HEMOGLOBIN: 10.5 g/dL — AB (ref 12.0–15.0)
LYMPHS ABS: 2.3 10*3/uL (ref 0.7–4.0)
Lymphocytes Relative: 19 %
MCH: 25.5 pg — ABNORMAL LOW (ref 26.0–34.0)
MCHC: 29 g/dL — AB (ref 30.0–36.0)
MCV: 87.9 fL (ref 78.0–100.0)
MONO ABS: 1.1 10*3/uL — AB (ref 0.1–1.0)
MONOS PCT: 10 %
NEUTROS PCT: 67 %
Neutro Abs: 7.8 10*3/uL — ABNORMAL HIGH (ref 1.7–7.7)
Platelets: 685 10*3/uL — ABNORMAL HIGH (ref 150–400)
RBC: 4.12 MIL/uL (ref 3.87–5.11)
RDW: 17.3 % — AB (ref 11.5–15.5)
WBC: 11.6 10*3/uL — ABNORMAL HIGH (ref 4.0–10.5)

## 2016-05-15 LAB — BASIC METABOLIC PANEL
Anion gap: 8 (ref 5–15)
BUN: 21 mg/dL — AB (ref 6–20)
CHLORIDE: 90 mmol/L — AB (ref 101–111)
CO2: 38 mmol/L — AB (ref 22–32)
CREATININE: 1.1 mg/dL — AB (ref 0.44–1.00)
Calcium: 10.9 mg/dL — ABNORMAL HIGH (ref 8.9–10.3)
GFR calc Af Amer: >60 mL/min (ref >60)
GFR calc non Af Amer: 53 mL/min — ABNORMAL LOW (ref >60)
Glucose, Bld: 111 mg/dL — ABNORMAL HIGH (ref 65–99)
Potassium: 4 mmol/L (ref 3.5–5.1)
Sodium: 136 mmol/L (ref 135–145)

## 2016-05-15 LAB — URINE CULTURE

## 2016-05-15 LAB — GLUCOSE, CAPILLARY
GLUCOSE-CAPILLARY: 111 mg/dL — AB (ref 65–99)
GLUCOSE-CAPILLARY: 146 mg/dL — AB (ref 65–99)
Glucose-Capillary: 142 mg/dL — ABNORMAL HIGH (ref 65–99)

## 2016-05-15 MED ORDER — WARFARIN SODIUM 5 MG PO TABS
5.0000 mg | ORAL_TABLET | Freq: Once | ORAL | Status: AC
  Administered 2016-05-15: 5 mg via ORAL
  Filled 2016-05-15: qty 1

## 2016-05-15 MED ORDER — LIDOCAINE HCL 2 % EX GEL
1.0000 "application " | Freq: Once | CUTANEOUS | Status: AC
  Administered 2016-05-15: 1
  Filled 2016-05-15: qty 5

## 2016-05-15 MED ORDER — MAGNESIUM CITRATE PO SOLN
1.0000 | Freq: Once | ORAL | Status: AC
  Administered 2016-05-15: 1 via ORAL
  Filled 2016-05-15: qty 296

## 2016-05-15 NOTE — Plan of Care (Signed)
Problem: RH BOWEL ELIMINATION Goal: RH STG MANAGE BOWEL WITH ASSISTANCE STG Manage Bowel with min Assistance.  Outcome: Not Progressing Having multiple smears but no true BM.  Refusing suppository or enema.

## 2016-05-15 NOTE — Progress Notes (Signed)
Physical Therapy Session Note  Patient Details  Name: Margaret [name] MRN: [id number] Date of Birth: 06/02/1956  Today's Date: 05/15/2016 PT Individual Time: 1302-1408 AND 1535-1559 PT Individual Time Calculation (min): 66 min 24 min   Short Term Goals: Week 1:  PT Short Term Goal 1 (Week 1): Patient will perform bed mobility consistent ly with mod A  PT Short Term Goal 2 (Week 1): Patient will propell WC 170ft in controlled environment with min A.  PT Short Term Goal 3 (Week 1): Patient will ambulate 1ft with LRAD and max A from PT.  PT Short Term Goal 4 (Week 1): Patient will perform stedy transfers with mod A.   Skilled Therapeutic Interventions/Progress Updates:   Session 1    Patient received supine in bed and agreeable to PT.  PT assisted patient to don pants with max A with Roll L and R technique. PT required to initiate roll at trunk to R with cues and manual facilitation at shoulder and pelvis.   Patient instructed in supine to sit with max A from PT and improved trunk positioning and BLE management, as well as mod cues for improved use of BLE and increased use of R UE to push to sitting position in side lying.   Patient performed stand pivot transfer to and from bed with max A x 4 in bilateral directions. PT required to block L knee and manually facilitate placement of LLE for all transfers. Max cues required for improved motivation, encouragement and UE and LE positioning.   PT instructed patient in car transfer with max A stand pivot technique. Max cues for improved Weight shifting and LE positioning to prevent lateral L LOB and decrease fear of transfer. PT also required to assist L LE into car and proper positioning prior to transfer out of car.   Patient returned to room and left in [facility] for trade off to SLP.   Session 2   Patient received sitting on toilet with RN and NT following attempt for bowel movement. PT instructed patient and RN for improved stedy transfer  technique for standard toilet seat height with maxA +2 transfer. Patient instructed to only use R UE to pull provided that there is no pain in the chest to maintain sternal precautions.  Patient transported to bed following toilet transfer in stedy and performed sit<>stand in stedy with Max A to sit on EOB. Sit>supine performed with Max A +2 due to pain in perineal area 2/2 unsuccessful bowel movement.  PT instructed patient in bed mobility with roll L and R with mod A to L and Max A to R to improve positioning and decrease pain in perineal area. Mod-max cues required for improved sequencing and use of LLE to initiate pelvic rotation in roll.   Patient reports no decrease in pain, and RN reports that patient will be taken for abdominal xray. Patient left with call bell within reach.     Therapy Documentation Precautions:  Precautions Precautions: Fall, Sternal Precaution Comments: L hemiparesis; L neglect; L subluxed shoulder Required Braces or Orthoses: Other Brace/Splint (left shoulder taped) Restrictions Weight Bearing Restrictions: Yes Other Position/Activity Restrictions: sternal precautions General:   missed 11 minutes.  Vital Signs: Therapy Vitals Temp: 97.7 F (36.5 C) Temp Source: Oral Pulse Rate: (!) 50 Resp: 20 BP: (!) 107/47 mmHg Patient Position (if appropriate): Sitting Oxygen Therapy SpO2: 95 % O2 Device: Nasal Cannula O2 Flow Rate (L/min): 2 L/min Pain: Pain Assessment Pain Assessment: 10/10 in perineal region after  2nd treatment.    See Function Navigator for Current Functional Status.   Therapy/Group: Individual Therapy  Lorie Phenix 05/15/2016, 5:31 PM

## 2016-05-15 NOTE — Progress Notes (Signed)
[place] PHYSICAL MEDICINE & REHABILITATION     PROGRESS NOTE  Subjective/Complaints:  Patient seen sitting up in bed this morning eating breakfast. She is more alert and interactive today. She appears to be in good mood. She states she slept well overnight.  ROS: Denies CP, SOB, nausea, vomiting, diarrhea.  Objective: Vital Signs: Blood pressure 108/51, pulse 55, temperature 98.3 F (36.8 C), temperature source Oral, resp. rate 18, weight 103.1 kg (227 lb 4.7 oz), SpO2 100 %. Dg Chest Port 1 View  05/13/2016  CLINICAL DATA:  Leukocytosis, hypertension EXAM: PORTABLE CHEST 1 VIEW COMPARISON:  05/11/2016 FINDINGS: Cardiomegaly again noted. Status post CABG. Mild interstitial prominence bilaterally right greater than left suspicious for mild interstitial edema. Mild right basilar atelectasis or infiltrate with slight improvement in aeration. IMPRESSION: Status post CABG. Mild interstitial prominence bilaterally right greater than left suspicious for mild interstitial edema. Mild right basilar atelectasis or infiltrate with slight improvement in aeration. Electronically Signed   By: Lahoma Crocker M.D.   On: 05/13/2016 10:03    Recent Labs  05/13/16 0641 05/15/16 0550  WBC 16.0* 11.6*  HGB 10.9* 10.5*  HCT 38.3 36.2  PLT 738* 685*    Recent Labs  05/13/16 0641 05/15/16 0550  NA 135 136  K 4.5 4.0  CL 95* 90*  GLUCOSE 123* 111*  BUN 17 21*  CREATININE 0.92 1.10*  CALCIUM 11.8* 10.9*   CBG (last 3)   Recent Labs  05/14/16 1630 05/14/16 2126 05/15/16 0643  GLUCAP 143* 140* 111*    Wt Readings from Last 3 Encounters:  05/15/16 103.1 kg (227 lb 4.7 oz)  05/12/16 105.008 kg (231 lb 8 oz)  04/14/16 117.935 kg (260 lb)    Physical Exam:  BP 108/51 mmHg  Pulse 55  Temp(Src) 98.3 F (36.8 C) (Oral)  Resp 18  Wt 103.1 kg (227 lb 4.7 oz)  SpO2 100% Constitutional: She appears well-developed. Obese  HENT: Normocephalic and atraumatic.  Eyes: EOM are normal. Right eye  exhibits no discharge. Left eye exhibits no discharge.  Cardiovascular: Regular Rate and rhythm. Respiratory: No respiratory distress. Effort normal.  GI: Soft. Bowel sounds are normal.  Musculoskeletal: She exhibits edema. She exhibits no tenderness.  Neurological: She is alert. A cranial nerve deficit is present.  Left facial weakness  Severe dysarthria but intelligible.  Follows simple commands. Has reasonable insight and awareness. Memory fair. Delayed problem solving RUE/RLE: 4+/5 proximal to distal LUE 0/5 proximal to distal.  LLE: 2/5 proximal to distal. No increase in tone noted Skin: Chest incision clean dry and dressed. Vascular changes bilateral lower extremities  Psychiatric: Her affect is blunt and flat. Her speech is delayed. She is slowed. (All improved today.)   Assessment/Plan: 1. 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 Physiatrist is providing close team supervision and 24 hour management of active medical problems listed below. Physiatrist and rehab team continue to assess barriers to discharge/monitor patient progress toward functional and medical goals.  Function:  Bathing Bathing position   Position: Shower  Bathing parts Body parts bathed by patient: Chest, Abdomen, Front perineal area, Right upper leg, Left upper leg Body parts bathed by helper: Left lower leg, Right lower leg, Back, Buttocks, Left arm, Right arm  Bathing assist Assist Level: Touching or steadying assistance(Pt > 75%)  Upper Body Dressing/Undressing Upper body dressing   What is the patient wearing?: Pull over shirt/dress                Upper body assist Assist Level: Touching or steadying assistance(Pt > 75%)      Lower Body  Dressing/Undressing Lower body dressing   What is the patient wearing?: Socks, Pants (brief)       Pants- Performed by helper: Thread/unthread right pants leg, Thread/unthread left pants leg, Pull pants up/down       Socks - Performed by helper: Don/doff right sock, Don/doff left sock              Lower body assist Assist for lower body dressing: Touching or steadying assistance (Pt > 75%)      Toileting Toileting Toileting activity did not occur: No continent bowel/bladder event   Toileting steps completed by helper: Adjust clothing prior to toileting, Performs perineal hygiene, Adjust clothing after toileting    Toileting assist Assist level: Touching or steadying assistance (Pt.75%)   Transfers Chair/bed transfer   Chair/bed transfer method: Stand pivot Chair/bed transfer assist level: Maximal assist (Pt 25 - 49%/lift and lower) Chair/bed transfer assistive device: Bedrails, Armrests     Locomotion Ambulation     Max distance: 15ft Assist level: Maximal assist (Pt 25 - 49%)   Wheelchair   Type: Manual Max wheelchair distance: 27ft Assist Level: Moderate assistance (Pt 50 - 74%)  Cognition Comprehension Comprehension assist level: Follows basic conversation/direction with extra time/assistive device  Expression Expression assist level: Expresses basic 75 - 89% of the time/requires cueing 10 - 24% of the time. Needs helper to occlude trach/needs to repeat words.  Social Interaction Social Interaction assist level: Interacts appropriately 75 - 89% of the time - Needs redirection for appropriate language or to initiate interaction.  Problem Solving Problem solving assist level: Solves basic 75 - 89% of the time/requires cueing 10 - 24% of the time  Memory Memory assist level: Recognizes or recalls 75 - 89% of the time/requires cueing 10 - 24% of the time    Medical Problem List and Plan: 1. Debilitation secondary to mitral valve repair 04/30/2016 as well as recent  history of subarachnoid hemorrhage with left-sided residual weakness and dysarthria. Sternal precautions after mitral valve repair  Continue CIR  PRAFO, hand splints ordered 2. DVT Prophylaxis/Anticoagulation: Coumadin therapy after mitral valve repair  INR Therapeutic today. Appreciate pharmacy recs. 3. Pain Management: Oxycodone as needed 4. Mood/panic attacks: Lexapro 10 mg daily at bedtime, trazodone 100 mg daily at bedtime, Klonopin 0.5 mg daily at bedtime and 3 times a day as needed 5. Neuropsych: This patient is not fully capable of making decisions on her own behalf. 6. Skin/Wound Care: Routine skin checks 7. Fluids/Electrolytes/Nutrition: Routine I&O  8. Dysphagia. Dysphagia #3 thin liquids. Follow speech therapy 9. PAF with RVR. Continue amiodarone, Aldactone 50 mg daily, Tenormin 12.5 mg twice a day. Follow-up cardiology services 10. Acute on chronic diastolic congestive heart failure.   Lasix 80 mg daily, increased to 60 BID on 6/22 .  daily weights  Monitor for any signs of fluid overload Filed Weights   05/13/16 0631 05/14/16 0500 05/15/16 0533  Weight: 104.191 kg (229 lb 11.2 oz) 103.42 kg (228 lb) 103.1 kg (227 lb 4.7 oz)  11. Diabetes mellitus with peripheral neuropathy. Check blood sugars before meals and at bedtime. Continue Levemir 28 units daily. Diabetic teaching  Fairly controlled at present 92. Leukocytosis: Trending down  Currently afebrile  WBCs 11.6 on 6/23  UA negative, U culture pending  Chest x-ray from 6/21 showing interstitial edema and? Infiltrate/atelectasis (appears to have slightly improved from previous x-ray) 13. AKI  Creatinine 1.10 on 6/23  Encourage fluid intake  LOS (Days) 3 A FACE TO FACE EVALUATION WAS PERFORMED  Ankit Lorie Phenix 05/15/2016 9:10 AM

## 2016-05-15 NOTE — Progress Notes (Signed)
Occupational Therapy Session Note  Patient Details  Name: Margaret [name] MRN: [id number] Date of Birth: 12-Jan-1956  Today's Date: 05/15/2016 OT Individual Time: 0900-1000 OT Individual Time Calculation (min): 60 min    Short Term Goals: Week 1:  OT Short Term Goal 1 (Week 1): Pt will be mod I with bed mobility with HOB down as a precursor for ADLs  OT Short Term Goal 2 (Week 1): Pt will complete UB bathing and dressing seated EOB unsupported with moderate assistance  OT Short Term Goal 3 (Week 1): Pt will perform toilet transfer with mod assist  OT Short Term Goal 4 (Week 1): Pt will independently verbalize and adhere to sternal precautions 100% of the time  Skilled Therapeutic Interventions/Progress Updates:    1:1 self care retraining at EOB. Focus on bed mobiltiy with mod A with bed rail to come to EOB. Pt able to maintain sitting balance on EOB with distant supervision to mod I.  Addressed hemi techniques for bathing and dressing UB with mod instinctual cues and extra time for processing. Pt sit to stand from slightly elevated surface with mod A with STEDY for peri hygiene and donning a clean breif- however pt with c/o discomfort so 2nd time standing pt able to come sit to stand without STEDY from EOB with mod A with support at left knee from therapist. Pt performed stand pivot towards the left with mod A with extra time and mod VC. Pt able to tolerate being on 1 liter of O2 maintaining sats >88 with activity with one cue for pursued lip breathing. Pt able to perform grooming at sink with setup and more than reasonable amt of time.   Therapy Documentation Precautions:  Precautions Precautions: Fall, Sternal Precaution Comments: L hemiparesis; L neglect; L subluxed shoulder Required Braces or Orthoses: Other Brace/Splint (left shoulder taped) Restrictions Weight Bearing Restrictions: Yes Other Position/Activity Restrictions: sternal precautions General:   Vital  Signs:  Pain: Pain Assessment Pain Assessment: No/denies pain Pain Score: 0-No pain  See Function Navigator for Current Functional Status.   Therapy/Group: Individual Therapy  Willeen Cass [facility] 05/15/2016, 9:49 AM

## 2016-05-15 NOTE — Progress Notes (Signed)
KUB results show stool impaction.  Large amount of hard sticky stool digitally removed from rectum.  Patient tolerated fair, husband at bedside for support.  Soft stool still noted to be further up in rectum.  Patient now resting in bed.  Brita Romp, RN

## 2016-05-15 NOTE — Progress Notes (Addendum)
ANTICOAGULATION CONSULT NOTE - Follow Up Consult  Pharmacy Consult for coumadin Indication: MVR  Allergies  Allergen Reactions  . Codeine Hives, Itching and Other (See Comments)    "breathing problems"    Patient Measurements: Weight: 227 lb 4.7 oz (103.1 kg) Heparin Dosing Weight:   Vital Signs: Temp: 98.3 F (36.8 C) (06/23 0533) Temp Source: Oral (06/23 0533) BP: 108/51 mmHg (06/23 0533) Pulse Rate: 55 (06/23 0533)  Labs:  Recent Labs  05/13/16 0641 05/14/16 0517 05/15/16 0550  HGB 10.9*  --  10.5*  HCT 38.3  --  36.2  PLT 738*  --  685*  LABPROT 29.2* 31.9* 30.3*  INR 2.82* 3.17* 2.95*  CREATININE 0.92  --  1.10*    Estimated Creatinine Clearance: 64.7 mL/min (by C-G formula based on Cr of 1.1).   Medications:  Scheduled:  . amiodarone  400 mg Oral BID  . aspirin EC  81 mg Oral Daily  . atenolol  12.5 mg Oral BID  . clonazePAM  0.5 mg Oral QHS  . docusate sodium  200 mg Oral Daily  . escitalopram  10 mg Oral QHS  . famotidine  20 mg Oral BID  . feeding supplement (ENSURE ENLIVE)  237 mL Oral BID BM  . furosemide  60 mg Oral BID  . insulin aspart  0-20 Units Subcutaneous TID WC  . insulin detemir  28 Units Subcutaneous Daily  . nystatin   Topical BID  . spironolactone  50 mg Oral Daily  . traZODone  100 mg Oral QHS  . 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  INR today is 2.95.  Goal of Therapy:  INR 2.5-3.5 Monitor platelets by anticoagulation protocol: Yes   Plan:  Coumadin 5 mg po x1 Watch DI with amiodarone Warf edu done 6/20 Daily PT/INR  Margaret [name], Margaret [name] 05/15/2016,8:31 AM

## 2016-05-15 NOTE — Progress Notes (Signed)
Patient complaining of severe rectal pain.  Assisted patient to toilet via stedy.  Patient unable to pass stool.  Patient refused to have RN check for impaction as someone had previously done so days ago and caused "the worst pain I have ever felt."  Notified Marlowe Shores, PA of findings, initially ordered magnesium citrate to be given but due to persistent severe rectal pain, ordered stat KUB.  Patient assisted back to bed.  Husband at the bedside.  Will continue to monitor.  Brita Romp, RN

## 2016-05-15 NOTE — Progress Notes (Signed)
Speech Language Pathology Daily Session Note  Patient Details  Name: Margaret [name] MRN: [id number] Date of Birth: November 21, 1956  Today's Date: 05/15/2016 SLP Individual Time: [id number]; 24 minutes    Short Term Goals: Week 1: SLP Short Term Goal 1 (Week 1): Patient will demonstrate effecient mastication and oral clearance of regular textures with Min verbal cues, over two sessions to demo readiness for upgrade SLP Short Term Goal 2 (Week 1): Patient will self-monitor and correct oral holding of PO with Min verbal cues  SLP Short Term Goal 3 (Week 1): Patient will sustain attention to functional, familiar tasks for 5-8 minutes with Min verbal cues for redirection  SLP Short Term Goal 4 (Week 1): Patient will utilize speech intelligibility and word finding strategeis during structured phrase-sentence level communication tasks with Min verbal cues  SLP Short Term Goal 5 (Week 1): Patient will utilize external aids to assist with recall of new, daily information with Min vebral cues  SLP Short Term Goal 6 (Week 1): Patient will solve problems during completion of familiar tasks with Mod verbal cues to self-minitor and correct errors  Skilled Therapeutic Interventions:  Pt was seen for skilled ST targeting goals for cognition and dysphagia.  Therapist facilitated the session with trials of regular textures to continue working towards texture progression.  Pt consumed advanced textures without difficulty and with minimal residual solids remaining in the oral cavity post swallow.  No overt s/s of aspiration evident with thin liquids or with solids.  Pt with complaints regarding recommendations for full supervision.  Given that pt was mod I for use of swallowing precautions after set up assist, recommend advancing pt to intermittent supervision.  Pt required min assist question cues to problem solve use of call bell to ask for assistance with tray set up given hemiparesis which impacts her ability to set  up independently.  Therapist reviewed and reinforced use of call bell given pt's report "I thought you only used that in emergencies."  Pt verbalized understanding.  Pt was left in wheelchair with call bell within reach.  Continue per current plan of care.    Function:  Eating Eating   Modified Consistency Diet: Yes Eating Assist Level: Supervision or verbal cues;Set up assist for   Eating Set Up Assist For: Opening containers       Cognition Comprehension Comprehension assist level: Follows basic conversation/direction with extra time/assistive device  Expression   Expression assist level: Expresses basic 75 - 89% of the time/requires cueing 10 - 24% of the time. Needs helper to occlude trach/needs to repeat words.  Social Interaction Social Interaction assist level: Interacts appropriately 75 - 89% of the time - Needs redirection for appropriate language or to initiate interaction.  Problem Solving Problem solving assist level: Solves basic 75 - 89% of the time/requires cueing 10 - 24% of the time  Memory Memory assist level: Recognizes or recalls 75 - 89% of the time/requires cueing 10 - 24% of the time    Pain Pain Assessment Pain Assessment: No/denies pain  Therapy/Group: Individual Therapy  Juwaun Inskeep, Selinda Orion 05/15/2016, 4:23 PM

## 2016-05-16 ENCOUNTER — Inpatient Hospital Stay (HOSPITAL_COMMUNITY): Payer: Medicaid Other | Admitting: Speech Pathology

## 2016-05-16 ENCOUNTER — Inpatient Hospital Stay (HOSPITAL_COMMUNITY): Payer: Self-pay | Admitting: Physical Therapy

## 2016-05-16 ENCOUNTER — Inpatient Hospital Stay (HOSPITAL_COMMUNITY): Payer: Medicaid Other | Admitting: Occupational Therapy

## 2016-05-16 DIAGNOSIS — K5901 Slow transit constipation: Secondary | ICD-10-CM

## 2016-05-16 LAB — GLUCOSE, CAPILLARY
GLUCOSE-CAPILLARY: 91 mg/dL (ref 65–99)
GLUCOSE-CAPILLARY: 97 mg/dL (ref 65–99)
GLUCOSE-CAPILLARY: 174 mg/dL — AB (ref 65–99)
GLUCOSE-CAPILLARY: 193 mg/dL — AB (ref 65–99)
Glucose-Capillary: 112 mg/dL — ABNORMAL HIGH (ref 65–99)

## 2016-05-16 LAB — PROTIME-INR
INR: 3.43 — ABNORMAL HIGH (ref 0.00–1.49)
Prothrombin Time: 33.8 seconds — ABNORMAL HIGH (ref 11.6–15.2)

## 2016-05-16 MED ORDER — SENNOSIDES-DOCUSATE SODIUM 8.6-50 MG PO TABS
2.0000 | ORAL_TABLET | Freq: Two times a day (BID) | ORAL | Status: DC
  Administered 2016-05-16 – 2016-05-24 (×16): 2 via ORAL
  Administered 2016-05-24: 1 via ORAL
  Administered 2016-05-25 – 2016-06-04 (×21): 2 via ORAL
  Filled 2016-05-16 (×39): qty 2

## 2016-05-16 MED ORDER — POLYETHYLENE GLYCOL 3350 17 G PO PACK: 17.0000 g | PACK | Freq: Every day | ORAL | Status: DC | PRN

## 2016-05-16 MED ORDER — WARFARIN SODIUM 2.5 MG PO TABS
2.5000 mg | ORAL_TABLET | Freq: Once | ORAL | Status: AC
  Administered 2016-05-16: 2.5 mg via ORAL
  Filled 2016-05-16: qty 1

## 2016-05-16 NOTE — Progress Notes (Addendum)
Physical Therapy Session Note  Patient Details  Name: Margaret [name] MRN: [id number] Date of Birth: 1956-07-05  Today's Date: 05/16/2016  PT Individual Time: 1300-1400 AND 1632-1700 PT Individual Time Calculation (min): 60 min  AND 28 min  Short Term Goals: Week 1:  PT Short Term Goal 1 (Week 1): Patient will perform bed mobility consistent ly with mod A  PT Short Term Goal 2 (Week 1): Patient will propell WC 133ft in controlled environment with min A.  PT Short Term Goal 3 (Week 1): Patient will ambulate 51ft with LRAD and max A from PT.  PT Short Term Goal 4 (Week 1): Patient will perform stedy transfers with mod A.   Skilled Therapeutic Interventions/Progress Updates:    Session 1: Patient received sitting in WC and agreeable to PT WC mobility without Supplemental O2 for 70ft, and 50ft with L hemi technique with supervision A for straight trajectory and min A for doorway management. Patient reports increased SOB with SpO2 of 87%. Pt applied O2 via nasal canula and patient's SpO2 increased to 94% in 30 seconds.  PT instructed patient in Transfer training without AFO to L max A from PT for balance and LLE positioning as well a  to block L knee to prevent buckle and hyper extension Transfer training performed with AFO to L with Max  A from PT for improved balance and LLE placement; PT not required to block knee for transfer. Patient instructed in scooting EOB with min A and min cues for improved use of BLE and RUE.  Transfer training with AFO and Hemi walker Max A. Patient was able to stabilize in standing with decreased assist from PT compared to without AD.    Following transfer with Hemiwalker, patient noted to have Bloody nose. Patient returned to room in [facility] and RN notified and assessed patient. Mild chest pain reported at end of PT session with deep breaths; RN made aware.   Patient left sitting in [facility] with call bell within reach.   Session 2.  Transfer training with AFO and hemi  walker with Max A for stability and LLE position from PT and mod cues for improved sit<>stand technique and sequencing. Patient was able to take small posterior step with R LE to transfer to bed from [facility]  Sit>supine through side lying and roll to back. Patient noted to have Vertigo episode with R beating nystagmus following roll from L side to back.PT provided Education for patient on the cause of vertigo and potential treatment  Patient left in bed with NT present.   Therapy Documentation Precautions:  Precautions Precautions: Fall, Sternal Precaution Comments: L hemiparesis; L neglect; L subluxed shoulder Required Braces or Orthoses: Other Brace/Splint (left shoulder taped) Restrictions Weight Bearing Restrictions: Yes Other Position/Activity Restrictions: sternal precautions General:   Vital Signs: Therapy Vitals Temp: 98.6 F (37 C) Temp Source: Oral Pulse Rate: (!) 45 Resp: 18 BP: (!) 116/56 mmHg Patient Position (if appropriate): Sitting Oxygen Therapy SpO2: 99 % O2 Device: Nasal Cannula O2 Flow Rate (L/min): 1 L/min     See Function Navigator for Current Functional Status.   Therapy/Group: Individual Therapy  Lorie Phenix 05/16/2016, 5:09 PM

## 2016-05-16 NOTE — Progress Notes (Signed)
Speech Language Pathology Daily Session Note  Patient Details  Name: NATYLEE WALLOCK MRN: [id number] Date of Birth: [birth year]  Today's Date: 05/16/2016 SLP Individual Time: [id number] SLP Individual Time Calculation (min): 45 min  Short Term Goals: Week 1: SLP Short Term Goal 1 (Week 1): Patient will demonstrate effecient mastication and oral clearance of regular textures with Min verbal cues, over two sessions to demo readiness for upgrade SLP Short Term Goal 2 (Week 1): Patient will self-monitor and correct oral holding of PO with Min verbal cues  SLP Short Term Goal 3 (Week 1): Patient will sustain attention to functional, familiar tasks for 5-8 minutes with Min verbal cues for redirection  SLP Short Term Goal 4 (Week 1): Patient will utilize speech intelligibility and word finding strategeis during structured phrase-sentence level communication tasks with Min verbal cues  SLP Short Term Goal 5 (Week 1): Patient will utilize external aids to assist with recall of new, daily information with Min vebral cues  SLP Short Term Goal 6 (Week 1): Patient will solve problems during completion of familiar tasks with Mod verbal cues to self-minitor and correct errors  Skilled Therapeutic Interventions: Pt able to attend to functional task for 20 minutes at mod I level when distractions removed. Pt compensating for new visual deficits from the stroke by requesting larger font for her schedule. Able to count coins with 100% acc mod I. Pt able to verbally sequence functional activity WFL.  Pt able to manage regular texture trial at mod I .    Function:  Eating Eating   Modified Consistency Diet: Yes Eating Assist Level: Swallowing techniques: self managed;Set up assist for   Eating Set Up Assist For: Opening containers       Cognition Comprehension Comprehension assist level: Follows basic conversation/direction with extra time/assistive device  Expression   Expression assist level:  Expresses basic 75 - 89% of the time/requires cueing 10 - 24% of the time. Needs helper to occlude trach/needs to repeat words.  Social Interaction Social Interaction assist level: Interacts appropriately 75 - 89% of the time - Needs redirection for appropriate language or to initiate interaction.  Problem Solving Problem solving assist level: Solves basic 75 - 89% of the time/requires cueing 10 - 24% of the time  Memory Memory assist level: Recognizes or recalls 75 - 89% of the time/requires cueing 10 - 24% of the time    Pain Pain Assessment Pain Assessment: No/denies pain  Therapy/Group: Individual Therapy  Vinetta Bergamo MA, CCC-SLP 05/16/2016, 9:29 AM

## 2016-05-16 NOTE — Progress Notes (Addendum)
Occupational Therapy Session Note  Patient Details  Name: Margaret [name] MRN: [id number] Date of Birth: [birth year]  Today's Date: 05/16/2016 OT Individual Time:  - 1000-1100  (30min)      Short Term Goals: Week 1:  OT Short Term Goal 1 (Week 1): Pt will be mod I with bed mobility with HOB down as a precursor for ADLs  OT Short Term Goal 2 (Week 1): Pt will complete UB bathing and dressing seated EOB unsupported with moderate assistance  OT Short Term Goal 3 (Week 1): Pt will perform toilet transfer with mod assist  OT Short Term Goal 4 (Week 1): Pt will independently verbalize and adhere to sternal precautions 100% of the time         Skilled Therapeutic Interventions/Progress Updates:    Pt lying in bed.  Reported impaction and procedure.  Pt. Has very painful LUE at shoulder and needed max assist for rolling and coming to sitting.  Ppt sat on EOB and performed dressing using hemi techniques.  Pt repeated sternal precautions with mod ver bal cues.  Pt reported no pulling on Left shoulder due to pain.  She transferred from sit to stand +2and then did stand pivot to wc.  Left with all needs in reach.   Therapy Documentation Precautions:  Precautions Precautions: Fall, Sternal Precaution Comments: L hemiparesis; L neglect; L subluxed shoulder Required Braces or Orthoses: Other Brace/Splint (left shoulder taped) Restrictions Weight Bearing Restrictions: Yes Other Position/Activity Restrictions: sternal precautions      Pain: Pain Assessment Pain Assessment: No/denies pain Pain Score: 0-No pain         See Function Navigator for Current Functional Status.   Therapy/Group: Individual Therapy  Lisa Roca 05/16/2016, 10:20 AM

## 2016-05-16 NOTE — Progress Notes (Signed)
ANTICOAGULATION CONSULT NOTE - Follow Up Consult  Pharmacy Consult for coumadin Indication: MVR  Allergies  Allergen Reactions  . Codeine Hives, Itching and Other (See Comments)    "breathing problems"    Patient Measurements: Weight: 230 lb (104.327 kg) Heparin Dosing Weight:   Vital Signs: Temp: 98 F (36.7 C) (06/24 0502) Temp Source: Oral (06/24 0502) BP: 109/53 mmHg (06/24 0502) Pulse Rate: 50 (06/24 0502)  Labs:  Recent Labs  05/14/16 0517 05/15/16 0550 05/16/16 0625  HGB  --  10.5*  --   HCT  --  36.2  --   PLT  --  685*  --   LABPROT 31.9* 30.3* 33.8*  INR 3.17* 2.95* 3.43*  CREATININE  --  1.10*  --     Estimated Creatinine Clearance: 65.2 mL/min (by C-G formula based on Cr of 1.1).   Medications:  Scheduled:  . amiodarone  400 mg Oral BID  . aspirin EC  81 mg Oral Daily  . atenolol  12.5 mg Oral BID  . clonazePAM  0.5 mg Oral QHS  . docusate sodium  200 mg Oral Daily  . escitalopram  10 mg Oral QHS  . famotidine  20 mg Oral BID  . feeding supplement (ENSURE ENLIVE)  237 mL Oral BID BM  . furosemide  60 mg Oral BID  . insulin aspart  0-20 Units Subcutaneous TID WC  . insulin detemir  28 Units Subcutaneous Daily  . nystatin   Topical BID  . spironolactone  50 mg Oral Daily  . traZODone  100 mg Oral QHS  . warfarin  2.5 mg Oral ONCE-1800  . 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 INR remains therapeutic at 3.43 today. CBC stable. No overt bleeding noted.   DDI: PTA amio  Goal of Therapy:  INR 2.5-3.5 Monitor platelets by anticoagulation protocol: Yes   Plan:  Coumadin 2.5 mg po x1 Daily INR Monitor CBC, s/sx bleeding, DI with amiodarone  Stephens November, PharmD Clinical Pharmacy Resident 10:02 AM, 05/16/2016

## 2016-05-16 NOTE — Progress Notes (Signed)
[place] PHYSICAL MEDICINE & REHABILITATION     PROGRESS NOTE  Subjective/Complaints:  No issues overnight working on ADLs with occupational therapy Disc impacted last night discussed with nursing  ROS: Denies CP, SOB, nausea, vomiting, diarrhea.  Objective: Vital Signs: Blood pressure 109/53, pulse 50, temperature 98 F (36.7 C), temperature source Oral, resp. rate 18, weight 104.327 kg (230 lb), SpO2 95 %. Dg Abd 1 View  05/15/2016  CLINICAL DATA:  Rectal pain EXAM: ABDOMEN - 1 VIEW COMPARISON:  02/05/2016 FINDINGS: There is normal small bowel gas pattern. Moderate stool noted within rectum. The rectum measures 6.2 cm in diameter. Fecal impaction cannot be excluded. Some colonic stool noted within distal colon. IMPRESSION: Normal small bowel gas pattern. Moderate stool noted within rectum measures 6.2 cm in diameter. Fecal impaction cannot be excluded. Electronically Signed   By: Lahoma Crocker M.D.   On: 05/15/2016 17:41    Recent Labs  05/15/16 0550  WBC 11.6*  HGB 10.5*  HCT 36.2  PLT 685*    Recent Labs  05/15/16 0550  NA 136  K 4.0  CL 90*  GLUCOSE 111*  BUN 21*  CREATININE 1.10*  CALCIUM 10.9*   CBG (last 3)   Recent Labs  05/15/16 1632 05/15/16 2128 05/16/16 0654  GLUCAP 142* 91 97    Wt Readings from Last 3 Encounters:  05/16/16 104.327 kg (230 lb)  05/12/16 105.008 kg (231 lb 8 oz)  04/14/16 117.935 kg (260 lb)    Physical Exam:  BP 109/53 mmHg  Pulse 50  Temp(Src) 98 F (36.7 C) (Oral)  Resp 18  Wt 104.327 kg (230 lb)  SpO2 95% Constitutional: She appears well-developed. Obese  HENT: Normocephalic and atraumatic.  Eyes: EOM are normal. Right eye exhibits no discharge. Left eye exhibits no discharge.  Cardiovascular: Regular Rate and rhythm. Respiratory: No respiratory distress. Effort normal.  GI: Soft. Bowel sounds are normal.  Musculoskeletal: She exhibits edema. She exhibits no tenderness.  Neurological: She is alert. A cranial  nerve deficit is present.  Left facial weakness  Severe dysarthria but intelligible.  Follows simple commands. Has reasonable insight and awareness. Memory fair. Delayed problem solving RUE/RLE: 4+/5 proximal to distal LUE 0/5 proximal to distal.  LLE: 2/5 proximal to distal. No increase in tone noted Skin: Chest incision clean dry and dressed. Vascular changes bilateral lower extremities  Psychiatric: Her affect is blunt and flat. Her speech is delayed. She is slowed. (All improved today.)   Assessment/Plan: 1. 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 Physiatrist is providing close team supervision and 24 hour management of active medical problems listed below. Physiatrist and rehab team continue to assess barriers to discharge/monitor patient progress toward functional and medical goals.  Function:  Bathing Bathing position   Position: Shower  Bathing parts Body parts bathed by patient: Chest, Abdomen, Front perineal area, Right upper leg, Left upper leg Body parts bathed by helper: Left lower leg, Right lower leg, Back, Buttocks, Left arm, Right arm  Bathing assist Assist Level: Touching or steadying assistance(Pt > 75%) (patient completed 50%, moderate assist)      Upper Body Dressing/Undressing Upper body dressing   What is the patient wearing?: Bra, Pull over shirt/dress Bra - Perfomed by patient: Thread/unthread right bra strap, Thread/unthread left bra strap, Hook/unhook bra (pull down sports bra)  Pull over shirt/dress - Perfomed by patient: Thread/unthread right sleeve, Thread/unthread left sleeve, Put head through opening, Pull shirt over trunk          Upper body assist Assist Level: Touching or steadying assistance(Pt > 75%) (per OT note)       Lower Body Dressing/Undressing Lower body dressing   What is the patient wearing?: Socks, Pants (brief)       Pants- Performed by helper: Thread/unthread right pants leg, Thread/unthread left pants leg, Pull pants up/down       Socks - Performed by helper: Don/doff right sock, Don/doff left sock              Lower body assist Assist for lower body dressing:  (total assist)      Toileting Toileting Toileting activity did not occur: No continent bowel/bladder event   Toileting steps completed by helper: Adjust clothing prior to toileting, Performs perineal hygiene, Adjust clothing after toileting    Toileting assist Assist level: Two helpers   Transfers Chair/bed transfer   Chair/bed transfer method: Stand pivot Chair/bed transfer assist level: Maximal assist (Pt 25 - 49%/lift and lower) Chair/bed transfer assistive device: Bedrails, Armrests     Locomotion Ambulation     Max distance: 59ft Assist level: Maximal assist (Pt 25 - 49%)   Wheelchair   Type: Manual Max wheelchair distance: 59ft Assist Level: Supervision or verbal cues  Cognition Comprehension Comprehension assist level: Follows basic conversation/direction with extra time/assistive device  Expression Expression assist level: Expresses basic 75 - 89% of the time/requires cueing 10 - 24% of the time. Needs helper to occlude trach/needs to repeat words.  Social Interaction Social Interaction assist level: Interacts appropriately 75 - 89% of the time - Needs redirection for appropriate language or to initiate interaction.  Problem Solving Problem solving assist level: Solves basic 75 - 89% of the time/requires cueing 10 - 24% of the time  Memory Memory assist level: Recognizes or recalls 75 - 89% of the time/requires cueing 10 - 24% of the time    Medical Problem List and Plan: 1. Debilitation secondary to mitral valve repair 04/30/2016 as well as recent history of subarachnoid hemorrhage with  left-sided residual weakness and dysarthria. Sternal precautions after mitral valve repair  Continue CIR  PRAFO, hand splints ordered 2. DVT Prophylaxis/Anticoagulation: Coumadin therapy after mitral valve repair  INR Therapeutic today. Appreciate pharmacy recs. 3. Pain Management: Oxycodone as needed 4. Mood/panic attacks: Lexapro 10 mg daily at bedtime, trazodone 100 mg daily at bedtime, Klonopin 0.5 mg daily at bedtime and 3 times a day as needed 5. Neuropsych: This patient is not fully capable of making decisions on her own behalf. 6. Skin/Wound Care: Routine skin checks 7. Fluids/Electrolytes/Nutrition: Routine I&O  8. Dysphagia. Dysphagia #3 thin liquids. Follow speech therapy 9. PAF with RVR. Continue amiodarone, Aldactone 50 mg daily, Tenormin 12.5 mg twice a day. Follow-up cardiology services 10. Acute on chronic diastolic congestive heart failure.   Lasix 80 mg daily, increased to 60 BID on 6/22 .  daily weights  Monitor for any signs of fluid overload Filed Weights   05/14/16 0500 05/15/16 0533 05/16/16 0502  Weight: 103.42 kg (228 lb) 103.1 kg (227 lb 4.7 oz) 104.327 kg (230 lb)  11. Diabetes mellitus with peripheral neuropathy. Check blood sugars before meals and at bedtime. Continue Levemir 28 units daily. Diabetic teaching  Fairly controlled at present 46. Leukocytosis: Trending down  Currently afebrile  WBCs 11.6 on 6/23  UA negative, U culture  pending  Chest x-ray from 6/21 showing interstitial edema and? Infiltrate/atelectasis (appears to have slightly improved from previous x-ray) 13. AKI  Creatinine 1.10 on 6/23  Encourage fluid intake 14. Constipation,, adjust laxatives LOS (Days) 4 A FACE TO FACE EVALUATION WAS PERFORMED  Charlett Blake 05/16/2016 10:37 AM

## 2016-05-17 ENCOUNTER — Inpatient Hospital Stay (HOSPITAL_COMMUNITY): Payer: Medicaid Other | Admitting: Physical Therapy

## 2016-05-17 DIAGNOSIS — I480 Paroxysmal atrial fibrillation: Secondary | ICD-10-CM | POA: Insufficient documentation

## 2016-05-17 LAB — GLUCOSE, CAPILLARY
GLUCOSE-CAPILLARY: 223 mg/dL — AB (ref 65–99)
Glucose-Capillary: 119 mg/dL — ABNORMAL HIGH (ref 65–99)
Glucose-Capillary: 196 mg/dL — ABNORMAL HIGH (ref 65–99)
Glucose-Capillary: 151 mg/dL — ABNORMAL HIGH (ref 65–99)

## 2016-05-17 LAB — PROTIME-INR
INR: 3.47 — ABNORMAL HIGH (ref 0.00–1.49)
PROTHROMBIN TIME: 34.2 s — AB (ref 11.6–15.2)

## 2016-05-17 MED ORDER — AMIODARONE HCL 200 MG PO TABS
200.0000 mg | ORAL_TABLET | Freq: Every day | ORAL | Status: DC
  Administered 2016-05-18 – 2016-06-04 (×18): 200 mg via ORAL
  Filled 2016-05-17 (×18): qty 1

## 2016-05-17 MED ORDER — WARFARIN SODIUM 2.5 MG PO TABS
2.5000 mg | ORAL_TABLET | Freq: Once | ORAL | Status: AC
  Administered 2016-05-17: 2.5 mg via ORAL
  Filled 2016-05-17: qty 1

## 2016-05-17 MED ORDER — FUROSEMIDE 40 MG PO TABS
40.0000 mg | ORAL_TABLET | Freq: Two times a day (BID) | ORAL | Status: DC
  Administered 2016-05-17 – 2016-06-04 (×36): 40 mg via ORAL
  Filled 2016-05-17 (×36): qty 1

## 2016-05-17 MED ORDER — CARVEDILOL 3.125 MG PO TABS
3.1250 mg | ORAL_TABLET | Freq: Two times a day (BID) | ORAL | Status: DC
  Administered 2016-05-17 – 2016-06-04 (×36): 3.125 mg via ORAL
  Filled 2016-05-17 (×36): qty 1

## 2016-05-17 NOTE — Progress Notes (Addendum)
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 She denies any chest pain or SOB. No orthopnea or PND.   She is very thankful for everything we have done for her.   CURRENT MEDS  . amiodarone  400 mg Oral BID  . aspirin EC  81 mg Oral Daily  . atenolol  12.5 mg Oral BID  . clonazePAM  0.5 mg Oral QHS  . escitalopram  10 mg Oral QHS  . famotidine  20 mg Oral BID  . feeding supplement (ENSURE ENLIVE)  237 mL Oral BID BM  . furosemide  60 mg Oral BID  . insulin aspart  0-20 Units Subcutaneous TID WC  . insulin detemir  28 Units Subcutaneous Daily  . nystatin   Topical BID  . senna-docusate  2 tablet Oral BID  . spironolactone  50 mg Oral Daily  . traZODone  100 mg Oral QHS  . warfarin  2.5 mg Oral ONCE-1800  . Warfarin - Pharmacist Dosing Inpatient   Does not apply q1800     OBJECTIVE  Filed Vitals:   05/16/16 0502 05/16/16 1437 05/16/16 2147 05/17/16 0643  BP: 109/53 116/56 89/44 117/52  Pulse: 50 45 49 47  Temp: 98 F (36.7 C) 98.6 F (37 C)  98.7 F (37.1 C)  TempSrc:  Oral Oral  Oral  Resp: 18 18  18   Weight: 230 lb (104.327 kg)   231 lb (104.781 kg)  SpO2: 95% 99%  99%    Intake/Output Summary (Last 24 hours) at 05/17/16 1123 Last data filed at 05/16/16 1700  Gross per 24 hour  Intake    240 ml  Output      0 ml  Net    240 ml   Filed Weights   05/15/16 0533 05/16/16 0502 05/17/16 0643  Weight: 227 lb 4.7 oz (103.1 kg) 230 lb (104.327 kg) 231 lb (104.781 kg)    PHYSICAL EXAM  General: AAOx3 Neuro: Moves all extremities spontaneously. HEENT:  Normal  Neck: Supple without bruits or JVD. Lungs:  Resp regular and unlabored, CTA Thorax: well healing sternotomy scar.  Heart: RRR no s3, s4, or murmurs. Abdomen: Soft, non-tender, non-distended, BS + x 4.  Extremities: No clubbing, cyanosis, mild chronic edema. DP/PT/Radials 2+ and equal bilaterally.  Accessory Clinical Findings  CBC  Recent Labs  05/15/16 0550  WBC 11.6*  NEUTROABS 7.8*  HGB 10.5*  HCT 36.2  MCV 87.9  PLT 123456*   Basic Metabolic Panel  Recent Labs  05/15/16 0550  NA 136  K 4.0  CL 90*  CO2 38*  GLUCOSE 111*  BUN 21*  CREATININE 1.10*  CALCIUM 10.9*   Dg Chest Port 1 View  05/04/2016  CLINICAL DATA:  Shortness of breath.  Chest tube placement. EXAM: PORTABLE CHEST 1 VIEW COMPARISON:  05/03/2016. FINDINGS: Right IJ line in stable position. Interim removal of right chest tube. Left chest tube in stable position. No pneumothorax. Prior CABG with bilateral diffuse pulmonary infiltrates consistent with persistent pulmonary edema, slightly increased from prior exam. Small pleural effusions cannot be excluded. IMPRESSION: 1. Interim removal of right chest tube. Left chest tube in stable position. No pneumothorax. Right IJ line stable position. 2. Prior CABG. Cardiomegaly with diffuse bilateral pulmonary infiltrates consistent with pulmonary edema again noted. Pulmonary edema has progressed slightly from prior exam. Small pleural effusions cannot be excluded .  Electronically Signed   By: Rosana Fret: a-fib with RVR from 6:30 till 7:30 this am, then intermittent pacing, now SR    ASSESSMENT AND PLAN  60 year old post MV Repair on 04/30/16  1. PAF with RVR, she is in sinus bradycardia with ventricular rate in 40', BP is soft - I would decraese amiodarone to 200 mg po daily - switch atenolol 12.5 mg po BID to carvedilol 3.125 mg po BID - on coumadin, INR today 3.4, pharmacy follow  2. Acute on chronic diastolic CHF, she appears euvolemic now, BP soft, weight stable, negative 14 lbs from 05/08/16 - I would decrease lasix to 40 mg po BID and monitor, Crea 1.1, K 4.0  3. Hypertension - she is now hypotensive and bradycardic, change atenolol to carvedilol 3.125 mg po BID  4. S/P MV repair, we will order a post op echo  Signed, Ena Dawley MD, [facility] 05/17/2016

## 2016-05-17 NOTE — Progress Notes (Addendum)
Physical Therapy Session Note  Patient Details  Name: Margaret [name] MRN: [id number] Date of Birth: 11-06-1956  Today's Date: 05/17/2016 PT Individual Time: [id number] PT Individual Time Calculation (min): 43 min   Short Term Goals: Week 1:  PT Short Term Goal 1 (Week 1): Patient will perform bed mobility consistent ly with mod A  PT Short Term Goal 2 (Week 1): Patient will propell WC 134ft in controlled environment with min A.  PT Short Term Goal 3 (Week 1): Patient will ambulate 21ft with LRAD and max A from PT.  PT Short Term Goal 4 (Week 1): Patient will perform stedy transfers with mod A.   Skilled Therapeutic Interventions/Progress Updates:    Pt received in recliner & agreeable to PT, noting intermittent 3/10 sharp/sore pain in chest (occurs with deep inhalation). Educated pt on sternal precautions and need to not push/pull with BUE & pt voiced understanding. Provided mod A for donning R shoe & total A for donning L shoe & AFO. Educated pt on scooting out to edge of seat by shifting weight and advancing buttocks & pt able to demonstrate with supervision/min A. Educated pt on anterior weight shifts, and pt self selected to rock forwards 3 times for attempting sit<>stand without BUE support. Pt performed multiple trials with goal of clearing buttocks from seat & pt able to achieve this 50% of the time. Pt also able to achieve sit>stand with max A from PT and hold to hemiwalker while standing with Max A for 10 seconds; pt with L lateral lean during standing. After task pt fatigued & SOB; educated pt on pursed lip breathing. After rest break pt able to achieve sit>stand again with Max A & tolerate standing x 1 minute with R hemiwalker & mod A. Pt required max A for controlled descent during stand>sit. At end of session pt left in recliner with all needs within reach, set up with dinner tray, & husband present.  Pt on 1L/min supplemental oxygen via nasal cannula throughout session; HR = 50  bpm & SpO2 = 100% at rest.  Therapy Documentation Precautions:  Precautions Precautions: Fall, Sternal Precaution Comments: L hemiparesis; L neglect; L subluxed shoulder Required Braces or Orthoses: Other Brace/Splint (left shoulder taped) Restrictions Weight Bearing Restrictions: Yes Other Position/Activity Restrictions: sternal precautions  Pain: Pain Assessment Pain Assessment: 0-10 Pain Score: 3  Pain Type:  (intermittent) Pain Location: Chest Pain Descriptors / Indicators: Sore;Sharp Pain Intervention(s): Repositioned   See Function Navigator for Current Functional Status.   Therapy/Group: Individual Therapy  [name] 05/17/2016, 12:52 PM

## 2016-05-17 NOTE — Progress Notes (Signed)
[place] PHYSICAL MEDICINE & REHABILITATION     PROGRESS NOTE  Subjective/Complaints:  Bowel movement today  ROS: Denies CP, SOB, nausea, vomiting, diarrhea.  Objective: Vital Signs: Blood pressure 117/52, pulse 47, temperature 98.7 F (37.1 C), temperature source Oral, resp. rate 18, weight 104.781 kg (231 lb), SpO2 99 %. Dg Abd 1 View  05/15/2016  CLINICAL DATA:  Rectal pain EXAM: ABDOMEN - 1 VIEW COMPARISON:  02/05/2016 FINDINGS: There is normal small bowel gas pattern. Moderate stool noted within rectum. The rectum measures 6.2 cm in diameter. Fecal impaction cannot be excluded. Some colonic stool noted within distal colon. IMPRESSION: Normal small bowel gas pattern. Moderate stool noted within rectum measures 6.2 cm in diameter. Fecal impaction cannot be excluded. Electronically Signed   By: Lahoma Crocker M.D.   On: 05/15/2016 17:41    Recent Labs  05/15/16 0550  WBC 11.6*  HGB 10.5*  HCT 36.2  PLT 685*    Recent Labs  05/15/16 0550  NA 136  K 4.0  CL 90*  GLUCOSE 111*  BUN 21*  CREATININE 1.10*  CALCIUM 10.9*   CBG (last 3)   Recent Labs  05/16/16 1655 05/16/16 2102 05/17/16 0644  GLUCAP 112* 193* 119*    Wt Readings from Last 3 Encounters:  05/17/16 104.781 kg (231 lb)  05/12/16 105.008 kg (231 lb 8 oz)  04/14/16 117.935 kg (260 lb)    Physical Exam:  BP 117/52 mmHg  Pulse 47  Temp(Src) 98.7 F (37.1 C) (Oral)  Resp 18  Wt 104.781 kg (231 lb)  SpO2 99% Constitutional: She appears well-developed. Obese  HENT: Normocephalic and atraumatic.  Eyes: EOM are normal. Right eye exhibits no discharge. Left eye exhibits no discharge.  Cardiovascular: Regular Rate and rhythm. Respiratory: No respiratory distress. Effort normal.  GI: Soft. Bowel sounds are normal.  Musculoskeletal: She exhibits edema. She exhibits no tenderness.  Neurological: She is alert. A cranial nerve deficit is present.  Left facial weakness  Severe dysarthria but  intelligible.  Follows simple commands. Has reasonable insight and awareness. Memory fair. Delayed problem solving RUE/RLE: 4+/5 proximal to distal LUE 0/5 proximal to distal.  LLE: 2/5 proximal to distal. No increase in tone noted Skin: Chest incision clean dry and dressed. Vascular changes bilateral lower extremities  Psychiatric: Her affect is blunt and flat. Her speech is delayed. She is slowed. (All improved today.)   Assessment/Plan: 1. 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 Physiatrist is providing close team supervision and 24 hour management of active medical problems listed below. Physiatrist and rehab team continue to assess barriers to discharge/monitor patient progress toward functional and medical goals.  Function:  Bathing Bathing position   Position: Shower  Bathing parts Body parts bathed by patient: Chest, Abdomen, Front perineal area, Right upper leg, Left upper leg Body parts bathed by helper: Left lower leg, Right lower leg, Back, Buttocks, Left arm, Right arm  Bathing assist Assist Level: Touching or steadying assistance(Pt > 75%) (patient completed 50%, moderate assist)      Upper Body Dressing/Undressing Upper body dressing   What is the patient wearing?: Pull over shirt/dress Bra - Perfomed by patient: Thread/unthread right bra strap, Thread/unthread left bra strap, Hook/unhook bra (pull down sports bra)   Pull over shirt/dress - Perfomed by patient: Thread/unthread right sleeve, Thread/unthread left  sleeve, Put head through opening, Pull shirt over trunk          Upper body assist Assist Level: Touching or steadying assistance(Pt > 75%)      Lower Body Dressing/Undressing Lower body dressing   What is the patient wearing?:  Socks, Shoes, Pants       Pants- Performed by helper: Thread/unthread right pants leg, Pull pants up/down, Thread/unthread left pants leg       Socks - Performed by helper: Don/doff right sock, Don/doff left sock   Shoes - Performed by helper: Don/doff right shoe, Don/doff left shoe, Fasten right, Fasten left          Lower body assist Assist for lower body dressing: Touching or steadying assistance (Pt > 75%)      Toileting Toileting Toileting activity did not occur: No continent bowel/bladder event   Toileting steps completed by helper: Adjust clothing prior to toileting, Performs perineal hygiene, Adjust clothing after toileting    Toileting assist Assist level: Two helpers   Transfers Chair/bed transfer   Chair/bed transfer method: Stand pivot Chair/bed transfer assist level: Maximal assist (Pt 25 - 49%/lift and lower) Chair/bed transfer assistive device: Bedrails, Armrests     Locomotion Ambulation     Max distance: 18ft Assist level: Maximal assist (Pt 25 - 49%)   Wheelchair   Type: Manual Max wheelchair distance: 30 Assist Level: Supervision or verbal cues  Cognition Comprehension Comprehension assist level: Follows basic conversation/direction with extra time/assistive device  Expression Expression assist level: Expresses basic 75 - 89% of the time/requires cueing 10 - 24% of the time. Needs helper to occlude trach/needs to repeat words.  Social Interaction Social Interaction assist level: Interacts appropriately 75 - 89% of the time - Needs redirection for appropriate language or to initiate interaction.  Problem Solving Problem solving assist level: Solves basic 75 - 89% of the time/requires cueing 10 - 24% of the time  Memory Memory assist level: Recognizes or recalls 75 - 89% of the time/requires cueing 10 - 24% of the time    Medical Problem List and Plan: 1. 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 Sternal precautions after mitral valve repair  Continue CIR  PRAFO, hand splints ordered 2. DVT Prophylaxis/Anticoagulation: Coumadin therapy after mitral valve repair  INR Therapeutic today. Pharmacy protocol 3. Pain Management: Oxycodone as needed 4. Mood/panic attacks: Lexapro 10 mg daily at bedtime, trazodone 100 mg daily at bedtime, Klonopin 0.5 mg daily at bedtime and 3 times a day as needed 5. Neuropsych: This patient is not fully capable of making decisions on her own behalf. 6. Skin/Wound Care: Routine skin checks 7. Fluids/Electrolytes/Nutrition: Routine I&O  8. Dysphagia. Dysphagia #3 thin liquids. Follow speech therapy 9. PAF with RVR. Continue amiodarone, Aldactone 50 mg daily, Tenormin 12.5 mg twice a day. Follow-up cardiology services 10. Acute on chronic diastolic congestive heart failure.   Lasix 80 mg daily, increased to 60 BID on 6/22 .  daily weights  Monitor for any signs of fluid overload Filed Weights   05/15/16 0533 05/16/16 0502 05/17/16 0643  Weight: 103.1 kg (227 lb 4.7 oz) 104.327 kg (230 lb) 104.781 kg (231 lb)  11. Diabetes mellitus with peripheral neuropathy. Check blood sugars before meals and at bedtime. Continue Levemir 28 units daily. Diabetic teaching  Fairly controlled at present 36. Leukocytosis: Trending down  Currently afebrile  WBCs 11.6 on 6/23  UA negative, U  culture pending  Chest x-ray from 6/21 showing interstitial edema and? Infiltrate/atelectasis (appears to have slightly improved from previous x-ray) 13. AKI  Creatinine 1.10 on 6/23  Encourage fluid intake 14. Constipation,, improving after disimpaction LOS (Days) 5 A FACE TO FACE EVALUATION WAS PERFORMED  Charlett Blake 05/17/2016 10:56 AM

## 2016-05-17 NOTE — Progress Notes (Signed)
ANTICOAGULATION CONSULT NOTE - Follow Up Consult  Pharmacy Consult for coumadin Indication: MVR  Allergies  Allergen Reactions  . Codeine Hives, Itching and Other (See Comments)    "breathing problems"    Patient Measurements: Weight: 231 lb (104.781 kg) Heparin Dosing Weight:   Vital Signs: Temp: 98.7 F (37.1 C) (06/25 0643) Temp Source: Oral (06/25 0643) BP: 117/52 mmHg (06/25 0643) Pulse Rate: 47 (06/25 0643)  Labs:  Recent Labs  05/15/16 0550 05/16/16 0625 05/17/16 0641  HGB 10.5*  --   --   HCT 36.2  --   --   PLT 685*  --   --   LABPROT 30.3* 33.8* 34.2*  INR 2.95* 3.43* 3.47*  CREATININE 1.10*  --   --     Estimated Creatinine Clearance: 65.3 mL/min (by C-G formula based on Cr of 1.1).   Medications:  Scheduled:  . amiodarone  400 mg Oral BID  . aspirin EC  81 mg Oral Daily  . atenolol  12.5 mg Oral BID  . clonazePAM  0.5 mg Oral QHS  . escitalopram  10 mg Oral QHS  . famotidine  20 mg Oral BID  . feeding supplement (ENSURE ENLIVE)  237 mL Oral BID BM  . furosemide  60 mg Oral BID  . insulin aspart  0-20 Units Subcutaneous TID WC  . insulin detemir  28 Units Subcutaneous Daily  . nystatin   Topical BID  . senna-docusate  2 tablet Oral BID  . spironolactone  50 mg Oral Daily  . traZODone  100 mg Oral QHS  . warfarin  2.5 mg Oral ONCE-1800  . 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 INR remains therapeutic at 3.47 today. No CBC today but last two stable. No overt bleeding noted.     DDI: PTA amio  Goal of Therapy:  INR 2.5-3.5 Monitor platelets by anticoagulation protocol: Yes   Plan:  Coumadin 2.5 mg po x1 Daily INR Monitor CBC, s/sx bleeding, DI with amiodarone  Stephens November, PharmD Clinical Pharmacy Resident 10:11 AM, 05/17/2016

## 2016-05-18 ENCOUNTER — Inpatient Hospital Stay (HOSPITAL_COMMUNITY): Payer: Medicaid Other | Admitting: Occupational Therapy

## 2016-05-18 ENCOUNTER — Inpatient Hospital Stay (HOSPITAL_COMMUNITY): Payer: Medicaid Other | Admitting: Physical Therapy

## 2016-05-18 ENCOUNTER — Inpatient Hospital Stay (HOSPITAL_COMMUNITY): Payer: Medicaid Other | Admitting: Speech Pathology

## 2016-05-18 LAB — GLUCOSE, CAPILLARY
GLUCOSE-CAPILLARY: 106 mg/dL — AB (ref 65–99)
GLUCOSE-CAPILLARY: 134 mg/dL — AB (ref 65–99)
Glucose-Capillary: 198 mg/dL — ABNORMAL HIGH (ref 65–99)
Glucose-Capillary: 205 mg/dL — ABNORMAL HIGH (ref 65–99)

## 2016-05-18 LAB — PROTIME-INR
INR: 3.17 — AB (ref 0.00–1.49)
Prothrombin Time: 31.9 seconds — ABNORMAL HIGH (ref 11.6–15.2)

## 2016-05-18 MED ORDER — GLUCERNA SHAKE PO LIQD
237.0000 mL | Freq: Two times a day (BID) | ORAL | Status: DC
  Administered 2016-05-19 – 2016-05-29 (×14): 237 mL via ORAL

## 2016-05-18 MED ORDER — WARFARIN SODIUM 5 MG PO TABS
5.0000 mg | ORAL_TABLET | Freq: Once | ORAL | Status: AC
  Administered 2016-05-18: 5 mg via ORAL
  Filled 2016-05-18: qty 1

## 2016-05-18 NOTE — Progress Notes (Signed)
[place] PHYSICAL MEDICINE & REHABILITATION     PROGRESS NOTE  Subjective/Complaints:  Patient sleeping comfortably in bed this morning. She is easily arousable. She denies any issues.  ROS: Denies CP, SOB, nausea, vomiting, diarrhea.  Objective: Vital Signs: Blood pressure 122/57, pulse 57, temperature 98.4 F (36.9 C), temperature source Oral, resp. rate 18, weight 105.461 kg (232 lb 8 oz), SpO2 99 %. No results found. No results for input(s): WBC, HGB, HCT, PLT in the last 72 hours. No results for input(s): NA, K, CL, GLUCOSE, BUN, CREATININE, CALCIUM in the last 72 hours.  Invalid input(s): CO CBG (last 3)   Recent Labs  05/17/16 1632 05/17/16 2128 05/18/16 0635  GLUCAP 196* 151* 106*    Wt Readings from Last 3 Encounters:  05/18/16 105.461 kg (232 lb 8 oz)  05/12/16 105.008 kg (231 lb 8 oz)  04/14/16 117.935 kg (260 lb)    Physical Exam:  BP 122/57 mmHg  Pulse 57  Temp(Src) 98.4 F (36.9 C) (Oral)  Resp 18  Wt 105.461 kg (232 lb 8 oz)  SpO2 99% Constitutional: She appears well-developed. Obese  HENT: Normocephalic and atraumatic.  Eyes: EOM are normal. Right eye exhibits no discharge. Left eye exhibits no discharge.  Cardiovascular: Regular Rate and rhythm. Respiratory: No respiratory distress. Effort normal.  GI: Soft. Bowel sounds are normal.  Musculoskeletal: She exhibits edema. She exhibits no tenderness.  Neurological: She is alert. A cranial nerve deficit is present.  Left facial weakness  Severe dysarthria but intelligible.  Follows simple commands. Has reasonable insight and awareness. Memory fair. Delayed problem solving RUE/RLE: 4+/5 proximal to distal LUE 0/5 proximal to distal.  LLE: 2/5 proximal to distal. No increase in tone noted Skin: Chest incision clean dry. Vascular changes bilateral lower extremities  Psychiatric: Her affect is blunt and flat. Her speech is delayed. She is slowed.    Assessment/Plan: 1. 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 Physiatrist is providing close team supervision and 24 hour management of active medical problems listed below. Physiatrist and rehab team continue to assess barriers to discharge/monitor patient progress toward functional and medical goals.  Function:  Bathing Bathing position   Position: Shower  Bathing parts Body parts bathed by patient: Chest, Abdomen, Front perineal area, Right upper leg, Left upper leg Body parts bathed by helper: Left lower leg, Right lower leg, Back, Buttocks, Left arm, Right arm  Bathing assist Assist Level: Touching or steadying assistance(Pt > 75%) (patient completed 50%, moderate assist)      Upper Body Dressing/Undressing Upper body dressing   What is the patient wearing?: Pull over shirt/dress Bra - Perfomed by patient: Thread/unthread right bra strap, Thread/unthread left bra strap, Hook/unhook bra (pull down sports bra)   Pull over shirt/dress - Perfomed by patient: Thread/unthread right sleeve, Thread/unthread left sleeve, Put head through opening, Pull shirt over trunk          Upper body assist Assist Level: Touching or steadying assistance(Pt > 75%)      Lower Body Dressing/Undressing Lower body dressing   What is the patient wearing?: Socks, Shoes, Pants       Pants- Performed by helper: Thread/unthread right pants leg, Pull pants up/down, Thread/unthread left pants leg       Socks - Performed by helper: Don/doff right sock, Don/doff left sock  Shoes - Performed by helper: Don/doff right shoe, Don/doff left shoe, Fasten right, Fasten left          Lower body assist Assist for lower body dressing: Touching or steadying assistance (Pt > 75%)      Toileting Toileting Toileting activity  did not occur: No continent bowel/bladder event   Toileting steps completed by helper: Adjust clothing prior to toileting, Performs perineal hygiene, Adjust clothing after toileting    Toileting assist Assist level: Two helpers   Transfers Chair/bed transfer   Chair/bed transfer method: Stand pivot Chair/bed transfer assist level: Maximal assist (Pt 25 - 49%/lift and lower) Chair/bed transfer assistive device: Bedrails, Armrests     Locomotion Ambulation     Max distance: 60ft Assist level: Maximal assist (Pt 25 - 49%)   Wheelchair   Type: Manual Max wheelchair distance: 30 Assist Level: Supervision or verbal cues  Cognition Comprehension Comprehension assist level: Understands complex 90% of the time/cues 10% of the time  Expression Expression assist level: Expresses complex 90% of the time/cues < 10% of the time  Social Interaction Social Interaction assist level: Interacts appropriately 90% of the time - Needs monitoring or encouragement for participation or interaction.  Problem Solving Problem solving assist level: Solves basic 75 - 89% of the time/requires cueing 10 - 24% of the time  Memory Memory assist level: More than reasonable amount of time    Medical Problem List and Plan: 1. 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 Sternal precautions after mitral valve repair  Continue CIR  PRAFO, hand splints  2. DVT Prophylaxis/Anticoagulation: Coumadin therapy after mitral valve repair  INR Therapeutic today. Pharmacy protocol 3. Pain Management: Oxycodone as needed 4. Mood/panic attacks: Lexapro 10 mg daily at bedtime, trazodone 100 mg daily at bedtime, Klonopin 0.5 mg daily at bedtime and 3 times a day as needed 5. Neuropsych: This patient is not fully capable of making decisions on her own behalf. 6. Skin/Wound Care: Routine skin checks 7.  Fluids/Electrolytes/Nutrition: Routine I&O  8. Dysphagia. Dysphagia #3 thin liquids. Follow speech therapy 9. PAF with RVR. Continue amiodarone, Aldactone 50 mg daily, Tenormin 12.5 mg twice a day. Follow-up cardiology services 10. Acute on chronic diastolic congestive heart failure.   Lasix 80 mg daily, increased to 60 BID on 6/22 .  daily weights  Monitor for any signs of fluid overload Filed Weights   05/16/16 0502 05/17/16 0643 05/18/16 0500  Weight: 104.327 kg (230 lb) 104.781 kg (231 lb) 105.461 kg (232 lb 8 oz)  11. Diabetes mellitus with peripheral neuropathy. Check blood sugars before meals and at bedtime. Continue Levemir 28 units daily. Diabetic teaching  Elevation yesterday, however otherwise fairly well controlled  Continue to monitor 12. Leukocytosis: Trending down  Currently afebrile  WBCs 11.6 on 6/23  UA negative, U culture with multiple species  Chest x-ray from 6/21 showing interstitial edema and? Infiltrate/atelectasis (appears to have slightly improved from previous x-ray) 13. AKI  Creatinine 1.10 on 6/23  Encourage fluid intake 14. Constipation: Improved  LOS (Days) 6 A FACE TO FACE EVALUATION WAS PERFORMED  Lynnda Wiersma Lorie Phenix 05/18/2016 8:36 AM

## 2016-05-18 NOTE — Progress Notes (Signed)
Occupational Therapy Session Note  Patient Details  Name: Margaret [name] MRN: [id number] Date of Birth: 28-Jul-1956  Today's Date: 05/18/2016 OT Individual Time: 1430-1515 OT Individual Time Calculation (min): 45 min    Short Term Goals: Week 1:  OT Short Term Goal 1 (Week 1): Pt will be mod I with bed mobility with HOB down as a precursor for ADLs  OT Short Term Goal 2 (Week 1): Pt will complete UB bathing and dressing seated EOB unsupported with moderate assistance  OT Short Term Goal 3 (Week 1): Pt will perform toilet transfer with mod assist  OT Short Term Goal 4 (Week 1): Pt will independently verbalize and adhere to sternal precautions 100% of the time  Skilled Therapeutic Interventions/Progress Updates:    Upon entering the room, pt seated in recliner chair with no c/o pain this session. Pt reporting, "I'm wet." Pt declined toileting this session. Pt standing from recliner chair with 3 attempts to come into standing secondary to sternal precautions. STEDY utilized with +2 assist for safety and per pt request. Pt standing for 3 minutes while NT assisted with hygiene and OT assisted with clothing management. OT providing pt with cues for pursed lip breathing as she began taking shallow breaths. Pt taking 2 rest breaks secondary to fatigue but sit <>stand from elevated STEDY seat with min A for standing. Pt returning to recliner chair. OT educated pt on secondary stroke risk education, risk factors, and strategies for stress management. Pt verbalized understanding and active participant in education. Pt remaining in recliner chair with call bell and all needed items within reach.   Therapy Documentation Precautions:  Precautions Precautions: Fall, Sternal Precaution Comments: L hemiparesis; L neglect; L subluxed shoulder Required Braces or Orthoses: Other Brace/Splint (left shoulder taped) Restrictions Weight Bearing Restrictions: Yes LLE Weight Bearing: Weight bearing as  tolerated Other Position/Activity Restrictions: sternal precautions Pain: Pain Assessment Pain Assessment: No/denies pain  See Function Navigator for Current Functional Status.   Therapy/Group: Individual Therapy    Phineas Semen 05/18/2016, 4:32 PM

## 2016-05-18 NOTE — Progress Notes (Signed)
Physical Therapy Session Note  Patient Details  Name: Margaret [name] MRN: [id number] Date of Birth: 12-Apr-1956  Today's Date: 05/18/2016 PT Individual Time: 1100-1204 and 1530-1618 PT Individual Time Calculation (min): 64 min and 48 min  Short Term Goals: Week 1:  PT Short Term Goal 1 (Week 1): Patient will perform bed mobility consistent ly with mod A  PT Short Term Goal 2 (Week 1): Patient will propell WC 159ft in controlled environment with min A.  PT Short Term Goal 3 (Week 1): Patient will ambulate 30ft with LRAD and max A from PT.  PT Short Term Goal 4 (Week 1): Patient will perform stedy transfers with mod A.   Skilled Therapeutic Interventions/Progress Updates:    Treatment 1: Discussed with PA pt's sternal precautions and potential need to push with UE's 2/2 subacute stroke; PA to follow up with CT team. Pt received in bed & agreeable to PT noting 2/10 aching pain in chest at rest during session. Pt completed supine>sidelying with max A and sidelying>sit with max A. Pt required maximum multimodal cuing to not push with R elbow & pt able to demonstrate this after initial trial but with Max A. Completed supine<>sit transfers x 2 additional trials with cuing for pt to hook LLE with RLE to assist extremity on/off of bed. Pt continues to require more cuing and education with task. Pt performed LLE long arc quads x 10 reps with 2/5 muscle activation. PT provided total A for donning shoes & L AFO. Pt able to scoot L<>R with mod/max A & maximum cuing for head/hips relationship & anterior weight shift. Pt completed sit<>stand transfers with max A from bed, without UE support & able to tolerate standing 30 seconds + 55 seconds + 15 seconds  with max A for balance. Utilized stedy lift for safety to transfer pt bed>recliner. Required +2 assist for sit>stand in lift due to pt not utilizing BUE for transfer & 2/2 fatigue. At end of session pt left in recliner with NT present.  Pt on 1L/min  supplemental oxygen via nasal cannula throughout session.  Treatment 2: Pt received in recliner & agreeable to PT, denying c/o pain at rest. Set up pt for stedy transfer but pt unable to clear buttocks from recliner even with max A. Pt reported significant fatigue. Provided pt with LUE givemore sling & donned it total A; educated pt on purpose of sling. Pt performed LLE long arc quads 2x 10 for neuro re-ed & strengthening. Pt reported stress over not being able to stand, perform other tasks during therapy, or use BUE's. PT provided therapeutic listening & education. Educated pt on stroke recovery. At end of session pt left in recliner with all needs within reach.   Therapy Documentation Precautions:  Precautions Precautions: Fall, Sternal Precaution Comments: L hemiparesis; L neglect; L subluxed shoulder Required Braces or Orthoses: Other Brace/Splint (left shoulder taped) Restrictions Weight Bearing Restrictions: Yes Other Position/Activity Restrictions: sternal precautions  Pain: Pain Assessment Pain Assessment: 0-10 Pain Score: 2  Pain Location: Chest Pain Descriptors / Indicators: Aching Pain Intervention(s): Repositioned   See Function Navigator for Current Functional Status.   Therapy/Group: Individual Therapy  [name] 05/18/2016, 7:47 AM

## 2016-05-18 NOTE — Progress Notes (Signed)
ANTICOAGULATION CONSULT NOTE - Follow Up Consult  Pharmacy Consult for coumadin Indication: MVR  Allergies  Allergen Reactions  . Codeine Hives, Itching and Other (See Comments)    "breathing problems"    Patient Measurements: Weight: 232 lb 8 oz (105.461 kg) Heparin Dosing Weight:   Vital Signs: Temp: 98.4 F (36.9 C) (06/26 0600) Temp Source: Oral (06/26 0600) BP: 127/60 mmHg (06/26 0942) Pulse Rate: 61 (06/26 0942)  Labs:  Recent Labs  05/16/16 0625 05/17/16 0641 05/18/16 0950  LABPROT 33.8* 34.2* 31.9*  INR 3.43* 3.47* 3.17*    Estimated Creatinine Clearance: 65.6 mL/min (by C-G formula based on Cr of 1.1).   Medications:  Scheduled:  . amiodarone  200 mg Oral Daily  . aspirin EC  81 mg Oral Daily  . carvedilol  3.125 mg Oral BID WC  . clonazePAM  0.5 mg Oral QHS  . escitalopram  10 mg Oral QHS  . famotidine  20 mg Oral BID  . feeding supplement (ENSURE ENLIVE)  237 mL Oral BID BM  . furosemide  40 mg Oral BID  . insulin aspart  0-20 Units Subcutaneous TID WC  . insulin detemir  28 Units Subcutaneous Daily  . nystatin   Topical BID  . senna-docusate  2 tablet Oral BID  . spironolactone  50 mg Oral Daily  . traZODone  100 mg Oral QHS  . 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 INR remains therapeutic at 3.17 today. No CBC today but last two stable. No overt bleeding noted. Continue on amiodarone dose decreased from 400 mg BID to 200 mg daily.   Goal of Therapy:  INR 2.5-3.5 Monitor platelets by anticoagulation protocol: Yes   Plan:  Coumadin 5 mg po x1 Daily INR Monitor CBC, s/sx bleeding, DI with amiodarone  Maryanna Shape, PharmD, BCPS  Clinical Pharmacist  Pager: [phone]  12:10 PM, 05/18/2016

## 2016-05-18 NOTE — Progress Notes (Signed)
Speech Language Pathology Daily Session Note  Patient Details  Name: XIAN DEUPREE MRN: [id number] Date of Birth: 1956/02/29  Today's Date: 05/18/2016 SLP Individual Time: [id number] SLP Individual Time Calculation (min): 30 min  Short Term Goals: Week 1: SLP Short Term Goal 1 (Week 1): Patient will demonstrate effecient mastication and oral clearance of regular textures with Min verbal cues, over two sessions to demo readiness for upgrade SLP Short Term Goal 2 (Week 1): Patient will self-monitor and correct oral holding of PO with Min verbal cues  SLP Short Term Goal 3 (Week 1): Patient will sustain attention to functional, familiar tasks for 5-8 minutes with Min verbal cues for redirection  SLP Short Term Goal 4 (Week 1): Patient will utilize speech intelligibility and word finding strategeis during structured phrase-sentence level communication tasks with Min verbal cues  SLP Short Term Goal 5 (Week 1): Patient will utilize external aids to assist with recall of new, daily information with Min vebral cues  SLP Short Term Goal 6 (Week 1): Patient will solve problems during completion of familiar tasks with Mod verbal cues to self-minitor and correct errors  Skilled Therapeutic Interventions:  Pt was seen for skilled ST targeting goals for communication and dysphagia.  SLP facilitated the session with a trial of regular textures to continue working towards diet progression.  Pt demonstrated prolonged but effective mastication of regular textures with minimal residual solids remaining in the oral cavity post swallow.  No overt s/s of aspiration were evident with solids or liquids.  Recommend a trial meal tray of regular textures prior to advancement given that pt's dentures do not fit appropriately at this time which may impact toleration of regular consistencies over the course of an entire meal.  Pt was fully intelligible at the conversational level during exchanges with therapist with mod  I.  No word finding difficulty noted during conversational exchanges either.  Pt was left in recliner with call bell within reach.  Continue per current plan of care.     Function:  Eating Eating   Modified Consistency Diet: Yes Eating Assist Level: Set up assist for;Swallowing techniques: self managed   Eating Set Up Assist For: Opening containers       Cognition Comprehension Comprehension assist level: Understands complex 90% of the time/cues 10% of the time  Expression   Expression assist level: Expresses complex 90% of the time/cues < 10% of the time  Social Interaction Social Interaction assist level: Interacts appropriately 90% of the time - Needs monitoring or encouragement for participation or interaction.  Problem Solving Problem solving assist level: Solves basic 75 - 89% of the time/requires cueing 10 - 24% of the time  Memory Memory assist level: Recognizes or recalls 90% of the time/requires cueing < 10% of the time    Pain Pain Assessment Pain Assessment: No/denies pain   Therapy/Group: Individual Therapy  Daysia Vandenboom, Selinda Orion 05/18/2016, 4:11 PM

## 2016-05-19 ENCOUNTER — Inpatient Hospital Stay (HOSPITAL_COMMUNITY): Payer: Self-pay | Admitting: Occupational Therapy

## 2016-05-19 ENCOUNTER — Inpatient Hospital Stay (HOSPITAL_COMMUNITY): Payer: Medicaid Other | Admitting: Physical Therapy

## 2016-05-19 ENCOUNTER — Inpatient Hospital Stay (HOSPITAL_COMMUNITY): Payer: Self-pay | Admitting: Physical Therapy

## 2016-05-19 ENCOUNTER — Inpatient Hospital Stay (HOSPITAL_COMMUNITY): Payer: Self-pay | Admitting: Speech Pathology

## 2016-05-19 ENCOUNTER — Inpatient Hospital Stay (HOSPITAL_COMMUNITY): Payer: Medicaid Other

## 2016-05-19 DIAGNOSIS — I059 Rheumatic mitral valve disease, unspecified: Secondary | ICD-10-CM

## 2016-05-19 LAB — PROTIME-INR
INR: 2.98 — AB (ref 0.00–1.49)
Prothrombin Time: 30.5 seconds — ABNORMAL HIGH (ref 11.6–15.2)

## 2016-05-19 LAB — ECHOCARDIOGRAM COMPLETE
AVLVOTPG: 7 mmHg
Area-P 1/2: 1.21 cm2
CHL CUP MV M VEL: 91.7
CHL CUP REG VEL DIAS: 155 cm/s
CHL CUP RV SYS PRESS: 33 mmHg
EWDT: 491 ms
FS: 38 % (ref 28–44)
IV/PV OW: 0.73
LA diam end sys: 43 mm
LA diam index: 1.91 cm/m2
LA vol A4C: 71 ml
LA vol: 80.3 mL
LASIZE: 43 mm
LAVOLIN: 35.7 mL/m2
LVOT MV VTI INDEX: 0.71 cm2/m2
LVOT MV VTI: 1.59
LVOT SV: 116 mL
LVOT VTI: 33.4 cm
LVOT area: 3.46 cm2
LVOTD: 21 mm
LVOTPV: 131 cm/s
Lateral S' vel: 8.8 cm/s
MV Annulus VTI: 72.8 cm
MV Dec: 491
MV Peak grad: 8 mmHg
MV pk A vel: 81.2 m/s
MV pk E vel: 145 m/s
MVG: 4 mmHg
MVSPHT: 178 ms
PV Reg grad dias: 10 mmHg
PW: 10.9 mm — AB (ref 0.6–1.1)
RV TAPSE: 10.2 mm
Reg peak vel: 274 cm/s
TRMAXVEL: 274 cm/s
WEIGHTICAEL: 3721.6 [oz_av]

## 2016-05-19 LAB — CBC WITH DIFFERENTIAL/PLATELET
BASOS ABS: 0.1 10*3/uL (ref 0.0–0.1)
Basophils Relative: 1 %
Eosinophils Absolute: 0.6 10*3/uL (ref 0.0–0.7)
Eosinophils Relative: 6 %
HEMATOCRIT: 38.1 % (ref 36.0–46.0)
HEMOGLOBIN: 11 g/dL — AB (ref 12.0–15.0)
LYMPHS PCT: 19 %
Lymphs Abs: 2.1 10*3/uL (ref 0.7–4.0)
MCH: 25.2 pg — ABNORMAL LOW (ref 26.0–34.0)
MCHC: 28.9 g/dL — ABNORMAL LOW (ref 30.0–36.0)
MCV: 87.4 fL (ref 78.0–100.0)
MONO ABS: 0.9 10*3/uL (ref 0.1–1.0)
Monocytes Relative: 8 %
NEUTROS ABS: 7.4 10*3/uL (ref 1.7–7.7)
NEUTROS PCT: 66 %
Platelets: 683 10*3/uL — ABNORMAL HIGH (ref 150–400)
RBC: 4.36 MIL/uL (ref 3.87–5.11)
RDW: 17.2 % — AB (ref 11.5–15.5)
WBC: 11 10*3/uL — ABNORMAL HIGH (ref 4.0–10.5)

## 2016-05-19 LAB — GLUCOSE, CAPILLARY
GLUCOSE-CAPILLARY: 137 mg/dL — AB (ref 65–99)
GLUCOSE-CAPILLARY: 114 mg/dL — AB (ref 65–99)
Glucose-Capillary: 143 mg/dL — ABNORMAL HIGH (ref 65–99)
Glucose-Capillary: 168 mg/dL — ABNORMAL HIGH (ref 65–99)

## 2016-05-19 LAB — BASIC METABOLIC PANEL
ANION GAP: 9 (ref 5–15)
BUN: 15 mg/dL (ref 6–20)
CHLORIDE: 94 mmol/L — AB (ref 101–111)
CO2: 34 mmol/L — ABNORMAL HIGH (ref 22–32)
Calcium: 10.7 mg/dL — ABNORMAL HIGH (ref 8.9–10.3)
Creatinine, Ser: 1.07 mg/dL — ABNORMAL HIGH (ref 0.44–1.00)
GFR, EST NON AFRICAN AMERICAN: 55 mL/min — AB (ref >60)
Glucose, Bld: 125 mg/dL — ABNORMAL HIGH (ref 65–99)
POTASSIUM: 4.4 mmol/L (ref 3.5–5.1)
SODIUM: 137 mmol/L (ref 135–145)

## 2016-05-19 MED ORDER — WARFARIN SODIUM 5 MG PO TABS
5.0000 mg | ORAL_TABLET | Freq: Once | ORAL | Status: AC
  Administered 2016-05-19: 5 mg via ORAL
  Filled 2016-05-19: qty 1

## 2016-05-19 NOTE — Progress Notes (Signed)
Speech Language Pathology Weekly Progress and Session Note  Patient Details  Name: Margaret [name] MRN: [id number] Date of Birth: 09-30-1956  Beginning of progress report period: May 12, 2016   End of progress report period: May 19, 2016   Today's Date: 05/19/2016 SLP Individual Time: 4098-1191 SLP Individual Time Calculation (min): 42 min  Short Term Goals: Week 1: SLP Short Term Goal 1 (Week 1): Patient will demonstrate effecient mastication and oral clearance of regular textures with Min verbal cues, over two sessions to demo readiness for upgrade SLP Short Term Goal 1 - Progress (Week 1): Met SLP Short Term Goal 2 (Week 1): Patient will self-monitor and correct oral holding of PO with Min verbal cues  SLP Short Term Goal 2 - Progress (Week 1): Met SLP Short Term Goal 3 (Week 1): Patient will sustain attention to functional, familiar tasks for 5-8 minutes with Min verbal cues for redirection  SLP Short Term Goal 3 - Progress (Week 1): Met SLP Short Term Goal 4 (Week 1): Patient will utilize speech intelligibility and word finding strategeis during structured phrase-sentence level communication tasks with Min verbal cues  SLP Short Term Goal 4 - Progress (Week 1): Met SLP Short Term Goal 5 (Week 1): Patient will utilize external aids to assist with recall of new, daily information with Min vebral cues  SLP Short Term Goal 5 - Progress (Week 1): Met SLP Short Term Goal 6 (Week 1): Patient will solve problems during completion of familiar tasks with Mod verbal cues to self-minitor and correct errors SLP Short Term Goal 6 - Progress (Week 1): Met    New Short Term Goals: Week 2: SLP Short Term Goal 1 (Week 2): Pt will consume regular textures and thin liquids during a meal with mod I use of swallowing precautions to clear residuals from the oral cavity post swallow.  SLP Short Term Goal 2 (Week 2): Patient will utilize speech intelligibility strategies at the conversational level  with mod I.   SLP Short Term Goal 3 (Week 2): Patient will utilize external aids to assist with recall of new, daily information with supervision verbal cues  SLP Short Term Goal 4 (Week 2): Patient will solve problems during completion of familiar tasks with mod I to self-monitor and correct errors for 100% accuracy.   Weekly Progress Updates:  Pt made functional gains this reporting period and has met 6 out of 6 short term goals.  Pt has demonstrated improved mastication of regular textures during functional snacks.  Will likely be ready for advancement after a trial meal tray to ensure toleration of solids given poorly fitting dentures.  Pt is also at an overall min assist-supervision level assist for basic to semi-complex functional tasks due to mild cognitive impairment.  Pt is intelligible at the conversational level with at most supervision verbal cues for intelligibility strategies.  Pt would continue to benefit from skilled ST while inpatient in order to maximize functional independence and reduce burden of care prior to discharge.  Pt and family education is ongoing.    Intensity: Minumum of 1-2 x/day, 30 to 90 minutes Frequency: 3 to 5 out of 7 days Duration/Length of Stay: 18-22 days Treatment/Interventions: Cognitive remediation/compensation;Cueing hierarchy;Dysphagia/aspiration precaution training;Environmental controls;Functional tasks;Internal/external aids;Patient/family education;Speech/Language facilitation;Therapeutic Activities   Daily Session  Skilled Therapeutic Interventions: Pt was seen for skilled ST targeting cognitive goals.  Therapist administered portions of the ALFA standardized cognitive assessment to continue to address problem solving goals.  Pt was able to complete medication  management subtests for 100% accuracy with supervision verbal cues to monitor and correct errors.  Pt was also able to complete functional math calculations from word problems in 8/10  opportunities with supervision verbal cues.  Pt left in wheelchair at the end of today's therapy session with call bell within reach.  Goals updated on this date to reflect current progress and plan of care.       Function:   Eating Eating                 Cognition Comprehension Comprehension assist level: Understands complex 90% of the time/cues 10% of the time  Expression   Expression assist level: Expresses complex 90% of the time/cues < 10% of the time  Social Interaction Social Interaction assist level: Interacts appropriately 90% of the time - Needs monitoring or encouragement for participation or interaction.  Problem Solving Problem solving assist level: Solves basic 75 - 89% of the time/requires cueing 10 - 24% of the time  Memory Memory assist level: Recognizes or recalls 75 - 89% of the time/requires cueing 10 - 24% of the time   General    Pain Pain Assessment Pain Assessment: No/denies pain   Therapy/Group: Individual Therapy  Jalina Blowers, Melanee Spry 05/19/2016, 4:17 PM

## 2016-05-19 NOTE — Patient Care Conference (Signed)
Inpatient RehabilitationTeam Conference and Plan of Care Update Date: 05/19/2016   Time: 10:40 AM    Patient Name: Margaret [name]      Medical Record Number: [id number]  Date of Birth: [birth year] Sex: Female         Room/Bed: 4W04C/4W04C-01 Payor Info: Payor: MEDICAID [place] / Plan: MEDICAID OF [place] / Product Type: *No Product type* /    Admitting Diagnosis: Milltralvalve Replacment HO CVA  Admit Date/Time:  05/12/2016  6:13 PM Admission Comments: No comment available   Primary Diagnosis:  Cerebrovascular accident (CVA) due to occlusion of right carotid artery ([place]) Principal Problem: Cerebrovascular accident (CVA) due to occlusion of right carotid artery [facility])  Patient Active Problem List   Diagnosis Date Noted  . PAF (paroxysmal atrial fibrillation) ([place])   . Hemiplegia of nondominant side, late effect of cerebrovascular disease ([place])   . 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 Hemiparesis and dysphasia as late effect of cerebrovascular accident (CVA) ([place])   . Flaccid hemiplegia affecting left nondominant side ([place])   . Interstitial edema   . Type 2 diabetes mellitus with peripheral neuropathy (HCC)   . Debilitated 05/12/2016  . Left hemiparesis ([place]) 05/12/2016  . History of subarachnoid hemorrhage   . SOB (shortness of breath)   . History of CVA with residual deficit   . Dysarthria, post-stroke   . Chronic obstructive pulmonary disease ([place])   . Tachypnea   . Bradycardia   . Hypokalemia   . Leukocytosis   . SIRS (systemic inflammatory response syndrome) (HCC)   . Acute blood loss anemia   . Thrombocytosis ([place])   . S/P mitral valve repair 04/30/2016  . 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 Acute on chronic diastolic congestive heart failure due to valvular disease ([place]) 04/14/2016  . CHF due to valvular disease, acute on chronic, diastolic [id number]  . Chronic  diastolic congestive heart failure due to valvular disease ([place]) 04/13/2016  . History of stroke 04/13/2016  . Cerebrovascular accident (CVA) due to occlusion of right carotid artery ([place]) 03/25/2016  . Splenic infarct 03/25/2016  . Type 2 diabetes mellitus with circulatory disorder ([place]) 03/25/2016  . Mitral regurgitation 03/06/2016  . HLD (hyperlipidemia) 03/06/2016  . Right carotid artery occlusion   . Epigastric abdominal tenderness on direct palpation   . Endocarditis of mitral valve   . Positive ANA (antinuclear antibody)   . Streptococcus viridans infection 02/04/2016  . Cerebrovascular accident (CVA) due to embolism of cerebral artery ([place])   . Subarachnoid hemorrhage ([place]) 02/01/2016  . Stroke ([place])   . Visual changes   . Pulmonary infiltrates 01/23/2016  . Pulmonary hypertension assoc with unclear multi-factorial mechanisms ([place]) 01/23/2016  . 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 Insulin dependent diabetes mellitus ([place]) 01/14/2016  . Essential hypertension 01/14/2016  . Hepatic cirrhosis ([place]) 01/14/2016  . COPD (chronic obstructive pulmonary disease) ([place]) 04/27/2012  . Chronic diastolic CHF (congestive heart failure) ([place]) 04/27/2012  . Morbid obesity ([place]) 04/27/2012  . Hypoxia 04/27/2012  . Dyspnea on exertion 04/27/2012  . Smoker 04/27/2012    Expected Discharge Date: Expected Discharge Date: 06/04/16  Team Members Present: Physician leading conference: Dr. Delice Lesch Social Worker Present: Ovidio Kin, LCSW Nurse Present: Junius Creamer, RN PT Present: Lavone Nian, PT OT Present: Willeen Cass, OT SLP Present: Weldon Inches, SLP PPS Coordinator present : Daiva Nakayama, RN, CRRN     Current Status/Progress  Goal Weekly Team Focus  Medical   Debilitation secondary to mitral valve repair 04/30/2016 as well as recent history of subarachnoid hemorrhage with left-sided hemiparesis and dysarthria.  Improve safety, follow weights  see above   Bowel/Bladder   mostly  incontinent  LBM: 6/26  cont x2  timed toileting, and monitor BM    Swallow/Nutrition/ Hydration   Dys 3 with thin, full supervision  least restrictive PO ModI   regular trials   ADL's   transfers mod to max A, bathing still mod A, dressing maxA with more than reasonable time, working on 1 liter of O2  standing balance with min A, bathing min A, dressing min A and toilet transfers with mod A; toileting with mod A  transfer training with new AFO, toielting schedule, bed mobility hemi techniques, standing balance    Mobility   max A for bed mobility, max A for sit<>stand, cuing for maintaining sternal precautions, max A standing balance with 1 UE support  Min A bed mobility, Mod A functional transfers, min A standing balance  functional tasks with sternal precautions, sit<>stands, bed mobility   Communication   min A  Supervision  education and word finding strategies   Safety/Cognition/ Behavioral Observations  min - mod A  Supervision   functional recall, attention and reasoning   Pain   no complaints of pain, but PRN ultram is used occasionally   <2  assess and treat qshift and PRN    Skin   sternal incision with skin glue- C, D, I   no further breakdown/infection   continue to monitor for infection/breakdown qshift and PRN       *See Care Plan and progress notes for long and short-term goals.  Barriers to Discharge: safety, transfers, mobility, CHF, AKI    Possible Resolutions to Barriers:  pt and family edu, follow labs, monitor weights, therapies    Discharge Planning/Teaching Needs:    Home with husband and friend along with friend husband-all three to assist her at home with her care. Husband and son have build a ramp for their home. Pt is making slow gains in therapies.     Team Discussion:  Making slow progress has limitations from sternal precautions and old stroke. She is getting some movement back in arm and leg of affected side. Using AFO's for transfers helps with  stability. DYS 3 thin diet, hoping to upgrade this week.  Revisions to Treatment Plan:  Upgrade diet this week to regular   Continued Need for Acute Rehabilitation Level of Care: The patient requires daily medical management by a physician with specialized training in physical medicine and rehabilitation for the following conditions: Daily direction of a multidisciplinary physical rehabilitation program to ensure safe treatment while eliciting the highest outcome that is of practical value to the patient.: Yes Daily medical management of patient stability for increased activity during participation in an intensive rehabilitation regime.: Yes Daily analysis of laboratory values and/or radiology reports with any subsequent need for medication adjustment of medical intervention for : Cardiac problems;Neurological problems;Diabetes problems;Other  Elease Hashimoto 05/19/2016, 3:38 PM

## 2016-05-19 NOTE — Progress Notes (Signed)
ANTICOAGULATION CONSULT NOTE - Follow Up Consult  Pharmacy Consult for coumadin Indication: MVR  Allergies  Allergen Reactions  . Codeine Hives, Itching and Other (See Comments)    "breathing problems"    Patient Measurements: Weight: 232 lb 9.6 oz (105.507 kg)  Vital Signs: Temp: 98.4 F (36.9 C) (06/27 0440) Temp Source: Oral (06/27 0440) BP: 120/63 mmHg (06/27 0440) Pulse Rate: 63 (06/27 0440)  Labs:  Recent Labs  05/17/16 0641 05/18/16 0950 05/19/16 0642  HGB  --   --  11.0*  HCT  --   --  38.1  PLT  --   --  683*  LABPROT 34.2* 31.9* 30.5*  INR 3.47* 3.17* 2.98*  CREATININE  --   --  1.07*    Estimated Creatinine Clearance: 67.4 mL/min (by C-G formula based on Cr of 1.07).   Medications:  Scheduled:  . amiodarone  200 mg Oral Daily  . aspirin EC  81 mg Oral Daily  . carvedilol  3.125 mg Oral BID WC  . clonazePAM  0.5 mg Oral QHS  . escitalopram  10 mg Oral QHS  . famotidine  20 mg Oral BID  . feeding supplement (GLUCERNA SHAKE)  237 mL Oral BID BM  . furosemide  40 mg Oral BID  . insulin aspart  0-20 Units Subcutaneous TID WC  . insulin detemir  28 Units Subcutaneous Daily  . nystatin   Topical BID  . senna-docusate  2 tablet Oral BID  . spironolactone  50 mg Oral Daily  . traZODone  100 mg Oral QHS  . 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 INR remains therapeutic at 2.98 today. hgb 11, pltc 683K. No overt bleeding noted. Continue on amiodarone dose decreased from 400 mg BID to 200 mg daily.   Goal of Therapy:  INR 2.5-3.5 Monitor platelets by anticoagulation protocol: Yes   Plan:  Coumadin 5 mg po x1 Daily INR Monitor CBC, s/sx bleeding, DI with amiodarone  Maryanna Shape, PharmD, BCPS  Clinical Pharmacist  Pager: [phone]  12:31 PM, 05/19/2016

## 2016-05-19 NOTE — Progress Notes (Signed)
  Echocardiogram 2D Echocardiogram has been performed.  Margaret [name] 05/19/2016, 11:32 AM

## 2016-05-19 NOTE — Progress Notes (Signed)
Physical Therapy Session Note  Patient Details  Name: Margaret [name] MRN: [id number] Date of Birth: 1956/11/23  Today's Date: 05/19/2016 PT Individual Time: 0803-0850 PT Individual Time Calculation (min): 47 min  Pt missed 60 minutes of scheduled PT time (pt off of unit for Echo)  Short Term Goals: Week 1:  PT Short Term Goal 1 (Week 1): Patient will perform bed mobility consistent ly with mod A  PT Short Term Goal 2 (Week 1): Patient will propell WC 123ft in controlled environment with min A.  PT Short Term Goal 3 (Week 1): Patient will ambulate 68ft with LRAD and max A from PT.  PT Short Term Goal 4 (Week 1): Patient will perform stedy transfers with mod A.   Skilled Therapeutic Interventions/Progress Updates:    Treatment 1: Pt received asleep in bed but awakened with verbal & tactile stimulation. Pt agreeable to PT but reporting urgent need to void and requesting bed pan instead of trying to transfer to commode. Pt required max A for rolling L<>R & total A for bed pan placement, (+) void. Pt able to perform peri-hygiene in front independently but required total A for buttocks. Pt denied c/o pain at rest. PT provided max A<>total A to don new shirt, shoes, givemore sling & pants. Pt able to roll L<>R with max A as pt with difficulty performing movements without utilizing BUE. Pt able to transfer sidelying>sit with max A with focus on using core to pull self to upright sitting to limit use of UEs. While sitting EOB PT provided HOH for LUE for forced neuro re-ed use of LUE to brush hair. Transferred pt bed>w/c with +2 for safety & Stedy Lift. Pt able to complete sit<>stand transfer with max A from elevated surface without utilizing BUEs. At end of session pt left in w/c set up with breakfast tray & all needs within reach.  Pt on 1L/min supplemental oxygen via nasal cannula throughout session.  Treatment 2: Pt missed 60 minutes of scheduled PT as pt was off of unit for  Echocardiogram.  Therapy Documentation Precautions:  Precautions Precautions: Fall, Sternal Precaution Comments: L hemiparesis; L neglect; L subluxed shoulder Required Braces or Orthoses: Other Brace/Splint (left shoulder taped) Restrictions Weight Bearing Restrictions: Yes LLE Weight Bearing: Weight bearing as tolerated Other Position/Activity Restrictions: sternal precautions  Pain: Pain Assessment Pain Assessment: No/denies pain Pain Score: 0-No pain   See Function Navigator for Current Functional Status.   Therapy/Group: Individual Therapy  [name] 05/19/2016, 12:22 PM

## 2016-05-19 NOTE — Progress Notes (Signed)
Occupational Therapy Session Note  Patient Details  Name: Margaret [name] MRN: [id number] Date of Birth: 03-19-1956  Today's Date: 05/19/2016 OT Individual Time: 1350-1435 OT Individual Time Calculation (min): 45 min   Short Term Goals: Week 1:  OT Short Term Goal 1 (Week 1): Pt will be mod I with bed mobility with HOB down as a precursor for ADLs  OT Short Term Goal 2 (Week 1): Pt will complete UB bathing and dressing seated EOB unsupported with moderate assistance  OT Short Term Goal 3 (Week 1): Pt will perform toilet transfer with mod assist  OT Short Term Goal 4 (Week 1): Pt will independently verbalize and adhere to sternal precautions 100% of the time  Skilled Therapeutic Interventions/Progress Updates:  Patient found seated in w/c upon OT entering room, pt with no complaints of pain - monitored this during session. Discussed and reviewed sternal precautions. Therapist propelled pt from room to therapy gym. Therapist discussed with patient that transferring to right side is the best, but functionally she will need to be able to transfer to right and left sides. Pt transferred w/c to therapy mat (towards right/strong side), squat pivot with min assist. Pt then performed scooting right to left on therapy mat with min assist needed for management and movement of LLE. Pt also performed shoulder retraction and elevation exercises X10. Pt then transferred back to w/c, to left side with mod assist (squat pivot). Therapist assisted pt back to room and left pt seated in w/c with 1L/min supplemental 02 secondary to her preference. Pt on RA entire session and sats greater than 90% entire session.   Therapy Documentation Precautions:  Precautions Precautions: Fall, Sternal Precaution Comments: L hemiparesis; L neglect; L subluxed shoulder Required Braces or Orthoses: Other Brace/Splint (left shoulder taped) Restrictions Weight Bearing Restrictions: Yes LLE Weight Bearing: Weight bearing as  tolerated Other Position/Activity Restrictions: sternal precautions  Vital Signs: Therapy Vitals Temp: 98.4 F (36.9 C) Temp Source: Oral Pulse Rate: 63 Resp: 18 BP: 120/63 mmHg Patient Position (if appropriate): Sitting Oxygen Therapy SpO2: 98 % O2 Device: Nasal Cannula O2 Flow Rate (L/min): 1 L/min  See Function Navigator for Current Functional Status.  Therapy/Group: Individual Therapy  Chrys Racer , MS, OTR/L, CLT  05/19/2016, 2:44 PM

## 2016-05-19 NOTE — Progress Notes (Signed)
[facility] PHYSICAL MEDICINE & REHABILITATION     PROGRESS NOTE  Subjective/Complaints:  Pt resting comfortably in bed.  She denies complains upon waking up.   ROS: Denies CP, SOB, nausea, vomiting, diarrhea.  Objective: Vital Signs: Blood pressure 120/63, pulse 63, temperature 98.4 F (36.9 C), temperature source Oral, resp. rate 18, weight 105.507 kg (232 lb 9.6 oz), SpO2 98 %. No results found.  Recent Labs  05/19/16 0642  WBC 11.0*  HGB 11.0*  HCT 38.1  PLT 683*    Recent Labs  05/19/16 0642  NA 137  K 4.4  CL 94*  GLUCOSE 125*  BUN 15  CREATININE 1.07*  CALCIUM 10.7*   CBG (last 3)   Recent Labs  05/18/16 1630 05/18/16 2103 05/19/16 0608  GLUCAP 134* 205* 143*    Wt Readings from Last 3 Encounters:  05/19/16 105.507 kg (232 lb 9.6 oz)  05/12/16 105.008 kg (231 lb 8 oz)  04/14/16 117.935 kg (260 lb)    Physical Exam:  BP 120/63 mmHg  Pulse 63  Temp(Src) 98.4 F (36.9 C) (Oral)  Resp 18  Wt 105.507 kg (232 lb 9.6 oz)  SpO2 98% Constitutional: She appears well-developed. Obese  HENT: Normocephalic and atraumatic.  Eyes: EOM are normal. Right eye exhibits no discharge. Left eye exhibits no discharge.  Cardiovascular: Regular Rate and rhythm. Respiratory: No respiratory distress. Effort normal.  GI: Soft. Bowel sounds are normal.  Musculoskeletal: She exhibits edema. She exhibits no tenderness.  Neurological: She is alert. A cranial nerve deficit is present.  Left facial weakness  Severe dysarthria but intelligible.  Follows simple commands. Has reasonable insight and awareness. Memory fair. Delayed problem solving RUE/RLE: 4+/5 proximal to distal LUE 0/5 proximal to distal.  LLE: 2/5 proximal to distal. No increase in tone noted Skin: Chest incision clean dry. Vascular changes bilateral lower extremities  Psychiatric: Her affect is blunt and flat. Her speech is delayed. She is slowed.    Assessment/Plan: 1. 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 Physiatrist is providing close team supervision and 24 hour management of active medical problems listed below. Physiatrist and rehab team continue to assess barriers to discharge/monitor patient progress toward functional and medical goals.  Function:  Bathing Bathing position   Position: Shower  Bathing parts Body parts bathed by patient: Chest, Abdomen, Front perineal area, Right upper leg, Left upper leg Body parts bathed by helper: Left lower leg, Right lower leg, Back, Buttocks, Left arm, Right arm  Bathing assist Assist Level: Touching or steadying assistance(Pt > 75%) (patient completed 50%, moderate assist)      Upper Body Dressing/Undressing Upper body dressing   What is the patient wearing?: Pull over shirt/dress Bra - Perfomed by patient: Thread/unthread right bra strap, Thread/unthread left bra strap, Hook/unhook bra (pull down sports bra)   Pull over shirt/dress - Perfomed by patient: Thread/unthread right sleeve, Thread/unthread left sleeve, Put head through opening, Pull shirt over trunk          Upper body assist Assist Level: Touching or steadying assistance(Pt > 75%)      Lower Body Dressing/Undressing Lower body dressing   What is the patient wearing?: Socks, Shoes, Pants       Pants- Performed by helper: Thread/unthread right pants leg, Pull pants up/down, Thread/unthread left pants leg  Socks - Performed by helper: Don/doff right sock, Don/doff left sock   Shoes - Performed by helper: Don/doff right shoe, Don/doff left shoe, Fasten right, Fasten left          Lower body assist Assist for lower body dressing: Touching or steadying assistance (Pt > 75%)      Toileting Toileting Toileting activity  did not occur: No continent bowel/bladder event   Toileting steps completed by helper: Adjust clothing prior to toileting, Performs perineal hygiene, Adjust clothing after toileting    Toileting assist Assist level: Two helpers   Transfers Chair/bed transfer   Chair/bed transfer method: Stand pivot Chair/bed transfer assist level: 2 helpers Chair/bed transfer assistive device: Mechanical lift Mechanical lift: Ecologist     Max distance: 77ft Assist level: Maximal assist (Pt 25 - 49%)   Wheelchair   Type: Manual Max wheelchair distance: 30 Assist Level: Supervision or verbal cues  Cognition Comprehension Comprehension assist level: Understands complex 90% of the time/cues 10% of the time  Expression Expression assist level: Expresses complex 90% of the time/cues < 10% of the time  Social Interaction Social Interaction assist level: Interacts appropriately 90% of the time - Needs monitoring or encouragement for participation or interaction.  Problem Solving Problem solving assist level: Solves basic 75 - 89% of the time/requires cueing 10 - 24% of the time  Memory Memory assist level: Recognizes or recalls 90% of the time/requires cueing < 10% of the time    Medical Problem List and Plan: 1. 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 Sternal precautions after mitral valve repair  Continue CIR  PRAFO, hand splints  2. DVT Prophylaxis/Anticoagulation: Coumadin therapy after mitral valve repair  INR Therapeutic today. Pharmacy protocol 3. Pain Management: Oxycodone as needed 4. Mood/panic attacks: Lexapro 10 mg daily at bedtime, trazodone 100 mg daily at bedtime, Klonopin 0.5 mg daily at bedtime and 3 times a day as needed 5. Neuropsych: This patient is not fully capable of making decisions on her own behalf. 6. Skin/Wound Care:  Routine skin checks 7. Fluids/Electrolytes/Nutrition: Routine I&O  8. Dysphagia. Dysphagia #3 thin liquids. Follow speech therapy 9. PAF with RVR. Continue amiodarone, Aldactone 50 mg daily, Tenormin 12.5 mg twice a day. Follow-up cardiology services 10. Acute on chronic diastolic congestive heart failure.   Lasix 80 mg daily, increased to 60 BID on 6/22 .  daily weights  Monitor for any signs of fluid overload Filed Weights   05/17/16 0643 05/18/16 0500 05/19/16 0443  Weight: 104.781 kg (231 lb) 105.461 kg (232 lb 8 oz) 105.507 kg (232 lb 9.6 oz)  11. Diabetes mellitus with peripheral neuropathy. Check blood sugars before meals and at bedtime. Continue Levemir 28 units daily. Diabetic teaching  Fairly well controlled  Continue to monitor 12. Leukocytosis: Trending down  Currently afebrile  WBCs 11.0 on 6/27  UA negative, U culture with multiple species  Chest x-ray from 6/21 showing interstitial edema and? Infiltrate/atelectasis (appears to have slightly improved from previous x-ray) 13. AKI  Creatinine 1.07 on 6/27 (stable)  Encourage fluid intake 14. Constipation: Improved 15. ABLA  Hb 11.0 on 6/27  Cont to monitor  LOS (Days) 7 A FACE TO FACE EVALUATION WAS PERFORMED  Baani Bober Lorie Phenix 05/19/2016 9:42 AM

## 2016-05-20 ENCOUNTER — Inpatient Hospital Stay (HOSPITAL_COMMUNITY): Payer: Self-pay | Admitting: Occupational Therapy

## 2016-05-20 ENCOUNTER — Inpatient Hospital Stay (HOSPITAL_COMMUNITY): Payer: Self-pay | Admitting: Physical Therapy

## 2016-05-20 ENCOUNTER — Inpatient Hospital Stay (HOSPITAL_COMMUNITY): Payer: Self-pay | Admitting: Speech Pathology

## 2016-05-20 LAB — GLUCOSE, CAPILLARY
GLUCOSE-CAPILLARY: 136 mg/dL — AB (ref 65–99)
GLUCOSE-CAPILLARY: 153 mg/dL — AB (ref 65–99)
GLUCOSE-CAPILLARY: 150 mg/dL — AB (ref 65–99)
GLUCOSE-CAPILLARY: 179 mg/dL — AB (ref 65–99)

## 2016-05-20 LAB — PROTIME-INR
INR: 3.23 — AB (ref 0.00–1.49)
PROTHROMBIN TIME: 32.3 s — AB (ref 11.6–15.2)

## 2016-05-20 MED ORDER — WARFARIN SODIUM 2.5 MG PO TABS
2.5000 mg | ORAL_TABLET | Freq: Once | ORAL | Status: AC
  Administered 2016-05-20: 2.5 mg via ORAL
  Filled 2016-05-20: qty 1

## 2016-05-20 NOTE — Progress Notes (Signed)
Speech Language Pathology Daily Session Note  Patient Details  Name: Margaret [name] MRN: [id number] Date of Birth: July 18, 1956  Today's Date: 05/20/2016 SLP Individual Time: 1300-1400 SLP Individual Time Calculation (min): 60 min  Short Term Goals: Week 2: SLP Short Term Goal 1 (Week 2): Pt will consume regular textures and thin liquids during a meal with mod I use of swallowing precautions to clear residuals from the oral cavity post swallow.  SLP Short Term Goal 2 (Week 2): Patient will utilize speech intelligibility strategies at the conversational level with mod I.   SLP Short Term Goal 3 (Week 2): Patient will utilize external aids to assist with recall of new, daily information with supervision verbal cues  SLP Short Term Goal 4 (Week 2): Patient will solve problems during completion of familiar tasks with mod I to self-monitor and correct errors for 100% accuracy.   Skilled Therapeutic Interventions: Pt tolerated regular diet upgrade trial tray WFL at mod I level for increased time with mastication. Swallow strategies were self-managed. Intelligibility WFL for conversational speech. Functional math related to time completed with 100% acc and increased time. Pt continues to require large bold font for reading. Pt asking appropriate questions re: goals of care prior to discharge.    Function:  Eating Eating Eating activity did not occur: N/A Modified Consistency Diet: No Eating Assist Level: Set up assist for   Eating Set Up Assist For: Opening containers       Cognition Comprehension Comprehension assist level: Follows basic conversation/direction with no assist  Expression   Expression assist level: Expresses basic needs/ideas: With no assist  Social Interaction Social Interaction assist level: Interacts appropriately 90% of the time - Needs monitoring or encouragement for participation or interaction.  Problem Solving Problem solving assist level: Solves basic problems  with no assist  Memory Memory assist level: Recognizes or recalls 75 - 89% of the time/requires cueing 10 - 24% of the time    Pain Pain Assessment Pain Assessment: No/denies pain  Therapy/Group: Individual Therapy  Vinetta Bergamo MA, CCC-SLP 05/20/2016, 1:49 PM

## 2016-05-20 NOTE — Progress Notes (Signed)
Physical Therapy Weekly Progress Note  Patient Details  Name: Margaret [name] MRN: [id number] Date of Birth: 10-18-1956  Beginning of progress report period: May 13, 2016 End of progress report period: May 20, 2016  Patient has made slow progress and has met 1 of 4 short term goals.  Pt is currently max A for bed mobility, basic transfers due to L hemiplegia and sternal precautions and is only able to stand for 1-2 minutes at a time for functional activities due to impaired activity tolerance/endurance.  Gait training not functional at this time.  Pt did reach supervision for w/c mobility with hemi technique.  Patient continues to demonstrate the following deficits: L hemiplegia, impaired activity tolerance/endurance, impaired ability to follow sternal precautions, impaired postural control, balance, gait and therefore will continue to benefit from skilled PT intervention to enhance overall performance with activity tolerance, balance, postural control, ability to compensate for deficits, functional use of  left upper extremity and left lower extremity and knowledge of precautions.  Patient progressing toward long term goals..  Continue plan of care.  PT Short Term Goals Week 1:  PT Short Term Goal 1 (Week 1): Patient will perform bed mobility consistent ly with mod A  PT Short Term Goal 1 - Progress (Week 1): Not met PT Short Term Goal 2 (Week 1): Patient will propell WC 138f in controlled environment with min A.  PT Short Term Goal 2 - Progress (Week 1): Met PT Short Term Goal 3 (Week 1): Patient will ambulate 134fwith LRAD and max A from PT.  PT Short Term Goal 3 - Progress (Week 1): Not met PT Short Term Goal 4 (Week 1): Patient will perform stedy transfers with mod A.  PT Short Term Goal 4 - Progress (Week 1): Not met Week 2:  PT Short Term Goal 1 (Week 2): Pt will perform bed mobility with consistent mod A PT Short Term Goal 2 (Week 2): Pt will perform bed <> chair transfers  with mod A and maintain sternal precautions PT Short Term Goal 3 (Week 2): Pt will tolerate standing x 5-8 minutes at time for standing balance and gait training activities with mod A PT Short Term Goal 4 (Week 2): Pt will perform ambulation with appropriate AD x 15' with max A  Therapy Documentation Precautions:  Precautions Precautions: Fall, Sternal Precaution Comments: L hemiparesis; L neglect; L subluxed shoulder Required Braces or Orthoses: Other Brace/Splint (left shoulder taped) Restrictions Weight Bearing Restrictions: Yes LLE Weight Bearing: Weight bearing as tolerated Other Position/Activity Restrictions: sternal precautions Pain: Pain Assessment Pain Score: 2   See Function Navigator for Current Functional Status.  HaRaylene Evertsaucette 05/20/2016, 12:33 PM

## 2016-05-20 NOTE — Progress Notes (Signed)
PATIENT ID: [name] is a 36F s/p mitral valve repair 04/30/16, diabetes mellitus type 2, hypertension, and chronic diastolic heart failure in inpatient rehab due to [facility] with residual L sided weakness.  She developed post-operative atrial fibrillation with RVR.   SUBJECTIVE:  Feeling well.  She denies chest pain or shortness of breath.  She notes mild dizziness that improves with closing her eyes.    PHYSICAL EXAM Filed Vitals:   05/19/16 0440 05/19/16 0443 05/19/16 1441 05/20/16 0500  BP: 120/63  120/66 108/57  Pulse: 63  51 51  Temp: 98.4 F (36.9 C)  98.1 F (36.7 C) 98.4 F (36.9 C)  TempSrc: Oral  Oral Oral  Resp: 18  18 17   Weight:  232 lb 9.6 oz (105.507 kg)  234 lb 7 oz (106.34 kg)  SpO2: 98%  100% 95%   General:  Well-appearing.  Neck: No JVD Lungs:  CTAB Heart:  Bradycardic.  Regular rate  No m/r/g.   Abdomen:  Soft, NT, ND.  +BS Extremities:  WWP.  No edema   LABS: Lab Results  Component Value Date   TROPONINI 0.05* 02/05/2016   Results for orders placed or performed during the hospital encounter of 05/12/16 (from the past 24 hour(s))  Glucose, capillary     Status: Abnormal   Collection Time: 05/19/16 12:16 PM  Result Value Ref Range   Glucose-Capillary 137 (H) 65 - 99 mg/dL  Glucose, capillary     Status: Abnormal   Collection Time: 05/19/16  4:29 PM  Result Value Ref Range   Glucose-Capillary 114 (H) 65 - 99 mg/dL  Glucose, capillary     Status: Abnormal   Collection Time: 05/19/16  8:53 PM  Result Value Ref Range   Glucose-Capillary 168 (H) 65 - 99 mg/dL  Glucose, capillary     Status: Abnormal   Collection Time: 05/20/16  6:39 AM  Result Value Ref Range   Glucose-Capillary 136 (H) 65 - 99 mg/dL  Protime-INR     Status: Abnormal   Collection Time: 05/20/16  7:00 AM  Result Value Ref Range   Prothrombin Time 32.3 (H) 11.6 - 15.2 seconds   INR 3.23 (H) 0.00 - 1.49    Intake/Output Summary (Last 24 hours) at 05/20/16 1136 Last data filed at  05/20/16 0900  Gross per 24 hour  Intake    600 ml  Output      0 ml  Net    600 ml    ASSESSMENT AND PLAN:  Principal Problem:   Cerebrovascular accident (CVA) due to occlusion of right carotid artery ([place]) Active Problems:   COPD (chronic obstructive pulmonary disease) ([place])   Essential hypertension   Acute on chronic diastolic congestive heart failure due to valvular disease ([place])   Debilitated   Left hemiparesis ([place])   Type 2 diabetes mellitus with peripheral neuropathy (H[facility])   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  Echo reveals a normally functioning valve.    # Chronic diastolic heart failure:  She is euvolemic on exam.  Continue carvedilol, spironolactone and lasix 40 mg bid.  # Atrial fibrillation: She is now in sinus rhythm.  Continue amiodarone and carvedilol.  Continue warfarin.  This patients CHA2DS2-VASc Score and unadjusted Ischemic Stroke Rate (% per year) is equal to 4.8 % stroke rate/year from a score of 4  Above score calculated as 1 point each if present [CHF, HTN, DM, Vascular=MI/PAD/Aortic Plaque, Age if 65-74, or Female] Above score calculated as 2 points each if present [Age > 75, or Stroke/TIA/TE]  We will sign off.  Follow up is scheduled 05/28/16.  Please call if there are questions.     Joden Bonsall C. Oval Linsey, MD, [facility] 05/20/2016 11:36 AM

## 2016-05-20 NOTE — Progress Notes (Signed)
Nutrition Follow-up  DOCUMENTATION CODES:   Obesity unspecified  INTERVENTION:  Continue Glucerna Shake po BID, each supplement provides 220 kcal and 10 grams of protein.  Encourage adequate PO intake.   NUTRITION DIAGNOSIS:   Increased nutrient needs related to chronic illness as evidenced by estimated needs; ongoing  GOAL:   Patient will meet greater than or equal to 90% of their needs; met  MONITOR:   PO intake, Supplement acceptance, Weight trends, Labs, I & O's  REASON FOR ASSESSMENT:   Malnutrition Screening Tool    ASSESSMENT:   60 y.o. 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 Presented 04/23/2016 plan mitral valve surgery and underwent mitral valve repair 04/30/2016 Presents with debility after mitral valve repair, h/o r CVA.  Meal completion has been 75-100%. Pt reports having a good appetite. Pt currently has Glucerna Shake ordered and has been consuming them. Weight has been trending up. Noted lasix has been increased. Recent weight increased likely related to fluid status. RD to continue to monitor. Pt requests diet education. Pt reports discharge date 7/13. RD to educate on diet closer to discharge date.   Diet Order:  Diet Carb Modified Fluid consistency:: Thin; Room service appropriate?: Yes  Skin:  Reviewed, no issues  Last BM:  6/27  Height:   Ht Readings from Last 1 Encounters:  04/30/16 '5\' 5"'$  (1.651 m)    Weight:   Wt Readings from Last 1 Encounters:  05/20/16 234 lb 7 oz (106.34 kg)    Ideal Body Weight:  56.8 kg  BMI:  Body mass index is 39.01  kg/(m^2).  Estimated Nutritional Needs:   Kcal:  1800-1950  Protein:  95-105 grams  Fluid:  Per MD  EDUCATION NEEDS:   No education needs identified at this time  Corrin Parker, MS, RD, LDN Pager # [phone] After hours/ weekend pager # [phone]

## 2016-05-20 NOTE — Progress Notes (Signed)
Physical Therapy Session Note  Patient Details  Name: Margaret [name] MRN: [id number] Date of Birth: [birth year]  Today's Date: 05/20/2016 PT Individual Time: 1400-1510 (20 minutes make up time) PT Individual Time Calculation (min): 70 min   Short Term Goals: Week 1:  PT Short Term Goal 1 (Week 1): Patient will perform bed mobility consistent ly with mod A  PT Short Term Goal 1 - Progress (Week 1): Not met PT Short Term Goal 2 (Week 1): Patient will propell WC 135ft in controlled environment with min A.  PT Short Term Goal 2 - Progress (Week 1): Met PT Short Term Goal 3 (Week 1): Patient will ambulate 43ft with LRAD and max A from PT.  PT Short Term Goal 3 - Progress (Week 1): Not met PT Short Term Goal 4 (Week 1): Patient will perform stedy transfers with mod A.  PT Short Term Goal 4 - Progress (Week 1): Not met   Skilled Therapeutic Interventions/Progress Updates:  Pt received in w/c on RA; pt reports the team is attempting to wean her off supplemental 02.  Pt Sp02 98% seated at rest on RA.  Pt reporting she had the urge to urinate but was unable to hold it until help arrived and had incontinent episode in her brief but may need to void more on toilet.  Pt performed sit > stand from w/c with UE support on Stedy and +2 to stand from lower surface.  Transferred to toilet in [place].  Performed multiple sit <> stands from [place] with min-mod A and able to stand with min A while therapist and RN assisted with hygiene and clothing management.  Pt transferred to EOB for vestibular evaluation.  Husband utilized for repositioning during vestibular evaluation with pt requiring +2 for sit > supine, max A for rolling on flat bed, no rails and max A for supine > sit at end of eval.  Pt requesting to transfer into w/c without Stedy.  Pt performed stand pivot with UE support on arm rest of w/c with max A to shift COG fully anterior over BOS during sit > stand and to stabilize LLE during pivot.  Pt  positioned in w/c at end of session with all items within reach.  Once positioned pt and husband with questions about equipment needs and f/u therapy at D/C.  Discussed w/c width-due to width of doorways at home pt would benefit from 20 or 22 width manual w/c with L hand rim removed for improved clearance.  Deferred all other equipment recommendations to primary team.     1. History and Physical Examination    Pt reports first episode of dizziness the day of her CVA.  Since that day pt has had intermittent episodes of room spinning dizziness (vertigo) with rolling and supine <> sit that lasts for a few seconds.  Pt denies dizziness with sit <> stand, standing or with gait.  Pt denies nausea, changes in hearing, diplopia but does report some blurring of near vision and inability to read small print.  Pt also reports intermittent headaches.  2. Vestibular Assessment                           Gross neck ROM WFL  Eye Alignment WFL  Oculomotor ROM WFL  Spontaneous  Nystagmus (room light and vision occluded) None noted in room light  Gaze holding nystagmus(room light and vision occluded) None noted in room light  Smooth pursuit Impaired  Saccades  WFL  Vergence Impaired  VOR Cancellation WFL  Pressure Tests (vision occluded) N/T  VOR slow WFL  Head Thrust Test negative  Head Shaking Test (vision occluded) N/T  Dynamic Visual Acuity         N/T  Rt. Hallpike Dix Negative in modified position**  Lt. Hallpike Dix Negative in modified position**  Rt. Roll Test negative  Lt. Roll Test  Mild reports of vertigo but no nystagmus noted  MSQ N/T  Cover-Cross Cover (if indicated) N/T  Head-Neck Differentiation Test (if indicated) N/T   **Due to sternal precautions Hallpike tested with pt in supine, head rotated to L or R and bed placed in Trendelenburg position  3. Findings:  Patient signs and symptoms unable to be elicited with positional testing but unable to fully rule out peripheral causes due to  testing in modified positions.  Pt does have symptoms consistent with impaired visual-vestibular integration, impaired convergence and possible motion sensitivity.    4. Recommendations for Treatment: Nothing at this time; monitor for re-occurrence of symptoms and alert therapist if symptoms return or become worse.     Therapy Documentation Precautions:  Precautions Precautions: Fall, Sternal Precaution Comments: L hemiparesis; L neglect; L subluxed shoulder Required Braces or Orthoses: Other Brace/Splint (left shoulder taped) Restrictions Weight Bearing Restrictions: Yes LLE Weight Bearing: Weight bearing as tolerated Other Position/Activity Restrictions: sternal precautions Pain: Pain Assessment Pain Assessment: No/denies pain  See Function Navigator for Current Functional Status.   Therapy/Group: Individual Therapy  Raylene Everts [facility] 05/20/2016, 3:36 PM

## 2016-05-20 NOTE — Progress Notes (Signed)
[place] PHYSICAL MEDICINE & REHABILITATION     PROGRESS NOTE  Subjective/Complaints:  Pt sitting up in bed.  She is awake this AM.  States she is doing well.  ROS: Denies CP, SOB, nausea, vomiting, diarrhea.  Objective: Vital Signs: Blood pressure 108/57, pulse 51, temperature 98.4 F (36.9 C), temperature source Oral, resp. rate 17, weight 106.34 kg (234 lb 7 oz), SpO2 95 %. No results found.  Recent Labs  05/19/16 0642  WBC 11.0*  HGB 11.0*  HCT 38.1  PLT 683*    Recent Labs  05/19/16 0642  NA 137  K 4.4  CL 94*  GLUCOSE 125*  BUN 15  CREATININE 1.07*  CALCIUM 10.7*   CBG (last 3)   Recent Labs  05/19/16 1629 05/19/16 2053 05/20/16 0639  GLUCAP 114* 168* 136*    Wt Readings from Last 3 Encounters:  05/20/16 106.34 kg (234 lb 7 oz)  05/12/16 105.008 kg (231 lb 8 oz)  04/14/16 117.935 kg (260 lb)    Physical Exam:  BP 108/57 mmHg  Pulse 51  Temp(Src) 98.4 F (36.9 C) (Oral)  Resp 17  Wt 106.34 kg (234 lb 7 oz)  SpO2 95% Constitutional: She appears well-developed. Obese  HENT: Normocephalic and atraumatic.  Eyes: EOM are normal. Right eye exhibits no discharge. Left eye exhibits no discharge.  Cardiovascular: Regular Rate and rhythm. Respiratory: No respiratory distress. Effort normal.  GI: Soft. Bowel sounds are normal.  Musculoskeletal: She exhibits edema. She exhibits no tenderness.  Neurological: She is alert. A cranial nerve deficit is present.  Left facial weakness  Severe dysarthria but intelligible.  Follows simple commands. Has reasonable insight and awareness. Memory fair. Delayed problem solving RUE/RLE: 4+/5 proximal to distal LUE 0/5 proximal to distal.  LLE: 2/5 proximal to distal. mAS 1/4 Left elbow extension Skin: Chest incision clean dry. Vascular changes bilateral lower extremities  Psychiatric: Her affect is blunt and flat. Her speech is delayed. She is slowed.    Assessment/Plan: 1. Functional deficits secondary  to mitral valve repair 04/30/2016 as well as recent history of subarachnoid hemorrhage with left-sided residual weakness and dysarthria which require 3+ hours per day of interdisciplinary therapy in a comprehensive inpatient rehab setting. Physiatrist is providing close team supervision and 24 hour management of active medical problems listed below. Physiatrist and rehab team continue to assess barriers to discharge/monitor patient progress toward functional and medical goals.  Function:  Bathing Bathing position   Position: Shower  Bathing parts Body parts bathed by patient: Chest, Abdomen, Front perineal area, Right upper leg, Left upper leg Body parts bathed by helper: Left lower leg, Right lower leg, Back, Buttocks, Left arm, Right arm  Bathing assist Assist Level: Touching or steadying assistance(Pt > 75%) (patient completed 50%, moderate assist)      Upper Body Dressing/Undressing Upper body dressing   What is the patient wearing?: Pull over shirt/dress Bra - Perfomed by patient: Thread/unthread right bra strap, Thread/unthread left bra strap, Hook/unhook bra (pull down sports bra)   Pull over shirt/dress - Perfomed by patient: Thread/unthread right sleeve, Thread/unthread left sleeve, Put head through opening, Pull shirt over trunk          Upper body assist Assist Level: Touching or steadying assistance(Pt > 75%)      Lower Body Dressing/Undressing Lower body dressing   What is the patient wearing?: Socks, Shoes, Pants       Pants- Performed by helper: Thread/unthread right pants leg, Pull pants up/down, Thread/unthread left pants  leg       Socks - Performed by helper: Don/doff right sock, Don/doff left sock   Shoes - Performed by helper: Don/doff right shoe, Don/doff left shoe, Fasten right, Fasten left          Lower body assist Assist for lower body dressing: Touching or steadying assistance (Pt > 75%)      Toileting Toileting Toileting activity did not  occur: No continent bowel/bladder event   Toileting steps completed by helper: Adjust clothing prior to toileting, Performs perineal hygiene, Adjust clothing after toileting    Toileting assist Assist level: Two helpers   Transfers Chair/bed transfer   Chair/bed transfer method: Stand pivot Chair/bed transfer assist level: 2 helpers Chair/bed transfer assistive device: Mechanical lift Mechanical lift: Ecologist     Max distance: 58ft Assist level: Maximal assist (Pt 25 - 49%)   Wheelchair   Type: Manual Max wheelchair distance: 30 Assist Level: Supervision or verbal cues  Cognition Comprehension Comprehension assist level: Understands basic 90% of the time/cues < 10% of the time  Expression Expression assist level: Expresses basic needs/ideas: With no assist  Social Interaction Social Interaction assist level: Interacts appropriately 75 - 89% of the time - Needs redirection for appropriate language or to initiate interaction.  Problem Solving Problem solving assist level: Solves basic 75 - 89% of the time/requires cueing 10 - 24% of the time  Memory Memory assist level: Recognizes or recalls 75 - 89% of the time/requires cueing 10 - 24% of the time    Medical Problem List and Plan: 1. 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 Sternal precautions after mitral valve repair  Continue CIR  PRAFO, hand splints   Extensor tone in LUE, however, this appears to be functional at present, will consider treatment if necessary 2. DVT Prophylaxis/Anticoagulation: Coumadin therapy after mitral valve repair  INR Therapeutic today. Pharmacy protocol 3. Pain Management: Oxycodone as needed 4. Mood/panic attacks: Lexapro 10 mg daily at bedtime, trazodone 100 mg daily at bedtime, Klonopin 0.5 mg daily at bedtime and 3 times a day as needed 5.  Neuropsych: This patient is not fully capable of making decisions on her own behalf. 6. Skin/Wound Care: Routine skin checks 7. Fluids/Electrolytes/Nutrition: Routine I&O  8. Dysphagia. Dysphagia #3 thin liquids. Follow speech therapy 9. PAF with RVR. Continue amiodarone, Aldactone 50 mg daily, Tenormin 12.5 mg twice a day. Follow-up cardiology services 10. Acute on chronic diastolic congestive heart failure.   Lasix 80 mg daily, increased to 60 BID on 6/22 .  daily weights  Monitor for any signs of fluid overload Filed Weights   05/18/16 0500 05/19/16 0443 05/20/16 0500  Weight: 105.461 kg (232 lb 8 oz) 105.507 kg (232 lb 9.6 oz) 106.34 kg (234 lb 7 oz)  11. Diabetes mellitus with peripheral neuropathy. Check blood sugars before meals and at bedtime. Continue Levemir 28 units daily. Diabetic teaching  Fairly well controlled  Continue to monitor 12. Leukocytosis: Trending down  Currently afebrile  WBCs 11.0 on 6/27  UA negative, U culture with multiple species  Chest x-ray from 6/21 showing interstitial edema and? Infiltrate/atelectasis (appears to have slightly improved from previous x-ray) 13. AKI  Creatinine 1.07 on 6/27 (stable)  Encourage fluid intake 14. Constipation: Improved 15. ABLA  Hb 11.0 on 6/27  Cont to monitor  LOS (Days) 8 A FACE TO FACE EVALUATION WAS PERFORMED  Dannetta Lekas Lorie Phenix  05/20/2016 8:56 AM

## 2016-05-20 NOTE — Progress Notes (Signed)
ANTICOAGULATION CONSULT NOTE - Follow Up Consult  Pharmacy Consult for coumadin Indication: MVR  Allergies  Allergen Reactions  . Codeine Hives, Itching and Other (See Comments)    "breathing problems"    Patient Measurements: Weight: 234 lb 7 oz (106.34 kg)  Vital Signs: Temp: 98.4 F (36.9 C) (06/28 0500) Temp Source: Oral (06/28 0500) BP: 108/57 mmHg (06/28 0500) Pulse Rate: 51 (06/28 0500)  Labs:  Recent Labs  05/18/16 0950 05/19/16 0642 05/20/16 0700  HGB  --  11.0*  --   HCT  --  38.1  --   PLT  --  683*  --   LABPROT 31.9* 30.5* 32.3*  INR 3.17* 2.98* 3.23*  CREATININE  --  1.07*  --     Estimated Creatinine Clearance: 67.7 mL/min (by C-G formula based on Cr of 1.07).  Assessment: 59 yo female on warfarin for MVR - Goal 2.5-3.5 per CVTS d/c summary on 6/16. INR remains therapeutic at 2.23 today. No overt bleeding noted. Continue on amiodarone dose decreased from 400 mg BID to 200 mg daily on 6/26.   Goal of Therapy:  INR 2.5-3.5 Monitor platelets by anticoagulation protocol: Yes   Plan:  Coumadin 2.5 mg po x1 Daily INR Monitor CBC, s/sx bleeding, DI with amiodarone  Maryanna Shape, PharmD, BCPS  Clinical Pharmacist  Pager: [phone]  12:29 PM, 05/20/2016

## 2016-05-20 NOTE — Progress Notes (Signed)
Occupational Therapy Weekly Progress Note & Session Note  Patient Details  Name: Margaret [name] MRN: [id number] Date of Birth: 1956-08-02  SESSION NOTE   Today's Date: 05/20/2016 OT Individual Time: 1020-1130 OT Individual Time Calculation (min): 70 min  Individual therapy Pt found supine in bed asleep, pt easy to awake but a little difficult to arouse. Pt with no complaints of pain. Pt stated she felt like she had an accident in her brief. Pt engaged in bed mobility with min assist for therapist to check and pt with start of BM, therefore therapist placed bed pan under patient. Pt reports that she has family that can give/loan her a BSC and tub transfer bench, therapist asked for family to take a picture of the equipment to make sure it will work for her. While supine in bed, engaged pt in LB bathing & dressing. Pt then sat EOB with max assist (HOB flat). Pt sat EOB with distant supervision to perform UB bathing & dressing (min assist for this). Pt then transferred EOB to w/c (to left side) with mod assist. Set-up leg rests and pt then sat at sink for grooming tasks. At end of session, left pt seated in w/c with all needs within reach.   ----------------------------------------------------------------------------------------------------------------------  WEEKLY NOTE  Beginning of progress report period: May 13, 2016 End of progress report period: May 20, 2016  Patient has met 3 of 4 short term goals. Pt continues to require up to max assist for bed mobility due to hemiplegia and sternal precautions.  Pt making progress towards OT goals, continue plan of care for now. Discussed STGs and LTGs with patient and discussed need for husband to be present for family education prior to her discharging home, discussed dates of July 10-12 (all day, each day). Also discussed showers vs sponge bathing and pt reported she felt okay and safer performing sponge baths at home; will continue to assess  which will be best closer to discharge.   Patient continues to demonstrate the following deficits: decreased ADL independence, decreased functional ambulation/mobility/transfer independence, decreased functional use of LUE,  and therefore will continue to benefit from skilled OT intervention to enhance overall performance with BADL, iADL and Reduce care partner burden.  Patient progressing toward long term goals..  Continue plan of care.  OT Short Term Goals Week 1:  OT Short Term Goal 1 (Week 1): Pt will be mod I with bed mobility with HOB down as a precursor for ADLs  OT Short Term Goal 1 - Progress (Week 1): Progressing toward goal OT Short Term Goal 2 (Week 1): Pt will complete UB bathing and dressing seated EOB unsupported with moderate assistance  OT Short Term Goal 2 - Progress (Week 1): Met OT Short Term Goal 3 (Week 1): Pt will perform toilet transfer with mod assist  OT Short Term Goal 3 - Progress (Week 1): Met OT Short Term Goal 4 (Week 1): Pt will independently verbalize and adhere to sternal precautions 100% of the time OT Short Term Goal 4 - Progress (Week 1): Met   Week 2:  OT Short Term Goal 1 (Week 2): Pt will be educated on tub/shower transfers using tub transfer bench OT Short Term Goal 2 (Week 2): Pt will be min assist with stand pivot transfer w/c to/from [facility] OT Short Term Goal 3 (Week 2): Pt will be educated on a LUE HEP  OT Short Term Goal 4 (Week 2): Pt will perform sit to/from stands with min assist as a  precursor for ADLs   Skilled Therapeutic Interventions/Progress Updates:  Cognitive remediation/compensation;Community reintegration;Discharge planning;Disease mangement/prevention;DME/adaptive equipment instruction;Neuromuscular re-education;Pain management;Patient/family education;Psychosocial support;Self Care/advanced ADL retraining;Skin care/wound managment;Splinting/orthotics;Therapeutic Activities;Therapeutic Exercise;UE/LE Strength taining/ROM;UE/LE  Coordination activities;Wheelchair propulsion/positioning   Therapy Documentation Precautions:  Precautions Precautions: Fall, Sternal Precaution Comments: L hemiparesis; L neglect; L subluxed shoulder Required Braces or Orthoses: Other Brace/Splint (left shoulder taped) Restrictions Weight Bearing Restrictions: Yes LLE Weight Bearing: Weight bearing as tolerated Other Position/Activity Restrictions: sternal precautions  Vital Signs: Therapy Vitals Temp: 98.4 F (36.9 C) Temp Source: Oral Pulse Rate: (!) 51 Resp: 17 BP: (!) 108/57 mmHg Patient Position (if appropriate): Lying Oxygen Therapy SpO2: 95 % O2 Device: Nasal Cannula O2 Flow Rate (L/min): 1 L/min  See Function Navigator for Current Functional Status.  Chrys Racer , MS, OTR/L, CLT  05/20/2016, 12:39 PM

## 2016-05-21 ENCOUNTER — Inpatient Hospital Stay (HOSPITAL_COMMUNITY): Payer: Medicaid Other | Admitting: Speech Pathology

## 2016-05-21 ENCOUNTER — Inpatient Hospital Stay (HOSPITAL_COMMUNITY): Payer: Medicaid Other | Admitting: Physical Therapy

## 2016-05-21 ENCOUNTER — Inpatient Hospital Stay (HOSPITAL_COMMUNITY): Payer: Self-pay | Admitting: Occupational Therapy

## 2016-05-21 LAB — GLUCOSE, CAPILLARY
GLUCOSE-CAPILLARY: 167 mg/dL — AB (ref 65–99)
Glucose-Capillary: 110 mg/dL — ABNORMAL HIGH (ref 65–99)
Glucose-Capillary: 216 mg/dL — ABNORMAL HIGH (ref 65–99)
Glucose-Capillary: 123 mg/dL — ABNORMAL HIGH (ref 65–99)

## 2016-05-21 LAB — PROTIME-INR
INR: 3.07 — AB (ref 0.00–1.49)
PROTHROMBIN TIME: 31.2 s — AB (ref 11.6–15.2)

## 2016-05-21 MED ORDER — WARFARIN SODIUM 2.5 MG PO TABS: 2.5000 mg | ORAL_TABLET | ORAL | Status: DC

## 2016-05-21 MED ORDER — WARFARIN SODIUM 5 MG PO TABS
5.0000 mg | ORAL_TABLET | ORAL | Status: DC
  Administered 2016-05-21: 5 mg via ORAL
  Filled 2016-05-21: qty 1

## 2016-05-21 NOTE — Progress Notes (Signed)
Speech Language Pathology Daily Session Note  Patient Details  Name: Margaret [name] MRN: [id number] Date of Birth: 09-12-1956  Today's Date: 05/21/2016 SLP Individual Time: 1400-1445 SLP Individual Time Calculation (min): 45 min  Short Term Goals: Week 2: SLP Short Term Goal 1 (Week 2): Pt will consume regular textures and thin liquids during a meal with mod I use of swallowing precautions to clear residuals from the oral cavity post swallow.  SLP Short Term Goal 2 (Week 2): Patient will utilize speech intelligibility strategies at the conversational level with mod I.   SLP Short Term Goal 3 (Week 2): Patient will utilize external aids to assist with recall of new, daily information with supervision verbal cues  SLP Short Term Goal 4 (Week 2): Patient will solve problems during completion of familiar tasks with mod I to self-monitor and correct errors for 100% accuracy.   Skilled Therapeutic Interventions:  Functional money counting completed with 100% acc. Moderate level math reasoning word problems completed with 90% acc and increased time. Pt completed deductive reasoning task with occasional min A. Sustained attention for 45 min session without cueing.    Function:  Eating Eating                 Cognition Comprehension Comprehension assist level: Follows basic conversation/direction with no assist  Expression   Expression assist level: Expresses complex 90% of the time/cues < 10% of the time  Social Interaction Social Interaction assist level: Interacts appropriately with others - No medications needed.  Problem Solving Problem solving assist level: Solves basic problems with no assist  Memory Memory assist level: Recognizes or recalls 75 - 89% of the time/requires cueing 10 - 24% of the time    Pain Pain Assessment Pain Assessment: No/denies pain  Therapy/Group: Individual Therapy  Vinetta Bergamo MA, CCC-SLP 05/21/2016, 4:03 PM

## 2016-05-21 NOTE — Progress Notes (Signed)
Occupational Therapy Session Note  Patient Details  Name: Margaret [name] MRN: [id number] Date of Birth: 12-Jan-1956  Today's Date: 05/21/2016 OT Individual Time: 1300-1400 OT Individual Time Calculation (min): 60 min    Short Term Goals: Week 1:  OT Short Term Goal 1 (Week 1): Pt will be mod I with bed mobility with HOB down as a precursor for ADLs  OT Short Term Goal 1 - Progress (Week 1): Progressing toward goal OT Short Term Goal 2 (Week 1): Pt will complete UB bathing and dressing seated EOB unsupported with moderate assistance  OT Short Term Goal 2 - Progress (Week 1): Met OT Short Term Goal 3 (Week 1): Pt will perform toilet transfer with mod assist  OT Short Term Goal 3 - Progress (Week 1): Met OT Short Term Goal 4 (Week 1): Pt will independently verbalize and adhere to sternal precautions 100% of the time OT Short Term Goal 4 - Progress (Week 1): Met Week 2:  OT Short Term Goal 1 (Week 2): Pt will be educated on tub/shower transfers using tub transfer bench OT Short Term Goal 2 (Week 2): Pt will be min assist with stand pivot transfer w/c to/from [facility] OT Short Term Goal 3 (Week 2): Pt will be educated on a LUE HEP  OT Short Term Goal 4 (Week 2): Pt will perform sit to/from stands with min assist as a precursor for ADLs   Skilled Therapeutic Interventions/Progress Updates:    1:1 Pt getting off toilet when arrived. Self care retraining at shower level with focus on utilizing hemi strategies for bathing and dressing with mod VC for sequence. Pt did transition in and out of the shower with the STEDY Also focus on maintaining forward weight shift with sit to stands from [facility] and w/c with mod A. Pt able to extend and maintain position of her left Le to assist with threading pants. Pt able to maintain standing balance with min A using hemi walker for right UE support. Focus on weight shifts to midline with left Le activation to maintain balance. Pt required max VC and mod A for  controlled descents from standing to sitting. Pt with increased tolerance to activity today and able to tolerate activity on RA with decr requests for rest breaks.   Therapy Documentation Precautions:  Precautions Precautions: Fall, Sternal Precaution Comments: L hemiparesis; L neglect; L subluxed shoulder Required Braces or Orthoses: Other Brace/Splint (left shoulder taped) Restrictions Weight Bearing Restrictions: Yes LLE Weight Bearing: Weight bearing as tolerated Other Position/Activity Restrictions: sternal precautions General:  Pain:  no c/o pain in session     See Function Navigator for Current Functional Status.   Therapy/Group: Individual Therapy  Willeen Cass [facility] 05/21/2016, 3:57 PM

## 2016-05-21 NOTE — Progress Notes (Signed)
[place] PHYSICAL MEDICINE & REHABILITATION     PROGRESS NOTE  Subjective/Complaints:  Pt sitting up in her chair this AM.  She states she slept well and is up early this AM.  She states therapies are "interesting".  ROS: Denies CP, SOB, nausea, vomiting, diarrhea.  Objective: Vital Signs: Blood pressure 160/70, pulse 52, temperature 98.1 F (36.7 C), temperature source Oral, resp. rate 18, weight 106.2 kg (234 lb 2.1 oz), SpO2 97 %. No results found.  Recent Labs  05/19/16 0642  WBC 11.0*  HGB 11.0*  HCT 38.1  PLT 683*    Recent Labs  05/19/16 0642  NA 137  K 4.4  CL 94*  GLUCOSE 125*  BUN 15  CREATININE 1.07*  CALCIUM 10.7*   CBG (last 3)   Recent Labs  05/20/16 1626 05/20/16 2057 05/21/16 0645  GLUCAP 150* 179* 110*    Wt Readings from Last 3 Encounters:  05/21/16 106.2 kg (234 lb 2.1 oz)  05/12/16 105.008 kg (231 lb 8 oz)  04/14/16 117.935 kg (260 lb)    Physical Exam:  BP 160/70 mmHg  Pulse 52  Temp(Src) 98.1 F (36.7 C) (Oral)  Resp 18  Wt 106.2 kg (234 lb 2.1 oz)  SpO2 97% Constitutional: She appears well-developed. Obese  HENT: Normocephalic and atraumatic.  Eyes: EOM are normal. Right eye exhibits no discharge. Left eye exhibits no discharge.  Cardiovascular: Regular Rate and rhythm. Respiratory: No respiratory distress. Effort normal.  GI: Soft. Bowel sounds are normal.  Musculoskeletal: She exhibits edema. She exhibits no tenderness.  Neurological: She is alert. A cranial nerve deficit is present.  Left facial weakness  Dysarthria but intelligible.  Follows simple commands. Has reasonable insight and awareness. Memory fair. RUE/RLE: 4+/5 proximal to distal LUE 0/5 proximal to distal.  LLE: 2/5 proximal to distal. mAS 1/4 Left elbow extension Skin: Chest incision clean dry. Vascular changes bilateral lower extremities  Psychiatric: Her affect is blunt and flat. Her speech is delayed. She is slowed.    Assessment/Plan: 1.  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 Physiatrist is providing close team supervision and 24 hour management of active medical problems listed below. Physiatrist and rehab team continue to assess barriers to discharge/monitor patient progress toward functional and medical goals.  Function:  Bathing Bathing position   Position: Sitting EOB  Bathing parts Body parts bathed by patient: Right arm, Left arm, Chest, Abdomen, Front perineal area (right arm with hand over hand assist) Body parts bathed by helper: Buttocks, Right upper leg, Left upper leg, Right lower leg, Left lower leg, Back  Bathing assist Assist Level: Touching or steadying assistance(Pt > 75%) (mod assist)      Upper Body Dressing/Undressing Upper body dressing   What is the patient wearing?: Pull over shirt/dress Bra - Perfomed by patient: Thread/unthread right bra strap, Thread/unthread left bra strap, Hook/unhook bra (pull down sports bra)   Pull over shirt/dress - Perfomed by patient: Thread/unthread right sleeve, Thread/unthread left sleeve, Put head through opening, Pull shirt over trunk          Upper body assist Assist Level: Touching or steadying assistance(Pt > 75%) (increased time)      Lower Body Dressing/Undressing Lower body dressing   What is the patient wearing?: Socks, Shoes, Pants       Pants- Performed  by helper: Thread/unthread right pants leg, Pull pants up/down, Thread/unthread left pants leg       Socks - Performed by helper: Don/doff right sock, Don/doff left sock   Shoes - Performed by helper: Don/doff right shoe, Don/doff left shoe, Fasten right, Fasten left          Lower body assist Assist for lower body dressing: Touching or steadying assistance (Pt >  75%) (max to total assist)      Toileting Toileting Toileting activity did not occur: No continent bowel/bladder event Toileting steps completed by patient: Adjust clothing after toileting Toileting steps completed by helper: Adjust clothing prior to toileting, Performs perineal hygiene, Adjust clothing after toileting    Toileting assist Assist level: Two helpers   Transfers Chair/bed transfer   Chair/bed transfer method: Stand pivot Chair/bed transfer assist level: Maximal assist (Pt 25 - 49%/lift and lower) Chair/bed transfer assistive device: Armrests Mechanical lift: Ecologist     Max distance: 38ft Assist level: Maximal assist (Pt 25 - 49%)   Wheelchair   Type: Manual Max wheelchair distance: 30 Assist Level: Supervision or verbal cues  Cognition Comprehension Comprehension assist level: Understands basic 90% of the time/cues < 10% of the time  Expression Expression assist level: Expresses basic 90% of the time/requires cueing < 10% of the time.  Social Interaction Social Interaction assist level: Interacts appropriately with others - No medications needed.  Problem Solving Problem solving assist level: Solves basic 90% of the time/requires cueing < 10% of the time  Memory Memory assist level: Recognizes or recalls 75 - 89% of the time/requires cueing 10 - 24% of the time    Medical Problem List and Plan: 1. 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 Sternal precautions after mitral valve repair  Continue CIR  PRAFO, hand splints   Extensor tone in LUE, however, this appears to be functional at present, will consider treatment if necessary 2. DVT Prophylaxis/Anticoagulation: Coumadin therapy after mitral valve repair  INR Therapeutic today. Pharmacy protocol 3. Pain Management: Oxycodone as needed 4. Mood/panic attacks:  Lexapro 10 mg daily at bedtime, trazodone 100 mg daily at bedtime, Klonopin 0.5 mg daily at bedtime and 3 times a day as needed 5. Neuropsych: This patient is not fully capable of making decisions on her own behalf. 6. Skin/Wound Care: Routine skin checks 7. Fluids/Electrolytes/Nutrition: Routine I&O  8. Dysphagia. Dysphagia #3 thin liquids. Follow speech therapy 9. PAF with RVR. Continue amiodarone, Aldactone 50 mg daily, Tenormin 12.5 mg twice a day. Appreciate Cards recs, signed off.  10. Acute on chronic diastolic congestive heart failure.   Lasix 80 mg daily, increased to 60 BID on 6/22 .  daily weights  Monitor for any signs of fluid overload Filed Weights   05/19/16 0443 05/20/16 0500 05/21/16 0541  Weight: 105.507 kg (232 lb 9.6 oz) 106.34 kg (234 lb 7 oz) 106.2 kg (234 lb 2.1 oz)  11. Diabetes mellitus with peripheral neuropathy. Check blood sugars before meals and at bedtime. Continue Levemir 28 units daily. Diabetic teaching  Fairly well controlled  Continue to monitor 12. Leukocytosis: Trending down  Currently afebrile  WBCs 11.0 on 6/27  UA negative, U culture with multiple species  Chest x-ray from 6/21 showing interstitial edema and? Infiltrate/atelectasis (appears to have slightly improved from previous x-ray) 13. AKI  Creatinine 1.07 on 6/27 (stable)  Encourage fluid intake 14. Constipation: Improved 15. ABLA  Hb 11.0 on 6/27  Cont to monitor  LOS (Days) 9 A FACE TO FACE EVALUATION WAS PERFORMED  Talisha Erby Lorie Phenix 05/21/2016 9:10 AM

## 2016-05-21 NOTE — Progress Notes (Signed)
ANTICOAGULATION CONSULT NOTE - Follow Up Consult  Pharmacy Consult for coumadin Indication: MVR  Allergies  Allergen Reactions  . Codeine Hives, Itching and Other (See Comments)    "breathing problems"    Patient Measurements: Weight: 234 lb 2.1 oz (106.2 kg)  Vital Signs: Temp: 98.1 F (36.7 C) (06/29 0541) Temp Source: Oral (06/29 0541) BP: 160/70 mmHg (06/29 0846) Pulse Rate: 52 (06/29 0846)  Labs:  Recent Labs  05/19/16 0642 05/20/16 0700 05/21/16 0451  HGB 11.0*  --   --   HCT 38.1  --   --   PLT 683*  --   --   LABPROT 30.5* 32.3* 31.2*  INR 2.98* 3.23* 3.07*  CREATININE 1.07*  --   --     Estimated Creatinine Clearance: 67.7 mL/min (by C-G formula based on Cr of 1.07).  Assessment: 60 yo female on warfarin for MVR - Goal 2.5-3.5 per CVTS d/c summary on 6/16. INR remains therapeutic at 3.07 today. No overt bleeding noted. Continue on amiodarone, dose decreased from 400 mg BID to 200 mg daily on 6/26.   Goal of Therapy:  INR 2.5-3.5 Monitor platelets by anticoagulation protocol: Yes   Plan:  Coumadin 5mg  on TTSS, 2.5 mg on MWF Daily INR Monitor CBC, s/sx bleeding, DI with amiodarone  Maryanna Shape, PharmD, BCPS  Clinical Pharmacist  Pager: [phone]  11:00 AM, 05/21/2016

## 2016-05-21 NOTE — Progress Notes (Signed)
Social Work Patient ID: Margaret [name], female   DOB: 1956-09-26, 60 y.o.   MRN: [id number] Met with pt to discuss team conference progression toward her goals of min assist level, he is still working on a ramp for home, should be done by pt;s discharge. Husband will be here three days prior to her discharge to learn her care, encouraged her to have friend-Theresa come in also since she will be assisting her at home. She will talk with her about this.

## 2016-05-21 NOTE — Progress Notes (Signed)
Physical Therapy Session Note  Patient Details  Name: Margaret [name] MRN: [id number] Date of Birth: August 01, 1956  Today's Date: 05/21/2016 PT Individual Time: 0800-0930 PT Individual Time Calculation (min): 90 min   Short Term Goals: Week 2:  PT Short Term Goal 1 (Week 2): Pt will perform bed mobility with consistent mod A PT Short Term Goal 2 (Week 2): Pt will perform bed <> chair transfers with mod A and maintain sternal precautions PT Short Term Goal 3 (Week 2): Pt will tolerate standing x 5-8 minutes at time for standing balance and gait training activities with mod A PT Short Term Goal 4 (Week 2): Pt will perform ambulation with appropriate AD x 15' with max A  Skilled Therapeutic Interventions/Progress Updates:    Pt presented in chair agreeable to therapy.  Nurse providing meds at beginning of session.  Pt commencing propelling w/c out of room, sudden onset of dizziness, nausea, and urgency for BM.  Nsg notified, BP 160/70 HR 52 SpO2 97%.  Pt transferred to toilet using Stedy with +2 assist 2/2 sense of urgency.  Once returned to w/c via [facility] pt stating continued to have some nausea however dizziness subsiding.  Wished to continue therapy.  Attempted sit to stand from w/c pt making several attempts however unable with PTA assist.  Pt stating increased fatigue this noted increased labored breathing.   Pt able to reposition self in chair with supervision.  Pt then instructed to negotiate out of room in w/c requiring min cues for avoiding objects and L door frame.  Pt then able to propel approx 81f with one brief rest. Pt with improved negotiation around objects as distance improved.  Pt returned to room BP check 147/72 HR 53 SpO2 94% with pt stating decreased nausea.  Pt left in room with all current needs met. .      Therapy Documentation Precautions:  Precautions Precautions: Fall, Sternal Precaution Comments: L hemiparesis; L neglect; L subluxed shoulder Required Braces or  Orthoses: Other Brace/Splint (left shoulder taped) Restrictions Weight Bearing Restrictions: Yes LLE Weight Bearing: Weight bearing as tolerated Other Position/Activity Restrictions: sternal precautions General:   Vital Signs: Therapy Vitals Pulse Rate: (!) 52 BP: (!) 160/70 mmHg (see therapy note, pt got nauseated at start of therapy) Patient Position (if appropriate): Sitting Oxygen Therapy SpO2: 97 % O2 Device: Not Delivered Pain: Pain Assessment Pain Assessment: No/denies pain Pain Score: 0-No pain   See Function Navigator for Current Functional Status.   Therapy/Group: Individual Therapy  Nazair Fortenberry  Karmela Bram, PTA  05/21/2016, 12:15 PM

## 2016-05-22 ENCOUNTER — Inpatient Hospital Stay (HOSPITAL_COMMUNITY): Payer: Self-pay | Admitting: Speech Pathology

## 2016-05-22 ENCOUNTER — Inpatient Hospital Stay (HOSPITAL_COMMUNITY): Payer: Self-pay | Admitting: Physical Therapy

## 2016-05-22 ENCOUNTER — Inpatient Hospital Stay (HOSPITAL_COMMUNITY): Payer: Medicaid Other | Admitting: Occupational Therapy

## 2016-05-22 ENCOUNTER — Ambulatory Visit: Payer: Self-pay | Admitting: Internal Medicine

## 2016-05-22 ENCOUNTER — Inpatient Hospital Stay (HOSPITAL_COMMUNITY): Payer: Medicaid Other | Admitting: Physical Therapy

## 2016-05-22 LAB — PROTIME-INR
INR: 2.83 — AB (ref 0.00–1.49)
PROTHROMBIN TIME: 29.3 s — AB (ref 11.6–15.2)

## 2016-05-22 LAB — GLUCOSE, CAPILLARY
GLUCOSE-CAPILLARY: 131 mg/dL — AB (ref 65–99)
Glucose-Capillary: 140 mg/dL — ABNORMAL HIGH (ref 65–99)
Glucose-Capillary: 147 mg/dL — ABNORMAL HIGH (ref 65–99)
Glucose-Capillary: 159 mg/dL — ABNORMAL HIGH (ref 65–99)

## 2016-05-22 MED ORDER — WARFARIN SODIUM 5 MG PO TABS
5.0000 mg | ORAL_TABLET | Freq: Every day | ORAL | Status: DC
  Administered 2016-05-22 – 2016-05-23 (×2): 5 mg via ORAL
  Filled 2016-05-22 (×2): qty 1

## 2016-05-22 NOTE — Progress Notes (Signed)
Occupational Therapy Session Note  Patient Details  Name: Margaret [name] MRN: [id number] Date of Birth: 07-05-1956  Today's Date: 05/22/2016 OT Co-Treatment Time: 0945-1030 ([phone] session time) OT Co-Treatment Time Calculation (min): 45 min (75 min session time)   Short Term Goals: Week 1:  OT Short Term Goal 1 (Week 1): Pt will be mod I with bed mobility with HOB down as a precursor for ADLs  OT Short Term Goal 1 - Progress (Week 1): Progressing toward goal OT Short Term Goal 2 (Week 1): Pt will complete UB bathing and dressing seated EOB unsupported with moderate assistance  OT Short Term Goal 2 - Progress (Week 1): Met OT Short Term Goal 3 (Week 1): Pt will perform toilet transfer with mod assist  OT Short Term Goal 3 - Progress (Week 1): Met OT Short Term Goal 4 (Week 1): Pt will independently verbalize and adhere to sternal precautions 100% of the time OT Short Term Goal 4 - Progress (Week 1): Met Week 2:  OT Short Term Goal 1 (Week 2): Pt will be educated on tub/shower transfers using tub transfer bench OT Short Term Goal 2 (Week 2): Pt will be min assist with stand pivot transfer w/c to/from [facility] OT Short Term Goal 3 (Week 2): Pt will be educated on a LUE HEP  OT Short Term Goal 4 (Week 2): Pt will perform sit to/from stands with min assist as a precursor for ADLs   Skilled Therapeutic Interventions/Progress Updates:    Pt seen for skilled OT to facilitate sit to stand skills and standing balance with LUE management during ADL retraining. Discussed with pt her L shoulder pain and previous strategy of using [employer]r sling. Pt did not feel sling was helpful and she still had a great deal of pain. Pt uses R hand over chest for sternal precautions. Worked on sit to stand with pt holding her L arm.  Pt liked this method but preferred more support. Experimented with fabricated sling out of a bed sheet. Pt really liked the idea of this sling and felt more confident and safer with her  mobility. Pt practiced toilet transfers, sit to stand 3x (toilet and sink ), w.c to bed transfers. Pt only needed mod A with 2 therapists for safety and reassurance.  Pt tolerated standing for at least 2 min with S with R hand on sink and with mod A with no UE support. Pt resting in chair at end of session with all needs met.  Therapy Documentation Precautions:  Precautions Precautions: Fall, Sternal Precaution Comments: L hemiparesis; L neglect; L subluxed shoulder Required Braces or Orthoses: Other Brace/Splint (left shoulder taped) Restrictions Weight Bearing Restrictions: Yes LLE Weight Bearing: Weight bearing as tolerated Other Position/Activity Restrictions: sternal precautions    Vital Signs: Therapy Vitals Temp: 97.5 F (36.4 C) Temp Source: Oral Pulse Rate: 62 Resp: 18 BP: 110/61 mmHg Patient Position (if appropriate): Lying Oxygen Therapy SpO2: 97 % O2 Device: Not Delivered Pain: pt c/o L shoulder pain, RN aware   ADL:   See Function Navigator for Current Functional Status.   Therapy/Group: Individual Therapy  [place] 05/22/2016, 8:31 AM

## 2016-05-22 NOTE — Progress Notes (Signed)
ANTICOAGULATION CONSULT NOTE - Follow Up Consult  Pharmacy Consult for coumadin Indication: MVR  Allergies  Allergen Reactions  . Codeine Hives, Itching and Other (See Comments)    "breathing problems"    Patient Measurements: Weight: 233 lb 4.8 oz (105.824 kg)  Vital Signs: Temp: 97.5 F (36.4 C) (06/30 0532) Temp Source: Oral (06/30 0532) BP: 110/61 mmHg (06/30 0532) Pulse Rate: 62 (06/30 0532)  Labs:  Recent Labs  05/20/16 0700 05/21/16 0451 05/22/16 0515  LABPROT 32.3* 31.2* 29.3*  INR 3.23* 3.07* 2.83*    Estimated Creatinine Clearance: 67.5 mL/min (by C-G formula based on Cr of 1.07).  Assessment: 60 yo female on warfarin for MVR - Goal 2.5-3.5 per CVTS d/c summary on 6/16. INR remains therapeutic at 2.83 today. No overt bleeding noted. Better po intake, Continue on amiodarone, dose decreased from 400 mg BID to 200 mg daily on 6/26.   Goal of Therapy:  INR 2.5-3.5 Monitor platelets by anticoagulation protocol: Yes   Plan:  Coumadin 5mg  daily Daily INR Monitor CBC, s/sx bleeding  Maryanna Shape, PharmD, BCPS  Clinical Pharmacist  Pager: [phone]  11:25 AM, 05/22/2016

## 2016-05-22 NOTE — Progress Notes (Signed)
Physical Therapy Session Note  Patient Details  Name: Margaret [name] MRN: [id number] Date of Birth: 1956-05-07  Today's Date: 05/22/2016 PT Individual Time: 1550-1615 PT Individual Time Calculation (min): 25 min   Short Term Goals: Week 2:  PT Short Term Goal 1 (Week 2): Pt will perform bed mobility with consistent mod A PT Short Term Goal 2 (Week 2): Pt will perform bed <> chair transfers with mod A and maintain sternal precautions PT Short Term Goal 3 (Week 2): Pt will tolerate standing x 5-8 minutes at time for standing balance and gait training activities with mod A PT Short Term Goal 4 (Week 2): Pt will perform ambulation with appropriate AD x 15' with max A  Skilled Therapeutic Interventions/Progress Updates:   Patient sitting in wheelchair, handoff from previous PT. Session focused on functional transfers for sit > stand from wheelchair to [facility] with max A overall and perched sitting <> standing in Stedy x 5 with supervision and cardiorespiratory endurance, standing tolerance, and dynamic standing balance in [place] while engaging in multidirectional reaching using RUE via [employer] Mode A Endurance for 2 trials of 1.5 min and 1 min with steady assist. Patient required prolonged rest breaks due to SOB and fatigue after standing trials. Patient left sitting in wheelchair with all needs in reach.    Therapy Documentation Precautions:  Precautions Precautions: Fall, Sternal Precaution Comments: L hemiparesis; L neglect; L subluxed shoulder Required Braces or Orthoses: Other Brace/Splint (left shoulder taped) Restrictions Weight Bearing Restrictions: Yes LLE Weight Bearing: Weight bearing as tolerated Other Position/Activity Restrictions: sternal precautions Pain: Unrated buttocks pain in sitting on Stedy due to hemorrhoids, repositioned    See Function Navigator for Current Functional Status.   Therapy/Group: Individual Therapy  Laretta Alstrom 05/22/2016, 4:38 PM

## 2016-05-22 NOTE — Progress Notes (Signed)
Speech Language Pathology Daily Session Note  Patient Details  Name: Margaret [name] MRN: [id number] Date of Birth: 02/03/1956  Today's Date: 05/22/2016 SLP Individual Time: 1305-1405 SLP Individual Time Calculation (min): 60 min  Short Term Goals: Week 2: SLP Short Term Goal 1 (Week 2): Pt will consume regular textures and thin liquids during a meal with mod I use of swallowing precautions to clear residuals from the oral cavity post swallow.  SLP Short Term Goal 2 (Week 2): Patient will utilize speech intelligibility strategies at the conversational level with mod I.   SLP Short Term Goal 3 (Week 2): Patient will utilize external aids to assist with recall of new, daily information with supervision verbal cues  SLP Short Term Goal 4 (Week 2): Patient will solve problems during completion of familiar tasks with mod I to self-monitor and correct errors for 100% accuracy.   Skilled Therapeutic Interventions: Pt completed moderately complex deductive reasoning puzzle at mod I due to increased time for processing. Sustained attention for 60 min session without cueing. Pt unable to recall dates of recent hospitalizations, but when given information, pt able to construct an accurate timeline and then use the timeline to retell her recent medical history at mod I. Pt provided with a calendar to improve self-monitoring of orientation to time.  Function:  Eating Eating   Modified Consistency Diet: No Eating Assist Level: Set up assist for   Eating Set Up Assist For: Opening containers       Cognition Comprehension Comprehension assist level: Understands complex 90% of the time/cues 10% of the time  Expression   Expression assist level: Expresses complex 90% of the time/cues < 10% of the time  Social Interaction Social Interaction assist level: Interacts appropriately with others with medication or extra time (anti-anxiety, antidepressant).  Problem Solving Problem solving assist level:  Solves complex 90% of the time/cues < 10% of the time  Memory Memory assist level: Recognizes or recalls 75 - 89% of the time/requires cueing 10 - 24% of the time    Pain Pain Assessment Pain Assessment: No/denies pain  Therapy/Group: Individual Therapy  Vinetta Bergamo MA, CCC-SLP 05/22/2016, 2:17 PM

## 2016-05-22 NOTE — Progress Notes (Signed)
[facility] [facility] PHYSICAL MEDICINE & REHABILITATION     PROGRESS NOTE  Subjective/Complaints:  Pt sleeping in bed. She is easily arousable.  She states she feels really good.   ROS: Denies CP, SOB, nausea, vomiting, diarrhea.  Objective: Vital Signs: Blood pressure 110/61, pulse 62, temperature 97.5 F (36.4 C), temperature source Oral, resp. rate 18, weight 105.824 kg (233 lb 4.8 oz), SpO2 97 %. No results found. No results for input(s): WBC, HGB, HCT, PLT in the last 72 hours. No results for input(s): NA, K, CL, GLUCOSE, BUN, CREATININE, CALCIUM in the last 72 hours.  Invalid input(s): CO CBG (last 3)   Recent Labs  05/21/16 1634 05/21/16 2126 05/22/16 0757  GLUCAP 167* 123* 131*    Wt Readings from Last 3 Encounters:  05/22/16 105.824 kg (233 lb 4.8 oz)  05/12/16 105.008 kg (231 lb 8 oz)  04/14/16 117.935 kg (260 lb)    Physical Exam:  BP 110/61 mmHg  Pulse 62  Temp(Src) 97.5 F (36.4 C) (Oral)  Resp 18  Wt 105.824 kg (233 lb 4.8 oz)  SpO2 97% Constitutional: She appears well-developed. Obese  HENT: Normocephalic and atraumatic.  Eyes: EOM are normal. Right eye exhibits no discharge. Left eye exhibits no discharge.  Cardiovascular: Regular Rate and rhythm. Respiratory: No respiratory distress. Effort normal.  GI: Soft. Bowel sounds are normal.  Musculoskeletal: She exhibits edema. She exhibits no tenderness.  Neurological: She is alert. A cranial nerve deficit is present.  Left facial weakness  Dysarthria but intelligible.  Follows simple commands. Has reasonable insight and awareness. Memory fair. RUE/RLE: 5/5 proximal to distal LUE 0/5 proximal to distal.  LLE: 2/5 proximal to distal. mAS 1/4 Left elbow extension Skin: Chest incision clean dry. Vascular changes bilateral lower extremities  Psychiatric: Her affect is blunt and flat. Her speech is delayed. She is slowed.    Assessment/Plan: 1. 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 Physiatrist is providing close team supervision and 24 hour management of active medical problems listed below. Physiatrist and rehab team continue to assess barriers to discharge/monitor patient progress toward functional and medical goals.  Function:  Bathing Bathing position   Position: Shower  Bathing parts Body parts bathed by patient: Left arm, Chest, Abdomen, Buttocks, Right upper leg, Left upper leg Body parts bathed by helper: Right arm, Left lower leg, Right lower leg, Back, Front perineal area  Bathing assist Assist Level: Touching or steadying assistance(Pt > 75%)      Upper Body Dressing/Undressing Upper body dressing   What is the patient wearing?: Pull over shirt/dress Bra - Perfomed by patient: Thread/unthread right bra strap, Thread/unthread left bra strap, Hook/unhook bra (pull down sports bra)   Pull over shirt/dress - Perfomed by patient: Thread/unthread right sleeve, Thread/unthread left sleeve, Put head through opening, Pull shirt over trunk          Upper body assist Assist Level: Touching or steadying assistance(Pt > 75%)      Lower Body Dressing/Undressing Lower body dressing   What is the patient wearing?: Pants, Socks, Shoes, AFO     Pants- Performed by patient: Thread/unthread right pants leg, Thread/unthread left pants leg Pants- Performed by helper: Pull pants up/down       Socks - Performed by helper: Don/doff right sock, Don/doff left sock Shoes - Performed by patient:  Don/doff right shoe, Fasten right, Fasten left Shoes - Performed by helper: Don/doff left shoe   AFO - Performed by helper: Don/doff left AFO      Lower body assist Assist for lower body dressing: Touching or steadying assistance (Pt > 75%)       Toileting Toileting Toileting activity did not occur: No continent bowel/bladder event Toileting steps completed by patient: Adjust clothing after toileting Toileting steps completed by helper: Adjust clothing prior to toileting, Performs perineal hygiene, Adjust clothing after toileting    Toileting assist Assist level: Two helpers   Transfers Chair/bed transfer   Chair/bed transfer method: Stand pivot Chair/bed transfer assist level: Maximal assist (Pt 25 - 49%/lift and lower) Chair/bed transfer assistive device: Armrests Mechanical lift: Stedy   Locomotion Ambulation     Max distance: 47ft Assist level: Maximal assist (Pt 25 - 49%)   Wheelchair   Type: Manual Max wheelchair distance: 75 Assist Level: Supervision or verbal cues  Cognition Comprehension Comprehension assist level: Follows basic conversation/direction with no assist  Expression Expression assist level: Expresses basic needs/ideas: With no assist  Social Interaction Social Interaction assist level: Interacts appropriately 90% of the time - Needs monitoring or encouragement for participation or interaction.  Problem Solving Problem solving assist level: Solves basic problems with no assist  Memory Memory assist level: Recognizes or recalls 90% of the time/requires cueing < 10% of the time    Medical Problem List and Plan: 1. 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 Sternal precautions after mitral valve repair  Continue CIR  PRAFO, hand splints   Extensor tone in LUE, however, this appears to be functional at present, will consider treatment if necessary 2. DVT Prophylaxis/Anticoagulation: Coumadin therapy after mitral valve repair  INR Therapeutic today. Pharmacy protocol 3. Pain Management: Oxycodone as needed 4. Mood/panic attacks: Lexapro 10 mg daily at bedtime, trazodone 100  mg daily at bedtime, Klonopin 0.5 mg daily at bedtime and 3 times a day as needed 5. Neuropsych: This patient is not fully capable of making decisions on her own behalf. 6. Skin/Wound Care: Routine skin checks 7. Fluids/Electrolytes/Nutrition: Routine I&O  8. Dysphagia. Dysphagia #3 thin liquids. Follow speech therapy 9. PAF with RVR. Continue amiodarone, Aldactone 50 mg daily, Tenormin 12.5 mg twice a day. Appreciate Cards recs, signed off.  10. Acute on chronic diastolic congestive heart failure.   Lasix 80 mg daily, increased to 60 BID on 6/22 .  daily weights  Monitor for any signs of fluid overload, stable at present [facility] Weights   05/20/16 0500 05/21/16 0541 05/22/16 0532  Weight: 106.34 kg (234 lb 7 oz) 106.2 kg (234 lb 2.1 oz) 105.824 kg (233 lb 4.8 oz)  11. Diabetes mellitus with peripheral neuropathy. Check blood sugars before meals and at bedtime. Continue Levemir 28 units daily. Diabetic teaching  Fairly well controlled  Continue to monitor 12. Leukocytosis: Trending down  Currently afebrile  WBCs 11.0 on 6/27  UA negative, U culture with multiple species  Chest x-ray from 6/21 showing interstitial edema and? Infiltrate/atelectasis (appears to have slightly improved from previous x-ray) 13. AKI  Creatinine 1.07 on 6/27 (stable)  Encourage fluid intake 14. Constipation: Improved 15. ABLA  Hb 11.0 on 6/27  Cont to monitor  LOS (Days) 10 A FACE TO FACE EVALUATION WAS PERFORMED  Laird Runnion Lorie Phenix 05/22/2016 9:03 AM

## 2016-05-22 NOTE — Progress Notes (Signed)
Orthopedic Tech Progress Note Patient Details:  Margaret [name] [birth year] [id number]  Ortho Devices Type of Ortho Device: Arm sling Ortho Device/Splint Location: lue Ortho Device/Splint Interventions: Application   Taysean Wager 05/22/2016, 12:00 PM

## 2016-05-22 NOTE — Progress Notes (Signed)
Physical Therapy Session Note  Patient Details  Name: Margaret [name] MRN: [id number] Date of Birth: [birth year]  Today's Date: 05/22/2016 PT Individual Time: [id number] PT Individual Time Calculation (min): 50 min   Short Term Goals: Week 2:  PT Short Term Goal 1 (Week 2): Pt will perform bed mobility with consistent mod A PT Short Term Goal 2 (Week 2): Pt will perform bed <> chair transfers with mod A and maintain sternal precautions PT Short Term Goal 3 (Week 2): Pt will tolerate standing x 5-8 minutes at time for standing balance and gait training activities with mod A PT Short Term Goal 4 (Week 2): Pt will perform ambulation with appropriate AD x 15' with max A  Skilled Therapeutic Interventions/Progress Updates:    Pt received resting in w/c, no c/o pain but reports having episode of incontinence, but agreeable to therapy if able to change.  Sit<>stand from w/c into stedy with +2 assist.  Pt able to tolerate 3 trials of standing, max time 5 minutes, in stedy for changing soiled brief and hygiene.  Total assist for clothing management and hygiene.  Pt transported to therapy gym in w/c total assist for time management.  Repeated sit<>squat from w/c x3 trials with min/mod assist for forward weight shift and for R weight shift when returning to seated position.  Transition to sit<>stand from w/c 2x2 reps with mod assist overall with verbal cues for R weight shift and L knee extension in standing.  Pt handoff to next PT at end of session.    Therapy Documentation Precautions:  Precautions Precautions: Fall, Sternal Precaution Comments: L hemiparesis; L neglect; L subluxed shoulder Required Braces or Orthoses: Other Brace/Splint (left shoulder taped) Restrictions Weight Bearing Restrictions: Yes LLE Weight Bearing: Weight bearing as tolerated Other Position/Activity Restrictions: sternal precautions   See Function Navigator for Current Functional Status.   Therapy/Group: Individual  Therapy  Earnest Conroy Penven-Crew 05/22/2016, 4:32 PM

## 2016-05-22 NOTE — Progress Notes (Signed)
Occupational Therapy Session Note  Patient Details  Name: SHANIN PARRILLI MRN: [id number] Date of Birth: 04/23/1956  Today's Date: 05/22/2016 OT Individual Time:  -     and Today's Date: 05/22/2016 OT Co-Treatment Time: 1030-1100 OT Co-Treatment Time Calculation (min): 30 min   Skilled Therapeutic Interventions/Progress Updates:    Pt participated in skilled OT session focusing on self care mgt retraining. Pt completed UB dressing with Min A for threading L UE into shirt with mod vcs on hemi technique. Pt continues to present with decreased functional use of L UE and continues to benefit from incorporation of L UE into ADL tasks. Pt completed LB dressing with Max A to hike pants while standing with 2 helpers. Pt  maintained balance with Min A and bilateral support at sink but required 2 helpers for sit to stand due to L sided weakness. Pt completed oral care standing at sink with overall Mod A and unilateral support with weightbearing L UE. Pt benefits from balance remediation and neuro re education of L UE at this time due to present deficits. At end of session, pt placed in w/c with all needs within reach.   Therapy Documentation Precautions:  Precautions Precautions: Fall, Sternal Precaution Comments: L hemiparesis; L neglect; L subluxed shoulder Required Braces or Orthoses: Other Brace/Splint (left shoulder taped) Restrictions Weight Bearing Restrictions: Yes LLE Weight Bearing: Weight bearing as tolerated Other Position/Activity Restrictions: sternal precautions General:   Vital Signs: Therapy Vitals Temp: 97.5 F (36.4 C) Temp Source: Oral Pulse Rate: (!) 57 Resp: 18 BP: 100/65 mmHg Patient Position (if appropriate): Sitting Oxygen Therapy SpO2: 97 % O2 Device: Not Delivered Pain: Pain Assessment Pain Assessment: No/denies pain     See Function Navigator for Current Functional Status.   Therapy/Group: Co-Treatment  Blakely Gluth A [place] Bojanowski 05/22/2016, 4:09 PM

## 2016-05-23 ENCOUNTER — Inpatient Hospital Stay (HOSPITAL_COMMUNITY): Payer: Medicaid Other | Admitting: Physical Therapy

## 2016-05-23 LAB — GLUCOSE, CAPILLARY
GLUCOSE-CAPILLARY: 191 mg/dL — AB (ref 65–99)
Glucose-Capillary: 143 mg/dL — ABNORMAL HIGH (ref 65–99)
Glucose-Capillary: 161 mg/dL — ABNORMAL HIGH (ref 65–99)
Glucose-Capillary: 189 mg/dL — ABNORMAL HIGH (ref 65–99)

## 2016-05-23 LAB — PROTIME-INR
INR: 2.76 — AB (ref 0.00–1.49)
Prothrombin Time: 28.8 seconds — ABNORMAL HIGH (ref 11.6–15.2)

## 2016-05-23 NOTE — Progress Notes (Signed)
[facility] PHYSICAL MEDICINE & REHABILITATION     PROGRESS NOTE  Subjective/Complaints:  Up eating breakfast. No new complaints. Slept well. No pain  ROS: Denies CP, SOB, nausea, vomiting, diarrhea.  Objective: Vital Signs: Blood pressure 122/68, pulse 67, temperature 97.9 F (36.6 C), temperature source Oral, resp. rate 18, weight 105.9 kg (233 lb 7.5 oz), SpO2 100 %. No results found. No results for input(s): WBC, HGB, HCT, PLT in the last 72 hours. No results for input(s): NA, K, CL, GLUCOSE, BUN, CREATININE, CALCIUM in the last 72 hours.  Invalid input(s): CO CBG (last 3)   Recent Labs  05/22/16 1659 05/22/16 2137 05/23/16 0701  GLUCAP 147* 159* 143*    Wt Readings from Last 3 Encounters:  05/23/16 105.9 kg (233 lb 7.5 oz)  05/12/16 105.008 kg (231 lb 8 oz)  04/14/16 117.935 kg (260 lb)    Physical Exam:  BP 122/68 mmHg  Pulse 67  Temp(Src) 97.9 F (36.6 C) (Oral)  Resp 18  Wt 105.9 kg (233 lb 7.5 oz)  SpO2 100% Constitutional: She appears well-developed. Obese  HENT: Normocephalic and atraumatic.  Eyes: EOM are normal. Right eye exhibits no discharge. Left eye exhibits no discharge.  Cardiovascular: Regular Rate and rhythm. Respiratory: No respiratory distress. Effort normal.  GI: Soft. Bowel sounds are normal.  Musculoskeletal: She exhibits edema. She exhibits no tenderness.  Neurological: She is alert. A cranial nerve deficit is present.  Left facial weakness  Dysarthria but intelligible.  Follows simple commands. Has reasonable insight and awareness. Memory fair. RUE/RLE: 5/5 proximal to distal LUE 0/5 proximal to distal.  LLE: 2/5 proximal to distal. mAS 1/4 Left elbow extension Skin: Chest incision clean dry. Vascular changes bilateral lower extremities  Psychiatric: Her affect is blunt and flat. Her speech is delayed. She is slowed.    Assessment/Plan: 1. 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 Physiatrist is providing close team supervision and 24 hour management of active medical problems listed below. Physiatrist and rehab team continue to assess barriers to discharge/monitor patient progress toward functional and medical goals.  Function:  Bathing Bathing position   Position: Shower  Bathing parts Body parts bathed by patient: Left arm, Chest, Abdomen, Buttocks, Right upper leg, Left upper leg Body parts bathed by helper: Right arm, Left lower leg, Right lower leg, Back, Front perineal area  Bathing assist Assist Level: Touching or steadying assistance(Pt > 75%)      Upper Body Dressing/Undressing Upper body dressing   What is the patient wearing?: Pull over shirt/dress Bra - Perfomed by patient: Thread/unthread right bra strap, Thread/unthread left bra strap, Hook/unhook bra (pull down sports bra)   Pull over shirt/dress - Perfomed by patient: Thread/unthread left sleeve, Put head through opening, Pull shirt over trunk Pull over shirt/dress - Perfomed by helper: Thread/unthread right sleeve        Upper body assist Assist Level: 2 helpers      Lower Body Dressing/Undressing Lower body dressing   What is the patient wearing?: Pants, Socks, Shoes, AFO     Pants- Performed by patient: Thread/unthread right pants leg, Thread/unthread left pants leg Pants- Performed by helper: Pull pants up/down       Socks - Performed by helper: Don/doff right sock, Don/doff left sock Shoes - Performed by patient: Don/doff right shoe, Fasten right,  Fasten left Shoes - Performed by helper: Don/doff left shoe   AFO - Performed by helper: Don/doff left AFO      Lower body assist Assist for lower body dressing: Touching or steadying assistance (Pt > 75%)      Toileting Toileting Toileting  activity did not occur: No continent bowel/bladder event Toileting steps completed by patient: Adjust clothing after toileting Toileting steps completed by helper: Adjust clothing prior to toileting, Performs perineal hygiene, Adjust clothing after toileting    Toileting assist Assist level: Two helpers   Transfers Chair/bed transfer   Chair/bed transfer method: Stand pivot Chair/bed transfer assist level: Maximal assist (Pt 25 - 49%/lift and lower) Chair/bed transfer assistive device: Armrests Mechanical lift: Ecologist     Max distance: 22ft Assist level: Maximal assist (Pt 25 - 49%)   Wheelchair   Type: Manual Max wheelchair distance: 75 Assist Level: Dependent (Pt equals 0%)  Cognition Comprehension Comprehension assist level: Understands basic 90% of the time/cues < 10% of the time  Expression Expression assist level: Expresses complex 90% of the time/cues < 10% of the time  Social Interaction Social Interaction assist level: Interacts appropriately with others with medication or extra time (anti-anxiety, antidepressant).  Problem Solving Problem solving assist level: Solves basic 90% of the time/requires cueing < 10% of the time  Memory Memory assist level: Recognizes or recalls 75 - 89% of the time/requires cueing 10 - 24% of the time    Medical Problem List and Plan: 1. 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 Sternal precautions after mitral valve repair  Continue CIR  PRAFO, hand splints   Extensor tone in LUE, however, this appears to be functional at present, will consider treatment if necessary 2. DVT Prophylaxis/Anticoagulation: Coumadin therapy after mitral valve repair  INR Therapeutic today. Pharmacy protocol 3. Pain Management: Oxycodone as needed 4. Mood/panic attacks: Lexapro 10 mg daily at bedtime,  trazodone 100 mg daily at bedtime, Klonopin 0.5 mg daily at bedtime and 3 times a day as needed 5. Neuropsych: This patient is not fully capable of making decisions on her own behalf. 6. Skin/Wound Care: Routine skin checks 7. Fluids/Electrolytes/Nutrition: Routine I&O  8. Dysphagia. Dysphagia #3 thin liquids. Follow speech therapy 9. PAF with RVR. Continue amiodarone, Aldactone 50 mg daily, Tenormin 12.5 mg twice a day. Appreciate Cards recs, signed off.  10. Acute on chronic diastolic congestive heart failure.   Lasix 80 mg daily, increased to 60 BID on 6/22 .  daily weights  Monitor for any signs of fluid overload, weights trending back down Filed Weights   05/21/16 0541 05/22/16 0532 05/23/16 0459  Weight: 106.2 kg (234 lb 2.1 oz) 105.824 kg (233 lb 4.8 oz) 105.9 kg (233 lb 7.5 oz)   -recheck BMET Monday 11. Diabetes mellitus with peripheral neuropathy. Check blood sugars before meals and at bedtime. Continue Levemir 28 units daily. Diabetic teaching  Improving control  Continue to monitor 12. Leukocytosis: Trending down  Currently afebrile  WBCs 11.0 on 6/27  UA negative, U culture with multiple species  Chest x-ray from 6/21 showing interstitial edema and? Infiltrate/atelectasis (appears to have slightly improved from previous x-ray) 13. AKI  Creatinine 1.07 on 6/27 (stable)  Encourage fluid intake 14. Constipation: Improved 15. ABLA  Hb 11.0 on 6/27  Cont to monitor  LOS (Days) 11 A FACE TO FACE EVALUATION WAS PERFORMED  SWARTZ,ZACHARY T 05/23/2016 8:54 AM

## 2016-05-23 NOTE — Progress Notes (Signed)
Physical Therapy Session Note  Patient Details  Name: Margaret [name] MRN: [id number] Date of Birth: 15-Oct-1956  Today's Date: 05/23/2016 PT Individual Time: 0900-1000 PT Individual Time Calculation (min): 60 min   Short Term Goals: Week 2:  PT Short Term Goal 1 (Week 2): Pt will perform bed mobility with consistent mod A PT Short Term Goal 2 (Week 2): Pt will perform bed <> chair transfers with mod A and maintain sternal precautions PT Short Term Goal 3 (Week 2): Pt will tolerate standing x 5-8 minutes at time for standing balance and gait training activities with mod A PT Short Term Goal 4 (Week 2): Pt will perform ambulation with appropriate AD x 15' with max A  Skilled Therapeutic Interventions/Progress Updates:   Upon arrival to room, patient crying out and stating she is about to have a panic attack and needs out of the bed. Patient required max A to transfer to sitting EOB, donned LUE sling total A, and performed sit <> stand via Stedy with mod A to transfer to wheelchair. Once in wheelchair, patient appeared to relax. Discussed strategies to decrease anxiety as patient reports this is the second morning this has happened. Sign posted above patient's bed to alert staff to transfer patient OOB to wheelchair BEFORE breakfast upon waking in morning to decrease anxiety/feelings of claustrophobia. Patient performed grooming tasks at sink from wheelchair level with assist for opening/closing containers. Patient directed care for UB/LB dressing with setup and steadying assist overall from wheelchair level with sit <> stand and intermittent use of hemiwalker for RUE support. Performed sit <> stand transfers from wheelchair with min-mod A overall. Patient engaged in standing balance and activity tolerance with close supervision x 3 trials while engaging in tabletop task with intermittent RUE support on hemiwalker x 2 min 20 sec, 52 sec, and 3 min 20 sec. Patient left sitting in wheelchair with all  needs within reach.   Therapy Documentation Precautions:  Precautions Precautions: Fall, Sternal Precaution Comments: L hemiparesis; L neglect; L subluxed shoulder Required Braces or Orthoses: Other Brace/Splint (left shoulder taped) Restrictions Weight Bearing Restrictions: Yes LLE Weight Bearing: Weight bearing as tolerated Other Position/Activity Restrictions: sternal precautions Pain: Pain Assessment Pain Assessment: 0-10 Pain Score: 3  Pain Type: Acute pain Pain Location: Shoulder Pain Orientation: Left Pain Descriptors / Indicators: Aching Pain Onset: On-going Pain Intervention(s): Repositioned   See Function Navigator for Current Functional Status.   Therapy/Group: Individual Therapy  Laretta Alstrom 05/23/2016, 9:42 AM

## 2016-05-23 NOTE — Progress Notes (Signed)
ANTICOAGULATION CONSULT NOTE - Follow Up Consult  Pharmacy Consult for coumadin Indication: MVR/PAF  Allergies  Allergen Reactions  . Codeine Hives, Itching and Other (See Comments)    "breathing problems"    Patient Measurements: Weight: 233 lb 7.5 oz (105.9 kg)  Vital Signs: Temp: 97.9 F (36.6 C) (07/01 0459) Temp Source: Oral (07/01 0459) BP: 122/68 mmHg (07/01 0459) Pulse Rate: 67 (07/01 0459)  Labs:  Recent Labs  05/21/16 0451 05/22/16 0515 05/23/16 0414  LABPROT 31.2* 29.3* 28.8*  INR 3.07* 2.83* 2.76*    Estimated Creatinine Clearance: 67.6 mL/min (by C-G formula based on Cr of 1.07).  Assessment: 60 yo female on warfarin for MVR/PAF - Goal 2.5-3.5 per CVTS d/c summary on 6/16. INR remains therapeutic at 2.76 today. No overt bleeding noted. Better po intake, continue on amiodarone, dose decreased from 400 mg BID to 200 mg daily on 6/26 so will eventually require higher coumadin dosing, monitor closely.    Goal of Therapy:  INR 2.5-3.5 Monitor platelets by anticoagulation protocol: Yes   Plan:  Coumadin 5mg  daily Daily INR Monitor CBC, s/sx bleeding  Nahsir Venezia K. Velva Harman, PharmD, BCPS, CPP Clinical Pharmacist Pager: [phone] Phone: [phone] 05/23/2016 9:15 AM

## 2016-05-24 ENCOUNTER — Inpatient Hospital Stay (HOSPITAL_COMMUNITY): Payer: Medicaid Other | Admitting: Physical Therapy

## 2016-05-24 DIAGNOSIS — M25512 Pain in left shoulder: Secondary | ICD-10-CM

## 2016-05-24 LAB — GLUCOSE, CAPILLARY
GLUCOSE-CAPILLARY: 168 mg/dL — AB (ref 65–99)
GLUCOSE-CAPILLARY: 171 mg/dL — AB (ref 65–99)
GLUCOSE-CAPILLARY: 219 mg/dL — AB (ref 65–99)
Glucose-Capillary: 179 mg/dL — ABNORMAL HIGH (ref 65–99)

## 2016-05-24 LAB — PROTIME-INR
INR: 3.2 — ABNORMAL HIGH (ref 0.00–1.49)
PROTHROMBIN TIME: 32.1 s — AB (ref 11.6–15.2)

## 2016-05-24 MED ORDER — MUSCLE RUB 10-15 % EX CREA
TOPICAL_CREAM | Freq: Three times a day (TID) | CUTANEOUS | Status: DC | PRN
  Administered 2016-05-24 – 2016-05-26 (×3): via TOPICAL
  Filled 2016-05-24: qty 85

## 2016-05-24 MED ORDER — WARFARIN SODIUM 2.5 MG PO TABS
2.5000 mg | ORAL_TABLET | Freq: Once | ORAL | Status: AC
  Administered 2016-05-24: 2.5 mg via ORAL
  Filled 2016-05-24: qty 1

## 2016-05-24 MED ORDER — INSULIN DETEMIR 100 UNIT/ML ~~LOC~~ SOLN
30.0000 [IU] | Freq: Every day | SUBCUTANEOUS | Status: DC
  Administered 2016-05-24 – 2016-05-27 (×4): 30 [IU] via SUBCUTANEOUS
  Filled 2016-05-24 (×4): qty 0.3

## 2016-05-24 NOTE — Progress Notes (Addendum)
[facility]     PROGRESS NOTE  Subjective/Complaints:  Just woke up. Left shoulder sore.   ROS: Denies CP, SOB, nausea, vomiting, diarrhea.  Objective: Vital Signs: Blood pressure 143/50, pulse 61, temperature 97.8 F (36.6 C), temperature source Oral, resp. rate 18, weight 105 kg (231 lb 7.7 oz), SpO2 95 %. No results found. No results for input(s): WBC, HGB, HCT, PLT in the last 72 hours. No results for input(s): NA, K, CL, GLUCOSE, BUN, CREATININE, CALCIUM in the last 72 hours.  Invalid input(s): CO CBG (last 3)   Recent Labs  05/23/16 1659 05/23/16 2102 05/24/16 0645  GLUCAP 161* 189* 179*    Wt Readings from Last 3 Encounters:  05/24/16 105 kg (231 lb 7.7 oz)  05/12/16 105.008 kg (231 lb 8 oz)  04/14/16 117.935 kg (260 lb)    Physical Exam:  BP 143/50 mmHg  Pulse 61  Temp(Src) 97.8 F (36.6 C) (Oral)  Resp 18  Wt 105 kg (231 lb 7.7 oz)  SpO2 95% Constitutional: She appears well-developed. Obese  HENT: Normocephalic and atraumatic.  Eyes: EOM are normal. Right eye exhibits no discharge. Left eye exhibits no discharge.  Cardiovascular: Regular Rate and rhythm. Respiratory: No respiratory distress. Effort normal.  GI: Soft. Bowel sounds are normal.  Musculoskeletal: She exhibits edema. Mild tenderness with PROM left shoulder. 1/2" sublux  Neurological: She is alert. A cranial nerve deficit is present.  Left facial weakness  Dysarthria but intelligible.  Follows simple commands. Has reasonable insight and awareness. Memory fair. RUE/RLE: 5/5 proximal to distal LUE 0/5 proximal to distal.  LLE: 2/5 proximal to distal. mAS 1/4 Left elbow extension Skin: Chest incision clean dry. Vascular changes bilateral lower extremities  Psychiatric: Her affect is blunt and flat. Her speech is delayed. She is slowed.    Assessment/Plan: 1. 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 Physiatrist is providing close team supervision and 24 hour management of active medical problems listed below. Physiatrist and rehab team continue to assess barriers to discharge/monitor patient progress toward functional and medical goals.  Function:  Bathing Bathing position   Position: Shower  Bathing parts Body parts bathed by patient: Left arm, Chest, Abdomen, Buttocks, Right upper leg, Left upper leg Body parts bathed by helper: Right arm, Left lower leg, Right lower leg, Back, Front perineal area  Bathing assist Assist Level: Touching or steadying assistance(Pt > 75%)      Upper Body Dressing/Undressing Upper body dressing   What is the patient wearing?: Pull over shirt/dress Bra - Perfomed by patient: Thread/unthread right bra strap, Thread/unthread left bra strap, Hook/unhook bra (pull down sports bra)   Pull over shirt/dress - Perfomed by patient: Thread/unthread left sleeve, Put head through opening, Pull shirt over trunk, Thread/unthread right sleeve Pull over shirt/dress - Perfomed by helper: Thread/unthread right sleeve        Upper body assist Assist Level: Touching or steadying assistance(Pt > 75%) (supporting LUE)      Lower Body Dressing/Undressing Lower body dressing   What is the patient wearing?: Pants     Pants- Performed by patient: Thread/unthread right pants leg Pants- Performed by helper: Thread/unthread left pants leg, Pull pants up/down       Socks - Performed by helper: Don/doff right sock, Don/doff left sock Shoes - Performed  by patient: Don/doff right shoe, Fasten right, Fasten left Shoes - Performed by helper: Don/doff left shoe   AFO - Performed by helper: Don/doff left AFO      Lower body assist Assist for lower body dressing: Touching or steadying assistance (Pt >  75%)      Toileting Toileting Toileting activity did not occur: No continent bowel/bladder event Toileting steps completed by patient: Adjust clothing after toileting Toileting steps completed by helper: Adjust clothing prior to toileting, Performs perineal hygiene, Adjust clothing after toileting    Toileting assist Assist level: Two helpers   Transfers Chair/bed transfer   Chair/bed transfer method: Other Chair/bed transfer assist level: Moderate assist (Pt 50 - 74%/lift or lower) Chair/bed transfer assistive device: Mechanical lift Mechanical lift: Ecologist     Max distance: 53ft Assist level: Maximal assist (Pt 25 - 49%)   Wheelchair   Type: Manual Max wheelchair distance: 75 Assist Level: Dependent (Pt equals 0%)  Cognition Comprehension Comprehension assist level: Understands basic 90% of the time/cues < 10% of the time  Expression Expression assist level: Expresses complex 90% of the time/cues < 10% of the time  Social Interaction Social Interaction assist level: Interacts appropriately with others with medication or extra time (anti-anxiety, antidepressant).  Problem Solving Problem solving assist level: Solves basic 90% of the time/requires cueing < 10% of the time  Memory Memory assist level: Recognizes or recalls 75 - 89% of the time/requires cueing 10 - 24% of the time    Medical Problem List and Plan: 1. 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 Sternal precautions after mitral valve repair  Continue CIR  PRAFO, hand splints     2. DVT Prophylaxis/Anticoagulation: Coumadin therapy after mitral valve repair  INR Therapeutic today. Pharmacy protocol  3. Pain Management: Oxycodone as needed  -muscle rub for left shoulder pain,mild sublux  -pt using sling when up with therapy  -keep left arm elevated while in  bed/chair 4. Mood/panic attacks: Lexapro 10 mg daily at bedtime, trazodone 100 mg daily at bedtime, Klonopin 0.5 mg daily at bedtime and 3 times a day as needed 5. Neuropsych: This patient is not fully capable of making decisions on her own behalf. 6. Skin/Wound Care: Routine skin checks 7. Fluids/Electrolytes/Nutrition: Routine I&O  8. Dysphagia. Dysphagia #3 thin liquids. Tolerating thus far 9. PAF with RVR. Continue amiodarone, Aldactone 50 mg daily, Tenormin 12.5 mg twice a day. Appreciate Cards recs, signed off.  10. Acute on chronic diastolic congestive heart failure.   Lasix 80 mg daily, increased to 60 BID on 6/22 .  daily weights  Monitor for any signs of fluid overload, weights trending back down Filed Weights   05/22/16 0532 05/23/16 0459 05/24/16 0500  Weight: 105.824 kg (233 lb 4.8 oz) 105.9 kg (233 lb 7.5 oz) 105 kg (231 lb 7.7 oz)   -recheck BMET Monday 11. Diabetes mellitus with peripheral neuropathy. Check blood sugars before meals and at bedtime.   Improving control--titrate levemir to 30u daily  Continue to monitor 12. Leukocytosis: Trending down  Currently afebrile  WBCs 11.0 on 6/27  UA negative, U culture with multiple species  Chest x-ray from 6/21 showing interstitial edema and? Infiltrate/atelectasis (appears to have slightly improved from previous x-ray) 13. AKI  Creatinine 1.07 on 6/27 (stable)  Encourage fluid intake 14. Constipation: Improved 15. ABLA  Hb 11.0 on 6/27  Cont to monitor  LOS (  Days) 12 A FACE TO FACE EVALUATION WAS PERFORMED  Tiwana Chavis T 05/24/2016 9:13 AM

## 2016-05-24 NOTE — Progress Notes (Signed)
Physical Therapy Session Note  Patient Details  Name: Margaret [name] MRN: [id number] Date of Birth: [birth year]  Today's Date: 05/24/2016 PT Individual Time: 0806-0904 PT Individual Time Calculation (min): 58 min   Short Term Goals: Week 2:  PT Short Term Goal 1 (Week 2): Pt will perform bed mobility with consistent mod A PT Short Term Goal 2 (Week 2): Pt will perform bed <> chair transfers with mod A and maintain sternal precautions PT Short Term Goal 3 (Week 2): Pt will tolerate standing x 5-8 minutes at time for standing balance and gait training activities with mod A PT Short Term Goal 4 (Week 2): Pt will perform ambulation with appropriate AD x 15' with max A  Skilled Therapeutic Interventions/Progress Updates:    Pt received in bed & agreeable to PT, denying c/o pain. Provided max A to assist pt with donning new shirt & pants, required total A for BLE socks, shoes, AFO & LUE sling. Pt able to roll L with mod/max A utilizing RLE to push supine>sidelying, pt required max A for supine>sitting EOB. Pt able to complete sit>stand transfer from elevated bed height with Min A, but required max A for standing balance with use of hemiwalker. Pt able to complete stand pivot bed>w/c to L with hemiwalker & max A, requiring assistance from PT to advance LLE & cuing for sequencing. Transported pt to gym via w/c total A. Discussed d/c plans with pt who reports home is w/c accessible and her husband is currently installing a ramp. Discussed current DME needs & pt notes she already has a hospital bed. Educated pt on inability to propel w/c herself 2/2 sternal precautions. In w/c, utilized cybex kinetron for BLE reciprocal movements to address coordination & LLE strengthening. Pt able to generate small force with LLE during activity. At end of session pt transported back to room & left in w/c with all needs within reach & set up with breakfast tray.\  HR = 60 bpm, SpO2 = 97% on room air after kinetron  exercise.  Therapy Documentation Precautions:  Precautions Precautions: Fall, Sternal Precaution Comments: L hemiparesis; L neglect; L subluxed shoulder Required Braces or Orthoses: Other Brace/Splint (left shoulder taped) Restrictions Weight Bearing Restrictions: Yes LLE Weight Bearing: Weight bearing as tolerated Other Position/Activity Restrictions: sternal precautions  Pain: Pain Assessment Pain Assessment: No/denies pain   See Function Navigator for Current Functional Status.   Therapy/Group: Individual Therapy  [name] 05/24/2016, 7:52 AM

## 2016-05-24 NOTE — Progress Notes (Signed)
ANTICOAGULATION CONSULT NOTE - Follow Up Consult  Pharmacy Consult for coumadin Indication: MVR/PAF  Allergies  Allergen Reactions  . Codeine Hives, Itching and Other (See Comments)    "breathing problems"    Patient Measurements: Weight: 231 lb 7.7 oz (105 kg)  Vital Signs: Temp: 97.8 F (36.6 C) (07/02 0507) Temp Source: Oral (07/02 0507) BP: 143/50 mmHg (07/02 0507) Pulse Rate: 61 (07/02 0507)  Labs:  Recent Labs  05/22/16 0515 05/23/16 0414 05/24/16 0510  LABPROT 29.3* 28.8* 32.1*  INR 2.83* 2.76* 3.20*    Estimated Creatinine Clearance: 67.3 mL/min (by C-G formula based on Cr of 1.07).  Assessment: 60 yo female on warfarin for MVR/PAF - Goal 2.5-3.5 per CVTS d/c summary on 6/16. INR remains therapeutic at 3.2 today but has trended up significantly overnight. No overt bleeding noted. Better po intake, continue on amiodarone, dose decreased from 400 mg BID to 200 mg daily on 6/26.    Goal of Therapy:  INR 2.5-3.5 Monitor platelets by anticoagulation protocol: Yes   Plan:  Coumadin 2.5 mg PO x 1 tonight  Daily INR Monitor CBC, s/sx bleeding  Jalea Bronaugh K. Velva Harman, PharmD, BCPS, CPP Clinical Pharmacist Pager: [phone] Phone: [phone] 05/24/2016 9:14 AM

## 2016-05-25 ENCOUNTER — Inpatient Hospital Stay (HOSPITAL_COMMUNITY): Payer: Self-pay | Admitting: Occupational Therapy

## 2016-05-25 ENCOUNTER — Inpatient Hospital Stay (HOSPITAL_COMMUNITY): Payer: Medicaid Other | Admitting: Physical Therapy

## 2016-05-25 ENCOUNTER — Inpatient Hospital Stay (HOSPITAL_COMMUNITY): Payer: Medicaid Other | Admitting: Speech Pathology

## 2016-05-25 LAB — BASIC METABOLIC PANEL
ANION GAP: 19 — AB (ref 5–15)
BUN: 15 mg/dL (ref 6–20)
CALCIUM: 12 mg/dL — AB (ref 8.9–10.3)
CO2: 28 mmol/L (ref 22–32)
CREATININE: 0.96 mg/dL (ref 0.44–1.00)
Chloride: 93 mmol/L — ABNORMAL LOW (ref 101–111)
GFR calc Af Amer: >60 mL/min (ref >60)
GLUCOSE: 143 mg/dL — AB (ref 65–99)
Potassium: 4.2 mmol/L (ref 3.5–5.1)
Sodium: 140 mmol/L (ref 135–145)

## 2016-05-25 LAB — CBC
HEMATOCRIT: 39.9 % (ref 36.0–46.0)
Hemoglobin: 11.7 g/dL — ABNORMAL LOW (ref 12.0–15.0)
MCH: 25.9 pg — ABNORMAL LOW (ref 26.0–34.0)
MCHC: 29.3 g/dL — ABNORMAL LOW (ref 30.0–36.0)
MCV: 88.5 fL (ref 78.0–100.0)
PLATELETS: 543 10*3/uL — AB (ref 150–400)
RBC: 4.51 MIL/uL (ref 3.87–5.11)
RDW: 17.3 % — ABNORMAL HIGH (ref 11.5–15.5)
WBC: 10.5 10*3/uL (ref 4.0–10.5)

## 2016-05-25 LAB — GLUCOSE, CAPILLARY
GLUCOSE-CAPILLARY: 133 mg/dL — AB (ref 65–99)
GLUCOSE-CAPILLARY: 132 mg/dL — AB (ref 65–99)
GLUCOSE-CAPILLARY: 175 mg/dL — AB (ref 65–99)
Glucose-Capillary: 127 mg/dL — ABNORMAL HIGH (ref 65–99)

## 2016-05-25 LAB — PROTIME-INR
INR: 3.21 — AB (ref 0.00–1.49)
PROTHROMBIN TIME: 32.2 s — AB (ref 11.6–15.2)

## 2016-05-25 MED ORDER — WARFARIN SODIUM 2.5 MG PO TABS
2.5000 mg | ORAL_TABLET | Freq: Once | ORAL | Status: AC
  Administered 2016-05-25: 2.5 mg via ORAL
  Filled 2016-05-25: qty 1

## 2016-05-25 NOTE — Progress Notes (Signed)
Occupational Therapy Session Note  Patient Details  Name: Margaret [name] MRN: [id number] Date of Birth: 1956/11/10  Today's Date: 05/25/2016 OT Individual Time: 1300-1401 OT Individual Time Calculation (min): 61 min    Short Term Goals: Week 2:  OT Short Term Goal 1 (Week 2): Pt will be educated on tub/shower transfers using tub transfer bench OT Short Term Goal 2 (Week 2): Pt will be min assist with stand pivot transfer w/c to/from [facility] OT Short Term Goal 3 (Week 2): Pt will be educated on a LUE HEP  OT Short Term Goal 4 (Week 2): Pt will perform sit to/from stands with min assist as a precursor for ADLs   Skilled Therapeutic Interventions/Progress Updates:    Pt completed practice with shower/tub transfers using the wheelchair and tub bench, in the ADL apartment.  Max assist for stand pivot transfers and stand step transfers from wheelchair to tub bench.  Max assist to lift the LLE into the tub with supervision for the right.  Min assist for scooting over onto the seat further.  Max assist also needed for bringing the LLE out of the tub with min assist for sit to stand from the elevated tub bench, with max assist to stand step around to the wheelchair.  Performed multiple intervals of stand step from wheelchair to tub bench and back with emphasis on weightshifting as well as advancing the LLE and maintaining weightbearing over the LLE when stepping with the right.  Max assist for stand pivot intervals.  Pt reports that her wheelchair will have to face the tub and she will have to complete a 180 degree turn to get onto the tub bench.  Pt understands that her husband cannot safely assist with this at this time.  Her goal is to get where she can step and turn with greater independence to complete this.  Pt returned to room with call button and phone in reach.    Therapy Documentation Precautions:  Precautions Precautions: Fall, Sternal Precaution Comments: L hemiparesis; L neglect; L  subluxed shoulder Required Braces or Orthoses: Other Brace/Splint (left shoulder taped) Restrictions Weight Bearing Restrictions: No LLE Weight Bearing: Weight bearing as tolerated Other Position/Activity Restrictions: sternal precautions  Pain: Pain Assessment Pain Assessment: No/denies pain ADL: See Function Navigator for Current Functional Status.   Therapy/Group: Individual Therapy  Monette Omara OTR/L 05/25/2016, 3:38 PM

## 2016-05-25 NOTE — Progress Notes (Signed)
[facility]ILITATION     PROGRESS NOTE  Subjective/Complaints:  Pt states she had a really good weekend.  She had family over for most of the time and she appreciated that.  She notes synergistic movements.    ROS: Denies CP, SOB, nausea, vomiting, diarrhea.  Objective: Vital Signs: Blood pressure 130/65, pulse 67, temperature 98 F (36.7 C), temperature source Tympanic, resp. rate 18, weight 105.5 kg (232 lb 9.4 oz), SpO2 97 %. No results found.  Recent Labs  05/25/16 0412  WBC 10.5  HGB 11.7*  HCT 39.9  PLT 543*    Recent Labs  05/25/16 0412  NA 140  K 4.2  CL 93*  GLUCOSE 143*  BUN 15  CREATININE 0.96  CALCIUM 12.0*   CBG (last 3)   Recent Labs  05/24/16 1623 05/24/16 2110 05/25/16 0656  GLUCAP 171* 219* 133*    Wt Readings from Last 3 Encounters:  05/25/16 105.5 kg (232 lb 9.4 oz)  05/12/16 105.008 kg (231 lb 8 oz)  04/14/16 117.935 kg (260 lb)    Physical Exam:  BP 130/65 mmHg  Pulse 67  Temp(Src) 98 F (36.7 C) (Tympanic)  Resp 18  Wt 105.5 kg (232 lb 9.4 oz)  SpO2 97% Constitutional: She appears well-developed. Obese  HENT: Normocephalic and atraumatic.  Eyes: EOM are normal. Right eye exhibits no discharge. Left eye exhibits no discharge.  Cardiovascular: Regular Rate and rhythm. Respiratory: No respiratory distress. Effort normal.  GI: Soft. Bowel sounds are normal.  Musculoskeletal: She exhibits edema. Mild tenderness with PROM left shoulder. 1/2" sublux  Neurological: She is alert. A cranial nerve deficit is present.  Left facial weakness  Dysarthria.  Memory fair. RUE/RLE: 5/5 proximal to distal LUE 0/5 proximal to distal.  LLE: 2/5 proximal to distal. mAS 1/4 Left elbow extension Skin: Chest incision clean dry. Vascular changes bilateral lower extremities  Psychiatric: Her affect is blunt and flat. Her speech is delayed. She is slowed.    Assessment/Plan: 1. Functional deficits secondary to  mitral valve repair 04/30/2016 as well as recent history of subarachnoid hemorrhage with left-sided residual weakness and dysarthria which require 3+ hours per day of interdisciplinary therapy in a comprehensive inpatient rehab setting. Physiatrist is providing close team supervision and 24 hour management of active medical problems listed below. Physiatrist and rehab team continue to assess barriers to discharge/monitor patient progress toward functional and medical goals.  Function:  Bathing Bathing position   Position: Shower  Bathing parts Body parts bathed by patient: Left arm, Chest, Abdomen, Buttocks, Right upper leg, Left upper leg Body parts bathed by helper: Right arm, Left lower leg, Right lower leg, Back, Front perineal area  Bathing assist Assist Level: Touching or steadying assistance(Pt > 75%)      Upper Body Dressing/Undressing Upper body dressing   What is the patient wearing?: Pull over shirt/dress Bra - Perfomed by patient: Thread/unthread right bra strap, Thread/unthread left bra strap, Hook/unhook bra (pull down sports bra)   Pull over shirt/dress - Perfomed by patient: Thread/unthread left sleeve, Put head through opening, Pull shirt over trunk, Thread/unthread right sleeve Pull over shirt/dress - Perfomed by helper: Thread/unthread right sleeve        Upper body assist Assist Level: Touching or steadying assistance(Pt > 75%) (supporting LUE)      Lower Body Dressing/Undressing Lower body dressing   What is the patient wearing?: Pants     Pants- Performed by patient: Thread/unthread right pants leg Pants- Performed by  helper: Thread/unthread left pants leg, Pull pants up/down       Socks - Performed by helper: Don/doff right sock, Don/doff left sock Shoes - Performed by patient: Don/doff right shoe, Fasten right, Fasten left Shoes - Performed by helper: Don/doff left shoe   AFO - Performed by helper: Don/doff left AFO      Lower body assist Assist for  lower body dressing: Touching or steadying assistance (Pt > 75%)      Toileting Toileting Toileting activity did not occur: No continent bowel/bladder event Toileting steps completed by patient: Adjust clothing after toileting Toileting steps completed by helper: Adjust clothing prior to toileting, Performs perineal hygiene, Adjust clothing after toileting    Toileting assist Assist level: Two helpers   Transfers Chair/bed transfer   Chair/bed transfer method: Stand pivot Chair/bed transfer assist level: Maximal assist (Pt 25 - 49%/lift and lower) Chair/bed transfer assistive device: Other (hemiwalker) Mechanical lift: Ecologist     Max distance: 31ft Assist level: Maximal assist (Pt 25 - 49%)   Wheelchair   Type: Manual Max wheelchair distance: 75 Assist Level: Dependent (Pt equals 0%)  Cognition Comprehension Comprehension assist level: Understands basic 90% of the time/cues < 10% of the time  Expression Expression assist level: Expresses complex 90% of the time/cues < 10% of the time  Social Interaction Social Interaction assist level: Interacts appropriately with others with medication or extra time (anti-anxiety, antidepressant).  Problem Solving Problem solving assist level: Solves basic 90% of the time/requires cueing < 10% of the time  Memory Memory assist level: Recognizes or recalls 75 - 89% of the time/requires cueing 10 - 24% of the time    Medical Problem List and Plan: 1. 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 Sternal precautions after mitral valve repair  Continue CIR  PRAFO, hand splints   Extensor tone in LUE, however, this appears to be functional at present, will consider treatment if necessary 2. DVT Prophylaxis/Anticoagulation: Coumadin therapy after mitral valve repair  INR Therapeutic today. Pharmacy  protocol  3. Pain Management: Oxycodone as needed  -muscle rub for left shoulder pain,mild sublux  -pt using sling when up with therapy  -keep left arm elevated while in bed/chair 4. Mood/panic attacks: Lexapro 10 mg daily at bedtime, trazodone 100 mg daily at bedtime, Klonopin 0.5 mg daily at bedtime and 3 times a day as needed 5. Neuropsych: This patient is not fully capable of making decisions on her own behalf. 6. Skin/Wound Care: Routine skin checks 7. Fluids/Electrolytes/Nutrition: Routine I&O  8. Dysphagia. Dysphagia #3 thin liquids. Tolerating 9. PAF with RVR. Continue amiodarone, Aldactone 50 mg daily, Tenormin 12.5 mg twice a day. Appreciate Cards recs, signed off.  10. Acute on chronic diastolic congestive heart failure.   Lasix 80 mg daily, increased to 60 BID on 6/22 .  daily weights  Monitor for any signs of fluid overload, weights stable  Filed Weights   05/23/16 0459 05/24/16 0500 05/25/16 0643  Weight: 105.9 kg (233 lb 7.5 oz) 105 kg (231 lb 7.7 oz) 105.5 kg (232 lb 9.4 oz)  11. Diabetes mellitus with peripheral neuropathy. Check blood sugars before meals and at bedtime.   Levemir increased to 30u daily on 7/2  Continue to monitor 12. Leukocytosis: Trending down  Currently afebrile  WBCs 10.5 on 7/3  UA negative, U culture with multiple species  Chest x-ray from 6/21 showing interstitial edema  and? Infiltrate/atelectasis (appears to have slightly improved from previous x-ray) 13. AKI: Resolved  Creatinine 0.96 on 7/3   Encourage fluid intake 14. Constipation: Improved 15. ABLA  Hb 11.7 on 7/3  Cont to monitor  LOS (Days) 13 A FACE TO FACE EVALUATION WAS PERFORMED  Ankit Lorie Phenix 05/25/2016 9:15 AM

## 2016-05-25 NOTE — Progress Notes (Signed)
ANTICOAGULATION CONSULT NOTE - Follow Up Consult  Pharmacy Consult for coumadin Indication: MV repair / AF  Allergies  Allergen Reactions  . Codeine Hives, Itching and Other (See Comments)    "breathing problems"    Patient Measurements: Weight: 232 lb 9.4 oz (105.5 kg) Heparin Dosing Weight:   Vital Signs: Temp: 98 F (36.7 C) (07/03 0643) Temp Source: Tympanic (07/03 0643) BP: 130/65 mmHg (07/03 0643) Pulse Rate: 67 (07/03 0643)  Labs:  Recent Labs  05/23/16 0414 05/24/16 0510 05/25/16 0412  HGB  --   --  11.7*  HCT  --   --  39.9  PLT  --   --  543*  LABPROT 28.8* 32.1* 32.2*  INR 2.76* 3.20* 3.21*  CREATININE  --   --  0.96    Estimated Creatinine Clearance: 75.2 mL/min (by C-G formula based on Cr of 0.96).   Medications:  Scheduled:  . amiodarone  200 mg Oral Daily  . aspirin EC  81 mg Oral Daily  . carvedilol  3.125 mg Oral BID WC  . clonazePAM  0.5 mg Oral QHS  . escitalopram  10 mg Oral QHS  . famotidine  20 mg Oral BID  . feeding supplement (GLUCERNA SHAKE)  237 mL Oral BID BM  . furosemide  40 mg Oral BID  . insulin aspart  0-20 Units Subcutaneous TID WC  . insulin detemir  30 Units Subcutaneous Daily  . nystatin   Topical BID  . senna-docusate  2 tablet Oral BID  . spironolactone  50 mg Oral Daily  . traZODone  100 mg Oral QHS  . Warfarin - Pharmacist Dosing Inpatient   Does not apply q1800   Infusions:    Assessment: 60 yo female s/p MV repair and with afib is currently on therapeutic coumadin.  INR today is stable at 3.21.  Goal of Therapy:  INR 2.5-3.5 Monitor platelets by anticoagulation protocol: Yes   Plan:  Coumadin 2.5 mg PO x 1 tonight (May need 2.5 mg few days a week and then 5 mg the rest) Daily INR Monitor CBC, s/sx bleeding  Aarit Kashuba, [name] 05/25/2016,8:23 AM

## 2016-05-25 NOTE — Progress Notes (Signed)
Speech Language Pathology Daily Session Note  Patient Details  Name: Margaret [name] MRN: [id number] Date of Birth: 1956-04-26  Today's Date: 05/25/2016 SLP Individual Time: 0900-1000 SLP Individual Time Calculation (min): 60 min  Short Term Goals: Week 2: SLP Short Term Goal 1 (Week 2): Pt will consume regular textures and thin liquids during a meal with mod I use of swallowing precautions to clear residuals from the oral cavity post swallow.  SLP Short Term Goal 2 (Week 2): Patient will utilize speech intelligibility strategies at the conversational level with mod I.   SLP Short Term Goal 3 (Week 2): Patient will utilize external aids to assist with recall of new, daily information with supervision verbal cues  SLP Short Term Goal 4 (Week 2): Patient will solve problems during completion of familiar tasks with mod I to self-monitor and correct errors for 100% accuracy.   Skilled Therapeutic Interventions: Pt completed complex functional math word problems with occasional min A to introduce compensatory strategies- then pt able to solve problems at mod I with use of compensatory strategies. 100% acc with following multi-step multi-component directives using rehearsal and visualization as compensatory strategies. Pt with reasonable and appropriate answers to safety questions for the home environment. Pt verbalized agreement with need for compensatory strategies with goals of completing tasks at mod I level.   Function:  Eating Eating   Modified Consistency Diet: No Eating Assist Level: Set up assist for   Eating Set Up Assist For: Opening containers       Cognition Comprehension Comprehension assist level: Understands complex 90% of the time/cues 10% of the time  Expression   Expression assist level: Expresses complex 90% of the time/cues < 10% of the time  Social Interaction Social Interaction assist level: Interacts appropriately with others with medication or extra time  (anti-anxiety, antidepressant).  Problem Solving Problem solving assist level: Solves complex 90% of the time/cues < 10% of the time  Memory Memory assist level: Recognizes or recalls 75 - 89% of the time/requires cueing 10 - 24% of the time    Pain Pain Assessment Pain Assessment: No/denies pain   Therapy/Group: Individual Therapy  Vinetta Bergamo MA, CCC-SLP 05/25/2016, 12:19 PM

## 2016-05-25 NOTE — Progress Notes (Signed)
Physical Therapy Session Note  Patient Details  Name: Margaret [name] MRN: [id number] Date of Birth: 12-28-1955  Today's Date: 05/25/2016 PT Individual Time: 1017-1130 PT Individual Time Calculation (min): 73 min   Short Term Goals: Week 2:  PT Short Term Goal 1 (Week 2): Pt will perform bed mobility with consistent mod A PT Short Term Goal 2 (Week 2): Pt will perform bed <> chair transfers with mod A and maintain sternal precautions PT Short Term Goal 3 (Week 2): Pt will tolerate standing x 5-8 minutes at time for standing balance and gait training activities with mod A PT Short Term Goal 4 (Week 2): Pt will perform ambulation with appropriate AD x 15' with max A  Skilled Therapeutic Interventions/Progress Updates:    Pt received in recliner & agreeable to PT treatment, denying c/o pain. Pt requested to get dressed. Assisted pt with doffing old shirt & donning new shirt, pants, socks & shoes total A. Pt transferred sit>stand from recliner to standing in stedy with mod A to complete donning pants. Pt brushed hair at sink from w/c level independently with RUE. Transported pt room>ortho gym in w/c via total A 2/2 sternal precautions. Pt inquired about gait belt use when performing functional tasks at home with husband & educated pt on use of gait belt & positioning it below her sternal incision; pt voiced understanding. Verbally educated & demonstrated to pt stand pivot transfer w/c<>car with hemiwalker. Pt able to transfer sit>stand with mod A but required max A for weight shifting & advancement of LLE to pivot over to car. Pt required total A for transferring LLE in/out of car and cuing to not reach up for simulated rail in car 2/2 precautions. To transfer out of car (sit>stand) pt required multiple trials but able to complete it with max A & cuing to shift pelvis anteriorly. Therapist continued to provide max A for weight shifting, verbal cuing for stepping pattern for transfer, and placement of  LLE. (HR = 56 bpm, SpO2 = 98% after task) Pt transported back to room & discussed home set up; pt reports doorways are 29 inches wide but pt currently will require a 22 inch wide w/c that is 30 inches wheel-to-wheel. Informed pt that we would continue to problem solve this issue. Pt left in w/c with all needs within reach.   Therapy Documentation Precautions:  Precautions Precautions: Fall, Sternal Precaution Comments: L hemiparesis; L neglect; L subluxed shoulder Required Braces or Orthoses: Other Brace/Splint (left shoulder taped) Restrictions Weight Bearing Restrictions: Yes LLE Weight Bearing: Weight bearing as tolerated Other Position/Activity Restrictions: sternal precautions  Pain: Pain Assessment Pain Assessment: No/denies pain   See Function Navigator for Current Functional Status.   Therapy/Group: Individual Therapy  [name] 05/25/2016, 7:52 AM

## 2016-05-25 NOTE — Progress Notes (Signed)
Nutrition Follow-up  DOCUMENTATION CODES:   Obesity unspecified  INTERVENTION:  Continue Glucerna Shake po BID, each supplement provides 220 kcal and 10 grams of protein.  Encourage adequate PO intake.   NUTRITION DIAGNOSIS:   Increased nutrient needs related to chronic illness as evidenced by estimated needs; ongoing  GOAL:   Patient will meet greater than or equal to 90% of their needs; met  MONITOR:   PO intake, Supplement acceptance, Weight trends, Labs, I & O's  REASON FOR ASSESSMENT:   Malnutrition Screening Tool    ASSESSMENT:   60 y.o. 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 Presented 04/23/2016 plan mitral valve surgery and underwent mitral valve repair 04/30/2016 Presents with debility after mitral valve repair, h/o r CVA.  Meal completion has been 80-100%. Pt currently has Glucerna ordered and has been consuming most of them. RD to continue with current orders to aid in adequate protein needs. Plans to provide diet education at next visit prior to discharge per pt request.   Labs and medications reviewed.   Diet Order:  Diet Carb Modified Fluid consistency:: Thin; Room service appropriate?: Yes  Skin:  Reviewed, no issues  Last BM:  7/1  Height:   Ht Readings from Last 1 Encounters:  04/30/16 _0  (1.651 m)    Weight:   Wt Readings from Last 1 Encounters:  05/25/16 232 lb 9.4 oz (105.5 kg)    Ideal Body Weight:  56.8 kg  BMI:  Body mass index is 38.7 kg/(m^2).  Estimated Nutritional Needs:   Kcal:  1800-1950  Protein:  95-105 grams  Fluid:  Per  MD  EDUCATION NEEDS:   No education needs identified at this time  Corrin Parker, MS, RD, LDN Pager # [phone] After hours/ weekend pager # [phone]

## 2016-05-26 ENCOUNTER — Inpatient Hospital Stay (HOSPITAL_COMMUNITY): Payer: Medicaid Other | Admitting: Physical Therapy

## 2016-05-26 ENCOUNTER — Inpatient Hospital Stay (HOSPITAL_COMMUNITY): Payer: Medicaid Other | Admitting: Speech Pathology

## 2016-05-26 ENCOUNTER — Inpatient Hospital Stay (HOSPITAL_COMMUNITY): Payer: Self-pay | Admitting: Occupational Therapy

## 2016-05-26 LAB — GLUCOSE, CAPILLARY
GLUCOSE-CAPILLARY: 164 mg/dL — AB (ref 65–99)
GLUCOSE-CAPILLARY: 225 mg/dL — AB (ref 65–99)
GLUCOSE-CAPILLARY: 113 mg/dL — AB (ref 65–99)
Glucose-Capillary: 174 mg/dL — ABNORMAL HIGH (ref 65–99)

## 2016-05-26 LAB — PROTIME-INR
INR: 2.72 — AB (ref 0.00–1.49)
PROTHROMBIN TIME: 28.4 s — AB (ref 11.6–15.2)

## 2016-05-26 MED ORDER — WARFARIN SODIUM 5 MG PO TABS
5.0000 mg | ORAL_TABLET | Freq: Once | ORAL | Status: AC
  Administered 2016-05-26: 5 mg via ORAL
  Filled 2016-05-26: qty 1

## 2016-05-26 NOTE — Progress Notes (Signed)
[facility]IC[facility] RE[facility]ILITATION     PROGRESS NOTE  Subjective/Complaints:  Pt laying in bed this AM.  She slept well overnight, but is still sleepy this AM.  She cannot think of any complaints.   ROS: Denies CP, SOB, nausea, vomiting, diarrhea.  Objective: Vital Signs: Blood pressure 129/66, pulse 64, temperature 98.2 F (36.8 C), temperature source Oral, resp. rate 18, weight 105.688 kg (233 lb), SpO2 92 %. No results found.  Recent Labs  05/25/16 0412  WBC 10.5  HGB 11.7*  HCT 39.9  PLT 543*    Recent Labs  05/25/16 0412  NA 140  K 4.2  CL 93*  GLUCOSE 143*  BUN 15  CREATININE 0.96  CALCIUM 12.0*   CBG (last 3)   Recent Labs  05/25/16 1649 05/25/16 2131 05/26/16 0643  GLUCAP 127* 175* 164*    Wt Readings from Last 3 Encounters:  05/26/16 105.688 kg (233 lb)  05/12/16 105.008 kg (231 lb 8 oz)  04/14/16 117.935 kg (260 lb)    Physical Exam:  BP 129/66 mmHg  Pulse 64  Temp(Src) 98.2 F (36.8 C) (Oral)  Resp 18  Wt 105.688 kg (233 lb)  SpO2 92% Constitutional: She appears well-developed. Obese  HENT: Normocephalic and atraumatic.  Eyes: EOM are normal. Right eye exhibits no discharge. Left eye exhibits no discharge.  Cardiovascular: Regular Rate and rhythm. Respiratory: No respiratory distress. Effort normal.  GI: Soft. Bowel sounds are normal.  Musculoskeletal: She exhibits edema. Mild tenderness with PROM left shoulder. 1/2" sublux  Neurological: She is alert. A cranial nerve deficit is present.  Left facial weakness  Dysarthria.  Memory fair. RUE/RLE: 5/5 proximal to distal LUE 0/5 proximal to distal.  LLE: 2/5 proximal to distal. mAS 1/4 Left elbow extension Skin: Chest incision clean dry. Vascular changes bilateral lower extremities  Psychiatric: Her affect is blunt and flat (improving). Her speech is delayed. She is slowed.    Assessment/Plan: 1. Functional deficits secondary to mitral valve repair 04/30/2016 as  well as recent history of subarachnoid hemorrhage with left-sided residual weakness and dysarthria which require 3+ hours per day of interdisciplinary therapy in a comprehensive inpatient rehab setting. Physiatrist is providing close team supervision and 24 hour management of active medical problems listed below. Physiatrist and rehab team continue to assess barriers to discharge/monitor patient progress toward functional and medical goals.  Function:  Bathing Bathing position   Position: Shower  Bathing parts Body parts bathed by patient: Left arm, Chest, Abdomen, Buttocks, Right upper leg, Left upper leg Body parts bathed by helper: Right arm, Left lower leg, Right lower leg, Back, Front perineal area  Bathing assist Assist Level: Touching or steadying assistance(Pt > 75%)      Upper Body Dressing/Undressing Upper body dressing   What is the patient wearing?: Pull over shirt/dress Bra - Perfomed by patient: Thread/unthread right bra strap, Thread/unthread left bra strap, Hook/unhook bra (pull down sports bra)   Pull over shirt/dress - Perfomed by patient: Thread/unthread left sleeve, Put head through opening, Pull shirt over trunk, Thread/unthread right sleeve Pull over shirt/dress - Perfomed by helper: Thread/unthread right sleeve        Upper body assist Assist Level: Touching or steadying assistance(Pt > 75%) (supporting LUE)      Lower Body Dressing/Undressing Lower body dressing   What is the patient wearing?: Pants     Pants- Performed by patient: Thread/unthread right pants leg Pants- Performed by helper: Thread/unthread left pants leg, Pull pants up/down  Socks - Performed by helper: Don/doff right sock, Don/doff left sock Shoes - Performed by patient: Don/doff right shoe, Fasten right, Fasten left Shoes - Performed by helper: Don/doff left shoe   AFO - Performed by helper: Don/doff left AFO      Lower body assist Assist for lower body dressing: Touching or  steadying assistance (Pt > 75%)      Toileting Toileting Toileting activity did not occur: No continent bowel/bladder event Toileting steps completed by patient: Adjust clothing after toileting Toileting steps completed by helper: Adjust clothing prior to toileting, Performs perineal hygiene, Adjust clothing after toileting Toileting Assistive Devices: Grab bar or rail  Toileting assist Assist level: Two helpers   Transfers Chair/bed transfer   Chair/bed transfer method: Stand pivot Chair/bed transfer assist level: Maximal assist (Pt 25 - 49%/lift and lower) Chair/bed transfer assistive device: Other (hemiwalker) Mechanical lift: Ecologist     Max distance: 85ft Assist level: Maximal assist (Pt 25 - 49%)   Wheelchair   Type: Manual Max wheelchair distance: 75 Assist Level: Dependent (Pt equals 0%)  Cognition Comprehension Comprehension assist level: Understands complex 90% of the time/cues 10% of the time  Expression Expression assist level: Expresses complex 90% of the time/cues < 10% of the time  Social Interaction Social Interaction assist level: Interacts appropriately with others with medication or extra time (anti-anxiety, antidepressant).  Problem Solving Problem solving assist level: Solves complex 90% of the time/cues < 10% of the time  Memory Memory assist level: Recognizes or recalls 75 - 89% of the time/requires cueing 10 - 24% of the time    Medical Problem List and Plan: 1. 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 Sternal precautions after mitral valve repair  Continue CIR  PRAFO, hand splints   Extensor tone in LUE, however, this appears to be functional at present, will consider treatment if necessary 2. DVT Prophylaxis/Anticoagulation: Coumadin therapy after mitral valve repair  INR Therapeutic today.  Pharmacy protocol  3. Pain Management: Oxycodone as needed  -muscle rub for left shoulder pain,mild sublux  -pt using sling when up with therapy  -keep left arm elevated while in bed/chair 4. Mood/panic attacks: Lexapro 10 mg daily at bedtime, trazodone 100 mg daily at bedtime, Klonopin 0.5 mg daily at bedtime and 3 times a day as needed 5. Neuropsych: This patient is not fully capable of making decisions on her own behalf. 6. Skin/Wound Care: Routine skin checks 7. Fluids/Electrolytes/Nutrition: Routine I&O  8. Dysphagia. Dysphagia #3 thin liquids. Tolerating 9. PAF with RVR. Continue amiodarone, Aldactone 50 mg daily, Tenormin 12.5 mg twice a day. Appreciate Cards recs, signed off.  10. Acute on chronic diastolic congestive heart failure.   Lasix 80 mg daily, increased to 60 BID on 6/22 .  daily weights  Monitor for any signs of fluid overload, weights stable  Filed Weights   05/24/16 0500 05/25/16 0643 05/26/16 0601  Weight: 105 kg (231 lb 7.7 oz) 105.5 kg (232 lb 9.4 oz) 105.688 kg (233 lb)  11. Diabetes mellitus with peripheral neuropathy. Check blood sugars before meals and at bedtime.   Levemir increased to 30u daily on 7/2  Continue to monitor 12. Leukocytosis: Trending down  Currently afebrile  WBCs 10.5 on 7/3  UA negative, U culture with multiple species  Chest x-ray from 6/21 showing interstitial edema and? Infiltrate/atelectasis (appears to have slightly improved from previous x-ray) 13. AKI: Resolved  Creatinine 0.96 on 7/3   Encourage fluid intake 14. Constipation: Improved 15. ABLA  Hb 11.7 on 7/3  Cont to monitor  LOS (Days) 14 A FACE TO FACE EVALUATION WAS PERFORMED  Ankit Lorie Phenix 05/26/2016 8:54 AM

## 2016-05-26 NOTE — Progress Notes (Signed)
ANTICOAGULATION CONSULT NOTE - Follow Up Consult  Pharmacy Consult:  Coumadin Indication: MV repair / AF  Allergies  Allergen Reactions  . Codeine Hives, Itching and Other (See Comments)    "breathing problems"    Patient Measurements: Weight: 233 lb (105.688 kg)   Vital Signs: Temp: 98.2 F (36.8 C) (07/04 0601) Temp Source: Oral (07/04 0601) BP: 129/66 mmHg (07/04 0601) Pulse Rate: 64 (07/04 0601)  Labs:  Recent Labs  05/24/16 0510 05/25/16 0412 05/26/16 0526  HGB  --  11.7*  --   HCT  --  39.9  --   PLT  --  543*  --   LABPROT 32.1* 32.2* 28.4*  INR 3.20* 3.21* 2.72*  CREATININE  --  0.96  --     Estimated Creatinine Clearance: 75.3 mL/min (by C-G formula based on Cr of 0.96).   Assessment: 60 yo female s/p MV repair and with afib to continue on Coumadin.  INR therapeutic and trending down.  No bleeding reported.   Goal of Therapy:  INR 2.5-3.5    Plan:  Coumadin 5mg  PO today Daily INR Monitor CBC, s/sx bleeding   Gillie Fleites D. Mina Marble, PharmD, BCPS Pager:  [phone] 05/26/2016, 10:50 AM

## 2016-05-26 NOTE — Progress Notes (Signed)
Occupational Therapy Session Note  Patient Details  Name: Margaret [name] MRN: [id number] Date of Birth: 16-Jan-1956  Today's Date: 05/26/2016 OT Individual Time:  - [id number] minutes     Skilled Therapeutic Interventions/Progress Updates:    Pt completed ADLs w/c level at sink with setup with increased focus placed on decreasing LOA during standing portions of self care tasks. With verbal cuing to lean anterioraly without UE support, pt completed sit to stand with Min A at sink. With Mod A for balance and manual facilitation of weight shift pt was able to complete pericare and pull LB garments over hips on right side while standing. Pt completed toileting transfer with use of grab bars and 2 helpers for safety and raised toilet seat. Pt exhibited overextension of left knee which required manual cues for positioning during transfer. Pt was able to complete hygiene mgt with Mod A for standing balance. During LB dressing, pt was provided instruction on lifting L LE into LB garments and propping L LE up on knee. Skilled OT left pt in w/c at end of tx with PT. AFO donned, arm trough in place. Pts husband provided door measurements with pt reporting that w/c will not fit inside of bathroom or kitchen. Per pt report, pt will reside in the living room post d/c with husband assistance as needed. Pt benefits from continued d/c planning to ensure safe d/c with self care needs accessible in one room. No pain reported today.   Therapy Documentation Precautions:  Precautions Precautions: Fall, Sternal Precaution Comments: L hemiparesis; L neglect; L subluxed shoulder Required Braces or Orthoses: Other Brace/Splint (left shoulder taped) Restrictions Weight Bearing Restrictions: Yes (sternal precatuions) LLE Weight Bearing: Weight bearing as tolerated Other Position/Activity Restrictions: sternal precautions General:   Vital Signs: Therapy Vitals Temp: 97.8 F (36.6 C) Temp Source: Oral Pulse  Rate: 62 Resp: 19 BP: (!) 141/69 mmHg Patient Position (if appropriate): Sitting Oxygen Therapy SpO2: 95 % O2 Device: Not Delivered     See Function Navigator for Current Functional Status.   Therapy/Group: Individual Therapy  Dominion Kathan A Jamesyn Lindell 05/26/2016, 4:54 PM

## 2016-05-26 NOTE — Progress Notes (Signed)
Physical Therapy Weekly Progress Note  Patient Details  Name: Margaret [name] MRN: [id number] Date of Birth: June 07, 1956  Beginning of progress report period: May 20, 2016 End of progress report period: May 27, 2016  Today's Date: 05/27/2016 PT Individual Time: 0803-0902 PT Individual Time Calculation (min): 59 min   Patient has met 1 of 4 short term goals.  Pt has progressed to ambulating with R rail in hallway with Max A & w/c follow for safety. Pt is able to initiate stepping with LLE 2/2 quad activation. Pt utilizes her own verbal feedback for weight shifting and foot advancement. Pt has progressed to stair training on 3 inch steps with +2 for safety for BLE strengthening. Pt has progressed to Min A for sit<>stand transfers with improved anterior weight shift. Pt continues to be motivated to participate in skilled PT treatment. Pt would benefit from continued skilled PT to ensure safety in her home following d/c and for family education. Pt would also benefit from an orthotic consult.  Patient continues to demonstrate the following deficits: decreased LLE strength, decreased standing balance, continued need for significant assistance with ambulation and transfers and therefore will continue to benefit from skilled PT intervention to enhance overall performance with activity tolerance, balance, postural control, ability to compensate for deficits and awareness.  Patient progressing toward long term goals..  Continue plan of care.  PT Short Term Goals Week 2:  PT Short Term Goal 1 (Week 2): Pt will perform bed mobility with consistent mod A (currently mod A) PT Short Term Goal 1 - Progress (Week 2): Progressing toward goal PT Short Term Goal 2 (Week 2): Pt will perform bed <> chair transfers with mod A and maintain sternal precautions (max A +1) PT Short Term Goal 2 - Progress (Week 2): Progressing toward goal PT Short Term Goal 3 (Week 2): Pt will tolerate standing x 5-8 minutes at time  for standing balance and gait training activities with mod A PT Short Term Goal 3 - Progress (Week 2): Met PT Short Term Goal 4 (Week 2): Pt will perform ambulation with appropriate AD x 15' with max A (max A +  w/c follow (+2 for safety)) PT Short Term Goal 4 - Progress (Week 2): Progressing toward goal Week 3:  PT Short Term Goal 1 (Week 3): Pt will perform bed mobility consistently with mod A. PT Short Term Goal 2 (Week 3): Pt will perform bed<>chair transfers with Mod A & LRAD. PT Short Term Goal 3 (Week 3): Pt will ambulate 63f with appropriate AD & Mod A.  Skilled Therapeutic Interventions/Progress Updates:    Pt received in w/c in handoff from nursing. Pt agreeable to PT denying c/o pain. Transported pt down to gym via w/c total A 2/2 sternal precautions. Stair training completed on 3 inch steps (1 step + 2 steps) with R rail & max A with +2 for safety. Pt would verbally state sequence before completing portions of task. PT provided cuing for increased weight shifts L<>R and max A along with reverse tapping for LLE quad activation. Pt with difficulty flexing L hip, knee & ankle enough to clear step without max assistance. Pt greatly fatigued with activity & reported need to use restroom. Transported pt back to room & pt completed stand pivot w/c<>elevated toilet with max A for LLE advancement & placement & verbal cuing for sequencing; (+) BM. PT assisted pt with peri-hygiene total A while pt able to stand with steady A & grab bar. Discussed d/c  home and pt reported not being able to transfer into bathroom 2/2 small doorway. Discussed pt's potential ability to purchase stedy lift for bathroom access but pt unsure of this based on finances. At end of session pt left in w/c with all needs within reach.   Therapy Documentation Precautions:  Precautions Precautions: Fall, Sternal Precaution Comments: L hemiparesis; L neglect; L subluxed shoulder Required Braces or Orthoses: Other Brace/Splint  (left shoulder taped) Restrictions Weight Bearing Restrictions: No LLE Weight Bearing: Weight bearing as tolerated Other Position/Activity Restrictions: sternal precautions  Pain: Pain Assessment Pain Assessment: No/denies pain   See Function Navigator for Current Functional Status.  Therapy/Group: Individual Therapy  [name] 05/27/2016, 7:44 AM

## 2016-05-26 NOTE — Progress Notes (Signed)
Speech Language Pathology Weekly Progress and Session Note  Patient Details  Name: Margaret [name] MRN: [id number] Date of Birth: Mar 20, 1956  Beginning of progress report period: May 20, 2016 End of progress report period: May 26, 2016  Today's Date: 05/26/2016 SLP Individual Time: 0900-1000 SLP Individual Time Calculation (min): 60 min  Short Term Goals: Week 2: SLP Short Term Goal 1 (Week 2): Pt will consume regular textures and thin liquids during a meal with mod I use of swallowing precautions to clear residuals from the oral cavity post swallow.  SLP Short Term Goal 1 - Progress (Week 2): Met SLP Short Term Goal 2 (Week 2): Patient will utilize speech intelligibility strategies at the conversational level with mod I.   SLP Short Term Goal 2 - Progress (Week 2): Met SLP Short Term Goal 3 (Week 2): Patient will utilize external aids to assist with recall of new, daily information with supervision verbal cues  SLP Short Term Goal 3 - Progress (Week 2): Met SLP Short Term Goal 4 (Week 2): Patient will solve problems during completion of familiar tasks with mod I to self-monitor and correct errors for 100% accuracy.  SLP Short Term Goal 4 - Progress (Week 2): Other (comment)    New Short Term Goals: Week 3: SLP Short Term Goal 1 (Week 3): Patient will utilize external aids to assist with recall of new, daily information at mod I level. SLP Short Term Goal 2 (Week 3): Pt will complete daily complex problem solving tasks at mod I.  SLP Short Term Goal 3 (Week 3): Pt to complete medication management task with supervision level assist.  Weekly Progress Updates:  Pt met 4 of 4 STM goals for this treatment period with significant gains for increasing independence. Pt is tolerating a regular diet at mod I level and speech intelligibility is [facility]. Will continue to address high level cognitive limitations.    Intensity: Minumum of 1-2 x/day, 30 to 90 minutes Frequency: 3 to 5 out of 7  days Duration/Length of Stay: 18-22 days Treatment/Interventions: Cognitive remediation/compensation;Cueing hierarchy;Environmental controls;Functional tasks;Internal/external aids;Patient/family education;Therapeutic Activities   Daily Session  Skilled Therapeutic Interventions: Pt participated in goal setting for the next treatment period with min A. Pt able to identify several modalities that might be appropriately used as compensatory strategies for recall. Pt able to successfully able to enter data into her calendar on her phone with increased time and use of a magnifying glass. Pt able to recall accurate details of her day at mod I.      Function:   Eating Eating                 Cognition Comprehension Comprehension assist level: Understands complex 90% of the time/cues 10% of the time  Expression   Expression assist level: Expresses complex 90% of the time/cues < 10% of the time  Social Interaction Social Interaction assist level: Interacts appropriately with others with medication or extra time (anti-anxiety, antidepressant).  Problem Solving Problem solving assist level: Solves complex 90% of the time/cues < 10% of the time  Memory Memory assist level: Recognizes or recalls 90% of the time/requires cueing < 10% of the time   General    Pain Pain Assessment Pain Assessment: No/denies pain  Therapy/Group: Individual Therapy  Vinetta Bergamo MA, CCC-SLP 05/26/2016, 12:12 PM

## 2016-05-26 NOTE — Progress Notes (Signed)
Physical Therapy Session Note  Patient Details  Name: Margaret [name] MRN: [id number] Date of Birth: 12-30-1955  Today's Date: 05/26/2016 PT Individual Time: [id number] PT Individual Time Calculation (min): 75 min   Short Term Goals: Week 2:  PT Short Term Goal 1 (Week 2): Pt will perform bed mobility with consistent mod A PT Short Term Goal 2 (Week 2): Pt will perform bed <> chair transfers with mod A and maintain sternal precautions PT Short Term Goal 3 (Week 2): Pt will tolerate standing x 5-8 minutes at time for standing balance and gait training activities with mod A PT Short Term Goal 4 (Week 2): Pt will perform ambulation with appropriate AD x 15' with max A  Skilled Therapeutic Interventions/Progress Updates:    Pt received in handoff from OT with L AFO already donned; pt denied c/o pain. Transported pt down to gym for gait training in hallway. Pt able to ambulate 30 ft total in 3 bouts (8 ft + 7 ft + 15 ft) with R rail and mod/max A for standing balance. Provided reverse tapping to elicit quad activation in LLE; pt able to advance LLE but required assistance for placement of foot & approximation at L knee when stepping with RLE. Pt verbalized sequencing during gait training and was able to shift weight well. In gym pt performed forwards/backwards stepping with support from hemiwalker with activity focusing on use of AD and to increase weight bearing through LLE. Utilized cybex kinetron sitting in w/c for BLE strengthening & reciprocal movements. At end of session pt returned to room in w/c & left with all needs within reach.  Therapy Documentation Precautions:  Precautions Precautions: Fall, Sternal Precaution Comments: L hemiparesis; L neglect; L subluxed shoulder Required Braces or Orthoses: Other Brace/Splint (left shoulder taped) Restrictions Weight Bearing Restrictions: Yes (sternal precatuions) LLE Weight Bearing: Weight bearing as tolerated Other Position/Activity  Restrictions: sternal precautions  Pain: Pain Assessment Pain Assessment: No/denies pain   See Function Navigator for Current Functional Status.   Therapy/Group: Individual Therapy  [name] 05/26/2016, 12:49 PM

## 2016-05-27 ENCOUNTER — Inpatient Hospital Stay (HOSPITAL_COMMUNITY): Payer: Medicaid Other | Admitting: Physical Therapy

## 2016-05-27 ENCOUNTER — Inpatient Hospital Stay (HOSPITAL_COMMUNITY): Payer: Medicaid Other | Admitting: Occupational Therapy

## 2016-05-27 ENCOUNTER — Inpatient Hospital Stay (HOSPITAL_COMMUNITY): Payer: Medicaid Other | Admitting: Speech Pathology

## 2016-05-27 LAB — GLUCOSE, CAPILLARY
GLUCOSE-CAPILLARY: 140 mg/dL — AB (ref 65–99)
GLUCOSE-CAPILLARY: 146 mg/dL — AB (ref 65–99)
GLUCOSE-CAPILLARY: 145 mg/dL — AB (ref 65–99)
Glucose-Capillary: 228 mg/dL — ABNORMAL HIGH (ref 65–99)
Glucose-Capillary: 105 mg/dL — ABNORMAL HIGH (ref 65–99)

## 2016-05-27 LAB — PROTIME-INR
INR: 2.49 — ABNORMAL HIGH (ref 0.00–1.49)
Prothrombin Time: 26.6 seconds — ABNORMAL HIGH (ref 11.6–15.2)

## 2016-05-27 MED ORDER — WARFARIN SODIUM 5 MG PO TABS
5.0000 mg | ORAL_TABLET | Freq: Once | ORAL | Status: AC
  Administered 2016-05-27: 5 mg via ORAL
  Filled 2016-05-27: qty 1

## 2016-05-27 MED ORDER — INSULIN DETEMIR 100 UNIT/ML ~~LOC~~ SOLN
35.0000 [IU] | Freq: Every day | SUBCUTANEOUS | Status: DC
  Administered 2016-05-28 – 2016-05-30 (×3): 35 [IU] via SUBCUTANEOUS
  Filled 2016-05-27 (×3): qty 0.35

## 2016-05-27 NOTE — Progress Notes (Signed)
Orthopedic Tech Progress Note Patient Details:  Margaret [name] [birth year] [id number]  Patient ID: [name], female   DOB: [birth year], 60 y.o.   MRN: [id number] Called in advanced brace order; spoke Evangeline Dakin, Pharrah Rottman 05/27/2016, 11:38 AM

## 2016-05-27 NOTE — Progress Notes (Signed)
Occupational Therapy Session Note  Patient Details  Name: Margaret [name] MRN: [id number] Date of Birth: 31-Aug-1956  Today's Date: 05/27/2016 OT Individual Time: 1305-1400 OT Individual Time Calculation (min): 55 min    Short Term Goals: Week 2:  OT Short Term Goal 1 (Week 2): Pt will be educated on tub/shower transfers using tub transfer bench OT Short Term Goal 2 (Week 2): Pt will be min assist with stand pivot transfer w/c to/from [employer] OT Short Term Goal 3 (Week 2): Pt will be educated on a LUE HEP  OT Short Term Goal 4 (Week 2): Pt will perform sit to/from stands with min assist as a precursor for ADLs   Skilled Therapeutic Interventions/Progress Updates:  Upon entering the room, pt supine in bed with NT present in room and attempting to change pt's brief. OT having pt roll to L <> R with max A and use of bed rails in order to don clean brief. LB clothing performed supine in bed with total A. Supine >sit with max A to EOB. Stand pivot transfer into wheelchair with max A to the R. Pt seated in wheelchair at sink for grooming and washing face with set up A. OT providing paper handouts for energy conservation and briefly discussing with pt. Education to continue. Call bell and all needed items within reach. Friend present in room.   Therapy Documentation Precautions:  Precautions Precautions: Fall, Sternal Precaution Comments: L hemiparesis; L neglect; L subluxed shoulder Required Braces or Orthoses: Other Brace/Splint (left shoulder taped) Restrictions Weight Bearing Restrictions: No LLE Weight Bearing: Weight bearing as tolerated Other Position/Activity Restrictions: sternal precautions General:   Vital Signs:  Pain: Pain Assessment Pain Assessment: No/denies pain ADL:   Exercises:   Other Treatments:    See Function Navigator for Current Functional Status.   Therapy/Group: Individual Therapy  Phineas Semen 05/27/2016, 4:56 PM

## 2016-05-27 NOTE — Patient Care Conference (Signed)
Inpatient RehabilitationTeam Conference and Plan of Care Update Date: 05/27/2016   Time: 11:15 AM    Patient Name: Margaret [name]      Medical Record Number: [id number]  Date of Birth: 10/31/1956 Sex: Female         Room/Bed: 4W04C/4W04C-01 Payor Info: Payor: MEDICAID [place] / Plan: MEDICAID OF [place] / Product Type: *No Product type* /    Admitting Diagnosis: Milltralvalve Replacment HO CVA  Admit Date/Time:  05/12/2016  6:13 PM Admission Comments: No comment available   Primary Diagnosis:  Cerebrovascular accident (CVA) due to occlusion of right carotid artery ([place]) Principal Problem: Cerebrovascular accident (CVA) due to occlusion of right carotid artery [facility])  Patient Active Problem List   Diagnosis Date Noted  . Subtherapeutic anticoagulation   . PAF (paroxysmal atrial fibrillation) ([place])   . Hemiplegia of nondominant side, late effect of cerebrovascular disease ([place])   . 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 Hemiparesis and dysphasia as late effect of cerebrovascular accident (CVA) ([place])   . Flaccid hemiplegia affecting left nondominant side ([place])   . Interstitial edema   . Type 2 diabetes mellitus with peripheral neuropathy (HCC)   . Debilitated 05/12/2016  . Left hemiparesis ([place]) 05/12/2016  . History of subarachnoid hemorrhage   . SOB (shortness of breath)   . History of CVA with residual deficit   . Dysarthria, post-stroke   . Chronic obstructive pulmonary disease ([place])   . Tachypnea   . Bradycardia   . Hypokalemia   . Leukocytosis   . SIRS (systemic inflammatory response syndrome) (HCC)   . Acute blood loss anemia   . Thrombocytosis ([place])   . S/P mitral valve repair 04/30/2016  . 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 Acute on chronic diastolic congestive heart failure due to valvular disease ([place]) 04/14/2016  . CHF due to valvular disease, acute on chronic,  diastolic 76/73/4193  . Chronic diastolic congestive heart failure due to valvular disease ([place]) 04/13/2016  . History of stroke 04/13/2016  . Cerebrovascular accident (CVA) due to occlusion of right carotid artery ([place]) 03/25/2016  . Splenic infarct 03/25/2016  . Type 2 diabetes mellitus with circulatory disorder ([place]) 03/25/2016  . Mitral regurgitation 03/06/2016  . HLD (hyperlipidemia) 03/06/2016  . Right carotid artery occlusion   . Epigastric abdominal tenderness on direct palpation   . Endocarditis of mitral valve   . Positive ANA (antinuclear antibody)   . Streptococcus viridans infection 02/04/2016  . Cerebrovascular accident (CVA) due to embolism of cerebral artery ([place])   . Subarachnoid hemorrhage ([place]) 02/01/2016  . Stroke ([place])   . Visual changes   . Pulmonary infiltrates 01/23/2016  . Pulmonary hypertension assoc with unclear multi-factorial mechanisms ([place]) 01/23/2016  . 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 Insulin dependent diabetes mellitus ([place]) 01/14/2016  . Essential hypertension 01/14/2016  . Hepatic cirrhosis ([place]) 01/14/2016  . COPD (chronic obstructive pulmonary disease) ([place]) 04/27/2012  . Chronic diastolic CHF (congestive heart failure) ([place]) 04/27/2012  . Morbid obesity ([place]) 04/27/2012  . Hypoxia 04/27/2012  . Dyspnea on exertion 04/27/2012  . Smoker 04/27/2012    Expected Discharge Date: Expected Discharge Date: 06/04/16  Team Members Present: Physician leading conference: Dr. Delice Lesch Social Worker Present: Ovidio Kin, LCSW Nurse Present: Dorien Chihuahua, RN PT Present: Barrie Folk, PT;Victoria Sabra Heck, PT OT Present: Willeen Cass, OT SLP Present: Gunnar Fusi, SLP PPS Coordinator present : Daiva Nakayama, RN,  CRRN     Current Status/Progress Goal Weekly Team Focus  Medical   Debilitation secondary to mitral valve repair 04/30/2016 as well as recent history of subarachnoid hemorrhage with left-sided residual weakness and dysarthria.  Improve  safety, transfers, CBGs  See above   Bowel/Bladder   continued to be continent with incontinent episodes, mostly at HS  less episodes of incontinence with min assist  timed toileting   Swallow/Nutrition/ Hydration   regular, mod I  n/a- goals met      ADL's   transfers min to mod A , bathing and dressing min to mod A, toileting max A  standing balance with min A, bathing min A, dressing min A and toilet transfers with mod A; toileting with mod A  transfer training, sit to standss, standing balance during functional tasks and family education   Mobility   max a for bed mobility, mod/max for sit<>Stand transfers, max A for stand pivot with hemiwalker, max A car transfer  mod a transfers (car, bed<>chair), min a bed mobility, min a standing balance  pt education, standing balance, transfers, strengthening   Communication   mod I for complex  Mod I       Safety/Cognition/ Behavioral Observations  supervision  mod I for high level  compensatory strategies for organization, reasoning, recall   Pain   has prn ultram & oxycodone, has had no ultram since 7/2 & no oxy since 6/23  pain scale level <2  continue to assess for pain & treat as needed   Skin   sternal incision healing well, scabs present, abdominal scabs also present, pink groin, antifungal powder being used as ordered  no further breakdown  continue to assess q shift       *See Care Plan and progress notes for long and short-term goals.  Barriers to Discharge: Safety, transfers, CHF, DM    Possible Resolutions to Barriers:  pt and family edu, follow labs, optimize DM meds, therapies    Discharge Planning/Teaching Needs:  Home with husband and friend to assist, coming in next week for three days for family training to prepare for discharge      Team Discussion:  Swallowing goals met and SP may DC after one more treatment session can then focus on PT & OT goals. Less episodes of incontinence. Diabetes medications being adjusted.  Pain meds managing pain issues. Working toward goals of min/mod level. Husband to come in three days prior to discharge for training.  Revisions to Treatment Plan:  None   Continued Need for Acute Rehabilitation Level of Care: The patient requires daily medical management by a physician with specialized training in physical medicine and rehabilitation for the following conditions: Daily direction of a multidisciplinary physical rehabilitation program to ensure safe treatment while eliciting the highest outcome that is of practical value to the patient.: Yes Daily medical management of patient stability for increased activity during participation in an intensive rehabilitation regime.: Yes Daily analysis of laboratory values and/or radiology reports with any subsequent need for medication adjustment of medical intervention for : Cardiac problems;Neurological problems;Diabetes problems;Other  Elease Hashimoto 05/28/2016, 8:38 AM

## 2016-05-27 NOTE — Progress Notes (Signed)
Speech Language Pathology Daily Session Note  Patient Details  Name: Margaret [name] MRN: [id number] Date of Birth: February 16, 1956  Today's Date: 05/27/2016 SLP Individual Time: 2297-9892 SLP Individual Time Calculation (min): 30 min  Short Term Goals: Week 3: SLP Short Term Goal 1 (Week 3): Patient will utilize external aids to assist with recall of new, daily information at mod I level. SLP Short Term Goal 2 (Week 3): Pt will complete daily complex problem solving tasks at mod I.  SLP Short Term Goal 3 (Week 3): Pt to complete medication management task with supervision level assist.  Skilled Therapeutic Interventions: Skilled treatment session focused on addressing cognition goals. SLP facilitated session by providing handout and discussion regarding effective memory compensatory strategies. Patient independently identified 3 and provided examples of how she was currently implementing them.  SLP provided a couple other methods and provided Supervision level verbal cues to anticipate how to implement them at home after discharge. SLP and patient reviewed goals and discussed that since speech and swallow goals are met and patient is close to achieving cognition goals as well that treatment plan will be modified to a frequency of 3 times a week; patient in agreement.      Function:  Cognition Comprehension Comprehension assist level: Understands complex 90% of the time/cues 10% of the time  Expression   Expression assist level: Expresses complex 90% of the time/cues < 10% of the time  Social Interaction Social Interaction assist level: Interacts appropriately 90% of the time - Needs monitoring or encouragement for participation or interaction.  Problem Solving Problem solving assist level: Solves complex 90% of the time/cues < 10% of the time  Memory Memory assist level: Recognizes or recalls 90% of the time/requires cueing < 10% of the time    Pain Pain Assessment Pain Assessment:  No/denies pain  Therapy/Group: Individual Therapy  Carmelia Roller., [place] 05/27/2016, 4:32 PM

## 2016-05-27 NOTE — Progress Notes (Signed)
Occupational Therapy Session Note  Patient Details  Name: Margaret [name] MRN: [id number] Date of Birth: 1955-12-18  Today's Date: 05/27/2016 OT Individual Time: 0904-1001 OT Individual Time Calculation (min): 57 min    Skilled Therapeutic Interventions/Progress Updates:    Pt worked on Brewing technologist for trunk and the LUE during session.  Pt transferred to the mat from the wheelchair with max assist stand pivot.  Worked on maintaining upright sitting posture and avoiding lean to the right while engaged in simple shoulder flexion movements on tilted stool using the LUE.  Pt able to activate LUE movement without compensation in the trunk and while maintaining midline posture.  Pt with no pain in the shoulder to start session but did report pain with wrist extension.  Attempted re-alignment of wrist prior to activity but it did not change pain.  Adjusted wrist position as needed during activity to avoid pain.  Pt with good movement of the RUE on stool but did develop increased pain after completion of multiple reps.  Pain not present with active movement however.  Pt was only able to obtain relief with support at the elbow and with the arm positioned across her body.  Finished session with transfer back to the wheelchair. Therapist changed out arm trough for 1/2 lap tray to increase positioning comfort.  Pt left in wheelchair at beside with call button in reach and phone in reach.  No pain at end of session.   Therapy Documentation Precautions:  Precautions Precautions: Fall, Sternal Precaution Comments: L hemiparesis; L neglect; L subluxed shoulder Required Braces or Orthoses: Other Brace/Splint (left shoulder taped) Restrictions Weight Bearing Restrictions: No LLE Weight Bearing: Weight bearing as tolerated Other Position/Activity Restrictions: sternal precautions  Pain: Pain Assessment Pain Assessment: Faces Faces Pain Scale: Hurts little more Pain Type: Acute pain Pain  Location: Shoulder Pain Orientation: Left Pain Descriptors / Indicators: Discomfort;Aching Pain Onset: With Activity Pain Intervention(s): Repositioned;Emotional support ADL: See Function Navigator for Current Functional Status.   Therapy/Group: Individual Therapy  Leyanna Bittman OTR/L 05/27/2016, 12:28 PM

## 2016-05-27 NOTE — Progress Notes (Signed)
Social Work Patient ID: Margaret [name], female   DOB: Oct 09, 1956, 60 y.o.   MRN: [id number] Met with pt to discuss team conference progression toward her goals and plan for family education next week with husband. She is pleased with her progress and is working hard. Discussed equipment needs and will go ahead and order the wheelchair to make sure right and fit prior to leaving next Thursday. Husband to come in Mon-Wed for education. Will see and discuss Issues and continue to work on discharge plans.

## 2016-05-27 NOTE — Progress Notes (Signed)
ANTICOAGULATION CONSULT NOTE - Follow Up Consult  Pharmacy Consult for coumadin Indication: MVR and afib  Allergies  Allergen Reactions  . Codeine Hives, Itching and Other (See Comments)    "breathing problems"    Patient Measurements: Weight: 236 lb (107.049 kg) Heparin Dosing Weight:   Vital Signs: Temp: 98.7 F (37.1 C) (07/05 0543) Temp Source: Oral (07/05 0543) BP: 136/65 mmHg (07/05 0543) Pulse Rate: 63 (07/05 0543)  Labs:  Recent Labs  05/25/16 0412 05/26/16 0526 05/27/16 0458  HGB 11.7*  --   --   HCT 39.9  --   --   PLT 543*  --   --   LABPROT 32.2* 28.4* 26.6*  INR 3.21* 2.72* 2.49*  CREATININE 0.96  --   --     Estimated Creatinine Clearance: 75.8 mL/min (by C-G formula based on Cr of 0.96).   Medications:  Scheduled:  . amiodarone  200 mg Oral Daily  . aspirin EC  81 mg Oral Daily  . carvedilol  3.125 mg Oral BID WC  . clonazePAM  0.5 mg Oral QHS  . escitalopram  10 mg Oral QHS  . famotidine  20 mg Oral BID  . feeding supplement (GLUCERNA SHAKE)  237 mL Oral BID BM  . furosemide  40 mg Oral BID  . insulin aspart  0-20 Units Subcutaneous TID WC  . insulin detemir  30 Units Subcutaneous Daily  . nystatin   Topical BID  . senna-docusate  2 tablet Oral BID  . spironolactone  50 mg Oral Daily  . traZODone  100 mg Oral QHS  . Warfarin - Pharmacist Dosing Inpatient   Does not apply q1800   Infusions:    Assessment: 60 yo female with MVR and afib is currently on slightly subtherapeutic coumadin.  INR is down to 2.49 from 2.72 Goal of Therapy:  INR 2.5-3.5 Monitor platelets by anticoagulation protocol: Yes   Plan:  Coumadin 5 mg po x1 (may need Coumadin 5mg  PO daily except 2.5mg  on Tuesday and Sat) Daily INR Monitor CBC, s/sx bleeding  Adana Marik, [name] 05/27/2016,8:22 AM

## 2016-05-27 NOTE — Plan of Care (Signed)
Problem: RH Bed to Chair Transfers Goal: LTG Patient will perform bed/chair transfers w/assist (PT) LTG: Patient will perform bed/chair transfers with assistance, with/without cues (PT).  With LRAD; upgrade 2/2 progress  Problem: RH Wheelchair Mobility Goal: LTG Patient will propel w/c in controlled environment (PT) LTG: Patient will propel wheelchair in controlled environment, # of feet with assist (PT)  Outcome: Not Applicable Date Met:  03/70/96 D/c 2/2 sternal precautions Goal: LTG Patient will propel w/c in home environment (PT) LTG: Patient will propel wheelchair in home environment, # of feet with assistance (PT).  Outcome: Not Applicable Date Met:  43/83/81 D/c 2/2 sternal precautions  Problem: RH Stairs Goal: LTG Patient will ambulate up and down stairs w/assist (PT) LTG: Patient will ambulate up and down # of stairs with assistance (PT)  8 steps (3 inches) with 1 rail; upgrade 2/2 progress

## 2016-05-27 NOTE — Progress Notes (Signed)
[facility] PHYSICAL MEDICINE & REHABILITATION     PROGRESS NOTE  Subjective/Complaints:  Pt laying in bed.  She notes occasional "pinch" in her stomach upon standing.    ROS: Denies CP, SOB, nausea, vomiting, diarrhea.  Objective: Vital Signs: Blood pressure 130/70, pulse 68, temperature 98.7 F (37.1 C), temperature source Oral, resp. rate 18, weight 107.049 kg (236 lb), SpO2 94 %. No results found.  Recent Labs  05/25/16 0412  WBC 10.5  HGB 11.7*  HCT 39.9  PLT 543*    Recent Labs  05/25/16 0412  NA 140  K 4.2  CL 93*  GLUCOSE 143*  BUN 15  CREATININE 0.96  CALCIUM 12.0*   CBG (last 3)   Recent Labs  05/26/16 2104 05/27/16 0655 05/27/16 0717  GLUCAP 174* 146* 140*    Wt Readings from Last 3 Encounters:  05/27/16 107.049 kg (236 lb)  05/12/16 105.008 kg (231 lb 8 oz)  04/14/16 117.935 kg (260 lb)    Physical Exam:  BP 130/70 mmHg  Pulse 68  Temp(Src) 98.7 F (37.1 C) (Oral)  Resp 18  Wt 107.049 kg (236 lb)  SpO2 94% Constitutional: She appears well-developed. Obese  HENT: Normocephalic and atraumatic.  Eyes: EOM are normal. Right eye exhibits no discharge. Left eye exhibits no discharge.  Cardiovascular: Regular Rate and rhythm. Respiratory: No respiratory distress. Effort normal.  GI: Soft. Bowel sounds are normal. Non-tender.  No guarding. Musculoskeletal: She exhibits edema. No tenderness with PROM left shoulder. 1/2" sublux  Neurological: She is alert. A cranial nerve deficit is present.  Left facial weakness  Dysarthria.  Memory fair. RUE/RLE: 5/5 proximal to distal LUE 0/5 proximal to distal.  LLE: 2/5 proximal to distal. mAS 1/4 Left elbow extension Skin: Chest incision clean dry. Vascular changes bilateral lower extremities  Psychiatric: Her affect is blunt and flat (improving). Her speech is delayed. She is slowed.   Assessment/Plan: 1. 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 Physiatrist is providing close team supervision and 24 hour management of active medical problems listed below. Physiatrist and rehab team continue to assess barriers to discharge/monitor patient progress toward functional and medical goals.  Function:  Bathing Bathing position   Position: Wheelchair/chair at sink  Bathing parts Body parts bathed by patient: Left arm, Chest, Abdomen, Front perineal area, Buttocks, Right upper leg, Left upper leg, Right lower leg, Left lower leg Body parts bathed by helper: Right arm, Back  Bathing assist Assist Level: Touching or steadying assistance(Pt > 75%)      Upper Body Dressing/Undressing Upper body dressing   What is the patient wearing?: Pull over shirt/dress Bra - Perfomed by patient: Thread/unthread right bra strap, Thread/unthread left bra strap, Hook/unhook bra (pull down sports bra)   Pull over shirt/dress - Perfomed by patient: Thread/unthread right sleeve, Put head through opening, Pull shirt over trunk Pull over shirt/dress - Perfomed by helper: Thread/unthread left sleeve        Upper body assist Assist Level: Touching or steadying assistance(Pt > 75%)      Lower Body Dressing/Undressing Lower body dressing   What is the patient wearing?: Pants, Underwear Underwear - Performed by patient: Thread/unthread right underwear leg Underwear - Performed by helper: Thread/unthread left underwear leg, Pull underwear up/down Pants- Performed by patient: Thread/unthread right pants leg Pants- Performed by helper:  Thread/unthread left pants leg, Pull pants up/down     Socks - Performed by patient: Don/doff right sock Socks - Performed by helper: Don/doff left sock Shoes - Performed by patient: Don/doff right shoe, Fasten right, Fasten left Shoes - Performed by  helper: Don/doff right shoe, Don/doff left shoe   AFO - Performed by helper: Don/doff left AFO      Lower body assist Assist for lower body dressing: Touching or steadying assistance (Pt > 75%)      Toileting Toileting Toileting activity did not occur: No continent bowel/bladder event Toileting steps completed by patient: Performs perineal hygiene, Adjust clothing after toileting Toileting steps completed by helper: Adjust clothing prior to toileting, Adjust clothing after toileting, Performs perineal hygiene Toileting Assistive Devices: Grab bar or rail  Toileting assist Assist level: Two helpers   Transfers Chair/bed transfer   Chair/bed transfer method: Stand pivot Chair/bed transfer assist level: Maximal assist (Pt 25 - 49%/lift and lower) Chair/bed transfer assistive device: Mechanical lift Mechanical lift: Stedy   Locomotion Ambulation     Max distance: 15 ft Assist level: Maximal assist (Pt 25 - 49%)   Wheelchair   Type: Manual Max wheelchair distance: 75 Assist Level: Dependent (Pt equals 0%)  Cognition Comprehension Comprehension assist level: Understands complex 90% of the time/cues 10% of the time  Expression Expression assist level: Expresses complex 90% of the time/cues < 10% of the time  Social Interaction Social Interaction assist level: Interacts appropriately 90% of the time - Needs monitoring or encouragement for participation or interaction.  Problem Solving Problem solving assist level: Solves complex 90% of the time/cues < 10% of the time  Memory Memory assist level: Recognizes or recalls 90% of the time/requires cueing < 10% of the time    Medical Problem List and Plan: 1. 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 Sternal precautions after mitral valve repair  Continue CIR  PRAFO, hand splints   Extensor tone in LUE,  however, this appears to be functional at present, will consider treatment if necessary 2. DVT Prophylaxis/Anticoagulation: Coumadin therapy after mitral valve repair  INR Subtherapeutic today. Pharmacy protocol  3. Pain Management: Oxycodone as needed  -muscle rub for left shoulder pain,mild sublux  -pt using sling when up with therapy  -keep left arm elevated while in bed/chair 4. Mood/panic attacks: Lexapro 10 mg daily at bedtime, trazodone 100 mg daily at bedtime, Klonopin 0.5 mg daily at bedtime and 3 times a day as needed 5. Neuropsych: This patient is not fully capable of making decisions on her own behalf. 6. Skin/Wound Care: Routine skin checks 7. Fluids/Electrolytes/Nutrition: Routine I&O  8. Dysphagia. Dysphagia #3 thin liquids. Tolerating 9. PAF with RVR. Continue amiodarone, Aldactone 50 mg daily, Tenormin 12.5 mg twice a day. Appreciate Cards recs, signed off.  10. Acute on chronic diastolic congestive heart failure.   Lasix 80 mg daily, increased to 60 BID on 6/22 .  daily weights  Monitor for any signs of fluid overload, weights stable  Filed Weights   05/25/16 0643 05/26/16 0601 05/27/16 0543  Weight: 105.5 kg (232 lb 9.4 oz) 105.688 kg (233 lb) 107.049 kg (236 lb)  11. Diabetes mellitus with peripheral neuropathy. Check blood sugars before meals and at bedtime.   Levemir increased to 30u daily on 7/2, increased to 35U on 7/6  Continue to monitor 12. Leukocytosis: Trending down  Currently afebrile  WBCs 10.5 on 7/3  UA  negative, U culture with multiple species  Chest x-ray from 6/21 showing interstitial edema and? Infiltrate/atelectasis (appears to have slightly improved from previous x-ray) 13. AKI: Resolved  Creatinine 0.96 on 7/3   Encourage fluid intake 14. Constipation: Improved 15. ABLA  Hb 11.7 on 7/3  Cont to monitor  LOS (Days) 15 A FACE TO FACE EVALUATION WAS PERFORMED  Ankit Lorie Phenix 05/27/2016 9:25 AM

## 2016-05-28 ENCOUNTER — Encounter (HOSPITAL_COMMUNITY): Payer: Self-pay

## 2016-05-28 ENCOUNTER — Inpatient Hospital Stay (HOSPITAL_COMMUNITY): Payer: Self-pay | Admitting: Speech Pathology

## 2016-05-28 ENCOUNTER — Inpatient Hospital Stay (HOSPITAL_COMMUNITY): Payer: Self-pay | Admitting: Occupational Therapy

## 2016-05-28 ENCOUNTER — Inpatient Hospital Stay (HOSPITAL_COMMUNITY): Payer: Self-pay | Admitting: Physical Therapy

## 2016-05-28 ENCOUNTER — Ambulatory Visit: Payer: Self-pay | Admitting: Physician Assistant

## 2016-05-28 DIAGNOSIS — Z5181 Encounter for therapeutic drug level monitoring: Secondary | ICD-10-CM | POA: Insufficient documentation

## 2016-05-28 DIAGNOSIS — Z7901 Long term (current) use of anticoagulants: Secondary | ICD-10-CM

## 2016-05-28 LAB — GLUCOSE, CAPILLARY
GLUCOSE-CAPILLARY: 129 mg/dL — AB (ref 65–99)
GLUCOSE-CAPILLARY: 163 mg/dL — AB (ref 65–99)
GLUCOSE-CAPILLARY: 143 mg/dL — AB (ref 65–99)
Glucose-Capillary: 140 mg/dL — ABNORMAL HIGH (ref 65–99)

## 2016-05-28 LAB — PROTIME-INR
INR: 2.48 — AB (ref 0.00–1.49)
PROTHROMBIN TIME: 26.6 s — AB (ref 11.6–15.2)

## 2016-05-28 MED ORDER — WARFARIN SODIUM 5 MG PO TABS
5.0000 mg | ORAL_TABLET | Freq: Once | ORAL | Status: AC
  Administered 2016-05-28: 5 mg via ORAL
  Filled 2016-05-28: qty 1

## 2016-05-28 NOTE — Progress Notes (Signed)
[facility] PHYSICAL MEDICINE & REHABILITATION     PROGRESS NOTE  Subjective/Complaints:  Pt sleepy this AM.  She states she need a little more rest.    ROS: Denies CP, SOB, nausea, vomiting, diarrhea.  Objective: Vital Signs: Blood pressure 144/56, pulse 62, temperature 98.5 F (36.9 C), temperature source Oral, resp. rate 18, weight 106.459 kg (234 lb 11.2 oz), SpO2 94 %. No results found. No results for input(s): WBC, HGB, HCT, PLT in the last 72 hours. No results for input(s): NA, K, CL, GLUCOSE, BUN, CREATININE, CALCIUM in the last 72 hours.  Invalid input(s): CO CBG (last 3)   Recent Labs  05/27/16 1642 05/27/16 2108 05/28/16 0648  GLUCAP 105* 145* 129*    Wt Readings from Last 3 Encounters:  05/28/16 106.459 kg (234 lb 11.2 oz)  05/12/16 105.008 kg (231 lb 8 oz)  04/14/16 117.935 kg (260 lb)    Physical Exam:  BP 144/56 mmHg  Pulse 62  Temp(Src) 98.5 F (36.9 C) (Oral)  Resp 18  Wt 106.459 kg (234 lb 11.2 oz)  SpO2 94% Constitutional: She appears well-developed. Obese  HENT: Normocephalic and atraumatic.  Eyes: EOM are normal. Right eye exhibits no discharge. Left eye exhibits no discharge.  Cardiovascular: Regular Rate and rhythm. Respiratory: No respiratory distress. Effort normal.  GI: Soft. Bowel sounds are normal. Non-tender.  No guarding. Musculoskeletal: She exhibits edema. No tenderness with PROM left shoulder. 1/2" sublux  Neurological: She is alert. A cranial nerve deficit is present.  Left facial weakness  Dysarthria.  Memory fair. RUE/RLE: 5/5 proximal to distal LUE 0/5 proximal to distal.  LLE: 2/5 proximal to distal. mAS 1/4 Left elbow extension Skin: Chest incision clean dry. Vascular changes bilateral lower extremities  Psychiatric: Her affect is blunt and flat (improving). Her speech is delayed. She is slowed.   Assessment/Plan: 1. 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 Physiatrist is providing close team supervision and 24 hour management of active medical problems listed below. Physiatrist and rehab team continue to assess barriers to discharge/monitor patient progress toward functional and medical goals.  Function:  Bathing Bathing position   Position: Wheelchair/chair at sink  Bathing parts Body parts bathed by patient: Left arm, Chest, Abdomen, Front perineal area, Buttocks, Right upper leg, Left upper leg, Right lower leg, Left lower leg Body parts bathed by helper: Right arm, Back  Bathing assist Assist Level: Touching or steadying assistance(Pt > 75%)      Upper Body Dressing/Undressing Upper body dressing   What is the patient wearing?: Pull over shirt/dress Bra - Perfomed by patient: Thread/unthread right bra strap, Thread/unthread left bra strap, Hook/unhook bra (pull down sports bra)   Pull over shirt/dress - Perfomed by patient: Thread/unthread right sleeve, Put head through opening, Pull shirt over trunk Pull over shirt/dress - Perfomed by helper: Thread/unthread left sleeve        Upper body assist Assist Level: Touching or steadying assistance(Pt > 75%)      Lower Body Dressing/Undressing Lower body dressing   What is the patient wearing?: Pants, Underwear Underwear - Performed by patient: Thread/unthread right underwear leg Underwear - Performed by helper: Thread/unthread left underwear leg, Pull underwear up/down Pants- Performed by patient: Thread/unthread right pants leg Pants- Performed by helper: Thread/unthread left pants leg, Pull pants up/down  Socks - Performed by patient: Don/doff right sock Socks - Performed by helper: Don/doff left sock Shoes - Performed by patient: Don/doff right shoe, Fasten right, Fasten left Shoes - Performed by  helper: Don/doff right shoe, Don/doff left shoe   AFO - Performed by helper: Don/doff left AFO      Lower body assist Assist for lower body dressing: Touching or steadying assistance (Pt > 75%)      Toileting Toileting Toileting activity did not occur: No continent bowel/bladder event Toileting steps completed by patient: Performs perineal hygiene, Adjust clothing after toileting Toileting steps completed by helper: Adjust clothing prior to toileting, Adjust clothing after toileting, Performs perineal hygiene Toileting Assistive Devices: Grab bar or rail  Toileting assist Assist level: Two helpers   Transfers Chair/bed transfer   Chair/bed transfer method: Stand pivot Chair/bed transfer assist level: Maximal assist (Pt 25 - 49%/lift and lower) Chair/bed transfer assistive device: Mechanical lift Mechanical lift: Stedy   Locomotion Ambulation     Max distance: 15 ft Assist level: Maximal assist (Pt 25 - 49%)   Wheelchair   Type: Manual Max wheelchair distance: 75 Assist Level: Dependent (Pt equals 0%)  Cognition Comprehension Comprehension assist level: Understands basic 90% of the time/cues < 10% of the time  Expression Expression assist level: Expresses basic 90% of the time/requires cueing < 10% of the time.  Social Interaction Social Interaction assist level: Interacts appropriately 90% of the time - Needs monitoring or encouragement for participation or interaction.  Problem Solving Problem solving assist level: Solves basic 90% of the time/requires cueing < 10% of the time  Memory Memory assist level: Recognizes or recalls 90% of the time/requires cueing < 10% of the time    Medical Problem List and Plan: 1. 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 Sternal precautions after mitral valve repair  Continue CIR  PRAFO, hand splints    Extensor tone in LUE, however, this appears to be functional at present, will consider treatment if necessary 2. DVT Prophylaxis/Anticoagulation: Coumadin therapy after mitral valve repair  INR Subtherapeutic today. Pharmacy protocol  3. Pain Management: Oxycodone as needed  -muscle rub for left shoulder pain,mild sublux  -pt using sling when up with therapy  -keep left arm elevated while in bed/chair 4. Mood/panic attacks: Lexapro 10 mg daily at bedtime, trazodone 100 mg daily at bedtime, Klonopin 0.5 mg daily at bedtime and 3 times a day as needed 5. Neuropsych: This patient is not fully capable of making decisions on her own behalf. 6. Skin/Wound Care: Routine skin checks 7. Fluids/Electrolytes/Nutrition: Routine I&O  8. Dysphagia. Dysphagia #3 thin liquids. Tolerating 9. PAF with RVR. Continue amiodarone, Aldactone 50 mg daily, Tenormin 12.5 mg twice a day. Appreciate Cards recs, signed off.  10. Acute on chronic diastolic congestive heart failure.   Lasix 80 mg daily, increased to 60 BID on 6/22 .  daily weights  Monitor for any signs of fluid overload, weights stable  Filed Weights   05/26/16 0601 05/27/16 0543 05/28/16 0509  Weight: 105.688 kg (233 lb) 107.049 kg (236 lb) 106.459 kg (234 lb 11.2 oz)  11. Diabetes mellitus with peripheral neuropathy. Check blood sugars before meals and at bedtime.   Levemir increased to 30u daily on 7/2, increased to 35U on 7/6  Continue to monitor 12. Leukocytosis: Trending down  Currently afebrile  WBCs 10.5 on 7/3  UA negative, U culture with multiple species  Chest  x-ray from 6/21 showing interstitial edema and? Infiltrate/atelectasis (appears to have slightly improved from previous x-ray) 13. AKI: Resolved  Creatinine 0.96 on 7/3   Encourage fluid intake 14. Constipation: Improved 15. ABLA  Hb 11.7 on 7/3  Cont to monitor 16. HTN  Cont meds  Slightly labile  LOS (Days) 16 A FACE TO FACE EVALUATION WAS  PERFORMED  Margaret [name] Lorie Phenix 05/28/2016 8:09 AM

## 2016-05-28 NOTE — Progress Notes (Signed)
Occupational Therapy daily and  Weekly Progress Note  Patient Details  Name: Margaret [name] MRN: [id number] Date of Birth: Nov 20, 1956   1:1 self care retraining at shower level. Heavy focus today on stand pivot transfers and short distance functional ambulation with hemi walker. Stand pivot from w/c to toilet with mod A and then short distance ambulation to [facility] in shower and then to w/c to exit shower. Pt verbalizes  aloud sequence of movement to help with coordination mod I with extra time to process. Pt able to utilize hemi techniques for bathing and dressing mod I with extra time. Pt able to perform sit to stand from [facility] and from w/c with supervision today with extra time to achieve and maintain forward weight shift.   Beginning of progress report period: May 20, 2016 End of progress report period: May 28, 2016  Today's Date: 05/28/2016 OT Individual Time: 1100-1205 OT Individual Time Calculation (min): 65 min    Patient has met 4 of 4 short term goals.  Pt continues to progress with her function mobility during ADL tasks. Pt currently is off O2. Pt can perform a sit to stand with supervision (from elevated surface) and a stand pivot without AD with min to mod A and is currently learning to incorporate the hemi walker with transfers. Pt is scheduled for d/c 7/13 and continues to be on target for meeting her goals.  At this time she is only working on functional ambulation in a controlled environment with mod A with A to advance her left LE. Pt at this time plans to remain in common living area with a hospital bed and BSC. Pt has been showering here on a BSC but will not be able to access her tub shower at home at this time. (a tub bench has been order for the future as she continues to progress with continued therapy services). Pt can performing toileting mod to max A due to body habitus and mod A for LB bathing and dressing.   Patient continues to demonstrate the following deficits: left  side hemiplegia, decr activity tolerance, coordination of use of an AD, dynamic standing balance , sternal precautions etc  and therefore will continue to benefit from skilled OT intervention to enhance overall performance with BADL and Reduce care partner burden.  Patient progressing toward long term goals..  Continue plan of care.  OT Short Term Goals Week 2:  OT Short Term Goal 1 (Week 2): Pt will be educated on tub/shower transfers using tub transfer bench  MET OT Short Term Goal 2 (Week 2): Pt will be min assist with stand pivot transfer w/c to/from [facility] MET OT Short Term Goal 3 (Week 2): Pt will be educated on a LUE HEP  MET OT Short Term Goal 4 (Week 2): Pt will perform sit to/from stands with min assist as a precursor for ADLs  MET Week 3:  OT Short Term Goal 1 (Week 3): LTG=STG OT Short Term Goal 2 (Week 3): Complete Family education with husband  Skilled Therapeutic Interventions/Progress Updates:      Therapy Documentation Precautions:  Precautions Precautions: Fall, Sternal Precaution Comments: L hemiparesis; L neglect; L subluxed shoulder Required Braces or Orthoses: Other Brace/Splint (left shoulder taped) Restrictions Weight Bearing Restrictions: Yes (sternal precatuions) LLE Weight Bearing: Weight bearing as tolerated Other Position/Activity Restrictions: sternal precautions General:   Vital Signs: Therapy Vitals Temp: 97.8 F (36.6 C) Temp Source: Oral Pulse Rate: 61 Resp: 18 BP: (!) 142/66 mmHg Patient Position (if  appropriate): Sitting Oxygen Therapy SpO2: 95 % O2 Device: Not Delivered Pain: Pain Assessment Pain Assessment: No/denies pain Pain Score: 0-No pain ADL:   Exercises:   Other Treatments:    See Function Navigator for Current Functional Status.   Therapy/Group: Individual Therapy  [facility] [address] 05/28/2016, 3:06 PM

## 2016-05-28 NOTE — Progress Notes (Signed)
Physical Therapy Session Note  Patient Details  Name: Margaret [name] MRN: [id number] Date of Birth: 11-06-1956  Today's Date: 05/28/2016 PT Individual Time: 1301-1401 PT Individual Time Calculation (min): 60 min   Short Term Goals: Week 1:  PT Short Term Goal 1 (Week 1): Patient will perform bed mobility consistent ly with mod A  PT Short Term Goal 1 - Progress (Week 1): Not met PT Short Term Goal 2 (Week 1): Patient will propell WC 151f in controlled environment with min A.  PT Short Term Goal 2 - Progress (Week 1): Met PT Short Term Goal 3 (Week 1): Patient will ambulate 112fwith LRAD and max A from PT.  PT Short Term Goal 3 - Progress (Week 1): Not met PT Short Term Goal 4 (Week 1): Patient will perform stedy transfers with mod A.  PT Short Term Goal 4 - Progress (Week 1): Not met  Skilled Therapeutic Interventions/Progress Updates:    [facility]   PT instructed patient in Transfer training with hemi walker to R and L x 6 each direction with Mod A to L and Min A to R. At end of session patient required mod A for stand pivot in both directions secondary to fatigue. PT provided multi modal cues for improved use of Hemi walker with proper positioning, increased step length with BLE to allow improved speed of transfer, sequencing of movement and proper weight shift to allow foot clearance for stepping. Patient demonstrated difficulty advancing LLE with turns to L, but was able to perform hip extension to improve LLE positioning to turn to R.  PT instructed patient in Gait training for 1566f 2 with max +2 A for WC follow on rail in hall. PT assisted patient with LLE knee extension with constant tactile cues for quad activation as well as for improve stabilization of the L knee in stance to prevent genu recurvatum.   Sit<>supine transfer with min A for sit>supine and mod A for supine>sitwith mod cues for improved use of BLE and R UE to increase success  of transfer. Supine NMR. D1 and D2 flexion extension with the LLE with AAROM progressing to manual resistance from PT x 10 in each plane. PT provided mod cues for improved hip and knee activation to improve force of movement and prepare to dynamic movement in stance.   Patient returned to room and left sitting in [facility] and call bell in place.     Therapy Documentation Precautions:  Precautions Precautions: Fall, Sternal Precaution Comments: L hemiparesis; L neglect; L subluxed shoulder Required Braces or Orthoses: Other Brace/Splint (left shoulder taped) Restrictions Weight Bearing Restrictions: Yes (sternal precatuions) LLE Weight Bearing: Weight bearing as tolerated Other Position/Activity Restrictions: sternal precautions General:   Vital Signs: Therapy Vitals Temp: 97.8 F (36.6 C) Temp Source: Oral Pulse Rate: 61 Resp: 18 BP: (!) 142/66 mmHg Patient Position (if appropriate): Sitting Oxygen Therapy SpO2: 95 % O2 Device: Not Delivered  See Function Navigator for Current Functional Status.   Therapy/Group: Individual Therapy  AusLorie Phenix6/2017, 6:43 PM

## 2016-05-28 NOTE — Progress Notes (Signed)
Speech Language Pathology Daily Session Note  Patient Details  Name: Margaret [name] MRN: [id number] Date of Birth: [birth year]  Today's Date: 05/28/2016 SLP Individual Time: 0904-1000 SLP Individual Time Calculation (min): 56 min  Short Term Goals: Week 3: SLP Short Term Goal 1 (Week 3): Patient will utilize external aids to assist with recall of new, daily information at mod I level. SLP Short Term Goal 2 (Week 3): Pt will complete daily complex problem solving tasks at mod I.  SLP Short Term Goal 3 (Week 3): Pt to complete medication management task with supervision level assist.  Skilled Therapeutic Interventions:  Pt was seen for skilled ST targeting cognitive goals.  Therapist facilitated the session with a medication management task targeting high level problem solving and recall of complex information.  Pt recalled ~80% of currently scheduled medications with mod I.  Pt then organized pills of varying dosages and frequencies into a BID pill box with mod I for 100% accuracy.  Pt was able to recognize errors with mod I but required supervision verbal cues to problem solve correction of errors.  Pt was left in wheelchair at the end of today's therapy session with call bell within reach. Continue per current plan of care.    Function:  Eating Eating                 Cognition Comprehension Comprehension assist level: Follows complex conversation/direction with extra time/assistive device  Expression   Expression assist level: Expresses complex ideas: With extra time/assistive device  Social Interaction Social Interaction assist level: Interacts appropriately with others with medication or extra time (anti-anxiety, antidepressant).  Problem Solving Problem solving assist level: Solves basic 90% of the time/requires cueing < 10% of the time  Memory Memory assist level: Recognizes or recalls 90% of the time/requires cueing < 10% of the time    Pain Pain Assessment Pain  Assessment: No/denies pain  Therapy/Group: Individual Therapy  Toria Monte, Selinda Orion 05/28/2016, 12:14 PM

## 2016-05-28 NOTE — Progress Notes (Signed)
Occupational Therapy Session Note  Patient Details  Name: Margaret [name] MRN: [id number] Date of Birth: 1956/11/06  Today's Date: 05/28/2016 OT Individual Time: 1500-1530 OT Individual Time Calculation (min): 30 min   Short Term Goals: Week 1:  OT Short Term Goal 1 (Week 1): Pt will be mod I with bed mobility with HOB down as a precursor for ADLs  OT Short Term Goal 1 - Progress (Week 1): Progressing toward goal OT Short Term Goal 2 (Week 1): Pt will complete UB bathing and dressing seated EOB unsupported with moderate assistance  OT Short Term Goal 2 - Progress (Week 1): Met OT Short Term Goal 3 (Week 1): Pt will perform toilet transfer with mod assist  OT Short Term Goal 3 - Progress (Week 1): Met OT Short Term Goal 4 (Week 1): Pt will independently verbalize and adhere to sternal precautions 100% of the time OT Short Term Goal 4 - Progress (Week 1): Met   Week 2:  OT Short Term Goal 1 (Week 2): Pt will be educated on tub/shower transfers using tub transfer bench OT Short Term Goal 2 (Week 2): Pt will be min assist with stand pivot transfer w/c to/from [facility] OT Short Term Goal 3 (Week 2): Pt will be educated on a LUE HEP  OT Short Term Goal 4 (Week 2): Pt will perform sit to/from stands with min assist as a precursor for ADLs    Week 3:  OT Short Term Goal 1 (Week 3): LTG=STG OT Short Term Goal 2 (Week 3): Complete Family education with husband  Skilled Therapeutic Interventions/Progress Updates:  Patient found seated in w/c with husband present in room, husband left as soon as therapist arrived. Therapist propelled pt from room to family room to go over LUE HEP. During supination exercises, pt with increased pain in left wrist and during shoulder flexion exercises pt with pain in left shoulder; monitored this during session. Administered handout for LUE HEP:  Shoulder exercises: 1) AAROM shoulder shrugs X10 2) AAROM shoulder flexion (chin tuck) X10 3) AAROM shoulder flexion  (reach for the sky) X10 4) AAROM abduction X10  5) AAROM horizontal abduction/adduction X10   Elbow exercises: 1) AAROM elbow flexion/extension X10 2)AAROM supination/pronation X10  Encouraged pt to do each exercise 10 times each and 1-2 times per day.   Friend, Buddy present during exercises and providing appropriate cueing. Buddy assisted pt back to room.   Therapy Documentation Precautions:  Precautions Precautions: Fall, Sternal Precaution Comments: L hemiparesis; L neglect; L subluxed shoulder Required Braces or Orthoses: Other Brace/Splint (left shoulder taped) Restrictions Weight Bearing Restrictions: Yes (sternal precatuions) LLE Weight Bearing: Weight bearing as tolerated Other Position/Activity Restrictions: sternal precautions  Vital Signs: Therapy Vitals Temp: 97.8 F (36.6 C) Temp Source: Oral Pulse Rate: 61 Resp: 18 BP: (!) 142/66 mmHg Patient Position (if appropriate): Sitting Oxygen Therapy SpO2: 95 % O2 Device: Not Delivered  See Function Navigator for Current Functional Status.  Therapy/Group: Individual Therapy  Chrys Racer , MS, OTR/L, CLT  05/28/2016, 3:33 PM

## 2016-05-28 NOTE — Progress Notes (Signed)
ANTICOAGULATION CONSULT NOTE - Follow Up Consult  Pharmacy Consult for coumadin Indication: MV repair / afib   Allergies  Allergen Reactions  . Codeine Hives, Itching and Other (See Comments)    "breathing problems"    Patient Measurements: Weight: 234 lb 11.2 oz (106.459 kg) Heparin Dosing Weight:   Vital Signs: Temp: 98.5 F (36.9 C) (07/06 0509) Temp Source: Oral (07/06 0509) BP: 144/56 mmHg (07/06 0509) Pulse Rate: 62 (07/06 0509)  Labs:  Recent Labs  05/26/16 0526 05/27/16 0458 05/28/16 0542  LABPROT 28.4* 26.6* 26.6*  INR 2.72* 2.49* 2.48*    Estimated Creatinine Clearance: 75.6 mL/min (by C-G formula based on Cr of 0.96).   Medications:  Scheduled:  . amiodarone  200 mg Oral Daily  . aspirin EC  81 mg Oral Daily  . carvedilol  3.125 mg Oral BID WC  . clonazePAM  0.5 mg Oral QHS  . escitalopram  10 mg Oral QHS  . famotidine  20 mg Oral BID  . feeding supplement (GLUCERNA SHAKE)  237 mL Oral BID BM  . furosemide  40 mg Oral BID  . insulin aspart  0-20 Units Subcutaneous TID WC  . insulin detemir  35 Units Subcutaneous Daily  . nystatin   Topical BID  . senna-docusate  2 tablet Oral BID  . spironolactone  50 mg Oral Daily  . traZODone  100 mg Oral QHS  . Warfarin - Pharmacist Dosing Inpatient   Does not apply q1800   Infusions:    Assessment: 60 yo female with MV repair and afib is currently on slightly subtherapeutic coumadin.  INR today is 2.48.  Goal of Therapy:  INR 2.5-3.5 Monitor platelets by anticoagulation protocol: Yes   Plan:  Coumadin 5 mg po x1 (may need Coumadin 5mg  PO daily except 2.5mg  on Tuesday and Sat) Daily INR Monitor CBC, s/sx bleeding  Camron Monday, [name] 05/28/2016,8:19 AM

## 2016-05-29 ENCOUNTER — Inpatient Hospital Stay (HOSPITAL_COMMUNITY): Payer: Self-pay | Admitting: Physical Therapy

## 2016-05-29 ENCOUNTER — Inpatient Hospital Stay (HOSPITAL_COMMUNITY): Payer: Medicaid Other | Admitting: Occupational Therapy

## 2016-05-29 DIAGNOSIS — F4322 Adjustment disorder with anxiety: Secondary | ICD-10-CM | POA: Insufficient documentation

## 2016-05-29 LAB — GLUCOSE, CAPILLARY
GLUCOSE-CAPILLARY: 148 mg/dL — AB (ref 65–99)
GLUCOSE-CAPILLARY: 143 mg/dL — AB (ref 65–99)
Glucose-Capillary: 128 mg/dL — ABNORMAL HIGH (ref 65–99)
Glucose-Capillary: 177 mg/dL — ABNORMAL HIGH (ref 65–99)

## 2016-05-29 LAB — PROTIME-INR
INR: 2.45 — ABNORMAL HIGH (ref 0.00–1.49)
PROTHROMBIN TIME: 26.3 s — AB (ref 11.6–15.2)

## 2016-05-29 MED ORDER — WARFARIN SODIUM 5 MG PO TABS
5.0000 mg | ORAL_TABLET | Freq: Once | ORAL | Status: AC
  Administered 2016-05-29: 5 mg via ORAL
  Filled 2016-05-29: qty 1

## 2016-05-29 MED ORDER — GLUCERNA SHAKE PO LIQD
237.0000 mL | Freq: Every day | ORAL | Status: DC
  Administered 2016-05-30 – 2016-06-03 (×5): 237 mL via ORAL

## 2016-05-29 NOTE — Consult Note (Signed)
PSYCHODIAGNOSTIC EVALUATION - [facility]   [name] is a 60 year old woman, who was seen for a psychodiagnostic evaluation to assess her emotional functioning in the setting of debility following CVA with lengthy hospitalization.    During today's session, [name] initially reported that things are "going well," though she later explained that she has felt frustrated by her loss of independence.  She also acknowledged a brief period of suicidal ideation immediately following her stroke when she wanted to "give up," but her best friend was able to talk to her and she denied suicidal thoughts since that time.  She stated that when she feels down, she watches the food network or reads to reset her mood.  She denied any prior history of depression or anxiety.  While she denied symptoms of a pervasive anxiety disorder, she reported feeling "a little nervous" about discharge because she is not sure if her husband has effectively managed everything in her absence.  Specifically, she noted worry over whether or not her ramp will be built.  She also reported worry about how to get her dentures to stay in now that she has lost weight and whether or not her disability will come through to help with financial burdens.  She commented that she has experienced more anxiety since her stroke than ever before and that she is taking medication to help as well as using deep breathing techniques.  Despite initially increased anxiety after her stroke, she said that she has not felt highly anxious in a while and may discuss with her physician whether continued treatment with anti-anxiety medication seems medically indicated.  Overall, she said that she has significant social support and she is looking forward to going home and seeing her dog.  From a cognitive perspective, [name] said that she has noticed slowed processing speed, but denied noticing other  cognitive disruption.    IMPRESSION:  [name] endorsed symptoms consistent with an adjustment disorder with anxiety.  It seems as though her anxiety is well-controlled at this time.  At some point in the near future, perhaps after discharge, she may no longer require treatment with anti-anxiety medication, though her physician will make the final determination regarding medication management.  Risk for worsening mood disruption after discharge was discussed with [name] and she appeared to understand.  From a cognitive perspective, [name] reported noticing slowed processing speed, which is common following stroke.  She was provided with psychoeducation regarding expectations for cognitive recovery following stroke.  She was also advised to seek a comprehensive neuropsychological evaluation post-discharge should she notice continued cognitive concerns that do not resolve with time.  She was agreeable.  Her social worker should be sure to include contact information for providers in her area in her discharge paperwork.  Her social worker may also have insight into helping her husband figure out plans to build a ramp at their home and who to contact about fitting her dentures.  Follow-up with the neuropsychologist could be requested, should the treatment team feel that it would be beneficial.    DIAGNOSIS:   Adjustment disorder with anxiety  Margaret [name], Psy.D.  Clinical Neuropsychologist

## 2016-05-29 NOTE — Progress Notes (Signed)
Nutrition Follow-up  DOCUMENTATION CODES:   Obesity unspecified  INTERVENTION:  Provide Glucerna Shake po once daily, each supplement provides 220 kcal and 10 grams of protein.  Diabetic diet handout given.   Encourage adequate PO intake.   NUTRITION DIAGNOSIS:   Increased nutrient needs related to chronic illness as evidenced by estimated needs; ongoing  GOAL:   Patient will meet greater than or equal to 90% of their needs; met  MONITOR:   PO intake, Supplement acceptance, Weight trends, Labs, I & O's  REASON FOR ASSESSMENT:   Malnutrition Screening Tool    ASSESSMENT:   60 y.o. 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 Presented 04/23/2016 plan mitral valve surgery and underwent mitral valve repair 04/30/2016 Presents with debility after mitral valve repair, h/o r CVA.  Pt was unavailable during time of visit. Meal completion has been 100%. Pt currently has Glucerna Shake ordered and has been consuming them. RD to modify orders as intake has been adequate. Diet handout "Carbohydrate Counting for People with Diabetes" was put on pt bedside table. RD contact information given.   Labs and medications reviewed.   Diet Order:  Diet Carb Modified Fluid consistency:: Thin; Room service appropriate?: Yes  Skin:  Reviewed, no issues  Last BM:  7/6  Height:   Ht Readings from Last 1 Encounters:  04/30/16 5' 5" (1.651 m)    Weight:   Wt Readings from Last 1 Encounters:  05/29/16 239 lb 14.4 oz (108.818 kg)    Ideal Body Weight:  56.8 kg  BMI:  Body mass index is 39.92  kg/(m^2).  Estimated Nutritional Needs:   Kcal:  1800-1950  Protein:  95-105 grams  Fluid:  Per MD  EDUCATION NEEDS:   No education needs identified at this time   , MS, RD, LDN Pager # [phone] After hours/ weekend pager # [phone]  

## 2016-05-29 NOTE — Progress Notes (Signed)
Occupational Therapy Session Note  Patient Details  Name: Margaret [name] MRN: [id number] Date of Birth: 10/14/1956  Today's Date: 05/29/2016 OT Individual Time: 5277-8242 OT Individual Time Calculation (min): 75 min    Short Term Goals: Week 1:  OT Short Term Goal 1 (Week 1): Pt will be mod I with bed mobility with HOB down as a precursor for ADLs  OT Short Term Goal 1 - Progress (Week 1): Progressing toward goal OT Short Term Goal 2 (Week 1): Pt will complete UB bathing and dressing seated EOB unsupported with moderate assistance  OT Short Term Goal 2 - Progress (Week 1): Met OT Short Term Goal 3 (Week 1): Pt will perform toilet transfer with mod assist  OT Short Term Goal 3 - Progress (Week 1): Met OT Short Term Goal 4 (Week 1): Pt will independently verbalize and adhere to sternal precautions 100% of the time OT Short Term Goal 4 - Progress (Week 1): Met Week 2:  OT Short Term Goal 1 (Week 2): Pt will be educated on tub/shower transfers using tub transfer bench OT Short Term Goal 2 (Week 2): Pt will be min assist with stand pivot transfer w/c to/from [facility] OT Short Term Goal 3 (Week 2): Pt will be educated on a LUE HEP  OT Short Term Goal 4 (Week 2): Pt will perform sit to/from stands with min assist as a precursor for ADLs  Week 3:  OT Short Term Goal 1 (Week 3): LTG=STG OT Short Term Goal 2 (Week 3): Complete Family education with husband  Skilled Therapeutic Interventions/Progress Updates:    Pt seen for skilled OT to facilitate dynamic balance needed for dressing and toileting skills. Pt stated she preferred not to bathe as she had a shower yesterday. She had edema in B feet, spoke with nurse about use of TED hose. Placed knee high TEDs on pt and she really liked them stating she felt relief.  Pt worked on Land and pants from w/c.  Placed L foot in with 50% assist and was able to pull pants over hips in standing with only 20% assist.  Pt did very well with sit to stand  skills. Repeated activity 6x and each time she only needed slight touching A. One time in standing she leaned back slightly and had a loss of balance. Spouse arrived and pt taken to gym to demonstrate to spouse sit to stand skills and safe positioning to assist her as needed. Attempted a stand pivot transfer without AFO (due to edema) and pt had LOB due to knee buckling. She was assisted into a sitting position in w/c. This was a good opportunity to demonstrate to husband how to guard her in case of a LOB.  Used UE ranger for very small ROM of shoulder. Pt did have pain if hand elevated above elbow height. Focused on scapular retraction. Spouse took pt to room after therapy.   Therapy Documentation Precautions:  Precautions Precautions: Fall, Sternal Precaution Comments: L hemiparesis; L neglect; L subluxed shoulder Required Braces or Orthoses: Other Brace/Splint (left shoulder taped) Restrictions Weight Bearing Restrictions: Yes (sternal precatuions) LLE Weight Bearing: Weight bearing as tolerated Other Position/Activity Restrictions: sternal precautions   Pain: Pain Assessment Pain Assessment: No/denies pain   ADL:  See Function Navigator for Current Functional Status.   Therapy/Group: Individual Therapy  [place] 05/29/2016, 12:17 PM

## 2016-05-29 NOTE — Progress Notes (Signed)
Physical Therapy Note  Patient Details  Name: Margaret [name] MRN: [id number] Date of Birth: 1956/04/07 Today's Date: 05/29/2016    Time: 830-900 30 minutes  1:1 No c/o pain.  Treatment session focused on stand pivot transfer training with hemi walker and Lt AFO.  Pt initially mod A for sit to stand and SPT to Lt, improved to supervision for sit to stand and min A for SPT both directions by end of session. Pt requires min/mod A occasionally to advance Lt LE when fatigued, good awareness of safety and sequencing throughout.   Priyansh Pry 05/29/2016, 8:59 AM

## 2016-05-29 NOTE — Progress Notes (Signed)
Social Work Patient ID: Margaret [name], female   DOB: 1956-11-18, 60 y.o.   MRN: [id number] Met with pt and husband to discuss discharge and goals and ramp situation. He feels he has some help with this but may not be built until one week after discharge. Discussed going home via ambulance and then Having ramp completed can go to MD's appointments. He plans to come in next week for three days to learn pt's care. Encouraged him to have friend come in also to go through therapies, since she will be assisting also. Will work on discharge plans.

## 2016-05-29 NOTE — Progress Notes (Signed)
Physical Therapy Session Note  Patient Details  Name: Margaret [name] MRN: [id number] Date of Birth: 1956-08-04  Today's Date: 05/29/2016 PT Individual Time: 1301-1411 PT Individual Time Calculation (min): 70 min   Short Term Goals: Week 2:  PT Short Term Goal 1 (Week 2): Pt will perform bed mobility with consistent mod A (currently mod A) PT Short Term Goal 1 - Progress (Week 2): Progressing toward goal PT Short Term Goal 2 (Week 2): Pt will perform bed <> chair transfers with mod A and maintain sternal precautions (max A +1) PT Short Term Goal 2 - Progress (Week 2): Progressing toward goal PT Short Term Goal 3 (Week 2): Pt will tolerate standing x 5-8 minutes at time for standing balance and gait training activities with mod A PT Short Term Goal 3 - Progress (Week 2): Met PT Short Term Goal 4 (Week 2): Pt will perform ambulation with appropriate AD x 15' with max A (max A +  w/c follow (+2 for safety)) PT Short Term Goal 4 - Progress (Week 2): Progressing toward goal Week 3:  PT Short Term Goal 1 (Week 3): Pt will perform bed mobility consistently with mod A. PT Short Term Goal 2 (Week 3): Pt will perform bed<>chair transfers with Mod A & LRAD. PT Short Term Goal 3 (Week 3): Pt will ambulate 52f with appropriate AD & Mod A.    Skilled Therapeutic Interventions/Progress Updates:    Patient received sitting in WC and agreeable to PT. Patient transported to rehab gym with total A for time management.  Orthotist present for orthotic evaluation. Education provided for application of brace and improved use of brace to prevent knee snap back as well as use of heel wedge in shoe to improve angle of orthotic to increase ankle and knee stabillity.  Sit<>stand x 8 throughout treatment with supervision- minA due to fatigue,. Weight shifting performed in standing with HW and L AFO; one LOB requiring guidance to chair with weight shifting. Patient noted to be able to bear weight evenly only with L  knee in hyperextension or utilizing brace for support in stance.   Gait training for 162fwith HW and WC follow for safety. PT provided mod-max A for LLE limb advancement and improve control of Knee to prevent excessive genu recurvatum.  Following gait training Orthotist reccomends [facility] 3/8" heel wedge and report that patient will receive new AFO within 4 days.   [facility].  Knee flexion/extension on towel in sitting position x 10 with min A x 4 to improve quality of movement. BLE hip abduction/adduciton against manual resistance x 12 BLE Patient required rest break following each exercise due to increased fatigue.   Transfer training x 3 with stand pivot to R with hemi walker. Min A and mod cues for sequencing of movements from PT for successful transfer. Patient continues to demonstrate knee hyperextension for stepping R foot forward to prepare for transfer.   Patient returned to room at end of session with call bell within reach.     Therapy Documentation Precautions:  Precautions Precautions: Fall, Sternal Precaution Comments: L hemiparesis; L neglect; L subluxed shoulder Required Braces or Orthoses: Other Brace/Splint (left shoulder taped) Restrictions Weight Bearing Restrictions: Yes (sternal precatuions) LLE Weight Bearing: Weight bearing as tolerated Other Position/Activity Restrictions: sternal precautions Pain: Pain Assessment Pain Assessment: No/denies pain  See Function Navigator for Current Functional Status.   Therapy/Group: Individual Therapy  AuLorie Phenix  05/29/2016, 2:18 PM

## 2016-05-29 NOTE — Progress Notes (Signed)
ANTICOAGULATION CONSULT NOTE - Follow Up Consult  Pharmacy Consult for couamdin Indication: MVR and afib  Allergies  Allergen Reactions  . Codeine Hives, Itching and Other (See Comments)    "breathing problems"    Patient Measurements: Weight: 239 lb 14.4 oz (108.818 kg) Heparin Dosing Weight:   Vital Signs: Temp: 98.3 F (36.8 C) (07/07 0516) Temp Source: Oral (07/07 0516) BP: 122/78 mmHg (07/07 0516) Pulse Rate: 61 (07/07 0516)  Labs:  Recent Labs  05/27/16 0458 05/28/16 0542 05/29/16 0547  LABPROT 26.6* 26.6* 26.3*  INR 2.49* 2.48* 2.45*    Estimated Creatinine Clearance: 76.4 mL/min (by C-G formula based on Cr of 0.96).   Medications:  Scheduled:  . amiodarone  200 mg Oral Daily  . aspirin EC  81 mg Oral Daily  . carvedilol  3.125 mg Oral BID WC  . clonazePAM  0.5 mg Oral QHS  . escitalopram  10 mg Oral QHS  . famotidine  20 mg Oral BID  . feeding supplement (GLUCERNA SHAKE)  237 mL Oral BID BM  . furosemide  40 mg Oral BID  . insulin aspart  0-20 Units Subcutaneous TID WC  . insulin detemir  35 Units Subcutaneous Daily  . nystatin   Topical BID  . senna-docusate  2 tablet Oral BID  . spironolactone  50 mg Oral Daily  . traZODone  100 mg Oral QHS  . Warfarin - Pharmacist Dosing Inpatient   Does not apply q1800   Infusions:    Assessment: 60 yo female with hx of MVR and afib is currently on slightly subtherapeutic coumadin.  INR today is 2.45  Goal of Therapy:  INR 2.5-3.5 Monitor platelets by anticoagulation protocol: Yes   Plan:  Coumadin 5 mg po x1 (if INR continues to be <2.5 tom, will need higher dose).  Was previously good on 5 mg most days and 2.5 mg few days a week Daily INR Monitor CBC, s/sx bleeding  May Ozment, [name] 05/29/2016,8:21 AM

## 2016-05-29 NOTE — Progress Notes (Signed)
[place] PHYSICAL MEDICINE & REHABILITATION     PROGRESS NOTE  Subjective/Complaints:  Pt seen about to go to the restroom. She states she is doing better. The pinch in her stomach is only occasional.     ROS: Denies CP, SOB, nausea, vomiting, diarrhea.  Objective: Vital Signs: Blood pressure 122/78, pulse 61, temperature 98.3 F (36.8 C), temperature source Oral, resp. rate 18, weight 108.818 kg (239 lb 14.4 oz), SpO2 96 %. No results found. No results for input(s): WBC, HGB, HCT, PLT in the last 72 hours. No results for input(s): NA, K, CL, GLUCOSE, BUN, CREATININE, CALCIUM in the last 72 hours.  Invalid input(s): CO CBG (last 3)   Recent Labs  05/28/16 1643 05/28/16 2127 05/29/16 0705  GLUCAP 143* 140* 148*    Wt Readings from Last 3 Encounters:  05/29/16 108.818 kg (239 lb 14.4 oz)  05/12/16 105.008 kg (231 lb 8 oz)  04/14/16 117.935 kg (260 lb)    Physical Exam:  BP 122/78 mmHg  Pulse 61  Temp(Src) 98.3 F (36.8 C) (Oral)  Resp 18  Wt 108.818 kg (239 lb 14.4 oz)  SpO2 96% Constitutional: She appears well-developed. Obese  HENT: Normocephalic and atraumatic.  Eyes: EOM are normal. Right eye exhibits no discharge. Left eye exhibits no discharge.  Cardiovascular: Regular Rate and rhythm. Respiratory: No respiratory distress. Effort normal.  GI: Soft. Bowel sounds are normal. Non-tender.  No guarding. Musculoskeletal: She exhibits edema. No tenderness.  Neurological: She is alert. A cranial nerve deficit is present.  Left facial weakness  Dysarthria.  Memory fair. RUE/RLE: 5/5 proximal to distal LUE 0/5 proximal to distal.  LLE: 2/5 proximal to distal. mAS 1/4 Left elbow extension Skin: Chest incision clean dry. Vascular changes bilateral lower extremities  Psychiatric: Her affect is blunt and flat (improving). Her speech is delayed. She is slowed.   Assessment/Plan: 1. 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 Physiatrist is providing close team supervision and 24 hour management of active medical problems listed below. Physiatrist and rehab team continue to assess barriers to discharge/monitor patient progress toward functional and medical goals.  Function:  Bathing Bathing position   Position: Shower  Bathing parts Body parts bathed by patient: Left arm, Chest, Abdomen, Front perineal area, Buttocks, Right upper leg, Left upper leg, Right lower leg, Left lower leg, Right arm Body parts bathed by helper: Back  Bathing assist Assist Level: Touching or steadying assistance(Pt > 75%)      Upper Body Dressing/Undressing Upper body dressing   What is the patient wearing?: Pull over shirt/dress Bra - Perfomed by patient: Thread/unthread right bra strap, Thread/unthread left bra strap, Hook/unhook bra (pull down sports bra)   Pull over shirt/dress - Perfomed by patient: Thread/unthread right sleeve, Put head through opening, Pull shirt over trunk, Thread/unthread left sleeve Pull over shirt/dress - Perfomed by helper: Thread/unthread left sleeve        Upper body assist Assist Level: Set up, More than reasonable time   Set up : To obtain clothing/put away  Lower Body Dressing/Undressing Lower body dressing   What is the patient wearing?: Pants, Socks, Shoes, AFO Underwear - Performed by patient: Thread/unthread right underwear leg Underwear - Performed by helper: Thread/unthread left underwear leg, Pull underwear up/down Pants- Performed by patient: Thread/unthread right pants leg Pants- Performed by  helper: Thread/unthread left pants leg, Pull pants up/down     Socks - Performed by patient: Don/doff right sock Socks - Performed by helper: Don/doff left sock Shoes - Performed by patient: Don/doff  right shoe, Fasten right, Fasten left Shoes - Performed by helper: Don/doff left shoe   AFO - Performed by helper: Don/doff left AFO      Lower body assist Assist for lower body dressing: Touching or steadying assistance (Pt > 75%)      Toileting Toileting Toileting activity did not occur: No continent bowel/bladder event Toileting steps completed by patient: Performs perineal hygiene, Adjust clothing after toileting Toileting steps completed by helper: Adjust clothing prior to toileting, Performs perineal hygiene, Adjust clothing after toileting Toileting Assistive Devices: Grab bar or rail  Toileting assist Assist level: Two helpers   Transfers Chair/bed transfer   Chair/bed transfer method: Stand pivot Chair/bed transfer assist level: Touching or steadying assistance (Pt > 75%) Chair/bed transfer assistive device: Armrests, Walker (hemiwalker) Mechanical lift: Ecologist     Max distance: 52ft Assist level: 2 helpers   Wheelchair   Type: Manual Max wheelchair distance: 75 Assist Level: Dependent (Pt equals 0%)  Cognition Comprehension Comprehension assist level: Follows complex conversation/direction with extra time/assistive device  Expression Expression assist level: Expresses complex ideas: With extra time/assistive device  Social Interaction Social Interaction assist level: Interacts appropriately with others with medication or extra time (anti-anxiety, antidepressant).  Problem Solving Problem solving assist level: Solves basic 90% of the time/requires cueing < 10% of the time  Memory Memory assist level: Recognizes or recalls 90% of the time/requires cueing < 10% of the time    Medical Problem List and Plan: 1. 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 Sternal precautions after mitral valve repair  Continue  CIR  PRAFO, hand splints   Extensor tone in LUE, however, this appears to be functional at present, will consider treatment if necessary 2. DVT Prophylaxis/Anticoagulation: Coumadin therapy after mitral valve repair  INR remains Subtherapeutic today. Pharmacy protocol  3. Pain Management: Oxycodone as needed  -muscle rub for left shoulder pain,mild sublux  -pt using sling when up with therapy  -keep left arm elevated while in bed/chair 4. Mood/panic attacks: Lexapro 10 mg daily at bedtime, trazodone 100 mg daily at bedtime, Klonopin 0.5 mg daily at bedtime and 3 times a day as needed 5. Neuropsych: This patient is not fully capable of making decisions on her own behalf. 6. Skin/Wound Care: Routine skin checks 7. Fluids/Electrolytes/Nutrition: Routine I&O  8. Dysphagia. Dysphagia #3 thin liquids. Tolerating 9. PAF with RVR. Continue amiodarone, Aldactone 50 mg daily, Tenormin 12.5 mg twice a day. Appreciate Cards recs, signed off.  10. Acute on chronic diastolic congestive heart failure.   Lasix 80 mg daily, increased to 60 BID on 6/22 .  daily weights  Monitor for any signs of fluid overload, weights stable  Filed Weights   05/27/16 0543 05/28/16 0509 05/29/16 0500  Weight: 107.049 kg (236 lb) 106.459 kg (234 lb 11.2 oz) 108.818 kg (239 lb 14.4 oz)  11. Diabetes mellitus with peripheral neuropathy. Check blood sugars before meals and at bedtime.   Levemir increased to 30u daily on 7/2, increased to 35U on 7/6  Improving, will likely increase again tomorrow  Continue to monitor 12. Leukocytosis: Trending down  Currently afebrile  WBCs 10.5 on 7/3  UA negative, U culture with multiple species  Chest x-ray from 6/21 showing interstitial edema and? Infiltrate/atelectasis (appears to have slightly improved from previous x-ray) 13. AKI: Resolved  Creatinine 0.96 on 7/3   Encourage fluid intake 14. Constipation: Improved 15. ABLA  Hb 11.7 on 7/3  Cont to monitor 16.  HTN  Cont meds  LOS (Days) 17 A FACE TO FACE EVALUATION WAS PERFORMED  Lessa Huge Lorie Phenix 05/29/2016 11:49 AM

## 2016-05-29 NOTE — Progress Notes (Signed)
Physical Therapy Session Note  Patient Details  Name: Margaret [name] MRN: [id number] Date of Birth: 1956-03-19  Today's Date: 05/29/2016 PT Individual Time: 1100-1200 PT Individual Time Calculation (min): 60 min   Short Term Goals: Week 3:  PT Short Term Goal 1 (Week 3): Pt will perform bed mobility consistently with mod A. PT Short Term Goal 2 (Week 3): Pt will perform bed<>chair transfers with Mod A & LRAD. PT Short Term Goal 3 (Week 3): Pt will ambulate 85ft with appropriate AD & Mod A.  Skilled Therapeutic Interventions/Progress Updates:   Pt received in w/c with husband present.   OT recommending Kinesiotaping of L shoulder for positioning and pain relief due to subluxation.  Pt performed stand pivot transfer w/c > mat with LUE supported in sling, L AFO and R hemi walker with mod-max A with therapist on L side providing trunk control, positioning of LLE and verbal cues for sequencing pivot.  Seated on mat performed Kinesiotaping of L shoulder with facilitation of deltoid, shoulder ER, scap retraction and for relaxation of pectoralis on L.  Continued training for pivoting/changes in direction sequencing with hemi walker with pt performing R and L turns around cones x 15' with mod-max A of therapist on L side tactile cues for upright trunk control, advancement and positioning of LLE, facilitation of forward weight shift over LLE in stance and to prevent L genu recurvatum and verbal cues for sequencing.   Returned to room in w/c total A and pt positioned with all items within reach.    Therapy Documentation Precautions:  Precautions Precautions: Fall, Sternal Precaution Comments: L hemiparesis; L neglect; L subluxed shoulder Required Braces or Orthoses: Other Brace/Splint (left shoulder taped) Restrictions Weight Bearing Restrictions: Yes (sternal precatuions) LLE Weight Bearing: Weight bearing as tolerated Other Position/Activity Restrictions: sternal precautions Pain: Pain  Assessment Pain Assessment: No/denies pain Other Treatments: Treatments Neuromuscular Facilitation: Left;Upper Extremity;Activity to increase motor control;Activity to increase sustained activation   See Function Navigator for Current Functional Status.   Therapy/Group: Individual Therapy  Raylene Everts [facility] 05/29/2016, 12:08 PM

## 2016-05-30 ENCOUNTER — Inpatient Hospital Stay (HOSPITAL_COMMUNITY): Payer: Medicaid Other | Admitting: Occupational Therapy

## 2016-05-30 DIAGNOSIS — F4322 Adjustment disorder with anxiety: Secondary | ICD-10-CM

## 2016-05-30 LAB — PROTIME-INR
INR: 2.54 — AB (ref 0.00–1.49)
Prothrombin Time: 27 seconds — ABNORMAL HIGH (ref 11.6–15.2)

## 2016-05-30 LAB — GLUCOSE, CAPILLARY
GLUCOSE-CAPILLARY: 122 mg/dL — AB (ref 65–99)
Glucose-Capillary: 134 mg/dL — ABNORMAL HIGH (ref 65–99)
Glucose-Capillary: 206 mg/dL — ABNORMAL HIGH (ref 65–99)
Glucose-Capillary: 141 mg/dL — ABNORMAL HIGH (ref 65–99)

## 2016-05-30 MED ORDER — INSULIN DETEMIR 100 UNIT/ML ~~LOC~~ SOLN
40.0000 [IU] | Freq: Every day | SUBCUTANEOUS | Status: DC
  Administered 2016-05-31 – 2016-06-02 (×3): 40 [IU] via SUBCUTANEOUS
  Filled 2016-05-30 (×3): qty 0.4

## 2016-05-30 MED ORDER — WARFARIN SODIUM 5 MG PO TABS
5.0000 mg | ORAL_TABLET | Freq: Once | ORAL | Status: AC
  Administered 2016-05-30: 5 mg via ORAL
  Filled 2016-05-30: qty 1

## 2016-05-30 NOTE — Progress Notes (Signed)
Occupational Therapy Session Note  Patient Details  Name: Margaret [name] MRN: [id number] Date of Birth: May 29, 1956  Today's Date: 05/30/2016 OT Individual Time: 1305-1405 OT Individual Time Calculation (min): 60 min    Short Term Goals: Week 1:  OT Short Term Goal 1 (Week 1): Pt will be mod I with bed mobility with HOB down as a precursor for ADLs  OT Short Term Goal 1 - Progress (Week 1): Progressing toward goal OT Short Term Goal 2 (Week 1): Pt will complete UB bathing and dressing seated EOB unsupported with moderate assistance  OT Short Term Goal 2 - Progress (Week 1): Met OT Short Term Goal 3 (Week 1): Pt will perform toilet transfer with mod assist  OT Short Term Goal 3 - Progress (Week 1): Met OT Short Term Goal 4 (Week 1): Pt will independently verbalize and adhere to sternal precautions 100% of the time OT Short Term Goal 4 - Progress (Week 1): Met Week 2:  OT Short Term Goal 1 (Week 2): Pt will be educated on tub/shower transfers using tub transfer bench OT Short Term Goal 2 (Week 2): Pt will be min assist with stand pivot transfer w/c to/from [facility] OT Short Term Goal 3 (Week 2): Pt will be educated on a LUE HEP  OT Short Term Goal 4 (Week 2): Pt will perform sit to/from stands with min assist as a precursor for ADLs  Week 3:  OT Short Term Goal 1 (Week 3): LTG=STG OT Short Term Goal 2 (Week 3): Complete Family education with husband  Skilled Therapeutic Interventions/Progress Updates:    Pt seen this session to facilitate sit to stand skills, transfers, and standing balance with ADL retraining. Pt very upset and crying about frustrations she was experiencing from earlier in the day. Discussed them and possible solutions with communication.  Offered pt a shower to relax and she was eager to do so.  To ensure safe transfer into shower, used Steady lift with min A to stand to steady.  Pt sat on BSC in shower to wash most of her body with A for L arm and feet.  Pt dried off and  donned clothing from chair. Pt able to stand during dressing with hemiwalker with steady A. For transfer back to w/c used squat pivot with steady A.  Pt adjusted in w/c with all needs met.    Therapy Documentation Precautions:  Precautions Precautions: Fall, Sternal Precaution Comments: L hemiparesis; L neglect; L subluxed shoulder Required Braces or Orthoses: Other Brace/Splint (left shoulder taped) Restrictions Weight Bearing Restrictions: Yes (sternal precatuions) LLE Weight Bearing: Weight bearing as tolerated Other Position/Activity Restrictions: sternal precautions    Vital Signs: Therapy Vitals Temp: 97.4 F (36.3 C) Temp Source: Oral Pulse Rate: 64 Resp: 16 BP: (!) 148/64 mmHg Patient Position (if appropriate): Sitting Oxygen Therapy SpO2: 94 % O2 Device: Not Delivered Pain: Pain Assessment Pain Assessment: No/denies pain   ADL:  See Function Navigator for Current Functional Status.   Therapy/Group: Individual Therapy  Perkins 05/30/2016, 3:49 PM

## 2016-05-30 NOTE — Progress Notes (Signed)
ANTICOAGULATION CONSULT NOTE - Follow Up Consult  Pharmacy Consult for couamdin Indication: MVR and afib  Allergies  Allergen Reactions  . Codeine Hives, Itching and Other (See Comments)    "breathing problems"    Patient Measurements: Weight: 235 lb 0.2 oz (106.6 kg)  Vital Signs: Temp: 97.5 F (36.4 C) (07/08 0431) Temp Source: Oral (07/08 0431) BP: 149/53 mmHg (07/08 0904) Pulse Rate: 67 (07/08 0904)  Labs:  Recent Labs  05/28/16 0542 05/29/16 0547 05/30/16 0609  LABPROT 26.6* 26.3* 27.0*  INR 2.48* 2.45* 2.54*    Estimated Creatinine Clearance: 75.6 mL/min (by C-G formula based on Cr of 0.96).   Medications:  Scheduled:  . amiodarone  200 mg Oral Daily  . aspirin EC  81 mg Oral Daily  . carvedilol  3.125 mg Oral BID WC  . clonazePAM  0.5 mg Oral QHS  . escitalopram  10 mg Oral QHS  . famotidine  20 mg Oral BID  . feeding supplement (GLUCERNA SHAKE)  237 mL Oral Q1500  . furosemide  40 mg Oral BID  . insulin aspart  0-20 Units Subcutaneous TID WC  . [START ON 05/31/2016] insulin detemir  40 Units Subcutaneous Daily  . nystatin   Topical BID  . senna-docusate  2 tablet Oral BID  . spironolactone  50 mg Oral Daily  . traZODone  100 mg Oral QHS  . warfarin  5 mg Oral ONCE-1800  . Warfarin - Pharmacist Dosing Inpatient   Does not apply q1800   Assessment: 60 yo female with hx of MVR and afib. Currently therapeutic on coumadin with INR today at 2.54. CBC stable on 7/3. No s/s bleeding.    Goal of Therapy:  INR 2.5-3.5 Monitor platelets by anticoagulation protocol: Yes   Plan:  Coumadin 5 mg po x1 (if INR continues to be borderline 2.5, may need higher dose).   Was previously therapeutic on 5 mg most days and 2.5 mg a few days a week Daily INR Monitor CBC, s/sx bleeding  Honor Loh 05/30/2016,2:09 PM  Pager: [phone]

## 2016-05-30 NOTE — Progress Notes (Addendum)
[place] PHYSICAL MEDICINE & REHABILITATION     PROGRESS NOTE  Subjective/Complaints:  Pt seen laying in bed.  She is sleepy this AM.  She wants to rest more.   ROS: Denies CP, SOB, nausea, vomiting, diarrhea.  Objective: Vital Signs: Blood pressure 136/52, pulse 60, temperature 97.5 F (36.4 C), temperature source Oral, resp. rate 17, weight 106.6 kg (235 lb 0.2 oz), SpO2 92 %. No results found. No results for input(s): WBC, HGB, HCT, PLT in the last 72 hours. No results for input(s): NA, K, CL, GLUCOSE, BUN, CREATININE, CALCIUM in the last 72 hours.  Invalid input(s): CO CBG (last 3)   Recent Labs  05/29/16 1649 05/29/16 2113 05/30/16 0652  GLUCAP 128* 177* 134*    Wt Readings from Last 3 Encounters:  05/30/16 106.6 kg (235 lb 0.2 oz)  05/12/16 105.008 kg (231 lb 8 oz)  04/14/16 117.935 kg (260 lb)    Physical Exam:  BP 136/52 mmHg  Pulse 60  Temp(Src) 97.5 F (36.4 C) (Oral)  Resp 17  Wt 106.6 kg (235 lb 0.2 oz)  SpO2 92% Constitutional: She appears well-developed. Obese.  HENT: Normocephalic and atraumatic.  Eyes: EOM are normal. Right eye exhibits no discharge. Left eye exhibits no discharge.  Cardiovascular: Regular Rate and rhythm. Respiratory: No respiratory distress. Effort normal.  GI: Soft. Bowel sounds are normal. Non-tender.  No guarding. Musculoskeletal: She exhibits edema. No tenderness. Left shoulder with kineseotaping in place.  Neurological: She is alert. A cranial nerve deficit is present.  Left facial weakness  Dysarthria.  Memory fair. RUE/RLE: 5/5 proximal to distal LUE 0/5 proximal to distal.  LLE: 2/5 proximal to distal. mAS 1/4 Left elbow extension Skin: Chest incision clean dry. Vascular changes bilateral lower extremities  Psychiatric: Her affect is blunt and flat (improving). Her speech is delayed. She is slowed.   Assessment/Plan: 1. 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 Physiatrist is providing close team supervision and 24 hour management of active medical problems listed below. Physiatrist and rehab team continue to assess barriers to discharge/monitor patient progress toward functional and medical goals.  Function:  Bathing Bathing position   Position: Shower  Bathing parts Body parts bathed by patient: Left arm, Chest, Abdomen, Front perineal area, Buttocks, Right upper leg, Left upper leg, Right lower leg, Left lower leg, Right arm Body parts bathed by helper: Back  Bathing assist Assist Level: Touching or steadying assistance(Pt > 75%)      Upper Body Dressing/Undressing Upper body dressing   What is the patient wearing?: Pull over shirt/dress Bra - Perfomed by patient: Thread/unthread right bra strap, Thread/unthread left bra strap, Hook/unhook bra (pull down sports bra)   Pull over shirt/dress - Perfomed by patient: Thread/unthread right sleeve, Put head through opening, Pull shirt over trunk, Thread/unthread left sleeve Pull over shirt/dress - Perfomed by helper: Thread/unthread left sleeve        Upper body assist Assist Level: Set up, More than reasonable time, Supervision or verbal cues   Set up : To obtain clothing/put away  Lower Body Dressing/Undressing Lower body dressing   What is the patient wearing?: Pants, Shoes, [employer]  - Performed by patient: Thread/unthread right underwear leg Underwear - Performed by helper: Thread/unthread left underwear leg, Pull underwear up/down Pants- Performed by patient: Thread/unthread right pants leg  Pants- Performed by helper: Thread/unthread left pants leg, Pull pants up/down     Socks - Performed by patient: Don/doff right sock Socks - Performed by helper: Don/doff left sock Shoes - Performed by  patient: Don/doff right shoe, Fasten right Shoes - Performed by helper: Don/doff left shoe, Fasten left   AFO - Performed by helper: Don/doff left AFO   TED Hose - Performed by helper: Don/doff right TED hose, Don/doff left TED hose  Lower body assist Assist for lower body dressing: Touching or steadying assistance (Pt > 75%)      Toileting Toileting Toileting activity did not occur: No continent bowel/bladder event Toileting steps completed by patient: Performs perineal hygiene, Adjust clothing after toileting Toileting steps completed by helper: Performs perineal hygiene Toileting Assistive Devices: Grab bar or rail  Toileting assist Assist level: Two helpers   Transfers Chair/bed transfer   Chair/bed transfer method: Stand pivot Chair/bed transfer assist level: Moderate assist (Pt 50 - 74%/lift or lower) Chair/bed transfer assistive device: Walker, Orthosis Mechanical lift: Ecologist     Max distance: 15 Assist level: Maximal assist (Pt 25 - 49%)   Wheelchair   Type: Manual Max wheelchair distance: 75 Assist Level: Dependent (Pt equals 0%)  Cognition Comprehension Comprehension assist level: Follows complex conversation/direction with extra time/assistive device  Expression Expression assist level: Expresses complex ideas: With extra time/assistive device  Social Interaction Social Interaction assist level: Interacts appropriately with others with medication or extra time (anti-anxiety, antidepressant).  Problem Solving Problem solving assist level: Solves basic 90% of the time/requires cueing < 10% of the time  Memory Memory assist level: Recognizes or recalls 90% of the time/requires cueing < 10% of the time    Medical Problem List and Plan: 1. 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 Sternal precautions after mitral  valve repair  Continue CIR  PRAFO, hand splints   Extensor tone in LUE, however, this appears to be functional at present, will consider treatment if necessary 2. DVT Prophylaxis/Anticoagulation: Coumadin therapy after mitral valve repair  INR remains therapeutic today. Pharmacy protocol  3. Pain Management: Oxycodone as needed  -muscle rub for left shoulder pain,mild sublux  -pt using sling when up with therapy  -keep left arm elevated while in bed/chair 4. Mood/panic attacks: Lexapro 10 mg daily at bedtime, trazodone 100 mg daily at bedtime, Klonopin 0.5 mg daily at bedtime and 3 times a day as needed 5. Neuropsych: This patient is ?not fully capable of making decisions on her own behalf. 6. Skin/Wound Care: Routine skin checks 7. Fluids/Electrolytes/Nutrition: Routine I&O  8. Dysphagia. Dysphagia #3 thin liquids. Tolerating 9. PAF with RVR. Continue amiodarone, Aldactone 50 mg daily, Tenormin 12.5 mg twice a day. Appreciate Cards recs, signed off.  10. Acute on chronic diastolic congestive heart failure.   Lasix 80 mg daily, increased to 60 BID on 6/22 .  daily weights  Monitor for any signs of fluid overload, weights stable  Filed Weights   05/28/16 0509 05/29/16 0500 05/30/16 0431  Weight: 106.459 kg (234 lb 11.2 oz) 108.818 kg (239 lb 14.4 oz) 106.6 kg (235 lb 0.2 oz)  11. Diabetes mellitus with peripheral neuropathy. Check blood sugars before meals and at bedtime.   Levemir increased to 30u daily on 7/2, increased to 35U on 7/6, increased to 40U on 7/8  Improving, Continue to monitor 12. Leukocytosis: Trending down  Currently afebrile  WBCs 10.5  on 7/3  UA negative, U culture with multiple species  Chest x-ray from 6/21 showing interstitial edema and? Infiltrate/atelectasis (appears to have slightly improved from previous x-ray) 13. AKI: Resolved  Creatinine 0.96 on 7/3   Encourage fluid intake 14. Constipation: Improved 15. ABLA  Hb 11.7 on 7/3  Cont to  monitor 16. HTN  Cont meds  LOS (Days) 18 A FACE TO FACE EVALUATION WAS PERFORMED  Kwamaine Cuppett Lorie Phenix 05/30/2016 8:57 AM

## 2016-05-31 ENCOUNTER — Inpatient Hospital Stay (HOSPITAL_COMMUNITY): Payer: Medicaid Other | Admitting: Occupational Therapy

## 2016-05-31 LAB — GLUCOSE, CAPILLARY
GLUCOSE-CAPILLARY: 202 mg/dL — AB (ref 65–99)
GLUCOSE-CAPILLARY: 125 mg/dL — AB (ref 65–99)
GLUCOSE-CAPILLARY: 162 mg/dL — AB (ref 65–99)
Glucose-Capillary: 132 mg/dL — ABNORMAL HIGH (ref 65–99)

## 2016-05-31 LAB — PROTIME-INR
INR: 2.59 — ABNORMAL HIGH (ref 0.00–1.49)
Prothrombin Time: 27.4 seconds — ABNORMAL HIGH (ref 11.6–15.2)

## 2016-05-31 MED ORDER — WARFARIN SODIUM 5 MG PO TABS
5.0000 mg | ORAL_TABLET | Freq: Once | ORAL | Status: AC
  Administered 2016-05-31: 5 mg via ORAL
  Filled 2016-05-31: qty 1

## 2016-05-31 NOTE — Progress Notes (Signed)
Occupational Therapy Session Note  Patient Details  Name: Margaret [name] MRN: [id number] Date of Birth: 10/13/1956  Today's Date: 05/31/2016 OT Individual Time: 1435-1530 OT Individual Time Calculation (min): 55 min    Short Term Goals: Week 3:  OT Short Term Goal 1 (Week 3): LTG=STG OT Short Term Goal 2 (Week 3): Complete Family education with husband  Skilled Therapeutic Interventions/Progress Updates: patient participation as follows:   Toilet transfer w/c to [facility] to drop arm 3:1 with Min A; toieting- max A with encouragement to try to reach to wipe void & BM; Self Range of Motion Home Exerciseprogram handout with Min cues for technique.   This clinician completed left forearm mild strethching to ease of the exercise which facilitated left hand supination.  Patient left with pillows and blankets in her chair as requested and with call bell in place     Therapy Documentation Precautions:  Precautions Precautions: Fall, Sternal Precaution Comments: L hemiparesis; L neglect; L subluxed shoulder Required Braces or Orthoses: Other Brace/Splint (left shoulder taped) Restrictions Weight Bearing Restrictions: Yes (sternal precatuions) LLE Weight Bearing: Weight bearing as tolerated Other Position/Activity Restrictions: sternal precautions  Pain:denied    See Function Navigator for Current Functional Status.   Therapy/Group: Individual Therapy  Alfredia Ferguson [facility] 05/31/2016, 4:51 PM

## 2016-05-31 NOTE — Progress Notes (Signed)
ANTICOAGULATION CONSULT NOTE - Follow Up Consult  Pharmacy Consult for couamdin Indication: MVR and afib  Allergies  Allergen Reactions  . Codeine Hives, Itching and Other (See Comments)    "breathing problems"    Patient Measurements: Weight: 235 lb 14.3 oz (107 kg)  Vital Signs: Temp: 97.4 F (36.3 C) (07/09 0435) Temp Source: Oral (07/09 0435) BP: 142/54 mmHg (07/09 0756) Pulse Rate: 63 (07/09 0756)  Labs:  Recent Labs  05/29/16 0547 05/30/16 0609 05/31/16 0632  LABPROT 26.3* 27.0* 27.4*  INR 2.45* 2.54* 2.59*    Estimated Creatinine Clearance: 75.8 mL/min (by C-G formula based on Cr of 0.96).   Medications:  Scheduled:  . amiodarone  200 mg Oral Daily  . aspirin EC  81 mg Oral Daily  . carvedilol  3.125 mg Oral BID WC  . clonazePAM  0.5 mg Oral QHS  . escitalopram  10 mg Oral QHS  . famotidine  20 mg Oral BID  . feeding supplement (GLUCERNA SHAKE)  237 mL Oral Q1500  . furosemide  40 mg Oral BID  . insulin aspart  0-20 Units Subcutaneous TID WC  . insulin detemir  40 Units Subcutaneous Daily  . nystatin   Topical BID  . senna-docusate  2 tablet Oral BID  . spironolactone  50 mg Oral Daily  . traZODone  100 mg Oral QHS  . Warfarin - Pharmacist Dosing Inpatient   Does not apply q1800   Assessment: 60 yo female with hx of MVR and afib. Currently therapeutic on coumadin with INR today at 2.59. CBC stable on 7/3. No s/s bleeding.    Goal of Therapy:  INR 2.5-3.5 Monitor platelets by anticoagulation protocol: Yes   Plan:  Coumadin 5 mg po x1  Daily INR Monitor CBC, s/sx bleeding  Honor Loh 05/31/2016,3:22 PM  Pager: [phone]

## 2016-05-31 NOTE — Progress Notes (Signed)
[place] PHYSICAL MEDICINE & REHABILITATION     PROGRESS NOTE  Subjective/Complaints:  Pt laying in bed.  She states she enjoyed being able to go to sleep late last night.   ROS: Denies CP, SOB, nausea, vomiting, diarrhea.  Objective: Vital Signs: Blood pressure 142/54, pulse 63, temperature 97.4 F (36.3 C), temperature source Oral, resp. rate 16, weight 107 kg (235 lb 14.3 oz), SpO2 94 %. No results found. No results for input(s): WBC, HGB, HCT, PLT in the last 72 hours. No results for input(s): NA, K, CL, GLUCOSE, BUN, CREATININE, CALCIUM in the last 72 hours.  Invalid input(s): CO CBG (last 3)   Recent Labs  05/30/16 1626 05/30/16 2106 05/31/16 0642  GLUCAP 122* 141* 132*    Wt Readings from Last 3 Encounters:  05/31/16 107 kg (235 lb 14.3 oz)  05/12/16 105.008 kg (231 lb 8 oz)  04/14/16 117.935 kg (260 lb)    Physical Exam:  BP 142/54 mmHg  Pulse 63  Temp(Src) 97.4 F (36.3 C) (Oral)  Resp 16  Wt 107 kg (235 lb 14.3 oz)  SpO2 94% Constitutional: She appears well-developed. Obese.  HENT: Normocephalic and atraumatic.  Eyes: EOM are normal. Right eye exhibits no discharge. Left eye exhibits no discharge.  Cardiovascular: Regular Rate and rhythm. Respiratory: No respiratory distress. Effort normal.  GI: Soft. Bowel sounds are normal. Non-tender.  No guarding. Musculoskeletal: She exhibits edema. No tenderness. Left shoulder with kineseotaping in place.  Neurological: She is alert. A cranial nerve deficit is present.  Left facial weakness  Dysarthria.  Memory fair. RUE/RLE: 5/5 proximal to distal LUE 0/5 proximal to distal.  LLE: 2/5 proximal to distal. mAS 1/4 Left elbow extension Skin: Chest incision clean dry, healing. Vascular changes bilateral lower extremities  Psychiatric: She is slowed at times. Normal behavior.  Assessment/Plan: 1. 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 Physiatrist is providing close team supervision and 24 hour management of active medical problems listed below. Physiatrist and rehab team continue to assess barriers to discharge/monitor patient progress toward functional and medical goals.  Function:  Bathing Bathing position   Position: Shower  Bathing parts Body parts bathed by patient: Chest, Abdomen, Front perineal area, Buttocks, Right upper leg, Left upper leg, Right arm Body parts bathed by helper: Right lower leg, Left lower leg, Back  Bathing assist Assist Level: Touching or steadying assistance(Pt > 75%)      Upper Body Dressing/Undressing Upper body dressing   What is the patient wearing?: Pull over shirt/dress Bra - Perfomed by patient: Thread/unthread right bra strap, Thread/unthread left bra strap, Hook/unhook bra (pull down sports bra)   Pull over shirt/dress - Perfomed by patient: Thread/unthread right sleeve, Put head through opening, Pull shirt over trunk, Thread/unthread left sleeve Pull over shirt/dress - Perfomed by helper: Thread/unthread left sleeve        Upper body assist Assist Level: Set up, More than reasonable time, Supervision or verbal cues   Set up : To obtain clothing/put away  Lower Body Dressing/Undressing Lower body dressing   What is the patient wearing?: Pants, Ted Hose, Non-skid slipper socks Underwear - Performed by patient: Thread/unthread right underwear leg Underwear - Performed by helper: Thread/unthread left underwear leg, Pull underwear up/down Pants- Performed by patient: Thread/unthread right pants leg Pants- Performed by helper: Thread/unthread left  pants leg, Pull pants up/down, Thread/unthread right pants leg   Non-skid slipper socks- Performed by helper: Don/doff right sock, Don/doff left sock Socks - Performed by  patient: Don/doff right sock Socks - Performed by helper: Don/doff left sock Shoes - Performed by patient: Don/doff right shoe, Fasten right Shoes - Performed by helper: Don/doff left shoe, Fasten left   AFO - Performed by helper: Don/doff left AFO   TED Hose - Performed by helper: Don/doff right TED hose, Don/doff left TED hose  Lower body assist Assist for lower body dressing: Touching or steadying assistance (Pt > 75%)      Toileting Toileting Toileting activity did not occur: No continent bowel/bladder event Toileting steps completed by patient: Performs perineal hygiene, Adjust clothing after toileting Toileting steps completed by helper: Performs perineal hygiene Toileting Assistive Devices: Grab bar or rail  Toileting assist Assist level: Two helpers   Transfers Chair/bed transfer   Chair/bed transfer method: Stand pivot Chair/bed transfer assist level: Moderate assist (Pt 50 - 74%/lift or lower) Chair/bed transfer assistive device: Walker, Orthosis Mechanical lift: Ecologist     Max distance: 15 Assist level: Maximal assist (Pt 25 - 49%)   Wheelchair   Type: Manual Max wheelchair distance: 75 Assist Level: Dependent (Pt equals 0%)  Cognition Comprehension Comprehension assist level: Follows complex conversation/direction with extra time/assistive device  Expression Expression assist level: Expresses complex ideas: With extra time/assistive device  Social Interaction Social Interaction assist level: Interacts appropriately with others with medication or extra time (anti-anxiety, antidepressant).  Problem Solving Problem solving assist level: Solves complex 90% of the time/cues < 10% of the time  Memory Memory assist level: Recognizes or recalls 90% of the time/requires cueing < 10% of the time    Medical Problem List and Plan: 1. Debilitation secondary to mitral valve repair 04/30/2016 as well as recent history of subarachnoid hemorrhage with  left-sided residual weakness and dysarthria. Sternal precautions after mitral valve repair  Continue CIR  PRAFO, hand splints   Extensor tone in LUE, however, this appears to be functional at present, will consider treatment if necessary 2. DVT Prophylaxis/Anticoagulation: Coumadin therapy after mitral valve repair  INR remains therapeutic today. Pharmacy protocol  3. Pain Management: Oxycodone as needed  -muscle rub for left shoulder pain,mild sublux  -pt using sling when up with therapy  -keep left arm elevated while in bed/chair 4. Mood/panic attacks: Lexapro 10 mg daily at bedtime, trazodone 100 mg daily at bedtime, Klonopin 0.5 mg daily at bedtime and 3 times a day as needed 5. Neuropsych: This patient is ?not fully capable of making decisions on her own behalf. 6. Skin/Wound Care: Routine skin checks 7. Fluids/Electrolytes/Nutrition: Routine I&O  8. Dysphagia. Dysphagia #3 thin liquids. Tolerating 9. PAF with RVR. Continue amiodarone, Aldactone 50 mg daily, Tenormin 12.5 mg twice a day. Appreciate Cards recs, signed off.  10. Acute on chronic diastolic congestive heart failure.   Lasix 80 mg daily, increased to 60 BID on 6/22 .  daily weights  Monitor for any signs of fluid overload, weights stable  Filed Weights   05/29/16 0500 05/30/16 0431 05/31/16 0425  Weight: 108.818 kg (239 lb 14.4 oz) 106.6 kg (235 lb 0.2 oz) 107 kg (235 lb 14.3 oz)  11. Diabetes mellitus with peripheral neuropathy. Check blood sugars before meals and at bedtime.   Levemir increased to 30u daily on 7/2, increased to 35U on 7/6, increased to 40U on 7/8  Overall Improving, Continue to monitor 12. Leukocytosis:  Trending down  Currently afebrile  WBCs 10.5 on 7/3  UA negative, U culture with multiple species  Chest x-ray from 6/21 showing interstitial edema and? Infiltrate/atelectasis (appears to have slightly improved from previous x-ray) 13. AKI: Resolved  Creatinine 0.96 on 7/3    Encourage fluid intake 14. Constipation: Improved 15. ABLA  Hb 11.7 on 7/3  Cont to monitor 16. HTN  Cont meds  Stable  LOS (Days) 19 A FACE TO FACE EVALUATION WAS PERFORMED  Kishia Shackett Lorie Phenix 05/31/2016 8:01 AM

## 2016-06-01 ENCOUNTER — Inpatient Hospital Stay (HOSPITAL_COMMUNITY): Payer: Medicaid Other | Admitting: Occupational Therapy

## 2016-06-01 ENCOUNTER — Inpatient Hospital Stay (HOSPITAL_COMMUNITY): Payer: Self-pay | Admitting: Speech Pathology

## 2016-06-01 ENCOUNTER — Inpatient Hospital Stay (HOSPITAL_COMMUNITY): Payer: Self-pay | Admitting: Occupational Therapy

## 2016-06-01 ENCOUNTER — Inpatient Hospital Stay (HOSPITAL_COMMUNITY): Payer: Self-pay

## 2016-06-01 ENCOUNTER — Inpatient Hospital Stay (HOSPITAL_COMMUNITY): Payer: Medicaid Other | Admitting: Physical Therapy

## 2016-06-01 DIAGNOSIS — M62838 Other muscle spasm: Secondary | ICD-10-CM | POA: Insufficient documentation

## 2016-06-01 DIAGNOSIS — M6249 Contracture of muscle, multiple sites: Secondary | ICD-10-CM

## 2016-06-01 LAB — CBC WITH DIFFERENTIAL/PLATELET
BASOS ABS: 0.1 10*3/uL (ref 0.0–0.1)
Basophils Relative: 1 %
EOS ABS: 0.5 10*3/uL (ref 0.0–0.7)
EOS PCT: 5 %
HCT: 43.5 % (ref 36.0–46.0)
Hemoglobin: 12.9 g/dL (ref 12.0–15.0)
LYMPHS ABS: 2.8 10*3/uL (ref 0.7–4.0)
LYMPHS PCT: 26 %
MCH: 26 pg (ref 26.0–34.0)
MCHC: 29.7 g/dL — ABNORMAL LOW (ref 30.0–36.0)
MCV: 87.5 fL (ref 78.0–100.0)
MONO ABS: 0.6 10*3/uL (ref 0.1–1.0)
Monocytes Relative: 5 %
Neutro Abs: 7 10*3/uL (ref 1.7–7.7)
Neutrophils Relative %: 63 %
PLATELETS: 519 10*3/uL — AB (ref 150–400)
RBC: 4.97 MIL/uL (ref 3.87–5.11)
RDW: 17.3 % — AB (ref 11.5–15.5)
WBC: 11 10*3/uL — ABNORMAL HIGH (ref 4.0–10.5)

## 2016-06-01 LAB — BASIC METABOLIC PANEL
Anion gap: 6 (ref 5–15)
BUN: 17 mg/dL (ref 6–20)
CALCIUM: 10.8 mg/dL — AB (ref 8.9–10.3)
CO2: 29 mmol/L (ref 22–32)
CREATININE: 0.91 mg/dL (ref 0.44–1.00)
Chloride: 100 mmol/L — ABNORMAL LOW (ref 101–111)
GFR calc Af Amer: >60 mL/min (ref >60)
GLUCOSE: 163 mg/dL — AB (ref 65–99)
Potassium: 4.1 mmol/L (ref 3.5–5.1)
Sodium: 135 mmol/L (ref 135–145)

## 2016-06-01 LAB — PROTIME-INR
INR: 2.76 — AB (ref 0.00–1.49)
Prothrombin Time: 28.7 seconds — ABNORMAL HIGH (ref 11.6–15.2)

## 2016-06-01 LAB — GLUCOSE, CAPILLARY
Glucose-Capillary: 128 mg/dL — ABNORMAL HIGH (ref 65–99)
Glucose-Capillary: 151 mg/dL — ABNORMAL HIGH (ref 65–99)
Glucose-Capillary: 140 mg/dL — ABNORMAL HIGH (ref 65–99)
Glucose-Capillary: 218 mg/dL — ABNORMAL HIGH (ref 65–99)

## 2016-06-01 MED ORDER — BACLOFEN 5 MG HALF TABLET
5.0000 mg | ORAL_TABLET | Freq: Three times a day (TID) | ORAL | Status: DC
  Administered 2016-06-01 – 2016-06-04 (×10): 5 mg via ORAL
  Filled 2016-06-01 (×10): qty 1

## 2016-06-01 MED ORDER — WARFARIN SODIUM 5 MG PO TABS
5.0000 mg | ORAL_TABLET | Freq: Every day | ORAL | Status: DC
  Administered 2016-06-01 – 2016-06-03 (×3): 5 mg via ORAL
  Filled 2016-06-01 (×3): qty 1

## 2016-06-01 NOTE — Progress Notes (Signed)
Speech Language Pathology Discharge Summary  Patient Details  Name: Margaret [name] MRN: [id number] Date of Birth: September 28, 1956  Today's Date: 06/01/2016 SLP Individual Time: 1005-1100 SLP Individual Time Calculation (min): 55 min   Skilled Therapeutic Interventions:  Pt was seen for skilled ST targeting completion of education prior to discharge from [place] services.  Pt's husband and sister were intermittently present throughout today's therapy session and were provided with recommendations that pt have initial assistance for financial and medication management when transitioning home to prevent errors. Both verbalized understanding of and agreement with recommendations.  SLP administered the [employer] standardized cognitive assessment to measure progress from initial evaluation.  Pt scored 26/30 on assessment (n>/= 26) indicating normal cogntive functioning.   Pt reports feeling back to baseline.  As a result, no further ST needs indicated at this time.  All education is complete at this time and pt is ready for discharge on 7/13.  Pt left in dayroom with family present.       Patient has met 7 of 7 long term goals.  Patient to discharge at overall Modified Independent level.  Reasons goals not met: n/a   Clinical Impression/Discharge Summary:  Pt made functional gains while inpatient and is discharging from [place] services having met 7 out of 7 long term goals.  Pt is mod I for semi-complex cognitive tasks and is at baseline for cognitive functioning.  Pt's diet has been advanced to regular textures and thin liquids which she is consuming with mod I use of universal swallowing precautions.  Speech intelligibility is returned to baseline but remains limited by poorly fitting dentures and missing dentition.  No word finding difficulty during conversations.  Pt is discharging home with 24/7 supervision from family with recommendations for initial supervision for medication and financial management.  Pt and  family education is complete at this time.  No further ST needs warranted.    Care Partner:  Caregiver Able to Provide Assistance: Yes  Type of Caregiver Assistance: Physical;Cognitive  Recommendation:  None      Equipment: none recommended by SLP    Reasons for discharge: Treatment goals met   Patient/Family Agrees with Progress Made and Goals Achieved: Yes   Function:  Eating Eating                 Cognition Comprehension Comprehension assist level: Follows complex conversation/direction with extra time/assistive device  Expression   Expression assist level: Expresses complex ideas: With extra time/assistive device  Social Interaction Social Interaction assist level: Interacts appropriately with others with medication or extra time (anti-anxiety, antidepressant).  Problem Solving Problem solving assist level: Solves complex 90% of the time/cues < 10% of the time  Memory Memory assist level: More than reasonable amount of time   Windell Moulding L 06/01/2016, 11:59 AM

## 2016-06-01 NOTE — Progress Notes (Signed)
Social Work Patient ID: Margaret [name], female   DOB: 18-Jan-1956, 60 y.o.   MRN: [id number] Met with pt and husband along with [facility] who was here to do an assessment for their program. Discussed will need to go ahead and get her equipment and follow up due to would not be until Aug 1st if She is eligible for it. Husband still looking for assistance with the ramp for home and has some numbers to contact. Agency he thought he had to do the ramp backed out. Family has been here to go through therapies With pt this week to prepare for her discharge Thursday.

## 2016-06-01 NOTE — Progress Notes (Signed)
Occupational Therapy Note  Patient Details  Name: Margaret [name] MRN: [id number] Date of Birth: 10/19/1956  Today's Date: 06/01/2016 OT Individual Time: 0930-1000 OT Individual Time Calculation (min): 30 min   Pt denied pain Individual Therapy  Pt resting in w/c with sister present.  Focus on sit<>stand from lower surface and stand pivot transfer w/c<>regular arm chair.  Pt performed sit<>stand from w/c with supervision.  Pt performed sit<>stand from arm chair X 4.  Pt initially required min A for task but progressed to supervision with practice.  Pt required assistance with placing her LLE during transfers.  Pt employs self-talk to assure proper sequencing during transfers.  Pt's left knee buckled X 1 and pt sat back into chair.  Pt remained in w/c with sister present and all needs within reach.    Leotis Shames [facility] 06/01/2016, 12:03 PM

## 2016-06-01 NOTE — Progress Notes (Signed)
[place] PHYSICAL MEDICINE & REHABILITATION     PROGRESS NOTE  Subjective/Complaints:  Pt laying in bed after just haven woken up.  She has increased tone in LUE.   ROS: Denies CP, SOB, nausea, vomiting, diarrhea.  Objective: Vital Signs: Blood pressure 132/63, pulse 61, temperature 98 F (36.7 C), temperature source Oral, resp. rate 16, weight 107.1 kg (236 lb 1.8 oz), SpO2 94 %. No results found. No results for input(s): WBC, HGB, HCT, PLT in the last 72 hours. No results for input(s): NA, K, CL, GLUCOSE, BUN, CREATININE, CALCIUM in the last 72 hours.  Invalid input(s): CO CBG (last 3)   Recent Labs  05/31/16 1629 05/31/16 2115 06/01/16 0644  GLUCAP 125* 162* 128*    Wt Readings from Last 3 Encounters:  06/01/16 107.1 kg (236 lb 1.8 oz)  05/12/16 105.008 kg (231 lb 8 oz)  04/14/16 117.935 kg (260 lb)    Physical Exam:  BP 132/63 mmHg  Pulse 61  Temp(Src) 98 F (36.7 C) (Oral)  Resp 16  Wt 107.1 kg (236 lb 1.8 oz)  SpO2 94% Constitutional: She appears well-developed. Obese.  HENT: Normocephalic and atraumatic.  Eyes: EOM are normal. Right eye exhibits no discharge. Left eye exhibits no discharge.  Cardiovascular: Regular Rate and rhythm. Respiratory: No respiratory distress. Effort normal.  GI: Soft. Bowel sounds are normal. Non-tender.  No guarding. Musculoskeletal: She exhibits edema. No tenderness. Left shoulder with kineseotaping in place.  Neurological: She is alert. A cranial nerve deficit is present.  Left facial weakness  Dysarthria.  Memory fair. RUE/RLE: 5/5 proximal to distal LUE 0/5 proximal to distal.  LLE: 2/5 proximal to distal. mAS  1+/4 Left elbow extension  1+/4 ankle plantar flexion Skin: Chest incision clean dry, healing. Vascular changes bilateral lower extremities  Psychiatric: She is slowed at times. Normal behavior.  Assessment/Plan: 1. 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 Physiatrist is providing close team supervision and 24 hour management of active medical problems listed below. Physiatrist and rehab team continue to assess barriers to discharge/monitor patient progress toward functional and medical goals.  Function:  Bathing Bathing position   Position: Shower  Bathing parts Body parts bathed by patient: Chest, Abdomen, Front perineal area, Buttocks, Right upper leg, Left upper leg, Right arm Body parts bathed by helper: Right lower leg, Left lower leg, Back  Bathing assist Assist Level: Touching or steadying assistance(Pt > 75%)      Upper Body Dressing/Undressing Upper body dressing   What is the patient wearing?: Pull over shirt/dress Bra - Perfomed by patient: Thread/unthread right bra strap, Thread/unthread left bra strap, Hook/unhook bra (pull down sports bra)   Pull over shirt/dress - Perfomed by patient: Thread/unthread right sleeve, Put head through opening, Pull shirt over trunk, Thread/unthread left sleeve Pull over shirt/dress - Perfomed by helper: Thread/unthread left sleeve        Upper body assist Assist Level: Set up, More than reasonable time, Supervision or verbal cues   Set up : To obtain clothing/put away  Lower Body Dressing/Undressing Lower body dressing   What is the patient wearing?: Pants, Ted Hose, Non-skid slipper socks Underwear - Performed by patient: Thread/unthread right underwear leg Underwear - Performed by helper: Thread/unthread left underwear leg, Pull underwear up/down Pants- Performed by patient: Thread/unthread right pants leg Pants- Performed  by helper: Thread/unthread left pants leg, Pull pants up/down, Thread/unthread right pants leg   Non-skid slipper socks- Performed by helper: Don/doff right sock, Don/doff left  sock Socks - Performed by patient: Don/doff right sock Socks - Performed by helper: Don/doff left sock Shoes - Performed by patient: Don/doff right shoe, Fasten right Shoes - Performed by helper: Don/doff left shoe, Fasten left   AFO - Performed by helper: Don/doff left AFO   TED Hose - Performed by helper: Don/doff right TED hose, Don/doff left TED hose  Lower body assist Assist for lower body dressing: Touching or steadying assistance (Pt > 75%)      Toileting Toileting Toileting activity did not occur: No continent bowel/bladder event Toileting steps completed by patient: Performs perineal hygiene, Adjust clothing after toileting Toileting steps completed by helper: Adjust clothing prior to toileting, Performs perineal hygiene, Adjust clothing after toileting Toileting Assistive Devices: Grab bar or rail  Toileting assist Assist level: Two helpers   Transfers Chair/bed transfer   Chair/bed transfer method: Stand pivot Chair/bed transfer assist level: Moderate assist (Pt 50 - 74%/lift or lower) Chair/bed transfer assistive device: Walker, Orthosis Mechanical lift: Ecologist     Max distance: 15 Assist level: Maximal assist (Pt 25 - 49%)   Wheelchair   Type: Manual Max wheelchair distance: 75 Assist Level: Dependent (Pt equals 0%)  Cognition Comprehension Comprehension assist level: Follows complex conversation/direction with extra time/assistive device  Expression Expression assist level: Expresses complex ideas: With extra time/assistive device  Social Interaction Social Interaction assist level: Interacts appropriately with others with medication or extra time (anti-anxiety, antidepressant).  Problem Solving Problem solving assist level: Solves complex 90% of the time/cues < 10% of the time  Memory Memory assist level: Recognizes or recalls 90% of the time/requires cueing < 10% of the time    Medical Problem List and Plan: 1. Debilitation  secondary to mitral valve repair 04/30/2016 as well as recent history of subarachnoid hemorrhage with left-sided residual weakness and dysarthria. Sternal precautions after mitral valve repair  Continue CIR  PRAFO, hand splints  2. DVT Prophylaxis/Anticoagulation: Coumadin therapy after mitral valve repair  INR remains therapeutic today. Pharmacy protocol  3. Pain Management: Oxycodone as needed  -muscle rub for left shoulder pain,mild sublux  -pt using sling when up with therapy  -keep left arm elevated while in bed/chair 4. Mood/panic attacks: Lexapro 10 mg daily at bedtime, trazodone 100 mg daily at bedtime, Klonopin 0.5 mg daily at bedtime and 3 times a day as needed 5. Neuropsych: This patient is ?not fully capable of making decisions on her own behalf. 6. Skin/Wound Care: Routine skin checks 7. Fluids/Electrolytes/Nutrition: Routine I&O  8. Dysphagia. Dysphagia #3 thin liquids. Tolerating 9. PAF with RVR. Continue amiodarone, Aldactone 50 mg daily, Tenormin 12.5 mg twice a day. Appreciate Cards recs, signed off.  10. Acute on chronic diastolic congestive heart failure.   Lasix 80 mg daily, increased to 60 BID on 6/22 .  daily weights  Monitor for any signs of fluid overload, weights stable  Filed Weights   05/30/16 0431 05/31/16 0425 06/01/16 0436  Weight: 106.6 kg (235 lb 0.2 oz) 107 kg (235 lb 14.3 oz) 107.1 kg (236 lb 1.8 oz)  11. Diabetes mellitus with peripheral neuropathy. Check blood sugars before meals and at bedtime.   Levemir increased to 30u daily on 7/2, increased to 35U on 7/6, increased to 40U on 7/8  Overall Improving, Continue to monitor 12. Leukocytosis: Trending down  Currently afebrile  WBCs 10.5 on 7/3  UA negative, U culture with multiple species  Chest x-ray from 6/21 showing interstitial edema and?Infiltrate/atelectasis (appears to have slightly improved from previous x-ray)  Labs pending for today 13. AKI: Resolved  Creatinine  0.96 on 7/3   Encourage fluid intake  Labs pending for today 14. Constipation: Improved 15. ABLA  Hb 11.7 on 7/3  Cont to monitor  Labs pending for today 16. HTN  Cont meds  Stable 17. Spasticity   Increasing tone, will start Baclofen 5mg  TID and increase as necessary  LOS (Days) 20 A FACE TO FACE EVALUATION WAS PERFORMED  Ahad Colarusso Lorie Phenix 06/01/2016 9:07 AM

## 2016-06-01 NOTE — Progress Notes (Signed)
Occupational Therapy Session Note  Patient Details  Name: Margaret [name] MRN: [id number] Date of Birth: November 16, 1956  Today's Date: 06/01/2016 OT Individual Time: 7357-8978 OT Individual Time Calculation (min): 60 min    Short Term Goals: Week 1:  OT Short Term Goal 1 (Week 1): Pt will be mod I with bed mobility with HOB down as a precursor for ADLs  OT Short Term Goal 1 - Progress (Week 1): Progressing toward goal OT Short Term Goal 2 (Week 1): Pt will complete UB bathing and dressing seated EOB unsupported with moderate assistance  OT Short Term Goal 2 - Progress (Week 1): Met OT Short Term Goal 3 (Week 1): Pt will perform toilet transfer with mod assist  OT Short Term Goal 3 - Progress (Week 1): Met OT Short Term Goal 4 (Week 1): Pt will independently verbalize and adhere to sternal precautions 100% of the time OT Short Term Goal 4 - Progress (Week 1): Met Week 2:  OT Short Term Goal 1 (Week 2): Pt will be educated on tub/shower transfers using tub transfer bench OT Short Term Goal 2 (Week 2): Pt will be min assist with stand pivot transfer w/c to/from [facility] OT Short Term Goal 3 (Week 2): Pt will be educated on a LUE HEP  OT Short Term Goal 4 (Week 2): Pt will perform sit to/from stands with min assist as a precursor for ADLs  Week 3:  OT Short Term Goal 1 (Week 3): LTG=STG OT Short Term Goal 2 (Week 3): Complete Family education with husband  Skilled Therapeutic Interventions/Progress Updates:    Pt seen for skilled OT to facilitate ADL skills, dynamic balance, adaptive techniques with family education with pt's sister who will be one of her caregivers. Pt needed to toilet and pt's sister able to observe transfers. Did not use Steady lift as pt will not have this at home. Used hemicane with L AFO on.  Pt is able to sit >< stand from w/c and toilet with close S but needs mod A to support her in stand pivot turns as she has great difficulty moving L leg. Pt needs assist to wt shift to R  and assist to align leg safely before sitting. Pt is able to reach backside for cleansing by wt shifting to her left.  Sister observed dressing techniques. Due to her improved balance, pt was able to pull up underwear 80% and she pulled her pants up 100%.  Pt and sister very happy with her progress. Discussed the need to focus on safe transfers.  Pt in room with all needs met.  Therapy Documentation Precautions:  Precautions Precautions: Fall, Sternal Precaution Comments: L hemiparesis; L neglect; L subluxed shoulder Required Braces or Orthoses: Other Brace/Splint (left shoulder taped) Restrictions Weight Bearing Restrictions: Yes (sternal precatuions) LLE Weight Bearing: Weight bearing as tolerated Other Position/Activity Restrictions: sternal precautions      Pain: Pain Assessment Pain Assessment: No/denies pain ADL:   See Function Navigator for Current Functional Status.   Therapy/Group: Individual Therapy  SAGUIER,JULIA 06/01/2016, 12:26 PM

## 2016-06-01 NOTE — Progress Notes (Signed)
Physical Therapy Session Note  Patient Details  Name: Margaret [name] MRN: [id number] Date of Birth: September 15, 1956  Today's Date: 06/01/2016 PT Individual Time: 1105-1206 PT Individual Time Calculation (min): 61 min   Short Term Goals: Week 2:  PT Short Term Goal 1 (Week 2): Pt will perform bed mobility with consistent mod A (currently mod A) PT Short Term Goal 1 - Progress (Week 2): Progressing toward goal PT Short Term Goal 2 (Week 2): Pt will perform bed <> chair transfers with mod A and maintain sternal precautions (max A +1) PT Short Term Goal 2 - Progress (Week 2): Progressing toward goal PT Short Term Goal 3 (Week 2): Pt will tolerate standing x 5-8 minutes at time for standing balance and gait training activities with mod A PT Short Term Goal 3 - Progress (Week 2): Met PT Short Term Goal 4 (Week 2): Pt will perform ambulation with appropriate AD x 15' with max A (max A +  w/c follow (+2 for safety)) PT Short Term Goal 4 - Progress (Week 2): Progressing toward goal  Skilled Therapeutic Interventions/Progress Updates:    Pt received with family (sister, brother-in-law, and husband) present for session. Discussed d/c plans with pt, as husband reports they will not have ramp installed upon pt's d/c from CIR. Pt and husband report they are going to use EMS transport home; discussed pt leaving house for medical appointments & pt inquired about scooting up steps on bottom but PT educated pt that this was not safe as she would have difficulty transferring to sit/stand on steps. Discussed bumping pt up steps in w/c but husband & pt recognize this is not safe for them. Pt became tearful over situation and PT and family provided therapeutic listening & support. Educated pt that she could use EMS transport to medical appointments. Transported pt to gym & pt performed stand pivot w/c<>mat table with hemiwalker & min A. Pt return demonstrated task with husband. Pt and husband able to direct each other  during task with PT providing minimal verbal cuing for foot placement & advancement. Pt also required minimal assistance for foot placement. At end of session pt left in w/c in care of family returning to room.   Therapy Documentation Precautions:  Precautions Precautions: Fall, Sternal Precaution Comments: L hemiparesis; L neglect; L subluxed shoulder Required Braces or Orthoses: Other Brace/Splint (left shoulder taped) Restrictions Weight Bearing Restrictions: Yes (sternal precatuions) LLE Weight Bearing: Weight bearing as tolerated Other Position/Activity Restrictions: sternal precautions   See Function Navigator for Current Functional Status.   Therapy/Group: Individual Therapy  [name] 06/01/2016, 12:16 PM

## 2016-06-01 NOTE — Progress Notes (Signed)
ANTICOAGULATION CONSULT NOTE - Follow Up Consult  Pharmacy Consult for couamdin Indication: MVR and afib  Allergies  Allergen Reactions  . Codeine Hives, Itching and Other (See Comments)    "breathing problems"    Patient Measurements: Weight: 236 lb 1.8 oz (107.1 kg)  Vital Signs: Temp: 98 F (36.7 C) (07/10 0436) Temp Source: Oral (07/10 0436) BP: 132/63 mmHg (07/10 0436) Pulse Rate: 61 (07/10 0436)  Labs:  Recent Labs  05/30/16 0609 05/31/16 0632 06/01/16 0531  LABPROT 27.0* 27.4* 28.7*  INR 2.54* 2.59* 2.76*    Estimated Creatinine Clearance: 75.8 mL/min (by C-G formula based on Cr of 0.96).  Assessment: 60 yo female with hx of MVR and afib. Currently therapeutic on coumadin with INR today at 2.76. CBC stable on 7/3. No s/s bleeding.    Goal of Therapy:  INR 2.5-3.5 Monitor platelets by anticoagulation protocol: Yes   Plan:  Coumadin 5 mg po daily Daily INR Monitor CBC, s/sx bleeding  Maryanna Shape, PharmD, BCPS  Clinical Pharmacist  Pager: [phone]   06/01/2016,12:42 PM  Pager: [phone]

## 2016-06-02 ENCOUNTER — Inpatient Hospital Stay (HOSPITAL_COMMUNITY): Payer: Self-pay

## 2016-06-02 ENCOUNTER — Inpatient Hospital Stay (HOSPITAL_COMMUNITY): Payer: Medicaid Other | Admitting: Physical Therapy

## 2016-06-02 ENCOUNTER — Inpatient Hospital Stay (HOSPITAL_COMMUNITY): Payer: Medicaid Other

## 2016-06-02 ENCOUNTER — Inpatient Hospital Stay (HOSPITAL_COMMUNITY): Payer: Self-pay | Admitting: Occupational Therapy

## 2016-06-02 ENCOUNTER — Inpatient Hospital Stay (HOSPITAL_COMMUNITY): Payer: Medicaid Other | Admitting: Occupational Therapy

## 2016-06-02 LAB — GLUCOSE, CAPILLARY
GLUCOSE-CAPILLARY: 140 mg/dL — AB (ref 65–99)
GLUCOSE-CAPILLARY: 209 mg/dL — AB (ref 65–99)
GLUCOSE-CAPILLARY: 139 mg/dL — AB (ref 65–99)
GLUCOSE-CAPILLARY: 151 mg/dL — AB (ref 65–99)

## 2016-06-02 LAB — PROTIME-INR
INR: 2.62 — AB (ref 0.00–1.49)
Prothrombin Time: 27.7 seconds — ABNORMAL HIGH (ref 11.6–15.2)

## 2016-06-02 MED ORDER — INSULIN DETEMIR 100 UNIT/ML ~~LOC~~ SOLN
45.0000 [IU] | Freq: Every day | SUBCUTANEOUS | Status: DC
  Administered 2016-06-03 – 2016-06-04 (×2): 45 [IU] via SUBCUTANEOUS
  Filled 2016-06-02 (×2): qty 0.45

## 2016-06-02 NOTE — Patient Care Conference (Signed)
Inpatient RehabilitationTeam Conference and Plan of Care Update Date: 06/02/2016   Time: 10:30 AM    Patient Name: Margaret [name]      Medical Record Number: [id number]  Date of Birth: 02-28-1956 Sex: Female         Room/Bed: 4W04C/4W04C-01 Payor Info: Payor: MEDICAID [place] / Plan: MEDICAID OF [place] / Product Type: *No Product type* /    Admitting Diagnosis: Milltralvalve Replacment HO CVA  Admit Date/Time:  05/12/2016  6:13 PM Admission Comments: No comment available   Primary Diagnosis:  Cerebrovascular accident (CVA) due to occlusion of right carotid artery (HCC) Principal Problem: Cerebrovascular accident (CVA) due to occlusion of right carotid artery [facility])  Patient Active Problem List   Diagnosis Date Noted  . Muscle spasticity   . Adjustment disorder with anxious mood   . Subtherapeutic anticoagulation   . PAF (paroxysmal atrial fibrillation) (HCC)   . Hemiplegia of nondominant side, late effect of cerebrovascular disease (HCC)   . 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 Hemiparesis and dysphasia as late effect of cerebrovascular accident (CVA) (HCC)   . 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 Interstitial edema   . Type 2 diabetes mellitus with peripheral neuropathy (HCC)   . Debilitated 05/12/2016  . Left hemiparesis (HCC) 05/12/2016  . History of subarachnoid hemorrhage   . SOB (shortness of breath)   . History of CVA with residual deficit   . Dysarthria, post-stroke   . Chronic obstructive pulmonary disease (HCC)   . Tachypnea   . Bradycardia   . Hypokalemia   . Leukocytosis   . SIRS (systemic inflammatory response syndrome) (HCC)   . Acute blood loss anemia   . Thrombocytosis (HCC)   . S/P mitral valve repair 04/30/2016  . 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 Acute on chronic diastolic congestive heart failure due to valvular disease  (HCC) 04/14/2016  . CHF due to valvular disease, acute on chronic, diastolic 04/14/2016  . Chronic diastolic congestive heart failure due to valvular disease (HCC) 04/13/2016  . History of stroke 04/13/2016  . Cerebrovascular accident (CVA) due to occlusion of right carotid artery (HCC) 03/25/2016  . Splenic infarct 03/25/2016  . Type 2 diabetes mellitus with circulatory disorder (HCC) 03/25/2016  . Mitral regurgitation 03/06/2016  . HLD (hyperlipidemia) 03/06/2016  . Right carotid artery occlusion   . Epigastric abdominal tenderness on direct palpation   . Endocarditis of mitral valve   . Positive ANA (antinuclear antibody)   . Streptococcus viridans infection 02/04/2016  . Cerebrovascular accident (CVA) due to embolism of cerebral artery (HCC)   . Subarachnoid hemorrhage (HCC) 02/01/2016  . Stroke (HCC)   . Visual changes   . Pulmonary infiltrates 01/23/2016  . Pulmonary hypertension assoc with unclear multi-factorial mechanisms (HCC) 01/23/2016  . 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 Insulin dependent diabetes mellitus (HCC) 01/14/2016  . Essential hypertension 01/14/2016  . Hepatic cirrhosis (HCC) 01/14/2016  . COPD (chronic obstructive pulmonary disease) (HCC) 04/27/2012  . Chronic diastolic CHF (congestive heart failure) (HCC) 04/27/2012  . Morbid obesity (HCC) 04/27/2012  . Hypoxia 04/27/2012  . Dyspnea on exertion 04/27/2012  . Smoker 04/27/2012    Expected Discharge Date: Expected Discharge Date: 06/04/16  Team Members Present: Physician leading conference: Dr. Maryla Morrow Social Worker Present: Dossie Der, LCSW Nurse Present: Carmie End, RN PT Present: Aleda Grana, PT OT Present: Primitivo Gauze, OT  Current Status/Progress Goal Weekly Team Focus  Medical   Debilitation secondary to mitral valve repair 04/30/2016 as well as recent history of subarachnoid hemorrhage with left-sided residual weakness and dysarthria.  Improve safety, transfers, DM meds  See  above   Bowel/Bladder   continent but some incontinence esp @ night lbm7/10  continent   monitor b& b pattern, timed toiletting   Swallow/Nutrition/ Hydration             ADL's   stand pivot transfers min to mod A using hemi cane to toilet/ shower stall, set up UB dressing, mod A LB dressing and toileting  standing balance with min A, bathing min A, UB dressing min A,  and toilet transfers with min  A; toileting and  LB dressing  with mod A  family education to prepare for discharge   Mobility   Min A for stand pivot with hemiwalker, mod/max A for ambulating short distances with rail in hallway  min A for stand pivot transfers to bed, mod a for car transfer, min a for bed mobility, mod a for ambulation x 25 ft   pt and family education/ d/c planning, transfer training   Communication             Safety/Cognition/ Behavioral Observations            Pain   rarely requires pain meds, ultram & oxy prn  pain free or minimal pain  monitor pain & treat as needed   Skin   sternal incision healing, antifungal powder to groin ( looking better)  no further breakdown, no infection  assess q shift      *See Care Plan and progress notes for long and short-term goals.  Barriers to Discharge: Safety, transfers, DM, Leukocytosis, Spasticity    Possible Resolutions to Barriers:  pt and family edu, follow labs, optimize DM meds, CXR, optimize spasticity meds    Discharge Planning/Teaching Needs:  Family education this week in preparation for discharge Thursday-still workin on ramp issue      Team Discussion:  Reaching goals of min /miod assist level. Family education this week. RN to do insulin education with pt and husband. MD started spascity medication and chest x-ray ordered for elevated white count. DC SP met goals.  Revisions to Treatment Plan:  DC 7/13 Met Speech goals DC no follow up recommended   Continued Need for Acute Rehabilitation Level of Care: The patient requires daily medical  management by a physician with specialized training in physical medicine and rehabilitation for the following conditions: Daily direction of a multidisciplinary physical rehabilitation program to ensure safe treatment while eliciting the highest outcome that is of practical value to the patient.: Yes Daily medical management of patient stability for increased activity during participation in an intensive rehabilitation regime.: Yes Daily analysis of laboratory values and/or radiology reports with any subsequent need for medication adjustment of medical intervention for : Cardiac problems;Neurological problems;Diabetes problems;Other  Nastashia Gallo, Lemar Livings 06/03/2016, 8:30 AM

## 2016-06-02 NOTE — Progress Notes (Signed)
ANTICOAGULATION CONSULT NOTE - Follow Up Consult  Pharmacy Consult for couamdin Indication: MVR and afib  Allergies  Allergen Reactions  . Codeine Hives, Itching and Other (See Comments)    "breathing problems"    Patient Measurements: Weight: 234 lb 9.1 oz (106.4 kg)  Vital Signs: Temp: 98.2 F (36.8 C) (07/11 0515) Temp Source: Oral (07/11 0515) BP: 150/65 mmHg (07/11 0515) Pulse Rate: 64 (07/11 0515)  Labs:  Recent Labs  05/31/16 [id number] 06/01/16 0531 06/01/16 1236 06/02/16 0634  HGB  --   --  12.9  --   HCT  --   --  43.5  --   PLT  --   --  519*  --   LABPROT 27.4* 28.7*  --  27.7*  INR 2.59* 2.76*  --  2.62*  CREATININE  --   --  0.91  --     Estimated Creatinine Clearance: 79.7 mL/min (by C-G formula based on Cr of 0.91).  Assessment: 60 yo female with hx of MVR and afib. Currently therapeutic on coumadin with INR today at 2.62. CBC stable on 7/10. No s/s bleeding.    Goal of Therapy:  INR 2.5-3.5 Monitor platelets by anticoagulation protocol: Yes   Plan:  Coumadin 5 mg po daily Daily INR Monitor CBC, s/sx bleeding  Maryanna Shape, PharmD, BCPS  Clinical Pharmacist  Pager: [phone]   06/02/2016,12:41 PM  Pager: [phone]

## 2016-06-02 NOTE — Progress Notes (Signed)
Occupational Therapy Session Note  Patient Details  Name: Margaret [name] MRN: [id number] Date of Birth: Oct 13, 1956  Today's Date: 06/02/2016 OT Individual Time: [id number] Individual Time Calculation (min): 44 min      Skilled Therapeutic Interventions/Progress Updates:    Pt participated in skilled OT tx focusing on turning during functional transfers. Pt completed toilet transfer with hemi walker and Min A for steadying assistance and guidance of left LE. Pt repeats steps of transfer technique aloud including weight shifting and positioning of feet. Pt completed toileting with Max A for clothing mgt after toileting. Pt was able to doff pants and complete pericare while standing with steadying assistance. For consistency with level of assistance, pt completed 2 additional functional toilet transfers with Min A while turning with hemi walker. During final transfer, pt maneuvered L LE without manual guidance from therapist. Pt completed hand washing and grooming care while standing sinkside with close supervision. Pt was in high spirits at end of session, left with all needs within reach. Pt reported that husband will be present tomorrow and that she will call him to tell him the times of therapy so that he can be present for education. No pain reported today.   Therapy Documentation Precautions:  Precautions Precautions: Fall, Sternal Precaution Comments: L hemiparesis; L neglect; L subluxed shoulder Required Braces or Orthoses: Other Brace/Splint (left shoulder taped) Restrictions Weight Bearing Restrictions: Yes (sternal precautions) LLE Weight Bearing: Weight bearing as tolerated Other Position/Activity Restrictions: sternal precautions General:   Vital Signs: Therapy Vitals Temp: 98.2 F (36.8 C) Temp Source: Oral Pulse Rate: 65 Resp: 16 BP: (!) 172/69 mmHg Patient Position (if appropriate): Sitting Oxygen Therapy SpO2: 96 % O2 Device: Not Delivered    See Function  Navigator for Current Functional Status.   Therapy/Group: Individual Therapy  Ioan Landini A Sebastian Lurz 06/02/2016, 4:09 PM

## 2016-06-02 NOTE — Progress Notes (Signed)
Occupational Therapy Note  Patient Details  Name: Margaret [name] MRN: [id number] Date of Birth: [birth year]  Today's Date: 06/02/2016 OT Individual Time: [id number] OT Individual Time Calculation (min): 43 min   Pt denied pain Individual therapy  Pt resting in w/c upon arrival with husband present.  Pt stated she was incontinent of bladder and needed to get to bathroom.  Pt's husband assisted with stand pivot transfer and toileting tasks.  Pt directed care with transfer and toileting tasks.  Pt's husband will benefit from continued education and practice with transfers.  Pt requires more than a reasonable amount of time to initiate and complete tasks.  Pt incorporates self talk through all tasks.    Leotis Shames [facility] 06/02/2016, 12:15 PM

## 2016-06-02 NOTE — Progress Notes (Signed)
Occupational Therapy Session Note  Patient Details  Name: KALA AMBRIZ MRN: [id number] Date of Birth: [birth year]  Today's Date: 06/02/2016 OT Individual Time: 0930-1030 OT Individual Time Calculation (min): 60 min    Short Term Goals: Week 1:  OT Short Term Goal 1 (Week 1): Pt will be mod I with bed mobility with HOB down as a precursor for ADLs  OT Short Term Goal 1 - Progress (Week 1): Progressing toward goal OT Short Term Goal 2 (Week 1): Pt will complete UB bathing and dressing seated EOB unsupported with moderate assistance  OT Short Term Goal 2 - Progress (Week 1): Met OT Short Term Goal 3 (Week 1): Pt will perform toilet transfer with mod assist  OT Short Term Goal 3 - Progress (Week 1): Met OT Short Term Goal 4 (Week 1): Pt will independently verbalize and adhere to sternal precautions 100% of the time OT Short Term Goal 4 - Progress (Week 1): Met Week 2:  OT Short Term Goal 1 (Week 2): Pt will be educated on tub/shower transfers using tub transfer bench OT Short Term Goal 2 (Week 2): Pt will be min assist with stand pivot transfer w/c to/from [facility] OT Short Term Goal 3 (Week 2): Pt will be educated on a LUE HEP  OT Short Term Goal 4 (Week 2): Pt will perform sit to/from stands with min assist as a precursor for ADLs  Week 3:  OT Short Term Goal 1 (Week 3): LTG=STG OT Short Term Goal 2 (Week 3): Complete Family education with husband  Skilled Therapeutic Interventions/Progress Updates:    Pt seen for skilled OT to facilitate safe transfers and toileting skills with family education with spouse.  Pt state her spouse should be arriving any minute and she did need to use the bathroom but wanted to wait until his arrival for him to practice toilet transfers and dressing skills with her. Discussing with pt that she had felt very nauseated ahead of time. Pt suddenly had urge to go. Wheeled pt into bathroom and once she stood to transfer had a loose bowel movement in her brief.  Pt  stood for therapist to doff brief and assist with cleansing thighs and buttocks prior to her sitting on toilet.  Pt was then able to wt shift lean to left to use R hand to cleanse self.  Spouse arrived at this point. He participated actively with standing with her providing touching A as she pulled pants over hips 80% of the way. Therapist demonstrated toilet to w/c then spouse return demonstrated w/c >< toilet.  Pt is able to verbalize all steps of transfer and what she is doing so she does not need cues. Spouse did well supporting her but would benefit from further practice. They were taken to tub room to practice w/c to tub bench transfers set up in same manner as home.  She will need to ambulate several steps into her bathroom at home, but time of session did not allow for that today.  Pt taken back to room with all needs met.  Therapy Documentation Precautions:  Precautions Precautions: Fall, Sternal Precaution Comments: L hemiparesis; L neglect; L subluxed shoulder Required Braces or Orthoses: Other Brace/Splint (left shoulder taped) Restrictions Weight Bearing Restrictions: Yes (sternal precautions) LLE Weight Bearing: Weight bearing as tolerated Other Position/Activity Restrictions: sternal precautions    Pain: Pain Assessment Pain Assessment: No/denies pain   ADL:  See Function Navigator for Current Functional Status.   Therapy/Group: Individual Therapy  SAGUIER,JULIA  06/02/2016, 12:43 PM

## 2016-06-02 NOTE — Plan of Care (Signed)
Problem: RH Bed Mobility Goal: LTG Patient will perform bed mobility with assist (PT) LTG: Patient will perform bed mobility with assistance, with/without cues (PT).  With hospital bed features; downgrade 2/2 slow progress  Problem: RH Stairs Goal: LTG Patient will ambulate up and down stairs w/assist (PT) LTG: Patient will ambulate up and down # of stairs with assistance (PT)  3 steps (3 inches) with 1 rail; downgrade 2/2 slow progress

## 2016-06-02 NOTE — Progress Notes (Signed)
Margaret [name] PHYSICAL MEDICINE & REHABILITATION     PROGRESS NOTE  Subjective/Complaints:  Pt laying in bed.  She states she slept very well overnight.  ROS: Denies CP, SOB, nausea, vomiting, diarrhea.  Objective: Vital Signs: Blood pressure 150/65, pulse 64, temperature 98.2 F (36.8 C), temperature source Oral, resp. rate 18, weight 106.4 kg (234 lb 9.1 oz), SpO2 94 %. No results found.  Recent Labs  06/01/16 1236  WBC 11.0*  HGB 12.9  HCT 43.5  PLT 519*    Recent Labs  06/01/16 1236  NA 135  K 4.1  CL 100*  GLUCOSE 163*  BUN 17  CREATININE 0.91  CALCIUM 10.8*   CBG (last 3)   Recent Labs  06/01/16 1648 06/01/16 2036 06/02/16 0633  GLUCAP 140* 218* 140*    Wt Readings from Last 3 Encounters:  06/02/16 106.4 kg (234 lb 9.1 oz)  05/12/16 105.008 kg (231 lb 8 oz)  04/14/16 117.935 kg (260 lb)    Physical Exam:  BP 150/65 mmHg  Pulse 64  Temp(Src) 98.2 F (36.8 C) (Oral)  Resp 18  Wt 106.4 kg (234 lb 9.1 oz)  SpO2 94% Constitutional: She appears well-developed. Obese.  HENT: Normocephalic and atraumatic.  Eyes: EOM are normal. Right eye exhibits no discharge. Left eye exhibits no discharge.  Cardiovascular: Regular Rate and rhythm. Respiratory: No respiratory distress. Effort normal.  GI: Soft. Bowel sounds are normal. Non-tender.  No guarding. Musculoskeletal: She exhibits edema. No tenderness. Left shoulder with kineseotaping in place.  Neurological: She is alert. A cranial nerve deficit is present.  Left facial weakness  Dysarthria.  Memory fair. RUE/RLE: 5/5 proximal to distal LUE 0/5 proximal to distal.  LLE: 2/5 proximal to distal. mAS  1+/4 Left elbow extension (improved)  1+/4 ankle plantar flexion (improved) Skin: Chest incision clean dry, healing. Vascular changes bilateral lower extremities  Psychiatric: She is slowed at times. Normal behavior.  Assessment/Plan: 1. 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 Physiatrist is providing close team supervision and 24 hour management of active medical problems listed below. Physiatrist and rehab team continue to assess barriers to discharge/monitor patient progress toward functional and medical goals.  Function:  Bathing Bathing position   Position: Shower  Bathing parts Body parts bathed by patient: Chest, Abdomen, Front perineal area, Buttocks, Right upper leg, Left upper leg, Right arm Body parts bathed by helper: Right lower leg, Left lower leg, Back  Bathing assist Assist Level: Touching or steadying assistance(Pt > 75%)      Upper Body Dressing/Undressing Upper body dressing   What is the patient wearing?: Pull over shirt/dress Bra - Perfomed by patient: Thread/unthread right bra strap, Thread/unthread left bra strap, Hook/unhook bra (pull down sports bra)   Pull over shirt/dress - Perfomed by patient: Thread/unthread right sleeve, Put head through opening, Pull shirt over trunk, Thread/unthread left sleeve Pull over shirt/dress - Perfomed by helper: Thread/unthread left sleeve        Upper body assist Assist Level: Set up, More than reasonable time, Supervision or verbal cues   Set up : To obtain clothing/put away  Lower Body Dressing/Undressing Lower body dressing   What is the patient wearing?: Underwear, Pants, Ted Hose, Shoes, AFO Underwear - Performed by patient: Thread/unthread right underwear leg Underwear - Performed by helper: Thread/unthread left underwear leg,  Pull underwear up/down Pants- Performed by patient: Thread/unthread right pants leg, Pull pants up/down Pants- Performed by helper: Thread/unthread left pants leg   Non-skid slipper socks- Performed by helper: Don/doff right sock, Don/doff left  sock Socks - Performed by patient: Don/doff right sock Socks - Performed by helper: Don/doff left sock Shoes - Performed by patient: Don/doff right shoe, Fasten right Shoes - Performed by helper: Don/doff left shoe, Fasten left   AFO - Performed by helper: Don/doff left AFO   TED Hose - Performed by helper: Don/doff right TED hose, Don/doff left TED hose  Lower body assist Assist for lower body dressing: Touching or steadying assistance (Pt > 75%)      Toileting Toileting Toileting activity did not occur: No continent bowel/bladder event Toileting steps completed by patient: Performs perineal hygiene (in night gown) Toileting steps completed by helper: Adjust clothing prior to toileting, Performs perineal hygiene, Adjust clothing after toileting Toileting Assistive Devices: Grab bar or rail  Toileting assist Assist level: Two helpers   Transfers Chair/bed transfer   Chair/bed transfer method: Stand pivot Chair/bed transfer assist level: Touching or steadying assistance (Pt > 75%) Chair/bed transfer assistive device: Walker Photographer) Mechanical lift: Ecologist     Max distance: 15 Assist level: Maximal assist (Pt 25 - 49%)   Wheelchair   Type: Manual Max wheelchair distance: 75 Assist Level: Dependent (Pt equals 0%)  Cognition Comprehension Comprehension assist level: Follows complex conversation/direction with extra time/assistive device  Expression Expression assist level: Expresses complex ideas: With extra time/assistive device  Social Interaction Social Interaction assist level: Interacts appropriately with others with medication or extra time (anti-anxiety, antidepressant).  Problem Solving Problem solving assist level: Solves basic problems with no assist  Memory Memory assist level: More than reasonable amount of time    Medical Problem List and Plan: 1. 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 Sternal precautions after mitral valve repair  Continue CIR  PRAFO, hand splints  2. DVT Prophylaxis/Anticoagulation: Coumadin therapy after mitral valve repair  INR remains therapeutic today. Pharmacy protocol  3. Pain Management: Oxycodone as needed  -muscle rub for left shoulder pain,mild sublux  -pt using sling when up with therapy  -keep left arm elevated while in bed/chair 4. Mood/panic attacks: Lexapro 10 mg daily at bedtime, trazodone 100 mg daily at bedtime, Klonopin 0.5 mg daily at bedtime and 3 times a day as needed 5. Neuropsych: This patient is ?not fully capable of making decisions on her own behalf. 6. Skin/Wound Care: Routine skin checks 7. Fluids/Electrolytes/Nutrition: Routine I&O  8. Dysphagia. Dysphagia #3 thin liquids. Tolerating 9. PAF with RVR. Continue amiodarone, Aldactone 50 mg daily, Tenormin 12.5 mg twice a day. Appreciate Cards recs, signed off.  10. Acute on chronic diastolic congestive heart failure.   Lasix 80 mg daily, increased to 60 BID on 6/22 .  daily weights  Monitor for any signs of fluid overload, weights stable  Filed Weights   05/31/16 0425 06/01/16 0436 06/02/16 0515  Weight: 107 kg (235 lb 14.3 oz) 107.1 kg (236 lb 1.8 oz) 106.4 kg (234 lb 9.1 oz)  11. Diabetes mellitus with peripheral neuropathy. Check blood sugars before meals and at bedtime.   Levemir increased to 30u daily on 7/2, increased to 35U on 7/6, increased to 40U on 7/8, increased to 45U on 7/12  Overall Improving, Continue to monitor 12. Leukocytosis: Stable  Currently afebrile  WBCs 11.0 on 7/10  UA negative, U culture with multiple species  Chest x-ray from 6/21 showing interstitial edema and?Infiltrate/atelectasis (appears to have slightly improved from previous x-ray)  Repeat CXR ordered on 7/11 13. AKI: Resolved  Encourage fluid intake 14. Constipation: Improved 15. ABLA:  Resolved   Hb 12.9 on 7/10  Cont to monitor 16. HTN  Cont meds  Controlled for the most part 17. Spasticity   Started Baclofen 5mg  TID and increase as necessary  LOS (Days) 21 A FACE TO FACE EVALUATION WAS PERFORMED  Margaret [name] 06/02/2016 9:47 AM

## 2016-06-02 NOTE — Progress Notes (Signed)
Physical Therapy Session Note  Patient Details  Name: Margaret [name] MRN: [id number] Date of Birth: [birth year]  Today's Date: 06/02/2016 PT Individual Time: 0802-0909 PT Individual Time Calculation (min): 67 min   Short Term Goals: Week 2:  PT Short Term Goal 1 (Week 2): Pt will perform bed mobility with consistent mod A (currently mod A) PT Short Term Goal 1 - Progress (Week 2): Progressing toward goal PT Short Term Goal 2 (Week 2): Pt will perform bed <> chair transfers with mod A and maintain sternal precautions (max A +1) PT Short Term Goal 2 - Progress (Week 2): Progressing toward goal PT Short Term Goal 3 (Week 2): Pt will tolerate standing x 5-8 minutes at time for standing balance and gait training activities with mod A PT Short Term Goal 3 - Progress (Week 2): Met PT Short Term Goal 4 (Week 2): Pt will perform ambulation with appropriate AD x 15' with max A (max A +  w/c follow (+2 for safety)) PT Short Term Goal 4 - Progress (Week 2): Progressing toward goal  Skilled Therapeutic Interventions/Progress Updates:    Pt received asleep in bed but awakened with verbal cuing. Pt denied c/o pain & able to participate rolling in bed; pt able to roll L with supervision & use of bed rails and roll R with mod A for LLE placement and facilitation at shoulders for rolling. Pt able to transfer sidelying>sit with mod/max A. PT provided total A for donning shoes & L AFO with pt reporting her husband feels comfortable with donning shoe & brace. Session focused on transfers (sit<>stand, stand pivot and car). Pt able to complete all transfers with min A, with pt providing instruction to therapist on how to assist her to simulate her teaching husband/family, with use of hemiwalker. Pt with impaired proprioception in LLE thus requiring cuing for positioning of LLE with physical assistance on occassions. Pt required Min A to transfer LLE into car but able to transfer BLE out of car using UE's as needed.  During car transfer pt became nauseas and PT assisted pt with squat pivot car>w/c for time management. Pt transported back to room & left in room with all needs within reach & set up with breakfast tray; pt reported she was beginning to feel better.  PT educated pt to perform skin checks on LLE after wearing AFO & pt voiced understanding.  Therapy Documentation Precautions:  Precautions Precautions: Fall, Sternal Precaution Comments: L hemiparesis; L neglect; L subluxed shoulder Required Braces or Orthoses: Other Brace/Splint (left shoulder taped) Restrictions Weight Bearing Restrictions: Yes (sternal precatuions) LLE Weight Bearing: Weight bearing as tolerated Other Position/Activity Restrictions: sternal precautions  Pain: Pain Assessment Pain Assessment: No/denies pain Pain Score: 0-No pain   See Function Navigator for Current Functional Status.   Therapy/Group: Individual Therapy  [name] 06/02/2016, 7:44 AM

## 2016-06-03 ENCOUNTER — Inpatient Hospital Stay (HOSPITAL_COMMUNITY): Payer: Self-pay | Admitting: Occupational Therapy

## 2016-06-03 ENCOUNTER — Inpatient Hospital Stay (HOSPITAL_COMMUNITY): Payer: Medicaid Other | Admitting: Occupational Therapy

## 2016-06-03 ENCOUNTER — Inpatient Hospital Stay (HOSPITAL_COMMUNITY): Payer: Medicaid Other

## 2016-06-03 ENCOUNTER — Inpatient Hospital Stay (HOSPITAL_COMMUNITY): Payer: Self-pay | Admitting: Physical Therapy

## 2016-06-03 LAB — PROTIME-INR
INR: 2.69 — ABNORMAL HIGH (ref 0.00–1.49)
Prothrombin Time: 28.2 seconds — ABNORMAL HIGH (ref 11.6–15.2)

## 2016-06-03 LAB — GLUCOSE, CAPILLARY
GLUCOSE-CAPILLARY: 176 mg/dL — AB (ref 65–99)
GLUCOSE-CAPILLARY: 198 mg/dL — AB (ref 65–99)
Glucose-Capillary: 117 mg/dL — ABNORMAL HIGH (ref 65–99)
Glucose-Capillary: 135 mg/dL — ABNORMAL HIGH (ref 65–99)

## 2016-06-03 NOTE — Progress Notes (Signed)
ANTICOAGULATION CONSULT NOTE - Follow Up Consult  Pharmacy Consult for couamdin Indication: MVR and afib  Allergies  Allergen Reactions  . Codeine Hives, Itching and Other (See Comments)    "breathing problems"    Patient Measurements: Weight: 233 lb 14.5 oz (106.1 kg)  Vital Signs: Temp: 97.7 F (36.5 C) (07/12 0525) Temp Source: Oral (07/12 0525) BP: 155/63 mmHg (07/12 0525) Pulse Rate: 59 (07/12 0525)  Labs:  Recent Labs  06/01/16 0531 06/01/16 1236 06/02/16 0634 06/03/16 0708  HGB  --  12.9  --   --   HCT  --  43.5  --   --   PLT  --  519*  --   --   LABPROT 28.7*  --  27.7* 28.2*  INR 2.76*  --  2.62* 2.69*  CREATININE  --  0.91  --   --     Estimated Creatinine Clearance: 79.5 mL/min (by C-G formula based on Cr of 0.91).  Assessment: 60 yo female with hx of MVR and afib. Currently therapeutic on coumadin with INR today at 2.69. CBC stable on 7/10. No s/s bleeding.    Goal of Therapy:  INR 2.5-3.5 Monitor platelets by anticoagulation protocol: Yes   Plan:  Coumadin 5 mg po daily Daily INR Monitor CBC, s/sx bleeding  Maryanna Shape, PharmD, BCPS  Clinical Pharmacist  Pager: [phone]   06/03/2016,1:25 PM

## 2016-06-03 NOTE — Progress Notes (Signed)
Occupational Therapy Session Note  Patient Details  Name: [name] MRN: [id number] Date of Birth: [birth year]  Today's Date: 06/03/2016 OT Individual Time: 9983-3825 OT Individual Time Calculation (min): 60 min    Short Term Goals: Week 1:  OT Short Term Goal 1 (Week 1): Pt will be mod I with bed mobility with HOB down as a precursor for ADLs  OT Short Term Goal 1 - Progress (Week 1): Progressing toward goal OT Short Term Goal 2 (Week 1): Pt will complete UB bathing and dressing seated EOB unsupported with moderate assistance  OT Short Term Goal 2 - Progress (Week 1): Met OT Short Term Goal 3 (Week 1): Pt will perform toilet transfer with mod assist  OT Short Term Goal 3 - Progress (Week 1): Met OT Short Term Goal 4 (Week 1): Pt will independently verbalize and adhere to sternal precautions 100% of the time OT Short Term Goal 4 - Progress (Week 1): Met Week 2:  OT Short Term Goal 1 (Week 2): Pt will be educated on tub/shower transfers using tub transfer bench OT Short Term Goal 2 (Week 2): Pt will be min assist with stand pivot transfer w/c to/from [facility] OT Short Term Goal 3 (Week 2): Pt will be educated on a LUE HEP  OT Short Term Goal 4 (Week 2): Pt will perform sit to/from stands with min assist as a precursor for ADLs  Week 3:  OT Short Term Goal 1 (Week 3): LTG=STG OT Short Term Goal 2 (Week 3): Complete Family education with husband  Skilled Therapeutic Interventions/Progress Updates:    Pt seen for skilled OT to facilitate safe transfers w/c >< BSC with family education with pt's spouse, sister and her brother in law.  Completed stand pivot transfers with hemiwalker with 180 degree turn with demo from therapist facilitating and pt leading expressing each step she was going to take verbally. Brother in law worked on 43 degree turn to [facility] with hemi walker.  Education on how to facilitate movement of L foot if pt gets "stuck".  Pt became very fatigued and needed short rest  break.  Educated family on how to facilitate squat pivots if she becomes fatigued easily. These transfers will have to be utilized when she moves from toilet to shower bench.  Reviewed with family to only use BSC out of bathroom until they have worked with HHPT/HHOT on ambulating into bathroom.  Pt and family are feeling prepared for discharge and are looking forward to the afternoon shower planned today. Pt in room with family and all needs met.  Therapy Documentation Precautions:  Precautions Precautions: Fall, Sternal Precaution Comments: L hemiparesis; L neglect; L subluxed shoulder Required Braces or Orthoses: Other Brace/Splint (left shoulder taped) Restrictions Weight Bearing Restrictions: Yes (sternal precautions) LLE Weight Bearing: Weight bearing as tolerated Other Position/Activity Restrictions: sternal precautions     Pain: Pain Assessment Pain Assessment: No/denies pain Pain Score: 0-No pain ADL:   See Function Navigator for Current Functional Status.   Therapy/Group: Individual Therapy  SAGUIER,JULIA 06/03/2016, 12:58 PM

## 2016-06-03 NOTE — Progress Notes (Signed)
Physical Therapy Discharge Summary  Patient Details  Name: Margaret [name] MRN: [id number] Date of Birth: Jun 27, 1956  Today's Date: 06/03/2016 PT Individual Time: 0800-0908 PT Individual Time Calculation (min): 68 min    Patient has met 6 of 7 long term goals due to improved activity tolerance, improved balance, improved postural control, increased strength, increased range of motion, ability to compensate for deficits, functional use of  left upper extremity, improved attention, improved awareness and improved coordination.  Patient to discharge at a wheelchair level [place].   Patient's care partner is independent to provide the necessary physical and cognitive assistance at discharge.  Reasons goals not met: Decreased endurance and strength prevents safe ascention of stairs with Max A from PT.   Recommendation:  Patient will benefit from ongoing skilled PT services in home health setting to continue to advance safe functional mobility, address ongoing impairments in Balance, gait, strength, endurance, and minimize fall risk.  Equipment: HW, WC. AFO, Heel wedge.   Reasons for discharge: treatment goals met and discharge from hospital  Patient/family agrees with progress made and goals achieved: Yes   PT treatment:  PT performed grad day assessment: see below for results. PT educated patients husband in safe transfer to various surface heights as well as bed mobility with mod cues for positioning.  Pt requested to use toilet following bed mobility and performed toilet transfer with min A from PT. Husband assisted with perineal hygiene.  PT Also instructed Pt in Car transfer without AD and mod A  And with AD with min A. Mod cues for positioning and LE sequencing. Patient left sitting in [facility] with call bell within reach    PT Discharge Precautions/Restrictions Restrictions Weight Bearing Restrictions: Yes (sternal precautions) Pain Pain Assessment Pain Assessment: No/denies  pain Pain Score: 0-No pain Vision/Perception  Perception Comments: Mild L inatention due to poor sensation.   Cognition Orientation Level: Oriented X4 Memory: Appears intact Problem Solving: Appears intact Self Monitoring: Impaired Safety/Judgment: Appears intact Comments: Mild decrease in safety due to poor sensation on the LLE.  Sensation Sensation Light Touch: Appears Intact (LUE and LLE) Proprioception: Impaired by gross assessment (LUE and LLE) Coordination Gross Motor Movements are Fluid and Coordinated: No Fine Motor Movements are Fluid and Coordinated: No Coordination and Movement Description: LLE and LUE movement  impaired. R UE &  LE WFL.  Motor  Motor Motor: Hemiplegia Motor - Skilled Clinical Observations: L side hemiplegia.   Mobility Bed Mobility Bed Mobility: Rolling Right;Rolling Left;Supine to Sit;Sit to Supine Rolling Right: 4: Min assist Rolling Right Details: Verbal cues for sequencing;Manual facilitation for placement;Verbal cues for technique;Verbal cues for precautions/safety;Tactile cues for placement Rolling Left: 4: Min assist Rolling Left Details: Verbal cues for sequencing;Verbal cues for technique;Verbal cues for precautions/safety;Tactile cues for placement Supine to Sit: 3: Mod assist Supine to Sit Details: Verbal cues for sequencing;Verbal cues for technique;Verbal cues for precautions/safety Sit to Supine: 3: Mod assist Sit to Supine - Details: Verbal cues for sequencing;Verbal cues for technique;Verbal cues for precautions/safety Transfers Transfers: Yes Sit to Stand: 5: Supervision Sit to Stand Details: Verbal cues for technique Stand to Sit: 5: Supervision Stand to Sit Details (indicate cue type and reason): Verbal cues for technique;Verbal cues for precautions/safety Stand Pivot Transfers: 4: Min assist Stand Pivot Transfer Details: Verbal cues for gait pattern;Verbal cues for safe use of DME/AE;Verbal cues for precautions/safety;Verbal  cues for technique Locomotion  Ambulation Ambulation/Gait Assistance: 3: Mod assist;2: Max assist Ambulation Distance (Feet): 30 Feet Assistive device:  Hemi-walker Ambulation/Gait Assistance Details: Manual facilitation for placement;Verbal cues for sequencing;Verbal cues for technique;Verbal cues for precautions/safety;Verbal cues for safe use of DME/AE Gait Gait: Yes Gait Pattern: Impaired Gait Pattern: Step-to pattern;Decreased stance time - left;Decreased weight shift to left;Left genu recurvatum;Narrow base of support Stairs / Additional Locomotion Stairs: No (fatigue prevented stair assessment. ) Wheelchair Mobility Wheelchair Mobility: No (Sternal precautions. )  Trunk/Postural Assessment  Cervical Assessment Cervical Assessment: Within Functional Limits Thoracic Assessment Thoracic Assessment: Exceptions to WFL (kyphosis) Lumbar Assessment Lumbar Assessment: Exceptions to WFL (flat back posture) Postural Control Postural Control: Deficits on evaluation  Balance Static balance: Mod I  Dymanic sitting balance: Mod I Static standing: Stand by assist Dynamic standing: min A.      Extremity Assessment     RLE: WFL.   LLE.  AROM: near full knee extension against gravity. lacking 50% hip flexion in seated position  Strength: grossly 3-/5 throughout LLE noted through functional movement.         See Function Navigator for Current Functional Status.  Lorie Phenix 06/03/2016, 12:14 PM

## 2016-06-03 NOTE — Progress Notes (Signed)
Occupational Therapy Discharge Summary  Patient Details  Name: Margaret [name] MRN: [id number] Date of Birth: 05/30/1956   1:1 Completed bathing at shower level in the ADL apartment with focus on tub bench transfer with min A with extra time for pt to process and recall steps.  Reviewed home setup and recommendations to only shower after home health has come and practiced ambulation into the bathroom. Review and return demonstration of  Process for transfers and setup of bathing and dressing routine to ensure safety and energy conservation. Pt able to bathe more of LB sitting on tub bench with success. Pt able to recall hemi techniques without cuing just extra time. Pt performed squat pivot transfer from w/c to tub bench simulating transfer toilet to tub bench (sequence for home).  Pt returned to w/c to dress sit to stand. Demonstrated energy conservation by threading brief, pants and shoes before standing to finishing drying and pulling up pants. Pt able to self propel self back to her room with only one rest break demonstrating increased activity tolerance.  Discharge Patient has met 10 of 10  long term goals due to improved activity tolerance, improved balance, postural control and ability to compensate for deficits.  Patient to discharge at [facility] Assist level.  Patient's care partner is independent to provide the necessary physical assistance at discharge.    Reasons goals not met: n/a  Recommendation:  Patient will benefit from ongoing skilled OT services in home health setting to continue to advance functional skills in the area of BADL.  Equipment: transfer tub bench, BSC (wide drop arm)  Reasons for discharge: treatment goals met  Patient/family agrees with progress made and goals achieved: Yes  OT Discharge Precautions/Restrictions  Precautions Precautions: Fall;Sternal Precaution Comments: painful L shoulder Restrictions Weight Bearing Restrictions: Yes (sternal  precautions) Other Position/Activity Restrictions: sternal precautions ADL ADL ADL Comments: min - mod A overall Vision/Perception  Vision- History Baseline Vision/History: Wears glasses Wears Glasses: Reading only Patient Visual Report: Other (comment) (Occassional Floaters reported ) Vision- Assessment Vision Assessment?: No apparent visual deficits Perception Comments: Mild L inatention due to poor sensation.   Cognition Overall Cognitive Status: Within Functional Limits for tasks assessed Orientation Level: Oriented X4 Memory: Appears intact Problem Solving: Appears intact Self Monitoring: Impaired Safety/Judgment: Appears intact Comments: Mild decrease in safety due to poor sensation on the LLE.  Sensation Sensation Light Touch: Appears Intact Stereognosis: Appears Intact Hot/Cold: Appears Intact Proprioception: Impaired by gross assessment Coordination Gross Motor Movements are Fluid and Coordinated: No Fine Motor Movements are Fluid and Coordinated: No Coordination and Movement Description: LLE and LUE movement  impaired. R UE &  LE WFL.  Motor  Motor Motor: Hemiplegia Motor - Skilled Clinical Observations: L side hemiplegia.  Mobility  Bed Mobility Bed Mobility: Rolling Right;Rolling Left;Supine to Sit;Sit to Supine Rolling Right: 4: Min assist Rolling Right Details: Verbal cues for sequencing;Manual facilitation for placement;Verbal cues for technique;Verbal cues for precautions/safety;Tactile cues for placement Rolling Left: 4: Min assist Rolling Left Details: Verbal cues for sequencing;Verbal cues for technique;Verbal cues for precautions/safety;Tactile cues for placement Supine to Sit: 3: Mod assist Supine to Sit Details: Verbal cues for sequencing;Verbal cues for technique;Verbal cues for precautions/safety Sit to Supine: 3: Mod assist Sit to Supine - Details: Verbal cues for sequencing;Verbal cues for technique;Verbal cues for  precautions/safety Transfers Sit to Stand: 5: Supervision Sit to Stand Details: Verbal cues for technique Stand to Sit: 5: Supervision Stand to Sit Details (indicate cue type and reason): Verbal  cues for technique;Verbal cues for precautions/safety  Trunk/Postural Assessment  Cervical Assessment Cervical Assessment: Within Functional Limits Thoracic Assessment Thoracic Assessment: Exceptions to [facility] (kyphosis) Lumbar Assessment Lumbar Assessment: Exceptions to WFL (flat back posture) Postural Control Postural Control: Deficits on evaluation  Balance Static Sitting Balance Static Sitting - Level of Assistance: 6: Modified independent (Device/Increase time) Dynamic Sitting Balance Dynamic Sitting - Level of Assistance: 6: Modified independent (Device/Increase time) Static Standing Balance Static Standing - Balance Support: Right upper extremity supported Static Standing - Level of Assistance: 5: Stand by assistance Dynamic Standing Balance Dynamic Standing - Level of Assistance: 4: Min assist Extremity/Trunk Assessment RUE Assessment RUE Assessment: Within Functional Limits LUE AROM (degrees) LUE Overall AROM Comments: decreased AROM, pt with increased pain during attempts to flex shoulder LUE PROM (degrees) LUE Overall PROM Comments: decreased PROM, pt with increased pain when therapist attempting to passivley move shoulder "I have a bad shoulder" Sublux also noted.  LUE Strength LUE Overall Strength: Deficits (trace movement in shoulder)   See Function Navigator for Current Functional Status.  [place] 06/03/2016, 1:13 PM

## 2016-06-03 NOTE — Progress Notes (Signed)
[facility] PHYSICAL MEDICINE & REHABILITATION     PROGRESS NOTE  Subjective/Complaints:  Pt laying in bed asleep.  She take a little time to arouse.  She denise complaints.   ROS: Denies CP, SOB, nausea, vomiting, diarrhea.  Objective: Vital Signs: Blood pressure 155/63, pulse 59, temperature 97.7 F (36.5 C), temperature source Oral, resp. rate 18, weight 106.1 kg (233 lb 14.5 oz), SpO2 95 %. Dg Chest Port 1 View  06/02/2016  CLINICAL DATA:  Leukocytosis. EXAM: PORTABLE CHEST 1 VIEW COMPARISON:  05/13/2016 FINDINGS: Postsurgical changes from mitral valve repair are stable. Cardiomediastinal silhouette is enlarged, likely exaggerated by portable technique. There is no evidence of pleural effusion or pneumothorax. Left lower lobe airspace consolidation cannot be excluded. Osseous structures are without acute abnormality. Soft tissues are grossly normal. IMPRESSION: Enlarged cardiac silhouette, likely exaggerated by portable technique. Increased retrocardiac density in the left lower lobe, with which airspace consolidation cannot be excluded. Follow-up with PA and lateral radiograph of the chest may be considered. Electronically Signed   By: Fidela Salisbury M.D.   On: 06/02/2016 11:09    Recent Labs  06/01/16 1236  WBC 11.0*  HGB 12.9  HCT 43.5  PLT 519*    Recent Labs  06/01/16 1236  NA 135  K 4.1  CL 100*  GLUCOSE 163*  BUN 17  CREATININE 0.91  CALCIUM 10.8*   CBG (last 3)   Recent Labs  06/02/16 1626 06/02/16 2050 06/03/16 0636  GLUCAP 139* 151* 117*    Wt Readings from Last 3 Encounters:  06/03/16 106.1 kg (233 lb 14.5 oz)  05/12/16 105.008 kg (231 lb 8 oz)  04/14/16 117.935 kg (260 lb)    Physical Exam:  BP 155/63 mmHg  Pulse 59  Temp(Src) 97.7 F (36.5 C) (Oral)  Resp 18  Wt 106.1 kg (233 lb 14.5 oz)  SpO2 95% Constitutional: She appears well-developed. Obese.  HENT: Normocephalic and atraumatic.  Eyes: EOM are normal. Right eye exhibits no  discharge. Left eye exhibits no discharge.  Cardiovascular: Regular Rate and rhythm. Respiratory: No respiratory distress. Effort normal.  GI: Soft. Bowel sounds are normal. Non-tender.  No guarding. Musculoskeletal: She exhibits edema. No tenderness. Left shoulder with kineseotaping in place.  Neurological: She is alert. A cranial nerve deficit is present.  Left facial weakness  Dysarthria.  Memory fair. RUE/RLE: 5/5 proximal to distal LUE 0/5 proximal to distal.  LLE: 2/5 proximal to distal. mAS  1/4 Left elbow extension (improved)  1+/4 ankle plantar flexion (improved) Skin: Chest incision clean dry, healing. Vascular changes bilateral lower extremities  Psychiatric: She is slowed at times. Normal behavior.  Assessment/Plan: 1. 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 Physiatrist is providing close team supervision and 24 hour management of active medical problems listed below. Physiatrist and rehab team continue to assess barriers to discharge/monitor patient progress toward functional and medical goals.  Function:  Bathing Bathing position   Position: Shower  Bathing parts Body parts bathed by patient: Chest, Abdomen, Front perineal area, Buttocks, Right upper leg, Left upper leg, Right arm Body parts bathed by helper: Right lower leg, Left lower leg, Back  Bathing assist Assist Level: Touching or steadying assistance(Pt > 75%)      Upper Body Dressing/Undressing Upper body dressing   What is the patient wearing?: Pull over shirt/dress  Bra - Perfomed by patient: Thread/unthread right bra strap, Thread/unthread left bra strap, Hook/unhook bra (pull down sports bra)   Pull over shirt/dress - Perfomed by patient: Thread/unthread right sleeve, Put  head through opening, Pull shirt over trunk, Thread/unthread left sleeve Pull over shirt/dress - Perfomed by helper: Thread/unthread left sleeve        Upper body assist Assist Level: Set up, More than reasonable time, Supervision or verbal cues   Set up : To obtain clothing/put away  Lower Body Dressing/Undressing Lower body dressing   What is the patient wearing?: Underwear, Pants, Ted Hose, Shoes, AFO Underwear - Performed by patient: Thread/unthread right underwear leg Underwear - Performed by helper: Thread/unthread left underwear leg, Pull underwear up/down Pants- Performed by patient: Thread/unthread right pants leg, Pull pants up/down Pants- Performed by helper: Thread/unthread left pants leg   Non-skid slipper socks- Performed by helper: Don/doff right sock, Don/doff left sock Socks - Performed by patient: Don/doff right sock Socks - Performed by helper: Don/doff left sock Shoes - Performed by patient: Don/doff right shoe, Fasten right Shoes - Performed by helper: Don/doff left shoe, Fasten left   AFO - Performed by helper: Don/doff left AFO   TED Hose - Performed by helper: Don/doff right TED hose, Don/doff left TED hose  Lower body assist Assist for lower body dressing: Touching or steadying assistance (Pt > 75%)      Toileting Toileting Toileting activity did not occur: No continent bowel/bladder event Toileting steps completed by patient: Adjust clothing prior to toileting, Performs perineal hygiene Toileting steps completed by helper: Adjust clothing after toileting Toileting Assistive Devices: Grab bar or rail  Toileting assist Assist level: Touching or steadying assistance (Pt.75%)   Transfers Chair/bed transfer   Chair/bed transfer method: Stand pivot Chair/bed transfer assist level: Touching or steadying assistance (Pt > 75%) Chair/bed transfer assistive device: Walker Photographer) Mechanical lift: Ecologist     Max distance:  15 Assist level: Maximal assist (Pt 25 - 49%)   Wheelchair   Type: Manual Max wheelchair distance: 75 Assist Level: Dependent (Pt equals 0%)  Cognition Comprehension Comprehension assist level: Follows complex conversation/direction with extra time/assistive device  Expression Expression assist level: Expresses complex ideas: With extra time/assistive device  Social Interaction Social Interaction assist level: Interacts appropriately with others with medication or extra time (anti-anxiety, antidepressant).  Problem Solving Problem solving assist level: Solves basic problems with no assist  Memory Memory assist level: More than reasonable amount of time    Medical Problem List and Plan: 1. 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 Sternal precautions after mitral valve repair  Continue CIR  PRAFO, hand splints  2. DVT Prophylaxis/Anticoagulation: Coumadin therapy after mitral valve repair  INR remains therapeutic today. Pharmacy protocol  3. Pain Management: Oxycodone as needed  -muscle rub for left shoulder pain,mild sublux  -pt using sling when up with therapy  -keep left arm elevated while in bed/chair 4. Mood/panic attacks: Lexapro 10 mg daily at bedtime, trazodone 100 mg daily at bedtime, Klonopin 0.5 mg daily at bedtime and 3 times a day as needed 5. Neuropsych: This patient is ?not fully capable of making decisions on her own behalf. 6. Skin/Wound Care: Routine skin checks 7. Fluids/Electrolytes/Nutrition: Routine I&O  8. Dysphagia. Dysphagia #3 thin liquids. Tolerating 9. PAF with RVR. Continue amiodarone, Aldactone 50 mg daily, Tenormin 12.5 mg twice a day. Appreciate Cards recs, signed off.  10. Acute on chronic diastolic congestive heart failure.   Lasix 80 mg daily, increased to 60 BID on 6/22 .  daily weights  Monitor for any  signs of fluid overload, weights stable  Filed Weights   06/01/16 0436 06/02/16 0515 06/03/16 0525  Weight: 107.1 kg (236 lb 1.8 oz) 106.4 kg (234 lb 9.1 oz) 106.1 kg (233 lb 14.5 oz)  11. Diabetes mellitus with peripheral neuropathy. Check blood sugars before meals and at bedtime.   Levemir increased to 30u daily on 7/2, increased to 35U on 7/6, increased to 40U on 7/8, increased to 45U on 7/12  Overall Improving, Continue to monitor 12. Leukocytosis: Stable  Currently afebrile  WBCs 11.0 on 7/10  UA negative, U culture with multiple species  Chest x-ray from 6/21 showing interstitial edema and?Infiltrate/atelectasis (appears to have slightly improved from previous x-ray)  Repeat CXR ordered on 7/11 with ?consolidation, 2 view CXR ordered 13. AKI: Resolved  Encourage fluid intake 14. Constipation: Improved 15. ABLA: Resolved   Hb 12.9 on 7/10  Cont to monitor 16. HTN  Cont meds  Elevated in last 24 hours, will consider med if persistently elevated 17. Spasticity   Started Baclofen 5mg  TID and will increase as necessary  LOS (Days) 22 A FACE TO FACE EVALUATION WAS PERFORMED  Ankit Lorie Phenix 06/03/2016 9:05 AM

## 2016-06-03 NOTE — Plan of Care (Signed)
Problem: RH Stairs Goal: LTG Patient will ambulate up and down stairs w/assist (PT) LTG: Patient will ambulate up and down # of stairs with assistance (PT)  Outcome: Not Met (add Reason) Continued Decreased endurance prevents successful stair negotiation.

## 2016-06-03 NOTE — Discharge Instructions (Signed)
Inpatient Rehab Discharge Instructions  Media Discharge date and time: No discharge date for patient encounter.   Activities/Precautions/ Functional Status: Activity: Sternal precautions Diet: diabetic diet Wound Care: none needed Functional status:  ___ No restrictions     ___ Walk up steps independently ___ 24/7 supervision/assistance   ___ Walk up steps with assistance ___ Intermittent supervision/assistance  ___ Bathe/dress independently ___ Walk with walker     _x__ Bathe/dress with assistance ___ Walk Independently    ___ Shower independently ___ Walk with assistance    ___ Shower with assistance ___ No alcohol     ___ Return to work/school ________  Special Instructions: Home health nurse to check INR on 05/29/2016 results to [facility] Coumadin clinic [phone] fax number [phone] INR goal 2.5-3.5  COMMUNITY REFERRALS UPON DISCHARGE:    Home Health:   PT,OT, RN  [place]  [id number]   Date of last service:06/04/2016  Medical Equipment/Items Ordered:HOSPITAL BED, WHEELCHAIR, WIDE DROP-ARM BEDSIDE COMMODE, [place]   [phone] Other:PACE PROGRAM IN [facility] STARTED REFERRAL  GENERAL COMMUNITY RESOURCES FOR PATIENT/FAMILY: Support Groups:[facility] SUPPORT GROUP EVERY SECOND Thursday @ 3:00-4:00 PM ON THE REHAB UNIT QUESTIONS CONTACT KATIE [phone]  My questions have been answered and I understand these instructions. I will adhere to these goals and the provided educational materials after my discharge from the hospital.  Patient/Caregiver Signature _______________________________ Date __________  Clinician Signature _______________________________________ Date __________  Please bring this form and your medication list with you to all your follow-up doctor's appointments.

## 2016-06-03 NOTE — Discharge Summary (Signed)
Discharge summary job [phone]

## 2016-06-04 ENCOUNTER — Telehealth (HOSPITAL_COMMUNITY): Payer: Self-pay | Admitting: Physical Medicine and Rehabilitation

## 2016-06-04 LAB — PROTIME-INR
INR: 2.65 — ABNORMAL HIGH (ref 0.00–1.49)
Prothrombin Time: 27.9 seconds — ABNORMAL HIGH (ref 11.6–15.2)

## 2016-06-04 LAB — GLUCOSE, CAPILLARY: Glucose-Capillary: 126 mg/dL — ABNORMAL HIGH (ref 65–99)

## 2016-06-04 MED ORDER — OXYCODONE HCL 5 MG PO TABS: 5.0000 mg | ORAL_TABLET | ORAL | Status: DC | PRN

## 2016-06-04 MED ORDER — TRAZODONE HCL 100 MG PO TABS: 100.0000 mg | ORAL_TABLET | Freq: Every day | ORAL | Status: DC

## 2016-06-04 MED ORDER — FAMOTIDINE 20 MG PO TABS: 20.0000 mg | ORAL_TABLET | Freq: Two times a day (BID) | ORAL | Status: DC

## 2016-06-04 MED ORDER — CARVEDILOL 3.125 MG PO TABS: 3.1250 mg | ORAL_TABLET | Freq: Two times a day (BID) | ORAL | Status: DC

## 2016-06-04 MED ORDER — CLONAZEPAM 0.5 MG PO TABS: 0.5000 mg | ORAL_TABLET | Freq: Every day | ORAL | Status: DC

## 2016-06-04 MED ORDER — BACLOFEN 10 MG PO TABS: 5.0000 mg | ORAL_TABLET | Freq: Three times a day (TID) | ORAL | Status: DC

## 2016-06-04 MED ORDER — AMIODARONE HCL 200 MG PO TABS: 200.0000 mg | ORAL_TABLET | Freq: Every day | ORAL | Status: DC

## 2016-06-04 MED ORDER — POTASSIUM CHLORIDE 20 MEQ PO PACK: 20.0000 meq | PACK | Freq: Two times a day (BID) | ORAL | Status: DC

## 2016-06-04 MED ORDER — WARFARIN SODIUM 5 MG PO TABS: 5.0000 mg | ORAL_TABLET | Freq: Every day | ORAL | Status: DC

## 2016-06-04 MED ORDER — SPIRONOLACTONE 25 MG PO TABS: 50.0000 mg | ORAL_TABLET | Freq: Every day | ORAL | Status: DC

## 2016-06-04 MED ORDER — FUROSEMIDE 40 MG PO TABS: 40.0000 mg | ORAL_TABLET | Freq: Two times a day (BID) | ORAL | Status: DC

## 2016-06-04 MED ORDER — CLONAZEPAM 0.5 MG PO TABS: 0.2500 mg | ORAL_TABLET | Freq: Three times a day (TID) | ORAL | Status: DC | PRN

## 2016-06-04 MED ORDER — NYSTATIN 100000 UNIT/GM EX POWD: Freq: Two times a day (BID) | CUTANEOUS | Status: DC

## 2016-06-04 MED ORDER — INSULIN DETEMIR 100 UNIT/ML ~~LOC~~ SOLN: 45.0000 [IU] | Freq: Every day | SUBCUTANEOUS | Status: DC

## 2016-06-04 MED ORDER — ESCITALOPRAM OXALATE 10 MG PO TABS: 10.0000 mg | ORAL_TABLET | Freq: Every day | ORAL | Status: DC

## 2016-06-04 NOTE — Progress Notes (Signed)
Social Work  Discharge Note  The overall goal for the admission was met for:   Discharge location: [place]  Length of Stay: Yes-23 DAYS  Discharge activity level: Yes-MIN ASSIST WHEELCHAIR LEVEL  Home/community participation: Yes  Services provided included: MD, RD, PT, OT, SLP, RN, CM, TR, Pharmacy, Neuropsych and SW  Financial Services: Medicaid  Follow-up services arranged: Home Health: [facility], OT RN, DME: [facility]-HOSPITAL BED, WHEELCHAIR, HEMI-WALKER, DROP-ARM BEDSIDE COMMODE AND TUB BENCH and Patient/Family has no preference for HH/DME agencies  Comments (or additional information):HUSBAND AND FRIEND [place], ALL COMFORTABLE WITH HER CARE. REFERRAL MADE TO THE PACE PROGRAM WILL FOLLOW UP AT HOME. HUSBAND STILL WORKING ON RAMP FOR HOME.   Patient/Family verbalized understanding of follow-up arrangements: Yes  Individual responsible for coordination of the follow-up plan: SELF & BILLY-HUSBAND  Confirmed correct DME delivered: Elease Hashimoto 06/04/2016    Elease Hashimoto

## 2016-06-04 NOTE — Progress Notes (Signed)
[place] [facility] PHYSICAL MEDICINE & REHABILITATION     PROGRESS NOTE  Subjective/Complaints:  Pt sleeping in bed this AM.  She is easily arousable and ready for discharge.   ROS: Denies CP, SOB, nausea, vomiting, diarrhea.  Objective: Vital Signs: Blood pressure 141/60, pulse 62, temperature 98.3 F (36.8 C), temperature source Oral, resp. rate 17, weight 107.2 kg (236 lb 5.3 oz), SpO2 93 %. Dg Chest 2 View  06/03/2016  CLINICAL DATA:  Leukocytosis EXAM: CHEST  2 VIEW COMPARISON:  06/02/2016 FINDINGS: Status post median sternotomy. Heart is mildly enlarged. Stable prominence of the pulmonary artery segment. There are no focal consolidations or pleural effusions. No pulmonary edema. Mildly prominent interstitial marking are stable. Thoracic spondylosis. IMPRESSION: Stable cardiomegaly.  No focal acute pulmonary abnormality. Electronically Signed   By: Nolon Nations M.D.   On: 06/03/2016 12:37   Dg Chest Port 1 View  06/02/2016  CLINICAL DATA:  Leukocytosis. EXAM: PORTABLE CHEST 1 VIEW COMPARISON:  05/13/2016 FINDINGS: Postsurgical changes from mitral valve repair are stable. Cardiomediastinal silhouette is enlarged, likely exaggerated by portable technique. There is no evidence of pleural effusion or pneumothorax. Left lower lobe airspace consolidation cannot be excluded. Osseous structures are without acute abnormality. Soft tissues are grossly normal. IMPRESSION: Enlarged cardiac silhouette, likely exaggerated by portable technique. Increased retrocardiac density in the left lower lobe, with which airspace consolidation cannot be excluded. Follow-up with PA and lateral radiograph of the chest may be considered. Electronically Signed   By: Fidela Salisbury M.D.   On: 06/02/2016 11:09    Recent Labs  06/01/16 1236  WBC 11.0*  HGB 12.9  HCT 43.5  PLT 519*    Recent Labs  06/01/16 1236  NA 135  K 4.1  CL 100*  GLUCOSE 163*  BUN 17  CREATININE 0.91  CALCIUM 10.8*   CBG (last 3)    Recent Labs  06/03/16 1655 06/03/16 2058 06/04/16 0652  GLUCAP 135* 198* 126*    Wt Readings from Last 3 Encounters:  06/04/16 107.2 kg (236 lb 5.3 oz)  05/12/16 105.008 kg (231 lb 8 oz)  04/14/16 117.935 kg (260 lb)    Physical Exam:  BP 141/60 mmHg  Pulse 62  Temp(Src) 98.3 F (36.8 C) (Oral)  Resp 17  Wt 107.2 kg (236 lb 5.3 oz)  SpO2 93% Constitutional: She appears well-developed. Obese.  HENT: Normocephalic and atraumatic.  Eyes: EOM are normal. Right eye exhibits no discharge. Left eye exhibits no discharge.  Cardiovascular: Regular Rate and rhythm. Respiratory: No respiratory distress. Effort normal.  GI: Soft. Bowel sounds are normal. Non-tender.  No guarding. Musculoskeletal: She exhibits edema. No tenderness. Left shoulder with kineseotaping in place.  Neurological: She is alert. A cranial nerve deficit is present.  Left facial weakness  Dysarthria.  Memory fair. RUE/RLE: 5/5 proximal to distal LUE 0/5 proximal to distal.  LLE: 2/5 proximal to distal. mAS  1/4 Left elbow extension (improved)  1/4 ankle plantar flexion (improved) Skin: Chest incision clean dry, healing. Vascular changes bilateral lower extremities  Psychiatric: She is slowed at times. Normal behavior.  Assessment/Plan: 1. 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 Physiatrist is providing close team supervision and 24 hour management of active medical problems listed below. Physiatrist and rehab team continue to assess barriers to discharge/monitor patient progress toward functional and medical  goals.  Function:  Bathing Bathing position   Position: Shower  Bathing parts Body parts bathed by patient: Chest, Abdomen, Front perineal area, Buttocks, Right upper  leg, Left upper leg, Right arm, Right lower leg, Left lower leg Body parts bathed by helper: Left arm, Back  Bathing assist Assist Level: Touching or steadying assistance(Pt > 75%)      Upper Body Dressing/Undressing Upper body dressing   What is the patient wearing?: Pull over shirt/dress Bra - Perfomed by patient: Thread/unthread right bra strap, Thread/unthread left bra strap, Hook/unhook bra (pull down sports bra)   Pull over shirt/dress - Perfomed by patient: Thread/unthread right sleeve, Put head through opening, Pull shirt over trunk, Thread/unthread left sleeve Pull over shirt/dress - Perfomed by helper: Thread/unthread left sleeve        Upper body assist Assist Level: Set up, More than reasonable time, Supervision or verbal cues   Set up : To obtain clothing/put away  Lower Body Dressing/Undressing Lower body dressing   What is the patient wearing?: Pants, Ted Hose, AFO, Shoes Underwear - Performed by patient: Thread/unthread right underwear leg Underwear - Performed by helper: Thread/unthread left underwear leg, Pull underwear up/down Pants- Performed by patient: Thread/unthread right pants leg, Thread/unthread left pants leg Pants- Performed by helper: Pull pants up/down   Non-skid slipper socks- Performed by helper: Don/doff right sock, Don/doff left sock Socks - Performed by patient: Don/doff right sock Socks - Performed by helper: Don/doff left sock Shoes - Performed by patient: Don/doff right shoe, Fasten right Shoes - Performed by helper: Don/doff left shoe, Fasten left   AFO - Performed by helper: Don/doff right AFO   TED Hose - Performed by helper: Don/doff right TED hose, Don/doff left TED hose  Lower body assist Assist for lower body dressing: Touching or steadying assistance (Pt > 75%)      Toileting Toileting Toileting activity did not occur: No continent bowel/bladder event Toileting steps completed by patient: Adjust clothing prior to toileting,  Performs perineal hygiene Toileting steps completed by helper: Adjust clothing after toileting Toileting Assistive Devices: Grab bar or rail  Toileting assist Assist level: Touching or steadying assistance (Pt.75%)   Transfers Chair/bed transfer   Chair/bed transfer method: Stand pivot Chair/bed transfer assist level: Touching or steadying assistance (Pt > 75%) Chair/bed transfer assistive device: Armrests, Walker Mechanical lift: Ecologist     Max distance: 55ft Assist level: Moderate assist (Pt 50 - 74%)   Wheelchair Wheelchair activity did not occur: Safety/medical concerns (Sternal precautions. ) Type: Manual Max wheelchair distance: 75 Assist Level: Dependent (Pt equals 0%)  Cognition Comprehension Comprehension assist level: Follows complex conversation/direction with extra time/assistive device  Expression Expression assist level: Expresses complex ideas: With extra time/assistive device  Social Interaction Social Interaction assist level: Interacts appropriately with others with medication or extra time (anti-anxiety, antidepressant).  Problem Solving Problem solving assist level: Solves basic problems with no assist  Memory Memory assist level: More than reasonable amount of time    Medical Problem List and Plan: 1. 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 Sternal precautions after mitral valve repair  D/c today  PRAFO, hand splints  Will see pt for transitional care management in 1-2 weeks. 2. DVT Prophylaxis/Anticoagulation: Coumadin therapy after mitral valve repair  INR remains therapeutic today. Pharmacy protocol  3. Pain Management: Oxycodone as needed  -muscle rub for left shoulder pain,mild sublux  -pt  using sling when up with therapy  -keep left arm elevated while in bed/chair 4. Mood/panic attacks: Lexapro  10 mg daily at bedtime, trazodone 100 mg daily at bedtime, Klonopin 0.5 mg daily at bedtime and 3 times a day as needed 5. Neuropsych: This patient is ?not fully capable of making decisions on her own behalf. 6. Skin/Wound Care: Routine skin checks 7. Fluids/Electrolytes/Nutrition: Routine I&O  8. Dysphagia. Dysphagia #3 thin liquids. Tolerating 9. PAF with RVR. Continue amiodarone, Aldactone 50 mg daily, Tenormin 12.5 mg twice a day. Appreciate Cards recs, signed off.  10. Acute on chronic diastolic congestive heart failure.   Lasix 80 mg daily, increased to 60 BID on 6/22 .  daily weights  Monitor for any signs of fluid overload, weights stable  Filed Weights   06/02/16 0515 06/03/16 0525 06/04/16 0608  Weight: 106.4 kg (234 lb 9.1 oz) 106.1 kg (233 lb 14.5 oz) 107.2 kg (236 lb 5.3 oz)  11. Diabetes mellitus with peripheral neuropathy. Check blood sugars before meals and at bedtime.   Levemir increased to 30u daily on 7/2, increased to 35U on 7/6, increased to 40U on 7/8, increased to 45U on 7/12  Overall Improving, Continue to monitor 12. Leukocytosis: Stable  Currently afebrile  WBCs 11.0 on 7/10  UA negative, U culture with multiple species  Chest x-ray from 6/21 showing interstitial edema and?Infiltrate/atelectasis (appears to have slightly improved from previous x-ray)  Repeat CXR ordered on 7/11 with ?consolidation, 2 view CXR ordered 13. AKI: Resolved  Encourage fluid intake 14. Constipation: Improved 15. ABLA: Resolved   Hb 12.9 on 7/10  Cont to monitor 16. HTN  Cont meds  Overall controlled 17. Spasticity   Started Baclofen 5mg  TID and will increase as necessary  LOS (Days) 23 A FACE TO FACE EVALUATION WAS PERFORMED  Ankit Lorie Phenix 06/04/2016 10:49 AM

## 2016-06-04 NOTE — Progress Notes (Signed)
Patient and husband received discharge instructions from Silvestre Mesi, PA-C with verbal understanding. Patient discharged to home via EMS. Husband taking patient belongings to home.

## 2016-06-04 NOTE — Discharge Summary (Signed)
Margaret [name], Margaret [name]          ACCOUNT NO.:  [id number]  MEDICAL RECORD NO.:  [id number]  LOCATION:  [id number]                        FACILITY:  [facility]  PHYSICIAN:  Delice Lesch, MD        DATE OF BIRTH:  02/03/1956  DATE OF ADMISSION:  05/12/2016 DATE OF DISCHARGE:  06/04/2016                              DISCHARGE SUMMARY   DISCHARGE DIAGNOSES: 1. Debilitation secondary to mitral valve repair as well as recent     subarachnoid hemorrhage. 2. Coumadin after mitral valve repair. 3. Pain management. 4. Mood. 5. Dysphagia. 6. Paroxysmal atrial fibrillation with rapid ventricular response. 7. Acute-on-chronic diastolic congestive heart failure. 8. Diabetes mellitus, peripheral neuropathy. 9. Acute renal injury. 10.Constipation. 11.Acute blood loss anemia. 12.Hypertension.  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  At nursing facility, the patient remained essentially bed-bound.  Prior to nursing home placement, lives with husband.  Presented on April 23, 2016, for planned mitral valve surgery and underwent mitral valve repair on April 30, 2016, per Dr. Roxy Manns.  Sternal precautions.  Hospital course, pain management.  Runs of PAF with RVR, placed on amiodarone, follow up Cardiology Services.  Dysphagia mechanical soft diet.  TPN for nutritional support.  Placed on Coumadin therapy after mitral valve repair.  Acute-on-chronic blood loss anemia, hemoglobin 10.4.  The patient with fluid overload, maintained on Lasix.  Physical and occupational therapy ongoing.   The patient was admitted for a comprehensive rehab program.  PAST MEDICAL HISTORY:  See discharge diagnoses.  SOCIAL HISTORY:  Lives with husband.  FUNCTIONAL STATUS:  Upon admission to [facility], was +2 physical assist stand pivot transfers, +2 physical assist sit-to-supine, max total assist activities of daily living.  PHYSICAL EXAMINATION:  VITAL SIGNS:  Blood pressure 119/74, pulse 63, temperature 98, respirations 21. GENERAL:  This was an alert female, severe dysarthria, flat affect. LUNGS:  Clear to auscultation without wheeze. CARDIAC:  Rate controlled. ABDOMEN:  Soft, nontender.  Good bowel sounds. NEUROLOGIC:  She followed fair commands, reasonable insight and awareness of deficits.  Memory was fair.  REHABILITATION HOSPITAL COURSE:  The patient was admitted to Inpatient Rehab Services with therapies initiated on a 3-hour daily basis, consisting of physical therapy, occupational therapy, speech therapy and rehabilitation nursing.  The following issues were addressed during the patient's rehabilitation stay.  Pertaining to Ms. Dais's debilitation secondary to mitral valve repair as well as history of subarachnoid hemorrhage with left-sided residual weakness, noted dysarthria.  She was able to communicate her needs.  Her diet had been advanced to regular.  Noted PRAFO boot and hand splints for spasticity. She remained on Coumadin after mitral valve repair, to be followed by Cardiology Services, [phone], fax number [phone].  She had no bleeding episodes, latest INR of 2.62.  She remained on Lexapro, trazodone, Klonopin for history of depression, panic attacks, emotional support provided.  Heart rate remained  controlled.  No chest pain or shortness of breath.  She remained on amiodarone 200 mg daily as well as Aldactone.  Acute-on-chronic diastolic congestive heart failure.  She exhibited no signs of fluid overload.  She continued on Lasix 40 mg twice daily.   Diabetes mellitus, peripheral neuropathy.  Insulin therapy was directed, full diabetic teaching.  Noted spasticity after noted history of subarachnoid hemorrhage with left-sided residual weakness. Maintained on baclofen 5 mg three times daily.  The patient received weekly collaborative interdisciplinary team conferences to discuss estimated length of stay, family teaching, any barriers to her discharge.  She participated with therapies with some encouragement. Able to roll to the left with supervision and use of a bed rail.  Able to transfer side-lying to sitting with mod-max assist.  Total assist for donning her shoes and left AFO brace.  Sessions focused on transfers, sit to stand, stand pivot to a car.  The patient able to complete all transfers with minimal assistance.  The patient with noted impaired proprioception of left lower extremity, requiring cuing for positioning of left lower extremity.  Required minimal assist to transfer left lower extremity to car.  Activities of daily living and homemaking.  Completed toilet transfers with a hemi-walker and minimal assistance for steadying assist and guidance of left lower extremity.  Completed toileting with max assist for clothing management after toileting.  The patient was able to doff her pants and complete peri-care while standing with steadying assist.  Completed two additional functional, toilet transfers minimal assist while turning with hemi-walker.  Full family teaching was completed and plan discharged to home.  DISCHARGE MEDICATIONS: 1. Amiodarone 200 mg p.o. daily. 2. Aspirin 81 mg p.o. daily. 3. Baclofen 5 mg p.o. t.i.d. 4. Coreg 3.125 mg p.o. b.i.d. 5. Klonopin 0.5 mg p.o. bedtime and 0.5 mg 3 times daily as needed. 6. Lexapro 10 mg p.o. at bedtime. 7. Pepcid 20 mg p.o. b.i.d. 8. Lasix 40 mg p.o. b.i.d. 9. Levemir insulin 45 units daily. 10.Oxycodone immediate release 5-10 mg every 3 hours as needed  pain. 11.Senokot-S tablets 2 p.o. b.i.d. hold for loose stool. 12.Aldactone 50 mg p.o. daily. 13.Desyrel 100 mg p.o. at bedtime. 14.Coumadin 5 mg daily, adjusted accordingly for INR 2.5-3.5.  DIET:  Diabetic diet.  FOLLOWUP:  The patient would follow up with Dr. Delice Lesch at the Outpatient Rehab Service Office as directed; Dr. Darylene Price, 2 weeks call for appointment; Richardson Dopp, physician assistant for Cardiology Services, call for appointment; Dr. York Ram, medical management.  Home health nurse had been arranged for weekly prothrombin times while on Coumadin therapy after mitral valve repair, results to [facility], [phone], fax number [phone].     Lauraine Rinne, P.A.   ______________________________ Delice Lesch, MD    DA/MEDQ  D:  06/03/2016  T:  06/04/2016  Job:  [id number]  cc:   Valentina Gu. Roxy Manns, M.D. Richardson Dopp, PA-C York Ram, MD

## 2016-06-04 NOTE — Telephone Encounter (Signed)
Pharmacy called regarding issues with Rx. Called in Ultram 50 mg # 35--use one every 4-6 hours prn moderate to severe pain.  Called in Klonopin 0.5 mg #30 pills--take one at bedtime prn.

## 2016-06-05 ENCOUNTER — Ambulatory Visit: Payer: Self-pay | Admitting: Neurology

## 2016-06-05 ENCOUNTER — Other Ambulatory Visit: Payer: Self-pay | Admitting: Thoracic Surgery (Cardiothoracic Vascular Surgery)

## 2016-06-05 DIAGNOSIS — Z9889 Other specified postprocedural states: Secondary | ICD-10-CM

## 2016-06-08 ENCOUNTER — Ambulatory Visit: Payer: Self-pay | Admitting: Thoracic Surgery (Cardiothoracic Vascular Surgery)

## 2016-06-09 ENCOUNTER — Telehealth: Payer: Self-pay | Admitting: Physical Medicine & Rehabilitation

## 2016-06-09 ENCOUNTER — Telehealth: Payer: Self-pay

## 2016-06-09 NOTE — Telephone Encounter (Signed)
Left message with pt to make aware that a hospital follow up appointment was made for 06/12/16 @ 11:20. Provided the address and phone number within the message. Advised pt to call back if appointment needs to be moved.

## 2016-06-09 NOTE — Telephone Encounter (Signed)
Holly RN with [facility] has questions about a couple of medications.  Please call her at [phone].

## 2016-06-10 ENCOUNTER — Emergency Department (HOSPITAL_COMMUNITY)
Admission: EM | Admit: 2016-06-10 | Discharge: 2016-06-10 | Disposition: A | Discharge status: Home / Self Care | Payer: Medicaid Other | Attending: Emergency Medicine | Admitting: Emergency Medicine

## 2016-06-10 ENCOUNTER — Emergency Department (HOSPITAL_COMMUNITY): Payer: Medicaid Other

## 2016-06-10 ENCOUNTER — Encounter (HOSPITAL_COMMUNITY): Payer: Self-pay | Admitting: Emergency Medicine

## 2016-06-10 DIAGNOSIS — Z794 Long term (current) use of insulin: Secondary | ICD-10-CM | POA: Insufficient documentation

## 2016-06-10 DIAGNOSIS — J449 Chronic obstructive pulmonary disease, unspecified: Secondary | ICD-10-CM | POA: Diagnosis not present

## 2016-06-10 DIAGNOSIS — L03116 Cellulitis of left lower limb: Secondary | ICD-10-CM | POA: Diagnosis not present

## 2016-06-10 DIAGNOSIS — R0789 Other chest pain: Secondary | ICD-10-CM

## 2016-06-10 DIAGNOSIS — Z7901 Long term (current) use of anticoagulants: Secondary | ICD-10-CM | POA: Diagnosis not present

## 2016-06-10 DIAGNOSIS — N390 Urinary tract infection, site not specified: Secondary | ICD-10-CM

## 2016-06-10 DIAGNOSIS — Z87891 Personal history of nicotine dependence: Secondary | ICD-10-CM | POA: Diagnosis not present

## 2016-06-10 DIAGNOSIS — I5032 Chronic diastolic (congestive) heart failure: Secondary | ICD-10-CM | POA: Insufficient documentation

## 2016-06-10 DIAGNOSIS — M542 Cervicalgia: Secondary | ICD-10-CM

## 2016-06-10 DIAGNOSIS — Z7982 Long term (current) use of aspirin: Secondary | ICD-10-CM | POA: Insufficient documentation

## 2016-06-10 DIAGNOSIS — Z8673 Personal history of transient ischemic attack (TIA), and cerebral infarction without residual deficits: Secondary | ICD-10-CM | POA: Diagnosis not present

## 2016-06-10 DIAGNOSIS — I110 Hypertensive heart disease with heart failure: Secondary | ICD-10-CM | POA: Insufficient documentation

## 2016-06-10 DIAGNOSIS — E119 Type 2 diabetes mellitus without complications: Secondary | ICD-10-CM | POA: Insufficient documentation

## 2016-06-10 DIAGNOSIS — R079 Chest pain, unspecified: Secondary | ICD-10-CM

## 2016-06-10 DIAGNOSIS — R791 Abnormal coagulation profile: Secondary | ICD-10-CM

## 2016-06-10 LAB — URINALYSIS, ROUTINE W REFLEX MICROSCOPIC
BILIRUBIN URINE: NEGATIVE
Glucose, UA: NEGATIVE mg/dL
HGB URINE DIPSTICK: NEGATIVE
KETONES UR: NEGATIVE mg/dL
Leukocytes, UA: NEGATIVE
NITRITE: POSITIVE — AB
PROTEIN: NEGATIVE mg/dL
Specific Gravity, Urine: 1.015 (ref 1.005–1.030)
pH: 5.5 (ref 5.0–8.0)

## 2016-06-10 LAB — BASIC METABOLIC PANEL
Anion gap: 7 (ref 5–15)
BUN: 16 mg/dL (ref 6–20)
CO2: 26 mmol/L (ref 22–32)
Calcium: 10.8 mg/dL — ABNORMAL HIGH (ref 8.9–10.3)
Chloride: 102 mmol/L (ref 101–111)
Creatinine, Ser: 0.97 mg/dL (ref 0.44–1.00)
Glucose, Bld: 109 mg/dL — ABNORMAL HIGH (ref 65–99)
POTASSIUM: 4.1 mmol/L (ref 3.5–5.1)
SODIUM: 135 mmol/L (ref 135–145)

## 2016-06-10 LAB — CBC WITH DIFFERENTIAL/PLATELET
BASOS ABS: 0.1 10*3/uL (ref 0.0–0.1)
Basophils Relative: 1 %
EOS ABS: 0.3 10*3/uL (ref 0.0–0.7)
EOS PCT: 2 %
HCT: 46.4 % — ABNORMAL HIGH (ref 36.0–46.0)
Hemoglobin: 14 g/dL (ref 12.0–15.0)
LYMPHS PCT: 18 %
Lymphs Abs: 2.6 10*3/uL (ref 0.7–4.0)
MCH: 26.3 pg (ref 26.0–34.0)
MCHC: 30.2 g/dL (ref 30.0–36.0)
MCV: 87.2 fL (ref 78.0–100.0)
Monocytes Absolute: 1.2 10*3/uL — ABNORMAL HIGH (ref 0.1–1.0)
Monocytes Relative: 9 %
NEUTROS PCT: 70 %
Neutro Abs: 9.8 10*3/uL — ABNORMAL HIGH (ref 1.7–7.7)
PLATELETS: 545 10*3/uL — AB (ref 150–400)
RBC: 5.32 MIL/uL — AB (ref 3.87–5.11)
RDW: 17.2 % — AB (ref 11.5–15.5)
WBC: 13.9 10*3/uL — AB (ref 4.0–10.5)

## 2016-06-10 LAB — URINE MICROSCOPIC-ADD ON: RBC / HPF: NONE SEEN RBC/hpf (ref 0–5)

## 2016-06-10 LAB — PROTIME-INR
INR: 1.61 — AB (ref 0.00–1.49)
Prothrombin Time: 19.2 seconds — ABNORMAL HIGH (ref 11.6–15.2)

## 2016-06-10 MED ORDER — ACETAMINOPHEN 325 MG PO TABS
650.0000 mg | ORAL_TABLET | Freq: Once | ORAL | Status: AC
  Administered 2016-06-10: 650 mg via ORAL
  Filled 2016-06-10: qty 2

## 2016-06-10 MED ORDER — CEPHALEXIN 500 MG PO CAPS: 500.0000 mg | ORAL_CAPSULE | Freq: Two times a day (BID) | ORAL | Status: DC

## 2016-06-10 MED ORDER — CEPHALEXIN 500 MG PO CAPS: 500.0000 mg | ORAL_CAPSULE | Freq: Four times a day (QID) | ORAL | Status: DC

## 2016-06-10 NOTE — Telephone Encounter (Signed)
Informed Nurse regarding Dr. Ena Dawley advice

## 2016-06-10 NOTE — ED Notes (Signed)
Pt statesshe understands instructions. 

## 2016-06-10 NOTE — ED Notes (Addendum)
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  Last night developed right neck pain which she feels may be related to sling.  Has been wearing it approx one month, placed by PT here when she had open heart in June 2017.  Also c/o RUQ abdominal pain that increases with deep inspiration.

## 2016-06-10 NOTE — ED Notes (Signed)
Pt placed on bedpan. Pt does some assist.

## 2016-06-10 NOTE — Telephone Encounter (Signed)
She has had a long hospital/SNF/hospital/IRF course.  I would recommend her continue on medications she was discharged with and she can follow up with her PCP regarding making changes to her chronic DM, HLD, BP meds if they wish.  Thanks.

## 2016-06-10 NOTE — Telephone Encounter (Signed)
I called the nurse back, she was asking for clarification on some medication.  Patient was previously taking Novalin70/30, atenolol 25 mg tab, and andalogliptin metformin 10000 mg. Medication was discontinued at hospital discharge.  I read the hospital discharge note and the above medications are not listed. There is no mention as to why they were discontinued.  Should they be reinstated?

## 2016-06-10 NOTE — ED Provider Notes (Signed)
CSN: [id number]     Arrival date & time 06/10/16  0802 History   First MD Initiated Contact with Patient 06/10/16 [phone]     Chief Complaint  Patient presents with  . Neck Pain     (Consider location/radiation/quality/duration/timing/severity/associated sxs/prior Treatment) The history is provided by the patient and medical records.     Pt with hx HTN, HLD, COPD, DM, stroke, CHF, chronically bedbound p/w right neck, right chest, right upper back pain that began yesterday.  The pain is constant, it is worse with palpation, movement, and deep inspiration.  Pt has been wearing a sling for her left arm since stroke left her with residual weakness in the arm. The strap wraps around her neck in nearly the same pattern that she now has pain and she therefore took it off.  She came to ED because she concerned that the pain represented a new stroke.   Pt denies fevers, SOB, cough, abdominal pain.  Does note urinary frequency.    Past Medical History  Diagnosis Date  . Hypertension   . Hyperlipidemia   . COPD (chronic obstructive pulmonary disease) ([place]) 04/20/12  . Asthma   . Type II diabetes mellitus ([place])   . Anemia   . H/O hiatal hernia   . Osteoarthritis   . Pneumonia 04/2012  . Migraines   . Panic attacks 04/20/12  . Morbid obesity ([place])   . Heart murmur   . Bacterial endocarditis     Strep viridans   . Chronic diastolic CHF (congestive heart failure) ([place]) 04/27/2012  . Pulmonary hypertension assoc with unclear multi-factorial mechanisms ([place]) 01/23/2016  . Positive ANA (antinuclear antibody)   . 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 Stroke ([place])   . Subarachnoid bleed ([place]) 02/01/2016  . S/P mitral valve repair 04/30/2016    Complex valvuloplasty including autologous pericardial patch repair of perforated posterior leaflet, artificial Gore-tex neochord placement x6 and 26 mm Sorin Memo 3D ring annuloplasty   Past Surgical History  Procedure Laterality Date  . Laceration repair  1966    "right  upper arm"  . Dilation and curettage of uterus  1990's  . Finger reattachment  1973    "right ring finger"  . Tee without cardioversion N/A 02/05/2016    Procedure: TRANSESOPHAGEAL ECHOCARDIOGRAM (TEE);  Surgeon: Larey Dresser, MD;  Location: [place];  Service: Cardiovascular;  Laterality: N/A;  . Radiology with anesthesia N/A 02/06/2016    Procedure: RADIOLOGY WITH ANESTHESIA;  Surgeon: Luanne Bras, MD;  Location: [place];  Service: Radiology;  Laterality: N/A;  . Cardiac catheterization N/A 04/23/2016    Procedure: Right/Left Heart Cath and Coronary Angiography;  Surgeon: Sherren Mocha, MD;  Location: [facility];  Service: Cardiovascular;  Laterality: N/A;  . Mitral valve repair N/A 04/30/2016    Procedure: MITRAL VALVE REPAIR ;  Surgeon: Rexene Alberts, MD;  Location: [place];  Service: Open Heart Surgery;  Laterality: N/A;  . Tee without cardioversion N/A 04/30/2016    Procedure: TRANSESOPHAGEAL ECHOCARDIOGRAM (TEE);  Surgeon: Rexene Alberts, MD;  Location: [place];  Service: Open Heart Surgery;  Laterality: N/A;   Family History  Problem Relation Age of Onset  . Allergies    . Hypertension    . Coronary artery disease Mother 69  . Stroke Father   . COPD Father   . Clotting disorder Maternal Grandmother     died from blood clot   Social History  Substance Use Topics  . Smoking status: Former Smoker --  0.20 packs/day for 40 years    Types: Cigarettes    Quit date: 04/20/2012  . Smokeless tobacco: Never Used     Comment: 6/5//13 "a pack of cigarettes would last me 1 - 1 1/2 wk"  . Alcohol Use: No   OB History    No data available     Review of Systems  All other systems reviewed and are negative.     Allergies  Codeine  Home Medications   Prior to Admission medications   Medication Sig Start Date End Date Taking? Authorizing Provider  albuterol (PROVENTIL HFA;VENTOLIN HFA) 108 (90 BASE) MCG/ACT inhaler Inhale 2 puffs into the lungs every 4 (four) hours  as needed. For shortness of breath. 04/30/12   Shanker Kristeen Mans, MD  ALPRAZolam Duanne Moron) 0.5 MG tablet Take 0.5 mg by mouth 3 (three) times daily as needed for anxiety. Reported on 04/09/2016    Historical Provider, MD  amiodarone (PACERONE) 200 MG tablet Take 1 tablet (200 mg total) by mouth daily. 06/04/16   Lavon Paganini Angiulli, PA-C  aspirin 81 MG tablet Take 81 mg by mouth daily.    Historical Provider, MD  baclofen (LIORESAL) 10 MG tablet Take 0.5 tablets (5 mg total) by mouth 3 (three) times daily. 06/04/16   Lavon Paganini Angiulli, PA-C  carvedilol (COREG) 3.125 MG tablet Take 1 tablet (3.125 mg total) by mouth 2 (two) times daily with a meal. 06/04/16   Lavon Paganini Angiulli, PA-C  cholecalciferol (VITAMIN D) 1000 units tablet Take 1,000 Units by mouth daily.     Historical Provider, MD  clonazePAM (KLONOPIN) 0.5 MG tablet Take 1 tablet (0.5 mg total) by mouth at bedtime. 06/04/16   Lavon Paganini Angiulli, PA-C  clonazePAM (KLONOPIN) 0.5 MG tablet Take 0.5 tablets (0.25 mg total) by mouth 3 (three) times daily as needed for anxiety. 06/04/16   Lavon Paganini Angiulli, PA-C  escitalopram (LEXAPRO) 10 MG tablet Take 1 tablet (10 mg total) by mouth at bedtime. 06/04/16   Lavon Paganini Angiulli, PA-C  famotidine (PEPCID) 20 MG tablet Take 1 tablet (20 mg total) by mouth 2 (two) times daily. 06/04/16   Lavon Paganini Angiulli, PA-C  furosemide (LASIX) 40 MG tablet Take 1 tablet (40 mg total) by mouth 2 (two) times daily. 06/04/16   Lavon Paganini Angiulli, PA-C  insulin detemir (LEVEMIR) 100 UNIT/ML injection Inject 0.45 mLs (45 Units total) into the skin daily. 06/04/16   Lavon Paganini Angiulli, PA-C  lovastatin (MEVACOR) 10 MG tablet Take 10 mg by mouth at bedtime.    Historical Provider, MD  nitroGLYCERIN (NITROSTAT) 0.4 MG SL tablet Place 1 tablet (0.4 mg total) under the tongue every 5 (five) minutes x 3 doses as needed for chest pain. 03/24/16 11/05/19  Liliane Shi, PA-C  nystatin (MYCOSTATIN/NYSTOP) powder Apply topically 2 (two) times daily.  06/04/16   Lavon Paganini Angiulli, PA-C  oxyCODONE (OXY IR/ROXICODONE) 5 MG immediate release tablet Take 1-2 tablets (5-10 mg total) by mouth every 3 (three) hours as needed for severe pain. 06/04/16   Lavon Paganini Angiulli, PA-C  polyethylene glycol (MIRALAX / GLYCOLAX) packet Take 17 g by mouth daily.    Historical Provider, MD  potassium chloride (KLOR-CON) 20 MEQ packet Take 20 mEq by mouth 2 (two) times daily. 06/04/16   Lavon Paganini Angiulli, PA-C  spironolactone (ALDACTONE) 25 MG tablet Take 2 tablets (50 mg total) by mouth daily. Reported on 04/09/2016 06/04/16   Lavon Paganini Angiulli, PA-C  traZODone (DESYREL) 100 MG tablet Take 1  tablet (100 mg total) by mouth at bedtime. 06/04/16   Lavon Paganini Angiulli, PA-C  warfarin (COUMADIN) 5 MG tablet Take 1 tablet (5 mg total) by mouth daily at 6 PM. 06/04/16   Lavon Paganini Angiulli, PA-C   BP 144/78 mmHg  Pulse 64  Temp(Src) 98.3 F (36.8 C) (Oral)  Resp 14  Wt 104.327 kg  SpO2 95% Physical Exam  Constitutional: She appears well-developed and well-nourished. No distress.  HENT:  Head: Normocephalic and atraumatic.  Neck: Neck supple.  Cardiovascular: Normal rate and regular rhythm.   Pulmonary/Chest: Effort normal and breath sounds normal. No respiratory distress. She has no wheezes. She has no rales.      Abdominal: Soft. She exhibits no distension. There is no tenderness. There is no rebound and no guarding.  Morbidly obese   Neurological: She is alert.  Skin: She is not diaphoretic.  Nursing note and vitals reviewed.   ED Course  Procedures (including critical care time) Labs Review Labs Reviewed  BASIC METABOLIC PANEL - Abnormal; Notable for the following:    Glucose, Bld 109 (*)    Calcium 10.8 (*)    All other components within normal limits  CBC WITH DIFFERENTIAL/PLATELET - Abnormal; Notable for the following:    WBC 13.9 (*)    RBC 5.32 (*)    HCT 46.4 (*)    RDW 17.2 (*)    Platelets 545 (*)    Neutro Abs 9.8 (*)    Monocytes Absolute  1.2 (*)    All other components within normal limits  PROTIME-INR - Abnormal; Notable for the following:    Prothrombin Time 19.2 (*)    INR 1.61 (*)    All other components within normal limits  URINALYSIS, ROUTINE W REFLEX MICROSCOPIC (NOT AT [facility])    Imaging Review No results found. I have personally reviewed and evaluated these images and lab results as part of my medical decision-making.   EKG Interpretation None      MDM   Final diagnoses:  Right-sided chest pain    Afebrile, nontoxic patient with right neck, right upper back and right chest pain that is reproducible, suspect this is related to the sling pt has been wearing to support her left arm that is weak from prior stroke.  Pt has no cough or SOB.  Doubt ACS, PE, CHF exacerbation.  Workup significant for subtherapeutic INR, leukocytosis, UA appears infected, pulmonary vascular congestion on xray.  As I was discussing results and plan with patient she mentioned a spot on her left anterior shin that was red - it does appear cellulitis without e/o underlying abscess - related to a scab that is present.  Keflex will cover UTI and cellulitis. Discussed pt with Dr Eulis Foster. Patient and husband very concerned that patient be discharged home promptly so that she can be available for her PACE intake examination - we are supportive of this and attempted to get her out of the ED in a timely manner.   D/C home with instructions to double coumadin dosage x 2 days and have INR rechecked, start antibiotics for UTI and cellulitis with very close PCP follow up.   Discussed result, findings, treatment, and follow up  with patient.  Pt given return precautions.  Pt verbalizes understanding and agrees with plan.         Clayton Bibles, PA-C 06/10/16 [place], MD 06/11/16 [phone]

## 2016-06-10 NOTE — Discharge Instructions (Signed)
Read the information below.  Use the prescribed medication as directed.  Please discuss all new medications with your pharmacist.  You may return to the Emergency Department at any time for worsening condition or any new symptoms that concern you.    If you develop worsening chest pain, shortness of breath, fever, you pass out, or become weak or dizzy, return to the ER for a recheck.   If you develop increased redness, swelling, pus draining from the wound, or fevers greater than 100.4, return to the ER immediately for a recheck.    Please follow up with your PACE program assessment today as planned.  Take a double dose of coumadin (10mg ) tonight and tomorrow night and have your INR (coumadin level) checked this week.      Chest Wall Pain Chest wall pain is pain in or around the bones and muscles of your chest. Sometimes, an injury causes this pain. Sometimes, the cause may not be known. This pain may take several weeks or longer to get better. HOME CARE Pay attention to any changes in your symptoms. Take these actions to help with your pain:  Rest as told by your doctor.  Avoid activities that cause pain. Try not to use your chest, belly (abdominal), or side muscles to lift heavy things.  If directed, apply ice to the painful area:  Put ice in a plastic bag.  Place a towel between your skin and the bag.  Leave the ice on for 20 minutes, 2-3 times per day.  Take over-the-counter and prescription medicines only as told by your doctor.  Do not use tobacco products, including cigarettes, chewing tobacco, and e-cigarettes. If you need help quitting, ask your doctor.  Keep all follow-up visits as told by your doctor. This is important. GET HELP IF:  You have a fever.  Your chest pain gets worse.  You have new symptoms. GET HELP RIGHT AWAY IF:  You feel sick to your stomach (nauseous) or you throw up (vomit).  You feel sweaty or light-headed.  You have a cough with phlegm (sputum)  or you cough up blood.  You are short of breath.   This information is not intended to replace advice given to you by your health care provider. Make sure you discuss any questions you have with your health care provider.   Document Released: 04/27/2008 Document Revised: 07/31/2015 Document Reviewed: 02/04/2015 Elsevier Interactive Patient Education 2016 Elsevier Inc.  Urinary Tract Infection A urinary tract infection (UTI) can occur any place along the urinary tract. The tract includes the kidneys, ureters, bladder, and urethra. A type of germ called bacteria often causes a UTI. UTIs are often helped with antibiotic medicine.  HOME CARE   If given, take antibiotics as told by your doctor. Finish them even if you start to feel better.  Drink enough fluids to keep your pee (urine) clear or pale yellow.  Avoid tea, drinks with caffeine, and bubbly (carbonated) drinks.  Pee often. Avoid holding your pee in for a long time.  Pee before and after having sex (intercourse).  Wipe from front to back after you poop (bowel movement) if you are a woman. Use each tissue only once. GET HELP RIGHT AWAY IF:   You have back pain.  You have lower belly (abdominal) pain.  You have chills.  You feel sick to your stomach (nauseous).  You throw up (vomit).  Your burning or discomfort with peeing does not go away.  You have a fever.  Your symptoms are not better in 3 days. MAKE SURE YOU:   Understand these instructions.  Will watch your condition.  Will get help right away if you are not doing well or get worse.   This information is not intended to replace advice given to you by your health care provider. Make sure you discuss any questions you have with your health care provider.   Document Released: 04/27/2008 Document Revised: 11/30/2014 Document Reviewed: 06/09/2012 Elsevier Interactive Patient Education [employer].

## 2016-06-12 ENCOUNTER — Encounter: Payer: Medicaid Other | Attending: Physical Medicine & Rehabilitation | Admitting: Physical Medicine & Rehabilitation

## 2016-06-12 ENCOUNTER — Telehealth: Payer: Self-pay

## 2016-06-12 ENCOUNTER — Encounter: Payer: Self-pay | Admitting: Physical Medicine & Rehabilitation

## 2016-06-12 VITALS — BP 106/70 | HR 66 | Resp 14

## 2016-06-12 DIAGNOSIS — Z9889 Other specified postprocedural states: Secondary | ICD-10-CM | POA: Insufficient documentation

## 2016-06-12 DIAGNOSIS — E785 Hyperlipidemia, unspecified: Secondary | ICD-10-CM | POA: Insufficient documentation

## 2016-06-12 DIAGNOSIS — E1142 Type 2 diabetes mellitus with diabetic polyneuropathy: Secondary | ICD-10-CM

## 2016-06-12 DIAGNOSIS — K449 Diaphragmatic hernia without obstruction or gangrene: Secondary | ICD-10-CM | POA: Insufficient documentation

## 2016-06-12 DIAGNOSIS — I63231 Cerebral infarction due to unspecified occlusion or stenosis of right carotid arteries: Secondary | ICD-10-CM

## 2016-06-12 DIAGNOSIS — I609 Nontraumatic subarachnoid hemorrhage, unspecified: Secondary | ICD-10-CM | POA: Diagnosis present

## 2016-06-12 DIAGNOSIS — E1159 Type 2 diabetes mellitus with other circulatory complications: Secondary | ICD-10-CM

## 2016-06-12 DIAGNOSIS — I38 Endocarditis, valve unspecified: Secondary | ICD-10-CM

## 2016-06-12 DIAGNOSIS — I272 Other secondary pulmonary hypertension: Secondary | ICD-10-CM | POA: Diagnosis not present

## 2016-06-12 DIAGNOSIS — I340 Nonrheumatic mitral (valve) insufficiency: Secondary | ICD-10-CM | POA: Diagnosis not present

## 2016-06-12 DIAGNOSIS — G8194 Hemiplegia, unspecified affecting left nondominant side: Secondary | ICD-10-CM | POA: Diagnosis not present

## 2016-06-12 DIAGNOSIS — I69122 Dysarthria following nontraumatic intracerebral hemorrhage: Secondary | ICD-10-CM | POA: Insufficient documentation

## 2016-06-12 DIAGNOSIS — N390 Urinary tract infection, site not specified: Secondary | ICD-10-CM | POA: Diagnosis not present

## 2016-06-12 DIAGNOSIS — G8114 Spastic hemiplegia affecting left nondominant side: Secondary | ICD-10-CM

## 2016-06-12 DIAGNOSIS — I480 Paroxysmal atrial fibrillation: Secondary | ICD-10-CM | POA: Diagnosis not present

## 2016-06-12 DIAGNOSIS — J449 Chronic obstructive pulmonary disease, unspecified: Secondary | ICD-10-CM | POA: Diagnosis not present

## 2016-06-12 DIAGNOSIS — F410 Panic disorder [episodic paroxysmal anxiety] without agoraphobia: Secondary | ICD-10-CM | POA: Diagnosis not present

## 2016-06-12 DIAGNOSIS — Z8673 Personal history of transient ischemic attack (TIA), and cerebral infarction without residual deficits: Secondary | ICD-10-CM | POA: Insufficient documentation

## 2016-06-12 DIAGNOSIS — M1990 Unspecified osteoarthritis, unspecified site: Secondary | ICD-10-CM | POA: Diagnosis not present

## 2016-06-12 DIAGNOSIS — I110 Hypertensive heart disease with heart failure: Secondary | ICD-10-CM | POA: Diagnosis not present

## 2016-06-12 DIAGNOSIS — I5032 Chronic diastolic (congestive) heart failure: Secondary | ICD-10-CM | POA: Diagnosis not present

## 2016-06-12 DIAGNOSIS — D735 Infarction of spleen: Secondary | ICD-10-CM | POA: Diagnosis not present

## 2016-06-12 DIAGNOSIS — I5033 Acute on chronic diastolic (congestive) heart failure: Secondary | ICD-10-CM | POA: Diagnosis present

## 2016-06-12 DIAGNOSIS — F411 Generalized anxiety disorder: Secondary | ICD-10-CM

## 2016-06-12 NOTE — Progress Notes (Signed)
Subjective:    Patient ID: Margaret [name], female    DOB: 03-07-1956, 60 y.o.   MRN: [id number]  HPI  60 year old right-handed female with history of diastolic congestive heart failure, subarachnoid hemorrhage with left parietooccipital region and cortical, subcortical ischemic infarcts, March of 2017, discharged to skilled nursing facility, complicated by Streptococcus viridans and findings of severe mitral regurgitation as well as history of type 2 diabetes mellitus, COPD, splenic infarct February 2017, while maintained on Xarelto presents for transitional care management after receiving CIR for debility after mitral valve repair.   DATE OF ADMISSION:  05/12/2016 DATE OF DISCHARGE:  06/04/2016 At discharge, she was instructed to follow up with Dr. Ricard Dillon.  She has not scheduled any of her follow up appointments due to transportation issues.  She states [facility] has come to her house.  She is no longer on a dysphagia.  She is compliant with her heart medications.  Her CBGs have been <130.  Since discharge she went to the ED because she had pain in her arm and thought she was having a stroke.  Labs were ordered, which showed UTI and she is on abx.   Therapies: 2/week DME: Tub shower, bedside commode Mobility: Wheelchair for mobility, hemiwalker for transfers  Pain Inventory Average Pain 3 Pain Right Now 3 My pain is intermittent and aching  In the last 24 hours, has pain interfered with the following? General activity 0 Relation with others 0 Enjoyment of life 0 What TIME of day is your pain at its worst? varies Sleep (in general) Fair  Pain is worse with: some activites Pain improves with: heat/ice and medication Relief from Meds: 10  Mobility use a walker use a wheelchair needs help with transfers  Function I need assistance with the following:  dressing, bathing, toileting, meal prep, household duties and shopping  Neuro/Psych bladder control  problems weakness trouble walking depression anxiety  Prior Studies hospital f/u  Physicians involved in your care hospital f/u   Family History  Problem Relation Age of Onset  . Allergies    . Hypertension    . Coronary artery disease Mother 68  . Stroke Father   . COPD Father   . Clotting disorder Maternal Grandmother     died from blood clot   Social History   Social History  . Marital Status: Married    Spouse Name: N/A  . Number of Children: N/A  . Years of Education: N/A   Social History Main Topics  . Smoking status: Former Smoker -- 0.20 packs/day for 40 years    Types: Cigarettes    Quit date: 04/20/2012  . Smokeless tobacco: Never Used     Comment: 6/5//13 "a pack of cigarettes would last me 1 - 1 1/2 wk"  . Alcohol Use: No  . Drug Use: No  . Sexual Activity: Not Currently     Comment: married, 2 children, work with computers   Other Topics Concern  . None   Social History Narrative   Past Surgical History  Procedure Laterality Date  . Laceration repair  1966    "right upper arm"  . Dilation and curettage of uterus  1990's  . Finger reattachment  1973    "right ring finger"  . Tee without cardioversion N/A 02/05/2016    Procedure: TRANSESOPHAGEAL ECHOCARDIOGRAM (TEE);  Surgeon: Larey Dresser, MD;  Location: [place];  Service: Cardiovascular;  Laterality: N/A;  . Radiology with anesthesia N/A 02/06/2016    Procedure: RADIOLOGY  WITH ANESTHESIA;  Surgeon: Luanne Bras, MD;  Location: [place];  Service: Radiology;  Laterality: N/A;  . Cardiac catheterization N/A 04/23/2016    Procedure: Right/Left Heart Cath and Coronary Angiography;  Surgeon: Sherren Mocha, MD;  Location: [facility] [facility];  Service: Cardiovascular;  Laterality: N/A;  . Mitral valve repair N/A 04/30/2016    Procedure: MITRAL VALVE REPAIR ;  Surgeon: Rexene Alberts, MD;  Location: [place];  Service: Open Heart Surgery;  Laterality: N/A;  . Tee without cardioversion N/A 04/30/2016     Procedure: TRANSESOPHAGEAL ECHOCARDIOGRAM (TEE);  Surgeon: Rexene Alberts, MD;  Location: [place];  Service: Open Heart Surgery;  Laterality: N/A;   Past Medical History  Diagnosis Date  . Hypertension   . Hyperlipidemia   . COPD (chronic obstructive pulmonary disease) ([place]) 04/20/12  . Asthma   . Type II diabetes mellitus ([place])   . Anemia   . H/O hiatal hernia   . Osteoarthritis   . Pneumonia 04/2012  . Migraines   . Panic attacks 04/20/12  . Morbid obesity ([place])   . Heart murmur   . Bacterial endocarditis     Strep viridans   . Chronic diastolic CHF (congestive heart failure) ([place]) 04/27/2012  . Pulmonary hypertension assoc with unclear multi-factorial mechanisms ([place]) 01/23/2016  . Positive ANA (antinuclear antibody)   . 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 Stroke ([place])   . Subarachnoid bleed ([place]) 02/01/2016  . S/P mitral valve repair 04/30/2016    Complex valvuloplasty including autologous pericardial patch repair of perforated posterior leaflet, artificial Gore-tex neochord placement x6 and 26 mm Sorin Memo 3D ring annuloplasty   BP 106/70 mmHg  Pulse 66  Resp 14  SpO2 92%  Opioid Risk Score:   Fall Risk Score:  `1  Depression screen PHQ 2/9  No flowsheet data found.   Review of Systems  HENT: Negative.   Eyes: Negative.   Respiratory: Negative.   Cardiovascular: Negative.   Gastrointestinal: Positive for nausea.  Endocrine: Negative.   Genitourinary: Positive for difficulty urinating.  Musculoskeletal: Positive for gait problem.  Allergic/Immunologic: Negative.   Neurological: Positive for weakness.  Hematological: Bruises/bleeds easily.  Psychiatric/Behavioral: Positive for dysphoric mood. The patient is nervous/anxious.   All other systems reviewed and are negative.     Objective:   Physical Exam Constitutional: She appears well-developed. Obese.   HENT:  Normocephalic and atraumatic.   Eyes: EOM and Conj are normal.  Cardiovascular: Regular Rate and rhythm.    Respiratory: No respiratory distress. Effort normal.   GI: Soft. Bowel sounds are normal. Non-tender.  No guarding. Musculoskeletal: She exhibits edema. No tenderness. Left shoulder with kineseotaping in place.  Neurological: She is alert. A cranial nerve deficit is present.   Left facial weakness  Dysarthria.  Memory good. RUE/RLE: 5/5 proximal to distal LUE 0/5 proximal to distal.   LLE: 2/5 proximal to distal. mAS    0/4 Left elbow extension  1+/4 left finger flexors             1/4 ankle plantar flexion Skin: Chest incision clean dry, healing. Vascular changes bilateral lower extremities  Psychiatric:  Normal behavior, mood, and affect.    Assessment & Plan:  60 year old right-handed female with history of diastolic congestive heart failure, subarachnoid hemorrhage with left parietooccipital region and cortical, subcortical ischemic infarcts, March of 2017, discharged to skilled nursing facility, complicated by Streptococcus viridans and findings of severe mitral regurgitation as well as history of type 2 diabetes mellitus, COPD, splenic  infarct February 2017, while maintained on Xarelto presents for transitional care management after receiving CIR for debility after mitral valve repair.    1.  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  Cont Sternal precautions  Cont LLE and LUE braces  Cont to follow up with providers after arranging for transport   Dr. Darylene Price, CTS   Dr. Richardson Dopp, Cardiology   Dr. York Ram, PCP   Dr. Erlinda Hong, Neurology  2. Spastic nondominant left hemiplegia   Cont Baclofen 5mg   Will consider Botox for finger flexors and plantar flexors in future  3. S/p MVR with Paroxysmal A. Fib  Cont meds  Cont coumadin, Cont INR checks  4. Pain Management  Cont PRN pain meds.  5. Mood/panic attacks  Cont Lexapro 10 mg daily at bedtime,  Klonopin 0.5 mg daily at bedtime and 3 times a day as needed  6. UTI  Cont abx  7. Acute on chronic diastolic congestive heart failure.    Follow up with Cardiology regarding adjustments to meds, especially given recent CXR in ED   8. Diabetes mellitus with peripheral neuropathy.   Appears to be well controlled at present  Cont meds.   Referrals reviewed Medications reviewed All questions answered

## 2016-06-12 NOTE — Telephone Encounter (Signed)
Ronalee Belts Myers-OT-states that there is a delay in her OT due to pending Medicaid. FYI

## 2016-06-17 ENCOUNTER — Telehealth: Payer: Self-pay | Admitting: *Deleted

## 2016-06-17 ENCOUNTER — Ambulatory Visit (INDEPENDENT_AMBULATORY_CARE_PROVIDER_SITE_OTHER): Payer: Medicaid Other | Admitting: Cardiology

## 2016-06-17 ENCOUNTER — Ambulatory Visit (INDEPENDENT_AMBULATORY_CARE_PROVIDER_SITE_OTHER): Payer: Medicaid Other | Admitting: *Deleted

## 2016-06-17 ENCOUNTER — Encounter: Payer: Self-pay | Admitting: Cardiology

## 2016-06-17 ENCOUNTER — Encounter (HOSPITAL_COMMUNITY): Payer: Self-pay | Admitting: *Deleted

## 2016-06-17 VITALS — BP 88/60 | HR 50 | Ht 66.0 in | Wt 233.0 lb

## 2016-06-17 DIAGNOSIS — I10 Essential (primary) hypertension: Secondary | ICD-10-CM

## 2016-06-17 DIAGNOSIS — I480 Paroxysmal atrial fibrillation: Secondary | ICD-10-CM | POA: Diagnosis not present

## 2016-06-17 DIAGNOSIS — Z5181 Encounter for therapeutic drug level monitoring: Secondary | ICD-10-CM | POA: Diagnosis not present

## 2016-06-17 DIAGNOSIS — Z79899 Other long term (current) drug therapy: Secondary | ICD-10-CM | POA: Diagnosis not present

## 2016-06-17 DIAGNOSIS — I669 Occlusion and stenosis of unspecified cerebral artery: Secondary | ICD-10-CM

## 2016-06-17 DIAGNOSIS — I76 Septic arterial embolism: Secondary | ICD-10-CM

## 2016-06-17 DIAGNOSIS — Z8679 Personal history of other diseases of the circulatory system: Secondary | ICD-10-CM

## 2016-06-17 DIAGNOSIS — Z7901 Long term (current) use of anticoagulants: Secondary | ICD-10-CM

## 2016-06-17 DIAGNOSIS — Z9889 Other specified postprocedural states: Secondary | ICD-10-CM | POA: Diagnosis not present

## 2016-06-17 DIAGNOSIS — E785 Hyperlipidemia, unspecified: Secondary | ICD-10-CM

## 2016-06-17 DIAGNOSIS — I340 Nonrheumatic mitral (valve) insufficiency: Secondary | ICD-10-CM | POA: Diagnosis not present

## 2016-06-17 DIAGNOSIS — I6340 Cerebral infarction due to embolism of unspecified cerebral artery: Secondary | ICD-10-CM

## 2016-06-17 DIAGNOSIS — I059 Rheumatic mitral valve disease, unspecified: Secondary | ICD-10-CM

## 2016-06-17 DIAGNOSIS — I6930 Unspecified sequelae of cerebral infarction: Secondary | ICD-10-CM | POA: Diagnosis not present

## 2016-06-17 DIAGNOSIS — I5032 Chronic diastolic (congestive) heart failure: Secondary | ICD-10-CM

## 2016-06-17 DIAGNOSIS — I058 Other rheumatic mitral valve diseases: Secondary | ICD-10-CM

## 2016-06-17 LAB — POCT INR: INR: 4.2

## 2016-06-17 LAB — CBC
HCT: 43.9 % (ref 35.0–45.0)
Hemoglobin: 13.4 g/dL (ref 11.7–15.5)
MCH: 25.8 pg — AB (ref 27.0–33.0)
MCHC: 30.5 g/dL — AB (ref 32.0–36.0)
MCV: 84.4 fL (ref 80.0–100.0)
MPV: 9.9 fL (ref 7.5–12.5)
PLATELETS: 591 10*3/uL — AB (ref 140–400)
RBC: 5.2 MIL/uL — ABNORMAL HIGH (ref 3.80–5.10)
RDW: 16.8 % — ABNORMAL HIGH (ref 11.0–15.0)
WBC: 10.2 10*3/uL (ref 3.8–10.8)

## 2016-06-17 MED ORDER — POTASSIUM CHLORIDE 20 MEQ PO PACK: 20.0000 meq | PACK | Freq: Every day | ORAL | Status: DC

## 2016-06-17 MED ORDER — FUROSEMIDE 40 MG PO TABS: 40.0000 mg | ORAL_TABLET | Freq: Every day | ORAL | 1 refills | Status: DC

## 2016-06-17 NOTE — Patient Instructions (Addendum)
Your provider recommends that you schedule a  appointment with coumadin clinic.  Your physician has recommended you make the following change in your medication: the furosemide and potassium has been changed once daily from twice daily.  Your physician recommends that you return for lab work.   Your physician recommends that you schedule a follow-up appointment in: 6 weeks with Dr. Meda Coffee.

## 2016-06-17 NOTE — Telephone Encounter (Signed)
Pt was seen in the CVRR clinic after being evaluated by Cecilie Kicks, NP.  Pt was in the hospital from 6/30-7/13 & per hospital d/c record she as to have INR checked by [facility] RN.  The pt stated she was receiving HH but they were not checking INR, therefore after new CVRR visit in the office, I  called [facility] & spoke with Larene [place] & she stated that Nursing was seeeing the pt & instructed me to fax orders to them for INR check. Faxed INR orders & made attention to [facility] as requested by [employer]. Pt informed of this information, too.

## 2016-06-17 NOTE — Progress Notes (Signed)
Cardiology Office Note   Date:  06/17/2016   ID:  [name], [name] Jul 18, 1956, MRN [id number]  PCP:  Pcp Not In System  Cardiologist:  Dr. Meda Coffee    Chief Complaint  Patient presents with  . Cardiac Valve Problem    endocarditis requiring MV repair      History of Present Illness: Margaret [name] is a 60 y.o. female who presents for follow up [place] and PAF also with splenic infarct. .    She has a hx of COPD, HFpEF (seen by Dr. Tamala Julian during hospital admission 7/13), OSA, HTN, HL, poorly controlled diabetes, tobacco abuse. Admitted in 2/17 with splenic infarct and was placed on Xarelto for anticoagulation.   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 She was noted to have strep viridans bacteremia and her presentation was felt to be related to septic emboli. 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 She underwent endovascular thrombectomy by IR. It was felt that she would need to undergo rehabilitation with significant recovery before proceeding to mitral valve surgery. She was discharged to [name].   In May 2017 she was volume overloaded, lasixadjusted. Repeat Echo demonstrated persistent MV vegetation and severe MR. Dr. Linus Salmons with ID has continued her on IV antibiotics.   She ultimately underwent Mitral Valve Repair on 04/30/2016.  She tolerated the procedure well and was taken to the SICU in stable condition.  During her stay in the SICU the patient was weaned and extubated on POD #1.  She was weaned off Neo synephrine and Milrinone drips as tolerated.  She was diuresed with a Lasix drip for hypervolemia.  She had issues with agitation and sundowning during her stay in the SICU.  She went to inpt rehab at [facility] at discharge 05/12/16 and continued  to improve until discharge home 06/04/16.    Home health does come for PT but they have not drawn an INR.  She was seen in ER on the 19th and found with UTI and placed on Keflex.  She is also on amiodarone to prevent a fib.   Will check INR today.  Today she is in wheelchair due to her CVA, difficulty with movement on Lt arm both legs weak. She can pivot to BR at her home.  She denies any chest pain or SOB.  She has no awareness of irregular HR.  She is planning to be [facility] of [facility] and they will be able help with meds.  The MD had called me yesterday to check on INR and therapeutic range which is 2-3.  On anticoagulation for splenic infarct, PAF   Past Medical History:  Diagnosis Date  . Anemia   . Asthma   . Bacterial endocarditis    Strep viridans   . Chronic diastolic CHF (congestive heart failure) ([place]) 04/27/2012  . COPD (chronic obstructive pulmonary disease) ([place]) 04/20/12  . H/O hiatal hernia   . Heart murmur   . Hyperlipidemia   . Hypertension   . Migraines   . Morbid obesity ([name])   . Osteoarthritis   . Panic attacks 04/20/12  . Pneumonia 04/2012  . Positive ANA (antinuclear antibody)   . Pulmonary hypertension assoc with unclear multi-factorial mechanisms ([place]) 01/23/2016  . S/P mitral valve repair 04/30/2016   Complex valvuloplasty including autologous pericardial patch repair of perforated posterior leaflet, artificial  Gore-tex neochord placement x6 and 26 mm Sorin Memo 3D ring annuloplasty  . 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 Stroke ([name])   . Subarachnoid bleed ([name]) 02/01/2016  . Type II diabetes mellitus ([name])     Past Surgical History:  Procedure Laterality Date  . CARDIAC CATHETERIZATION N/A 04/23/2016   Procedure: Right/Left Heart Cath and Coronary Angiography;  Surgeon: Sherren Mocha, MD;  Location: [facility];  Service: Cardiovascular;  Laterality: N/A;  . DILATION AND CURETTAGE OF UTERUS  1990's  . finger reattachment  1973   "right ring finger"  . [place]   "right upper arm"  . MITRAL VALVE REPAIR N/A 04/30/2016   Procedure: MITRAL VALVE REPAIR ;  Surgeon: Rexene Alberts, MD;  Location: [place];  Service: Open Heart Surgery;  Laterality: N/A;  . RADIOLOGY WITH ANESTHESIA N/A 02/06/2016   Procedure: RADIOLOGY WITH ANESTHESIA;  Surgeon: Luanne Bras, MD;  Location: [facility];  Service: Radiology;  Laterality: N/A;  . TEE WITHOUT CARDIOVERSION N/A 02/05/2016   Procedure: TRANSESOPHAGEAL ECHOCARDIOGRAM (TEE);  Surgeon: Larey Dresser, MD;  Location: [place];  Service: Cardiovascular;  Laterality: N/A;  . TEE WITHOUT CARDIOVERSION N/A 04/30/2016   Procedure: TRANSESOPHAGEAL ECHOCARDIOGRAM (TEE);  Surgeon: Rexene Alberts, MD;  Location: [place];  Service: Open Heart Surgery;  Laterality: N/A;     Current Outpatient Prescriptions  Medication Sig Dispense Refill  . albuterol (PROVENTIL HFA;VENTOLIN HFA) 108 (90 BASE) MCG/ACT inhaler Inhale 2 puffs into the lungs every 4 (four) hours as needed. Reported on 06/12/2016    . ALPRAZolam (XANAX) 0.5 MG tablet Take 0.5 mg by mouth 3 (three) times daily as needed for anxiety. Reported on 06/12/2016    . amiodarone (PACERONE) 200 MG tablet Take 1 tablet (200 mg total) by mouth daily. 30 tablet 1  . aspirin 81 MG tablet Take 81 mg by mouth daily.    . baclofen (LIORESAL) 10 MG tablet Take 0.5 tablets (5 mg total) by mouth 3 (three) times daily. 90 tablet 1  . carvedilol (COREG) 3.125 MG tablet Take 1 tablet (3.125 mg total) by mouth 2 (two) times daily with a meal. 60 tablet 1  . cephALEXin (KEFLEX) 500 MG capsule Take 1 capsule (500 mg total) by mouth 4 (four) times daily. 40 capsule 0  . cholecalciferol (VITAMIN D) 1000 units tablet Take 1,000 Units by mouth daily.     . clonazePAM (KLONOPIN) 0.5 MG tablet Take 1 tablet (0.5 mg total) by mouth at bedtime. 30 tablet 0  . escitalopram (LEXAPRO) 10 MG tablet Take 1 tablet (10 mg total) by mouth at bedtime. 30 tablet 1  . famotidine (PEPCID) 20 MG  tablet Take 1 tablet (20 mg total) by mouth 2 (two) times daily. 60 tablet 1  . furosemide (LASIX) 40 MG tablet Take 1 tablet (40 mg total) by mouth daily. 60 tablet 1  . insulin detemir (LEVEMIR) 100 UNIT/ML injection Inject 0.45 mLs (45 Units total) into the skin daily. 10 mL 11  . lovastatin (MEVACOR) 10 MG tablet Take 10 mg by mouth at bedtime.    . nitroGLYCERIN (NITROSTAT) 0.4 MG SL tablet Place 1 tablet (0.4 mg total) under the tongue every 5 (five) minutes x 3 doses as needed for chest pain. 25 tablet 3  . nystatin (MYCOSTATIN/NYSTOP) powder Apply topically 2 (two) times daily. 15 g 0  . polyethylene glycol (MIRALAX / GLYCOLAX) packet Take 17 g by mouth daily.    . potassium chloride (KLOR-CON)  20 MEQ packet Take 20 mEq by mouth daily. 60 tablet   . spironolactone (ALDACTONE) 25 MG tablet Take 2 tablets (50 mg total) by mouth daily. Reported on 04/09/2016 30 tablet 1  . traMADol (ULTRAM) 50 MG tablet Take 50 mg by mouth every 6 (six) hours as needed for moderate pain.     . traZODone (DESYREL) 100 MG tablet Take 1 tablet (100 mg total) by mouth at bedtime. 30 tablet 0  . warfarin (COUMADIN) 5 MG tablet Take 1 tablet (5 mg total) by mouth daily at 6 PM. 30 tablet 1   No current facility-administered medications for this visit.     Allergies:   Codeine    Social History:  The patient  reports that she quit smoking about 4 years ago. Her smoking use included Cigarettes. She has a 8.00 pack-year smoking history. She has never used smokeless tobacco. She reports that she does not drink alcohol or use drugs.   Family History:  [name] family history includes COPD in her father; Clotting disorder in her maternal grandmother; Coronary artery disease (age of onset: 91) in her mother; Stroke in her father.    ROS:  General:no colds or fevers, no weight changes Skin:no rashes or ulcers HEENT:no blurred vision, no congestion CV:see HPI PUL:see HPI GI:no diarrhea constipation or melena,  no indigestion GU:no hematuria, no dysuria since beginning antibiotic MS:no joint pain, no claudication Neuro:no syncope, no lightheadedness Endo:+ diabetes, no thyroid disease  Wt Readings from Last 3 Encounters:  06/17/16 233 lb (105.7 kg)  06/10/16 230 lb (104.3 kg)  06/04/16 236 lb 5.3 oz (107.2 kg)     PHYSICAL EXAM: VS:  BP (!) 88/60   Pulse (!) 50   Ht 5\' 6"  (1.676 m)   Wt 233 lb (105.7 kg)   BMI 37.61 kg/m  , BMI Body mass index is 37.61 kg/m. General:Pleasant affect, NAD Skin:Warm and dry, brisk capillary refill HEENT:normocephalic, sclera clear, mucus membranes moist Neck:supple, no JVD, no bruits  Heart:S1S2 RRR without murmur, gallup, rub or click Lungs:clear without rales, rhonchi, or wheezes [id number], non tender, + BS, do not palpate liver spleen or masses Ext:no lower ext edema, abrasion from fall on Lt shin , 2+ radial pulses Neuro:alert and oriented X 3, MAE, follows commands, + facial symmetry    EKG:  EKG is ordered today. The ekg ordered today demonstrates sinus brady at 50 no acute changes in EKG    Recent Labs: 05/06/2016: Magnesium 1.8 05/08/2016: B Natriuretic Peptide 723.6 05/13/2016: ALT 25 06/10/2016: BUN 16; Creatinine, Ser 0.97; Hemoglobin 14.0; Platelets 545; Potassium 4.1; Sodium 135    Lipid Panel    Component Value Date/Time   CHOL 155 02/02/2016 1510   TRIG 137 02/02/2016 1510   HDL 21 (L) 02/02/2016 1510   CHOLHDL 7.4 02/02/2016 1510   VLDL 27 02/02/2016 1510   LDLCALC 107 (H) 02/02/2016 1510       Other studies Reviewed: Additional studies/ records that were reviewed today include: . Study Conclusions  - Left ventricle: The cavity size was normal. Wall thickness was   normal. Systolic function was normal. The estimated ejection   fraction was in the range of 60% to 65%. Wall motion was normal;   there were no regional wall motion abnormalities. The study is   not technically sufficient to allow evaluation of LV  diastolic   function. - Mitral valve: Mitral valve annuloplasty repair. [place] mild   mitral stenosis. Trivial MR. Pressure half-time: 178 ms.  Mean   gradient (D): 4 mm Hg. Valve area by continuity equation (using   LVOT flow): 1.59 cm^2. - Left atrium: Moderately dilated. - Right ventricle: The cavity size was mildly dilated. Systolic   function is decreased. - Right atrium: The atrium was at the upper limits of normal in   size. - Tricuspid valve: There was mild regurgitation. - Pulmonary arteries: PA peak pressure: 33 mm Hg (S). - Inferior vena cava: The vessel was normal in size. The   respirophasic diameter changes were in the normal range (>= 50%),   consistent with normal central venous pressure.  Impressions:  - Compared to a prior study on 04/27/2016, the patient is s/p mitral   valve repair with annoloplasty. The repair is excellent with   borderline mild stenosis and trivial regurgitation. LVEF is   60-65% with normal wall motion. RVSP is reduced to 33 mmHg.  POST-OPERATIVE DIAGNOSIS:  SEVERE MR  PROCEDURE:  Procedure(s):  MEDIAN STERNOTOMY  MITRAL VALVE REPAIR -Ring Annuloplasty utilizing a 26 mm Sorin Memo 3D Ring -Autologous Pericardial Patch of Perforation of Posterior Leaflet -Placement of 6 Neo Chords with Chord-x   ASSESSMENT AND PLAN:  1. Endocarditis with mitral valve repair 04/30/16 now improved with increasing energy- last echo with excellent MV repair. Normal EF.  Pt to contact Dr. Roxy Manns for follow up.  2. PAF on amiodarone no further a fib noted.  She is bradycardic today but asymptomatic- need to monitor, she may need BB decreased.  Will check TSH, CMP on amiodarone. On coumadin follow up with Dr. Meda Coffee in 6 weeks.  3. Recent splenic infarct on anticoagulation prior to MV repair was on xarelto  4. Anticoagulation on coumadin, was not therapeutic 06/10/16 her dose was increased for 2 days, she was placed on keflex.  Will check INR today. - she  will discuss with our coumadin clinic on [address] for her for INR checks. Goal 2-3  5.  Hx CVA  6.  Hxr subarachnoid hemorrhage was off anticoagulation for awhile but now back on.  7. Chronic diastolic HF - continue diuretic.  8.  DM followed by PCP  9. HLD continue statin.  10 HTN essential - stable.      Current medicines are reviewed with the patient today.  The patient Has no concerns regarding medicines.  The following changes have been made:  See above Labs/ tests ordered today include:see above  Disposition:   FU:  see above  Signed, Cecilie Kicks, NP  06/17/2016 10:35 PM    [facility], [address], [place], [place] Phone: [phone]; Fax: [phone]

## 2016-06-17 NOTE — Patient Instructions (Signed)

## 2016-06-18 ENCOUNTER — Encounter: Payer: Self-pay | Admitting: Physical Medicine & Rehabilitation

## 2016-06-18 LAB — COMPREHENSIVE METABOLIC PANEL
ALT: 18 U/L (ref 6–29)
AST: 18 U/L (ref 10–35)
Albumin: 3.5 g/dL — ABNORMAL LOW (ref 3.6–5.1)
Alkaline Phosphatase: 93 U/L (ref 33–130)
BUN: 23 mg/dL (ref 7–25)
CHLORIDE: 99 mmol/L (ref 98–110)
CO2: 32 mmol/L — AB (ref 20–31)
CREATININE: 1 mg/dL — AB (ref 0.50–0.99)
Calcium: 11.1 mg/dL — ABNORMAL HIGH (ref 8.6–10.4)
Glucose, Bld: 74 mg/dL (ref 65–99)
POTASSIUM: 5.2 mmol/L (ref 3.5–5.3)
SODIUM: 138 mmol/L (ref 135–146)
Total Bilirubin: 0.3 mg/dL (ref 0.2–1.2)
Total Protein: 7.2 g/dL (ref 6.1–8.1)

## 2016-06-18 LAB — TSH: TSH: 6.45 mIU/L — ABNORMAL HIGH

## 2016-06-19 ENCOUNTER — Telehealth: Payer: Self-pay | Admitting: *Deleted

## 2016-06-19 DIAGNOSIS — R7989 Other specified abnormal findings of blood chemistry: Secondary | ICD-10-CM

## 2016-06-19 NOTE — Telephone Encounter (Signed)
-----   Message from Isaiah Serge, NP sent at 06/18/2016  6:21 PM EDT ----- Labs good, thyroid is a little abnormal due to one of the meds most likely.  But continue, recheck TSH and Free t4 in 2 weeks.

## 2016-06-19 NOTE — Telephone Encounter (Signed)
Pt aware of lab results.  She will come in the week of 8/7 for repeat tsh and free 4. Pt verbalized understanding, Orders put in EPIC.

## 2016-06-25 ENCOUNTER — Telehealth: Payer: Self-pay | Admitting: Physical Medicine & Rehabilitation

## 2016-06-25 DIAGNOSIS — I110 Hypertensive heart disease with heart failure: Secondary | ICD-10-CM | POA: Diagnosis not present

## 2016-06-25 DIAGNOSIS — I5033 Acute on chronic diastolic (congestive) heart failure: Secondary | ICD-10-CM | POA: Diagnosis not present

## 2016-06-25 DIAGNOSIS — Z48812 Encounter for surgical aftercare following surgery on the circulatory system: Secondary | ICD-10-CM | POA: Diagnosis not present

## 2016-06-25 DIAGNOSIS — I69354 Hemiplegia and hemiparesis following cerebral infarction affecting left non-dominant side: Secondary | ICD-10-CM | POA: Diagnosis not present

## 2016-06-25 NOTE — Telephone Encounter (Signed)
Margaret [name] PT with [facility] needs to get an additional 4 visits with patient for strengthening and transfers.  Please call her at [phone].

## 2016-06-25 NOTE — Telephone Encounter (Signed)
Approval given

## 2016-06-29 ENCOUNTER — Ambulatory Visit (INDEPENDENT_AMBULATORY_CARE_PROVIDER_SITE_OTHER): Payer: Medicaid Other | Admitting: Neurology

## 2016-06-29 ENCOUNTER — Ambulatory Visit (INDEPENDENT_AMBULATORY_CARE_PROVIDER_SITE_OTHER): Payer: Medicaid Other | Admitting: Pharmacist

## 2016-06-29 VITALS — BP 140/88 | HR 68 | Resp 20 | Ht 66.0 in

## 2016-06-29 DIAGNOSIS — M25512 Pain in left shoulder: Secondary | ICD-10-CM | POA: Diagnosis not present

## 2016-06-29 DIAGNOSIS — I6340 Cerebral infarction due to embolism of unspecified cerebral artery: Secondary | ICD-10-CM | POA: Diagnosis not present

## 2016-06-29 DIAGNOSIS — I480 Paroxysmal atrial fibrillation: Secondary | ICD-10-CM

## 2016-06-29 DIAGNOSIS — E1159 Type 2 diabetes mellitus with other circulatory complications: Secondary | ICD-10-CM | POA: Diagnosis not present

## 2016-06-29 DIAGNOSIS — I10 Essential (primary) hypertension: Secondary | ICD-10-CM | POA: Diagnosis not present

## 2016-06-29 DIAGNOSIS — I6930 Unspecified sequelae of cerebral infarction: Secondary | ICD-10-CM

## 2016-06-29 DIAGNOSIS — Z5181 Encounter for therapeutic drug level monitoring: Secondary | ICD-10-CM

## 2016-06-29 DIAGNOSIS — I5032 Chronic diastolic (congestive) heart failure: Secondary | ICD-10-CM | POA: Diagnosis not present

## 2016-06-29 DIAGNOSIS — Z9889 Other specified postprocedural states: Secondary | ICD-10-CM

## 2016-06-29 DIAGNOSIS — G8114 Spastic hemiplegia affecting left nondominant side: Secondary | ICD-10-CM | POA: Diagnosis not present

## 2016-06-29 LAB — POCT INR: INR: 1.1

## 2016-06-29 MED ORDER — OXYCODONE-ACETAMINOPHEN 10-325 MG PO TABS: 1.0000 | ORAL_TABLET | Freq: Three times a day (TID) | ORAL | 0 refills | Status: DC | PRN

## 2016-06-29 NOTE — Patient Instructions (Addendum)
-   continue coumadin for afib treatment and stroke prevention and INR goal 2-3. - continue lovastatin for HLD.  - discontinue ASA 81mg   - please continue aggressive PT/OT/speech for stroke rehab - Follow up with your primary care physician for stroke risk factor modification. Recommend maintain blood pressure goal <130/80, diabetes with hemoglobin A1c goal below 6.5% and lipids with LDL cholesterol goal below 70 mg/dL.  - check BP and glucose and record. Bring over to PCP for medication adjustment if needed. If glucose low, may decrease lantus dose and continue monitor.  - percocet for shoulder pain as needed and continue to follow up with Dr. Posey Pronto  - consider to repeat ANA and RF at next visit - follow up in 4 months.

## 2016-06-30 ENCOUNTER — Encounter: Payer: Self-pay | Admitting: Neurology

## 2016-06-30 NOTE — Progress Notes (Signed)
STROKE NEUROLOGY FOLLOW UP NOTE  NAME: Margaret [name] DOB: 1956-04-22  REASON FOR VISIT: stroke follow up HISTORY FROM: pt and husband and chart  Today we had the pleasure of seeing Margaret [name] in follow-up at [facility]. Pt was accompanied by husband.   History Summary Margaret [name] is a 60 y.o. female with history of HTN, HLD, DM, COPD, morbid obesity, migraine, anxiety and panic attacks was admitted in 12/2015 for acute splenic infarction at that time LE venous Doppler showed no evidence of DVT. 2-D echo showed EF of 60-65% and no cardiac source of emboli. Patient was started on heparin drip and transitioned to xarelto with plan to obtain outpatient TEE. On 01/23/16 pt was seen by hematology/oncology who felt the splenic infarct most likely was secondary to splenic congestion from liver cirrhosis in combination of peripheral vascular disease. The plan was for her to remain on anticoagulation for next 6 months.   She was re-admitted on 02/01/16 for mild headache, episodic vertigo, visual changes, low-grade fevers, and nausea. WBC 17.9. CT head showed left parieto-occipital small SAH, MRI confirmed the [name] but also showed at least 4 punctate acute infarcts at watershed area including left MCA/PCA, bilaterally MCA/ACA., embolic pattern. Her Xarelto was discontinued due to [name]. CTV showed no CVT. EEG and LE DVT negative. TTE EF 60-65%. 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 CTA head and neck showed no mycotic aneurysm or AVM. Blood culture 2/2 positive of strep vividan. ID consulted and she was put on rocephin first and then changed to ampicillin Q4h for 6 weeks. Her LDL 107 and A1C 8.4 with positive ANA and RF.   However, on 02/06/16 pt had sudden onset left facial droop, slurry speech, left hemiplegia and right gaze with left neglect, consistent with right large vessel occlusion. tPA not given due to [facility] and cerebral angio showed right ICA terminus  occlusion and endovascular procedure done with TICI3 revascularization. Post procedure MRI showed scattered punctate and small infarcts involving right MCA cortical areas, right BG, left cerebellar. MRA showed questionable 6 mm new aneurysm at right MCA bifurcation. Recommended repeat CTA but pt refused. Cardiac surgery Dr. Roxy Manns consulted and he thinks pt eventually need MV repair or replacement but need neurological recovery first. She was eventually discharged to SNF with ASA 81mg  and lovastatin 20mg . On discharge, pt still has left hemiplegia.  03/25/16 follow up - the patient has been doing the same. Now in [name] and complains that she did not get adequate amount of PT/OT, has finished speech, her antibiotics not given on time. Easily tired, especially after working with PT/OT. Still need O2 at [name]. Both legs swelling with left one has skin breakdown. Complains of severe anxiety and panic attach intermittently, on Xanax PRN at night but effects short lasting. Stated BP and glucose in good control. Today BP 130/70. However, still has dense left hemiplegia with left facial droop and slurry speech.  Interval History During the interval time, the patient has been doing well. Finished off antibiotic treatment. Left-sided weakness much improved than prior. Still in wheelchair but able to walk with 2 steps with semi-walker at home with home PT/OT. Underwent mitral valve repair on 04/30/2016 with Dr. Roxy Manns. CTA head before procedure showed no aneurysm and patent large vessels. Post op, patient was found to have a PAF with RVR, placed on amiodarone and Coumadin, initially INR goal 2.5-3.5, and now INR goal 2-3 after following with cardiology in July. Her aspirin has  been continued with Coumadin. Today blood pressure 140/88 and her glucose at home 100-120 as per pt. She has lost 70lbs since stroke.  REVIEW OF SYSTEMS: Full 14 system review of systems performed and notable only for those listed below and in HPI above, all  others are negative:  Constitutional:   Cardiovascular:  Ear/Nose/Throat:   Skin:  Eyes:   Respiratory:   Gastroitestinal:   Genitourinary:  Hematology/Lymphatic:   Endocrine:  Musculoskeletal:   Allergy/Immunology:   Neurological:  Psychiatric:  Sleep:   The following represents the patient's updated allergies and side effects list: Allergies  Allergen Reactions  . Codeine Hives, Itching and Other (See Comments)    "breathing problems"    The neurologically relevant items on the patient's problem list were reviewed on today's visit.  Neurologic Examination  A problem focused neurological exam (12 or more points of the single system neurologic examination, vital signs counts as 1 point, cranial nerves count for 8 points) was performed.  Blood pressure 140/88, pulse 68, resp. rate 20, height 5\' 6"  (1.676 m).  General - Well nourished, well developed, not in acute distress.  Ophthalmologic - Fundi not visualized due to noncooperation.  Cardiovascular - Regular rate and rhythm.  Mental Status -  Level of arousal and orientation to time, place, and person were intact. Language including expression, naming, repetition, comprehension was assessed and found intact, no dysarthria. Fund of Knowledge was assessed and was intact.  Cranial Nerves II - XII - II - Visual field intact OU. III, IV, VI - Extraocular movements intact. V - Facial sensation intact bilaterally. VII - left facial droop VIII - Hearing & vestibular intact bilaterally. X - Palate elevates symmetrically. XI - Chin turning & shoulder shrug intact bilaterally. XII - Tongue protrusion intact.  Motor Strength - [name] strength was LUE 3/5 proximal and 0/5 distal, LLE 2/5 proximal and knee extension 3/5 and distal 2/5 DF and PF, RUE and RLE 5/5.  Bulk was normal and fasciculations were absent.   Motor Tone - Muscle tone was assessed at the neck and appendages and was normal.  Reflexes - [name]  reflexes were 1+ in all extremities and she had no pathological reflexes.  Sensory - Light touch, temperature/pinprick were assessed and were normal.    Coordination - The patient had normal movements in the right hand with no ataxia or dysmetria.  Tremor was absent.  Gait and Station - in wheelchair.   Data reviewed: I personally reviewed the images and agree with the radiology interpretations.  Dg Chest 2 View 02/01/2016  Cardiomegaly and pulmonary venous congestion.   Ct Head Wo Contrast 02/01/2016  Localized subarachnoid hemorrhage/debris in the left parietal occipital region as discussed above. MRI/MRA recommended.   Mri and Mra Head Wo Contrast 02/01/2016  1. Small volume subarachnoid hemorrhage/debris within the left parieto-occipital region, corresponding to abnormality seen on prior CT. Minimal gyral swelling within this region without significant mass effect or other abnormality.  2. Few sub cm cortical/subcortical ischemic infarcts within the bilateral parietal lobes as above.  3. Negative intracranial MRA.   CTA of head and neck 02/01/2016 1. Mild intracranial and extracranial atherosclerosis without significant stenosis. 2. Patent dural venous sinuses. No vascular malformation identified.  LE venous doppler - Bilateral: No evidence of DVT, superficial thrombosis, or Baker's Cyst.  EEG - Unremarkable awake and drowsy routine inpatient EEG. Clinical correlation is recommended .   TEE - Normal LV size with EF 60-65%. Normal wall motion. Normal RV  size and systolic function. Trivial TR, no TV vegetation. No PV vegetation. Trileaflet aortic valve with no stenosis or regurgitation, no vegetation. There was a mobile vegetation on the posterior leaflet of the mitral valve measuring about 2 cm in greatest dimension. The posterior and anterior leaflets of the MV did not coapt properly and there was very eccentric, anteriorly-directed mitral regurgitation. I  suspect severe MR. Jet was too eccentric for PISA calculations. There was systolic flattening but not flow reversal in the pulmonary vein doppler pattern. The left atrium was mildly dilated, no LA appendage thrombus. Normal right atrium. No evidence for PFO or ASD. Normal caliber aorta with mild plaque in the descending thoracic aorta.  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  Ct Head Wo Contrast 02/07/2016 IMPRESSION: Acute infarct posterior limb internal capsule Small area of subarachnoid hemorrhage right parietal area stable. No new area of hemorrhage. Electronically Signed By: Franchot Gallo M.D. On: 02/07/2016 18:44   MRI and MRA  02/07/2016 MRI HEAD 1. Patchy multi focal acute right MCA territory infarcts involving the right frontal, parietal, and temporal lobes as above, with involvement of the posterior limb of the right internal capsule. Scattered petechial hemorrhage about a few of these infarcts, with probable small volume subarachnoid hemorrhage within the right parietal lobe, similar relative prior CTs, but new relative to most recent MRI. 2. Probable new subcentimeter cortical infarct within the left parietal lobe, with interval evolution of previously identified punctate subcortical left parietal infarcts. 3. Small subacute appearing infarct within the left cerebellar hemisphere, new relative to most recent MRI from 02/01/2016. 4. Slight interval increase in prominence of subarachnoid hemorrhage within the left parieto-occipital region. While this finding may be related to technique, the possibility of small amount of re- hemorrhage within this region should be considered. Attention at follow-up CTs recommended.  MRA 1. No large or proximal arterial branch occlusion within the intracranial circulation. 2. Question new 6 mm abnormality at the level of the right MCA bifurcation. Unclear whether this  reflects motion artifact in relation to normal branch vessels, small amount of residual or new thrombus, and less likely new aneurysm (although this is not entirely excluded, with mycotic aneurysm certainly a possibility given the history of endocarditis). Further evaluation with dedicated CTA recommended. (Patient declined CTA.) 3. No other new abnormality within the intracranial circulation.  Cerebral angiogram 02/06/2016 : S/PComplete revascularization of occluded RT ICA terminus and RT MCA prox with x2 passes with Solitaire FR 14mm x 39mm retrieval device and x1 pass with solitaire FR 87mmx 30 mm retrieval device and 5.5 mg of superselective intraarterial integrelin achieving a TICI 3 revascularization.  CTA head 04/22/2016 1. Expected evolution of multi focal nonhemorrhagic infarcts in the posterior right MCA territory. 2. Mild diffuse distal small vessel disease without a significant proximal stenosis, aneurysm, or branch vessel occlusion.  Component     Latest Ref Rng 01/14/2016 02/02/2016  Cholesterol     0 - 200 mg/dL  155  Triglycerides     <150 mg/dL  137  HDL Cholesterol     >40 mg/dL  21 (L)  Total CHOL/HDL Ratio       7.4  VLDL     0 - 40 mg/dL  27  LDL (calc)     0 - 99 mg/dL  107 (H)  PTT Lupus Anticoagulant     0.0 - 43.6 sec  38.8  DRVVT     0.0 - 44.0 sec  57.5 (H)  Lupus Anticoag Interp       Comment:  Beta-2 Glycoprotein I Ab, IgG     0 - 20 GPI IgG units  <9  Beta-2-Glycoprotein I IgM     0 - 32 GPI IgM units  <9  Beta-2-Glycoprotein I IgA     0 - 25 GPI IgA units  <9  Anticardiolipin Ab,IgG,Qn     0 - 14 GPL U/mL  9  Anticardiolipin Ab,IgM,Qn     0 - 12 MPL U/mL  9  Anticardiolipin Ab,IgA,Qn     0 - 11 APL U/mL  <9  Cytoplasmic (C-ANCA)     Neg:<1:20 titer  <1:20  P-ANCA     Neg:<1:20 titer  <1:20  Atypical P-ANCA titer     Neg:<1:20 titer  <1:20  Recommendations-[id number]:      Comment   Comment      Comment   Hemoglobin A1C     4.8 - 5.6 %  9.1 (H) 8.4 (H)  Mean Plasma Glucose      214 194  Myeloperoxidase Abs     0.0 - 9.0 U/mL  <9.0  ANCA Proteinase 3     0.0 - 3.5 U/mL  <3.5  Speckled Pattern       1:160 (H)  NOTE:       Comment  Protein C-Functional     73 - 180 % 86   Protein C, Total     60 - 150 % 66   Protein S-Functional     63 - 140 % 83   Protein S, Total     60 - 150 % 147   Factor II Activity     50 - 154 % 113   Antithrombin Activity     75 - 120 % 79   Homocysteine     0.0 - 15.0 umol/L  9.3  ds DNA Ab     0 - 9 IU/mL  1  RA Latex Turbid.     0.0 - 13.9 IU/mL  19.8 (H)  ANA Ab, IFA       Positive (A)  SSA (Ro) (ENA) Antibody, IgG     0.0 - 0.9 AI  <0.2  SSB (La) (ENA) Antibody, IgG     0.0 - 0.9 AI  <0.2  dRVVT Mix     0.0 - 44.0 sec  47.3 (H)  dRVVT Confirm     0.8 - 1.2 ratio  1.2    Assessment: As you may recall, she is a 60 y.o. Caucasian female with PMH of history of HTN, HLD, DM, COPD, morbid obesity, migraine, anxiety and panic attacks was admitted in 12/2015 for acute splenic infarction at that time LE venous Doppler showed no evidence of DVT. 2-D echo showed EF of 60-65% and no cardiac source of emboli. Patient was started on heparin drip and transitioned to xarelto.   She was re-admitted on 02/01/16 and CT head showed left parieto-occipital small SAH, MRI confirmed the [name] but also showed at least 4 punctate acute infarcts at watershed area including left MCA/PCA, bilaterally MCA/ACA., embolic pattern. Her Xarelto was discontinued due to [name]. CTV showed no CVT. EEG and LE DVT negative. TTE EF 60-65%. 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 CTA head and neck showed no mycotic aneurysm or AVM. Blood culture 2/2 positive of strep vividan. ID consulted and she was put on rocephin first and then changed to ampicillin Q4h for 6 weeks. Her LDL 107 and A1C 8.4 with positive ANA  and RF.   However, on 02/06/16 pt had sudden onset neuro changes consistent with right large vessel  occlusion. tPA not given due to [name] and cerebral angio showed right ICA terminus occlusion and endovascular procedure done with TICI3 revascularization. Post procedure MRI showed scattered punctate and small infarcts involving right MCA cortical areas, right BG, left cerebellar. MRA showed questionable 6 mm new aneurysm at right MCA bifurcation, but repeat CTA in 03/2016 showed no aneurysm and patent large vessels. Cardiac surgery Dr. Roxy Manns consulted for need MV repair. She underwent mitral valve repair on 04/30/2016. Post op, patient had PAF with RVR, placed on amiodarone and Coumadin, initially INR goal 2.5-3.5, and now INR goal 2-3 after following with cardiology in July. She has discharged from CIR and SNF, now at home and left-sided weakness much improved than prior.   Plan:  - continue coumadin for afib treatment and stroke prevention and INR goal 2-3. - continue lovastatin for HLD and stroke prevention.  - discontinue ASA 81mg   - please continue aggressive PT/OT/speech for stroke rehab - Follow up with your primary care physician for stroke risk factor modification. Recommend maintain blood pressure goal <130/80, diabetes with hemoglobin A1c goal below 6.5% and lipids with LDL cholesterol goal below 70 mg/dL.  - check BP and glucose and record. Bring over to PCP for medication adjustment if needed. If glucose low, may decrease lantus dose and continue monitor.  - percocet for shoulder pain as needed and continue to follow up with Dr. Posey Pronto  - consider to repeat ANA and RF at next visit - follow up in 4 months.  I spent more than 25 minutes of face to face time with the patient. Greater than 50% of time was spent in counseling and coordination of care. We discussed Coumadin management, INR goal, and continued aggressive therapy.   No orders of the defined types were placed in this encounter.   Meds ordered this encounter  Medications  . oxyCODONE-acetaminophen (PERCOCET) 10-325 MG tablet     Sig: Take 1 tablet by mouth every 8 (eight) hours as needed for pain.    Dispense:  60 tablet    Refill:  0    Patient Instructions  - continue coumadin for afib treatment and stroke prevention and INR goal 2-3. - continue lovastatin for HLD.  - discontinue ASA 81mg   - please continue aggressive PT/OT/speech for stroke rehab - Follow up with your primary care physician for stroke risk factor modification. Recommend maintain blood pressure goal <130/80, diabetes with hemoglobin A1c goal below 6.5% and lipids with LDL cholesterol goal below 70 mg/dL.  - check BP and glucose and record. Bring over to PCP for medication adjustment if needed. If glucose low, may decrease lantus dose and continue monitor.  - percocet for shoulder pain as needed and continue to follow up with Dr. Posey Pronto  - consider to repeat ANA and RF at next visit - follow up in 4 months.   Rosalin Hawking, MD PhD [name] [address] [place], [place] [address] [phone]

## 2016-07-06 ENCOUNTER — Ambulatory Visit (INDEPENDENT_AMBULATORY_CARE_PROVIDER_SITE_OTHER): Payer: Medicaid Other | Admitting: Pharmacist

## 2016-07-06 ENCOUNTER — Encounter: Payer: Self-pay | Admitting: Thoracic Surgery (Cardiothoracic Vascular Surgery)

## 2016-07-06 ENCOUNTER — Ambulatory Visit
Admission: RE | Admit: 2016-07-06 | Discharge: 2016-07-06 | Disposition: A | Discharge status: Home / Self Care | Payer: Medicaid Other | Source: Ambulatory Visit | Attending: Thoracic Surgery (Cardiothoracic Vascular Surgery) | Admitting: Thoracic Surgery (Cardiothoracic Vascular Surgery)

## 2016-07-06 ENCOUNTER — Ambulatory Visit (INDEPENDENT_AMBULATORY_CARE_PROVIDER_SITE_OTHER): Payer: Self-pay | Admitting: Thoracic Surgery (Cardiothoracic Vascular Surgery)

## 2016-07-06 VITALS — BP 105/57 | HR 47 | Resp 16 | Ht 66.0 in | Wt 233.0 lb

## 2016-07-06 DIAGNOSIS — I340 Nonrheumatic mitral (valve) insufficiency: Secondary | ICD-10-CM

## 2016-07-06 DIAGNOSIS — Z9889 Other specified postprocedural states: Secondary | ICD-10-CM

## 2016-07-06 DIAGNOSIS — Z5181 Encounter for therapeutic drug level monitoring: Secondary | ICD-10-CM | POA: Diagnosis not present

## 2016-07-06 DIAGNOSIS — I480 Paroxysmal atrial fibrillation: Secondary | ICD-10-CM

## 2016-07-06 DIAGNOSIS — I6930 Unspecified sequelae of cerebral infarction: Secondary | ICD-10-CM | POA: Diagnosis not present

## 2016-07-06 DIAGNOSIS — I5032 Chronic diastolic (congestive) heart failure: Secondary | ICD-10-CM

## 2016-07-06 LAB — POCT INR: INR: 1.9

## 2016-07-06 NOTE — Progress Notes (Signed)
[address]       [place] [address]             [phone]     CARDIOTHORACIC SURGERY OFFICE NOTE  Referring Provider is Dorothy Spark, MD PCP is Pcp Not In System   HPI:  Patient is a 60 year old female with complex past medical history who returns to the office today for routine follow-up status post mitral valve repair on 04/30/2016 for severe mitral regurgitation secondary to bacterial endocarditis.  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 She suffered a massive stroke and was eventually discharged to a local skilled nursing facility. She completed a six-week course of intravenous antibiotics and slowly recovered although she continued to suffer from problems with acute exacerbations of congestive heart failure. She was ultimately hospitalized in May and her congestive heart failure tuned up medically. Following mitral valve repair the patient did remarkably well.  She developed paroxysmal atrial fibrillation for which she was treated with amiodarone. She converted back to sinus rhythm and was discharged to the inpatient rehabilitation service on the 11th postoperative day. The patient has subsequently done quite well. Routine follow-up echocardiogram performed 05/19/2016 revealed intact mitral valve repair with no mitral regurgitation and normal left ventricular systolic function. She made excellent progress on the inpatient rehabilitation service and was ultimately discharged home approximately one month ago. The patient has been seen in follow-up by Cecilie Kicks at [facility] in by her neurologist, Dr. Erlinda Hong.  She was taken off of aspirin but remains anticoagulated using warfarin.  She returns to our office for routine follow-up today. She has continued to make slow but steady improvement with regards to recovery  from her previous stroke. Recently she has begun walking short distances. She has not had any problems with her breathing and she states that her breathing is dramatically better than it was prior to surgery. Her appetite is good. Her strength continues to improve.  The patient does report that she had developed cellulitis involving her left lower leg for which she was given a course of antibiotics as an outpatient from urgent care.   Current Outpatient Prescriptions  Medication Sig Dispense Refill  . ALPRAZolam (XANAX) 0.5 MG tablet Take 0.5 mg by mouth 3 (three) times daily as needed for anxiety. Reported on 06/12/2016    . amiodarone (PACERONE) 200 MG tablet Take 1 tablet (200 mg total) by mouth daily. 30 tablet 1  . baclofen (LIORESAL) 10 MG tablet Take 0.5 tablets (5 mg total) by mouth 3 (three) times daily. 90 tablet 1  . carvedilol (COREG) 3.125 MG tablet Take 1 tablet (3.125 mg total) by mouth 2 (two) times daily with a meal. 60 tablet 1  . cholecalciferol (VITAMIN D) 1000 units tablet Take 1,000 Units by mouth daily.     . clonazePAM (KLONOPIN) 0.5 MG tablet Take 1 tablet (0.5 mg total) by mouth at bedtime. 30 tablet 0  . famotidine (PEPCID) 20 MG tablet Take 1 tablet (20 mg total) by mouth 2 (two) times daily. 60 tablet 1  . furosemide (LASIX) 40 MG tablet Take 1 tablet (40 mg total) by mouth daily. 60 tablet 1  . insulin detemir (LEVEMIR) 100 UNIT/ML injection Inject 0.45 mLs (45 Units total) into the skin daily. 10 mL 11  . lovastatin (MEVACOR) 10 MG tablet Take 10 mg by mouth at bedtime.    Marland Kitchen  nitroGLYCERIN (NITROSTAT) 0.4 MG SL tablet Place 1 tablet (0.4 mg total) under the tongue every 5 (five) minutes x 3 doses as needed for chest pain. 25 tablet 3  . nystatin (MYCOSTATIN/NYSTOP) powder Apply topically 2 (two) times daily. 15 g 0  . oxyCODONE-acetaminophen (PERCOCET) 10-325 MG tablet Take 1 tablet by mouth every 8 (eight) hours as needed for pain. 60 tablet 0  . potassium chloride  (KLOR-CON) 20 MEQ packet Take 20 mEq by mouth daily. 60 tablet   . spironolactone (ALDACTONE) 25 MG tablet Take 2 tablets (50 mg total) by mouth daily. Reported on 04/09/2016 30 tablet 1  . traMADol (ULTRAM) 50 MG tablet Take 50 mg by mouth every 6 (six) hours as needed for moderate pain.     . traZODone (DESYREL) 100 MG tablet Take 1 tablet (100 mg total) by mouth at bedtime. 30 tablet 0  . warfarin (COUMADIN) 5 MG tablet Take 1 tablet (5 mg total) by mouth daily at 6 PM. (Patient taking differently: Take 5 mg by mouth daily at 6 PM. Take 1.5 tablets on Friday, 1 tablet every day other than Friday) 30 tablet 1  . albuterol (PROVENTIL HFA;VENTOLIN HFA) 108 (90 BASE) MCG/ACT inhaler Inhale 2 puffs into the lungs every 4 (four) hours as needed. Reported on 06/12/2016    . escitalopram (LEXAPRO) 10 MG tablet Take 1 tablet (10 mg total) by mouth at bedtime. (Patient not taking: Reported on 07/06/2016) 30 tablet 1  . polyethylene glycol (MIRALAX / GLYCOLAX) packet Take 17 g by mouth daily.     No current facility-administered medications for this visit.       Physical Exam:   BP (!) 105/57   Pulse (!) 47   Resp 16   Ht 5\' 6"  (1.676 m)   Wt 233 lb (105.7 kg)   SpO2 96% Comment: ON RA  BMI 37.61 kg/m   General:  Obese but well appearing  Chest:   Clear to auscultation  CV:   Regular rate and rhythm without murmur  Incisions:  Well-healed, sternum is stable  Abdomen:  Soft and nontender  Extremities:  Warm and well-perfused. There is moderate bilateral lower extremity edema with chronic erythema and hair loss with skin changes consistent with underlying severe chronic venous insufficiency. The patient has 1 or 2 small punctate wounds on the anterior surface of the left lower leg that are clean and without surrounding cellulitis.  Diagnostic Tests:  Transthoracic Echocardiography  Patient:    Margaret [name], Margaret [name] MR #:       [id number] Study Date: 05/19/2016 Gender:     F Age:         60 Height:     165.1 cm Weight:     105.5 kg BSA:        2.25 m^2 Pt. Status: Room:       4W04C   ADMITTING    Posey Pronto, Ankit Anil  ATTENDING    Posey Pronto, Ankit Anil  SONOGRAPHER  Johny Chess, RDCS, CCT  PERFORMING   [facility], Inpatient  ORDERING     Reino Bellis B  cc:  ------------------------------------------------------------------- LV EF: 60% -   65%  ------------------------------------------------------------------- History:   PMH:  Mitral valve disorder.  Congestive heart failure. Chronic obstructive pulmonary disease.  Risk factors:  Paroxysmal A-fib. Edema. Hypertension. Diabetes mellitus.  ------------------------------------------------------------------- Study Conclusions  - Left ventricle: The cavity size was normal. Wall thickness was   normal. Systolic function was normal. The estimated ejection   fraction was in  the range of 60% to 65%. Wall motion was normal;   there were no regional wall motion abnormalities. The study is   not technically sufficient to allow evaluation of LV diastolic   function. - Mitral valve: Mitral valve annuloplasty repair. Bordeline mild   mitral stenosis. Trivial MR. Pressure half-time: 178 ms. Mean   gradient (D): 4 mm Hg. Valve area by continuity equation (using   LVOT flow): 1.59 cm^2. - Left atrium: Moderately dilated. - Right ventricle: The cavity size was mildly dilated. Systolic   function is decreased. - Right atrium: The atrium was at the upper limits of normal in   size. - Tricuspid valve: There was mild regurgitation. - Pulmonary arteries: PA peak pressure: 33 mm Hg (S). - Inferior vena cava: The vessel was normal in size. The   respirophasic diameter changes were in the normal range (>= 50%),   consistent with normal central venous pressure.  Impressions:  - Compared to a prior study on 04/27/2016, the patient is s/p mitral   valve repair with annoloplasty. The repair is excellent with   borderline mild  stenosis and trivial regurgitation. LVEF is   60-65% with normal wall motion. RVSP is reduced to 33 mmHg.  Transthoracic echocardiography.  M-mode, complete 2D, spectral Doppler, and color Doppler.  Birthdate:  Patient birthdate: [birth year].  Age:  Patient is 60 yr old.  Sex:  Gender: female. BMI: 38.7 kg/m^2.  Blood pressure:     120/63  Patient status: Inpatient.  Study date:  Study date: 05/19/2016. Study time: 10:36 AM.  Location:  [facility].  -------------------------------------------------------------------  ------------------------------------------------------------------- Left ventricle:  The cavity size was normal. Wall thickness was normal. Systolic function was normal. The estimated ejection fraction was in the range of 60% to 65%. Wall motion was normal; there were no regional wall motion abnormalities. The study is not technically sufficient to allow evaluation of LV diastolic function.  ------------------------------------------------------------------- Aortic valve:   Structurally normal valve. Trileaflet. Cusp separation was normal.  Doppler:  Transvalvular velocity was within the normal range. There was no stenosis. There was no regurgitation.  ------------------------------------------------------------------- Aorta:  Aortic root: The aortic root was normal in size. Ascending aorta: The ascending aorta was normal in size.  ------------------------------------------------------------------- Mitral valve:  Mitral valve annuloplasty repair. Bordeline mild mitral stenosis. Trivial MR.  Doppler:     Indexed valve area by pressure half-time: 0.54 cm^2/m^2. Valve area by continuity equation (using LVOT flow): 1.59 cm^2. Indexed valve area by continuity equation (using LVOT flow): 0.71 cm^2/m^2.    Mean gradient (D): 4 mm Hg. Peak gradient (D): 8 mm Hg.  ------------------------------------------------------------------- Left atrium:  Moderately  dilated.  ------------------------------------------------------------------- Atrial septum:  No defect or patent foramen ovale was identified.   ------------------------------------------------------------------- Right ventricle:  The cavity size was mildly dilated. Systolic function is decreased.  ------------------------------------------------------------------- Pulmonic valve:    The valve appears to be grossly normal. Doppler:  There was trivial regurgitation.  ------------------------------------------------------------------- Tricuspid valve:   Doppler:  There was mild regurgitation.  ------------------------------------------------------------------- Pulmonary artery:   The main pulmonary artery was normal-sized.  ------------------------------------------------------------------- Right atrium:  The atrium was at the upper limits of normal in size.  ------------------------------------------------------------------- Pericardium:  There was no pericardial effusion.  ------------------------------------------------------------------- Systemic veins: Inferior vena cava: The vessel was normal in size. The respirophasic diameter changes were in the normal range (>= 50%), consistent with normal central venous pressure.  ------------------------------------------------------------------- Measurements   Left ventricle  Value          Reference  LV ID, ED, PLAX chordal           (H)     53.3  mm       43 - 52  LV ID, ES, PLAX chordal                   33.3  mm       23 - 38  LV fx shortening, PLAX chordal            38    %        >=29  LV PW thickness, ED                       10.9  mm       ---------  IVS/LV PW ratio, ED                       0.73           <=1.3  Stroke volume, 2D                         116   ml       ---------  Stroke volume/bsa, 2D                     52    ml/m^2   ---------    Ventricular septum                         Value          Reference  IVS thickness, ED                         8.01  mm       ---------    LVOT                                      Value          Reference  LVOT ID, S                                21    mm       ---------  LVOT area                                 3.46  cm^2     ---------  LVOT peak velocity, S                     131   cm/s     ---------  LVOT mean velocity, S                     93.7  cm/s     ---------  LVOT VTI, S                               33.4  cm       ---------  LVOT peak gradient, S  7     mm Hg    ---------    Aorta                                     Value          Reference  Aortic root ID, ED                        28    mm       ---------    Left atrium                               Value          Reference  LA ID, A-P, ES                            43    mm       ---------  LA ID/bsa, A-P                            1.91  cm/m^2   <=2.2  LA volume, S                              80.3  ml       ---------  LA volume/bsa, S                          35.7  ml/m^2   ---------  LA volume, ES, 1-p A4C                    71    ml       ---------  LA volume/bsa, ES, 1-p A4C                31.6  ml/m^2   ---------  LA volume, ES, 1-p A2C                    83.7  ml       ---------  LA volume/bsa, ES, 1-p A2C                37.2  ml/m^2   ---------    Mitral valve                              Value          Reference  Mitral E-wave peak velocity               145   cm/s     ---------  Mitral A-wave peak velocity               81.2  cm/s     ---------  Mitral mean velocity, D                   91.7  cm/s     ---------  Mitral deceleration time          (H)     491   ms       150 - 230  Mitral pressure half-time  178   ms       ---------  Mitral mean gradient, D                   4     mm Hg    ---------  Mitral peak gradient, D                   8     mm Hg    ---------  Mitral E/A ratio, peak                    1.8             ---------  Mitral valve area/bsa, PHT, DP            0.54  cm^2/m^2 ---------  Mitral valve area, LVOT                   1.59  cm^2     ---------  continuity  Mitral valve area/bsa, LVOT               0.71  cm^2/m^2 ---------  continuity  Mitral annulus VTI, D                     72.8  cm       ---------    Pulmonary arteries                        Value          Reference  PA pressure, S, DP                (H)     33    mm Hg    <=30    Tricuspid valve                           Value          Reference  Tricuspid regurg peak velocity            274   cm/s     ---------  Tricuspid peak RV-RA gradient             30    mm Hg    ---------    Systemic veins                            Value          Reference  Estimated CVP                             3     mm Hg    ---------    Right ventricle                           Value          Reference  TAPSE                                     10.2  mm       ---------  RV pressure, S, DP                (H)     33  mm Hg    <=30  RV s&', lateral, S                         8.8   cm/s     ---------    Pulmonic valve                            Value          Reference  Pulmonic regurg velocity, ED              155   cm/s     ---------  Pulmonic regurg gradient, ED              10    mm Hg    ---------  Legend: (L)  and  (H)  mark values outside specified reference range.  ------------------------------------------------------------------- Prepared and Electronically Authenticated by  Lyman Bishop MD 2017-06-27T13:24:33    CHEST  2 VIEW  COMPARISON:  06/10/2016  FINDINGS: Prior median sternotomy and valve repair. Cardiomegaly. No confluent opacities or effusions. No acute bony abnormality. Degenerative changes in the thoracic spine.  IMPRESSION: Cardiomegaly.  No active disease.   Electronically Signed   By: Rolm Baptise M.D.   On: 07/06/2016 10:30   Impression:  Patient is doing quite well approximately  2 months status post mitral valve repair. She has been maintaining sinus rhythm. Follow-up echocardiogram performed the end of June looks quite good with normal left ventricular function and intact mitral valve repair. The patient does have problems with severe chronic venous insufficiency and recently was treated with antibiotics for an ulcer on her left lower leg.  Plan:  I have instructed the patient to stop taking amiodarone. She has also been instructed to notify the Coumadin clinic that she is no longer taking amiodarone. We have otherwise not changed any of her current medications. She will continue to follow-up with Dr. Meda Coffee. We will refer her to Dr. Scot Dock at [facility] to see whether she might benefit from any sort of mechanical support for management of her chronic venous insufficiency such as compression stockings. The patient will return to our office for routine follow-up next June, approximately 1 year following her surgery. Because the patient's mitral valve was repaired she does not have any absolute indications for long-term anticoagulation using warfarin. Moreover, her original stroke and splenic infarct occurred in the setting of active bacterial endocarditis without anything to suggest the presence of a thrombotic embolic event.    Margaret Gu. Roxy Manns, MD 07/06/2016 10:52 AM

## 2016-07-06 NOTE — Patient Instructions (Addendum)
Stop taking amiodarone  Continue all other previous medications without any changes at this time  Continue to avoid any heavy lifting or strenuous use of your arms or shoulders for at least a total of three months from the time of surgery.  After three months you may gradually increase how much you lift or otherwise use your arms or chest as tolerated, with limits based upon whether or not activities lead to the return of significant discomfort.  Endocarditis is a potentially serious infection of heart valves or inside lining of the heart.  It occurs more commonly in patients with diseased heart valves (such as patient's with aortic or mitral valve disease) and in patients who have undergone heart valve repair or replacement.  Certain surgical and dental procedures may put you at risk, such as dental cleaning, other dental procedures, or any surgery involving the respiratory, urinary, gastrointestinal tract, gallbladder or prostate gland.   To minimize your chances for develooping endocarditis, maintain good oral health and seek prompt medical attention for any infections involving the mouth, teeth, gums, skin or urinary tract.    Always notify your doctor or dentist about your underlying heart valve condition before having any invasive procedures. You will need to take antibiotics before certain procedures, including all routine dental cleanings or other dental procedures.  Your cardiologist or dentist should prescribe these antibiotics for you to be taken ahead of time.

## 2016-07-13 ENCOUNTER — Ambulatory Visit (INDEPENDENT_AMBULATORY_CARE_PROVIDER_SITE_OTHER): Payer: Medicaid Other | Admitting: Pharmacist

## 2016-07-13 DIAGNOSIS — Z9889 Other specified postprocedural states: Secondary | ICD-10-CM | POA: Diagnosis not present

## 2016-07-13 DIAGNOSIS — I6930 Unspecified sequelae of cerebral infarction: Secondary | ICD-10-CM | POA: Diagnosis not present

## 2016-07-13 DIAGNOSIS — I480 Paroxysmal atrial fibrillation: Secondary | ICD-10-CM | POA: Diagnosis not present

## 2016-07-13 DIAGNOSIS — Z5181 Encounter for therapeutic drug level monitoring: Secondary | ICD-10-CM

## 2016-07-13 LAB — POCT INR: INR: 1.1

## 2016-07-13 IMAGING — DX DG CHEST 1V
1 series · 1 of 1 positions shown · non-contrast
Comparison: 02/08/2016

CLINICAL DATA: Congestive heart failure

EXAM:
CHEST 1 VIEW

[chest pa]
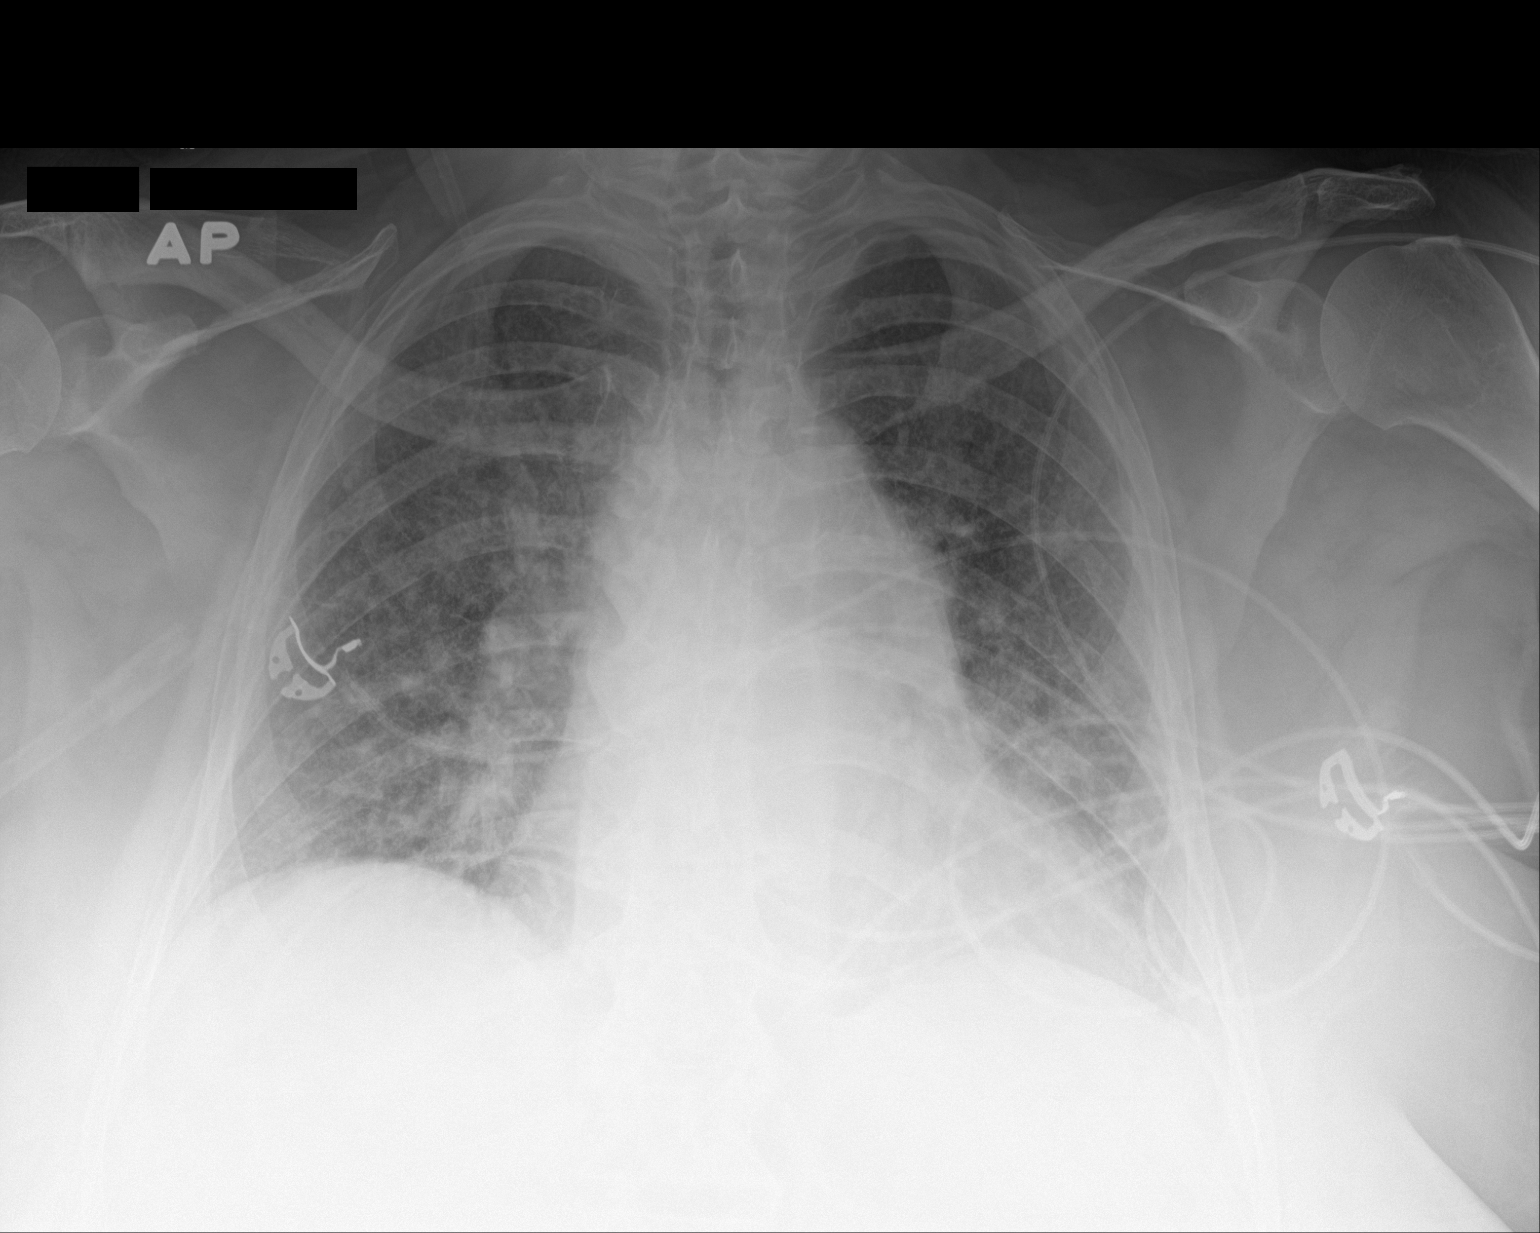

[1 of 1 positions shown; findings below may reference images not displayed]

FINDINGS: Stable cardiac enlargement. Vascular congestion is stable. There is
mild diffuse interstitial prominence with peribronchial wall
thickening. No pleural effusion.
IMPRESSION: Congestive heart failure with mild pulmonary edema

## 2016-07-15 IMAGING — XA IR FLUORO GUIDE CV LINE*R*
1 series · 1 of 1 positions shown · non-contrast
Comparison: none

INDICATION: Poor venous access. Request made for placement of a tunneled central
venous catheter for antibiotic administration.

[Series 300: dsa body · 1 of 1 slices shown]
[im 1/1]
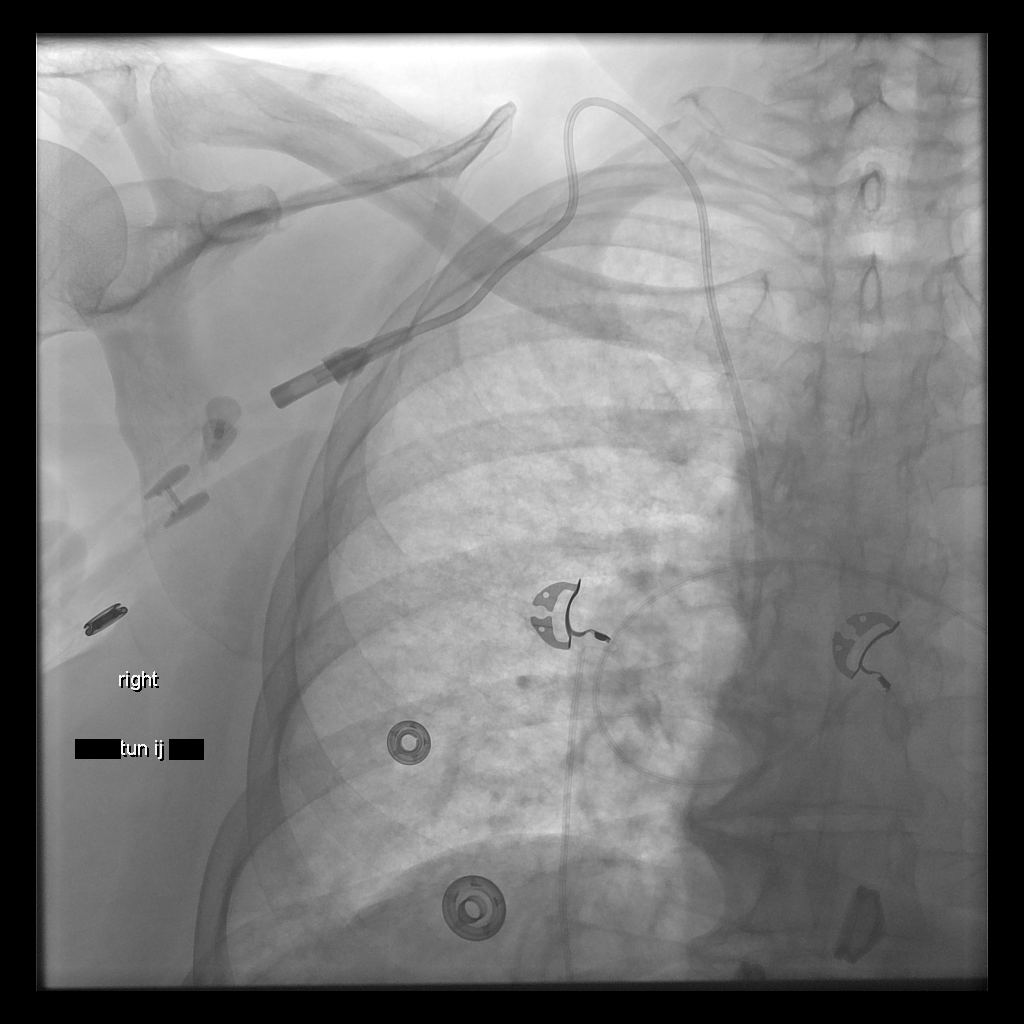

[1 of 1 positions shown; findings below may reference images not displayed]

EXAM:
TUNNELED PICC PLACEMENT WITH ULTRASOUND AND FLUOROSCOPIC GUIDANCE

MEDICATIONS:
The patient is currently admitted to the hospital and receiving
intravenous antibiotics. The antibiotic was given in an appropriate
time interval prior to skin puncture.

ANESTHESIA/SEDATION:
Moderate (conscious) sedation was employed during this procedure. A
total of Versed 1 mg was administered intravenously.

Moderate Sedation Time: 18 minutes. [name] level of
consciousness and vital signs were monitored continuously by
radiology nursing throughout the procedure under my direct
supervision.

FLUOROSCOPY TIME:  Fluoroscopy Time: 42 seconds (3.6 mGy).

COMPLICATIONS:
None immediate.



After creating a small venotomy incision, a micropuncture kit was
utilized to access the right internal jugular vein under direct,
real-time ultrasound guidance after the overlying soft tissues were
anesthetized with 1% lidocaine with epinephrine. Ultrasound image
documentation was performed. The microwire was kinked to measure
appropriate catheter length. A stiff Glidewire was advanced to the
level of the IVC and the micropuncture sheath was exchanged for a
peel-away sheath. A tunneled dual-lumen catheter measuring 23 cm
from tip to cuff was tunneled in a retrograde fashion from the
anterior chest wall to the venotomy incision.

The catheter was then placed through the peel-away sheath with tips
ultimately positioned within the superior caval atrial junction.
Final catheter positioning was confirmed and documented with a spot
radiographic image. The catheter aspirates and flushes normally. The
catheter was flushed with appropriate volume heparin dwells.

The catheter exit site was secured with a 0-Prolene retention
suture. The venotomy incision was closed with an interrupted 4-0
Vicryl, Dermabond and Erwin. Dressings were applied. The
patient tolerated the procedure well without immediate post
procedural complication.
IMPRESSION: Successful placement of 23 cm tip to cuff tunneled dual-lumen PICC
via the right internal jugular vein with tips terminating within the
superior caval atrial junction. The catheter is ready for immediate
use.

## 2016-07-17 ENCOUNTER — Telehealth: Payer: Self-pay | Admitting: Physical Medicine & Rehabilitation

## 2016-07-17 NOTE — Telephone Encounter (Signed)
Margaret [name] with [facility] needs to get a verbal for one more visit with the patient.  Please call her at [phone].

## 2016-07-17 NOTE — Telephone Encounter (Signed)
Approval given

## 2016-07-20 ENCOUNTER — Ambulatory Visit (INDEPENDENT_AMBULATORY_CARE_PROVIDER_SITE_OTHER): Payer: Medicaid Other | Admitting: *Deleted

## 2016-07-20 DIAGNOSIS — Z5181 Encounter for therapeutic drug level monitoring: Secondary | ICD-10-CM

## 2016-07-20 DIAGNOSIS — I6930 Unspecified sequelae of cerebral infarction: Secondary | ICD-10-CM

## 2016-07-20 DIAGNOSIS — Z9889 Other specified postprocedural states: Secondary | ICD-10-CM

## 2016-07-20 DIAGNOSIS — I480 Paroxysmal atrial fibrillation: Secondary | ICD-10-CM

## 2016-07-20 LAB — POCT INR: INR: 1.3

## 2016-07-24 ENCOUNTER — Ambulatory Visit: Payer: Self-pay | Admitting: Hematology and Oncology

## 2016-07-24 ENCOUNTER — Other Ambulatory Visit: Payer: Self-pay

## 2016-07-29 ENCOUNTER — Other Ambulatory Visit: Payer: Self-pay | Admitting: Vascular Surgery

## 2016-07-29 ENCOUNTER — Telehealth: Payer: Self-pay | Admitting: Hematology and Oncology

## 2016-07-29 DIAGNOSIS — R609 Edema, unspecified: Secondary | ICD-10-CM

## 2016-07-29 NOTE — Telephone Encounter (Signed)
07/31/2016 Appointment canceled per patient/ husband request. Patient's husband, Callaghan Ara, requested that the 09/08 appointment be canceled with no intent to reschedule. Mr. Cifarelli did not understand why the appointment was scheduled and did not want to talk further about the appointment with the nurse.

## 2016-07-31 ENCOUNTER — Ambulatory Visit: Payer: Self-pay | Admitting: Hematology and Oncology

## 2016-07-31 ENCOUNTER — Other Ambulatory Visit: Payer: Self-pay

## 2016-09-09 ENCOUNTER — Encounter: Payer: Self-pay | Admitting: Vascular Surgery

## 2016-09-09 ENCOUNTER — Encounter (HOSPITAL_COMMUNITY): Payer: Self-pay

## 2016-09-11 ENCOUNTER — Encounter: Payer: Medicaid Other | Admitting: Physical Medicine & Rehabilitation

## 2016-11-02 ENCOUNTER — Ambulatory Visit: Payer: Medicaid Other | Admitting: Neurology

## 2016-11-03 ENCOUNTER — Encounter: Payer: Self-pay | Admitting: Neurology

## 2016-12-05 IMAGING — CR DG CHEST 2V
1 series · 1 of 1 positions shown · non-contrast
Comparison: 06/10/2016

CLINICAL DATA: Status post mitral valve repair.

EXAM:
CHEST  2 VIEW

[view not recorded]
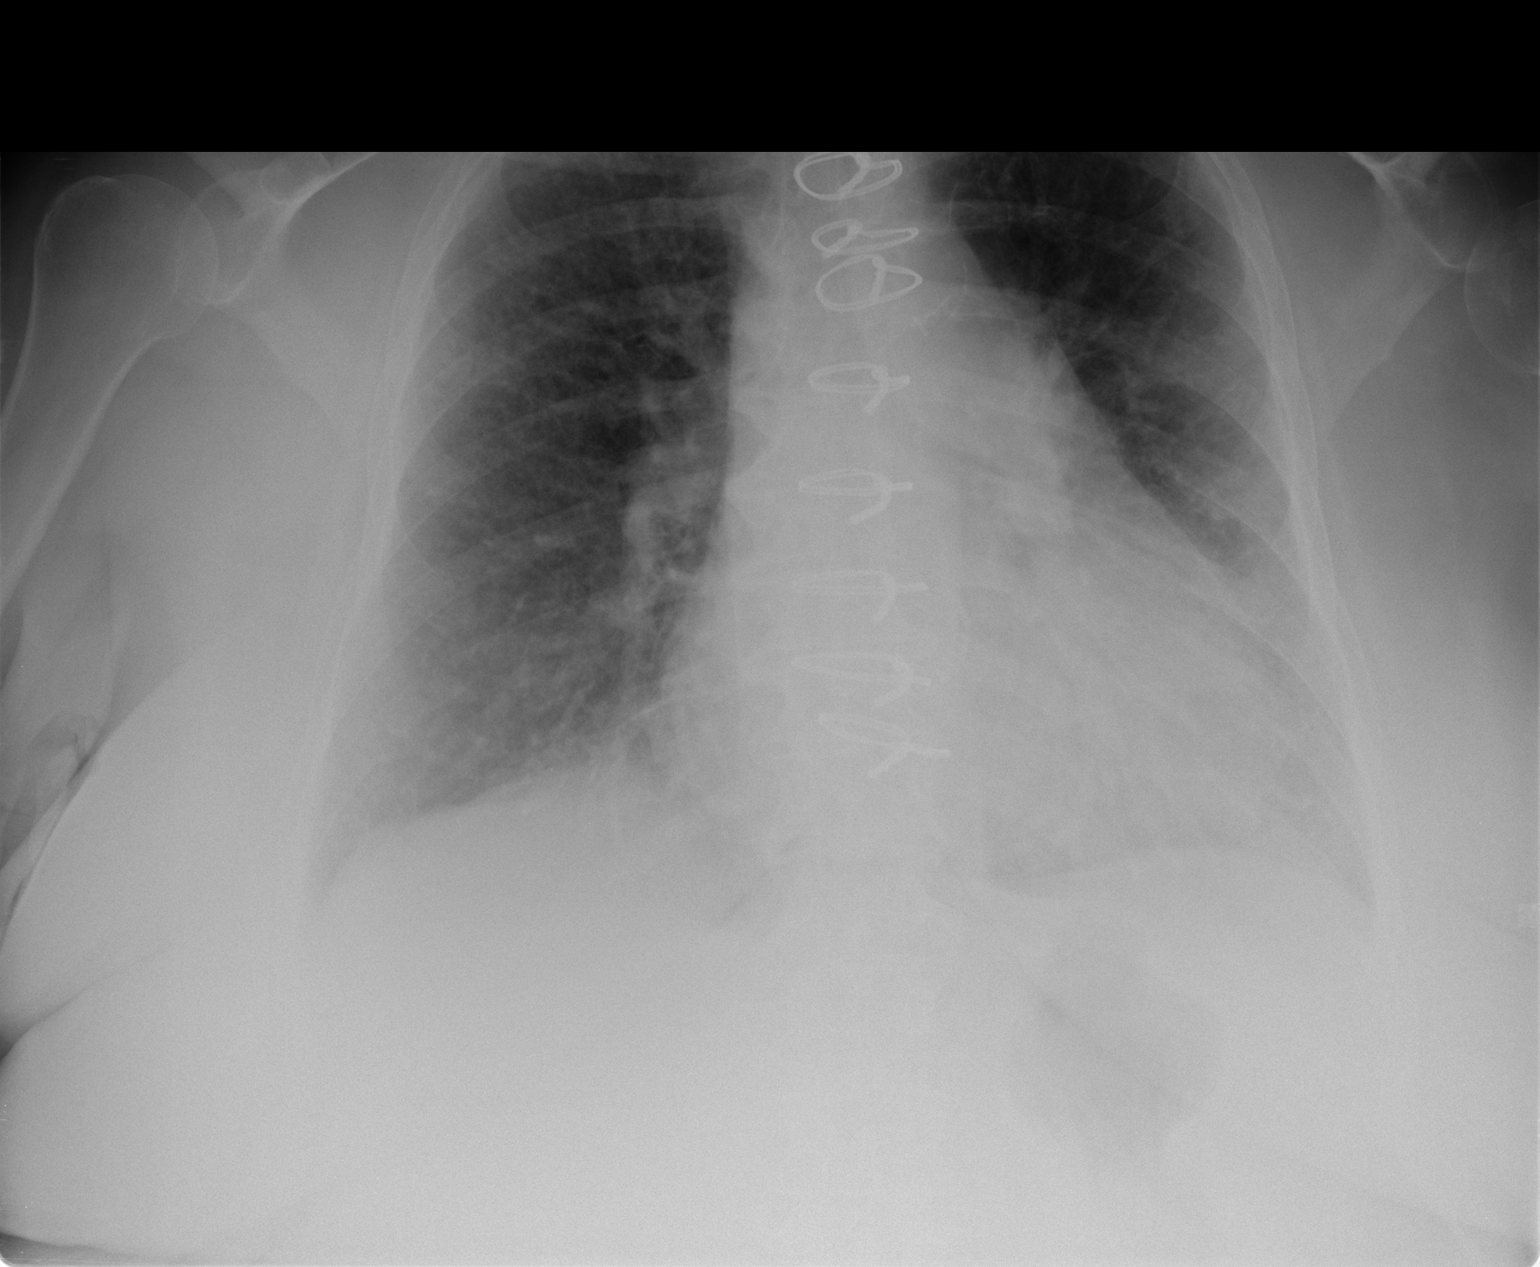

[1 of 1 positions shown; findings below may reference images not displayed]

FINDINGS: Prior median sternotomy and valve repair. Cardiomegaly. No confluent
opacities or effusions. No acute bony abnormality. Degenerative
changes in the thoracic spine.
IMPRESSION: Cardiomegaly.  No active disease.

## 2017-05-10 ENCOUNTER — Encounter: Payer: Self-pay | Admitting: Thoracic Surgery (Cardiothoracic Vascular Surgery)

## 2017-05-31 ENCOUNTER — Ambulatory Visit (INDEPENDENT_AMBULATORY_CARE_PROVIDER_SITE_OTHER): Payer: Medicare (Managed Care) | Admitting: Thoracic Surgery (Cardiothoracic Vascular Surgery)

## 2017-05-31 ENCOUNTER — Encounter: Payer: Self-pay | Admitting: Thoracic Surgery (Cardiothoracic Vascular Surgery)

## 2017-05-31 VITALS — BP 112/70 | HR 77 | Resp 20 | Ht 66.0 in | Wt 284.0 lb

## 2017-05-31 DIAGNOSIS — I059 Rheumatic mitral valve disease, unspecified: Secondary | ICD-10-CM

## 2017-05-31 DIAGNOSIS — I5032 Chronic diastolic (congestive) heart failure: Secondary | ICD-10-CM | POA: Diagnosis not present

## 2017-05-31 DIAGNOSIS — I340 Nonrheumatic mitral (valve) insufficiency: Secondary | ICD-10-CM

## 2017-05-31 DIAGNOSIS — Z9889 Other specified postprocedural states: Secondary | ICD-10-CM | POA: Diagnosis not present

## 2017-05-31 DIAGNOSIS — I058 Other rheumatic mitral valve diseases: Secondary | ICD-10-CM

## 2017-05-31 NOTE — Progress Notes (Signed)
[address]       [place] [address]             [phone]     CARDIOTHORACIC SURGERY OFFICE NOTE  Referring Provider is Dorothy Spark, MD PCP is System, Pcp Not In   HPI:  Patient is a 61 year old female with complex past medical history who returns to the office today for routine follow-up status post mitral valve repair on 04/30/2016 for severe mitral regurgitation secondary to bacterial endocarditis.  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 She suffered a massive stroke and was eventually discharged to a local skilled nursing facility. She completed a six-week course of intravenous antibiotics and slowly recovered although she continued to suffer from problems with acute exacerbations of congestive heart failure. She was ultimately hospitalized in May of 2017 and her congestive heart failure tuned up medically prior to surgery.  Following mitral valve repair the patient did remarkably well.  She developed paroxysmal atrial fibrillation for which she was treated with amiodarone. She converted back to sinus rhythm and was discharged to the inpatient rehabilitation service on the 11th postoperative day. Routine follow-up echocardiogram performed 05/19/2016 revealed intact mitral valve repair with no mitral regurgitation and normal left ventricular systolic function.  She was last seen here in our office on 07/06/2016 at which time she was doing exceptionally well.  She returns to our office for routine follow-up today. She currently lives at [facility] in [place] where she is enrolled in the [facility] program and followed by Dr. Amelia Jo.  She has not been seen in follow-up by Dr. Meda Coffee and to her knowledge she has not been seen by a cardiologist. She has not had a follow-up echocardiogram performed.  She remains chronically anticoagulated using warfarin. She reports that she is doing exceptionally well overall. She denies any problems with exertional chest pain or chest tightness. She states that her breathing is much better than it has been in years. She still gets some shortness of breath with activity, but this occurs only with more strenuous activity and it does not seem to limit her. 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 She has even started walking using a walker. She participates in physical therapy 5 days a week. Overall she looks and feels remarkably well under the circumstances.   Current Outpatient Prescriptions  Medication Sig Dispense Refill  . albuterol (PROVENTIL HFA;VENTOLIN HFA) 108 (90 BASE) MCG/ACT inhaler Inhale 2 puffs into the lungs every 4 (four) hours as needed. Reported on 06/12/2016    . ALPRAZolam (XANAX) 0.5 MG tablet Take 0.5 mg by mouth 3 (three) times daily as needed for anxiety. Reported on 06/12/2016    . baclofen (LIORESAL) 10 MG tablet Take 0.5 tablets (5 mg total) by mouth 3 (three) times daily. 90 tablet 1  . carvedilol (COREG) 3.125 MG tablet Take 1 tablet (3.125 mg total) by mouth 2 (two) times daily with a meal. 60 tablet 1  . cholecalciferol (VITAMIN D) 1000 units tablet Take 1,000 Units by mouth daily.     . clonazePAM (KLONOPIN) 0.5 MG tablet Take 1 tablet (0.5 mg total) by mouth at bedtime. 30 tablet 0  . escitalopram (LEXAPRO) 10 MG tablet Take 1 tablet (10 mg total) by mouth at  bedtime. 30 tablet 1  . famotidine (PEPCID) 20 MG tablet Take 1 tablet (20 mg total) by mouth 2 (two) times daily. 60 tablet 1  . furosemide (LASIX) 40 MG tablet Take 1 tablet (40 mg total) by mouth daily. 60 tablet 1  . insulin detemir (LEVEMIR) 100 UNIT/ML injection Inject 0.45 mLs (45 Units total) into the skin daily. 10 mL 11  . lovastatin (MEVACOR) 10 MG tablet Take 10 mg by mouth  at bedtime.    . nitroGLYCERIN (NITROSTAT) 0.4 MG SL tablet Place 1 tablet (0.4 mg total) under the tongue every 5 (five) minutes x 3 doses as needed for chest pain. 25 tablet 3  . nystatin (MYCOSTATIN/NYSTOP) powder Apply topically 2 (two) times daily. 15 g 0  . polyethylene glycol (MIRALAX / GLYCOLAX) packet Take 17 g by mouth daily.    . potassium chloride (KLOR-CON) 20 MEQ packet Take 20 mEq by mouth daily. 60 tablet   . spironolactone (ALDACTONE) 25 MG tablet Take 2 tablets (50 mg total) by mouth daily. Reported on 04/09/2016 30 tablet 1  . traMADol (ULTRAM) 50 MG tablet Take 50 mg by mouth every 6 (six) hours as needed for moderate pain.     . traZODone (DESYREL) 100 MG tablet Take 1 tablet (100 mg total) by mouth at bedtime. 30 tablet 0  . warfarin (COUMADIN) 5 MG tablet Take 1 tablet (5 mg total) by mouth daily at 6 PM. (Patient taking differently: Take 5 mg by mouth daily at 6 PM. Take 1.5 tablets on Friday, 1 tablet every day other than Friday) 30 tablet 1   No current facility-administered medications for this visit.       Physical Exam:   BP 112/70   Pulse 77   Resp 20   Ht 5\' 6"  (1.676 m)   Wt 284 lb (128.8 kg)   SpO2 95% Comment: RA  BMI 45.84 kg/m   General:  Obese but well appearing  Chest:   Clear to auscultation with symmetrical breath sounds  CV:   Regular rate and rhythm without murmur  Incisions:  Completely healed  Abdomen:  Soft nontender  Extremities:  Warm and well-perfused with moderate lower extremity edema  Diagnostic Tests:  n/a   Impression:  Patient appears to be doing exceptionally well approximately one year status post mitral valve repair for bacterial endocarditis complicated by congestive heart failure and septic embolization to the brain and spleen.  Plan:  We have not recommended any changes to the patient's current medications. The patient was anticoagulated using warfarin following surgery per routine for all patients who undergo mitral  valve repair. She did experience some postoperative atrial fibrillation early after surgery but to my knowledge she has not had a recurrence. She was not anticoagulated using warfarin prior to mitral valve repair, and to my knowledge she has not had a recurrence of atrial fibrillation.  She had a follow-up echocardiogram in June 2017 looked good with intact mitral valve repair. She should probably have follow-up echocardiograms performed at least every other year if not annually. Similarly, I would recommend that she see a cardiologist on an annual basis.  I have discussed all these matters at length with the patient and her friend in the office today.  In addition, the patient has been reminded regarding the importance of dental hygiene and the lifelong need for antibiotic prophylaxis for all dental cleanings and other related invasive procedures.  In the future she will call and return to see [place] only  should the cervix problems or questions arise.   Valentina Gu. Roxy Manns, MD 05/31/2017 4:40 PM

## 2017-05-31 NOTE — Patient Instructions (Addendum)
Continue all previous medications without any changes at this time.   Discuss with your cardiologist whether or not you remain on Coumadin (warfarin) as a blood thinner.  Endocarditis is a potentially serious infection of heart valves or inside lining of the heart.  It occurs more commonly in patients with diseased heart valves (such as patient's with aortic or mitral valve disease) and in patients who have undergone heart valve repair or replacement.  Certain surgical and dental procedures may put you at risk, such as dental cleaning, other dental procedures, or any surgery involving the respiratory, urinary, gastrointestinal tract, gallbladder or prostate gland.   To minimize your chances for develooping endocarditis, maintain good oral health and seek prompt medical attention for any infections involving the mouth, teeth, gums, skin or urinary tract.    Always notify your doctor or dentist about your underlying heart valve condition before having any invasive procedures. You will need to take antibiotics before certain procedures, including all routine dental cleanings or other dental procedures.  Your cardiologist or dentist should prescribe these antibiotics for you to be taken ahead of time.

## 2017-06-07 DIAGNOSIS — I5032 Chronic diastolic (congestive) heart failure: Secondary | ICD-10-CM | POA: Diagnosis not present

## 2017-08-05 DIAGNOSIS — N289 Disorder of kidney and ureter, unspecified: Secondary | ICD-10-CM | POA: Diagnosis not present

## 2017-08-05 DIAGNOSIS — I2699 Other pulmonary embolism without acute cor pulmonale: Secondary | ICD-10-CM | POA: Diagnosis not present

## 2017-08-05 DIAGNOSIS — Z8673 Personal history of transient ischemic attack (TIA), and cerebral infarction without residual deficits: Secondary | ICD-10-CM | POA: Diagnosis not present

## 2017-08-05 DIAGNOSIS — R7989 Other specified abnormal findings of blood chemistry: Secondary | ICD-10-CM

## 2017-08-06 DIAGNOSIS — N289 Disorder of kidney and ureter, unspecified: Secondary | ICD-10-CM | POA: Diagnosis not present

## 2017-08-06 DIAGNOSIS — I2699 Other pulmonary embolism without acute cor pulmonale: Secondary | ICD-10-CM | POA: Diagnosis not present

## 2017-08-06 DIAGNOSIS — Z8673 Personal history of transient ischemic attack (TIA), and cerebral infarction without residual deficits: Secondary | ICD-10-CM | POA: Diagnosis not present

## 2017-08-06 DIAGNOSIS — R7989 Other specified abnormal findings of blood chemistry: Secondary | ICD-10-CM | POA: Diagnosis not present

## 2017-08-06 DIAGNOSIS — R0602 Shortness of breath: Secondary | ICD-10-CM

## 2017-08-07 DIAGNOSIS — R7989 Other specified abnormal findings of blood chemistry: Secondary | ICD-10-CM | POA: Diagnosis not present

## 2017-08-07 DIAGNOSIS — Z8673 Personal history of transient ischemic attack (TIA), and cerebral infarction without residual deficits: Secondary | ICD-10-CM | POA: Diagnosis not present

## 2017-08-07 DIAGNOSIS — N289 Disorder of kidney and ureter, unspecified: Secondary | ICD-10-CM | POA: Diagnosis not present

## 2017-08-07 DIAGNOSIS — I2699 Other pulmonary embolism without acute cor pulmonale: Secondary | ICD-10-CM | POA: Diagnosis not present

## 2018-06-21 DIAGNOSIS — Z01818 Encounter for other preprocedural examination: Secondary | ICD-10-CM

## 2020-11-14 ENCOUNTER — Other Ambulatory Visit: Payer: Self-pay

## 2020-11-14 ENCOUNTER — Emergency Department (HOSPITAL_COMMUNITY): Payer: No Typology Code available for payment source

## 2020-11-14 ENCOUNTER — Encounter (HOSPITAL_COMMUNITY): Payer: Self-pay | Admitting: Radiology

## 2020-11-14 ENCOUNTER — Inpatient Hospital Stay (HOSPITAL_COMMUNITY)
Admission: EM | Admit: 2020-11-14 | Discharge: 2020-11-28 | DRG: 481 | Disposition: A | Discharge status: Skilled Nursing Facility | Payer: No Typology Code available for payment source | Attending: Internal Medicine | Admitting: Internal Medicine

## 2020-11-14 DIAGNOSIS — Y92009 Unspecified place in unspecified non-institutional (private) residence as the place of occurrence of the external cause: Secondary | ICD-10-CM | POA: Diagnosis not present

## 2020-11-14 DIAGNOSIS — D75839 Thrombocytosis, unspecified: Secondary | ICD-10-CM | POA: Diagnosis present

## 2020-11-14 DIAGNOSIS — Z20822 Contact with and (suspected) exposure to covid-19: Secondary | ICD-10-CM | POA: Diagnosis present

## 2020-11-14 DIAGNOSIS — I5032 Chronic diastolic (congestive) heart failure: Secondary | ICD-10-CM | POA: Diagnosis present

## 2020-11-14 DIAGNOSIS — K219 Gastro-esophageal reflux disease without esophagitis: Secondary | ICD-10-CM | POA: Diagnosis present

## 2020-11-14 DIAGNOSIS — N179 Acute kidney failure, unspecified: Secondary | ICD-10-CM | POA: Diagnosis not present

## 2020-11-14 DIAGNOSIS — Z825 Family history of asthma and other chronic lower respiratory diseases: Secondary | ICD-10-CM

## 2020-11-14 DIAGNOSIS — I878 Other specified disorders of veins: Secondary | ICD-10-CM | POA: Diagnosis present

## 2020-11-14 DIAGNOSIS — J449 Chronic obstructive pulmonary disease, unspecified: Secondary | ICD-10-CM | POA: Diagnosis present

## 2020-11-14 DIAGNOSIS — I130 Hypertensive heart and chronic kidney disease with heart failure and stage 1 through stage 4 chronic kidney disease, or unspecified chronic kidney disease: Secondary | ICD-10-CM | POA: Diagnosis present

## 2020-11-14 DIAGNOSIS — F411 Generalized anxiety disorder: Secondary | ICD-10-CM | POA: Diagnosis present

## 2020-11-14 DIAGNOSIS — K449 Diaphragmatic hernia without obstruction or gangrene: Secondary | ICD-10-CM | POA: Diagnosis present

## 2020-11-14 DIAGNOSIS — I6930 Unspecified sequelae of cerebral infarction: Secondary | ICD-10-CM | POA: Diagnosis not present

## 2020-11-14 DIAGNOSIS — Z79891 Long term (current) use of opiate analgesic: Secondary | ICD-10-CM

## 2020-11-14 DIAGNOSIS — Z87891 Personal history of nicotine dependence: Secondary | ICD-10-CM

## 2020-11-14 DIAGNOSIS — D631 Anemia in chronic kidney disease: Secondary | ICD-10-CM | POA: Diagnosis present

## 2020-11-14 DIAGNOSIS — S72142A Displaced intertrochanteric fracture of left femur, initial encounter for closed fracture: Secondary | ICD-10-CM | POA: Diagnosis present

## 2020-11-14 DIAGNOSIS — I10 Essential (primary) hypertension: Secondary | ICD-10-CM | POA: Diagnosis not present

## 2020-11-14 DIAGNOSIS — E1165 Type 2 diabetes mellitus with hyperglycemia: Secondary | ICD-10-CM | POA: Diagnosis present

## 2020-11-14 DIAGNOSIS — Z6841 Body Mass Index (BMI) 40.0 and over, adult: Secondary | ICD-10-CM

## 2020-11-14 DIAGNOSIS — E785 Hyperlipidemia, unspecified: Secondary | ICD-10-CM | POA: Diagnosis present

## 2020-11-14 DIAGNOSIS — N1831 Chronic kidney disease, stage 3a: Secondary | ICD-10-CM | POA: Diagnosis present

## 2020-11-14 DIAGNOSIS — I69322 Dysarthria following cerebral infarction: Secondary | ICD-10-CM

## 2020-11-14 DIAGNOSIS — D72829 Elevated white blood cell count, unspecified: Secondary | ICD-10-CM | POA: Diagnosis present

## 2020-11-14 DIAGNOSIS — E1122 Type 2 diabetes mellitus with diabetic chronic kidney disease: Secondary | ICD-10-CM | POA: Diagnosis present

## 2020-11-14 DIAGNOSIS — W19XXXA Unspecified fall, initial encounter: Secondary | ICD-10-CM

## 2020-11-14 DIAGNOSIS — Z22322 Carrier or suspected carrier of Methicillin resistant Staphylococcus aureus: Secondary | ICD-10-CM

## 2020-11-14 DIAGNOSIS — S7292XA Unspecified fracture of left femur, initial encounter for closed fracture: Secondary | ICD-10-CM | POA: Diagnosis present

## 2020-11-14 DIAGNOSIS — I480 Paroxysmal atrial fibrillation: Secondary | ICD-10-CM | POA: Diagnosis present

## 2020-11-14 DIAGNOSIS — I69354 Hemiplegia and hemiparesis following cerebral infarction affecting left non-dominant side: Secondary | ICD-10-CM

## 2020-11-14 DIAGNOSIS — E1142 Type 2 diabetes mellitus with diabetic polyneuropathy: Secondary | ICD-10-CM | POA: Diagnosis present

## 2020-11-14 DIAGNOSIS — M1990 Unspecified osteoarthritis, unspecified site: Secondary | ICD-10-CM | POA: Diagnosis present

## 2020-11-14 DIAGNOSIS — I272 Pulmonary hypertension, unspecified: Secondary | ICD-10-CM | POA: Diagnosis present

## 2020-11-14 DIAGNOSIS — Z885 Allergy status to narcotic agent status: Secondary | ICD-10-CM

## 2020-11-14 DIAGNOSIS — Z7901 Long term (current) use of anticoagulants: Secondary | ICD-10-CM

## 2020-11-14 DIAGNOSIS — G8918 Other acute postprocedural pain: Secondary | ICD-10-CM

## 2020-11-14 DIAGNOSIS — Z888 Allergy status to other drugs, medicaments and biological substances status: Secondary | ICD-10-CM

## 2020-11-14 DIAGNOSIS — J431 Panlobular emphysema: Secondary | ICD-10-CM | POA: Diagnosis not present

## 2020-11-14 DIAGNOSIS — Z794 Long term (current) use of insulin: Secondary | ICD-10-CM

## 2020-11-14 DIAGNOSIS — Z713 Dietary counseling and surveillance: Secondary | ICD-10-CM

## 2020-11-14 DIAGNOSIS — W010XXA Fall on same level from slipping, tripping and stumbling without subsequent striking against object, initial encounter: Secondary | ICD-10-CM | POA: Diagnosis present

## 2020-11-14 DIAGNOSIS — Z79899 Other long term (current) drug therapy: Secondary | ICD-10-CM

## 2020-11-14 DIAGNOSIS — Z91040 Latex allergy status: Secondary | ICD-10-CM

## 2020-11-14 DIAGNOSIS — Z823 Family history of stroke: Secondary | ICD-10-CM

## 2020-11-14 DIAGNOSIS — E782 Mixed hyperlipidemia: Secondary | ICD-10-CM

## 2020-11-14 DIAGNOSIS — E7800 Pure hypercholesterolemia, unspecified: Secondary | ICD-10-CM | POA: Diagnosis not present

## 2020-11-14 DIAGNOSIS — Z8249 Family history of ischemic heart disease and other diseases of the circulatory system: Secondary | ICD-10-CM

## 2020-11-14 LAB — CBC WITH DIFFERENTIAL/PLATELET
Abs Immature Granulocytes: 0.1 10*3/uL — ABNORMAL HIGH (ref 0.00–0.07)
Basophils Absolute: 0.1 10*3/uL (ref 0.0–0.1)
Basophils Relative: 1 %
Eosinophils Absolute: 0.1 10*3/uL (ref 0.0–0.5)
Eosinophils Relative: 1 %
HCT: 48.3 % — ABNORMAL HIGH (ref 36.0–46.0)
Hemoglobin: 14.8 g/dL (ref 12.0–15.0)
Immature Granulocytes: 1 %
Lymphocytes Relative: 12 %
Lymphs Abs: 1.7 10*3/uL (ref 0.7–4.0)
MCH: 27.2 pg (ref 26.0–34.0)
MCHC: 30.6 g/dL (ref 30.0–36.0)
MCV: 88.8 fL (ref 80.0–100.0)
Monocytes Absolute: 0.6 10*3/uL (ref 0.1–1.0)
Monocytes Relative: 4 %
Neutro Abs: 11.8 10*3/uL — ABNORMAL HIGH (ref 1.7–7.7)
Neutrophils Relative %: 81 %
Platelets: 417 10*3/uL — ABNORMAL HIGH (ref 150–400)
RBC: 5.44 MIL/uL — ABNORMAL HIGH (ref 3.87–5.11)
RDW: 16.1 % — ABNORMAL HIGH (ref 11.5–15.5)
WBC: 14.4 10*3/uL — ABNORMAL HIGH (ref 4.0–10.5)
nRBC: 0 % (ref 0.0–0.2)

## 2020-11-14 LAB — RESP PANEL BY RT-PCR (FLU A&B, COVID) ARPGX2
Influenza A by PCR: NEGATIVE
Influenza B by PCR: NEGATIVE
SARS Coronavirus 2 by RT PCR: NEGATIVE

## 2020-11-14 LAB — BASIC METABOLIC PANEL
Anion gap: 11 (ref 5–15)
BUN: 14 mg/dL (ref 8–23)
CO2: 24 mmol/L (ref 22–32)
Calcium: 9.4 mg/dL (ref 8.9–10.3)
Chloride: 100 mmol/L (ref 98–111)
Creatinine, Ser: 1.19 mg/dL — ABNORMAL HIGH (ref 0.44–1.00)
GFR, Estimated: 51 mL/min — ABNORMAL LOW (ref >60)
Glucose, Bld: 193 mg/dL — ABNORMAL HIGH (ref 70–99)
Potassium: 4.6 mmol/L (ref 3.5–5.1)
Sodium: 135 mmol/L (ref 135–145)

## 2020-11-14 LAB — HEMOGLOBIN A1C
Hgb A1c MFr Bld: 8.5 % — ABNORMAL HIGH (ref 4.8–5.6)
Mean Plasma Glucose: 197.25 mg/dL

## 2020-11-14 LAB — PROTIME-INR
INR: 1.2 (ref 0.8–1.2)
Prothrombin Time: 14.4 seconds (ref 11.4–15.2)

## 2020-11-14 LAB — GLUCOSE, CAPILLARY: Glucose-Capillary: 188 mg/dL — ABNORMAL HIGH (ref 70–99)

## 2020-11-14 MED ORDER — ONDANSETRON HCL 4 MG/2ML IJ SOLN
4.0000 mg | Freq: Once | INTRAMUSCULAR | Status: AC
  Administered 2020-11-14: 4 mg via INTRAVENOUS
  Filled 2020-11-14: qty 2

## 2020-11-14 MED ORDER — MORPHINE SULFATE (PF) 2 MG/ML IV SOLN
2.0000 mg | INTRAVENOUS | Status: DC | PRN
  Administered 2020-11-14 – 2020-11-17 (×9): 2 mg via INTRAVENOUS
  Filled 2020-11-14 (×9): qty 1

## 2020-11-14 MED ORDER — MORPHINE SULFATE (PF) 4 MG/ML IV SOLN
4.0000 mg | Freq: Once | INTRAVENOUS | Status: AC
  Administered 2020-11-14: 4 mg via INTRAVENOUS
  Filled 2020-11-14: qty 1

## 2020-11-14 MED ORDER — HYDROMORPHONE HCL 1 MG/ML IJ SOLN
1.0000 mg | Freq: Once | INTRAMUSCULAR | Status: AC
  Administered 2020-11-14: 18:00:00 1 mg via INTRAVENOUS
  Filled 2020-11-14: qty 1

## 2020-11-14 MED ORDER — INSULIN ASPART 100 UNIT/ML ~~LOC~~ SOLN
0.0000 [IU] | Freq: Every day | SUBCUTANEOUS | Status: DC
  Administered 2020-11-17: 22:00:00 4 [IU] via SUBCUTANEOUS
  Administered 2020-11-18 – 2020-11-21 (×3): 3 [IU] via SUBCUTANEOUS
  Administered 2020-11-22: 2 [IU] via SUBCUTANEOUS

## 2020-11-14 MED ORDER — MUPIROCIN 2 % EX OINT
1.0000 "application " | TOPICAL_OINTMENT | Freq: Two times a day (BID) | CUTANEOUS | Status: AC
  Administered 2020-11-14 – 2020-11-19 (×9): 1 via NASAL
  Filled 2020-11-14 (×2): qty 22

## 2020-11-14 MED ORDER — FENTANYL CITRATE (PF) 100 MCG/2ML IJ SOLN
50.0000 ug | Freq: Once | INTRAMUSCULAR | Status: AC
  Administered 2020-11-14: 50 ug via INTRAVENOUS
  Filled 2020-11-14: qty 2

## 2020-11-14 MED ORDER — INSULIN ASPART 100 UNIT/ML ~~LOC~~ SOLN
0.0000 [IU] | Freq: Three times a day (TID) | SUBCUTANEOUS | Status: DC
  Administered 2020-11-15: 13:00:00 3 [IU] via SUBCUTANEOUS
  Administered 2020-11-15: 09:00:00 2 [IU] via SUBCUTANEOUS
  Administered 2020-11-16: 13:00:00 5 [IU] via SUBCUTANEOUS
  Administered 2020-11-16: 17:00:00 3 [IU] via SUBCUTANEOUS
  Administered 2020-11-16: 08:00:00 2 [IU] via SUBCUTANEOUS
  Administered 2020-11-17: 18:00:00 8 [IU] via SUBCUTANEOUS
  Administered 2020-11-17 – 2020-11-19 (×4): 3 [IU] via SUBCUTANEOUS
  Administered 2020-11-19: 13:00:00 5 [IU] via SUBCUTANEOUS
  Administered 2020-11-19: 17:00:00 8 [IU] via SUBCUTANEOUS
  Administered 2020-11-20: 07:00:00 5 [IU] via SUBCUTANEOUS
  Administered 2020-11-20: 17:00:00 3 [IU] via SUBCUTANEOUS
  Administered 2020-11-20: 12:00:00 5 [IU] via SUBCUTANEOUS
  Administered 2020-11-21: 13:00:00 11 [IU] via SUBCUTANEOUS
  Administered 2020-11-21: 18:00:00 5 [IU] via SUBCUTANEOUS
  Administered 2020-11-21: 08:00:00 8 [IU] via SUBCUTANEOUS
  Administered 2020-11-22: 5 [IU] via SUBCUTANEOUS
  Administered 2020-11-22: 8 [IU] via SUBCUTANEOUS
  Administered 2020-11-22: 3 [IU] via SUBCUTANEOUS
  Administered 2020-11-23: 8 [IU] via SUBCUTANEOUS

## 2020-11-14 MED ORDER — HYDROMORPHONE HCL 1 MG/ML IJ SOLN
1.0000 mg | Freq: Once | INTRAMUSCULAR | Status: AC
  Administered 2020-11-14: 20:00:00 1 mg via INTRAVENOUS
  Filled 2020-11-14: qty 1

## 2020-11-14 MED ORDER — ONDANSETRON HCL 4 MG/2ML IJ SOLN
4.0000 mg | Freq: Four times a day (QID) | INTRAMUSCULAR | Status: DC | PRN
  Administered 2020-11-14: 21:00:00 4 mg via INTRAVENOUS
  Filled 2020-11-14: qty 2

## 2020-11-14 MED ORDER — FENTANYL CITRATE (PF) 100 MCG/2ML IJ SOLN
100.0000 ug | Freq: Once | INTRAMUSCULAR | Status: AC
  Administered 2020-11-14: 100 ug via INTRAVENOUS
  Filled 2020-11-14: qty 2

## 2020-11-14 NOTE — ED Notes (Signed)
Patient transported back from CT 

## 2020-11-14 NOTE — ED Notes (Signed)
Son at bedside.

## 2020-11-14 NOTE — ED Triage Notes (Signed)
"  tripped and fell", C/O left hip pain 10 on 1/10 scale. Leg with shortening.

## 2020-11-14 NOTE — ED Notes (Signed)
Patient transported to CT via stretcher in stable condition 

## 2020-11-14 NOTE — H&P (Signed)
History and Physical  Margaret [name] [id number] DOB: 26-Apr-1956 DOA: 11/14/2020  Referring physician: Nettie Elm, PA-C PCP: Patient, No Pcp Per  Patient coming from: Home  Chief Complaint: Left hip pain  HPI: Margaret [name] is a 64 y.o. female with medical history significant for CHF, COPD, obesity, pulmonary hypertension, valve repair, subarachnoid bleed, CVA, diabetes, endocarditis who presents to the emergency department after sustaining fall at home.  Patient has left sided weakness at baseline due to prior stroke and ambulates with a cane.  She states that she slipped and fell while at home, she does not think she hit her head.  Left hip pain was rated as 10/10 on pain scale without any aggravating or alleviating factors she denies headache, dizziness, chest pain, shortness of breath or abdominal pain.  ED Course:  In the emergency department, she was hemodynamically stable.  Work-up in the ED showed leukocytosis, thrombocytosis. CT head without contrast showed no evidence of acute intracranial abnormality CT cervical spine showed no evidence of acute fracture or traumatic malalignment but showed a moderate degenerative disc disease at C6-C7. Left hip x-ray showed intertrochanteric femur fracture on the left with varus angulation at the fracture site.  IV opioid medications were given.  Orthopedic surgery was consulted by ED team.  Hospitalist was asked to admit patient for further evaluation and management  Review of Systems: Constitutional: Negative for chills and fever.  HENT: Negative for ear pain and sore throat.   Eyes: Negative for pain and visual disturbance.  Respiratory: Negative for cough, chest tightness and shortness of breath.   Cardiovascular: Negative for chest pain and palpitations.  Gastrointestinal: Negative for abdominal pain and vomiting.  Endocrine: Negative for polyphagia and polyuria.  Genitourinary: Negative for decreased urine volume,  dysuria, enuresis, hematuria Musculoskeletal: Positive for left hip pain negative for arthralgias and back pain.  Skin: Negative for color change and rash.  Allergic/Immunologic: Negative for immunocompromised state.  Neurological: Negative for tremors, syncope, speech difficulty, weakness, light-headedness and headaches.  Hematological: Does not bruise/bleed easily.  All other systems reviewed and are negative   Past Medical History:  Diagnosis Date  . Anemia   . Asthma   . Bacterial endocarditis    Strep viridans   . Chronic diastolic CHF (congestive heart failure) ([place]) 04/27/2012  . COPD (chronic obstructive pulmonary disease) ([place]) 04/20/12  . H/O hiatal hernia   . Heart murmur   . Hyperlipidemia   . Hypertension   . Migraines   . Morbid obesity ([place])   . Osteoarthritis   . Panic attacks 04/20/12  . Pneumonia 04/2012  . Positive ANA (antinuclear antibody)   . Pulmonary hypertension assoc with unclear multi-factorial mechanisms ([place]) 01/23/2016  . S/P mitral valve repair 04/30/2016   Complex valvuloplasty including autologous pericardial patch repair of perforated posterior leaflet, artificial Gore-tex neochord placement x6 and 26 mm Sorin Memo 3D ring annuloplasty  . 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 Stroke ([place])   . Subarachnoid bleed ([place]) 02/01/2016  . Type II diabetes mellitus ([place])    Past Surgical History:  Procedure Laterality Date  . CARDIAC CATHETERIZATION N/A 04/23/2016   Procedure: Right/Left Heart Cath and Coronary Angiography;  Surgeon: Sherren Mocha, MD;  Location: [facility];  Service: Cardiovascular;  Laterality: N/A;  . DILATION AND CURETTAGE OF UTERUS  1990's  . finger reattachment  1973   "right ring finger"  . [place]   "right upper arm"  . MITRAL VALVE REPAIR N/A 04/30/2016  Procedure: MITRAL VALVE REPAIR ;  Surgeon: Rexene Alberts, MD;  Location: [facility];  Service: Open Heart Surgery;  Laterality: N/A;  . RADIOLOGY WITH ANESTHESIA N/A  02/06/2016   Procedure: RADIOLOGY WITH ANESTHESIA;  Surgeon: Luanne Bras, MD;  Location: [facility];  Service: Radiology;  Laterality: N/A;  . TEE WITHOUT CARDIOVERSION N/A 02/05/2016   Procedure: TRANSESOPHAGEAL ECHOCARDIOGRAM (TEE);  Surgeon: Larey Dresser, MD;  Location: [facility];  Service: Cardiovascular;  Laterality: N/A;  . TEE WITHOUT CARDIOVERSION N/A 04/30/2016   Procedure: TRANSESOPHAGEAL ECHOCARDIOGRAM (TEE);  Surgeon: Rexene Alberts, MD;  Location: [facility];  Service: Open Heart Surgery;  Laterality: N/A;    Social History:  reports that she quit smoking about 8 years ago. Her smoking use included cigarettes. She has a 8.00 pack-year smoking history. She has never used smokeless tobacco. She reports that she does not drink alcohol and does not use drugs.   Allergies  Allergen Reactions  . Codeine Hives, Itching and Other (See Comments)    "breathing problems"  . Lisinopril Swelling    Lips turn blue, face swelling  . Latex Itching    Family History  Problem Relation Age of Onset  . Coronary artery disease Mother 33  . Stroke Father   . COPD Father   . Clotting disorder Maternal Grandmother        died from blood clot  . Allergies Unknown   . Hypertension Unknown     Prior to Admission medications   Medication Sig Start Date End Date Taking? Authorizing Provider  albuterol (PROVENTIL HFA;VENTOLIN HFA) 108 (90 BASE) MCG/ACT inhaler Inhale 2 puffs into the lungs every 4 (four) hours as needed for wheezing or shortness of breath. Reported on 06/12/2016 04/30/12  Yes Ghimire, Henreitta Leber, MD  apixaban (ELIQUIS) 2.5 MG TABS tablet Take 2.5 mg by mouth 2 (two) times daily.   Yes [provider]  bisoprolol (ZEBETA) 5 MG tablet Take 5 mg by mouth daily.   Yes [provider]  bumetanide (BUMEX) 1 MG tablet Take 1 mg by mouth daily.   Yes [provider]  cholecalciferol (VITAMIN D) 1000 units tablet Take 1,000 Units by mouth daily.    Yes [provider]  DULoxetine (CYMBALTA) 60 MG capsule Take 60 mg by mouth daily.   Yes [provider]  fexofenadine (ALLEGRA) 180 MG tablet Take 180 mg by mouth daily.   Yes [provider]  gabapentin (NEURONTIN) 300 MG capsule Take 300 mg by mouth at bedtime.   Yes [provider]  Insulin Degludec-Liraglutide (XULTOPHY) 100-3.6 UNIT-MG/ML SOPN Inject 50 Units into the skin daily.   Yes [provider]  lisdexamfetamine (VYVANSE) 30 MG capsule Take 30 mg by mouth daily.   Yes [provider]  nitroGLYCERIN (NITROSTAT) 0.4 MG SL tablet Place 1 tablet (0.4 mg total) under the tongue every 5 (five) minutes x 3 doses as needed for chest pain. 03/24/16 11/05/19 Yes Weaver, Scott T, PA-C  pantoprazole (PROTONIX) 20 MG tablet Take 20 mg by mouth daily.   Yes [provider]  rosuvastatin (CRESTOR) 10 MG tablet Take 10 mg by mouth daily.   Yes [provider]  spironolactone (ALDACTONE) 25 MG tablet Take 2 tablets (50 mg total) by mouth daily. Reported on 04/09/2016 Patient taking differently: Take 25 mg by mouth See admin instructions. Takes 1 tablet by mouth daily on Monday, Wednesday and Friday. 06/04/16  Yes Angiulli, Lavon Paganini, PA-C  ALPRAZolam Duanne Moron) 0.5 MG  tablet Take 0.5 mg by mouth daily. Reported on 06/12/2016 Patient not taking: Reported on 11/14/2020    [provider]  baclofen (LIORESAL) 10 MG tablet Take 0.5 tablets (5 mg total) by mouth 3 (three) times daily. Patient not taking: No sig reported 06/04/16   Angiulli, Mcarthur Rossetti, PA-C  carvedilol (COREG) 3.125 MG tablet Take 1 tablet (3.125 mg total) by mouth 2 (two) times daily with a meal. Patient not taking: Reported on 11/14/2020 06/04/16   Angiulli, Mcarthur Rossetti, PA-C  clonazePAM (KLONOPIN) 0.5 MG tablet Take 1 tablet (0.5 mg total) by mouth at bedtime. Patient not taking: Reported on 11/14/2020 06/04/16   Angiulli, Mcarthur Rossetti, PA-C  escitalopram (LEXAPRO) 10 MG tablet Take 1  tablet (10 mg total) by mouth at bedtime. Patient not taking: No sig reported 06/04/16   Angiulli, Mcarthur Rossetti, PA-C  famotidine (PEPCID) 20 MG tablet Take 1 tablet (20 mg total) by mouth 2 (two) times daily. Patient not taking: Reported on 11/14/2020 06/04/16   Angiulli, Mcarthur Rossetti, PA-C  furosemide (LASIX) 40 MG tablet Take 1 tablet (40 mg total) by mouth daily. Patient not taking: No sig reported 06/17/16   Tereso Newcomer T, PA-C  insulin detemir (LEVEMIR) 100 UNIT/ML injection Inject 0.45 mLs (45 Units total) into the skin daily. Patient not taking: Reported on 11/14/2020 06/04/16   Angiulli, Mcarthur Rossetti, PA-C  nystatin (MYCOSTATIN/NYSTOP) powder Apply topically 2 (two) times daily. Patient not taking: Reported on 11/14/2020 06/04/16   Angiulli, Mcarthur Rossetti, PA-C  potassium chloride (KLOR-CON) 20 MEQ packet Take 20 mEq by mouth daily. Patient not taking: No sig reported 06/17/16   Tereso Newcomer T, PA-C  traZODone (DESYREL) 100 MG tablet Take 1 tablet (100 mg total) by mouth at bedtime. Patient not taking: Reported on 11/14/2020 06/04/16   Angiulli, Mcarthur Rossetti, PA-C  warfarin (COUMADIN) 5 MG tablet Take 1 tablet (5 mg total) by mouth daily at 6 PM. Patient not taking: Reported on 11/14/2020 06/04/16   Charlton Amor, PA-C    Physical Exam: BP 114/69   Pulse 79   Temp 98.3 F (36.8 C) (Oral)   Resp 13   Ht 5\' 4"  (1.626 m)   Wt 127.5 kg   SpO2 94%   BMI 48.23 kg/m   . General: 64 y.o. year-old female well developed well nourished in no acute distress.  Alert and oriented x3. Marland Kitchen HEENT: NCAT, EOMI . Neck: Supple, trachea medial . Cardiovascular: Regular rate and rhythm with no rubs or gallops.  No thyromegaly or JVD noted.  No lower extremity edema. 2/4 pulses in all 4 extremities. Marland Kitchen Respiratory: Clear to auscultation with no wheezes or rales. Good inspiratory effort. . Abdomen: Soft nontender nondistended with normal bowel sounds x4 quadrants. . Muskuloskeletal: Tender to palpation of left hip.  No  cyanosis, clubbing or edema noted bilaterally . Neuro: CN II-XII intact, grip strength on LUE 3/5, RUE 5/5, sensation, reflexes intact . Skin: Bilateral chronic venous stasis.  No ulcerative lesions noted or rashes . Psychiatry: Judgement and insight appear normal. Mood is appropriate for condition and setting          Labs on Admission:  Basic Metabolic Panel: Recent Labs  Lab 11/14/20 1444  NA 135  K 4.6  CL 100  CO2 24  GLUCOSE 193*  BUN 14  CREATININE 1.19*  CALCIUM 9.4   Liver Function Tests: No results for input(s): AST, ALT, ALKPHOS, BILITOT, PROT, ALBUMIN in the last 168 hours. No results for input(s): LIPASE,  AMYLASE in the last 168 hours. No results for input(s): AMMONIA in the last 168 hours. CBC: Recent Labs  Lab 11/14/20 1444  WBC 14.4*  NEUTROABS 11.8*  HGB 14.8  HCT 48.3*  MCV 88.8  PLT 417*   Cardiac Enzymes: No results for input(s): CKTOTAL, CKMB, CKMBINDEX, TROPONINI in the last 168 hours.  BNP (last 3 results) No results for input(s): BNP in the last 8760 hours.  ProBNP (last 3 results) No results for input(s): PROBNP in the last 8760 hours.  CBG: No results for input(s): GLUCAP in the last 168 hours.  Radiological Exams on Admission: CT Head Wo Contrast  Result Date: 11/14/2020 CLINICAL DATA:  Trauma.  Coagulopathy.  Fall. EXAM: CT HEAD WITHOUT CONTRAST CT CERVICAL SPINE WITHOUT CONTRAST TECHNIQUE: Multidetector CT imaging of the head and cervical spine was performed following the standard protocol without intravenous contrast. Multiplanar CT image reconstructions of the cervical spine were also generated. COMPARISON:  CT of the neck from August 31, 2019. CTA head Apr 22, 2016. MRI February 07, 2016. FINDINGS: CT HEAD FINDINGS Brain: Progressive encephalomalacia and gliosis associated with multiple remote right posterior MCA territory infarcts. No evidence of acute large vascular territory infarct. Additional scattered white matter hypodensities  likely represent the sequela of chronic microvascular ischemic disease. No acute hemorrhage. No mass lesion. No abnormal mass effect. No hydrocephalus. Vascular: Calcific atherosclerosis. Skull: No acute fracture. Sinuses/Orbits: Mild scattered paranasal sinus mucosal thickening with trace frothy secretions in the right sphenoid sinus. Unremarkable orbits. Other: No mastoid effusions. CT CERVICAL SPINE FINDINGS Alignment: Straightening of the normal cervical lordosis. No substantial sagittal subluxation. Mild broad dextrocurvature. Skull base and vertebrae: No evidence of acute fracture. Vertebral body heights are maintained. Soft tissues and spinal canal: No prevertebral fluid or swelling. No visible canal hematoma. Disc levels: Moderate degenerative disc disease at C6-C7 where there is a calcified right eccentric disc protrusion. Upper chest: Partially imaged interlobular septal thickening in the lung apices. Other: Calcific atherosclerosis of the carotid arteries. IMPRESSION: CT head: 1. No evidence of acute intracranial abnormality. 2. Progressive encephalomalacia and gliosis associated with multiple remote right MCA territory infarcts CT cervical spine: 1. No evidence of acute fracture or traumatic malalignment. 2. Moderate degenerative disc disease at C6-C7 where there is a calcified right eccentric disc protrusion. 3. Partially imaged interlobular septal thickening in the lung apices, suggestive of interstitial pulmonary edema. Electronically Signed   By: Margaretha Sheffield MD   On: 11/14/2020 16:50   CT Cervical Spine Wo Contrast  Result Date: 11/14/2020 CLINICAL DATA:  Trauma.  Coagulopathy.  Fall. EXAM: CT HEAD WITHOUT CONTRAST CT CERVICAL SPINE WITHOUT CONTRAST TECHNIQUE: Multidetector CT imaging of the head and cervical spine was performed following the standard protocol without intravenous contrast. Multiplanar CT image reconstructions of the cervical spine were also generated. COMPARISON:  CT of  the neck from August 31, 2019. CTA head Apr 22, 2016. MRI February 07, 2016. FINDINGS: CT HEAD FINDINGS Brain: Progressive encephalomalacia and gliosis associated with multiple remote right posterior MCA territory infarcts. No evidence of acute large vascular territory infarct. Additional scattered white matter hypodensities likely represent the sequela of chronic microvascular ischemic disease. No acute hemorrhage. No mass lesion. No abnormal mass effect. No hydrocephalus. Vascular: Calcific atherosclerosis. Skull: No acute fracture. Sinuses/Orbits: Mild scattered paranasal sinus mucosal thickening with trace frothy secretions in the right sphenoid sinus. Unremarkable orbits. Other: No mastoid effusions. CT CERVICAL SPINE FINDINGS Alignment: Straightening of the normal cervical lordosis. No substantial sagittal subluxation. Mild broad  dextrocurvature. Skull base and vertebrae: No evidence of acute fracture. Vertebral body heights are maintained. Soft tissues and spinal canal: No prevertebral fluid or swelling. No visible canal hematoma. Disc levels: Moderate degenerative disc disease at C6-C7 where there is a calcified right eccentric disc protrusion. Upper chest: Partially imaged interlobular septal thickening in the lung apices. Other: Calcific atherosclerosis of the carotid arteries. IMPRESSION: CT head: 1. No evidence of acute intracranial abnormality. 2. Progressive encephalomalacia and gliosis associated with multiple remote right MCA territory infarcts CT cervical spine: 1. No evidence of acute fracture or traumatic malalignment. 2. Moderate degenerative disc disease at C6-C7 where there is a calcified right eccentric disc protrusion. 3. Partially imaged interlobular septal thickening in the lung apices, suggestive of interstitial pulmonary edema. Electronically Signed   By: Margaretha Sheffield MD   On: 11/14/2020 16:50   DG Chest Portable 1 View  Result Date: 11/14/2020 CLINICAL DATA:  Fall, possible hip  fracture EXAM: PORTABLE CHEST 1 VIEW COMPARISON:  03/07/2018 FINDINGS: Upper normal size of cardiac silhouette post median sternotomy. Atherosclerotic calcification aorta. Rotated to the LEFT. Accentuation of interstitial markings similar to previous exam. No acute infiltrate, pleural effusion or pneumothorax. Bones appear demineralized. IMPRESSION: Chronic accentuation of interstitial markings. No acute abnormalities. Aortic Atherosclerosis (ICD10-I70.0). Electronically Signed   By: Lavonia Dana M.D.   On: 11/14/2020 15:26   DG Hip Unilat W or Wo Pelvis 2-3 Views Left  Result Date: 11/14/2020 CLINICAL DATA:  Pain following fall EXAM: DG HIP (WITH OR WITHOUT PELVIS) 2-3V LEFT COMPARISON:  None. FINDINGS: Frontal pelvis as well as frontal and lateral left hip images were obtained. There is a comminuted intertrochanteric femur fracture on the left with varus angulation at fracture site. No other fracture. No dislocation. There is mild symmetric narrowing of each hip joint. IMPRESSION: Intertrochanteric femur fracture on the left with varus angulation at the fracture site. No other fracture. No dislocation. Symmetric narrowing each hip joint. Electronically Signed   By: Lowella Grip III M.D.   On: 11/14/2020 15:24    EKG: I independently viewed the EKG done and my findings are as followed: Sinus rhythm at a rate of 76 bpm  Assessment/Plan Present on Admission: . Closed left femoral fracture ([place]) . COPD (chronic obstructive pulmonary disease) ([place]) . Chronic diastolic CHF (congestive heart failure) ([place]) . Morbid obesity ([place]) . Essential hypertension . HLD (hyperlipidemia)  Principal Problem:   Closed left femoral fracture ([facility]) Active Problems:   COPD (chronic obstructive pulmonary disease) ([facility])   Chronic diastolic CHF (congestive heart failure) ([facility])   Morbid obesity ([facility])   Essential hypertension   HLD (hyperlipidemia)   History of CVA with residual deficit   Hyperglycemia due to  diabetes mellitus ([place])   Fall at home, initial encounter  Left femoral intertrochanteric fracture secondary to mechanical fall Left hip x-ray showed intertrochanteric femur fracture on the left with varus angulation at the fracture site.  IV opioid medications were given Continue IV morphine 2 mg every 4 hours as needed Continue fall precaution, neurochecks Continue PT/OT eval and treat Orthopedic surgery already consulted with plan to take patient to the OR on Sunday (12/26) due to patient being on Eliquis per ED PA  Leukocytosis/thrombocytosis possibly reactive WBC 14.4, platelets 417 Continue to monitor CBC with morning labs  Hyperglycemia possibly secondary to type 2 diabetes mellitus Continue insulin sliding scale and hypoglycemia protocol  Chronic diastolic CHF Continue home bisoprolol, Bumex, and Crestor  History of CVA with left-sided weakness Continue Crestor,  Eliquis was held due to impending surgical procedure  Essential hypertension Continue bisoprolol, Bumex  Hyperlipidemia Continue Crestor  COPD Continue Ventolin  GERD Continue Protonix  Morbid obesity Patient will be counseled on diet and lifestyle modification when more stable  DVT prophylaxis: SCDs  Code Status: Full code  Family Communication: None at bedside  Disposition Plan:  Patient is from:                        home Anticipated DC to:                   SNF or family members home Anticipated DC date:               2-3 days Anticipated DC barriers:           Patient is unstable to be discharged at this time due to left femoral fracture that requires surgical repair.  Consults called: Orthopedic surgery (per ED PA)  Admission status: Inpatient  Bernadette Hoit MD [facility]  11/14/2020, 9:13 PM

## 2020-11-14 NOTE — Consult Note (Signed)
ORTHOPAEDIC CONSULTATION  REQUESTING PHYSICIAN: Bernadette Hoit, DO  Chief Complaint: left hip pain  HPI: Margaret [name] is a 64 y.o. female with past medical history significant for endocarditis, CHF, COPD, obesity, pulmonary hypertension, valve repair, subarachnoid bleed, CVA, diabetes who presents for evaluation after fall. She had a left sided weakness after prior stroke. She just discontinued using a wheelchair in August of this year. She normally ambulates with a cane. She fell today and had immediate pain in her left hip with inability to bear weight. Orthopedics was consulted for left femur fracture.  Patient having increased pain when seen at bedside. Pain medications wearing off. She took eliquis this morning. She denies pain to any other extremity.   Past Medical History:  Diagnosis Date  . Anemia   . Asthma   . Bacterial endocarditis    Strep viridans   . Chronic diastolic CHF (congestive heart failure) ([place]) 04/27/2012  . COPD (chronic obstructive pulmonary disease) ([place]) 04/20/12  . H/O hiatal hernia   . Heart murmur   . Hyperlipidemia   . Hypertension   . Migraines   . Morbid obesity ([place])   . Osteoarthritis   . Panic attacks 04/20/12  . Pneumonia 04/2012  . Positive ANA (antinuclear antibody)   . Pulmonary hypertension assoc with unclear multi-factorial mechanisms ([place]) 01/23/2016  . S/P mitral valve repair 04/30/2016   Complex valvuloplasty including autologous pericardial patch repair of perforated posterior leaflet, artificial Gore-tex neochord placement x6 and 26 mm Sorin Memo 3D ring annuloplasty  . 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 Stroke ([place])   . Subarachnoid bleed ([place]) 02/01/2016  . Type II diabetes mellitus ([place])    Past Surgical History:  Procedure Laterality Date  . CARDIAC CATHETERIZATION N/A 04/23/2016   Procedure: Right/Left Heart Cath and Coronary Angiography;  Surgeon: Sherren Mocha, MD;  Location: [facility];  Service:  Cardiovascular;  Laterality: N/A;  . DILATION AND CURETTAGE OF UTERUS  1990's  . finger reattachment  1973   "right ring finger"  . [place]   "right upper arm"  . MITRAL VALVE REPAIR N/A 04/30/2016   Procedure: MITRAL VALVE REPAIR ;  Surgeon: Rexene Alberts, MD;  Location: [facility];  Service: Open Heart Surgery;  Laterality: N/A;  . RADIOLOGY WITH ANESTHESIA N/A 02/06/2016   Procedure: RADIOLOGY WITH ANESTHESIA;  Surgeon: Luanne Bras, MD;  Location: [facility];  Service: Radiology;  Laterality: N/A;  . TEE WITHOUT CARDIOVERSION N/A 02/05/2016   Procedure: TRANSESOPHAGEAL ECHOCARDIOGRAM (TEE);  Surgeon: Larey Dresser, MD;  Location: [facility];  Service: Cardiovascular;  Laterality: N/A;  . TEE WITHOUT CARDIOVERSION N/A 04/30/2016   Procedure: TRANSESOPHAGEAL ECHOCARDIOGRAM (TEE);  Surgeon: Rexene Alberts, MD;  Location: [facility];  Service: Open Heart Surgery;  Laterality: N/A;   Social History   Socioeconomic History  . Marital status: Married    Spouse name: Not on file  . Number of children: Not on file  . Years of education: Not on file  . Highest education level: Not on file  Occupational History  . Not on file  Tobacco Use  . Smoking status: Former Smoker    Packs/day: 0.20    Years: 40.00    Pack years: 8.00    Types: Cigarettes    Quit date: 04/20/2012    Years since quitting: 8.5  . Smokeless tobacco: Never Used  . Tobacco comment: 6/5//13 "a pack of cigarettes would last me 1 - 1 1/2 wk"  Substance and Sexual Activity  . Alcohol use: No  . Drug use: No  . Sexual activity: Not Currently    Comment: married, 2 children, work with computers  Other Topics Concern  . Not on file  Social History Narrative  . Not on file   Social Determinants of Health   Financial Resource Strain: Not on file  Food Insecurity: Not on file  Transportation Needs: Not on file  Physical Activity: Not on file  Stress: Not on file  Social Connections: Not on file   Family  History  Problem Relation Age of Onset  . Coronary artery disease Mother 51  . Stroke Father   . COPD Father   . Clotting disorder Maternal Grandmother        died from blood clot  . Allergies Unknown   . Hypertension Unknown    Allergies  Allergen Reactions  . Codeine Hives, Itching and Other (See Comments)    "breathing problems"  . Lisinopril Swelling    Lips turn blue, face swelling  . Latex Itching   Prior to Admission medications   Medication Sig Start Date End Date Taking? Authorizing Provider  albuterol (PROVENTIL HFA;VENTOLIN HFA) 108 (90 BASE) MCG/ACT inhaler Inhale 2 puffs into the lungs every 4 (four) hours as needed for wheezing or shortness of breath. Reported on 06/12/2016 04/30/12  Yes Ghimire, Henreitta Leber, MD  apixaban (ELIQUIS) 2.5 MG TABS tablet Take 2.5 mg by mouth 2 (two) times daily.   Yes [provider]  bisoprolol (ZEBETA) 5 MG tablet Take 5 mg by mouth daily.   Yes [provider]  bumetanide (BUMEX) 1 MG tablet Take 1 mg by mouth daily.   Yes [provider]  cholecalciferol (VITAMIN D) 1000 units tablet Take 1,000 Units by mouth daily.    Yes [provider]  DULoxetine (CYMBALTA) 60 MG capsule Take 60 mg by mouth daily.   Yes [provider]  fexofenadine (ALLEGRA) 180 MG tablet Take 180 mg by mouth daily.   Yes [provider]  gabapentin (NEURONTIN) 300 MG capsule Take 300 mg by mouth at bedtime.   Yes [provider]  Insulin Degludec-Liraglutide (XULTOPHY) 100-3.6 UNIT-MG/ML SOPN Inject 50 Units into the skin daily.   Yes [provider]  lisdexamfetamine (VYVANSE) 30 MG capsule Take 30 mg by mouth daily.   Yes [provider]  nitroGLYCERIN (NITROSTAT) 0.4 MG SL tablet Place 1 tablet (0.4 mg total) under the tongue every 5 (five) minutes x 3 doses as needed for chest pain. 03/24/16 11/05/19 Yes Weaver, Scott T, PA-C  pantoprazole (PROTONIX) 20 MG tablet Take 20 mg by mouth daily.    Yes [provider]  rosuvastatin (CRESTOR) 10 MG tablet Take 10 mg by mouth daily.   Yes [provider]  spironolactone (ALDACTONE) 25 MG tablet Take 2 tablets (50 mg total) by mouth daily. Reported on 04/09/2016 Patient taking differently: Take 25 mg by mouth See admin instructions. Takes 1 tablet by mouth daily on Monday, Wednesday and Friday. 06/04/16  Yes Angiulli, Lavon Paganini, PA-C  ALPRAZolam Duanne Moron) 0.5 MG tablet Take 0.5 mg by mouth daily. Reported on 06/12/2016 Patient not taking: Reported on 11/14/2020    [provider]  baclofen (LIORESAL) 10 MG tablet Take 0.5 tablets (5 mg total) by mouth 3 (three) times daily. Patient not taking: No sig reported 06/04/16   Angiulli, Lavon Paganini, PA-C  carvedilol (COREG) 3.125 MG tablet Take 1 tablet (3.125 mg total) by mouth 2 (  two) times daily with a meal. Patient not taking: Reported on 11/14/2020 06/04/16   Angiulli, Lavon Paganini, PA-C  clonazePAM (KLONOPIN) 0.5 MG tablet Take 1 tablet (0.5 mg total) by mouth at bedtime. Patient not taking: Reported on 11/14/2020 06/04/16   Angiulli, Lavon Paganini, PA-C  escitalopram (LEXAPRO) 10 MG tablet Take 1 tablet (10 mg total) by mouth at bedtime. Patient not taking: No sig reported 06/04/16   Angiulli, Lavon Paganini, PA-C  famotidine (PEPCID) 20 MG tablet Take 1 tablet (20 mg total) by mouth 2 (two) times daily. Patient not taking: Reported on 11/14/2020 06/04/16   Angiulli, Lavon Paganini, PA-C  furosemide (LASIX) 40 MG tablet Take 1 tablet (40 mg total) by mouth daily. Patient not taking: No sig reported 06/17/16   Richardson Dopp T, PA-C  insulin detemir (LEVEMIR) 100 UNIT/ML injection Inject 0.45 mLs (45 Units total) into the skin daily. Patient not taking: Reported on 11/14/2020 06/04/16   Angiulli, Lavon Paganini, PA-C  nystatin (MYCOSTATIN/NYSTOP) powder Apply topically 2 (two) times daily. Patient not taking: Reported on 11/14/2020 06/04/16   Angiulli, Lavon Paganini, PA-C  potassium chloride (KLOR-CON) 20 MEQ  packet Take 20 mEq by mouth daily. Patient not taking: No sig reported 06/17/16   Richardson Dopp T, PA-C  traZODone (DESYREL) 100 MG tablet Take 1 tablet (100 mg total) by mouth at bedtime. Patient not taking: Reported on 11/14/2020 06/04/16   Angiulli, Lavon Paganini, PA-C  warfarin (COUMADIN) 5 MG tablet Take 1 tablet (5 mg total) by mouth daily at 6 PM. Patient not taking: Reported on 11/14/2020 06/04/16   Cathlyn Parsons, PA-C   CT Head Wo Contrast  Result Date: 11/14/2020 CLINICAL DATA:  Trauma.  Coagulopathy.  Fall. EXAM: CT HEAD WITHOUT CONTRAST CT CERVICAL SPINE WITHOUT CONTRAST TECHNIQUE: Multidetector CT imaging of the head and cervical spine was performed following the standard protocol without intravenous contrast. Multiplanar CT image reconstructions of the cervical spine were also generated. COMPARISON:  CT of the neck from August 31, 2019. CTA head Apr 22, 2016. MRI February 07, 2016. FINDINGS: CT HEAD FINDINGS Brain: Progressive encephalomalacia and gliosis associated with multiple remote right posterior MCA territory infarcts. No evidence of acute large vascular territory infarct. Additional scattered white matter hypodensities likely represent the sequela of chronic microvascular ischemic disease. No acute hemorrhage. No mass lesion. No abnormal mass effect. No hydrocephalus. Vascular: Calcific atherosclerosis. Skull: No acute fracture. Sinuses/Orbits: Mild scattered paranasal sinus mucosal thickening with trace frothy secretions in the right sphenoid sinus. Unremarkable orbits. Other: No mastoid effusions. CT CERVICAL SPINE FINDINGS Alignment: Straightening of the normal cervical lordosis. No substantial sagittal subluxation. Mild broad dextrocurvature. Skull base and vertebrae: No evidence of acute fracture. Vertebral body heights are maintained. Soft tissues and spinal canal: No prevertebral fluid or swelling. No visible canal hematoma. Disc levels: Moderate degenerative disc disease at C6-C7  where there is a calcified right eccentric disc protrusion. Upper chest: Partially imaged interlobular septal thickening in the lung apices. Other: Calcific atherosclerosis of the carotid arteries. IMPRESSION: CT head: 1. No evidence of acute intracranial abnormality. 2. Progressive encephalomalacia and gliosis associated with multiple remote right MCA territory infarcts CT cervical spine: 1. No evidence of acute fracture or traumatic malalignment. 2. Moderate degenerative disc disease at C6-C7 where there is a calcified right eccentric disc protrusion. 3. Partially imaged interlobular septal thickening in the lung apices, suggestive of interstitial pulmonary edema. Electronically Signed   By: Margaretha Sheffield MD   On: 11/14/2020 16:50   CT Cervical  Spine Wo Contrast  Result Date: 11/14/2020 CLINICAL DATA:  Trauma.  Coagulopathy.  Fall. EXAM: CT HEAD WITHOUT CONTRAST CT CERVICAL SPINE WITHOUT CONTRAST TECHNIQUE: Multidetector CT imaging of the head and cervical spine was performed following the standard protocol without intravenous contrast. Multiplanar CT image reconstructions of the cervical spine were also generated. COMPARISON:  CT of the neck from August 31, 2019. CTA head Apr 22, 2016. MRI February 07, 2016. FINDINGS: CT HEAD FINDINGS Brain: Progressive encephalomalacia and gliosis associated with multiple remote right posterior MCA territory infarcts. No evidence of acute large vascular territory infarct. Additional scattered white matter hypodensities likely represent the sequela of chronic microvascular ischemic disease. No acute hemorrhage. No mass lesion. No abnormal mass effect. No hydrocephalus. Vascular: Calcific atherosclerosis. Skull: No acute fracture. Sinuses/Orbits: Mild scattered paranasal sinus mucosal thickening with trace frothy secretions in the right sphenoid sinus. Unremarkable orbits. Other: No mastoid effusions. CT CERVICAL SPINE FINDINGS Alignment: Straightening of the normal cervical  lordosis. No substantial sagittal subluxation. Mild broad dextrocurvature. Skull base and vertebrae: No evidence of acute fracture. Vertebral body heights are maintained. Soft tissues and spinal canal: No prevertebral fluid or swelling. No visible canal hematoma. Disc levels: Moderate degenerative disc disease at C6-C7 where there is a calcified right eccentric disc protrusion. Upper chest: Partially imaged interlobular septal thickening in the lung apices. Other: Calcific atherosclerosis of the carotid arteries. IMPRESSION: CT head: 1. No evidence of acute intracranial abnormality. 2. Progressive encephalomalacia and gliosis associated with multiple remote right MCA territory infarcts CT cervical spine: 1. No evidence of acute fracture or traumatic malalignment. 2. Moderate degenerative disc disease at C6-C7 where there is a calcified right eccentric disc protrusion. 3. Partially imaged interlobular septal thickening in the lung apices, suggestive of interstitial pulmonary edema. Electronically Signed   By: Margaretha Sheffield MD   On: 11/14/2020 16:50   DG Chest Portable 1 View  Result Date: 11/14/2020 CLINICAL DATA:  Fall, possible hip fracture EXAM: PORTABLE CHEST 1 VIEW COMPARISON:  03/07/2018 FINDINGS: Upper normal size of cardiac silhouette post median sternotomy. Atherosclerotic calcification aorta. Rotated to the LEFT. Accentuation of interstitial markings similar to previous exam. No acute infiltrate, pleural effusion or pneumothorax. Bones appear demineralized. IMPRESSION: Chronic accentuation of interstitial markings. No acute abnormalities. Aortic Atherosclerosis (ICD10-I70.0). Electronically Signed   By: Lavonia Dana M.D.   On: 11/14/2020 15:26   DG Hip Unilat W or Wo Pelvis 2-3 Views Left  Result Date: 11/14/2020 CLINICAL DATA:  Pain following fall EXAM: DG HIP (WITH OR WITHOUT PELVIS) 2-3V LEFT COMPARISON:  None. FINDINGS: Frontal pelvis as well as frontal and lateral left hip images were  obtained. There is a comminuted intertrochanteric femur fracture on the left with varus angulation at fracture site. No other fracture. No dislocation. There is mild symmetric narrowing of each hip joint. IMPRESSION: Intertrochanteric femur fracture on the left with varus angulation at the fracture site. No other fracture. No dislocation. Symmetric narrowing each hip joint. Electronically Signed   By: Lowella Grip III M.D.   On: 11/14/2020 15:24   Family History Reviewed and non-contributory, no pertinent history of problems with bleeding or anesthesia      Review of Systems 14 system ROS conducted and negative except for that noted in HPI   OBJECTIVE  Vitals: Patient Vitals for the past 8 hrs:  BP Temp Temp src Pulse Resp SpO2 Height Weight  11/14/20 2030 114/69 -- -- 79 13 94 % -- --  11/14/20 2015 115/64 -- -- 79 10  90 % -- --  11/14/20 2000 (!) 141/77 98.3 F (36.8 C) Oral 76 -- 94 % -- --  11/14/20 1945 110/71 -- -- 86 -- 91 % -- --  11/14/20 1930 116/89 -- -- 73 -- 95 % -- --  11/14/20 1900 116/73 -- -- 76 11 97 % -- --  11/14/20 1830 (!) 100/58 -- -- 75 16 95 % -- --  11/14/20 1828 102/65 -- -- 75 11 97 % -- --  11/14/20 1800 113/60 -- -- 74 13 93 % -- --  11/14/20 1730 132/75 -- -- 77 15 90 % -- --  11/14/20 1715 103/60 -- -- 76 10 93 % -- --  11/14/20 1600 108/73 -- -- 77 (!) 21 (!) 87 % -- --  11/14/20 1530 115/71 -- -- 79 15 94 % -- --  11/14/20 1445 107/67 98.1 F (36.7 C) Oral 78 16 92 % -- --  11/14/20 1443 -- -- -- -- -- -- _0  (1.626 m) 127.5 kg   General: Alert, no acute distress Cardiovascular: Warm extremities noted Respiratory: No cyanosis, no use of accessory musculature GI: No organomegaly, abdomen is soft and non-tender Skin: No lesions in the area of chief complaint other than those listed below in MSK exam.  Neurologic: Sensation intact distally save for the below mentioned MSK exam Psychiatric: Patient is competent for consent with normal mood  and affect Lymphatic: No swelling obvious and reported other than the area involved in the exam below  Extremities  Bilateral upper extremities: actively moves upper extremities without pain. Non-tender shoulder, elbow, wrist, hand. Right hand brace likely to prevent contractures. NVI RLE: Non-tender to palpation right hip, knee, ankle, foot. + GS/TA/EHL. Sensation intact distally. 2+ DP pulse with warm and well perfused digits. Compartments soft and compressible, with no pain on passive stretch. LLE: Shortened and externally rotated.  ROM deferred. + GS/TA/EHL. Endorses distal sensation. 2+ DP pulse with warm and well perfused digits. Compartments soft and compressible, with no pain on passive stretch.     Test Results Imaging Xrays showed Intertrochanteric femur fracture on the left with varus angulation at the fracture site.  Labs cbc Recent Labs    11/14/20 1444  WBC 14.4*  HGB 14.8  HCT 48.3*  PLT 417*    Labs inflam No results for input(s): CRP in the last 72 hours.  Invalid input(s): ESR  Labs coag Recent Labs    11/14/20 1703  INR 1.2    Recent Labs    11/14/20 1444  NA 135  K 4.6  CL 100  CO2 24  GLUCOSE 193*  BUN 14  CREATININE 1.19*  CALCIUM 9.4     ASSESSMENT AND PLAN: 64 y.o. female with the following: left intertrochanteric femur fracture  Orthopedics recommends admission to a medical service and we will provide consultation and follow along.  Discussed the nature of the injury as well as the care with the patient.  Discussed options and non-operative versus operative measures. Nonoperative measures are not well tolerated as patient's on bedrest for extended periods of time tend to develop secondary issues such as pneumonia, urinary tract infections, bedsores and delirium.  Based on this our recommendation is for operative measures.  Understanding this the patient elected to proceed with operative measures.  The risks and benefits of  surgical  intervention including infection, bleeding, nerve injury, periprosthetic fracture, the need for revision surgery, leg length discrepancy, gait change, blood clots, cardiopulmonary complications, morbidity, mortality, among others, and they were  willing to proceed.    Patient takes Eliquis daily. Unsafe to proceed with surgery at this time. Patient to admit and provide clearance. Pain control recommended while waiting until it is safe for her to proceed with surgery.    - Plan: Left femur IMN on Sunday with Dr. Percell Miller - NPO at midnight 11/17/2020 -Medicine team to admit and perform pre-op clearance - Weight Bearing Status/Activity: will ammend WB status postop, bedrest for now - PT/OT post op - VTE Prophylaxis: SCDs for now - Pain control: PRN pain medications - Dispo: Likely to require Rehab or SNF placement upon discharge.  - Contact information: Dr. Ophelia Charter, Noemi Chapel, PA-C  [place], [place] 11/14/2020 9:16 PM

## 2020-11-14 NOTE — Progress Notes (Signed)
Patient arrived on the floor from the ED at 2040 via stretcher. Patient transferred to bed, cleaned up, and vital signs taken. Risk bracelets applied and admission assessment will be completed to the best of my ability. Will review orders and proceed with POC.

## 2020-11-14 NOTE — ED Provider Notes (Addendum)
[facility] EMERGENCY DEPARTMENT Provider Note   CSN: [id number] Arrival date & time: 11/14/20  1433    History Chief Complaint  Patient presents with  . Fall    Left hip pain    Margaret [name] is a 64 y.o. female with past medical history significant for endocarditis, CHF, COPD, obesity, pulmonary hypertension, valve repair, subarachnoid bleed, CVA, diabetes who presents for evaluation after fall.  Patient with left-sided weakness after prior stroke.  This with a cane at baseline.  He few months ago was walking with a walker.  Had a mechanical fall today.  States she "slipped and fell."  She does not think she had her head however states she is not sure.  States she knows that she has severe left hip pain.  No prior hip replacements.  No pelvic pain.  No preceding symptoms prior to fall such as headache, lightheadedness, dizziness, weakness, paresthesias, chest pain, shortness of breath or abdominal pain.  Rates her current pain a 10/10 located to her left hip.  She denies any back pain, paresthesias or weakness.  Denies additional aggravating or alleviating factors.  Of note her husband is here as well who went to help her get up and suddenly became unresponsive.  She is very concerned about him which is very anxious which is to be expected.  History obtained from patient and past medical records.  No interpreter used.  Ortho- None PCP- [facility]  HPI     Past Medical History:  Diagnosis Date  . Anemia   . Asthma   . Bacterial endocarditis    Strep viridans   . Chronic diastolic CHF (congestive heart failure) ([place]) 04/27/2012  . COPD (chronic obstructive pulmonary disease) ([place]) 04/20/12  . H/O hiatal hernia   . Heart murmur   . Hyperlipidemia   . Hypertension   . Migraines   . Morbid obesity ([place])   . Osteoarthritis   . Panic attacks 04/20/12  . Pneumonia 04/2012  . Positive ANA (antinuclear antibody)   . Pulmonary hypertension assoc with  unclear multi-factorial mechanisms ([place]) 01/23/2016  . S/P mitral valve repair 04/30/2016   Complex valvuloplasty including autologous pericardial patch repair of perforated posterior leaflet, artificial Gore-tex neochord placement x6 and 26 mm Sorin Memo 3D ring annuloplasty  . 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 Stroke ([place])   . Subarachnoid bleed ([place]) 02/01/2016  . Type II diabetes mellitus [facility])     Patient Active Problem List   Diagnosis Date Noted  . Left shoulder pain 06/29/2016  . Encounter for therapeutic drug monitoring 06/17/2016  . Spastic hemiplegia affecting left nondominant side ([place]) 06/12/2016  . Generalized anxiety disorder 06/12/2016  . Muscle spasticity   . Adjustment disorder with anxious mood   . Subtherapeutic anticoagulation   . PAF (paroxysmal atrial fibrillation) ([place])   . Hemiplegia of nondominant side, late effect of cerebrovascular disease ([place])   . 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 Hemiparesis and dysphasia as late effect of cerebrovascular accident (CVA) ([place])   . Flaccid hemiplegia affecting left nondominant side ([place])   . Interstitial edema   . Type 2 diabetes mellitus with peripheral neuropathy (HCC)   . Debilitated 05/12/2016  . Left hemiparesis ([place]) 05/12/2016  . History of subarachnoid hemorrhage   . SOB (shortness of breath)   . History of CVA with residual deficit   . Dysarthria, post-stroke   . Chronic obstructive pulmonary disease ([place])   . Tachypnea   .  Bradycardia   . Hypokalemia   . Leukocytosis   . SIRS (systemic inflammatory response syndrome) (HCC)   . Acute blood loss anemia   . Thrombocytosis   . S/P mitral valve repair 04/30/2016  . 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 Acute on chronic diastolic congestive heart failure due to valvular disease ([place]) 04/14/2016  . CHF due to valvular disease, acute on chronic, diastolic  [id number]  . Chronic diastolic congestive heart failure due to valvular disease ([place]) 04/13/2016  . History of stroke 04/13/2016  . Cerebrovascular accident (CVA) due to occlusion of right carotid artery ([place]) 03/25/2016  . Splenic infarct 03/25/2016  . Type 2 diabetes mellitus with circulatory disorder ([place]) 03/25/2016  . Mitral regurgitation 03/06/2016  . HLD (hyperlipidemia) 03/06/2016  . Right carotid artery occlusion   . Epigastric abdominal tenderness on direct palpation   . Endocarditis of mitral valve   . Positive ANA (antinuclear antibody)   . Streptococcus viridans infection 02/04/2016  . Cerebrovascular accident (CVA) due to embolism of cerebral artery ([place])   . Subarachnoid hemorrhage ([place]) 02/01/2016  . Stroke ([place])   . Visual changes   . Pulmonary infiltrates 01/23/2016  . Pulmonary hypertension assoc with unclear multi-factorial mechanisms ([place]) 01/23/2016  . 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 Insulin dependent diabetes mellitus 01/14/2016  . Essential hypertension 01/14/2016  . Hepatic cirrhosis ([place]) 01/14/2016  . COPD (chronic obstructive pulmonary disease) ([place]) 04/27/2012  . Chronic diastolic CHF (congestive heart failure) ([place]) 04/27/2012  . Morbid obesity ([place]) 04/27/2012  . Hypoxia 04/27/2012  . Dyspnea on exertion 04/27/2012  . Smoker 04/27/2012    Past Surgical History:  Procedure Laterality Date  . CARDIAC CATHETERIZATION N/A 04/23/2016   Procedure: Right/Left Heart Cath and Coronary Angiography;  Surgeon: Sherren Mocha, MD;  Location: [facility];  Service: Cardiovascular;  Laterality: N/A;  . DILATION AND CURETTAGE OF UTERUS  1990's  . finger reattachment  1973   "right ring finger"  . [place]   "right upper arm"  . MITRAL VALVE REPAIR N/A 04/30/2016   Procedure: MITRAL VALVE REPAIR ;  Surgeon: Rexene Alberts, MD;  Location: [facility];  Service: Open Heart Surgery;  Laterality: N/A;  . RADIOLOGY WITH ANESTHESIA N/A 02/06/2016    Procedure: RADIOLOGY WITH ANESTHESIA;  Surgeon: Luanne Bras, MD;  Location: [facility];  Service: Radiology;  Laterality: N/A;  . TEE WITHOUT CARDIOVERSION N/A 02/05/2016   Procedure: TRANSESOPHAGEAL ECHOCARDIOGRAM (TEE);  Surgeon: Larey Dresser, MD;  Location: [facility];  Service: Cardiovascular;  Laterality: N/A;  . TEE WITHOUT CARDIOVERSION N/A 04/30/2016   Procedure: TRANSESOPHAGEAL ECHOCARDIOGRAM (TEE);  Surgeon: Rexene Alberts, MD;  Location: [facility];  Service: Open Heart Surgery;  Laterality: N/A;     OB History   No obstetric history on file.     Family History  Problem Relation Age of Onset  . Coronary artery disease Mother 50  . Stroke Father   . COPD Father   . Clotting disorder Maternal Grandmother        died from blood clot  . Allergies Unknown   . Hypertension Unknown     Social History   Tobacco Use  . Smoking status: Former Smoker    Packs/day: 0.20    Years: 40.00    Pack years: 8.00    Types: Cigarettes    Quit date: 04/20/2012    Years since quitting: 8.5  .  Smokeless tobacco: Never Used  . Tobacco comment: 6/5//13 "a pack of cigarettes would last me 1 - 1 1/2 wk"  Substance Use Topics  . Alcohol use: No  . Drug use: No    Home Medications Prior to Admission medications   Medication Sig Start Date End Date Taking? Authorizing Provider  albuterol (PROVENTIL HFA;VENTOLIN HFA) 108 (90 BASE) MCG/ACT inhaler Inhale 2 puffs into the lungs every 4 (four) hours as needed for wheezing or shortness of breath. Reported on 06/12/2016 04/30/12  Yes Ghimire, Henreitta Leber, MD  apixaban (ELIQUIS) 2.5 MG TABS tablet Take 2.5 mg by mouth 2 (two) times daily.   Yes [provider]  bisoprolol (ZEBETA) 5 MG tablet Take 5 mg by mouth daily.   Yes [provider]  bumetanide (BUMEX) 1 MG tablet Take 1 mg by mouth daily.   Yes [provider]  cholecalciferol (VITAMIN D) 1000 units tablet Take 1,000 Units by mouth daily.    Yes [provider]  DULoxetine (CYMBALTA) 60 MG capsule Take 60 mg by mouth daily.   Yes [provider]  fexofenadine (ALLEGRA) 180 MG tablet Take 180 mg by mouth daily.   Yes [provider]  gabapentin (NEURONTIN) 300 MG capsule Take 300 mg by mouth at bedtime.   Yes [provider]  Insulin Degludec-Liraglutide (XULTOPHY) 100-3.6 UNIT-MG/ML SOPN Inject 50 Units into the skin daily.   Yes [provider]  lisdexamfetamine (VYVANSE) 30 MG capsule Take 30 mg by mouth daily.   Yes [provider]  nitroGLYCERIN (NITROSTAT) 0.4 MG SL tablet Place 1 tablet (0.4 mg total) under the tongue every 5 (five) minutes x 3 doses as needed for chest pain. 03/24/16 11/05/19 Yes Weaver, Scott T, PA-C  pantoprazole (PROTONIX) 20 MG tablet Take 20 mg by mouth daily.   Yes [provider]  rosuvastatin (CRESTOR) 10 MG tablet Take 10 mg by mouth daily.   Yes [provider]  spironolactone (ALDACTONE) 25 MG tablet Take 2 tablets (50 mg total) by mouth daily. Reported on 04/09/2016 Patient taking differently: Take 25 mg by mouth See admin instructions. Takes 1 tablet by mouth daily on Monday, Wednesday and Friday. 06/04/16  Yes Angiulli, Lavon Paganini, PA-C  ALPRAZolam Duanne Moron) 0.5 MG tablet Take 0.5 mg by mouth daily. Reported on 06/12/2016 Patient not taking: Reported on 11/14/2020    [provider]  baclofen (LIORESAL) 10 MG tablet Take 0.5 tablets (5 mg total) by mouth 3 (three) times daily. Patient not taking: No sig reported 06/04/16   Angiulli, Lavon Paganini, PA-C  carvedilol (COREG) 3.125 MG tablet Take 1 tablet (3.125 mg total) by mouth 2 (two) times daily with a meal. Patient not taking: Reported on 11/14/2020 06/04/16   Angiulli, Lavon Paganini, PA-C  clonazePAM (KLONOPIN) 0.5 MG tablet Take 1 tablet (0.5 mg total) by mouth at bedtime. Patient not taking: Reported on 11/14/2020 06/04/16   Angiulli, Lavon Paganini, PA-C  escitalopram (LEXAPRO) 10 MG tablet Take 1 tablet (10 mg  total) by mouth at bedtime. Patient not taking: No sig reported 06/04/16   Angiulli, Lavon Paganini, PA-C  famotidine (PEPCID) 20 MG tablet Take 1 tablet (20 mg total) by mouth 2 (two) times daily. Patient not taking: Reported on 11/14/2020 06/04/16   Angiulli, Lavon Paganini, PA-C  furosemide (LASIX) 40 MG tablet Take 1 tablet (40 mg total) by mouth daily. Patient not taking: No sig reported 06/17/16   Richardson Dopp T, PA-C  insulin detemir (LEVEMIR) 100 UNIT/ML injection  Inject 0.45 mLs (45 Units total) into the skin daily. Patient not taking: Reported on 11/14/2020 06/04/16   Angiulli, Lavon Paganini, PA-C  nystatin (MYCOSTATIN/NYSTOP) powder Apply topically 2 (two) times daily. Patient not taking: Reported on 11/14/2020 06/04/16   Angiulli, Lavon Paganini, PA-C  potassium chloride (KLOR-CON) 20 MEQ packet Take 20 mEq by mouth daily. Patient not taking: No sig reported 06/17/16   Richardson Dopp T, PA-C  traZODone (DESYREL) 100 MG tablet Take 1 tablet (100 mg total) by mouth at bedtime. Patient not taking: Reported on 11/14/2020 06/04/16   Angiulli, Lavon Paganini, PA-C  warfarin (COUMADIN) 5 MG tablet Take 1 tablet (5 mg total) by mouth daily at 6 PM. Patient not taking: Reported on 11/14/2020 06/04/16   Angiulli, Lavon Paganini, PA-C    Allergies    Codeine, Lisinopril, and Latex  Review of Systems   Review of Systems  Constitutional: Negative.   HENT: Negative.   Respiratory: Negative.   Cardiovascular: Negative.   Gastrointestinal: Negative.   Musculoskeletal: Positive for gait problem. Negative for arthralgias, back pain, joint swelling, myalgias, neck pain and neck stiffness.       Left hip pain  Skin: Negative.   All other systems reviewed and are negative.   Physical Exam Updated Vital Signs BP 102/65 (BP Location: Right Arm)   Pulse 75   Temp 98.1 F (36.7 C) (Oral)   Resp 11   Ht 5\' 4"  (1.626 m)   Wt 127.5 kg   SpO2 97%   BMI 48.23 kg/m   Physical Exam Vitals and nursing note reviewed.   Constitutional:      General: She is not in acute distress.    Appearance: She is well-developed and well-nourished. She is ill-appearing (Chronically ill appearing). She is not toxic-appearing or diaphoretic.  HENT:     Head: Normocephalic and atraumatic.     Nose: Nose normal.     Mouth/Throat:     Mouth: Mucous membranes are moist.  Eyes:     Pupils: Pupils are equal, round, and reactive to light.  Neck:     Comments: No midline spinal tenderness, crepitus or step-offs.  Full range of motion without difficulty. Cardiovascular:     Rate and Rhythm: Normal rate.     Pulses: Normal pulses and intact distal pulses.     Heart sounds: Normal heart sounds.  Pulmonary:     Effort: Pulmonary effort is normal. No respiratory distress.     Breath sounds: Normal breath sounds.     Comments: Speaks in full sentences without difficulty.  Clear bilaterally. Abdominal:     General: There is no distension.     Palpations: There is no mass.     Tenderness: There is no abdominal tenderness. There is no right CVA tenderness, left CVA tenderness, guarding or rebound.     Hernia: No hernia is present.     Comments: Protuberant abdomen.  Soft, nontender.  Musculoskeletal:        General: Normal range of motion.     Cervical back: Normal range of motion.     Comments: Moves bilateral upper extremities without difficulty.  Tenderness to left hip.  Difficult to assess as she has both legs flexed at the knees however possible shortening and rotation to left leg.  Wiggles bilateral toes without difficulty.  Full range of motion without bony tenderness of bilateral tib-fib, feet or knees  Skin:    General: Skin is warm and dry.     Capillary Refill: Capillary  refill takes 2 to 3 seconds.     Comments: Chronic venous stasis skin changes to bilateral lower extremities.  Neurological:     General: No focal deficit present.     Mental Status: She is alert and oriented to person, place, and time.      Comments: Cranial nerves II to XII grossly intact Slight decrease in handgrip on left 2/2 prior CVA Unable to ambulate due to possible hip fracture Intact sensation bilateral lower extremities  Psychiatric:        Mood and Affect: Mood and affect normal.     ED Results / Procedures / Treatments   Labs (all labs ordered are listed, but only abnormal results are displayed) Labs Reviewed  CBC WITH DIFFERENTIAL/PLATELET - Abnormal; Notable for the following components:      Result Value   WBC 14.4 (*)    RBC 5.44 (*)    HCT 48.3 (*)    RDW 16.1 (*)    Platelets 417 (*)    Neutro Abs 11.8 (*)    Abs Immature Granulocytes 0.10 (*)    All other components within normal limits  BASIC METABOLIC PANEL - Abnormal; Notable for the following components:   Glucose, Bld 193 (*)    Creatinine, Ser 1.19 (*)    GFR, Estimated 51 (*)    All other components within normal limits  RESP PANEL BY RT-PCR (FLU A&B, COVID) [id number]  SURGICAL PCR SCREEN  PROTIME-INR    EKG None  Radiology CT Head Wo Contrast  Result Date: 11/14/2020 CLINICAL DATA:  Trauma.  Coagulopathy.  Fall. EXAM: CT HEAD WITHOUT CONTRAST CT CERVICAL SPINE WITHOUT CONTRAST TECHNIQUE: Multidetector CT imaging of the head and cervical spine was performed following the standard protocol without intravenous contrast. Multiplanar CT image reconstructions of the cervical spine were also generated. COMPARISON:  CT of the neck from August 31, 2019. CTA head Apr 22, 2016. MRI February 07, 2016. FINDINGS: CT HEAD FINDINGS Brain: Progressive encephalomalacia and gliosis associated with multiple remote right posterior MCA territory infarcts. No evidence of acute large vascular territory infarct. Additional scattered white matter hypodensities likely represent the sequela of chronic microvascular ischemic disease. No acute hemorrhage. No mass lesion. No abnormal mass effect. No hydrocephalus. Vascular: Calcific atherosclerosis. Skull: No acute  fracture. Sinuses/Orbits: Mild scattered paranasal sinus mucosal thickening with trace frothy secretions in the right sphenoid sinus. Unremarkable orbits. Other: No mastoid effusions. CT CERVICAL SPINE FINDINGS Alignment: Straightening of the normal cervical lordosis. No substantial sagittal subluxation. Mild broad dextrocurvature. Skull base and vertebrae: No evidence of acute fracture. Vertebral body heights are maintained. Soft tissues and spinal canal: No prevertebral fluid or swelling. No visible canal hematoma. Disc levels: Moderate degenerative disc disease at C6-C7 where there is a calcified right eccentric disc protrusion. Upper chest: Partially imaged interlobular septal thickening in the lung apices. Other: Calcific atherosclerosis of the carotid arteries. IMPRESSION: CT head: 1. No evidence of acute intracranial abnormality. 2. Progressive encephalomalacia and gliosis associated with multiple remote right MCA territory infarcts CT cervical spine: 1. No evidence of acute fracture or traumatic malalignment. 2. Moderate degenerative disc disease at C6-C7 where there is a calcified right eccentric disc protrusion. 3. Partially imaged interlobular septal thickening in the lung apices, suggestive of interstitial pulmonary edema. Electronically Signed   By: Margaretha Sheffield MD   On: 11/14/2020 16:50   CT Cervical Spine Wo Contrast  Result Date: 11/14/2020 CLINICAL DATA:  Trauma.  Coagulopathy.  Fall. EXAM: CT HEAD WITHOUT CONTRAST CT  CERVICAL SPINE WITHOUT CONTRAST TECHNIQUE: Multidetector CT imaging of the head and cervical spine was performed following the standard protocol without intravenous contrast. Multiplanar CT image reconstructions of the cervical spine were also generated. COMPARISON:  CT of the neck from August 31, 2019. CTA head Apr 22, 2016. MRI February 07, 2016. FINDINGS: CT HEAD FINDINGS Brain: Progressive encephalomalacia and gliosis associated with multiple remote right posterior MCA  territory infarcts. No evidence of acute large vascular territory infarct. Additional scattered white matter hypodensities likely represent the sequela of chronic microvascular ischemic disease. No acute hemorrhage. No mass lesion. No abnormal mass effect. No hydrocephalus. Vascular: Calcific atherosclerosis. Skull: No acute fracture. Sinuses/Orbits: Mild scattered paranasal sinus mucosal thickening with trace frothy secretions in the right sphenoid sinus. Unremarkable orbits. Other: No mastoid effusions. CT CERVICAL SPINE FINDINGS Alignment: Straightening of the normal cervical lordosis. No substantial sagittal subluxation. Mild broad dextrocurvature. Skull base and vertebrae: No evidence of acute fracture. Vertebral body heights are maintained. Soft tissues and spinal canal: No prevertebral fluid or swelling. No visible canal hematoma. Disc levels: Moderate degenerative disc disease at C6-C7 where there is a calcified right eccentric disc protrusion. Upper chest: Partially imaged interlobular septal thickening in the lung apices. Other: Calcific atherosclerosis of the carotid arteries. IMPRESSION: CT head: 1. No evidence of acute intracranial abnormality. 2. Progressive encephalomalacia and gliosis associated with multiple remote right MCA territory infarcts CT cervical spine: 1. No evidence of acute fracture or traumatic malalignment. 2. Moderate degenerative disc disease at C6-C7 where there is a calcified right eccentric disc protrusion. 3. Partially imaged interlobular septal thickening in the lung apices, suggestive of interstitial pulmonary edema. Electronically Signed   By: Margaretha Sheffield MD   On: 11/14/2020 16:50   DG Chest Portable 1 View  Result Date: 11/14/2020 CLINICAL DATA:  Fall, possible hip fracture EXAM: PORTABLE CHEST 1 VIEW COMPARISON:  03/07/2018 FINDINGS: Upper normal size of cardiac silhouette post median sternotomy. Atherosclerotic calcification aorta. Rotated to the LEFT.  Accentuation of interstitial markings similar to previous exam. No acute infiltrate, pleural effusion or pneumothorax. Bones appear demineralized. IMPRESSION: Chronic accentuation of interstitial markings. No acute abnormalities. Aortic Atherosclerosis (ICD10-I70.0). Electronically Signed   By: Lavonia Dana M.D.   On: 11/14/2020 15:26   DG Hip Unilat W or Wo Pelvis 2-3 Views Left  Result Date: 11/14/2020 CLINICAL DATA:  Pain following fall EXAM: DG HIP (WITH OR WITHOUT PELVIS) 2-3V LEFT COMPARISON:  None. FINDINGS: Frontal pelvis as well as frontal and lateral left hip images were obtained. There is a comminuted intertrochanteric femur fracture on the left with varus angulation at fracture site. No other fracture. No dislocation. There is mild symmetric narrowing of each hip joint. IMPRESSION: Intertrochanteric femur fracture on the left with varus angulation at the fracture site. No other fracture. No dislocation. Symmetric narrowing each hip joint. Electronically Signed   By: Lowella Grip III M.D.   On: 11/14/2020 15:24    Procedures Procedures (including critical care time)  Medications Ordered in ED Medications  mupirocin ointment (BACTROBAN) 2 % 1 application (has no administration in time range)  fentaNYL (SUBLIMAZE) injection 50 mcg (50 mcg Intravenous Given 11/14/20 1457)  ondansetron (ZOFRAN) injection 4 mg (4 mg Intravenous Given 11/14/20 1458)  morphine 4 MG/ML injection 4 mg (4 mg Intravenous Given 11/14/20 1527)  fentaNYL (SUBLIMAZE) injection 100 mcg (100 mcg Intravenous Given 11/14/20 1711)  HYDROmorphone (DILAUDID) injection 1 mg (1 mg Intravenous Given 11/14/20 1826)   ED Course  I have reviewed the  triage vital signs and the nursing notes.  Pertinent labs & imaging results that were available during my care of the patient were reviewed by me and considered in my medical decision making (see chart for details).   64 year old, chronically ill patient presents for  evaluation after mechanical fall.  She is anticoagulated on Coumadin. Does not think she hit her head. Her only pain is to her left hip.  Possible shortening of this. She is neurovascularly intact.  No breaks in skin.  Good pulses bilaterally.  Full range of motion bilateral upper extremities.  No midline spinal tenderness, step-off.  Plan on labs, imaging and reassess.  Labs and itching personally reviewed and interpreted:  CBC mild leukocytosis at 14.4, no infectious symptoms BMP mild hyperglycemia 194, creatinine 1.19, no additional electrolyte, renal abnormality INR 1.2 COVID neagtive CT head without acute findings CT cervical without acute findings DG chest without acute findings DG left hip and pelvis with left comminuted intertrochanteric fracture  Patient reassessed. Pain mildly improved. Given additional medication. NV intact.  CONSULT with Dr. Griffin Basil with Ortho. States surgery likely on Sunday. Admit to medicine  CONSULT with Dr. Josephine Cables with [facility] who will evaluate patient for admission.  She did get sleepy after pain medication.  Was placed on 1 L via nasal cannula.  Pain controlled.  Will be admitted for further management.  The patient appears reasonably stabilized for admission considering the current resources, flow, and capabilities available in the ED at this time, and I doubt any other [facility] requiring further screening and/or treatment in the ED prior to admission.  Patient seen and eval by attending, Dr. Gilford Raid who agrees with above treatment, plan and disposition     MDM Rules/Calculators/A&P                           Final Clinical Impression(s) / ED Diagnoses Final diagnoses:  Fall, initial encounter  Closed displaced intertrochanteric fracture of left femur, initial encounter [facility])    Rx / DC Orders ED Discharge Orders    None       Marcial Pless A, PA-C 11/14/20 1856    Isak Sotomayor A, PA-C 11/14/20 2230    Isla Pence,  MD 11/15/20 [phone]

## 2020-11-15 DIAGNOSIS — W19XXXA Unspecified fall, initial encounter: Secondary | ICD-10-CM | POA: Diagnosis not present

## 2020-11-15 DIAGNOSIS — E7800 Pure hypercholesterolemia, unspecified: Secondary | ICD-10-CM

## 2020-11-15 DIAGNOSIS — I5032 Chronic diastolic (congestive) heart failure: Secondary | ICD-10-CM | POA: Diagnosis not present

## 2020-11-15 DIAGNOSIS — J431 Panlobular emphysema: Secondary | ICD-10-CM | POA: Diagnosis not present

## 2020-11-15 DIAGNOSIS — S72142A Displaced intertrochanteric fracture of left femur, initial encounter for closed fracture: Secondary | ICD-10-CM | POA: Diagnosis not present

## 2020-11-15 LAB — CBC
HCT: 46.5 % — ABNORMAL HIGH (ref 36.0–46.0)
Hemoglobin: 14.6 g/dL (ref 12.0–15.0)
MCH: 28 pg (ref 26.0–34.0)
MCHC: 31.4 g/dL (ref 30.0–36.0)
MCV: 89.1 fL (ref 80.0–100.0)
Platelets: 412 10*3/uL — ABNORMAL HIGH (ref 150–400)
RBC: 5.22 MIL/uL — ABNORMAL HIGH (ref 3.87–5.11)
RDW: 16 % — ABNORMAL HIGH (ref 11.5–15.5)
WBC: 15.2 10*3/uL — ABNORMAL HIGH (ref 4.0–10.5)
nRBC: 0 % (ref 0.0–0.2)

## 2020-11-15 LAB — COMPREHENSIVE METABOLIC PANEL
ALT: 22 U/L (ref 0–44)
AST: 23 U/L (ref 15–41)
Albumin: 3 g/dL — ABNORMAL LOW (ref 3.5–5.0)
Alkaline Phosphatase: 91 U/L (ref 38–126)
Anion gap: 8 (ref 5–15)
BUN: 15 mg/dL (ref 8–23)
CO2: 33 mmol/L — ABNORMAL HIGH (ref 22–32)
Calcium: 9.8 mg/dL (ref 8.9–10.3)
Chloride: 96 mmol/L — ABNORMAL LOW (ref 98–111)
Creatinine, Ser: 1.24 mg/dL — ABNORMAL HIGH (ref 0.44–1.00)
GFR, Estimated: 49 mL/min — ABNORMAL LOW (ref >60)
Glucose, Bld: 157 mg/dL — ABNORMAL HIGH (ref 70–99)
Potassium: 4.3 mmol/L (ref 3.5–5.1)
Sodium: 137 mmol/L (ref 135–145)
Total Bilirubin: 1 mg/dL (ref 0.3–1.2)
Total Protein: 7.1 g/dL (ref 6.5–8.1)

## 2020-11-15 LAB — GLUCOSE, CAPILLARY
Glucose-Capillary: 146 mg/dL — ABNORMAL HIGH (ref 70–99)
Glucose-Capillary: 196 mg/dL — ABNORMAL HIGH (ref 70–99)
Glucose-Capillary: 120 mg/dL — ABNORMAL HIGH (ref 70–99)
Glucose-Capillary: 178 mg/dL — ABNORMAL HIGH (ref 70–99)

## 2020-11-15 LAB — SURGICAL PCR SCREEN
MRSA, PCR: POSITIVE — AB
Staphylococcus aureus: POSITIVE — AB

## 2020-11-15 LAB — PROTIME-INR
INR: 1.1 (ref 0.8–1.2)
Prothrombin Time: 13.8 seconds (ref 11.4–15.2)

## 2020-11-15 LAB — APTT: aPTT: 29 seconds (ref 24–36)

## 2020-11-15 LAB — MAGNESIUM: Magnesium: 1.8 mg/dL (ref 1.7–2.4)

## 2020-11-15 LAB — HIV ANTIBODY (ROUTINE TESTING W REFLEX): HIV Screen 4th Generation wRfx: NONREACTIVE

## 2020-11-15 LAB — PHOSPHORUS: Phosphorus: 4.1 mg/dL (ref 2.5–4.6)

## 2020-11-15 MED ORDER — ALBUTEROL SULFATE HFA 108 (90 BASE) MCG/ACT IN AERS: 2.0000 | INHALATION_SPRAY | RESPIRATORY_TRACT | Status: DC | PRN

## 2020-11-15 MED ORDER — BISOPROLOL FUMARATE 5 MG PO TABS
5.0000 mg | ORAL_TABLET | Freq: Every day | ORAL | Status: DC
  Administered 2020-11-15 – 2020-11-24 (×10): 5 mg via ORAL
  Filled 2020-11-15 (×10): qty 1

## 2020-11-15 MED ORDER — OXYCODONE HCL 5 MG PO TABS
5.0000 mg | ORAL_TABLET | Freq: Four times a day (QID) | ORAL | Status: DC | PRN
  Administered 2020-11-15 – 2020-11-17 (×6): 5 mg via ORAL
  Filled 2020-11-15 (×7): qty 1

## 2020-11-15 MED ORDER — DIPHENHYDRAMINE HCL 50 MG/ML IJ SOLN: 25.0000 mg | Freq: Four times a day (QID) | INTRAMUSCULAR | Status: DC | PRN

## 2020-11-15 MED ORDER — ACETAMINOPHEN 500 MG PO TABS
1000.0000 mg | ORAL_TABLET | Freq: Four times a day (QID) | ORAL | Status: DC | PRN
  Administered 2020-11-15 – 2020-11-16 (×2): 1000 mg via ORAL
  Filled 2020-11-15 (×2): qty 2

## 2020-11-15 MED ORDER — METHOCARBAMOL 1000 MG/10ML IJ SOLN
500.0000 mg | Freq: Three times a day (TID) | INTRAVENOUS | Status: DC | PRN
  Administered 2020-11-17: 12:00:00 500 mg via INTRAVENOUS
  Filled 2020-11-15 (×2): qty 5

## 2020-11-15 MED ORDER — BUMETANIDE 1 MG PO TABS
1.0000 mg | ORAL_TABLET | Freq: Every day | ORAL | Status: DC
  Administered 2020-11-15 – 2020-11-19 (×4): 1 mg via ORAL
  Filled 2020-11-15 (×5): qty 1

## 2020-11-15 MED ORDER — ROSUVASTATIN CALCIUM 5 MG PO TABS
10.0000 mg | ORAL_TABLET | Freq: Every day | ORAL | Status: DC
  Administered 2020-11-15 – 2020-11-28 (×13): 10 mg via ORAL
  Filled 2020-11-15 (×13): qty 2

## 2020-11-15 MED ORDER — PANTOPRAZOLE SODIUM 20 MG PO TBEC
20.0000 mg | DELAYED_RELEASE_TABLET | Freq: Every day | ORAL | Status: DC
  Administered 2020-11-15 – 2020-11-28 (×13): 20 mg via ORAL
  Filled 2020-11-15 (×13): qty 1

## 2020-11-15 NOTE — Progress Notes (Signed)
OT Cancellation Note  Patient Details Name: Margaret [name] MRN: [id number] DOB: 1956/10/30   Cancelled Treatment:    Reason Eval/Treat Not Completed: Patient not medically ready. Pt still awaiting surgery for hip.  [place], OTR/L Acute Rehab Services Pager [phone] Office [phone]     Almon Register 11/15/2020, 6:13 AM

## 2020-11-15 NOTE — Progress Notes (Signed)
    Subjective: Margaret [name] is a 64 y.o. female withpast medical history significant for endocarditis, CHF, COPD, obesity, pulmonary hypertension, valve repair,subarachnoid bleed, CVA, diabetes who presents for evaluation after fall. She had a left sided weakness after prior stroke. She just discontinued using a wheelchair in August of this year. She normally ambulates with a cane. She fell yesterday and had immediate pain in her left hip with inability to bear weight. Orthopedics was consulted for left femur fracture. Pt. Is on Eliquis which has been held.  Pt. Reports constant pain in the left hip rated at a 7/10. Denies SOB, CP, Fever/Chills, Paraesthesia   Objective:   VITALS:   Vitals:   11/14/20 2030 11/14/20 2152 11/15/20 0259 11/15/20 0922  BP: 114/69 (!) 134/95 134/67 (!) 107/55  Pulse: 79 86 83 82  Resp: 13 18 18 20   Temp:  (!) 97.4 F (36.3 C) (!) 97.4 F (36.3 C) 98.2 F (36.8 C)  TempSrc:  Oral Oral Oral  SpO2: 94% 90% 98% 90%  Weight:      Height:       CBC Latest Ref Rng & Units 11/15/2020 11/14/2020 06/17/2016  WBC 4.0 - 10.5 K/uL 15.2(H) 14.4(H) 10.2  Hemoglobin 12.0 - 15.0 g/dL 14.6 14.8 13.4  Hematocrit 36.0 - 46.0 % 46.5(H) 48.3(H) 43.9  Platelets 150 - 400 K/uL 412(H) 417(H) 591(H)   BMP Latest Ref Rng & Units 11/15/2020 11/14/2020 06/17/2016  Glucose 70 - 99 mg/dL 157(H) 193(H) 74  BUN 8 - 23 mg/dL 15 14 23   Creatinine 0.44 - 1.00 mg/dL 1.24(H) 1.19(H) 1.00(H)  Sodium 135 - 145 mmol/L 137 135 138  Potassium 3.5 - 5.1 mmol/L 4.3 4.6 5.2  Chloride 98 - 111 mmol/L 96(L) 100 99  CO2 22 - 32 mmol/L 33(H) 24 32(H)  Calcium 8.9 - 10.3 mg/dL 9.8 9.4 11.1(H)   Intake/Output    None      Physical Exam: General: NAD.  Resting in bed Resp: No increased wob Cardio: regular rate and rhythm ABD soft Neurologically intact MSK Neurovascularly intact Sensation intact distally Intact pulses distally Dorsiflexion/Plantar flexion intact Incision:  dressing C/D/I Neurovascular intact Compartment soft  Assessment:   Left intertrochantric fracture    Principal Problem:   Closed left femoral fracture (HCC) Active Problems:   COPD (chronic obstructive pulmonary disease) (HCC)   Chronic diastolic CHF (congestive heart failure) (HCC)   Morbid obesity (HCC)   Essential hypertension   HLD (hyperlipidemia)   History of CVA with residual deficit   Hyperglycemia due to diabetes mellitus ([place])   Fall at home, initial encounter    Plan: Will plan for IMN of the left hip on 11/17/20 Incentive Spirometry Elevate and Apply ice  Weightbearing: NWB LLE Orthopedic device(s): None Showering: Keep dressing dry VTE prophylaxis: Will plan for resuming of Eliquis after surgery  SCDs. Pain control: oxycodone, robaxin, tylenol Contact information:  Edmonia Lynch MD, Margy Clarks PA-C  Dispo: Skilled Nursing Facility/Rehab     Rachael Fee, Hershal Coria 11/15/2020, 1:52 PM

## 2020-11-15 NOTE — Progress Notes (Signed)
PT Cancellation Note  Patient Details Name: Margaret [name] MRN: [id number] DOB: 1956/03/16   Cancelled Treatment:    Reason Eval/Treat Not Completed: Patient not medically ready. Pt awaiting surgery 12/26. PT will continue to follow and initiate evaluation s/p surgery.    Thelma Comp 11/15/2020, 7:37 AM   Rolinda Roan, PT, DPT Acute Rehabilitation Services Pager: [phone] Office: [phone]

## 2020-11-15 NOTE — Progress Notes (Signed)
PROGRESS NOTE    Margaret [name]  XLK:[id number] DOB: [birth year] DOA: 11/14/2020 PCP: Patient, No Pcp Per     Brief Narrative:  64 y.o. WF PMHx Panic attacks, chronic Diastolic CHF, s/p Mitral Valve repair with Annoloplasty on Eliquis, Endocarditis, COPD, Pulmonary HTN, Obesity ,Subarachnoid bleed, CVA, Subarachnoid bleed DM type II controlled with hyperglycemia, Splenic infarction,   Presents to the emergency department after sustaining fall at home.  Patient has left sided weakness at baseline due to prior stroke and ambulates with a cane.  She states that she slipped and fell while at home, she does not think she hit her head.  Left hip pain was rated as 10/10 on pain scale without any aggravating or alleviating factors she denies headache, dizziness, chest pain, shortness of breath or abdominal pain.  ED Course:  In the emergency department, she was hemodynamically stable.  Work-up in the ED showed leukocytosis, thrombocytosis. CT head without contrast showed no evidence of acute intracranial abnormality CT cervical spine showed no evidence of acute fracture or traumatic malalignment but showed a moderate degenerative disc disease at C6-C7. Left hip x-ray showed intertrochanteric femur fracture on the left with varus angulation at the fracture site.  IV opioid medications were given.  Orthopedic surgery was consulted by ED team.  Hospitalist was asked to admit patient for further evaluation and management   Subjective: A/O x4, positive pain in LEFT hip appropriate for new fracture.  Patient anxious about upcoming surgery appropriately.   Assessment & Plan: Covid vaccination; vaccinated   Principal Problem:   Closed left femoral fracture (HCC) Active Problems:   COPD (chronic obstructive pulmonary disease) (HCC)   Chronic diastolic CHF (congestive heart failure) (HCC)   Morbid obesity (HCC)   Essential hypertension   HLD (hyperlipidemia)   History of CVA with residual  deficit   Hyperglycemia due to diabetes mellitus (HCC)   Fall at home, initial encounter  LEFT femoral intertrochanteric fracture secondary to mechanical fall -Scheduled for operative repair Sunday, secondary to patient being on Eliquis.Marland Kitchen -LEFT hip x-ray; showed intertrochanteric femur fracture on the left with varus angulation at the fracture site.  -Pain medication per orthopedic surgery.  -Fall precautions -Neuro checks  Chronic diastolic CHF -Bisoprolol 5 mg daily -Bumex 1 mg daily -Strict I&O -Daily weight  Essential HTN -See CHF  Mitral valve repair -On Eliquis (hold ) secondary to upcoming surgery  COPD -Per patient not on home O2 or CPAP -Albuterol PRN  Hx of CVA -Hx residual left-sided weakness  DM type II uncontrolled with hyperglycemia -12/23 hemoglobin A1c= 8.5 -Moderate SSI  Leukocytosis/Thrombocytosis -Most likely reactive given patient has negative fever, negative bands, negative left shift  HLD -Lipid panel pending -Crestor 10 mg daily  Morbid obesity -Post surgery will benefit from nutritional consult for lifestyle modification    DVT prophylaxis: Eliquis (on hold) for Sunday surgery Code Status: Full Family Communication:  Status is: Inpatient    Dispo: The patient is from: Home              Anticipated d/c is to: SNF              Anticipated d/c date is: 12/30              Patient currently unstable      Consultants:  Orthopedic surgery  Procedures/Significant Events:    I have personally reviewed and interpreted all radiology studies and my findings are as above.  VENTILATOR SETTINGS:    Cultures  Antimicrobials:    Devices    LINES / TUBES:      Continuous Infusions:   Objective: Vitals:   11/14/20 2015 11/14/20 2030 11/14/20 2152 11/15/20 0259  BP: 115/64 114/69 (!) 134/95 134/67  Pulse: 79 79 86 83  Resp: 10 13 18 18   Temp:   (!) 97.4 F (36.3 C) (!) 97.4 F (36.3 C)  TempSrc:   Oral Oral   SpO2: 90% 94% 90% 98%  Weight:      Height:       No intake or output data in the 24 hours ending 11/15/20 0914 Filed Weights   11/14/20 1443  Weight: 127.5 kg    Examination:  General: A/O x4, No acute respiratory distress Eyes: negative scleral hemorrhage, negative anisocoria, negative icterus ENT: Negative Runny nose, negative gingival bleeding, Neck:  Negative scars, masses, torticollis, lymphadenopathy, JVD Lungs: Clear to auscultation bilaterally without wheezes or crackles Cardiovascular: Regular rate and rhythm without murmur gallop or rub normal S1 and S2 Abdomen: negative abdominal pain, nondistended, positive soft, bowel sounds, no rebound, no ascites, no appreciable mass Extremities: LEFT lower extremity abducted at abnormal angle secondary to LEFT hip fracture Skin: Negative rashes, lesions, ulcers Psychiatric:  Negative depression, negative anxiety, negative fatigue, negative mania  Central nervous system:  Cranial nerves II through XII intact, tongue/uvula midline, all extremities muscle strength 5/5, sensation intact throughout, negative dysarthria, negative expressive aphasia, negative receptive aphasia.  .     Data Reviewed: Care during the described time interval was provided by me .  I have reviewed this patient's available data, including medical history, events of note, physical examination, and all test results as part of my evaluation.  CBC: Recent Labs  Lab 11/14/20 1444 11/15/20 0304  WBC 14.4* 15.2*  NEUTROABS 11.8*  --   HGB 14.8 14.6  HCT 48.3* 46.5*  MCV 88.8 89.1  PLT 417* 412*   Basic Metabolic Panel: Recent Labs  Lab 11/14/20 1444 11/15/20 0304  NA 135 137  K 4.6 4.3  CL 100 96*  CO2 24 33*  GLUCOSE 193* 157*  BUN 14 15  CREATININE 1.19* 1.24*  CALCIUM 9.4 9.8  MG  --  1.8  PHOS  --  4.1   GFR: Estimated Creatinine Clearance: 60.6 mL/min (A) (by C-G formula based on SCr of 1.24 mg/dL (H)). Liver Function Tests: Recent  Labs  Lab 11/15/20 0304  AST 23  ALT 22  ALKPHOS 91  BILITOT 1.0  PROT 7.1  ALBUMIN 3.0*   No results for input(s): LIPASE, AMYLASE in the last 168 hours. No results for input(s): AMMONIA in the last 168 hours. Coagulation Profile: Recent Labs  Lab 11/14/20 1703 11/15/20 0304  INR 1.2 1.1   Cardiac Enzymes: No results for input(s): CKTOTAL, CKMB, CKMBINDEX, TROPONINI in the last 168 hours. BNP (last 3 results) No results for input(s): PROBNP in the last 8760 hours. HbA1C: Recent Labs    11/14/20 2139  HGBA1C 8.5*   CBG: Recent Labs  Lab 11/14/20 2143 11/15/20 0654  GLUCAP 188* 146*   Lipid Profile: No results for input(s): CHOL, HDL, LDLCALC, TRIG, CHOLHDL, LDLDIRECT in the last 72 hours. Thyroid Function Tests: No results for input(s): TSH, T4TOTAL, FREET4, T3FREE, THYROIDAB in the last 72 hours. Anemia Panel: No results for input(s): VITAMINB12, FOLATE, FERRITIN, TIBC, IRON, RETICCTPCT in the last 72 hours. Sepsis Labs: No results for input(s): PROCALCITON, LATICACIDVEN in the last 168 hours.  Recent Results (from the past 240 hour(s))  Resp Panel  by RT-PCR (Flu A&B, Covid) Nasopharyngeal Swab     Status: None   Collection Time: 11/14/20  2:44 PM   Specimen: Nasopharyngeal Swab; Nasopharyngeal(NP) swabs in vial transport medium  Result Value Ref Range Status   SARS Coronavirus 2 by RT PCR NEGATIVE NEGATIVE Final    Comment: (NOTE) SARS-CoV-2 target nucleic acids are NOT DETECTED.  The SARS-CoV-2 RNA is generally detectable in upper respiratory specimens during the acute phase of infection. The lowest concentration of SARS-CoV-2 viral copies this assay can detect is 138 copies/mL. A negative result does not preclude SARS-Cov-2 infection and should not be used as the sole basis for treatment or other patient management decisions. A negative result may occur with  improper specimen collection/handling, submission of specimen other than nasopharyngeal swab,  presence of viral mutation(s) within the areas targeted by this assay, and inadequate number of viral copies(<138 copies/mL). A negative result must be combined with clinical observations, patient history, and epidemiological information. The expected result is Negative.  Fact Sheet for Patients:  BloggerCourse.com  Fact Sheet for Healthcare Providers:  SeriousBroker.it  This test is no t yet approved or cleared by the [place] FDA and  has been authorized for detection and/or diagnosis of SARS-CoV-2 by FDA under an Emergency Use Authorization (EUA). This EUA will remain  in effect (meaning this test can be used) for the duration of the COVID-19 declaration under Section 564(b)(1) of the Act, 21 U.S.C.section 360bbb-3(b)(1), unless the authorization is terminated  or revoked sooner.       Influenza A by PCR NEGATIVE NEGATIVE Final   Influenza B by PCR NEGATIVE NEGATIVE Final    Comment: (NOTE) The Xpert Xpress SARS-CoV-2/FLU/RSV plus assay is intended as an aid in the diagnosis of influenza from Nasopharyngeal swab specimens and should not be used as a sole basis for treatment. Nasal washings and aspirates are unacceptable for Xpert Xpress SARS-CoV-2/FLU/RSV testing.  Fact Sheet for Patients: BloggerCourse.com  Fact Sheet for Healthcare Providers: SeriousBroker.it  This test is not yet approved or cleared by the [place] FDA and has been authorized for detection and/or diagnosis of SARS-CoV-2 by FDA under an Emergency Use Authorization (EUA). This EUA will remain in effect (meaning this test can be used) for the duration of the COVID-19 declaration under Section 564(b)(1) of the Act, 21 U.S.C. section 360bbb-3(b)(1), unless the authorization is terminated or revoked.  Performed at [facility], [address] [place], [place] [address]           Radiology Studies: CT Head Wo Contrast  Result Date: 11/14/2020 CLINICAL DATA:  Trauma.  Coagulopathy.  Fall. EXAM: CT HEAD WITHOUT CONTRAST CT CERVICAL SPINE WITHOUT CONTRAST TECHNIQUE: Multidetector CT imaging of the head and cervical spine was performed following the standard protocol without intravenous contrast. Multiplanar CT image reconstructions of the cervical spine were also generated. COMPARISON:  CT of the neck from August 31, 2019. CTA head Apr 22, 2016. MRI February 07, 2016. FINDINGS: CT HEAD FINDINGS Brain: Progressive encephalomalacia and gliosis associated with multiple remote right posterior MCA territory infarcts. No evidence of acute large vascular territory infarct. Additional scattered white matter hypodensities likely represent the sequela of chronic microvascular ischemic disease. No acute hemorrhage. No mass lesion. No abnormal mass effect. No hydrocephalus. Vascular: Calcific atherosclerosis. Skull: No acute fracture. Sinuses/Orbits: Mild scattered paranasal sinus mucosal thickening with trace frothy secretions in the right sphenoid sinus. Unremarkable orbits. Other: No mastoid effusions. CT CERVICAL SPINE FINDINGS Alignment: Straightening of the normal cervical  lordosis. No substantial sagittal subluxation. Mild broad dextrocurvature. Skull base and vertebrae: No evidence of acute fracture. Vertebral body heights are maintained. Soft tissues and spinal canal: No prevertebral fluid or swelling. No visible canal hematoma. Disc levels: Moderate degenerative disc disease at C6-C7 where there is a calcified right eccentric disc protrusion. Upper chest: Partially imaged interlobular septal thickening in the lung apices. Other: Calcific atherosclerosis of the carotid arteries. IMPRESSION: CT head: 1. No evidence of acute intracranial abnormality. 2. Progressive encephalomalacia and gliosis associated with multiple remote right MCA territory infarcts CT cervical spine: 1. No evidence  of acute fracture or traumatic malalignment. 2. Moderate degenerative disc disease at C6-C7 where there is a calcified right eccentric disc protrusion. 3. Partially imaged interlobular septal thickening in the lung apices, suggestive of interstitial pulmonary edema. Electronically Signed   By: Feliberto Harts MD   On: 11/14/2020 16:50   CT Cervical Spine Wo Contrast  Result Date: 11/14/2020 CLINICAL DATA:  Trauma.  Coagulopathy.  Fall. EXAM: CT HEAD WITHOUT CONTRAST CT CERVICAL SPINE WITHOUT CONTRAST TECHNIQUE: Multidetector CT imaging of the head and cervical spine was performed following the standard protocol without intravenous contrast. Multiplanar CT image reconstructions of the cervical spine were also generated. COMPARISON:  CT of the neck from August 31, 2019. CTA head Apr 22, 2016. MRI February 07, 2016. FINDINGS: CT HEAD FINDINGS Brain: Progressive encephalomalacia and gliosis associated with multiple remote right posterior MCA territory infarcts. No evidence of acute large vascular territory infarct. Additional scattered white matter hypodensities likely represent the sequela of chronic microvascular ischemic disease. No acute hemorrhage. No mass lesion. No abnormal mass effect. No hydrocephalus. Vascular: Calcific atherosclerosis. Skull: No acute fracture. Sinuses/Orbits: Mild scattered paranasal sinus mucosal thickening with trace frothy secretions in the right sphenoid sinus. Unremarkable orbits. Other: No mastoid effusions. CT CERVICAL SPINE FINDINGS Alignment: Straightening of the normal cervical lordosis. No substantial sagittal subluxation. Mild broad dextrocurvature. Skull base and vertebrae: No evidence of acute fracture. Vertebral body heights are maintained. Soft tissues and spinal canal: No prevertebral fluid or swelling. No visible canal hematoma. Disc levels: Moderate degenerative disc disease at C6-C7 where there is a calcified right eccentric disc protrusion. Upper chest: Partially  imaged interlobular septal thickening in the lung apices. Other: Calcific atherosclerosis of the carotid arteries. IMPRESSION: CT head: 1. No evidence of acute intracranial abnormality. 2. Progressive encephalomalacia and gliosis associated with multiple remote right MCA territory infarcts CT cervical spine: 1. No evidence of acute fracture or traumatic malalignment. 2. Moderate degenerative disc disease at C6-C7 where there is a calcified right eccentric disc protrusion. 3. Partially imaged interlobular septal thickening in the lung apices, suggestive of interstitial pulmonary edema. Electronically Signed   By: Feliberto Harts MD   On: 11/14/2020 16:50   DG Chest Portable 1 View  Result Date: 11/14/2020 CLINICAL DATA:  Fall, possible hip fracture EXAM: PORTABLE CHEST 1 VIEW COMPARISON:  03/07/2018 FINDINGS: Upper normal size of cardiac silhouette post median sternotomy. Atherosclerotic calcification aorta. Rotated to the LEFT. Accentuation of interstitial markings similar to previous exam. No acute infiltrate, pleural effusion or pneumothorax. Bones appear demineralized. IMPRESSION: Chronic accentuation of interstitial markings. No acute abnormalities. Aortic Atherosclerosis (ICD10-I70.0). Electronically Signed   By: Ulyses Southward M.D.   On: 11/14/2020 15:26   DG Hip Unilat W or Wo Pelvis 2-3 Views Left  Result Date: 11/14/2020 CLINICAL DATA:  Pain following fall EXAM: DG HIP (WITH OR WITHOUT PELVIS) 2-3V LEFT COMPARISON:  None. FINDINGS: Frontal pelvis as well as  frontal and lateral left hip images were obtained. There is a comminuted intertrochanteric femur fracture on the left with varus angulation at fracture site. No other fracture. No dislocation. There is mild symmetric narrowing of each hip joint. IMPRESSION: Intertrochanteric femur fracture on the left with varus angulation at the fracture site. No other fracture. No dislocation. Symmetric narrowing each hip joint. Electronically Signed   By:  Bretta Bang III M.D.   On: 11/14/2020 15:24        Scheduled Meds: . bisoprolol  5 mg Oral Daily  . bumetanide  1 mg Oral Daily  . insulin aspart  0-15 Units Subcutaneous TID WC  . insulin aspart  0-5 Units Subcutaneous QHS  . mupirocin ointment  1 application Nasal BID  . pantoprazole  20 mg Oral Daily  . rosuvastatin  10 mg Oral Daily   Continuous Infusions:   LOS: 1 day    Time spent:40 min    Prayan Ulin, Roselind Messier, MD [facility] Pager [phone]  If 7PM-7AM, please contact night-coverage [email or web address] Password [facility] 11/15/2020, 9:14 AM

## 2020-11-15 NOTE — Plan of Care (Signed)

## 2020-11-15 NOTE — Progress Notes (Signed)
IV attempt x1 unsuccessful. Placed IV team consult to start another IV on patient.

## 2020-11-16 DIAGNOSIS — W19XXXA Unspecified fall, initial encounter: Secondary | ICD-10-CM | POA: Diagnosis not present

## 2020-11-16 DIAGNOSIS — J431 Panlobular emphysema: Secondary | ICD-10-CM | POA: Diagnosis not present

## 2020-11-16 DIAGNOSIS — I5032 Chronic diastolic (congestive) heart failure: Secondary | ICD-10-CM | POA: Diagnosis not present

## 2020-11-16 DIAGNOSIS — S72142A Displaced intertrochanteric fracture of left femur, initial encounter for closed fracture: Secondary | ICD-10-CM | POA: Diagnosis not present

## 2020-11-16 LAB — COMPREHENSIVE METABOLIC PANEL
ALT: 16 U/L (ref 0–44)
AST: 14 U/L — ABNORMAL LOW (ref 15–41)
Albumin: 2.8 g/dL — ABNORMAL LOW (ref 3.5–5.0)
Alkaline Phosphatase: 83 U/L (ref 38–126)
Anion gap: 8 (ref 5–15)
BUN: 18 mg/dL (ref 8–23)
CO2: 30 mmol/L (ref 22–32)
Calcium: 9.6 mg/dL (ref 8.9–10.3)
Chloride: 94 mmol/L — ABNORMAL LOW (ref 98–111)
Creatinine, Ser: 1.34 mg/dL — ABNORMAL HIGH (ref 0.44–1.00)
GFR, Estimated: 44 mL/min — ABNORMAL LOW (ref >60)
Glucose, Bld: 151 mg/dL — ABNORMAL HIGH (ref 70–99)
Potassium: 4.2 mmol/L (ref 3.5–5.1)
Sodium: 132 mmol/L — ABNORMAL LOW (ref 135–145)
Total Bilirubin: 1.3 mg/dL — ABNORMAL HIGH (ref 0.3–1.2)
Total Protein: 7 g/dL (ref 6.5–8.1)

## 2020-11-16 LAB — CBC WITH DIFFERENTIAL/PLATELET
Abs Immature Granulocytes: 0.06 10*3/uL (ref 0.00–0.07)
Basophils Absolute: 0.1 10*3/uL (ref 0.0–0.1)
Basophils Relative: 1 %
Eosinophils Absolute: 0.4 10*3/uL (ref 0.0–0.5)
Eosinophils Relative: 3 %
HCT: 45.5 % (ref 36.0–46.0)
Hemoglobin: 14.2 g/dL (ref 12.0–15.0)
Immature Granulocytes: 0 %
Lymphocytes Relative: 12 %
Lymphs Abs: 1.6 10*3/uL (ref 0.7–4.0)
MCH: 27.8 pg (ref 26.0–34.0)
MCHC: 31.2 g/dL (ref 30.0–36.0)
MCV: 89 fL (ref 80.0–100.0)
Monocytes Absolute: 0.7 10*3/uL (ref 0.1–1.0)
Monocytes Relative: 5 %
Neutro Abs: 10.6 10*3/uL — ABNORMAL HIGH (ref 1.7–7.7)
Neutrophils Relative %: 79 %
Platelets: 372 10*3/uL (ref 150–400)
RBC: 5.11 MIL/uL (ref 3.87–5.11)
RDW: 16.2 % — ABNORMAL HIGH (ref 11.5–15.5)
WBC: 13.5 10*3/uL — ABNORMAL HIGH (ref 4.0–10.5)
nRBC: 0 % (ref 0.0–0.2)

## 2020-11-16 LAB — GLUCOSE, CAPILLARY
Glucose-Capillary: 140 mg/dL — ABNORMAL HIGH (ref 70–99)
Glucose-Capillary: 240 mg/dL — ABNORMAL HIGH (ref 70–99)
Glucose-Capillary: 183 mg/dL — ABNORMAL HIGH (ref 70–99)
Glucose-Capillary: 180 mg/dL — ABNORMAL HIGH (ref 70–99)

## 2020-11-16 LAB — LIPID PANEL
Cholesterol: 104 mg/dL (ref 0–200)
HDL: 34 mg/dL — ABNORMAL LOW (ref >40)
LDL Cholesterol: 54 mg/dL (ref 0–99)
Total CHOL/HDL Ratio: 3.1 RATIO
Triglycerides: 81 mg/dL (ref <150)
VLDL: 16 mg/dL (ref 0–40)

## 2020-11-16 LAB — PHOSPHORUS: Phosphorus: 3.5 mg/dL (ref 2.5–4.6)

## 2020-11-16 LAB — MAGNESIUM: Magnesium: 1.8 mg/dL (ref 1.7–2.4)

## 2020-11-16 MED ORDER — POVIDONE-IODINE 10 % EX SWAB: 2.0000 "application " | Freq: Once | CUTANEOUS | Status: AC

## 2020-11-16 MED ORDER — CHLORHEXIDINE GLUCONATE 4 % EX LIQD
60.0000 mL | Freq: Once | CUTANEOUS | Status: AC
  Administered 2020-11-16: 4 via TOPICAL
  Filled 2020-11-16: qty 60

## 2020-11-16 MED ORDER — ENSURE PRE-SURGERY PO LIQD
296.0000 mL | Freq: Once | ORAL | Status: AC
  Administered 2020-11-16: 23:00:00 296 mL via ORAL
  Filled 2020-11-16: qty 296

## 2020-11-16 MED ORDER — DEXTROSE 5 % IV SOLN
3.0000 g | INTRAVENOUS | Status: AC
  Administered 2020-11-17: 09:00:00 3 g via INTRAVENOUS
  Filled 2020-11-16 (×2): qty 3000

## 2020-11-16 NOTE — Plan of Care (Signed)

## 2020-11-16 NOTE — Progress Notes (Signed)
PROGRESS NOTE    Margaret [name]  VQQ:[id number] DOB: 04/06/1956 DOA: 11/14/2020 PCP: Patient, No Pcp Per     Brief Narrative:  64 y.o. WF PMHx Panic attacks, chronic Diastolic CHF, s/p Mitral Valve repair with Annoloplasty on Eliquis, Endocarditis, COPD, Pulmonary HTN, Obesity ,Subarachnoid bleed, CVA, Subarachnoid bleed DM type II controlled with hyperglycemia, Splenic infarction,   Presents to the emergency department after sustaining fall at home.  Patient has left sided weakness at baseline due to prior stroke and ambulates with a cane.  She states that she slipped and fell while at home, she does not think she hit her head.  Left hip pain was rated as 10/10 on pain scale without any aggravating or alleviating factors she denies headache, dizziness, chest pain, shortness of breath or abdominal pain.  ED Course:  In the emergency department, she was hemodynamically stable.  Work-up in the ED showed leukocytosis, thrombocytosis. CT head without contrast showed no evidence of acute intracranial abnormality CT cervical spine showed no evidence of acute fracture or traumatic malalignment but showed a moderate degenerative disc disease at C6-C7. Left hip x-ray showed intertrochanteric femur fracture on the left with varus angulation at the fracture site.  IV opioid medications were given.  Orthopedic surgery was consulted by ED team.  Hospitalist was asked to admit patient for further evaluation and management   Subjective: 12/25 afebrile overnight A/O x4.  Patient waiting patiently for surgery on Monday.  States pain whenever she moves     Assessment & Plan: Covid vaccination; vaccinated   Principal Problem:   Closed left femoral fracture (HCC) Active Problems:   COPD (chronic obstructive pulmonary disease) (HCC)   Chronic diastolic CHF (congestive heart failure) (HCC)   Morbid obesity (HCC)   Essential hypertension   HLD (hyperlipidemia)   History of CVA with residual  deficit   Hyperglycemia due to diabetes mellitus (HCC)   Fall at home, initial encounter  LEFT femoral intertrochanteric fracture secondary to mechanical fall -Scheduled for operative repair Sunday, secondary to patient being on Eliquis.Marland Kitchen -LEFT hip x-ray; showed intertrochanteric femur fracture on the left with varus angulation at the fracture site.  -Pain medication per orthopedic surgery.  -Fall precautions -Neuro checks  Chronic diastolic CHF -Bisoprolol 5 mg daily -Bumex 1 mg daily -Strict I&O - -Daily weight Filed Weights   11/14/20 1443  Weight: 127.5 kg    Essential HTN -See CHF  Mitral valve repair -On Eliquis (hold ) secondary to upcoming surgery  COPD -Per patient not on home O2 or CPAP -Albuterol PRN  Hx of CVA -Hx residual left-sided weakness  DM type II uncontrolled with hyperglycemia -12/23 hemoglobin A1c= 8.5 -Moderate SSI  Leukocytosis -Most likely reactive given patient has negative fever, negative bands, negative left shift  Thrombocytosis -Resolved  HLD -Lipid panel pending -Crestor 10 mg daily  Morbid obesity -Post surgery will benefit from nutritional consult for lifestyle modification    DVT prophylaxis: Eliquis (on hold) for Sunday surgery Code Status: Full Family Communication:  Status is: Inpatient    Dispo: The patient is from: Home              Anticipated d/c is to: SNF              Anticipated d/c date is: 12/30              Patient currently unstable      Consultants:  Orthopedic surgery  Procedures/Significant Events:    I have personally reviewed and  interpreted all radiology studies and my findings are as above.  VENTILATOR SETTINGS:    Cultures   Antimicrobials:    Devices    LINES / TUBES:      Continuous Infusions: . methocarbamol (ROBAXIN) IV       Objective: Vitals:   11/15/20 0922 11/15/20 1651 11/15/20 2044 11/16/20 0432  BP: (!) 107/55 (!) 106/57 113/74 115/65   Pulse: 82 75 87 88  Resp: 20 18 20 16   Temp: 98.2 F (36.8 C) 98.2 F (36.8 C) 98.5 F (36.9 C) 98.3 F (36.8 C)  TempSrc: Oral Oral Oral Oral  SpO2: 90% 94% 95% 90%  Weight:      Height:        Intake/Output Summary (Last 24 hours) at 11/16/2020 1610 Last data filed at 11/16/2020 0500 Gross per 24 hour  Intake 480 ml  Output 500 ml  Net -20 ml   Filed Weights   11/14/20 1443  Weight: 127.5 kg    Examination:  General: A/O x4, No acute respiratory distress Eyes: negative scleral hemorrhage, negative anisocoria, negative icterus ENT: Negative Runny nose, negative gingival bleeding, Neck:  Negative scars, masses, torticollis, lymphadenopathy, JVD Lungs: Clear to auscultation bilaterally without wheezes or crackles Cardiovascular: Regular rate and rhythm without murmur gallop or rub normal S1 and S2 Abdomen: negative abdominal pain, nondistended, positive soft, bowel sounds, no rebound, no ascites, no appreciable mass Extremities: LEFT lower extremity abducted at abnormal angle secondary to LEFT hip fracture Skin: Negative rashes, lesions, ulcers Psychiatric:  Negative depression, negative anxiety, negative fatigue, negative mania  Central nervous system:  Cranial nerves II through XII intact, tongue/uvula midline, all extremities muscle strength 5/5, sensation intact throughout, negative dysarthria, negative expressive aphasia, negative receptive aphasia.  .     Data Reviewed: Care during the described time interval was provided by me .  I have reviewed this patient's available data, including medical history, events of note, physical examination, and all test results as part of my evaluation.  CBC: Recent Labs  Lab 11/14/20 1444 11/15/20 0304 11/16/20 0138  WBC 14.4* 15.2* 13.5*  NEUTROABS 11.8*  --  10.6*  HGB 14.8 14.6 14.2  HCT 48.3* 46.5* 45.5  MCV 88.8 89.1 89.0  PLT 417* 412* 372   Basic Metabolic Panel: Recent Labs  Lab 11/14/20 1444 11/15/20 0304  11/16/20 0138  NA 135 137 132*  K 4.6 4.3 4.2  CL 100 96* 94*  CO2 24 33* 30  GLUCOSE 193* 157* 151*  BUN 14 15 18   CREATININE 1.19* 1.24* 1.34*  CALCIUM 9.4 9.8 9.6  MG  --  1.8 1.8  PHOS  --  4.1 3.5   GFR: Estimated Creatinine Clearance: 56.1 mL/min (A) (by C-G formula based on SCr of 1.34 mg/dL (H)). Liver Function Tests: Recent Labs  Lab 11/15/20 0304 11/16/20 0138  AST 23 14*  ALT 22 16  ALKPHOS 91 83  BILITOT 1.0 1.3*  PROT 7.1 7.0  ALBUMIN 3.0* 2.8*   No results for input(s): LIPASE, AMYLASE in the last 168 hours. No results for input(s): AMMONIA in the last 168 hours. Coagulation Profile: Recent Labs  Lab 11/14/20 1703 11/15/20 0304  INR 1.2 1.1   Cardiac Enzymes: No results for input(s): CKTOTAL, CKMB, CKMBINDEX, TROPONINI in the last 168 hours. BNP (last 3 results) No results for input(s): PROBNP in the last 8760 hours. HbA1C: Recent Labs    11/14/20 2139  HGBA1C 8.5*   CBG: Recent Labs  Lab 11/15/20 0654 11/15/20 1158  11/15/20 1650 11/15/20 2044 11/16/20 0653  GLUCAP 146* 196* 120* 178* 140*   Lipid Profile: Recent Labs    11/16/20 0138  CHOL 104  HDL 34*  LDLCALC 54  TRIG 81  CHOLHDL 3.1   Thyroid Function Tests: No results for input(s): TSH, T4TOTAL, FREET4, T3FREE, THYROIDAB in the last 72 hours. Anemia Panel: No results for input(s): VITAMINB12, FOLATE, FERRITIN, TIBC, IRON, RETICCTPCT in the last 72 hours. Sepsis Labs: No results for input(s): PROCALCITON, LATICACIDVEN in the last 168 hours.  Recent Results (from the past 240 hour(s))  Resp Panel by RT-PCR (Flu A&B, Covid) Nasopharyngeal Swab     Status: None   Collection Time: 11/14/20  2:44 PM   Specimen: Nasopharyngeal Swab; Nasopharyngeal(NP) swabs in vial transport medium  Result Value Ref Range Status   SARS Coronavirus 2 by RT PCR NEGATIVE NEGATIVE Final    Comment: (NOTE) SARS-CoV-2 target nucleic acids are NOT DETECTED.  The SARS-CoV-2 RNA is generally  detectable in upper respiratory specimens during the acute phase of infection. The lowest concentration of SARS-CoV-2 viral copies this assay can detect is 138 copies/mL. A negative result does not preclude SARS-Cov-2 infection and should not be used as the sole basis for treatment or other patient management decisions. A negative result may occur with  improper specimen collection/handling, submission of specimen other than nasopharyngeal swab, presence of viral mutation(s) within the areas targeted by this assay, and inadequate number of viral copies(<138 copies/mL). A negative result must be combined with clinical observations, patient history, and epidemiological information. The expected result is Negative.  Fact Sheet for Patients:  BloggerCourse.com  Fact Sheet for Healthcare Providers:  SeriousBroker.it  This test is no t yet approved or cleared by the [place] FDA and  has been authorized for detection and/or diagnosis of SARS-CoV-2 by FDA under an Emergency Use Authorization (EUA). This EUA will remain  in effect (meaning this test can be used) for the duration of the COVID-19 declaration under Section 564(b)(1) of the Act, 21 U.S.C.section 360bbb-3(b)(1), unless the authorization is terminated  or revoked sooner.       Influenza A by PCR NEGATIVE NEGATIVE Final   Influenza B by PCR NEGATIVE NEGATIVE Final    Comment: (NOTE) The Xpert Xpress SARS-CoV-2/FLU/RSV plus assay is intended as an aid in the diagnosis of influenza from Nasopharyngeal swab specimens and should not be used as a sole basis for treatment. Nasal washings and aspirates are unacceptable for Xpert Xpress SARS-CoV-2/FLU/RSV testing.  Fact Sheet for Patients: BloggerCourse.com  Fact Sheet for Healthcare Providers: SeriousBroker.it  This test is not yet approved or cleared by the [place] FDA  and has been authorized for detection and/or diagnosis of SARS-CoV-2 by FDA under an Emergency Use Authorization (EUA). This EUA will remain in effect (meaning this test can be used) for the duration of the COVID-19 declaration under Section 564(b)(1) of the Act, 21 U.S.C. section 360bbb-3(b)(1), unless the authorization is terminated or revoked.  Performed at [facility], [address] [place], [place] [address]   Surgical PCR screen     Status: Abnormal   Collection Time: 11/15/20  5:37 AM   Specimen: Nasal Mucosa; Nasal Swab  Result Value Ref Range Status   MRSA, PCR POSITIVE (A) NEGATIVE Final    Comment: RESULT CALLED TO, READ BACK BY AND VERIFIED WITH: Redmond Baseman RN 9:45 11/15/20 ([email or web address])    Staphylococcus aureus POSITIVE (A) NEGATIVE Final    Comment: (NOTE) The Xpert SA Assay (FDA  approved for NASAL specimens in patients 86 years of age and older), is one component of a comprehensive surveillance program. It is not intended to diagnose infection nor to guide or monitor treatment. Performed at [facility], [address], [place], [place] [address]          Radiology Studies: CT Head Wo Contrast  Result Date: 11/14/2020 CLINICAL DATA:  Trauma.  Coagulopathy.  Fall. EXAM: CT HEAD WITHOUT CONTRAST CT CERVICAL SPINE WITHOUT CONTRAST TECHNIQUE: Multidetector CT imaging of the head and cervical spine was performed following the standard protocol without intravenous contrast. Multiplanar CT image reconstructions of the cervical spine were also generated. COMPARISON:  CT of the neck from August 31, 2019. CTA head Apr 22, 2016. MRI February 07, 2016. FINDINGS: CT HEAD FINDINGS Brain: Progressive encephalomalacia and gliosis associated with multiple remote right posterior MCA territory infarcts. No evidence of acute large vascular territory infarct. Additional scattered white matter hypodensities likely represent the sequela of chronic microvascular ischemic disease.  No acute hemorrhage. No mass lesion. No abnormal mass effect. No hydrocephalus. Vascular: Calcific atherosclerosis. Skull: No acute fracture. Sinuses/Orbits: Mild scattered paranasal sinus mucosal thickening with trace frothy secretions in the right sphenoid sinus. Unremarkable orbits. Other: No mastoid effusions. CT CERVICAL SPINE FINDINGS Alignment: Straightening of the normal cervical lordosis. No substantial sagittal subluxation. Mild broad dextrocurvature. Skull base and vertebrae: No evidence of acute fracture. Vertebral body heights are maintained. Soft tissues and spinal canal: No prevertebral fluid or swelling. No visible canal hematoma. Disc levels: Moderate degenerative disc disease at C6-C7 where there is a calcified right eccentric disc protrusion. Upper chest: Partially imaged interlobular septal thickening in the lung apices. Other: Calcific atherosclerosis of the carotid arteries. IMPRESSION: CT head: 1. No evidence of acute intracranial abnormality. 2. Progressive encephalomalacia and gliosis associated with multiple remote right MCA territory infarcts CT cervical spine: 1. No evidence of acute fracture or traumatic malalignment. 2. Moderate degenerative disc disease at C6-C7 where there is a calcified right eccentric disc protrusion. 3. Partially imaged interlobular septal thickening in the lung apices, suggestive of interstitial pulmonary edema. Electronically Signed   By: Feliberto Harts MD   On: 11/14/2020 16:50   CT Cervical Spine Wo Contrast  Result Date: 11/14/2020 CLINICAL DATA:  Trauma.  Coagulopathy.  Fall. EXAM: CT HEAD WITHOUT CONTRAST CT CERVICAL SPINE WITHOUT CONTRAST TECHNIQUE: Multidetector CT imaging of the head and cervical spine was performed following the standard protocol without intravenous contrast. Multiplanar CT image reconstructions of the cervical spine were also generated. COMPARISON:  CT of the neck from August 31, 2019. CTA head Apr 22, 2016. MRI February 07, 2016.  FINDINGS: CT HEAD FINDINGS Brain: Progressive encephalomalacia and gliosis associated with multiple remote right posterior MCA territory infarcts. No evidence of acute large vascular territory infarct. Additional scattered white matter hypodensities likely represent the sequela of chronic microvascular ischemic disease. No acute hemorrhage. No mass lesion. No abnormal mass effect. No hydrocephalus. Vascular: Calcific atherosclerosis. Skull: No acute fracture. Sinuses/Orbits: Mild scattered paranasal sinus mucosal thickening with trace frothy secretions in the right sphenoid sinus. Unremarkable orbits. Other: No mastoid effusions. CT CERVICAL SPINE FINDINGS Alignment: Straightening of the normal cervical lordosis. No substantial sagittal subluxation. Mild broad dextrocurvature. Skull base and vertebrae: No evidence of acute fracture. Vertebral body heights are maintained. Soft tissues and spinal canal: No prevertebral fluid or swelling. No visible canal hematoma. Disc levels: Moderate degenerative disc disease at C6-C7 where there is a calcified right eccentric disc protrusion. Upper chest:  Partially imaged interlobular septal thickening in the lung apices. Other: Calcific atherosclerosis of the carotid arteries. IMPRESSION: CT head: 1. No evidence of acute intracranial abnormality. 2. Progressive encephalomalacia and gliosis associated with multiple remote right MCA territory infarcts CT cervical spine: 1. No evidence of acute fracture or traumatic malalignment. 2. Moderate degenerative disc disease at C6-C7 where there is a calcified right eccentric disc protrusion. 3. Partially imaged interlobular septal thickening in the lung apices, suggestive of interstitial pulmonary edema. Electronically Signed   By: Feliberto Harts MD   On: 11/14/2020 16:50   DG Chest Portable 1 View  Result Date: 11/14/2020 CLINICAL DATA:  Fall, possible hip fracture EXAM: PORTABLE CHEST 1 VIEW COMPARISON:  03/07/2018 FINDINGS: Upper  normal size of cardiac silhouette post median sternotomy. Atherosclerotic calcification aorta. Rotated to the LEFT. Accentuation of interstitial markings similar to previous exam. No acute infiltrate, pleural effusion or pneumothorax. Bones appear demineralized. IMPRESSION: Chronic accentuation of interstitial markings. No acute abnormalities. Aortic Atherosclerosis (ICD10-I70.0). Electronically Signed   By: Ulyses Southward M.D.   On: 11/14/2020 15:26   DG Hip Unilat W or Wo Pelvis 2-3 Views Left  Result Date: 11/14/2020 CLINICAL DATA:  Pain following fall EXAM: DG HIP (WITH OR WITHOUT PELVIS) 2-3V LEFT COMPARISON:  None. FINDINGS: Frontal pelvis as well as frontal and lateral left hip images were obtained. There is a comminuted intertrochanteric femur fracture on the left with varus angulation at fracture site. No other fracture. No dislocation. There is mild symmetric narrowing of each hip joint. IMPRESSION: Intertrochanteric femur fracture on the left with varus angulation at the fracture site. No other fracture. No dislocation. Symmetric narrowing each hip joint. Electronically Signed   By: Bretta Bang III M.D.   On: 11/14/2020 15:24        Scheduled Meds: . bisoprolol  5 mg Oral Daily  . bumetanide  1 mg Oral Daily  . insulin aspart  0-15 Units Subcutaneous TID WC  . insulin aspart  0-5 Units Subcutaneous QHS  . mupirocin ointment  1 application Nasal BID  . pantoprazole  20 mg Oral Daily  . rosuvastatin  10 mg Oral Daily   Continuous Infusions: . methocarbamol (ROBAXIN) IV       LOS: 2 days    Time spent:40 min    [place], Roselind Messier, MD [facility] Pager [phone]  If 7PM-7AM, please contact night-coverage [email or web address] Password [email or web address] 11/16/2020, 9:10 AM

## 2020-11-16 NOTE — H&P (View-Only) (Signed)
    Subjective: Margaret [name] is a 64 y.o. female withpast medical history significant for endocarditis, CHF, COPD, obesity, pulmonary hypertension, valve repair,subarachnoid bleed, CVA, diabetes who presents for evaluation after fall. She had a left sided weakness after prior stroke. She just discontinued using a wheelchair in August of this year. She normally ambulates with a cane. She fell yesterday and had immediate pain in her left hip with inability to bear weight. Orthopedics was consulted for left femur fracture. Pt. Is on Eliquis which has been held.  Pt. Reports constant pain in the left hip rated at a 7/10. Denies SOB, CP, Fever/Chills, Paraesthesia   Objective:   VITALS:   Vitals:   11/15/20 0922 11/15/20 1651 11/15/20 2044 11/16/20 0432  BP: (!) 107/55 (!) 106/57 113/74 115/65  Pulse: 82 75 87 88  Resp: 20 18 20 16   Temp: 98.2 F (36.8 C) 98.2 F (36.8 C) 98.5 F (36.9 C) 98.3 F (36.8 C)  TempSrc: Oral Oral Oral Oral  SpO2: 90% 94% 95% 90%  Weight:      Height:       CBC Latest Ref Rng & Units 11/16/2020 11/15/2020 11/14/2020  WBC 4.0 - 10.5 K/uL 13.5(H) 15.2(H) 14.4(H)  Hemoglobin 12.0 - 15.0 g/dL 14.2 14.6 14.8  Hematocrit 36.0 - 46.0 % 45.5 46.5(H) 48.3(H)  Platelets 150 - 400 K/uL 372 412(H) 417(H)   BMP Latest Ref Rng & Units 11/16/2020 11/15/2020 11/14/2020  Glucose 70 - 99 mg/dL 151(H) 157(H) 193(H)  BUN 8 - 23 mg/dL 18 15 14   Creatinine 0.44 - 1.00 mg/dL 1.34(H) 1.24(H) 1.19(H)  Sodium 135 - 145 mmol/L 132(L) 137 135  Potassium 3.5 - 5.1 mmol/L 4.2 4.3 4.6  Chloride 98 - 111 mmol/L 94(L) 96(L) 100  CO2 22 - 32 mmol/L 30 33(H) 24  Calcium 8.9 - 10.3 mg/dL 9.6 9.8 9.4   Intake/Output      12/24 0701 12/25 0700 12/25 0701 12/26 0700   P.O. 720    Total Intake(mL/kg) 720 (5.6)    Urine (mL/kg/hr) 500 (0.2)    Total Output 500    Net +220            Physical Exam: General: NAD.  Resting in bed Resp: No increased wob Cardio: regular  rate and rhythm ABD soft Neurologically intact MSK Neurovascularly intact Sensation intact distally Intact pulses distally Dorsiflexion/Plantar flexion intact Incision: dressing C/D/I Neurovascular intact Compartment soft  Assessment:   Left intertrochantric fracture    Principal Problem:   Closed left femoral fracture (HCC) Active Problems:   COPD (chronic obstructive pulmonary disease) (HCC)   Chronic diastolic CHF (congestive heart failure) (HCC)   Morbid obesity (HCC)   Essential hypertension   HLD (hyperlipidemia)   History of CVA with residual deficit   Hyperglycemia due to diabetes mellitus ([place])   Fall at home, initial encounter    Plan: Will plan for IMN of the left hip on 11/17/20 NPO after MN tonight Incentive Spirometry Elevate and Apply ice  Weightbearing: NWB LLE Orthopedic device(s): None Showering: Keep dressing dry VTE prophylaxis: Will plan for resuming of Eliquis after surgery  SCDs. Pain control: oxycodone, robaxin, tylenol Contact information:  Edmonia Lynch MD, Margy Clarks PA-C  Dispo: Skilled Nursing Facility/Rehab     Rachael Fee, Hershal Coria 11/16/2020, 9:34 AM

## 2020-11-16 NOTE — Plan of Care (Signed)
  Problem: Education: Goal: Knowledge of General Education information will improve Description: Including pain rating scale, medication(s)/side effects and non-pharmacologic comfort measures 11/16/2020 0151 by Cephus Shelling, RN Outcome: Progressing 11/16/2020 0150 by Cephus Shelling, RN Outcome: Progressing   Problem: Health Behavior/Discharge Planning: Goal: Ability to manage health-related needs will improve 11/16/2020 0151 by Cephus Shelling, RN Outcome: Progressing 11/16/2020 0150 by Cephus Shelling, RN Outcome: Progressing   Problem: Activity: Goal: Risk for activity intolerance will decrease 11/16/2020 0151 by Cephus Shelling, RN Outcome: Progressing 11/16/2020 0150 by Cephus Shelling, RN Outcome: Progressing   Problem: Nutrition: Goal: Adequate nutrition will be maintained Outcome: Progressing   Problem: Pain Managment: Goal: General experience of comfort will improve 11/16/2020 0151 by Cephus Shelling, RN Outcome: Progressing 11/16/2020 0150 by Cephus Shelling, RN Outcome: Progressing   Problem: Safety: Goal: Ability to remain free from injury will improve 11/16/2020 0151 by Cephus Shelling, RN Outcome: Progressing 11/16/2020 0150 by Cephus Shelling, RN Outcome: Progressing   Problem: Skin Integrity: Goal: Risk for impaired skin integrity will decrease Outcome: Progressing

## 2020-11-16 NOTE — Progress Notes (Signed)
    Subjective: Margaret [name] is a 64 y.o. female withpast medical history significant for endocarditis, CHF, COPD, obesity, pulmonary hypertension, valve repair,subarachnoid bleed, CVA, diabetes who presents for evaluation after fall. She had a left sided weakness after prior stroke. She just discontinued using a wheelchair in August of this year. She normally ambulates with a cane. She fell yesterday and had immediate pain in her left hip with inability to bear weight. Orthopedics was consulted for left femur fracture. Pt. Is on Eliquis which has been held.  Pt. Reports constant pain in the left hip rated at a 7/10. Denies SOB, CP, Fever/Chills, Paraesthesia   Objective:   VITALS:   Vitals:   11/15/20 0922 11/15/20 1651 11/15/20 2044 11/16/20 0432  BP: (!) 107/55 (!) 106/57 113/74 115/65  Pulse: 82 75 87 88  Resp: 20 18 20 16  Temp: 98.2 F (36.8 C) 98.2 F (36.8 C) 98.5 F (36.9 C) 98.3 F (36.8 C)  TempSrc: Oral Oral Oral Oral  SpO2: 90% 94% 95% 90%  Weight:      Height:       CBC Latest Ref Rng & Units 11/16/2020 11/15/2020 11/14/2020  WBC 4.0 - 10.5 K/uL 13.5(H) 15.2(H) 14.4(H)  Hemoglobin 12.0 - 15.0 g/dL 14.2 14.6 14.8  Hematocrit 36.0 - 46.0 % 45.5 46.5(H) 48.3(H)  Platelets 150 - 400 K/uL 372 412(H) 417(H)   BMP Latest Ref Rng & Units 11/16/2020 11/15/2020 11/14/2020  Glucose 70 - 99 mg/dL 151(H) 157(H) 193(H)  BUN 8 - 23 mg/dL 18 15 14  Creatinine 0.44 - 1.00 mg/dL 1.34(H) 1.24(H) 1.19(H)  Sodium 135 - 145 mmol/L 132(L) 137 135  Potassium 3.5 - 5.1 mmol/L 4.2 4.3 4.6  Chloride 98 - 111 mmol/L 94(L) 96(L) 100  CO2 22 - 32 mmol/L 30 33(H) 24  Calcium 8.9 - 10.3 mg/dL 9.6 9.8 9.4   Intake/Output      12/24 0701 12/25 0700 12/25 0701 12/26 0700   P.O. 720    Total Intake(mL/kg) 720 (5.6)    Urine (mL/kg/hr) 500 (0.2)    Total Output 500    Net +220            Physical Exam: General: NAD.  Resting in bed Resp: No increased wob Cardio: regular  rate and rhythm ABD soft Neurologically intact MSK Neurovascularly intact Sensation intact distally Intact pulses distally Dorsiflexion/Plantar flexion intact Incision: dressing C/D/I Neurovascular intact Compartment soft  Assessment:   Left intertrochantric fracture    Principal Problem:   Closed left femoral fracture (HCC) Active Problems:   COPD (chronic obstructive pulmonary disease) (HCC)   Chronic diastolic CHF (congestive heart failure) (HCC)   Morbid obesity (HCC)   Essential hypertension   HLD (hyperlipidemia)   History of CVA with residual deficit   Hyperglycemia due to diabetes mellitus (HCC)   Fall at home, initial encounter    Plan: Will plan for IMN of the left hip on 11/17/20 NPO after MN tonight Incentive Spirometry Elevate and Apply ice  Weightbearing: NWB LLE Orthopedic device(s): None Showering: Keep dressing dry VTE prophylaxis: Will plan for resuming of Eliquis after surgery  SCDs. Pain control: oxycodone, robaxin, tylenol Contact information:  Margaret Murphy MD, Margaret Sutch PA-C  Dispo: Skilled Nursing Facility/Rehab     Margaret [name] M Margaret Velie, PA-C 11/16/2020, 9:34 AM 

## 2020-11-17 ENCOUNTER — Inpatient Hospital Stay (HOSPITAL_COMMUNITY): Payer: No Typology Code available for payment source | Admitting: Certified Registered Nurse Anesthetist

## 2020-11-17 ENCOUNTER — Encounter (HOSPITAL_COMMUNITY)
Admission: EM | Disposition: A | Discharge status: Skilled Nursing Facility | Payer: Self-pay | Source: Home / Self Care | Attending: Internal Medicine

## 2020-11-17 ENCOUNTER — Encounter (HOSPITAL_COMMUNITY): Payer: Self-pay | Admitting: Internal Medicine

## 2020-11-17 ENCOUNTER — Inpatient Hospital Stay (HOSPITAL_COMMUNITY): Payer: No Typology Code available for payment source

## 2020-11-17 HISTORY — PX: INTRAMEDULLARY (IM) NAIL INTERTROCHANTERIC: SHX5875

## 2020-11-17 LAB — CBC WITH DIFFERENTIAL/PLATELET
Abs Immature Granulocytes: 0.07 10*3/uL (ref 0.00–0.07)
Basophils Absolute: 0.1 10*3/uL (ref 0.0–0.1)
Basophils Relative: 1 %
Eosinophils Absolute: 0.4 10*3/uL (ref 0.0–0.5)
Eosinophils Relative: 3 %
HCT: 43.8 % (ref 36.0–46.0)
Hemoglobin: 13.4 g/dL (ref 12.0–15.0)
Immature Granulocytes: 1 %
Lymphocytes Relative: 12 %
Lymphs Abs: 1.7 10*3/uL (ref 0.7–4.0)
MCH: 26.9 pg (ref 26.0–34.0)
MCHC: 30.6 g/dL (ref 30.0–36.0)
MCV: 88 fL (ref 80.0–100.0)
Monocytes Absolute: 0.9 10*3/uL (ref 0.1–1.0)
Monocytes Relative: 6 %
Neutro Abs: 11.3 10*3/uL — ABNORMAL HIGH (ref 1.7–7.7)
Neutrophils Relative %: 77 %
Platelets: 352 10*3/uL (ref 150–400)
RBC: 4.98 MIL/uL (ref 3.87–5.11)
RDW: 15.9 % — ABNORMAL HIGH (ref 11.5–15.5)
WBC: 14.4 10*3/uL — ABNORMAL HIGH (ref 4.0–10.5)
nRBC: 0 % (ref 0.0–0.2)

## 2020-11-17 LAB — COMPREHENSIVE METABOLIC PANEL
ALT: 13 U/L (ref 0–44)
AST: 13 U/L — ABNORMAL LOW (ref 15–41)
Albumin: 2.4 g/dL — ABNORMAL LOW (ref 3.5–5.0)
Alkaline Phosphatase: 74 U/L (ref 38–126)
Anion gap: 9 (ref 5–15)
BUN: 21 mg/dL (ref 8–23)
CO2: 27 mmol/L (ref 22–32)
Calcium: 9.3 mg/dL (ref 8.9–10.3)
Chloride: 92 mmol/L — ABNORMAL LOW (ref 98–111)
Creatinine, Ser: 1.32 mg/dL — ABNORMAL HIGH (ref 0.44–1.00)
GFR, Estimated: 45 mL/min — ABNORMAL LOW (ref >60)
Glucose, Bld: 328 mg/dL — ABNORMAL HIGH (ref 70–99)
Potassium: 3.9 mmol/L (ref 3.5–5.1)
Sodium: 128 mmol/L — ABNORMAL LOW (ref 135–145)
Total Bilirubin: 1.1 mg/dL (ref 0.3–1.2)
Total Protein: 6.3 g/dL — ABNORMAL LOW (ref 6.5–8.1)

## 2020-11-17 LAB — GLUCOSE, CAPILLARY
Glucose-Capillary: 195 mg/dL — ABNORMAL HIGH (ref 70–99)
Glucose-Capillary: 159 mg/dL — ABNORMAL HIGH (ref 70–99)
Glucose-Capillary: 197 mg/dL — ABNORMAL HIGH (ref 70–99)
Glucose-Capillary: 193 mg/dL — ABNORMAL HIGH (ref 70–99)
Glucose-Capillary: 254 mg/dL — ABNORMAL HIGH (ref 70–99)
Glucose-Capillary: 327 mg/dL — ABNORMAL HIGH (ref 70–99)

## 2020-11-17 LAB — PHOSPHORUS: Phosphorus: 2.1 mg/dL — ABNORMAL LOW (ref 2.5–4.6)

## 2020-11-17 LAB — MAGNESIUM: Magnesium: 1.6 mg/dL — ABNORMAL LOW (ref 1.7–2.4)

## 2020-11-17 SURGERY — FIXATION, FRACTURE, INTERTROCHANTERIC, WITH INTRAMEDULLARY ROD
Anesthesia: General | Laterality: Left

## 2020-11-17 MED ORDER — APIXABAN 2.5 MG PO TABS
2.5000 mg | ORAL_TABLET | Freq: Two times a day (BID) | ORAL | Status: DC
  Administered 2020-11-18 – 2020-11-28 (×21): 2.5 mg via ORAL
  Filled 2020-11-17 (×21): qty 1

## 2020-11-17 MED ORDER — MIDAZOLAM HCL 2 MG/2ML IJ SOLN
INTRAMUSCULAR | Status: DC | PRN
  Administered 2020-11-17: 1 mg via INTRAVENOUS

## 2020-11-17 MED ORDER — LACTATED RINGERS IV SOLN: INTRAVENOUS | Status: DC

## 2020-11-17 MED ORDER — DOCUSATE SODIUM 100 MG PO CAPS
100.0000 mg | ORAL_CAPSULE | Freq: Two times a day (BID) | ORAL | Status: DC
  Administered 2020-11-17 – 2020-11-27 (×15): 100 mg via ORAL
  Filled 2020-11-17 (×20): qty 1

## 2020-11-17 MED ORDER — CHLORHEXIDINE GLUCONATE 0.12 % MT SOLN
OROMUCOSAL | Status: AC
  Filled 2020-11-17: qty 15

## 2020-11-17 MED ORDER — METHOCARBAMOL 500 MG PO TABS: 500.0000 mg | ORAL_TABLET | Freq: Three times a day (TID) | ORAL | 0 refills | Status: DC | PRN

## 2020-11-17 MED ORDER — SUGAMMADEX SODIUM 200 MG/2ML IV SOLN
INTRAVENOUS | Status: DC | PRN
  Administered 2020-11-17: 300 mg via INTRAVENOUS

## 2020-11-17 MED ORDER — CEFAZOLIN SODIUM-DEXTROSE 2-4 GM/100ML-% IV SOLN
2.0000 g | Freq: Four times a day (QID) | INTRAVENOUS | Status: DC
  Filled 2020-11-17 (×2): qty 100

## 2020-11-17 MED ORDER — HYDROMORPHONE HCL 1 MG/ML IJ SOLN: 0.2500 mg | INTRAMUSCULAR | Status: DC | PRN

## 2020-11-17 MED ORDER — BISACODYL 10 MG RE SUPP: 10.0000 mg | Freq: Every day | RECTAL | Status: DC | PRN

## 2020-11-17 MED ORDER — METOCLOPRAMIDE HCL 5 MG/ML IJ SOLN
5.0000 mg | Freq: Three times a day (TID) | INTRAMUSCULAR | Status: DC | PRN
Start: 2020-11-17 — End: 2020-11-20

## 2020-11-17 MED ORDER — FENTANYL CITRATE (PF) 250 MCG/5ML IJ SOLN
INTRAMUSCULAR | Status: DC | PRN
  Administered 2020-11-17: 100 ug via INTRAVENOUS

## 2020-11-17 MED ORDER — ONDANSETRON HCL 4 MG/2ML IJ SOLN
INTRAMUSCULAR | Status: DC | PRN
  Administered 2020-11-17: 4 mg via INTRAVENOUS

## 2020-11-17 MED ORDER — PHENYLEPHRINE HCL-NACL 10-0.9 MG/250ML-% IV SOLN
INTRAVENOUS | Status: DC | PRN
  Administered 2020-11-17: 45 ug/min via INTRAVENOUS

## 2020-11-17 MED ORDER — ONDANSETRON HCL 4 MG/2ML IJ SOLN: 4.0000 mg | Freq: Once | INTRAMUSCULAR | Status: DC | PRN

## 2020-11-17 MED ORDER — OXYCODONE HCL 5 MG PO TABS: 5.0000 mg | ORAL_TABLET | Freq: Four times a day (QID) | ORAL | 0 refills | Status: DC | PRN

## 2020-11-17 MED ORDER — MIDAZOLAM HCL 2 MG/2ML IJ SOLN
INTRAMUSCULAR | Status: AC
  Filled 2020-11-17: qty 2

## 2020-11-17 MED ORDER — CEFAZOLIN SODIUM-DEXTROSE 2-4 GM/100ML-% IV SOLN
2.0000 g | Freq: Four times a day (QID) | INTRAVENOUS | Status: AC
  Administered 2020-11-17 (×2): 2 g via INTRAVENOUS
  Filled 2020-11-17 (×2): qty 100

## 2020-11-17 MED ORDER — OXYCODONE HCL 5 MG PO TABS
5.0000 mg | ORAL_TABLET | ORAL | Status: DC | PRN
  Administered 2020-11-17 – 2020-11-18 (×2): 5 mg via ORAL
  Filled 2020-11-17 (×2): qty 1

## 2020-11-17 MED ORDER — OXYCODONE HCL 5 MG PO TABS: 5.0000 mg | ORAL_TABLET | ORAL | Status: DC | PRN

## 2020-11-17 MED ORDER — VANCOMYCIN HCL 1000 MG IV SOLR
INTRAVENOUS | Status: AC
  Filled 2020-11-17: qty 1000

## 2020-11-17 MED ORDER — CHLORHEXIDINE GLUCONATE 0.12 % MT SOLN: 15.0000 mL | Freq: Once | OROMUCOSAL | Status: DC

## 2020-11-17 MED ORDER — MEPERIDINE HCL 25 MG/ML IJ SOLN: 6.2500 mg | INTRAMUSCULAR | Status: DC | PRN

## 2020-11-17 MED ORDER — METOCLOPRAMIDE HCL 5 MG PO TABS: 5.0000 mg | ORAL_TABLET | Freq: Three times a day (TID) | ORAL | Status: DC | PRN

## 2020-11-17 MED ORDER — PROPOFOL 10 MG/ML IV BOLUS
INTRAVENOUS | Status: DC | PRN
  Administered 2020-11-17: 120 mg via INTRAVENOUS

## 2020-11-17 MED ORDER — SENNOSIDES-DOCUSATE SODIUM 8.6-50 MG PO TABS: 1.0000 | ORAL_TABLET | Freq: Every evening | ORAL | Status: DC | PRN

## 2020-11-17 MED ORDER — DIPHENHYDRAMINE HCL 50 MG PO TABS: 25.0000 mg | ORAL_TABLET | Freq: Every evening | ORAL | 0 refills | Status: DC | PRN

## 2020-11-17 MED ORDER — MORPHINE SULFATE (PF) 2 MG/ML IV SOLN
2.0000 mg | INTRAVENOUS | Status: DC | PRN
  Administered 2020-11-17 – 2020-11-28 (×6): 2 mg via INTRAVENOUS
  Filled 2020-11-17 (×6): qty 1

## 2020-11-17 MED ORDER — ROCURONIUM BROMIDE 10 MG/ML (PF) SYRINGE
PREFILLED_SYRINGE | INTRAVENOUS | Status: DC | PRN
  Administered 2020-11-17: 100 mg via INTRAVENOUS

## 2020-11-17 MED ORDER — FENTANYL CITRATE (PF) 250 MCG/5ML IJ SOLN
INTRAMUSCULAR | Status: AC
  Filled 2020-11-17: qty 5

## 2020-11-17 MED ORDER — 0.9 % SODIUM CHLORIDE (POUR BTL) OPTIME
TOPICAL | Status: DC | PRN
  Administered 2020-11-17: 10:00:00 1000 mL

## 2020-11-17 MED ORDER — ONDANSETRON HCL 4 MG/2ML IJ SOLN: 4.0000 mg | Freq: Four times a day (QID) | INTRAMUSCULAR | 0 refills | Status: DC | PRN

## 2020-11-17 MED ORDER — PHENYLEPHRINE 40 MCG/ML (10ML) SYRINGE FOR IV PUSH (FOR BLOOD PRESSURE SUPPORT)
PREFILLED_SYRINGE | INTRAVENOUS | Status: DC | PRN
  Administered 2020-11-17: 80 ug via INTRAVENOUS

## 2020-11-17 MED ORDER — ACETAMINOPHEN 500 MG PO TABS: 1000.0000 mg | ORAL_TABLET | Freq: Four times a day (QID) | ORAL | 0 refills | Status: DC | PRN

## 2020-11-17 MED ORDER — HYDROMORPHONE HCL 1 MG/ML IJ SOLN
1.0000 mg | Freq: Once | INTRAMUSCULAR | Status: AC
Start: 2020-11-17 — End: 2020-11-17
  Administered 2020-11-17: 13:00:00 1 mg via INTRAVENOUS
  Filled 2020-11-17: qty 1

## 2020-11-17 MED ORDER — DEXAMETHASONE SODIUM PHOSPHATE 10 MG/ML IJ SOLN
INTRAMUSCULAR | Status: DC | PRN
  Administered 2020-11-17: 5 mg via INTRAVENOUS

## 2020-11-17 MED ORDER — LIDOCAINE 2% (20 MG/ML) 5 ML SYRINGE
INTRAMUSCULAR | Status: DC | PRN
  Administered 2020-11-17: 100 mg via INTRAVENOUS

## 2020-11-17 SURGICAL SUPPLY — 41 items
ADH SKN CLS APL DERMABOND .7 (GAUZE/BANDAGES/DRESSINGS)
APL PRP STRL LF DISP 70% ISPRP (MISCELLANEOUS) ×1
BIT DRILL AO GAMMA 4.2X180 (BIT) ×1 IMPLANT
CHLORAPREP W/TINT 26 (MISCELLANEOUS) ×2 IMPLANT
COVER PERINEAL POST (MISCELLANEOUS) ×2 IMPLANT
COVER SURGICAL LIGHT HANDLE (MISCELLANEOUS) ×2 IMPLANT
DERMABOND ADVANCED (GAUZE/BANDAGES/DRESSINGS)
DERMABOND ADVANCED .7 DNX12 (GAUZE/BANDAGES/DRESSINGS) ×1 IMPLANT
DRAPE IMP U-DRAPE 54X76 (DRAPES) ×4 IMPLANT
DRAPE INCISE IOBAN 66X45 STRL (DRAPES) ×2 IMPLANT
DRAPE STERI IOBAN 125X83 (DRAPES) ×2 IMPLANT
DRSG MEPILEX BORDER 4X4 (GAUZE/BANDAGES/DRESSINGS) ×2 IMPLANT
ELECT REM PT RETURN 9FT ADLT (ELECTROSURGICAL) ×2
ELECTRODE REM PT RTRN 9FT ADLT (ELECTROSURGICAL) ×1 IMPLANT
GLOVE BIOGEL PI IND STRL 7.5 (GLOVE) ×1 IMPLANT
GLOVE BIOGEL PI INDICATOR 7.5 (GLOVE) ×1
GOWN STRL REUS W/ TWL LRG LVL3 (GOWN DISPOSABLE) ×1 IMPLANT
GOWN STRL REUS W/ TWL XL LVL3 (GOWN DISPOSABLE) IMPLANT
GOWN STRL REUS W/TWL LRG LVL3 (GOWN DISPOSABLE) ×4
GOWN STRL REUS W/TWL XL LVL3 (GOWN DISPOSABLE) ×2
GUIDEROD T2 3X1000 (ROD) ×1 IMPLANT
K-WIRE  3.2X450M STR (WIRE) ×2
K-WIRE 3.2X450M STR (WIRE) ×1
KIT BASIN OR (CUSTOM PROCEDURE TRAY) ×2 IMPLANT
KIT NAIL LONG 10X360MMX125 (Nail) ×1 IMPLANT
KIT TURNOVER KIT B (KITS) ×2 IMPLANT
KWIRE 3.2X450M STR (WIRE) IMPLANT
MANIFOLD NEPTUNE II (INSTRUMENTS) ×2 IMPLANT
NS IRRIG 1000ML POUR BTL (IV SOLUTION) ×2 IMPLANT
PACK GENERAL/GYN (CUSTOM PROCEDURE TRAY) ×2 IMPLANT
PAD ARMBOARD 7.5X6 YLW CONV (MISCELLANEOUS) ×4 IMPLANT
REAMER SHAFT BIXCUT (INSTRUMENTS) ×1 IMPLANT
SCREW LAG GAMMA 3 95MM (Screw) ×1 IMPLANT
STRIP CLOSURE SKIN 1/2X4 (GAUZE/BANDAGES/DRESSINGS) ×1 IMPLANT
SUT MNCRL AB 3-0 PS2 18 (SUTURE) ×2 IMPLANT
SUT VIC AB 0 CT1 27 (SUTURE) ×2
SUT VIC AB 0 CT1 27XBRD ANBCTR (SUTURE) IMPLANT
SUT VIC AB 2-0 CT1 27 (SUTURE) ×2
SUT VIC AB 2-0 CT1 TAPERPNT 27 (SUTURE) ×2 IMPLANT
TOWEL GREEN STERILE (TOWEL DISPOSABLE) ×3 IMPLANT
WATER STERILE IRR 1000ML POUR (IV SOLUTION) ×2 IMPLANT

## 2020-11-17 NOTE — Anesthesia Procedure Notes (Signed)
Procedure Name: Intubation Date/Time: 11/17/2020 9:04 AM Performed by: Darletta Moll, CRNA Pre-anesthesia Checklist: Patient identified, Emergency Drugs available, Suction available and Patient being monitored Patient Re-evaluated:Patient Re-evaluated prior to induction Oxygen Delivery Method: Circle system utilized Preoxygenation: Pre-oxygenation with 100% oxygen Induction Type: IV induction Ventilation: Mask ventilation without difficulty and Oral airway inserted - appropriate to patient size Laryngoscope Size: Mac and 3 Grade View: Grade I Tube type: Oral Tube size: 7.0 mm Number of attempts: 1 Airway Equipment and Method: Stylet and Oral airway Placement Confirmation: ETT inserted through vocal cords under direct vision,  positive ETCO2 and breath sounds checked- equal and bilateral Secured at: 21 cm Tube secured with: Tape Dental Injury: Teeth and Oropharynx as per pre-operative assessment

## 2020-11-17 NOTE — Transfer of Care (Signed)
Immediate Anesthesia Transfer of Care Note  Patient: Margaret [name]  Procedure(s) Performed: INTRAMEDULLARY (IM) NAIL INTERTROCHANTRIC (Left )  Patient Location: PACU  Anesthesia Type:General  Level of Consciousness: drowsy and patient cooperative  Airway & Oxygen Therapy: Patient Spontanous Breathing and Patient connected to nasal cannula oxygen  Post-op Assessment: Report given to RN and Post -op Vital signs reviewed and stable  Post vital signs: Reviewed and stable  Last Vitals:  Vitals Value Taken Time  BP 145/76 11/17/20 1026  Temp    Pulse 72 11/17/20 1031  Resp 19 11/17/20 1031  SpO2 97 % 11/17/20 1031  Vitals shown include unvalidated device data.  Last Pain:  Vitals:   11/17/20 0531  TempSrc: Oral  PainSc:       Patients Stated Pain Goal: 2 (60/63/01 6010)  Complications: No complications documented.

## 2020-11-17 NOTE — Interval H&P Note (Signed)
History and Physical Interval Note:  11/17/2020 8:42 AM  Margaret [name]  has presented today for surgery, with the diagnosis of left hip fracture.  The various methods of treatment have been discussed with the patient and family. After consideration of risks, benefits and other options for treatment, the patient has consented to  Procedure(s): INTRAMEDULLARY (IM) NAIL INTERTROCHANTRIC (Left) as a surgical intervention.  [name] history has been reviewed, patient examined, no change in status, stable for surgery.  I have reviewed the patient's chart and labs.  Questions were answered to the patient's satisfaction.     Renette Butters

## 2020-11-17 NOTE — Op Note (Signed)
DATE OF SURGERY:  11/17/2020  TIME: 9:48 AM  PATIENT NAME:  Margaret [name]  AGE: 64 y.o.  PRE-OPERATIVE DIAGNOSIS:  left hip fracture  POST-OPERATIVE DIAGNOSIS:  SAME  PROCEDURE:  INTRAMEDULLARY (IM) NAIL INTERTROCHANTRIC  SURGEON:  Renette Butters  ASSISTANT:  Margy Clarks, PA-C, he was present and scrubbed throughout the case, critical for completion in a timely fashion, and for retraction, instrumentation, and closure.   OPERATIVE IMPLANTS: Stryker Gamma Nail  PREOPERATIVE INDICATIONS:  Margaret [name] is a 64 y.o. year old who fell and suffered a hip fracture. She was brought into the ER and then admitted and optimized and then elected for surgical intervention.    The risks benefits and alternatives were discussed with the patient including but not limited to the risks of nonoperative treatment, versus surgical intervention including infection, bleeding, nerve injury, malunion, nonunion, hardware prominence, hardware failure, need for hardware removal, blood clots, cardiopulmonary complications, morbidity, mortality, among others, and they were willing to proceed.    OPERATIVE PROCEDURE:  The patient was brought to the operating room and placed in the supine position. General anesthesia was administered. She was placed on the fracture table.  Closed reduction was performed under C-arm guidance. Time out was then performed after sterile prep and drape. She received preoperative antibiotics.  Incision was made proximal to the greater trochanter. A guidewire was placed in the appropriate position. Confirmation was made on AP and lateral views. The above-named nail was opened. I opened the proximal femur with a reamer. I then placed the nail by hand easily down. I did not need to ream the femur.  Once the nail was completely seated, I placed a guidepin into the femoral head into the center center position. I measured the length, and then reamed the lateral cortex and up  into the head. I then placed the lag screw. Slight compression was applied. Anatomic fixation achieved. Bone quality was mediocre.  I then secured the proximal interlocking bolt, and took off a half a turn, and then removed the instruments, and took final C-arm pictures AP and lateral the entire length of the leg.    Anatomic reconstruction was achieved, and the wounds were irrigated copiously and closed with Vicryl followed by staples and sterile gauze for the skin. The patient was awakened and returned to PACU in stable and satisfactory condition. There no complications and the patient tolerated the procedure well.  She will be weightbearing as tolerated, and will be on chemical px  for a period of four weeks after discharge.   Margaret [name], M.D.

## 2020-11-17 NOTE — Progress Notes (Signed)
PROGRESS NOTE    Margaret [name]  YQM:[id number] DOB: 1956-03-17 DOA: 11/14/2020 PCP: Patient, No Pcp Per     Brief Narrative:  64 y.o. WF PMHx Panic attacks, chronic Diastolic CHF, s/p Mitral Valve repair with Annoloplasty on Eliquis, Endocarditis, COPD, Pulmonary HTN, Obesity ,Subarachnoid bleed, CVA, Subarachnoid bleed DM type II controlled with hyperglycemia, Splenic infarction,   Presents to the emergency department after sustaining fall at home.  Patient has left sided weakness at baseline due to prior stroke and ambulates with a cane.  She states that she slipped and fell while at home, she does not think she hit her head.  Left hip pain was rated as 10/10 on pain scale without any aggravating or alleviating factors she denies headache, dizziness, chest pain, shortness of breath or abdominal pain.  ED Course:  In the emergency department, she was hemodynamically stable.  Work-up in the ED showed leukocytosis, thrombocytosis. CT head without contrast showed no evidence of acute intracranial abnormality CT cervical spine showed no evidence of acute fracture or traumatic malalignment but showed a moderate degenerative disc disease at C6-C7. Left hip x-ray showed intertrochanteric femur fracture on the left with varus angulation at the fracture site.  IV opioid medications were given.  Orthopedic surgery was consulted by ED team.  Hospitalist was asked to admit patient for further evaluation and management   Subjective: 12/26 I tend to see patient twice in the operating room no charge     Assessment & Plan: Covid vaccination; vaccinated   Principal Problem:   Closed left femoral fracture (HCC) Active Problems:   COPD (chronic obstructive pulmonary disease) (HCC)   Chronic diastolic CHF (congestive heart failure) (HCC)   Morbid obesity (HCC)   Essential hypertension   HLD (hyperlipidemia)   History of CVA with residual deficit   Hyperglycemia due to diabetes mellitus  (HCC)   Fall at home, initial encounter  LEFT femoral intertrochanteric fracture secondary to mechanical fall -Scheduled for operative repair Sunday, secondary to patient being on Eliquis.Marland Kitchen -LEFT hip x-ray; showed intertrochanteric femur fracture on the left with varus angulation at the fracture site.  -Pain medication per orthopedic surgery.  -Fall precautions -Neuro checks  Chronic diastolic CHF -Bisoprolol 5 mg daily -Bumex 1 mg daily -Strict I&O - -Daily weight Filed Weights   11/14/20 1443 11/17/20 0500  Weight: 127.5 kg 127.6 kg    Essential HTN -See CHF  Mitral valve repair -On Eliquis (hold ) secondary to upcoming surgery  COPD -Per patient not on home O2 or CPAP -Albuterol PRN  Hx of CVA -Hx residual left-sided weakness  DM type II uncontrolled with hyperglycemia -12/23 hemoglobin A1c= 8.5 -Moderate SSI  Leukocytosis -Most likely reactive given patient has negative fever, negative bands, negative left shift  Thrombocytosis -Resolved  HLD -Lipid panel pending -Crestor 10 mg daily  Morbid obesity -Post surgery will benefit from nutritional consult for lifestyle modification    DVT prophylaxis: Eliquis (on hold) for Sunday surgery Code Status: Full Family Communication:  Status is: Inpatient    Dispo: The patient is from: Home              Anticipated d/c is to: SNF              Anticipated d/c date is: 12/30              Patient currently unstable      Consultants:  Orthopedic surgery  Procedures/Significant Events:    I have personally reviewed and interpreted all  radiology studies and my findings are as above.  VENTILATOR SETTINGS:    Cultures   Antimicrobials:    Devices    LINES / TUBES:      Continuous Infusions: .  ceFAZolin (ANCEF) IV    . methocarbamol (ROBAXIN) IV 500 mg (11/17/20 1207)     Objective: Vitals:   11/17/20 1122 11/17/20 1130 11/17/20 1220 11/17/20 1320  BP:   127/65 114/72   Pulse:   74 74  Resp:   18 17  Temp:   97.6 F (36.4 C) (!) 97.4 F (36.3 C)  TempSrc:   Oral Oral  SpO2: (!) 88% 91% 95% 91%  Weight:      Height:        Intake/Output Summary (Last 24 hours) at 11/17/2020 1353 Last data filed at 11/17/2020 1020 Gross per 24 hour  Intake 750 ml  Output 1850 ml  Net -1100 ml   Filed Weights   11/14/20 1443 11/17/20 0500  Weight: 127.5 kg 127.6 kg    Examination:  Attempted to see patient twice in operating room no charge .     Data Reviewed: Care during the described time interval was provided by me .  I have reviewed this patient's available data, including medical history, events of note, physical examination, and all test results as part of my evaluation.  CBC: Recent Labs  Lab 11/14/20 1444 11/15/20 0304 11/16/20 0138 11/17/20 0056  WBC 14.4* 15.2* 13.5* 14.4*  NEUTROABS 11.8*  --  10.6* 11.3*  HGB 14.8 14.6 14.2 13.4  HCT 48.3* 46.5* 45.5 43.8  MCV 88.8 89.1 89.0 88.0  PLT 417* 412* 372 352   Basic Metabolic Panel: Recent Labs  Lab 11/14/20 1444 11/15/20 0304 11/16/20 0138 11/17/20 0056  NA 135 137 132* 128*  K 4.6 4.3 4.2 3.9  CL 100 96* 94* 92*  CO2 24 33* 30 27  GLUCOSE 193* 157* 151* 328*  BUN 14 15 18 21   CREATININE 1.19* 1.24* 1.34* 1.32*  CALCIUM 9.4 9.8 9.6 9.3  MG  --  1.8 1.8 1.6*  PHOS  --  4.1 3.5 2.1*   GFR: Estimated Creatinine Clearance: 57 mL/min (A) (by C-G formula based on SCr of 1.32 mg/dL (H)). Liver Function Tests: Recent Labs  Lab 11/15/20 0304 11/16/20 0138 11/17/20 0056  AST 23 14* 13*  ALT 22 16 13   ALKPHOS 91 83 74  BILITOT 1.0 1.3* 1.1  PROT 7.1 7.0 6.3*  ALBUMIN 3.0* 2.8* 2.4*   No results for input(s): LIPASE, AMYLASE in the last 168 hours. No results for input(s): AMMONIA in the last 168 hours. Coagulation Profile: Recent Labs  Lab 11/14/20 1703 11/15/20 0304  INR 1.2 1.1   Cardiac Enzymes: No results for input(s): CKTOTAL, CKMB, CKMBINDEX, TROPONINI in the  last 168 hours. BNP (last 3 results) No results for input(s): PROBNP in the last 8760 hours. HbA1C: Recent Labs    11/14/20 2139  HGBA1C 8.5*   CBG: Recent Labs  Lab 11/16/20 2017 11/17/20 0545 11/17/20 0738 11/17/20 1032 11/17/20 1136  GLUCAP 180* 195* 159* 197* 193*   Lipid Profile: Recent Labs    11/16/20 0138  CHOL 104  HDL 34*  LDLCALC 54  TRIG 81  CHOLHDL 3.1   Thyroid Function Tests: No results for input(s): TSH, T4TOTAL, FREET4, T3FREE, THYROIDAB in the last 72 hours. Anemia Panel: No results for input(s): VITAMINB12, FOLATE, FERRITIN, TIBC, IRON, RETICCTPCT in the last 72 hours. Sepsis Labs: No results for input(s): PROCALCITON, LATICACIDVEN  in the last 168 hours.  Recent Results (from the past 240 hour(s))  Resp Panel by RT-PCR (Flu A&B, Covid) Nasopharyngeal Swab     Status: None   Collection Time: 11/14/20  2:44 PM   Specimen: Nasopharyngeal Swab; Nasopharyngeal(NP) swabs in vial transport medium  Result Value Ref Range Status   SARS Coronavirus 2 by RT PCR NEGATIVE NEGATIVE Final    Comment: (NOTE) SARS-CoV-2 target nucleic acids are NOT DETECTED.  The SARS-CoV-2 RNA is generally detectable in upper respiratory specimens during the acute phase of infection. The lowest concentration of SARS-CoV-2 viral copies this assay can detect is 138 copies/mL. A negative result does not preclude SARS-Cov-2 infection and should not be used as the sole basis for treatment or other patient management decisions. A negative result may occur with  improper specimen collection/handling, submission of specimen other than nasopharyngeal swab, presence of viral mutation(s) within the areas targeted by this assay, and inadequate number of viral copies(<138 copies/mL). A negative result must be combined with clinical observations, patient history, and epidemiological information. The expected result is Negative.  Fact Sheet for Patients:   BloggerCourse.com  Fact Sheet for Healthcare Providers:  SeriousBroker.it  This test is no t yet approved or cleared by the [place] FDA and  has been authorized for detection and/or diagnosis of SARS-CoV-2 by FDA under an Emergency Use Authorization (EUA). This EUA will remain  in effect (meaning this test can be used) for the duration of the COVID-19 declaration under Section 564(b)(1) of the Act, 21 U.S.C.section 360bbb-3(b)(1), unless the authorization is terminated  or revoked sooner.       Influenza A by PCR NEGATIVE NEGATIVE Final   Influenza B by PCR NEGATIVE NEGATIVE Final    Comment: (NOTE) The Xpert Xpress SARS-CoV-2/FLU/RSV plus assay is intended as an aid in the diagnosis of influenza from Nasopharyngeal swab specimens and should not be used as a sole basis for treatment. Nasal washings and aspirates are unacceptable for Xpert Xpress SARS-CoV-2/FLU/RSV testing.  Fact Sheet for Patients: BloggerCourse.com  Fact Sheet for Healthcare Providers: SeriousBroker.it  This test is not yet approved or cleared by the [place] FDA and has been authorized for detection and/or diagnosis of SARS-CoV-2 by FDA under an Emergency Use Authorization (EUA). This EUA will remain in effect (meaning this test can be used) for the duration of the COVID-19 declaration under Section 564(b)(1) of the Act, 21 U.S.C. section 360bbb-3(b)(1), unless the authorization is terminated or revoked.  Performed at [facility], [address] [place], [place] [address]   Surgical PCR screen     Status: Abnormal   Collection Time: 11/15/20  5:37 AM   Specimen: Nasal Mucosa; Nasal Swab  Result Value Ref Range Status   MRSA, PCR POSITIVE (A) NEGATIVE Final    Comment: RESULT CALLED TO, READ BACK BY AND VERIFIED WITH: Redmond Baseman RN 9:45 11/15/20 ([email or web address])    Staphylococcus aureus  POSITIVE (A) NEGATIVE Final    Comment: (NOTE) The Xpert SA Assay (FDA approved for NASAL specimens in patients 74 years of age and older), is one component of a comprehensive surveillance program. It is not intended to diagnose infection nor to guide or monitor treatment. Performed at [facility], [address] [place], [place] [address]          Radiology Studies: DG C-Arm 1-60 Min  Result Date: 11/17/2020 CLINICAL DATA:  Left intratrochanteric femoral fracture EXAM: DG C-ARM 1-60 MIN; LEFT FEMUR 2 VIEWS COMPARISON:  None. FLUOROSCOPY TIME:  Fluoroscopy Time:  45 seconds Radiation Exposure Index (if provided by the fluoroscopic device): 18.12 mGy Number of Acquired Spot Images: 5 FINDINGS: Left femoral rod is noted with proximal fixation screw. Fracture fragments are in near anatomic alignment. IMPRESSION: ORIF of proximal left femoral fracture Electronically Signed   By: Alcide Clever M.D.   On: 11/17/2020 10:22   DG FEMUR MIN 2 VIEWS LEFT  Result Date: 11/17/2020 CLINICAL DATA:  Left intratrochanteric femoral fracture EXAM: DG C-ARM 1-60 MIN; LEFT FEMUR 2 VIEWS COMPARISON:  None. FLUOROSCOPY TIME:  Fluoroscopy Time:  45 seconds Radiation Exposure Index (if provided by the fluoroscopic device): 18.12 mGy Number of Acquired Spot Images: 5 FINDINGS: Left femoral rod is noted with proximal fixation screw. Fracture fragments are in near anatomic alignment. IMPRESSION: ORIF of proximal left femoral fracture Electronically Signed   By: Alcide Clever M.D.   On: 11/17/2020 10:22        Scheduled Meds: . [START ON 11/18/2020] apixaban  2.5 mg Oral BID  . bisoprolol  5 mg Oral Daily  . bumetanide  1 mg Oral Daily  . chlorhexidine      . docusate sodium  100 mg Oral BID  . insulin aspart  0-15 Units Subcutaneous TID WC  . insulin aspart  0-5 Units Subcutaneous QHS  . mupirocin ointment  1 application Nasal BID  . pantoprazole  20 mg Oral Daily  . rosuvastatin  10 mg Oral Daily    Continuous Infusions: .  ceFAZolin (ANCEF) IV    . methocarbamol (ROBAXIN) IV 500 mg (11/17/20 1207)     LOS: 3 days    Time spent:0 min    Joanann Mies, Roselind Messier, MD [facility] Pager [phone]  If 7PM-7AM, please contact night-coverage [email or web address] Password [email or web address] 11/17/2020, 1:53 PM

## 2020-11-17 NOTE — Progress Notes (Signed)
OT Cancellation Note  Patient Details Name: Margaret [name] MRN: [id number] DOB: November 13, 1956   Cancelled Treatment:    Reason Eval/Treat Not Completed: Patient at procedure or test/ unavailable.  Pt in surgery.  Will follow up.    Nilsa Nutting., OTR/L Acute Rehabilitation Services Pager [phone] Office [phone]   Lucille Passy M 11/17/2020, 8:10 AM

## 2020-11-17 NOTE — Anesthesia Preprocedure Evaluation (Signed)
Anesthesia Evaluation  Patient identified by MRN, date of birth, ID band Patient awake    Reviewed: Allergy & Precautions, NPO status , Patient's Chart, lab work & pertinent test results  Airway Mallampati: I  TM Distance: >3 FB Neck ROM: Full    Dental   Pulmonary COPD, former smoker,    Pulmonary exam normal        Cardiovascular hypertension, Pt. on medications + CAD and + CABG  Normal cardiovascular exam     Neuro/Psych Anxiety CVA    GI/Hepatic   Endo/Other  diabetes, Type 2, Insulin Dependent  Renal/GU      Musculoskeletal   Abdominal   Peds  Hematology   Anesthesia Other Findings   Reproductive/Obstetrics                             Anesthesia Physical Anesthesia Plan  ASA: III  Anesthesia Plan: General   Post-op Pain Management:    Induction: Intravenous  PONV Risk Score and Plan: 3 and Midazolam, Ondansetron and Treatment may vary due to age or medical condition  Airway Management Planned: Oral ETT  Additional Equipment:   Intra-op Plan:   Post-operative Plan: Extubation in OR  Informed Consent: I have reviewed the patients History and Physical, chart, labs and discussed the procedure including the risks, benefits and alternatives for the proposed anesthesia with the patient or authorized representative who has indicated his/her understanding and acceptance.       Plan Discussed with: Surgeon and CRNA  Anesthesia Plan Comments:         Anesthesia Quick Evaluation

## 2020-11-17 NOTE — Plan of Care (Signed)

## 2020-11-17 NOTE — Anesthesia Postprocedure Evaluation (Signed)
Anesthesia Post Note  Patient: Margaret [name]  Procedure(s) Performed: INTRAMEDULLARY (IM) NAIL INTERTROCHANTRIC (Left )     Patient location during evaluation: PACU Anesthesia Type: General Level of consciousness: awake and alert Pain management: pain level controlled Vital Signs Assessment: post-procedure vital signs reviewed and stable Respiratory status: spontaneous breathing, nonlabored ventilation, respiratory function stable and patient connected to nasal cannula oxygen Cardiovascular status: blood pressure returned to baseline and stable Postop Assessment: no apparent nausea or vomiting Anesthetic complications: no   No complications documented.  Last Vitals:  Vitals:   11/17/20 1120 11/17/20 1122  BP: 119/66   Pulse: 76   Resp: 16   Temp: 36.5 C   SpO2: (!) 82% (!) 88%    Last Pain:  Vitals:   11/17/20 1114  TempSrc:   PainSc: 10-Worst pain ever                 Ottis Vacha DAVID

## 2020-11-18 DIAGNOSIS — W19XXXA Unspecified fall, initial encounter: Secondary | ICD-10-CM | POA: Diagnosis not present

## 2020-11-18 DIAGNOSIS — N1831 Chronic kidney disease, stage 3a: Secondary | ICD-10-CM

## 2020-11-18 DIAGNOSIS — J431 Panlobular emphysema: Secondary | ICD-10-CM | POA: Diagnosis not present

## 2020-11-18 DIAGNOSIS — I5032 Chronic diastolic (congestive) heart failure: Secondary | ICD-10-CM | POA: Diagnosis not present

## 2020-11-18 DIAGNOSIS — N179 Acute kidney failure, unspecified: Secondary | ICD-10-CM | POA: Diagnosis present

## 2020-11-18 DIAGNOSIS — S72142A Displaced intertrochanteric fracture of left femur, initial encounter for closed fracture: Secondary | ICD-10-CM | POA: Diagnosis not present

## 2020-11-18 LAB — CBC WITH DIFFERENTIAL/PLATELET
Abs Immature Granulocytes: 0.06 10*3/uL (ref 0.00–0.07)
Basophils Absolute: 0 10*3/uL (ref 0.0–0.1)
Basophils Relative: 0 %
Eosinophils Absolute: 0 10*3/uL (ref 0.0–0.5)
Eosinophils Relative: 0 %
HCT: 39.6 % (ref 36.0–46.0)
Hemoglobin: 12.9 g/dL (ref 12.0–15.0)
Immature Granulocytes: 0 %
Lymphocytes Relative: 8 %
Lymphs Abs: 1.1 10*3/uL (ref 0.7–4.0)
MCH: 28.2 pg (ref 26.0–34.0)
MCHC: 32.6 g/dL (ref 30.0–36.0)
MCV: 86.5 fL (ref 80.0–100.0)
Monocytes Absolute: 1.3 10*3/uL — ABNORMAL HIGH (ref 0.1–1.0)
Monocytes Relative: 9 %
Neutro Abs: 12.3 10*3/uL — ABNORMAL HIGH (ref 1.7–7.7)
Neutrophils Relative %: 83 %
Platelets: 332 10*3/uL (ref 150–400)
RBC: 4.58 MIL/uL (ref 3.87–5.11)
RDW: 15.7 % — ABNORMAL HIGH (ref 11.5–15.5)
WBC: 14.8 10*3/uL — ABNORMAL HIGH (ref 4.0–10.5)
nRBC: 0 % (ref 0.0–0.2)

## 2020-11-18 LAB — MAGNESIUM: Magnesium: 1.9 mg/dL (ref 1.7–2.4)

## 2020-11-18 LAB — COMPREHENSIVE METABOLIC PANEL
ALT: 12 U/L (ref 0–44)
AST: 12 U/L — ABNORMAL LOW (ref 15–41)
Albumin: 2.2 g/dL — ABNORMAL LOW (ref 3.5–5.0)
Alkaline Phosphatase: 80 U/L (ref 38–126)
Anion gap: 10 (ref 5–15)
BUN: 20 mg/dL (ref 8–23)
CO2: 27 mmol/L (ref 22–32)
Calcium: 9.9 mg/dL (ref 8.9–10.3)
Chloride: 96 mmol/L — ABNORMAL LOW (ref 98–111)
Creatinine, Ser: 1.16 mg/dL — ABNORMAL HIGH (ref 0.44–1.00)
GFR, Estimated: 53 mL/min — ABNORMAL LOW (ref >60)
Glucose, Bld: 253 mg/dL — ABNORMAL HIGH (ref 70–99)
Potassium: 4.6 mmol/L (ref 3.5–5.1)
Sodium: 133 mmol/L — ABNORMAL LOW (ref 135–145)
Total Bilirubin: 0.7 mg/dL (ref 0.3–1.2)
Total Protein: 6.6 g/dL (ref 6.5–8.1)

## 2020-11-18 LAB — PHOSPHORUS: Phosphorus: 2.2 mg/dL — ABNORMAL LOW (ref 2.5–4.6)

## 2020-11-18 LAB — GLUCOSE, CAPILLARY
Glucose-Capillary: 206 mg/dL — ABNORMAL HIGH (ref 70–99)
Glucose-Capillary: 186 mg/dL — ABNORMAL HIGH (ref 70–99)
Glucose-Capillary: 187 mg/dL — ABNORMAL HIGH (ref 70–99)
Glucose-Capillary: 327 mg/dL — ABNORMAL HIGH (ref 70–99)

## 2020-11-18 MED ORDER — OXYCODONE HCL 5 MG PO TABS
5.0000 mg | ORAL_TABLET | ORAL | Status: DC | PRN
  Administered 2020-11-18 – 2020-11-28 (×30): 5 mg via ORAL
  Filled 2020-11-18 (×32): qty 1

## 2020-11-18 MED ORDER — INSULIN GLARGINE 100 UNIT/ML ~~LOC~~ SOLN
8.0000 [IU] | Freq: Every day | SUBCUTANEOUS | Status: DC
  Administered 2020-11-18 – 2020-11-19 (×2): 8 [IU] via SUBCUTANEOUS
  Filled 2020-11-18 (×3): qty 0.08

## 2020-11-18 NOTE — Evaluation (Addendum)
Occupational Therapy Evaluation Patient Details Name: Margaret [name] MRN: [id number] DOB: 01/01/1956 Today's Date: 11/18/2020    History of Present Illness Margaret [name] is a 64 y.o. female with past medical history significant for endocarditis, CHF, COPD, obesity, pulmonary hypertension, valve repair, subarachnoid bleed, CVA, diabetes who presents for evaluation after fall. She had a left sided weakness after prior stroke. She just discontinued using a wheelchair in August of this year. She normally ambulates with a cane. She fell yesterday and had immediate pain in her left hip with inability to bear weight. Orthopedics was consulted for left femur fracture and underwent IM nailing on 12/26   Clinical Impression   PTA, pt lives with spouse and reports assistance from PCA M-F for 2 hours. Pt receives assistance for bathing tasks, but reports typically able to perform dressing, toileting and mobility using cane without assistance. Pt presents now with deficits noted below, baseline L sided hemiparesis and greatly limited by pain/anxiety despite premedication. Ultimately, unable to advance OOB due to pt crying out in pain with minimal movement/touch to operative LE. Pt will require maximove for transfers OOB, Max A for UB ADLs and Total A for LB ADLs at bed level at this time. Plan to progress to EOB as appropriate to improve participation with daily tasks. Recommend SNF for short term rehab prior to DC home.     Follow Up Recommendations  SNF;Supervision/Assistance - 24 hour    Equipment Recommendations  Tub/shower bench (to be continually assessed)    Recommendations for Other Services       Precautions / Restrictions Precautions Precautions: Fall;Other (comment) Precaution Comments: L sided hemiparesis from previous CVA Restrictions Weight Bearing Restrictions: Yes LLE Weight Bearing: Weight bearing as tolerated      Mobility Bed Mobility Overal bed mobility: Needs  Assistance             General bed mobility comments: Attempted to engage pt in EOB activities. However, with minimal movement of L LE to EOB, pt yelling, crying out in pain and hitting bed yelling for therapists to stop. Total A x 2 to scoot up in bed to reposition    Transfers                 General transfer comment: unable    Balance Overall balance assessment:  (to be further assessed)                                         ADL either performed or assessed with clinical judgement   ADL Overall ADL's : Needs assistance/impaired Eating/Feeding: Set up;Sitting   Grooming: Minimal assistance;Bed level   Upper Body Bathing: Moderate assistance;Bed level   Lower Body Bathing: Total assistance;Bed level   Upper Body Dressing : Maximal assistance;Bed level   Lower Body Dressing: Total assistance;Bed level Lower Body Dressing Details (indicate cue type and reason): Attempted to don sock in bed but pt crying out in pain and declined to have sock donned     Toileting- Clothing Manipulation and Hygiene: Total assistance;Bed level         General ADL Comments: Pt greatly limited by pain, anxiety and endurance     Vision Patient Visual Report: No change from baseline Vision Assessment?: No apparent visual deficits     Perception     Praxis      Pertinent Vitals/Pain Pain Assessment: Faces Faces Pain  Scale: Hurts worst Pain Location: L hip Pain Descriptors / Indicators: Grimacing;Operative site guarding;Moaning;Sharp;Shooting;Other (Comment) (yelling) Pain Intervention(s): Monitored during session;Premedicated before session;Patient requesting pain meds-RN notified;Limited activity within patient's tolerance;RN gave pain meds during session     Hand Dominance Right   Extremity/Trunk Assessment Upper Extremity Assessment Upper Extremity Assessment: LUE deficits/detail LUE Deficits / Details: L hemiparesis unable to fully extend digits  (palm guard in place), wrist flexed, able to lift arm off of bed to stomach but unable to reach higher than this against gravity. LUE Coordination: decreased fine motor;decreased gross motor   Lower Extremity Assessment Lower Extremity Assessment: Defer to PT evaluation       Communication Communication Communication: No difficulties   Cognition Arousal/Alertness: Awake/alert Behavior During Therapy: WFL for tasks assessed/performed;Anxious Overall Cognitive Status: Impaired/Different from baseline Area of Impairment: Orientation;Memory;Safety/judgement;Awareness                 Orientation Level: Time   Memory: Decreased short-term memory   Safety/Judgement: Decreased awareness of deficits;Decreased awareness of safety Awareness: Emergent   General Comments: Pt perseverating on pain, crying out with active or passive movement and when not even touching leg. Difficult to redirect and attempt tasks. Requesting pain meds though was premedicated prior to session - pt did not recall getting morphine   General Comments  Pt on 4 L o2, SpO2 and HR WFL during session.    Exercises     Shoulder Instructions      Home Living Family/patient expects to be discharged to:: Private residence Living Arrangements: Spouse/significant other Available Help at Discharge: Family Type of Home: House Home Access: Ramped entrance     Felicity: One level     Bathroom Shower/Tub: Teacher, early years/pre: [place]: Shower seat;Grab bars - tub/shower;Hospital bed;Wheelchair - manual;Cane - quad   Additional Comments: Pt denies wearing O2 at home      Prior Functioning/Environment Level of Independence: Needs assistance  Gait / Transfers Assistance Needed: Pt reports using quad cane for mobility in the home ADL's / Homemaking Assistance Needed: Pt reports typically able to dress self, has PCA that comes for 2 hours M-F to assist with showering. Husband  assists on the weekend days as needed. Reports still completing some IADLs such as cooking            OT Problem List: Decreased strength;Decreased activity tolerance;Impaired balance (sitting and/or standing);Decreased coordination;Decreased cognition;Decreased range of motion;Decreased safety awareness;Decreased knowledge of use of DME or AE;Pain      OT Treatment/Interventions: Self-care/ADL training;Therapeutic exercise;DME and/or AE instruction;Energy conservation;Therapeutic activities;Patient/family education;Balance training    OT Goals(Current goals can be found in the care plan section) Acute Rehab OT Goals Patient Stated Goal: pain control OT Goal Formulation: With patient Time For Goal Achievement: 12/02/20 Potential to Achieve Goals: Good ADL Goals Pt Will Perform Lower Body Bathing: with mod assist;sitting/lateral leans Pt Will Perform Upper Body Dressing: with min assist;sitting Pt Will Perform Lower Body Dressing: with mod assist;sitting/lateral leans Pt Will Transfer to Toilet: with mod assist;stand pivot transfer;bedside commode Additional ADL Goal #1: Pt to demonstrate ability to tolerate sitting EOB > 5 min during ADL tasks with no more than min guard for balance  OT Frequency: Min 2X/week   Barriers to D/C:            Co-evaluation PT/OT/SLP Co-Evaluation/Treatment: Yes Reason for Co-Treatment: For patient/therapist safety;To address functional/ADL transfers   OT goals addressed during session: ADL's and self-care  AM-PAC OT "6 Clicks" Daily Activity     Outcome Measure Help from another person eating meals?: A Little Help from another person taking care of personal grooming?: A Little Help from another person toileting, which includes using toliet, bedpan, or urinal?: Total Help from another person bathing (including washing, rinsing, drying)?: Total Help from another person to put on and taking off regular upper body clothing?: A Lot Help from  another person to put on and taking off regular lower body clothing?: Total 6 Click Score: 11   End of Session Equipment Utilized During Treatment: Oxygen Nurse Communication: Mobility status;Patient requests pain meds  Activity Tolerance: Patient limited by pain Patient left: in bed;with call bell/phone within reach;with bed alarm set  OT Visit Diagnosis: Other abnormalities of gait and mobility (R26.89);Muscle weakness (generalized) (M62.81);History of falling (Z91.81);Pain Pain - Right/Left: Left Pain - part of body: Hip                Time: [id number] OT Time Calculation (min): 33 min Charges:  OT General Charges $OT Visit: 1 Visit OT Evaluation $OT Eval Moderate Complexity: 1 Mod  Layla Maw, OTR/L  Layla Maw 11/18/2020, 11:26 AM

## 2020-11-18 NOTE — Progress Notes (Signed)
PROGRESS NOTE    Margaret [name]  ONG:[id number] DOB: May 25, 1956 DOA: 11/14/2020 PCP: Patient, No Pcp Per     Brief Narrative:  64 y.o. WF PMHx Panic attacks, chronic Diastolic CHF, s/p Mitral Valve repair with Annoloplasty on Eliquis, Endocarditis, COPD, Pulmonary HTN, Obesity ,Subarachnoid bleed, CVA, Subarachnoid bleed DM type II controlled with hyperglycemia, Splenic infarction, CKD stage IIIa  Presents to the emergency department after sustaining fall at home.  Patient has left sided weakness at baseline due to prior stroke and ambulates with a cane.  She states that she slipped and fell while at home, she does not think she hit her head.  Left hip pain was rated as 10/10 on pain scale without any aggravating or alleviating factors she denies headache, dizziness, chest pain, shortness of breath or abdominal pain.  ED Course:  In the emergency department, she was hemodynamically stable.  Work-up in the ED showed leukocytosis, thrombocytosis. CT head without contrast showed no evidence of acute intracranial abnormality CT cervical spine showed no evidence of acute fracture or traumatic malalignment but showed a moderate degenerative disc disease at C6-C7. Left hip x-ray showed intertrochanteric femur fracture on the left with varus angulation at the fracture site.  IV opioid medications were given.  Orthopedic surgery was consulted by ED team.  Hospitalist was asked to admit patient for further evaluation and management   Subjective: 12/27 afebrile overnight, A/O x4, pain to left leg appropriate to surgery.   Assessment & Plan: Covid vaccination; vaccinated   Principal Problem:   Closed left femoral fracture (HCC) Active Problems:   COPD (chronic obstructive pulmonary disease) (HCC)   Chronic diastolic CHF (congestive heart failure) (HCC)   Morbid obesity (HCC)   Essential hypertension   HLD (hyperlipidemia)   History of CVA with residual deficit   Hyperglycemia due to  diabetes mellitus (HCC)   Fall at home, initial encounter   Acute renal failure superimposed on stage 3a chronic kidney disease (HCC)  LEFT femoral intertrochanteric fracture secondary to mechanical fall -Scheduled for operative repair Sunday, secondary to patient being on Eliquis.Marland Kitchen -LEFT hip x-ray; showed intertrochanteric femur fracture on the left with varus angulation at the fracture site.  -Pain medication per orthopedic surgery.  -Fall precautions -Neuro checks  Chronic diastolic CHF -Bisoprolol 5 mg daily -Bumex 1 mg daily -Strict I&O -1.1 L -Daily weight Filed Weights   11/14/20 1443 11/17/20 0500 11/18/20 0500  Weight: 127.5 kg 127.6 kg 128.4 kg    Essential HTN -See CHF  Mitral valve repair -On Eliquis (hold ) secondary to upcoming surgery -11/28 check with surgery about restarting Eliquis  COPD -Per patient not on home O2 or CPAP -Albuterol PRN  Hx of CVA -Hx residual left-sided weakness  DM type II uncontrolled with hyperglycemia -12/23 hemoglobin A1c= 8.5 -12/27 Lantus 8 units daily -Moderate SSI  Acute on CKD stage IIIa(baseline Cr appears to be~1.19) Lab Results  Component Value Date   CREATININE 1.16 (H) 11/18/2020   CREATININE 1.32 (H) 11/17/2020   CREATININE 1.34 (H) 11/16/2020   CREATININE 1.24 (H) 11/15/2020   CREATININE 1.19 (H) 11/14/2020    Leukocytosis -Most likely reactive given patient has negative fever, negative bands, negative left shift  Thrombocytosis -Resolved  HLD -Lipid panel pending -Crestor 10 mg daily  Morbid obesity -Post surgery will benefit from nutritional consult for lifestyle modification    DVT prophylaxis: Eliquis (on hold) for Sunday surgery Code Status: Full Family Communication:  Status is: Inpatient    Dispo: The patient  is from: Home              Anticipated d/c is to: SNF              Anticipated d/c date is: 12/30              Patient currently unstable      Consultants:   Orthopedic surgery  Procedures/Significant Events:    I have personally reviewed and interpreted all radiology studies and my findings are as above.  VENTILATOR SETTINGS: Nasal cannula 12/27 Flow 3 L/min SPO2 99%   Cultures   Antimicrobials:    Devices    LINES / TUBES:      Continuous Infusions: . methocarbamol (ROBAXIN) IV 500 mg (11/17/20 1207)     Objective: Vitals:   11/18/20 0839 11/18/20 1027 11/18/20 1226 11/18/20 1429  BP: 122/63 137/73  112/66  Pulse: 70 70  60  Resp: 18 (!) 22  16  Temp: 98.3 F (36.8 C)   98.3 F (36.8 C)  TempSrc: Oral Oral    SpO2: 99% 100% 100% 94%  Weight:      Height:        Intake/Output Summary (Last 24 hours) at 11/18/2020 1812 Last data filed at 11/18/2020 1452 Gross per 24 hour  Intake 1490 ml  Output 1800 ml  Net -310 ml   Filed Weights   11/14/20 1443 11/17/20 0500 11/18/20 0500  Weight: 127.5 kg 127.6 kg 128.4 kg    Physical Exam:  General: A/O x4 No acute respiratory distress Eyes: negative scleral hemorrhage, negative anisocoria, negative icterus ENT: Negative Runny nose, negative gingival bleeding, Neck:  Negative scars, masses, torticollis, lymphadenopathy, JVD Lungs: Clear to auscultation bilaterally without wheezes or crackles Cardiovascular: Regular rate and rhythm without murmur gallop or rub normal S1 and S2 Abdomen: MORBIDLY OBESE, negative abdominal pain, nondistended, positive soft, bowel sounds, no rebound, no ascites, no appreciable mass Extremities: left lower extremity very tender to movement.  Left upper femur surgical dressing in place.  DP/TP pulse 2+ left lower extremity Skin: Negative rashes, lesions, ulcers Psychiatric:  Negative depression, negative anxiety, negative fatigue, negative mania  Central nervous system:  Cranial nerves II through XII intact, tongue/uvula midline, all extremities muscle strength 5/5, sensation intact throughout, negative dysarthria, negative expressive  aphasia, negative receptive aphasia.   Data Reviewed: Care during the described time interval was provided by me .  I have reviewed this patient's available data, including medical history, events of note, physical examination, and all test results as part of my evaluation.  CBC: Recent Labs  Lab 11/14/20 1444 11/15/20 0304 11/16/20 0138 11/17/20 0056 11/18/20 0251  WBC 14.4* 15.2* 13.5* 14.4* 14.8*  NEUTROABS 11.8*  --  10.6* 11.3* 12.3*  HGB 14.8 14.6 14.2 13.4 12.9  HCT 48.3* 46.5* 45.5 43.8 39.6  MCV 88.8 89.1 89.0 88.0 86.5  PLT 417* 412* 372 352 332   Basic Metabolic Panel: Recent Labs  Lab 11/14/20 1444 11/15/20 0304 11/16/20 0138 11/17/20 0056 11/18/20 0251  NA 135 137 132* 128* 133*  K 4.6 4.3 4.2 3.9 4.6  CL 100 96* 94* 92* 96*  CO2 24 33* 30 27 27   GLUCOSE 193* 157* 151* 328* 253*  BUN 14 15 18 21 20   CREATININE 1.19* 1.24* 1.34* 1.32* 1.16*  CALCIUM 9.4 9.8 9.6 9.3 9.9  MG  --  1.8 1.8 1.6* 1.9  PHOS  --  4.1 3.5 2.1* 2.2*   GFR: Estimated Creatinine Clearance: 65.1 mL/min (A) (by  C-G formula based on SCr of 1.16 mg/dL (H)). Liver Function Tests: Recent Labs  Lab 11/15/20 0304 11/16/20 0138 11/17/20 0056 11/18/20 0251  AST 23 14* 13* 12*  ALT 22 16 13 12   ALKPHOS 91 83 74 80  BILITOT 1.0 1.3* 1.1 0.7  PROT 7.1 7.0 6.3* 6.6  ALBUMIN 3.0* 2.8* 2.4* 2.2*   No results for input(s): LIPASE, AMYLASE in the last 168 hours. No results for input(s): AMMONIA in the last 168 hours. Coagulation Profile: Recent Labs  Lab 11/14/20 1703 11/15/20 0304  INR 1.2 1.1   Cardiac Enzymes: No results for input(s): CKTOTAL, CKMB, CKMBINDEX, TROPONINI in the last 168 hours. BNP (last 3 results) No results for input(s): PROBNP in the last 8760 hours. HbA1C: No results for input(s): HGBA1C in the last 72 hours. CBG: Recent Labs  Lab 11/17/20 1555 11/17/20 2133 11/18/20 0626 11/18/20 1130 11/18/20 1624  GLUCAP 254* 327* 206* 186* 187*   Lipid  Profile: Recent Labs    11/16/20 0138  CHOL 104  HDL 34*  LDLCALC 54  TRIG 81  CHOLHDL 3.1   Thyroid Function Tests: No results for input(s): TSH, T4TOTAL, FREET4, T3FREE, THYROIDAB in the last 72 hours. Anemia Panel: No results for input(s): VITAMINB12, FOLATE, FERRITIN, TIBC, IRON, RETICCTPCT in the last 72 hours. Sepsis Labs: No results for input(s): PROCALCITON, LATICACIDVEN in the last 168 hours.  Recent Results (from the past 240 hour(s))  Resp Panel by RT-PCR (Flu A&B, Covid) Nasopharyngeal Swab     Status: None   Collection Time: 11/14/20  2:44 PM   Specimen: Nasopharyngeal Swab; Nasopharyngeal(NP) swabs in vial transport medium  Result Value Ref Range Status   SARS Coronavirus 2 by RT PCR NEGATIVE NEGATIVE Final    Comment: (NOTE) SARS-CoV-2 target nucleic acids are NOT DETECTED.  The SARS-CoV-2 RNA is generally detectable in upper respiratory specimens during the acute phase of infection. The lowest concentration of SARS-CoV-2 viral copies this assay can detect is 138 copies/mL. A negative result does not preclude SARS-Cov-2 infection and should not be used as the sole basis for treatment or other patient management decisions. A negative result may occur with  improper specimen collection/handling, submission of specimen other than nasopharyngeal swab, presence of viral mutation(s) within the areas targeted by this assay, and inadequate number of viral copies(<138 copies/mL). A negative result must be combined with clinical observations, patient history, and epidemiological information. The expected result is Negative.  Fact Sheet for Patients:  BloggerCourse.com  Fact Sheet for Healthcare Providers:  SeriousBroker.it  This test is no t yet approved or cleared by the [place] FDA and  has been authorized for detection and/or diagnosis of SARS-CoV-2 by FDA under an Emergency Use Authorization (EUA). This EUA  will remain  in effect (meaning this test can be used) for the duration of the COVID-19 declaration under Section 564(b)(1) of the Act, 21 U.S.C.section 360bbb-3(b)(1), unless the authorization is terminated  or revoked sooner.       Influenza A by PCR NEGATIVE NEGATIVE Final   Influenza B by PCR NEGATIVE NEGATIVE Final    Comment: (NOTE) The Xpert Xpress SARS-CoV-2/FLU/RSV plus assay is intended as an aid in the diagnosis of influenza from Nasopharyngeal swab specimens and should not be used as a sole basis for treatment. Nasal washings and aspirates are unacceptable for Xpert Xpress SARS-CoV-2/FLU/RSV testing.  Fact Sheet for Patients: BloggerCourse.com  Fact Sheet for Healthcare Providers: SeriousBroker.it  This test is not yet approved or cleared by the [place]  States FDA and has been authorized for detection and/or diagnosis of SARS-CoV-2 by FDA under an Emergency Use Authorization (EUA). This EUA will remain in effect (meaning this test can be used) for the duration of the COVID-19 declaration under Section 564(b)(1) of the Act, 21 U.S.C. section 360bbb-3(b)(1), unless the authorization is terminated or revoked.  Performed at [facility], [address] [place], [place] [address]   Surgical PCR screen     Status: Abnormal   Collection Time: 11/15/20  5:37 AM   Specimen: Nasal Mucosa; Nasal Swab  Result Value Ref Range Status   MRSA, PCR POSITIVE (A) NEGATIVE Final    Comment: RESULT CALLED TO, READ BACK BY AND VERIFIED WITH: Redmond Baseman RN 9:45 11/15/20 ([email or web address])    Staphylococcus aureus POSITIVE (A) NEGATIVE Final    Comment: (NOTE) The Xpert SA Assay (FDA approved for NASAL specimens in patients 26 years of age and older), is one component of a comprehensive surveillance program. It is not intended to diagnose infection nor to guide or monitor treatment. Performed at [facility], [address]  [place], [place] [address]          Radiology Studies: DG C-Arm 1-60 Min  Result Date: 11/17/2020 CLINICAL DATA:  Left intratrochanteric femoral fracture EXAM: DG C-ARM 1-60 MIN; LEFT FEMUR 2 VIEWS COMPARISON:  None. FLUOROSCOPY TIME:  Fluoroscopy Time:  45 seconds Radiation Exposure Index (if provided by the fluoroscopic device): 18.12 mGy Number of Acquired Spot Images: 5 FINDINGS: Left femoral rod is noted with proximal fixation screw. Fracture fragments are in near anatomic alignment. IMPRESSION: ORIF of proximal left femoral fracture Electronically Signed   By: Alcide Clever M.D.   On: 11/17/2020 10:22   DG FEMUR MIN 2 VIEWS LEFT  Result Date: 11/17/2020 CLINICAL DATA:  Left intratrochanteric femoral fracture EXAM: DG C-ARM 1-60 MIN; LEFT FEMUR 2 VIEWS COMPARISON:  None. FLUOROSCOPY TIME:  Fluoroscopy Time:  45 seconds Radiation Exposure Index (if provided by the fluoroscopic device): 18.12 mGy Number of Acquired Spot Images: 5 FINDINGS: Left femoral rod is noted with proximal fixation screw. Fracture fragments are in near anatomic alignment. IMPRESSION: ORIF of proximal left femoral fracture Electronically Signed   By: Alcide Clever M.D.   On: 11/17/2020 10:22        Scheduled Meds: . apixaban  2.5 mg Oral BID  . bisoprolol  5 mg Oral Daily  . bumetanide  1 mg Oral Daily  . docusate sodium  100 mg Oral BID  . insulin aspart  0-15 Units Subcutaneous TID WC  . insulin aspart  0-5 Units Subcutaneous QHS  . insulin glargine  8 Units Subcutaneous Daily  . mupirocin ointment  1 application Nasal BID  . pantoprazole  20 mg Oral Daily  . rosuvastatin  10 mg Oral Daily   Continuous Infusions: . methocarbamol (ROBAXIN) IV 500 mg (11/17/20 1207)     LOS: 4 days    Time spent:0 min    Daniya Aramburo, Roselind Messier, MD [facility] Pager [phone]  If 7PM-7AM, please contact night-coverage [email or web address] Password [facility] 11/18/2020, 6:12 PM

## 2020-11-18 NOTE — Progress Notes (Signed)
Subjective: Patient reports pain as severe. She is groaning and yelling out in pain. Breathing heavy. Difficult to collect her thoughts together and verbalize them. Seems confused as to why she is there, what surgery she had, what medicines she is on. She had a lot of questions that I answered. Tolerating diet.  No CP, SOB, but did cough up thick yellow phlegm 1 time in my presence.   No weakness/dizziness.  Not yet mobilized.  Objective:   VITALS:   Vitals:   11/18/20 0026 11/18/20 0406 11/18/20 0500 11/18/20 0839  BP: (!) 148/73 125/64  122/63  Pulse: 69 66  70  Resp: 18 17  18   Temp: 98.5 F (36.9 C) 98.7 F (37.1 C)  98.3 F (36.8 C)  TempSrc: Oral Oral  Oral  SpO2: 99% 98%  99%  Weight:   128.4 kg   Height:       CBC Latest Ref Rng & Units 11/18/2020 11/17/2020 11/16/2020  WBC 4.0 - 10.5 K/uL 14.8(H) 14.4(H) 13.5(H)  Hemoglobin 12.0 - 15.0 g/dL 12.9 13.4 14.2  Hematocrit 36.0 - 46.0 % 39.6 43.8 45.5  Platelets 150 - 400 K/uL 332 352 372   BMP Latest Ref Rng & Units 11/18/2020 11/17/2020 11/16/2020  Glucose 70 - 99 mg/dL 253(H) 328(H) 151(H)  BUN 8 - 23 mg/dL 20 21 18   Creatinine 0.44 - 1.00 mg/dL 1.16(H) 1.32(H) 1.34(H)  Sodium 135 - 145 mmol/L 133(L) 128(L) 132(L)  Potassium 3.5 - 5.1 mmol/L 4.6 3.9 4.2  Chloride 98 - 111 mmol/L 96(L) 92(L) 94(L)  CO2 22 - 32 mmol/L 27 27 30   Calcium 8.9 - 10.3 mg/dL 9.9 9.3 9.6   Intake/Output      12/26 0701 12/27 0700 12/27 0701 12/28 0700   P.O. 720 360   I.V. (mL/kg) 700 (5.5)    IV Piggyback 100    Total Intake(mL/kg) 1520 (11.8) 360 (2.8)   Urine (mL/kg/hr) 800 (0.3)    Blood 50    Total Output 850    Net +670 +360            Physical Exam: General:  Laying in bed. Hyper and distressed attitude. Breathing heavy. Groaning in pain.  Resp: increased wob Cardio: RRR. No m/r/g ABD soft Neurologically intact despite being upset Skin bruise on her Left shin MSK Neurovascularly intact Sensation intact  distally Intact pulses distally Dorsiflexion/Plantar flexion intact Incision: dressing C/D/I Compartment soft    Assessment: 1 Day Post-Op  S/P Procedure(s) (LRB): INTRAMEDULLARY (IM) NAIL INTERTROCHANTRIC (Left) by Dr. Ernesta Amble. Percell Miller on 11/17/20  Principal Problem:   Closed left femoral fracture ([place]) Active Problems:   COPD (chronic obstructive pulmonary disease) (H[facility])   Chronic diastolic CHF (congestive heart failure) ([place])   Morbid obesity (HCC)   Essential hypertension   HLD (hyperlipidemia)   History of CVA with residual deficit   Hyperglycemia due to diabetes mellitus ([place])   Fall at home, initial encounter    Closed left intertrochanteric femur fracture, status post IM nail Doing ok postop day 1 Tolerating diet and voiding. No BM yet Pain not well controlled Not yet mobilized  Plan: Up with therapy Discussed with patient the importance of the Incentive Spirometry, how often to use it, why it is needed.  Apply ice PRN Stay on top of pain with use of meds.  Weightbearing: as tolerated Insicional and dressing care: Dressings left intact until follow-up Showering: Keep dressing dry VTE prophylaxis: Resume Eliquis, SCDs, ambulation Pain control:  Continue current  regimen. Contact information:  Margarita Rana MD, Levester Fresh PA-C  Dispo: Skilled nursing facility/rehab    Jenne Pane, Cordelia Poche 11/18/2020, 10:25 AM

## 2020-11-18 NOTE — Evaluation (Signed)
Physical Therapy Evaluation Patient Details Name: Margaret [name] MRN: [id number] DOB: March 16, 1956 Today's Date: 11/18/2020   History of Present Illness  Margaret [name] is a 64 y.o. female with past medical history significant for endocarditis, CHF, COPD, obesity, pulmonary hypertension, valve repair, subarachnoid bleed, CVA, diabetes who presents for evaluation after fall. She had a left sided weakness after prior stroke. She just discontinued using a wheelchair in August of this year. She normally ambulates with a cane. She fell yesterday and had immediate pain in her left hip with inability to bear weight. Orthopedics was consulted for left femur fracture and underwent IM nailing on 12/26    Clinical Impression  Margaret [name] is a 64 y.o. female POD 1 s/p Lt femur IM nail. Patient reports she mobilizes with quad cane at home and has a wheelchair for long distances with mobility at baseline. Patient has a home aide who assists 2 hrs/day M-F and assist from her husband as needed. Patient is now limited by functional impairments (see PT problem list below) and was unable to progress mobility to EOB due to significant Lt hip/LE pain. AAROM performed to assist Lt hip/LE mobility in bed however very limited due to pt crying out with light touch/movement. 2+ total assist required to boost pt to reposition in bed. Patient will benefit from continued skilled PT interventions to address impairments and progress towards PLOF. Acute PT will follow to progress mobility as able. Recommend SNF level follow up for therapy and 24/7 care.     Follow Up Recommendations SNF;Supervision/Assistance - 24 hour    Equipment Recommendations  None recommended by PT    Recommendations for Other Services       Precautions / Restrictions Precautions Precautions: Fall;Other (comment) Precaution Comments: L sided hemiparesis from previous CVA Restrictions Weight Bearing Restrictions: Yes LLE Weight  Bearing: Weight bearing as tolerated      Mobility  Bed Mobility Overal bed mobility: Needs Assistance             General bed mobility comments: pt resistant to participate in EOB activity due to significant pain in Lt LE. Pt crying out and yelling "stop" with light touch of sock even to Lt foot. Total Assist to reposition in bed to boost superiorly, pt screaming in pain with this.    Transfers                 General transfer comment: unable  Ambulation/Gait                Stairs            Wheelchair Mobility    Modified Rankin (Stroke Patients Only)       Balance Overall balance assessment:  (to be further assessed)                                           Pertinent Vitals/Pain Pain Assessment: Faces Faces Pain Scale: Hurts worst Pain Location: L hip Pain Descriptors / Indicators: Grimacing;Operative site guarding;Moaning;Sharp;Shooting;Other (Comment) (yelling) Pain Intervention(s): Limited activity within patient's tolerance;Premedicated before session;Monitored during session;Repositioned;Patient requesting pain meds-RN notified;RN gave pain meds during session;Relaxation    Home Living Family/patient expects to be discharged to:: Private residence Living Arrangements: Spouse/significant other Available Help at Discharge: Family Type of Home: House Home Access: Ramped entrance     Home Layout: One level Home Equipment: Shower seat;Grab  bars - tub/shower;Hospital bed;Wheelchair - manual;Cane - quad Additional Comments: Pt denies wearing O2 at home    Prior Function Level of Independence: Needs assistance   Gait / Transfers Assistance Needed: Pt reports using quad cane for mobility in the home.  ADL's / Homemaking Assistance Needed: Pt reports typically able to dress self, has PCA that comes for 2 hours M-F to assist with showering. Husband assists on the weekend days as needed. Reports still completing some IADLs  such as cooking  Comments: Pt reports she is part of the [facility] and that they can provide transportation to and from therapy appointments if needed.     Hand Dominance   Dominant Hand: Right    Extremity/Trunk Assessment   Upper Extremity Assessment Upper Extremity Assessment: Defer to OT evaluation LUE Deficits / Details: L hemiparesis unable to fully extend digits (palm guard in place), wrist flexed, able to lift arm off of bed to stomach but unable to reach higher than this against gravity. LUE Coordination: decreased fine motor;decreased gross motor    Lower Extremity Assessment Lower Extremity Assessment: LLE deficits/detail LLE Deficits / Details: Lt hemiparesis at baseline, pt VERY limited by pain this date. able to perfomr ankle dorsi/plantar flexion in limited range due to pain. attempted quad set but no limited activation1-/5. pt required AAROM for hip abduction/adduction in limited ROM with assistance to keep hip in neutral position and void being externally rotated. LLE: Unable to fully assess due to pain LLE Coordination: decreased gross motor;decreased fine motor    Cervical / Trunk Assessment Cervical / Trunk Assessment: Other exceptions Cervical / Trunk Exceptions: body habitus  Communication   Communication: No difficulties  Cognition Arousal/Alertness: Awake/alert Behavior During Therapy: WFL for tasks assessed/performed;Anxious Overall Cognitive Status: Impaired/Different from baseline Area of Impairment: Orientation;Memory;Safety/judgement;Awareness                 Orientation Level: Time   Memory: Decreased short-term memory   Safety/Judgement: Decreased awareness of deficits;Decreased awareness of safety Awareness: Emergent   General Comments: Pt perseverating on pain, crying out with active or passive movement and when not even touching leg. Difficult to redirect and attempt tasks. Requesting pain meds though was premedicated prior to  session - pt did not recall getting morphine      General Comments General comments (skin integrity, edema, etc.): pt on 4L/min, HR is 60's and SpO2 88-100% with dropped occuring when pt gripping rail and crying out in pain.    Exercises     Assessment/Plan    PT Assessment Patient needs continued PT services  PT Problem List Decreased strength;Decreased range of motion;Decreased activity tolerance;Decreased balance;Decreased mobility;Decreased coordination;Decreased knowledge of use of DME;Decreased knowledge of precautions;Decreased safety awareness;Obesity;Pain       PT Treatment Interventions DME instruction;Gait training;Functional mobility training;Therapeutic activities;Therapeutic exercise;Balance training;Patient/family education    PT Goals (Current goals can be found in the Care Plan section)  Acute Rehab PT Goals Patient Stated Goal: pain control PT Goal Formulation: With patient Time For Goal Achievement: 12/02/20 Potential to Achieve Goals: Fair    Frequency Min 3X/week   Barriers to discharge        Co-evaluation PT/OT/SLP Co-Evaluation/Treatment: Yes Reason for Co-Treatment: For patient/therapist safety;To address functional/ADL transfers PT goals addressed during session: Mobility/safety with mobility;Strengthening/ROM OT goals addressed during session: ADL's and self-care       AM-PAC PT "6 Clicks" Mobility  Outcome Measure Help needed turning from your back to your side while in a flat bed without  using bedrails?: Total Help needed moving from lying on your back to sitting on the side of a flat bed without using bedrails?: Total Help needed moving to and from a bed to a chair (including a wheelchair)?: Total Help needed standing up from a chair using your arms (e.g., wheelchair or bedside chair)?: Total Help needed to walk in hospital room?: Total Help needed climbing 3-5 steps with a railing? : Total 6 Click Score: 6    End of Session Equipment  Utilized During Treatment: Oxygen Activity Tolerance: Patient limited by pain Patient left: in bed;with call bell/phone within reach;with bed alarm set;with nursing/sitter in room Nurse Communication: Mobility status PT Visit Diagnosis: Other abnormalities of gait and mobility (R26.89);Muscle weakness (generalized) (M62.81);Difficulty in walking, not elsewhere classified (R26.2);Pain;Hemiplegia and hemiparesis Hemiplegia - Right/Left: Left Hemiplegia - dominant/non-dominant: Non-dominant Hemiplegia - caused by: Cerebral infarction (prior/chronic) Pain - Right/Left: Left Pain - part of body: Hip;Leg    Time: 6314-9702 PT Time Calculation (min) (ACUTE ONLY): 32 min   Charges:   PT Evaluation $PT Eval Moderate Complexity: 1 Mod          Wynn Maudlin, DPT Acute Rehabilitation Services Office [phone] Pager [phone]    Anitra Lauth 11/18/2020, 12:23 PM

## 2020-11-18 NOTE — Plan of Care (Signed)
  Problem: Education: Goal: Knowledge of General Education information will improve Description: Including pain rating scale, medication(s)/side effects and non-pharmacologic comfort measures Outcome: Progressing   Problem: Clinical Measurements: Goal: Will remain free from infection Outcome: Progressing   Problem: Coping: Goal: Level of anxiety will decrease Outcome: Progressing   Problem: Elimination: Goal: Will not experience complications related to bowel motility Outcome: Progressing   

## 2020-11-18 NOTE — TOC Progression Note (Signed)
Transition of Care [facility]) - Progression Note    Patient Details  Name: Margaret [name] MRN: [id number] Date of Birth: [birth year]  [facility] CM/SW Contact  Carley Hammed, [place] Phone Number: 11/18/2020, 1:47 PM  Clinical Narrative:     CSW received call from Onalee Hua, a Child psychotherapist at [employer]. He notified CSW that pt was a [facility] pt and was updated on disposition. His number is [phone].       Expected Discharge Plan and Services                                                 Social Determinants of Health (SDOH) Interventions    Readmission Risk Interventions No flowsheet data found.

## 2020-11-19 ENCOUNTER — Inpatient Hospital Stay (HOSPITAL_COMMUNITY): Payer: No Typology Code available for payment source

## 2020-11-19 ENCOUNTER — Encounter (HOSPITAL_COMMUNITY): Payer: Self-pay | Admitting: Orthopedic Surgery

## 2020-11-19 DIAGNOSIS — S72142A Displaced intertrochanteric fracture of left femur, initial encounter for closed fracture: Secondary | ICD-10-CM | POA: Diagnosis not present

## 2020-11-19 DIAGNOSIS — I5032 Chronic diastolic (congestive) heart failure: Secondary | ICD-10-CM | POA: Diagnosis not present

## 2020-11-19 DIAGNOSIS — W19XXXA Unspecified fall, initial encounter: Secondary | ICD-10-CM | POA: Diagnosis not present

## 2020-11-19 DIAGNOSIS — J431 Panlobular emphysema: Secondary | ICD-10-CM | POA: Diagnosis not present

## 2020-11-19 LAB — CBC WITH DIFFERENTIAL/PLATELET
Abs Immature Granulocytes: 0.08 10*3/uL — ABNORMAL HIGH (ref 0.00–0.07)
Basophils Absolute: 0.1 10*3/uL (ref 0.0–0.1)
Basophils Relative: 1 %
Eosinophils Absolute: 0.3 10*3/uL (ref 0.0–0.5)
Eosinophils Relative: 2 %
HCT: 43.6 % (ref 36.0–46.0)
Hemoglobin: 13.2 g/dL (ref 12.0–15.0)
Immature Granulocytes: 1 %
Lymphocytes Relative: 20 %
Lymphs Abs: 2.8 10*3/uL (ref 0.7–4.0)
MCH: 27 pg (ref 26.0–34.0)
MCHC: 30.3 g/dL (ref 30.0–36.0)
MCV: 89.2 fL (ref 80.0–100.0)
Monocytes Absolute: 1.3 10*3/uL — ABNORMAL HIGH (ref 0.1–1.0)
Monocytes Relative: 10 %
Neutro Abs: 9.1 10*3/uL — ABNORMAL HIGH (ref 1.7–7.7)
Neutrophils Relative %: 66 %
Platelets: 399 10*3/uL (ref 150–400)
RBC: 4.89 MIL/uL (ref 3.87–5.11)
RDW: 15.8 % — ABNORMAL HIGH (ref 11.5–15.5)
WBC: 13.6 10*3/uL — ABNORMAL HIGH (ref 4.0–10.5)
nRBC: 0 % (ref 0.0–0.2)

## 2020-11-19 LAB — COMPREHENSIVE METABOLIC PANEL
ALT: 12 U/L (ref 0–44)
AST: 14 U/L — ABNORMAL LOW (ref 15–41)
Albumin: 2.4 g/dL — ABNORMAL LOW (ref 3.5–5.0)
Alkaline Phosphatase: 80 U/L (ref 38–126)
Anion gap: 8 (ref 5–15)
BUN: 25 mg/dL — ABNORMAL HIGH (ref 8–23)
CO2: 33 mmol/L — ABNORMAL HIGH (ref 22–32)
Calcium: 10.4 mg/dL — ABNORMAL HIGH (ref 8.9–10.3)
Chloride: 93 mmol/L — ABNORMAL LOW (ref 98–111)
Creatinine, Ser: 1.29 mg/dL — ABNORMAL HIGH (ref 0.44–1.00)
GFR, Estimated: 46 mL/min — ABNORMAL LOW (ref >60)
Glucose, Bld: 201 mg/dL — ABNORMAL HIGH (ref 70–99)
Potassium: 4.8 mmol/L (ref 3.5–5.1)
Sodium: 134 mmol/L — ABNORMAL LOW (ref 135–145)
Total Bilirubin: 0.3 mg/dL (ref 0.3–1.2)
Total Protein: 6.8 g/dL (ref 6.5–8.1)

## 2020-11-19 LAB — GLUCOSE, CAPILLARY
Glucose-Capillary: 183 mg/dL — ABNORMAL HIGH (ref 70–99)
Glucose-Capillary: 237 mg/dL — ABNORMAL HIGH (ref 70–99)
Glucose-Capillary: 256 mg/dL — ABNORMAL HIGH (ref 70–99)
Glucose-Capillary: 285 mg/dL — ABNORMAL HIGH (ref 70–99)

## 2020-11-19 LAB — MAGNESIUM: Magnesium: 2.1 mg/dL (ref 1.7–2.4)

## 2020-11-19 LAB — PHOSPHORUS: Phosphorus: 2.4 mg/dL — ABNORMAL LOW (ref 2.5–4.6)

## 2020-11-19 MED ORDER — SODIUM CHLORIDE 0.9 % IV SOLN: INTRAVENOUS | Status: DC

## 2020-11-19 MED ORDER — INSULIN GLARGINE 100 UNIT/ML ~~LOC~~ SOLN
25.0000 [IU] | Freq: Every day | SUBCUTANEOUS | Status: DC
  Administered 2020-11-20 – 2020-11-21 (×2): 25 [IU] via SUBCUTANEOUS
  Filled 2020-11-19 (×2): qty 0.25

## 2020-11-19 MED ORDER — ACETAMINOPHEN 500 MG PO TABS
1000.0000 mg | ORAL_TABLET | Freq: Four times a day (QID) | ORAL | Status: DC
  Administered 2020-11-19 – 2020-11-26 (×27): 1000 mg via ORAL
  Filled 2020-11-19 (×30): qty 2

## 2020-11-19 NOTE — Plan of Care (Signed)
  Problem: Education: Goal: Knowledge of General Education information will improve Description: Including pain rating scale, medication(s)/side effects and non-pharmacologic comfort measures Outcome: Progressing   Problem: Clinical Measurements: Goal: Will remain free from infection Outcome: Progressing   Problem: Activity: Goal: Risk for activity intolerance will decrease Outcome: Progressing   

## 2020-11-19 NOTE — TOC CAGE-AID Note (Signed)
Transition of Care [facility]) - CAGE-AID Screening   Patient Details  Name: Margaret [name] MRN: [id number] Date of Birth: August 28, 1956  Clinical Narrative: Patient denies drug or alcohol use. No resources needed at this time   CAGE-AID Screening:    Have You Ever Felt You Ought to Cut Down on Your Drinking or Drug Use?: No Have People Annoyed You By Critizing Your Drinking Or Drug Use?: No Have You Felt Bad Or Guilty About Your Drinking Or Drug Use?: No Have You Ever Had a Drink or Used Drugs First Thing In The Morning to Steady Your Nerves or to Get Rid of a Hangover?: No CAGE-AID Score: 0

## 2020-11-19 NOTE — Progress Notes (Signed)
Subjective: Patient reports pain as severe despite receiving pain medicine (Morphine and Oxy). She also has Tylenol PRN but it doesn't appear she has received any in the last 48 hours. She says she has constant left thigh pain and any movement increases it to severe pain. PT/OT tried to work with her yesterday to get her mobilized and notes indicate she was crying out in pain the majority of the time and unable to perform most of the exercises. Tolerating diet. Does have a slight cough every so often particularly when trying to take a deep breath and blow into the IS. Denies CP, SOB,  Weakness/dizziness. Has not been able to mobilize OOB.   Objective:   VITALS:   Vitals:   11/18/20 2013 11/19/20 0242 11/19/20 0500 11/19/20 0712  BP: 123/69 124/60  123/70  Pulse: 83 71  70  Resp: 16 15  16   Temp: 98 F (36.7 C) 98.2 F (36.8 C)  98.3 F (36.8 C)  TempSrc: Oral Oral  Oral  SpO2: 93% 95%  97%  Weight:   126.8 kg   Height:       CBC Latest Ref Rng & Units 11/19/2020 11/18/2020 11/17/2020  WBC 4.0 - 10.5 K/uL 13.6(H) 14.8(H) 14.4(H)  Hemoglobin 12.0 - 15.0 g/dL 13.2 12.9 13.4  Hematocrit 36.0 - 46.0 % 43.6 39.6 43.8  Platelets 150 - 400 K/uL 399 332 352   BMP Latest Ref Rng & Units 11/19/2020 11/18/2020 11/17/2020  Glucose 70 - 99 mg/dL 201(H) 253(H) 328(H)  BUN 8 - 23 mg/dL 25(H) 20 21  Creatinine 0.44 - 1.00 mg/dL 1.29(H) 1.16(H) 1.32(H)  Sodium 135 - 145 mmol/L 134(L) 133(L) 128(L)  Potassium 3.5 - 5.1 mmol/L 4.8 4.6 3.9  Chloride 98 - 111 mmol/L 93(L) 96(L) 92(L)  CO2 22 - 32 mmol/L 33(H) 27 27  Calcium 8.9 - 10.3 mg/dL 10.4(H) 9.9 9.3   Intake/Output      12/27 0701 12/28 0700 12/28 0701 12/29 0700   P.O. 720 360   I.V. (mL/kg)     IV Piggyback     Total Intake(mL/kg) 720 (5.7) 360 (2.8)   Urine (mL/kg/hr) 2800 (0.9) 300 (0.5)   Blood     Total Output 2800 300   Net -2080 +60            Physical Exam: General: NAD. Sleeping in bed. Easily awoken. Much more  calm and clear headed this morning. Seemed to better understand everything we talked about. Conversant. MSK Neurovascularly intact Sensation intact distally Intact pulses distally Dorsiflexion/Plantar flexion intact Incision: covered with dressing that is C/D/I, some eechymosis seen around dressings that is to be expected postop   Assessment: 2 Days Post-Op  S/P Procedure(s) (LRB): INTRAMEDULLARY (IM) NAIL INTERTROCHANTRIC (Left) by Dr. Ernesta Amble. Percell Miller on 11/17/20  Principal Problem:   Closed left femoral fracture ([place]) Active Problems:   COPD (chronic obstructive pulmonary disease) (HCC)   Chronic diastolic CHF (congestive heart failure) ([place])   Morbid obesity (HCC)   Essential hypertension   HLD (hyperlipidemia)   History of CVA with residual deficit   Hyperglycemia due to diabetes mellitus ([place])   Fall at home, initial encounter   Acute renal failure superimposed on stage 3a chronic kidney disease ([place])   Closed left intertrochanteric femur fracture, status post IM nail Doing ok postop day 2 Tolerating diet and voiding Pain still not well controlled Not yet mobilized OOB  Plan: We discussed that she really needs to work to get up with  therapy today as it is important for her prognosis and recovery. She expressed understanding.  I would like to see better use of the pain medication she has available on a PRN basis. She has Tylenol ordered but it hasn't been given in the last 2 days. Instead she keeps getting Morphine and Oxy.  Due to the continued severe pain she is having, I have ordered another left femur xray to check for any changes.  Continue to use Incentive Spirometry Apply ice to area PRN  Weightbearing: WBAT LLE Insicional and dressing care: Dressings left intact until follow-up Showering: Keep dressing dry VTE prophylaxis: Eliquis, SCDs, ambulation Pain control: Use Tylenol more often and try to decrease narcotic use if able.  Contact information:  Margarita Rana MD, Levester Fresh PA-C  Dispo: TBD.  Therapy evaluations pending.  Discharge when mobilized and ready medically.   Jenne Pane, PA-C 11/19/2020, 12:07 PM

## 2020-11-19 NOTE — NC FL2 (Signed)
[place] LEVEL OF CARE SCREENING TOOL     IDENTIFICATION  Patient Name: Margaret [name] Birthdate: 08/12/1956 Sex: female Admission Date (Current Location): 11/14/2020  [facility] and [place] Number:  Herbalist and Address:  [facility] [place], [place] [address] [place], [place] [address]      Provider Number: [id number]  Attending Physician Name and Address:  Allie Bossier, MD  Relative Name and Phone Number:  Zoi Salo [phone]    Current Level of Care: Hospital Recommended Level of Care: [place] Prior Approval Number:    Date Approved/Denied:   PASRR Number: [id number]  Discharge Plan: SNF    Current Diagnoses: Patient Active Problem List   Diagnosis Date Noted  . Acute renal failure superimposed on stage 3a chronic kidney disease ([place]) 11/18/2020  . Closed left femoral fracture ([place]) 11/14/2020  . Hyperglycemia due to diabetes mellitus ([place]) 11/14/2020  . Fall at home, initial encounter 11/14/2020  . Left shoulder pain 06/29/2016  . Encounter for therapeutic drug monitoring 06/17/2016  . Spastic hemiplegia affecting left nondominant side ([place]) 06/12/2016  . Generalized anxiety disorder 06/12/2016  . Muscle spasticity   . Adjustment disorder with anxious mood   . Subtherapeutic anticoagulation   . PAF (paroxysmal atrial fibrillation) ([place])   . Hemiplegia of nondominant side, late effect of cerebrovascular disease ([place])   . 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 Hemiparesis and dysphasia as late effect of cerebrovascular accident (CVA) ([place])   . Flaccid hemiplegia affecting left nondominant side ([place])   . Interstitial edema   . Type 2 diabetes mellitus with peripheral neuropathy (HCC)   . Debilitated 05/12/2016  . Left hemiparesis ([place]) 05/12/2016  . History of subarachnoid hemorrhage   . SOB (shortness of breath)   . History of CVA with residual deficit   .  Dysarthria, post-stroke   . Chronic obstructive pulmonary disease ([place])   . Tachypnea   . Bradycardia   . Hypokalemia   . Leukocytosis   . SIRS (systemic inflammatory response syndrome) (HCC)   . Acute blood loss anemia   . Thrombocytosis   . S/P mitral valve repair 04/30/2016  . 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 Acute on chronic diastolic congestive heart failure due to valvular disease ([place]) 04/14/2016  . CHF due to valvular disease, acute on chronic, diastolic [id number]  . Chronic diastolic congestive heart failure due to valvular disease ([place]) 04/13/2016  . History of stroke 04/13/2016  . Cerebrovascular accident (CVA) due to occlusion of right carotid artery ([place]) 03/25/2016  . Splenic infarct 03/25/2016  . Type 2 diabetes mellitus with circulatory disorder ([place]) 03/25/2016  . Mitral regurgitation 03/06/2016  . HLD (hyperlipidemia) 03/06/2016  . Right carotid artery occlusion   . Epigastric abdominal tenderness on direct palpation   . Endocarditis of mitral valve   . Positive ANA (antinuclear antibody)   . Streptococcus viridans infection 02/04/2016  . Cerebrovascular accident (CVA) due to embolism of cerebral artery ([place])   . Subarachnoid hemorrhage ([place]) 02/01/2016  . Stroke ([place])   . Visual changes   . Pulmonary infiltrates 01/23/2016  . Pulmonary hypertension assoc with unclear multi-factorial mechanisms ([place]) 01/23/2016  . 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 Insulin dependent diabetes mellitus 01/14/2016  . Essential hypertension 01/14/2016  . Hepatic cirrhosis ([place]) 01/14/2016  . COPD (chronic obstructive pulmonary disease) ([place]) 04/27/2012  .  Chronic diastolic CHF (congestive heart failure) ([place]) 04/27/2012  . Morbid obesity ([place]) 04/27/2012  . Hypoxia 04/27/2012  . Dyspnea on exertion 04/27/2012  . Smoker 04/27/2012    Orientation RESPIRATION BLADDER Height & Weight      Self,Time,Situation,Place  Normal External catheter Weight: 279 lb 8.7 oz (126.8 kg) Height:  5' 4.02" (162.6 cm)  BEHAVIORAL SYMPTOMS/MOOD NEUROLOGICAL BOWEL NUTRITION STATUS      Incontinent Diet (See DC Summary)  AMBULATORY STATUS COMMUNICATION OF NEEDS Skin   Total Care Verbally Normal                       Personal Care Assistance Level of Assistance  Bathing,Feeding,Dressing Bathing Assistance: Maximum assistance Feeding assistance: Limited assistance Dressing Assistance: Maximum assistance     Functional Limitations Info  Sight,Hearing,Speech Sight Info: Adequate Hearing Info: Adequate Speech Info: Adequate    SPECIAL CARE FACTORS FREQUENCY  PT (By licensed PT),OT (By licensed OT)     PT Frequency: 5x week OT Frequency: 5x week            Contractures Contractures Info: Not present    Additional Factors Info  Code Status,Allergies,Insulin Sliding Scale Code Status Info: Full Allergies Info: Codiene, Lisinopril, Latex   Insulin Sliding Scale Info: Insulin Aspart (Novolog) 0-15 U 3x daily w/ meals, 0-5 U @ bedtime, Insulin Glargine (Lantus) 25 U daily       Current Medications (11/19/2020):  This is the current hospital active medication list Current Facility-Administered Medications  Medication Dose Route Frequency Provider Last Rate Last Admin  . 0.9 %  sodium chloride infusion   Intravenous Continuous Allie Bossier, MD 75 mL/hr at 11/19/20 1256 New Bag at 11/19/20 1256  . acetaminophen (TYLENOL) tablet 1,000 mg  1,000 mg Oral Q6H PRN Rachael Fee, PA-C   1,000 mg at 11/16/20 0507  . albuterol (VENTOLIN HFA) 108 (90 Base) MCG/ACT inhaler 2 puff  2 puff Inhalation Q4H PRN Adefeso, Oladapo, DO      . apixaban (ELIQUIS) tablet 2.5 mg  2.5 mg Oral BID Margy Clarks M, PA-C   2.5 mg at 11/19/20 0954  . bisacodyl (DULCOLAX) suppository 10 mg  10 mg Rectal Daily PRN Margy Clarks M, PA-C      . bisoprolol (ZEBETA) tablet 5 mg  5 mg Oral Daily  Adefeso, Oladapo, DO   5 mg at 11/19/20 1000  . bumetanide (BUMEX) tablet 1 mg  1 mg Oral Daily Adefeso, Oladapo, DO   1 mg at 11/19/20 1000  . diphenhydrAMINE (BENADRYL) injection 25 mg  25 mg Intravenous Q6H PRN Margy Clarks M, PA-C      . docusate sodium (COLACE) capsule 100 mg  100 mg Oral BID Margy Clarks M, PA-C   100 mg at 11/19/20 [id number]  . insulin aspart (novoLOG) injection 0-15 Units  0-15 Units Subcutaneous TID WC Adefeso, Oladapo, DO   5 Units at 11/19/20 1257  . insulin aspart (novoLOG) injection 0-5 Units  0-5 Units Subcutaneous QHS Adefeso, Oladapo, DO   3 Units at 11/18/20 2222  . [START ON 11/20/2020] insulin glargine (LANTUS) injection 25 Units  25 Units Subcutaneous Daily Allie Bossier, MD      . methocarbamol (ROBAXIN) 500 mg in dextrose 5 % 50 mL IVPB  500 mg Intravenous Q8H PRN Rachael Fee, PA-C 100 mL/hr at 11/17/20 1207 500 mg at 11/17/20 1207  . metoCLOPramide (REGLAN) tablet 5-10 mg  5-10 mg Oral Q8H PRN Rachael Fee, PA-C  Or  . metoCLOPramide (REGLAN) injection 5-10 mg  5-10 mg Intravenous Q8H PRN Daun Peacock M, PA-C      . morphine 2 MG/ML injection 2 mg  2 mg Intravenous Q4H PRN Peggye Ley, RN   2 mg at 11/18/20 1757  . mupirocin ointment (BACTROBAN) 2 % 1 application  1 application Nasal BID Adefeso, Oladapo, DO   1 application at 11/19/20 0955  . ondansetron (ZOFRAN) injection 4 mg  4 mg Intravenous Q6H PRN Manuela Schwartz, NP   4 mg at 11/14/20 2123  . oxyCODONE (Oxy IR/ROXICODONE) immediate release tablet 5 mg  5 mg Oral Q4H PRN Drema Dallas, MD   5 mg at 11/19/20 1253  . pantoprazole (PROTONIX) EC tablet 20 mg  20 mg Oral Daily Adefeso, Oladapo, DO   20 mg at 11/19/20 0954  . rosuvastatin (CRESTOR) tablet 10 mg  10 mg Oral Daily Adefeso, Oladapo, DO   10 mg at 11/19/20 0954  . senna-docusate (Senokot-S) tablet 1 tablet  1 tablet Oral QHS PRN Rainey Pines, PA-C         Discharge Medications: Please see discharge summary  for a list of discharge medications.  Relevant Imaging Results:  Relevant Lab Results:   Additional Information SS#: [id number]  Carley Hammed, LCSWA

## 2020-11-19 NOTE — Progress Notes (Signed)
12/27 2230 Pt refused assessment of the left hip. Pt refused repositioning. Pt educated and encouraged.

## 2020-11-19 NOTE — Progress Notes (Signed)
PROGRESS NOTE    Margaret [name]  UJW:[id number] DOB: 04/24/1956 DOA: 11/14/2020 PCP: Patient, No Pcp Per     Brief Narrative:  64 y.o. WF PMHx Panic attacks, chronic Diastolic CHF, s/p Mitral Valve repair with Annoloplasty on Eliquis, Endocarditis, COPD, Pulmonary HTN, Obesity ,Subarachnoid bleed, CVA, Subarachnoid bleed DM type II controlled with hyperglycemia, Splenic infarction, CKD stage IIIa  Presents to the emergency department after sustaining fall at home.  Patient has left sided weakness at baseline due to prior stroke and ambulates with a cane.  She states that she slipped and fell while at home, she does not think she hit her head.  Left hip pain was rated as 10/10 on pain scale without any aggravating or alleviating factors she denies headache, dizziness, chest pain, shortness of breath or abdominal pain.  ED Course:  In the emergency department, she was hemodynamically stable.  Work-up in the ED showed leukocytosis, thrombocytosis. CT head without contrast showed no evidence of acute intracranial abnormality CT cervical spine showed no evidence of acute fracture or traumatic malalignment but showed a moderate degenerative disc disease at C6-C7. Left hip x-ray showed intertrochanteric femur fracture on the left with varus angulation at the fracture site.  IV opioid medications were given.  Orthopedic surgery was consulted by ED team.  Hospitalist was asked to admit patient for further evaluation and management   Subjective: 12/28 afebrile overnight A/O x4,, much better spirits today.  Cooperative  Assessment & Plan: Covid vaccination; vaccinated   Principal Problem:   Closed left femoral fracture (HCC) Active Problems:   COPD (chronic obstructive pulmonary disease) (HCC)   Chronic diastolic CHF (congestive heart failure) (HCC)   Morbid obesity (HCC)   Essential hypertension   HLD (hyperlipidemia)   History of CVA with residual deficit   Hyperglycemia due to  diabetes mellitus (HCC)   Fall at home, initial encounter   Acute renal failure superimposed on stage 3a chronic kidney disease (HCC)  LEFT femoral intertrochanteric fracture secondary to mechanical fall -Scheduled for operative repair Sunday, secondary to patient being on Eliquis.Marland Kitchen -LEFT hip x-ray; showed intertrochanteric femur fracture on the left with varus angulation at the fracture site.  -Pain medication per orthopedic surgery.  -Fall precautions -Neuro checks -12/26 LEFT Hip nail -12/28 PT recommends SNF  Chronic diastolic CHF -Bisoprolol 5 mg daily -Bumex 1 mg daily -Strict I&O -3.2 L -Daily weight Filed Weights   11/17/20 0500 11/18/20 0500 11/19/20 0500  Weight: 127.6 kg 128.4 kg 126.8 kg    Essential HTN -See CHF  Mitral valve repair -On Eliquis (hold ) secondary to upcoming surgery -11/28 check with surgery about restarting Eliquis  COPD -Per patient not on home O2 or CPAP -Albuterol PRN  Hx of CVA -Hx residual left-sided weakness  DM type II uncontrolled with hyperglycemia -12/23 hemoglobin A1c= 8.5 -12/27 Lantus 8 units daily -Moderate SSI  Acute on CKD stage IIIa(baseline Cr appears to be~1.19) Lab Results  Component Value Date   CREATININE 1.29 (H) 11/19/2020   CREATININE 1.16 (H) 11/18/2020   CREATININE 1.32 (H) 11/17/2020   CREATININE 1.34 (H) 11/16/2020   CREATININE 1.24 (H) 11/15/2020  -12/28 gentleman hydration normal saline 39ml/hr -12/28 DC Bumex 1 mg daily  Leukocytosis -Most likely reactive given patient has negative fever, negative bands, negative left shift  Thrombocytosis -Resolved  HLD -12/25 LDL = 54  -Crestor 10 mg daily  Morbid obesity -Post surgery will benefit from nutritional consult for lifestyle modification    DVT prophylaxis: Eliquis (on  hold) for Sunday surgery Code Status: Full Family Communication:  Status is: Inpatient    Dispo: The patient is from: Home              Anticipated d/c is to: SNF               Anticipated d/c date is: 12/30              Patient currently unstable      Consultants:  Orthopedic surgery   Procedures/Significant Events:  12/26 LEFT hip INTRAMEDULLARY (IM) NAIL INTERTROCHANTRIC   I have personally reviewed and interpreted all radiology studies and my findings are as above.  VENTILATOR SETTINGS: Room air 12/28 SPO2 95%   Cultures   Antimicrobials: Anti-infectives (From admission, onward)   Start     Ordered Stop   11/17/20 1500  ceFAZolin (ANCEF) IVPB 2g/100 mL premix        11/17/20 1323 11/17/20 2155   11/17/20 1200  ceFAZolin (ANCEF) IVPB 2g/100 mL premix  Status:  Discontinued        11/17/20 1102 11/17/20 1323   11/17/20 0800  ceFAZolin (ANCEF) 3 g in dextrose 5 % 50 mL IVPB        12 /25/21 1908 11/17/20 0936       Devices    LINES / TUBES:      Continuous Infusions: . methocarbamol (ROBAXIN) IV 500 mg (11/17/20 1207)     Objective: Vitals:   11/18/20 2013 11/19/20 0242 11/19/20 0500 11/19/20 0712  BP: 123/69 124/60  123/70  Pulse: 83 71  70  Resp: 16 15  16   Temp: 98 F (36.7 C) 98.2 F (36.8 C)  98.3 F (36.8 C)  TempSrc: Oral Oral  Oral  SpO2: 93% 95%  97%  Weight:   126.8 kg   Height:        Intake/Output Summary (Last 24 hours) at 11/19/2020 1029 Last data filed at 11/19/2020 0900 Gross per 24 hour  Intake 720 ml  Output 2800 ml  Net -2080 ml   Filed Weights   11/17/20 0500 11/18/20 0500 11/19/20 0500  Weight: 127.6 kg 128.4 kg 126.8 kg   Physical Exam:  General: A/O x4,, No acute respiratory distress Eyes: negative scleral hemorrhage, negative anisocoria, negative icterus ENT: Negative Runny nose, negative gingival bleeding, Neck:  Negative scars, masses, torticollis, lymphadenopathy, JVD Lungs: Clear to auscultation bilaterally without wheezes or crackles Cardiovascular: Regular rate and rhythm without murmur gallop or rub normal S1 and S2 Abdomen: MORBIDLY OBESE, negative abdominal  pain, nondistended, positive soft, bowel sounds, no rebound, no ascites, no appreciable mass Extremities:  left lower extremity very tender to movement.  Left upper femur surgical dressing in place.  DP/TP pulse 2+ left lower extremity Skin: Negative rashes, lesions, ulcers Psychiatric:  Negative depression, negative anxiety, negative fatigue, negative mania  Central nervous system:  Cranial nerves II through XII intact, tongue/uvula midline, all extremities muscle strength 5/5, sensation intact throughout, negative dysarthria, negative expressive aphasia, negative receptive aphasia.   Data Reviewed: Care during the described time interval was provided by me .  I have reviewed this patient's available data, including medical history, events of note, physical examination, and all test results as part of my evaluation.  CBC: Recent Labs  Lab 11/14/20 1444 11/15/20 0304 11/16/20 0138 11/17/20 0056 11/18/20 0251 11/19/20 0145  WBC 14.4* 15.2* 13.5* 14.4* 14.8* 13.6*  NEUTROABS 11.8*  --  10.6* 11.3* 12.3* 9.1*  HGB 14.8 14.6 14.2 13.4 12.9 13.2  HCT 48.3* 46.5* 45.5 43.8 39.6 43.6  MCV 88.8 89.1 89.0 88.0 86.5 89.2  PLT 417* 412* 372 352 332 399   Basic Metabolic Panel: Recent Labs  Lab 11/15/20 0304 11/16/20 0138 11/17/20 0056 11/18/20 0251 11/19/20 0145  NA 137 132* 128* 133* 134*  K 4.3 4.2 3.9 4.6 4.8  CL 96* 94* 92* 96* 93*  CO2 33* 30 27 27  33*  GLUCOSE 157* 151* 328* 253* 201*  BUN 15 18 21 20  25*  CREATININE 1.24* 1.34* 1.32* 1.16* 1.29*  CALCIUM 9.8 9.6 9.3 9.9 10.4*  MG 1.8 1.8 1.6* 1.9 2.1  PHOS 4.1 3.5 2.1* 2.2* 2.4*   GFR: Estimated Creatinine Clearance: 58.1 mL/min (A) (by C-G formula based on SCr of 1.29 mg/dL (H)). Liver Function Tests: Recent Labs  Lab 11/15/20 0304 11/16/20 0138 11/17/20 0056 11/18/20 0251 11/19/20 0145  AST 23 14* 13* 12* 14*  ALT 22 16 13 12 12   ALKPHOS 91 83 74 80 80  BILITOT 1.0 1.3* 1.1 0.7 0.3  PROT 7.1 7.0 6.3* 6.6 6.8   ALBUMIN 3.0* 2.8* 2.4* 2.2* 2.4*   No results for input(s): LIPASE, AMYLASE in the last 168 hours. No results for input(s): AMMONIA in the last 168 hours. Coagulation Profile: Recent Labs  Lab 11/14/20 1703 11/15/20 0304  INR 1.2 1.1   Cardiac Enzymes: No results for input(s): CKTOTAL, CKMB, CKMBINDEX, TROPONINI in the last 168 hours. BNP (last 3 results) No results for input(s): PROBNP in the last 8760 hours. HbA1C: No results for input(s): HGBA1C in the last 72 hours. CBG: Recent Labs  Lab 11/18/20 0626 11/18/20 1130 11/18/20 1624 11/18/20 2014 11/19/20 0627  GLUCAP 206* 186* 187* 327* 183*   Lipid Profile: No results for input(s): CHOL, HDL, LDLCALC, TRIG, CHOLHDL, LDLDIRECT in the last 72 hours. Thyroid Function Tests: No results for input(s): TSH, T4TOTAL, FREET4, T3FREE, THYROIDAB in the last 72 hours. Anemia Panel: No results for input(s): VITAMINB12, FOLATE, FERRITIN, TIBC, IRON, RETICCTPCT in the last 72 hours. Sepsis Labs: No results for input(s): PROCALCITON, LATICACIDVEN in the last 168 hours.  Recent Results (from the past 240 hour(s))  Resp Panel by RT-PCR (Flu A&B, Covid) Nasopharyngeal Swab     Status: None   Collection Time: 11/14/20  2:44 PM   Specimen: Nasopharyngeal Swab; Nasopharyngeal(NP) swabs in vial transport medium  Result Value Ref Range Status   SARS Coronavirus 2 by RT PCR NEGATIVE NEGATIVE Final    Comment: (NOTE) SARS-CoV-2 target nucleic acids are NOT DETECTED.  The SARS-CoV-2 RNA is generally detectable in upper respiratory specimens during the acute phase of infection. The lowest concentration of SARS-CoV-2 viral copies this assay can detect is 138 copies/mL. A negative result does not preclude SARS-Cov-2 infection and should not be used as the sole basis for treatment or other patient management decisions. A negative result may occur with  improper specimen collection/handling, submission of specimen other than nasopharyngeal  swab, presence of viral mutation(s) within the areas targeted by this assay, and inadequate number of viral copies(<138 copies/mL). A negative result must be combined with clinical observations, patient history, and epidemiological information. The expected result is Negative.  Fact Sheet for Patients:  BloggerCourse.com  Fact Sheet for Healthcare Providers:  SeriousBroker.it  This test is no t yet approved or cleared by the [place] FDA and  has been authorized for detection and/or diagnosis of SARS-CoV-2 by FDA under an Emergency Use Authorization (EUA). This EUA will remain  in effect (meaning this test can  be used) for the duration of the COVID-19 declaration under Section 564(b)(1) of the Act, 21 U.S.C.section 360bbb-3(b)(1), unless the authorization is terminated  or revoked sooner.       Influenza A by PCR NEGATIVE NEGATIVE Final   Influenza B by PCR NEGATIVE NEGATIVE Final    Comment: (NOTE) The Xpert Xpress SARS-CoV-2/FLU/RSV plus assay is intended as an aid in the diagnosis of influenza from Nasopharyngeal swab specimens and should not be used as a sole basis for treatment. Nasal washings and aspirates are unacceptable for Xpert Xpress SARS-CoV-2/FLU/RSV testing.  Fact Sheet for Patients: BloggerCourse.com  Fact Sheet for Healthcare Providers: SeriousBroker.it  This test is not yet approved or cleared by the [place] FDA and has been authorized for detection and/or diagnosis of SARS-CoV-2 by FDA under an Emergency Use Authorization (EUA). This EUA will remain in effect (meaning this test can be used) for the duration of the COVID-19 declaration under Section 564(b)(1) of the Act, 21 U.S.C. section 360bbb-3(b)(1), unless the authorization is terminated or revoked.  Performed at [facility], [address], [place], [place] [address]   Surgical PCR  screen     Status: Abnormal   Collection Time: 11/15/20  5:37 AM   Specimen: Nasal Mucosa; Nasal Swab  Result Value Ref Range Status   MRSA, PCR POSITIVE (A) NEGATIVE Final    Comment: RESULT CALLED TO, READ BACK BY AND VERIFIED WITH: Redmond Baseman RN 9:45 11/15/20 ([email or web address])    Staphylococcus aureus POSITIVE (A) NEGATIVE Final    Comment: (NOTE) The Xpert SA Assay (FDA approved for NASAL specimens in patients 58 years of age and older), is one component of a comprehensive surveillance program. It is not intended to diagnose infection nor to guide or monitor treatment. Performed at [facility], [address], [place], [place] [address]          Radiology Studies: No results found.      Scheduled Meds: . apixaban  2.5 mg Oral BID  . bisoprolol  5 mg Oral Daily  . bumetanide  1 mg Oral Daily  . docusate sodium  100 mg Oral BID  . insulin aspart  0-15 Units Subcutaneous TID WC  . insulin aspart  0-5 Units Subcutaneous QHS  . insulin glargine  8 Units Subcutaneous Daily  . mupirocin ointment  1 application Nasal BID  . pantoprazole  20 mg Oral Daily  . rosuvastatin  10 mg Oral Daily   Continuous Infusions: . methocarbamol (ROBAXIN) IV 500 mg (11/17/20 1207)     LOS: 5 days    Time spent:0 min    Yianni Skilling, Roselind Messier, MD [facility] Pager [phone]  If 7PM-7AM, please contact night-coverage [email or web address] Password [facility] 11/19/2020, 10:29 AM

## 2020-11-19 NOTE — Progress Notes (Signed)
Inpatient Diabetes Program Recommendations  AACE/ADA: New Consensus Statement on Inpatient Glycemic Control (2015)  Target Ranges:  Prepandial:   less than 140 mg/dL      Peak postprandial:   less than 180 mg/dL (1-2 hours)      Critically ill patients:  140 - 180 mg/dL   Lab Results  Component Value Date   GLUCAP 237 (H) 11/19/2020   HGBA1C 8.5 (H) 11/14/2020    Review of Glycemic Control  Results for SHAMYA, MACFADDEN (MRN [id number]) as of 11/19/2020 12:49  Ref. Range 11/18/2020 16:24 11/18/2020 20:14 11/19/2020 06:27 11/19/2020 11:15  Glucose-Capillary Latest Ref Range: 70 - 99 mg/dL 034 (H) 961 (H) 164 (H) 237 (H)   Diabetes history: DM 2 Outpatient Diabetes medications:  Xultophy 50 units daily Current orders for Inpatient glycemic control:  Novolog moderate tid with meals and HS Lantus 8 units daily Inpatient Diabetes Program Recommendations:   Please consider increasing Lantus to 25 units daily.    Thanks,  Beryl Meager, RN, BC-ADM Inpatient Diabetes Coordinator Pager [phone] (8a-5p)

## 2020-11-19 NOTE — Progress Notes (Signed)
Physical Therapy Treatment Patient Details Name: Margaret [name] MRN: [id number] DOB: Oct 18, 1956 Today's Date: 11/19/2020    History of Present Illness Margaret [name] is a 64 y.o. female with past medical history significant for endocarditis, CHF, COPD, obesity, pulmonary hypertension, valve repair, subarachnoid bleed, CVA, diabetes who presents for evaluation after fall. She had a left sided weakness after prior stroke. She just discontinued using a wheelchair in August of this year. She normally ambulates with a cane. She fell yesterday and had immediate pain in her left hip with inability to bear weight. Orthopedics was consulted for left femur fracture and underwent IM nailing on 12/26    PT Comments    Patient progressing slowly towards PT goals. Continues to have anticipatory pain and anxious about all mobility requiring increased time/cues to perform tasks. Requires Max A of 2 for bed mobility with increased time. Able to laterally scoot along side bed with max A of 2 and use of pads with limited help. Pt holds breath during session when moving but does well when encouraged to take deep breaths. Pt's bandage bleeding, RN notified. Reviewed there ex. Continue to recommend SNF. Will follow.   Follow Up Recommendations  SNF;Supervision/Assistance - 24 hour     Equipment Recommendations  None recommended by PT    Recommendations for Other Services       Precautions / Restrictions Precautions Precautions: Fall;Other (comment) Precaution Comments: L sided hemiparesis from previous CVA Restrictions Weight Bearing Restrictions: Yes LLE Weight Bearing: Weight bearing as tolerated    Mobility  Bed Mobility Overal bed mobility: Needs Assistance Bed Mobility: Supine to Sit;Sit to Supine     Supine to sit: Max assist;+2 for physical assistance;HOB elevated Sit to supine: Max assist;+2 for physical assistance;HOB elevated   General bed mobility comments: Step by step cues  for technique, increased time to perform any movement with multiple rest breaks needed between bouts; assist with supporting LLE, trunk and scooting bottom to EOB. Able to return to bed using helicoptor technique and pad.  Transfers Overall transfer level: Needs assistance Equipment used: 2 person hand held assist Transfers: Lateral/Scoot Transfers          Lateral/Scoot Transfers: Max assist;+2 physical assistance General transfer comment: Able to laterally scoot along side bed with max A of 2 using pad and assist with anterior weight shift. Limited assist from pt.  Ambulation/Gait                 Stairs             Wheelchair Mobility    Modified Rankin (Stroke Patients Only)       Balance Overall balance assessment: Needs assistance Sitting-balance support: Feet supported;No upper extremity supported Sitting balance-Leahy Scale: Good Sitting balance - Comments: supervision for safety.                                    Cognition Arousal/Alertness: Awake/alert Behavior During Therapy: Anxious Overall Cognitive Status: Impaired/Different from baseline Area of Impairment: Safety/judgement;Awareness                         Safety/Judgement: Decreased awareness of deficits;Decreased awareness of safety Awareness: Emergent   General Comments: Pt fearful of anticipatory pain during all mobility, Able to redirect today explaining step by step what is going to happen.      Exercises General Exercises - Lower Extremity  Ankle Circles/Pumps: AROM;Both;10 reps;Supine Quad Sets: AROM;Both;10 reps;Supine Hip ABduction/ADduction: AROM;Left;5 reps;Supine    General Comments General comments (skin integrity, edema, etc.): Pt on supplemental 02, not a great wave form so not able to get a good 02 reading; holding breathing throughout session when moving and in pain. Responded well to cues for deep, slow breaths. Pt's bandage weeping with a good  amount of blood, RN notified.      Pertinent Vitals/Pain Pain Assessment: Faces Faces Pain Scale: Hurts worst Pain Location: Lft hip with movement Pain Descriptors / Indicators: Grimacing;Operative site guarding;Moaning;Sharp;Shooting Pain Intervention(s): Monitored during session;Repositioned;Premedicated before session;Limited activity within patient's tolerance;Patient requesting pain meds-RN notified    Home Living                      Prior Function            PT Goals (current goals can now be found in the care plan section) Progress towards PT goals: Progressing toward goals (slowly)    Frequency    Min 3X/week      PT Plan Current plan remains appropriate    Co-evaluation              AM-PAC PT "6 Clicks" Mobility   Outcome Measure  Help needed turning from your back to your side while in a flat bed without using bedrails?: Total Help needed moving from lying on your back to sitting on the side of a flat bed without using bedrails?: Total Help needed moving to and from a bed to a chair (including a wheelchair)?: Total Help needed standing up from a chair using your arms (e.g., wheelchair or bedside chair)?: Total Help needed to walk in hospital room?: Total Help needed climbing 3-5 steps with a railing? : Total 6 Click Score: 6    End of Session Equipment Utilized During Treatment: Oxygen Activity Tolerance: Patient limited by pain;Patient tolerated treatment well Patient left: in bed;with call bell/phone within reach;with bed alarm set Nurse Communication: Mobility status;Other (comment) (bandage bleeding) PT Visit Diagnosis: Other abnormalities of gait and mobility (R26.89);Muscle weakness (generalized) (M62.81);Difficulty in walking, not elsewhere classified (R26.2);Pain;Hemiplegia and hemiparesis Hemiplegia - Right/Left: Left Hemiplegia - dominant/non-dominant: Non-dominant Pain - Right/Left: Left Pain - part of body: Hip;Leg     Time:  [id number] PT Time Calculation (min) (ACUTE ONLY): 38 min  Charges:  $Therapeutic Exercise: 8-22 mins $Therapeutic Activity: 23-37 mins                     Marisa Severin, PT, DPT Acute Rehabilitation Services Pager [phone] Office [phone]       Margaret [name] 11/19/2020, 10:29 AM

## 2020-11-19 NOTE — TOC Initial Note (Addendum)
Transition of Care [facility]) - Initial/Assessment Note    Patient Details  Name: Margaret [name] MRN: [id number] Date of Birth: 10-30-1956  Transition of Care [facility]) CM/SW Contact:    Emeterio Reeve, [place] Phone Number: 11/19/2020, 2:05 PM  Clinical Narrative:                  CSW met with pt at bedside. CSW introduced self and explained her role at the hospital.  Pt states she was living at home with her husband PTA. Pt reports she used a can to help with mobility and was able to preform all ADL's on her own.   CSW reviewed PT/ot reccs with pt. Pt stated she will go to SNF if she has to. Pt reports she has been to [facility] in the past and does not want to return pt reports she has a preference for [facility].   CSW reached out to [facility] about SNF options. They are contracted with [facility], [facility] in [place]. Pts social worker is Shanon Brow, [phone], please contact with pt chooses a bed. PT has been faxed to the 4 contracted facilities.   TOC will follow.   Expected Discharge Plan: Skilled Nursing Facility Barriers to Discharge: SNF Pending bed offer,Continued Medical Work up   Patient Goals and CMS Choice Patient states their goals for this hospitalization and ongoing recovery are:: to get better and return home CMS Medicare.gov Compare Post Acute Care list provided to:: Patient Choice offered to / list presented to : Patient  Expected Discharge Plan and Services Expected Discharge Plan: [place]       Living arrangements for the past 2 months: Single Family Home                                      Prior Living Arrangements/Services Living arrangements for the past 2 months: Single Family Home Lives with:: Spouse Patient language and need for interpreter reviewed:: Yes        Need for Family Participation in Patient Care: Yes (Comment) Care giver support system  in place?: Yes (comment) Current home services: DME Criminal Activity/Legal Involvement Pertinent to Current Situation/Hospitalization: No - Comment as needed  Activities of Daily Living Home Assistive Devices/Equipment: Cane (specify quad or straight) ADL Screening (condition at time of admission) Patient's cognitive ability adequate to safely complete daily activities?: Yes Is the patient deaf or have difficulty hearing?: No Does the patient have difficulty seeing, even when wearing glasses/contacts?: No Does the patient have difficulty concentrating, remembering, or making decisions?: No Patient able to express need for assistance with ADLs?: Yes Does the patient have difficulty dressing or bathing?: No Independently performs ADLs?: Yes (appropriate for developmental age) Does the patient have difficulty walking or climbing stairs?: No Weakness of Legs: Both Weakness of Arms/Hands: Left  Permission Sought/Granted   Permission granted to share information with : Yes, Verbal Permission Granted     Permission granted to share info w AGENCY: SNF        Emotional Assessment Appearance:: Appears stated age Attitude/Demeanor/Rapport: Engaged Affect (typically observed): Accepting,Appropriate Orientation: : Oriented to Self,Oriented to Place,Oriented to  Time,Oriented to Situation Alcohol / Substance Use: Not Applicable Psych Involvement: No (comment)  Admission diagnosis:  Closed left femoral fracture ([place]) [S72.92XA] Closed displaced intertrochanteric fracture of left femur, initial encounter ([place]) [S72.142A] Fall, initial  encounter [id number]] Patient Active Problem List   Diagnosis Date Noted  . Acute renal failure superimposed on stage 3a chronic kidney disease ([place]) 11/18/2020  . Closed left femoral fracture ([place]) 11/14/2020  . Hyperglycemia due to diabetes mellitus ([place]) 11/14/2020  . Fall at home, initial encounter 11/14/2020  . Left shoulder pain 06/29/2016  . Encounter  for therapeutic drug monitoring 06/17/2016  . Spastic hemiplegia affecting left nondominant side ([place]) 06/12/2016  . Generalized anxiety disorder 06/12/2016  . Muscle spasticity   . Adjustment disorder with anxious mood   . Subtherapeutic anticoagulation   . PAF (paroxysmal atrial fibrillation) ([place])   . Hemiplegia of nondominant side, late effect of cerebrovascular disease ([place])   . 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 Hemiparesis and dysphasia as late effect of cerebrovascular accident (CVA) ([place])   . Flaccid hemiplegia affecting left nondominant side ([place])   . Interstitial edema   . Type 2 diabetes mellitus with peripheral neuropathy (HCC)   . Debilitated 05/12/2016  . Left hemiparesis ([place]) 05/12/2016  . History of subarachnoid hemorrhage   . SOB (shortness of breath)   . History of CVA with residual deficit   . Dysarthria, post-stroke   . Chronic obstructive pulmonary disease ([place])   . Tachypnea   . Bradycardia   . Hypokalemia   . Leukocytosis   . SIRS (systemic inflammatory response syndrome) (HCC)   . Acute blood loss anemia   . Thrombocytosis   . S/P mitral valve repair 04/30/2016  . 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 Acute on chronic diastolic congestive heart failure due to valvular disease ([place]) 04/14/2016  . CHF due to valvular disease, acute on chronic, diastolic 62/56/3893  . Chronic diastolic congestive heart failure due to valvular disease ([place]) 04/13/2016  . History of stroke 04/13/2016  . Cerebrovascular accident (CVA) due to occlusion of right carotid artery ([place]) 03/25/2016  . Splenic infarct 03/25/2016  . Type 2 diabetes mellitus with circulatory disorder ([place]) 03/25/2016  . Mitral regurgitation 03/06/2016  . HLD (hyperlipidemia) 03/06/2016  . Right carotid artery occlusion   . Epigastric abdominal tenderness on direct palpation   .  Endocarditis of mitral valve   . Positive ANA (antinuclear antibody)   . Streptococcus viridans infection 02/04/2016  . Cerebrovascular accident (CVA) due to embolism of cerebral artery ([place])   . Subarachnoid hemorrhage ([place]) 02/01/2016  . Stroke ([place])   . Visual changes   . Pulmonary infiltrates 01/23/2016  . Pulmonary hypertension assoc with unclear multi-factorial mechanisms ([place]) 01/23/2016  . 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 Insulin dependent diabetes mellitus 01/14/2016  . Essential hypertension 01/14/2016  . Hepatic cirrhosis ([place]) 01/14/2016  . COPD (chronic obstructive pulmonary disease) ([place]) 04/27/2012  . Chronic diastolic CHF (congestive heart failure) ([place]) 04/27/2012  . Morbid obesity ([place]) 04/27/2012  . Hypoxia 04/27/2012  . Dyspnea on exertion 04/27/2012  . Smoker 04/27/2012   PCP:  Patient, No Pcp Per Pharmacy:   [facility] [address] ([address] [place] [facility] [place] [address] [place] ([place]) [place] [address] Phone: [phone] Fax: [phone]     Social Determinants of Health (SDOH) Interventions    Readmission Risk Interventions No flowsheet data found.  Emeterio Reeve, Latanya Presser, [place] Social Worker [phone]

## 2020-11-19 NOTE — Plan of Care (Signed)
  Problem: Education: Goal: Knowledge of General Education information will improve Description: Including pain rating scale, medication(s)/side effects and non-pharmacologic comfort measures Outcome: Progressing   Problem: Clinical Measurements: Goal: Will remain free from infection Outcome: Progressing   Problem: Activity: Goal: Risk for activity intolerance will decrease Outcome: Progressing   Problem: Coping: Goal: Level of anxiety will decrease Outcome: Progressing   Problem: Pain Managment: Goal: General experience of comfort will improve Outcome: Progressing   

## 2020-11-20 DIAGNOSIS — N179 Acute kidney failure, unspecified: Secondary | ICD-10-CM | POA: Diagnosis not present

## 2020-11-20 DIAGNOSIS — J431 Panlobular emphysema: Secondary | ICD-10-CM | POA: Diagnosis not present

## 2020-11-20 DIAGNOSIS — N1831 Chronic kidney disease, stage 3a: Secondary | ICD-10-CM | POA: Diagnosis not present

## 2020-11-20 DIAGNOSIS — I10 Essential (primary) hypertension: Secondary | ICD-10-CM | POA: Diagnosis not present

## 2020-11-20 LAB — COMPREHENSIVE METABOLIC PANEL
ALT: 13 U/L (ref 0–44)
AST: 15 U/L (ref 15–41)
Albumin: 2.3 g/dL — ABNORMAL LOW (ref 3.5–5.0)
Alkaline Phosphatase: 79 U/L (ref 38–126)
Anion gap: 8 (ref 5–15)
BUN: 28 mg/dL — ABNORMAL HIGH (ref 8–23)
CO2: 29 mmol/L (ref 22–32)
Calcium: 9.7 mg/dL (ref 8.9–10.3)
Chloride: 96 mmol/L — ABNORMAL LOW (ref 98–111)
Creatinine, Ser: 1.22 mg/dL — ABNORMAL HIGH (ref 0.44–1.00)
GFR, Estimated: 50 mL/min — ABNORMAL LOW (ref >60)
Glucose, Bld: 277 mg/dL — ABNORMAL HIGH (ref 70–99)
Potassium: 4.2 mmol/L (ref 3.5–5.1)
Sodium: 133 mmol/L — ABNORMAL LOW (ref 135–145)
Total Bilirubin: 0.3 mg/dL (ref 0.3–1.2)
Total Protein: 6.3 g/dL — ABNORMAL LOW (ref 6.5–8.1)

## 2020-11-20 LAB — CBC WITH DIFFERENTIAL/PLATELET
Abs Immature Granulocytes: 0.04 10*3/uL (ref 0.00–0.07)
Basophils Absolute: 0.1 10*3/uL (ref 0.0–0.1)
Basophils Relative: 1 %
Eosinophils Absolute: 0.5 10*3/uL (ref 0.0–0.5)
Eosinophils Relative: 4 %
HCT: 39.5 % (ref 36.0–46.0)
Hemoglobin: 12.6 g/dL (ref 12.0–15.0)
Immature Granulocytes: 0 %
Lymphocytes Relative: 25 %
Lymphs Abs: 2.9 10*3/uL (ref 0.7–4.0)
MCH: 27.8 pg (ref 26.0–34.0)
MCHC: 31.9 g/dL (ref 30.0–36.0)
MCV: 87 fL (ref 80.0–100.0)
Monocytes Absolute: 1.1 10*3/uL — ABNORMAL HIGH (ref 0.1–1.0)
Monocytes Relative: 9 %
Neutro Abs: 7 10*3/uL (ref 1.7–7.7)
Neutrophils Relative %: 61 %
Platelets: 363 10*3/uL (ref 150–400)
RBC: 4.54 MIL/uL (ref 3.87–5.11)
RDW: 15.9 % — ABNORMAL HIGH (ref 11.5–15.5)
WBC: 11.6 10*3/uL — ABNORMAL HIGH (ref 4.0–10.5)
nRBC: 0 % (ref 0.0–0.2)

## 2020-11-20 LAB — PHOSPHORUS: Phosphorus: 3 mg/dL (ref 2.5–4.6)

## 2020-11-20 LAB — GLUCOSE, CAPILLARY
Glucose-Capillary: 204 mg/dL — ABNORMAL HIGH (ref 70–99)
Glucose-Capillary: 228 mg/dL — ABNORMAL HIGH (ref 70–99)
Glucose-Capillary: 186 mg/dL — ABNORMAL HIGH (ref 70–99)
Glucose-Capillary: 181 mg/dL — ABNORMAL HIGH (ref 70–99)

## 2020-11-20 LAB — MAGNESIUM: Magnesium: 1.9 mg/dL (ref 1.7–2.4)

## 2020-11-20 NOTE — TOC Progression Note (Signed)
Transition of Care [facility]) - Progression Note    Patient Details  Name: Margaret [name] MRN: [id number] Date of Birth: 1956/01/09  [facility]) CM/SW Contact  Jimmy Picket, [place] Phone Number: 11/20/2020, 4:49 PM  Clinical Narrative:     CSW called and requested [facility] [place] review pt for snf  Expected Discharge Plan: Skilled Nursing Facility Barriers to Discharge: SNF Pending bed offer,Continued Medical Work up  Expected Discharge Plan and Services Expected Discharge Plan: Skilled Nursing Facility       Living arrangements for the past 2 months: Single Family Home                                       Social Determinants of Health (SDOH) Interventions    Readmission Risk Interventions No flowsheet data found.  Jimmy Picket, Theresia Majors, [place] Clinical Social Worker [phone]

## 2020-11-20 NOTE — Plan of Care (Signed)

## 2020-11-20 NOTE — Progress Notes (Signed)
Subjective: Patient reports pain as much better controlled today. She is in better spirits and even smiling and laughing. Last night I changed her Tylenol from PRN to q6h. She says she has pain only with movement but has been trying to breathe and push her way through it. PT/OT has been able to make some progress with her sitting on the EOB. Tolerating diet but still hasn't had a BM. Feels gassy. Denies CP, SOB, weakness/dizziness. Has not been able to mobilize OOB but PT will likely try that today. She is eager to get better.   Objective:   VITALS:   Vitals:   11/19/20 1932 11/20/20 0314 11/20/20 0500 11/20/20 0818  BP: 114/84 (!) 162/54  (!) 128/56  Pulse: 74 65  69  Resp: 16 16  18   Temp: (!) 97.5 F (36.4 C) (!) 97.4 F (36.3 C)  97.6 F (36.4 C)  TempSrc: Oral Oral  Oral  SpO2: 96% 100%  96%  Weight:   126.2 kg   Height:       CBC Latest Ref Rng & Units 11/20/2020 11/19/2020 11/18/2020  WBC 4.0 - 10.5 K/uL 11.6(H) 13.6(H) 14.8(H)  Hemoglobin 12.0 - 15.0 g/dL 11/20/2020 24.2 68.3  Hematocrit 36.0 - 46.0 % 39.5 43.6 39.6  Platelets 150 - 400 K/uL 363 399 332   BMP Latest Ref Rng & Units 11/20/2020 11/19/2020 11/18/2020  Glucose 70 - 99 mg/dL 11/20/2020) 622(W) 979(G)  BUN 8 - 23 mg/dL 921(J) 94(R) 20  Creatinine 0.44 - 1.00 mg/dL 74(Y) 8.14(G) 8.18(H)  Sodium 135 - 145 mmol/L 133(L) 134(L) 133(L)  Potassium 3.5 - 5.1 mmol/L 4.2 4.8 4.6  Chloride 98 - 111 mmol/L 96(L) 93(L) 96(L)  CO2 22 - 32 mmol/L 29 33(H) 27  Calcium 8.9 - 10.3 mg/dL 9.7 10.4(H) 9.9   Intake/Output      12/28 0701 12/29 0700 12/29 0701 12/30 0700   P.O. 1080 240   I.V. (mL/kg) 94.5 (0.7)    Total Intake(mL/kg) 1174.5 (9.3) 240 (1.9)   Urine (mL/kg/hr) 2100 (0.7)    Total Output 2100    Net -925.5 +240        Urine Occurrence 1 x        Physical Exam: General: NAD. Laying in bed. Easily awoken. Much more calm and clear headed this morning. Very conversant. Eager to get better. MSK Neurovascularly  intact Sensation intact distally Intact pulses distally Dorsiflexion/Plantar flexion intact Incision: covered with dressing that is C/D/I, some eechymosis seen around dressings that is to be expected postop Ecchymosis on left shin from original fall that is TTP   Assessment: 3 Days Post-Op  S/P Procedure(s) (LRB): INTRAMEDULLARY (IM) NAIL INTERTROCHANTRIC (Left) by Dr. 1/31. Jewel Baize on 11/17/20  Principal Problem:   Closed left femoral fracture (HCC) Active Problems:   COPD (chronic obstructive pulmonary disease) (HCC)   Chronic diastolic CHF (congestive heart failure) (HCC)   Morbid obesity (HCC)   Essential hypertension   HLD (hyperlipidemia)   History of CVA with residual deficit   Hyperglycemia due to diabetes mellitus (HCC)   Fall at home, initial encounter   Acute renal failure superimposed on stage 3a chronic kidney disease (HCC)   Closed left intertrochanteric femur fracture, status post IM nail Doing ok postop day 3 Tolerating diet and voiding, no BM yet, feels gassy Pain well controlled now Not yet mobilized OOB but has gotten to EOB  Plan: Continue to work with therapy to get OOB and moving. Happy with the improved  pain control.  Ordered Left femur xray yesterday to check for any changes due to her continued severe pain but no changes were seen.  Continue to use Incentive Spirometry Apply ice to area PRN  Weightbearing: WBAT LLE Insicional and dressing care: Dressings left intact until follow-up, can change if bloody or falling off Showering: Keep dressing dry VTE prophylaxis: Eliquis, SCDs, ambulation Pain control: Continue current regimen Contact information:  Edmonia Lynch MD, Aggie Moats PA-C  Dispo: Likely going to a SNF. Social work arranging placement. Therapy evaluations for today are pending.  Discharge when mobilized and ready medically. Hopefully within the next 24 hours.   Britt Bottom, PA-C 11/20/2020, 8:35 AM

## 2020-11-20 NOTE — Progress Notes (Signed)
Margaret [name]  FTD:[id number] DOB: 07-17-1956 DOA: 11/14/2020 PCP: Patient, No Pcp Per    Brief Narrative:  64 year old with a history of panic attacks, chronic diastolic CHF, mitral valve repair with annuloplasty on chronic Eliquis, endocarditis, COPD, pulmonary hypertension, subarachnoid hemorrhage, CVA, DM2 with hyperglycemia, splenic infarction, CKD stage IIIa, and obesity who presented to the ED after a mechanical fall at home.  After her fall she began to experience significant left hip pain and presented to the ED for evaluation.  X-rays noted an intertrochanteric femur fracture on the left in the ED but fortunately no other acute findings were appreciated.  Significant Events:  12/23 admit via [facility] with left hip fracture 12/26 operative repair with left hip nail  Antimicrobials:  None  DVT prophylaxis: Eliquis  Subjective: Vital signs stable.  Afebrile.  Saturation 96%.  No new complaints today.  Tells me she would much prefer discharge home as opposed to a rehab facility.  Assessment & Plan:  Left intertrochanteric femur fracture after mechanical fall Care per orthopedics -status post left hip IM nail - working w/ PT/OT who thus far recommend SNF   Chronic diastolic CHF Continue beta-blocker and diuretic -well compensated presently  HTN Blood pressure controlled  Prior mitral valve repair Eliquis resumed  COPD Does not require home O2 -well compensated  Prior CVA with left-sided residual weakness For SNF or rehab  Uncontrolled DM2 with hyperglycemia A1c 8.5 - adjust tx again today as CBG not yet at goal   Acute kidney injury on CKD stage IIIa Baseline creatinine approximately 1.2 - now approaching baseline   HLD Continue usual Crestor  Morbid obesity - Body mass index is 47.73 kg/m.    Code Status: FULL CODE Family Communication: No family present at time of exam today Status is: Inpatient  Remains inpatient appropriate because:Unsafe d/c  plan   Dispo: The patient is from: Home              Anticipated d/c is to: SNF              Anticipated d/c date is: 1 day              Patient currently is not medically stable to d/c.   Consultants:  Ortho  Objective: Blood pressure (!) 128/56, pulse 69, temperature 97.6 F (36.4 C), temperature source Oral, resp. rate 18, height 5' 4.02" (1.626 m), weight 126.2 kg, SpO2 96 %.  Intake/Output Summary (Last 24 hours) at 11/20/2020 1004 Last data filed at 11/20/2020 0819 Gross per 24 hour  Intake 1054.5 ml  Output 1800 ml  Net -745.5 ml   Filed Weights   11/18/20 0500 11/19/20 0500 11/20/20 0500  Weight: 128.4 kg 126.8 kg 126.2 kg    Examination: General: No acute respiratory distress Lungs: Clear to auscultation bilaterally without wheezes or crackles Cardiovascular: Regular rate and rhythm without murmur gallop or rub normal S1 and S2 Abdomen: Nontender, nondistended, soft, bowel sounds positive, no rebound, no ascites, no appreciable mass Extremities: No significant cyanosis, clubbing, or edema bilateral lower extremities  CBC: Recent Labs  Lab 11/18/20 0251 11/19/20 0145 11/20/20 0201  WBC 14.8* 13.6* 11.6*  NEUTROABS 12.3* 9.1* 7.0  HGB 12.9 13.2 12.6  HCT 39.6 43.6 39.5  MCV 86.5 89.2 87.0  PLT 332 399 363   Basic Metabolic Panel: Recent Labs  Lab 11/18/20 0251 11/19/20 0145 11/20/20 0201  NA 133* 134* 133*  K 4.6 4.8 4.2  CL 96* 93* 96*  CO2  27 33* 29  GLUCOSE 253* 201* 277*  BUN 20 25* 28*  CREATININE 1.16* 1.29* 1.22*  CALCIUM 9.9 10.4* 9.7  MG 1.9 2.1 1.9  PHOS 2.2* 2.4* 3.0   GFR: Estimated Creatinine Clearance: 61.3 mL/min (A) (by C-G formula based on SCr of 1.22 mg/dL (H)).  Liver Function Tests: Recent Labs  Lab 11/17/20 0056 11/18/20 0251 11/19/20 0145 11/20/20 0201  AST 13* 12* 14* 15  ALT 13 12 12 13   ALKPHOS 74 80 80 79  BILITOT 1.1 0.7 0.3 0.3  PROT 6.3* 6.6 6.8 6.3*  ALBUMIN 2.4* 2.2* 2.4* 2.3*    Coagulation  Profile: Recent Labs  Lab 11/14/20 1703 11/15/20 0304  INR 1.2 1.1    HbA1C: Hgb A1c MFr Bld  Date/Time Value Ref Range Status  11/14/2020 09:39 PM 8.5 (H) 4.8 - 5.6 % Final    Comment:    (NOTE) Pre diabetes:          5.7%-6.4%  Diabetes:              >6.4%  Glycemic control for   <7.0% adults with diabetes   04/29/2016 10:40 AM 5.6 4.8 - 5.6 % Final    Comment:    (NOTE)         Pre-diabetes: 5.7 - 6.4         Diabetes: >6.4         Glycemic control for adults with diabetes: <7.0     CBG: Recent Labs  Lab 11/19/20 0627 11/19/20 1115 11/19/20 1617 11/19/20 1933 11/20/20 0627  GLUCAP 183* 237* 256* 285* 204*    Recent Results (from the past 240 hour(s))  Resp Panel by RT-PCR (Flu A&B, Covid) Nasopharyngeal Swab     Status: None   Collection Time: 11/14/20  2:44 PM   Specimen: Nasopharyngeal Swab; Nasopharyngeal(NP) swabs in vial transport medium  Result Value Ref Range Status   SARS Coronavirus 2 by RT PCR NEGATIVE NEGATIVE Final    Comment: (NOTE) SARS-CoV-2 target nucleic acids are NOT DETECTED.  The SARS-CoV-2 RNA is generally detectable in upper respiratory specimens during the acute phase of infection. The lowest concentration of SARS-CoV-2 viral copies this assay can detect is 138 copies/mL. A negative result does not preclude SARS-Cov-2 infection and should not be used as the sole basis for treatment or other patient management decisions. A negative result may occur with  improper specimen collection/handling, submission of specimen other than nasopharyngeal swab, presence of viral mutation(s) within the areas targeted by this assay, and inadequate number of viral copies(<138 copies/mL). A negative result must be combined with clinical observations, patient history, and epidemiological information. The expected result is Negative.  Fact Sheet for Patients:  EntrepreneurPulse.com.au  Fact Sheet for Healthcare Providers:   IncredibleEmployment.be  This test is no t yet approved or cleared by the [place] FDA and  has been authorized for detection and/or diagnosis of SARS-CoV-2 by FDA under an Emergency Use Authorization (EUA). This EUA will remain  in effect (meaning this test can be used) for the duration of the COVID-19 declaration under Section 564(b)(1) of the Act, 21 U.S.C.section 360bbb-3(b)(1), unless the authorization is terminated  or revoked sooner.       Influenza A by PCR NEGATIVE NEGATIVE Final   Influenza B by PCR NEGATIVE NEGATIVE Final    Comment: (NOTE) The Xpert Xpress SARS-CoV-2/FLU/RSV plus assay is intended as an aid in the diagnosis of influenza from Nasopharyngeal swab specimens and should not be used as a  sole basis for treatment. Nasal washings and aspirates are unacceptable for Xpert Xpress SARS-CoV-2/FLU/RSV testing.  Fact Sheet for Patients: EntrepreneurPulse.com.au  Fact Sheet for Healthcare Providers: IncredibleEmployment.be  This test is not yet approved or cleared by the [place] FDA and has been authorized for detection and/or diagnosis of SARS-CoV-2 by FDA under an Emergency Use Authorization (EUA). This EUA will remain in effect (meaning this test can be used) for the duration of the COVID-19 declaration under Section 564(b)(1) of the Act, 21 U.S.C. section 360bbb-3(b)(1), unless the authorization is terminated or revoked.  Performed at [facility], [place] [address], [place], [place] [address]   Surgical PCR screen     Status: Abnormal   Collection Time: 11/15/20  5:37 AM   Specimen: Nasal Mucosa; Nasal Swab  Result Value Ref Range Status   MRSA, PCR POSITIVE (A) NEGATIVE Final    Comment: RESULT CALLED TO, READ BACK BY AND VERIFIED WITH: Eulogio Ditch RN 9:45 11/15/20 ([email or web address])    Staphylococcus aureus POSITIVE (A) NEGATIVE Final    Comment: (NOTE) The Xpert SA Assay (FDA approved for  NASAL specimens in patients 80 years of age and older), is one component of a comprehensive surveillance program. It is not intended to diagnose infection nor to guide or monitor treatment. Performed at [facility], [place] [address], [place], [place] [address]      Scheduled Meds: . acetaminophen  1,000 mg Oral Q6H  . apixaban  2.5 mg Oral BID  . bisoprolol  5 mg Oral Daily  . docusate sodium  100 mg Oral BID  . insulin aspart  0-15 Units Subcutaneous TID WC  . insulin aspart  0-5 Units Subcutaneous QHS  . insulin glargine  25 Units Subcutaneous Daily  . pantoprazole  20 mg Oral Daily  . rosuvastatin  10 mg Oral Daily   Continuous Infusions: . sodium chloride 75 mL/hr at 11/20/20 0121  . methocarbamol (ROBAXIN) IV 500 mg (11/17/20 1207)     LOS: 6 days   Cherene Altes, MD [facility] Office  [phone] Pager - Text Page per Amion  If 7PM-7AM, please contact night-coverage per Amion 11/20/2020, 10:04 AM

## 2020-11-21 DIAGNOSIS — I10 Essential (primary) hypertension: Secondary | ICD-10-CM | POA: Diagnosis not present

## 2020-11-21 DIAGNOSIS — N179 Acute kidney failure, unspecified: Secondary | ICD-10-CM | POA: Diagnosis not present

## 2020-11-21 DIAGNOSIS — J431 Panlobular emphysema: Secondary | ICD-10-CM | POA: Diagnosis not present

## 2020-11-21 DIAGNOSIS — N1831 Chronic kidney disease, stage 3a: Secondary | ICD-10-CM | POA: Diagnosis not present

## 2020-11-21 LAB — CBC WITH DIFFERENTIAL/PLATELET
Abs Immature Granulocytes: 0.04 10*3/uL (ref 0.00–0.07)
Basophils Absolute: 0.1 10*3/uL (ref 0.0–0.1)
Basophils Relative: 1 %
Eosinophils Absolute: 0.4 10*3/uL (ref 0.0–0.5)
Eosinophils Relative: 4 %
HCT: 38.9 % (ref 36.0–46.0)
Hemoglobin: 12.2 g/dL (ref 12.0–15.0)
Immature Granulocytes: 0 %
Lymphocytes Relative: 27 %
Lymphs Abs: 3 10*3/uL (ref 0.7–4.0)
MCH: 27.5 pg (ref 26.0–34.0)
MCHC: 31.4 g/dL (ref 30.0–36.0)
MCV: 87.8 fL (ref 80.0–100.0)
Monocytes Absolute: 0.9 10*3/uL (ref 0.1–1.0)
Monocytes Relative: 8 %
Neutro Abs: 6.6 10*3/uL (ref 1.7–7.7)
Neutrophils Relative %: 60 %
Platelets: 367 10*3/uL (ref 150–400)
RBC: 4.43 MIL/uL (ref 3.87–5.11)
RDW: 15.9 % — ABNORMAL HIGH (ref 11.5–15.5)
WBC: 11 10*3/uL — ABNORMAL HIGH (ref 4.0–10.5)
nRBC: 0 % (ref 0.0–0.2)

## 2020-11-21 LAB — COMPREHENSIVE METABOLIC PANEL
ALT: 13 U/L (ref 0–44)
AST: 13 U/L — ABNORMAL LOW (ref 15–41)
Albumin: 2.2 g/dL — ABNORMAL LOW (ref 3.5–5.0)
Alkaline Phosphatase: 72 U/L (ref 38–126)
Anion gap: 7 (ref 5–15)
BUN: 24 mg/dL — ABNORMAL HIGH (ref 8–23)
CO2: 29 mmol/L (ref 22–32)
Calcium: 10 mg/dL (ref 8.9–10.3)
Chloride: 98 mmol/L (ref 98–111)
Creatinine, Ser: 1.09 mg/dL — ABNORMAL HIGH (ref 0.44–1.00)
GFR, Estimated: 57 mL/min — ABNORMAL LOW (ref >60)
Glucose, Bld: 234 mg/dL — ABNORMAL HIGH (ref 70–99)
Potassium: 4.3 mmol/L (ref 3.5–5.1)
Sodium: 134 mmol/L — ABNORMAL LOW (ref 135–145)
Total Bilirubin: 0.4 mg/dL (ref 0.3–1.2)
Total Protein: 6.1 g/dL — ABNORMAL LOW (ref 6.5–8.1)

## 2020-11-21 LAB — GLUCOSE, CAPILLARY
Glucose-Capillary: 260 mg/dL — ABNORMAL HIGH (ref 70–99)
Glucose-Capillary: 321 mg/dL — ABNORMAL HIGH (ref 70–99)
Glucose-Capillary: 201 mg/dL — ABNORMAL HIGH (ref 70–99)
Glucose-Capillary: 299 mg/dL — ABNORMAL HIGH (ref 70–99)

## 2020-11-21 MED ORDER — INSULIN GLARGINE 100 UNIT/ML ~~LOC~~ SOLN
28.0000 [IU] | Freq: Every day | SUBCUTANEOUS | Status: DC
  Administered 2020-11-22: 28 [IU] via SUBCUTANEOUS
  Filled 2020-11-21 (×2): qty 0.28

## 2020-11-21 NOTE — Progress Notes (Signed)
Pt was seen for visit with pt struggling with lifting hips from bed but with BLE's could get some clearance with cues from PT.  Translated this into scooting laterally, but then requires two person help to get to bed.  Follow as tolerated for continued work to stand up with LRAD, and progress to gait skills as prior to her fall and fracture on L hip.  Follow for goals of PT, with care to give pt time to mentally prepare for this work as she is fearful to move.  11/21/20 1400  PT Visit Information  Last PT Received On 11/21/20  Assistance Needed +2  History of Present Illness Margaret [name] is a 64 y.o. female with past medical history significant for endocarditis, CHF, COPD, obesity, pulmonary hypertension, valve repair, subarachnoid bleed, CVA, diabetes who presents for evaluation after fall. She had a left sided weakness after prior stroke. She just discontinued using a wheelchair in August of this year. She normally ambulates with a cane. She fell yesterday and had immediate pain in her left hip with inability to bear weight. Orthopedics was consulted for left femur fracture and underwent IM nailing on 12/26  Subjective Data  Subjective states she uses breathing from [facility] to move better  Patient Stated Goal pain control  Precautions  Precautions Fall;Other (comment)  Precaution Comments L sided hemiparesis from previous CVA  Restrictions  Weight Bearing Restrictions Yes  LLE Weight Bearing WBAT  Pain Assessment  Pain Assessment 0-10  Pain Score 7  Pain Location Lft hip with movement  Pain Descriptors / Indicators Guarding;Grimacing;Operative site guarding  Pain Intervention(s) Limited activity within patient's tolerance;Monitored during session;Premedicated before session;Repositioned  Cognition  Arousal/Alertness Awake/alert  Behavior During Therapy Anxious  Overall Cognitive Status Impaired/Different from baseline  Area of Impairment Problem solving;Safety/judgement   Safety/Judgement Decreased awareness of safety;Decreased awareness of deficits  Awareness Anticipatory  Problem Solving Slow processing;Requires verbal cues  Bed Mobility  Overal bed mobility Needs Assistance  Bed Mobility Supine to Sit;Sit to Supine  Supine to sit Max assist  Sit to supine Mod assist;+2 for physical assistance;+2 for safety/equipment  General bed mobility comments used bed pad to get LLE out of bed  Transfers  Overall transfer level Needs assistance  Equipment used Rolling walker (2 wheeled);1 person hand held assist  Transfers Sit to/from Stand;Lateral/Scoot Transfers  Sit to Stand Total assist   Lateral/Scoot Transfers Max assist  General transfer comment lateral scoot was more controlled and managed with one person today  Ambulation/Gait  General Gait Details unable  Balance  Overall balance assessment Needs assistance  Sitting-balance support Feet supported  Sitting balance-Leahy Scale Good  General Comments  General comments (skin integrity, edema, etc.) pt requires help to get up to side of bed with one but two person due to anxiety to return to bed  Exercises  Exercises General Lower Extremity  General Exercises - Lower Extremity  Heel Slides AAROM;10 reps  Long Arc Quad AAROM;10 reps  PT - End of Session  Equipment Utilized During Treatment Gait belt;Oxygen  Activity Tolerance Patient limited by fatigue;Patient limited by pain  Patient left in bed;with call bell/phone within reach;with bed alarm set  Nurse Communication Mobility status   PT - Assessment/Plan  PT Plan Current plan remains appropriate  PT Visit Diagnosis Other abnormalities of gait and mobility (R26.89);Muscle weakness (generalized) (M62.81);Difficulty in walking, not elsewhere classified (R26.2);Pain;Hemiplegia and hemiparesis  Hemiplegia - Right/Left Left  Hemiplegia - dominant/non-dominant Non-dominant  Hemiplegia - caused by Cerebral  infarction  Pain - Right/Left Left  Pain -  part of body Hip;Leg  PT Frequency (ACUTE ONLY) Min 3X/week  Follow Up Recommendations SNF  PT equipment None recommended by PT  AM-PAC PT "6 Clicks" Mobility Outcome Measure (Version 2)  Help needed turning from your back to your side while in a flat bed without using bedrails? 2  Help needed moving from lying on your back to sitting on the side of a flat bed without using bedrails? 2  Help needed moving to and from a bed to a chair (including a wheelchair)? 1  Help needed standing up from a chair using your arms (e.g., wheelchair or bedside chair)? 1  Help needed to walk in hospital room? 1  Help needed climbing 3-5 steps with a railing?  1  6 Click Score 8  Consider Recommendation of Discharge To: CIR/SNF/LTACH  PT Goal Progression  Progress towards PT goals Progressing toward goals  PT Time Calculation  PT Start Time (ACUTE ONLY) 1258  PT Stop Time (ACUTE ONLY) 1337  PT Time Calculation (min) (ACUTE ONLY) 39 min  PT General Charges  $$ ACUTE PT VISIT 1 Visit  PT Treatments  $Therapeutic Exercise 8-22 mins  $Therapeutic Activity 23-37 mins    Mee Hives, PT MS Acute Rehab Dept. Number: [place] [facility] [place]

## 2020-11-21 NOTE — Progress Notes (Signed)
Subjective: Patient reports pain is still well controlled now. She is in great spirits and even smiling and laughing. She has pain only with movement but has been trying to breathe and push her way through it. PT has been able to make progress with her sitting on the EOB and moving herself in the bed. Has not been able to mobilize OOB. Tolerating diet. Had a very large liquidy BM this morning. Denies CP, SOB, weakness/dizziness. She is eager to get better and excited about going to [facility] which is the SNF she requested.   Objective:   VITALS:   Vitals:   11/20/20 2041 11/21/20 0300 11/21/20 0820 11/21/20 0823  BP: 122/69 (!) 145/62 (!) 147/63   Pulse: 68 66 (!) 57   Resp: 18 18 18    Temp: 98.6 F (37 C) 98.5 F (36.9 C) (!) 97.5 F (36.4 C)   TempSrc: Oral Oral Oral   SpO2: 94% 100% 99% 97%  Weight:      Height:       CBC Latest Ref Rng & Units 11/21/2020 11/20/2020 11/19/2020  WBC 4.0 - 10.5 K/uL 11.0(H) 11.6(H) 13.6(H)  Hemoglobin 12.0 - 15.0 g/dL 11/21/2020 14.4 81.8  Hematocrit 36.0 - 46.0 % 38.9 39.5 43.6  Platelets 150 - 400 K/uL 367 363 399   BMP Latest Ref Rng & Units 11/21/2020 11/20/2020 11/19/2020  Glucose 70 - 99 mg/dL 11/21/2020) 149(F) 026(V)  BUN 8 - 23 mg/dL 785(Y) 85(O) 27(X)  Creatinine 0.44 - 1.00 mg/dL 41(O) 8.78(M) 7.67(M)  Sodium 135 - 145 mmol/L 134(L) 133(L) 134(L)  Potassium 3.5 - 5.1 mmol/L 4.3 4.2 4.8  Chloride 98 - 111 mmol/L 98 96(L) 93(L)  CO2 22 - 32 mmol/L 29 29 33(H)  Calcium 8.9 - 10.3 mg/dL 0.94(B 9.7 10.4(H)   Intake/Output      12/29 0701 12/30 0700 12/30 0701 12/31 0700   P.O. 720    I.V. (mL/kg)     Total Intake(mL/kg) 720 (5.7)    Urine (mL/kg/hr) 1750 (0.6)    Stool 0    Total Output 1750    Net -1030         Urine Occurrence 1 x    Stool Occurrence 1 x        Physical Exam: General: NAD. Laying in bed. Awake. Just finished PT session. Still calm and clear headed today. Very conversant. Eager to get better. MSK Neurovascularly  intact Sensation intact distally Intact pulses distally Dorsiflexion/Plantar flexion intact Incision: covered with dressing that is C/D/I, large eechymosis seen around dressings that is to be expected postop Ecchymosis on left shin from original fall that is TTP   Assessment: 4 Days Post-Op  S/P Procedure(s) (LRB): INTRAMEDULLARY (IM) NAIL INTERTROCHANTRIC (Left) by Dr. 1/32. Jewel Baize on 11/17/20  Principal Problem:   Closed left femoral fracture (HCC) Active Problems:   COPD (chronic obstructive pulmonary disease) (HCC)   Chronic diastolic CHF (congestive heart failure) (HCC)   Morbid obesity (HCC)   Essential hypertension   HLD (hyperlipidemia)   History of CVA with residual deficit   Hyperglycemia due to diabetes mellitus (HCC)   Fall at home, initial encounter   Acute renal failure superimposed on stage 3a chronic kidney disease (HCC)   Closed left intertrochanteric femur fracture, status post IM nail Doing well postop day 4, getting better every day Tolerating diet and voiding, large liquid BM this morning Pain well controlled now Not yet mobilized OOB but has gotten to EOB  Plan: Spoke with  the PT after she finished her session with the pt. She is happy with the progress the pt is starting to make. Still hasn't been able to get her OOB yet. Continue to work with therapy on mobilization Continue with the improved pain control Use Incentive Spirometry Apply ice to area PRN  Weightbearing: WBAT LLE Insicional and dressing care: Dressings left intact until follow-up, can change if bloody or falling off Showering: Keep dressing dry VTE prophylaxis: Eliquis, SCDs, ambulation Pain control: Continue current regimen Follow up: with Dr. Percell Miller 7-10 days after discharge Contact information:  Edmonia Lynch MD, Aggie Moats PA-C  Dispo: Going to a SNF. Pt asking for [facility]. Social work arranging placement. Continue to work with PT. Discharge when mobilized and ready  medically. Hopefully within the next 24 hours. Ok to d/c from ortho standpoint.   Britt Bottom, PA-C 11/21/2020, 9:21 AM

## 2020-11-21 NOTE — Progress Notes (Signed)
Margaret [name]  [id number] DOB: July 06, 1956 DOA: 11/14/2020 PCP: Patient, No Pcp Per    Brief Narrative:  64 year old with a history of panic attacks, chronic diastolic CHF, mitral valve repair with annuloplasty on chronic Eliquis, endocarditis, COPD, pulmonary hypertension, subarachnoid hemorrhage, CVA, DM2 with hyperglycemia, splenic infarction, CKD stage IIIa, and obesity who presented to the ED after a mechanical fall at home.  After her fall she began to experience significant left hip pain and presented to the ED for evaluation.  X-rays noted an intertrochanteric femur fracture on the left in the ED but fortunately no other acute findings were appreciated.  Significant Events:  12/23 admit via [place] with left hip fracture 12/26 operative repair with left hip nail  Antimicrobials:  None  DVT prophylaxis: Eliquis  Subjective: Afebrile.  Vital signs remained stable.  Creatinine is steadily improving.  CBG not yet at goal.  Patient is still very much interested in going home instead of a rehab facility if this is possible.  I have requested PT/OT reevaluate the patient as I doubt this is a safe discharge option.  Assessment & Plan:  Left intertrochanteric femur fracture after mechanical fall Care per Orthopedics -status post left hip IM nail - working w/ PT/OT who thus far recommend SNF -cleared for discharge from orthopedic standpoint  Chronic diastolic CHF Continue beta-blocker and diuretic -well compensated presently  HTN Blood pressure controlled  Prior mitral valve repair Eliquis continues  COPD Does not require home O2 -well compensated  Prior CVA with left-sided residual weakness For SNF or rehab  Uncontrolled DM2 with hyperglycemia A1c 8.5 - adjust tx again today as CBG not yet at goal   Acute kidney injury on CKD stage IIIa Baseline creatinine approximately 1.2 -creatinine presently better than her baseline/stable  HLD Continue usual  Crestor  Morbid obesity - Body mass index is 47.73 kg/m.    Code Status: FULL CODE Family Communication: No family present at time of exam today Status is: Inpatient  Remains inpatient appropriate because:Unsafe d/c plan   Dispo: The patient is from: Home              Anticipated d/c is to: SNF              Anticipated d/c date is: 1 day              Patient currently is medically stable to d/c.   Consultants:  Ortho  Objective: Blood pressure (!) 147/63, pulse (!) 57, temperature (!) 97.5 F (36.4 C), temperature source Oral, resp. rate 18, height 5' 4.02" (1.626 m), weight 126.2 kg, SpO2 97 %.  Intake/Output Summary (Last 24 hours) at 11/21/2020 0948 Last data filed at 11/21/2020 0500 Gross per 24 hour  Intake 480 ml  Output 1750 ml  Net -1270 ml   Filed Weights   11/18/20 0500 11/19/20 0500 11/20/20 0500  Weight: 128.4 kg 126.8 kg 126.2 kg    Examination: General: No acute respiratory distress Lungs: Clear to auscultation bilaterally Cardiovascular: RRR Abdomen: Protuberant, soft, BS positive Extremities: No significant edema lower extremities  CBC: Recent Labs  Lab 11/19/20 0145 11/20/20 0201 11/21/20 0231  WBC 13.6* 11.6* 11.0*  NEUTROABS 9.1* 7.0 6.6  HGB 13.2 12.6 12.2  HCT 43.6 39.5 38.9  MCV 89.2 87.0 87.8  PLT 399 363 [id number]   Basic Metabolic Panel: Recent Labs  Lab 11/18/20 0251 11/19/20 0145 11/20/20 0201 11/21/20 0231  NA 133* 134* 133* 134*  K 4.6 4.8 4.2 4.3  CL 96* 93* 96* 98  CO2 27 33* 29 29  GLUCOSE 253* 201* 277* 234*  BUN 20 25* 28* 24*  CREATININE 1.16* 1.29* 1.22* 1.09*  CALCIUM 9.9 10.4* 9.7 10.0  MG 1.9 2.1 1.9  --   PHOS 2.2* 2.4* 3.0  --    GFR: Estimated Creatinine Clearance: 68.6 mL/min (A) (by C-G formula based on SCr of 1.09 mg/dL (H)).  Liver Function Tests: Recent Labs  Lab 11/18/20 0251 11/19/20 0145 11/20/20 0201 11/21/20 0231  AST 12* 14* 15 13*  ALT 12 12 13 13   ALKPHOS 80 80 79 72  BILITOT 0.7  0.3 0.3 0.4  PROT 6.6 6.8 6.3* 6.1*  ALBUMIN 2.2* 2.4* 2.3* 2.2*    Coagulation Profile: Recent Labs  Lab 11/14/20 1703 11/15/20 0304  INR 1.2 1.1    HbA1C: Hgb A1c MFr Bld  Date/Time Value Ref Range Status  11/14/2020 09:39 PM 8.5 (H) 4.8 - 5.6 % Final    Comment:    (NOTE) Pre diabetes:          5.7%-6.4%  Diabetes:              >6.4%  Glycemic control for   <7.0% adults with diabetes   04/29/2016 10:40 AM 5.6 4.8 - 5.6 % Final    Comment:    (NOTE)         Pre-diabetes: 5.7 - 6.4         Diabetes: >6.4         Glycemic control for adults with diabetes: <7.0     CBG: Recent Labs  Lab 11/20/20 0627 11/20/20 1120 11/20/20 1635 11/20/20 2040 11/21/20 0650  GLUCAP 204* 228* 186* 181* 260*    Recent Results (from the past 240 hour(s))  Resp Panel by RT-PCR (Flu A&B, Covid) Nasopharyngeal Swab     Status: None   Collection Time: 11/14/20  2:44 PM   Specimen: Nasopharyngeal Swab; Nasopharyngeal(NP) swabs in vial transport medium  Result Value Ref Range Status   SARS Coronavirus 2 by RT PCR NEGATIVE NEGATIVE Final    Comment: (NOTE) SARS-CoV-2 target nucleic acids are NOT DETECTED.  The SARS-CoV-2 RNA is generally detectable in upper respiratory specimens during the acute phase of infection. The lowest concentration of SARS-CoV-2 viral copies this assay can detect is 138 copies/mL. A negative result does not preclude SARS-Cov-2 infection and should not be used as the sole basis for treatment or other patient management decisions. A negative result may occur with  improper specimen collection/handling, submission of specimen other than nasopharyngeal swab, presence of viral mutation(s) within the areas targeted by this assay, and inadequate number of viral copies(<138 copies/mL). A negative result must be combined with clinical observations, patient history, and epidemiological information. The expected result is Negative.  Fact Sheet for Patients:   EntrepreneurPulse.com.au  Fact Sheet for Healthcare Providers:  IncredibleEmployment.be  This test is no t yet approved or cleared by the [place] FDA and  has been authorized for detection and/or diagnosis of SARS-CoV-2 by FDA under an Emergency Use Authorization (EUA). This EUA will remain  in effect (meaning this test can be used) for the duration of the COVID-19 declaration under Section 564(b)(1) of the Act, 21 U.S.C.section 360bbb-3(b)(1), unless the authorization is terminated  or revoked sooner.       Influenza A by PCR NEGATIVE NEGATIVE Final   Influenza B by PCR NEGATIVE NEGATIVE Final    Comment: (NOTE) The Xpert Xpress SARS-CoV-2/FLU/RSV plus assay is intended as  an aid in the diagnosis of influenza from Nasopharyngeal swab specimens and should not be used as a sole basis for treatment. Nasal washings and aspirates are unacceptable for Xpert Xpress SARS-CoV-2/FLU/RSV testing.  Fact Sheet for Patients: BloggerCourse.com  Fact Sheet for Healthcare Providers: SeriousBroker.it  This test is not yet approved or cleared by the [place] FDA and has been authorized for detection and/or diagnosis of SARS-CoV-2 by FDA under an Emergency Use Authorization (EUA). This EUA will remain in effect (meaning this test can be used) for the duration of the COVID-19 declaration under Section 564(b)(1) of the Act, 21 U.S.C. section 360bbb-3(b)(1), unless the authorization is terminated or revoked.  Performed at [facility], [address] [place], [place] [address]   Surgical PCR screen     Status: Abnormal   Collection Time: 11/15/20  5:37 AM   Specimen: Nasal Mucosa; Nasal Swab  Result Value Ref Range Status   MRSA, PCR POSITIVE (A) NEGATIVE Final    Comment: RESULT CALLED TO, READ BACK BY AND VERIFIED WITH: Redmond Baseman RN 9:45 11/15/20 ([email or web address])    Staphylococcus aureus  POSITIVE (A) NEGATIVE Final    Comment: (NOTE) The Xpert SA Assay (FDA approved for NASAL specimens in patients 83 years of age and older), is one component of a comprehensive surveillance program. It is not intended to diagnose infection nor to guide or monitor treatment. Performed at [facility], [address], [place], [place] [address]      Scheduled Meds: . acetaminophen  1,000 mg Oral Q6H  . apixaban  2.5 mg Oral BID  . bisoprolol  5 mg Oral Daily  . docusate sodium  100 mg Oral BID  . insulin aspart  0-15 Units Subcutaneous TID WC  . insulin aspart  0-5 Units Subcutaneous QHS  . insulin glargine  25 Units Subcutaneous Daily  . pantoprazole  20 mg Oral Daily  . rosuvastatin  10 mg Oral Daily   Continuous Infusions: . sodium chloride 50 mL/hr at 11/20/20 1217  . methocarbamol (ROBAXIN) IV 500 mg (11/17/20 1207)     LOS: 7 days   Lonia Blood, MD [facility] Office  [phone] Pager - Text Page per Amion  If 7PM-7AM, please contact night-coverage per Amion 11/21/2020, 9:48 AM

## 2020-11-22 DIAGNOSIS — N1831 Chronic kidney disease, stage 3a: Secondary | ICD-10-CM | POA: Diagnosis not present

## 2020-11-22 DIAGNOSIS — N179 Acute kidney failure, unspecified: Secondary | ICD-10-CM | POA: Diagnosis not present

## 2020-11-22 DIAGNOSIS — I10 Essential (primary) hypertension: Secondary | ICD-10-CM | POA: Diagnosis not present

## 2020-11-22 DIAGNOSIS — J431 Panlobular emphysema: Secondary | ICD-10-CM | POA: Diagnosis not present

## 2020-11-22 LAB — GLUCOSE, CAPILLARY
Glucose-Capillary: 194 mg/dL — ABNORMAL HIGH (ref 70–99)
Glucose-Capillary: 246 mg/dL — ABNORMAL HIGH (ref 70–99)
Glucose-Capillary: 259 mg/dL — ABNORMAL HIGH (ref 70–99)
Glucose-Capillary: 229 mg/dL — ABNORMAL HIGH (ref 70–99)

## 2020-11-22 NOTE — Plan of Care (Signed)

## 2020-11-22 NOTE — Plan of Care (Signed)
  Problem: Education: Goal: Knowledge of General Education information will improve Description: Including pain rating scale, medication(s)/side effects and non-pharmacologic comfort measures Outcome: Progressing   Problem: Clinical Measurements: Goal: Ability to maintain clinical measurements within normal limits will improve Outcome: Progressing Goal: Will remain free from infection Outcome: Progressing Goal: Diagnostic test results will improve Outcome: Progressing Goal: Respiratory complications will improve Outcome: Progressing Goal: Cardiovascular complication will be avoided Outcome: Progressing   Problem: Activity: Goal: Risk for activity intolerance will decrease Outcome: Progressing   Problem: Coping: Goal: Level of anxiety will decrease Outcome: Progressing   Problem: Pain Managment: Goal: General experience of comfort will improve Outcome: Progressing   

## 2020-11-22 NOTE — Progress Notes (Signed)
Margaret [name]  [id number] DOB: 11-15-1956 DOA: 11/14/2020 PCP: Patient, No Pcp Per    Brief Narrative:  64 year old with a history of panic attacks, chronic diastolic CHF, mitral valve repair with annuloplasty on chronic Eliquis, endocarditis, COPD, pulmonary hypertension, subarachnoid hemorrhage, CVA, DM2 with hyperglycemia, splenic infarction, CKD stage IIIa, and obesity who presented to the ED after a mechanical fall at home.  After her fall she began to experience significant left hip pain and presented to the ED for evaluation.  X-rays noted an intertrochanteric femur fracture on the left in the ED but fortunately no other acute findings were appreciated.  Significant Events:  12/23 admit via [place] with left hip fracture 12/26 operative repair with left hip nail  Antimicrobials:  None  DVT prophylaxis: Eliquis  Subjective: PT worked with the patient yesterday and it appears clear that she is not fit for discharge home, even with significant assistance available.  SNF placement pending.  Afebrile.  Saturations 99% on room air.  CBG remains variable  Assessment & Plan:  Left intertrochanteric femur fracture after mechanical fall Care per Orthopedics -status post left hip IM nail - working w/ PT/OT - cleared for discharge from orthopedic standpoint -requires SNF stay as attested to by PT and repeat evaluation [id number]  Chronic diastolic CHF Continue beta-blocker and diuretic -no clinical overload -net negative approximately 6 L since admission  HTN Blood pressure control variable -monitor without change today  Prior mitral valve repair Eliquis continues  COPD Does not require home O2 -well compensated  Prior CVA with left-sided residual weakness For SNF   Uncontrolled DM2 with hyperglycemia A1c 8.5 -continue to titrate insulin with CBG not yet at goal  Acute kidney injury on CKD stage IIIa Baseline creatinine approximately 1.2 -creatinine presently better than  her baseline/stable  HLD Continue usual Crestor  Morbid obesity - Body mass index is 47.73 kg/m.    Code Status: FULL CODE Family Communication: No family present at time of exam today Status is: Inpatient  Remains inpatient appropriate because:Unsafe d/c plan   Dispo: The patient is from: Home              Anticipated d/c is to: SNF              Anticipated d/c date is: 1 day              Patient currently is medically stable to d/c.   Consultants:  Ortho  Objective: Blood pressure (!) 155/66, pulse 63, temperature 98.4 F (36.9 C), temperature source Oral, resp. rate 18, height 5' 4.02" (1.626 m), weight 126.2 kg, SpO2 99 %.  Intake/Output Summary (Last 24 hours) at 11/22/2020 0914 Last data filed at 11/22/2020 0400 Gross per 24 hour  Intake -  Output 1300 ml  Net -1300 ml   Filed Weights   11/18/20 0500 11/19/20 0500 11/20/20 0500  Weight: 128.4 kg 126.8 kg 126.2 kg    Examination: General: No acute respiratory distress Lungs: Clear to auscultation B Cardiovascular: RRR Abdomen: Protuberant, soft, BS+ Extremities: No significant edema B LE   CBC: Recent Labs  Lab 11/19/20 0145 11/20/20 0201 11/21/20 0231  WBC 13.6* 11.6* 11.0*  NEUTROABS 9.1* 7.0 6.6  HGB 13.2 12.6 12.2  HCT 43.6 39.5 38.9  MCV 89.2 87.0 87.8  PLT 399 363 [id number]   Basic Metabolic Panel: Recent Labs  Lab 11/18/20 0251 11/19/20 0145 11/20/20 0201 11/21/20 0231  NA 133* 134* 133* 134*  K 4.6 4.8 4.2 4.3  CL 96* 93* 96* 98  CO2 27 33* 29 29  GLUCOSE 253* 201* 277* 234*  BUN 20 25* 28* 24*  CREATININE 1.16* 1.29* 1.22* 1.09*  CALCIUM 9.9 10.4* 9.7 10.0  MG 1.9 2.1 1.9  --   PHOS 2.2* 2.4* 3.0  --    GFR: Estimated Creatinine Clearance: 68.6 mL/min (A) (by C-G formula based on SCr of 1.09 mg/dL (H)).  Liver Function Tests: Recent Labs  Lab 11/18/20 0251 11/19/20 0145 11/20/20 0201 11/21/20 0231  AST 12* 14* 15 13*  ALT 12 12 13 13   ALKPHOS 80 80 79 72  BILITOT 0.7  0.3 0.3 0.4  PROT 6.6 6.8 6.3* 6.1*  ALBUMIN 2.2* 2.4* 2.3* 2.2*    Coagulation Profile: No results for input(s): INR, PROTIME in the last 168 hours.  HbA1C: Hgb A1c MFr Bld  Date/Time Value Ref Range Status  11/14/2020 09:39 PM 8.5 (H) 4.8 - 5.6 % Final    Comment:    (NOTE) Pre diabetes:          5.7%-6.4%  Diabetes:              >6.4%  Glycemic control for   <7.0% adults with diabetes   04/29/2016 10:40 AM 5.6 4.8 - 5.6 % Final    Comment:    (NOTE)         Pre-diabetes: 5.7 - 6.4         Diabetes: >6.4         Glycemic control for adults with diabetes: <7.0     CBG: Recent Labs  Lab 11/21/20 0650 11/21/20 1140 11/21/20 1628 11/21/20 2104 11/22/20 0726  GLUCAP 260* 321* 201* 299* 194*    Recent Results (from the past 240 hour(s))  Resp Panel by RT-PCR (Flu A&B, Covid) Nasopharyngeal Swab     Status: None   Collection Time: 11/14/20  2:44 PM   Specimen: Nasopharyngeal Swab; Nasopharyngeal(NP) swabs in vial transport medium  Result Value Ref Range Status   SARS Coronavirus 2 by RT PCR NEGATIVE NEGATIVE Final    Comment: (NOTE) SARS-CoV-2 target nucleic acids are NOT DETECTED.  The SARS-CoV-2 RNA is generally detectable in upper respiratory specimens during the acute phase of infection. The lowest concentration of SARS-CoV-2 viral copies this assay can detect is 138 copies/mL. A negative result does not preclude SARS-Cov-2 infection and should not be used as the sole basis for treatment or other patient management decisions. A negative result may occur with  improper specimen collection/handling, submission of specimen other than nasopharyngeal swab, presence of viral mutation(s) within the areas targeted by this assay, and inadequate number of viral copies(<138 copies/mL). A negative result must be combined with clinical observations, patient history, and epidemiological information. The expected result is Negative.  Fact Sheet for Patients:   EntrepreneurPulse.com.au  Fact Sheet for Healthcare Providers:  IncredibleEmployment.be  This test is no t yet approved or cleared by the [place] FDA and  has been authorized for detection and/or diagnosis of SARS-CoV-2 by FDA under an Emergency Use Authorization (EUA). This EUA will remain  in effect (meaning this test can be used) for the duration of the COVID-19 declaration under Section 564(b)(1) of the Act, 21 U.S.C.section 360bbb-3(b)(1), unless the authorization is terminated  or revoked sooner.       Influenza A by PCR NEGATIVE NEGATIVE Final   Influenza B by PCR NEGATIVE NEGATIVE Final    Comment: (NOTE) The Xpert Xpress SARS-CoV-2/FLU/RSV plus assay is intended as an aid in  the diagnosis of influenza from Nasopharyngeal swab specimens and should not be used as a sole basis for treatment. Nasal washings and aspirates are unacceptable for Xpert Xpress SARS-CoV-2/FLU/RSV testing.  Fact Sheet for Patients: BloggerCourse.com  Fact Sheet for Healthcare Providers: SeriousBroker.it  This test is not yet approved or cleared by the [place] FDA and has been authorized for detection and/or diagnosis of SARS-CoV-2 by FDA under an Emergency Use Authorization (EUA). This EUA will remain in effect (meaning this test can be used) for the duration of the COVID-19 declaration under Section 564(b)(1) of the Act, 21 U.S.C. section 360bbb-3(b)(1), unless the authorization is terminated or revoked.  Performed at [facility], [address], [place], [place] [address]   Surgical PCR screen     Status: Abnormal   Collection Time: 11/15/20  5:37 AM   Specimen: Nasal Mucosa; Nasal Swab  Result Value Ref Range Status   MRSA, PCR POSITIVE (A) NEGATIVE Final    Comment: RESULT CALLED TO, READ BACK BY AND VERIFIED WITH: Redmond Baseman RN 9:45 11/15/20 ([email or web address])    Staphylococcus aureus  POSITIVE (A) NEGATIVE Final    Comment: (NOTE) The Xpert SA Assay (FDA approved for NASAL specimens in patients 28 years of age and older), is one component of a comprehensive surveillance program. It is not intended to diagnose infection nor to guide or monitor treatment. Performed at [facility], [address], [place], [place] [address]      Scheduled Meds: . acetaminophen  1,000 mg Oral Q6H  . apixaban  2.5 mg Oral BID  . bisoprolol  5 mg Oral Daily  . docusate sodium  100 mg Oral BID  . insulin aspart  0-15 Units Subcutaneous TID WC  . insulin aspart  0-5 Units Subcutaneous QHS  . insulin glargine  28 Units Subcutaneous Daily  . pantoprazole  20 mg Oral Daily  . rosuvastatin  10 mg Oral Daily   Continuous Infusions: . methocarbamol (ROBAXIN) IV 500 mg (11/17/20 1207)     LOS: 8 days   Lonia Blood, MD [facility] Office  [phone] Pager - Text Page per Amion  If 7PM-7AM, please contact night-coverage per Amion 11/22/2020, 9:14 AM

## 2020-11-22 NOTE — Progress Notes (Signed)
Occupational Therapy Treatment Patient Details Name: CATALEAH STITES MRN: [id number] DOB: 06/09/1956 Today's Date: 11/22/2020    History of present illness JASLYNNE DAHAN is a 64 y.o. female with past medical history significant for endocarditis, CHF, COPD, obesity, pulmonary hypertension, valve repair, subarachnoid bleed, CVA, diabetes who presents for evaluation after fall. She had a left sided weakness after prior stroke. She just discontinued using a wheelchair in August of this year. She normally ambulates with a cane. She fell yesterday and had immediate pain in her left hip with inability to bear weight. Orthopedics was consulted for left femur fracture and underwent IM nailing on 12/26   OT comments  Pt progressing well towards OT goals, which are updated accordingly. Pt continues to have pain in L LE but motivated to attempt OOB activities today. Pt overall demo improved ability to advance R LE to EOB and demonstrated sit to stand transfer with hemiwalker at Max A x 2 (close to progressing to Mod A x 2). Pt with desire to attempt taking steps at bedside but unable to successfully lift feet today. Pt pleased with progress and reports desire to return home but understanding of need for more rehab. Pt agreeable for ST rehab at SNF. Plan to progress transfers OOB during next session.    Follow Up Recommendations  SNF;Supervision/Assistance - 24 hour    Equipment Recommendations  Tub/shower bench    Recommendations for Other Services      Precautions / Restrictions Precautions Precautions: Fall;Other (comment) Precaution Comments: L sided hemiparesis from previous CVA Restrictions Weight Bearing Restrictions: Yes LLE Weight Bearing: Weight bearing as tolerated       Mobility Bed Mobility Overal bed mobility: Needs Assistance Bed Mobility: Supine to Sit;Sit to Supine     Supine to sit: Mod assist;+2 for safety/equipment;HOB elevated Sit to supine: Max assist;+2 for  physical assistance;+2 for safety/equipment   General bed mobility comments: Improving ability to advance L LE to EOB with gravity minimized (therapist support under LE). Increased assist needed to return B LE back to bed. Pt able to assist in scooting up in bed by using R LE and R UE  Transfers Overall transfer level: Needs assistance Equipment used: Hemi-walker Transfers: Sit to/from Stand;Lateral/Scoot Transfers Sit to Stand: Max assist;+2 safety/equipment;+2 physical assistance        Lateral/Scoot Transfers: Max assist;+2 physical assistance;+2 safety/equipment General transfer comment: Pt able to demo sit to stand from bedside using hemiwalker, Max A x 2 close to progressing to Mod A x 2. Pt motivated to attempt steps along bedside today but unable to lift B feet successfully. Pt Max A x 2 for scooting along bedside with assist of pads    Balance Overall balance assessment: Needs assistance Sitting-balance support: Feet supported Sitting balance-Leahy Scale: Good Sitting balance - Comments: supervision for safety.   Standing balance support: Single extremity supported Standing balance-Leahy Scale: Poor Standing balance comment: reliant on UE and external support                           ADL either performed or assessed with clinical judgement   ADL Overall ADL's : Needs assistance/impaired                     Lower Body Dressing: Total assistance;Bed level Lower Body Dressing Details (indicate cue type and reason): Total A to don socks in bed. Began problem solving these tasks with pt due to increased pain  with hip flexion for bending to feet               General ADL Comments: Improving pain tolerance and engaged throughout session. Focused on standing attempts today     Vision   Vision Assessment?: No apparent visual deficits   Perception     Praxis      Cognition Arousal/Alertness: Awake/alert Behavior During Therapy: WFL for tasks  assessed/performed Overall Cognitive Status: Impaired/Different from baseline Area of Impairment: Problem solving;Safety/judgement                         Safety/Judgement: Decreased awareness of safety;Decreased awareness of deficits Awareness: Anticipatory Problem Solving: Requires verbal cues;Difficulty sequencing General Comments: Pt very pleasant and motivated during session. Still anxious with movement but does well when steps explained and verbalizing understanding before proceeding.        Exercises     Shoulder Instructions       General Comments SpO2 WFL on RA though 2/4 DOE noted. Assisted pt to chair position in bed at end of session    Pertinent Vitals/ Pain       Pain Assessment: Faces Faces Pain Scale: Hurts even more Pain Location: L hip/LE at end of session Pain Descriptors / Indicators: Guarding;Grimacing;Operative site guarding Pain Intervention(s): Premedicated before session;Repositioned;Monitored during session  Home Living                                          Prior Functioning/Environment              Frequency  Min 2X/week        Progress Toward Goals  OT Goals(current goals can now be found in the care plan section)  Progress towards OT goals: Progressing toward goals  Acute Rehab OT Goals Patient Stated Goal: pain control, return to PLOF OT Goal Formulation: With patient Time For Goal Achievement: 12/02/20 Potential to Achieve Goals: Good ADL Goals Pt Will Perform Lower Body Bathing: with min assist;sitting/lateral leans;sit to/from stand Pt Will Perform Upper Body Dressing: with set-up;sitting Pt Will Perform Lower Body Dressing: with min assist;sitting/lateral leans;sit to/from stand Pt Will Transfer to Toilet: with mod assist;stand pivot transfer;bedside commode Additional ADL Goal #1: Pt to demonstrate ability to tolerate sitting EOB > 5 min during ADL tasks with no more than min guard for balance   Plan Discharge plan remains appropriate    Co-evaluation                 AM-PAC OT "6 Clicks" Daily Activity     Outcome Measure   Help from another person eating meals?: A Little Help from another person taking care of personal grooming?: A Little Help from another person toileting, which includes using toliet, bedpan, or urinal?: Total Help from another person bathing (including washing, rinsing, drying)?: Total Help from another person to put on and taking off regular upper body clothing?: A Lot Help from another person to put on and taking off regular lower body clothing?: Total 6 Click Score: 11    End of Session Equipment Utilized During Treatment: Gait belt;Other (comment) (hemiwalker)  OT Visit Diagnosis: Other abnormalities of gait and mobility (R26.89);Muscle weakness (generalized) (M62.81);History of falling (Z91.81);Pain Pain - Right/Left: Left Pain - part of body: Hip   Activity Tolerance Patient tolerated treatment well   Patient Left in bed;with call bell/phone  within reach;with bed alarm set   Nurse Communication Mobility status        Time: 1125-1205 OT Time Calculation (min): 40 min  Charges: OT General Charges $OT Visit: 1 Visit OT Treatments $Self Care/Home Management : 8-22 mins $Therapeutic Activity: 23-37 mins  Layla Maw, OTR/L   Layla Maw 11/22/2020, 12:26 PM

## 2020-11-23 DIAGNOSIS — J431 Panlobular emphysema: Secondary | ICD-10-CM | POA: Diagnosis not present

## 2020-11-23 DIAGNOSIS — I10 Essential (primary) hypertension: Secondary | ICD-10-CM | POA: Diagnosis not present

## 2020-11-23 DIAGNOSIS — N179 Acute kidney failure, unspecified: Secondary | ICD-10-CM | POA: Diagnosis not present

## 2020-11-23 DIAGNOSIS — N1831 Chronic kidney disease, stage 3a: Secondary | ICD-10-CM | POA: Diagnosis not present

## 2020-11-23 LAB — GLUCOSE, CAPILLARY
Glucose-Capillary: 294 mg/dL — ABNORMAL HIGH (ref 70–99)
Glucose-Capillary: 249 mg/dL — ABNORMAL HIGH (ref 70–99)
Glucose-Capillary: 232 mg/dL — ABNORMAL HIGH (ref 70–99)
Glucose-Capillary: 290 mg/dL — ABNORMAL HIGH (ref 70–99)

## 2020-11-23 MED ORDER — INSULIN ASPART 100 UNIT/ML ~~LOC~~ SOLN
0.0000 [IU] | Freq: Three times a day (TID) | SUBCUTANEOUS | Status: DC
  Administered 2020-11-23 – 2020-11-24 (×3): 7 [IU] via SUBCUTANEOUS
  Administered 2020-11-24: 11 [IU] via SUBCUTANEOUS
  Administered 2020-11-24: 4 [IU] via SUBCUTANEOUS
  Administered 2020-11-25 (×3): 7 [IU] via SUBCUTANEOUS
  Administered 2020-11-26: 4 [IU] via SUBCUTANEOUS
  Administered 2020-11-26: 11 [IU] via SUBCUTANEOUS
  Administered 2020-11-26: 4 [IU] via SUBCUTANEOUS
  Administered 2020-11-27: 7 [IU] via SUBCUTANEOUS
  Administered 2020-11-27: 11 [IU] via SUBCUTANEOUS
  Administered 2020-11-27: 4 [IU] via SUBCUTANEOUS
  Administered 2020-11-28: 7 [IU] via SUBCUTANEOUS
  Administered 2020-11-28: 4 [IU] via SUBCUTANEOUS

## 2020-11-23 MED ORDER — INSULIN ASPART 100 UNIT/ML ~~LOC~~ SOLN
0.0000 [IU] | Freq: Every day | SUBCUTANEOUS | Status: DC
  Administered 2020-11-23 – 2020-11-25 (×3): 3 [IU] via SUBCUTANEOUS
  Administered 2020-11-26: 2 [IU] via SUBCUTANEOUS

## 2020-11-23 MED ORDER — INSULIN GLARGINE 100 UNIT/ML ~~LOC~~ SOLN
32.0000 [IU] | Freq: Every day | SUBCUTANEOUS | Status: DC
  Administered 2020-11-23 – 2020-11-24 (×2): 32 [IU] via SUBCUTANEOUS
  Filled 2020-11-23 (×2): qty 0.32

## 2020-11-23 NOTE — Progress Notes (Signed)
Margaret [name]  [id number] DOB: 07-25-1956 DOA: 11/14/2020 PCP: Patient, No Pcp Per    Brief Narrative:  65 year old with a history of panic attacks, chronic diastolic CHF, mitral valve repair with annuloplasty on chronic Eliquis, endocarditis, COPD, pulmonary hypertension, subarachnoid hemorrhage, CVA, DM2 with hyperglycemia, splenic infarction, CKD stage IIIa, and obesity who presented to the ED after a mechanical fall at home.  After her fall she began to experience significant left hip pain and presented to the ED for evaluation.  X-rays noted an intertrochanteric femur fracture on the left in the ED but fortunately no other acute findings were appreciated.  Significant Events:  12/23 admit via [facility] with left hip fracture 12/26 operative repair with left hip nail  Antimicrobials:  None  DVT prophylaxis: Eliquis  Subjective: Afebrile.  Vital signs stable.  Oxygen saturation 96% on room air.  Currently awaiting SNF placement with patient medically stable for discharge.  Assessment & Plan:  Left intertrochanteric femur fracture after mechanical fall Care per Orthopedics -status post left hip IM nail - working w/ PT/OT - cleared for discharge from orthopedic standpoint -requires SNF stay as attested to by PT on repeat evaluation [id number]  Chronic diastolic CHF Continue beta-blocker and diuretic -no clinical overload  HTN Blood pressure control variable -monitor without change today  Prior mitral valve repair Eliquis continues  COPD Does not require home O2 -well compensated  Prior CVA with left-sided residual weakness For SNF   Uncontrolled DM2 with hyperglycemia A1c 8.5 -CBG not yet at goal -adjust treatment again today -this should be followed in the outpatient setting  Acute kidney injury on CKD stage IIIa Baseline creatinine approximately 1.2 -creatinine presently better than her baseline/stable  HLD Continue usual Crestor  Morbid obesity - Body mass  index is 47.73 kg/m.    Code Status: FULL CODE Family Communication: No family present at time of exam today Status is: Inpatient  Remains inpatient appropriate because:Unsafe d/c plan   Dispo: The patient is from: Home              Anticipated d/c is to: SNF              Anticipated d/c date is: 1 day              Patient currently is medically stable to d/c.   Consultants:  Ortho  Objective: Blood pressure 127/72, pulse 62, temperature 97.9 F (36.6 C), temperature source Oral, resp. rate 20, height 5' 4.02" (1.626 m), weight 126.2 kg, SpO2 96 %.  Intake/Output Summary (Last 24 hours) at 11/23/2020 0919 Last data filed at 11/23/2020 0900 Gross per 24 hour  Intake 480 ml  Output 600 ml  Net -120 ml   Filed Weights   11/18/20 0500 11/19/20 0500 11/20/20 0500  Weight: 128.4 kg 126.8 kg 126.2 kg    Examination: General: No acute respiratory distress Lungs: CTA B  Cardiovascular: RRR Abdomen: Protuberant, soft, BS+ Extremities: trace edema B LE   CBC: Recent Labs  Lab 11/19/20 0145 11/20/20 0201 11/21/20 0231  WBC 13.6* 11.6* 11.0*  NEUTROABS 9.1* 7.0 6.6  HGB 13.2 12.6 12.2  HCT 43.6 39.5 38.9  MCV 89.2 87.0 87.8  PLT 399 363 [id number]   Basic Metabolic Panel: Recent Labs  Lab 11/18/20 0251 11/19/20 0145 11/20/20 0201 11/21/20 0231  NA 133* 134* 133* 134*  K 4.6 4.8 4.2 4.3  CL 96* 93* 96* 98  CO2 27 33* 29 29  GLUCOSE 253* 201* 277* 234*  BUN 20 25* 28* 24*  CREATININE 1.16* 1.29* 1.22* 1.09*  CALCIUM 9.9 10.4* 9.7 10.0  MG 1.9 2.1 1.9  --   PHOS 2.2* 2.4* 3.0  --    GFR: Estimated Creatinine Clearance: 68.6 mL/min (A) (by C-G formula based on SCr of 1.09 mg/dL (H)).  Liver Function Tests: Recent Labs  Lab 11/18/20 0251 11/19/20 0145 11/20/20 0201 11/21/20 0231  AST 12* 14* 15 13*  ALT 12 12 13 13   ALKPHOS 80 80 79 72  BILITOT 0.7 0.3 0.3 0.4  PROT 6.6 6.8 6.3* 6.1*  ALBUMIN 2.2* 2.4* 2.3* 2.2*    Coagulation Profile: No results for  input(s): INR, PROTIME in the last 168 hours.  HbA1C: Hgb A1c MFr Bld  Date/Time Value Ref Range Status  11/14/2020 09:39 PM 8.5 (H) 4.8 - 5.6 % Final    Comment:    (NOTE) Pre diabetes:          5.7%-6.4%  Diabetes:              >6.4%  Glycemic control for   <7.0% adults with diabetes   04/29/2016 10:40 AM 5.6 4.8 - 5.6 % Final    Comment:    (NOTE)         Pre-diabetes: 5.7 - 6.4         Diabetes: >6.4         Glycemic control for adults with diabetes: <7.0     CBG: Recent Labs  Lab 11/22/20 0726 11/22/20 1204 11/22/20 1642 11/22/20 2054 11/23/20 0653  GLUCAP 194* 246* 259* 229* 294*    Recent Results (from the past 240 hour(s))  Resp Panel by RT-PCR (Flu A&B, Covid) Nasopharyngeal Swab     Status: None   Collection Time: 11/14/20  2:44 PM   Specimen: Nasopharyngeal Swab; Nasopharyngeal(NP) swabs in vial transport medium  Result Value Ref Range Status   SARS Coronavirus 2 by RT PCR NEGATIVE NEGATIVE Final    Comment: (NOTE) SARS-CoV-2 target nucleic acids are NOT DETECTED.  The SARS-CoV-2 RNA is generally detectable in upper respiratory specimens during the acute phase of infection. The lowest concentration of SARS-CoV-2 viral copies this assay can detect is 138 copies/mL. A negative result does not preclude SARS-Cov-2 infection and should not be used as the sole basis for treatment or other patient management decisions. A negative result may occur with  improper specimen collection/handling, submission of specimen other than nasopharyngeal swab, presence of viral mutation(s) within the areas targeted by this assay, and inadequate number of viral copies(<138 copies/mL). A negative result must be combined with clinical observations, patient history, and epidemiological information. The expected result is Negative.  Fact Sheet for Patients:  EntrepreneurPulse.com.au  Fact Sheet for Healthcare Providers:   IncredibleEmployment.be  This test is no t yet approved or cleared by the [place] FDA and  has been authorized for detection and/or diagnosis of SARS-CoV-2 by FDA under an Emergency Use Authorization (EUA). This EUA will remain  in effect (meaning this test can be used) for the duration of the COVID-19 declaration under Section 564(b)(1) of the Act, 21 U.S.C.section 360bbb-3(b)(1), unless the authorization is terminated  or revoked sooner.       Influenza A by PCR NEGATIVE NEGATIVE Final   Influenza B by PCR NEGATIVE NEGATIVE Final    Comment: (NOTE) The Xpert Xpress SARS-CoV-2/FLU/RSV plus assay is intended as an aid in the diagnosis of influenza from Nasopharyngeal swab specimens and should not be used as a sole basis for  treatment. Nasal washings and aspirates are unacceptable for Xpert Xpress SARS-CoV-2/FLU/RSV testing.  Fact Sheet for Patients: BloggerCourse.com  Fact Sheet for Healthcare Providers: SeriousBroker.it  This test is not yet approved or cleared by the [place] FDA and has been authorized for detection and/or diagnosis of SARS-CoV-2 by FDA under an Emergency Use Authorization (EUA). This EUA will remain in effect (meaning this test can be used) for the duration of the COVID-19 declaration under Section 564(b)(1) of the Act, 21 U.S.C. section 360bbb-3(b)(1), unless the authorization is terminated or revoked.  Performed at [facility], [address], [place], [place] [address]   Surgical PCR screen     Status: Abnormal   Collection Time: 11/15/20  5:37 AM   Specimen: Nasal Mucosa; Nasal Swab  Result Value Ref Range Status   MRSA, PCR POSITIVE (A) NEGATIVE Final    Comment: RESULT CALLED TO, READ BACK BY AND VERIFIED WITH: Redmond Baseman RN 9:45 11/15/20 ([email or web address])    Staphylococcus aureus POSITIVE (A) NEGATIVE Final    Comment: (NOTE) The Xpert SA Assay (FDA approved for  NASAL specimens in patients 65 years of age and older), is one component of a comprehensive surveillance program. It is not intended to diagnose infection nor to guide or monitor treatment. Performed at [facility], [address] [place], [place] [address]      Scheduled Meds: . acetaminophen  1,000 mg Oral Q6H  . apixaban  2.5 mg Oral BID  . bisoprolol  5 mg Oral Daily  . docusate sodium  100 mg Oral BID  . insulin aspart  0-15 Units Subcutaneous TID WC  . insulin aspart  0-5 Units Subcutaneous QHS  . insulin glargine  28 Units Subcutaneous Daily  . pantoprazole  20 mg Oral Daily  . rosuvastatin  10 mg Oral Daily   Continuous Infusions: . methocarbamol (ROBAXIN) IV 500 mg (11/17/20 1207)     LOS: 9 days   Lonia Blood, MD [facility] Office  [phone] Pager - Text Page per Amion  If 7PM-7AM, please contact night-coverage per Amion 11/23/2020, 9:19 AM

## 2020-11-23 NOTE — TOC Progression Note (Signed)
Transition of Care [facility]) - Progression Note    Patient Details  Name: [name] MRN: [id number] Date of Birth: October 26, 1956  [facility] CM/SW Contact  Levada Schilling Phone Number: 11/23/2020, 9:39 AM  Clinical Narrative:    No current bed offers for disposition to SNF.  TOC Team will continue to assist with disposition planning.   Expected Discharge Plan: Skilled Nursing Facility Barriers to Discharge: SNF Pending bed offer,Continued Medical Work up  Expected Discharge Plan and Services Expected Discharge Plan: Skilled Nursing Facility       Living arrangements for the past 2 months: Single Family Home Expected Discharge Date: 11/23/20                                     Social Determinants of Health (SDOH) Interventions    Readmission Risk Interventions No flowsheet data found.

## 2020-11-23 NOTE — Hospital Course (Signed)
65 year old with a history of panic attacks, chronic diastolic CHF, mitral valve repair with annuloplasty on chronic Eliquis, endocarditis, COPD, pulmonary hypertension, subarachnoid hemorrhage, CVA, DM2 with hyperglycemia, splenic infarction, CKD stage IIIa, and obesity who presented to the ED after a mechanical fall at home.  After her fall she began to experience significant left hip pain and presented to the ED for evaluation.  X-rays noted an intertrochanteric femur fracture on the left in the ED but fortunately no other acute findings were appreciated.

## 2020-11-23 NOTE — Plan of Care (Signed)

## 2020-11-24 DIAGNOSIS — N1831 Chronic kidney disease, stage 3a: Secondary | ICD-10-CM | POA: Diagnosis not present

## 2020-11-24 DIAGNOSIS — J431 Panlobular emphysema: Secondary | ICD-10-CM | POA: Diagnosis not present

## 2020-11-24 DIAGNOSIS — I10 Essential (primary) hypertension: Secondary | ICD-10-CM | POA: Diagnosis not present

## 2020-11-24 DIAGNOSIS — N179 Acute kidney failure, unspecified: Secondary | ICD-10-CM | POA: Diagnosis not present

## 2020-11-24 LAB — GLUCOSE, CAPILLARY
Glucose-Capillary: 184 mg/dL — ABNORMAL HIGH (ref 70–99)
Glucose-Capillary: 250 mg/dL — ABNORMAL HIGH (ref 70–99)
Glucose-Capillary: 284 mg/dL — ABNORMAL HIGH (ref 70–99)
Glucose-Capillary: 267 mg/dL — ABNORMAL HIGH (ref 70–99)

## 2020-11-24 MED ORDER — BUMETANIDE 1 MG PO TABS
1.0000 mg | ORAL_TABLET | Freq: Every day | ORAL | Status: DC
  Administered 2020-11-24 – 2020-11-28 (×5): 1 mg via ORAL
  Filled 2020-11-24 (×5): qty 1

## 2020-11-24 MED ORDER — INSULIN GLARGINE 100 UNIT/ML ~~LOC~~ SOLN
42.0000 [IU] | Freq: Every day | SUBCUTANEOUS | Status: DC
  Administered 2020-11-25 – 2020-11-26 (×2): 42 [IU] via SUBCUTANEOUS
  Filled 2020-11-24 (×2): qty 0.42

## 2020-11-24 MED ORDER — BISOPROLOL FUMARATE 10 MG PO TABS
10.0000 mg | ORAL_TABLET | Freq: Every day | ORAL | Status: DC
  Administered 2020-11-25 – 2020-11-28 (×4): 10 mg via ORAL
  Filled 2020-11-24 (×4): qty 1

## 2020-11-24 NOTE — Progress Notes (Signed)
Margaret [name]  [id number] DOB: [birth year] DOA: 11/14/2020 PCP: Patient, No Pcp Per    Brief Narrative:  65 year old with a history of panic attacks, chronic diastolic CHF, mitral valve repair with annuloplasty on chronic Eliquis, endocarditis, COPD, pulmonary hypertension, subarachnoid hemorrhage, CVA, DM2 with hyperglycemia, splenic infarction, CKD stage IIIa, and obesity who presented to the ED after a mechanical fall at home.  After her fall she began to experience significant left hip pain and presented to the ED for evaluation.  X-rays noted an intertrochanteric femur fracture on the left in the ED but fortunately no other acute findings were appreciated.  Significant Events:  12/23 admit via [facility] with left hip fracture 12/26 operative repair with left hip nail  Antimicrobials:  None  DVT prophylaxis: Eliquis  Subjective: Afebrile.  Vital signs stable.  Still awaiting SNF placement with patient medically stable for discharge.  No new complaints today.  Assessment & Plan:  Left intertrochanteric femur fracture after mechanical fall Care per Orthopedics -status post left hip IM nail - working w/ PT/OT - cleared for discharge from orthopedic standpoint -requires SNF stay as attested to by PT on repeat evaluation [id number]  Chronic diastolic CHF Continue beta-blocker and diuretic -no clinical overload  HTN adjust treatment regimen today  Prior mitral valve repair Eliquis continues  COPD Does not require home O2 -well compensated  Prior CVA with left-sided residual weakness For SNF   Uncontrolled DM2 with hyperglycemia A1c 8.5 -CBG not yet at goal -changes made in treatment regimen again today -ongoing outpatient follow-up indicated  Acute kidney injury on CKD stage IIIa Baseline creatinine approximately 1.2 -creatinine presently better than her baseline/stable  HLD Continue usual Crestor  Morbid obesity - Body mass index is 47.73 kg/m.    Code Status:  FULL CODE Family Communication: No family present at time of exam today Status is: Inpatient  Remains inpatient appropriate because:Unsafe d/c plan   Dispo: The patient is from: Home              Anticipated d/c is to: SNF              Anticipated d/c date is: 1 day              Patient currently is medically stable to d/c.   Consultants:  Ortho  Objective: Blood pressure (!) 139/57, pulse 63, temperature 98.1 F (36.7 C), temperature source Oral, resp. rate 17, height 5' 4.02" (1.626 m), weight 126.2 kg, SpO2 100 %.  Intake/Output Summary (Last 24 hours) at 11/24/2020 0953 Last data filed at 11/23/2020 1300 Gross per 24 hour  Intake 240 ml  Output 600 ml  Net -360 ml   Filed Weights   11/18/20 0500 11/19/20 0500 11/20/20 0500  Weight: 128.4 kg 126.8 kg 126.2 kg    Examination: General: No acute respiratory distress Lungs: CTA B - no wheeze  Cardiovascular: RRR Abdomen: Protuberant, soft, bowel sounds positive Extremities: trace edema B LE without change  CBC: Recent Labs  Lab 11/19/20 0145 11/20/20 0201 11/21/20 0231  WBC 13.6* 11.6* 11.0*  NEUTROABS 9.1* 7.0 6.6  HGB 13.2 12.6 12.2  HCT 43.6 39.5 38.9  MCV 89.2 87.0 87.8  PLT 399 363 [id number]   Basic Metabolic Panel: Recent Labs  Lab 11/18/20 0251 11/19/20 0145 11/20/20 0201 11/21/20 0231  NA 133* 134* 133* 134*  K 4.6 4.8 4.2 4.3  CL 96* 93* 96* 98  CO2 27 33* 29 29  GLUCOSE 253* 201* 277* 234*  BUN 20 25* 28* 24*  CREATININE 1.16* 1.29* 1.22* 1.09*  CALCIUM 9.9 10.4* 9.7 10.0  MG 1.9 2.1 1.9  --   PHOS 2.2* 2.4* 3.0  --    GFR: Estimated Creatinine Clearance: 68.6 mL/min (A) (by C-G formula based on SCr of 1.09 mg/dL (H)).  Liver Function Tests: Recent Labs  Lab 11/18/20 0251 11/19/20 0145 11/20/20 0201 11/21/20 0231  AST 12* 14* 15 13*  ALT 12 12 13 13   ALKPHOS 80 80 79 72  BILITOT 0.7 0.3 0.3 0.4  PROT 6.6 6.8 6.3* 6.1*  ALBUMIN 2.2* 2.4* 2.3* 2.2*    HbA1C: Hgb A1c MFr Bld   Date/Time Value Ref Range Status  11/14/2020 09:39 PM 8.5 (H) 4.8 - 5.6 % Final    Comment:    (NOTE) Pre diabetes:          5.7%-6.4%  Diabetes:              >6.4%  Glycemic control for   <7.0% adults with diabetes   04/29/2016 10:40 AM 5.6 4.8 - 5.6 % Final    Comment:    (NOTE)         Pre-diabetes: 5.7 - 6.4         Diabetes: >6.4         Glycemic control for adults with diabetes: <7.0     CBG: Recent Labs  Lab 11/23/20 0653 11/23/20 1132 11/23/20 1646 11/23/20 2147 11/24/20 0817  GLUCAP 294* 249* 232* 290* 184*    Recent Results (from the past 240 hour(s))  Resp Panel by RT-PCR (Flu A&B, Covid) Nasopharyngeal Swab     Status: None   Collection Time: 11/14/20  2:44 PM   Specimen: Nasopharyngeal Swab; Nasopharyngeal(NP) swabs in vial transport medium  Result Value Ref Range Status   SARS Coronavirus 2 by RT PCR NEGATIVE NEGATIVE Final    Comment: (NOTE) SARS-CoV-2 target nucleic acids are NOT DETECTED.  The SARS-CoV-2 RNA is generally detectable in upper respiratory specimens during the acute phase of infection. The lowest concentration of SARS-CoV-2 viral copies this assay can detect is 138 copies/mL. A negative result does not preclude SARS-Cov-2 infection and should not be used as the sole basis for treatment or other patient management decisions. A negative result may occur with  improper specimen collection/handling, submission of specimen other than nasopharyngeal swab, presence of viral mutation(s) within the areas targeted by this assay, and inadequate number of viral copies(<138 copies/mL). A negative result must be combined with clinical observations, patient history, and epidemiological information. The expected result is Negative.  Fact Sheet for Patients:  11/16/20  Fact Sheet for Healthcare Providers:  BloggerCourse.com  This test is no t yet approved or cleared by the [employer]  FDA and  has been authorized for detection and/or diagnosis of SARS-CoV-2 by FDA under an Emergency Use Authorization (EUA). This EUA will remain  in effect (meaning this test can be used) for the duration of the COVID-19 declaration under Section 564(b)(1) of the Act, 21 U.S.C.section 360bbb-3(b)(1), unless the authorization is terminated  or revoked sooner.       Influenza A by PCR NEGATIVE NEGATIVE Final   Influenza B by PCR NEGATIVE NEGATIVE Final    Comment: (NOTE) The Xpert Xpress SARS-CoV-2/FLU/RSV plus assay is intended as an aid in the diagnosis of influenza from Nasopharyngeal swab specimens and should not be used as a sole basis for treatment. Nasal washings and aspirates are unacceptable for Xpert Xpress SARS-CoV-2/FLU/RSV testing.  Fact  Sheet for Patients: EntrepreneurPulse.com.au  Fact Sheet for Healthcare Providers: IncredibleEmployment.be  This test is not yet approved or cleared by the [place] FDA and has been authorized for detection and/or diagnosis of SARS-CoV-2 by FDA under an Emergency Use Authorization (EUA). This EUA will remain in effect (meaning this test can be used) for the duration of the COVID-19 declaration under Section 564(b)(1) of the Act, 21 U.S.C. section 360bbb-3(b)(1), unless the authorization is terminated or revoked.  Performed at [facility], [place] [address], [place], [place] [address]   Surgical PCR screen     Status: Abnormal   Collection Time: 11/15/20  5:37 AM   Specimen: Nasal Mucosa; Nasal Swab  Result Value Ref Range Status   MRSA, PCR POSITIVE (A) NEGATIVE Final    Comment: RESULT CALLED TO, READ BACK BY AND VERIFIED WITH: Eulogio Ditch RN 9:45 11/15/20 ([email or web address])    Staphylococcus aureus POSITIVE (A) NEGATIVE Final    Comment: (NOTE) The Xpert SA Assay (FDA approved for NASAL specimens in patients 32 years of age and older), is one component of a comprehensive surveillance  program. It is not intended to diagnose infection nor to guide or monitor treatment. Performed at [facility], [place] [address], [place],  [address]      Scheduled Meds: . acetaminophen  1,000 mg Oral Q6H  . apixaban  2.5 mg Oral BID  . bisoprolol  5 mg Oral Daily  . docusate sodium  100 mg Oral BID  . insulin aspart  0-20 Units Subcutaneous TID WC  . insulin aspart  0-5 Units Subcutaneous QHS  . insulin glargine  32 Units Subcutaneous Daily  . pantoprazole  20 mg Oral Daily  . rosuvastatin  10 mg Oral Daily   Continuous Infusions: . methocarbamol (ROBAXIN) IV 500 mg (11/17/20 1207)     LOS: 10 days   Cherene Altes, MD [facility] Office  [phone] Pager - Text Page per Amion  If 7PM-7AM, please contact night-coverage per Amion 11/24/2020, 9:53 AM

## 2020-11-24 NOTE — Plan of Care (Signed)

## 2020-11-25 LAB — CBC
HCT: 41.8 % (ref 36.0–46.0)
Hemoglobin: 12.9 g/dL (ref 12.0–15.0)
MCH: 27 pg (ref 26.0–34.0)
MCHC: 30.9 g/dL (ref 30.0–36.0)
MCV: 87.4 fL (ref 80.0–100.0)
Platelets: 461 10*3/uL — ABNORMAL HIGH (ref 150–400)
RBC: 4.78 MIL/uL (ref 3.87–5.11)
RDW: 16.3 % — ABNORMAL HIGH (ref 11.5–15.5)
WBC: 12.6 10*3/uL — ABNORMAL HIGH (ref 4.0–10.5)
nRBC: 0.2 % (ref 0.0–0.2)

## 2020-11-25 LAB — BASIC METABOLIC PANEL
Anion gap: 13 (ref 5–15)
BUN: 25 mg/dL — ABNORMAL HIGH (ref 8–23)
CO2: 24 mmol/L (ref 22–32)
Calcium: 10.3 mg/dL (ref 8.9–10.3)
Chloride: 96 mmol/L — ABNORMAL LOW (ref 98–111)
Creatinine, Ser: 1.25 mg/dL — ABNORMAL HIGH (ref 0.44–1.00)
GFR, Estimated: 48 mL/min — ABNORMAL LOW (ref >60)
Glucose, Bld: 229 mg/dL — ABNORMAL HIGH (ref 70–99)
Potassium: 4.4 mmol/L (ref 3.5–5.1)
Sodium: 133 mmol/L — ABNORMAL LOW (ref 135–145)

## 2020-11-25 LAB — GLUCOSE, CAPILLARY
Glucose-Capillary: 233 mg/dL — ABNORMAL HIGH (ref 70–99)
Glucose-Capillary: 241 mg/dL — ABNORMAL HIGH (ref 70–99)
Glucose-Capillary: 215 mg/dL — ABNORMAL HIGH (ref 70–99)
Glucose-Capillary: 263 mg/dL — ABNORMAL HIGH (ref 70–99)

## 2020-11-25 MED ORDER — ZOLPIDEM TARTRATE 5 MG PO TABS
5.0000 mg | ORAL_TABLET | Freq: Every evening | ORAL | Status: DC | PRN
  Administered 2020-11-26: 5 mg via ORAL
  Filled 2020-11-25 (×2): qty 1

## 2020-11-25 MED ORDER — ALPRAZOLAM 0.25 MG PO TABS
0.2500 mg | ORAL_TABLET | Freq: Three times a day (TID) | ORAL | Status: DC | PRN
  Administered 2020-11-26 – 2020-11-27 (×3): 0.25 mg via ORAL
  Filled 2020-11-25 (×4): qty 1

## 2020-11-25 MED ORDER — LISDEXAMFETAMINE DIMESYLATE 30 MG PO CAPS
30.0000 mg | ORAL_CAPSULE | Freq: Every day | ORAL | Status: DC
  Administered 2020-11-25 – 2020-11-28 (×4): 30 mg via ORAL
  Filled 2020-11-25 (×4): qty 1

## 2020-11-25 NOTE — Plan of Care (Signed)
  Problem: Activity: Goal: Risk for activity intolerance will decrease Outcome: Progressing   Problem: Coping: Goal: Level of anxiety will decrease Outcome: Progressing   Problem: Pain Managment: Goal: General experience of comfort will improve Outcome: Progressing   Problem: Safety: Goal: Ability to remain free from injury will improve Outcome: Progressing   Problem: Skin Integrity: Goal: Risk for impaired skin integrity will decrease Outcome: Progressing   

## 2020-11-25 NOTE — Progress Notes (Signed)
Physical Therapy Treatment Patient Details Name: Margaret [name] MRN: [id number] DOB: 1956-08-08 Today's Date: 11/25/2020    History of Present Illness Margaret [name] is a 65 y.o. female with past medical history significant for endocarditis, CHF, COPD, obesity, pulmonary hypertension, valve repair, subarachnoid bleed, CVA, diabetes who presents for evaluation after fall. She had a left sided weakness after prior stroke. She just discontinued using a wheelchair in August of this year. She normally ambulates with a cane. She fell yesterday and had immediate pain in her left hip with inability to bear weight. Orthopedics was consulted for left femur fracture and underwent IM nailing on 12/26    PT Comments    Pt supine in bed on arrival this session and very motivated to move.  Focused on LLE strengthening and functional mobility progression.  Pt presents with LOB during sitting and standing to the R.  Sitting balance is much improved once scooted to edge of bed and feet grounded.  Pt performed sit to standing into sara stedy with LOB to the R in standing.  Continue to recommend snf placement post acutely to address balance and strength deficits.     Follow Up Recommendations  SNF     Equipment Recommendations  None recommended by PT    Recommendations for Other Services       Precautions / Restrictions Precautions Precautions: Fall;Other (comment) Precaution Comments: L sided hemiparesis from previous CVA Restrictions Weight Bearing Restrictions: No LLE Weight Bearing: Weight bearing as tolerated    Mobility  Bed Mobility Overal bed mobility: Needs Assistance Bed Mobility: Supine to Sit     Supine to sit: Mod assist     General bed mobility comments: Assistance for B LEs to edge of bed and utilized PTA as railing to pull trunk into sitting. LOB to R required min assistance to correct once in sitting.  Transfers Overall transfer level: Needs assistance Equipment  used: Ambulation equipment used (sara stedy) Transfers: Sit to/from Stand Sit to Stand: From elevated surface;Mod assist        Lateral/Scoot Transfers: Total assist (on sara stedy) General transfer comment: Assistance to manage L arm and assist with weight shifting forward into standing.  Mild LOB to the R.  Ambulation/Gait Ambulation/Gait assistance:  (NT)               Stairs             Wheelchair Mobility    Modified Rankin (Stroke Patients Only)       Balance Overall balance assessment: Needs assistance Sitting-balance support: Feet supported Sitting balance-Leahy Scale: Fair       Standing balance-Leahy Scale: Poor                              Cognition Arousal/Alertness: Awake/alert Behavior During Therapy: WFL for tasks assessed/performed Overall Cognitive Status: Within Functional Limits for tasks assessed                                        Exercises General Exercises - Lower Extremity Ankle Circles/Pumps: AROM;Both;10 reps;Supine Quad Sets: AROM;10 reps;Supine;Left Heel Slides: AAROM;10 reps;Left Hip ABduction/ADduction: AROM;Left;5 reps;Supine    General Comments        Pertinent Vitals/Pain Pain Assessment: Faces Faces Pain Scale: Hurts even more Pain Location: L hip/LE at end of session Pain Descriptors / Indicators: Guarding;Grimacing;Operative  site guarding Pain Intervention(s): Monitored during session;Repositioned    Home Living                      Prior Function            PT Goals (current goals can now be found in the care plan section) Acute Rehab PT Goals Patient Stated Goal: pain control, return to PLOF Potential to Achieve Goals: Fair Progress towards PT goals: Progressing toward goals    Frequency    Min 3X/week      PT Plan Current plan remains appropriate    Co-evaluation              AM-PAC PT "6 Clicks" Mobility   Outcome Measure  Help needed  turning from your back to your side while in a flat bed without using bedrails?: A Lot Help needed moving from lying on your back to sitting on the side of a flat bed without using bedrails?: A Lot Help needed moving to and from a bed to a chair (including a wheelchair)?: Total Help needed standing up from a chair using your arms (e.g., wheelchair or bedside chair)?: Total Help needed to walk in hospital room?: Total Help needed climbing 3-5 steps with a railing? : Total 6 Click Score: 8    End of Session Equipment Utilized During Treatment: Gait belt Activity Tolerance: Patient limited by fatigue;Patient limited by pain Patient left: with call bell/phone within reach;in chair;with chair alarm set Nurse Communication: Mobility status (need for sara stedy for back to bed.) PT Visit Diagnosis: Other abnormalities of gait and mobility (R26.89);Muscle weakness (generalized) (M62.81);Difficulty in walking, not elsewhere classified (R26.2);Pain;Hemiplegia and hemiparesis Hemiplegia - Right/Left: Left Hemiplegia - dominant/non-dominant: Non-dominant Hemiplegia - caused by: Cerebral infarction Pain - Right/Left: Left Pain - part of body: Hip;Leg     Time: 1610-9604 PT Time Calculation (min) (ACUTE ONLY): 33 min  Charges:  $Therapeutic Exercise: 8-22 mins $Therapeutic Activity: 8-22 mins                     Bonney Leitz , PTA Acute Rehabilitation Services Pager [phone] Office [phone]     Basya Casavant Artis Delay 11/25/2020, 6:37 PM

## 2020-11-25 NOTE — TOC Progression Note (Addendum)
Transition of Care [facility]) - Progression Note    Patient Details  Name: Margaret [name] MRN: [id number] Date of Birth: 1956/01/23  Transition of Care [facility]) CM/SW Contact  Mearl Latin, LCSW Phone Number: 11/25/2020, 9:40 AM  Clinical Narrative:    9am-CSW requested [facility], and [facility] review referral.   12pm-[facility] and [facility] unable to accept patient. [facility] likely able to accept patient, however, they are out of power and are not taking admissions until it is restored. CSW will request COVID test once [facility] is certain of admission date.    Expected Discharge Plan: Skilled Nursing Facility Barriers to Discharge: SNF Pending bed offer,Continued Medical Work up  Expected Discharge Plan and Services Expected Discharge Plan: Skilled Nursing Facility       Living arrangements for the past 2 months: Single Family Home Expected Discharge Date: 11/23/20                                     Social Determinants of Health (SDOH) Interventions    Readmission Risk Interventions No flowsheet data found.

## 2020-11-25 NOTE — Plan of Care (Signed)
  Problem: Education: Goal: Knowledge of General Education information will improve Description: Including pain rating scale, medication(s)/side effects and non-pharmacologic comfort measures Outcome: Progressing   Problem: Health Behavior/Discharge Planning: Goal: Ability to manage health-related needs will improve Outcome: Progressing   Problem: Clinical Measurements: Goal: Will remain free from infection Outcome: Progressing   

## 2020-11-25 NOTE — Progress Notes (Signed)
Margaret [name]  [id number] DOB: 01/05/1956 DOA: 11/14/2020 PCP: Patient, No Pcp Per    Brief Narrative:  65 year old with a history of panic attacks, chronic diastolic CHF, mitral valve repair with annuloplasty on chronic Eliquis, endocarditis, COPD, pulmonary hypertension, subarachnoid hemorrhage, CVA, DM2 with hyperglycemia, splenic infarction, CKD stage IIIa, and obesity who presented to the ED after a mechanical fall at home.  After her fall she began to experience significant left hip pain and presented to the ED for evaluation.  X-rays noted an intertrochanteric femur fracture on the left in the ED but fortunately no other acute findings were appreciated.  Significant Events:  12/23 admit via [facility] with left hip fracture 12/26 operative repair with left hip nail  Antimicrobials:  None  DVT prophylaxis: Eliquis  Subjective: Vital signs are stable. Patient is afebrile. Saturations 98% on room air.  Reports that she had significant difficulty with anxiety last night.  No new complaints otherwise.  Assessment & Plan:  Left intertrochanteric femur fracture after mechanical fall Care per Orthopedics -status post left hip IM nail - working w/ PT/OT - cleared for discharge from orthopedic standpoint -requires SNF stay as attested to by PT on repeat evaluation 12/30 -awaiting SNF bed -medically clear for discharge  Chronic diastolic CHF Continue beta-blocker and diuretic -no clinical overload  HTN adjust treatment regimen today  Prior mitral valve repair Eliquis continues  COPD Does not require home O2 -well compensated  Prior CVA with left-sided residual weakness For SNF   Uncontrolled DM2 with hyperglycemia A1c 8.5 -CBG not yet at goal -monitor with changes made in treatment regimen yesterday afternoon  Acute kidney injury on CKD stage IIIa Baseline creatinine approximately 1.2 -creatinine presently at baseline  HLD Continue usual Crestor  Morbid obesity -  Body mass index is 47.73 kg/m.  Intermittent situational anxiety Resume usual home medical therapy and augment with very low-dose Xanax as needed for short course only  Code Status: FULL CODE Family Communication: No family present at time of exam today Status is: Inpatient  Remains inpatient appropriate because:Unsafe d/c plan   Dispo: The patient is from: Home              Anticipated d/c is to: SNF              Anticipated d/c date is: 1 day              Patient currently is medically stable to d/c.   Consultants:  Ortho  Objective: Blood pressure (!) 118/46, pulse 65, temperature 97.7 F (36.5 C), temperature source Oral, resp. rate 18, height 5' 4.02" (1.626 m), weight 126.2 kg, SpO2 98 %.  Intake/Output Summary (Last 24 hours) at 11/25/2020 1133 Last data filed at 11/25/2020 1100 Gross per 24 hour  Intake 480 ml  Output 3100 ml  Net -2620 ml   Filed Weights   11/18/20 0500 11/19/20 0500 11/20/20 0500  Weight: 128.4 kg 126.8 kg 126.2 kg    Examination: General: No acute respiratory distress Lungs: CTA B  Cardiovascular: RRR without murmur Abdomen: Protuberant, soft, bowel sounds positive Extremities: trace edema B LE   CBC: Recent Labs  Lab 11/19/20 0145 11/20/20 0201 11/21/20 0231 11/25/20 0353  WBC 13.6* 11.6* 11.0* 12.6*  NEUTROABS 9.1* 7.0 6.6  --   HGB 13.2 12.6 12.2 12.9  HCT 43.6 39.5 38.9 41.8  MCV 89.2 87.0 87.8 87.4  PLT 399 363 367 [id number]*   Basic Metabolic Panel: Recent Labs  Lab 11/19/20  0145 11/20/20 0201 11/21/20 0231 11/25/20 0353  NA 134* 133* 134* 133*  K 4.8 4.2 4.3 4.4  CL 93* 96* 98 96*  CO2 33* 29 29 24   GLUCOSE 201* 277* 234* 229*  BUN 25* 28* 24* 25*  CREATININE 1.29* 1.22* 1.09* 1.25*  CALCIUM 10.4* 9.7 10.0 10.3  MG 2.1 1.9  --   --   PHOS 2.4* 3.0  --   --    GFR: Estimated Creatinine Clearance: 59.8 mL/min (A) (by C-G formula based on SCr of 1.25 mg/dL (H)).  Liver Function Tests: Recent Labs  Lab  11/19/20 0145 11/20/20 0201 11/21/20 0231  AST 14* 15 13*  ALT 12 13 13   ALKPHOS 80 79 72  BILITOT 0.3 0.3 0.4  PROT 6.8 6.3* 6.1*  ALBUMIN 2.4* 2.3* 2.2*    HbA1C: Hgb A1c MFr Bld  Date/Time Value Ref Range Status  11/14/2020 09:39 PM 8.5 (H) 4.8 - 5.6 % Final    Comment:    (NOTE) Pre diabetes:          5.7%-6.4%  Diabetes:              >6.4%  Glycemic control for   <7.0% adults with diabetes   04/29/2016 10:40 AM 5.6 4.8 - 5.6 % Final    Comment:    (NOTE)         Pre-diabetes: 5.7 - 6.4         Diabetes: >6.4         Glycemic control for adults with diabetes: <7.0     CBG: Recent Labs  Lab 11/24/20 1149 11/24/20 1652 11/24/20 1940 11/25/20 0702 11/25/20 1120  GLUCAP 250* 284* 267* 233* 241*    No results found for this or any previous visit (from the past 240 hour(s)).   Scheduled Meds: . acetaminophen  1,000 mg Oral Q6H  . apixaban  2.5 mg Oral BID  . bisoprolol  10 mg Oral Daily  . bumetanide  1 mg Oral Daily  . docusate sodium  100 mg Oral BID  . insulin aspart  0-20 Units Subcutaneous TID WC  . insulin aspart  0-5 Units Subcutaneous QHS  . insulin glargine  42 Units Subcutaneous Daily  . pantoprazole  20 mg Oral Daily  . rosuvastatin  10 mg Oral Daily   Continuous Infusions: . methocarbamol (ROBAXIN) IV 500 mg (11/17/20 1207)     LOS: 11 days   11/19/20, MD [facility] Office  [phone] Pager - Text Page per Amion  If 7PM-7AM, please contact night-coverage per Amion 11/25/2020, 11:33 AM

## 2020-11-25 NOTE — Plan of Care (Signed)

## 2020-11-26 LAB — GLUCOSE, CAPILLARY
Glucose-Capillary: 170 mg/dL — ABNORMAL HIGH (ref 70–99)
Glucose-Capillary: 274 mg/dL — ABNORMAL HIGH (ref 70–99)
Glucose-Capillary: 158 mg/dL — ABNORMAL HIGH (ref 70–99)
Glucose-Capillary: 216 mg/dL — ABNORMAL HIGH (ref 70–99)

## 2020-11-26 MED ORDER — METHOCARBAMOL 500 MG PO TABS: 500.0000 mg | ORAL_TABLET | Freq: Three times a day (TID) | ORAL | Status: DC | PRN

## 2020-11-26 MED ORDER — INSULIN GLARGINE 100 UNIT/ML ~~LOC~~ SOLN
48.0000 [IU] | Freq: Every day | SUBCUTANEOUS | Status: DC
  Administered 2020-11-27: 48 [IU] via SUBCUTANEOUS
  Filled 2020-11-26: qty 0.48

## 2020-11-26 NOTE — Progress Notes (Signed)
Margaret [name]  DGL:[id number] DOB: 1956/03/05 DOA: 11/14/2020 PCP: Patient, No Pcp Per    Brief Narrative:  65 year old with a history of panic attacks, chronic diastolic CHF, mitral valve repair with annuloplasty on chronic Eliquis, endocarditis, COPD, pulmonary hypertension, subarachnoid hemorrhage, CVA, DM2 with hyperglycemia, splenic infarction, CKD stage IIIa, and obesity who presented to the ED after a mechanical fall at home.  After her fall she began to experience significant left hip pain and presented to the ED for evaluation.  X-rays noted an intertrochanteric femur fracture on the left in the ED but fortunately no other acute findings were appreciated.  Significant Events:  12/23 admit via [place] with left hip fracture 12/26 operative repair with left hip nail  Antimicrobials:  None  DVT prophylaxis: Eliquis  Subjective: Remains medically stable.  Is awaiting placement in an SNF for a rehab stay.  Assessment & Plan:  Left intertrochanteric femur fracture after mechanical fall Care per Orthopedics -status post left hip IM nail - working w/ PT/OT - cleared for discharge from orthopedic standpoint -requires SNF stay as attested to by PT on repeat evaluation 12/30 -awaiting SNF bed -medically clear for discharge  Chronic diastolic CHF Continue beta-blocker and diuretic -no clinical overload  HTN Blood pressure improved -continue to follow  Prior mitral valve repair Eliquis continues  COPD Does not require home O2 -well compensated  Prior CVA with left-sided residual weakness For SNF   Uncontrolled DM2 with hyperglycemia A1c 8.5 -CBG remains elevated above goal -adjust treatment strategy again today  Acute kidney injury on CKD stage IIIa Baseline creatinine approximately 1.2 -creatinine presently at baseline  HLD Continue usual Crestor  Morbid obesity - Body mass index is 47.73 kg/m.  Intermittent situational anxiety Resumed usual home medical  therapy and augment with very low-dose Xanax as needed for short course only  Code Status: FULL CODE Family Communication: No family present at time of exam today Status is: Inpatient  Remains inpatient appropriate because:Unsafe d/c plan   Dispo: The patient is from: Home              Anticipated d/c is to: SNF              Anticipated d/c date is: 1 day              Patient currently is medically stable to d/c.   Consultants:  Ortho  Objective: Blood pressure (!) 121/38, pulse 66, temperature (!) 97.5 F (36.4 C), temperature source Oral, resp. rate 17, height 5' 4.02" (1.626 m), weight 126.2 kg, SpO2 95 %.  Intake/Output Summary (Last 24 hours) at 11/26/2020 1202 Last data filed at 11/26/2020 0900 Gross per 24 hour  Intake 480 ml  Output 1350 ml  Net -870 ml   Filed Weights   11/18/20 0500 11/19/20 0500 11/20/20 0500  Weight: 128.4 kg 126.8 kg 126.2 kg    Examination: General: No acute respiratory distress Lungs: CTA B  Cardiovascular: RRR  Abdomen: Protuberant, soft, bowel sounds + Extremities: trace edema B LE   CBC: Recent Labs  Lab 11/20/20 0201 11/21/20 0231 11/25/20 0353  WBC 11.6* 11.0* 12.6*  NEUTROABS 7.0 6.6  --   HGB 12.6 12.2 12.9  HCT 39.5 38.9 41.8  MCV 87.0 87.8 87.4  PLT 363 367 461*   Basic Metabolic Panel: Recent Labs  Lab 11/20/20 0201 11/21/20 0231 11/25/20 0353  NA 133* 134* 133*  K 4.2 4.3 4.4  CL 96* 98 96*  CO2 29 29  24  GLUCOSE 277* 234* 229*  BUN 28* 24* 25*  CREATININE 1.22* 1.09* 1.25*  CALCIUM 9.7 10.0 10.3  MG 1.9  --   --   PHOS 3.0  --   --    GFR: Estimated Creatinine Clearance: 59.8 mL/min (A) (by C-G formula based on SCr of 1.25 mg/dL (H)).  Liver Function Tests: Recent Labs  Lab 11/20/20 0201 11/21/20 0231  AST 15 13*  ALT 13 13  ALKPHOS 79 72  BILITOT 0.3 0.4  PROT 6.3* 6.1*  ALBUMIN 2.3* 2.2*    HbA1C: Hgb A1c MFr Bld  Date/Time Value Ref Range Status  11/14/2020 09:39 PM 8.5 (H) 4.8 - 5.6 %  Final    Comment:    (NOTE) Pre diabetes:          5.7%-6.4%  Diabetes:              >6.4%  Glycemic control for   <7.0% adults with diabetes   04/29/2016 10:40 AM 5.6 4.8 - 5.6 % Final    Comment:    (NOTE)         Pre-diabetes: 5.7 - 6.4         Diabetes: >6.4         Glycemic control for adults with diabetes: <7.0     CBG: Recent Labs  Lab 11/25/20 1120 11/25/20 1615 11/25/20 2008 11/26/20 0704 11/26/20 1140  GLUCAP 241* 215* 263* 170* 274*     Scheduled Meds: . acetaminophen  1,000 mg Oral Q6H  . apixaban  2.5 mg Oral BID  . bisoprolol  10 mg Oral Daily  . bumetanide  1 mg Oral Daily  . docusate sodium  100 mg Oral BID  . insulin aspart  0-20 Units Subcutaneous TID WC  . insulin aspart  0-5 Units Subcutaneous QHS  . insulin glargine  42 Units Subcutaneous Daily  . lisdexamfetamine  30 mg Oral Daily  . pantoprazole  20 mg Oral Daily  . rosuvastatin  10 mg Oral Daily      LOS: 12 days   Cherene Altes, MD [facility] Office  [phone] Pager - Text Page per Amion  If 7PM-7AM, please contact night-coverage per Amion 11/26/2020, 12:02 PM

## 2020-11-26 NOTE — Discharge Instructions (Addendum)
You may bear weight as tolerated. Keep your dressing on and dry until follow up. Take medicine to prevent blood clots as directed. Take pain medicine as needed with the goal of transitioning to over the counter medicines.  If needed, you may increase breakthrough pain medication (oxycodone) for the first few days post op - up to 2 tablets every 4 hours.  Stop this medication as soon as you are able.  INSTRUCTIONS AFTER JOINT REPLACEMENT   o Remove items at home which could result in a fall. This includes throw rugs or furniture in walking pathways o ICE to the affected joint every three hours while awake for 30 minutes at a time, for at least the first 3-5 days, and then as needed for pain and swelling.  Continue to use ice for pain and swelling. You may notice swelling that will progress down to the foot and ankle.  This is normal after surgery.  Elevate your leg when you are not up walking on it.   o Continue to use the breathing machine you got in the hospital (incentive spirometer) which will help keep your temperature down.  It is common for your temperature to cycle up and down following surgery, especially at night when you are not up moving around and exerting yourself.  The breathing machine keeps your lungs expanded and your temperature down.   DIET:  As you were doing prior to hospitalization, we recommend a well-balanced diet.  DRESSING / WOUND CARE / SHOWERING  You may shower 3 days after surgery, but keep the wounds dry during showering.  You may use an occlusive plastic wrap (Press'n Seal for example) with blue painter's tape at edges, NO SOAKING/SUBMERGING IN THE BATHTUB.  If the bandage gets wet, change with a clean dry gauze.  If the incision gets wet, pat the wound dry with a clean towel.  ACTIVITY  o Increase activity slowly as tolerated, but follow the weight bearing instructions below.   o No driving for 6 weeks or until further direction given by your physician.  You  cannot drive while taking narcotics.  o No lifting or carrying greater than 10 lbs. until further directed by your surgeon. o Avoid periods of inactivity such as sitting longer than an hour when not asleep. This helps prevent blood clots.  o You may return to work once you are authorized by your doctor.     WEIGHT BEARING   Weight bearing as tolerated with assist device (walker, cane, etc) as directed, use it as long as suggested by your surgeon or therapist, typically at least 4-6 weeks.   EXERCISES  Results after joint replacement surgery are often greatly improved when you follow the exercise, range of motion and muscle strengthening exercises prescribed by your doctor. Safety measures are also important to protect the joint from further injury. Any time any of these exercises cause you to have increased pain or swelling, decrease what you are doing until you are comfortable again and then slowly increase them. If you have problems or questions, call your caregiver or physical therapist for advice.   Rehabilitation is important following a joint replacement. After just a few days of immobilization, the muscles of the leg can become weakened and shrink (atrophy).  These exercises are designed to build up the tone and strength of the thigh and leg muscles and to improve motion. Often times heat used for twenty to thirty minutes before working out will loosen up your tissues and help with   improving the range of motion but do not use heat for the first two weeks following surgery (sometimes heat can increase post-operative swelling).   These exercises can be done on a training (exercise) mat, on the floor, on a table or on a bed. Use whatever works the best and is most comfortable for you.    Use music or television while you are exercising so that the exercises are a pleasant break in your day. This will make your life better with the exercises acting as a break in your routine that you can look  forward to.   Perform all exercises about fifteen times, three times per day or as directed.  You should exercise both the operative leg and the other leg as well.  Exercises include:   . Quad Sets - Tighten up the muscle on the front of the thigh (Quad) and hold for 5-10 seconds.   . Straight Leg Raises - With your knee straight (if you were given a brace, keep it on), lift the leg to 60 degrees, hold for 3 seconds, and slowly lower the leg.  Perform this exercise against resistance later as your leg gets stronger.  . Leg Slides: Lying on your back, slowly slide your foot toward your buttocks, bending your knee up off the floor (only go as far as is comfortable). Then slowly slide your foot back down until your leg is flat on the floor again.  . Angel Wings: Lying on your back spread your legs to the side as far apart as you can without causing discomfort.  . Hamstring Strength:  Lying on your back, push your heel against the floor with your leg straight by tightening up the muscles of your buttocks.  Repeat, but this time bend your knee to a comfortable angle, and push your heel against the floor.  You may put a pillow under the heel to make it more comfortable if necessary.   A rehabilitation program following joint replacement surgery can speed recovery and prevent re-injury in the future due to weakened muscles. Contact your doctor or a physical therapist for more information on knee rehabilitation.    CONSTIPATION  Constipation is defined medically as fewer than three stools per week and severe constipation as less than one stool per week.  Even if you have a regular bowel pattern at home, your normal regimen is likely to be disrupted due to multiple reasons following surgery.  Combination of anesthesia, postoperative narcotics, change in appetite and fluid intake all can affect your bowels.   YOU MUST use at least one of the following options; they are listed in order of increasing strength  to get the job done.  They are all available over the counter, and you may need to use some, POSSIBLY even all of these options:    Drink plenty of fluids (prune juice may be helpful) and high fiber foods Colace 100 mg by mouth twice a day  Senokot for constipation as directed and as needed Dulcolax (bisacodyl), take with full glass of water  Miralax (polyethylene glycol) once or twice a day as needed.  If you have tried all these things and are unable to have a bowel movement in the first 3-4 days after surgery call either your surgeon or your primary doctor.    If you experience loose stools or diarrhea, hold the medications until you stool forms back up.  If your symptoms do not get better within 1 week or if they get worse,   check with your doctor.  If you experience "the worst abdominal pain ever" or develop nausea or vomiting, please contact the office immediately for further recommendations for treatment.   ITCHING:  If you experience itching with your medications, try taking only a single pain pill, or even half a pain pill at a time.  You can also use Benadryl over the counter for itching or also to help with sleep.   TED HOSE STOCKINGS:  Use stockings on both legs until for at least 2 weeks or as directed by physician office. They may be removed at night for sleeping.  MEDICATIONS:  See your medication summary on the "After Visit Summary" that nursing will review with you.  You may have some home medications which will be placed on hold until you complete the course of blood thinner medication.  It is important for you to complete the blood thinner medication as prescribed.  PRECAUTIONS:  If you experience chest pain or shortness of breath - call 911 immediately for transfer to the hospital emergency department.   If you develop a fever greater that 101 F, purulent drainage from wound, increased redness or drainage from wound, foul odor from the wound/dressing, or calf pain - CONTACT  YOUR SURGEON.                                                   FOLLOW-UP APPOINTMENTS:  If you do not already have a post-op appointment, please call the office for an appointment to be seen by your surgeon.  Guidelines for how soon to be seen are listed in your "After Visit Summary", but are typically between 1-4 weeks after surgery.  OTHER INSTRUCTIONS:  Dental Antibiotics:  In most cases prophylactic antibiotics for Dental procdeures after total joint surgery are not necessary.  Exceptions are as follows:  1. History of prior total joint infection  2. Severely immunocompromised (Organ Transplant, cancer chemotherapy, Rheumatoid biologic meds such as [place])  3. Poorly controlled diabetes (A1C &gt; 8.0, blood glucose over 200)  If you have one of these conditions, contact your surgeon for an antibiotic prescription, prior to your dental procedure.   MAKE SURE YOU:  . Understand these instructions.  . Get help right away if you are not doing well or get worse.    Thank you for letting us be a part of your medical care team.  It is a privilege we respect greatly.  We hope these instructions will help you stay on track for a fast and full recovery!     Information on my medicine - ELIQUIS (apixaban)  This medication education was reviewed with me or my healthcare representative as part of my discharge preparation.    Why was Eliquis prescribed for you? Eliquis was prescribed for you to reduce the risk of forming blood clots that can cause a stroke if you have a medical condition called atrial fibrillation (a type of irregular heartbeat) OR to reduce the risk of a blood clots forming after orthopedic surgery.  What do You need to know about Eliquis ? Take your Eliquis TWICE DAILY - one tablet in the morning and one tablet in the evening with or without food.  It would be best to take the doses about the same time each day.  If you have difficulty swallowing the tablet  whole please discuss  with your pharmacist how to take the medication safely.  Take Eliquis exactly as prescribed by your doctor and DO NOT stop taking Eliquis without talking to the doctor who prescribed the medication.  Stopping may increase your risk of developing a new clot or stroke.  Refill your prescription before you run out.  After discharge, you should have regular check-up appointments with your healthcare provider that is prescribing your Eliquis.  In the future your dose may need to be changed if your kidney function or weight changes by a significant amount or as you get older.  What do you do if you miss a dose? If you miss a dose, take it as soon as you remember on the same day and resume taking twice daily.  Do not take more than one dose of ELIQUIS at the same time.  Important Safety Information A possible side effect of Eliquis is bleeding. You should call your healthcare provider right away if you experience any of the following: ? Bleeding from an injury or your nose that does not stop. ? Unusual colored urine (red or dark brown) or unusual colored stools (red or black). ? Unusual bruising for unknown reasons. ? A serious fall or if you hit your head (even if there is no bleeding).  Some medicines may interact with Eliquis and might increase your risk of bleeding or clotting while on Eliquis. To help avoid this, consult your healthcare provider or pharmacist prior to using any new prescription or non-prescription medications, including herbals, vitamins, non-steroidal anti-inflammatory drugs (NSAIDs) and supplements.  This website has more information on Eliquis (apixaban): [email or web address].no.    Additional discharge instructions  Please get your medications reviewed and adjusted by your Primary MD.  Please request your Primary MD to go over all Hospital Tests and Procedure/Radiological results at the follow up, please get all Hospital records sent to your Prim MD by  signing hospital release before you go home.  If you had Pneumonia of Lung problems at the Hospital: Please get a 2 view Chest X ray done in 6-8 weeks after hospital discharge or sooner if instructed by your Primary MD.  If you have Congestive Heart Failure: Please call your Cardiologist or Primary MD anytime you have any of the following symptoms:  1) 3 pound weight gain in 24 hours or 5 pounds in 1 week  2) shortness of breath, with or without a dry hacking cough  3) swelling in the hands, feet or stomach  4) if you have to sleep on extra pillows at night in order to breathe  Follow cardiac low salt diet and 1.5 lit/day fluid restriction.  If you have diabetes Accuchecks 4 times/day, Once in AM empty stomach and then before each meal. Log in all results and show them to your primary doctor at your next visit. If any glucose reading is under 80 or above 300 call your primary MD immediately.  If you have Seizure/Convulsions/Epilepsy: Please do not drive, operate heavy machinery, participate in activities at heights or participate in high speed sports until you have seen by Primary MD or a Neurologist and advised to do so again.  If you had Gastrointestinal Bleeding: Please ask your Primary MD to check a complete blood count within one week of discharge or at your next visit. Your endoscopic/colonoscopic biopsies that are pending at the time of discharge, will also need to followed by your Primary MD.  Get Medicines reviewed and adjusted. Please take all your medications with you for  your next visit with your Primary MD  Please request your Primary MD to go over all hospital tests and procedure/radiological results at the follow up, please ask your Primary MD to get all Hospital records sent to his/her office.  If you experience worsening of your admission symptoms, develop shortness of breath, life threatening emergency, suicidal or homicidal thoughts you must seek medical attention  immediately by calling 911 or calling your MD immediately  if symptoms less severe.  You must read complete instructions/literature along with all the possible adverse reactions/side effects for all the Medicines you take and that have been prescribed to you. Take any new Medicines after you have completely understood and accpet all the possible adverse reactions/side effects.   Do not drive or operate heavy machinery when taking Pain medications.   Do not take more than prescribed Pain, Sleep and Anxiety Medications  Special Instructions: If you have smoked or chewed Tobacco  in the last 2 yrs please stop smoking, stop any regular Alcohol  and or any Recreational drug use.  Wear Seat belts while driving.  Please note You were cared for by a hospitalist during your hospital stay. If you have any questions about your discharge medications or the care you received while you were in the hospital after you are discharged, you can call the unit and asked to speak with the hospitalist on call if the hospitalist that took care of you is not available. Once you are discharged, your primary care physician will handle any further medical issues. Please note that NO REFILLS for any discharge medications will be authorized once you are discharged, as it is imperative that you return to your primary care physician (or establish a relationship with a primary care physician if you do not have one) for your aftercare needs so that they can reassess your need for medications and monitor your lab values.  You can reach the hospitalist office at phone [phone] or fax [phone]   If you do not have a primary care physician, you can call [phone] for a physician referral.

## 2020-11-26 NOTE — TOC Progression Note (Signed)
Transition of Care [facility]) - Progression Note    Patient Details  Name: Margaret [name] MRN: [id number] Date of Birth: 06/02/1956  [facility]) CM/SW Contact  Mearl Latin, LCSW Phone Number: 11/26/2020, 5:04 PM  Clinical Narrative:    [facility] now has power and is reviewing referral.   Expected Discharge Plan: Skilled Nursing Facility Barriers to Discharge: SNF Pending bed offer,Continued Medical Work up  Expected Discharge Plan and Services Expected Discharge Plan: Skilled Nursing Facility       Living arrangements for the past 2 months: Single Family Home Expected Discharge Date: 11/23/20                                     Social Determinants of Health (SDOH) Interventions    Readmission Risk Interventions No flowsheet data found.

## 2020-11-26 NOTE — Progress Notes (Signed)
Physical Therapy Treatment Patient Details Name: Margaret [name] MRN: [id number] DOB: 29-Jan-1956 Today's Date: 11/26/2020    History of Present Illness Margaret [name] is a 65 y.o. female with past medical history significant for endocarditis, CHF, COPD, obesity, pulmonary hypertension, valve repair, subarachnoid bleed, CVA, diabetes who presents for evaluation after fall. She had a left sided weakness after prior stroke. She just discontinued using a wheelchair in August of this year. She normally ambulates with a cane. She fell yesterday and had immediate pain in her left hip with inability to bear weight. Orthopedics was consulted for left femur fracture and underwent IM nailing on 12/26    PT Comments    Pt is up to side of bed and nursing arrived during the session.  Pt was afraid to try to take steps but with help could pivot and step to chair, but brought her into rotated posture at the end due to her trying to sit down in a panic.  Follow for acute PT goals and may benefit from [facility] to get more confidence before trying to move on RW alone.  Nurse and PT discussed stedy vs hoyer vs AP transfer to get pt back to bed.   Follow Up Recommendations  SNF     Equipment Recommendations  None recommended by PT    Recommendations for Other Services       Precautions / Restrictions Precautions Precautions: Fall;Other (comment) Precaution Comments: L sided hemiparesis from previous CVA Restrictions Weight Bearing Restrictions: No LLE Weight Bearing: Weight bearing as tolerated    Mobility  Bed Mobility Overal bed mobility: Needs Assistance Bed Mobility: Supine to Sit;Sit to Supine     Supine to sit: Min assist;Mod assist     General bed mobility comments: bed mob with help to support back and get to side of bed with even hip wb  Transfers Overall transfer level: Needs assistance Equipment used: Ambulation equipment used Transfers: Stand Pivot Transfers Sit to Stand:  Mod assist;+2 physical assistance;+2 safety/equipment;From elevated surface        Lateral/Scoot Transfers: Mod assist General transfer comment: Assistance to manage L arm and assist with weight shifting forward into standing.  Mild LOB to the R.  Ambulation/Gait Ambulation/Gait assistance: Mod assist Gait Distance (Feet): 4 Feet Assistive device: Rolling walker (2 wheeled);2 person hand held assist       General Gait Details: pivot steps to chair with pt panicking at the end and trying to sit down mid transfer   Stairs             Wheelchair Mobility    Modified Rankin (Stroke Patients Only)       Balance   Sitting-balance support: Feet supported Sitting balance-Leahy Scale: Good       Standing balance-Leahy Scale: Poor                              Cognition Arousal/Alertness: Awake/alert Behavior During Therapy: WFL for tasks assessed/performed Overall Cognitive Status: Impaired/Different from baseline Area of Impairment: Safety/judgement;Following commands;Awareness;Problem solving                       Following Commands: Follows one step commands with increased time;Follows one step commands inconsistently Safety/Judgement: Decreased awareness of safety;Decreased awareness of deficits Awareness: Intellectual Problem Solving: Slow processing;Requires verbal cues;Requires tactile cues        Exercises      General Comments General comments (skin  integrity, edema, etc.): pt is up to side of bed and standing, up to chair with fearful behavior today      Pertinent Vitals/Pain Pain Assessment: Faces Faces Pain Scale: Hurts little more Pain Location: L hip/LE at end of session Pain Descriptors / Indicators: Guarding;Grimacing;Operative site guarding Pain Intervention(s): Limited activity within patient's tolerance;Monitored during session;Repositioned    Home Living                      Prior Function             PT Goals (current goals can now be found in the care plan section) Acute Rehab PT Goals Patient Stated Goal: pain control, return to PLOF Progress towards PT goals: Progressing toward goals    Frequency    Min 3X/week      PT Plan Current plan remains appropriate    Co-evaluation              AM-PAC PT "6 Clicks" Mobility   Outcome Measure  Help needed turning from your back to your side while in a flat bed without using bedrails?: A Lot Help needed moving from lying on your back to sitting on the side of a flat bed without using bedrails?: A Lot Help needed moving to and from a bed to a chair (including a wheelchair)?: A Lot Help needed standing up from a chair using your arms (e.g., wheelchair or bedside chair)?: A Lot Help needed to walk in hospital room?: Total Help needed climbing 3-5 steps with a railing? : Total 6 Click Score: 10    End of Session Equipment Utilized During Treatment: Gait belt Activity Tolerance: Patient limited by fatigue;Patient limited by pain Patient left: with call bell/phone within reach;in chair;with chair alarm set Nurse Communication: Mobility status PT Visit Diagnosis: Other abnormalities of gait and mobility (R26.89);Muscle weakness (generalized) (M62.81);Difficulty in walking, not elsewhere classified (R26.2);Pain;Hemiplegia and hemiparesis Hemiplegia - Right/Left: Left Hemiplegia - dominant/non-dominant: Non-dominant Hemiplegia - caused by: Cerebral infarction Pain - Right/Left: Left Pain - part of body: Hip;Leg     Time: 1441-1530 PT Time Calculation (min) (ACUTE ONLY): 49 min  Charges:  $Therapeutic Activity: 23-37 mins $Neuromuscular Re-education: 8-22 mins                    Ramond Dial 11/26/2020, 5:57 PM  Mee Hives, PT MS Acute Rehab Dept. Number: [place]

## 2020-11-27 LAB — GLUCOSE, CAPILLARY
Glucose-Capillary: 244 mg/dL — ABNORMAL HIGH (ref 70–99)
Glucose-Capillary: 293 mg/dL — ABNORMAL HIGH (ref 70–99)
Glucose-Capillary: 187 mg/dL — ABNORMAL HIGH (ref 70–99)
Glucose-Capillary: 181 mg/dL — ABNORMAL HIGH (ref 70–99)

## 2020-11-27 LAB — SARS CORONAVIRUS 2 (TAT 6-24 HRS): SARS Coronavirus 2: NEGATIVE

## 2020-11-27 MED ORDER — INSULIN ASPART 100 UNIT/ML ~~LOC~~ SOLN: 0.0000 [IU] | Freq: Every day | SUBCUTANEOUS | 11 refills | Status: DC

## 2020-11-27 MED ORDER — DOCUSATE SODIUM 100 MG PO CAPS: 100.0000 mg | ORAL_CAPSULE | Freq: Two times a day (BID) | ORAL | 0 refills | Status: DC

## 2020-11-27 MED ORDER — INSULIN GLARGINE 100 UNIT/ML ~~LOC~~ SOLN
54.0000 [IU] | Freq: Every day | SUBCUTANEOUS | Status: DC
  Administered 2020-11-28: 54 [IU] via SUBCUTANEOUS
  Filled 2020-11-27: qty 0.54

## 2020-11-27 MED ORDER — ACETAMINOPHEN 325 MG PO TABS: 650.0000 mg | ORAL_TABLET | ORAL | Status: AC | PRN

## 2020-11-27 MED ORDER — GABAPENTIN 300 MG PO CAPS
300.0000 mg | ORAL_CAPSULE | Freq: Every day | ORAL | Status: DC
  Administered 2020-11-27: 300 mg via ORAL
  Filled 2020-11-27: qty 1

## 2020-11-27 MED ORDER — BISACODYL 10 MG RE SUPP: 10.0000 mg | Freq: Every day | RECTAL | 0 refills | Status: DC | PRN

## 2020-11-27 MED ORDER — VITAMIN D 25 MCG (1000 UNIT) PO TABS
1000.0000 [IU] | ORAL_TABLET | Freq: Every day | ORAL | Status: DC
  Administered 2020-11-27 – 2020-11-28 (×2): 1000 [IU] via ORAL
  Filled 2020-11-27 (×2): qty 1

## 2020-11-27 MED ORDER — DULOXETINE HCL 60 MG PO CPEP
60.0000 mg | ORAL_CAPSULE | Freq: Every day | ORAL | Status: DC
  Administered 2020-11-27 – 2020-11-28 (×2): 60 mg via ORAL
  Filled 2020-11-27 (×2): qty 1

## 2020-11-27 MED ORDER — INSULIN ASPART 100 UNIT/ML ~~LOC~~ SOLN: 0.0000 [IU] | Freq: Three times a day (TID) | SUBCUTANEOUS | 11 refills | Status: DC

## 2020-11-27 MED ORDER — BISOPROLOL FUMARATE 10 MG PO TABS: 10.0000 mg | ORAL_TABLET | Freq: Every day | ORAL | Status: AC

## 2020-11-27 MED ORDER — ACETAMINOPHEN 325 MG PO TABS
650.0000 mg | ORAL_TABLET | ORAL | Status: DC | PRN
  Administered 2020-11-27 – 2020-11-28 (×2): 650 mg via ORAL
  Filled 2020-11-27 (×2): qty 2

## 2020-11-27 MED ORDER — INSULIN GLARGINE 100 UNIT/ML ~~LOC~~ SOLN: 54.0000 [IU] | Freq: Every day | SUBCUTANEOUS | 11 refills | Status: AC

## 2020-11-27 MED ORDER — SENNOSIDES-DOCUSATE SODIUM 8.6-50 MG PO TABS: 1.0000 | ORAL_TABLET | Freq: Every evening | ORAL | Status: AC | PRN

## 2020-11-27 NOTE — Progress Notes (Signed)
Physical Therapy Treatment Patient Details Name: Margaret [name] MRN: [id number] DOB: 02/05/1956 Today's Date: 11/27/2020    History of Present Illness Margaret [name] is a 65 y.o. female with past medical history significant for endocarditis, CHF, COPD, obesity, pulmonary hypertension, valve repair, subarachnoid bleed, CVA, diabetes who presents for evaluation after fall. She had a left sided weakness after prior stroke. She just discontinued using a wheelchair in August of this year. She normally ambulates with a cane. She fell yesterday and had immediate pain in her left hip with inability to bear weight. Orthopedics was consulted for left femur fracture and underwent IM nailing on 12/26    PT Comments    Patient received in bed, pleasant and motivated to participate with therapy. She is slightly anxious about attempting to walk/pivot to recliner as she panicked yesterday during mobility. Patient does well with stedy for transfers and was able to practice weight shifting in stedy. Patient will continue to benefit from skilled PT while here to improve functional independence and safety with mobility.       Follow Up Recommendations  SNF;Supervision for mobility/OOB     Equipment Recommendations  None recommended by PT    Recommendations for Other Services       Precautions / Restrictions Precautions Precautions: Fall Precaution Comments: L sided hemiparesis from previous CVA Restrictions Weight Bearing Restrictions: Yes LLE Weight Bearing: Weight bearing as tolerated    Mobility  Bed Mobility Overal bed mobility: Needs Assistance Bed Mobility: Supine to Sit     Supine to sit: Min assist;HOB elevated     General bed mobility comments: Min A to sit EOB with assist to advance trunk  Transfers Overall transfer level: Needs assistance Equipment used: Ambulation equipment used Transfers: Sit to/from Stand Sit to Stand: Mod assist;+2 physical assistance;+2  safety/equipment         General transfer comment: Mod A x 2 for initial sit to stand in [place], able to progress to min guard from [facility]. Michaelyn Barter for safe transfer to chair at end of session due to anxiety from yesterday with pivoting attempts  Ambulation/Gait                 Stairs             Wheelchair Mobility    Modified Rankin (Stroke Patients Only)       Balance Overall balance assessment: Needs assistance Sitting-balance support: Feet supported Sitting balance-Leahy Scale: Good Sitting balance - Comments: supervision for safety.   Standing balance support: Single extremity supported;During functional activity Standing balance-Leahy Scale: Fair Standing balance comment: reliant on UE and external support                            Cognition Arousal/Alertness: Awake/alert Behavior During Therapy: WFL for tasks assessed/performed Overall Cognitive Status: Within Functional Limits for tasks assessed Area of Impairment: Safety/judgement;Awareness;Problem solving                         Safety/Judgement: Decreased awareness of safety;Decreased awareness of deficits Awareness: Emergent Problem Solving: Slow processing;Requires verbal cues;Requires tactile cues General Comments: Pt pleasant and remains anxious for mobility though motivated to attempt tasks. Pt benefits from breakdown of steps during session to improve comfort with OOB tasks.      Exercises Other Exercises Other Exercises: patient with increased pain at end of session with decreased tolerance to attempt LE strengthening. She was  able to do partial LAQ on left with assist.    General Comments        Pertinent Vitals/Pain Pain Assessment: Faces Faces Pain Scale: Hurts even more Pain Location: L hip/LE at end of session, initially reported 1/10 on pain scale Pain Descriptors / Indicators: Grimacing;Guarding;Discomfort Pain Intervention(s): Monitored during  session;Repositioned;Limited activity within patient's tolerance;Relaxation    Home Living                      Prior Function            PT Goals (current goals can now be found in the care plan section) Acute Rehab PT Goals Patient Stated Goal: pain control, return to PLOF PT Goal Formulation: With patient Time For Goal Achievement: 12/02/20 Potential to Achieve Goals: Fair Progress towards PT goals: Progressing toward goals    Frequency    Min 3X/week      PT Plan Current plan remains appropriate    Co-evaluation   Reason for Co-Treatment: For patient/therapist safety;To address functional/ADL transfers   OT goals addressed during session: ADL's and self-care      AM-PAC PT "6 Clicks" Mobility   Outcome Measure  Help needed turning from your back to your side while in a flat bed without using bedrails?: A Lot Help needed moving from lying on your back to sitting on the side of a flat bed without using bedrails?: A Lot Help needed moving to and from a bed to a chair (including a wheelchair)?: A Lot Help needed standing up from a chair using your arms (e.g., wheelchair or bedside chair)?: A Lot Help needed to walk in hospital room?: Total Help needed climbing 3-5 steps with a railing? : Total 6 Click Score: 10    End of Session Equipment Utilized During Treatment: Gait belt Activity Tolerance: Patient limited by pain Patient left: in chair;with call bell/phone within reach;with chair alarm set Nurse Communication: Mobility status PT Visit Diagnosis: Other abnormalities of gait and mobility (R26.89);Muscle weakness (generalized) (M62.81);Difficulty in walking, not elsewhere classified (R26.2);Pain;Hemiplegia and hemiparesis Hemiplegia - Right/Left: Left Hemiplegia - dominant/non-dominant: Non-dominant Hemiplegia - caused by: Cerebral infarction Pain - Right/Left: Left Pain - part of body: Leg;Hip     Time: 1000-1036 PT Time Calculation (min) (ACUTE  ONLY): 36 min  Charges:  $Therapeutic Activity: 8-22 mins                     [employer], PT, [employer] 11/27/20,12:03 PM

## 2020-11-27 NOTE — TOC Progression Note (Signed)
Transition of Care [facility]) - Progression Note    Patient Details  Name: Margaret [name] MRN: [id number] Date of Birth: 16-Jun-1956  Transition of Care [facility]) CM/SW Contact  Mearl Latin, LCSW Phone Number: 11/27/2020, 2:04 PM  Clinical Narrative:    [facility] able to accept patient tomorrow and will discuss contract with [facility] CM Victorino Dike). CSW updated patient and she is pleasant and in agreement with the plan. COVID test requested from MD.    Expected Discharge Plan: Skilled Nursing Facility Barriers to Discharge: SNF Pending bed offer,Continued Medical Work up  Expected Discharge Plan and Services Expected Discharge Plan: Skilled Nursing Facility       Living arrangements for the past 2 months: Single Family Home Expected Discharge Date: 11/23/20                                     Social Determinants of Health (SDOH) Interventions    Readmission Risk Interventions No flowsheet data found.

## 2020-11-27 NOTE — Progress Notes (Signed)
Margaret [name]  [id number] DOB: Nov 25, 1955 DOA: 11/14/2020 PCP: Patient, No Pcp Per    Brief Narrative:  65 year old with a history of panic attacks, chronic diastolic CHF, mitral valve repair with annuloplasty on chronic Eliquis, endocarditis, COPD, pulmonary hypertension, subarachnoid hemorrhage, CVA, DM2 with hyperglycemia, splenic infarction, CKD stage IIIa, and obesity who presented to the ED after a mechanical fall at home.  After her fall she began to experience significant left hip pain and presented to the ED for evaluation.  X-rays noted an intertrochanteric femur fracture on the left in the ED but fortunately no other acute findings were appreciated.  Significant Events:  12/23 admit via [facility] with left hip fracture 12/26 operative repair with left hip nail  Antimicrobials:  None  DVT prophylaxis: Eliquis  Subjective: In good spirits today.  Working with therapy as I enter the room.  Denies any new complaints.  Tells me she is anxious to move on to rehab as soon as possible.  Assessment & Plan:  Left intertrochanteric femur fracture after mechanical fall Care per Orthopedics - status post left hip IM nail - working w/ PT/OT - cleared for discharge from orthopedic standpoint -requires SNF stay as attested to by PT on repeat evaluation 12/30 -awaiting SNF bed -medically clear for discharge  Chronic diastolic CHF Continue beta-blocker and diuretic -no clinical overload  HTN Blood pressure presently at goal  Prior mitral valve repair Eliquis continues  COPD Does not require home O2 -well compensated  Prior CVA with left-sided residual weakness For SNF   Uncontrolled DM2 with hyperglycemia A1c 8.5 -CBG remains elevated above goal - treatment adjusted again today  Acute kidney injury on CKD stage IIIa Baseline creatinine approximately 1.2 -creatinine presently at baseline  HLD Continue usual Crestor  Morbid obesity - Body mass index is 47.73  kg/m.  Intermittent situational anxiety Resumed usual home medical therapy and augmenting with very low-dose Xanax as needed for short course only  Code Status: FULL CODE Family Communication: No family present at time of exam today Status is: Inpatient  Remains inpatient appropriate because:Unsafe d/c plan   Dispo: The patient is from: Home              Anticipated d/c is to: SNF              Anticipated d/c date is: 1 day              Patient currently is medically stable to d/c.   Consultants:  Ortho  Objective: Blood pressure (!) 103/36, pulse 72, temperature 98.2 F (36.8 C), temperature source Oral, resp. rate 15, height 5' 4.02" (1.626 m), weight 126.2 kg, SpO2 92 %.  Intake/Output Summary (Last 24 hours) at 11/27/2020 0920 Last data filed at 11/27/2020 0900 Gross per 24 hour  Intake 600 ml  Output 500 ml  Net 100 ml   Filed Weights   11/18/20 0500 11/19/20 0500 11/20/20 0500  Weight: 128.4 kg 126.8 kg 126.2 kg    Examination: General: No acute respiratory distress Cardiovascular: RRR   CBC: Recent Labs  Lab 11/21/20 0231 11/25/20 0353  WBC 11.0* 12.6*  NEUTROABS 6.6  --   HGB 12.2 12.9  HCT 38.9 41.8  MCV 87.8 87.4  PLT 367 [id number]*   Basic Metabolic Panel: Recent Labs  Lab 11/21/20 0231 11/25/20 0353  NA 134* 133*  K 4.3 4.4  CL 98 96*  CO2 29 24  GLUCOSE 234* 229*  BUN 24* 25*  CREATININE 1.09*  1.25*  CALCIUM 10.0 10.3   GFR: Estimated Creatinine Clearance: 59.8 mL/min (A) (by C-G formula based on SCr of 1.25 mg/dL (H)).  Liver Function Tests: Recent Labs  Lab 11/21/20 0231  AST 13*  ALT 13  ALKPHOS 72  BILITOT 0.4  PROT 6.1*  ALBUMIN 2.2*    HbA1C: Hgb A1c MFr Bld  Date/Time Value Ref Range Status  11/14/2020 09:39 PM 8.5 (H) 4.8 - 5.6 % Final    Comment:    (NOTE) Pre diabetes:          5.7%-6.4%  Diabetes:              >6.4%  Glycemic control for   <7.0% adults with diabetes   04/29/2016 10:40 AM 5.6 4.8 - 5.6 %  Final    Comment:    (NOTE)         Pre-diabetes: 5.7 - 6.4         Diabetes: >6.4         Glycemic control for adults with diabetes: <7.0     CBG: Recent Labs  Lab 11/26/20 0704 11/26/20 1140 11/26/20 1556 11/26/20 2121 11/27/20 0905  GLUCAP 170* 274* 158* 216* 244*     Scheduled Meds: . acetaminophen  1,000 mg Oral Q6H  . apixaban  2.5 mg Oral BID  . bisoprolol  10 mg Oral Daily  . bumetanide  1 mg Oral Daily  . docusate sodium  100 mg Oral BID  . insulin aspart  0-20 Units Subcutaneous TID WC  . insulin aspart  0-5 Units Subcutaneous QHS  . insulin glargine  48 Units Subcutaneous Daily  . lisdexamfetamine  30 mg Oral Daily  . pantoprazole  20 mg Oral Daily  . rosuvastatin  10 mg Oral Daily      LOS: 13 days   Lonia Blood, MD [facility] Office  [phone] Pager - Text Page per Amion  If 7PM-7AM, please contact night-coverage per Amion 11/27/2020, 9:20 AM

## 2020-11-27 NOTE — Discharge Summary (Signed)
DISCHARGE SUMMARY  Margaret [name]  MR#: [id number]  DOB:1955-12-22  Date of Admission: 11/14/2020 Date of Discharge: 11/28/2020  Attending Physician:Malichi Palardy Silvestre Gunner, MD  Patient's WCB:[id number], No Pcp Per  Consults: Orthopedic Surgery   Disposition: D/C to SNF for rehabilitation    Follow-up Appts:  Contact information for follow-up providers    Sheral Apley, MD In 2 weeks.   Specialty: Orthopedic Surgery Contact information: [address] [place] [address] [phone]            Contact information for after-discharge care    Destination    [facility] Preferred SNF .   Service: Skilled Nursing Contact information: [address] [place] [address] [phone]                  Tests Needing Follow-up: -CBG should be followed in the patient's rehab facility as it is likely her treatment regimen will need to be adjusted further -ongoing PT/OT w/ ultimate disposition plan being return to home   Discharge Diagnoses: Left intertrochanteric femur fracture after mechanical fall Chronic diastolic CHF HTN Prior mitral valve repair COPD Prior CVA with left-sided residual weakness Uncontrolled DM2 with hyperglycemia Acute kidney injury on CKD stage IIIa HLD Morbid obesity - Body mass index is 47.73 kg/m. Intermittent situational anxiety  Initial presentation: 65 year old with a history of panic attacks, chronic diastolic CHF, mitral valve repair with annuloplasty on chronic Eliquis, endocarditis, COPD, pulmonary hypertension, subarachnoid hemorrhage, CVA, DM2 with hyperglycemia, splenic infarction, CKD stage IIIa, and obesity who presented to the ED after a mechanical fall at home.  After her fall she began to experience significant left hip pain and presented to the ED for evaluation.  X-rays noted an intertrochanteric femur fracture on the left in the ED but fortunately no other acute  findings were appreciated.  Hospital Course: 12/23 admit via [facility] ED with left hip fracture 12/26 operative repair with left hip nail  Left intertrochanteric femur fracture after mechanical fall Care per Orthopedics - status post left hip IM nail - worked w/ PT/OT - cleared for discharge from orthopedic standpoint -requires SNF stay as attested to by PT on repeat evaluation 12/30 -awaiting SNF bed -medically clear for discharge  Chronic diastolic CHF Continue beta-blocker and diuretic -no clinical overload  HTN Blood pressure controlled at time of d/c   Prior mitral valve repair Eliquis continues  COPD Does not require home O2 -well compensated  Prior CVA with left-sided residual weakness For SNF   Uncontrolled DM2 with hyperglycemia A1c 8.5 -CBG remains elevated above goal - treatment adjusted during hospital stay - ongoing outpatient adjustments likely to be required   Acute kidney injury on CKD stage IIIa Baseline creatinine approximately 1.2 -creatinine presently at baseline  HLD Continue usual Crestor  Morbid obesity - Body mass index is 47.73 kg/m.  Intermittent situational anxiety Resumed usual home medical therapy - augmented with very low-dose Xanax as needed only during hospital stay    Allergies as of 11/27/2020      Reactions   Codeine Shortness Of Breath, Itching, Other (See Comments)   No rash   Lisinopril Swelling   Lips turn blue, face swelling   Latex Itching      Medication List    STOP taking these medications   albuterol 108 (90 Base) MCG/ACT inhaler Commonly known as: VENTOLIN HFA   nitroGLYCERIN 0.4 MG SL tablet Commonly known as: NITROSTAT   Xultophy 100-3.6 UNIT-MG/ML Sopn Generic drug:  Insulin Degludec-Liraglutide     TAKE these medications   acetaminophen 325 MG tablet Commonly known as: TYLENOL Take 2 tablets (650 mg total) by mouth every 4 (four) hours as needed for mild pain, headache or fever.   apixaban 2.5 MG  Tabs tablet Commonly known as: ELIQUIS Take 2.5 mg by mouth 2 (two) times daily.   bisacodyl 10 MG suppository Commonly known as: DULCOLAX Place 1 suppository (10 mg total) rectally daily as needed for moderate constipation.   bisoprolol 10 MG tablet Commonly known as: ZEBETA Take 1 tablet (10 mg total) by mouth daily. Start taking on: November 28, 2020 What changed:   medication strength  how much to take   bumetanide 1 MG tablet Commonly known as: BUMEX Take 1 mg by mouth daily.   cholecalciferol 1000 units tablet Commonly known as: VITAMIN D Take 1,000 Units by mouth daily.   diphenhydrAMINE 50 MG tablet Commonly known as: BENADRYL Take 0.5 tablets (25 mg total) by mouth at bedtime as needed for itching (take with oxycodone).   docusate sodium 100 MG capsule Commonly known as: COLACE Take 1 capsule (100 mg total) by mouth 2 (two) times daily.   DULoxetine 60 MG capsule Commonly known as: CYMBALTA Take 60 mg by mouth daily.   fexofenadine 180 MG tablet Commonly known as: ALLEGRA Take 180 mg by mouth daily.   gabapentin 300 MG capsule Commonly known as: NEURONTIN Take 300 mg by mouth at bedtime.   insulin aspart 100 UNIT/ML injection Commonly known as: novoLOG Inject 0-20 Units into the skin 3 (three) times daily with meals.   insulin aspart 100 UNIT/ML injection Commonly known as: novoLOG Inject 0-5 Units into the skin at bedtime.   insulin glargine 100 UNIT/ML injection Commonly known as: LANTUS Inject 0.54 mLs (54 Units total) into the skin daily. Start taking on: November 28, 2020   lisdexamfetamine 30 MG capsule Commonly known as: VYVANSE Take 30 mg by mouth daily.   methocarbamol 500 MG tablet Commonly known as: Robaxin Take 1 tablet (500 mg total) by mouth every 8 (eight) hours as needed for muscle spasms.   ondansetron 4 MG/2ML Soln injection Commonly known as: ZOFRAN Inject 2 mLs (4 mg total) into the vein every 6 (six) hours as needed for  nausea or vomiting.   oxyCODONE 5 MG immediate release tablet Commonly known as: Oxy IR/ROXICODONE Take 1 tablet (5 mg total) by mouth every 6 (six) hours as needed for severe pain.   pantoprazole 20 MG tablet Commonly known as: PROTONIX Take 20 mg by mouth daily.   rosuvastatin 10 MG tablet Commonly known as: CRESTOR Take 10 mg by mouth daily.   senna-docusate 8.6-50 MG tablet Commonly known as: Senokot-S Take 1 tablet by mouth at bedtime as needed for mild constipation.   spironolactone 25 MG tablet Commonly known as: ALDACTONE Take 2 tablets (50 mg total) by mouth daily. Reported on 04/09/2016 What changed:   how much to take  when to take this  additional instructions       Day of Discharge BP (!) 103/36 (BP Location: Left Arm) Comment: RN notified  Pulse 72   Temp 98.2 F (36.8 C) (Oral)   Resp 15   Ht 5' 4.02" (1.626 m)   Wt 126.2 kg   SpO2 92%   BMI 47.73 kg/m   Physical Exam: General: No acute respiratory distress Lungs: Clear to auscultation bilaterally without wheezes or crackles Cardiovascular: Regular rate and rhythm without murmur gallop or rub normal  S1 and S2 Abdomen: Nontender, nondistended, soft, bowel sounds positive, no rebound, no ascites, no appreciable mass Extremities: No significant cyanosis, clubbing, or edema bilateral lower extremities  Basic Metabolic Panel: Recent Labs  Lab 11/21/20 0231 11/25/20 0353  NA 134* 133*  K 4.3 4.4  CL 98 96*  CO2 29 24  GLUCOSE 234* 229*  BUN 24* 25*  CREATININE 1.09* 1.25*  CALCIUM 10.0 10.3    Liver Function Tests: Recent Labs  Lab 11/21/20 0231  AST 13*  ALT 13  ALKPHOS 72  BILITOT 0.4  PROT 6.1*  ALBUMIN 2.2*    CBC: Recent Labs  Lab 11/21/20 0231 11/25/20 0353  WBC 11.0* 12.6*  NEUTROABS 6.6  --   HGB 12.2 12.9  HCT 38.9 41.8  MCV 87.8 87.4  PLT 367 461*    CBG: Recent Labs  Lab 11/26/20 1140 11/26/20 1556 11/26/20 2121 11/27/20 0905 11/27/20 1127  GLUCAP  274* 158* 216* 244* 293*     Time spent in discharge (includes decision making & examination of pt): 35 minutes  11/27/2020, 12:15 PM   Lonia Blood, MD [facility] Office  [phone]

## 2020-11-27 NOTE — Progress Notes (Signed)
Occupational Therapy Treatment Patient Details Name: Margaret [name] MRN: [id number] DOB: 02-11-1956 Today's Date: 11/27/2020    History of present illness Margaret [name] is a 65 y.o. female with past medical history significant for endocarditis, CHF, COPD, obesity, pulmonary hypertension, valve repair, subarachnoid bleed, CVA, diabetes who presents for evaluation after fall. She had a left sided weakness after prior stroke. She just discontinued using a wheelchair in August of this year. She normally ambulates with a cane. She fell yesterday and had immediate pain in her left hip with inability to bear weight. Orthopedics was consulted for left femur fracture and underwent IM nailing on 12/26   OT comments  Pt progressing towards OT goals and remains motivated to work with therapies. On entry, pt reporting need to use BSC. Located bariatric BSC and assisted pt to transfer via [place]. Pt initially Mod A x 2 for sit to stand in Home, but then able to demo ability to pull self up in [place] without assistance. Pt continues to require extensive assist for LB ADLs, including toileting hygiene, due to deficits in strength, balance, and pain. Continue to recommend SNF for short term rehab. Plan to progress pivots during ADLs as appropriate.    Follow Up Recommendations  SNF;Supervision/Assistance - 24 hour    Equipment Recommendations  Tub/shower bench    Recommendations for Other Services      Precautions / Restrictions Precautions Precautions: Fall;Other (comment) Precaution Comments: L sided hemiparesis from previous CVA Restrictions Weight Bearing Restrictions: Yes LLE Weight Bearing: Weight bearing as tolerated       Mobility Bed Mobility Overal bed mobility: Needs Assistance Bed Mobility: Supine to Sit     Supine to sit: Min assist;HOB elevated     General bed mobility comments: Min A to sit EOB with assist to advance trunk  Transfers Overall transfer level: Needs  assistance Equipment used: Ambulation equipment used Transfers: Sit to/from Stand Sit to Stand: Mod assist;+2 physical assistance;+2 safety/equipment         General transfer comment: Mod A x 2 for initial sit to stand in [place], able to progress to min guard from [facility]. Michaelyn Barter for safe transfer to chair at end of session due to anxiety from yesterday with pivoting attempts    Balance Overall balance assessment: Needs assistance Sitting-balance support: Feet supported Sitting balance-Leahy Scale: Good     Standing balance support: Bilateral upper extremity supported;Single extremity supported;During functional activity Standing balance-Leahy Scale: Poor Standing balance comment: reliant on UE and external support                           ADL either performed or assessed with clinical judgement   ADL Overall ADL's : Needs assistance/impaired                     Lower Body Dressing: Total assistance;Bed level Lower Body Dressing Details (indicate cue type and reason): Total A to don socks in bed. Continued problem solving needed for compensatory strategies Toilet Transfer: Maximal assistance;+2 for physical assistance;+2 for safety/equipment;BSC Charlaine Dalton) Toilet Transfer Details (indicate cue type and reason): Max A x 2 for transfer bed > BSC with Stedy Toileting- Clothing Manipulation and Hygiene: Maximal assistance;Sit to/from stand Toileting - Clothing Manipulation Details (indicate cue type and reason): Max A for peri care in standing with Stedy after urination       General ADL Comments: Improving pain tolerance and motivated to attempt all tasks. Continues  to be limited  by decreased LE strength, standing balance and anxiety in standing     Vision   Vision Assessment?: No apparent visual deficits   Perception     Praxis      Cognition Arousal/Alertness: Awake/alert Behavior During Therapy: WFL for tasks assessed/performed Overall Cognitive  Status: Impaired/Different from baseline Area of Impairment: Safety/judgement;Awareness;Problem solving                         Safety/Judgement: Decreased awareness of safety;Decreased awareness of deficits Awareness: Emergent Problem Solving: Slow processing;Requires verbal cues;Requires tactile cues General Comments: Pt pleasant and remains anxious for mobility though motivated to attempt tasks. Pt benefits from breakdown of steps during session to improve comfort with OOB tasks.        Exercises     Shoulder Instructions       General Comments      Pertinent Vitals/ Pain       Pain Assessment: Faces Faces Pain Scale: Hurts even more Pain Location: L hip/LE at end of session Pain Descriptors / Indicators: Guarding;Grimacing;Operative site guarding Pain Intervention(s): Monitored during session;Premedicated before session;Patient requesting pain meds-RN notified  Home Living                                          Prior Functioning/Environment              Frequency  Min 2X/week        Progress Toward Goals  OT Goals(current goals can now be found in the care plan section)  Progress towards OT goals: Progressing toward goals  Acute Rehab OT Goals Patient Stated Goal: pain control, return to PLOF OT Goal Formulation: With patient Time For Goal Achievement: 12/02/20 Potential to Achieve Goals: Good ADL Goals Pt Will Perform Lower Body Bathing: with min assist;sitting/lateral leans;sit to/from stand Pt Will Perform Upper Body Dressing: with set-up;sitting Pt Will Perform Lower Body Dressing: with min assist;sitting/lateral leans;sit to/from stand Pt Will Transfer to Toilet: with mod assist;stand pivot transfer;bedside commode Additional ADL Goal #1: Pt to demonstrate ability to tolerate sitting EOB > 5 min during ADL tasks with no more than min guard for balance  Plan Discharge plan remains appropriate    Co-evaluation     PT/OT/SLP Co-Evaluation/Treatment: Yes Reason for Co-Treatment: For patient/therapist safety;To address functional/ADL transfers   OT goals addressed during session: ADL's and self-care      AM-PAC OT "6 Clicks" Daily Activity     Outcome Measure   Help from another person eating meals?: A Little Help from another person taking care of personal grooming?: A Little Help from another person toileting, which includes using toliet, bedpan, or urinal?: A Lot Help from another person bathing (including washing, rinsing, drying)?: Total Help from another person to put on and taking off regular upper body clothing?: A Lot Help from another person to put on and taking off regular lower body clothing?: Total 6 Click Score: 12    End of Session Equipment Utilized During Treatment: Gait belt;Other (comment) Charlaine Dalton)  OT Visit Diagnosis: Other abnormalities of gait and mobility (R26.89);Muscle weakness (generalized) (M62.81);History of falling (Z91.81);Pain Pain - Right/Left: Left Pain - part of body: Hip   Activity Tolerance Patient tolerated treatment well   Patient Left in chair;Other (comment) (with PT)   Nurse Communication Mobility status  Time: 6333-5456 OT Time Calculation (min): 34 min  Charges: OT General Charges $OT Visit: 1 Visit OT Treatments $Self Care/Home Management : 8-22 mins  Lorre Munroe, OTR/L   Lorre Munroe 11/27/2020, 10:46 AM

## 2020-11-28 LAB — BASIC METABOLIC PANEL
Anion gap: 9 (ref 5–15)
BUN: 27 mg/dL — ABNORMAL HIGH (ref 8–23)
CO2: 28 mmol/L (ref 22–32)
Calcium: 10.2 mg/dL (ref 8.9–10.3)
Chloride: 96 mmol/L — ABNORMAL LOW (ref 98–111)
Creatinine, Ser: 1.18 mg/dL — ABNORMAL HIGH (ref 0.44–1.00)
GFR, Estimated: 52 mL/min — ABNORMAL LOW (ref >60)
Glucose, Bld: 207 mg/dL — ABNORMAL HIGH (ref 70–99)
Potassium: 4.4 mmol/L (ref 3.5–5.1)
Sodium: 133 mmol/L — ABNORMAL LOW (ref 135–145)

## 2020-11-28 LAB — GLUCOSE, CAPILLARY
Glucose-Capillary: 170 mg/dL — ABNORMAL HIGH (ref 70–99)
Glucose-Capillary: 216 mg/dL — ABNORMAL HIGH (ref 70–99)

## 2020-11-28 MED ORDER — OXYCODONE HCL 5 MG PO TABS: 5.0000 mg | ORAL_TABLET | Freq: Four times a day (QID) | ORAL | 0 refills | Status: AC | PRN

## 2020-11-28 MED ORDER — INSULIN ASPART 100 UNIT/ML ~~LOC~~ SOLN: 0.0000 [IU] | Freq: Three times a day (TID) | SUBCUTANEOUS | Status: AC

## 2020-11-28 MED ORDER — INSULIN ASPART 100 UNIT/ML ~~LOC~~ SOLN: 0.0000 [IU] | Freq: Every day | SUBCUTANEOUS | Status: AC

## 2020-11-28 MED ORDER — OXYCODONE HCL 5 MG PO TABS: 5.0000 mg | ORAL_TABLET | Freq: Four times a day (QID) | ORAL | 0 refills | Status: DC | PRN

## 2020-11-28 NOTE — TOC Progression Note (Addendum)
Transition of Care [facility]) - Progression Note    Patient Details  Name: Margaret [name] MRN: [id number] Date of Birth: Jan 05, 1956  [facility]) CM/SW Contact  Mearl Latin, LCSW Phone Number: 11/28/2020, 9:12 AM  Clinical Narrative:    9:12am-[facility] awaiting final communication from [facility] for patient to discharge there today. [facility] would like to pick the patient up at 1pm.  9:30am-[facility] called CSW back and stated transport would arrive at [facility] tower at 2pm. CSW provided nursing station contact number upon arrival.    Expected Discharge Plan: Skilled Nursing Facility Barriers to Discharge: SNF Pending bed offer,Continued Medical Work up  Expected Discharge Plan and Services Expected Discharge Plan: Skilled Nursing Facility       Living arrangements for the past 2 months: Single Family Home Expected Discharge Date: 11/23/20                                     Social Determinants of Health (SDOH) Interventions    Readmission Risk Interventions No flowsheet data found.

## 2020-11-28 NOTE — Discharge Summary (Addendum)
DISCHARGE SUMMARY  Margaret [name]  MR#: [id number]  DOB:May 03, 1956  Date of Admission: 11/14/2020 Date of Discharge: 11/28/2020  Attending Physician:Doreena Maulden Waymon Amato, MD  Patient's XBM:[id number], No Pcp Per.  Patient unsure as to her PCP and indicates Dr. Maisie Fus (does not know first name) or Meriam Sprague, PA (on review indicates psychiatry in [facility]).  She may follow-up with her prior PCP upon discharge from SNF.  If not, SNF to assist her with finding a new PCP upon discharge  Consults: Orthopedic Surgery   Disposition: D/C to SNF for rehabilitation    Follow-up Appts:  Contact information for follow-up providers    Sheral Apley, MD In 2 weeks.   Specialty: Orthopedic Surgery Contact information: [address] [place] [address] [phone]        MD at SNF. Schedule an appointment as soon as possible for a visit.   Why: To be seen in 2 to 3 days with repeat labs (CBC & BMP)           Contact information for after-discharge care    Destination    [facility]erred SN[facility] .   Service: Skilled Nursing Contact information: [address] [place] [address] [phone]                  Tests Needing Follow-up: -CBG should be followed in the patient's rehab facility as it is likely her treatment regimen will need to be adjusted further -ongoing PT/OT w/ ultimate disposition plan being return to home   Discharge Diagnoses: Left intertrochanteric femur fracture after mechanical fall Chronic diastolic CHF HTN Prior mitral valve repair COPD Prior CVA with left-sided residual weakness Uncontrolled DM2 with hyperglycemia Acute kidney injury on CKD stage IIIa HLD Morbid obesity - Body mass index is 47.73 kg/m. Intermittent situational anxiety  Initial presentation: 65 year old with a history of panic attacks, chronic diastolic CHF, mitral valve repair with annuloplasty on chronic Eliquis,  endocarditis, COPD, pulmonary hypertension, subarachnoid hemorrhage, CVA, DM2 with hyperglycemia, splenic infarction, CKD stage IIIa, and obesity who presented to the ED after a mechanical fall at home.  After her fall she began to experience significant left hip pain and presented to the ED for evaluation.  X-rays noted an intertrochanteric femur fracture on the left in the ED but fortunately no other acute findings were appreciated.  Hospital Course: 12/23 admit via [facility] ED with left hip fracture 12/26 operative repair with left hip nail  Left intertrochanteric femur fracture after mechanical fall Care per Orthopedics - status post left hip IM nail - worked w/ PT/OT - cleared for discharge from orthopedic standpoint -requires SNF stay as attested to by PT on repeat evaluation 12/30 -awaiting SNF bed -medically clear for discharge.  Recommendations as below as per orthopedics signoff note on 11/21/2020:  Weightbearing: WBAT LLE Insicional and dressing care: Dressings left intact until follow-up, can change if bloody or falling off Showering: Keep dressing dry VTE prophylaxis: Eliquis, SCDs, ambulation Pain control: Continue current regimen Follow up: with Dr. Eulah Pont 7-10 days after discharge Contact information:  Margarita Rana MD, Levester Fresh PA-C   Chronic diastolic CHF Continue beta-blocker and diuretic -no clinical overload  HTN Blood pressure controlled at time of d/c.  Bisoprolol dose appears to have been increased this admission.  Prior mitral valve repair Eliquis continues.  Wonder if her Eliquis dose can be increased to 5 mg twice daily.  Defer to her outpatient physicians for managing this.  COPD Does  not require home O2 -well compensated.  Continue as needed albuterol inhaler.  Recheck oxygen saturations on room air and patient is not hypoxic.  Prior CVA with left-sided residual weakness For SNF   Uncontrolled DM2/IDDM with hyperglycemia A1c 8.5 -CBG remains  elevated above goal - treatment adjusted during hospital stay - ongoing outpatient adjustments likely to be required.  Patient's Xultophy that she was on PTA was discontinued and she was switched to Lantus and NovoLog SSI at discharge.  Reassess at SNF regarding changing back to her prior medications at time of discharge.  CBGs reasonably controlled since last evening in the 170-187 range.  Acute kidney injury on CKD stage IIIa Baseline creatinine approximately 1.2 -creatinine down to 1.18.  HLD Continue usual Crestor  Morbid obesity - Body mass index is 47.73 kg/m.  Intermittent situational anxiety Resumed usual home medical therapy - augmented with very low-dose Xanax as needed only during hospital stay.  No Xanax at discharge.   Allergies as of 11/28/2020      Reactions   Codeine Shortness Of Breath, Itching, Other (See Comments)   No rash   Lisinopril Swelling   Lips turn blue, face swelling   Latex Itching      Medication List    STOP taking these medications   nitroGLYCERIN 0.4 MG SL tablet Commonly known as: NITROSTAT   Xultophy 100-3.6 UNIT-MG/ML Sopn Generic drug: Insulin Degludec-Liraglutide     TAKE these medications   acetaminophen 325 MG tablet Commonly known as: TYLENOL Take 2 tablets (650 mg total) by mouth every 4 (four) hours as needed for mild pain, headache or fever.   albuterol 108 (90 Base) MCG/ACT inhaler Commonly known as: VENTOLIN HFA Inhale 2 puffs into the lungs every 4 (four) hours as needed for wheezing or shortness of breath. Reported on 06/12/2016   apixaban 2.5 MG Tabs tablet Commonly known as: ELIQUIS Take 2.5 mg by mouth 2 (two) times daily.   bisacodyl 10 MG suppository Commonly known as: DULCOLAX Place 1 suppository (10 mg total) rectally daily as needed for moderate constipation.   bisoprolol 10 MG tablet Commonly known as: ZEBETA Take 1 tablet (10 mg total) by mouth daily. What changed:   medication strength  how much to  take   bumetanide 1 MG tablet Commonly known as: BUMEX Take 1 mg by mouth daily.   cholecalciferol 1000 units tablet Commonly known as: VITAMIN D Take 1,000 Units by mouth daily.   docusate sodium 100 MG capsule Commonly known as: COLACE Take 1 capsule (100 mg total) by mouth 2 (two) times daily.   DULoxetine 60 MG capsule Commonly known as: CYMBALTA Take 60 mg by mouth daily.   fexofenadine 180 MG tablet Commonly known as: ALLEGRA Take 180 mg by mouth daily.   gabapentin 300 MG capsule Commonly known as: NEURONTIN Take 300 mg by mouth at bedtime.   insulin aspart 100 UNIT/ML injection Commonly known as: novoLOG Inject 0-5 Units into the skin at bedtime. Correction coverage: HS scale CBG < 70: implement hypoglycemia protocol CBG 70 - 120: 0 units CBG 121 - 150: 0 units CBG 151 - 200: 0 units CBG 201 - 250: 2 units CBG 251 - 300: 3 units CBG 301 - 350: 4 units CBG 351 - 400: 5 units CBG > 400 call MD.   insulin aspart 100 UNIT/ML injection Commonly known as: novoLOG Inject 0-20 Units into the skin 3 (three) times daily with meals. Correction coverage: Resistant (obese, steroids) CBG < 70: Implement Hypoglycemia  protocol. CBG 70 - 120: 0 units CBG 121 - 150: 3 units CBG 151 - 200: 4 units CBG 201 - 250: 7 units CBG 251 - 300: 11 units CBG 301 - 350: 15 units CBG 351 - 400: 20 units CBG > 400 call MD.   insulin glargine 100 UNIT/ML injection Commonly known as: LANTUS Inject 0.54 mLs (54 Units total) into the skin daily.   lisdexamfetamine 30 MG capsule Commonly known as: VYVANSE Take 30 mg by mouth daily.   methocarbamol 500 MG tablet Commonly known as: Robaxin Take 1 tablet (500 mg total) by mouth every 8 (eight) hours as needed for muscle spasms.   oxyCODONE 5 MG immediate release tablet Commonly known as: Oxy IR/ROXICODONE Take 1 tablet (5 mg total) by mouth every 6 (six) hours as needed for up to 5 days for severe pain.   pantoprazole 20 MG  tablet Commonly known as: PROTONIX Take 20 mg by mouth daily.   rosuvastatin 10 MG tablet Commonly known as: CRESTOR Take 10 mg by mouth daily.   senna-docusate 8.6-50 MG tablet Commonly known as: Senokot-S Take 1 tablet by mouth at bedtime as needed for mild constipation.   spironolactone 25 MG tablet Commonly known as: ALDACTONE Take 2 tablets (50 mg total) by mouth daily. Reported on 04/09/2016 What changed:   how much to take  when to take this  additional instructions       Day of Discharge  Subjective: "I am good".  Appropriate postop left hip soreness without significant pain.  Has had multiple BMs.  Tolerating diet.  No other complaints reported.  As per RN, no acute issues noted.  Objective: BP 116/78 (BP Location: Right Arm)   Pulse 65   Temp (!) 97.4 F (36.3 C) (Oral)   Resp 17   Ht 5' 4.02" (1.626 m)   Wt 126.2 kg   SpO2 93%   BMI 47.73 kg/m   Physical Exam: General: Middle-aged female, moderately built and obese lying comfortably propped up in bed without discharge. Lungs: Clear to auscultation bilaterally without wheezes or crackles.  No increased work of breathing. Cardiovascular: Regular rate and rhythm without murmur gallop or rub normal S1 and S2.  No pedal edema Abdomen: Nontender, nondistended, soft, bowel sounds positive, no rebound, no ascites, no appreciable mass Extremities: No significant cyanosis, clubbing, or edema bilateral lower extremities.  Chronic left hemiparesis from prior stroke.  Seems to have some left finger/hand contractures.  Left hip surgical dressing clean and dry and intact. CNS: Alert and oriented.  No cranial nerve deficits.  Basic Metabolic Panel: Recent Labs  Lab 11/25/20 0353 11/28/20 0205  NA 133* 133*  K 4.4 4.4  CL 96* 96*  CO2 24 28  GLUCOSE 229* 207*  BUN 25* 27*  CREATININE 1.25* 1.18*  CALCIUM 10.3 10.2    Liver Function Tests: No results for input(s): AST, ALT, ALKPHOS, BILITOT, PROT, ALBUMIN in  the last 168 hours.  CBC: Recent Labs  Lab 11/25/20 0353  WBC 12.6*  HGB 12.9  HCT 41.8  MCV 87.4  PLT 461*    CBG: Recent Labs  Lab 11/27/20 1127 11/27/20 1612 11/27/20 2227 11/28/20 0748 11/28/20 1212  GLUCAP 293* 187* 181* 170* 216*     Time spent in discharge (includes decision making & examination of pt): 35 minutes  11/28/2020, 4:03 PM   Margaret Scott, MD, [place], [facility]  To contact the attending provider between 7A-7P or the covering provider during after hours 7P-7A,  please log into the web site [email or web address] and access using universal [place] password for that web site. If you do not have the password, please call the hospital operator.

## 2020-11-28 NOTE — Progress Notes (Signed)
At 1419  Pt discharging to SNF, [facility].  Copy of instructions printed and sent with transporter from [place] services for facility.  Pt d/c'd with belongings, transported via wheelchair from [employer]. Pt's husband here at discharge and took some of pt's belonging bags with him, some by transporter from [place].   At 1410   Report was called to RN Erle Crocker at [facility], [place]. [phone].

## 2020-11-28 NOTE — TOC Transition Note (Addendum)
Transition of Care [facility]) - CM/SW Discharge Note   Patient Details  Name: Margaret [name] MRN: [id number] Date of Birth: Dec 08, 1955  Transition of [facility]) CM/SW Contact:  Mearl Latin, LCSW Phone Number: 11/28/2020, 11:46 AM   Clinical Narrative:    Patient will DC to: [facility]  Anticipated DC date: 11/28/20 Family notified: Spouse Transport by: Glennon Hamilton 2pm  Please make sure oxy script goes with patient.   Per MD patient ready for DC to [facility]. RN to call report prior to discharge [phone]). RN, patient, patient's family, and facility notified of DC. Discharge Summary and FL2 sent to facility. DC packet on chart. Ambulance transport requested for patient.   CSW will sign off for now as social work intervention is no longer needed. Please consult [place] again if new needs arise.      Final next level of care: Skilled Nursing Facility Barriers to Discharge: Barriers Resolved   Patient Goals and CMS Choice Patient states their goals for this hospitalization and ongoing recovery are:: to get better and return home CMS Medicare.gov Compare Post Acute Care list provided to:: Patient Choice offered to / list presented to : Patient  Discharge Placement   Existing PASRR number confirmed : 11/28/20          Patient chooses bed at: [facility] Patient to be transferred to facility by: Nancie Neas Transport Name of family member notified: Patient alerted husband Patient and family notified of of transfer: 11/28/20  Discharge Plan and Services                                     Social Determinants of Health (SDOH) Interventions     Readmission Risk Interventions No flowsheet data found.

## 2020-11-28 NOTE — Progress Notes (Signed)
Progress Notes  Patient was ready for discharge pending SNF placement and her discharge summary was completed by Dr. Sharon Seller on 11/28/2019.  I personally interviewed and examined the patient, edited and made appropriate changes to yesterday's discharge summary.  Patient is supposed to discharge to SNF today at around 2 PM as per [facility] update.  Marcellus Scott, MD, [place], [facility]. [facility]  To contact the attending provider between 7A-7P or the covering provider during after hours 7P-7A, please log into the web site [email or web address] and access using universal [place] password for that web site. If you do not have the password, please call the hospital operator.

## 2022-04-17 DIAGNOSIS — I272 Pulmonary hypertension, unspecified: Secondary | ICD-10-CM | POA: Diagnosis not present

## 2022-04-17 DIAGNOSIS — I361 Nonrheumatic tricuspid (valve) insufficiency: Secondary | ICD-10-CM | POA: Diagnosis not present

## 2022-08-11 ENCOUNTER — Emergency Department (HOSPITAL_COMMUNITY)
Admission: EM | Admit: 2022-08-11 | Discharge: 2022-08-11 | Disposition: A | Discharge status: Home / Self Care | Payer: No Typology Code available for payment source | Attending: Emergency Medicine | Admitting: Emergency Medicine

## 2022-08-11 ENCOUNTER — Emergency Department (HOSPITAL_COMMUNITY): Payer: No Typology Code available for payment source

## 2022-08-11 ENCOUNTER — Encounter (HOSPITAL_COMMUNITY): Payer: Self-pay | Admitting: Emergency Medicine

## 2022-08-11 ENCOUNTER — Other Ambulatory Visit: Payer: Self-pay

## 2022-08-11 DIAGNOSIS — R0602 Shortness of breath: Secondary | ICD-10-CM | POA: Insufficient documentation

## 2022-08-11 DIAGNOSIS — R079 Chest pain, unspecified: Secondary | ICD-10-CM

## 2022-08-11 DIAGNOSIS — Z7901 Long term (current) use of anticoagulants: Secondary | ICD-10-CM | POA: Insufficient documentation

## 2022-08-11 DIAGNOSIS — R0789 Other chest pain: Secondary | ICD-10-CM | POA: Insufficient documentation

## 2022-08-11 DIAGNOSIS — Z91040 Latex allergy status: Secondary | ICD-10-CM | POA: Diagnosis not present

## 2022-08-11 LAB — BASIC METABOLIC PANEL
Anion gap: 8 (ref 5–15)
BUN: 15 mg/dL (ref 8–23)
CO2: 30 mmol/L (ref 22–32)
Calcium: 9.9 mg/dL (ref 8.9–10.3)
Chloride: 101 mmol/L (ref 98–111)
Creatinine, Ser: 1.16 mg/dL — ABNORMAL HIGH (ref 0.44–1.00)
GFR, Estimated: 52 mL/min — ABNORMAL LOW (ref >60)
Glucose, Bld: 136 mg/dL — ABNORMAL HIGH (ref 70–99)
Potassium: 4.3 mmol/L (ref 3.5–5.1)
Sodium: 139 mmol/L (ref 135–145)

## 2022-08-11 LAB — CBC WITH DIFFERENTIAL/PLATELET
Abs Immature Granulocytes: 0.03 10*3/uL (ref 0.00–0.07)
Basophils Absolute: 0.1 10*3/uL (ref 0.0–0.1)
Basophils Relative: 1 %
Eosinophils Absolute: 0.2 10*3/uL (ref 0.0–0.5)
Eosinophils Relative: 1 %
HCT: 43 % (ref 36.0–46.0)
Hemoglobin: 12.9 g/dL (ref 12.0–15.0)
Immature Granulocytes: 0 %
Lymphocytes Relative: 9 %
Lymphs Abs: 1 10*3/uL (ref 0.7–4.0)
MCH: 28.7 pg (ref 26.0–34.0)
MCHC: 30 g/dL (ref 30.0–36.0)
MCV: 95.6 fL (ref 80.0–100.0)
Monocytes Absolute: 0.7 10*3/uL (ref 0.1–1.0)
Monocytes Relative: 6 %
Neutro Abs: 9.8 10*3/uL — ABNORMAL HIGH (ref 1.7–7.7)
Neutrophils Relative %: 83 %
Platelets: 376 10*3/uL (ref 150–400)
RBC: 4.5 MIL/uL (ref 3.87–5.11)
RDW: 15.6 % — ABNORMAL HIGH (ref 11.5–15.5)
WBC: 11.8 10*3/uL — ABNORMAL HIGH (ref 4.0–10.5)
nRBC: 0 % (ref 0.0–0.2)

## 2022-08-11 LAB — TROPONIN I (HIGH SENSITIVITY)
Troponin I (High Sensitivity): 11 ng/L (ref <18)
Troponin I (High Sensitivity): 13 ng/L (ref <18)

## 2022-08-11 LAB — BRAIN NATRIURETIC PEPTIDE: B Natriuretic Peptide: 600.1 pg/mL — ABNORMAL HIGH (ref 0.0–100.0)

## 2022-08-11 MED ORDER — DICLOFENAC SODIUM 1 % EX GEL: 4.0000 g | Freq: Four times a day (QID) | CUTANEOUS | 0 refills | Status: AC

## 2022-08-11 MED ORDER — FUROSEMIDE 10 MG/ML IJ SOLN
40.0000 mg | Freq: Once | INTRAMUSCULAR | Status: AC
  Administered 2022-08-11: 40 mg via INTRAVENOUS
  Filled 2022-08-11: qty 4

## 2022-08-11 NOTE — ED Provider Notes (Signed)
[facility] EMERGENCY DEPARTMENT Provider Note   CSN: [id number] Arrival date & time: 08/11/22  0451     History  Chief Complaint  Patient presents with   Chest Pain    Margaret [name] is a 66 y.o. female.  66 yo F with a chief complaint of left-sided chest pain.  This is to the left lateral aspect of the chest and radiates across the center.  Going on for a couple days.  She is not sure what makes this better or worse.  Seems to come and go.  Denies abdominal pain denies cough congestion or fever denies trauma denies rash.   Chest Pain      Home Medications Prior to Admission medications   Medication Sig Start Date End Date Taking? Authorizing Provider  diclofenac Sodium (VOLTAREN) 1 % GEL Apply 4 g topically 4 (four) times daily. 08/11/22  Yes Deno Etienne, DO  acetaminophen (TYLENOL) 325 MG tablet Take 2 tablets (650 mg total) by mouth every 4 (four) hours as needed for mild pain, headache or fever. 11/27/20   Cherene Altes, MD  albuterol (PROVENTIL HFA;VENTOLIN HFA) 108 (90 BASE) MCG/ACT inhaler Inhale 2 puffs into the lungs every 4 (four) hours as needed for wheezing or shortness of breath. Reported on 06/12/2016 04/30/12   Jonetta Osgood, MD  apixaban (ELIQUIS) 2.5 MG TABS tablet Take 2.5 mg by mouth 2 (two) times daily.    [provider]  bisacodyl (DULCOLAX) 10 MG suppository Place 1 suppository (10 mg total) rectally daily as needed for moderate constipation. 11/27/20   Cherene Altes, MD  bisoprolol (ZEBETA) 10 MG tablet Take 1 tablet (10 mg total) by mouth daily. 11/28/20   Cherene Altes, MD  bumetanide (BUMEX) 1 MG tablet Take 1 mg by mouth daily.    [provider]  cholecalciferol (VITAMIN D) 1000 units tablet Take 1,000 Units by mouth daily.     [provider]  docusate sodium (COLACE) 100 MG capsule Take 1 capsule (100 mg total) by mouth 2 (two) times daily. 11/27/20   Cherene Altes, MD  DULoxetine  (CYMBALTA) 60 MG capsule Take 60 mg by mouth daily.    [provider]  fexofenadine (ALLEGRA) 180 MG tablet Take 180 mg by mouth daily.    [provider]  gabapentin (NEURONTIN) 300 MG capsule Take 300 mg by mouth at bedtime.    [provider]  insulin aspart (NOVOLOG) 100 UNIT/ML injection Inject 0-5 Units into the skin at bedtime. Correction coverage: HS scale CBG < 70: implement hypoglycemia protocol CBG 70 - 120: 0 units CBG 121 - 150: 0 units CBG 151 - 200: 0 units CBG 201 - 250: 2 units CBG 251 - 300: 3 units CBG 301 - 350: 4 units CBG 351 - 400: 5 units CBG > 400 call MD. 11/28/20   Modena Jansky, MD  insulin aspart (NOVOLOG) 100 UNIT/ML injection Inject 0-20 Units into the skin 3 (three) times daily with meals. Correction coverage: Resistant (obese, steroids) CBG < 70: Implement Hypoglycemia protocol. CBG 70 - 120: 0 units CBG 121 - 150: 3 units CBG 151 - 200: 4 units CBG 201 - 250: 7 units CBG 251 - 300: 11 units CBG 301 - 350: 15 units CBG 351 - 400: 20 units CBG > 400 call MD. 11/28/20   Modena Jansky, MD  insulin glargine (LANTUS) 100 UNIT/ML injection Inject 0.54 mLs (54 Units total) into the skin daily. 11/28/20  Cherene Altes, MD  lisdexamfetamine (VYVANSE) 30 MG capsule Take 30 mg by mouth daily.    [provider]  methocarbamol (ROBAXIN) 500 MG tablet Take 1 tablet (500 mg total) by mouth every 8 (eight) hours as needed for muscle spasms. 11/17/20   Rachael Fee, PA-C  pantoprazole (PROTONIX) 20 MG tablet Take 20 mg by mouth daily.    [provider]  rosuvastatin (CRESTOR) 10 MG tablet Take 10 mg by mouth daily.    [provider]  senna-docusate (SENOKOT-S) 8.6-50 MG tablet Take 1 tablet by mouth at bedtime as needed for mild constipation. 11/27/20   Cherene Altes, MD  spironolactone (ALDACTONE) 25 MG tablet Take 2 tablets (50 mg total) by mouth daily. Reported on 04/09/2016 Patient taking  differently: Take 25 mg by mouth See admin instructions. Takes 1 tablet by mouth daily on Monday, Wednesday and Friday. 06/04/16   Angiulli, Lavon Paganini, PA-C      Allergies    Codeine, Lisinopril, and Latex    Review of Systems   Review of Systems  Cardiovascular:  Positive for chest pain.    Physical Exam Updated Vital Signs BP 125/66 (BP Location: Left Arm)   Pulse (!) 54   Temp 98.4 F (36.9 C) (Oral)   Resp 15   SpO2 98%  Physical Exam Vitals and nursing note reviewed.  Constitutional:      General: She is not in acute distress.    Appearance: She is well-developed. She is not diaphoretic.  HENT:     Head: Normocephalic and atraumatic.  Eyes:     Pupils: Pupils are equal, round, and reactive to light.  Cardiovascular:     Rate and Rhythm: Normal rate and regular rhythm.     Heart sounds: No murmur heard.    No friction rub. No gallop.  Pulmonary:     Effort: Pulmonary effort is normal.     Breath sounds: No wheezing or rales.  Chest:     Chest wall: Tenderness present.     Comments: Pain on palpation of the left lateral chest wall about the anterior axillary line about ribs 6 through 8 reproduce her tenderness Abdominal:     General: There is no distension.     Palpations: Abdomen is soft.     Tenderness: There is no abdominal tenderness.  Musculoskeletal:        General: No tenderness.     Cervical back: Normal range of motion and neck supple.  Skin:    General: Skin is warm and dry.  Neurological:     Mental Status: She is alert and oriented to person, place, and time.  Psychiatric:        Behavior: Behavior normal.     ED Results / Procedures / Treatments   Labs (all labs ordered are listed, but only abnormal results are displayed) Labs Reviewed  CBC WITH DIFFERENTIAL/PLATELET - Abnormal; Notable for the following components:      Result Value   WBC 11.8 (*)    RDW 15.6 (*)    Neutro Abs 9.8 (*)    All other components within normal limits  BASIC  METABOLIC PANEL - Abnormal; Notable for the following components:   Glucose, Bld 136 (*)    Creatinine, Ser 1.16 (*)    GFR, Estimated 52 (*)    All other components within normal limits  BRAIN NATRIURETIC PEPTIDE - Abnormal; Notable for the following components:   B Natriuretic Peptide 600.1 (*)  All other components within normal limits  TROPONIN I (HIGH SENSITIVITY)  TROPONIN I (HIGH SENSITIVITY)    EKG None  Radiology DG Chest 1 View  Result Date: 08/11/2022 CLINICAL DATA:  66 year old female with history of shortness of breath. EXAM: CHEST  1 VIEW COMPARISON:  Chest x-ray 04/07/2022. FINDINGS: There is cephalization of the pulmonary vasculature and slight indistinctness of the interstitial markings suggestive of mild pulmonary edema. No definite pleural effusions. Mild cardiomegaly. The patient is rotated to the left on today's exam, resulting in distortion of the mediastinal contours and reduced diagnostic sensitivity and specificity for mediastinal pathology. Atherosclerotic calcifications are noted in the thoracic aorta. IMPRESSION: 1. The appearance of the chest is suggestive of mild congestive heart failure, as above. 2. Aortic atherosclerosis. Electronically Signed   By: Vinnie Langton M.D.   On: 08/11/2022 05:44    Procedures Procedures    Medications Ordered in ED Medications  furosemide (LASIX) injection 40 mg (40 mg Intravenous Given 08/11/22 1231)    ED Course/ Medical Decision Making/ A&P                           Medical Decision Making Risk Prescription drug management.   66 yo F with a chief complaint of left-sided chest pain.  This been going on for a few days now.  Atypical in nature and reproduced on exam.  She has 2 troponins that are negative.  Chest x-ray independently interpreted by me without focal obstructive pneumothorax.  Patient is mild increased pulmonary edema compared to prior BNP is also mildly elevated.  Bolus dose of Lasix here.  PCP  follow-up.  4:03 PM:  I have discussed the diagnosis/risks/treatment options with the patient.  Evaluation and diagnostic testing in the emergency department does not suggest an emergent condition requiring admission or immediate intervention beyond what has been performed at this time.  They will follow up with  PCP. We also discussed returning to the ED immediately if new or worsening sx occur. We discussed the sx which are most concerning (e.g., sudden worsening pain, fever, inability to tolerate by mouth) that necessitate immediate return. Medications administered to the patient during their visit and any new prescriptions provided to the patient are listed below.  Medications given during this visit Medications  furosemide (LASIX) injection 40 mg (40 mg Intravenous Given 08/11/22 1231)     The patient appears reasonably screen and/or stabilized for discharge and I doubt any other medical condition or other [facility] requiring further screening, evaluation, or treatment in the ED at this time prior to discharge.          Final Clinical Impression(s) / ED Diagnoses Final diagnoses:  Nonspecific chest pain    Rx / DC Orders ED Discharge Orders          Ordered    diclofenac Sodium (VOLTAREN) 1 % GEL  4 times daily        08/11/22 [place], Icelynn Onken, DO 08/11/22 1603

## 2022-08-11 NOTE — Discharge Instructions (Signed)
Take Tylenol around-the-clock.  Use the gel as prescribed.  Follow-up with your family doctor and cardiologist in the office.

## 2022-08-11 NOTE — ED Notes (Signed)
Staff helped this patient to the restroom and back to the waiting room

## 2022-08-11 NOTE — ED Provider Triage Note (Signed)
Emergency Medicine Provider Triage Evaluation Note  Margaret [name] , a 66 y.o. female  was evaluated in triage.  Pt complains of chest pain started today, came on suddenly, pain is intermittent, will start in her left lower chest then go into the middle of her chest and sometimes feel into her neck neck, not described as a stabbing or tearing like sensation, currently does not have any chest pain at this time, no shortness of breath no pleuritic chest pain, she is on chronic O2 2 L via nasal cannula.  Review of Systems  Positive: Chest pain, neck pain Negative: Shortness of breath, cough  Physical Exam  BP 135/76 (BP Location: Left Wrist)   Pulse 63   Temp 99.7 F (37.6 C) (Oral)   Resp 18   SpO2 93%  Gen:   Awake, no distress   Resp:  Normal effort  MSK:   Moves extremities without difficulty  Other:    Medical Decision Making  Medically screening exam initiated at 5:18 AM.  Appropriate orders placed.  Margaret [name] was informed that the remainder of the evaluation will be completed by another provider, this initial triage assessment does not replace that evaluation, and the importance of remaining in the ED until their evaluation is complete.  Lab work imaging been ordered will need further work-up.   Marcello Fennel, PA-C 08/11/22 [phone]

## 2022-08-11 NOTE — ED Triage Notes (Signed)
Pt from home, around 3:30am she began having left sided chest pain that moves to the right shoulder.  Pt was given 324 ASA.    HR 64 SR CBG 125 94% on home 2L O2 150/70  20G R AC

## 2023-08-05 ENCOUNTER — Emergency Department (HOSPITAL_COMMUNITY): Payer: PRIVATE HEALTH INSURANCE

## 2023-08-05 ENCOUNTER — Other Ambulatory Visit: Payer: Self-pay

## 2023-08-05 ENCOUNTER — Emergency Department (HOSPITAL_COMMUNITY)
Admission: EM | Admit: 2023-08-05 | Discharge: 2023-08-06 | Disposition: A | Discharge status: Home / Self Care | Payer: No Typology Code available for payment source | Attending: Emergency Medicine | Admitting: Emergency Medicine

## 2023-08-05 ENCOUNTER — Encounter (HOSPITAL_COMMUNITY): Payer: Self-pay

## 2023-08-05 DIAGNOSIS — R1031 Right lower quadrant pain: Secondary | ICD-10-CM | POA: Insufficient documentation

## 2023-08-05 DIAGNOSIS — Z7901 Long term (current) use of anticoagulants: Secondary | ICD-10-CM | POA: Insufficient documentation

## 2023-08-05 DIAGNOSIS — S1191XA Laceration without foreign body of unspecified part of neck, initial encounter: Secondary | ICD-10-CM | POA: Insufficient documentation

## 2023-08-05 DIAGNOSIS — S62202A Unspecified fracture of first metacarpal bone, left hand, initial encounter for closed fracture: Secondary | ICD-10-CM | POA: Insufficient documentation

## 2023-08-05 DIAGNOSIS — M79642 Pain in left hand: Secondary | ICD-10-CM | POA: Diagnosis present

## 2023-08-05 DIAGNOSIS — R519 Headache, unspecified: Secondary | ICD-10-CM | POA: Insufficient documentation

## 2023-08-05 DIAGNOSIS — Z23 Encounter for immunization: Secondary | ICD-10-CM | POA: Insufficient documentation

## 2023-08-05 DIAGNOSIS — S51811A Laceration without foreign body of right forearm, initial encounter: Secondary | ICD-10-CM | POA: Insufficient documentation

## 2023-08-05 DIAGNOSIS — Z91040 Latex allergy status: Secondary | ICD-10-CM | POA: Diagnosis not present

## 2023-08-05 DIAGNOSIS — S62309A Unspecified fracture of unspecified metacarpal bone, initial encounter for closed fracture: Secondary | ICD-10-CM

## 2023-08-05 DIAGNOSIS — Z794 Long term (current) use of insulin: Secondary | ICD-10-CM | POA: Diagnosis not present

## 2023-08-05 LAB — I-STAT CHEM 8, ED
BUN: 14 mg/dL (ref 8–23)
Calcium, Ion: 1.31 mmol/L (ref 1.15–1.40)
Chloride: 101 mmol/L (ref 98–111)
Creatinine, Ser: 1 mg/dL (ref 0.44–1.00)
Glucose, Bld: 128 mg/dL — ABNORMAL HIGH (ref 70–99)
HCT: 48 % — ABNORMAL HIGH (ref 36.0–46.0)
Hemoglobin: 16.3 g/dL — ABNORMAL HIGH (ref 12.0–15.0)
Potassium: 3.7 mmol/L (ref 3.5–5.1)
Sodium: 141 mmol/L (ref 135–145)
TCO2: 29 mmol/L (ref 22–32)

## 2023-08-05 LAB — COMPREHENSIVE METABOLIC PANEL
ALT: 12 U/L (ref 0–44)
AST: 17 U/L (ref 15–41)
Albumin: 3.2 g/dL — ABNORMAL LOW (ref 3.5–5.0)
Alkaline Phosphatase: 100 U/L (ref 38–126)
Anion gap: 9 (ref 5–15)
BUN: 14 mg/dL (ref 8–23)
CO2: 27 mmol/L (ref 22–32)
Calcium: 9.9 mg/dL (ref 8.9–10.3)
Chloride: 102 mmol/L (ref 98–111)
Creatinine, Ser: 0.98 mg/dL (ref 0.44–1.00)
GFR, Estimated: >60 mL/min (ref >60)
Glucose, Bld: 129 mg/dL — ABNORMAL HIGH (ref 70–99)
Potassium: 3.9 mmol/L (ref 3.5–5.1)
Sodium: 138 mmol/L (ref 135–145)
Total Bilirubin: 1.1 mg/dL (ref 0.3–1.2)
Total Protein: 7.1 g/dL (ref 6.5–8.1)

## 2023-08-05 LAB — CBC WITH DIFFERENTIAL/PLATELET
Abs Immature Granulocytes: 0.04 10*3/uL (ref 0.00–0.07)
Basophils Absolute: 0.1 10*3/uL (ref 0.0–0.1)
Basophils Relative: 1 %
Eosinophils Absolute: 0.4 10*3/uL (ref 0.0–0.5)
Eosinophils Relative: 4 %
HCT: 45.1 % (ref 36.0–46.0)
Hemoglobin: 13.3 g/dL (ref 12.0–15.0)
Immature Granulocytes: 0 %
Lymphocytes Relative: 12 %
Lymphs Abs: 1.2 10*3/uL (ref 0.7–4.0)
MCH: 27.1 pg (ref 26.0–34.0)
MCHC: 29.5 g/dL — ABNORMAL LOW (ref 30.0–36.0)
MCV: 91.9 fL (ref 80.0–100.0)
Monocytes Absolute: 0.6 10*3/uL (ref 0.1–1.0)
Monocytes Relative: 5 %
Neutro Abs: 7.9 10*3/uL — ABNORMAL HIGH (ref 1.7–7.7)
Neutrophils Relative %: 78 %
Platelets: 359 10*3/uL (ref 150–400)
RBC: 4.91 MIL/uL (ref 3.87–5.11)
RDW: 15.4 % (ref 11.5–15.5)
WBC: 10.1 10*3/uL (ref 4.0–10.5)
nRBC: 0 % (ref 0.0–0.2)

## 2023-08-05 MED ORDER — IOHEXOL 350 MG/ML SOLN
75.0000 mL | Freq: Once | INTRAVENOUS | Status: AC | PRN
  Administered 2023-08-05: 75 mL via INTRAVENOUS

## 2023-08-05 MED ORDER — FENTANYL CITRATE PF 50 MCG/ML IJ SOSY
50.0000 ug | PREFILLED_SYRINGE | Freq: Once | INTRAMUSCULAR | Status: AC
  Administered 2023-08-05: 50 ug via INTRAVENOUS
  Filled 2023-08-05: qty 1

## 2023-08-05 NOTE — ED Provider Notes (Signed)
I saw and evaluated the patient, reviewed the resident's note and I agree with the findings and plan.  EKG Interpretation Date/Time:  Thursday August 05 2023 20:31:04 EDT Ventricular Rate:  70 PR Interval:  159 QRS Duration:  110 QT Interval:  373 QTC Calculation: 400 R Axis:   84  Text Interpretation: Sinus rhythm Consider left atrial enlargement Borderline right axis deviation Abnormal R-wave progression, early transition Nonspecific repol abnormality, diffuse leads Borderline ST elevation, anterior leads Confirmed by Lorre Nick ([id number]) on 08/05/2023 8:43:12 PM    Patient presents after losing her balance in her chair and falling and striking her head.  She is on blood thinners.  EKG per interpretation shows sinus rhythm.  Will check imaging as well as labs.       Lorre Nick, MD 08/05/23 2044

## 2023-08-05 NOTE — Progress Notes (Signed)
Orthopedic Tech Progress Note Patient Details:  Margaret [name] July 01, 1956 [id number]  Level 2 trauma   Patient ID: [name], female   DOB: 25-Aug-1956, 67 y.o.   MRN: [id number]  Donald Pore 08/05/2023, 10:15 PM

## 2023-08-05 NOTE — ED Triage Notes (Signed)
Per EMS  Unwitnessed fall  From wheelchair Hit head on bedframe Hematoma on front of head Left wrist pain (splint by Theatre stage manager) On eliquis  left side face laceration  EMS 138/84 Bp HR 89 RR 18 O2 96 CBG 184  Aox4 Stand and pivot. Uses wheelchair

## 2023-08-05 NOTE — ED Provider Notes (Signed)
[place] EMERGENCY DEPARTMENT AT [facility] Provider Note   CSN: [id number] Arrival date & time: 08/05/23  2010     History  No chief complaint on file.   Margaret [name] is a 67 y.o. female.  HPI Patient reports that she was trying to transfer into her wheelchair however she did not realize that the wheels were unlocked.  The wheelchair rolled out from underneath her and she fell.  She does report that she hit her head but does not believe that she lost consciousness.  She is currently reporting pain both in her head and in her left hand.    Home Medications Prior to Admission medications   Medication Sig Start Date End Date Taking? Authorizing Provider  acetaminophen (TYLENOL) 325 MG tablet Take 2 tablets (650 mg total) by mouth every 4 (four) hours as needed for mild pain, headache or fever. 11/27/20   Lonia Blood, MD  albuterol (PROVENTIL HFA;VENTOLIN HFA) 108 (90 BASE) MCG/ACT inhaler Inhale 2 puffs into the lungs every 4 (four) hours as needed for wheezing or shortness of breath. Reported on 06/12/2016 04/30/12   Maretta Bees, MD  apixaban (ELIQUIS) 2.5 MG TABS tablet Take 2.5 mg by mouth 2 (two) times daily.    [provider]  bisacodyl (DULCOLAX) 10 MG suppository Place 1 suppository (10 mg total) rectally daily as needed for moderate constipation. 11/27/20   Lonia Blood, MD  bisoprolol (ZEBETA) 10 MG tablet Take 1 tablet (10 mg total) by mouth daily. 11/28/20   Lonia Blood, MD  bumetanide (BUMEX) 1 MG tablet Take 1 mg by mouth daily.    [provider]  cholecalciferol (VITAMIN D) 1000 units tablet Take 1,000 Units by mouth daily.     [provider]  diclofenac Sodium (VOLTAREN) 1 % GEL Apply 4 g topically 4 (four) times daily. 08/11/22   Melene Plan, DO  docusate sodium (COLACE) 100 MG capsule Take 1 capsule (100 mg total) by mouth 2 (two) times daily. 11/27/20   Lonia Blood, MD  DULoxetine (CYMBALTA) 60 MG  capsule Take 60 mg by mouth daily.    [provider]  fexofenadine (ALLEGRA) 180 MG tablet Take 180 mg by mouth daily.    [provider]  gabapentin (NEURONTIN) 300 MG capsule Take 300 mg by mouth at bedtime.    [provider]  insulin aspart (NOVOLOG) 100 UNIT/ML injection Inject 0-5 Units into the skin at bedtime. Correction coverage: HS scale CBG < 70: implement hypoglycemia protocol CBG 70 - 120: 0 units CBG 121 - 150: 0 units CBG 151 - 200: 0 units CBG 201 - 250: 2 units CBG 251 - 300: 3 units CBG 301 - 350: 4 units CBG 351 - 400: 5 units CBG > 400 call MD. 11/28/20   Elease Etienne, MD  insulin aspart (NOVOLOG) 100 UNIT/ML injection Inject 0-20 Units into the skin 3 (three) times daily with meals. Correction coverage: Resistant (obese, steroids) CBG < 70: Implement Hypoglycemia protocol. CBG 70 - 120: 0 units CBG 121 - 150: 3 units CBG 151 - 200: 4 units CBG 201 - 250: 7 units CBG 251 - 300: 11 units CBG 301 - 350: 15 units CBG 351 - 400: 20 units CBG > 400 call MD. 11/28/20   Elease Etienne, MD  insulin glargine (LANTUS) 100 UNIT/ML injection Inject 0.54 mLs (54 Units total) into the skin daily. 11/28/20   Lonia Blood, MD  lisdexamfetamine Arlyce Harman)  30 MG capsule Take 30 mg by mouth daily.    [provider]  methocarbamol (ROBAXIN) 500 MG tablet Take 1 tablet (500 mg total) by mouth every 8 (eight) hours as needed for muscle spasms. 11/17/20   Rainey Pines, PA-C  pantoprazole (PROTONIX) 20 MG tablet Take 20 mg by mouth daily.    [provider]  rosuvastatin (CRESTOR) 10 MG tablet Take 10 mg by mouth daily.    [provider]  senna-docusate (SENOKOT-S) 8.6-50 MG tablet Take 1 tablet by mouth at bedtime as needed for mild constipation. 11/27/20   Lonia Blood, MD  spironolactone (ALDACTONE) 25 MG tablet Take 2 tablets (50 mg total) by mouth daily. Reported on 04/09/2016 Patient taking differently: Take 25 mg  by mouth See admin instructions. Takes 1 tablet by mouth daily on Monday, Wednesday and Friday. 06/04/16   Angiulli, Mcarthur Rossetti, PA-C      Allergies    Codeine, Lisinopril, and Latex    Review of Systems   Review of Systems  Physical Exam Updated Vital Signs BP (!) 136/56 (BP Location: Right Arm)   Pulse 72   Temp 98.7 F (37.1 C) (Oral)   Resp 19   Ht 5\' 4"  (1.626 m)   Wt 118.8 kg   SpO2 97%   BMI 44.97 kg/m  Physical Exam Vitals and nursing note reviewed.  Constitutional:      Appearance: She is well-developed.  HENT:     Head: Normocephalic.     Comments: Bruising present lateral to the left eye Eyes:     Conjunctiva/sclera: Conjunctivae normal.     Pupils: Pupils are equal, round, and reactive to light.  Neck:     Comments: Small laceration present to the left side of the neck Cardiovascular:     Rate and Rhythm: Normal rate and regular rhythm.     Pulses: Normal pulses.     Heart sounds: No murmur heard. Pulmonary:     Effort: Pulmonary effort is normal. No respiratory distress.  Chest:     Chest wall: No tenderness.  Abdominal:     Palpations: Abdomen is soft.     Tenderness: There is abdominal tenderness (Mild right lower quadrant tenderness).  Musculoskeletal:     Cervical back: Neck supple.     Comments: Tenderness and limited motion of the left hand  Skin:    General: Skin is warm and dry.     Capillary Refill: Capillary refill takes less than 2 seconds.     Comments: Small skin tear present to the right forearm  Neurological:     General: No focal deficit present.     Mental Status: She is alert and oriented to person, place, and time.  Psychiatric:        Mood and Affect: Mood normal.    ED Results / Procedures / Treatments   Labs (all labs ordered are listed, but only abnormal results are displayed) Labs Reviewed  CBC WITH DIFFERENTIAL/PLATELET - Abnormal; Notable for the following components:      Result Value   MCHC 29.5 (*)    Neutro Abs  7.9 (*)    All other components within normal limits  COMPREHENSIVE METABOLIC PANEL - Abnormal; Notable for the following components:   Glucose, Bld 129 (*)    Albumin 3.2 (*)    All other components within normal limits  I-STAT CHEM 8, ED - Abnormal; Notable for the following components:   Glucose, Bld 128 (*)  Hemoglobin 16.3 (*)    HCT 48.0 (*)    All other components within normal limits    EKG EKG Interpretation Date/Time:  Thursday August 05 2023 20:31:04 EDT Ventricular Rate:  70 PR Interval:  159 QRS Duration:  110 QT Interval:  373 QTC Calculation: 400 R Axis:   84  Text Interpretation: Sinus rhythm Consider left atrial enlargement Borderline right axis deviation Abnormal R-wave progression, early transition Nonspecific repol abnormality, diffuse leads Borderline ST elevation, anterior leads Confirmed by Lorre Nick ([id number]) on 08/05/2023 8:43:12 PM  Radiology CT CHEST ABDOMEN PELVIS W CONTRAST  Result Date: 08/05/2023 CLINICAL DATA:  Abdominal trauma, blunt.  Unwitnessed fall. EXAM: CT CHEST, ABDOMEN, AND PELVIS WITH CONTRAST TECHNIQUE: Multidetector CT imaging of the chest, abdomen and pelvis was performed following the standard protocol during bolus administration of intravenous contrast. RADIATION DOSE REDUCTION: This exam was performed according to the departmental dose-optimization program which includes automated exposure control, adjustment of the mA and/or kV according to patient size and/or use of iterative reconstruction technique. CONTRAST:  75mL OMNIPAQUE IOHEXOL 350 MG/ML SOLN COMPARISON:  03/28/2018, 12/30/2022. FINDINGS: CT CHEST FINDINGS Cardiovascular: The heart is enlarged and there is no pericardial effusion. Scattered coronary artery calcifications are noted. There is atherosclerotic calcification of the aorta without evidence of aneurysm. The pulmonary trunk is distended suggesting underlying pulmonary artery hypertension. Mediastinum/Nodes: Multiple  enlarged mediastinal lymph nodes are noted measuring up to 1.7 cm in the subcarinal space. No hilar or axillary lymphadenopathy. The thyroid gland, trachea, and esophagus are within normal limits. Lungs/Pleura: Interstitial prominence and ground-glass opacities are present in the lungs bilaterally, greater on the right than on the left. There is a cavitary nodule in the right upper lobe measuring 2.0 x 1.2 cm. No effusion or pneumothorax. Musculoskeletal: Sternotomy wires are noted. Degenerative changes are present in the thoracic spine. No acute fracture. CT ABDOMEN PELVIS FINDINGS Hepatobiliary: No focal liver abnormality is seen. No gallstones, gallbladder wall thickening, or biliary dilatation. Pancreas: Unremarkable. No pancreatic ductal dilatation or surrounding inflammatory changes. Spleen: Normal size.  Stable lobular contour. Adrenals/Urinary Tract: The adrenal glands are within normal limits. The kidneys enhance symmetrically. There is a cyst in the mid left kidney. Nonobstructive renal calculi are present on the left. No hydroureteronephrosis bilaterally. The bladder is unremarkable. Stomach/Bowel: Stomach is within normal limits. Appendix is not seen. No evidence of bowel wall thickening, distention, or inflammatory changes. No free air or pneumatosis. Vascular/Lymphatic: Aortic atherosclerosis. A prominent lymph node is seen in the gastrohepatic ligament and porta hepatis. Reproductive: The uterus has a lobular contour suggesting underlying fibroid. No adnexal mass. Other: No abdominopelvic ascites. A fat containing umbilical hernia is noted. Subcutaneous fat stranding is noted in the left lateral abdominal wall and in the low anterior abdominal wall. No hematoma is seen. Musculoskeletal: Fixation hardware is present in the proximal left femur. No acute fracture is seen. IMPRESSION: 1. No evidence of acute fracture or solid organ injury. 2. Interstitial and ground-glass opacities in the lungs  bilaterally, seen on prior exams and may be related to interstitial lung disease. 3. Stable cavitary lesion in the right upper lobe measuring 2.0 x 1.2 cm, which assessed by PET-CT 12/30/2022. Continued surveillance is recommended. 4. Nonobstructive left renal calculi. 5. Distended pulmonary trunk suggesting underlying pulmonary artery hypertension. 6. Uterine fibroid. 7. Aortic atherosclerosis with coronary artery calcifications. Electronically Signed   By: Thornell Sartorius M.D.   On: 08/05/2023 22:57   CT Cervical Spine Wo Contrast  Result Date: 08/05/2023  CLINICAL DATA:  Neck trauma EXAM: CT CERVICAL SPINE WITHOUT CONTRAST TECHNIQUE: Multidetector CT imaging of the cervical spine was performed without intravenous contrast. Multiplanar CT image reconstructions were also generated. RADIATION DOSE REDUCTION: This exam was performed according to the departmental dose-optimization program which includes automated exposure control, adjustment of the mA and/or kV according to patient size and/or use of iterative reconstruction technique. COMPARISON:  None Available. FINDINGS: Alignment: Normal. Skull base and vertebrae: No acute fracture. No primary bone lesion or focal pathologic process. The bones are diffusely osteopenic. Soft tissues and spinal canal: No prevertebral fluid or swelling. No visible canal hematoma. Disc levels: There is disc space narrowing and endplate osteophyte formation most significant at C5-C6 and C6-C7 compatible with degenerative change. There is no severe central canal or neural foraminal stenosis at any level. Upper chest: There some ground-glass opacities in interlobular septal thickening in the right upper lobe and minimally in the left upper lobe. Nodular densities are seen in the left upper lobe measuring up to 3 mm. Other: There are enlarged prevascular lymph nodes measuring up to 14 mm. There is soft tissue air and stranding in the lateral left neck, likely posttraumatic. IMPRESSION:  1. No acute fracture or traumatic subluxation of the cervical spine. 2. Soft tissue air and stranding in the lateral left neck, likely posttraumatic. 3. Ground-glass opacities and interlobular septal thickening in the upper lobes, right greater than left, may be infectious/inflammatory. 4. Left upper lobe pulmonary nodules measuring up to 3 mm. 5. Prevascular mediastinal lymphadenopathy of uncertain etiology. Electronically Signed   By: Darliss Cheney M.D.   On: 08/05/2023 22:40   CT Head Wo Contrast  Result Date: 08/05/2023 CLINICAL DATA:  Head trauma EXAM: CT HEAD WITHOUT CONTRAST TECHNIQUE: Contiguous axial images were obtained from the base of the skull through the vertex without intravenous contrast. RADIATION DOSE REDUCTION: This exam was performed according to the departmental dose-optimization program which includes automated exposure control, adjustment of the mA and/or kV according to patient size and/or use of iterative reconstruction technique. COMPARISON:  Head CT 11/14/2020.  MRI brain 02/07/2016 FINDINGS: Brain: No evidence of acute infarction, hemorrhage, hydrocephalus, extra-axial collection or mass lesion/mass effect. Small old cortical infarcts are seen in the right parietal lobe corresponding to MRI findings. There is mild periventricular white matter hypodensity, likely chronic small vessel ischemic change. There is an old lacunar infarct in the right basal ganglia. Vascular: Atherosclerotic calcifications are present within the cavernous internal carotid arteries. Skull: Normal. Negative for fracture or focal lesion. Sinuses/Orbits: No acute finding. Other: There is soft tissue swelling overlying the right parietal scalp and lateral to the left orbit. IMPRESSION: 1. No acute intracranial process. 2. Soft tissue swelling overlying the right parietal scalp and lateral to the left orbit. Electronically Signed   By: Darliss Cheney M.D.   On: 08/05/2023 22:36   DG Hand Complete Left  Result  Date: 08/05/2023 CLINICAL DATA:  Unwitnessed fall EXAM: LEFT HAND - COMPLETE 3+ VIEW COMPARISON:  None Available. FINDINGS: Limited by positioning with flexion of the digits. Osseous demineralization. Acute mildly displaced fractures involving the necks of the first second and third metacarpals with mild volar angulation of second metacarpal head. IMPRESSION: Acute mildly displaced fractures involving the necks of the first second and third metacarpals. Osseous demineralization and limited positioning. Electronically Signed   By: Jasmine Pang M.D.   On: 08/05/2023 22:13   DG Forearm Left  Result Date: 08/05/2023 CLINICAL DATA:  Fall with hematoma to head EXAM:  LEFT FOREARM - 2 VIEW COMPARISON:  None Available. FINDINGS: No acute fracture or malalignment. Mild heterogeneous lucency at the distal shaft and metaphysis of radius. IMPRESSION: 1. No acute osseous abnormality. 2. Mild heterogeneous lucency at the distal radius, this is non specific, but correlate for any history of marrow disease. Electronically Signed   By: Jasmine Pang M.D.   On: 08/05/2023 22:11   DG Humerus Left  Result Date: 08/05/2023 CLINICAL DATA:  One witnessed fall EXAM: LEFT HUMERUS - 2+ VIEW COMPARISON:  None Available. FINDINGS: No acute displaced fracture or malalignment. Soft tissues are unremarkable IMPRESSION: No acute osseous abnormality Electronically Signed   By: Jasmine Pang M.D.   On: 08/05/2023 22:08   DG Pelvis Portable  Result Date: 08/05/2023 CLINICAL DATA:  Fall EXAM: PORTABLE PELVIS 1-2 VIEWS COMPARISON:  Abdominal x-ray 04/23/2017 FINDINGS: Left-sided hip screw and intramedullary nail are present. There is no evidence for hardware complication, acute fracture or dislocation. There is heterotopic ossification lateral to the left hip. There are mild degenerative changes of both hips. IMPRESSION: 1. No acute fracture or dislocation. 2. Left hip screw and intramedullary nail without evidence for hardware  complication. Electronically Signed   By: Darliss Cheney M.D.   On: 08/05/2023 22:08   DG Humerus Right  Result Date: 08/05/2023 CLINICAL DATA:  Fall and trauma to the right upper extremity. EXAM: RIGHT HUMERUS - 2+ VIEW; RIGHT FOREARM - 2 VIEW COMPARISON:  None Available. FINDINGS: Evaluation of the elbow is limited as a true 90 degree lateral view is not provided as well as due to overlying blood pressure cough. No definite acute fracture or dislocation. Apparent faint linear lucency in the lateral epicondyle, likely artifactual. The bones are well mineralized. No significant arthritic changes. The soft tissues are unremarkable. IMPRESSION: No acute fracture or dislocation. Electronically Signed   By: Elgie Collard M.D.   On: 08/05/2023 22:08   DG Forearm Right  Result Date: 08/05/2023 CLINICAL DATA:  Fall and trauma to the right upper extremity. EXAM: RIGHT HUMERUS - 2+ VIEW; RIGHT FOREARM - 2 VIEW COMPARISON:  None Available. FINDINGS: Evaluation of the elbow is limited as a true 90 degree lateral view is not provided as well as due to overlying blood pressure cough. No definite acute fracture or dislocation. Apparent faint linear lucency in the lateral epicondyle, likely artifactual. The bones are well mineralized. No significant arthritic changes. The soft tissues are unremarkable. IMPRESSION: No acute fracture or dislocation. Electronically Signed   By: Elgie Collard M.D.   On: 08/05/2023 22:08   DG Chest Portable 1 View  Result Date: 08/05/2023 CLINICAL DATA:  Fall. EXAM: PORTABLE CHEST 1 VIEW COMPARISON:  Chest radiograph dated 04/08/2023 FINDINGS: There is diffuse chronic antral coarsening. No new consolidation. No pleural effusion pneumothorax. Stable cardiomegaly. Median sternotomy wires. Osteopenia with degenerative changes of the spine. No acute osseous pathology. IMPRESSION: 1. No acute cardiopulmonary process. 2. Stable cardiomegaly. Electronically Signed   By: Elgie Collard M.D.    On: 08/05/2023 22:05    Procedures Procedures    Medications Ordered in ED Medications  iohexol (OMNIPAQUE) 350 MG/ML injection 75 mL (75 mLs Intravenous Contrast Given 08/05/23 2135)  fentaNYL (SUBLIMAZE) injection 50 mcg (50 mcg Intravenous Given 08/05/23 2342)  Tdap (BOOSTRIX) injection 0.5 mL (0.5 mLs Intramuscular Given 08/06/23 0108)    ED Course/ Medical Decision Making/ A&P  Medical Decision Making Amount and/or Complexity of Data Reviewed Labs: ordered. Radiology: ordered.  Risk Prescription drug management.   Patient is a 67 year old female with a history of CHF, hypertension, prior CVA, A-fib on Eliquis, COPD, cirrhosis, T2DM presenting following a fall.  On my initial evaluation, she is afebrile, hemodynamically stable, in no acute distress.  As she had a fall with evidence of head trauma on blood thinners, she was a level 2 trauma on arrival.  She reports mechanical fall as her wheelchair was unlocked when she attempted to sit in it.  On exam, she has some bruising about the left side of her face as well as a small laceration to the left side of her neck and skin tears present to the right upper extremity.  There is tenderness and deformity of the left hand.  Presentation was concerning for traumatic injuries including intracranial hemorrhage, skull fracture, C-spine fracture, spinal fracture, blunt injury within the chest, abdomen, pelvis.  Will obtain trauma CT scans to further evaluate.  Additionally since labs to assess for electrolyte or metabolic abnormality.  CBC without leukocytosis or anemia.  CMP with overall normal electrolytes, no AKI or anion gap.  CT scans overall negative for traumatic injury.  Left hand x-ray demonstrates fractures of the necks of the first second and third metacarpals.  Given multiple hand fractures, did speak with hand surgery.  On their review of imaging, feel that she is appropriate for ED splinting and  outpatient follow-up.  Recommending thumb spica with radial gutter and volar slab.  Splint was applied to ED.  Following this, she has neurovascularly intact.  She has not had any change to vital signs or mental status throughout her ED course.  Feel she is appropriate for discharge outpatient follow-up.  No further acute event while under my care in the emergency department.        Final Clinical Impression(s) / ED Diagnoses Final diagnoses:  Closed fracture of metacarpal bone, unspecified fracture morphology, unspecified metacarpal, unspecified portion of metacarpal, initial encounter    Rx / DC Orders ED Discharge Orders     None         Claretha Cooper, DO 08/06/23 2207    Lorre Nick, MD 08/09/23 1005

## 2023-08-06 MED ORDER — TETANUS-DIPHTH-ACELL PERTUSSIS 5-2.5-18.5 LF-MCG/0.5 IM SUSY
0.5000 mL | PREFILLED_SYRINGE | Freq: Once | INTRAMUSCULAR | Status: AC
  Administered 2023-08-06: 0.5 mL via INTRAMUSCULAR
  Filled 2023-08-06: qty 0.5

## 2023-08-06 NOTE — Progress Notes (Signed)
Orthopedic Tech Progress Note Patient Details:  Margaret [name] Apr 11, 1956 [id number]  Ortho Devices Type of Ortho Device: Ace wrap, Cotton web roll, Thumb spica splint, Volar splint Splint Material: Fiberglass Ortho Device/Splint Location: LUE Ortho Device/Splint Interventions: Ordered, Application   Post Interventions Patient Tolerated: Fair Instructions Provided: Care of device  Donald Pore 08/06/2023, 12:15 AM

## 2023-08-07 ENCOUNTER — Emergency Department (HOSPITAL_COMMUNITY): Payer: No Typology Code available for payment source

## 2023-08-07 ENCOUNTER — Emergency Department (HOSPITAL_COMMUNITY)
Admission: EM | Admit: 2023-08-07 | Discharge: 2023-08-09 | Disposition: A | Discharge status: Home / Self Care | Payer: No Typology Code available for payment source | Attending: Emergency Medicine | Admitting: Emergency Medicine

## 2023-08-07 DIAGNOSIS — I110 Hypertensive heart disease with heart failure: Secondary | ICD-10-CM | POA: Insufficient documentation

## 2023-08-07 DIAGNOSIS — R2243 Localized swelling, mass and lump, lower limb, bilateral: Secondary | ICD-10-CM | POA: Diagnosis not present

## 2023-08-07 DIAGNOSIS — Z7901 Long term (current) use of anticoagulants: Secondary | ICD-10-CM | POA: Diagnosis not present

## 2023-08-07 DIAGNOSIS — M79642 Pain in left hand: Secondary | ICD-10-CM

## 2023-08-07 DIAGNOSIS — J449 Chronic obstructive pulmonary disease, unspecified: Secondary | ICD-10-CM | POA: Insufficient documentation

## 2023-08-07 DIAGNOSIS — E119 Type 2 diabetes mellitus without complications: Secondary | ICD-10-CM | POA: Insufficient documentation

## 2023-08-07 DIAGNOSIS — R1030 Lower abdominal pain, unspecified: Secondary | ICD-10-CM

## 2023-08-07 DIAGNOSIS — Z794 Long term (current) use of insulin: Secondary | ICD-10-CM | POA: Insufficient documentation

## 2023-08-07 DIAGNOSIS — I509 Heart failure, unspecified: Secondary | ICD-10-CM | POA: Diagnosis not present

## 2023-08-07 DIAGNOSIS — W050XXA Fall from non-moving wheelchair, initial encounter: Secondary | ICD-10-CM | POA: Diagnosis not present

## 2023-08-07 DIAGNOSIS — Z7409 Other reduced mobility: Secondary | ICD-10-CM | POA: Diagnosis not present

## 2023-08-07 LAB — URINALYSIS, ROUTINE W REFLEX MICROSCOPIC
Glucose, UA: NEGATIVE mg/dL
Ketones, ur: NEGATIVE mg/dL
Leukocytes,Ua: NEGATIVE
Nitrite: NEGATIVE
Protein, ur: 100 mg/dL — AB
Specific Gravity, Urine: 1.025 (ref 1.005–1.030)
pH: 6 (ref 5.0–8.0)

## 2023-08-07 LAB — CBC WITH DIFFERENTIAL/PLATELET
Abs Immature Granulocytes: 0.02 10*3/uL (ref 0.00–0.07)
Basophils Absolute: 0.1 10*3/uL (ref 0.0–0.1)
Basophils Relative: 1 %
Eosinophils Absolute: 0.2 10*3/uL (ref 0.0–0.5)
Eosinophils Relative: 3 %
HCT: 44.9 % (ref 36.0–46.0)
Hemoglobin: 13.3 g/dL (ref 12.0–15.0)
Immature Granulocytes: 0 %
Lymphocytes Relative: 13 %
Lymphs Abs: 1.1 10*3/uL (ref 0.7–4.0)
MCH: 28.3 pg (ref 26.0–34.0)
MCHC: 29.6 g/dL — ABNORMAL LOW (ref 30.0–36.0)
MCV: 95.5 fL (ref 80.0–100.0)
Monocytes Absolute: 0.6 10*3/uL (ref 0.1–1.0)
Monocytes Relative: 7 %
Neutro Abs: 6.7 10*3/uL (ref 1.7–7.7)
Neutrophils Relative %: 76 %
Platelets: 308 10*3/uL (ref 150–400)
RBC: 4.7 MIL/uL (ref 3.87–5.11)
RDW: 15.7 % — ABNORMAL HIGH (ref 11.5–15.5)
WBC: 8.7 10*3/uL (ref 4.0–10.5)
nRBC: 0 % (ref 0.0–0.2)

## 2023-08-07 LAB — LIPASE, BLOOD: Lipase: 25 U/L (ref 11–51)

## 2023-08-07 LAB — COMPREHENSIVE METABOLIC PANEL
ALT: 11 U/L (ref 0–44)
AST: 10 U/L — ABNORMAL LOW (ref 15–41)
Albumin: 2.6 g/dL — ABNORMAL LOW (ref 3.5–5.0)
Alkaline Phosphatase: 93 U/L (ref 38–126)
Anion gap: 8 (ref 5–15)
BUN: 9 mg/dL (ref 8–23)
CO2: 25 mmol/L (ref 22–32)
Calcium: 8.8 mg/dL — ABNORMAL LOW (ref 8.9–10.3)
Chloride: 104 mmol/L (ref 98–111)
Creatinine, Ser: 0.83 mg/dL (ref 0.44–1.00)
GFR, Estimated: >60 mL/min (ref >60)
Glucose, Bld: 133 mg/dL — ABNORMAL HIGH (ref 70–99)
Potassium: 3.8 mmol/L (ref 3.5–5.1)
Sodium: 137 mmol/L (ref 135–145)
Total Bilirubin: 1.3 mg/dL — ABNORMAL HIGH (ref 0.3–1.2)
Total Protein: 5.8 g/dL — ABNORMAL LOW (ref 6.5–8.1)

## 2023-08-07 LAB — MAGNESIUM: Magnesium: 1.6 mg/dL — ABNORMAL LOW (ref 1.7–2.4)

## 2023-08-07 LAB — URINALYSIS, MICROSCOPIC (REFLEX)

## 2023-08-07 MED ORDER — INSULIN ASPART 100 UNIT/ML IJ SOLN
0.0000 [IU] | Freq: Three times a day (TID) | INTRAMUSCULAR | Status: DC
  Administered 2023-08-08 (×2): 3 [IU] via SUBCUTANEOUS

## 2023-08-07 MED ORDER — LACTATED RINGERS IV BOLUS
500.0000 mL | Freq: Once | INTRAVENOUS | Status: AC
  Administered 2023-08-07: 500 mL via INTRAVENOUS

## 2023-08-07 MED ORDER — BISOPROLOL FUMARATE 10 MG PO TABS
10.0000 mg | ORAL_TABLET | Freq: Every day | ORAL | Status: DC
  Administered 2023-08-07 – 2023-08-09 (×3): 10 mg via ORAL
  Filled 2023-08-07 (×3): qty 1

## 2023-08-07 MED ORDER — ROSUVASTATIN CALCIUM 5 MG PO TABS: 10.0000 mg | ORAL_TABLET | Freq: Every day | ORAL | Status: DC

## 2023-08-07 MED ORDER — ONDANSETRON 4 MG PO TBDP
4.0000 mg | ORAL_TABLET | Freq: Once | ORAL | Status: AC
  Administered 2023-08-07: 4 mg via ORAL
  Filled 2023-08-07: qty 1

## 2023-08-07 MED ORDER — SENNOSIDES-DOCUSATE SODIUM 8.6-50 MG PO TABS: 1.0000 | ORAL_TABLET | Freq: Every evening | ORAL | Status: DC | PRN

## 2023-08-07 MED ORDER — CEPHALEXIN 250 MG PO CAPS
500.0000 mg | ORAL_CAPSULE | Freq: Four times a day (QID) | ORAL | Status: DC
  Administered 2023-08-08 – 2023-08-09 (×3): 500 mg via ORAL
  Filled 2023-08-07 (×3): qty 2

## 2023-08-07 MED ORDER — INSULIN DETEMIR 100 UNIT/ML ~~LOC~~ SOLN
20.0000 [IU] | Freq: Every day | SUBCUTANEOUS | Status: DC
  Administered 2023-08-08: 20 [IU] via SUBCUTANEOUS
  Filled 2023-08-07 (×2): qty 0.2

## 2023-08-07 MED ORDER — BUMETANIDE 1 MG PO TABS
1.0000 mg | ORAL_TABLET | Freq: Every day | ORAL | Status: DC
  Administered 2023-08-07 – 2023-08-09 (×3): 1 mg via ORAL
  Filled 2023-08-07 (×3): qty 1

## 2023-08-07 MED ORDER — IOHEXOL 350 MG/ML SOLN: 75.0000 mL | Freq: Once | INTRAVENOUS | Status: DC | PRN

## 2023-08-07 MED ORDER — PANTOPRAZOLE SODIUM 20 MG PO TBEC: 20.0000 mg | DELAYED_RELEASE_TABLET | Freq: Every day | ORAL | Status: DC

## 2023-08-07 MED ORDER — IOHEXOL 350 MG/ML SOLN
75.0000 mL | Freq: Once | INTRAVENOUS | Status: AC | PRN
  Administered 2023-08-07: 75 mL via INTRAVENOUS

## 2023-08-07 MED ORDER — OXYCODONE-ACETAMINOPHEN 5-325 MG PO TABS
1.0000 | ORAL_TABLET | Freq: Once | ORAL | Status: AC
  Administered 2023-08-07: 1 via ORAL
  Filled 2023-08-07: qty 1

## 2023-08-07 MED ORDER — ROSUVASTATIN CALCIUM 5 MG PO TABS
10.0000 mg | ORAL_TABLET | Freq: Every day | ORAL | Status: DC
  Administered 2023-08-08 – 2023-08-09 (×2): 10 mg via ORAL
  Filled 2023-08-07 (×2): qty 2

## 2023-08-07 MED ORDER — SODIUM CHLORIDE 0.9 % IV SOLN
1.0000 g | Freq: Once | INTRAVENOUS | Status: AC
  Administered 2023-08-07: 1 g via INTRAVENOUS
  Filled 2023-08-07: qty 10

## 2023-08-07 MED ORDER — LISDEXAMFETAMINE DIMESYLATE 30 MG PO CAPS: 30.0000 mg | ORAL_CAPSULE | Freq: Every day | ORAL | Status: DC

## 2023-08-07 MED ORDER — INSULIN ASPART 100 UNIT/ML IJ SOLN: 0.0000 [IU] | Freq: Every day | INTRAMUSCULAR | Status: DC

## 2023-08-07 MED ORDER — APIXABAN 2.5 MG PO TABS
2.5000 mg | ORAL_TABLET | Freq: Two times a day (BID) | ORAL | Status: DC
  Administered 2023-08-07 – 2023-08-09 (×4): 2.5 mg via ORAL
  Filled 2023-08-07 (×4): qty 1

## 2023-08-07 MED ORDER — MAGNESIUM SULFATE 2 GM/50ML IV SOLN
2.0000 g | Freq: Once | INTRAVENOUS | Status: AC
  Administered 2023-08-07: 2 g via INTRAVENOUS
  Filled 2023-08-07: qty 50

## 2023-08-07 MED ORDER — GABAPENTIN 300 MG PO CAPS
300.0000 mg | ORAL_CAPSULE | Freq: Every day | ORAL | Status: DC
  Administered 2023-08-07 – 2023-08-08 (×2): 300 mg via ORAL
  Filled 2023-08-07 (×2): qty 1

## 2023-08-07 MED ORDER — PANTOPRAZOLE SODIUM 20 MG PO TBEC
20.0000 mg | DELAYED_RELEASE_TABLET | Freq: Every day | ORAL | Status: DC
  Administered 2023-08-08 – 2023-08-09 (×2): 20 mg via ORAL
  Filled 2023-08-07 (×2): qty 1

## 2023-08-07 MED ORDER — DULOXETINE HCL 30 MG PO CPEP: 60.0000 mg | ORAL_CAPSULE | Freq: Every day | ORAL | Status: DC

## 2023-08-07 MED ORDER — DULOXETINE HCL 30 MG PO CPEP
60.0000 mg | ORAL_CAPSULE | Freq: Every day | ORAL | Status: DC
  Administered 2023-08-07 – 2023-08-09 (×3): 60 mg via ORAL
  Filled 2023-08-07 (×3): qty 2

## 2023-08-07 MED ORDER — ALBUTEROL SULFATE HFA 108 (90 BASE) MCG/ACT IN AERS: 2.0000 | INHALATION_SPRAY | RESPIRATORY_TRACT | Status: DC | PRN

## 2023-08-07 MED ORDER — OXYCODONE-ACETAMINOPHEN 5-325 MG PO TABS
1.0000 | ORAL_TABLET | ORAL | Status: DC | PRN
  Administered 2023-08-08 (×2): 1 via ORAL
  Filled 2023-08-07 (×2): qty 1

## 2023-08-07 MED ORDER — ALBUTEROL SULFATE (2.5 MG/3ML) 0.083% IN NEBU: 2.5000 mg | INHALATION_SOLUTION | RESPIRATORY_TRACT | Status: DC | PRN

## 2023-08-07 MED ORDER — GABAPENTIN 300 MG PO CAPS: 300.0000 mg | ORAL_CAPSULE | Freq: Every day | ORAL | Status: DC

## 2023-08-07 MED ORDER — LISDEXAMFETAMINE DIMESYLATE 30 MG PO CAPS
30.0000 mg | ORAL_CAPSULE | Freq: Every day | ORAL | Status: DC
  Administered 2023-08-08 – 2023-08-09 (×2): 30 mg via ORAL
  Filled 2023-08-07 (×2): qty 1

## 2023-08-07 NOTE — ED Notes (Signed)
Pt declines blankets at this time

## 2023-08-07 NOTE — ED Provider Notes (Addendum)
[facility] AT [facility] Provider Note   CSN: [id number] Arrival date & time: 08/07/23  1455     History  Chief Complaint  Patient presents with   Hand Pain    Margaret [name] is a 67 y.o. female.  HPI Patient presents for abdominal pain, nausea, and p.o. intolerance.  Medical history includes COPD, CHF, obesity, HTN, DM, HLD, cirrhosis, CVA, depression, anxiety, arthritis.  Currently, she lives alone.  Her husband has recently been moved into a hospice facility.  She has neuropathy in her legs and gets around in her home in a wheelchair.  She was seen in the ED 2 days ago after a fall from wheelchair.  During this fall, she injured her left hand.  X-ray imaging from few days ago showed acute mildly displaced fractures involving necks of metacarpals 1-3.  This was discussed with hand surgery at the time who recommended splinting and follow-up.  She was provided with thumb spica splint.  She has been taking Tylenol only for pain at home.  She states that she has had uncontrolled pain in addition to nausea.  She states that she has not been able to eat today or yesterday.  She attributes her nausea to the pain.  She describes sharp shooting pains in her lower abdomen.  Per chart review, she did undergo CT of chest, abdomen, pelvis 2 days ago which did not show acute findings.    Home Medications Prior to Admission medications   Medication Sig Start Date End Date Taking? Authorizing Provider  acetaminophen (TYLENOL) 325 MG tablet Take 2 tablets (650 mg total) by mouth every 4 (four) hours as needed for mild pain, headache or fever. 11/27/20  Yes Lonia Blood, MD  albuterol (PROVENTIL HFA;VENTOLIN HFA) 108 (90 BASE) MCG/ACT inhaler Inhale 2 puffs into the lungs every 4 (four) hours as needed for wheezing or shortness of breath. Reported on 06/12/2016 04/30/12  Yes Ghimire, Werner Lean, MD  apixaban (ELIQUIS) 2.5 MG TABS tablet Take 2.5 mg by mouth 2 (two)  times daily.   Yes [provider]  bisoprolol (ZEBETA) 10 MG tablet Take 1 tablet (10 mg total) by mouth daily. 11/28/20  Yes Lonia Blood, MD  bumetanide (BUMEX) 1 MG tablet Take 2 mg by mouth daily.   Yes [provider]  DULoxetine (CYMBALTA) 60 MG capsule Take 60 mg by mouth daily.   Yes [provider]  gabapentin (NEURONTIN) 300 MG capsule Take 300 mg by mouth at bedtime.   Yes [provider]  pantoprazole (PROTONIX) 20 MG tablet Take 20 mg by mouth daily.   Yes [provider]  rosuvastatin (CRESTOR) 10 MG tablet Take 10 mg by mouth daily.   Yes [provider]  senna-docusate (SENOKOT-S) 8.6-50 MG tablet Take 1 tablet by mouth at bedtime as needed for mild constipation. 11/27/20  Yes Lonia Blood, MD  cholecalciferol (VITAMIN D) 1000 units tablet Take 1,000 Units by mouth daily.     [provider]  diclofenac Sodium (VOLTAREN) 1 % GEL Apply 4 g topically 4 (four) times daily. 08/11/22   Melene Plan, DO  insulin aspart (NOVOLOG) 100 UNIT/ML injection Inject 0-5 Units into the skin at bedtime. Correction coverage: HS scale CBG < 70: implement hypoglycemia protocol CBG 70 - 120: 0 units CBG 121 - 150: 0 units CBG 151 - 200: 0 units CBG 201 - 250: 2 units CBG 251 - 300: 3 units CBG 301 - 350: 4  units CBG 351 - 400: 5 units CBG > 400 call MD. 11/28/20   Elease Etienne, MD  insulin aspart (NOVOLOG) 100 UNIT/ML injection Inject 0-20 Units into the skin 3 (three) times daily with meals. Correction coverage: Resistant (obese, steroids) CBG < 70: Implement Hypoglycemia protocol. CBG 70 - 120: 0 units CBG 121 - 150: 3 units CBG 151 - 200: 4 units CBG 201 - 250: 7 units CBG 251 - 300: 11 units CBG 301 - 350: 15 units CBG 351 - 400: 20 units CBG > 400 call MD. 11/28/20   Elease Etienne, MD  insulin glargine (LANTUS) 100 UNIT/ML injection Inject 0.54 mLs (54 Units total) into the skin daily. 11/28/20   Lonia Blood,  MD      Allergies    Codeine, Lisinopril, and Latex    Review of Systems   Review of Systems  Gastrointestinal:  Positive for abdominal pain, nausea and vomiting.  Musculoskeletal:  Positive for arthralgias.  All other systems reviewed and are negative.   Physical Exam Updated Vital Signs BP (!) 199/95 (BP Location: Right Arm)   Pulse 94   Temp (!) 97.5 F (36.4 C) (Oral)   Resp 18   SpO2 94%  Physical Exam Vitals and nursing note reviewed.  Constitutional:      General: She is not in acute distress.    Appearance: Normal appearance. She is well-developed. She is ill-appearing (Chronically). She is not toxic-appearing or diaphoretic.  HENT:     Head: Normocephalic and atraumatic.     Right Ear: External ear normal.     Left Ear: External ear normal.     Mouth/Throat:     Mouth: Mucous membranes are moist.  Eyes:     Conjunctiva/sclera: Conjunctivae normal.     Comments: Left periorbital bruising  Neck:     Comments: Bruising to left side of neck Cardiovascular:     Rate and Rhythm: Normal rate and regular rhythm.     Heart sounds: No murmur heard. Pulmonary:     Effort: Pulmonary effort is normal. No respiratory distress.     Breath sounds: Normal breath sounds. No wheezing or rales.  Chest:     Chest wall: No tenderness.  Abdominal:     General: There is no distension.     Palpations: Abdomen is soft.     Tenderness: There is no abdominal tenderness.  Musculoskeletal:     Cervical back: Normal range of motion and neck supple. Tenderness present.     Right lower leg: Edema present.     Left lower leg: Edema present.     Comments: Left hand is contracted at baseline.  Splint remains in place.  There is some bruising to distal fingers.  Skin:    General: Skin is warm and dry.     Findings: Bruising present.  Neurological:     Mental Status: She is alert and oriented to person, place, and time. Mental status is at baseline.  Psychiatric:        Mood and  Affect: Mood normal.        Behavior: Behavior normal.     ED Results / Procedures / Treatments   Labs (all labs ordered are listed, but only abnormal results are displayed) Labs Reviewed  CBC WITH DIFFERENTIAL/PLATELET - Abnormal; Notable for the following components:      Result Value   MCHC 29.6 (*)    RDW 15.7 (*)    All other components within normal limits  COMPREHENSIVE METABOLIC PANEL - Abnormal; Notable for the following components:   Glucose, Bld 133 (*)    Calcium 8.8 (*)    Total Protein 5.8 (*)    Albumin 2.6 (*)    AST 10 (*)    Total Bilirubin 1.3 (*)    All other components within normal limits  MAGNESIUM - Abnormal; Notable for the following components:   Magnesium 1.6 (*)    All other components within normal limits  LIPASE, BLOOD  URINALYSIS, ROUTINE W REFLEX MICROSCOPIC  HEMOGLOBIN A1C    EKG None  Radiology CT ABDOMEN PELVIS W CONTRAST  Result Date: 08/07/2023 CLINICAL DATA:  Acute abdominal pain EXAM: CT ABDOMEN AND PELVIS WITH CONTRAST TECHNIQUE: Multidetector CT imaging of the abdomen and pelvis was performed using the standard protocol following bolus administration of intravenous contrast. RADIATION DOSE REDUCTION: This exam was performed according to the departmental dose-optimization program which includes automated exposure control, adjustment of the mA and/or kV according to patient size and/or use of iterative reconstruction technique. CONTRAST:  75mL OMNIPAQUE IOHEXOL 350 MG/ML SOLN COMPARISON:  08/05/2023 FINDINGS: Lower chest: Lung bases again demonstrate interstitial changes similar to that seen on the prior exam. No focal confluent infiltrate is seen. Hepatobiliary: No focal liver abnormality is seen. No gallstones, gallbladder wall thickening, or biliary dilatation. Pancreas: Unremarkable. No pancreatic ductal dilatation or surrounding inflammatory changes. Spleen: Lobular spleen is again identified stable from the prior exam. Adrenals/Urinary  Tract: Adrenal glands are within normal limits. Kidneys demonstrate a normal enhancement pattern bilaterally. Punctate nonobstructing stone is noted in the lower pole of the left kidney. Left renal cyst is seen stable from the prior exam. No further follow-up is recommended. The ureters are within normal limits. The bladder is partially distended. Stomach/Bowel: No obstructive or inflammatory changes of the colon are noted. The appendix is not well visualized and likely surgically removed. Small bowel and stomach are within normal limits. Vascular/Lymphatic: Aortic atherosclerosis. No enlarged abdominal or pelvic lymph nodes. Reproductive: Uterus is again somewhat lobular stable from the prior exam likely representing fibroid. No adnexal mass is noted. Other: Fat containing umbilical hernia is noted. Musculoskeletal: No acute or significant osseous findings. Postsurgical changes in the left hip are noted. IMPRESSION: Stable nonobstructing left renal stone. No acute abnormality is noted to correspond with the given clinical history. Chronic changes as described above stable from the prior study. Electronically Signed   By: Alcide Clever M.D.   On: 08/07/2023 20:02   CT CHEST ABDOMEN PELVIS W CONTRAST  Result Date: 08/05/2023 CLINICAL DATA:  Abdominal trauma, blunt.  Unwitnessed fall. EXAM: CT CHEST, ABDOMEN, AND PELVIS WITH CONTRAST TECHNIQUE: Multidetector CT imaging of the chest, abdomen and pelvis was performed following the standard protocol during bolus administration of intravenous contrast. RADIATION DOSE REDUCTION: This exam was performed according to the departmental dose-optimization program which includes automated exposure control, adjustment of the mA and/or kV according to patient size and/or use of iterative reconstruction technique. CONTRAST:  75mL OMNIPAQUE IOHEXOL 350 MG/ML SOLN COMPARISON:  03/28/2018, 12/30/2022. FINDINGS: CT CHEST FINDINGS Cardiovascular: The heart is enlarged and there is no  pericardial effusion. Scattered coronary artery calcifications are noted. There is atherosclerotic calcification of the aorta without evidence of aneurysm. The pulmonary trunk is distended suggesting underlying pulmonary artery hypertension. Mediastinum/Nodes: Multiple enlarged mediastinal lymph nodes are noted measuring up to 1.7 cm in the subcarinal space. No hilar or axillary lymphadenopathy. The thyroid gland, trachea, and esophagus are within normal limits. Lungs/Pleura: Interstitial prominence and ground-glass  opacities are present in the lungs bilaterally, greater on the right than on the left. There is a cavitary nodule in the right upper lobe measuring 2.0 x 1.2 cm. No effusion or pneumothorax. Musculoskeletal: Sternotomy wires are noted. Degenerative changes are present in the thoracic spine. No acute fracture. CT ABDOMEN PELVIS FINDINGS Hepatobiliary: No focal liver abnormality is seen. No gallstones, gallbladder wall thickening, or biliary dilatation. Pancreas: Unremarkable. No pancreatic ductal dilatation or surrounding inflammatory changes. Spleen: Normal size.  Stable lobular contour. Adrenals/Urinary Tract: The adrenal glands are within normal limits. The kidneys enhance symmetrically. There is a cyst in the mid left kidney. Nonobstructive renal calculi are present on the left. No hydroureteronephrosis bilaterally. The bladder is unremarkable. Stomach/Bowel: Stomach is within normal limits. Appendix is not seen. No evidence of bowel wall thickening, distention, or inflammatory changes. No free air or pneumatosis. Vascular/Lymphatic: Aortic atherosclerosis. A prominent lymph node is seen in the gastrohepatic ligament and porta hepatis. Reproductive: The uterus has a lobular contour suggesting underlying fibroid. No adnexal mass. Other: No abdominopelvic ascites. A fat containing umbilical hernia is noted. Subcutaneous fat stranding is noted in the left lateral abdominal wall and in the low anterior  abdominal wall. No hematoma is seen. Musculoskeletal: Fixation hardware is present in the proximal left femur. No acute fracture is seen. IMPRESSION: 1. No evidence of acute fracture or solid organ injury. 2. Interstitial and ground-glass opacities in the lungs bilaterally, seen on prior exams and may be related to interstitial lung disease. 3. Stable cavitary lesion in the right upper lobe measuring 2.0 x 1.2 cm, which assessed by PET-CT 12/30/2022. Continued surveillance is recommended. 4. Nonobstructive left renal calculi. 5. Distended pulmonary trunk suggesting underlying pulmonary artery hypertension. 6. Uterine fibroid. 7. Aortic atherosclerosis with coronary artery calcifications. Electronically Signed   By: Thornell Sartorius M.D.   On: 08/05/2023 22:57   CT Cervical Spine Wo Contrast  Result Date: 08/05/2023 CLINICAL DATA:  Neck trauma EXAM: CT CERVICAL SPINE WITHOUT CONTRAST TECHNIQUE: Multidetector CT imaging of the cervical spine was performed without intravenous contrast. Multiplanar CT image reconstructions were also generated. RADIATION DOSE REDUCTION: This exam was performed according to the departmental dose-optimization program which includes automated exposure control, adjustment of the mA and/or kV according to patient size and/or use of iterative reconstruction technique. COMPARISON:  None Available. FINDINGS: Alignment: Normal. Skull base and vertebrae: No acute fracture. No primary bone lesion or focal pathologic process. The bones are diffusely osteopenic. Soft tissues and spinal canal: No prevertebral fluid or swelling. No visible canal hematoma. Disc levels: There is disc space narrowing and endplate osteophyte formation most significant at C5-C6 and C6-C7 compatible with degenerative change. There is no severe central canal or neural foraminal stenosis at any level. Upper chest: There some ground-glass opacities in interlobular septal thickening in the right upper lobe and minimally in the  left upper lobe. Nodular densities are seen in the left upper lobe measuring up to 3 mm. Other: There are enlarged prevascular lymph nodes measuring up to 14 mm. There is soft tissue air and stranding in the lateral left neck, likely posttraumatic. IMPRESSION: 1. No acute fracture or traumatic subluxation of the cervical spine. 2. Soft tissue air and stranding in the lateral left neck, likely posttraumatic. 3. Ground-glass opacities and interlobular septal thickening in the upper lobes, right greater than left, may be infectious/inflammatory. 4. Left upper lobe pulmonary nodules measuring up to 3 mm. 5. Prevascular mediastinal lymphadenopathy of uncertain etiology. Electronically Signed   By: Linton Rump  Malena Edman M.D.   On: 08/05/2023 22:40   CT Head Wo Contrast  Result Date: 08/05/2023 CLINICAL DATA:  Head trauma EXAM: CT HEAD WITHOUT CONTRAST TECHNIQUE: Contiguous axial images were obtained from the base of the skull through the vertex without intravenous contrast. RADIATION DOSE REDUCTION: This exam was performed according to the departmental dose-optimization program which includes automated exposure control, adjustment of the mA and/or kV according to patient size and/or use of iterative reconstruction technique. COMPARISON:  Head CT 11/14/2020.  MRI brain 02/07/2016 FINDINGS: Brain: No evidence of acute infarction, hemorrhage, hydrocephalus, extra-axial collection or mass lesion/mass effect. Small old cortical infarcts are seen in the right parietal lobe corresponding to MRI findings. There is mild periventricular white matter hypodensity, likely chronic small vessel ischemic change. There is an old lacunar infarct in the right basal ganglia. Vascular: Atherosclerotic calcifications are present within the cavernous internal carotid arteries. Skull: Normal. Negative for fracture or focal lesion. Sinuses/Orbits: No acute finding. Other: There is soft tissue swelling overlying the right parietal scalp and lateral to  the left orbit. IMPRESSION: 1. No acute intracranial process. 2. Soft tissue swelling overlying the right parietal scalp and lateral to the left orbit. Electronically Signed   By: Darliss Cheney M.D.   On: 08/05/2023 22:36   DG Hand Complete Left  Result Date: 08/05/2023 CLINICAL DATA:  Unwitnessed fall EXAM: LEFT HAND - COMPLETE 3+ VIEW COMPARISON:  None Available. FINDINGS: Limited by positioning with flexion of the digits. Osseous demineralization. Acute mildly displaced fractures involving the necks of the first second and third metacarpals with mild volar angulation of second metacarpal head. IMPRESSION: Acute mildly displaced fractures involving the necks of the first second and third metacarpals. Osseous demineralization and limited positioning. Electronically Signed   By: Jasmine Pang M.D.   On: 08/05/2023 22:13   DG Forearm Left  Result Date: 08/05/2023 CLINICAL DATA:  Fall with hematoma to head EXAM: LEFT FOREARM - 2 VIEW COMPARISON:  None Available. FINDINGS: No acute fracture or malalignment. Mild heterogeneous lucency at the distal shaft and metaphysis of radius. IMPRESSION: 1. No acute osseous abnormality. 2. Mild heterogeneous lucency at the distal radius, this is non specific, but correlate for any history of marrow disease. Electronically Signed   By: Jasmine Pang M.D.   On: 08/05/2023 22:11   DG Humerus Left  Result Date: 08/05/2023 CLINICAL DATA:  One witnessed fall EXAM: LEFT HUMERUS - 2+ VIEW COMPARISON:  None Available. FINDINGS: No acute displaced fracture or malalignment. Soft tissues are unremarkable IMPRESSION: No acute osseous abnormality Electronically Signed   By: Jasmine Pang M.D.   On: 08/05/2023 22:08   DG Pelvis Portable  Result Date: 08/05/2023 CLINICAL DATA:  Fall EXAM: PORTABLE PELVIS 1-2 VIEWS COMPARISON:  Abdominal x-ray 04/23/2017 FINDINGS: Left-sided hip screw and intramedullary nail are present. There is no evidence for hardware complication, acute fracture  or dislocation. There is heterotopic ossification lateral to the left hip. There are mild degenerative changes of both hips. IMPRESSION: 1. No acute fracture or dislocation. 2. Left hip screw and intramedullary nail without evidence for hardware complication. Electronically Signed   By: Darliss Cheney M.D.   On: 08/05/2023 22:08   DG Humerus Right  Result Date: 08/05/2023 CLINICAL DATA:  Fall and trauma to the right upper extremity. EXAM: RIGHT HUMERUS - 2+ VIEW; RIGHT FOREARM - 2 VIEW COMPARISON:  None Available. FINDINGS: Evaluation of the elbow is limited as a true 90 degree lateral view is not provided as well as due  to overlying blood pressure cough. No definite acute fracture or dislocation. Apparent faint linear lucency in the lateral epicondyle, likely artifactual. The bones are well mineralized. No significant arthritic changes. The soft tissues are unremarkable. IMPRESSION: No acute fracture or dislocation. Electronically Signed   By: Elgie Collard M.D.   On: 08/05/2023 22:08   DG Forearm Right  Result Date: 08/05/2023 CLINICAL DATA:  Fall and trauma to the right upper extremity. EXAM: RIGHT HUMERUS - 2+ VIEW; RIGHT FOREARM - 2 VIEW COMPARISON:  None Available. FINDINGS: Evaluation of the elbow is limited as a true 90 degree lateral view is not provided as well as due to overlying blood pressure cough. No definite acute fracture or dislocation. Apparent faint linear lucency in the lateral epicondyle, likely artifactual. The bones are well mineralized. No significant arthritic changes. The soft tissues are unremarkable. IMPRESSION: No acute fracture or dislocation. Electronically Signed   By: Elgie Collard M.D.   On: 08/05/2023 22:08   DG Chest Portable 1 View  Result Date: 08/05/2023 CLINICAL DATA:  Fall. EXAM: PORTABLE CHEST 1 VIEW COMPARISON:  Chest radiograph dated 04/08/2023 FINDINGS: There is diffuse chronic antral coarsening. No new consolidation. No pleural effusion pneumothorax.  Stable cardiomegaly. Median sternotomy wires. Osteopenia with degenerative changes of the spine. No acute osseous pathology. IMPRESSION: 1. No acute cardiopulmonary process. 2. Stable cardiomegaly. Electronically Signed   By: Elgie Collard M.D.   On: 08/05/2023 22:05    Procedures Procedures    Medications Ordered in ED Medications  apixaban (ELIQUIS) tablet 2.5 mg (has no administration in time range)  bisoprolol (ZEBETA) tablet 10 mg (has no administration in time range)  bumetanide (BUMEX) tablet 1 mg (has no administration in time range)  lisdexamfetamine (VYVANSE) capsule 30 mg (has no administration in time range)  senna-docusate (Senokot-S) tablet 1 tablet (has no administration in time range)  oxyCODONE-acetaminophen (PERCOCET/ROXICET) 5-325 MG per tablet 1 tablet (has no administration in time range)  insulin aspart (novoLOG) injection 0-20 Units (has no administration in time range)  insulin aspart (novoLOG) injection 0-5 Units (has no administration in time range)  insulin detemir (LEVEMIR) injection 20 Units (has no administration in time range)  iohexol (OMNIPAQUE) 350 MG/ML injection 75 mL (has no administration in time range)  albuterol (PROVENTIL) (2.5 MG/3ML) 0.083% nebulizer solution 2.5 mg (has no administration in time range)  DULoxetine (CYMBALTA) DR capsule 60 mg (has no administration in time range)  gabapentin (NEURONTIN) capsule 300 mg (has no administration in time range)  pantoprazole (PROTONIX) EC tablet 20 mg (has no administration in time range)  rosuvastatin (CRESTOR) tablet 10 mg (has no administration in time range)  oxyCODONE-acetaminophen (PERCOCET/ROXICET) 5-325 MG per tablet 1 tablet (1 tablet Oral Given 08/07/23 1604)  ondansetron (ZOFRAN-ODT) disintegrating tablet 4 mg (4 mg Oral Given 08/07/23 1605)  lactated ringers bolus 500 mL (0 mLs Intravenous Stopped 08/07/23 1701)  magnesium sulfate IVPB 2 g 50 mL (2 g Intravenous New Bag/Given 08/07/23 1818)   iohexol (OMNIPAQUE) 350 MG/ML injection 75 mL (75 mLs Intravenous Contrast Given 08/07/23 1853)    ED Course/ Medical Decision Making/ A&P                                 Medical Decision Making Amount and/or Complexity of Data Reviewed Labs: ordered. Radiology: ordered.  Risk OTC drugs. Prescription drug management.   This patient presents to the ED for concern of abdominal pain, this involves  an extensive number of treatment options, and is a complaint that carries with it a high risk of complications and morbidity.  The differential diagnosis includes traumatic injury, colitis, constipation, UTI, SBO   Co morbidities that complicate the patient evaluation  COPD, CHF, obesity, HTN, DM, HLD, cirrhosis, CVA, depression, anxiety, arthritis   Additional history obtained:  Additional history obtained from N/A External records from outside source obtained and reviewed including EMR   Lab Tests:  I Ordered, and personally interpreted labs.  The pertinent results include: Normal hemoglobin, no leukocytosis, normal kidney function, hypomagnesemia with otherwise normal electrolytes.  Lipase and hepatobiliary enzymes normal.   Imaging Studies ordered:  I ordered imaging studies including CT of abdomen and pelvis I independently visualized and interpreted imaging which showed no acute findings I agree with the radiologist interpretation   Cardiac Monitoring: / EKG:  The patient was maintained on a cardiac monitor.  I personally viewed and interpreted the cardiac monitored which showed an underlying rhythm of: Sinus rhythm   Consultations Obtained:  I requested consultation with the TOC,  and discussed lab and imaging findings as well as pertinent plan - they recommend: (Pending)   Problem List / ED Course / Critical interventions / Medication management  Patient presents for pain in left hand and lower abdomen.  Additionally, she reports that she has not been able to  tolerate p.o. intake in the past 2 days.  She attributes her nausea and poor appetite to uncontrolled pain.  She did have a mechanical fall out of her wheelchair 2 days ago and was seen in the ED at the time.  She underwent pan scans as well as extremity x-rays.  Acute injuries were limited to left hand metacarpal fractures.  She has been in a splint.  She has been taking Tylenol only at home for pain.  Vital signs on arrival are notable for hypertension.  Although she reports lower abdominal pain, no tenderness is present on exam.  Per chart review, CT of chest, abdomen, and pelvis did not show any intra-abdominal injuries 2 days ago.  Patient was given Percocet for analgesia.  IV fluids were ordered.  Lab work was initiated.  Lab work was notable for hypomagnesemia.  Replacement magnesium was ordered.  On reassessment, patient had improved pain.  She was given water to drink which she was able to do.  She was given crackers to eat.  After further conversation, patient states that she is unable to care for herself at home.  Currently, she does not have any home health support.  Prior to her fall, she had poor mobility and would have to get around in a wheelchair.  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  Recent fall has worsened her mobility.  Given these concerns, patient to undergo PT evaluation and social work consult for possible rehab facility placement.  She will remain in the ED overnight.  Home medications were ordered.  Urinalysis showed evidence of infection.  This may explain her lower abdominal pain.  Dose of ceftriaxone was ordered.  Patient to start on Keflex tomorrow night. I ordered medication including Percocet for analgesia; Zofran for nausea; IV fluids for hydration; magnesium sulfate for hypomagnesemia Reevaluation of the patient after these medicines showed that the patient improved I have reviewed the patients home medicines and have made adjustments as needed   Social  Determinants of Health:  Lives alone, wheelchair dependent at baseline        Final Clinical Impression(s) /  ED Diagnoses Final diagnoses:  Left hand pain  Lower abdominal pain  Very poor mobility    Rx / DC Orders ED Discharge Orders     None         Gloris Manchester, MD 08/07/23 2011    Gloris Manchester, MD 08/07/23 2123

## 2023-08-07 NOTE — ED Triage Notes (Signed)
Pt bib ems from home c/o pain mgmt. Pt was recently seen at [facility] 9/12 for a fall and broken fingers on left hand. Pt says she has been in so much pain since the incident. Pt has bruising under left eye and right forearm. Pt has a splint on the left arm. Pt states she has neuropathy and blisters bilaterally on lower extremities that are bandage.    BP 142/82 HR 68 RA 100% RR 18 CBG 155  Pt uses 2L nasal canula for exertion.

## 2023-08-08 ENCOUNTER — Other Ambulatory Visit: Payer: Self-pay

## 2023-08-08 ENCOUNTER — Encounter (HOSPITAL_COMMUNITY): Payer: Self-pay

## 2023-08-08 NOTE — [place] FL2 (Signed)
[place] MEDICAID FL2 LEVEL OF CARE FORM     IDENTIFICATION  Patient Name: Margaret [name] Birthdate: 02-Dec-1955 Sex: female Admission Date (Current Location): 08/07/2023  [facility] and [place] Number:  Producer, television/film/video and Address:  [facility], [address], [place], [place] [address]      Provider Number: [id number]  Attending Physician Name and Address:  System, Provider Not In  Relative Name and Phone Number:       Current Level of Care: Hospital Recommended Level of Care: Skilled Nursing Facility Prior Approval Number:    Date Approved/Denied:   PASRR Number: [id number]  Discharge Plan: SNF    Current Diagnoses: Patient Active Problem List   Diagnosis Date Noted   Acute renal failure superimposed on stage 3a chronic kidney disease (HCC) 11/18/2020   Closed left femoral fracture (HCC) 11/14/2020   Hyperglycemia due to diabetes mellitus (HCC) 11/14/2020   Fall at home, initial encounter 11/14/2020   Left shoulder pain 06/29/2016   Encounter for therapeutic drug monitoring 06/17/2016   Spastic hemiplegia affecting left nondominant side (HCC) 06/12/2016   Generalized anxiety disorder 06/12/2016   Muscle spasticity    Adjustment disorder with anxious mood    Subtherapeutic anticoagulation    PAF (paroxysmal atrial fibrillation) (HCC)    Hemiplegia of nondominant side, late effect of cerebrovascular disease (HCC)    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    Hemiparesis and dysphasia as late effect of cerebrovascular accident (CVA) (HCC)    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)    Interstitial edema    Type 2 diabetes mellitus with peripheral neuropathy (HCC)    Debilitated 05/12/2016   Left hemiparesis (HCC) 05/12/2016   History of subarachnoid hemorrhage    SOB (shortness of breath)    History of CVA with residual deficit    Dysarthria, post-stroke    Chronic obstructive pulmonary disease  (HCC)    Tachypnea    Bradycardia    Hypokalemia    Leukocytosis    SIRS (systemic inflammatory response syndrome) (HCC)    Acute blood loss anemia    Thrombocytosis    S/P mitral valve repair 04/30/2016   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/16/2016   Panic disorder without agoraphobia with severe panic attacks    Acute on chronic diastolic congestive heart failure due to valvular disease (HCC) 04/14/2016   CHF due to valvular disease, acute on chronic, diastolic 04/14/2016   Chronic diastolic congestive heart failure due to valvular disease (HCC) 04/13/2016   History of stroke 04/13/2016   Cerebrovascular accident (CVA) due to occlusion of right carotid artery (HCC) 03/25/2016   Splenic infarct 03/25/2016   Type 2 diabetes mellitus with circulatory disorder (HCC) 03/25/2016   Mitral regurgitation 03/06/2016   HLD (hyperlipidemia) 03/06/2016   Right carotid artery occlusion    Epigastric abdominal tenderness on direct palpation    Endocarditis of mitral valve    Positive ANA (antinuclear antibody)    Streptococcus viridans infection 02/04/2016   Cerebrovascular accident (CVA) due to embolism of cerebral artery (HCC)    Subarachnoid hemorrhage (HCC) 02/01/2016   Stroke (HCC)    Visual changes    Pulmonary infiltrates 01/23/2016   Pulmonary hypertension assoc with unclear multi-factorial mechanisms (HCC) 01/23/2016   Splenic infarction 01/14/2016   Insulin dependent diabetes mellitus 01/14/2016   Essential hypertension 01/14/2016   Hepatic cirrhosis (HCC) 01/14/2016   COPD (chronic obstructive pulmonary disease) (HCC) 04/27/2012   Chronic diastolic  CHF (congestive heart failure) (HCC) 04/27/2012   Morbid obesity (HCC) 04/27/2012   Hypoxia 04/27/2012   Dyspnea on exertion 04/27/2012   Smoker 04/27/2012    Orientation RESPIRATION BLADDER Height & Weight     Self, Time, Situation, Place  O2 (3L [place]) Continent Weight:   Height:     BEHAVIORAL SYMPTOMS/MOOD  NEUROLOGICAL BOWEL NUTRITION STATUS      Continent Diet (See discharge summary)  AMBULATORY STATUS COMMUNICATION OF NEEDS Skin   Extensive Assist Verbally Normal                       Personal Care Assistance Level of Assistance  Bathing, Feeding, Dressing Bathing Assistance: Maximum assistance Feeding assistance: Limited assistance Dressing Assistance: Maximum assistance     Functional Limitations Info  Sight, Hearing, Speech Sight Info: Adequate Hearing Info: Adequate Speech Info: Adequate    SPECIAL CARE FACTORS FREQUENCY  PT (By licensed PT), OT (By licensed OT)     PT Frequency: 5x weekly OT Frequency: 5x weekly            Contractures Contractures Info: Not present    Additional Factors Info  Code Status, Allergies, Psychotropic Code Status Info: Full Code Allergies Info: Codeine, Lisinopril, Latex Psychotropic Info: Cymbalta, Vyvanse         Current Medications (08/08/2023):  This is the current hospital active medication list Current Facility-Administered Medications  Medication Dose Route Frequency Provider Last Rate Last Admin   albuterol (PROVENTIL) (2.5 MG/3ML) 0.083% nebulizer solution 2.5 mg  2.5 mg Nebulization Q4H PRN Gloris Manchester, MD       apixaban Everlene Balls) tablet 2.5 mg  2.5 mg Oral BID Gloris Manchester, MD   2.5 mg at 08/08/23 0951   bisoprolol (ZEBETA) tablet 10 mg  10 mg Oral Daily Gloris Manchester, MD   10 mg at 08/08/23 0951   bumetanide (BUMEX) tablet 1 mg  1 mg Oral Daily Gloris Manchester, MD   1 mg at 08/08/23 0951   cephALEXin (KEFLEX) capsule 500 mg  500 mg Oral Q6H Gloris Manchester, MD       DULoxetine (CYMBALTA) DR capsule 60 mg  60 mg Oral Daily Gloris Manchester, MD   60 mg at 08/08/23 0949   gabapentin (NEURONTIN) capsule 300 mg  300 mg Oral QHS Gloris Manchester, MD   300 mg at 08/07/23 2145   insulin aspart (novoLOG) injection 0-20 Units  0-20 Units Subcutaneous TID WC Gloris Manchester, MD   3 Units at 08/08/23 1142   insulin aspart (novoLOG) injection 0-5 Units   0-5 Units Subcutaneous QHS Gloris Manchester, MD       insulin detemir (LEVEMIR) injection 20 Units  20 Units Subcutaneous QHS Gloris Manchester, MD       iohexol (OMNIPAQUE) 350 MG/ML injection 75 mL  75 mL Intravenous Once PRN Gloris Manchester, MD       lisdexamfetamine (VYVANSE) capsule 30 mg  30 mg Oral Daily Gloris Manchester, MD   30 mg at 08/08/23 1017   oxyCODONE-acetaminophen (PERCOCET/ROXICET) 5-325 MG per tablet 1 tablet  1 tablet Oral Q4H PRN Gloris Manchester, MD   1 tablet at 08/08/23 0441   pantoprazole (PROTONIX) EC tablet 20 mg  20 mg Oral Daily Gloris Manchester, MD   20 mg at 08/08/23 0949   rosuvastatin (CRESTOR) tablet 10 mg  10 mg Oral Daily Gloris Manchester, MD   10 mg at 08/08/23 0950   senna-docusate (Senokot-S) tablet 1 tablet  1 tablet Oral QHS PRN Dixon,  Alycia Rossetti, MD       Current Outpatient Medications  Medication Sig Dispense Refill   acetaminophen (TYLENOL) 325 MG tablet Take 2 tablets (650 mg total) by mouth every 4 (four) hours as needed for mild pain, headache or fever.     albuterol (PROVENTIL HFA;VENTOLIN HFA) 108 (90 BASE) MCG/ACT inhaler Inhale 2 puffs into the lungs every 4 (four) hours as needed for wheezing or shortness of breath. Reported on 06/12/2016     apixaban (ELIQUIS) 2.5 MG TABS tablet Take 2.5 mg by mouth 2 (two) times daily.     bisoprolol (ZEBETA) 10 MG tablet Take 1 tablet (10 mg total) by mouth daily.     bumetanide (BUMEX) 1 MG tablet Take 2 mg by mouth daily.     DULoxetine (CYMBALTA) 60 MG capsule Take 60 mg by mouth daily.     gabapentin (NEURONTIN) 300 MG capsule Take 300 mg by mouth at bedtime.     pantoprazole (PROTONIX) 20 MG tablet Take 20 mg by mouth daily.     rosuvastatin (CRESTOR) 10 MG tablet Take 10 mg by mouth daily.     senna-docusate (SENOKOT-S) 8.6-50 MG tablet Take 1 tablet by mouth at bedtime as needed for mild constipation.     cholecalciferol (VITAMIN D) 1000 units tablet Take 1,000 Units by mouth daily.      diclofenac Sodium (VOLTAREN) 1 % GEL Apply 4 g  topically 4 (four) times daily. 100 g 0   insulin aspart (NOVOLOG) 100 UNIT/ML injection Inject 0-5 Units into the skin at bedtime. Correction coverage: HS scale CBG < 70: implement hypoglycemia protocol CBG 70 - 120: 0 units CBG 121 - 150: 0 units CBG 151 - 200: 0 units CBG 201 - 250: 2 units CBG 251 - 300: 3 units CBG 301 - 350: 4 units CBG 351 - 400: 5 units CBG > 400 call MD.     insulin aspart (NOVOLOG) 100 UNIT/ML injection Inject 0-20 Units into the skin 3 (three) times daily with meals. Correction coverage: Resistant (obese, steroids) CBG < 70: Implement Hypoglycemia protocol. CBG 70 - 120: 0 units CBG 121 - 150: 3 units CBG 151 - 200: 4 units CBG 201 - 250: 7 units CBG 251 - 300: 11 units CBG 301 - 350: 15 units CBG 351 - 400: 20 units CBG > 400 call MD.     insulin glargine (LANTUS) 100 UNIT/ML injection Inject 0.54 mLs (54 Units total) into the skin daily. 10 mL 11     Discharge Medications: Please see discharge summary for a list of discharge medications.  Relevant Imaging Results:  Relevant Lab Results:   Additional Information SS#: [id number]  Inis Sizer, LCSW

## 2023-08-08 NOTE — ED Notes (Signed)
Pt doing well. No complaints. Alert/oriented.

## 2023-08-08 NOTE — Evaluation (Signed)
Physical Therapy Evaluation Patient Details Name: Margaret [name] MRN: [id number] DOB: December 15, 1955 Today's Date: 08/08/2023  History of Present Illness  Pt is a 67 y.o. female who presented 08/05/23 s/p fall during transfer to unlocked w/c. Imaging revealed fxs of the L 1-3 metacarpals. PMH significant for panic attacks, chronic Diastolic CHF, s/p Mitral Valve repair with Annoloplasty on Eliquis, Endocarditis, COPD, Pulmonary HTN, Obesity, SAH, DM2, Splenic infarction, prior CVA with residual Lt sided weakness, asthma, anemia   Clinical Impression  Pt presents with condition above and deficits mentioned below, see PT Problem List. PTA, she was living in a mobile home with a ramped entrance. She does report some L sided residual weakness from a prior CVA but was able to utilize the L UE to push up with transfers. At baseline, she is mod I using a hemiwalker vs rollator vs QC to ambulate ~3-5 ft at a time before needing to rest and primarily using her w/c for further mobility. She typically goes to [facility] on Tuesdays and Thursdays. She has had increased difficulty with cleaning, cooking, and ADLs this past week since her legs began to swell. Her husband would typically assist her as needed but he recently went to a hospice facility and she is now home alone. She reports she was working with a CM in regards to going to an ALF. At this time, pt demonstrates deficits in generalized strength, balance, and activity tolerance and is further limited in mobility independence and safety now that she is unable to utilize her L hand. She is at high risk for subsequent falls and would be unsafe to d/c home alone. She is requiring modA for bed mobility and CGA-minA for transfers at this time. Recommending short-term inpatient rehab, < 3 hours/day, while pt arranges a long-term solution to go to an ALF. Will continue to follow acutely.      If plan is discharge home, recommend the following: A little help with  walking and/or transfers;A lot of help with bathing/dressing/bathroom;Assistance with cooking/housework;Assist for transportation;Help with stairs or ramp for entrance   Can travel by private vehicle   Yes    Equipment Recommendations None recommended by PT  Recommendations for Other Services  OT consult    Functional Status Assessment Patient has had a recent decline in their functional status and demonstrates the ability to make significant improvements in function in a reasonable and predictable amount of time.     Precautions / Restrictions Precautions Precautions: Fall Restrictions Weight Bearing Restrictions: Yes LUE Weight Bearing: Weight bear through elbow only (confirmed with Dr. Durwin Nora 9/15)      Mobility  Bed Mobility Overal bed mobility: Needs Assistance Bed Mobility: Supine to Sit, Sit to Supine     Supine to sit: Mod assist, HOB elevated Sit to supine: Mod assist   General bed mobility comments: ModA to pivot hips towards R EOB, extra time and pt resting a couple times as she lifted her trunk. ModA to lift legs and pivot pt back to supine with pt managing her trunk primarily.    Transfers Overall transfer level: Needs assistance Equipment used: None Transfers: Sit to/from Stand, Bed to chair/wheelchair/BSC Sit to Stand: Contact guard assist   Step pivot transfers: Min assist, Contact guard assist       General transfer comment: Pt performing transfer to stand and step pivot to R from stretcher to w/c and then to L from w/c to stretcher using her R hand only for stability. CGA to transfer to R.  MinA for balance and safety transferring to the L.    Ambulation/Gait Ambulation/Gait assistance: Min assist, Contact guard assist Gait Distance (Feet): 2 Feet Assistive device: None Gait Pattern/deviations: Step-through pattern, Decreased step length - right, Decreased step length - left, Decreased stride length, Shuffle Gait velocity: reduced Gait velocity  interpretation: <1.31 ft/sec, indicative of household ambulator   General Gait Details: Pt takes slow, small, shuffling steps between bed and w/c with R UE support intermittently on furniture. CGA-miNA for balance  Stairs            Wheelchair Mobility     Tilt Bed    Modified Rankin (Stroke Patients Only)       Balance Overall balance assessment: Needs assistance Sitting-balance support: No upper extremity supported, Feet supported Sitting balance-Leahy Scale: Good     Standing balance support: Single extremity supported, No upper extremity supported, During functional activity Standing balance-Leahy Scale: Fair Standing balance comment: Able to stand statically without UE support, but needs R UE support to take steps; poor endurance                             Pertinent Vitals/Pain Pain Assessment Pain Assessment: Faces Faces Pain Scale: Hurts little more Pain Location: bil legs; L hand Pain Descriptors / Indicators: Discomfort, Grimacing, Guarding Pain Intervention(s): Monitored during session, Limited activity within patient's tolerance, Repositioned    Home Living Family/patient expects to be discharged to:: Private residence Living Arrangements: Alone (husband is at hospice facility at this time) Available Help at Discharge: Neighbor;Family;Other (Comment) (come by 2-3 days/week to bring by dinner and assist with yard work; was going to [employer] hours on Tuesdays and Thursdays) Type of Home: Mobile home Home Access: Ramped entrance       Home Layout: One level Home Equipment: Rollator (4 wheels);Cane - quad;BSC/3in1;Wheelchair - manual;Shower seat;Grab bars - tub/shower;Hospital bed;Other (comment) (hemi-walker) Additional Comments: was going to [facility] 8 hours/day on Tuesday and Thursday; husband recently went to hospice facility; pt alone at home now, which is new to her and she is having increased difficulty managing things, was working with  CM in regards to going to an ALF    Prior Function Prior Level of Function : Driving;Independent/Modified Independent             Mobility Comments: Mod I; Uses hemiwalker vs rollator vs QC to ambulate ~3-5 ft at a time before needing to rest; Primarily uses w/c ADLs Comments: Increased difficulty with cleaning, cooking, and ADLs this past week since her legs began to swell; technically can drive but does not like to; gets groceries delivered; was going to [facility] 8 hours/day on Tuesday and Thursday; husband recently went to hospice facility; pt alone at home now, which is new to her and she is having increased difficulty managing things, was working with CM in regards to going to an ALF     Extremity/Trunk Assessment   Upper Extremity Assessment Upper Extremity Assessment: Defer to OT evaluation    Lower Extremity Assessment Lower Extremity Assessment: RLE deficits/detail;LLE deficits/detail RLE Deficits / Details: generalized weakness; ACE wrap lower legs as pt reports blisters; edema noted LLE Deficits / Details: generalized weakness; ACE wrap lower legs as pt reports blisters; edema noted    Cervical / Trunk Assessment Cervical / Trunk Assessment: Other exceptions Cervical / Trunk Exceptions: increased body habitus  Communication   Communication Communication: No apparent difficulties  Cognition Arousal: Alert Behavior During Therapy: [facility]  for tasks assessed/performed Overall Cognitive Status: Within Functional Limits for tasks assessed                                 General Comments: Tangential speech at times, needing occasional redirection, seems likely at baseline        General Comments General comments (skin integrity, edema, etc.): pt agreeable to SNF until can find ALF as she agrees she is unsafe home alone    Exercises     Assessment/Plan    PT Assessment Patient needs continued PT services  PT Problem List Decreased strength;Decreased  activity tolerance;Decreased balance;Decreased mobility;Obesity;Pain       PT Treatment Interventions DME instruction;Gait training;Functional mobility training;Therapeutic exercise;Therapeutic activities;Balance training;Neuromuscular re-education;Patient/family education;Wheelchair mobility training    PT Goals (Current goals can be found in the Care Plan section)  Acute Rehab PT Goals Patient Stated Goal: to go to ALF PT Goal Formulation: With patient Time For Goal Achievement: 08/22/23 Potential to Achieve Goals: Good    Frequency Min 1X/week     Co-evaluation               AM-PAC PT "6 Clicks" Mobility  Outcome Measure Help needed turning from your back to your side while in a flat bed without using bedrails?: A Little Help needed moving from lying on your back to sitting on the side of a flat bed without using bedrails?: A Lot Help needed moving to and from a bed to a chair (including a wheelchair)?: A Little Help needed standing up from a chair using your arms (e.g., wheelchair or bedside chair)?: A Little Help needed to walk in hospital room?: Total Help needed climbing 3-5 steps with a railing? : Total 6 Click Score: 13    End of Session Equipment Utilized During Treatment: Gait belt Activity Tolerance: Patient tolerated treatment well Patient left: in bed;with bed alarm set;with call bell/phone within reach Nurse Communication: Mobility status PT Visit Diagnosis: Unsteadiness on feet (R26.81);Other abnormalities of gait and mobility (R26.89);Muscle weakness (generalized) (M62.81);History of falling (Z91.81);Difficulty in walking, not elsewhere classified (R26.2);Pain Pain - Right/Left: Left Pain - part of body: Hand    Time: 8469-6295 PT Time Calculation (min) (ACUTE ONLY): 41 min   Charges:   PT Evaluation $PT Eval Moderate Complexity: 1 Mod PT Treatments $Therapeutic Activity: 23-37 mins PT General Charges $$ ACUTE PT VISIT: 1 Visit          Virgil Benedict, PT, DPT Acute Rehabilitation Services  Office: [phone]   Bettina Gavia 08/08/2023, 11:34 AM

## 2023-08-08 NOTE — ED Provider Notes (Signed)
Emergency Medicine Observation Re-evaluation Note  Margaret [name] is a 67 y.o. female, seen on rounds today.  Pt initially presented to the ED for complaints of Hand Pain Currently, the patient is sitting in bed.  Physical Exam  BP (!) 115/51   Pulse (!) 53   Temp 98.3 F (36.8 C)   Resp 19   SpO2 95%  Physical Exam General: Awake, alert, nondistressed Cardiac: Extremities well-perfused Lungs: Breathing is unlabored, on baseline supplemental oxygen Psych: No agitation  ED Course / MDM  EKG:   I have reviewed the labs performed to date as well as medications administered while in observation.  Recent changes in the last 24 hours include presentation to the emergency department yesterday following uncontrolled pain at home following a fall 2 days ago that has also caused decreased mobility.  Patient to undergo PT evaluation and TOC consult today.  Plan  Current plan is for PT evaluation and social work consult for possible placement.    Gloris Manchester, MD 08/08/23 1124

## 2023-08-08 NOTE — Progress Notes (Signed)
CSW completed FL2 and faxed patient's clinicals out to facilities that are in network with [facility].  Edwin Dada, MSW, LCSW Transitions of Care  Clinical Social Worker II [phone]

## 2023-08-08 NOTE — ED Notes (Signed)
BG 139

## 2023-08-09 LAB — CBG MONITORING, ED
Glucose-Capillary: 161 mg/dL — ABNORMAL HIGH (ref 70–99)
Glucose-Capillary: 139 mg/dL — ABNORMAL HIGH (ref 70–99)
Glucose-Capillary: 121 mg/dL — ABNORMAL HIGH (ref 70–99)
Glucose-Capillary: 131 mg/dL — ABNORMAL HIGH (ref 70–99)
Glucose-Capillary: 128 mg/dL — ABNORMAL HIGH (ref 70–99)

## 2023-08-09 NOTE — ED Provider Notes (Addendum)
Emergency Medicine Observation Re-evaluation Note  ZYMERIA MARESH is a 67 y.o. female, seen on rounds today.  Pt initially presented to the ED for complaints of Hand Pain Currently, the patient is sitting in bed.  Physical Exam  BP (!) 132/51   Pulse (!) 45   Temp 98.2 F (36.8 C)   Resp 13   SpO2 100%  Physical Exam General: NAD Cardiac: Extremities well-perfused Lungs: Even and unlabored on baseline supplemental oxygen Psych: No agitation  ED Course / MDM  EKG:   I have reviewed the labs performed to date as well as medications administered while in observation.  Recent changes in the last 24 hours include none.  Plan  Current plan is for PT evaluation and social work consult for possible placement.      Per SW, pt being discharged to "[facility] in [place]. Marcelino Duster number for report is [phone]."    Ernie Avena, MD 08/09/23 [phone]

## 2023-08-09 NOTE — ED Notes (Signed)
Patient's bed was wet. Changed into a new chux and clean linen. Patient is comfortable at this time.

## 2023-08-09 NOTE — Progress Notes (Signed)
CSW spoke with patient at bedside to notify her of [facility] picking her up and taking her to [place]. Patient is agreeable to this plan.

## 2023-08-09 NOTE — Progress Notes (Signed)
CSW spoke with Jomarie Longs in admissions and patient has been accepted at [facility] in [place]. CSW also spoke with patients social worker [place] at [facility] [place] [phone]). [place] will prepare transport and give CSW a call back. Patient is under the care of [facility] in [facility].

## 2023-08-09 NOTE — Evaluation (Signed)
Occupational Therapy Evaluation Patient Details Name: Margaret [name] MRN: [id number] DOB: 1956/09/23 Today's Date: 08/09/2023   History of Present Illness Pt is a 67 y.o. female who presented 08/05/23 s/p fall during transfer to unlocked w/c. Imaging revealed fxs of the L 1-3 metacarpals. PMH significant for panic attacks, chronic Diastolic CHF, s/p Mitral Valve repair with Annoloplasty on Eliquis, Endocarditis, COPD, Pulmonary HTN, Obesity, SAH, DM2, Splenic infarction, prior CVA with residual Lt sided weakness, asthma, anemia   Clinical Impression   Pt currently at max assist level for simulated selfcare tasks and functional transfers without use of an assistive device.  Pt with LUE in volar splint which was re-fabricated by ortho techs during session secondary to pt having increased tone in the digits from previous CVA.  Sling also provided for support.  Oxygen sats at 87% on room air during session but increase up to 95% or better on 4Ls nasal cannula.  She was living alone prior to admission since her spouse has moved to hospice and was needing more assist, as he was her main caregiver.  Feel she will benefit from acute care OT at this time to help increase progression with basic ADLs.  Pt will need 24 hr assist post discharge now that she is unable to integrate the LUE that she used as a stabilizer prior the fall.  Recommend patient will benefit from continued inpatient follow up therapy, <3 hours/day post acute stay to continue progression.          If plan is discharge home, recommend the following: A lot of help with walking and/or transfers;A lot of help with bathing/dressing/bathroom;Assistance with feeding;Assistance with cooking/housework;Direct supervision/assist for financial management;Help with stairs or ramp for entrance;Direct supervision/assist for medications management    Functional Status Assessment  Patient has had a recent decline in their functional status and  demonstrates the ability to make significant improvements in function in a reasonable and predictable amount of time.  Equipment Recommendations  Other (comment) (TBD next venue of care)       Precautions / Restrictions Precautions Precautions: Fall Restrictions Weight Bearing Restrictions: Yes LUE Weight Bearing: Non weight bearing      Mobility Bed Mobility Overal bed mobility: Needs Assistance Bed Mobility: Supine to Sit, Sit to Supine     Supine to sit: Max assist, HOB elevated Sit to supine: Max assist   General bed mobility comments: Assist for all aspects of supine to sit and sit to supine including bringing trunk up and assisting with bringing LEs to the side and rotating her hips around.    Transfers Overall transfer level: Needs assistance Equipment used: None Transfers: Sit to/from Stand, Bed to chair/wheelchair/BSC Sit to Stand: Min assist     Step pivot transfers: Max assist     General transfer comment: Pt able to complete sit to stand off of higher stretcher at min assist level, but demonstrated decreased ability to take steps and weightshift.      Balance Overall balance assessment: Needs assistance Sitting-balance support: No upper extremity supported, Feet supported Sitting balance-Leahy Scale: Good     Standing balance support: No upper extremity supported, During functional activity Standing balance-Leahy Scale: Poor Standing balance comment: Pt able to stand statically with min guard with the RUE stabilized on the bedside table but needs max assist for taking steps up toward the side of the bed.  ADL either performed or assessed with clinical judgement   ADL Overall ADL's : Needs assistance/impaired Eating/Feeding: Supervision/ safety;Sitting Eating/Feeding Details (indicate cue type and reason): simulated Grooming: Moderate assistance;Sitting Grooming Details (indicate cue type and reason):  simulated Upper Body Bathing: Moderate assistance;Sitting Upper Body Bathing Details (indicate cue type and reason): simulated Lower Body Bathing: Maximal assistance;Sit to/from stand Lower Body Bathing Details (indicate cue type and reason): simulated Upper Body Dressing : Maximal assistance;Sitting Upper Body Dressing Details (indicate cue type and reason): simulated Lower Body Dressing: Maximal assistance;Sit to/from stand Lower Body Dressing Details (indicate cue type and reason): simulated Toilet Transfer: Maximal assistance;Stand-pivot;BSC/3in1 Toilet Transfer Details (indicate cue type and reason): simulated stand pivot Toileting- Clothing Manipulation and Hygiene: Maximal assistance;Sit to/from stand Toileting - Clothing Manipulation Details (indicate cue type and reason): simulated     Functional mobility during ADLs: Maximal assistance (step pivot without assistive device) General ADL Comments: Pt with noted left hand splint not fitting properly secondary to ulnar deviation of digits and increased flexor tone.  Ortho techs came and re-applied better fitting splint for the hand.  Therapist also provided sling for support in bed and when up.  Oxygen sats at 95% or better on 4Ls nasal cannula.  When removed, they dipped down to 87% on room air.     Vision Baseline Vision/History: 1 Wears glasses (did not have them with her) Ability to See in Adequate Light: 0 Adequate Patient Visual Report: No change from baseline Vision Assessment?: No apparent visual deficits     Perception Perception: Within Functional Limits       Praxis Praxis: WFL       Pertinent Vitals/Pain Pain Assessment Pain Assessment: Faces Pain Score: 0-No pain Pain Location: L hand Pain Descriptors / Indicators: Discomfort, Grimacing, Guarding Pain Intervention(s): Monitored during session     Extremity/Trunk Assessment Upper Extremity Assessment Upper Extremity Assessment: LUE deficits/detail LUE  Deficits / Details: History of left hemiparesis secondary to older CVA.  Now with short arm volar splint in place to stabilize carpal bones.  AAROM shoulder flexion 0-80 degrees, elbow flexion 3-/5, elbow extension 3/5,  Trace digit flexion noted in the index finger but pt with increased flexor tone noted in the digits and thumb,  Did not try to range in extension secondary to fractures. LUE Sensation: decreased light touch LUE Coordination: decreased fine motor;decreased gross motor   Lower Extremity Assessment Lower Extremity Assessment: Defer to PT evaluation   Cervical / Trunk Assessment Cervical / Trunk Assessment: Other exceptions Cervical / Trunk Exceptions: increased body habitus   Communication Communication Communication: No apparent difficulties   Cognition Arousal: Alert Behavior During Therapy: WFL for tasks assessed/performed Overall Cognitive Status: Within Functional Limits for tasks assessed                                                  Home Living Family/patient expects to be discharged to:: (P) Private residence Living Arrangements: (P) Alone (husband is at hospice facility at this time) Available Help at Discharge: (P) Neighbor;Family;Other (Comment) (come by 2-3 days/week to bring by dinner and assist with yard work; was going to [employer] hours on Tuesdays and Thursdays) Type of Home: (P) Mobile home Home Access: (P) Ramped entrance     Home Layout: (P) One level     Bathroom Shower/Tub: (P) Tub/shower unit   Bathroom  Toilet: (P) Standard Bathroom Accessibility: (P) Yes   Home Equipment: (P) Rollator (4 wheels);Cane - quad;BSC/3in1;Wheelchair - manual;Shower seat;Grab bars - tub/shower;Hospital bed;Other (comment);Hand held shower head (hemi-walker)   Additional Comments: (P) was going to [facility] 8 hours/day on Tuesday and Thursday; husband recently went to hospice facility; pt alone at home now, which is new to her and she is  having increased difficulty managing things, was working with CM in regards to going to an ALF      Prior Functioning/Environment Prior Level of Function : (P) Driving;Independent/Modified Independent             Mobility Comments: (P) Mod I; Uses hemiwalker vs rollator vs QC to ambulate ~3-5 ft at a time before needing to rest; Primarily uses w/c ADLs Comments: (P) Increased difficulty with cleaning, cooking, and ADLs this past week since her legs began to swell; technically can drive but does not like to; gets groceries delivered; was going to [facility] 8 hours/day on Tuesday and Thursday; husband recently went to hospice facility; pt alone at home now, which is new to her and she is having increased difficulty managing things, was working with CM in regards to going to an ALF        OT Problem List: Decreased strength;Decreased knowledge of use of DME or AE;Decreased range of motion;Decreased coordination;Cardiopulmonary status limiting activity;Decreased activity tolerance;Impaired balance (sitting and/or standing);Impaired sensation;Pain;Impaired UE functional use      OT Treatment/Interventions: Self-care/ADL training;Patient/family education;Balance training;Neuromuscular education;Therapeutic activities;DME and/or AE instruction;Therapeutic exercise    OT Goals(Current goals can be found in the care plan section) Acute Rehab OT Goals Patient Stated Goal: Pt wants to get stronger and get to ALF in the near future OT Goal Formulation: With patient Time For Goal Achievement: 08/23/23 Potential to Achieve Goals: Good  OT Frequency: Min 1X/week       AM-PAC OT "6 Clicks" Daily Activity     Outcome Measure Help from another person eating meals?: A Little Help from another person taking care of personal grooming?: A Lot Help from another person toileting, which includes using toliet, bedpan, or urinal?: A Lot Help from another person bathing (including washing, rinsing, drying)?: A  Lot Help from another person to put on and taking off regular upper body clothing?: A Lot Help from another person to put on and taking off regular lower body clothing?: A Lot 6 Click Score: 13   End of Session Equipment Utilized During Treatment: Gait belt;Oxygen Nurse Communication: Mobility status  Activity Tolerance: Patient tolerated treatment well Patient left: in bed;with call bell/phone within reach  OT Visit Diagnosis: Unsteadiness on feet (R26.81);Other abnormalities of gait and mobility (R26.89);Muscle weakness (generalized) (M62.81);Hemiplegia and hemiparesis;Pain Hemiplegia - Right/Left: Left Hemiplegia - dominant/non-dominant: Non-Dominant Hemiplegia - caused by: Cerebral infarction Pain - Right/Left: Left Pain - part of body: Hand                Time: 7829-5621 OT Time Calculation (min): 66 min Charges:  OT General Charges $OT Visit: 1 Visit OT Evaluation $OT Eval Moderate Complexity: 1 Mod OT Treatments $Self Care/Home Management : 38-52 mins Perrin Maltese, OTR/L Acute Rehabilitation Services  Office [phone] 08/09/2023

## 2023-08-10 LAB — URINE CULTURE: Culture: >=100000 — AB

## 2023-08-10 LAB — HEMOGLOBIN A1C
Hgb A1c MFr Bld: 6.9 % — ABNORMAL HIGH (ref 4.8–5.6)
Mean Plasma Glucose: 151 mg/dL

## 2023-08-11 ENCOUNTER — Telehealth (HOSPITAL_BASED_OUTPATIENT_CLINIC_OR_DEPARTMENT_OTHER): Payer: Self-pay | Admitting: *Deleted

## 2023-08-11 NOTE — Telephone Encounter (Signed)
Post ED Visit - Positive Culture Follow-up: Unsuccessful Patient Follow-up  Culture assessed and recommendations reviewed by:  [x]  Ruben Im, Pharm.D. []  Celedonio Miyamoto, Pharm.D., BCPS AQ-ID []  Garvin Fila, Pharm.D., BCPS []  Georgina Pillion, Pharm.D., BCPS []  [place], [address].D., BCPS, AAHIVP []  Estella Husk, Pharm.D., BCPS, AAHIVP []  Sherlynn Carbon, PharmD []  Pollyann Samples, PharmD, BCPS  Positive urine culture  [x]  Patient discharged without antimicrobial prescription and treatment is now indicated []  Organism is resistant to prescribed ED discharge antimicrobial []  Patient with positive blood cultures  Plan:  Duracef 1000mg  PO BID x 5 days, Lorre Nick, MD  Unable to contact patient after 3 attempts, letter will be sent to address on file  Lysle Pearl 08/11/2023, 11:53 AM

## 2024-05-08 ENCOUNTER — Emergency Department (HOSPITAL_COMMUNITY)

## 2024-05-08 ENCOUNTER — Emergency Department (HOSPITAL_COMMUNITY)
Admission: EM | Admit: 2024-05-08 | Discharge: 2024-05-08 | Disposition: A | Discharge status: Home / Self Care | Attending: Emergency Medicine | Admitting: Emergency Medicine

## 2024-05-08 ENCOUNTER — Encounter (HOSPITAL_COMMUNITY): Payer: Self-pay

## 2024-05-08 ENCOUNTER — Other Ambulatory Visit: Payer: Self-pay

## 2024-05-08 DIAGNOSIS — I872 Venous insufficiency (chronic) (peripheral): Secondary | ICD-10-CM | POA: Insufficient documentation

## 2024-05-08 DIAGNOSIS — M25551 Pain in right hip: Secondary | ICD-10-CM | POA: Diagnosis present

## 2024-05-08 DIAGNOSIS — L97819 Non-pressure chronic ulcer of other part of right lower leg with unspecified severity: Secondary | ICD-10-CM | POA: Diagnosis not present

## 2024-05-08 DIAGNOSIS — X500XXA Overexertion from strenuous movement or load, initial encounter: Secondary | ICD-10-CM | POA: Diagnosis not present

## 2024-05-08 LAB — CBC WITH DIFFERENTIAL/PLATELET
Abs Immature Granulocytes: 0.04 10*3/uL (ref 0.00–0.07)
Basophils Absolute: 0.1 10*3/uL (ref 0.0–0.1)
Basophils Relative: 1 %
Eosinophils Absolute: 0.3 10*3/uL (ref 0.0–0.5)
Eosinophils Relative: 2 %
HCT: 41.8 % (ref 36.0–46.0)
Hemoglobin: 12.2 g/dL (ref 12.0–15.0)
Immature Granulocytes: 0 %
Lymphocytes Relative: 15 %
Lymphs Abs: 1.6 10*3/uL (ref 0.7–4.0)
MCH: 28.1 pg (ref 26.0–34.0)
MCHC: 29.2 g/dL — ABNORMAL LOW (ref 30.0–36.0)
MCV: 96.3 fL (ref 80.0–100.0)
Monocytes Absolute: 0.5 10*3/uL (ref 0.1–1.0)
Monocytes Relative: 5 %
Neutro Abs: 8.7 10*3/uL — ABNORMAL HIGH (ref 1.7–7.7)
Neutrophils Relative %: 77 %
Platelets: 404 10*3/uL — ABNORMAL HIGH (ref 150–400)
RBC: 4.34 MIL/uL (ref 3.87–5.11)
RDW: 15.3 % (ref 11.5–15.5)
WBC: 11.3 10*3/uL — ABNORMAL HIGH (ref 4.0–10.5)
nRBC: 0 % (ref 0.0–0.2)

## 2024-05-08 LAB — BASIC METABOLIC PANEL WITH GFR
Anion gap: 9 (ref 5–15)
BUN: 21 mg/dL (ref 8–23)
CO2: 26 mmol/L (ref 22–32)
Calcium: 10.3 mg/dL (ref 8.9–10.3)
Chloride: 102 mmol/L (ref 98–111)
Creatinine, Ser: 1.05 mg/dL — ABNORMAL HIGH (ref 0.44–1.00)
GFR, Estimated: 58 mL/min — ABNORMAL LOW (ref >60)
Glucose, Bld: 161 mg/dL — ABNORMAL HIGH (ref 70–99)
Potassium: 4.4 mmol/L (ref 3.5–5.1)
Sodium: 137 mmol/L (ref 135–145)

## 2024-05-08 MED ORDER — CEPHALEXIN 500 MG PO CAPS
500.0000 mg | ORAL_CAPSULE | Freq: Two times a day (BID) | ORAL | 0 refills | Status: DC
Start: 2024-05-08 — End: 2024-09-29

## 2024-05-08 MED ORDER — FENTANYL CITRATE PF 50 MCG/ML IJ SOSY
50.0000 ug | PREFILLED_SYRINGE | Freq: Once | INTRAMUSCULAR | Status: AC
  Administered 2024-05-08: 50 ug via INTRAVENOUS
  Filled 2024-05-08: qty 1

## 2024-05-08 NOTE — ED Notes (Signed)
 Ambulated pt in room with assistance. Pt only able to take a few steps, but stated it was similar to walking at home and her pain was much better. Pt gait was slow but steady.

## 2024-05-08 NOTE — Discharge Instructions (Addendum)
 The x-ray of your hip is normal.  There are no broken bones.  I suspect that you strained a muscle.  I recommend that you follow-up with your regular doctor.  You may need to have some physical therapy or strength and balance training sessions.  We are putting you on a short course of antibiotic for the wounds on your leg.  Please take them as directed.  Return for new or worsening symptoms.

## 2024-05-08 NOTE — ED Provider Notes (Signed)
 [facility] Emergency Department Provider Note MRN:  [id number]  Arrival date & time: 05/08/24     Chief Complaint   Hip Pain   History of Present Illness   Margaret [name] is a 68 y.o. year-old female presents to the ED with chief complaint of right hip pain.  She states that she was getting into bed tonight and as she was lifting up her right leg she felt a pop in her right groin.  She states that it was accompanied with severe pain.  She called EMS.  She was able to stand and transfer following this incident.  She complains of chronic lower extremity swelling secondary to fluid retention.  She states that she has chronic sores on her legs.  She denies any new complaints other than the above hip pain.  History provided by patient.   Review of Systems  Pertinent positive and negative review of systems noted in HPI.    Physical Exam   Vitals:   05/08/24 0100 05/08/24 0554  BP: (!) 182/68 135/67  Pulse: 69 68  Resp: 18 18  Temp: 98.2 F (36.8 C) 98.2 F (36.8 C)  SpO2: 93% 99%    CONSTITUTIONAL:  non toxic-appearing, NAD NEURO:  Alert and oriented x 3, CN 3-12 grossly intact EYES:  eyes equal and reactive ENT/NECK:  Supple, no stridor  CARDIO:  normal rate, regular rhythm, appears well-perfused  PULM:  No respiratory distress, CTAB GI/GU:  non-distended,  MSK/SPINE:  No gross deformities, no edema, moves all extremities  SKIN: Venous stasis ulcers to lower extremities   *Additional and/or pertinent findings included in MDM below  Diagnostic and Interventional Summary    EKG Interpretation Date/Time:    Ventricular Rate:    PR Interval:    QRS Duration:    QT Interval:    QTC Calculation:   R Axis:      Text Interpretation:         Labs Reviewed  CBC WITH DIFFERENTIAL/PLATELET - Abnormal; Notable for the following components:      Result Value   WBC 11.3 (*)    MCHC 29.2 (*)    Platelets 404 (*)    Neutro Abs 8.7 (*)     All other components within normal limits  BASIC METABOLIC PANEL WITH GFR - Abnormal; Notable for the following components:   Glucose, Bld 161 (*)    Creatinine, Ser 1.05 (*)    GFR, Estimated 58 (*)    All other components within normal limits    DG Hip Unilat W or Wo Pelvis 2-3 Views Right  Final Result      Medications  fentaNYL  (SUBLIMAZE ) injection 50 mcg (50 mcg Intravenous Given 05/08/24 0320)     Procedures  /  Critical Care Procedures  ED Course and Medical Decision Making  I have reviewed the triage vital signs, the nursing notes, and pertinent available records from the EMR.  Social Determinants Affecting Complexity of Care: Patient has no clinically significant social determinants affecting this chief complaint..   ED Course:    Medical Decision Making Patient here with right hip pain.  States that she felt a pop after lifting her leg onto the bed.  She was able to stand and transfer onto the EMS stretcher following the injury.  Plain films of the right hip are negative for fracture or dislocation.  I suspect that the patient likely strained a muscle.  She is able to stand and ambulate in the ED.  Labs are fairly reassuring.  She does have a small wound/venous stasis ulcer on the right leg.  There is some mild cellulitic changes, will start patient on antibiotics for this.  I do not think that she requires admission to the hospital.  She has good outpatient follow-up.  She states that she will contact her care team today.  Amount and/or Complexity of Data Reviewed Labs: ordered. Radiology: ordered and independent interpretation performed.    Details: No fracture or dislocation seen  Risk Prescription drug management.         Consultants: No consultations were needed in caring for this patient.   Treatment and Plan: I considered admission due to patient's initial presentation, but after considering the examination and diagnostic results, patient will not  require admission and can be discharged with outpatient follow-up.    Final Clinical Impressions(s) / ED Diagnoses     ICD-10-CM   1. Right hip pain  M25.551     2. Venous stasis ulcer of other part of right lower leg without varicose veins, unspecified ulcer stage (HCC)  I87.2    L97.819       ED Discharge Orders          Ordered    cephALEXin  (KEFLEX ) 500 MG capsule  2 times daily        05/08/24 0322              Discharge Instructions Discussed with and Provided to Patient:     Discharge Instructions      The x-ray of your hip is normal.  There are no broken bones.  I suspect that you strained a muscle.  I recommend that you follow-up with your regular doctor.  You may need to have some physical therapy or strength and balance training sessions.  We are putting you on a short course of antibiotic for the wounds on your leg.  Please take them as directed.  Return for new or worsening symptoms.       Sherel Dikes, PA-C 05/08/24 1308    Kelsey Patricia, MD 05/08/24 [phone]

## 2024-05-08 NOTE — ED Triage Notes (Signed)
 PER EMS: pt is from home with c/o right hip pain after lifting her leg up when she was getting into bed. She heard a pop. She uses a wheelchair most of the time. She has wounds on her legs. 2L  O2 at baseline.  BP- 110/70, HR-71, RR-14, O2-99%, CBG-164

## 2024-08-01 ENCOUNTER — Emergency Department (HOSPITAL_COMMUNITY)

## 2024-08-01 ENCOUNTER — Emergency Department (HOSPITAL_COMMUNITY)
Admission: EM | Admit: 2024-08-01 | Discharge: 2024-08-02 | Disposition: A | Discharge status: Home / Self Care | Attending: Emergency Medicine | Admitting: Emergency Medicine

## 2024-08-01 ENCOUNTER — Encounter (HOSPITAL_COMMUNITY): Payer: Self-pay

## 2024-08-01 ENCOUNTER — Other Ambulatory Visit: Payer: Self-pay

## 2024-08-01 DIAGNOSIS — Z7901 Long term (current) use of anticoagulants: Secondary | ICD-10-CM | POA: Diagnosis not present

## 2024-08-01 DIAGNOSIS — E119 Type 2 diabetes mellitus without complications: Secondary | ICD-10-CM | POA: Insufficient documentation

## 2024-08-01 DIAGNOSIS — S42201A Unspecified fracture of upper end of right humerus, initial encounter for closed fracture: Secondary | ICD-10-CM | POA: Diagnosis not present

## 2024-08-01 DIAGNOSIS — J449 Chronic obstructive pulmonary disease, unspecified: Secondary | ICD-10-CM | POA: Diagnosis not present

## 2024-08-01 DIAGNOSIS — S0990XA Unspecified injury of head, initial encounter: Secondary | ICD-10-CM | POA: Diagnosis not present

## 2024-08-01 DIAGNOSIS — S4991XA Unspecified injury of right shoulder and upper arm, initial encounter: Secondary | ICD-10-CM | POA: Diagnosis present

## 2024-08-01 DIAGNOSIS — Y92009 Unspecified place in unspecified non-institutional (private) residence as the place of occurrence of the external cause: Secondary | ICD-10-CM | POA: Insufficient documentation

## 2024-08-01 DIAGNOSIS — Z91040 Latex allergy status: Secondary | ICD-10-CM | POA: Diagnosis not present

## 2024-08-01 DIAGNOSIS — W19XXXA Unspecified fall, initial encounter: Secondary | ICD-10-CM

## 2024-08-01 DIAGNOSIS — Z79899 Other long term (current) drug therapy: Secondary | ICD-10-CM | POA: Diagnosis not present

## 2024-08-01 DIAGNOSIS — I509 Heart failure, unspecified: Secondary | ICD-10-CM | POA: Insufficient documentation

## 2024-08-01 DIAGNOSIS — S42291A Other displaced fracture of upper end of right humerus, initial encounter for closed fracture: Secondary | ICD-10-CM

## 2024-08-01 DIAGNOSIS — I110 Hypertensive heart disease with heart failure: Secondary | ICD-10-CM | POA: Diagnosis not present

## 2024-08-01 DIAGNOSIS — W06XXXA Fall from bed, initial encounter: Secondary | ICD-10-CM | POA: Insufficient documentation

## 2024-08-01 DIAGNOSIS — Z794 Long term (current) use of insulin: Secondary | ICD-10-CM | POA: Insufficient documentation

## 2024-08-01 LAB — CBC
HCT: 39.7 % (ref 36.0–46.0)
Hemoglobin: 11.7 g/dL — ABNORMAL LOW (ref 12.0–15.0)
MCH: 26.9 pg (ref 26.0–34.0)
MCHC: 29.5 g/dL — ABNORMAL LOW (ref 30.0–36.0)
MCV: 91.3 fL (ref 80.0–100.0)
Platelets: 335 K/uL (ref 150–400)
RBC: 4.35 MIL/uL (ref 3.87–5.11)
RDW: 16.8 % — ABNORMAL HIGH (ref 11.5–15.5)
WBC: 10.8 K/uL — ABNORMAL HIGH (ref 4.0–10.5)
nRBC: 0 % (ref 0.0–0.2)

## 2024-08-01 MED ORDER — GABAPENTIN 300 MG PO CAPS
300.0000 mg | ORAL_CAPSULE | Freq: Every day | ORAL | Status: DC
  Administered 2024-08-02: 300 mg via ORAL
  Filled 2024-08-01: qty 3

## 2024-08-01 MED ORDER — ACETAMINOPHEN 500 MG PO TABS: 500.0000 mg | ORAL_TABLET | Freq: Four times a day (QID) | ORAL | Status: DC | PRN

## 2024-08-01 MED ORDER — DULOXETINE HCL 30 MG PO CPEP
60.0000 mg | ORAL_CAPSULE | Freq: Every day | ORAL | Status: DC
  Administered 2024-08-02: 60 mg via ORAL
  Filled 2024-08-01: qty 2

## 2024-08-01 MED ORDER — FENTANYL CITRATE PF 50 MCG/ML IJ SOSY
50.0000 ug | PREFILLED_SYRINGE | Freq: Once | INTRAMUSCULAR | Status: AC
  Administered 2024-08-01: 50 ug via INTRAVENOUS
  Filled 2024-08-01: qty 1

## 2024-08-01 MED ORDER — INSULIN GLARGINE 100 UNIT/ML ~~LOC~~ SOLN
54.0000 [IU] | Freq: Every day | SUBCUTANEOUS | Status: DC
Start: 2024-08-02 — End: 2024-08-02
  Filled 2024-08-01: qty 0.54

## 2024-08-01 MED ORDER — ACETAMINOPHEN 325 MG PO TABS: 650.0000 mg | ORAL_TABLET | Freq: Four times a day (QID) | ORAL | Status: DC | PRN

## 2024-08-01 MED ORDER — BUMETANIDE 2 MG PO TABS
2.0000 mg | ORAL_TABLET | Freq: Every day | ORAL | Status: DC
  Administered 2024-08-02: 2 mg via ORAL
  Filled 2024-08-01: qty 1

## 2024-08-01 MED ORDER — OXYCODONE HCL 5 MG PO TABS
5.0000 mg | ORAL_TABLET | ORAL | Status: DC | PRN
  Administered 2024-08-02 (×2): 5 mg via ORAL
  Filled 2024-08-01 (×2): qty 1

## 2024-08-01 MED ORDER — INSULIN ASPART 100 UNIT/ML ~~LOC~~ SOLN: 0.0000 [IU] | Freq: Three times a day (TID) | SUBCUTANEOUS | Status: DC

## 2024-08-01 MED ORDER — INSULIN ASPART 100 UNIT/ML IJ SOLN: 0.0000 [IU] | Freq: Three times a day (TID) | INTRAMUSCULAR | Status: DC

## 2024-08-01 MED ORDER — BISOPROLOL FUMARATE 10 MG PO TABS
10.0000 mg | ORAL_TABLET | Freq: Every day | ORAL | Status: DC
  Administered 2024-08-02: 10 mg via ORAL
  Filled 2024-08-01: qty 1

## 2024-08-01 MED ORDER — BISOPROLOL FUMARATE 10 MG PO TABS: 10.0000 mg | ORAL_TABLET | Freq: Every day | ORAL | Status: DC

## 2024-08-01 MED ORDER — INSULIN GLARGINE 100 UNIT/ML ~~LOC~~ SOLN
54.0000 [IU] | Freq: Every day | SUBCUTANEOUS | Status: DC
Start: 2024-08-02 — End: 2024-08-01

## 2024-08-01 MED ORDER — BUMETANIDE 2 MG PO TABS: 2.0000 mg | ORAL_TABLET | Freq: Every day | ORAL | Status: DC

## 2024-08-01 MED ORDER — APIXABAN 2.5 MG PO TABS
2.5000 mg | ORAL_TABLET | Freq: Two times a day (BID) | ORAL | Status: DC
  Administered 2024-08-02 (×2): 2.5 mg via ORAL
  Filled 2024-08-01 (×2): qty 1

## 2024-08-01 MED ORDER — ALBUTEROL SULFATE HFA 108 (90 BASE) MCG/ACT IN AERS: 2.0000 | INHALATION_SPRAY | RESPIRATORY_TRACT | Status: DC | PRN

## 2024-08-01 MED ORDER — PANTOPRAZOLE SODIUM 20 MG PO TBEC
20.0000 mg | DELAYED_RELEASE_TABLET | Freq: Every day | ORAL | Status: DC
  Administered 2024-08-02: 20 mg via ORAL
  Filled 2024-08-01: qty 1

## 2024-08-01 MED ORDER — ROSUVASTATIN CALCIUM 5 MG PO TABS
10.0000 mg | ORAL_TABLET | Freq: Every day | ORAL | Status: DC
  Administered 2024-08-02: 10 mg via ORAL
  Filled 2024-08-01: qty 2

## 2024-08-01 NOTE — ED Triage Notes (Signed)
 Pt reports falling out of bed and landing on left shoulder. Pt has deformity to left shoulder with skin tears to arm. Pt reports generalized pain to right arm. Pt denies hitting head or other injuries. Pt has full sensation to left arm and hand. Pt takes Eliquis .

## 2024-08-01 NOTE — ED Provider Notes (Signed)
 [facility] AT [facility] Provider Note   CSN: [id number] Arrival date & time: 08/01/24  2049     Patient presents with: Fall and Shoulder Injury   Margaret [name] is a 68 y.o. female.  {Add pertinent medical, surgical, social history, OB history to YEP:67052}  Fall  Shoulder Injury  Patient presents after a fall.  Medical history includes COPD, CHF, HTN, HLD, DM, CVA with left hemiaplasia.  At baseline, she gets around her home in a wheelchair.  Tonight, she had a fall out of bed.  When she fell, she struck her left shoulder.  She since had pain to her left upper arm.  She denies any other areas of significant pain.  She denies striking her head.  She is on Eliquis  and last dose was this morning.       Prior to Admission medications   Medication Sig Start Date End Date Taking? Authorizing Provider  acetaminophen  (TYLENOL ) 325 MG tablet Take 2 tablets (650 mg total) by mouth every 4 (four) hours as needed for mild pain, headache or fever. 11/27/20   Danton Reyes DASEN, MD  albuterol  (PROVENTIL  HFA;VENTOLIN  HFA) 108 (90 BASE) MCG/ACT inhaler Inhale 2 puffs into the lungs every 4 (four) hours as needed for wheezing or shortness of breath. Reported on 06/12/2016 04/30/12   Raenelle Donalda HERO, MD  apixaban  (ELIQUIS ) 2.5 MG TABS tablet Take 2.5 mg by mouth 2 (two) times daily.    [provider]  bisoprolol  (ZEBETA ) 10 MG tablet Take 1 tablet (10 mg total) by mouth daily. 11/28/20   Danton Reyes DASEN, MD  bumetanide  (BUMEX ) 1 MG tablet Take 2 mg by mouth daily.    [provider]  cephALEXin  (KEFLEX ) 500 MG capsule Take 1 capsule (500 mg total) by mouth 2 (two) times daily. 05/08/24   Vicky Charleston, PA-C  cholecalciferol  (VITAMIN D ) 1000 units tablet Take 1,000 Units by mouth daily.     [provider]  diclofenac  Sodium (VOLTAREN ) 1 % GEL Apply 4 g topically 4 (four) times daily. 08/11/22   Emil Share, DO  DULoxetine  (CYMBALTA ) 60  MG capsule Take 60 mg by mouth daily.    [provider]  gabapentin  (NEURONTIN ) 300 MG capsule Take 300 mg by mouth at bedtime.    [provider]  insulin  aspart (NOVOLOG ) 100 UNIT/ML injection Inject 0-5 Units into the skin at bedtime. Correction coverage: HS scale CBG < 70: implement hypoglycemia protocol CBG 70 - 120: 0 units CBG 121 - 150: 0 units CBG 151 - 200: 0 units CBG 201 - 250: 2 units CBG 251 - 300: 3 units CBG 301 - 350: 4 units CBG 351 - 400: 5 units CBG > 400 call MD. 11/28/20   Hongalgi, Anand D, MD  insulin  aspart (NOVOLOG ) 100 UNIT/ML injection Inject 0-20 Units into the skin 3 (three) times daily with meals. Correction coverage: Resistant (obese, steroids) CBG < 70: Implement Hypoglycemia protocol. CBG 70 - 120: 0 units CBG 121 - 150: 3 units CBG 151 - 200: 4 units CBG 201 - 250: 7 units CBG 251 - 300: 11 units CBG 301 - 350: 15 units CBG 351 - 400: 20 units CBG > 400 call MD. 11/28/20   Hongalgi, Anand D, MD  insulin  glargine (LANTUS ) 100 UNIT/ML injection Inject 0.54 mLs (54 Units total) into the skin daily. 11/28/20   Danton Reyes DASEN, MD  pantoprazole  (PROTONIX ) 20 MG tablet Take 20 mg by mouth daily.  [provider]  rosuvastatin  (CRESTOR ) 10 MG tablet Take 10 mg by mouth daily.    [provider]  senna-docusate (SENOKOT-S) 8.6-50 MG tablet Take 1 tablet by mouth at bedtime as needed for mild constipation. 11/27/20   Danton Reyes DASEN, MD    Allergies: Codeine, Lisinopril, and Latex    Review of Systems  Musculoskeletal:  Positive for arthralgias.  All other systems reviewed and are negative.   Updated Vital Signs BP (!) 162/78 (BP Location: Right Arm)   Pulse 69   Temp 97.8 F (36.6 C) (Oral)   Resp 20   SpO2 94%   Physical Exam Vitals and nursing note reviewed.  Constitutional:      General: She is not in acute distress.    Appearance: Normal appearance. She is well-developed. She is not ill-appearing,  toxic-appearing or diaphoretic.  HENT:     Head: Normocephalic and atraumatic.     Right Ear: External ear normal.     Left Ear: External ear normal.     Nose: Nose normal.     Mouth/Throat:     Mouth: Mucous membranes are moist.  Eyes:     Extraocular Movements: Extraocular movements intact.     Conjunctiva/sclera: Conjunctivae normal.  Cardiovascular:     Rate and Rhythm: Normal rate and regular rhythm.     Pulses: Normal pulses.  Pulmonary:     Effort: Pulmonary effort is normal. No respiratory distress.  Abdominal:     General: There is no distension.     Palpations: Abdomen is soft.     Tenderness: There is no abdominal tenderness.  Musculoskeletal:        General: Tenderness and signs of injury present. No swelling.     Cervical back: Normal range of motion and neck supple.  Skin:    General: Skin is warm and dry.     Capillary Refill: Capillary refill takes less than 2 seconds.     Coloration: Skin is not jaundiced or pale.  Neurological:     General: No focal deficit present.     Mental Status: She is alert and oriented to person, place, and time.  Psychiatric:        Mood and Affect: Mood normal.        Behavior: Behavior normal.     (all labs ordered are listed, but only abnormal results are displayed) Labs Reviewed - No data to display  EKG: None  Radiology: No results found.  {Document cardiac monitor, telemetry assessment procedure when appropriate:32947} Procedures   Medications Ordered in the ED - No data to display    {Click here for ABCD2, HEART and other calculators REFRESH Note before signing:1}                              Medical Decision Making Amount and/or Complexity of Data Reviewed Labs: ordered. Radiology: ordered.  Risk Prescription drug management.   This patient presents to the ED for concern of fall, this involves an extensive number of treatment options, and is a complaint that carries with it a high risk of complications  and morbidity.  The differential diagnosis includes acute injuries   Co morbidities / Chronic conditions that complicate the patient evaluation  COPD, CHF, HTN, HLD, DM, CVA with left hemiaplasia   Additional history obtained:  Additional history obtained from EMR External records from outside source obtained and reviewed including N/A   Lab Tests:  I  Ordered, and personally interpreted labs.  The pertinent results include:  ***   Imaging Studies ordered:  I ordered imaging studies including x-ray of chest, pelvis, left humerus; CT of head and cervical spine I independently visualized and interpreted imaging which showed left humeral head and neck fractures, no other acute findings I agree with the radiologist interpretation   Cardiac Monitoring: / EKG:  The patient was maintained on a cardiac monitor.  I personally viewed and interpreted the cardiac monitored which showed an underlying rhythm of: Sinus rhythm   Problem List / ED Course / Critical interventions / Medication management  Patient presenting for upper right arm pain after a fall out of bed.  On arrival in the ED, vital signs notable for hypertension.  On exam, patient has sling on right arm.  She endorses pain in area of proximal humerus.  Distal pulses are intact.  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  She is able to provide a very weak grip which she states is her baseline.  She denies any other areas of injury concern.  She did receive fentanyl  prior to arrival.  She states that she still has pain.  Additional fentanyl  was ordered.  Workup was initiated.  X-ray of humerus does confirm proximal humeral head and neck fractures with mild displacement and impaction.  Sling was ordered.  Remaining imaging studies did not show any acute findings.  Patient was informed of her injury.  She states that she lives at home on her own.  She does not currently have any home health care  services or any family/friends support.  [facility] [place]  Although she has very limited function of her left arm at baseline, this new injury may further limit her mobility and ability to care for herself.  Patient to remain in the ED for physical therapy evaluation and TOC consult tomorrow.  Home medications were ordered.*** I ordered medication including ***   Reevaluation of the patient after these medicines showed that the patient *** I have reviewed the patients home medicines and have made adjustments as needed   Consultations Obtained:  I requested consultation with the ***,  and discussed lab and imaging findings as well as pertinent plan - they recommend: ***   Social Determinants of Health:  ***   Test / Admission - Considered:  ***   {Document critical care time when appropriate  Document review of labs and clinical decision tools ie CHADS2VASC2, etc  Document your independent review of radiology images and any outside records  Document your discussion with family members, caretakers and with consultants  Document social determinants of health affecting pt's care  Document your decision making why or why not admission, treatments were needed:32947:::1}   Final diagnoses:  None    ED Discharge Orders     None

## 2024-08-02 LAB — COMPREHENSIVE METABOLIC PANEL WITH GFR
ALT: 10 U/L (ref 0–44)
AST: 15 U/L (ref 15–41)
Albumin: 2.6 g/dL — ABNORMAL LOW (ref 3.5–5.0)
Alkaline Phosphatase: 94 U/L (ref 38–126)
Anion gap: 6 (ref 5–15)
BUN: 11 mg/dL (ref 8–23)
CO2: 26 mmol/L (ref 22–32)
Calcium: 9 mg/dL (ref 8.9–10.3)
Chloride: 106 mmol/L (ref 98–111)
Creatinine, Ser: 0.95 mg/dL (ref 0.44–1.00)
GFR, Estimated: >60 mL/min (ref >60)
Glucose, Bld: 137 mg/dL — ABNORMAL HIGH (ref 70–99)
Potassium: 3.8 mmol/L (ref 3.5–5.1)
Sodium: 138 mmol/L (ref 135–145)
Total Bilirubin: 0.7 mg/dL (ref 0.0–1.2)
Total Protein: 6.1 g/dL — ABNORMAL LOW (ref 6.5–8.1)

## 2024-08-02 LAB — CBG MONITORING, ED
Glucose-Capillary: 108 mg/dL — ABNORMAL HIGH (ref 70–99)
Glucose-Capillary: 140 mg/dL — ABNORMAL HIGH (ref 70–99)

## 2024-08-02 NOTE — ED Notes (Signed)
 Pt verbalizes understanding of pain medication risks and benefits. Pt agrees with fall precuations

## 2024-08-02 NOTE — ED Notes (Signed)
Messaged pharmacy to verify medications 

## 2024-08-02 NOTE — Progress Notes (Addendum)
 12pm: CSW received call from [place] who states patient has been accepted at [facility] of [address].  Patient will go to [facility] [place] [facility]. The number to call for report is [phone].  RN and MD aware of discharge plan.   11:08am: CSW received call from [place] who states [facility] has no beds and the referral with [facility] in [place] is pending. [place] will speak with patient and return call to CSW.  10:30am: CSW received message from PT stating SNF has been recommended.  CSW spoke with [place] at [facility] [place] [phone]) to discuss patient's need for SNF. [place] states she will speak with therapy team for further discussion and return call to CSW. CSW sent PT note via fax to [phone] for review.  CSW will complete FL2 and fax patient's clinicals out to obtain bed offers.  Niels Portugal, MSW, LCSW Transitions of Care  Clinical Social Worker II [phone]

## 2024-08-02 NOTE — ED Notes (Signed)
 Assumed pt care at this time VSS connected to monitor

## 2024-08-02 NOTE — ED Notes (Signed)
 Assisted pt with breakfast set up. Pt moved to hallway due to ER census. Remains on 1-2L [place] for baseline oxygen requirements. NAD

## 2024-08-02 NOTE — ED Notes (Signed)
 [facility] here to take pt to [facility] then plan to transport to [facility] for further plan of care. See SW note

## 2024-08-02 NOTE — ED Notes (Signed)
 CCMD called to place the patient on cardiac monitoring services.

## 2024-08-02 NOTE — Evaluation (Signed)
 Physical Therapy Evaluation Patient Details Name: Margaret [name] MRN: [id number] DOB: Apr 04, 1956 Today's Date: 08/02/2024  History of Present Illness  Pt is 68 yo presenting after a fall on 9/9 to [facility]. Humeral head and neck fractures with mild displacement, impaction/shortening with widened humeroacromial space. PMH significant for panic attacks, chronic Diastolic CHF, s/p Mitral Valve repair with Annoloplasty on Eliquis , Endocarditis, COPD, Pulmonary HTN, Obesity, SAH, DM2, Splenic infarction, prior CVA with residual Lt sided weakness, asthma, anemia  Clinical Impression  Pt is presenting below baseline level of functioning. Prior to hospitalization pt was mod I with transfers and short distance gait, able to navigate in manual W/C. Currently pt is Mod A to Total A for bed mobility, Min A for sit to stand and transfers. Pt motivated to return home; stating that normally she can get out of bed by herself but would indicate L arm and state but I used this arm. Pt educated that currently she can't use that arm so this is her mobility currently without the help of her LUE; pt stated understanding. Due to pt current functional status, home set up and available assistance at home recommending skilled physical therapy services < 3 hours/day in order to address strength, balance and functional mobility to decrease risk for falls, injury, immobility, skin break down and re-hospitalization.          If plan is discharge home, recommend the following: A little help with walking and/or transfers;Assist for transportation;Assistance with cooking/housework;Help with stairs or ramp for entrance   Can travel by private vehicle   No    Equipment Recommendations None recommended by PT  Recommendations for Other Services  OT consult    Functional Status Assessment Patient has had a recent decline in their functional status and demonstrates the ability to make significant improvements in function in a  reasonable and predictable amount of time.     Precautions / Restrictions Precautions Precautions: Fall Recall of Precautions/Restrictions: Impaired Required Braces or Orthoses: Sling Restrictions Weight Bearing Restrictions Per Provider Order: Yes LUE Weight Bearing Per Provider Order: Non weight bearing      Mobility  Bed Mobility Overal bed mobility: Needs Assistance Bed Mobility: Supine to Sit, Sit to Supine     Supine to sit: Mod assist Sit to supine: Total assist   General bed mobility comments: Mod A for bil LE and trunk to get to sitting Edge of stretcher. Total A to get back to supine with assist at trunk and bil LE. Pt reports that she does not have alot of strength in LUE at baseline but she did use it to help her get in/out of bed.    Transfers Overall transfer level: Needs assistance Equipment used: 1 person hand held assist Transfers: Sit to/from Stand, Bed to chair/wheelchair/BSC Sit to Stand: Min assist   Step pivot transfers: Min assist       General transfer comment: Min A for sit to stand from stretcher with HHA and stepping from stretcher <> BSC with Min A for balance and unilateral UE support with HHA, support on BSC and stretcher.    Ambulation/Gait             Pre-gait activities: took steps at [facility] A from stretcher <> BSC with short partial step through shuffling steps with decreased stance time on the LLE due to previous CVA General Gait Details: did not attempt further gait at this time due to fatigue     Balance Overall balance assessment: Needs assistance Sitting-balance  support: Single extremity supported, Feet supported Sitting balance-Leahy Scale: Fair Sitting balance - Comments: SBA   Standing balance support: Single extremity supported, During functional activity Standing balance-Leahy Scale: Poor Standing balance comment: reliant on unilateral UE support and external support to prevent fall       Pertinent Vitals/Pain Pain  Assessment Pain Assessment: Faces Faces Pain Scale: Hurts even more Pain Location: L shoulder Pain Descriptors / Indicators: Aching, Discomfort, Grimacing, Guarding Pain Intervention(s): Limited activity within patient's tolerance, Monitored during session, Repositioned    Home Living Family/patient expects to be discharged to:: Private residence Living Arrangements: Alone Available Help at Discharge: Other (Comment) ([facility]es to [facility]) Type of Home: Mobile home Home Access: Ramped entrance       Home Layout: One level Home Equipment: Rollator (4 wheels);Cane - quad;Wheelchair - manual;Shower seat;Hospital bed;Grab bars - tub/shower;BSC/3in1      Prior Function Prior Level of Function : Independent/Modified Independent;Needs assist       Physical Assist : ADLs (physical)   ADLs (physical): Bathing Mobility Comments: Mod I for transfers, short distance walk with quad cane. and navigating manual W/C ADLs Comments: pt gets help with [facility] at [facility]     Extremity/Trunk Assessment   Upper Extremity Assessment Upper Extremity Assessment: Defer to OT evaluation;Generalized weakness;LUE deficits/detail LUE Deficits / Details: recent fracture in humeral head/neck    Lower Extremity Assessment Lower Extremity Assessment: Generalized weakness    Cervical / Trunk Assessment Cervical / Trunk Assessment: Kyphotic  Communication   Communication Communication: No apparent difficulties    Cognition Arousal: Alert Behavior During Therapy: WFL for tasks assessed/performed   PT - Cognitive impairments: No apparent impairments     Following commands: Intact       Cueing Cueing Techniques: Verbal cues     General Comments General comments (skin integrity, edema, etc.): pt assisted to [facility] with very strong urine odor; pt states this is normal and RN aware.        Assessment/Plan    PT Assessment Patient needs continued PT services  PT Problem List Decreased  strength;Decreased balance;Decreased mobility;Decreased safety awareness;Pain       PT Treatment Interventions DME instruction;Functional mobility training;Gait training;Therapeutic activities;Therapeutic exercise;Patient/family education;Wheelchair mobility training    PT Goals (Current goals can be found in the Care Plan section)  Acute Rehab PT Goals Patient Stated Goal: to improve mobility PT Goal Formulation: With patient Time For Goal Achievement: 08/16/24 Potential to Achieve Goals: Good    Frequency Min 2X/week        AM-PAC PT 6 Clicks Mobility  Outcome Measure Help needed turning from your back to your side while in a flat bed without using bedrails?: A Lot Help needed moving from lying on your back to sitting on the side of a flat bed without using bedrails?: A Lot Help needed moving to and from a bed to a chair (including a wheelchair)?: A Lot Help needed standing up from a chair using your arms (e.g., wheelchair or bedside chair)?: A Little Help needed to walk in hospital room?: A Lot Help needed climbing 3-5 steps with a railing? : Total 6 Click Score: 12    End of Session Equipment Utilized During Treatment: Other (comment);Oxygen (sling) Activity Tolerance: Patient limited by pain;Patient tolerated treatment well Patient left: in bed;with call bell/phone within reach Nurse Communication: Mobility status PT Visit Diagnosis: Unsteadiness on feet (R26.81);Other abnormalities of gait and mobility (R26.89);Muscle weakness (generalized) (M62.81)    Time: 9059-8979 PT Time Calculation (min) (ACUTE ONLY):  40 min   Charges:   PT Evaluation $PT Eval Low Complexity: 1 Low PT Treatments $Therapeutic Activity: 23-37 mins PT General Charges $$ ACUTE PT VISIT: 1 Visit         Dorothyann Maier, DPT, CLT  Acute Rehabilitation Services Office: [phone] (Secure chat preferred)   Dorothyann VEAR Maier 08/02/2024, 10:36 AM

## 2024-08-02 NOTE — [place] FL2 (Signed)
 [place]  MEDICAID FL2 LEVEL OF CARE FORM     IDENTIFICATION  Patient Name: Margaret [name] Birthdate: 1955/12/21 Sex: female Admission Date (Current Location): 08/01/2024  [facility] and [place] Number:  Chiropodist and Address:  [facility] [place], [address] [place], [place] [address]      Provider Number: [id number]  Attending Physician Name and Address:  Melvenia Motto, MD  Relative Name and Phone Number:       Current Level of Care: Hospital Recommended Level of Care: Skilled Nursing Facility Prior Approval Number:    Date Approved/Denied:   PASRR Number: [id number]  Discharge Plan: SNF    Current Diagnoses: Patient Active Problem List   Diagnosis Date Noted   Acute renal failure superimposed on stage 3a chronic kidney disease (HCC) 11/18/2020   Closed left femoral fracture (HCC) 11/14/2020   Hyperglycemia due to diabetes mellitus (HCC) 11/14/2020   Fall at home, initial encounter 11/14/2020   Left shoulder pain 06/29/2016   Encounter for therapeutic drug monitoring 06/17/2016   Spastic hemiplegia affecting left nondominant side (HCC) 06/12/2016   Generalized anxiety disorder 06/12/2016   Muscle spasticity    Adjustment disorder with anxious mood    Subtherapeutic anticoagulation    PAF (paroxysmal atrial fibrillation) (HCC)    Hemiplegia of nondominant side, late effect of cerebrovascular disease (HCC)    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    Hemiparesis and dysphasia as late effect of cerebrovascular accident (CVA) (HCC)    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)    Interstitial edema    Type 2 diabetes mellitus with peripheral neuropathy (HCC)    Debilitated 05/12/2016   Left hemiparesis (HCC) 05/12/2016   History of subarachnoid hemorrhage    SOB (shortness of breath)    History of CVA with residual deficit    Dysarthria, post-stroke    Chronic obstructive pulmonary disease (HCC)     Tachypnea    Bradycardia    Hypokalemia    Leukocytosis    SIRS (systemic inflammatory response syndrome) (HCC)    Acute blood loss anemia    Thrombocytosis    S/P mitral valve repair 04/30/2016   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/16/2016   Panic disorder without agoraphobia with severe panic attacks    Acute on chronic diastolic congestive heart failure due to valvular disease (HCC) 04/14/2016   Acute on chronic diastolic congestive heart failure due to valvular disease (HCC) 04/14/2016   Chronic diastolic congestive heart failure due to valvular disease (HCC) 04/13/2016   History of stroke 04/13/2016   Cerebrovascular accident (CVA) due to occlusion of right carotid artery (HCC) 03/25/2016   Splenic infarct 03/25/2016   Type 2 diabetes mellitus with circulatory disorder (HCC) 03/25/2016   Mitral regurgitation 03/06/2016   HLD (hyperlipidemia) 03/06/2016   Right carotid artery occlusion    Epigastric abdominal tenderness on direct palpation    Endocarditis of mitral valve    Positive ANA (antinuclear antibody)    Streptococcus viridans infection 02/04/2016   Cerebrovascular accident (CVA) due to embolism of cerebral artery (HCC)    Subarachnoid hemorrhage (HCC) 02/01/2016   Stroke (HCC)    Visual changes    Pulmonary infiltrates 01/23/2016   Pulmonary hypertension assoc with unclear multi-factorial mechanisms (HCC) 01/23/2016   Splenic infarction 01/14/2016   Insulin  dependent diabetes mellitus 01/14/2016   Essential hypertension 01/14/2016   Hepatic cirrhosis (HCC) 01/14/2016   COPD (chronic obstructive pulmonary disease) (HCC) 04/27/2012  Chronic diastolic CHF (congestive heart failure) (HCC) 04/27/2012   Morbid obesity (HCC) 04/27/2012   Hypoxia 04/27/2012   Dyspnea on exertion 04/27/2012   Smoker 04/27/2012    Orientation RESPIRATION BLADDER Height & Weight     Self, Situation, Place, Time  Normal Continent Weight:   Height:     BEHAVIORAL  SYMPTOMS/MOOD NEUROLOGICAL BOWEL NUTRITION STATUS      Continent Diet (Carb Modified)  AMBULATORY STATUS COMMUNICATION OF NEEDS Skin   Extensive Assist Verbally Normal                       Personal Care Assistance Level of Assistance  Bathing, Dressing, Feeding Bathing Assistance: Maximum assistance Feeding assistance: Limited assistance Dressing Assistance: Maximum assistance     Functional Limitations Info  Sight, Hearing, Speech Sight Info: Adequate Hearing Info: Adequate Speech Info: Adequate    SPECIAL CARE FACTORS FREQUENCY  PT (By licensed PT), OT (By licensed OT)     PT Frequency: 5x weekly OT Frequency: 5x weekly            Contractures Contractures Info: Not present    Additional Factors Info  Code Status, Allergies Code Status Info: Full Code Allergies Info: Codeine  Enoxaparin   Glimepiride  Lisinopril  Rivaroxaban   Semaglutide  Latex           Current Medications (08/02/2024):  This is the current hospital active medication list Current Facility-Administered Medications  Medication Dose Route Frequency Provider Last Rate Last Admin   acetaminophen  (TYLENOL ) tablet 500 mg  500 mg Oral Q6H PRN Melvenia Motto, MD       albuterol  (VENTOLIN  HFA) 108 (90 Base) MCG/ACT inhaler 2 puff  2 puff Inhalation Q4H PRN Melvenia Motto, MD       apixaban  (ELIQUIS ) tablet 2.5 mg  2.5 mg Oral BID Melvenia Motto, MD   2.5 mg at 08/02/24 1027   bisoprolol  (ZEBETA ) tablet 10 mg  10 mg Oral Daily Melvenia Motto, MD   10 mg at 08/02/24 1027   bumetanide  (BUMEX ) tablet 2 mg  2 mg Oral Daily Melvenia Motto, MD   2 mg at 08/02/24 1027   DULoxetine  (CYMBALTA ) DR capsule 60 mg  60 mg Oral Daily Melvenia Motto, MD   60 mg at 08/02/24 1027   gabapentin  (NEURONTIN ) capsule 300 mg  300 mg Oral QHS Melvenia Motto, MD   300 mg at 08/02/24 0153   insulin  aspart (novoLOG ) injection 0-20 Units  0-20 Units Subcutaneous TID THEOPOLIS Melvenia Motto, MD       insulin  glargine (LANTUS ) injection 54 Units  54 Units  Subcutaneous Daily Melvenia Motto, MD       oxyCODONE  (Oxy IR/ROXICODONE ) immediate release tablet 5 mg  5 mg Oral Q3H PRN Melvenia Motto, MD   5 mg at 08/02/24 0153   pantoprazole  (PROTONIX ) EC tablet 20 mg  20 mg Oral Daily Melvenia Motto, MD   20 mg at 08/02/24 1027   rosuvastatin  (CRESTOR ) tablet 10 mg  10 mg Oral Daily Melvenia Motto, MD   10 mg at 08/02/24 1027   Current Outpatient Medications  Medication Sig Dispense Refill   apixaban  (ELIQUIS ) 2.5 MG TABS tablet Take 2.5 mg by mouth 2 (two) times daily.     acetaminophen  (TYLENOL ) 325 MG tablet Take 2 tablets (650 mg total) by mouth every 4 (four) hours as needed for mild pain, headache or fever.     albuterol  (PROVENTIL  HFA;VENTOLIN  HFA) 108 (90 BASE) MCG/ACT inhaler Inhale 2 puffs into  the lungs every 4 (four) hours as needed for wheezing or shortness of breath. Reported on 06/12/2016     bisoprolol  (ZEBETA ) 10 MG tablet Take 1 tablet (10 mg total) by mouth daily.     bumetanide  (BUMEX ) 1 MG tablet Take 2 mg by mouth daily.     cephALEXin  (KEFLEX ) 500 MG capsule Take 1 capsule (500 mg total) by mouth 2 (two) times daily. 14 capsule 0   cholecalciferol  (VITAMIN D ) 1000 units tablet Take 1,000 Units by mouth daily.      diclofenac  Sodium (VOLTAREN ) 1 % GEL Apply 4 g topically 4 (four) times daily. 100 g 0   DULoxetine  (CYMBALTA ) 60 MG capsule Take 60 mg by mouth daily.     gabapentin  (NEURONTIN ) 300 MG capsule Take 300 mg by mouth at bedtime.     insulin  aspart (NOVOLOG ) 100 UNIT/ML injection Inject 0-5 Units into the skin at bedtime. Correction coverage: HS scale CBG < 70: implement hypoglycemia protocol CBG 70 - 120: 0 units CBG 121 - 150: 0 units CBG 151 - 200: 0 units CBG 201 - 250: 2 units CBG 251 - 300: 3 units CBG 301 - 350: 4 units CBG 351 - 400: 5 units CBG > 400 call MD.     insulin  aspart (NOVOLOG ) 100 UNIT/ML injection Inject 0-20 Units into the skin 3 (three) times daily with meals. Correction coverage: Resistant (obese,  steroids) CBG < 70: Implement Hypoglycemia protocol. CBG 70 - 120: 0 units CBG 121 - 150: 3 units CBG 151 - 200: 4 units CBG 201 - 250: 7 units CBG 251 - 300: 11 units CBG 301 - 350: 15 units CBG 351 - 400: 20 units CBG > 400 call MD.     insulin  glargine (LANTUS ) 100 UNIT/ML injection Inject 0.54 mLs (54 Units total) into the skin daily. 10 mL 11   pantoprazole  (PROTONIX ) 20 MG tablet Take 20 mg by mouth daily.     rosuvastatin  (CRESTOR ) 10 MG tablet Take 10 mg by mouth daily.     senna-docusate (SENOKOT-S) 8.6-50 MG tablet Take 1 tablet by mouth at bedtime as needed for mild constipation.       Discharge Medications: Please see discharge summary for a list of discharge medications.  Relevant Imaging Results:  Relevant Lab Results:   Additional Information SS#: [id number]  Niels LITTIE Portugal, LCSW

## 2024-08-02 NOTE — ED Notes (Signed)
 PT at bedside to eval pt, pt needs to use bathroom, privacy screen and bedside commode acquired

## 2024-08-02 NOTE — ED Notes (Addendum)
 This RN assisted transport service staff with helping pt into WC. Pt left side sore and was moderate to full assist to sit on edge of bed. Pt can bear weight and pivot for transfer. Pt transported on 4LNC  Called report to [facility] RN  Confirmed with Niels ORN with SW that plan for pt to go to [facility] then to [facility].   Spoke to Lafitte NP pt provider at [facility] and made note of transport and printed out AVS for pt to take to [facility]

## 2024-08-02 NOTE — ED Notes (Signed)
 Pt verbalizes understanding of DC instructions. Pt belongings returned and is assisted in [facility] out of ED with [address] transport

## 2024-08-02 NOTE — ED Notes (Signed)
 Pt assisted to more comfortable position in bed

## 2024-09-28 ENCOUNTER — Emergency Department (HOSPITAL_COMMUNITY)
Admission: EM | Admit: 2024-09-28 | Discharge: 2024-09-29 | Disposition: A | Discharge status: Home / Self Care | Payer: Medicare (Managed Care) | Attending: Emergency Medicine | Admitting: Emergency Medicine

## 2024-09-28 ENCOUNTER — Encounter (HOSPITAL_COMMUNITY): Payer: Self-pay

## 2024-09-28 ENCOUNTER — Emergency Department (HOSPITAL_COMMUNITY): Payer: Medicare (Managed Care)

## 2024-09-28 ENCOUNTER — Other Ambulatory Visit: Payer: Self-pay

## 2024-09-28 DIAGNOSIS — J449 Chronic obstructive pulmonary disease, unspecified: Secondary | ICD-10-CM | POA: Insufficient documentation

## 2024-09-28 DIAGNOSIS — E785 Hyperlipidemia, unspecified: Secondary | ICD-10-CM | POA: Diagnosis not present

## 2024-09-28 DIAGNOSIS — Z9981 Dependence on supplemental oxygen: Secondary | ICD-10-CM | POA: Insufficient documentation

## 2024-09-28 DIAGNOSIS — M85872 Other specified disorders of bone density and structure, left ankle and foot: Secondary | ICD-10-CM | POA: Diagnosis not present

## 2024-09-28 DIAGNOSIS — S92355A Nondisplaced fracture of fifth metatarsal bone, left foot, initial encounter for closed fracture: Secondary | ICD-10-CM | POA: Insufficient documentation

## 2024-09-28 DIAGNOSIS — S99922A Unspecified injury of left foot, initial encounter: Secondary | ICD-10-CM | POA: Diagnosis present

## 2024-09-28 DIAGNOSIS — I110 Hypertensive heart disease with heart failure: Secondary | ICD-10-CM | POA: Diagnosis not present

## 2024-09-28 DIAGNOSIS — Z993 Dependence on wheelchair: Secondary | ICD-10-CM | POA: Diagnosis not present

## 2024-09-28 DIAGNOSIS — M19072 Primary osteoarthritis, left ankle and foot: Secondary | ICD-10-CM | POA: Insufficient documentation

## 2024-09-28 DIAGNOSIS — W06XXXA Fall from bed, initial encounter: Secondary | ICD-10-CM | POA: Insufficient documentation

## 2024-09-28 DIAGNOSIS — I5030 Unspecified diastolic (congestive) heart failure: Secondary | ICD-10-CM | POA: Insufficient documentation

## 2024-09-28 DIAGNOSIS — Z8673 Personal history of transient ischemic attack (TIA), and cerebral infarction without residual deficits: Secondary | ICD-10-CM | POA: Insufficient documentation

## 2024-09-28 DIAGNOSIS — Z7901 Long term (current) use of anticoagulants: Secondary | ICD-10-CM | POA: Insufficient documentation

## 2024-09-28 DIAGNOSIS — Z794 Long term (current) use of insulin: Secondary | ICD-10-CM | POA: Diagnosis not present

## 2024-09-28 DIAGNOSIS — Z79899 Other long term (current) drug therapy: Secondary | ICD-10-CM | POA: Insufficient documentation

## 2024-09-28 DIAGNOSIS — E119 Type 2 diabetes mellitus without complications: Secondary | ICD-10-CM | POA: Insufficient documentation

## 2024-09-28 DIAGNOSIS — S0990XA Unspecified injury of head, initial encounter: Secondary | ICD-10-CM | POA: Diagnosis present

## 2024-09-28 DIAGNOSIS — Z91040 Latex allergy status: Secondary | ICD-10-CM | POA: Insufficient documentation

## 2024-09-28 NOTE — ED Triage Notes (Signed)
 Patient bib [facility] from home after she had a fall while getting out of bed. EMS states that her left ankle and foot hurt. She denies hitting head and LOC.  Patient takes blood thinner. She states she takes blood thinners for heart problems and previous strokes.

## 2024-09-29 ENCOUNTER — Emergency Department (HOSPITAL_COMMUNITY): Payer: Medicare (Managed Care)

## 2024-09-29 MED ORDER — OXYCODONE-ACETAMINOPHEN 5-325 MG PO TABS
1.0000 | ORAL_TABLET | Freq: Once | ORAL | Status: AC
  Administered 2024-09-29: 1 via ORAL
  Filled 2024-09-29: qty 1

## 2024-09-29 MED ORDER — OXYCODONE HCL 5 MG PO TABS: 5.0000 mg | ORAL_TABLET | ORAL | 0 refills | Status: AC | PRN

## 2024-09-29 NOTE — Discharge Instructions (Addendum)
 Apply ice for 30 minutes at a time, 4 times a day.  Keep your foot elevated is much as possible.  You may take acetaminophen  as needed for pain.  For additional pain relief, you may take oxycodone .  The combination of oxycodone  and acetaminophen  gives you better pain relief than either medication by itself.  Please make sure to follow-up with the orthopedic specialist.  Call in the morning for an appointment.

## 2024-09-29 NOTE — ED Notes (Signed)
 Called [facility] for transport. Transport arranged, no ETA provided

## 2024-09-29 NOTE — ED Provider Notes (Signed)
 [place] EMERGENCY DEPARTMENT AT [facility] Provider Note   CSN: [id number] Arrival date & time: 09/28/24  2127     Patient presents with: Fall and Ankle Pain   Margaret [name] is a 68 y.o. female.   The history is provided by the patient.  Fall  Ankle Pain  She has history of hypertension, diabetes, hyperlipidemia, COPD on chronic home oxygen, stroke, diastolic heart failure, chronic anticoagulation with apixaban  and comes in because of a fall.  She states she fell out of bed and her left foot got twisted and she is complaining of pain in her left foot and ankle.  She denies hitting her head, denies other injury.  Of note, she is wheelchair-bound.    Prior to Admission medications   Medication Sig Start Date End Date Taking? Authorizing Provider  acetaminophen  (TYLENOL ) 325 MG tablet Take 2 tablets (650 mg total) by mouth every 4 (four) hours as needed for mild pain, headache or fever. 11/27/20   Danton Reyes DASEN, MD  albuterol  (PROVENTIL  HFA;VENTOLIN  HFA) 108 (90 BASE) MCG/ACT inhaler Inhale 2 puffs into the lungs every 4 (four) hours as needed for wheezing or shortness of breath. Reported on 06/12/2016 04/30/12   Raenelle Donalda HERO, MD  apixaban  (ELIQUIS ) 2.5 MG TABS tablet Take 2.5 mg by mouth 2 (two) times daily.    [provider]  bisoprolol  (ZEBETA ) 10 MG tablet Take 1 tablet (10 mg total) by mouth daily. 11/28/20   Danton Reyes DASEN, MD  bumetanide  (BUMEX ) 1 MG tablet Take 2 mg by mouth daily.    [provider]  cephALEXin  (KEFLEX ) 500 MG capsule Take 1 capsule (500 mg total) by mouth 2 (two) times daily. 05/08/24   Vicky Charleston, PA-C  cholecalciferol  (VITAMIN D ) 1000 units tablet Take 1,000 Units by mouth daily.     [provider]  diclofenac  Sodium (VOLTAREN ) 1 % GEL Apply 4 g topically 4 (four) times daily. 08/11/22   Floyd, Dan, DO  DULoxetine  (CYMBALTA ) 60 MG capsule Take 60 mg by mouth daily.    [provider]   gabapentin  (NEURONTIN ) 300 MG capsule Take 300 mg by mouth at bedtime.    [provider]  insulin  aspart (NOVOLOG ) 100 UNIT/ML injection Inject 0-5 Units into the skin at bedtime. Correction coverage: HS scale CBG < 70: implement hypoglycemia protocol CBG 70 - 120: 0 units CBG 121 - 150: 0 units CBG 151 - 200: 0 units CBG 201 - 250: 2 units CBG 251 - 300: 3 units CBG 301 - 350: 4 units CBG 351 - 400: 5 units CBG > 400 call MD. 11/28/20   Hongalgi, Anand D, MD  insulin  aspart (NOVOLOG ) 100 UNIT/ML injection Inject 0-20 Units into the skin 3 (three) times daily with meals. Correction coverage: Resistant (obese, steroids) CBG < 70: Implement Hypoglycemia protocol. CBG 70 - 120: 0 units CBG 121 - 150: 3 units CBG 151 - 200: 4 units CBG 201 - 250: 7 units CBG 251 - 300: 11 units CBG 301 - 350: 15 units CBG 351 - 400: 20 units CBG > 400 call MD. 11/28/20   Hongalgi, Anand D, MD  insulin  glargine (LANTUS ) 100 UNIT/ML injection Inject 0.54 mLs (54 Units total) into the skin daily. 11/28/20   Danton Reyes DASEN, MD  pantoprazole  (PROTONIX ) 20 MG tablet Take 20 mg by mouth daily.    [provider]  rosuvastatin  (CRESTOR ) 10 MG tablet Take 10 mg by mouth daily.    [provider]  senna-docusate (SENOKOT-S) 8.6-50 MG tablet Take 1 tablet by mouth at bedtime as needed for mild constipation. 11/27/20   Danton Reyes DASEN, MD    Allergies: Codeine, Enoxaparin , Glimepiride, Lisinopril, Rivaroxaban , Semaglutide, and Latex    Review of Systems  All other systems reviewed and are negative.   Updated Vital Signs BP (!) 182/76 (BP Location: Right Arm)   Pulse 75   Temp 97.7 F (36.5 C)   Resp 19   Ht 5' 4 (1.626 m)   Wt 112.9 kg   SpO2 98%   BMI 42.74 kg/m   Physical Exam Vitals and nursing note reviewed.   68 year old female, resting comfortably and in no acute distress. Vital signs are significant for elevated blood pressure. Oxygen saturation is 98%, which is  normal. Head is normocephalic and atraumatic. PERRLA, EOMI. Oropharynx is clear. Neck is nontender. Back is nontender. Lungs are clear without rales, wheezes, or rhonchi. Chest is nontender. Heart has regular rate and rhythm without murmur. Abdomen is soft, flat, nontender. Extremities: Both legs are wrapped in an Una boot which is not removed.  There is tenderness to palpation rather diffusely in her left foot and ankle with maximum tenderness over the fifth metatarsal area.  There is pain on any passive movement.  Distal neurovascular exam is intact with normal sensation and prompt capillary refill.  Full passive range of motion present all other joints without pain. Skin is warm and dry without rash. Neurologic: Awake and alert.  (all labs ordered are listed, but only abnormal results are displayed) Labs Reviewed - No data to display  EKG: None  Radiology: DG Foot Complete Left Result Date: 09/28/2024 CLINICAL DATA:  Clemens, pain EXAM: LEFT FOOT - COMPLETE 3+ VIEW; LEFT ANKLE COMPLETE - 3+ VIEW COMPARISON:  None Available. FINDINGS: Left ankle: Frontal, oblique, and lateral views are obtained. The bones are severely osteopenic. No fracture, subluxation, or dislocation. Osteoarthritis throughout the hindfoot. Diffuse soft tissue swelling. Left foot: Frontal, oblique, and lateral views are obtained. The bones are severely osteopenic. There is an oblique lucency through the fifth metatarsal diaphysis compatible with a nondisplaced fracture. No other acute fracture, subluxation, or dislocation. Mild diffuse osteoarthritis. There is diffuse soft tissue swelling. IMPRESSION: 1. Suspected nondisplaced fifth metatarsal diaphyseal fracture. 2. Severe osteopenia. 3. Multifocal osteoarthritis. 4. Diffuse soft tissue swelling. Electronically Signed   By: Ozell Daring M.D.   On: 09/28/2024 23:36   DG Ankle Complete Left Result Date: 09/28/2024 CLINICAL DATA:  Clemens, pain EXAM: LEFT FOOT - COMPLETE 3+  VIEW; LEFT ANKLE COMPLETE - 3+ VIEW COMPARISON:  None Available. FINDINGS: Left ankle: Frontal, oblique, and lateral views are obtained. The bones are severely osteopenic. No fracture, subluxation, or dislocation. Osteoarthritis throughout the hindfoot. Diffuse soft tissue swelling. Left foot: Frontal, oblique, and lateral views are obtained. The bones are severely osteopenic. There is an oblique lucency through the fifth metatarsal diaphysis compatible with a nondisplaced fracture. No other acute fracture, subluxation, or dislocation. Mild diffuse osteoarthritis. There is diffuse soft tissue swelling. IMPRESSION: 1. Suspected nondisplaced fifth metatarsal diaphyseal fracture. 2. Severe osteopenia. 3. Multifocal osteoarthritis. 4. Diffuse soft tissue swelling. Electronically Signed   By: Ozell Daring M.D.   On: 09/28/2024 23:36     .Ortho Injury Treatment  Date/Time: 09/29/2024 1:05 AM  Performed by: Raford Lenis, MD Authorized by: Raford Lenis, MD   Consent:    Consent obtained:  Verbal   Consent given by:  Patient   Risks discussed:  Fracture   Alternatives discussed:  No treatmentInjury location: foot Location details: left foot Injury type: fracture Fracture type: fifth metatarsal Pre-procedure distal perfusion: normal Pre-procedure neurological function: normal Pre-procedure range of motion: reduced  Anesthesia: Local anesthesia used: no  Patient sedated: NoManipulation performed: no Immobilization: Cam boot. Splint Applied by: Kemp Balm Supplies used: Cam boot. Post-procedure neurovascular assessment: post-procedure neurovascularly intact Post-procedure distal perfusion: normal Post-procedure neurological function: normal Post-procedure range of motion: unchanged      Medications Ordered in the ED  oxyCODONE -acetaminophen  (PERCOCET/ROXICET) 5-325 MG per tablet 1 tablet (1 tablet Oral Given 09/29/24 0154)                                    Medical Decision  Making Amount and/or Complexity of Data Reviewed Radiology: ordered.  Risk Prescription drug management.   Fall with injury to left foot and ankle, patient with chronic anticoagulation.  X-rays of left foot and ankle appear to show nondisplaced fracture of the fifth metatarsal diaphysis.  I have independently reviewed all of the images, and agree with the radiologist's interpretation.  Because of fall while on anticoagulation, I have also ordered CT scans of head and cervical spine.  I have ordered a cam boot for immobilization.  CT head and cervical spine showed no acute injury.  Have independently viewed the images, and agree with the radiologist's interpretation.  Patient is complaining of increased pain in the cam boot.  Since she is not ambulatory at baseline, I have told her that she can try keeping her foot out of the cam boot.  I recommended she apply ice, take acetaminophen  as needed for pain.  I am discharging her with a prescription for oxycodone .  I am referring her to orthopedics for follow-up.     Final diagnoses:  Fall from bed, initial encounter  Nondisplaced fracture of fifth metatarsal bone, left foot, initial encounter for closed fracture  Chronic anticoagulation    ED Discharge Orders          Ordered    oxyCODONE  (ROXICODONE ) 5 MG immediate release tablet  Every 4 hours PRN        09/29/24 0218               Raford Lenis, MD 09/29/24 [phone]

## 2024-09-29 NOTE — Progress Notes (Signed)
 Orthopedic Tech Progress Note Patient Details:  Margaret [name] 1956-08-22 [id number] Severe osteopenia of the foot. Could not tolerate applying the boot onto her foot. I could not fully place the front border onto the foot because it caused too much pain but eventually got the boot on. Ortho Devices Type of Ortho Device: CAM walker Ortho Device/Splint Location: L ANKLE Ortho Device/Splint Interventions: Ordered, Application   Post Interventions Patient Tolerated: Poor Instructions Provided: Care of device  Etana Beets L Robbin Loughmiller 09/29/2024, 1:55 AM
